# Patient Record
Sex: Male | Born: 1955
Health system: Southern US, Community
[De-identification: ages and names within clinical notes are randomized; demographics above are authoritative.]

## PROBLEM LIST (undated history)

## (undated) DIAGNOSIS — I723 Aneurysm of iliac artery: Secondary | ICD-10-CM

## (undated) DIAGNOSIS — K760 Fatty (change of) liver, not elsewhere classified: Secondary | ICD-10-CM

## (undated) DIAGNOSIS — R200 Anesthesia of skin: Secondary | ICD-10-CM

## (undated) DIAGNOSIS — R000 Tachycardia, unspecified: Secondary | ICD-10-CM

## (undated) DIAGNOSIS — K219 Gastro-esophageal reflux disease without esophagitis: Secondary | ICD-10-CM

## (undated) DIAGNOSIS — M069 Rheumatoid arthritis, unspecified: Secondary | ICD-10-CM

## (undated) DIAGNOSIS — M7120 Synovial cyst of popliteal space [Baker], unspecified knee: Secondary | ICD-10-CM

## (undated) DIAGNOSIS — T402X5A Adverse effect of other opioids, initial encounter: Secondary | ICD-10-CM

## (undated) DIAGNOSIS — G709 Myoneural disorder, unspecified: Secondary | ICD-10-CM

## (undated) DIAGNOSIS — I509 Heart failure, unspecified: Secondary | ICD-10-CM

## (undated) DIAGNOSIS — I739 Peripheral vascular disease, unspecified: Secondary | ICD-10-CM

## (undated) DIAGNOSIS — I255 Ischemic cardiomyopathy: Secondary | ICD-10-CM

## (undated) DIAGNOSIS — K5903 Drug induced constipation: Secondary | ICD-10-CM

## (undated) DIAGNOSIS — K5900 Constipation, unspecified: Secondary | ICD-10-CM

## (undated) DIAGNOSIS — I10 Essential (primary) hypertension: Secondary | ICD-10-CM

## (undated) DIAGNOSIS — I2510 Atherosclerotic heart disease of native coronary artery without angina pectoris: Secondary | ICD-10-CM

## (undated) DIAGNOSIS — I219 Acute myocardial infarction, unspecified: Secondary | ICD-10-CM

## (undated) DIAGNOSIS — E119 Type 2 diabetes mellitus without complications: Secondary | ICD-10-CM

## (undated) DIAGNOSIS — E785 Hyperlipidemia, unspecified: Secondary | ICD-10-CM

## (undated) DIAGNOSIS — M549 Dorsalgia, unspecified: Secondary | ICD-10-CM

## (undated) DIAGNOSIS — I5042 Chronic combined systolic (congestive) and diastolic (congestive) heart failure: Secondary | ICD-10-CM

## (undated) DIAGNOSIS — G8929 Other chronic pain: Secondary | ICD-10-CM

## (undated) DIAGNOSIS — R202 Paresthesia of skin: Secondary | ICD-10-CM

## (undated) HISTORY — DX: Essential (primary) hypertension: I10

## (undated) HISTORY — PX: WISDOM TOOTH EXTRACTION: SHX21

## (undated) HISTORY — PX: TONSILLECTOMY: SUR1361

## (undated) HISTORY — DX: Gastro-esophageal reflux disease without esophagitis: K21.9

## (undated) HISTORY — DX: Ischemic cardiomyopathy: I25.5

## (undated) HISTORY — PX: BACK SURGERY: SHX140

## (undated) HISTORY — PX: LUMBAR SPINE SURGERY: SHX701

## (undated) HISTORY — DX: Hyperlipidemia, unspecified: E78.5

## (undated) HISTORY — DX: Peripheral vascular disease, unspecified: I73.9

## (undated) HISTORY — PX: OTHER SURGICAL HISTORY: SHX169

## (undated) HISTORY — PX: POPLITEAL SYNOVIAL CYST EXCISION: SUR555

## (undated) HISTORY — PX: JOINT REPLACEMENT: SHX530

## (undated) SURGERY — Surgical Case
Anesthesia: *Unknown

---

## 2005-07-07 ENCOUNTER — Ambulatory Visit: Payer: Self-pay | Admitting: Family Medicine

## 2005-07-07 ENCOUNTER — Ambulatory Visit: Payer: Self-pay | Admitting: *Deleted

## 2008-01-03 ENCOUNTER — Emergency Department (HOSPITAL_COMMUNITY): Admission: EM | Admit: 2008-01-03 | Discharge: 2008-01-03 | Payer: Self-pay | Admitting: Emergency Medicine

## 2008-05-12 ENCOUNTER — Encounter (INDEPENDENT_AMBULATORY_CARE_PROVIDER_SITE_OTHER): Payer: Self-pay | Admitting: Adult Health

## 2008-05-12 ENCOUNTER — Ambulatory Visit: Payer: Self-pay | Admitting: Internal Medicine

## 2008-05-12 LAB — CONVERTED CEMR LAB
ALT: 30 units/L (ref 0–53)
AST: 24 units/L (ref 0–37)
Albumin: 4.9 g/dL (ref 3.5–5.2)
Alkaline Phosphatase: 56 units/L (ref 39–117)
BUN: 23 mg/dL (ref 6–23)
Basophils Absolute: 0 10*3/uL (ref 0.0–0.1)
Basophils Relative: 0 % (ref 0–1)
CO2: 24 meq/L (ref 19–32)
Calcium: 9.4 mg/dL (ref 8.4–10.5)
Chloride: 105 meq/L (ref 96–112)
Creatinine, Ser: 1.23 mg/dL (ref 0.40–1.50)
Eosinophils Absolute: 0.3 10*3/uL (ref 0.0–0.7)
Eosinophils Relative: 4 % (ref 0–5)
Glucose, Bld: 80 mg/dL (ref 70–99)
HCT: 41.4 % (ref 39.0–52.0)
Hemoglobin: 13.5 g/dL (ref 13.0–17.0)
Lymphocytes Relative: 35 % (ref 12–46)
Lymphs Abs: 2.3 10*3/uL (ref 0.7–4.0)
MCHC: 32.6 g/dL (ref 30.0–36.0)
MCV: 83.3 fL (ref 78.0–100.0)
Monocytes Absolute: 0.6 10*3/uL (ref 0.1–1.0)
Monocytes Relative: 8 % (ref 3–12)
Neutro Abs: 3.5 10*3/uL (ref 1.7–7.7)
Neutrophils Relative %: 52 % (ref 43–77)
PSA: 0.37 ng/mL (ref 0.10–4.00)
Platelets: 284 10*3/uL (ref 150–400)
Potassium: 3.9 meq/L (ref 3.5–5.3)
RBC: 4.97 M/uL (ref 4.22–5.81)
RDW: 15.4 % (ref 11.5–15.5)
Sodium: 140 meq/L (ref 135–145)
Total Bilirubin: 0.5 mg/dL (ref 0.3–1.2)
Total Protein: 7.4 g/dL (ref 6.0–8.3)
WBC: 6.7 10*3/uL (ref 4.0–10.5)

## 2008-05-18 ENCOUNTER — Encounter: Payer: Self-pay | Admitting: Internal Medicine

## 2008-05-18 ENCOUNTER — Ambulatory Visit: Payer: Self-pay | Admitting: Vascular Surgery

## 2008-05-18 ENCOUNTER — Ambulatory Visit (HOSPITAL_COMMUNITY): Admission: RE | Admit: 2008-05-18 | Discharge: 2008-05-18 | Payer: Self-pay | Admitting: Internal Medicine

## 2008-05-22 ENCOUNTER — Ambulatory Visit: Payer: Self-pay | Admitting: Internal Medicine

## 2008-06-12 ENCOUNTER — Ambulatory Visit: Payer: Self-pay | Admitting: Internal Medicine

## 2008-06-12 ENCOUNTER — Ambulatory Visit: Payer: Self-pay | Admitting: Surgery

## 2008-06-19 ENCOUNTER — Ambulatory Visit: Payer: Self-pay | Admitting: Surgery

## 2008-07-03 ENCOUNTER — Ambulatory Visit: Payer: Self-pay | Admitting: Internal Medicine

## 2008-07-25 ENCOUNTER — Ambulatory Visit: Payer: Self-pay | Admitting: Family Medicine

## 2008-07-31 ENCOUNTER — Ambulatory Visit (HOSPITAL_COMMUNITY): Admission: RE | Admit: 2008-07-31 | Discharge: 2008-07-31 | Payer: Self-pay | Admitting: Internal Medicine

## 2008-08-18 ENCOUNTER — Ambulatory Visit: Payer: Self-pay | Admitting: Internal Medicine

## 2008-08-24 ENCOUNTER — Ambulatory Visit (HOSPITAL_COMMUNITY): Admission: RE | Admit: 2008-08-24 | Discharge: 2008-08-24 | Payer: Self-pay | Admitting: Internal Medicine

## 2008-08-30 ENCOUNTER — Ambulatory Visit: Payer: Self-pay | Admitting: Internal Medicine

## 2008-09-19 ENCOUNTER — Emergency Department (HOSPITAL_COMMUNITY): Admission: EM | Admit: 2008-09-19 | Discharge: 2008-09-19 | Payer: Self-pay | Admitting: Emergency Medicine

## 2008-10-13 ENCOUNTER — Emergency Department (HOSPITAL_COMMUNITY): Admission: EM | Admit: 2008-10-13 | Discharge: 2008-10-13 | Payer: Self-pay | Admitting: Emergency Medicine

## 2008-11-02 ENCOUNTER — Emergency Department (HOSPITAL_COMMUNITY): Admission: EM | Admit: 2008-11-02 | Discharge: 2008-11-02 | Payer: Self-pay | Admitting: Emergency Medicine

## 2009-03-05 ENCOUNTER — Ambulatory Visit: Payer: Self-pay | Admitting: Internal Medicine

## 2009-03-05 ENCOUNTER — Inpatient Hospital Stay (HOSPITAL_COMMUNITY): Admission: RE | Admit: 2009-03-05 | Discharge: 2009-03-09 | Payer: Self-pay | Admitting: Neurosurgery

## 2009-03-07 ENCOUNTER — Encounter: Payer: Self-pay | Admitting: Internal Medicine

## 2009-03-09 ENCOUNTER — Encounter (INDEPENDENT_AMBULATORY_CARE_PROVIDER_SITE_OTHER): Payer: Self-pay | Admitting: Neurosurgery

## 2009-03-11 ENCOUNTER — Emergency Department (HOSPITAL_COMMUNITY): Admission: EM | Admit: 2009-03-11 | Discharge: 2009-03-11 | Payer: Self-pay | Admitting: Emergency Medicine

## 2009-05-09 ENCOUNTER — Encounter: Admission: RE | Admit: 2009-05-09 | Discharge: 2009-07-16 | Payer: Self-pay | Admitting: Neurosurgery

## 2009-06-13 ENCOUNTER — Encounter: Admission: RE | Admit: 2009-06-13 | Discharge: 2009-06-13 | Payer: Self-pay | Admitting: Neurosurgery

## 2009-06-25 ENCOUNTER — Ambulatory Visit (HOSPITAL_COMMUNITY): Admission: RE | Admit: 2009-06-25 | Discharge: 2009-06-25 | Payer: Self-pay | Admitting: Neurosurgery

## 2009-06-25 ENCOUNTER — Encounter (INDEPENDENT_AMBULATORY_CARE_PROVIDER_SITE_OTHER): Payer: Self-pay | Admitting: Neurosurgery

## 2009-06-25 ENCOUNTER — Ambulatory Visit: Payer: Self-pay | Admitting: Vascular Surgery

## 2009-07-12 ENCOUNTER — Ambulatory Visit (HOSPITAL_COMMUNITY)
Admission: RE | Admit: 2009-07-12 | Discharge: 2009-07-14 | Payer: Self-pay | Source: Home / Self Care | Admitting: Neurosurgery

## 2009-07-13 ENCOUNTER — Ambulatory Visit: Payer: Self-pay | Admitting: Infectious Diseases

## 2009-08-07 ENCOUNTER — Ambulatory Visit: Payer: Self-pay | Admitting: Internal Medicine

## 2009-08-07 ENCOUNTER — Encounter (INDEPENDENT_AMBULATORY_CARE_PROVIDER_SITE_OTHER): Payer: Self-pay | Admitting: Adult Health

## 2009-08-07 LAB — CONVERTED CEMR LAB: Microalb, Ur: 0.5 mg/dL (ref 0.00–1.89)

## 2009-08-31 ENCOUNTER — Encounter (INDEPENDENT_AMBULATORY_CARE_PROVIDER_SITE_OTHER): Payer: Self-pay | Admitting: Adult Health

## 2009-08-31 ENCOUNTER — Ambulatory Visit: Payer: Self-pay | Admitting: Internal Medicine

## 2009-08-31 LAB — CONVERTED CEMR LAB
ALT: 20 units/L (ref 0–53)
AST: 24 units/L (ref 0–37)
Albumin: 5 g/dL (ref 3.5–5.2)
Alkaline Phosphatase: 72 units/L (ref 39–117)
BUN: 14 mg/dL (ref 6–23)
CO2: 20 meq/L (ref 19–32)
Calcium: 9.5 mg/dL (ref 8.4–10.5)
Chloride: 106 meq/L (ref 96–112)
Cholesterol: 200 mg/dL (ref 0–200)
Creatinine, Ser: 0.95 mg/dL (ref 0.40–1.50)
Glucose, Bld: 126 mg/dL — ABNORMAL HIGH (ref 70–99)
HDL: 47 mg/dL (ref >39)
LDL Cholesterol: 129 mg/dL — ABNORMAL HIGH (ref 0–99)
PSA: 0.37 ng/mL (ref 0.10–4.00)
Potassium: 4.1 meq/L (ref 3.5–5.3)
Sodium: 139 meq/L (ref 135–145)
Total Bilirubin: 0.4 mg/dL (ref 0.3–1.2)
Total CHOL/HDL Ratio: 4.3
Total Protein: 7.5 g/dL (ref 6.0–8.3)
Triglycerides: 122 mg/dL (ref <150)
VLDL: 24 mg/dL (ref 0–40)

## 2010-01-28 ENCOUNTER — Encounter
Admission: RE | Admit: 2010-01-28 | Discharge: 2010-03-26 | Payer: Self-pay | Source: Home / Self Care | Attending: Physical Medicine & Rehabilitation | Admitting: Physical Medicine & Rehabilitation

## 2010-01-28 ENCOUNTER — Ambulatory Visit: Payer: Self-pay | Admitting: Physical Medicine & Rehabilitation

## 2010-02-19 ENCOUNTER — Ambulatory Visit (HOSPITAL_COMMUNITY)
Admission: RE | Admit: 2010-02-19 | Discharge: 2010-02-19 | Payer: Self-pay | Source: Home / Self Care | Attending: Physical Medicine & Rehabilitation | Admitting: Physical Medicine & Rehabilitation

## 2010-02-26 ENCOUNTER — Encounter
Admission: RE | Admit: 2010-02-26 | Discharge: 2010-03-26 | Payer: Self-pay | Source: Home / Self Care | Attending: Physical Medicine & Rehabilitation | Admitting: Physical Medicine & Rehabilitation

## 2010-03-17 ENCOUNTER — Encounter: Payer: Self-pay | Admitting: Physical Medicine & Rehabilitation

## 2010-03-25 ENCOUNTER — Ambulatory Visit: Admit: 2010-03-25 | Payer: Self-pay | Admitting: Physical Medicine & Rehabilitation

## 2010-04-08 ENCOUNTER — Encounter (INDEPENDENT_AMBULATORY_CARE_PROVIDER_SITE_OTHER): Payer: Self-pay | Admitting: *Deleted

## 2010-04-08 LAB — CONVERTED CEMR LAB
ALT: 20 units/L (ref 0–53)
AST: 23 units/L (ref 0–37)
Albumin: 4.5 g/dL (ref 3.5–5.2)
Alkaline Phosphatase: 86 units/L (ref 39–117)
BUN: 19 mg/dL (ref 6–23)
Basophils Absolute: 0 10*3/uL (ref 0.0–0.1)
Basophils Relative: 0 % (ref 0–1)
CO2: 22 meq/L (ref 19–32)
Calcium: 9.4 mg/dL (ref 8.4–10.5)
Chloride: 104 meq/L (ref 96–112)
Creatinine, Ser: 1.24 mg/dL (ref 0.40–1.50)
Eosinophils Absolute: 0.3 10*3/uL (ref 0.0–0.7)
Eosinophils Relative: 3 % (ref 0–5)
Glucose, Bld: 86 mg/dL (ref 70–99)
HCT: 45 % (ref 39.0–52.0)
Hemoglobin: 15.2 g/dL (ref 13.0–17.0)
Hgb A1c MFr Bld: 6.8 % — ABNORMAL HIGH (ref <5.7)
Lymphocytes Relative: 29 % (ref 12–46)
Lymphs Abs: 2.6 10*3/uL (ref 0.7–4.0)
MCHC: 33.8 g/dL (ref 30.0–36.0)
MCV: 80.5 fL (ref 78.0–100.0)
Monocytes Absolute: 0.8 10*3/uL (ref 0.1–1.0)
Monocytes Relative: 9 % (ref 3–12)
Neutro Abs: 5.3 10*3/uL (ref 1.7–7.7)
Neutrophils Relative %: 60 % (ref 43–77)
Platelets: 312 10*3/uL (ref 150–400)
Potassium: 3.8 meq/L (ref 3.5–5.3)
RBC: 5.59 M/uL (ref 4.22–5.81)
RDW: 17 % — ABNORMAL HIGH (ref 11.5–15.5)
Sodium: 140 meq/L (ref 135–145)
TSH: 0.723 microintl units/mL (ref 0.350–4.500)
Total Bilirubin: 0.4 mg/dL (ref 0.3–1.2)
Total Protein: 7.1 g/dL (ref 6.0–8.3)
Vitamin B-12: 542 pg/mL (ref 211–911)
WBC: 8.9 10*3/uL (ref 4.0–10.5)

## 2010-04-24 ENCOUNTER — Ambulatory Visit: Payer: Self-pay

## 2010-05-12 LAB — TSH: TSH: 0.105 u[IU]/mL — ABNORMAL LOW (ref 0.350–4.500)

## 2010-05-12 LAB — CBC
HCT: 27.5 % — ABNORMAL LOW (ref 39.0–52.0)
HCT: 29.4 % — ABNORMAL LOW (ref 39.0–52.0)
HCT: 33 % — ABNORMAL LOW (ref 39.0–52.0)
HCT: 42.5 % (ref 39.0–52.0)
Hemoglobin: 10 g/dL — ABNORMAL LOW (ref 13.0–17.0)
Hemoglobin: 11.2 g/dL — ABNORMAL LOW (ref 13.0–17.0)
Hemoglobin: 14.4 g/dL (ref 13.0–17.0)
Hemoglobin: 9.3 g/dL — ABNORMAL LOW (ref 13.0–17.0)
MCHC: 33.8 g/dL (ref 30.0–36.0)
MCHC: 33.8 g/dL (ref 30.0–36.0)
MCHC: 33.9 g/dL (ref 30.0–36.0)
MCHC: 34 g/dL (ref 30.0–36.0)
MCV: 83.9 fL (ref 78.0–100.0)
MCV: 84.5 fL (ref 78.0–100.0)
MCV: 84.6 fL (ref 78.0–100.0)
MCV: 84.7 fL (ref 78.0–100.0)
Platelets: 164 10*3/uL (ref 150–400)
Platelets: 174 10*3/uL (ref 150–400)
Platelets: 199 10*3/uL (ref 150–400)
Platelets: 269 10*3/uL (ref 150–400)
RBC: 3.24 MIL/uL — ABNORMAL LOW (ref 4.22–5.81)
RBC: 3.5 MIL/uL — ABNORMAL LOW (ref 4.22–5.81)
RBC: 3.9 MIL/uL — ABNORMAL LOW (ref 4.22–5.81)
RBC: 5.03 MIL/uL (ref 4.22–5.81)
RDW: 15.3 % (ref 11.5–15.5)
RDW: 15.3 % (ref 11.5–15.5)
RDW: 15.5 % (ref 11.5–15.5)
RDW: 15.6 % — ABNORMAL HIGH (ref 11.5–15.5)
WBC: 11.1 10*3/uL — ABNORMAL HIGH (ref 4.0–10.5)
WBC: 13.9 10*3/uL — ABNORMAL HIGH (ref 4.0–10.5)
WBC: 15.7 10*3/uL — ABNORMAL HIGH (ref 4.0–10.5)
WBC: 9.8 10*3/uL (ref 4.0–10.5)

## 2010-05-12 LAB — TYPE AND SCREEN
ABO/RH(D): B POS
Antibody Screen: NEGATIVE

## 2010-05-12 LAB — CARDIAC PANEL(CRET KIN+CKTOT+MB+TROPI)
CK, MB: 11.2 ng/mL (ref 0.3–4.0)
CK, MB: 14.1 ng/mL (ref 0.3–4.0)
CK, MB: 18.5 ng/mL (ref 0.3–4.0)
CK, MB: 18.9 ng/mL (ref 0.3–4.0)
CK, MB: 5.1 ng/mL — ABNORMAL HIGH (ref 0.3–4.0)
Relative Index: 0.2 (ref 0.0–2.5)
Relative Index: 0.3 (ref 0.0–2.5)
Relative Index: 0.3 (ref 0.0–2.5)
Relative Index: 0.4 (ref 0.0–2.5)
Relative Index: 0.5 (ref 0.0–2.5)
Total CK: 3003 U/L — ABNORMAL HIGH (ref 7–232)
Total CK: 3538 U/L — ABNORMAL HIGH (ref 7–232)
Total CK: 4158 U/L — ABNORMAL HIGH (ref 7–232)
Total CK: 4259 U/L — ABNORMAL HIGH (ref 7–232)
Total CK: 4365 U/L — ABNORMAL HIGH (ref 7–232)
Troponin I: 0.03 ng/mL (ref 0.00–0.06)
Troponin I: 0.04 ng/mL (ref 0.00–0.06)
Troponin I: 0.06 ng/mL (ref 0.00–0.06)
Troponin I: 0.08 ng/mL — ABNORMAL HIGH (ref 0.00–0.06)
Troponin I: 0.09 ng/mL — ABNORMAL HIGH (ref 0.00–0.06)

## 2010-05-12 LAB — BASIC METABOLIC PANEL
BUN: 11 mg/dL (ref 6–23)
BUN: 14 mg/dL (ref 6–23)
BUN: 14 mg/dL (ref 6–23)
BUN: 6 mg/dL (ref 6–23)
CO2: 24 mEq/L (ref 19–32)
CO2: 28 mEq/L (ref 19–32)
CO2: 31 mEq/L (ref 19–32)
CO2: 31 mEq/L (ref 19–32)
Calcium: 10 mg/dL (ref 8.4–10.5)
Calcium: 8.4 mg/dL (ref 8.4–10.5)
Calcium: 8.4 mg/dL (ref 8.4–10.5)
Calcium: 8.8 mg/dL (ref 8.4–10.5)
Chloride: 100 mEq/L (ref 96–112)
Chloride: 101 mEq/L (ref 96–112)
Chloride: 103 mEq/L (ref 96–112)
Chloride: 104 mEq/L (ref 96–112)
Creatinine, Ser: 1.02 mg/dL (ref 0.4–1.5)
Creatinine, Ser: 1.07 mg/dL (ref 0.4–1.5)
Creatinine, Ser: 1.36 mg/dL (ref 0.4–1.5)
Creatinine, Ser: 1.49 mg/dL (ref 0.4–1.5)
GFR calc Af Amer: 60 mL/min — ABNORMAL LOW (ref >60)
GFR calc Af Amer: >60 mL/min (ref >60)
GFR calc Af Amer: >60 mL/min (ref >60)
GFR calc Af Amer: >60 mL/min (ref >60)
GFR calc non Af Amer: 49 mL/min — ABNORMAL LOW (ref >60)
GFR calc non Af Amer: 55 mL/min — ABNORMAL LOW (ref >60)
GFR calc non Af Amer: >60 mL/min (ref >60)
GFR calc non Af Amer: >60 mL/min (ref >60)
Glucose, Bld: 116 mg/dL — ABNORMAL HIGH (ref 70–99)
Glucose, Bld: 127 mg/dL — ABNORMAL HIGH (ref 70–99)
Glucose, Bld: 150 mg/dL — ABNORMAL HIGH (ref 70–99)
Glucose, Bld: 153 mg/dL — ABNORMAL HIGH (ref 70–99)
Potassium: 4 mEq/L (ref 3.5–5.1)
Potassium: 4.2 mEq/L (ref 3.5–5.1)
Potassium: 4.4 mEq/L (ref 3.5–5.1)
Potassium: 4.4 mEq/L (ref 3.5–5.1)
Sodium: 137 mEq/L (ref 135–145)
Sodium: 137 mEq/L (ref 135–145)
Sodium: 138 mEq/L (ref 135–145)
Sodium: 139 mEq/L (ref 135–145)

## 2010-05-12 LAB — DIFFERENTIAL
Basophils Absolute: 0 10*3/uL (ref 0.0–0.1)
Basophils Relative: 0 % (ref 0–1)
Eosinophils Absolute: 0.2 10*3/uL (ref 0.0–0.7)
Eosinophils Relative: 1 % (ref 0–5)
Lymphocytes Relative: 8 % — ABNORMAL LOW (ref 12–46)
Lymphs Abs: 1 10*3/uL (ref 0.7–4.0)
Monocytes Absolute: 0.9 10*3/uL (ref 0.1–1.0)
Monocytes Relative: 7 % (ref 3–12)
Neutro Abs: 11.8 10*3/uL — ABNORMAL HIGH (ref 1.7–7.7)
Neutrophils Relative %: 85 % — ABNORMAL HIGH (ref 43–77)

## 2010-05-12 LAB — URINALYSIS, ROUTINE W REFLEX MICROSCOPIC
Bilirubin Urine: NEGATIVE
Glucose, UA: NEGATIVE mg/dL
Hgb urine dipstick: NEGATIVE
Ketones, ur: NEGATIVE mg/dL
Nitrite: NEGATIVE
Protein, ur: NEGATIVE mg/dL
Specific Gravity, Urine: 1.014 (ref 1.005–1.030)
Urobilinogen, UA: 1 mg/dL (ref 0.0–1.0)
pH: 8 (ref 5.0–8.0)

## 2010-05-12 LAB — T4, FREE: Free T4: 1.25 ng/dL (ref 0.80–1.80)

## 2010-05-12 LAB — ABO/RH: ABO/RH(D): B POS

## 2010-05-13 LAB — ANAEROBIC CULTURE

## 2010-05-13 LAB — WOUND CULTURE: Culture: NO GROWTH

## 2010-05-13 LAB — SEDIMENTATION RATE: Sed Rate: 6 mm/hr (ref 0–16)

## 2010-05-14 LAB — CBC
HCT: 41.6 % (ref 39.0–52.0)
Hemoglobin: 13.9 g/dL (ref 13.0–17.0)
MCHC: 33.4 g/dL (ref 30.0–36.0)
MCV: 80.6 fL (ref 78.0–100.0)
Platelets: 279 10*3/uL (ref 150–400)
RBC: 5.17 MIL/uL (ref 4.22–5.81)
RDW: 17 % — ABNORMAL HIGH (ref 11.5–15.5)
WBC: 8.9 10*3/uL (ref 4.0–10.5)

## 2010-05-14 LAB — BASIC METABOLIC PANEL
BUN: 16 mg/dL (ref 6–23)
CO2: 24 mEq/L (ref 19–32)
Calcium: 10.2 mg/dL (ref 8.4–10.5)
Chloride: 105 mEq/L (ref 96–112)
Creatinine, Ser: 1.02 mg/dL (ref 0.4–1.5)
GFR calc Af Amer: >60 mL/min (ref >60)
GFR calc non Af Amer: >60 mL/min (ref >60)
Glucose, Bld: 110 mg/dL — ABNORMAL HIGH (ref 70–99)
Potassium: 4.1 mEq/L (ref 3.5–5.1)
Sodium: 138 mEq/L (ref 135–145)

## 2010-05-14 LAB — SURGICAL PCR SCREEN
MRSA, PCR: NEGATIVE
Staphylococcus aureus: NEGATIVE

## 2010-05-21 ENCOUNTER — Ambulatory Visit: Payer: Medicaid Other | Admitting: Physical Medicine & Rehabilitation

## 2010-05-21 ENCOUNTER — Encounter: Payer: Medicaid Other | Attending: Physical Medicine & Rehabilitation

## 2010-05-22 ENCOUNTER — Encounter: Payer: Medicaid Other | Attending: Family Medicine

## 2010-05-22 DIAGNOSIS — E119 Type 2 diabetes mellitus without complications: Secondary | ICD-10-CM | POA: Insufficient documentation

## 2010-05-22 DIAGNOSIS — Z713 Dietary counseling and surveillance: Secondary | ICD-10-CM | POA: Insufficient documentation

## 2010-05-28 ENCOUNTER — Ambulatory Visit: Payer: Self-pay | Admitting: Physical Medicine & Rehabilitation

## 2010-06-02 LAB — URINALYSIS, ROUTINE W REFLEX MICROSCOPIC
Bilirubin Urine: NEGATIVE
Glucose, UA: NEGATIVE mg/dL
Ketones, ur: NEGATIVE mg/dL
Leukocytes, UA: NEGATIVE
Nitrite: NEGATIVE
Protein, ur: NEGATIVE mg/dL
Specific Gravity, Urine: 1.022 (ref 1.005–1.030)
Urobilinogen, UA: 0.2 mg/dL (ref 0.0–1.0)
pH: 6 (ref 5.0–8.0)

## 2010-06-02 LAB — URINE MICROSCOPIC-ADD ON

## 2010-06-06 ENCOUNTER — Encounter: Payer: Medicaid Other | Attending: Family Medicine

## 2010-06-06 DIAGNOSIS — E119 Type 2 diabetes mellitus without complications: Secondary | ICD-10-CM | POA: Insufficient documentation

## 2010-06-06 DIAGNOSIS — Z713 Dietary counseling and surveillance: Secondary | ICD-10-CM | POA: Insufficient documentation

## 2010-06-13 ENCOUNTER — Ambulatory Visit: Payer: Medicaid Other

## 2010-06-21 ENCOUNTER — Ambulatory Visit: Payer: Medicaid Other | Admitting: Physical Medicine & Rehabilitation

## 2010-06-28 ENCOUNTER — Ambulatory Visit: Payer: Medicaid Other | Admitting: Physical Medicine & Rehabilitation

## 2010-06-28 ENCOUNTER — Encounter: Payer: Medicaid Other | Attending: Physical Medicine & Rehabilitation

## 2010-07-09 NOTE — Assessment & Plan Note (Signed)
OFFICE VISIT   Thomas Mcclure, Thomas Mcclure  DOB:  Jan 10, 1956                                       06/12/2008  ZOXWR#:[id number]   REASON FOR VISIT:  Leg pain.   HISTORY:  This is a 55 year old gentleman I am seeing at the request of  Dr. Reche Dixon, at [facility], for evaluation of bilateral lower extremity  leg pain.  The patient states that he suffers from severe bilateral leg  pain that begins on the side of his legs and extends down across his  ankles.  This has been going on for approximately 1 year and has gotten  worse since November.  There is nothing that alleviates his pain, it is  made worse with walking.  He is now unable to work due to his pain.  The  patient's risk factors for arterial insufficiency include hypertension  as well as a history of smoking.   REVIEW OF SYSTEMS:  GENERAL:  Negative for fevers, chills, weight gain,  weight loss.  CARDIAC:  Negative.  PULMONARY:  Negative.  GI:  Positive for reflux.  GU:  Negative.  VASCULAR:  As above.  NEURO:  Negative.  ORTHO:  Positive for joint pain, muscle pain.  PSYCH:  Negative.  ENT:  Negative.  HEME:  Negative.   PAST MEDICAL HISTORY:  Hypertension.   FAMILY HISTORY:  Positive for cardiovascular disease in his mother.   SOCIAL HISTORY:  He is married with 2 children.  He works as a [employer] manager.  Currently smokes about a pack a week, drinks about  a 6-pack a week.   MEDICATIONS:  Hydrochlorothiazide, lisinopril, AcipHex, aspirin and  tramadol.   ALLERGIES:  None.   PHYSICAL EXAMINATION:  Blood pressure is 128/80, pulse is 72.  General:  He is well-appearing, in no distress.  HEENT:  Normocephalic,  atraumatic.  Neck:  Supple, no JVD, no carotid bruits.  Cardiovascular:  Regular rate and rhythm, no murmurs, rubs or gallops.  Pulmonary:  Lungs  are clear bilaterally.  Extremities:  Warm and well perfused.  There are  no ulcerations.  He has palpable right dorsalis pedis and  right  posterior tibial and left posterior tibial pulses.   DIAGNOSTIC STUDIES:  I have reviewed his ultrasound from March 25th at  [facility].  This reveals an ankle brachial index of 1.2 on the right and 1.1  on the left.   ASSESSMENT/PLAN:  Bilateral leg pain.  Plan:  The patient does not have arterial insufficiency.  His symptoms  do not correlate with claudication.  There are some irregular findings  on his ultrasound; however, with palpable pedal pulses and ankle  brachial index greater than 1, arterial insufficiency is not the  etiology of his leg pain.  I have asked him to go back to see his  primary care physician to be evaluated for other potential causes.   Jorge Ny, MD  Electronically Signed   VWB/MEDQ  D:  06/12/2008  T:  06/13/2008  Job:  [id number]   cc:   Dr. Reche Dixon

## 2010-10-07 ENCOUNTER — Emergency Department (HOSPITAL_COMMUNITY)
Admission: EM | Admit: 2010-10-07 | Discharge: 2010-10-07 | Disposition: A | Discharge status: Home / Self Care | Payer: Medicare Other | Attending: Emergency Medicine | Admitting: Emergency Medicine

## 2010-10-07 ENCOUNTER — Encounter (HOSPITAL_COMMUNITY): Payer: Self-pay | Admitting: Radiology

## 2010-10-07 ENCOUNTER — Emergency Department (HOSPITAL_COMMUNITY): Payer: Medicare Other

## 2010-10-07 DIAGNOSIS — R229 Localized swelling, mass and lump, unspecified: Secondary | ICD-10-CM | POA: Insufficient documentation

## 2010-10-07 DIAGNOSIS — L03211 Cellulitis of face: Secondary | ICD-10-CM | POA: Insufficient documentation

## 2010-10-07 DIAGNOSIS — L738 Other specified follicular disorders: Secondary | ICD-10-CM | POA: Insufficient documentation

## 2010-10-07 DIAGNOSIS — R51 Headache: Secondary | ICD-10-CM | POA: Insufficient documentation

## 2010-10-07 DIAGNOSIS — I10 Essential (primary) hypertension: Secondary | ICD-10-CM | POA: Insufficient documentation

## 2010-10-07 DIAGNOSIS — M542 Cervicalgia: Secondary | ICD-10-CM | POA: Insufficient documentation

## 2010-10-07 DIAGNOSIS — L0201 Cutaneous abscess of face: Secondary | ICD-10-CM | POA: Insufficient documentation

## 2010-10-07 DIAGNOSIS — R6884 Jaw pain: Secondary | ICD-10-CM | POA: Insufficient documentation

## 2010-10-07 LAB — POCT I-STAT, CHEM 8
BUN: 13 mg/dL (ref 6–23)
Calcium, Ion: 1.19 mmol/L (ref 1.12–1.32)
Chloride: 104 mEq/L (ref 96–112)
Creatinine, Ser: 1.1 mg/dL (ref 0.50–1.35)
Glucose, Bld: 102 mg/dL — ABNORMAL HIGH (ref 70–99)
HCT: 43 % (ref 39.0–52.0)
Hemoglobin: 14.6 g/dL (ref 13.0–17.0)
Potassium: 3.5 mEq/L (ref 3.5–5.1)
Sodium: 140 mEq/L (ref 135–145)
TCO2: 25 mmol/L (ref 0–100)

## 2010-10-07 MED ORDER — IOHEXOL 300 MG/ML  SOLN: 100.0000 mL | Freq: Once | INTRAMUSCULAR | Status: DC | PRN

## 2010-11-26 LAB — GLUCOSE, CAPILLARY: Glucose-Capillary: 92

## 2011-03-12 ENCOUNTER — Other Ambulatory Visit (HOSPITAL_COMMUNITY): Payer: Self-pay | Admitting: Neurosurgery

## 2011-03-12 DIAGNOSIS — M5410 Radiculopathy, site unspecified: Secondary | ICD-10-CM

## 2011-03-12 DIAGNOSIS — M549 Dorsalgia, unspecified: Secondary | ICD-10-CM

## 2011-03-18 ENCOUNTER — Ambulatory Visit (HOSPITAL_COMMUNITY)
Admission: RE | Admit: 2011-03-18 | Discharge: 2011-03-18 | Disposition: A | Discharge status: Home / Self Care | Payer: Medicare Other | Source: Ambulatory Visit | Attending: Neurosurgery | Admitting: Neurosurgery

## 2011-03-18 DIAGNOSIS — M48061 Spinal stenosis, lumbar region without neurogenic claudication: Secondary | ICD-10-CM | POA: Insufficient documentation

## 2011-03-18 DIAGNOSIS — M5410 Radiculopathy, site unspecified: Secondary | ICD-10-CM

## 2011-03-18 DIAGNOSIS — M549 Dorsalgia, unspecified: Secondary | ICD-10-CM

## 2011-03-18 DIAGNOSIS — M79609 Pain in unspecified limb: Secondary | ICD-10-CM | POA: Insufficient documentation

## 2011-03-18 LAB — BUN: BUN: 15 mg/dL (ref 6–23)

## 2011-03-18 LAB — CREATININE, SERUM
Creatinine, Ser: 1.08 mg/dL (ref 0.50–1.35)
GFR calc Af Amer: 87 mL/min — ABNORMAL LOW (ref >90)
GFR calc non Af Amer: 75 mL/min — ABNORMAL LOW (ref >90)

## 2011-03-18 MED ORDER — GADOBENATE DIMEGLUMINE 529 MG/ML IV SOLN
20.0000 mL | Freq: Once | INTRAVENOUS | Status: AC | PRN
  Administered 2011-03-18: 20 mL via INTRAVENOUS

## 2011-04-25 ENCOUNTER — Other Ambulatory Visit (HOSPITAL_COMMUNITY): Payer: Self-pay | Admitting: Neurosurgery

## 2011-04-25 ENCOUNTER — Other Ambulatory Visit: Payer: Self-pay | Admitting: Neurosurgery

## 2011-04-25 DIAGNOSIS — M5416 Radiculopathy, lumbar region: Secondary | ICD-10-CM

## 2011-05-16 ENCOUNTER — Ambulatory Visit (HOSPITAL_COMMUNITY)
Admission: RE | Admit: 2011-05-16 | Discharge: 2011-05-16 | Disposition: A | Discharge status: Home / Self Care | Payer: Medicare Other | Source: Ambulatory Visit | Attending: Neurosurgery | Admitting: Neurosurgery

## 2011-05-16 DIAGNOSIS — Y838 Other surgical procedures as the cause of abnormal reaction of the patient, or of later complication, without mention of misadventure at the time of the procedure: Secondary | ICD-10-CM | POA: Insufficient documentation

## 2011-05-16 DIAGNOSIS — M5416 Radiculopathy, lumbar region: Secondary | ICD-10-CM

## 2011-05-16 DIAGNOSIS — M48061 Spinal stenosis, lumbar region without neurogenic claudication: Secondary | ICD-10-CM | POA: Insufficient documentation

## 2011-05-16 DIAGNOSIS — T84498A Other mechanical complication of other internal orthopedic devices, implants and grafts, initial encounter: Secondary | ICD-10-CM | POA: Insufficient documentation

## 2011-05-16 MED ORDER — MORPHINE SULFATE 4 MG/ML IJ SOLN: 2.0000 mg | INTRAMUSCULAR | Status: DC | PRN

## 2011-05-16 MED ORDER — IOHEXOL 180 MG/ML  SOLN
20.0000 mL | Freq: Once | INTRAMUSCULAR | Status: AC | PRN
  Administered 2011-05-16: 20 mL via INTRATHECAL

## 2011-05-16 MED ORDER — OXYCODONE-ACETAMINOPHEN 5-325 MG PO TABS
1.0000 | ORAL_TABLET | ORAL | Status: DC | PRN
  Administered 2011-05-16: 2 via ORAL

## 2011-05-16 MED ORDER — DIAZEPAM 5 MG PO TABS
ORAL_TABLET | ORAL | Status: AC
  Filled 2011-05-16: qty 2

## 2011-05-16 MED ORDER — DIAZEPAM 5 MG PO TABS
10.0000 mg | ORAL_TABLET | Freq: Once | ORAL | Status: AC
  Administered 2011-05-16: 10 mg via ORAL

## 2011-05-16 MED ORDER — ONDANSETRON HCL 4 MG/2ML IJ SOLN: 4.0000 mg | Freq: Four times a day (QID) | INTRAMUSCULAR | Status: DC | PRN

## 2011-05-16 MED ORDER — OXYCODONE-ACETAMINOPHEN 5-325 MG PO TABS
ORAL_TABLET | ORAL | Status: AC
  Administered 2011-05-16: 2 via ORAL
  Filled 2011-05-16: qty 2

## 2011-05-16 NOTE — Op Note (Signed)
Brief history: The patient complains of back pain.  Procedure: Lumbar myelogram  Surgeon: Dr. Jeff Boyce Keltner  Asst.: None  Level injected:L5/S1  Dye injected: 15 cc of Omnipaque 180  CSF appearance: Clear  Estimated blood loss minimal  Complications: None    

## 2011-05-16 NOTE — Discharge Instructions (Signed)
Myelography Myelography is an X-ray test that uses a dye to look at your spine or neck. This test is usually done to look for:  Back pain.   Neck pain.   Arm or leg pain, weakness, or numbness.  BEFORE THE PROCEDURE  Take your medicine as told by your doctor.   Eat food and drink fluids as told by your doctor.   Do not drive. Have someone else drive you to the test and drive you home.   Your doctor will talk to you about risks of the myelography and answer questions you may have.  PROCEDURE  You will be awake during the procedure.   You may be asked to lie on your stomach during the procedure.   Your back will be cleaned.   A numbing medicine will be injected into that area.   A small needle is inserted through the skin that was numbed. A dye will be put into the needle.   The needle will be taken out.   X-ray pictures will be taken of your neck or back.   You may also have more procedures to get different views of your neck or back.  AFTER THE PROCEDURE  You will rest in a recovery room until you are stable and doing well.   You will lie down with your head resting on 1 pillow.   You may be given something to eat or drink.   Someone will need to drive you home.   Finding out the results of your test Ask when your test results will be ready. Make sure you get your test results.  Document Released: 11/20/2007 Document Revised: 01/30/2011 Document Reviewed: 11/20/2007 [employer] Patient Information 2012 [employer]. 

## 2011-05-16 NOTE — H&P (Signed)
Subjective: The patient is a 56 year old male who has had a previous [id number] decompression instrumentation and fusion. The patient has developed recurrent back buttocks and leg pain. I discussed further workup with a lumbar myelo CT and the patient elected she with that study.   Past Medical History  Diagnosis Date  . Diabetes mellitus     No past surgical history on file.  Allergies  Allergen Reactions  . Adhesive (Tape) Rash and Other (See Comments)    "blisters"    History  Substance Use Topics  . Smoking status: Not on file  . Smokeless tobacco: Not on file  . Alcohol Use: Not on file    No family history on file. Prior to Admission medications   Medication Sig Start Date End Date Taking? Authorizing Provider  aspirin EC 81 MG tablet Take 81 mg by mouth daily.    Historical Provider, MD  cyclobenzaprine (FLEXERIL) 10 MG tablet Take 10 mg by mouth 3 (three) times daily as needed. For muscle spasm    Historical Provider, MD  diazepam (VALIUM) 5 MG tablet Take 5 mg by mouth 2 (two) times daily.    Historical Provider, MD  DULoxetine (CYMBALTA) 60 MG capsule Take 60 mg by mouth daily.    Historical Provider, MD  hydrochlorothiazide (HYDRODIURIL) 25 MG tablet Take 25 mg by mouth daily.    Historical Provider, MD  lisinopril (PRINIVIL,ZESTRIL) 20 MG tablet Take 20 mg by mouth daily.    Historical Provider, MD  naproxen (NAPROSYN) 500 MG tablet Take 500 mg by mouth 2 (two) times daily with a meal.    Historical Provider, MD  omeprazole (PRILOSEC) 40 MG capsule Take 40 mg by mouth daily.    Historical Provider, MD  pregabalin (LYRICA) 100 MG capsule Take 100 mg by mouth 3 (three) times daily.    Historical Provider, MD  topiramate (TOPAMAX) 50 MG tablet Take 50 mg by mouth 2 (two) times daily as needed. For nerve pain    Historical Provider, MD     Review of Systems  Positive ROS: As above  All other systems have been reviewed and were otherwise negative with the exception of those  mentioned in the HPI and as above.  Objective: Vital signs in last 24 hours: Temp:  [97 F (36.1 C)] 97 F (36.1 C) (03/22 0601) Pulse Rate:  [82] 82  (03/22 0601) Resp:  [18] 18  (03/22 0601) BP: (140)/(92) 140/92 mmHg (03/22 0601) SpO2:  [93 %] 93 % (03/22 0601) Weight:  [95.255 kg (210 lb)] 95.255 kg (210 lb) (03/22 0601)  General Appearance: Alert, cooperative, no distress, appears stated age Head: Normocephalic, without obvious abnormality, atraumatic Eyes: PERRL, conjunctiva/corneas clear, EOM's intact, fundi benign, both eyes      Ears: Normal TM's and external ear canals, both ears Throat: Lips, mucosa, and tongue normal; teeth and gums normal Neck: Supple, symmetrical, trachea midline, no adenopathy; thyroid: No enlargement/tenderness/nodules; no carotid bruit or JVD Back: Symmetric, no curvature, ROM normal, no CVA tenderness Lungs: Clear to auscultation bilaterally, respirations unlabored Heart: Regular rate and rhythm, S1 and S2 normal, no murmur, rub or gallop Abdomen: Soft, non-tender, bowel sounds active all four quadrants, no masses, no organomegaly Extremities: Extremities normal, atraumatic, no cyanosis or edema Pulses: 2+ and symmetric all extremities Skin: Skin color, texture, turgor normal, no rashes or lesions  NEUROLOGIC:   Mental status: alert and oriented, no aphasia, good attention span, Fund of knowledge/ memory ok Motor Exam - grossly normal Sensory Exam -  grossly normal Reflexes:  Coordination - grossly normal Gait - grossly normal Balance - grossly normal Cranial Nerves: I: smell Not tested  II: visual acuity  OS: Normal    OD: Normal   II: visual fields Full to confrontation  II: pupils Equal, round, reactive to light  III,VII: ptosis None  III,IV,VI: extraocular muscles  Full ROM  V: mastication Normal  V: facial light touch sensation  Normal  V,VII: corneal reflex  Present  VII: facial muscle function - upper  Normal  VII: facial muscle  function - lower Normal  VIII: hearing Not tested  IX: soft palate elevation  Normal  IX,X: gag reflex Present  XI: trapezius strength  5/5  XI: sternocleidomastoid strength 5/5  XI: neck flexion strength  5/5  XII: tongue strength  Normal    Data Review Lab Results  Component Value Date   WBC 8.9 04/08/2010   HGB 14.6 10/07/2010   HCT 43.0 10/07/2010   MCV 80.5 04/08/2010   PLT 312 04/08/2010   Lab Results  Component Value Date   NA 140 10/07/2010   K 3.5 10/07/2010   CL 104 10/07/2010   CO2 22 04/08/2010   BUN 15 03/18/2011   CREATININE 1.08 03/18/2011   GLUCOSE 102* 10/07/2010   No results found for this basename: INR, PROTIME    Assessment/Plan: Lumbago, lumbar radiculopathy: I discussed situation with patient. I discussed the procedure of a lumbar mild CT. We discussed the risks, benefits and alternatives. His decided proceed with that procedure.   Festus Pursel D 05/16/2011 7:10 AM

## 2011-05-19 ENCOUNTER — Telehealth (HOSPITAL_COMMUNITY): Payer: Self-pay

## 2011-09-24 ENCOUNTER — Encounter: Payer: Self-pay | Admitting: Internal Medicine

## 2011-10-14 ENCOUNTER — Ambulatory Visit (AMBULATORY_SURGERY_CENTER): Payer: Medicare Other | Admitting: *Deleted

## 2011-10-14 VITALS — Ht 69.0 in | Wt 222.0 lb

## 2011-10-14 DIAGNOSIS — Z1211 Encounter for screening for malignant neoplasm of colon: Secondary | ICD-10-CM

## 2011-10-14 MED ORDER — SUPREP BOWEL PREP KIT 17.5-3.13-1.6 GM/177ML PO SOLN: ORAL | Status: DC

## 2011-10-20 ENCOUNTER — Telehealth: Payer: Self-pay | Admitting: Internal Medicine

## 2011-10-20 NOTE — Telephone Encounter (Signed)
Spoke with patient's pharmacy, they did not deliver suprep because insurance was not going to pay and it cost $171.00. Explained this to patient and he was unable to afford this. Explained to patient we will give him sample, patient to come by office today to get the suprep.

## 2011-10-22 ENCOUNTER — Encounter: Payer: Self-pay | Admitting: Internal Medicine

## 2011-10-22 ENCOUNTER — Ambulatory Visit (AMBULATORY_SURGERY_CENTER): Payer: Medicare Other | Admitting: Internal Medicine

## 2011-10-22 VITALS — BP 165/92 | HR 77 | Temp 97.7°F | Resp 14 | Ht 69.0 in | Wt 222.0 lb

## 2011-10-22 DIAGNOSIS — Z1211 Encounter for screening for malignant neoplasm of colon: Secondary | ICD-10-CM

## 2011-10-22 DIAGNOSIS — D126 Benign neoplasm of colon, unspecified: Secondary | ICD-10-CM

## 2011-10-22 DIAGNOSIS — K635 Polyp of colon: Secondary | ICD-10-CM

## 2011-10-22 MED ORDER — SODIUM CHLORIDE 0.9 % IV SOLN: 500.0000 mL | INTRAVENOUS | Status: DC

## 2011-10-22 NOTE — Op Note (Signed)
[facility] [address] [place], [address]   COLONOSCOPY PROCEDURE REPORT  PATIENT: Thomas, Mcclure  MR#: [id number] BIRTHDATE: 1955/10/28 , 56  yrs. old GENDER: Male ENDOSCOPIST: Beverley Fiedler, MD REFERRED XB:[id number], Sami PROCEDURE DATE:  10/22/2011 PROCEDURE:   Colonoscopy with snare polypectomy ASA CLASS:   Class II INDICATIONS:average risk screening and first colonoscopy. MEDICATIONS: MAC sedation, administered by CRNA and propofol (Diprivan) 450mg  IV  DESCRIPTION OF PROCEDURE:   After the risks benefits and alternatives of the procedure were thoroughly explained, informed consent was obtained.  A digital rectal exam revealed no abnormalities of the rectum.   The LB [id number]  endoscope was introduced through the anus and advanced to the cecum, which was identified by both the appendix and ileocecal valve. No adverse events experienced.   The quality of the prep was Suprep good  The instrument was then slowly withdrawn as the colon was fully examined.    COLON FINDINGS: A sessile polyp measuring 3 mm in size was found in the descending colon.  A polypectomy was performed with a cold snare.  The resection was complete and the polyp tissue was completely retrieved.   Otherwise unremarkable examination of the colon.  Retroflexed views revealed internal hemorrhoids. The scope was withdrawn and the procedure completed. COMPLICATIONS: There were no complications.  ENDOSCOPIC IMPRESSION: 1.   Sessile polyp measuring 3 mm in size was found in the descending colon; polypectomy was performed with a cold snare 2.   Small internal hemorrhoids 3.   Otherwise normal colon.  RECOMMENDATIONS: 1.  await pathology results 2.  If the polyp removed today is proven to be adenomatous (pre-cancerous) polyp, you will need a repeat colonoscopy in 5 years.  Otherwise you should continue to follow colorectal cancer screening guidelines for "routine risk" patients  with colonoscopy in 10 years.  You will receive a letter within 1-2 weeks with the results of your biopsy as well as final recommendations.  Please call my office if you have not received a letter after 3 weeks.  eSigned:  Beverley Fiedler, MD 10/22/2011 2:53 PM   cc: The Patient   PATIENT NAME:  Thomas, Mcclure MR#: [id number]

## 2011-10-22 NOTE — Patient Instructions (Signed)
Colon polyp removed,hemorrhoids seen today. See handouts given. Call us with any questions or concerns. Thank you!!  YOU HAD AN ENDOSCOPIC PROCEDURE TODAY AT THE [facility]: Refer to the procedure report that was given to you for any specific questions about what was found during the examination.  If the procedure report does not answer your questions, please call your gastroenterologist to clarify.  If you requested that your care partner not be given the details of your procedure findings, then the procedure report has been included in a sealed envelope for you to review at your convenience later.  YOU SHOULD EXPECT: Some feelings of bloating in the abdomen. Passage of more gas than usual.  Walking can help get rid of the air that was put into your GI tract during the procedure and reduce the bloating. If you had a lower endoscopy (such as a colonoscopy or flexible sigmoidoscopy) you may notice spotting of blood in your stool or on the toilet paper. If you underwent a bowel prep for your procedure, then you may not have a normal bowel movement for a few days.  DIET: Your first meal following the procedure should be a light meal and then it is ok to progress to your normal diet.  A half-sandwich or bowl of soup is an example of a good first meal.  Heavy or fried foods are harder to digest and may make you feel nauseous or bloated.  Likewise meals heavy in dairy and vegetables can cause extra gas to form and this can also increase the bloating.  Drink plenty of fluids but you should avoid alcoholic beverages for 24 hours.  ACTIVITY: Your care partner should take you home directly after the procedure.  You should plan to take it easy, moving slowly for the rest of the day.  You can resume normal activity the day after the procedure however you should NOT DRIVE or use heavy machinery for 24 hours (because of the sedation medicines used during the test).    SYMPTOMS TO REPORT IMMEDIATELY: A  gastroenterologist can be reached at any hour.  During normal business hours, 8:30 AM to 5:00 PM Monday through Friday, call [phone].  After hours and on weekends, please call the GI answering service at [phone] who will take a message and have the physician on call contact you.   Following lower endoscopy (colonoscopy or flexible sigmoidoscopy):  Excessive amounts of blood in the stool  Significant tenderness or worsening of abdominal pains  Swelling of the abdomen that is new, acute  Fever of 100F or higher  FOLLOW UP: If any biopsies were taken you will be contacted by phone or by letter within the next 1-3 weeks.  Call your gastroenterologist if you have not heard about the biopsies in 3 weeks.  Our staff will call the home number listed on your records the next business day following your procedure to check on you and address any questions or concerns that you may have at that time regarding the information given to you following your procedure. This is a courtesy call and so if there is no answer at the home number and we have not heard from you through the emergency physician on call, we will assume that you have returned to your regular daily activities without incident.  SIGNATURES/CONFIDENTIALITY: You and/or your care partner have signed paperwork which will be entered into your electronic medical record.  These signatures attest to the fact that that the information above on  your After Visit Summary has been reviewed and is understood.  Full responsibility of the confidentiality of this discharge information lies with you and/or your care-partner.

## 2011-10-22 NOTE — Progress Notes (Signed)
Patient did not experience any of the following events: a burn prior to discharge; a fall within the facility; wrong site/side/patient/procedure/implant event; or a hospital transfer or hospital admission upon discharge from the facility. (G8907) Patient did not have preoperative order for IV antibiotic SSI prophylaxis. (G8918)  

## 2011-10-23 ENCOUNTER — Telehealth: Payer: Self-pay | Admitting: *Deleted

## 2011-10-23 NOTE — Telephone Encounter (Signed)
  Follow up Call-  Call back number 10/22/2011  Post procedure Call Back phone  # [phone]  Permission to leave phone message Yes     Patient questions:  Do you have a fever, pain , or abdominal swelling? no Pain Score  0 *  Have you tolerated food without any problems? yes  Have you been able to return to your normal activities? yes  Do you have any questions about your discharge instructions: Diet   no Medications  no Follow up visit  no  Do you have questions or concerns about your Care? no  Actions: * If pain score is 4 or above: No action needed, pain <4.

## 2011-11-10 ENCOUNTER — Encounter: Payer: Self-pay | Admitting: Internal Medicine

## 2011-12-01 ENCOUNTER — Emergency Department (HOSPITAL_COMMUNITY): Payer: Medicare Other

## 2011-12-01 ENCOUNTER — Emergency Department (HOSPITAL_COMMUNITY)
Admission: EM | Admit: 2011-12-01 | Discharge: 2011-12-01 | Disposition: A | Discharge status: Home / Self Care | Payer: Medicare Other | Attending: Emergency Medicine | Admitting: Emergency Medicine

## 2011-12-01 ENCOUNTER — Encounter (HOSPITAL_COMMUNITY): Payer: Self-pay | Admitting: Emergency Medicine

## 2011-12-01 DIAGNOSIS — I10 Essential (primary) hypertension: Secondary | ICD-10-CM | POA: Insufficient documentation

## 2011-12-01 DIAGNOSIS — E785 Hyperlipidemia, unspecified: Secondary | ICD-10-CM | POA: Insufficient documentation

## 2011-12-01 DIAGNOSIS — Z79899 Other long term (current) drug therapy: Secondary | ICD-10-CM | POA: Insufficient documentation

## 2011-12-01 DIAGNOSIS — M549 Dorsalgia, unspecified: Secondary | ICD-10-CM

## 2011-12-01 DIAGNOSIS — M545 Low back pain, unspecified: Secondary | ICD-10-CM | POA: Insufficient documentation

## 2011-12-01 DIAGNOSIS — R21 Rash and other nonspecific skin eruption: Secondary | ICD-10-CM | POA: Insufficient documentation

## 2011-12-01 DIAGNOSIS — M069 Rheumatoid arthritis, unspecified: Secondary | ICD-10-CM | POA: Insufficient documentation

## 2011-12-01 DIAGNOSIS — Z7982 Long term (current) use of aspirin: Secondary | ICD-10-CM | POA: Insufficient documentation

## 2011-12-01 DIAGNOSIS — W19XXXA Unspecified fall, initial encounter: Secondary | ICD-10-CM | POA: Insufficient documentation

## 2011-12-01 DIAGNOSIS — R29898 Other symptoms and signs involving the musculoskeletal system: Secondary | ICD-10-CM | POA: Insufficient documentation

## 2011-12-01 DIAGNOSIS — K219 Gastro-esophageal reflux disease without esophagitis: Secondary | ICD-10-CM | POA: Insufficient documentation

## 2011-12-01 DIAGNOSIS — R209 Unspecified disturbances of skin sensation: Secondary | ICD-10-CM | POA: Insufficient documentation

## 2011-12-01 DIAGNOSIS — E119 Type 2 diabetes mellitus without complications: Secondary | ICD-10-CM | POA: Insufficient documentation

## 2011-12-01 LAB — CBC WITH DIFFERENTIAL/PLATELET
Basophils Absolute: 0 10*3/uL (ref 0.0–0.1)
Basophils Relative: 0 % (ref 0–1)
Eosinophils Absolute: 0.4 10*3/uL (ref 0.0–0.7)
Eosinophils Relative: 5 % (ref 0–5)
HCT: 42.8 % (ref 39.0–52.0)
Hemoglobin: 14.6 g/dL (ref 13.0–17.0)
Lymphocytes Relative: 31 % (ref 12–46)
Lymphs Abs: 2.5 10*3/uL (ref 0.7–4.0)
MCH: 27.5 pg (ref 26.0–34.0)
MCHC: 34.1 g/dL (ref 30.0–36.0)
MCV: 80.8 fL (ref 78.0–100.0)
Monocytes Absolute: 0.8 10*3/uL (ref 0.1–1.0)
Monocytes Relative: 9 % (ref 3–12)
Neutro Abs: 4.4 10*3/uL (ref 1.7–7.7)
Neutrophils Relative %: 54 % (ref 43–77)
Platelets: 269 10*3/uL (ref 150–400)
RBC: 5.3 MIL/uL (ref 4.22–5.81)
RDW: 15.9 % — ABNORMAL HIGH (ref 11.5–15.5)
WBC: 8.1 10*3/uL (ref 4.0–10.5)

## 2011-12-01 LAB — BASIC METABOLIC PANEL
BUN: 19 mg/dL (ref 6–23)
CO2: 27 mEq/L (ref 19–32)
Calcium: 10.1 mg/dL (ref 8.4–10.5)
Chloride: 102 mEq/L (ref 96–112)
Creatinine, Ser: 1.18 mg/dL (ref 0.50–1.35)
GFR calc Af Amer: 78 mL/min — ABNORMAL LOW (ref >90)
GFR calc non Af Amer: 67 mL/min — ABNORMAL LOW (ref >90)
Glucose, Bld: 113 mg/dL — ABNORMAL HIGH (ref 70–99)
Potassium: 4.2 mEq/L (ref 3.5–5.1)
Sodium: 139 mEq/L (ref 135–145)

## 2011-12-01 LAB — MAGNESIUM: Magnesium: 2.3 mg/dL (ref 1.5–2.5)

## 2011-12-01 MED ORDER — CLOTRIMAZOLE 1 % EX CREA: TOPICAL_CREAM | CUTANEOUS | Status: DC

## 2011-12-01 NOTE — ED Notes (Signed)
Pt became very agitated in delay of results, we explained that MD would be in room soona nd that RN was tied up with very critical pt, and pt walked out refusing to wait on discharge papers.

## 2011-12-01 NOTE — ED Notes (Addendum)
Pt here with multiple complaints. Muscle spasms, back pain, lightheaded, falls. Pt has hx of multiple back surgeries by Dr.Jenkins. Pt states he came to ED "to get into the office faster and not have to wait". Pt has been having these symptoms for "years". Pt has not contacted Dr.Jenkins office.

## 2011-12-01 NOTE — ED Notes (Signed)
Pt urinated in urinal and poured urine down sink before it was measured by staff.  Bladder scan showed residual of 152 mL.

## 2011-12-01 NOTE — ED Notes (Signed)
Pt states he takes multiple pain medications and "nothing helps". Pt reports he is to have 3rd back surgery in the future. Pt also c/o "my toes lock up"

## 2011-12-01 NOTE — ED Provider Notes (Signed)
History     CSN: [id number]  Arrival date & time 12/01/11  0908   First MD Initiated Contact with Patient 12/01/11 [phone]      Chief Complaint  Patient presents with  . Fall    (Consider location/radiation/quality/duration/timing/severity/associated sxs/prior treatment) HPI Comments: Pt comes in with cc of back pain. Pt has hx of lumbar surgery, and states that over the past 2-3 weeks, he has been having increased fall as his legs are giving out. Pt has no NEW numbness in his lower extremities, and he denies associated constant weakness, urinary incontinence, urinary retention, bowel incontinence, saddle anesthesia. Pt is not on coumadin, plavix, and has no headaches right now, no n/v/visual complains. Pt also complains of rash to the left foot x few weeks, improved now as he is using some topical agent. The lesion is itchy, not painful.    Patient is a 56 y.o. male presenting with fall. The history is provided by the patient.  Fall Associated symptoms include numbness. Pertinent negatives include no abdominal pain.    Past Medical History  Diagnosis Date  . Arthritis     Rheumatoid  . Diabetes mellitus     diet controlled  . GERD (gastroesophageal reflux disease)   . Hyperlipidemia   . Hypertension     Past Surgical History  Procedure Date  . Lumbar spine surgery 03/2009  & 2012    Family History  Problem Relation Age of Onset  . Heart disease Mother   . Prostate cancer Brother     History  Substance Use Topics  . Smoking status: Former Games developer  . Smokeless tobacco: Never Used  . Alcohol Use: 0.6 oz/week    1 Cans of beer per week      Review of Systems  Constitutional: Negative for activity change and appetite change.  Respiratory: Negative for cough and shortness of breath.   Cardiovascular: Negative for chest pain.  Gastrointestinal: Negative for abdominal pain.  Genitourinary: Negative for dysuria.  Neurological: Positive for weakness and numbness.     Allergies  Adhesive  Home Medications   Current Outpatient Rx  Name Route Sig Dispense Refill  . ASPIRIN EC 81 MG PO TBEC Oral Take 81 mg by mouth daily.    . CYCLOBENZAPRINE HCL 10 MG PO TABS Oral Take 10 mg by mouth 3 (three) times daily as needed. For muscle spasm    . DIAZEPAM 5 MG PO TABS Oral Take 5 mg by mouth 2 (two) times daily.    . DULOXETINE HCL 60 MG PO CPEP Oral Take 60 mg by mouth daily.    Marland Kitchen HYDROCHLOROTHIAZIDE 25 MG PO TABS Oral Take 25 mg by mouth daily.    Marland Kitchen LISINOPRIL 20 MG PO TABS Oral Take 20 mg by mouth daily.    Marland Kitchen NAPROXEN 500 MG PO TABS Oral Take 500 mg by mouth 2 (two) times daily with a meal.    . OMEPRAZOLE 40 MG PO CPDR Oral Take 40 mg by mouth daily.    Marland Kitchen PRAVASTATIN SODIUM 40 MG PO TABS Oral Take 1 tablet by mouth Daily.    Marland Kitchen PREGABALIN 100 MG PO CAPS Oral Take 100 mg by mouth 3 (three) times daily.    . TOPIRAMATE 50 MG PO TABS Oral Take 50 mg by mouth 2 (two) times daily as needed. For nerve pain    . TRAMADOL HCL 50 MG PO TABS Oral Take 2 tablets by mouth Twice daily as needed. pain  BP 145/99  Pulse 94  Temp 98.2 F (36.8 C) (Oral)  Resp 20  SpO2 98%  Physical Exam  Nursing note and vitals reviewed. Constitutional: He is oriented to person, place, and time. He appears well-developed.  HENT:  Head: Normocephalic and atraumatic.  Eyes: Conjunctivae normal and EOM are normal. Pupils are equal, round, and reactive to light.  Neck: Normal range of motion. Neck supple.  Cardiovascular: Normal rate and regular rhythm.   Pulmonary/Chest: Effort normal and breath sounds normal.  Abdominal: Soft. Bowel sounds are normal. He exhibits no distension. There is no tenderness. There is no rebound and no guarding.  Musculoskeletal: He exhibits no edema.  Neurological: He is alert and oriented to person, place, and time. No cranial nerve deficit. Coordination normal.       Pt has tenderness over the lumbar region No step offs, no erythema. Pt has  1+ patellar reflex bilaterally. Able to ambulate, intact rectal tone.  Pt is unable to discriminate between sharp and dull in bilateral lower extremities  Skin: Skin is warm.    ED Course  Procedures (including critical care time)  Labs Reviewed - No data to display No results found.   No diagnosis found.    MDM  DDx includes: - DJD of the back - Spondylitises/ spondylosis - Sciatica - Spinal cord compression - Conus medullaris - Epidural hematoma - Epidural abscess - Lytic/pathologic fracture - Myelitis - Musculoskeletal pain  Pt comes in with cc of back pain, falls, rash. He has baseline neuro deficits due to prior disease, and so the neuro exam is equivocal - but he has 4+ strength, with equal patellar reflexand intact rectal tone with no saddle anesthesia, no urinary retention or incontinence - all of which are reassuring.  We will get CT head - due to frequent falls and lytes to make sure his cramping is not from electrolyte abd. Post void residual ordered as well, and is 150 ml per bladder scan - which is reassuring as it is not at a significantly high level (like 400 ml).  Will request Neurosurgeon f.u, as heh as hx of surgery and i think PCP would likely do the same.        Derwood Kaplan, MD 12/01/11 1141

## 2012-01-30 ENCOUNTER — Other Ambulatory Visit: Payer: Self-pay | Admitting: Neurosurgery

## 2012-01-30 DIAGNOSIS — M549 Dorsalgia, unspecified: Secondary | ICD-10-CM

## 2012-03-01 ENCOUNTER — Other Ambulatory Visit: Payer: Medicare Other

## 2012-03-09 ENCOUNTER — Ambulatory Visit
Admission: RE | Admit: 2012-03-09 | Discharge: 2012-03-09 | Disposition: A | Discharge status: Home / Self Care | Payer: Medicare Other | Source: Ambulatory Visit | Attending: Neurosurgery | Admitting: Neurosurgery

## 2012-03-09 DIAGNOSIS — M549 Dorsalgia, unspecified: Secondary | ICD-10-CM

## 2012-03-31 ENCOUNTER — Other Ambulatory Visit: Payer: Self-pay | Admitting: Neurosurgery

## 2012-03-31 DIAGNOSIS — M542 Cervicalgia: Secondary | ICD-10-CM

## 2012-04-06 ENCOUNTER — Ambulatory Visit
Admission: RE | Admit: 2012-04-06 | Discharge: 2012-04-06 | Disposition: A | Discharge status: Home / Self Care | Payer: Medicare Other | Source: Ambulatory Visit | Attending: Neurosurgery | Admitting: Neurosurgery

## 2012-04-06 DIAGNOSIS — M542 Cervicalgia: Secondary | ICD-10-CM

## 2012-04-12 ENCOUNTER — Other Ambulatory Visit: Payer: Self-pay | Admitting: Neurosurgery

## 2012-04-13 ENCOUNTER — Encounter (HOSPITAL_COMMUNITY): Payer: Self-pay | Admitting: Pharmacy Technician

## 2012-04-16 ENCOUNTER — Encounter (HOSPITAL_COMMUNITY)
Admission: RE | Admit: 2012-04-16 | Discharge: 2012-04-16 | Disposition: A | Discharge status: Home / Self Care | Payer: Medicare Other | Source: Ambulatory Visit | Attending: Anesthesiology | Admitting: Anesthesiology

## 2012-04-16 ENCOUNTER — Encounter (HOSPITAL_COMMUNITY): Payer: Self-pay

## 2012-04-16 ENCOUNTER — Encounter (HOSPITAL_COMMUNITY)
Admission: RE | Admit: 2012-04-16 | Discharge: 2012-04-16 | Disposition: A | Discharge status: Home / Self Care | Payer: Medicare Other | Source: Ambulatory Visit | Attending: Neurosurgery | Admitting: Neurosurgery

## 2012-04-16 HISTORY — DX: Other chronic pain: G89.29

## 2012-04-16 HISTORY — DX: Myoneural disorder, unspecified: G70.9

## 2012-04-16 HISTORY — DX: Dorsalgia, unspecified: M54.9

## 2012-04-16 LAB — CBC
HCT: 40.9 % (ref 39.0–52.0)
Hemoglobin: 14.1 g/dL (ref 13.0–17.0)
MCH: 27.4 pg (ref 26.0–34.0)
MCHC: 34.5 g/dL (ref 30.0–36.0)
MCV: 79.6 fL (ref 78.0–100.0)
Platelets: 257 10*3/uL (ref 150–400)
RBC: 5.14 MIL/uL (ref 4.22–5.81)
RDW: 16.2 % — ABNORMAL HIGH (ref 11.5–15.5)
WBC: 10.6 10*3/uL — ABNORMAL HIGH (ref 4.0–10.5)

## 2012-04-16 LAB — BASIC METABOLIC PANEL
BUN: 19 mg/dL (ref 6–23)
CO2: 28 mEq/L (ref 19–32)
Calcium: 9.5 mg/dL (ref 8.4–10.5)
Chloride: 101 mEq/L (ref 96–112)
Creatinine, Ser: 1.06 mg/dL (ref 0.50–1.35)
GFR calc Af Amer: 89 mL/min — ABNORMAL LOW (ref >90)
GFR calc non Af Amer: 77 mL/min — ABNORMAL LOW (ref >90)
Glucose, Bld: 103 mg/dL — ABNORMAL HIGH (ref 70–99)
Potassium: 3.6 mEq/L (ref 3.5–5.1)
Sodium: 142 mEq/L (ref 135–145)

## 2012-04-16 LAB — SURGICAL PCR SCREEN
MRSA, PCR: NEGATIVE
Staphylococcus aureus: NEGATIVE

## 2012-04-16 NOTE — Pre-Procedure Instructions (Signed)
Thomas Mcclure  04/16/2012   Your procedure is scheduled on:  Monday April 19, 2012  Report to [facility] at 8:45 AM.  Call this number if you have problems the morning of surgery: [phone]   Remember:   Do not eat food or drink liquids after midnight.   Take these medicines the morning of surgery with A SIP OF WATER: diazepam, cymbalta, omeprazole, lyrica, tramadol, topiramate   Do not wear jewelry, make-up or nail polish.  Do not wear lotions, powders, or perfumes.    Men may shave face and neck.  Do not bring valuables to the hospital.  Contacts, dentures or bridgework may not be worn into surgery.  Leave suitcase in the car. After surgery it may be brought to your room.  For patients admitted to the hospital, checkout time is 11:00 AM the day of  discharge.   Patients discharged the day of surgery will not be allowed to drive  home.  Name and phone number of your driver: family / friend  Special Instructions: Shower using CHG 2 nights before surgery and the night before surgery.  If you shower the day of surgery use CHG.  Use special wash - you have one bottle of CHG for all showers.  You should use approximately 1/3 of the bottle for each shower.   Please read over the following fact sheets that you were given: Pain Booklet, Coughing and Deep Breathing, MRSA Information and Surgical Site Infection Prevention

## 2012-04-18 MED ORDER — CEFAZOLIN SODIUM-DEXTROSE 2-3 GM-% IV SOLR
2.0000 g | INTRAVENOUS | Status: DC
  Filled 2012-04-18: qty 50

## 2012-04-19 ENCOUNTER — Encounter (HOSPITAL_COMMUNITY): Payer: Self-pay | Admitting: Certified Registered Nurse Anesthetist

## 2012-04-19 ENCOUNTER — Encounter (HOSPITAL_COMMUNITY)
Admission: RE | Disposition: A | Discharge status: Home / Self Care | Payer: Self-pay | Source: Ambulatory Visit | Attending: Neurosurgery

## 2012-04-19 ENCOUNTER — Ambulatory Visit (HOSPITAL_COMMUNITY)
Admission: RE | Admit: 2012-04-19 | Discharge: 2012-04-19 | Disposition: A | Discharge status: Home / Self Care | Payer: Medicare Other | Source: Ambulatory Visit | Attending: Neurosurgery | Admitting: Neurosurgery

## 2012-04-19 ENCOUNTER — Ambulatory Visit (HOSPITAL_COMMUNITY): Payer: Medicare Other | Admitting: Certified Registered Nurse Anesthetist

## 2012-04-19 DIAGNOSIS — M069 Rheumatoid arthritis, unspecified: Secondary | ICD-10-CM | POA: Insufficient documentation

## 2012-04-19 DIAGNOSIS — M549 Dorsalgia, unspecified: Secondary | ICD-10-CM | POA: Insufficient documentation

## 2012-04-19 DIAGNOSIS — Z7982 Long term (current) use of aspirin: Secondary | ICD-10-CM | POA: Insufficient documentation

## 2012-04-19 DIAGNOSIS — D1739 Benign lipomatous neoplasm of skin and subcutaneous tissue of other sites: Secondary | ICD-10-CM | POA: Insufficient documentation

## 2012-04-19 DIAGNOSIS — D170 Benign lipomatous neoplasm of skin and subcutaneous tissue of head, face and neck: Secondary | ICD-10-CM

## 2012-04-19 DIAGNOSIS — K219 Gastro-esophageal reflux disease without esophagitis: Secondary | ICD-10-CM | POA: Insufficient documentation

## 2012-04-19 DIAGNOSIS — E119 Type 2 diabetes mellitus without complications: Secondary | ICD-10-CM | POA: Insufficient documentation

## 2012-04-19 DIAGNOSIS — G8929 Other chronic pain: Secondary | ICD-10-CM | POA: Insufficient documentation

## 2012-04-19 DIAGNOSIS — I10 Essential (primary) hypertension: Secondary | ICD-10-CM | POA: Insufficient documentation

## 2012-04-19 DIAGNOSIS — Z01812 Encounter for preprocedural laboratory examination: Secondary | ICD-10-CM | POA: Insufficient documentation

## 2012-04-19 DIAGNOSIS — Z79899 Other long term (current) drug therapy: Secondary | ICD-10-CM | POA: Insufficient documentation

## 2012-04-19 DIAGNOSIS — E785 Hyperlipidemia, unspecified: Secondary | ICD-10-CM | POA: Insufficient documentation

## 2012-04-19 HISTORY — PX: MASS EXCISION: SHX2000

## 2012-04-19 SURGERY — EXCISION MASS
Anesthesia: General | Site: Neck | Wound class: Clean

## 2012-04-19 MED ORDER — MIDAZOLAM HCL 5 MG/5ML IJ SOLN
INTRAMUSCULAR | Status: DC | PRN
  Administered 2012-04-19: 2 mg via INTRAVENOUS

## 2012-04-19 MED ORDER — ONDANSETRON HCL 4 MG/2ML IJ SOLN: 4.0000 mg | Freq: Four times a day (QID) | INTRAMUSCULAR | Status: DC | PRN

## 2012-04-19 MED ORDER — OXYCODONE HCL 5 MG PO TABS: 5.0000 mg | ORAL_TABLET | ORAL | Status: DC | PRN

## 2012-04-19 MED ORDER — GLYCOPYRROLATE 0.2 MG/ML IJ SOLN
INTRAMUSCULAR | Status: DC | PRN
  Administered 2012-04-19: .8 mg via INTRAVENOUS

## 2012-04-19 MED ORDER — LIDOCAINE HCL (CARDIAC) 20 MG/ML IV SOLN
INTRAVENOUS | Status: DC | PRN
  Administered 2012-04-19: 50 mg via INTRAVENOUS

## 2012-04-19 MED ORDER — PHENYLEPHRINE HCL 10 MG/ML IJ SOLN
INTRAMUSCULAR | Status: DC | PRN
  Administered 2012-04-19 (×2): 120 ug via INTRAVENOUS

## 2012-04-19 MED ORDER — HEMOSTATIC AGENTS (NO CHARGE) OPTIME
TOPICAL | Status: DC | PRN
  Administered 2012-04-19: 1 via TOPICAL

## 2012-04-19 MED ORDER — OXYCODONE HCL 5 MG PO TABS: 5.0000 mg | ORAL_TABLET | Freq: Once | ORAL | Status: AC | PRN

## 2012-04-19 MED ORDER — NEOSTIGMINE METHYLSULFATE 1 MG/ML IJ SOLN
INTRAMUSCULAR | Status: DC | PRN
  Administered 2012-04-19: 4 mg via INTRAVENOUS

## 2012-04-19 MED ORDER — ARTIFICIAL TEARS OP OINT
TOPICAL_OINTMENT | OPHTHALMIC | Status: DC | PRN
  Administered 2012-04-19: 1 via OPHTHALMIC

## 2012-04-19 MED ORDER — FENTANYL CITRATE 0.05 MG/ML IJ SOLN
INTRAMUSCULAR | Status: DC | PRN
  Administered 2012-04-19: 50 ug via INTRAVENOUS
  Administered 2012-04-19: 150 ug via INTRAVENOUS
  Administered 2012-04-19: 50 ug via INTRAVENOUS

## 2012-04-19 MED ORDER — THROMBIN 5000 UNITS EX SOLR
CUTANEOUS | Status: DC | PRN
  Administered 2012-04-19 (×2): 5000 [IU] via TOPICAL

## 2012-04-19 MED ORDER — CEFAZOLIN SODIUM-DEXTROSE 2-3 GM-% IV SOLR
INTRAVENOUS | Status: AC
  Administered 2012-04-19: 3 g via INTRAVENOUS
  Filled 2012-04-19: qty 50

## 2012-04-19 MED ORDER — BACITRACIN ZINC 500 UNIT/GM EX OINT
TOPICAL_OINTMENT | CUTANEOUS | Status: DC | PRN
  Administered 2012-04-19: 1 via TOPICAL

## 2012-04-19 MED ORDER — SODIUM CHLORIDE 0.9 % IR SOLN
Status: DC | PRN
  Administered 2012-04-19: 11:00:00

## 2012-04-19 MED ORDER — BUPIVACAINE-EPINEPHRINE 0.5% -1:200000 IJ SOLN
INTRAMUSCULAR | Status: DC | PRN
  Administered 2012-04-19: 10 mL

## 2012-04-19 MED ORDER — OXYCODONE HCL 5 MG PO TABS
ORAL_TABLET | ORAL | Status: AC
  Administered 2012-04-19: 5 mg via ORAL
  Filled 2012-04-19: qty 1

## 2012-04-19 MED ORDER — OXYCODONE HCL 5 MG/5ML PO SOLN: 5.0000 mg | Freq: Once | ORAL | Status: AC | PRN

## 2012-04-19 MED ORDER — EPHEDRINE SULFATE 50 MG/ML IJ SOLN
INTRAMUSCULAR | Status: DC | PRN
  Administered 2012-04-19 (×2): 15 mg via INTRAVENOUS

## 2012-04-19 MED ORDER — PROPOFOL 10 MG/ML IV BOLUS
INTRAVENOUS | Status: DC | PRN
  Administered 2012-04-19: 200 mg via INTRAVENOUS
  Administered 2012-04-19: 50 mg via INTRAVENOUS

## 2012-04-19 MED ORDER — VECURONIUM BROMIDE 10 MG IV SOLR
INTRAVENOUS | Status: DC | PRN
  Administered 2012-04-19: 1 mg via INTRAVENOUS
  Administered 2012-04-19: 7 mg via INTRAVENOUS

## 2012-04-19 MED ORDER — HYDROMORPHONE HCL PF 1 MG/ML IJ SOLN
INTRAMUSCULAR | Status: AC
  Administered 2012-04-19: 0.5 mg via INTRAVENOUS
  Filled 2012-04-19: qty 1

## 2012-04-19 MED ORDER — ONDANSETRON HCL 4 MG/2ML IJ SOLN
INTRAMUSCULAR | Status: DC | PRN
  Administered 2012-04-19: 4 mg via INTRAVENOUS

## 2012-04-19 MED ORDER — 0.9 % SODIUM CHLORIDE (POUR BTL) OPTIME
TOPICAL | Status: DC | PRN
  Administered 2012-04-19: 1000 mL

## 2012-04-19 MED ORDER — LACTATED RINGERS IV SOLN
INTRAVENOUS | Status: DC | PRN
  Administered 2012-04-19 (×2): via INTRAVENOUS

## 2012-04-19 MED ORDER — HYDROMORPHONE HCL PF 1 MG/ML IJ SOLN
0.2500 mg | INTRAMUSCULAR | Status: DC | PRN
  Administered 2012-04-19 (×2): 0.5 mg via INTRAVENOUS

## 2012-04-19 SURGICAL SUPPLY — 47 items
APL SKNCLS STERI-STRIP NONHPOA (GAUZE/BANDAGES/DRESSINGS) ×1
BAG DECANTER FOR FLEXI CONT (MISCELLANEOUS) ×2 IMPLANT
BENZOIN TINCTURE PRP APPL 2/3 (GAUZE/BANDAGES/DRESSINGS) ×2 IMPLANT
BLADE SURG ROTATE 9660 (MISCELLANEOUS) IMPLANT
CANISTER SUCTION 2500CC (MISCELLANEOUS) ×2 IMPLANT
CLOTH BEACON ORANGE TIMEOUT ST (SAFETY) ×2 IMPLANT
CONT SPEC 4OZ CLIKSEAL STRL BL (MISCELLANEOUS) ×1 IMPLANT
DRAPE LAPAROTOMY 100X72 PEDS (DRAPES) IMPLANT
DRAPE LAPAROTOMY 100X72X124 (DRAPES) IMPLANT
DRAPE POUCH INSTRU U-SHP 10X18 (DRAPES) ×2 IMPLANT
DRAPE SURG 17X23 STRL (DRAPES) ×8 IMPLANT
ELECT CAUTERY BLADE 6.4 (BLADE) ×1 IMPLANT
ELECT REM PT RETURN 9FT ADLT (ELECTROSURGICAL) ×2
ELECTRODE REM PT RTRN 9FT ADLT (ELECTROSURGICAL) ×1 IMPLANT
GAUZE SPONGE 4X4 16PLY XRAY LF (GAUZE/BANDAGES/DRESSINGS) IMPLANT
GLOVE BIO SURGEON STRL SZ8.5 (GLOVE) ×2 IMPLANT
GLOVE BIOGEL PI IND STRL 8 (GLOVE) IMPLANT
GLOVE BIOGEL PI INDICATOR 8 (GLOVE) ×2
GLOVE ECLIPSE 7.5 STRL STRAW (GLOVE) ×1 IMPLANT
GLOVE EXAM NITRILE LRG STRL (GLOVE) IMPLANT
GLOVE EXAM NITRILE MD LF STRL (GLOVE) IMPLANT
GLOVE EXAM NITRILE XL STR (GLOVE) IMPLANT
GLOVE EXAM NITRILE XS STR PU (GLOVE) IMPLANT
GLOVE INDICATOR 8.0 STRL GRN (GLOVE) ×2 IMPLANT
GLOVE OPTIFIT SS 8.0 STRL (GLOVE) ×1 IMPLANT
GLOVE SS BIOGEL STRL SZ 8 (GLOVE) ×1 IMPLANT
GLOVE SUPERSENSE BIOGEL SZ 8 (GLOVE) ×1
GOWN BRE IMP SLV AUR LG STRL (GOWN DISPOSABLE) IMPLANT
GOWN BRE IMP SLV AUR XL STRL (GOWN DISPOSABLE) ×3 IMPLANT
GOWN STRL REIN 2XL LVL4 (GOWN DISPOSABLE) ×1 IMPLANT
KIT BASIN OR (CUSTOM PROCEDURE TRAY) ×2 IMPLANT
KIT ROOM TURNOVER OR (KITS) ×2 IMPLANT
NEEDLE HYPO 22GX1.5 SAFETY (NEEDLE) IMPLANT
NS IRRIG 1000ML POUR BTL (IV SOLUTION) ×2 IMPLANT
PACK LAMINECTOMY NEURO (CUSTOM PROCEDURE TRAY) ×2 IMPLANT
PAD ARMBOARD 7.5X6 YLW CONV (MISCELLANEOUS) ×6 IMPLANT
SPONGE GAUZE 4X4 12PLY (GAUZE/BANDAGES/DRESSINGS) ×2 IMPLANT
SPONGE SURGIFOAM ABS GEL SZ50 (HEMOSTASIS) ×1 IMPLANT
STRIP CLOSURE SKIN 1/2X4 (GAUZE/BANDAGES/DRESSINGS) ×2 IMPLANT
SUT VIC AB 1 CT1 18XBRD ANBCTR (SUTURE) ×1 IMPLANT
SUT VIC AB 1 CT1 8-18 (SUTURE) ×2
SUT VIC AB 2-0 CP2 18 (SUTURE) ×2 IMPLANT
SYR 20ML ECCENTRIC (SYRINGE) ×2 IMPLANT
TAPE CLOTH SURG 4X10 WHT LF (GAUZE/BANDAGES/DRESSINGS) ×1 IMPLANT
TOWEL OR 17X24 6PK STRL BLUE (TOWEL DISPOSABLE) ×2 IMPLANT
TOWEL OR 17X26 10 PK STRL BLUE (TOWEL DISPOSABLE) ×2 IMPLANT
WATER STERILE IRR 1000ML POUR (IV SOLUTION) ×2 IMPLANT

## 2012-04-19 NOTE — Transfer of Care (Signed)
Immediate Anesthesia Transfer of Care Note  Patient: Thomas Mcclure  Procedure(s) Performed: Procedure(s) with comments: removal of posterior cervical lipoma (N/A) - Removal of posterior cervical lipoma  Patient Location: PACU  Anesthesia Type:General  Level of Consciousness: awake, alert  and oriented  Airway & Oxygen Therapy: Patient Spontanous Breathing and Patient connected to nasal cannula oxygen  Post-op Assessment: Report given to PACU RN, Post -op Vital signs reviewed and stable and Patient moving all extremities X 4  Post vital signs: Reviewed and stable  Complications: No apparent anesthesia complications

## 2012-04-19 NOTE — H&P (Signed)
Subjective: The patient is a 57 year old black male who I performed several lumbar surgeries on. The patient has noted a posterior cervical mass which has enlarged and become uncomfortable. He was worked up with a cervical MRI which demonstrated this to be a lipoma. I discussed the various treatment option with the patient including surgery. The patient has weighed the risks, benefits, and alternatives surgery and decided proceed with an excision of this lipoma.   Past Medical History  Diagnosis Date  . Arthritis     Rheumatoid  . Diabetes mellitus     diet controlled  . GERD (gastroesophageal reflux disease)   . Hyperlipidemia   . Hypertension     Dr. Quitman Livings [phone]  . Chronic back pain   . Neuromuscular disorder     "with nerve damage"    Past Surgical History  Procedure Laterality Date  . Lumbar spine surgery  03/2009  & 2012  . Wisdom tooth extraction      hx of    Allergies  Allergen Reactions  . Adhesive (Tape) Rash and Other (See Comments)    "blisters"    History  Substance Use Topics  . Smoking status: Former Games developer  . Smokeless tobacco: Never Used  . Alcohol Use: 0.6 oz/week    1 Cans of beer per week    Family History  Problem Relation Age of Onset  . Heart disease Mother   . Prostate cancer Brother    Prior to Admission medications   Medication Sig Start Date End Date Taking? Authorizing Provider  aspirin 325 MG tablet Take 325 mg by mouth daily.   Yes Historical Provider, MD  cyclobenzaprine (FLEXERIL) 10 MG tablet Take 10 mg by mouth 3 (three) times daily as needed. For muscle spasm   Yes Historical Provider, MD  diazepam (VALIUM) 5 MG tablet Take 5 mg by mouth 2 (two) times daily.   Yes Historical Provider, MD  DULoxetine (CYMBALTA) 60 MG capsule Take 60 mg by mouth daily.   Yes Historical Provider, MD  hydrochlorothiazide (HYDRODIURIL) 25 MG tablet Take 25 mg by mouth daily.   Yes Historical Provider, MD  lisinopril (PRINIVIL,ZESTRIL) 20 MG  tablet Take 20 mg by mouth daily.   Yes Historical Provider, MD  meloxicam (MOBIC) 15 MG tablet Take 15 mg by mouth daily.   Yes Historical Provider, MD  naproxen (NAPROSYN) 500 MG tablet Take 500 mg by mouth 2 (two) times daily with a meal.   Yes Historical Provider, MD  omeprazole (PRILOSEC) 40 MG capsule Take 40 mg by mouth daily.   Yes Historical Provider, MD  pravastatin (PRAVACHOL) 40 MG tablet Take 40 mg by mouth daily.   Yes Historical Provider, MD  pregabalin (LYRICA) 100 MG capsule Take 100 mg by mouth 3 (three) times daily.   Yes Historical Provider, MD  topiramate (TOPAMAX) 50 MG tablet Take 50 mg by mouth 2 (two) times daily as needed. For nerve pain   Yes Historical Provider, MD  traMADol (ULTRAM) 50 MG tablet Take 100 mg by mouth 2 (two) times daily as needed for pain.   Yes Historical Provider, MD     Review of Systems  Positive ROS: As above, the patient has chronic back pain.  All other systems have been reviewed and were otherwise negative with the exception of those mentioned in the HPI and as above.  Objective: Vital signs in last 24 hours: Temp:  [97.9 F (36.6 C)] 97.9 F (36.6 C) (02/24 0824) Pulse Rate:  [74]  74 (02/24 0824) Resp:  [20] 20 (02/24 0824) BP: (155-164)/(95-111) 155/95 mmHg (02/24 0911) SpO2:  [95 %] 95 % (02/24 0824)  General Appearance: Alert, cooperative, no distress, appears stated age Head: Normocephalic, without obvious abnormality, atraumatic Eyes: PERRL, conjunctiva/corneas clear, EOM's intact, fundi benign, both eyes      Ears: Normal TM's and external ear canals, both ears Throat: Lips, mucosa, and tongue normal; teeth and gums normal Neck: Supple, symmetrical, trachea midline, no adenopathy; thyroid: No tenderness/nodules; no carotid bruit or JVD. The patient does have a large os 2 cervical subcutaneous mass consistent with a lipoma. Back: Symmetric, no curvature, ROM normal, no CVA tenderness. The patient's lumbar incision is  well-healed. Lungs: Clear to auscultation bilaterally, respirations unlabored Heart: Regular rate and rhythm, S1 and S2 normal, no murmur, rub or gallop Abdomen: Soft, non-tender, bowel sounds active all four quadrants, no masses, no organomegaly Extremities: Extremities normal, atraumatic, no cyanosis or edema Pulses: 2+ and symmetric all extremities Skin: Skin color, texture, turgor normal, no rashes or lesions  NEUROLOGIC:   Mental status: alert and oriented, no aphasia, good attention span, Fund of knowledge/ memory ok Motor Exam - grossly normal, except he gives away to pain occasionally Sensory Exam - grossly normal Reflexes: Symmetric Coordination - grossly normal Gait - grossly normal Balance - grossly normal Cranial Nerves: I: smell Not tested  II: visual acuity  OS: Normal    OD: Normal   II: visual fields Full to confrontation  II: pupils Equal, round, reactive to light  III,VII: ptosis None  III,IV,VI: extraocular muscles  Full ROM  V: mastication Normal  V: facial light touch sensation  Normal  V,VII: corneal reflex  Present  VII: facial muscle function - upper  Normal  VII: facial muscle function - lower Normal  VIII: hearing Not tested  IX: soft palate elevation  Normal  IX,X: gag reflex Present  XI: trapezius strength  5/5  XI: sternocleidomastoid strength 5/5  XI: neck flexion strength  5/5  XII: tongue strength  Normal    Data Review Lab Results  Component Value Date   WBC 10.6* 04/16/2012   HGB 14.1 04/16/2012   HCT 40.9 04/16/2012   MCV 79.6 04/16/2012   PLT 257 04/16/2012   Lab Results  Component Value Date   NA 142 04/16/2012   K 3.6 04/16/2012   CL 101 04/16/2012   CO2 28 04/16/2012   BUN 19 04/16/2012   CREATININE 1.06 04/16/2012   GLUCOSE 103* 04/16/2012   No results found for this basename: INR, PROTIME    Assessment/Plan: Posterior cervical lipoma: I discussed situation with the patient. We have discussed the various treatment options  including a removal of this lesion. I described the surgery to them. We have discussed the risks, benefits, alternatives, and likelihood of achieving our goals with surgery. I have answered all the patient's questions. He would like to proceed with an excision of this posterior cervical lipoma.   Ayriana Wix D 04/19/2012 9:59 AM

## 2012-04-19 NOTE — Op Note (Signed)
Brief history: The patient is a 57 year old black male who is complaining of a posterior cervical mass. He was worked up with a cervical MRI. This demonstrated the lesion to be consistent with a lipoma. I discussed the various treatment options including a excision of this presumed lipoma. The patient has weighed the risks, benefits, and alternatives surgery and decided to proceed with the operation.  Preop diagnosis: Posterior cervical lipoma  Postop diagnosis: The same  Procedure: Excision a posterior cervical lipoma  Surgeon: Dr. Delma Officer  Assistant: None  Anesthesia: General endotracheal  Specimens: Lipoma  Drains: None  Complications: None  Description of procedure: The patient was brought to the operating room by the anesthesia team. General endotracheal anesthesia was induced. The patient was turned to the prone position after I applied the [place] 3 point headrest to his calvarium. The patient's posterior cervical region was then prepared with Betadine scrub and Betadine solution. Sterile drapes were applied. I then injected the area to be incised with Marcaine with epinephrine solution. I then used a scalpel to make an incision over the patient's posterior cervical mass. I used electrocautery to dissect around this lesion. It appeared consistent with a lipoma. I obtained a gross total resection of the lesion. In order to make the skin edges not be redundant I incised/trim back approximately 3/4 of an inch from the wound edges bilaterally. We obtained hemostasis using bipolar cautery. I removed the retractors. We then reapproximated patient's subcutaneous tissue with interrupted 2-0 Vicryl suture. We reapproximated the skin with Steri-Strips and benzoin. The wound was then coated with bacitracin ointment. A sterile dressing was applied. The drapes were removed. The patient was subtotally returned to the supine position. I then removed the [place] 3 point headrest. The patient was  subselected exudative by the anesthesia team and transported to the post anesthesia care unit in stable condition. All sponge instrument and needle counts were reportedly correct at the end this case.

## 2012-04-19 NOTE — Preoperative (Signed)
Beta Blockers   Reason not to administer Beta Blockers:Not Applicable 

## 2012-04-19 NOTE — Progress Notes (Signed)
Faxed bus pass for [employer] coverage/ transportation.

## 2012-04-19 NOTE — Anesthesia Preprocedure Evaluation (Addendum)
Anesthesia Evaluation  Patient identified by MRN, date of birth, ID band Patient awake    Reviewed: Allergy & Precautions, H&P , NPO status , Patient's Chart, lab work & pertinent test results  Airway Mallampati: II  Neck ROM: full    Dental  (+) Teeth Intact and Dental Advidsory Given   Pulmonary          Cardiovascular hypertension,     Neuro/Psych  Neuromuscular disease    GI/Hepatic GERD-  ,  Endo/Other  diabetes, Type 2obese  Renal/GU      Musculoskeletal  (+) Arthritis -,   Abdominal   Peds  Hematology   Anesthesia Other Findings   Reproductive/Obstetrics                          Anesthesia Physical Anesthesia Plan  ASA: II  Anesthesia Plan: General   Post-op Pain Management:    Induction: Intravenous  Airway Management Planned: Oral ETT  Additional Equipment:   Intra-op Plan:   Post-operative Plan: Extubation in OR  Informed Consent: I have reviewed the patients History and Physical, chart, labs and discussed the procedure including the risks, benefits and alternatives for the proposed anesthesia with the patient or authorized representative who has indicated his/her understanding and acceptance.   Dental Advisory Given  Plan Discussed with: CRNA, Surgeon and Anesthesiologist  Anesthesia Plan Comments:        Anesthesia Quick Evaluation

## 2012-04-19 NOTE — Anesthesia Postprocedure Evaluation (Signed)
Anesthesia Post Note  Patient: Thomas Mcclure  Procedure(s) Performed: Procedure(s) (LRB): removal of posterior cervical lipoma (N/A)  Anesthesia type: General  Patient location: PACU  Post pain: Pain level controlled and Adequate analgesia  Post assessment: Post-op Vital signs reviewed, Patient's Cardiovascular Status Stable, Respiratory Function Stable, Patent Airway and Pain level controlled  Last Vitals:  Filed Vitals:   04/19/12 1230  BP: 142/88  Pulse: 64  Temp:   Resp: 13    Post vital signs: Reviewed and stable  Level of consciousness: awake, alert  and oriented  Complications: No apparent anesthesia complications

## 2012-04-19 NOTE — Anesthesia Procedure Notes (Signed)
Procedure Name: Intubation Date/Time: 04/19/2012 10:17 AM Performed by: Carmela Rima Pre-anesthesia Checklist: Patient identified, Timeout performed, Emergency Drugs available, Suction available and Patient being monitored Patient Re-evaluated:Patient Re-evaluated prior to inductionOxygen Delivery Method: Circle system utilized Preoxygenation: Pre-oxygenation with 100% oxygen Intubation Type: IV induction Ventilation: Mask ventilation without difficulty Laryngoscope Size: Mac and 4 Grade View: Grade II Tube type: Oral Tube size: 7.5 mm Number of attempts: 1 Placement Confirmation: ETT inserted through vocal cords under direct vision,  positive ETCO2 and breath sounds checked- equal and bilateral Secured at: 23 cm Tube secured with: Tape Dental Injury: Teeth and Oropharynx as per pre-operative assessment

## 2012-04-20 ENCOUNTER — Encounter (HOSPITAL_COMMUNITY): Payer: Self-pay | Admitting: Neurosurgery

## 2012-06-18 DIAGNOSIS — G8929 Other chronic pain: Secondary | ICD-10-CM | POA: Insufficient documentation

## 2012-06-18 DIAGNOSIS — K219 Gastro-esophageal reflux disease without esophagitis: Secondary | ICD-10-CM | POA: Insufficient documentation

## 2012-06-18 DIAGNOSIS — M5416 Radiculopathy, lumbar region: Secondary | ICD-10-CM

## 2012-07-26 ENCOUNTER — Emergency Department (HOSPITAL_COMMUNITY): Payer: Medicare Other

## 2012-07-26 ENCOUNTER — Encounter (HOSPITAL_COMMUNITY): Payer: Self-pay | Admitting: Nurse Practitioner

## 2012-07-26 ENCOUNTER — Emergency Department (HOSPITAL_COMMUNITY)
Admission: EM | Admit: 2012-07-26 | Discharge: 2012-07-26 | Disposition: A | Discharge status: Home / Self Care | Payer: Medicare Other | Attending: Emergency Medicine | Admitting: Emergency Medicine

## 2012-07-26 DIAGNOSIS — G8929 Other chronic pain: Secondary | ICD-10-CM | POA: Insufficient documentation

## 2012-07-26 DIAGNOSIS — Z7982 Long term (current) use of aspirin: Secondary | ICD-10-CM | POA: Insufficient documentation

## 2012-07-26 DIAGNOSIS — Z8669 Personal history of other diseases of the nervous system and sense organs: Secondary | ICD-10-CM | POA: Insufficient documentation

## 2012-07-26 DIAGNOSIS — M5441 Lumbago with sciatica, right side: Secondary | ICD-10-CM

## 2012-07-26 DIAGNOSIS — Z87891 Personal history of nicotine dependence: Secondary | ICD-10-CM | POA: Insufficient documentation

## 2012-07-26 DIAGNOSIS — I10 Essential (primary) hypertension: Secondary | ICD-10-CM | POA: Insufficient documentation

## 2012-07-26 DIAGNOSIS — M545 Low back pain, unspecified: Secondary | ICD-10-CM | POA: Insufficient documentation

## 2012-07-26 DIAGNOSIS — Z79899 Other long term (current) drug therapy: Secondary | ICD-10-CM | POA: Insufficient documentation

## 2012-07-26 DIAGNOSIS — M5430 Sciatica, unspecified side: Secondary | ICD-10-CM | POA: Insufficient documentation

## 2012-07-26 DIAGNOSIS — E119 Type 2 diabetes mellitus without complications: Secondary | ICD-10-CM | POA: Insufficient documentation

## 2012-07-26 DIAGNOSIS — K219 Gastro-esophageal reflux disease without esophagitis: Secondary | ICD-10-CM | POA: Insufficient documentation

## 2012-07-26 DIAGNOSIS — E785 Hyperlipidemia, unspecified: Secondary | ICD-10-CM | POA: Insufficient documentation

## 2012-07-26 DIAGNOSIS — Z8739 Personal history of other diseases of the musculoskeletal system and connective tissue: Secondary | ICD-10-CM | POA: Insufficient documentation

## 2012-07-26 DIAGNOSIS — R55 Syncope and collapse: Secondary | ICD-10-CM | POA: Insufficient documentation

## 2012-07-26 LAB — BASIC METABOLIC PANEL
BUN: 15 mg/dL (ref 6–23)
CO2: 24 mEq/L (ref 19–32)
Calcium: 9.9 mg/dL (ref 8.4–10.5)
Chloride: 98 mEq/L (ref 96–112)
Creatinine, Ser: 1.04 mg/dL (ref 0.50–1.35)
GFR calc Af Amer: >90 mL/min (ref >90)
GFR calc non Af Amer: 78 mL/min — ABNORMAL LOW (ref >90)
Glucose, Bld: 131 mg/dL — ABNORMAL HIGH (ref 70–99)
Potassium: 3.8 mEq/L (ref 3.5–5.1)
Sodium: 136 mEq/L (ref 135–145)

## 2012-07-26 LAB — CBC WITH DIFFERENTIAL/PLATELET
Basophils Absolute: 0 10*3/uL (ref 0.0–0.1)
Basophils Relative: 0 % (ref 0–1)
Eosinophils Absolute: 0.1 10*3/uL (ref 0.0–0.7)
Eosinophils Relative: 1 % (ref 0–5)
HCT: 41.5 % (ref 39.0–52.0)
Hemoglobin: 13.9 g/dL (ref 13.0–17.0)
Lymphocytes Relative: 10 % — ABNORMAL LOW (ref 12–46)
Lymphs Abs: 1.4 10*3/uL (ref 0.7–4.0)
MCH: 26.6 pg (ref 26.0–34.0)
MCHC: 33.5 g/dL (ref 30.0–36.0)
MCV: 79.3 fL (ref 78.0–100.0)
Monocytes Absolute: 0.4 10*3/uL (ref 0.1–1.0)
Monocytes Relative: 3 % (ref 3–12)
Neutro Abs: 12.2 10*3/uL — ABNORMAL HIGH (ref 1.7–7.7)
Neutrophils Relative %: 86 % — ABNORMAL HIGH (ref 43–77)
Platelets: 281 10*3/uL (ref 150–400)
RBC: 5.23 MIL/uL (ref 4.22–5.81)
RDW: 15.7 % — ABNORMAL HIGH (ref 11.5–15.5)
WBC: 14.2 10*3/uL — ABNORMAL HIGH (ref 4.0–10.5)

## 2012-07-26 LAB — URINALYSIS, ROUTINE W REFLEX MICROSCOPIC
Bilirubin Urine: NEGATIVE
Glucose, UA: NEGATIVE mg/dL
Hgb urine dipstick: NEGATIVE
Ketones, ur: NEGATIVE mg/dL
Leukocytes, UA: NEGATIVE
Nitrite: NEGATIVE
Protein, ur: NEGATIVE mg/dL
Specific Gravity, Urine: 1.019 (ref 1.005–1.030)
Urobilinogen, UA: 1 mg/dL (ref 0.0–1.0)
pH: 8 (ref 5.0–8.0)

## 2012-07-26 LAB — POCT I-STAT TROPONIN I: Troponin i, poc: 0.03 ng/mL (ref 0.00–0.08)

## 2012-07-26 MED ORDER — SODIUM CHLORIDE 0.9 % IV SOLN
INTRAVENOUS | Status: DC
  Administered 2012-07-26: 13:00:00 via INTRAVENOUS

## 2012-07-26 MED ORDER — OXYCODONE-ACETAMINOPHEN 5-325 MG PO TABS
1.0000 | ORAL_TABLET | ORAL | Status: DC | PRN
Start: 2012-07-26 — End: 2012-09-06

## 2012-07-26 MED ORDER — GADOBENATE DIMEGLUMINE 529 MG/ML IV SOLN
20.0000 mL | Freq: Once | INTRAVENOUS | Status: AC | PRN
  Administered 2012-07-26: 20 mL via INTRAVENOUS

## 2012-07-26 MED ORDER — OXYCODONE-ACETAMINOPHEN 5-325 MG PO TABS
1.0000 | ORAL_TABLET | Freq: Once | ORAL | Status: AC
  Administered 2012-07-26: 1 via ORAL
  Filled 2012-07-26: qty 1

## 2012-07-26 NOTE — ED Notes (Signed)
Per EMS pt was at [facility]- f/up with hip pain. Pt got shot of toradol and solumederol in glut- pt begun to brady down, became diaphoretic and decrease in LOC. Pt did not have LOC

## 2012-07-26 NOTE — ED Provider Notes (Signed)
History     CSN: [id number]  Arrival date & time 07/26/12  1141   First MD Initiated Contact with Patient 07/26/12 1206      Chief Complaint  Patient presents with  . near syncope     (Consider location/radiation/quality/duration/timing/severity/associated sxs/prior treatment) Patient is a 57 y.o. male presenting with syncope. The history is provided by the patient. No language interpreter was used.  Loss of Consciousness Episode history:  Single (Pt had low back pain that radiated into the right leg.  He was seen at [facility] and had been given an injection of steroid and toradol.  He broke out in a sweat and nearly fainted.  He was therefore sent to [facility] via EMS for evaluation.) Most recent episode:  Today Duration: He felt faint for ten to fifteen minutes. Timing:  Constant Progression:  Resolved Chronicity:  New Context comment:  Was given an IM injection. Witnessed: yes   Relieved by:  Nothing Worsened by:  Nothing tried Ineffective treatments:  None tried Associated symptoms: no chest pain, no fever, no palpitations and no shortness of breath     Past Medical History  Diagnosis Date  . Arthritis     Rheumatoid  . Diabetes mellitus     diet controlled  . GERD (gastroesophageal reflux disease)   . Hyperlipidemia   . Hypertension     Dr. Quitman Livings [phone]  . Chronic back pain   . Neuromuscular disorder     "with nerve damage"    Past Surgical History  Procedure Laterality Date  . Lumbar spine surgery  03/2009  & 2012  . Wisdom tooth extraction      hx of  . Mass excision N/A 04/19/2012    Procedure: removal of posterior cervical lipoma;  Surgeon: Cristi Loron, MD;  Location: [facility];  Service: Neurosurgery;  Laterality: N/A;  Removal of posterior cervical lipoma    Family History  Problem Relation Age of Onset  . Heart disease Mother   . Prostate cancer Brother     History  Substance Use Topics  . Smoking status: Former Games developer   . Smokeless tobacco: Never Used  . Alcohol Use: 0.6 oz/week    1 Cans of beer per week      Review of Systems  Constitutional: Negative.  Negative for fever and chills.  HENT: Negative.   Eyes: Negative.   Respiratory: Negative.  Negative for shortness of breath and wheezing.   Cardiovascular: Positive for syncope. Negative for chest pain, palpitations and leg swelling.       Near syncope   Gastrointestinal: Negative.   Genitourinary: Negative.   Musculoskeletal: Positive for back pain.  Skin: Negative.   Neurological: Negative.   Psychiatric/Behavioral: Negative.     Allergies  Adhesive  Home Medications   Current Outpatient Rx  Name  Route  Sig  Dispense  Refill  . aspirin 325 MG tablet   Oral   Take 325 mg by mouth daily.         . cyclobenzaprine (FLEXERIL) 10 MG tablet   Oral   Take 10 mg by mouth 3 (three) times daily as needed. For muscle spasm         . diazepam (VALIUM) 5 MG tablet   Oral   Take 5 mg by mouth 2 (two) times daily.         . DULoxetine (CYMBALTA) 60 MG capsule   Oral   Take 60 mg by mouth daily.         Marland Kitchen  hydrochlorothiazide (HYDRODIURIL) 25 MG tablet   Oral   Take 25 mg by mouth daily.         Marland Kitchen lisinopril (PRINIVIL,ZESTRIL) 20 MG tablet   Oral   Take 20 mg by mouth daily.         . meloxicam (MOBIC) 15 MG tablet   Oral   Take 15 mg by mouth daily.         . Menthol, Topical Analgesic, (BENGAY EX)   Apply externally   Apply 1 application topically daily as needed (for muscle pain).         Marland Kitchen omeprazole (PRILOSEC) 40 MG capsule   Oral   Take 40 mg by mouth daily.         . pravastatin (PRAVACHOL) 40 MG tablet   Oral   Take 40 mg by mouth daily.         . pregabalin (LYRICA) 100 MG capsule   Oral   Take 100 mg by mouth 3 (three) times daily.         Marland Kitchen topiramate (TOPAMAX) 50 MG tablet   Oral   Take 50 mg by mouth 2 (two) times daily as needed. For nerve pain         . traMADol (ULTRAM) 50 MG  tablet   Oral   Take 100 mg by mouth 2 (two) times daily as needed for pain.         Marland Kitchen oxyCODONE-acetaminophen (PERCOCET/ROXICET) 5-325 MG per tablet   Oral   Take 1 tablet by mouth every 4 (four) hours as needed for pain.   20 tablet   0     BP 112/87  Pulse 89  Temp(Src) 97.8 F (36.6 C) (Oral)  Resp 15  SpO2 100%  Physical Exam  Nursing note and vitals reviewed. Constitutional: He is oriented to person, place, and time.  Robust man in mild distress, main current complaint is back pain.    HENT:  Head: Normocephalic and atraumatic.  Right Ear: External ear normal.  Left Ear: External ear normal.  Mouth/Throat: Oropharynx is clear and moist.  Eyes: Conjunctivae and EOM are normal. Pupils are equal, round, and reactive to light.  Neck: Normal range of motion. Neck supple.  Cardiovascular: Normal rate, regular rhythm and normal heart sounds.   Pulmonary/Chest: Effort normal and breath sounds normal.  Abdominal: Soft. Bowel sounds are normal.  Musculoskeletal:  He localizes his pain to the right lumbar region going into his right leg.  There is no palpable deformity of his back; he has full ROM of the right hip, knee and ankle.    Neurological: He is alert and oriented to person, place, and time.  No sensory or motor deficit.  Skin: Skin is warm and dry.  Psychiatric: He has a normal mood and affect. His behavior is normal.    ED Course  Procedures (including critical care time)  4:22 PM Results for orders placed during the hospital encounter of 07/26/12  CBC WITH DIFFERENTIAL      Result Value Range   WBC 14.2 (*) 4.0 - 10.5 K/uL   RBC 5.23  4.22 - 5.81 MIL/uL   Hemoglobin 13.9  13.0 - 17.0 g/dL   HCT 16.1  09.6 - 04.5 %   MCV 79.3  78.0 - 100.0 fL   MCH 26.6  26.0 - 34.0 pg   MCHC 33.5  30.0 - 36.0 g/dL   RDW 40.9 (*) 81.1 - 91.4 %   Platelets 281  150 -  400 K/uL   Neutrophils Relative % 86 (*) 43 - 77 %   Neutro Abs 12.2 (*) 1.7 - 7.7 K/uL   Lymphocytes  Relative 10 (*) 12 - 46 %   Lymphs Abs 1.4  0.7 - 4.0 K/uL   Monocytes Relative 3  3 - 12 %   Monocytes Absolute 0.4  0.1 - 1.0 K/uL   Eosinophils Relative 1  0 - 5 %   Eosinophils Absolute 0.1  0.0 - 0.7 K/uL   Basophils Relative 0  0 - 1 %   Basophils Absolute 0.0  0.0 - 0.1 K/uL  BASIC METABOLIC PANEL      Result Value Range   Sodium 136  135 - 145 mEq/L   Potassium 3.8  3.5 - 5.1 mEq/L   Chloride 98  96 - 112 mEq/L   CO2 24  19 - 32 mEq/L   Glucose, Bld 131 (*) 70 - 99 mg/dL   BUN 15  6 - 23 mg/dL   Creatinine, Ser 1.61  0.50 - 1.35 mg/dL   Calcium 9.9  8.4 - 09.6 mg/dL   GFR calc non Af Amer 78 (*) >90 mL/min   GFR calc Af Amer >90  >90 mL/min  URINALYSIS, ROUTINE W REFLEX MICROSCOPIC      Result Value Range   Color, Urine YELLOW  YELLOW   APPearance CLEAR  CLEAR   Specific Gravity, Urine 1.019  1.005 - 1.030   pH 8.0  5.0 - 8.0   Glucose, UA NEGATIVE  NEGATIVE mg/dL   Hgb urine dipstick NEGATIVE  NEGATIVE   Bilirubin Urine NEGATIVE  NEGATIVE   Ketones, ur NEGATIVE  NEGATIVE mg/dL   Protein, ur NEGATIVE  NEGATIVE mg/dL   Urobilinogen, UA 1.0  0.0 - 1.0 mg/dL   Nitrite NEGATIVE  NEGATIVE   Leukocytes, UA NEGATIVE  NEGATIVE  POCT I-STAT TROPONIN I      Result Value Range   Troponin i, poc 0.03  0.00 - 0.08 ng/mL   Comment 3            Mr Lumbar Spine W Wo Contrast  07/26/2012   *RADIOLOGY REPORT*  Clinical Data: Severe right-sided low back pain and right leg pain.  MRI LUMBAR SPINE WITHOUT AND WITH CONTRAST  Technique:  Multiplanar and multiecho pulse sequences of the lumbar spine were obtained without and with intravenous contrast.  Contrast: 20mL MULTIHANCE GADOBENATE DIMEGLUMINE 529 MG/ML IV SOLN  Comparison: CT myelogram dated 05/16/2011 and lumbar MRI dated 03/18/2011  Findings: Conus tip at L1.  The no significant abnormality of the paraspinal soft tissues.  T12-L1 and L1-2 and L2-3:  The discs are normal. Epidural fat has increased in each of those levels with  secondary compression of the thecal sac.  Increased bilateral facet arthritis at L2-3. Bilateral foraminal stenosis has slightly increased at L2-3.  L3-4: Interbody and posterior fusion. Enhancing scar around the thecal sac to the expected degree.  No neural impingement.  L4-5:  Interbody and posterior fusion. Posterior decompression with this enhancing scarring around the thecal sac and nerve roots to the expected degree.  No neural impingement.  L5 S1:  Interbody and posterior fusion.  Scarring around the thecal sac to the expected degree.  No change since the prior exam. Enhancing scar tissue in both neural foramina.  IMPRESSION:  1.  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 2. No new or residual impingement at the operative levels. 3. Increased epidural fat at L1-2 and L2-3.  Original Report Authenticated By: Francene Boyers, M.D.   Course in ED:  Pt was seen and had physical examination.  Lab workup related to syncope was all negative.  MRI of back showed no lesions requiring immediate surgical attention.  Rx percocet for pain, followup of his back pain with his neurosurgeon, Tressie Stalker, M.D.     1. Near syncope   2. Low back pain on right side with sciatica           Carleene Cooper III, MD 07/27/12 [phone]

## 2012-09-06 ENCOUNTER — Emergency Department (HOSPITAL_COMMUNITY)
Admission: EM | Admit: 2012-09-06 | Discharge: 2012-09-06 | Disposition: A | Discharge status: Home / Self Care | Payer: Medicare Other | Attending: Emergency Medicine | Admitting: Emergency Medicine

## 2012-09-06 ENCOUNTER — Encounter (HOSPITAL_COMMUNITY): Payer: Self-pay | Admitting: Emergency Medicine

## 2012-09-06 ENCOUNTER — Emergency Department (HOSPITAL_COMMUNITY): Payer: Medicare Other

## 2012-09-06 DIAGNOSIS — M7989 Other specified soft tissue disorders: Secondary | ICD-10-CM

## 2012-09-06 DIAGNOSIS — Y939 Activity, unspecified: Secondary | ICD-10-CM | POA: Insufficient documentation

## 2012-09-06 DIAGNOSIS — M79642 Pain in left hand: Secondary | ICD-10-CM

## 2012-09-06 DIAGNOSIS — S6990XA Unspecified injury of unspecified wrist, hand and finger(s), initial encounter: Secondary | ICD-10-CM | POA: Insufficient documentation

## 2012-09-06 DIAGNOSIS — E785 Hyperlipidemia, unspecified: Secondary | ICD-10-CM | POA: Insufficient documentation

## 2012-09-06 DIAGNOSIS — E119 Type 2 diabetes mellitus without complications: Secondary | ICD-10-CM | POA: Insufficient documentation

## 2012-09-06 DIAGNOSIS — Z87891 Personal history of nicotine dependence: Secondary | ICD-10-CM | POA: Insufficient documentation

## 2012-09-06 DIAGNOSIS — Z79899 Other long term (current) drug therapy: Secondary | ICD-10-CM | POA: Insufficient documentation

## 2012-09-06 DIAGNOSIS — M069 Rheumatoid arthritis, unspecified: Secondary | ICD-10-CM | POA: Insufficient documentation

## 2012-09-06 DIAGNOSIS — I10 Essential (primary) hypertension: Secondary | ICD-10-CM | POA: Insufficient documentation

## 2012-09-06 DIAGNOSIS — W010XXA Fall on same level from slipping, tripping and stumbling without subsequent striking against object, initial encounter: Secondary | ICD-10-CM | POA: Insufficient documentation

## 2012-09-06 DIAGNOSIS — Y929 Unspecified place or not applicable: Secondary | ICD-10-CM | POA: Insufficient documentation

## 2012-09-06 DIAGNOSIS — G709 Myoneural disorder, unspecified: Secondary | ICD-10-CM | POA: Insufficient documentation

## 2012-09-06 DIAGNOSIS — Z7982 Long term (current) use of aspirin: Secondary | ICD-10-CM | POA: Insufficient documentation

## 2012-09-06 DIAGNOSIS — G8929 Other chronic pain: Secondary | ICD-10-CM | POA: Insufficient documentation

## 2012-09-06 DIAGNOSIS — M549 Dorsalgia, unspecified: Secondary | ICD-10-CM | POA: Insufficient documentation

## 2012-09-06 DIAGNOSIS — K219 Gastro-esophageal reflux disease without esophagitis: Secondary | ICD-10-CM | POA: Insufficient documentation

## 2012-09-06 MED ORDER — HYDROCODONE-ACETAMINOPHEN 5-325 MG PO TABS
2.0000 | ORAL_TABLET | Freq: Once | ORAL | Status: AC
  Administered 2012-09-06: 2 via ORAL
  Filled 2012-09-06: qty 2

## 2012-09-06 MED ORDER — HYDROCODONE-ACETAMINOPHEN 5-325 MG PO TABS: 1.0000 | ORAL_TABLET | ORAL | Status: DC | PRN

## 2012-09-06 NOTE — ED Provider Notes (Signed)
History    CSN: [id number] Arrival date & time 09/06/12  1109  First MD Initiated Contact with Patient 09/06/12 1119     Chief Complaint  Patient presents with  . Hand Pain  . Fall   (Consider location/radiation/quality/duration/timing/severity/associated sxs/prior Treatment) HPI Comments: 57 year old male who presents for left hand pain and swelling after a mechanical fall yesterday. Patient states he tripped on a tarp and reached his left hand out in front of him to catch himself subsequently causing pain and swelling of his left hand. Patient denies fevers, pallor, numbness or tingling, and weakness.  Patient is a 57 y.o. male presenting with hand pain and fall. The history is provided by the patient. No language interpreter was used.  Hand Pain This is a new problem. The current episode started yesterday. The problem occurs constantly. The problem has been gradually worsening. Associated symptoms include joint swelling. Pertinent negatives include no chest pain, fever, numbness or weakness. Exacerbated by: leaving hand at side and palpation. Treatments tried: elevation and tramadol. The treatment provided mild relief.  Fall Associated symptoms include joint swelling. Pertinent negatives include no chest pain, fever, numbness or weakness.   Past Medical History  Diagnosis Date  . Arthritis     Rheumatoid  . Diabetes mellitus     diet controlled  . GERD (gastroesophageal reflux disease)   . Hyperlipidemia   . Hypertension     Dr. Quitman Livings [phone]  . Chronic back pain   . Neuromuscular disorder     "with nerve damage"   Past Surgical History  Procedure Laterality Date  . Lumbar spine surgery  03/2009  & 2012  . Wisdom tooth extraction      hx of  . Mass excision N/A 04/19/2012    Procedure: removal of posterior cervical lipoma;  Surgeon: Cristi Loron, MD;  Location: [facility];  Service: Neurosurgery;  Laterality: N/A;  Removal of posterior cervical lipoma    Family History  Problem Relation Age of Onset  . Heart disease Mother   . Prostate cancer Brother    History  Substance Use Topics  . Smoking status: Former Games developer  . Smokeless tobacco: Never Used  . Alcohol Use: 0.6 oz/week    1 Cans of beer per week    Review of Systems  Constitutional: Negative for fever.  Respiratory: Negative for shortness of breath.   Cardiovascular: Negative for chest pain.  Musculoskeletal: Positive for joint swelling.  Skin: Negative for color change and pallor.  Neurological: Negative for dizziness, weakness, light-headedness and numbness.  All other systems reviewed and are negative.    Allergies  Adhesive  Home Medications   Current Outpatient Rx  Name  Route  Sig  Dispense  Refill  . aspirin 325 MG tablet   Oral   Take 325 mg by mouth daily.         . cyclobenzaprine (FLEXERIL) 10 MG tablet   Oral   Take 10 mg by mouth 3 (three) times daily as needed. For muscle spasm         . diazepam (VALIUM) 10 MG tablet   Oral   Take 10 mg by mouth 2 (two) times daily.         . DULoxetine (CYMBALTA) 60 MG capsule   Oral   Take 60 mg by mouth daily.         . hydrochlorothiazide (HYDRODIURIL) 25 MG tablet   Oral   Take 25 mg by mouth daily.         Marland Kitchen  lisinopril (PRINIVIL,ZESTRIL) 20 MG tablet   Oral   Take 20 mg by mouth daily.         . meloxicam (MOBIC) 15 MG tablet   Oral   Take 15 mg by mouth daily.         . Menthol, Topical Analgesic, (BENGAY EX)   Apply externally   Apply 1 application topically daily as needed (for muscle pain).         Marland Kitchen omeprazole (PRILOSEC) 40 MG capsule   Oral   Take 40 mg by mouth daily.         . pravastatin (PRAVACHOL) 40 MG tablet   Oral   Take 40 mg by mouth daily.         . pregabalin (LYRICA) 100 MG capsule   Oral   Take 100 mg by mouth 3 (three) times daily.         Marland Kitchen topiramate (TOPAMAX) 50 MG tablet   Oral   Take 50 mg by mouth 2 (two) times daily as needed.  For nerve pain         . traMADol (ULTRAM) 50 MG tablet   Oral   Take 100 mg by mouth 2 (two) times daily as needed for pain.         Marland Kitchen HYDROcodone-acetaminophen (NORCO/VICODIN) 5-325 MG per tablet   Oral   Take 1 tablet by mouth every 4 (four) hours as needed for pain.   12 tablet   0    BP 169/113  Pulse 95  Temp(Src) 97.8 F (36.6 C) (Oral)  Resp 18  SpO2 99% Physical Exam  Nursing note and vitals reviewed. Constitutional: He is oriented to person, place, and time. He appears well-developed and well-nourished. No distress.  HENT:  Head: Normocephalic and atraumatic.  Eyes: Conjunctivae are normal. No scleral icterus.  Neck: Normal range of motion.  Cardiovascular: Normal rate, regular rhythm and intact distal pulses.   Distal radial pulses 2+ bilaterally. Capillary refill normal.  Pulmonary/Chest: Effort normal. No respiratory distress.  Musculoskeletal:       Left hand: He exhibits decreased range of motion (Secondary to swelling), tenderness, bony tenderness and swelling. He exhibits normal capillary refill, no deformity and no laceration. Normal sensation noted. Normal strength noted.       Hands: Neurological: He is alert and oriented to person, place, and time.  No sensory or motor deficits appreciated.  Skin: Skin is warm and dry. No rash noted. He is not diaphoretic. No erythema. No pallor.  Psychiatric: He has a normal mood and affect. His behavior is normal.   ED Course  Procedures (including critical care time) Labs Reviewed - No data to display Dg Wrist Complete Left  09/06/2012   *RADIOLOGY REPORT*  Clinical Data: Pain and swelling left hand post fall, history diabetes, hypertension, arthritis  LEFT WRIST - COMPLETE 3+ VIEW  Comparison: None  Findings: Minimal radiocarpal joint space narrowing. Osseous mineralization normal. Remaining joint spaces preserved. No acute fracture, dislocation, or bone destruction. Mild dorsal soft tissue swelling at wrist.   IMPRESSION: No acute osseous abnormalities.   Original Report Authenticated By: Ulyses Southward, M.D.   Dg Hand Complete Left  09/06/2012   *RADIOLOGY REPORT*  Clinical Data: Pain and swelling left hand post fall, history diabetes, hypertension, arthritis  LEFT HAND - COMPLETE 3+ VIEW  Comparison: None  Findings: Fingers mildly flexed on all views with superimposition of fingers on lateral view, limiting assessment. Osseous mineralization normal. Joint spaces preserved. Small dorsal  spur at IP joint left thumb. Diffuse soft tissue swelling. No definite acute fracture, dislocation, or bone destruction.  IMPRESSION: No definite acute osseous abnormalities.   Original Report Authenticated By: Ulyses Southward, M.D.   1. Hand pain, left   2. Swelling of hand   3. Hypertension    MDM  Left hand pain and swelling secondary to mechanical fall yesterday. Physical exam as above. Patient neurovascularly intact. Finger to thumb opposition intact; some difficulty with thumb to 5th finger opposition 2/2 swelling. X-ray with no evidence of fracture or dislocation in the hand or wrist. There is no pallor, pulselessness, poikilothermia, or paresthesias to suspect complicating injury. No erythema or heat-to-touch to suspect infectious joint process. Patient appropriate for discharge with hand specialist followup for further evaluation symptoms. RICE instruction provided and prescription for Norco given for severe pain. Patient hypertension is in the emergency department; asymptomatic. Followup with patient's primary care provider recommended for blood pressure recheck in 24-48 hours. Patient verbalizes comfort and understanding with this discharge plan with no unaddressed concerns.  Antony Madura, PA-C 09/06/12 1733

## 2012-09-06 NOTE — ED Notes (Signed)
Pt. Stated, i tripped over a tarp and caught myself with my left hand.

## 2012-09-07 NOTE — ED Provider Notes (Signed)
Medical screening examination/treatment/procedure(s) were performed by non-physician practitioner and as supervising physician I was immediately available for consultation/collaboration.   Ashby Dawes, MD 09/07/12 [phone]

## 2012-09-10 ENCOUNTER — Telehealth (HOSPITAL_COMMUNITY): Payer: Self-pay | Admitting: Emergency Medicine

## 2012-10-18 ENCOUNTER — Ambulatory Visit: Payer: Medicare Other | Attending: Anesthesiology | Admitting: Physical Therapy

## 2012-10-18 DIAGNOSIS — R262 Difficulty in walking, not elsewhere classified: Secondary | ICD-10-CM | POA: Insufficient documentation

## 2012-10-18 DIAGNOSIS — M545 Low back pain, unspecified: Secondary | ICD-10-CM | POA: Insufficient documentation

## 2012-10-18 DIAGNOSIS — IMO0001 Reserved for inherently not codable concepts without codable children: Secondary | ICD-10-CM | POA: Insufficient documentation

## 2012-10-18 DIAGNOSIS — R5381 Other malaise: Secondary | ICD-10-CM | POA: Insufficient documentation

## 2012-10-19 ENCOUNTER — Encounter (HOSPITAL_COMMUNITY): Payer: Self-pay | Admitting: *Deleted

## 2012-10-19 ENCOUNTER — Other Ambulatory Visit (HOSPITAL_COMMUNITY): Payer: Self-pay | Admitting: Nurse Practitioner

## 2012-10-19 ENCOUNTER — Ambulatory Visit (HOSPITAL_COMMUNITY)
Admission: RE | Admit: 2012-10-19 | Discharge: 2012-10-19 | Disposition: A | Discharge status: Home / Self Care | Payer: Medicare Other | Source: Ambulatory Visit | Attending: Nurse Practitioner | Admitting: Nurse Practitioner

## 2012-10-19 ENCOUNTER — Emergency Department (HOSPITAL_COMMUNITY)
Admission: EM | Admit: 2012-10-19 | Discharge: 2012-10-19 | Discharge status: Against Medical Advice | Payer: Medicare Other | Attending: Emergency Medicine | Admitting: Emergency Medicine

## 2012-10-19 DIAGNOSIS — M79609 Pain in unspecified limb: Secondary | ICD-10-CM | POA: Insufficient documentation

## 2012-10-19 DIAGNOSIS — M7989 Other specified soft tissue disorders: Secondary | ICD-10-CM | POA: Insufficient documentation

## 2012-10-19 DIAGNOSIS — R609 Edema, unspecified: Secondary | ICD-10-CM

## 2012-10-19 DIAGNOSIS — E119 Type 2 diabetes mellitus without complications: Secondary | ICD-10-CM | POA: Insufficient documentation

## 2012-10-19 DIAGNOSIS — I10 Essential (primary) hypertension: Secondary | ICD-10-CM | POA: Insufficient documentation

## 2012-10-19 DIAGNOSIS — Z87891 Personal history of nicotine dependence: Secondary | ICD-10-CM | POA: Insufficient documentation

## 2012-10-19 NOTE — ED Notes (Signed)
Pt reports falling and twisting his left ankle 2 weeks ago and still having swelling that is going up his leg and pain to back of lower leg. Pt was sent for doppler today by pcp and it was negative for clot.

## 2012-10-19 NOTE — Progress Notes (Signed)
VASCULAR LAB PRELIMINARY  PRELIMINARY  PRELIMINARY  PRELIMINARY  Left lower extremity venous duplex completed.    Preliminary report:  Left:  No evidence of DVT or superficial thrombosis.  In the popliteal fossa and proximal calf there is an area of hypoechogenicity. This may represent a ruptured Baker's cyst or hematoma from injury 2 weeks ago.  Ladavion Savitz, RVT 10/19/2012, 4:58 PM

## 2012-10-20 ENCOUNTER — Encounter (HOSPITAL_COMMUNITY): Payer: Self-pay | Admitting: *Deleted

## 2012-10-20 ENCOUNTER — Emergency Department (HOSPITAL_COMMUNITY)
Admission: EM | Admit: 2012-10-20 | Discharge: 2012-10-20 | Disposition: A | Discharge status: Home / Self Care | Payer: Medicare Other | Attending: Emergency Medicine | Admitting: Emergency Medicine

## 2012-10-20 DIAGNOSIS — I10 Essential (primary) hypertension: Secondary | ICD-10-CM | POA: Insufficient documentation

## 2012-10-20 DIAGNOSIS — Z9889 Other specified postprocedural states: Secondary | ICD-10-CM | POA: Insufficient documentation

## 2012-10-20 DIAGNOSIS — M5430 Sciatica, unspecified side: Secondary | ICD-10-CM | POA: Insufficient documentation

## 2012-10-20 DIAGNOSIS — Z87891 Personal history of nicotine dependence: Secondary | ICD-10-CM | POA: Insufficient documentation

## 2012-10-20 DIAGNOSIS — M7120 Synovial cyst of popliteal space [Baker], unspecified knee: Secondary | ICD-10-CM | POA: Insufficient documentation

## 2012-10-20 DIAGNOSIS — Z79899 Other long term (current) drug therapy: Secondary | ICD-10-CM | POA: Insufficient documentation

## 2012-10-20 DIAGNOSIS — M79609 Pain in unspecified limb: Secondary | ICD-10-CM | POA: Insufficient documentation

## 2012-10-20 DIAGNOSIS — M069 Rheumatoid arthritis, unspecified: Secondary | ICD-10-CM | POA: Insufficient documentation

## 2012-10-20 DIAGNOSIS — G8929 Other chronic pain: Secondary | ICD-10-CM | POA: Insufficient documentation

## 2012-10-20 DIAGNOSIS — Z7982 Long term (current) use of aspirin: Secondary | ICD-10-CM | POA: Insufficient documentation

## 2012-10-20 DIAGNOSIS — K219 Gastro-esophageal reflux disease without esophagitis: Secondary | ICD-10-CM | POA: Insufficient documentation

## 2012-10-20 DIAGNOSIS — E785 Hyperlipidemia, unspecified: Secondary | ICD-10-CM | POA: Insufficient documentation

## 2012-10-20 DIAGNOSIS — Z8669 Personal history of other diseases of the nervous system and sense organs: Secondary | ICD-10-CM | POA: Insufficient documentation

## 2012-10-20 DIAGNOSIS — E119 Type 2 diabetes mellitus without complications: Secondary | ICD-10-CM | POA: Insufficient documentation

## 2012-10-20 DIAGNOSIS — M7989 Other specified soft tissue disorders: Secondary | ICD-10-CM | POA: Insufficient documentation

## 2012-10-20 NOTE — ED Provider Notes (Signed)
CSN: [id number]     Arrival date & time 10/20/12  6578 History   First MD Initiated Contact with Patient 10/20/12 [phone]     Chief Complaint  Patient presents with  . Leg Pain   (Consider location/radiation/quality/duration/timing/severity/associated sxs/prior Treatment) HPI Patient presents to the emergency department for followup of his lower extremity Doppler test that was done yesterday. He was unable to wait for the results because his bilateral sciatic was hurting too bad from sitting and waiting. Called his primary care doctor who said that he needed to return to the emergency department to have the results and managed. Patient has prepped off of his results with him which shows that he's got a hematoma for baker's cyst that has ruptured. No signs of DVT or superficial thrombophlebitis. She denies any change in symptoms, fall since yesterday, increased swelling, pain or redness to the area. Patient has no current complaints but wants followup on his results from yesterday. Past Medical History  Diagnosis Date  . Arthritis     Rheumatoid  . Diabetes mellitus     diet controlled  . GERD (gastroesophageal reflux disease)   . Hyperlipidemia   . Hypertension     Dr. Quitman Livings [phone]  . Chronic back pain   . Neuromuscular disorder     "with nerve damage"   Past Surgical History  Procedure Laterality Date  . Lumbar spine surgery  03/2009  & 2012  . Wisdom tooth extraction      hx of  . Mass excision N/A 04/19/2012    Procedure: removal of posterior cervical lipoma;  Surgeon: Cristi Loron, MD;  Location: [facility];  Service: Neurosurgery;  Laterality: N/A;  Removal of posterior cervical lipoma   Family History  Problem Relation Age of Onset  . Heart disease Mother   . Prostate cancer Brother    History  Substance Use Topics  . Smoking status: Former Games developer  . Smokeless tobacco: Never Used  . Alcohol Use: 0.6 oz/week    1 Cans of beer per week    Review of  Systems ROS is negative unless otherwise stated in the HPI  Allergies  Other and Adhesive  Home Medications   Current Outpatient Rx  Name  Route  Sig  Dispense  Refill  . aspirin 81 MG tablet   Oral   Take 81 mg by mouth daily.         . cyclobenzaprine (FLEXERIL) 10 MG tablet   Oral   Take 10 mg by mouth 3 (three) times daily as needed. For muscle spasm         . diazepam (VALIUM) 10 MG tablet   Oral   Take 10 mg by mouth 2 (two) times daily.         . DULoxetine (CYMBALTA) 60 MG capsule   Oral   Take 60 mg by mouth daily.         . hydrochlorothiazide (HYDRODIURIL) 25 MG tablet   Oral   Take 25 mg by mouth daily.         Marland Kitchen HYDROcodone-acetaminophen (NORCO) 10-325 MG per tablet   Oral   Take 1 tablet by mouth every 6 (six) hours as needed for pain.         . meloxicam (MOBIC) 15 MG tablet   Oral   Take 15 mg by mouth daily.         . Menthol, Topical Analgesic, (BENGAY EX)   Apply externally   Apply 1  application topically daily as needed (for muscle pain).         Marland Kitchen omeprazole (PRILOSEC) 40 MG capsule   Oral   Take 40 mg by mouth daily.         . pravastatin (PRAVACHOL) 40 MG tablet   Oral   Take 40 mg by mouth daily.         . pregabalin (LYRICA) 100 MG capsule   Oral   Take 100 mg by mouth 3 (three) times daily.         Marland Kitchen topiramate (TOPAMAX) 50 MG tablet   Oral   Take 50 mg by mouth 2 (two) times daily as needed. For nerve pain         . traMADol (ULTRAM) 50 MG tablet   Oral   Take 100 mg by mouth 2 (two) times daily as needed for pain.          BP 165/109  Pulse 88  Temp(Src) 98 F (36.7 C) (Oral)  Resp 20  Wt 242 lb (109.77 kg)  BMI 33.77 kg/m2  SpO2 100% Physical Exam  Nursing note and vitals reviewed. Constitutional: He appears well-developed and well-nourished. No distress.  HENT:  Head: Normocephalic and atraumatic.  Eyes: Pupils are equal, round, and reactive to light.  Neck: Normal range of motion.  Neck supple.  Cardiovascular: Normal rate and regular rhythm.   Pulmonary/Chest: Effort normal.  Abdominal: Soft.  Musculoskeletal:       Left knee: He exhibits swelling. He exhibits normal range of motion, no ecchymosis, no deformity, no laceration and no erythema.  Neurological: He is alert.  Skin: Skin is warm and dry.    ED Course  Procedures (including critical care time) Labs Review Labs Reviewed - No data to display Imaging Review No results found.  VASCULAR LAB  PRELIMINARY PRELIMINARY PRELIMINARY PRELIMINARY  Left lower extremity venous duplex completed.  Preliminary report: Left: No evidence of DVT or superficial thrombosis. In the popliteal fossa and proximal calf there is an area of hypoechogenicity. This may represent a ruptured Baker's cyst or hematoma from injury 2 weeks ago.  CESTONE, HELENE, RVT  10/19/2012, 4:58 PM    MDM   1. Left leg swelling    Are reviewed ultrasound results from yesterday. No acute pathology that requires emergency intervention at this time. Discussed with patient to use warm moist compresses over the area and a knee sleeve. He has been referred back to his pain management doctor for further management of his pain.  57 y.o.Thomas Mcclure's evaluation in the Emergency Department is complete. It has been determined that no acute conditions requiring further emergency intervention are present at this time. The patient/guardian have been advised of the diagnosis and plan. We have discussed signs and symptoms that warrant return to the ED, such as changes or worsening in symptoms.  Vital signs are stable at discharge. Filed Vitals:   10/20/12 0925  BP: 165/109  Pulse: 88  Temp: 98 F (36.7 C)  Resp: 20    Patient/guardian has voiced understanding and agreed to follow-up with the PCP or specialist.    Dorthula Matas, PA-C 10/20/12 1002

## 2012-10-20 NOTE — ED Notes (Signed)
Patient states "I received my results from vascular study yesterday.  I couldn't stay in the emergency room because I was hurting.  I had to come back today to find out what the treatment is for it".

## 2012-10-20 NOTE — ED Notes (Signed)
Pt in after having left lower leg doppler study yesterday that was negative, told by primary MD to come in for follow up based on results.

## 2012-10-20 NOTE — ED Provider Notes (Signed)
Medical screening examination/treatment/procedure(s) were performed by non-physician practitioner and as supervising physician I was immediately available for consultation/collaboration.   Eris Breck S Rudolf Blizard, MD 10/20/12 1935 

## 2012-10-26 ENCOUNTER — Ambulatory Visit: Payer: Medicare Other | Admitting: Occupational Therapy

## 2012-10-28 ENCOUNTER — Ambulatory Visit: Payer: Medicare Other | Attending: Orthopedic Surgery | Admitting: Occupational Therapy

## 2012-10-28 DIAGNOSIS — M545 Low back pain, unspecified: Secondary | ICD-10-CM | POA: Insufficient documentation

## 2012-10-28 DIAGNOSIS — R5381 Other malaise: Secondary | ICD-10-CM | POA: Insufficient documentation

## 2012-10-28 DIAGNOSIS — R262 Difficulty in walking, not elsewhere classified: Secondary | ICD-10-CM | POA: Insufficient documentation

## 2012-10-28 DIAGNOSIS — IMO0001 Reserved for inherently not codable concepts without codable children: Secondary | ICD-10-CM | POA: Insufficient documentation

## 2012-11-09 ENCOUNTER — Ambulatory Visit: Payer: Medicare Other | Admitting: Occupational Therapy

## 2012-11-16 ENCOUNTER — Encounter: Payer: Medicare Other | Admitting: Occupational Therapy

## 2012-11-23 ENCOUNTER — Encounter: Payer: Medicare Other | Admitting: Occupational Therapy

## 2012-11-30 ENCOUNTER — Ambulatory Visit: Payer: Medicare Other | Admitting: Occupational Therapy

## 2012-12-11 ENCOUNTER — Emergency Department (HOSPITAL_COMMUNITY)
Admission: EM | Admit: 2012-12-11 | Discharge: 2012-12-11 | Disposition: A | Discharge status: Home / Self Care | Payer: Medicare Other | Attending: Emergency Medicine | Admitting: Emergency Medicine

## 2012-12-11 ENCOUNTER — Emergency Department (HOSPITAL_COMMUNITY): Payer: Medicare Other

## 2012-12-11 ENCOUNTER — Encounter (HOSPITAL_COMMUNITY): Payer: Self-pay | Admitting: Emergency Medicine

## 2012-12-11 DIAGNOSIS — I10 Essential (primary) hypertension: Secondary | ICD-10-CM | POA: Insufficient documentation

## 2012-12-11 DIAGNOSIS — G8929 Other chronic pain: Secondary | ICD-10-CM | POA: Insufficient documentation

## 2012-12-11 DIAGNOSIS — M069 Rheumatoid arthritis, unspecified: Secondary | ICD-10-CM | POA: Insufficient documentation

## 2012-12-11 DIAGNOSIS — M25473 Effusion, unspecified ankle: Secondary | ICD-10-CM | POA: Insufficient documentation

## 2012-12-11 DIAGNOSIS — K219 Gastro-esophageal reflux disease without esophagitis: Secondary | ICD-10-CM | POA: Insufficient documentation

## 2012-12-11 DIAGNOSIS — E785 Hyperlipidemia, unspecified: Secondary | ICD-10-CM | POA: Insufficient documentation

## 2012-12-11 DIAGNOSIS — E119 Type 2 diabetes mellitus without complications: Secondary | ICD-10-CM | POA: Insufficient documentation

## 2012-12-11 DIAGNOSIS — M25476 Effusion, unspecified foot: Secondary | ICD-10-CM | POA: Insufficient documentation

## 2012-12-11 DIAGNOSIS — M7989 Other specified soft tissue disorders: Secondary | ICD-10-CM | POA: Insufficient documentation

## 2012-12-11 DIAGNOSIS — Z791 Long term (current) use of non-steroidal anti-inflammatories (NSAID): Secondary | ICD-10-CM | POA: Insufficient documentation

## 2012-12-11 DIAGNOSIS — Z87891 Personal history of nicotine dependence: Secondary | ICD-10-CM | POA: Insufficient documentation

## 2012-12-11 DIAGNOSIS — Z7982 Long term (current) use of aspirin: Secondary | ICD-10-CM | POA: Insufficient documentation

## 2012-12-11 DIAGNOSIS — M7120 Synovial cyst of popliteal space [Baker], unspecified knee: Secondary | ICD-10-CM | POA: Insufficient documentation

## 2012-12-11 DIAGNOSIS — M79609 Pain in unspecified limb: Secondary | ICD-10-CM

## 2012-12-11 DIAGNOSIS — Z79899 Other long term (current) drug therapy: Secondary | ICD-10-CM | POA: Insufficient documentation

## 2012-12-11 DIAGNOSIS — M7122 Synovial cyst of popliteal space [Baker], left knee: Secondary | ICD-10-CM

## 2012-12-11 DIAGNOSIS — G709 Myoneural disorder, unspecified: Secondary | ICD-10-CM | POA: Insufficient documentation

## 2012-12-11 LAB — POCT I-STAT, CHEM 8
BUN: 13 mg/dL (ref 6–23)
Calcium, Ion: 1.16 mmol/L (ref 1.12–1.23)
Chloride: 103 mEq/L (ref 96–112)
Creatinine, Ser: 1.2 mg/dL (ref 0.50–1.35)
Glucose, Bld: 134 mg/dL — ABNORMAL HIGH (ref 70–99)
HCT: 45 % (ref 39.0–52.0)
Hemoglobin: 15.3 g/dL (ref 13.0–17.0)
Potassium: 3.2 mEq/L — ABNORMAL LOW (ref 3.5–5.1)
Sodium: 142 mEq/L (ref 135–145)
TCO2: 24 mmol/L (ref 0–100)

## 2012-12-11 MED ORDER — HYDROCODONE-ACETAMINOPHEN 5-325 MG PO TABS: 2.0000 | ORAL_TABLET | Freq: Four times a day (QID) | ORAL | Status: DC | PRN

## 2012-12-11 MED ORDER — IOHEXOL 300 MG/ML  SOLN
80.0000 mL | Freq: Once | INTRAMUSCULAR | Status: AC | PRN
  Administered 2012-12-11: 80 mL via INTRAVENOUS

## 2012-12-11 MED ORDER — ONDANSETRON 4 MG PO TBDP
8.0000 mg | ORAL_TABLET | Freq: Once | ORAL | Status: AC
  Administered 2012-12-11: 8 mg via ORAL
  Filled 2012-12-11: qty 2

## 2012-12-11 MED ORDER — ONDANSETRON HCL 4 MG PO TABS: 4.0000 mg | ORAL_TABLET | Freq: Four times a day (QID) | ORAL | Status: DC

## 2012-12-11 MED ORDER — HYDROCODONE-ACETAMINOPHEN 5-325 MG PO TABS
2.0000 | ORAL_TABLET | Freq: Once | ORAL | Status: AC
  Administered 2012-12-11: 2 via ORAL
  Filled 2012-12-11: qty 2

## 2012-12-11 NOTE — ED Notes (Signed)
Doppler completed.

## 2012-12-11 NOTE — ED Provider Notes (Signed)
CSN: [id number]     Arrival date & time 12/11/12  0918 History  This chart was scribed for non-physician practitioner, Junious Silk, PA-C working with Doug Sou, MD by Greggory Stallion, ED scribe. This patient was seen in room TR09C/TR09C and the patient's care was started at 9:54 AM.   Chief Complaint  Patient presents with  . Leg Pain    Left leg   The history is provided by the patient. No language interpreter was used.   HPI Comments: Thomas Mcclure is a 57 y.o. male who presents to the Emergency Department complaining of gradual onset, constant sharp left calf pain and swelling that started 5-6 weeks ago. Pt had an original injury and thought that's what caused the pain. He states he was seen then for the same and an ultrasound was done. DVT was ruled out and he was told he had a Baker's cyst. Pt has noticed swelling to his left ankle in the last week. Pt has taken tramadol today with no relief. Elevation relieves some of the pain. He denies trouble breathing, SOB, fever, chills, abdominal pain, nausea and emesis. Pt denies recent surgery or long trips. He denies history of DVT. He is not on any sort of anticoagulation.   Past Medical History  Diagnosis Date  . Arthritis     Rheumatoid  . Diabetes mellitus     diet controlled  . GERD (gastroesophageal reflux disease)   . Hyperlipidemia   . Hypertension     Dr. Quitman Livings [phone]  . Chronic back pain   . Neuromuscular disorder     "with nerve damage"   Past Surgical History  Procedure Laterality Date  . Lumbar spine surgery  03/2009  & 2012  . Wisdom tooth extraction      hx of  . Mass excision N/A 04/19/2012    Procedure: removal of posterior cervical lipoma;  Surgeon: Cristi Loron, MD;  Location: [facility];  Service: Neurosurgery;  Laterality: N/A;  Removal of posterior cervical lipoma   Family History  Problem Relation Age of Onset  . Heart disease Mother   . Prostate cancer Brother    History   Substance Use Topics  . Smoking status: Former Games developer  . Smokeless tobacco: Never Used  . Alcohol Use: 0.6 oz/week    1 Cans of beer per week    Review of Systems  Constitutional: Negative for fever and chills.  Respiratory: Negative for shortness of breath.   Gastrointestinal: Negative for nausea, vomiting and abdominal pain.  Musculoskeletal: Positive for joint swelling and myalgias.  All other systems reviewed and are negative.    Allergies  Other and Adhesive  Home Medications   Current Outpatient Rx  Name  Route  Sig  Dispense  Refill  . aspirin 81 MG tablet   Oral   Take 81 mg by mouth daily.         . cyclobenzaprine (FLEXERIL) 10 MG tablet   Oral   Take 10 mg by mouth 3 (three) times daily as needed. For muscle spasm         . diazepam (VALIUM) 10 MG tablet   Oral   Take 10 mg by mouth 2 (two) times daily.         . DULoxetine (CYMBALTA) 60 MG capsule   Oral   Take 60 mg by mouth daily.         . hydrochlorothiazide (HYDRODIURIL) 25 MG tablet   Oral   Take 25  mg by mouth daily.         Marland Kitchen HYDROcodone-acetaminophen (NORCO) 10-325 MG per tablet   Oral   Take 1 tablet by mouth every 6 (six) hours as needed for pain.         . meloxicam (MOBIC) 15 MG tablet   Oral   Take 15 mg by mouth daily.         . Menthol, Topical Analgesic, (BENGAY EX)   Apply externally   Apply 1 application topically daily as needed (for muscle pain).         Marland Kitchen omeprazole (PRILOSEC) 40 MG capsule   Oral   Take 40 mg by mouth daily.         . pravastatin (PRAVACHOL) 40 MG tablet   Oral   Take 40 mg by mouth daily.         . pregabalin (LYRICA) 100 MG capsule   Oral   Take 100 mg by mouth 3 (three) times daily.         Marland Kitchen topiramate (TOPAMAX) 50 MG tablet   Oral   Take 50 mg by mouth 2 (two) times daily as needed. For nerve pain         . traMADol (ULTRAM) 50 MG tablet   Oral   Take 100 mg by mouth 2 (two) times daily as needed for pain.           BP 149/113  Pulse 113  Temp(Src) 98.4 F (36.9 C) (Oral)  Resp 16  Ht 5\' 10"  (1.778 m)  Wt 245 lb (111.131 kg)  BMI 35.15 kg/m2  SpO2 96%  Physical Exam  Nursing note and vitals reviewed. Constitutional: He is oriented to person, place, and time. He appears well-developed and well-nourished. No distress.  HENT:  Head: Normocephalic and atraumatic.  Right Ear: External ear normal.  Left Ear: External ear normal.  Nose: Nose normal.  Eyes: Conjunctivae are normal.  Neck: Normal range of motion. No tracheal deviation present.  Cardiovascular: Normal rate, regular rhythm, normal heart sounds, intact distal pulses and normal pulses.   Pulmonary/Chest: Effort normal and breath sounds normal. No stridor.  Abdominal: Soft. He exhibits no distension. There is no tenderness.  Musculoskeletal: Normal range of motion.  Tender to palpation over left calf. No erythema.   Neurological: He is alert and oriented to person, place, and time.  Neurovascularly intact. Compartments soft.   Skin: Skin is warm and dry. He is not diaphoretic.  Psychiatric: He has a normal mood and affect. His behavior is normal.    ED Course  Procedures (including critical care time)  DIAGNOSTIC STUDIES: Oxygen Saturation is 96% on RA, normal by my interpretation.    COORDINATION OF CARE: 9:58 AM-Discussed treatment plan which includes ultrasound with pt at bedside and pt agreed to plan.   Labs Review Labs Reviewed  POCT I-STAT, CHEM 8 - Abnormal; Notable for the following:    Potassium 3.2 (*)    Glucose, Bld 134 (*)    All other components within normal limits   Imaging Review Ct Tibia Fibula Left W Contrast  12/11/2012   CLINICAL DATA:  Right calf pain.  EXAM: CT OF THE LEFT TIBIA AND FIBULA WITH CONTRAST  TECHNIQUE: Standard CT imaging of the left lower extremity was performed with coronal and sagittal reformatted images obtained.  COMPARISON:  Radiographs 07/31/2008.  FINDINGS: There is an  elongated complex Baker's cyst deep to the muscle fascia and compressing the medial gastroc muscle. High attenuation  material could be blood, enhancing synovial elements or other debris.  The knee joint appears normal. The tibia and fibula are normal.  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 This is likely complicated by either hemorrhage, debris or enhancing synovium  No acute bony findings.   Electronically Signed   By: Loralie Champagne M.D.   On: 12/11/2012 12:57    EKG Interpretation   None      1:25 PM Discussed case with Dr. Lequita Halt. Nothing acutely needs to be done. Patient to continue to rest, heat, elevate. Dr. Lequita Halt will see him in the office.   MDM   1. Baker's cyst, left    Patient presents with left calf pain. CT with contrast done after [place] of leg is unchanged from 6 weeks ago. CT shows elongated dissecting Baker's cyst deep to the fascia with mass effect on the medial gastroc muscle. I discussed this case with Dr. Lequita Halt who recommends rest, elevation, and heat. He will follow up with this patient on an outpatient basis. Neurovascularly intact. Compartment soft. Return instructions given. Vital signs stable for discharge. Discussed case with Dr. Ethelda Chick who agrees with plan. Patient / Family / Caregiver informed of clinical course, understand medical decision-making process, and agree with plan.   I personally performed the services described in this documentation, which was scribed in my presence. The recorded information has been reviewed and is accurate.    Mora Bellman, PA-C 12/11/12 1415

## 2012-12-11 NOTE — ED Provider Notes (Signed)
Medical screening examination/treatment/procedure(s) were performed by non-physician practitioner and as supervising physician I was immediately available for consultation/collaboration.  Doug Sou, MD 12/11/12 1540

## 2012-12-11 NOTE — ED Notes (Signed)
Restraint documentation done in error. Pt is not in restraints.

## 2012-12-11 NOTE — ED Notes (Signed)
Pt c/o left calf pain and swelling ongoing since August. Pt seen here is August for same. Pt reports for past week noticed swelling to left ankle, denies injury. Pt took tramadol, diazepam and flexeril at 0700 without relief.

## 2012-12-11 NOTE — ED Notes (Signed)
Doppler in process.

## 2012-12-11 NOTE — Progress Notes (Signed)
VASCULAR LAB PRELIMINARY  PRELIMINARY  PRELIMINARY  PRELIMINARY  Left lower extremity venous Doppler completed.    Preliminary report:  There is no DVT or SVT noted in the left lower extremity. There is a small Baker's Cyst noted in the left popliteal fossa.  There appears to be a large muscle tear from proximal to mid calf.  Lorenso Quirino, RVT 12/11/2012, 10:30 AM

## 2012-12-20 ENCOUNTER — Other Ambulatory Visit (HOSPITAL_COMMUNITY): Payer: Self-pay | Admitting: Orthopedic Surgery

## 2012-12-20 ENCOUNTER — Ambulatory Visit (HOSPITAL_COMMUNITY)
Admission: RE | Admit: 2012-12-20 | Discharge: 2012-12-20 | Disposition: A | Discharge status: Home / Self Care | Payer: Medicare Other | Source: Ambulatory Visit | Attending: Internal Medicine | Admitting: Internal Medicine

## 2012-12-20 DIAGNOSIS — M7989 Other specified soft tissue disorders: Secondary | ICD-10-CM | POA: Insufficient documentation

## 2012-12-20 DIAGNOSIS — M79609 Pain in unspecified limb: Secondary | ICD-10-CM

## 2012-12-20 DIAGNOSIS — R229 Localized swelling, mass and lump, unspecified: Secondary | ICD-10-CM | POA: Insufficient documentation

## 2012-12-20 NOTE — Progress Notes (Signed)
Left Lower Ext. Venous Completed. Negative for DVT. There is a large cystic mass with debris noted in the proximal medial calf extending into the mid calf measuring approximately 8.8 x 4.6 x 2.0 cm.  Marilynne Halsted, BS, RDMS, RVT

## 2012-12-30 ENCOUNTER — Encounter (HOSPITAL_COMMUNITY): Payer: Self-pay | Admitting: Pharmacy Technician

## 2012-12-31 ENCOUNTER — Other Ambulatory Visit: Payer: Self-pay | Admitting: Anesthesiology

## 2013-01-04 ENCOUNTER — Encounter (HOSPITAL_COMMUNITY)
Admission: RE | Admit: 2013-01-04 | Discharge: 2013-01-04 | Disposition: A | Discharge status: Home / Self Care | Payer: Medicare Other | Source: Ambulatory Visit | Attending: Anesthesiology | Admitting: Anesthesiology

## 2013-01-04 ENCOUNTER — Encounter (HOSPITAL_COMMUNITY): Payer: Self-pay

## 2013-01-04 LAB — CBC WITH DIFFERENTIAL/PLATELET
Basophils Absolute: 0 10*3/uL (ref 0.0–0.1)
Basophils Relative: 0 % (ref 0–1)
Eosinophils Absolute: 0.4 10*3/uL (ref 0.0–0.7)
Eosinophils Relative: 4 % (ref 0–5)
HCT: 39.7 % (ref 39.0–52.0)
Hemoglobin: 13.9 g/dL (ref 13.0–17.0)
Lymphocytes Relative: 35 % (ref 12–46)
Lymphs Abs: 3.5 10*3/uL (ref 0.7–4.0)
MCH: 27.3 pg (ref 26.0–34.0)
MCHC: 35 g/dL (ref 30.0–36.0)
MCV: 78 fL (ref 78.0–100.0)
Monocytes Absolute: 0.6 10*3/uL (ref 0.1–1.0)
Monocytes Relative: 6 % (ref 3–12)
Neutro Abs: 5.4 10*3/uL (ref 1.7–7.7)
Neutrophils Relative %: 54 % (ref 43–77)
Platelets: 234 10*3/uL (ref 150–400)
RBC: 5.09 MIL/uL (ref 4.22–5.81)
RDW: 16.2 % — ABNORMAL HIGH (ref 11.5–15.5)
WBC: 9.9 10*3/uL (ref 4.0–10.5)

## 2013-01-04 LAB — BASIC METABOLIC PANEL
BUN: 14 mg/dL (ref 6–23)
CO2: 25 mEq/L (ref 19–32)
Calcium: 9.7 mg/dL (ref 8.4–10.5)
Chloride: 100 mEq/L (ref 96–112)
Creatinine, Ser: 1.02 mg/dL (ref 0.50–1.35)
GFR calc Af Amer: >90 mL/min (ref >90)
GFR calc non Af Amer: 80 mL/min — ABNORMAL LOW (ref >90)
Glucose, Bld: 100 mg/dL — ABNORMAL HIGH (ref 70–99)
Potassium: 3.5 mEq/L (ref 3.5–5.1)
Sodium: 138 mEq/L (ref 135–145)

## 2013-01-04 LAB — PROTIME-INR
INR: 0.89 (ref 0.00–1.49)
Prothrombin Time: 11.9 seconds (ref 11.6–15.2)

## 2013-01-04 LAB — SURGICAL PCR SCREEN
MRSA, PCR: NEGATIVE
Staphylococcus aureus: NEGATIVE

## 2013-01-04 LAB — APTT: aPTT: 29 seconds (ref 24–37)

## 2013-01-04 NOTE — Pre-Procedure Instructions (Addendum)
Thomas Mcclure  01/04/2013   Your procedure is scheduled on:  01/07/13  Report to [facility] short stay admitting at 750 AM.  Call this number if you have problems the morning of surgery: [phone]   Remember:   Do not eat food or drink liquids after midnight.   Take these medicines the morning of surgery with A SIP OF WATER: cymbalta,valium, omeprazole, pain med if needed      STOP all herbel meds, nsaids (aleve,naproxen,advil,ibuprofen) 5 days prior to surgery including MELOXICAM< ASPIRIN now   Do not wear jewelry, make-up or nail polish.  Do not wear lotions, powders, or perfumes. You may wear deodorant.  Do not shave 48 hours prior to surgery. Men may shave face and neck.  Do not bring valuables to the hospital.  [facility] is not responsible                  for any belongings or valuables.               Contacts, dentures or bridgework may not be worn into surgery.  Leave suitcase in the car. After surgery it may be brought to your room.  For patients admitted to the hospital, discharge time is determined by your                treatment team.               Patients discharged the day of surgery will not be allowed to drive  home.  Name and phone number of your driver:   Special Instructions: Shower using CHG 2 nights before surgery and the night before surgery.  If you shower the day of surgery use CHG.  Use special wash - you have one bottle of CHG for all showers.  You should use approximately 1/3 of the bottle for each shower.   Please read over the following fact sheets that you were given: Pain Booklet, Coughing and Deep Breathing, MRSA Information and Surgical Site Infection Prevention

## 2013-01-04 NOTE — Progress Notes (Signed)
01/04/13 1330  OBSTRUCTIVE SLEEP APNEA  Have you ever been diagnosed with sleep apnea through a sleep study? No  Do you snore loudly (loud enough to be heard through closed doors)?  0  Do you often feel tired, fatigued, or sleepy during the daytime? 0  Has anyone observed you stop breathing during your sleep? 0  Do you have, or are you being treated for high blood pressure? 1  BMI more than 35 kg/m2? 1  Age over 57 years old? 1  Neck circumference greater than 40 cm/18 inches? 0 (17)  Gender: 1  Obstructive Sleep Apnea Score 4  Score 4 or greater  Results sent to PCP

## 2013-01-06 MED ORDER — CEFAZOLIN SODIUM-DEXTROSE 2-3 GM-% IV SOLR
2.0000 g | INTRAVENOUS | Status: DC
  Filled 2013-01-06: qty 50

## 2013-01-06 NOTE — H&P (Signed)
Thomas Mcclure is an 57 y.o. male.   Chief Complaint:back pain with radiation into the legs.   HPI: 71 year old child with a history of low back pain diagnosis spinal stenosis, now approximately 3 years status post multilevel decompression and fusion by my partner Dr. Lovell Sheehan.  Patient has continued to have low back pain radiating symptoms into his legs.  He's been under the care of Dr. Nilsa Nutting for his chronic pain management.  Patient has trialed numerous interventions, as on overriding of medications including muscle relaxants, neuromodulatory agents (Cymbalta and Lyrica) NSAIDs, and narcotic analgesics, with poor control of his symptoms.  Earlier this month he underwent a trial of spinal cord stimulator therapy using Administrator, arts trial leads.  The patient had better than 75% improvement in his overall pain and, in dorsally able to stand a lot better, and increased his level of activity.  He been referred to me by Dr. Nilsa Nutting for consideration of permanent implantation.  On presentation today the patient describes pain across the lower part of his lumbar spine and its dull, achy, pressure-like, with shooting, lateral, or knifelike features down to his legs.  He continues to use a medication regimen even though it  helped him only somewhat.  His pain has gradually worsened with any kind of activity, including standing for more than about 10 minutes.  He denies any give way weakness or loss of bowel or bladder function since the time of the surgery.  Past Medical History  Diagnosis Date  . Arthritis     Rheumatoid  . GERD (gastroesophageal reflux disease)   . Hyperlipidemia   . Hypertension     Dr. Quitman Livings [phone]  . Chronic back pain   . Neuromuscular disorder     "with nerve damage"  . Diabetes mellitus     diet controlled  . Cyst (solitary) of breast     ? cyst left calf ,knee  . Baker's cyst     Left calf    Past Surgical History  Procedure Laterality Date  .  Lumbar spine surgery  03/2009  & 2012  . Wisdom tooth extraction      hx of  . Mass excision N/A 04/19/2012    Procedure: removal of posterior cervical lipoma;  Surgeon: Cristi Loron, MD;  Location: [facility];  Service: Neurosurgery;  Laterality: N/A;  Removal of posterior cervical lipoma    Family History  Problem Relation Age of Onset  . Heart disease Mother   . Prostate cancer Brother    Social History:  reports that he has quit smoking. His smoking use included Cigarettes. He smoked 0.00 packs per day for 15 years. He has never used smokeless tobacco. He reports that he drinks about 0.6 ounces of alcohol per week. He reports that he does not use illicit drugs.  Allergies:  Allergies  Allergen Reactions  . Toradol [Ketorolac Tromethamine] Anaphylaxis    bp dropped, diaphoresis  . Other     Allergic reaction to steroid? Unsure of name  . Adhesive [Tape] Rash and Other (See Comments)    "blisters"    Medications Prior to Admission  Medication Sig Dispense Refill  . aspirin 81 MG tablet Take 81 mg by mouth daily.      . cyclobenzaprine (FLEXERIL) 10 MG tablet Take 10 mg by mouth 2 (two) times daily. For muscle spasm      . diazepam (VALIUM) 10 MG tablet Take 10 mg by mouth 2 (two) times daily.      Marland Kitchen  DULoxetine (CYMBALTA) 60 MG capsule Take 60 mg by mouth daily.      . hydrochlorothiazide (HYDRODIURIL) 25 MG tablet Take 25 mg by mouth daily.      Marland Kitchen lisinopril (PRINIVIL,ZESTRIL) 20 MG tablet Take 20 mg by mouth daily.      . meloxicam (MOBIC) 15 MG tablet Take 15 mg by mouth daily.      Marland Kitchen omeprazole (PRILOSEC) 40 MG capsule Take 40 mg by mouth daily.      . Oxycodone HCl 10 MG TABS Take 10 mg by mouth 4 (four) times daily.      . potassium chloride SA (K-DUR,KLOR-CON) 20 MEQ tablet Take 40 mEq by mouth daily.      . pravastatin (PRAVACHOL) 40 MG tablet Take 40 mg by mouth daily.      . pregabalin (LYRICA) 100 MG capsule Take 200 mg by mouth daily.       Marland Kitchen topiramate  (TOPAMAX) 50 MG tablet Take 50 mg by mouth 2 (two) times daily. For nerve pain      . traMADol (ULTRAM) 50 MG tablet Take 100 mg by mouth 3 (three) times daily as needed.         Results for orders placed during the hospital encounter of 01/07/13 (from the past 48 hour(s))  GLUCOSE, CAPILLARY     Status: Abnormal   Collection Time    01/07/13  8:30 AM      Result Value Range   Glucose-Capillary 116 (*) 70 - 99 mg/dL   No results found.  Review of Systems  Constitutional: Negative.   HENT: Negative.   Eyes: Negative.   Respiratory: Negative.   Cardiovascular: Negative.   Gastrointestinal: Negative.   Genitourinary: Negative.   Musculoskeletal: Negative.   Skin: Negative.   Neurological: Negative.   Endo/Heme/Allergies: Negative.   Psychiatric/Behavioral: Negative.     Blood pressure 147/103, pulse 89, temperature 97.4 F (36.3 C), temperature source Oral, resp. rate 20, SpO2 98.00%. Physical Exam  Constitutional: He is oriented to person, place, and time. He appears well-developed and well-nourished.  HENT:  Head: Normocephalic and atraumatic.  Eyes: EOM are normal. Pupils are equal, round, and reactive to light.  Cardiovascular: Normal rate and regular rhythm.   Respiratory: Effort normal and breath sounds normal.  Neurological: He is alert and oriented to person, place, and time.  Skin: Skin is warm and dry.  Psychiatric: He has a normal mood and affect. His behavior is normal. Thought content normal.     Assessment/Plan ASSESSMENT: 1) lumbago 2) lumbar radiculopathy 3) chronic pain 4) lumbar post-laminectomy syndrome  PLAN: Permanent SCS  Jahnessa Vanduyn C 01/07/2013, 9:48 AM

## 2013-01-07 ENCOUNTER — Ambulatory Visit (HOSPITAL_COMMUNITY): Payer: Medicare Other

## 2013-01-07 ENCOUNTER — Encounter (HOSPITAL_COMMUNITY): Payer: Self-pay | Admitting: Anesthesiology

## 2013-01-07 ENCOUNTER — Encounter (HOSPITAL_COMMUNITY)
Admission: RE | Disposition: A | Discharge status: Home / Self Care | Payer: Self-pay | Source: Ambulatory Visit | Attending: Anesthesiology

## 2013-01-07 ENCOUNTER — Ambulatory Visit (HOSPITAL_COMMUNITY): Payer: Medicare Other | Admitting: Anesthesiology

## 2013-01-07 ENCOUNTER — Ambulatory Visit (HOSPITAL_COMMUNITY)
Admission: RE | Admit: 2013-01-07 | Discharge: 2013-01-07 | Disposition: A | Discharge status: Home / Self Care | Payer: Medicare Other | Source: Ambulatory Visit | Attending: Anesthesiology | Admitting: Anesthesiology

## 2013-01-07 ENCOUNTER — Encounter (HOSPITAL_COMMUNITY): Payer: Medicare Other | Admitting: Anesthesiology

## 2013-01-07 DIAGNOSIS — M069 Rheumatoid arthritis, unspecified: Secondary | ICD-10-CM | POA: Insufficient documentation

## 2013-01-07 DIAGNOSIS — M961 Postlaminectomy syndrome, not elsewhere classified: Secondary | ICD-10-CM | POA: Insufficient documentation

## 2013-01-07 DIAGNOSIS — G8929 Other chronic pain: Secondary | ICD-10-CM | POA: Insufficient documentation

## 2013-01-07 DIAGNOSIS — Z981 Arthrodesis status: Secondary | ICD-10-CM | POA: Insufficient documentation

## 2013-01-07 DIAGNOSIS — Z87891 Personal history of nicotine dependence: Secondary | ICD-10-CM | POA: Insufficient documentation

## 2013-01-07 DIAGNOSIS — K219 Gastro-esophageal reflux disease without esophagitis: Secondary | ICD-10-CM | POA: Insufficient documentation

## 2013-01-07 DIAGNOSIS — E785 Hyperlipidemia, unspecified: Secondary | ICD-10-CM | POA: Insufficient documentation

## 2013-01-07 DIAGNOSIS — Z01812 Encounter for preprocedural laboratory examination: Secondary | ICD-10-CM | POA: Insufficient documentation

## 2013-01-07 DIAGNOSIS — I10 Essential (primary) hypertension: Secondary | ICD-10-CM | POA: Insufficient documentation

## 2013-01-07 DIAGNOSIS — Z7982 Long term (current) use of aspirin: Secondary | ICD-10-CM | POA: Insufficient documentation

## 2013-01-07 DIAGNOSIS — E119 Type 2 diabetes mellitus without complications: Secondary | ICD-10-CM | POA: Insufficient documentation

## 2013-01-07 HISTORY — DX: Synovial cyst of popliteal space (Baker), unspecified knee: M71.20

## 2013-01-07 HISTORY — PX: SPINAL CORD STIMULATOR INSERTION: SHX5378

## 2013-01-07 LAB — GLUCOSE, CAPILLARY: Glucose-Capillary: 116 mg/dL — ABNORMAL HIGH (ref 70–99)

## 2013-01-07 SURGERY — CERVICAL SPINAL CORD STIMULATOR INSERTION
Anesthesia: Monitor Anesthesia Care | Site: Back | Wound class: Clean

## 2013-01-07 MED ORDER — 0.9 % SODIUM CHLORIDE (POUR BTL) OPTIME
TOPICAL | Status: DC | PRN
  Administered 2013-01-07: 1000 mL

## 2013-01-07 MED ORDER — LACTATED RINGERS IV SOLN
INTRAVENOUS | Status: DC
  Administered 2013-01-07: 09:00:00 via INTRAVENOUS

## 2013-01-07 MED ORDER — CEPHALEXIN 500 MG PO CAPS: 500.0000 mg | ORAL_CAPSULE | Freq: Four times a day (QID) | ORAL | Status: DC

## 2013-01-07 MED ORDER — CEFAZOLIN SODIUM-DEXTROSE 2-3 GM-% IV SOLR
INTRAVENOUS | Status: AC
  Administered 2013-01-07: 2 g via INTRAVENOUS
  Filled 2013-01-07: qty 50

## 2013-01-07 MED ORDER — BACITRACIN ZINC 500 UNIT/GM EX OINT
TOPICAL_OINTMENT | CUTANEOUS | Status: DC | PRN
  Administered 2013-01-07: 1 via TOPICAL

## 2013-01-07 MED ORDER — BUPIVACAINE-EPINEPHRINE PF 0.5-1:200000 % IJ SOLN
INTRAMUSCULAR | Status: DC | PRN
  Administered 2013-01-07: 8 mL
  Administered 2013-01-07: 7 mL

## 2013-01-07 MED ORDER — HYDROMORPHONE HCL PF 1 MG/ML IJ SOLN
0.2500 mg | INTRAMUSCULAR | Status: DC | PRN
  Administered 2013-01-07 (×4): 0.5 mg via INTRAVENOUS

## 2013-01-07 MED ORDER — SODIUM CHLORIDE 0.9 % IR SOLN
Status: DC | PRN
  Administered 2013-01-07: 11:00:00

## 2013-01-07 MED ORDER — OXYCODONE HCL 5 MG PO TABS: 5.0000 mg | ORAL_TABLET | Freq: Once | ORAL | Status: DC | PRN

## 2013-01-07 MED ORDER — PROPOFOL INFUSION 10 MG/ML OPTIME
INTRAVENOUS | Status: DC | PRN
  Administered 2013-01-07: 100 ug/kg/min via INTRAVENOUS

## 2013-01-07 MED ORDER — OXYCODONE HCL 10 MG PO TABS: 10.0000 mg | ORAL_TABLET | ORAL | Status: DC

## 2013-01-07 MED ORDER — LACTATED RINGERS IV SOLN
INTRAVENOUS | Status: DC | PRN
  Administered 2013-01-07: 10:00:00 via INTRAVENOUS

## 2013-01-07 MED ORDER — PROPOFOL 10 MG/ML IV BOLUS
INTRAVENOUS | Status: DC | PRN
  Administered 2013-01-07: 10 mg via INTRAVENOUS
  Administered 2013-01-07 (×2): 20 mg via INTRAVENOUS

## 2013-01-07 MED ORDER — OXYCODONE HCL 5 MG/5ML PO SOLN: 5.0000 mg | Freq: Once | ORAL | Status: DC | PRN

## 2013-01-07 MED ORDER — METOCLOPRAMIDE HCL 5 MG/ML IJ SOLN: 10.0000 mg | Freq: Once | INTRAMUSCULAR | Status: DC | PRN

## 2013-01-07 MED ORDER — HYDROMORPHONE HCL PF 1 MG/ML IJ SOLN
INTRAMUSCULAR | Status: AC
  Filled 2013-01-07: qty 1

## 2013-01-07 SURGICAL SUPPLY — 70 items
ADH SKN CLS APL DERMABOND .7 (GAUZE/BANDAGES/DRESSINGS)
ADHESIVE MEDICAL (Stimulator) ×1 IMPLANT
ANCH LD 4 SET SPNL CORD STM (Anchor) ×1 IMPLANT
ANCHOR CLICK (Anchor) ×1 IMPLANT
ANCHOR CLIK NEURO F/LEAD 2 (Anchor) IMPLANT
BAG DECANTER FOR FLEXI CONT (MISCELLANEOUS) ×2 IMPLANT
BLADE SURG ROTATE 9660 (MISCELLANEOUS) IMPLANT
BRUSH SCRUB EZ PLAIN DRY (MISCELLANEOUS) ×1 IMPLANT
BUR MATCHSTICK NEURO 3.0 LAGG (BURR) IMPLANT
BUR PRECISION FLUTE 5.0 (BURR) IMPLANT
CABLE/EXTENSION OR 1X16 61 (CABLE) ×2 IMPLANT
CHLORAPREP W/TINT 26ML (MISCELLANEOUS) ×2 IMPLANT
CONT SPEC 4OZ CLIKSEAL STRL BL (MISCELLANEOUS) ×2 IMPLANT
DERMABOND ADVANCED (GAUZE/BANDAGES/DRESSINGS)
DERMABOND ADVANCED .7 DNX12 (GAUZE/BANDAGES/DRESSINGS) IMPLANT
DRAPE C-ARM 42X72 X-RAY (DRAPES) ×2 IMPLANT
DRAPE C-ARMOR (DRAPES) ×1 IMPLANT
DRAPE CAMERA VIDEO/LASER (DRAPES) ×1 IMPLANT
DRAPE LAPAROTOMY 100X72X124 (DRAPES) ×2 IMPLANT
DRAPE POUCH INSTRU U-SHP 10X18 (DRAPES) ×2 IMPLANT
DRAPE SURG 17X23 STRL (DRAPES) ×2 IMPLANT
DRESSING TELFA 8X3 (GAUZE/BANDAGES/DRESSINGS) ×1 IMPLANT
DRSG COVADERM 4X8 (GAUZE/BANDAGES/DRESSINGS) ×2 IMPLANT
DRSG OPSITE POSTOP 3X4 (GAUZE/BANDAGES/DRESSINGS) ×1 IMPLANT
DRSG OPSITE POSTOP 4X6 (GAUZE/BANDAGES/DRESSINGS) ×1 IMPLANT
ELECT REM PT RETURN 9FT ADLT (ELECTROSURGICAL) ×2
ELECTRODE REM PT RTRN 9FT ADLT (ELECTROSURGICAL) ×1 IMPLANT
GAUZE SPONGE 4X4 16PLY XRAY LF (GAUZE/BANDAGES/DRESSINGS) IMPLANT
GLOVE BIOGEL PI IND STRL 7.0 (GLOVE) IMPLANT
GLOVE BIOGEL PI INDICATOR 7.0 (GLOVE) ×1
GLOVE EXAM NITRILE LRG STRL (GLOVE) IMPLANT
GLOVE EXAM NITRILE MD LF STRL (GLOVE) IMPLANT
GLOVE EXAM NITRILE XL STR (GLOVE) IMPLANT
GLOVE EXAM NITRILE XS STR PU (GLOVE) IMPLANT
GLOVE SURG SS PI 7.0 STRL IVOR (GLOVE) ×2 IMPLANT
GOWN BRE IMP SLV AUR LG STRL (GOWN DISPOSABLE) ×1 IMPLANT
GOWN BRE IMP SLV AUR XL STRL (GOWN DISPOSABLE) ×1 IMPLANT
GOWN STRL REIN 2XL LVL4 (GOWN DISPOSABLE) IMPLANT
IPG PRECISION SPECTRA (Stimulator) ×1 IMPLANT
KIT BASIN OR (CUSTOM PROCEDURE TRAY) ×2 IMPLANT
KIT CHARGING (KITS) ×1
KIT CHARGING PRECISION NEURO (KITS) IMPLANT
KIT REMOTE CONTROL PRECISION (KITS) ×1 IMPLANT
KIT ROOM TURNOVER OR (KITS) ×2 IMPLANT
KIT SPLITTER 30CM 2X8 (Stimulator) ×2 IMPLANT
LEAD KIT CONTACT INFINION 16 (Stimulator) ×2 IMPLANT
MEDICAL ADHESIVE ×1 IMPLANT
NDL 18GX1X1/2 (RX/OR ONLY) (NEEDLE) IMPLANT
NDL HYPO 25X1 1.5 SAFETY (NEEDLE) ×1 IMPLANT
NEEDLE 18GX1X1/2 (RX/OR ONLY) (NEEDLE) IMPLANT
NEEDLE HYPO 25X1 1.5 SAFETY (NEEDLE) ×2 IMPLANT
NS IRRIG 1000ML POUR BTL (IV SOLUTION) ×2 IMPLANT
PACK LAMINECTOMY NEURO (CUSTOM PROCEDURE TRAY) ×2 IMPLANT
PAD ARMBOARD 7.5X6 YLW CONV (MISCELLANEOUS) ×2 IMPLANT
SPONGE LAP 4X18 X RAY DECT (DISPOSABLE) IMPLANT
SPONGE SURGIFOAM ABS GEL SZ50 (HEMOSTASIS) ×1 IMPLANT
STAPLER SKIN PROX WIDE 3.9 (STAPLE) ×2 IMPLANT
SUT MNCRL AB 3-0 PS2 18 (SUTURE) ×2 IMPLANT
SUT MNCRL AB 4-0 PS2 18 (SUTURE) ×2 IMPLANT
SUT SILK 0 (SUTURE) ×2
SUT SILK 0 MO-6 18XCR BRD 8 (SUTURE) IMPLANT
SUT SILK 2 0 FS (SUTURE) ×12 IMPLANT
SUT SILK 2 0 TIES 10X30 (SUTURE) IMPLANT
SUT VIC AB 2-0 CP2 18 (SUTURE) ×5 IMPLANT
SYR EPIDURAL 5ML GLASS (SYRINGE) ×1 IMPLANT
SYRINGE 10CC LL (SYRINGE) IMPLANT
TOWEL OR 17X24 6PK STRL BLUE (TOWEL DISPOSABLE) ×2 IMPLANT
TOWEL OR 17X26 10 PK STRL BLUE (TOWEL DISPOSABLE) ×2 IMPLANT
WATER STERILE IRR 1000ML POUR (IV SOLUTION) ×2 IMPLANT
YANKAUER SUCT BULB TIP NO VENT (SUCTIONS) ×2 IMPLANT

## 2013-01-07 NOTE — Transfer of Care (Signed)
Immediate Anesthesia Transfer of Care Note  Patient: Thomas Mcclure  Procedure(s) Performed: Procedure(s):  SPINAL CORD STIMULATOR INSERTION (N/A)  Patient Location: PACU  Anesthesia Type:MAC  Level of Consciousness: awake and confused  Airway & Oxygen Therapy: Patient Spontanous Breathing and Patient connected to nasal cannula oxygen  Post-op Assessment: Report given to PACU RN and Post -op Vital signs reviewed and stable  Post vital signs: Reviewed and stable  Complications: No apparent anesthesia complications

## 2013-01-07 NOTE — Preoperative (Signed)
Beta Blockers   Reason not to administer Beta Blockers:Not Applicable 

## 2013-01-07 NOTE — Anesthesia Postprocedure Evaluation (Signed)
Anesthesia Post Note  Patient: Thomas Mcclure  Procedure(s) Performed: Procedure(s) (LRB):  SPINAL CORD STIMULATOR INSERTION (N/A)  Anesthesia type: General  Patient location: PACU  Post pain: Pain level controlled  Post assessment: Patient's Cardiovascular Status Stable  Last Vitals:  Filed Vitals:   01/07/13 1315  BP: 141/86  Pulse: 87  Temp:   Resp: 16    Post vital signs: Reviewed and stable  Level of consciousness: alert  Complications: No apparent anesthesia complications

## 2013-01-07 NOTE — Progress Notes (Signed)
Pt voided prior to this nurse seeing pt, unable to obtain UA during preop.

## 2013-01-07 NOTE — Anesthesia Procedure Notes (Signed)
Procedure Name: MAC Date/Time: 01/07/2013 10:09 AM Performed by: Sherie Don Pre-anesthesia Checklist: Patient identified, Emergency Drugs available, Suction available, Patient being monitored and Timeout performed Patient Re-evaluated:Patient Re-evaluated prior to inductionOxygen Delivery Method: Nasal cannula Preoxygenation: Pre-oxygenation with 100% oxygen

## 2013-01-07 NOTE — Anesthesia Preprocedure Evaluation (Signed)
Anesthesia Evaluation  Patient identified by MRN, date of birth, ID band Patient awake    Reviewed: Allergy & Precautions, H&P , NPO status , Patient's Chart, lab work & pertinent test results, reviewed documented beta blocker date and time   Airway Mallampati: II TM Distance: >3 FB Neck ROM: full    Dental   Pulmonary former smoker,  breath sounds clear to auscultation        Cardiovascular hypertension, On Medications Rhythm:regular     Neuro/Psych  Neuromuscular disease negative psych ROS   GI/Hepatic Neg liver ROS, GERD-  Medicated and Controlled,  Endo/Other  diabetes  Renal/GU negative Renal ROS  negative genitourinary   Musculoskeletal   Abdominal   Peds  Hematology negative hematology ROS (+)   Anesthesia Other Findings See surgeon's H&P   Reproductive/Obstetrics negative OB ROS                           Anesthesia Physical Anesthesia Plan  ASA: III  Anesthesia Plan: MAC   Post-op Pain Management:    Induction: Intravenous  Airway Management Planned: Simple Face Mask  Additional Equipment:   Intra-op Plan:   Post-operative Plan:   Informed Consent: I have reviewed the patients History and Physical, chart, labs and discussed the procedure including the risks, benefits and alternatives for the proposed anesthesia with the patient or authorized representative who has indicated his/her understanding and acceptance.   Dental Advisory Given  Plan Discussed with: CRNA and Surgeon  Anesthesia Plan Comments:         Anesthesia Quick Evaluation

## 2013-01-07 NOTE — Op Note (Signed)
PREOP DX: 1) lumbago  2) lumbar radiculopathy  3) lumbar post-laminectomy syndrome  4) chronic pain  POSTOP DX: 1) lumbago  2) lumbar radiculopathy  3) lumbar post-laminectomy syndrome  4) chronic pain  PROCEDURES PERFORMED:1) intraop fluoro 2) placement of 2 16 contact [employer] Infinion leads 3) placement of Spectra SCS generator  SURGEON:Miyanna Wiersma  ASSISTANT: NONE  ANESTHESIA: MAC  EBL: <20cc  DESCRIPTION OF PROCEDURE: After a discussion of risks, benefits and alternatives, informed consent was obtained. The patient was taken to the OR, turned prone onto a Jackson table, all pressure points padded, SCD's placed, and an adequate plane of anesthesia induced. A timeout was taken to verify the correct patient, position, personnel, availability of appropriate equipment, and administration of perioperative antibiotics.  The thoracic and lumbar areas were widely prepped with chloraprep and draped into a sterile field. Fluoroscopy was used to plan a left paramedian incision at the L1-L2 levels, and an incision made with a 10 blade and carried down to the dorsolumbar fascia with the bovie and blunt dissection. Retractors were placed and a 14g [employer] tuohy needle placed into the epidural space at the L1-2 interspace using biplanar fluoro and loss-of-resistance technique. The needle was aspirated without any return of fluid. A [employer] INFINION lead was introduced and under live AP fluoro advanced until the distal-most contact overlay the midportion of the T7vertebral body shadow with the rest of the contacts distributed over the T8and T9 vertebral bodies in a position just right of anatomic midline. A second Infinion lead was placed just left of anatomic midline in the same levels using the same technique. The patient was awakened and the leads tested; impedances were good, and the patient reported good coverage with amplitudes in the 3-7 mA range. 0 silk sutures were placed in the  fascia adjacent to the needles. The needles and stylets were removed under fluoroscopy with no lead migration noted. Leads were then fixed to the fascia with clik-anchors with the sutures; repeat images were obtained to verify that there had been no lead migration.  The incision was inspected and hemostasis obtained with the bipolar cautery.  Attention was then turned to creation of a subcutaneous pocket. At the left flank, a 3 cm incision was made with a 10 blade and using the bovie and blunt dissection a pocket of size appropriate to place a SCS generator. The pocket was trialed, and found to be of adequate size. The pocket was inspected for hemostasis, which was found to be excellent. Using reverse seldinger technique, the leads were tunneled to the pocket site, and the leads inserted into the SCS generator. Impedances were checked, and all found to be excellent. The leads were then all fixed into position with a self-torquing wrench. The wiring was all carefully coiled, placed behind the generator and placed in the pocket.  Both incisions were copiously irrigated with bacitracin-containing irrigation. The lumbar incision was closed in 2 deep layers of interrupted 2-0 vicryl and the skin closed with staples. The pocket incision was closed with a deeper layer of 2-0 vicryl interrupted sutures, and the skin closed with staples. Sterile dressings were applied. Needle, sponge, and instrument counts were correct x2 at the end of the case.  The patient was then carefully awakened from anesthesia, turned supine, an abdominal binder placed, and the patient taken to the recovery room where he underwent complex spinal cord stimulator programming.  COMPLICATIONS: NONE  CONDITION: Stable throughout the course of the procedure and immediately afterward  DISPOSITION: discharge to home, with antibiotics and pain medicine. Discussed care with the patient and spouse. Followup in clinic will be scheduled in 10-14 days.

## 2013-01-10 ENCOUNTER — Encounter (HOSPITAL_COMMUNITY): Payer: Self-pay | Admitting: Anesthesiology

## 2013-01-10 NOTE — Progress Notes (Signed)
Nurse in PACU was wonderful.

## 2013-01-10 NOTE — Progress Notes (Signed)
Can't use stimulator, feels like ribs are being pushed out.  Advised to see Dr. Ollen Bowl.  Needs to have it reprogrammed.

## 2013-06-21 ENCOUNTER — Emergency Department (HOSPITAL_COMMUNITY): Payer: Medicare Other

## 2013-06-21 ENCOUNTER — Encounter (HOSPITAL_COMMUNITY): Payer: Self-pay | Admitting: Emergency Medicine

## 2013-06-21 ENCOUNTER — Emergency Department (HOSPITAL_COMMUNITY)
Admission: EM | Admit: 2013-06-21 | Discharge: 2013-06-21 | Disposition: A | Discharge status: Home / Self Care | Payer: Medicare Other | Attending: Emergency Medicine | Admitting: Emergency Medicine

## 2013-06-21 DIAGNOSIS — Z792 Long term (current) use of antibiotics: Secondary | ICD-10-CM | POA: Insufficient documentation

## 2013-06-21 DIAGNOSIS — W108XXA Fall (on) (from) other stairs and steps, initial encounter: Secondary | ICD-10-CM | POA: Insufficient documentation

## 2013-06-21 DIAGNOSIS — S8990XA Unspecified injury of unspecified lower leg, initial encounter: Secondary | ICD-10-CM | POA: Insufficient documentation

## 2013-06-21 DIAGNOSIS — Z79899 Other long term (current) drug therapy: Secondary | ICD-10-CM | POA: Insufficient documentation

## 2013-06-21 DIAGNOSIS — S99919A Unspecified injury of unspecified ankle, initial encounter: Principal | ICD-10-CM

## 2013-06-21 DIAGNOSIS — S99929A Unspecified injury of unspecified foot, initial encounter: Principal | ICD-10-CM

## 2013-06-21 DIAGNOSIS — E785 Hyperlipidemia, unspecified: Secondary | ICD-10-CM | POA: Insufficient documentation

## 2013-06-21 DIAGNOSIS — Z87891 Personal history of nicotine dependence: Secondary | ICD-10-CM | POA: Insufficient documentation

## 2013-06-21 DIAGNOSIS — W010XXA Fall on same level from slipping, tripping and stumbling without subsequent striking against object, initial encounter: Secondary | ICD-10-CM | POA: Insufficient documentation

## 2013-06-21 DIAGNOSIS — M069 Rheumatoid arthritis, unspecified: Secondary | ICD-10-CM | POA: Insufficient documentation

## 2013-06-21 DIAGNOSIS — I10 Essential (primary) hypertension: Secondary | ICD-10-CM | POA: Insufficient documentation

## 2013-06-21 DIAGNOSIS — G8929 Other chronic pain: Secondary | ICD-10-CM | POA: Insufficient documentation

## 2013-06-21 DIAGNOSIS — K219 Gastro-esophageal reflux disease without esophagitis: Secondary | ICD-10-CM | POA: Insufficient documentation

## 2013-06-21 DIAGNOSIS — Y9389 Activity, other specified: Secondary | ICD-10-CM | POA: Insufficient documentation

## 2013-06-21 DIAGNOSIS — W19XXXA Unspecified fall, initial encounter: Secondary | ICD-10-CM

## 2013-06-21 DIAGNOSIS — Z7982 Long term (current) use of aspirin: Secondary | ICD-10-CM | POA: Insufficient documentation

## 2013-06-21 DIAGNOSIS — Y929 Unspecified place or not applicable: Secondary | ICD-10-CM | POA: Insufficient documentation

## 2013-06-21 DIAGNOSIS — Z791 Long term (current) use of non-steroidal anti-inflammatories (NSAID): Secondary | ICD-10-CM | POA: Insufficient documentation

## 2013-06-21 DIAGNOSIS — Z872 Personal history of diseases of the skin and subcutaneous tissue: Secondary | ICD-10-CM | POA: Insufficient documentation

## 2013-06-21 DIAGNOSIS — S8991XA Unspecified injury of right lower leg, initial encounter: Secondary | ICD-10-CM

## 2013-06-21 DIAGNOSIS — E119 Type 2 diabetes mellitus without complications: Secondary | ICD-10-CM | POA: Insufficient documentation

## 2013-06-21 NOTE — Discharge Instructions (Signed)
Rest, ice, and elevate your injury. Follow up with your doctor for further evaluation. Discharge paper given to pt.

## 2013-06-21 NOTE — ED Notes (Signed)
Fell 2 weeks ago going up stairs and hurt left leg , pain has gotten worse and his ankle isswollen no other injury

## 2013-06-21 NOTE — ED Provider Notes (Signed)
CSN: [id number]     Arrival date & time 06/21/13  1355 History  This chart was scribed for non-physician practitioner, Alvina Chou, PA-C working with Saddie Benders. Dorna Mai, MD by Frederich Balding, ED scribe. This patient was seen in room TR07C/TR07C and the patient's care was started at 3:36 PM.   Chief Complaint  Patient presents with  . Leg Pain   The history is provided by the patient. No language interpreter was used.   HPI Comments: Thomas Mcclure is a 58 y.o. male who presents to the Emergency Department complaining of a fall that occurred 2 weeks ago. Pt was trying to go up the steps, tripped and hit his left leg on concrete. He has sudden onset, worsening, sharp right knee and ankle pain. Denies other injury. Ambulation worsens the pain. Pt is currently in pain management and took some oxycodone with some relief.   Past Medical History  Diagnosis Date  . Arthritis     Rheumatoid  . GERD (gastroesophageal reflux disease)   . Hyperlipidemia   . Hypertension     Dr. [name] [phone]  . Chronic back pain   . Neuromuscular disorder     "with nerve damage"  . Diabetes mellitus     diet controlled  . Cyst (solitary) of breast     ? cyst left calf ,knee  . Baker's cyst     Left calf   Past Surgical History  Procedure Laterality Date  . Lumbar spine surgery  03/2009  & 2012  . Wisdom tooth extraction      hx of  . Mass excision N/A 04/19/2012    Procedure: removal of posterior cervical lipoma;  Surgeon: [name], MD;  Location: [place] [facility];  Service: Neurosurgery;  Laterality: N/A;  Removal of posterior cervical lipoma  . Spinal cord stimulator insertion N/A 01/07/2013    Procedure:  SPINAL CORD STIMULATOR INSERTION;  Surgeon: [name], MD;  Location: [place] [facility];  Service: Neurosurgery;  Laterality: N/A;   Family History  Problem Relation Age of Onset  . Heart disease Mother   . Prostate cancer Brother    History  Substance Use Topics  . Smoking  status: Former Smoker -- 15 years    Types: Cigarettes  . Smokeless tobacco: Never Used     Comment: 5 cig a week  . Alcohol Use: 0.6 oz/week    1 Cans of beer per week    Review of Systems  Musculoskeletal: Positive for arthralgias and myalgias.  All other systems reviewed and are negative.  Allergies  Toradol; Other; and Adhesive  Home Medications   Prior to Admission medications   Medication Sig Start Date End Date Taking? Authorizing Provider  aspirin 81 MG tablet Take 81 mg by mouth daily.    Historical Provider, MD  cephALEXin (KEFLEX) 500 MG capsule Take 1 capsule (500 mg total) by mouth 4 (four) times daily. 01/07/13   [name], MD  cyclobenzaprine (FLEXERIL) 10 MG tablet Take 10 mg by mouth 2 (two) times daily. For muscle spasm    Historical Provider, MD  diazepam (VALIUM) 10 MG tablet Take 10 mg by mouth 2 (two) times daily.    Historical Provider, MD  DULoxetine (CYMBALTA) 60 MG capsule Take 60 mg by mouth daily.    Historical Provider, MD  hydrochlorothiazide (HYDRODIURIL) 25 MG tablet Take 25 mg by mouth daily.    Historical Provider, MD  lisinopril (PRINIVIL,ZESTRIL) 20 MG tablet Take 20 mg by  mouth daily.    Historical Provider, MD  meloxicam (MOBIC) 15 MG tablet Take 15 mg by mouth daily.    Historical Provider, MD  omeprazole (PRILOSEC) 40 MG capsule Take 40 mg by mouth daily.    Historical Provider, MD  Oxycodone HCl 10 MG TABS Take 1 tablet (10 mg total) by mouth every 4 (four) hours. 01/07/13   [name], MD  potassium chloride SA (K-DUR,KLOR-CON) 20 MEQ tablet Take 40 mEq by mouth daily.    Historical Provider, MD  pravastatin (PRAVACHOL) 40 MG tablet Take 40 mg by mouth daily.    Historical Provider, MD  pregabalin (LYRICA) 100 MG capsule Take 200 mg by mouth daily.     Historical Provider, MD  topiramate (TOPAMAX) 50 MG tablet Take 50 mg by mouth 2 (two) times daily. For nerve pain    Historical Provider, MD  traMADol (ULTRAM) 50 MG tablet Take 100  mg by mouth 3 (three) times daily as needed.     Historical Provider, MD   BP 131/95  Pulse 96  Temp(Src) 97.5 F (36.4 C) (Oral)  Resp 18  SpO2 99%  Physical Exam  Nursing note and vitals reviewed. Constitutional: He is oriented to person, place, and time. He appears well-developed and well-nourished. No distress.  HENT:  Head: Normocephalic and atraumatic.  Eyes: EOM are normal.  Neck: Neck supple. No tracheal deviation present.  Cardiovascular: Normal rate.   Pulmonary/Chest: Effort normal. No respiratory distress.  Musculoskeletal: Normal range of motion.  Right knee and ankle tenderness to palpation. No obvious deformity.   Neurological: He is alert and oriented to person, place, and time.  Skin: Skin is warm and dry.  Psychiatric: He has a normal mood and affect. His behavior is normal.    ED Course  Procedures (including critical care time)  DIAGNOSTIC STUDIES: Oxygen Saturation is 99% on RA, normal by my interpretation.    COORDINATION OF CARE: 3:38 PM-Discussed treatment plan which includes ice and continuing oxycodone with pt at bedside and pt agreed to plan. Advised pt to follow up with his PCP.   Labs Review Labs Reviewed - No data to display  Imaging Review Dg Ankle Complete Right  06/21/2013   CLINICAL DATA:  Leg pain status post trauma  EXAM: RIGHT ANKLE - COMPLETE 3+ VIEW  COMPARISON:  None.  FINDINGS: There is no evidence of fracture, dislocation, or joint effusion. There is no evidence of arthropathy or other focal bone abnormality. Soft tissue swelling in the medial malleolar region.  IMPRESSION: No acute osseous abnormalities findings which may reflect soft tissue injury in the medial malleolar region.   Electronically Signed   By: Margaree Mackintosh M.D.   On: 06/21/2013 15:19   Dg Knee Complete 4 Views Right  06/21/2013   CLINICAL DATA:  Right knee pain/injury  EXAM: RIGHT KNEE - COMPLETE 4+ VIEW  COMPARISON:  None.  FINDINGS: No fracture or dislocation is  seen.  The joint spaces are preserved.  The visualized soft tissues are unremarkable.  Moderate suprapatellar knee joint effusion.  IMPRESSION: No fracture or dislocation is seen.  Moderate suprapatellar knee joint effusion.   Electronically Signed   By: Julian Hy M.D.   On: 06/21/2013 15:22     EKG Interpretation None      MDM   Final diagnoses:  Fall  Right knee injury    No fractures noted on xray.   I personally performed the services described in this documentation, which was scribed in  my presence. The recorded information has been reviewed and is accurate.  Alvina Chou, PA-C 06/23/13 2329

## 2013-06-24 NOTE — ED Provider Notes (Signed)
Medical screening examination/treatment/procedure(s) were performed by non-physician practitioner and as supervising physician I was immediately available for consultation/collaboration.   EKG Interpretation None        Thomas Mcclure. Dorna Mai, MD 06/24/13 [phone]

## 2013-08-03 ENCOUNTER — Ambulatory Visit: Payer: Medicare Other | Attending: Orthopedic Surgery | Admitting: Physical Therapy

## 2013-08-03 DIAGNOSIS — M25569 Pain in unspecified knee: Secondary | ICD-10-CM | POA: Insufficient documentation

## 2013-08-03 DIAGNOSIS — IMO0001 Reserved for inherently not codable concepts without codable children: Secondary | ICD-10-CM | POA: Diagnosis present

## 2013-08-03 DIAGNOSIS — R262 Difficulty in walking, not elsewhere classified: Secondary | ICD-10-CM | POA: Insufficient documentation

## 2013-08-15 ENCOUNTER — Ambulatory Visit: Payer: Medicare Other | Admitting: Rehabilitation

## 2013-08-15 DIAGNOSIS — IMO0001 Reserved for inherently not codable concepts without codable children: Secondary | ICD-10-CM | POA: Diagnosis not present

## 2013-08-17 ENCOUNTER — Ambulatory Visit: Payer: Medicare Other | Admitting: Rehabilitation

## 2013-08-17 DIAGNOSIS — IMO0001 Reserved for inherently not codable concepts without codable children: Secondary | ICD-10-CM | POA: Diagnosis not present

## 2013-08-22 ENCOUNTER — Ambulatory Visit: Payer: Medicare Other | Admitting: Rehabilitation

## 2013-08-22 DIAGNOSIS — IMO0001 Reserved for inherently not codable concepts without codable children: Secondary | ICD-10-CM | POA: Diagnosis not present

## 2013-08-24 ENCOUNTER — Ambulatory Visit: Payer: Medicare Other | Attending: Orthopedic Surgery | Admitting: Rehabilitation

## 2013-08-24 DIAGNOSIS — M25569 Pain in unspecified knee: Secondary | ICD-10-CM | POA: Diagnosis not present

## 2013-08-24 DIAGNOSIS — R262 Difficulty in walking, not elsewhere classified: Secondary | ICD-10-CM | POA: Diagnosis not present

## 2013-08-24 DIAGNOSIS — IMO0001 Reserved for inherently not codable concepts without codable children: Secondary | ICD-10-CM | POA: Diagnosis present

## 2013-08-31 ENCOUNTER — Encounter: Payer: Medicare Other | Admitting: Rehabilitation

## 2013-09-05 ENCOUNTER — Ambulatory Visit: Payer: Medicare Other | Admitting: Physical Therapy

## 2013-09-05 DIAGNOSIS — IMO0001 Reserved for inherently not codable concepts without codable children: Secondary | ICD-10-CM | POA: Diagnosis not present

## 2013-09-07 ENCOUNTER — Ambulatory Visit: Payer: Medicare Other | Admitting: Rehabilitation

## 2013-09-12 ENCOUNTER — Ambulatory Visit: Payer: Medicare Other | Admitting: Rehabilitation

## 2013-09-12 DIAGNOSIS — IMO0001 Reserved for inherently not codable concepts without codable children: Secondary | ICD-10-CM | POA: Diagnosis not present

## 2013-09-14 ENCOUNTER — Ambulatory Visit: Payer: Medicare Other | Admitting: Physical Therapy

## 2013-09-14 DIAGNOSIS — IMO0001 Reserved for inherently not codable concepts without codable children: Secondary | ICD-10-CM | POA: Diagnosis not present

## 2013-09-19 ENCOUNTER — Ambulatory Visit: Payer: Medicare Other | Admitting: Physical Therapy

## 2013-09-19 DIAGNOSIS — IMO0001 Reserved for inherently not codable concepts without codable children: Secondary | ICD-10-CM | POA: Diagnosis not present

## 2013-09-21 ENCOUNTER — Ambulatory Visit: Payer: Medicare Other | Admitting: Physical Therapy

## 2013-09-21 DIAGNOSIS — IMO0001 Reserved for inherently not codable concepts without codable children: Secondary | ICD-10-CM | POA: Diagnosis not present

## 2013-09-26 ENCOUNTER — Encounter: Payer: Medicare Other | Admitting: Physical Therapy

## 2013-09-28 ENCOUNTER — Ambulatory Visit: Payer: Medicare Other | Attending: Orthopedic Surgery | Admitting: Rehabilitation

## 2013-09-28 DIAGNOSIS — IMO0001 Reserved for inherently not codable concepts without codable children: Secondary | ICD-10-CM | POA: Diagnosis present

## 2013-09-28 DIAGNOSIS — R262 Difficulty in walking, not elsewhere classified: Secondary | ICD-10-CM | POA: Diagnosis not present

## 2013-09-28 DIAGNOSIS — M25569 Pain in unspecified knee: Secondary | ICD-10-CM | POA: Insufficient documentation

## 2013-10-03 ENCOUNTER — Ambulatory Visit: Payer: Medicare Other | Admitting: Rehabilitation

## 2013-10-03 DIAGNOSIS — IMO0001 Reserved for inherently not codable concepts without codable children: Secondary | ICD-10-CM | POA: Diagnosis not present

## 2013-10-05 ENCOUNTER — Ambulatory Visit: Payer: Medicare Other | Admitting: Rehabilitation

## 2013-10-05 DIAGNOSIS — IMO0001 Reserved for inherently not codable concepts without codable children: Secondary | ICD-10-CM | POA: Diagnosis not present

## 2013-10-12 ENCOUNTER — Ambulatory Visit: Payer: Medicare Other | Admitting: Physical Therapy

## 2013-10-12 DIAGNOSIS — IMO0001 Reserved for inherently not codable concepts without codable children: Secondary | ICD-10-CM | POA: Diagnosis not present

## 2013-10-14 ENCOUNTER — Ambulatory Visit: Payer: Medicare Other | Admitting: Physical Therapy

## 2013-10-14 DIAGNOSIS — IMO0001 Reserved for inherently not codable concepts without codable children: Secondary | ICD-10-CM | POA: Diagnosis not present

## 2013-10-18 ENCOUNTER — Ambulatory Visit: Payer: Medicare Other | Admitting: Physical Therapy

## 2013-10-18 DIAGNOSIS — IMO0001 Reserved for inherently not codable concepts without codable children: Secondary | ICD-10-CM | POA: Diagnosis not present

## 2013-10-20 ENCOUNTER — Ambulatory Visit: Payer: Medicare Other | Admitting: Physical Therapy

## 2013-10-20 DIAGNOSIS — IMO0001 Reserved for inherently not codable concepts without codable children: Secondary | ICD-10-CM | POA: Diagnosis not present

## 2013-10-24 ENCOUNTER — Encounter: Payer: Medicare Other | Admitting: Physical Therapy

## 2013-10-25 ENCOUNTER — Encounter: Payer: Medicare Other | Admitting: Physical Therapy

## 2013-11-02 ENCOUNTER — Ambulatory Visit: Payer: Medicare Other | Admitting: Physical Therapy

## 2014-05-15 ENCOUNTER — Emergency Department (HOSPITAL_COMMUNITY): Payer: Medicare Other

## 2014-05-15 ENCOUNTER — Encounter (HOSPITAL_COMMUNITY): Payer: Self-pay | Admitting: *Deleted

## 2014-05-15 ENCOUNTER — Emergency Department (HOSPITAL_COMMUNITY)
Admission: EM | Admit: 2014-05-15 | Discharge: 2014-05-15 | Disposition: A | Discharge status: Home / Self Care | Payer: Medicare Other | Attending: Emergency Medicine | Admitting: Emergency Medicine

## 2014-05-15 DIAGNOSIS — M069 Rheumatoid arthritis, unspecified: Secondary | ICD-10-CM | POA: Diagnosis not present

## 2014-05-15 DIAGNOSIS — Z792 Long term (current) use of antibiotics: Secondary | ICD-10-CM | POA: Diagnosis not present

## 2014-05-15 DIAGNOSIS — K219 Gastro-esophageal reflux disease without esophagitis: Secondary | ICD-10-CM | POA: Insufficient documentation

## 2014-05-15 DIAGNOSIS — S99921A Unspecified injury of right foot, initial encounter: Secondary | ICD-10-CM | POA: Insufficient documentation

## 2014-05-15 DIAGNOSIS — W228XXA Striking against or struck by other objects, initial encounter: Secondary | ICD-10-CM | POA: Insufficient documentation

## 2014-05-15 DIAGNOSIS — Y9389 Activity, other specified: Secondary | ICD-10-CM | POA: Insufficient documentation

## 2014-05-15 DIAGNOSIS — Z79899 Other long term (current) drug therapy: Secondary | ICD-10-CM | POA: Diagnosis not present

## 2014-05-15 DIAGNOSIS — M79674 Pain in right toe(s): Secondary | ICD-10-CM

## 2014-05-15 DIAGNOSIS — E119 Type 2 diabetes mellitus without complications: Secondary | ICD-10-CM | POA: Insufficient documentation

## 2014-05-15 DIAGNOSIS — Z7982 Long term (current) use of aspirin: Secondary | ICD-10-CM | POA: Insufficient documentation

## 2014-05-15 DIAGNOSIS — G8929 Other chronic pain: Secondary | ICD-10-CM | POA: Insufficient documentation

## 2014-05-15 DIAGNOSIS — Z87891 Personal history of nicotine dependence: Secondary | ICD-10-CM | POA: Diagnosis not present

## 2014-05-15 DIAGNOSIS — Z791 Long term (current) use of non-steroidal anti-inflammatories (NSAID): Secondary | ICD-10-CM | POA: Diagnosis not present

## 2014-05-15 DIAGNOSIS — E785 Hyperlipidemia, unspecified: Secondary | ICD-10-CM | POA: Insufficient documentation

## 2014-05-15 DIAGNOSIS — I10 Essential (primary) hypertension: Secondary | ICD-10-CM | POA: Insufficient documentation

## 2014-05-15 DIAGNOSIS — Y998 Other external cause status: Secondary | ICD-10-CM | POA: Diagnosis not present

## 2014-05-15 DIAGNOSIS — Y9289 Other specified places as the place of occurrence of the external cause: Secondary | ICD-10-CM | POA: Diagnosis not present

## 2014-05-15 MED ORDER — HYDROCODONE-ACETAMINOPHEN 5-325 MG PO TABS
2.0000 | ORAL_TABLET | Freq: Once | ORAL | Status: AC
  Administered 2014-05-15: 2 via ORAL
  Filled 2014-05-15: qty 2

## 2014-05-15 NOTE — ED Provider Notes (Signed)
CSN: [id number]     Arrival date & time 05/15/14  0912 History  This chart was scribed for non-physician practitioner, Al Corpus, PA-C, working with Thomas Bucy, MD by Ladene Artist, ED Scribe. This patient was seen in room TR09C/TR09C and the patient's care was started at 10:07 AM.   Chief Complaint  Patient presents with  . Toe Injury   The history is provided by the patient. No language interpreter was used.   HPI Comments: Thomas Mcclure is a 59 y.o. male, with a h/o HTN, DM, hyperlipidemia, who presents to the Emergency Department complaining of R great toe injury that occurred 2 days ago. Pt states that he was walking up stairs 2 days ago when he hit his toenail and split his toenail, bleeding is controlled. Pt reports associated throbbing sensation that is exacerbated with touching and movement. Pt reports that he was recently seen by [facility] for fungus which has resolved. Pt has been treating with prescribed Oxycodone.   Past Medical History  Diagnosis Date  . Arthritis     Rheumatoid  . GERD (gastroesophageal reflux disease)   . Hyperlipidemia   . Hypertension     Dr. [name] [phone]  . Chronic back pain   . Neuromuscular disorder     "with nerve damage"  . Diabetes mellitus     diet controlled  . Cyst (solitary) of breast     ? cyst left calf ,knee  . Baker's cyst     Left calf   Past Surgical History  Procedure Laterality Date  . Lumbar spine surgery  03/2009  & 2012  . Wisdom tooth extraction      hx of  . Mass excision N/A 04/19/2012    Procedure: removal of posterior cervical lipoma;  Surgeon: [name], MD;  Location: [place] [facility];  Service: Neurosurgery;  Laterality: N/A;  Removal of posterior cervical lipoma  . Spinal cord stimulator insertion N/A 01/07/2013    Procedure:  SPINAL CORD STIMULATOR INSERTION;  Surgeon: [name], MD;  Location: [place] [facility];  Service: Neurosurgery;  Laterality: N/A;   Family History   Problem Relation Age of Onset  . Heart disease Mother   . Prostate cancer Brother    History  Substance Use Topics  . Smoking status: Former Smoker -- 15 years    Types: Cigarettes  . Smokeless tobacco: Never Used     Comment: 5 cig a week  . Alcohol Use: 0.6 oz/week    1 Cans of beer per week    Review of Systems  Musculoskeletal: Positive for arthralgias.  Skin: Positive for wound.   Allergies  Toradol; Other; and Adhesive  Home Medications   Prior to Admission medications   Medication Sig Start Date End Date Taking? Authorizing Provider  aspirin 81 MG tablet Take 81 mg by mouth daily.    Historical Provider, MD  chlorthalidone (HYGROTON) 25 MG tablet Take 25 mg by mouth daily. 05/31/13   Historical Provider, MD  cyclobenzaprine (FLEXERIL) 10 MG tablet Take 10 mg by mouth 2 (two) times daily. For muscle spasm    Historical Provider, MD  diazepam (VALIUM) 10 MG tablet Take 10 mg by mouth 2 (two) times daily.    Historical Provider, MD  diltiazem (DILACOR XR) 180 MG 24 hr capsule Take 180 mg by mouth daily.    Historical Provider, MD  DULoxetine (CYMBALTA) 60 MG capsule Take 60 mg by mouth daily.    Historical Provider, MD  hydrochlorothiazide (HYDRODIURIL) 25 MG tablet Take 25 mg by mouth daily.    Historical Provider, MD  lisinopril (PRINIVIL,ZESTRIL) 20 MG tablet Take 20 mg by mouth daily.    Historical Provider, MD  meloxicam (MOBIC) 15 MG tablet Take 15 mg by mouth daily.    Historical Provider, MD  metformin (FORTAMET) 500 MG (OSM) 24 hr tablet Take 500 mg by mouth daily. 06/03/13   Historical Provider, MD  mupirocin ointment (BACTROBAN) 2 % Apply 1 application topically daily. 05/31/13   Historical Provider, MD  omeprazole (PRILOSEC) 40 MG capsule Take 40 mg by mouth daily.    Historical Provider, MD  Oxycodone HCl 10 MG TABS Take 1 tablet (10 mg total) by mouth every 4 (four) hours. 01/07/13   Clydell Hakim, MD  potassium chloride SA (K-DUR,KLOR-CON) 20 MEQ tablet Take 20  mEq by mouth daily.     Historical Provider, MD  pravastatin (PRAVACHOL) 40 MG tablet Take 40 mg by mouth daily.    Historical Provider, MD  pregabalin (LYRICA) 100 MG capsule Take 200 mg by mouth daily.     Historical Provider, MD  topiramate (TOPAMAX) 50 MG tablet Take 50 mg by mouth 2 (two) times daily. For nerve pain    Historical Provider, MD  traMADol (ULTRAM) 50 MG tablet Take 100 mg by mouth 3 (three) times daily as needed.     Historical Provider, MD   BP 148/91 mmHg  Pulse 88  Temp(Src) 98 F (36.7 C) (Oral)  Resp 18  Ht 5\' 10"  (1.778 m)  Wt 238 lb (107.956 kg)  BMI 34.15 kg/m2  SpO2 99% Physical Exam  Constitutional: He appears well-developed and well-nourished. No distress.  HENT:  Head: Normocephalic and atraumatic.  Eyes: Conjunctivae are normal. Right eye exhibits no discharge. Left eye exhibits no discharge.  Cardiovascular:  Pulses:      Dorsalis pedis pulses are 2+ on the right side, and 2+ on the left side.  Pulmonary/Chest: Effort normal. No respiratory distress.  Musculoskeletal:  Strength with senation of bilateral foot and ankle intact. Horizontal crease to mid R big toenail. Toenail firmly in place. No hematoma. Some dried blood.   Neurological: He is alert. Coordination normal.  Skin: He is not diaphoretic.  Psychiatric: He has a normal mood and affect. His behavior is normal.  Nursing note and vitals reviewed.  ED Course  Procedures (including critical care time) DIAGNOSTIC STUDIES: Oxygen Saturation is 99% on RA, normal by my interpretation.    COORDINATION OF CARE: 10:13 AM-Discussed treatment plan which includes XR and Vicodin with pt at bedside and pt agreed to plan.   Labs Review Labs Reviewed - No data to display  Imaging Review Dg Foot Complete Right  05/15/2014   CLINICAL DATA:  Recent stubbing of first toe splitting the toenail, initial encounter  EXAM: RIGHT FOOT COMPLETE - 3+ VIEW  COMPARISON:  None.  FINDINGS: There is no evidence of  fracture or dislocation. There is no evidence of arthropathy or other focal bone abnormality. Soft tissues are unremarkable.  IMPRESSION: No acute abnormality noted.   Electronically Signed   By: Inez Catalina M.D.   On: 05/15/2014 11:02    EKG Interpretation None      MDM   Final diagnoses:  Pain of toe of right foot   Ptpresenting with toe pain and injury. Neurovascularly intact. VSS. No evidence of acute abnormality on x-ray. Patient's pain treated in ED. Did not remove toenail in order to provide protection from infection. No  open wound or fracture. K patient note for all lower onset increased narcotic use for acute toe pain from injury. No prescription provided emergency room in order not to violate pain contract.  Discussed return precautions with patient. Discussed all results and patient verbalizes understanding and agrees with plan.  I personally performed the services described in this documentation, which was scribed in my presence. The recorded information has been reviewed and is accurate.   Al Corpus, PA-C 05/15/14 [place], MD 05/15/14 [phone]

## 2014-05-15 NOTE — ED Notes (Signed)
Pt is going to pain management for chronic back and leg pain

## 2014-05-15 NOTE — Discharge Instructions (Signed)
Return to the emergency room with worsening of symptoms, new symptoms or with symptoms that are concerning, Thomas Mcclure fevers, redness, swelling, numbness, tingling, unable to move toe. Please call your doctor for a followup appointment within 24-48 hours. When you talk to your doctor please let them know that you were seen in the emergency department and have them acquire all of your records so that they can discuss the findings with you and formulate a treatment plan to fully care for your new and ongoing problems. [facility] further workup and management of her bilateral feet as well as your toe pain. Read below information and follow recommendations. Crush Injury, Fingers or Toes A crush injury to the fingers or toes means the tissues have been damaged by being squeezed (compressed). There will be bleeding into the tissues and swelling. Often, blood will collect under the skin. When this happens, the skin on the finger often dies and may slough off (shed) 1 week to 10 days later. Usually, new skin is growing underneath. If the injury has been too severe and the tissue does not survive, the damaged tissue may begin to turn black over several days.  Wounds which occur because of the crushing may be stitched (sutured) shut. However, crush injuries are more likely to become infected than other injuries.These wounds may not be closed as tightly as other types of cuts to prevent infection. Nails involved are often lost. These usually grow back over several weeks.  DIAGNOSIS X-rays may be taken to see if there is any injury to the bones. TREATMENT Broken bones (fractures) may be treated with splinting, depending on the fracture. Often, no treatment is required for fractures of the last bone in the fingers or toes. HOME CARE INSTRUCTIONS   The crushed part should be raised (elevated) above the heart or center of the chest as much as possible for the first several  days or as directed. This helps with pain and lessens swelling. Less swelling increases the chances that the crushed part will survive.  Put ice on the injured area.  Put ice in a plastic bag.  Place a towel between your skin and the bag.  Leave the ice on for 15-20 minutes, 03-04 times a day for the first 2 days.  Only take over-the-counter or prescription medicines for pain, discomfort, or fever as directed by your caregiver.  Use your injured part only as directed.  Change your bandages (dressings) as directed.  Keep all follow-up appointments as directed by your caregiver. Not keeping your appointment could result in a chronic or permanent injury, pain, and disability. If there is any problem keeping the appointment, you must call to reschedule. SEEK IMMEDIATE MEDICAL CARE IF:   There is redness, swelling, or increasing pain in the wound area.  Pus is coming from the wound.  You have a fever.  You notice a bad smell coming from the wound or dressing.  The edges of the wound do not stay together after the sutures have been removed.  You are unable to move the injured finger or toe. MAKE SURE YOU:   Understand these instructions.  Will watch your condition.  Will get help right away if you are not doing well or get worse. Document Released: 02/10/2005 Document Revised: 05/05/2011 Document Reviewed: 06/28/2010 [facility] Patient Information 2015 [place], [place]. This information is not intended to replace advice given to you by your health care provider. Make sure you discuss any questions  you have with your health care provider.  Fingernail or Toenail Loss All or part of your fingernail or toenail has been lost. This may or may not grow back as a normal nail. A special non-stick bandage has been put on your finger or toe tightly to prevent bleeding. HOME CARE INSTRUCTIONS  The tips of fingers and toes are full of nerves and injuries are often very painful. The following  will help you decrease the pain and obtain the best outcome.  Keep your hand or foot elevated above your heart to relieve pain and swelling. This will require lying in bed or on a couch with the hand or leg on pillows or sitting in a recliner with the leg up. Letting your hand or leg dangle may increase swelling, slow healing and cause throbbing pain.  Keep your dressing dry and clean.  Change your bandage in 24 hours after going home.  After your bandage is changed, soak your hand or foot in warm soapy water for 10 to 20 minutes. Do this 3 times per day. This helps reduce pain and swelling. After soaking, apply a clean, dry bandage. Change your bandage if it is wet or dirty.  Only take over-the-counter or prescription medicines for pain, discomfort, or fever as directed by your caregiver.  See your caregiver as needed for problems. SEEK IMMEDIATE MEDICAL CARE IF:   You have increased pain, swelling, drainage, or bleeding.  You have a fever. MAKE SURE YOU:   Understand these instructions.  Will watch your condition.  Will get help right away if you are not doing well or get worse. Document Released: 01/02/2006 Document Revised: 05/05/2011 Document Reviewed: 03/24/2006 [facility] Patient Information 2015 [place], [place]. This information is not intended to replace advice given to you by your health care provider. Make sure you discuss any questions you have with your health care provider.

## 2014-05-15 NOTE — ED Notes (Signed)
Pt reports he hit his Rt great toe nail on the stairs on SAT. Pt nail split half way down the toe.

## 2014-05-15 NOTE — ED Notes (Signed)
Pt requested post-op shoe. Notified EDP. Post-op shoe placed on pt.

## 2015-02-25 DIAGNOSIS — I219 Acute myocardial infarction, unspecified: Secondary | ICD-10-CM

## 2015-02-25 HISTORY — DX: Acute myocardial infarction, unspecified: I21.9

## 2015-07-15 ENCOUNTER — Other Ambulatory Visit (HOSPITAL_COMMUNITY): Payer: Medicare Other

## 2015-07-15 ENCOUNTER — Emergency Department (HOSPITAL_COMMUNITY): Payer: Medicare Other

## 2015-07-15 ENCOUNTER — Encounter (HOSPITAL_COMMUNITY)
Admission: EM | Disposition: A | Discharge status: Home / Self Care | Payer: Self-pay | Source: Home / Self Care | Attending: Internal Medicine

## 2015-07-15 ENCOUNTER — Encounter (HOSPITAL_COMMUNITY): Payer: Self-pay | Admitting: *Deleted

## 2015-07-15 ENCOUNTER — Inpatient Hospital Stay (HOSPITAL_COMMUNITY)
Admission: EM | Admit: 2015-07-15 | Discharge: 2015-07-18 | DRG: 247 | Disposition: A | Discharge status: Home / Self Care | Payer: Medicare Other | Attending: Internal Medicine | Admitting: Internal Medicine

## 2015-07-15 ENCOUNTER — Inpatient Hospital Stay (HOSPITAL_COMMUNITY): Payer: Medicare Other

## 2015-07-15 DIAGNOSIS — Z8249 Family history of ischemic heart disease and other diseases of the circulatory system: Secondary | ICD-10-CM | POA: Diagnosis not present

## 2015-07-15 DIAGNOSIS — K219 Gastro-esophageal reflux disease without esophagitis: Secondary | ICD-10-CM | POA: Diagnosis present

## 2015-07-15 DIAGNOSIS — Z87891 Personal history of nicotine dependence: Secondary | ICD-10-CM

## 2015-07-15 DIAGNOSIS — E785 Hyperlipidemia, unspecified: Secondary | ICD-10-CM | POA: Diagnosis present

## 2015-07-15 DIAGNOSIS — Z955 Presence of coronary angioplasty implant and graft: Secondary | ICD-10-CM

## 2015-07-15 DIAGNOSIS — Z7982 Long term (current) use of aspirin: Secondary | ICD-10-CM | POA: Diagnosis not present

## 2015-07-15 DIAGNOSIS — I10 Essential (primary) hypertension: Secondary | ICD-10-CM | POA: Diagnosis present

## 2015-07-15 DIAGNOSIS — E119 Type 2 diabetes mellitus without complications: Secondary | ICD-10-CM | POA: Diagnosis present

## 2015-07-15 DIAGNOSIS — Z91048 Other nonmedicinal substance allergy status: Secondary | ICD-10-CM

## 2015-07-15 DIAGNOSIS — Z888 Allergy status to other drugs, medicaments and biological substances status: Secondary | ICD-10-CM

## 2015-07-15 DIAGNOSIS — E876 Hypokalemia: Secondary | ICD-10-CM | POA: Diagnosis present

## 2015-07-15 DIAGNOSIS — G8929 Other chronic pain: Secondary | ICD-10-CM | POA: Diagnosis present

## 2015-07-15 DIAGNOSIS — M069 Rheumatoid arthritis, unspecified: Secondary | ICD-10-CM | POA: Diagnosis present

## 2015-07-15 DIAGNOSIS — I252 Old myocardial infarction: Secondary | ICD-10-CM | POA: Diagnosis present

## 2015-07-15 DIAGNOSIS — R079 Chest pain, unspecified: Secondary | ICD-10-CM | POA: Diagnosis not present

## 2015-07-15 DIAGNOSIS — I2510 Atherosclerotic heart disease of native coronary artery without angina pectoris: Secondary | ICD-10-CM | POA: Diagnosis present

## 2015-07-15 DIAGNOSIS — M549 Dorsalgia, unspecified: Secondary | ICD-10-CM | POA: Diagnosis present

## 2015-07-15 DIAGNOSIS — I214 Non-ST elevation (NSTEMI) myocardial infarction: Principal | ICD-10-CM | POA: Diagnosis present

## 2015-07-15 DIAGNOSIS — Z79899 Other long term (current) drug therapy: Secondary | ICD-10-CM

## 2015-07-15 HISTORY — PX: CARDIAC CATHETERIZATION: SHX172

## 2015-07-15 HISTORY — DX: Atherosclerotic heart disease of native coronary artery without angina pectoris: I25.10

## 2015-07-15 LAB — GLUCOSE, CAPILLARY
Glucose-Capillary: 127 mg/dL — ABNORMAL HIGH (ref 65–99)
Glucose-Capillary: 121 mg/dL — ABNORMAL HIGH (ref 65–99)
Glucose-Capillary: 122 mg/dL — ABNORMAL HIGH (ref 65–99)
Glucose-Capillary: 102 mg/dL — ABNORMAL HIGH (ref 65–99)

## 2015-07-15 LAB — CBC
HCT: 37 % — ABNORMAL LOW (ref 39.0–52.0)
Hemoglobin: 12.1 g/dL — ABNORMAL LOW (ref 13.0–17.0)
MCH: 27.6 pg (ref 26.0–34.0)
MCHC: 32.7 g/dL (ref 30.0–36.0)
MCV: 84.3 fL (ref 78.0–100.0)
Platelets: 224 10*3/uL (ref 150–400)
RBC: 4.39 MIL/uL (ref 4.22–5.81)
RDW: 15.6 % — ABNORMAL HIGH (ref 11.5–15.5)
WBC: 18.1 10*3/uL — ABNORMAL HIGH (ref 4.0–10.5)

## 2015-07-15 LAB — I-STAT TROPONIN, ED: Troponin i, poc: 23.36 ng/mL (ref 0.00–0.08)

## 2015-07-15 LAB — COMPREHENSIVE METABOLIC PANEL
ALT: 59 U/L (ref 17–63)
AST: 160 U/L — ABNORMAL HIGH (ref 15–41)
Albumin: 3.7 g/dL (ref 3.5–5.0)
Alkaline Phosphatase: 61 U/L (ref 38–126)
Anion gap: 13 (ref 5–15)
BUN: 10 mg/dL (ref 6–20)
CO2: 26 mmol/L (ref 22–32)
Calcium: 9.3 mg/dL (ref 8.9–10.3)
Chloride: 97 mmol/L — ABNORMAL LOW (ref 101–111)
Creatinine, Ser: 1.1 mg/dL (ref 0.61–1.24)
GFR calc Af Amer: >60 mL/min (ref >60)
GFR calc non Af Amer: >60 mL/min (ref >60)
Glucose, Bld: 132 mg/dL — ABNORMAL HIGH (ref 65–99)
Potassium: 3.3 mmol/L — ABNORMAL LOW (ref 3.5–5.1)
Sodium: 136 mmol/L (ref 135–145)
Total Bilirubin: 0.8 mg/dL (ref 0.3–1.2)
Total Protein: 7.1 g/dL (ref 6.5–8.1)

## 2015-07-15 LAB — CBC WITH DIFFERENTIAL/PLATELET
Basophils Absolute: 0 10*3/uL (ref 0.0–0.1)
Basophils Relative: 0 %
Eosinophils Absolute: 0 10*3/uL (ref 0.0–0.7)
Eosinophils Relative: 0 %
HCT: 38.5 % — ABNORMAL LOW (ref 39.0–52.0)
Hemoglobin: 12.8 g/dL — ABNORMAL LOW (ref 13.0–17.0)
Lymphocytes Relative: 10 %
Lymphs Abs: 1.8 10*3/uL (ref 0.7–4.0)
MCH: 27.3 pg (ref 26.0–34.0)
MCHC: 33.2 g/dL (ref 30.0–36.0)
MCV: 82.1 fL (ref 78.0–100.0)
Monocytes Absolute: 2.4 10*3/uL — ABNORMAL HIGH (ref 0.1–1.0)
Monocytes Relative: 14 %
Neutro Abs: 12.6 10*3/uL — ABNORMAL HIGH (ref 1.7–7.7)
Neutrophils Relative %: 76 %
Platelets: 236 10*3/uL (ref 150–400)
RBC: 4.69 MIL/uL (ref 4.22–5.81)
RDW: 15.5 % (ref 11.5–15.5)
WBC: 16.8 10*3/uL — ABNORMAL HIGH (ref 4.0–10.5)

## 2015-07-15 LAB — I-STAT CHEM 8, ED
BUN: 12 mg/dL (ref 6–20)
Calcium, Ion: 1.05 mmol/L — ABNORMAL LOW (ref 1.12–1.23)
Chloride: 98 mmol/L — ABNORMAL LOW (ref 101–111)
Creatinine, Ser: 1 mg/dL (ref 0.61–1.24)
Glucose, Bld: 125 mg/dL — ABNORMAL HIGH (ref 65–99)
HCT: 54 % — ABNORMAL HIGH (ref 39.0–52.0)
Hemoglobin: 18.4 g/dL — ABNORMAL HIGH (ref 13.0–17.0)
Potassium: 3.3 mmol/L — ABNORMAL LOW (ref 3.5–5.1)
Sodium: 137 mmol/L (ref 135–145)
TCO2: 25 mmol/L (ref 0–100)

## 2015-07-15 LAB — PROTIME-INR
INR: 1.21 (ref 0.00–1.49)
Prothrombin Time: 15.4 seconds — ABNORMAL HIGH (ref 11.6–15.2)

## 2015-07-15 LAB — LIPID PANEL
Cholesterol: 149 mg/dL (ref 0–200)
HDL: 37 mg/dL — ABNORMAL LOW (ref >40)
LDL Cholesterol: 96 mg/dL (ref 0–99)
Total CHOL/HDL Ratio: 4 RATIO
Triglycerides: 82 mg/dL (ref <150)
VLDL: 16 mg/dL (ref 0–40)

## 2015-07-15 LAB — PLATELET COUNT: Platelets: 220 10*3/uL (ref 150–400)

## 2015-07-15 LAB — ECHOCARDIOGRAM COMPLETE
Height: 69 in
Weight: 3587.33 oz

## 2015-07-15 LAB — TROPONIN I
Troponin I: 30.26 ng/mL (ref <0.031)
Troponin I: 34.97 ng/mL (ref <0.031)
Troponin I: 37.38 ng/mL (ref <0.031)

## 2015-07-15 LAB — MRSA PCR SCREENING: MRSA by PCR: NEGATIVE

## 2015-07-15 SURGERY — LEFT HEART CATH AND CORONARY ANGIOGRAPHY
Anesthesia: LOCAL

## 2015-07-15 MED ORDER — PRAVASTATIN SODIUM 40 MG PO TABS: 40.0000 mg | ORAL_TABLET | Freq: Every day | ORAL | Status: DC

## 2015-07-15 MED ORDER — PREGABALIN 100 MG PO CAPS
200.0000 mg | ORAL_CAPSULE | Freq: Every day | ORAL | Status: DC
  Administered 2015-07-16 – 2015-07-18 (×3): 200 mg via ORAL
  Filled 2015-07-15 (×2): qty 4
  Filled 2015-07-15: qty 2

## 2015-07-15 MED ORDER — TIROFIBAN (AGGRASTAT) BOLUS VIA INFUSION
INTRAVENOUS | Status: DC | PRN
  Administered 2015-07-15: 2542.5 ug via INTRAVENOUS

## 2015-07-15 MED ORDER — SODIUM CHLORIDE 0.9% FLUSH: 3.0000 mL | Freq: Two times a day (BID) | INTRAVENOUS | Status: DC

## 2015-07-15 MED ORDER — MORPHINE SULFATE (PF) 4 MG/ML IV SOLN
4.0000 mg | Freq: Once | INTRAVENOUS | Status: AC
  Administered 2015-07-15: 4 mg via INTRAVENOUS
  Filled 2015-07-15: qty 1

## 2015-07-15 MED ORDER — IOPAMIDOL (ISOVUE-370) INJECTION 76%
INTRAVENOUS | Status: AC
  Administered 2015-07-15: 100 mL
  Filled 2015-07-15: qty 100

## 2015-07-15 MED ORDER — TICAGRELOR 90 MG PO TABS
ORAL_TABLET | ORAL | Status: DC | PRN
  Administered 2015-07-15: 180 mg via ORAL

## 2015-07-15 MED ORDER — TICAGRELOR 90 MG PO TABS
90.0000 mg | ORAL_TABLET | Freq: Two times a day (BID) | ORAL | Status: DC
  Administered 2015-07-15 – 2015-07-18 (×6): 90 mg via ORAL
  Filled 2015-07-15 (×6): qty 1

## 2015-07-15 MED ORDER — FENTANYL CITRATE (PF) 100 MCG/2ML IJ SOLN
INTRAMUSCULAR | Status: AC
  Filled 2015-07-15: qty 2

## 2015-07-15 MED ORDER — MIDAZOLAM HCL 2 MG/2ML IJ SOLN
INTRAMUSCULAR | Status: AC
  Filled 2015-07-15: qty 2

## 2015-07-15 MED ORDER — ONDANSETRON HCL 4 MG/2ML IJ SOLN
4.0000 mg | Freq: Once | INTRAMUSCULAR | Status: AC
  Administered 2015-07-15: 4 mg via INTRAVENOUS
  Filled 2015-07-15: qty 2

## 2015-07-15 MED ORDER — SODIUM CHLORIDE 0.9% FLUSH: 3.0000 mL | INTRAVENOUS | Status: DC | PRN

## 2015-07-15 MED ORDER — OXYCODONE HCL 5 MG PO TABS
10.0000 mg | ORAL_TABLET | Freq: Four times a day (QID) | ORAL | Status: DC | PRN
  Administered 2015-07-16 – 2015-07-18 (×4): 10 mg via ORAL
  Filled 2015-07-15 (×4): qty 2

## 2015-07-15 MED ORDER — HYDROCHLOROTHIAZIDE 25 MG PO TABS
25.0000 mg | ORAL_TABLET | Freq: Every day | ORAL | Status: DC
Start: 2015-07-15 — End: 2015-07-15

## 2015-07-15 MED ORDER — SODIUM CHLORIDE 0.9 % WEIGHT BASED INFUSION: 1.0000 mL/kg/h | INTRAVENOUS | Status: DC

## 2015-07-15 MED ORDER — IOPAMIDOL (ISOVUE-370) INJECTION 76%
INTRAVENOUS | Status: AC
  Filled 2015-07-15: qty 100

## 2015-07-15 MED ORDER — ASPIRIN 81 MG PO CHEW: 81.0000 mg | CHEWABLE_TABLET | ORAL | Status: DC

## 2015-07-15 MED ORDER — SODIUM CHLORIDE 0.9 % IV SOLN: 250.0000 mL | INTRAVENOUS | Status: DC | PRN

## 2015-07-15 MED ORDER — ASPIRIN 81 MG PO CHEW
324.0000 mg | CHEWABLE_TABLET | Freq: Once | ORAL | Status: AC
  Administered 2015-07-15: 324 mg via ORAL
  Filled 2015-07-15: qty 4

## 2015-07-15 MED ORDER — ASPIRIN EC 81 MG PO TBEC
81.0000 mg | DELAYED_RELEASE_TABLET | Freq: Every day | ORAL | Status: DC
  Administered 2015-07-16 – 2015-07-18 (×3): 81 mg via ORAL
  Filled 2015-07-15 (×3): qty 1

## 2015-07-15 MED ORDER — SODIUM CHLORIDE 0.9 % WEIGHT BASED INFUSION
1.0000 mL/kg/h | INTRAVENOUS | Status: DC
Start: 2015-07-15 — End: 2015-07-15

## 2015-07-15 MED ORDER — SODIUM CHLORIDE 0.9 % IV SOLN: INTRAVENOUS | Status: DC

## 2015-07-15 MED ORDER — TIROFIBAN HCL IN NACL 5-0.9 MG/100ML-% IV SOLN
0.1500 ug/kg/min | INTRAVENOUS | Status: DC
  Administered 2015-07-15 – 2015-07-16 (×4): 0.15 ug/kg/min via INTRAVENOUS
  Filled 2015-07-15 (×4): qty 100

## 2015-07-15 MED ORDER — NITROGLYCERIN 1 MG/10 ML FOR IR/CATH LAB
INTRA_ARTERIAL | Status: AC
  Filled 2015-07-15: qty 10

## 2015-07-15 MED ORDER — VERAPAMIL HCL 2.5 MG/ML IV SOLN
INTRAVENOUS | Status: AC
Start: 2015-07-15 — End: 2015-07-15
  Filled 2015-07-15: qty 2

## 2015-07-15 MED ORDER — SODIUM CHLORIDE 0.9% FLUSH
3.0000 mL | Freq: Two times a day (BID) | INTRAVENOUS | Status: DC
  Administered 2015-07-15 – 2015-07-18 (×5): 3 mL via INTRAVENOUS

## 2015-07-15 MED ORDER — ONDANSETRON HCL 4 MG/2ML IJ SOLN
INTRAMUSCULAR | Status: DC | PRN
  Administered 2015-07-15: 4 mg via INTRAVENOUS

## 2015-07-15 MED ORDER — ONDANSETRON HCL 4 MG/2ML IJ SOLN: 4.0000 mg | Freq: Four times a day (QID) | INTRAMUSCULAR | Status: DC | PRN

## 2015-07-15 MED ORDER — LIDOCAINE HCL (PF) 1 % IJ SOLN
INTRAMUSCULAR | Status: AC
  Filled 2015-07-15: qty 30

## 2015-07-15 MED ORDER — IOPAMIDOL (ISOVUE-370) INJECTION 76%
INTRAVENOUS | Status: AC
  Filled 2015-07-15: qty 125

## 2015-07-15 MED ORDER — ONDANSETRON HCL 4 MG/2ML IJ SOLN
INTRAMUSCULAR | Status: AC
  Filled 2015-07-15: qty 2

## 2015-07-15 MED ORDER — DIAZEPAM 5 MG PO TABS
10.0000 mg | ORAL_TABLET | Freq: Two times a day (BID) | ORAL | Status: DC | PRN
  Administered 2015-07-15 – 2015-07-17 (×3): 10 mg via ORAL
  Filled 2015-07-15 (×3): qty 2

## 2015-07-15 MED ORDER — ATORVASTATIN CALCIUM 80 MG PO TABS
80.0000 mg | ORAL_TABLET | Freq: Every day | ORAL | Status: DC
  Administered 2015-07-15 – 2015-07-17 (×3): 80 mg via ORAL
  Filled 2015-07-15 (×3): qty 1

## 2015-07-15 MED ORDER — HEPARIN (PORCINE) IN NACL 2-0.9 UNIT/ML-% IJ SOLN
INTRAMUSCULAR | Status: DC | PRN
  Administered 2015-07-15: 10:00:00

## 2015-07-15 MED ORDER — TICAGRELOR 90 MG PO TABS
ORAL_TABLET | ORAL | Status: AC
  Filled 2015-07-15: qty 2

## 2015-07-15 MED ORDER — NITROGLYCERIN 0.4 MG SL SUBL
0.4000 mg | SUBLINGUAL_TABLET | SUBLINGUAL | Status: AC | PRN
  Administered 2015-07-15 (×3): 0.4 mg via SUBLINGUAL

## 2015-07-15 MED ORDER — ONDANSETRON HCL 4 MG PO TABS
4.0000 mg | ORAL_TABLET | Freq: Four times a day (QID) | ORAL | Status: DC | PRN
Start: 2015-07-15 — End: 2015-07-17

## 2015-07-15 MED ORDER — NITROGLYCERIN IN D5W 200-5 MCG/ML-% IV SOLN
0.0000 ug/min | Freq: Once | INTRAVENOUS | Status: AC
  Administered 2015-07-15: 5 ug/min via INTRAVENOUS
  Filled 2015-07-15: qty 250

## 2015-07-15 MED ORDER — TRAMADOL HCL 50 MG PO TABS
100.0000 mg | ORAL_TABLET | Freq: Three times a day (TID) | ORAL | Status: DC | PRN
  Administered 2015-07-15 – 2015-07-16 (×2): 100 mg via ORAL
  Filled 2015-07-15 (×2): qty 2

## 2015-07-15 MED ORDER — LIDOCAINE HCL (PF) 1 % IJ SOLN
INTRAMUSCULAR | Status: DC | PRN
  Administered 2015-07-15: 2 mL

## 2015-07-15 MED ORDER — HEPARIN (PORCINE) IN NACL 2-0.9 UNIT/ML-% IJ SOLN
INTRAMUSCULAR | Status: AC
  Filled 2015-07-15: qty 1500

## 2015-07-15 MED ORDER — IOPAMIDOL (ISOVUE-370) INJECTION 76%
INTRAVENOUS | Status: DC | PRN
  Administered 2015-07-15: 175 mL

## 2015-07-15 MED ORDER — TIROFIBAN HCL IN NACL 5-0.9 MG/100ML-% IV SOLN
INTRAVENOUS | Status: AC
  Filled 2015-07-15: qty 100

## 2015-07-15 MED ORDER — DULOXETINE HCL 60 MG PO CPEP
60.0000 mg | ORAL_CAPSULE | Freq: Every day | ORAL | Status: DC
  Administered 2015-07-16 – 2015-07-18 (×3): 60 mg via ORAL
  Filled 2015-07-15 (×3): qty 1

## 2015-07-15 MED ORDER — HYDROMORPHONE HCL 1 MG/ML IJ SOLN
1.0000 mg | Freq: Once | INTRAMUSCULAR | Status: AC
  Administered 2015-07-15: 1 mg via INTRAVENOUS
  Filled 2015-07-15: qty 1

## 2015-07-15 MED ORDER — ASPIRIN 81 MG PO CHEW: 324.0000 mg | CHEWABLE_TABLET | Freq: Once | ORAL | Status: DC

## 2015-07-15 MED ORDER — TOPIRAMATE 25 MG PO TABS
50.0000 mg | ORAL_TABLET | Freq: Two times a day (BID) | ORAL | Status: DC
  Administered 2015-07-15 – 2015-07-18 (×6): 50 mg via ORAL
  Filled 2015-07-15 (×7): qty 2

## 2015-07-15 MED ORDER — HEPARIN BOLUS VIA INFUSION
4000.0000 [IU] | Freq: Once | INTRAVENOUS | Status: DC
  Filled 2015-07-15: qty 4000

## 2015-07-15 MED ORDER — VERAPAMIL HCL 2.5 MG/ML IV SOLN
INTRAVENOUS | Status: DC | PRN
  Administered 2015-07-15: 10 mL via INTRA_ARTERIAL

## 2015-07-15 MED ORDER — FENTANYL CITRATE (PF) 100 MCG/2ML IJ SOLN
INTRAMUSCULAR | Status: DC | PRN
  Administered 2015-07-15: 25 ug via INTRAVENOUS
  Administered 2015-07-15 (×2): 50 ug via INTRAVENOUS

## 2015-07-15 MED ORDER — TIROFIBAN HCL IN NACL 5-0.9 MG/100ML-% IV SOLN
INTRAVENOUS | Status: DC | PRN
  Administered 2015-07-15: 0.15 ug/kg/min via INTRAVENOUS

## 2015-07-15 MED ORDER — SODIUM CHLORIDE 0.9% FLUSH
3.0000 mL | Freq: Two times a day (BID) | INTRAVENOUS | Status: DC
  Administered 2015-07-16 – 2015-07-18 (×4): 3 mL via INTRAVENOUS

## 2015-07-15 MED ORDER — MORPHINE SULFATE (PF) 4 MG/ML IV SOLN
4.0000 mg | INTRAVENOUS | Status: DC | PRN
  Administered 2015-07-16: 4 mg via INTRAVENOUS
  Filled 2015-07-15 (×2): qty 1

## 2015-07-15 MED ORDER — HEPARIN SODIUM (PORCINE) 1000 UNIT/ML IJ SOLN
INTRAMUSCULAR | Status: AC
  Filled 2015-07-15: qty 1

## 2015-07-15 MED ORDER — PANTOPRAZOLE SODIUM 40 MG PO TBEC
40.0000 mg | DELAYED_RELEASE_TABLET | Freq: Every day | ORAL | Status: DC
  Administered 2015-07-16 – 2015-07-18 (×3): 40 mg via ORAL
  Filled 2015-07-15 (×3): qty 1

## 2015-07-15 MED ORDER — CYCLOBENZAPRINE HCL 10 MG PO TABS
10.0000 mg | ORAL_TABLET | Freq: Two times a day (BID) | ORAL | Status: DC | PRN
  Administered 2015-07-16: 10 mg via ORAL
  Filled 2015-07-15: qty 1

## 2015-07-15 MED ORDER — POTASSIUM CHLORIDE CRYS ER 10 MEQ PO TBCR
10.0000 meq | EXTENDED_RELEASE_TABLET | Freq: Every day | ORAL | Status: DC
  Filled 2015-07-15 (×2): qty 1

## 2015-07-15 MED ORDER — ENOXAPARIN SODIUM 40 MG/0.4ML ~~LOC~~ SOLN
40.0000 mg | SUBCUTANEOUS | Status: DC
  Administered 2015-07-16 – 2015-07-18 (×3): 40 mg via SUBCUTANEOUS
  Filled 2015-07-15 (×3): qty 0.4

## 2015-07-15 MED ORDER — NITROGLYCERIN IN D5W 200-5 MCG/ML-% IV SOLN
0.0000 ug/min | INTRAVENOUS | Status: DC
  Administered 2015-07-15: 30 ug/min via INTRAVENOUS

## 2015-07-15 MED ORDER — MIDAZOLAM HCL 2 MG/2ML IJ SOLN
INTRAMUSCULAR | Status: DC | PRN
  Administered 2015-07-15: 1 mg via INTRAVENOUS

## 2015-07-15 MED ORDER — BIVALIRUDIN 250 MG IV SOLR
INTRAVENOUS | Status: AC
  Filled 2015-07-15: qty 250

## 2015-07-15 MED ORDER — LISINOPRIL 20 MG PO TABS: 20.0000 mg | ORAL_TABLET | Freq: Every day | ORAL | Status: DC

## 2015-07-15 MED ORDER — HEPARIN SODIUM (PORCINE) 1000 UNIT/ML IJ SOLN
INTRAMUSCULAR | Status: DC | PRN
  Administered 2015-07-15: 8000 [IU] via INTRAVENOUS

## 2015-07-15 MED ORDER — HEPARIN (PORCINE) IN NACL 100-0.45 UNIT/ML-% IJ SOLN
1350.0000 [IU]/h | INTRAMUSCULAR | Status: DC
  Administered 2015-07-15: 1350 [IU]/h via INTRAVENOUS
  Filled 2015-07-15: qty 250

## 2015-07-15 MED ORDER — INSULIN ASPART 100 UNIT/ML ~~LOC~~ SOLN
0.0000 [IU] | Freq: Three times a day (TID) | SUBCUTANEOUS | Status: DC
  Administered 2015-07-15 – 2015-07-18 (×4): 2 [IU] via SUBCUTANEOUS

## 2015-07-15 SURGICAL SUPPLY — 23 items
BALLN EUPHORA RX 3.0X20 (BALLOONS) ×2
BALLN TREK RX 2.5X15 (BALLOONS) ×2
BALLN ~~LOC~~ EUPHORA RX 3.75X15 (BALLOONS) ×2
BALLOON EUPHORA RX 3.0X20 (BALLOONS) IMPLANT
BALLOON TREK RX 2.5X15 (BALLOONS) IMPLANT
BALLOON ~~LOC~~ EUPHORA RX 3.75X15 (BALLOONS) IMPLANT
CATH EXTRAC PRONTO 5.5F 138CM (CATHETERS) ×1 IMPLANT
CATH INFINITI 5 FR JL3.5 (CATHETERS) ×1 IMPLANT
CATH INFINITI 5FR ANG PIGTAIL (CATHETERS) ×1 IMPLANT
CATH INFINITI JR4 5F (CATHETERS) ×1 IMPLANT
CATH VISTA GUIDE 6FR JR4 (CATHETERS) ×1 IMPLANT
DEVICE RAD COMP TR BAND LRG (VASCULAR PRODUCTS) ×1 IMPLANT
ELECT DEFIB PAD ADLT CADENCE (PAD) ×1 IMPLANT
GLIDESHEATH SLEND SS 6F .021 (SHEATH) ×1 IMPLANT
KIT ENCORE 26 ADVANTAGE (KITS) ×1 IMPLANT
KIT HEART LEFT (KITS) ×2 IMPLANT
PACK CARDIAC CATHETERIZATION (CUSTOM PROCEDURE TRAY) ×2 IMPLANT
STENT PROMUS PREM MR 3.5X24 (Permanent Stent) ×1 IMPLANT
SYR MEDRAD MARK V 150ML (SYRINGE) ×2 IMPLANT
TRANSDUCER W/STOPCOCK (MISCELLANEOUS) ×2 IMPLANT
TUBING CIL FLEX 10 FLL-RA (TUBING) ×2 IMPLANT
WIRE ASAHI PROWATER 180CM (WIRE) ×1 IMPLANT
WIRE SAFE-T 1.5MM-J .035X260CM (WIRE) ×1 IMPLANT

## 2015-07-15 NOTE — ED Notes (Signed)
Pt arrives via [facility] with c/o cp, back pain and rt shoulder pain. Pt states he hurt himself on Wednesday while mowing the grass and has ongoing pain.

## 2015-07-15 NOTE — Progress Notes (Signed)
ANTICOAGULATION CONSULT NOTE - Initial Consult  Pharmacy Consult for Heparin Indication: NSTEMI  Allergies  Allergen Reactions  . Toradol [Ketorolac Tromethamine] Anaphylaxis    bp dropped, diaphoresis  . Other     Allergic reaction to steroid? Unsure of name  . Adhesive [Tape] Rash and Other (See Comments)    "blisters"    Patient Measurements:    Ht: 70 in  Wt: 108 kg IBW: 73 kg Heparin Dosing Weight: 96 kg  Vital Signs: Temp: 99.7 F (37.6 C) (05/21 0319) Temp Source: Oral (05/21 0319) BP: 129/93 mmHg (05/21 0300) Pulse Rate: 95 (05/21 0300)  Labs:  Recent Labs  07/15/15 0350 07/15/15 0425  HGB 12.8* 18.4*  HCT 38.5* 54.0*  PLT 236  --   CREATININE  --  1.00    CrCl cannot be calculated (Unknown ideal weight.).   Medical History: Past Medical History  Diagnosis Date  . Arthritis     Rheumatoid  . GERD (gastroesophageal reflux disease)   . Hyperlipidemia   . Hypertension     Dr. [name] [phone]  . Chronic back pain   . Neuromuscular disorder ([place])     "with nerve damage"  . Diabetes mellitus     diet controlled  . Cyst (solitary) of breast     ? cyst left calf ,knee  . Baker's cyst     Left calf    Medications:  See electronic med rec  Assessment: 60 y.o. M presents with NSTEMI. Trop up to 23.3. CBC ok on admission. No AC PTA. To begin heparin for NSTEMI.  Goal of Therapy:  Heparin level 0.3-0.7 units/ml Monitor platelets by anticoagulation protocol: Yes   Plan:  Heparin IV bolus 4000 units Heparin gtt at 1350 units/hr Will f/u heparin level in 6 hours Daily heparin level and CBC  Sherlon Handing, PharmD, BCPS Clinical pharmacist, pager [phone] 07/15/2015,4:47 AM

## 2015-07-15 NOTE — ED Notes (Signed)
Patient transported to CT 

## 2015-07-15 NOTE — H&P (View-Only) (Signed)
     Chest pain is worsening over the last hour despite nitroglycerin drip. Wife present. Has hand over his chest and at times is moaning. Troponin point of care was 23. EKG shows subtle ST elevation in inferior leads T-wave inversion to 3 aVF as well as lateral leads.  Blood pressure 107/77, heart rate 99, creatinine 1.0.  CT angiogram of chest shows no evidence of dissection  Has had chest pain over the last 2 days but has progressively become worse. He did have a back injury took place on Wednesday.  Because of his worsening, progressive chest discomfort, EKG, troponin, we will proceed with urgent cardiac catheterization. I discussed with Dr. Peter Martinique. I have called [facility] to activate Cath Lab.  Risks and benefits of cardiac catheterization have been discussed, stroke, death, renal impairment, bleeding. He is willing to proceed. His wife was present for discussion.   61 year old male with diabetes, hypertension, hyperlipidemia here with significant angina-Non-ST elevation myocardial infarction-worsening symptoms as above. Elevated troponin. Diabetes, hypertension, hyperlipidemia, chronic back pain. Nitroglycerin drip. He is nothing by mouth.  Lungs appear clear, heart regular rate and rhythm. He appears to be an active moderate to severe distress.  We'll add beta blocker post cath.  Critical care time 32 minutes spent with patient, discussion with family/wife, Dr. Peter Martinique of interventional cardiology, review of extensive data elements and bedside care in this gentleman who is critically ill actively having heart attack with worsening symptoms.  Candee Furbish, MD

## 2015-07-15 NOTE — ED Notes (Signed)
RN called Thomas Mcclure sleep study to relay message to his wife who is having a sleep study done, per pt request

## 2015-07-15 NOTE — Progress Notes (Signed)
  Echocardiogram 2D Echocardiogram has been performed.  Johny Chess 07/15/2015, 3:47 PM

## 2015-07-15 NOTE — ED Notes (Signed)
Pt to CT with Anderson Malta RN

## 2015-07-15 NOTE — ED Provider Notes (Signed)
Medical screening examination/treatment/procedure(s) were conducted as a shared visit with non-physician practitioner(s) and myself.  I personally evaluated the patient during the encounter.   EKG Interpretation   Date/Time:  Sunday Jul 15 2015 03:24:13 EDT Ventricular Rate:  102 PR Interval:  176 QRS Duration: 101 QT Interval:  354 QTC Calculation: 461 R Axis:   -17 Text Interpretation:  Sinus tachycardia LVH with secondary repolarization  abnormality Inferior infarct, age indeterminate New T wave inversions in  lateral leads compared to prior Confirmed by Maat Kafer,  DO, Glynn Yepes [phone]) on  07/15/2015 3:30:44 AM         EKG Interpretation  Date/Time:  Sunday Jul 15 2015 04:09:31 EDT Ventricular Rate:  106 PR Interval:  176 QRS Duration: 105 QT Interval:  341 QTC Calculation: 453 R Axis:   -14 Text Interpretation:  Sinus tachycardia LVH with secondary repolarization abnormality Probable inferior infarct,  age inderterminate Baseline wander in lead(s) I III aVL Confirmed by Ashanti Ratti,  DO, Kevia Zaucha [phone]) on 07/15/2015 4:21:25 AM         EKG Interpretation  Date/Time:  Sunday Jul 15 2015 04:49:09 EDT Ventricular Rate:  106 PR Interval:  142 QRS Duration: 99 QT Interval:  339 QTC Calculation: 450 R Axis:   -7 Text Interpretation:  Sinus tachycardia Inferior infarct, recent No significant change since last tracing Confirmed by Anetra Czerwinski,  DO, Trev Boley [id number]) on 07/15/2015 4:55:13 AM       Pt is a 60 y.o. male with history of hypertension, diabetes, hyperlipidemia, previous history of tobacco use who presents to the emergency department with chest pain that started Thursday, May 18 after lifting a lawnmower. He states that he felt a pulling pain in his chest and left arm immediately upon lifting a lawnmower out of a car. Pain is been constant since that time and progressively worsened today. States he was unable to sleep because any movement makes his pain worse. Denies shortness of  breath, diaphoresis, dizziness, nausea or vomiting. Pain was nonexertional.   Patient's EKG shows T-wave inversions in lateral leads that are new compared to prior. Cardiac labs are being ordered because of these EKG changes. His pain does seem very atypical but he does have multiple risk factors for ACS. We'll give Dilaudid for pain and reassess.   Patient's troponin has come back extremely elevated. Repeat EKG again does not show ST elevation but does show some change in the morphology of ST segments in the inferior leads and again the T-wave inversions in lateral leads. Will obtain a CT of patient's chest to rule out dissection given he has had pain going into his back. We'll give morphine, Zofran.   I have reviewed patient's CT scan do not see obvious dissection or aneurysm. Will consult cardiology for patient's NSTEMI.  He is still having chest pain. We'll continue to work aggressively to get him pain-free.  4:45 AM  D/w Dr. Rosanna Randy Who is on-call for cardiology. She agrees based on his previous EKG that this does not meet STEMI criteria. We'll repeat another EKG. We'll give aspirin, nitroglycerin, heparin.  6:00 AM  Pt pain-free. Seen by cardiologist. Will admit to cardiac ICU.  [place], DO 07/15/15 [phone]

## 2015-07-15 NOTE — Progress Notes (Signed)
Utilization review completed.  

## 2015-07-15 NOTE — ED Notes (Signed)
Patient transported to X-ray 

## 2015-07-15 NOTE — H&P (Signed)
History & Physical    Patient ID: Thomas Mcclure MRN: [id number], DOB/AGE: [birth year]  Admit date: 07/15/2015 Primary Physician: [name], MD  CC:  CP  HPI    60 yo M w a h/o DM, HTN, HLD, & chronic back pain for which he is on disability presented today at 1:30 am with almost 48 hours or progressive chest pain, that started out more exertional, lasting 10 minutes at a time & became progressively more constant.  He described the pain as a 10/10, substernal pressure associated with diaphoresis, dyspnea, lightheadedness, & nausea.  He has never had similar pain previously.  He initially attributed this to a back injury that took place on Wednesday when he rolling a lawnmower off of his truck.  His son was supposed to catch it but did not, & this left the brunt of the weight with the patient.  He immediately noted pain between his shoulder blades that radiated to his lower back.  This progressed Thursday, & by Friday morning, he developed the chest pain, which became constant by Saturday morning.  Otherwise, he denied palpitations, lower extremity swelling, orthopnea, or paroxysmal nocturnal dyspnea.  He   On arrival to the ED, his pain responded to different types of treatment.  His back pain & left lower rib pain improved with narcotics.  His substernal chest pressure resolved with NTG SL x 3 followed by a gtt.  EKG revealed NSR with new inferior Q waves & T wave abnormalities compared to prior.  His initial troponin was 23.4.  A CTA of his chest was unremarkable.    Past Medical History   Past Medical History  Diagnosis Date  . Arthritis     Rheumatoid  . GERD (gastroesophageal reflux disease)   . Hyperlipidemia   . Hypertension     Dr. [name] [phone]  . Chronic back pain   . 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 Diabetes mellitus     diet controlled  . Cyst (solitary) of breast     ? cyst left calf ,knee  . Baker's cyst     Left calf    Past  Surgical History  Procedure Laterality Date  . Lumbar spine surgery  03/2009  & 2012  . Wisdom tooth extraction      hx of  . Mass excision N/A 04/19/2012    Procedure: removal of posterior cervical lipoma;  Surgeon: [name], MD;  Location: [place] [facility];  Service: Neurosurgery;  Laterality: N/A;  Removal of posterior cervical lipoma  . Spinal cord stimulator insertion N/A 01/07/2013    Procedure:  SPINAL CORD STIMULATOR INSERTION;  Surgeon: [name], MD;  Location: [place] [facility];  Service: Neurosurgery;  Laterality: N/A;    Allergies  Allergies  Allergen Reactions  . Toradol [Ketorolac Tromethamine] Anaphylaxis    bp dropped, diaphoresis  . Other     Allergic reaction to steroid? Unsure of name  . Adhesive [Tape] Rash and Other (See Comments)    "blisters"   Home Medications    Prior to Admission medications   Medication Sig Start Date End Date Taking? Authorizing Provider  aspirin 81 MG tablet Take 81 mg by mouth daily.    Historical Provider, MD  chlorthalidone (HYGROTON) 25 MG tablet Take 25 mg by mouth daily. 05/31/13   Historical Provider, MD  cyclobenzaprine (FLEXERIL) 10 MG tablet Take 10 mg by mouth 2 (two) times daily. For muscle spasm  Historical Provider, MD  diazepam (VALIUM) 10 MG tablet Take 10 mg by mouth 2 (two) times daily.    Historical Provider, MD  diltiazem (DILACOR XR) 180 MG 24 hr capsule Take 180 mg by mouth daily.    Historical Provider, MD  DULoxetine (CYMBALTA) 60 MG capsule Take 60 mg by mouth daily.    Historical Provider, MD  hydrochlorothiazide (HYDRODIURIL) 25 MG tablet Take 25 mg by mouth daily.    Historical Provider, MD  lisinopril (PRINIVIL,ZESTRIL) 20 MG tablet Take 20 mg by mouth daily.    Historical Provider, MD  meloxicam (MOBIC) 15 MG tablet Take 15 mg by mouth daily.    Historical Provider, MD  metformin (FORTAMET) 500 MG (OSM) 24 hr tablet Take 500 mg by mouth daily. 06/03/13   Historical Provider, MD  mupirocin ointment  (BACTROBAN) 2 % Apply 1 application topically daily. 05/31/13   Historical Provider, MD  omeprazole (PRILOSEC) 40 MG capsule Take 40 mg by mouth daily.    Historical Provider, MD  Oxycodone HCl 10 MG TABS Take 1 tablet (10 mg total) by mouth every 4 (four) hours. 01/07/13   Clydell Hakim, MD  potassium chloride SA (K-DUR,KLOR-CON) 20 MEQ tablet Take 20 mEq by mouth daily.     Historical Provider, MD  pravastatin (PRAVACHOL) 40 MG tablet Take 40 mg by mouth daily.    Historical Provider, MD  pregabalin (LYRICA) 100 MG capsule Take 200 mg by mouth daily.     Historical Provider, MD  topiramate (TOPAMAX) 50 MG tablet Take 50 mg by mouth 2 (two) times daily. For nerve pain    Historical Provider, MD  traMADol (ULTRAM) 50 MG tablet Take 100 mg by mouth 3 (three) times daily as needed.     Historical Provider, MD   Family History    Family History  Problem Relation Age of Onset  . Heart disease Mother   . Prostate cancer Brother    Social History    Social History   Social History  . Marital Status: Married    Spouse Name: N/A  . Number of Children: 5  . Years of Education: N/A   Occupational History  . 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 Smoking status: Former Smoker -- 15 years    Types: Cigarettes  . Smokeless tobacco: Never Used     Comment: 5 cig a week  . Alcohol Use: 0.0 oz/week    0 Standard drinks or equivalent per week     Comment: Couple 40 ounces daily until 2012  . Drug Use: No  . Sexual Activity: Not on file   Other Topics Concern  . Not on file   Social History Narrative   Review of Systems    General:  No chills, fever, night sweats or weight changes.  Cardiovascular:  Positive for chest pain.  No edema, orthopnea, palpitations, paroxysmal nocturnal dyspnea. Dermatological: No rash, lesions/masses Respiratory: Positive for dyspnea.  No cough.   Urologic: No hematuria, dysuria Abdominal:   Positive for  nausea.  No vomiting, diarrhea, bright red blood per rectum, melena, or hematemesis Neurologic:  No visual changes, wkns, changes in mental status. All other systems reviewed and are otherwise negative except as noted above.  Physical Exam    Blood pressure 113/72, pulse 112, temperature 99.7 F (37.6 C), temperature source Oral, resp. rate 25, SpO2 95 %.  General: Pleasant, NAD Psych: Normal affect. Neuro: Alert and oriented  X 3. Moves all extremities spontaneously. HEENT: Normal  Neck: Supple without bruits or JVD. Lungs:  Resp regular and unlabored, CTA. Heart: RRR no s3, s4, or murmurs. Abdomen: Soft, non-tender, non-distended, BS + x 4.  Extremities: No clubbing, cyanosis or edema. DP/PT/Radials 2+ and equal bilaterally.  Labs    Troponin [facility] Test)  Recent Labs  07/15/15 0354  TROPIPOC 23.36*   No results for input(s): CKTOTAL, CKMB, TROPONINI in the last 72 hours. Lab Results  Component Value Date   WBC 16.8* 07/15/2015   HGB 18.4* 07/15/2015   HCT 54.0* 07/15/2015   MCV 82.1 07/15/2015   PLT 236 07/15/2015    Recent Labs Lab 07/15/15 0350 07/15/15 0425  NA 136 137  K 3.3* 3.3*  CL 97* 98*  CO2 26  --   BUN 10 12  CREATININE 1.10 1.00  CALCIUM 9.3  --   PROT 7.1  --   BILITOT 0.8  --   ALKPHOS 61  --   ALT 59  --   AST 160*  --   GLUCOSE 132* 125*   Lab Results  Component Value Date   CHOL 200 08/31/2009   HDL 47 08/31/2009   LDLCALC 129* 08/31/2009   TRIG 122 08/31/2009   No results found for: [facility]   Radiology Studies    Dg Chest 2 View  07/15/2015  CLINICAL DATA:  Chest pain, back pain, and right shoulder pain. Injury while mowing the lawn. EXAM: CHEST  2 VIEW COMPARISON:  04/16/2012 FINDINGS: Linear atelectasis in the lung bases. No focal airspace disease or consolidation. No blunting of costophrenic angles. No pneumothorax. Normal heart size and pulmonary vascularity. Tortuous aorta. Spinal stimulator leads in the mid thoracic  region. IMPRESSION: Shallow inspiration with atelectasis in the lung bases. Electronically Signed   By: Lucienne Capers M.D.   On: 07/15/2015 04:07   Ct Angio Chest/abd/pel For Dissection W And/or W/wo  07/15/2015  CLINICAL DATA:  Chest pain and shortness of breath. Assess for dissection. History of hypertension, hyperlipidemia, chronic back pain, neuromuscular disorder, diabetes. EXAM: CT ANGIOGRAPHY CHEST, ABDOMEN AND PELVIS TECHNIQUE: Multidetector CT imaging through the chest, abdomen and pelvis was performed using the standard protocol during bolus administration of intravenous contrast. Multiplanar reconstructed images and MIPs were obtained and reviewed to evaluate the vascular anatomy. CONTRAST:  100 cc Isovue 370 COMPARISON:  Chest radiograph Jul 15, 2015 at 0320 hours FINDINGS: CTA CHEST FINDINGS ARTERIES: Thoracic aorta is normal course and caliber. Mild calcific atherosclerosis. No intrinsic density on noncontrast CT. Homogeneous contrast opacification of thoracic aorta without dissection, aneurysm, luminal irregularity, periaortic fluid collections, or contrast extravasation. MEDIASTINUM: The heart is mildly enlarged. Moderate coronary artery calcifications. No pericardial fluid collection. LUNGS: Tracheobronchial tree is patent, no pneumothorax. Mild bronchial wall thickening. Mild apical bullous changes. Bibasilar dependent atelectasis. 2 mm sub solid subpleural RIGHT pulmonary nodule is likely benign. No pleural effusions, focal consolidations, pulmonary nodules or masses. SOFT TISSUES AND OSSEOUS STRUCTURES: Nonsuspicious. Spinal stimulator leads via T12-L1 interlaminar approach with lead tips at T6. Review of the MIP images confirms the above findings. Re CTA ABDOMEN AND PELVIS FINDINGS ARTERIES: Abdominal aorta is normal course and caliber. Homogeneous contrast opacification of aortoiliac vessels without dissection, aneurysm, luminal irregularity, periaortic fluid collections, or contrast  extravasation. Moderate calcific atherosclerosis in intimal thickening. Celiac axis, superior and inferior mesenteric arteries are normal. Ectatic LEFT Common iliac artery to 16 mm. Accessory bilateral renal arteries. SOLID ORGANS: The liver, spleen, gallbladder, pancreas are  unremarkable. Nodular thickening of the bilateral adrenal glands most compatible with hyperplasia. GASTROINTESTINAL TRACT: The stomach, small and large bowel are normal in course and caliber without inflammatory changes. Moderate amount of retained large bowel stool. Normal appendix. KIDNEYS/ URINARY TRACT: Kidneys are orthotopic, demonstrating symmetric enhancement. No nephrolithiasis, hydronephrosis or solid renal masses. The unopacified ureters are normal in course and caliber. Urinary bladder is well distended and unremarkable. PERITONEUM/RETROPERITONEUM: No intraperitoneal free fluid nor free air. Aortoiliac vessels are normal in course and caliber. No lymphadenopathy by CT size criteria. Prostate is mildly enlarged. SOFT TISSUE/OSSEOUS STRUCTURES: Nonsuspicious. Moderate RIGHT and small LEFT fat containing inguinal hernias. Battery pack RIGHT gluteus subcutaneous fat. Status post L4 and L5 laminectomies. L3 through S1 pedicle screws and bridging rods with incorporated posterior bone graft material. Mild lucency of the RIGHT S1 screw. Severe LEFT greater than RIGHT L5-S1 neural foraminal narrowing. Non incorporated L5-S1 interbody fusion material. Moderate degenerative change of the RIGHT hip. Bridging LEFT sacroiliac osteophyte. Review of the MIP images confirms the above findings. IMPRESSION: CTA CHEST: No acute vascular process. Mild bronchial wall thickening can be seen with reactive airway disease or bronchitis. Mild cardiomegaly. CTA ABDOMEN AND PELVIS: No acute vascular process or acute intra-abdominal/pelvic process. Status post L3 through S1 PLIF, with findings of L5-S1 hardware failure. Electronically Signed   By: Elon Alas M.D.   On: 07/15/2015 04:58   Assessment & Plan    60 yo M w a h/o DM, HTN, HLD, & chronic back pain for which he is on disability presented today at 1:30 am with almost 48 hours or progressive chest pain, found to have NSTEMI, though his new inferior infarct on his ECG suggests significant myocardial involvement.  # NSTEMI - His chest pain is now resolved with NTG gtt (though his back pain persists). - Will monitor on telemetry in the CICU with serial cardiac enzymes. - Will treat with ASA, Heparin, NTG, & Atorvastatin 80. - Until his NTG gtt is optimized to completely address his pain, will hold off on beta-blocker for now. - NPO in case cardiac catheterization is needed this morning. - Check a HgbA1c & Lipid panel. - Check a TTE while the above is being addressed.  # h/o DM - Transition from Metformin to [place] while in-house.  # h/o HTN - Hold home HCTZ & Lisinopril as NTG gtt is being titrated.    # h/o HLD  - Check fasting Lipid panel, & transition from Pravastatin 40 to Atorvastatin 80.    # h/o Chronic back pain - Have continued home regimen.    Signed, Alfonso Ramus, MD 07/15/2015, 6:22 AM

## 2015-07-15 NOTE — Interval H&P Note (Signed)
History and Physical Interval Note:  07/15/2015 9:26 AM  Thomas Mcclure  has presented today for surgery, with the diagnosis of chest pain  The various methods of treatment have been discussed with the patient and family. After consideration of risks, benefits and other options for treatment, the patient has consented to  Procedure(s): Left Heart Cath and Coronary Angiography (N/A) as a surgical intervention .  The patient's history has been reviewed, patient examined, no change in status, stable for surgery.  I have reviewed the patient's chart and labs.  Questions were answered to the patient's satisfaction.   Cath Lab Visit (complete for each Cath Lab visit)  Clinical Evaluation Leading to the Procedure:   ACS: Yes.    Non-ACS:    Anginal Classification: CCS IV  Anti-ischemic medical therapy: Maximal Therapy (2 or more classes of medications)  Non-Invasive Test Results: No non-invasive testing performed  Prior CABG: No previous CABG        [name] [facility] 07/15/2015 9:27 AM

## 2015-07-15 NOTE — Progress Notes (Signed)
     Chest pain is worsening over the last hour despite nitroglycerin drip. Wife present. Has hand over his chest and at times is moaning. Troponin point of care was 23. EKG shows subtle ST elevation in inferior leads T-wave inversion to 3 aVF as well as lateral leads.  Blood pressure 107/77, heart rate 99, creatinine 1.0.  CT angiogram of chest shows no evidence of dissection  Has had chest pain over the last 2 days but has progressively become worse. He did have a back injury took place on Wednesday.  Because of his worsening, progressive chest discomfort, EKG, troponin, we will proceed with urgent cardiac catheterization. I discussed with Dr. Peter Martinique. I have called [facility] to activate Cath Lab.  Risks and benefits of cardiac catheterization have been discussed, stroke, death, renal impairment, bleeding. He is willing to proceed. His wife was present for discussion.   60 year old male with diabetes, hypertension, hyperlipidemia here with significant angina-Non-ST elevation myocardial infarction-worsening symptoms as above. Elevated troponin. Diabetes, hypertension, hyperlipidemia, chronic back pain. Nitroglycerin drip. He is nothing by mouth.  Lungs appear clear, heart regular rate and rhythm. He appears to be an active moderate to severe distress.  We'll add beta blocker post cath.  Critical care time 32 minutes spent with patient, discussion with family/wife, Dr. Peter Martinique of interventional cardiology, review of extensive data elements and bedside care in this gentleman who is critically ill actively having heart attack with worsening symptoms.  Candee Furbish, MD

## 2015-07-15 NOTE — ED Provider Notes (Signed)
CSN: [id number]     Arrival date & time 07/15/15  0116 History   First MD Initiated Contact with Patient 07/15/15 0255     Chief Complaint  Patient presents with  . Back Pain  . Chest Pain  . Shoulder Pain    (Consider location/radiation/quality/duration/timing/severity/associated sxs/prior Treatment) HPI Comments: Patient presents with complaint of chest pain, back pain, bilateral shoulder pain starting 4 days ago. Patient was helping unload a self-propelled mower off a truck and the person helping him dropped their end of the mower. Patient had initial pain which has gradually worsened since that time. Pain is described as sharp. It has been constant, un-relenting. Radiates to between shoulder blades. Patient has been taking Flexeril at home as well as oxycodone without relief. Pain was so severe tonight, patient had to call EMS for transport to the hospital. He states that it hurts to swallow and take deep breaths. Pain is worse with movement and palpation. He has not had any syncope or lightheadedness. No URI symptoms or reported fevers. The onset of this condition was acute. The course is gradually worsening. Alleviating factors: none.    Patient is a 60 y.o. male presenting with back pain, chest pain, and shoulder pain. The history is provided by the patient and medical records.  Back Pain Associated symptoms: chest pain   Associated symptoms: no fever, no numbness and no weakness   Chest Pain Associated symptoms: back pain   Associated symptoms: no fever, no numbness and no weakness   Shoulder Pain Associated symptoms: back pain   Associated symptoms: no fever     Past Medical History  Diagnosis Date  . Arthritis     Rheumatoid  . GERD (gastroesophageal reflux disease)   . Hyperlipidemia   . Hypertension     Dr. [name] [phone]  . Chronic back pain   . 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 Diabetes mellitus     diet controlled  . Cyst  (solitary) of breast     ? cyst left calf ,knee  . Baker's cyst     Left calf   Past Surgical History  Procedure Laterality Date  . Lumbar spine surgery  03/2009  & 2012  . Wisdom tooth extraction      hx of  . Mass excision N/A 04/19/2012    Procedure: removal of posterior cervical lipoma;  Surgeon: [name], MD;  Location: [facility];  Service: Neurosurgery;  Laterality: N/A;  Removal of posterior cervical lipoma  . Spinal cord stimulator insertion N/A 01/07/2013    Procedure:  SPINAL CORD STIMULATOR INSERTION;  Surgeon: [name], MD;  Location: [facility];  Service: Neurosurgery;  Laterality: N/A;   Family History  Problem Relation Age of Onset  . Heart disease Mother   . Prostate cancer Brother    Social History  Substance Use Topics  . Smoking status: Former Smoker -- 15 years    Types: Cigarettes  . Smokeless tobacco: Never Used     Comment: 5 cig a week  . Alcohol Use: 0.6 oz/week    1 Cans of beer per week    Review of Systems  Constitutional: Negative for fever and unexpected weight change.  Cardiovascular: Positive for chest pain.  Gastrointestinal: Negative for constipation.       Neg for fecal incontinence  Genitourinary: Negative for hematuria, flank pain and difficulty urinating.       Negative for urinary incontinence or  retention  Musculoskeletal: Positive for myalgias, back pain and arthralgias.  Neurological: Negative for weakness and numbness.       Negative for saddle paresthesias     Allergies  Toradol; Other; and Adhesive  Home Medications   Prior to Admission medications   Medication Sig Start Date End Date Taking? Authorizing Provider  aspirin 81 MG tablet Take 81 mg by mouth daily.    Historical Provider, MD  chlorthalidone (HYGROTON) 25 MG tablet Take 25 mg by mouth daily. 05/31/13   Historical Provider, MD  cyclobenzaprine (FLEXERIL) 10 MG tablet Take 10 mg by mouth 2 (two) times daily. For muscle spasm    Historical  Provider, MD  diazepam (VALIUM) 10 MG tablet Take 10 mg by mouth 2 (two) times daily.    Historical Provider, MD  diltiazem (DILACOR XR) 180 MG 24 hr capsule Take 180 mg by mouth daily.    Historical Provider, MD  DULoxetine (CYMBALTA) 60 MG capsule Take 60 mg by mouth daily.    Historical Provider, MD  hydrochlorothiazide (HYDRODIURIL) 25 MG tablet Take 25 mg by mouth daily.    Historical Provider, MD  lisinopril (PRINIVIL,ZESTRIL) 20 MG tablet Take 20 mg by mouth daily.    Historical Provider, MD  meloxicam (MOBIC) 15 MG tablet Take 15 mg by mouth daily.    Historical Provider, MD  metformin (FORTAMET) 500 MG (OSM) 24 hr tablet Take 500 mg by mouth daily. 06/03/13   Historical Provider, MD  mupirocin ointment (BACTROBAN) 2 % Apply 1 application topically daily. 05/31/13   Historical Provider, MD  omeprazole (PRILOSEC) 40 MG capsule Take 40 mg by mouth daily.    Historical Provider, MD  Oxycodone HCl 10 MG TABS Take 1 tablet (10 mg total) by mouth every 4 (four) hours. 01/07/13   Clydell Hakim, MD  potassium chloride SA (K-DUR,KLOR-CON) 20 MEQ tablet Take 20 mEq by mouth daily.     Historical Provider, MD  pravastatin (PRAVACHOL) 40 MG tablet Take 40 mg by mouth daily.    Historical Provider, MD  pregabalin (LYRICA) 100 MG capsule Take 200 mg by mouth daily.     Historical Provider, MD  topiramate (TOPAMAX) 50 MG tablet Take 50 mg by mouth 2 (two) times daily. For nerve pain    Historical Provider, MD  traMADol (ULTRAM) 50 MG tablet Take 100 mg by mouth 3 (three) times daily as needed.     Historical Provider, MD   BP 129/93 mmHg  Pulse 95  Temp(Src) 99.7 F (37.6 C) (Oral)  Resp 25  SpO2 95% Physical Exam  Constitutional: He appears well-developed and well-nourished. He appears distressed (appears uncomfortable).  HENT:  Head: Normocephalic and atraumatic.  Mouth/Throat: Oropharynx is clear and moist.  Eyes: Conjunctivae are normal. Right eye exhibits no discharge. Left eye exhibits no  discharge.  Neck: Normal range of motion. Neck supple.  Cardiovascular: Normal rate, regular rhythm and normal heart sounds.   No murmur heard. Symmetric radial pulses  Pulmonary/Chest: Effort normal and breath sounds normal. He exhibits tenderness.  Abdominal: Soft. There is no tenderness (mild generalized tenderness). There is no rebound and no guarding.  No palpable pulsatile mass.  Musculoskeletal: He exhibits edema and tenderness.  Tenderness over upper chest and bilateral upper back.   Neurological: He is alert.  Skin: Skin is warm and dry.  Psychiatric: He has a normal mood and affect.  Nursing note and vitals reviewed.   ED Course  Procedures (including critical care time) Labs Review Labs Reviewed  CBC WITH DIFFERENTIAL/PLATELET - Abnormal; Notable for the following:    WBC 16.8 (*)    Hemoglobin 12.8 (*)    HCT 38.5 (*)    Neutro Abs 12.6 (*)    Monocytes Absolute 2.4 (*)    All other components within normal limits  COMPREHENSIVE METABOLIC PANEL - Abnormal; Notable for the following:    Potassium 3.3 (*)    Chloride 97 (*)    Glucose, Bld 132 (*)    AST 160 (*)    All other components within normal limits  I-STAT TROPOININ, ED - Abnormal; Notable for the following:    Troponin i, poc 23.36 (*)    All other components within normal limits  I-STAT CHEM 8, ED - Abnormal; Notable for the following:    Potassium 3.3 (*)    Chloride 98 (*)    Glucose, Bld 125 (*)    Calcium, Ion 1.05 (*)    Hemoglobin 18.4 (*)    HCT 54.0 (*)    All other components within normal limits  HEPARIN LEVEL (UNFRACTIONATED)    Imaging Review Dg Chest 2 View  07/15/2015  CLINICAL DATA:  Chest pain, back pain, and right shoulder pain. Injury while mowing the lawn. EXAM: CHEST  2 VIEW COMPARISON:  04/16/2012 FINDINGS: Linear atelectasis in the lung bases. No focal airspace disease or consolidation. No blunting of costophrenic angles. No pneumothorax. Normal heart size and pulmonary  vascularity. Tortuous aorta. Spinal stimulator leads in the mid thoracic region. IMPRESSION: Shallow inspiration with atelectasis in the lung bases. Electronically Signed   By: Lucienne Capers M.D.   On: 07/15/2015 04:07   Ct Angio Chest/abd/pel For Dissection W And/or W/wo  07/15/2015  CLINICAL DATA:  Chest pain and shortness of breath. Assess for dissection. History of hypertension, hyperlipidemia, chronic back pain, neuromuscular disorder, diabetes. EXAM: CT ANGIOGRAPHY CHEST, ABDOMEN AND PELVIS TECHNIQUE: Multidetector CT imaging through the chest, abdomen and pelvis was performed using the standard protocol during bolus administration of intravenous contrast. Multiplanar reconstructed images and MIPs were obtained and reviewed to evaluate the vascular anatomy. CONTRAST:  100 cc Isovue 370 COMPARISON:  Chest radiograph Jul 15, 2015 at 0320 hours FINDINGS: CTA CHEST FINDINGS ARTERIES: Thoracic aorta is normal course and caliber. Mild calcific atherosclerosis. No intrinsic density on noncontrast CT. Homogeneous contrast opacification of thoracic aorta without dissection, aneurysm, luminal irregularity, periaortic fluid collections, or contrast extravasation. MEDIASTINUM: The heart is mildly enlarged. Moderate coronary artery calcifications. No pericardial fluid collection. LUNGS: Tracheobronchial tree is patent, no pneumothorax. Mild bronchial wall thickening. Mild apical bullous changes. Bibasilar dependent atelectasis. 2 mm sub solid subpleural RIGHT pulmonary nodule is likely benign. No pleural effusions, focal consolidations, pulmonary nodules or masses. SOFT TISSUES AND OSSEOUS STRUCTURES: Nonsuspicious. Spinal stimulator leads via T12-L1 interlaminar approach with lead tips at T6. Review of the MIP images confirms the above findings. Re CTA ABDOMEN AND PELVIS FINDINGS ARTERIES: Abdominal aorta is normal course and caliber. Homogeneous contrast opacification of aortoiliac vessels without dissection,  aneurysm, luminal irregularity, periaortic fluid collections, or contrast extravasation. Moderate calcific atherosclerosis in intimal thickening. Celiac axis, superior and inferior mesenteric arteries are normal. Ectatic LEFT Common iliac artery to 16 mm. Accessory bilateral renal arteries. SOLID ORGANS: The liver, spleen, gallbladder, pancreas are unremarkable. Nodular thickening of the bilateral adrenal glands most compatible with hyperplasia. GASTROINTESTINAL TRACT: The stomach, small and large bowel are normal in course and caliber without inflammatory changes. Moderate amount of retained large bowel stool. Normal appendix. KIDNEYS/ URINARY TRACT: Kidneys are  orthotopic, demonstrating symmetric enhancement. No nephrolithiasis, hydronephrosis or solid renal masses. The unopacified ureters are normal in course and caliber. Urinary bladder is well distended and unremarkable. PERITONEUM/RETROPERITONEUM: No intraperitoneal free fluid nor free air. Aortoiliac vessels are normal in course and caliber. No lymphadenopathy by CT size criteria. Prostate is mildly enlarged. SOFT TISSUE/OSSEOUS STRUCTURES: Nonsuspicious. Moderate RIGHT and small LEFT fat containing inguinal hernias. Battery pack RIGHT gluteus subcutaneous fat. Status post L4 and L5 laminectomies. L3 through S1 pedicle screws and bridging rods with incorporated posterior bone graft material. Mild lucency of the RIGHT S1 screw. Severe LEFT greater than RIGHT L5-S1 neural foraminal narrowing. Non incorporated L5-S1 interbody fusion material. Moderate degenerative change of the RIGHT hip. Bridging LEFT sacroiliac osteophyte. Review of the MIP images confirms the above findings. IMPRESSION: CTA CHEST: No acute vascular process. Mild bronchial wall thickening can be seen with reactive airway disease or bronchitis. Mild cardiomegaly. CTA ABDOMEN AND PELVIS: No acute vascular process or acute intra-abdominal/pelvic process. Status post L3 through S1 PLIF, with  findings of L5-S1 hardware failure. Electronically Signed   By: Elon Alas M.D.   On: 07/15/2015 04:58   I have personally reviewed and evaluated these images and lab results as part of my medical decision-making.   EKG Interpretation   Date/Time:  Sunday Jul 15 2015 04:09:31 EDT Ventricular Rate:  106 PR Interval:  176 QRS Duration: 105 QT Interval:  341 QTC Calculation: 453 R Axis:   -14 Text Interpretation:  Sinus tachycardia LVH with secondary repolarization  abnormality Probable inferior infarct,  age inderterminate Baseline wander  in lead(s) I III aVL Confirmed by WARD,  DO, KRISTEN [id number]) on 07/15/2015  4:21:25 AM       3:10 AM Patient seen and examined. Work-up initiated. Medications ordered. Patient temp is 99.73F orally.   Vital signs reviewed and are as follows: BP 129/93 mmHg  Pulse 95  Temp(Src) 99.7 F (37.6 C) (Oral)  Resp 25  SpO2 95%  4:21 AM Troponin elevated. Dr. Leonides Schanz aware. Given description of symptoms, patient brought immediately to CT #2 for dissection study. Repeat EKG performed as above.   4:41 AM Dr. Leonides Schanz on phone with cardiology. ASA, NTG, and heparin ordered pending neg CT dissection study results.   5:51 AM Cardiology has seen patient. Admitting to CICU.   CRITICAL CARE Performed by: Faustino Congress Total critical care time: 40 minutes Critical care time was exclusive of separately billable procedures and treating other patients. Critical care was necessary to treat or prevent imminent or life-threatening deterioration. Critical care was time spent personally by me on the following activities: development of treatment plan with patient and/or surrogate as well as nursing, discussions with consultants, evaluation of patient's response to treatment, examination of patient, obtaining history from patient or surrogate, ordering and performing treatments and interventions, ordering and review of laboratory studies, ordering and review of  radiographic studies, pulse oximetry and re-evaluation of patient's condition.   MDM   Final diagnoses:  NSTEMI (non-ST elevated myocardial infarction) ([place])   Admit.    Carlisle Cater, PA-C 07/15/15 [phone]

## 2015-07-16 ENCOUNTER — Encounter (HOSPITAL_COMMUNITY): Payer: Self-pay | Admitting: Cardiology

## 2015-07-16 DIAGNOSIS — E876 Hypokalemia: Secondary | ICD-10-CM

## 2015-07-16 DIAGNOSIS — I2510 Atherosclerotic heart disease of native coronary artery without angina pectoris: Secondary | ICD-10-CM

## 2015-07-16 HISTORY — DX: Atherosclerotic heart disease of native coronary artery without angina pectoris: I25.10

## 2015-07-16 LAB — CBC
HCT: 34.4 % — ABNORMAL LOW (ref 39.0–52.0)
Hemoglobin: 11.3 g/dL — ABNORMAL LOW (ref 13.0–17.0)
MCH: 26.9 pg (ref 26.0–34.0)
MCHC: 32.8 g/dL (ref 30.0–36.0)
MCV: 81.9 fL (ref 78.0–100.0)
Platelets: 213 10*3/uL (ref 150–400)
RBC: 4.2 MIL/uL — ABNORMAL LOW (ref 4.22–5.81)
RDW: 15.4 % (ref 11.5–15.5)
WBC: 14 10*3/uL — ABNORMAL HIGH (ref 4.0–10.5)

## 2015-07-16 LAB — GLUCOSE, CAPILLARY
Glucose-Capillary: 134 mg/dL — ABNORMAL HIGH (ref 65–99)
Glucose-Capillary: 73 mg/dL (ref 65–99)
Glucose-Capillary: 104 mg/dL — ABNORMAL HIGH (ref 65–99)
Glucose-Capillary: 113 mg/dL — ABNORMAL HIGH (ref 65–99)

## 2015-07-16 LAB — BASIC METABOLIC PANEL
Anion gap: 9 (ref 5–15)
BUN: 9 mg/dL (ref 6–20)
CO2: 27 mmol/L (ref 22–32)
Calcium: 8.6 mg/dL — ABNORMAL LOW (ref 8.9–10.3)
Chloride: 101 mmol/L (ref 101–111)
Creatinine, Ser: 1.09 mg/dL (ref 0.61–1.24)
GFR calc Af Amer: >60 mL/min (ref >60)
GFR calc non Af Amer: >60 mL/min (ref >60)
Glucose, Bld: 104 mg/dL — ABNORMAL HIGH (ref 65–99)
Potassium: 3.1 mmol/L — ABNORMAL LOW (ref 3.5–5.1)
Sodium: 137 mmol/L (ref 135–145)

## 2015-07-16 LAB — POCT ACTIVATED CLOTTING TIME: Activated Clotting Time: 332 seconds

## 2015-07-16 LAB — HEMOGLOBIN A1C
Hgb A1c MFr Bld: 6.6 % — ABNORMAL HIGH (ref 4.8–5.6)
Mean Plasma Glucose: 143 mg/dL

## 2015-07-16 MED ORDER — POTASSIUM CHLORIDE CRYS ER 20 MEQ PO TBCR
20.0000 meq | EXTENDED_RELEASE_TABLET | Freq: Every day | ORAL | Status: DC
  Administered 2015-07-16 – 2015-07-18 (×3): 20 meq via ORAL
  Filled 2015-07-16 (×3): qty 1

## 2015-07-16 MED ORDER — POTASSIUM CHLORIDE CRYS ER 20 MEQ PO TBCR
40.0000 meq | EXTENDED_RELEASE_TABLET | Freq: Once | ORAL | Status: AC
  Administered 2015-07-16: 40 meq via ORAL
  Filled 2015-07-16: qty 2

## 2015-07-16 MED ORDER — METOPROLOL TARTRATE 12.5 MG HALF TABLET
12.5000 mg | ORAL_TABLET | Freq: Two times a day (BID) | ORAL | Status: DC
  Administered 2015-07-16 – 2015-07-18 (×5): 12.5 mg via ORAL
  Filled 2015-07-16 (×5): qty 1

## 2015-07-16 NOTE — Progress Notes (Signed)
Subjective: Chest pain after moving around this AM  EKG without acute changes.   Objective: Vital signs in last 24 hours: Temp:  [98.2 F (36.8 C)-100.2 F (37.9 C)] 99 F (37.2 C) (05/22 0400) Pulse Rate:  [50-105] 84 (05/22 0600) Resp:  [0-35] 21 (05/22 0600) BP: (88-126)/(61-97) 107/64 mmHg (05/22 0600) SpO2:  [89 %-99 %] 96 % (05/22 0600) Weight:  [224 lb 3.3 oz (101.7 kg)] 224 lb 3.3 oz (101.7 kg) (05/21 0800) Weight change:  Last BM Date:  (PTA) Intake/Output from previous day: -209 05/21 0701 - 05/22 0700 In: 1490.1 [P.O.:400; I.V.:1090.1] Out: 1700 [Urine:1700] Intake/Output this shift:    PE: General:Pleasant affect, NAD Skin:Warm and dry, brisk capillary refill HEENT:normocephalic, sclera clear, mucus membranes moist Heart:S1S2 RRR without murmur, gallup, rub or click- no chest wall pain to palpation. Lungs:clear, ant. without rales, rhonchi, or wheezes [id number], non tender, + BS, do not palpate liver spleen or masses Ext:no lower ext edema, 2+ pedal pulses, 2+ radial pulses Neuro:alert and oriented X 3, MAE, follows commands, + facial symmetry tele SR   EKG:  SR 1st degree AV block PR 212 ms.  LVH  Q waves in II, III, AVF- inf MI  Lab Results:  Recent Labs  07/15/15 0717 07/15/15 1830 07/16/15 0258  WBC 18.1*  --  14.0*  HGB 12.1*  --  11.3*  HCT 37.0*  --  34.4*  PLT 224 220 213   BMET  Recent Labs  07/15/15 0350 07/15/15 0425 07/16/15 0258  NA 136 137 137  K 3.3* 3.3* 3.1*  CL 97* 98* 101  CO2 26  --  27  GLUCOSE 132* 125* 104*  BUN 10 12 9   CREATININE 1.10 1.00 1.09  CALCIUM 9.3  --  8.6*    Recent Labs  07/15/15 1303 07/15/15 1830  TROPONINI 34.97* 37.38*    Lab Results  Component Value Date   CHOL 149 07/15/2015   HDL 37* 07/15/2015   LDLCALC 96 07/15/2015   TRIG 82 07/15/2015   CHOLHDL 4.0 07/15/2015   Lab Results  Component Value Date   HGBA1C 6.8* 04/08/2010     Lab Results  Component Value Date   TSH 0.723 04/08/2010    Hepatic Function Panel  Recent Labs  07/15/15 0350  PROT 7.1  ALBUMIN 3.7  AST 160*  ALT 59  ALKPHOS 61  BILITOT 0.8    Recent Labs  07/15/15 0717  CHOL 149   No results for input(s): PROTIME in the last 72 hours.     Studies/Results: Dg Chest 2 View  07/15/2015  CLINICAL DATA:  Chest pain, back pain, and right shoulder pain. Injury while mowing the lawn. EXAM: CHEST  2 VIEW COMPARISON:  04/16/2012 FINDINGS: Linear atelectasis in the lung bases. No focal airspace disease or consolidation. No blunting of costophrenic angles. No pneumothorax. Normal heart size and pulmonary vascularity. Tortuous aorta. Spinal stimulator leads in the mid thoracic region. IMPRESSION: Shallow inspiration with atelectasis in the lung bases. Electronically Signed   By: Lucienne Capers M.D.   On: 07/15/2015 04:07   Ct Angio Chest/abd/pel For Dissection W And/or W/wo  07/15/2015  CLINICAL DATA:  Chest pain and shortness of breath. Assess for dissection. History of hypertension, hyperlipidemia, chronic back pain, neuromuscular disorder, diabetes. EXAM: CT ANGIOGRAPHY CHEST, ABDOMEN AND PELVIS TECHNIQUE: Multidetector CT imaging through the chest, abdomen and pelvis was performed using the standard protocol during bolus administration of intravenous contrast. Multiplanar reconstructed  images and MIPs were obtained and reviewed to evaluate the vascular anatomy. CONTRAST:  100 cc Isovue 370 COMPARISON:  Chest radiograph Jul 15, 2015 at 0320 hours FINDINGS: CTA CHEST FINDINGS ARTERIES: Thoracic aorta is normal course and caliber. Mild calcific atherosclerosis. No intrinsic density on noncontrast CT. Homogeneous contrast opacification of thoracic aorta without dissection, aneurysm, luminal irregularity, periaortic fluid collections, or contrast extravasation. MEDIASTINUM: The heart is mildly enlarged. Moderate coronary artery calcifications. No pericardial fluid collection. LUNGS:  Tracheobronchial tree is patent, no pneumothorax. Mild bronchial wall thickening. Mild apical bullous changes. Bibasilar dependent atelectasis. 2 mm sub solid subpleural RIGHT pulmonary nodule is likely benign. No pleural effusions, focal consolidations, pulmonary nodules or masses. SOFT TISSUES AND OSSEOUS STRUCTURES: Nonsuspicious. Spinal stimulator leads via T12-L1 interlaminar approach with lead tips at T6. Review of the MIP images confirms the above findings. Re CTA ABDOMEN AND PELVIS FINDINGS ARTERIES: Abdominal aorta is normal course and caliber. Homogeneous contrast opacification of aortoiliac vessels without dissection, aneurysm, luminal irregularity, periaortic fluid collections, or contrast extravasation. Moderate calcific atherosclerosis in intimal thickening. Celiac axis, superior and inferior mesenteric arteries are normal. Ectatic LEFT Common iliac artery to 16 mm. Accessory bilateral renal arteries. SOLID ORGANS: The liver, spleen, gallbladder, pancreas are unremarkable. Nodular thickening of the bilateral adrenal glands most compatible with hyperplasia. GASTROINTESTINAL TRACT: The stomach, small and large bowel are normal in course and caliber without inflammatory changes. Moderate amount of retained large bowel stool. Normal appendix. KIDNEYS/ URINARY TRACT: Kidneys are orthotopic, demonstrating symmetric enhancement. No nephrolithiasis, hydronephrosis or solid renal masses. The unopacified ureters are normal in course and caliber. Urinary bladder is well distended and unremarkable. PERITONEUM/RETROPERITONEUM: No intraperitoneal free fluid nor free air. Aortoiliac vessels are normal in course and caliber. No lymphadenopathy by CT size criteria. Prostate is mildly enlarged. SOFT TISSUE/OSSEOUS STRUCTURES: Nonsuspicious. Moderate RIGHT and small LEFT fat containing inguinal hernias. Battery pack RIGHT gluteus subcutaneous fat. Status post L4 and L5 laminectomies. L3 through S1 pedicle screws and  bridging rods with incorporated posterior bone graft material. Mild lucency of the RIGHT S1 screw. Severe LEFT greater than RIGHT L5-S1 neural foraminal narrowing. Non incorporated L5-S1 interbody fusion material. Moderate degenerative change of the RIGHT hip. Bridging LEFT sacroiliac osteophyte. Review of the MIP images confirms the above findings. IMPRESSION: CTA CHEST: No acute vascular process. Mild bronchial wall thickening can be seen with reactive airway disease or bronchitis. Mild cardiomegaly. CTA ABDOMEN AND PELVIS: No acute vascular process or acute intra-abdominal/pelvic process. Status post L3 through S1 PLIF, with findings of L5-S1 hardware failure. Electronically Signed   By: Elon Alas M.D.   On: 07/15/2015 04:58   CARDIAC CATH: Procedures    Coronary Stent Intervention   Left Heart Cath and Coronary Angiography    Conclusion     Prox LAD to Mid LAD lesion, 20% stenosed.  Prox Cx to Mid Cx lesion, 20% stenosed.  Ost 1st Mrg to 1st Mrg lesion, 30% stenosed.  There is mild left ventricular systolic dysfunction.  Prox RCA lesion, 100% stenosed. Post intervention, there is a 0% residual stenosis.  1. Single vessel occlusive CAD. 2. Mild LV dysfunction 3. Successful PCI of the RCA with aspiration thrombectomy and stenting of the proximal RCA with DES.  Plan: DAPT for one year. Continue Aggrastat for 18 hours.     ECHO: Study Conclusions  - Left ventricle: The cavity size was normal. Wall thickness was  increased in a pattern of mild LVH. Systolic function was normal.  The estimated ejection fraction  was in the range of 50% to 55%.  Wall motion was normal; there were no regional wall motion  abnormalities. The study is not technically sufficient to allow  evaluation of LV diastolic function. - Aortic valve: Valve area (VTI): 4.03 cm^2. Valve area (Vmax):  3.85 cm^2. - Right ventricle: The cavity size was moderately dilated. The RV  free wall is  hypokinetic, with hypercontractility of the RV apex.  Findings consistent with McConnell&'s sign suggestiive of  pulmonary embolism. From literature review this sign can also be  present in RCA infarcts. Systolic function was moderately to  severely reduced. RV TAPSE is 1.0 cm.   Medications: I have reviewed the patient's current medications. Scheduled Meds: . aspirin EC  81 mg Oral Daily  . atorvastatin  80 mg Oral q1800  . DULoxetine  60 mg Oral Daily  . enoxaparin (LOVENOX) injection  40 mg Subcutaneous Q24H  . insulin aspart  0-15 Units Subcutaneous TID WC  . pantoprazole  40 mg Oral Daily  . potassium chloride  20 mEq Oral Daily  . pregabalin  200 mg Oral Daily  . sodium chloride flush  3 mL Intravenous Q12H  . sodium chloride flush  3 mL Intravenous Q12H  . ticagrelor  90 mg Oral BID  . topiramate  50 mg Oral BID   Continuous Infusions:  PRN Meds:.sodium chloride, cyclobenzaprine, diazepam, morphine injection, ondansetron **OR** ondansetron (ZOFRAN) IV, oxyCODONE, sodium chloride flush, traMADol  Assessment/Plan: Principal Problem:   NSTEMI (non-ST elevated myocardial infarction) (HCC) Troponin pk 37.38,  Continues with chest pain, mostly associated with movement, s/p stent- DES  Active Problems:   CAD in native artery, 07/15/15 PCI of RCA with DES- on ASA and Brilinta   Hypertension- currently borderline 90/72 to 107/64 on no BB or ACE due to hypotension ? RV component of MI- previously on several meds- HCTZ, lisinopril 20, dilacor 180   Hyperlipidemia- on lipitor 80   Chronic back pain -some of pain relieved with muscle relaxers   Diabetes mellitus ([place])- previously was on metformin- on SSI with controlled glucose   Hypokalemia- replaced this AM    LOS: 1 day   Time spent with pt. : 15 minutes. Cecilie Kicks  Nurse Practitioner Certified Pager [phone] or after 5pm and on weekends call [phone] 07/16/2015, 7:25 AM  The patient has been seen in conjunction with  Cecilie Kicks, NP. All aspects of care have been considered and discussed. The patient has been personally interviewed, examined, and all clinical data has been reviewed.   60 year old gentleman with late presenting inferior myocardial infarction. Having multiple complaints now including back discomfort, focal chest discomfort, and hyperventilating.  Cardiac markers have plateaued at a troponin less than 40. The initial troponin was greater than 35. He has inferior Q waves on the EKG.  Digital images were reviewed and the patient has rather diffuse three-vessel coronary disease. The right coronary was stented and had brisk flow at the completion of the procedure. Inferior wall is akinetic.  Plan is risk factor modification, dual antiplatelet therapy, and phase 1 followed by phase II cardiac rehabilitation. Patient's history of chronic back pain may interfere with progression.  The patient will be at risk for myocardial rupture given his late presentation and probable completed infarct. Plan to keep in the unit 1 more day.

## 2015-07-16 NOTE — Care Management Note (Signed)
Case Management Note  Patient Details  Name: Thomas Mcclure MRN: [id number] Date of Birth: Jan 29, 1956  Subjective/Objective:     Adm w mi               Action/Plan: lives w wife, pcp dr dean   Expected Discharge Date:                  Expected Discharge Plan:  Home/Self Care  In-House Referral:     Discharge planning Services  CM Consult, Medication Assistance  Post Acute Care Choice:    Choice offered to:     DME Arranged:    DME Agency:     HH Arranged:    [place] Agency:     Status of Service:     Medicare Important Message Given:    Date Medicare IM Given:    Medicare IM give by:    Date Additional Medicare IM Given:    Additional Medicare Important Message give by:     If discussed at [place] of Stay Meetings, dates discussed:    Additional Comments: gave pt 30day free brilinta card. Pt has medicaid so copays should be aroung 3.00 per month  Lacretia Leigh, RN 07/16/2015, 9:27 AM

## 2015-07-16 NOTE — Progress Notes (Signed)
CARDIAC REHAB PHASE I   PRE:  Rate/Rhythm: 96 SR  BP:  Supine: 88/66  Sitting: 90/58  Standing:    SaO2: 96%RA  MODE:  Ambulation: chair and then to bathroom ft   POST:  Rate/Rhythm: 99 SR  BP:  Supine:   Sitting: 104/70  Standing:    SaO2: 96%RA 1030-1115 Pt encouraged to get OOB to recliner as he did not want to get up. Assisted to recliner and he stated he felt very weak.  BP low as documented. Encouraged pt not to get up by himself. He stated he wanted to go to bathroom by himself. Offered to go with him. Pt needed gown changed so that was done and bed stripped as linen wet. Began MI ed with pt and family. Reviewed MI restrictions, stent and purpose of brilinta, and NTG use. Has brilinta card but did not see stent card. Asked RN about stent card. Pt then stated he needed to go to bathroom. Assisted to bathroom and pt weak. He did not want me to stay with him but I insisted. Helped back to recliner. Wanted to go to bed. Encouraged to stay in chair. Some SOB noted when he walked from bathroom.   Graylon Good, RN BSN  07/16/2015 11:10 AM  96 SR

## 2015-07-17 LAB — BASIC METABOLIC PANEL
Anion gap: 9 (ref 5–15)
BUN: 12 mg/dL (ref 6–20)
CO2: 23 mmol/L (ref 22–32)
Calcium: 8.7 mg/dL — ABNORMAL LOW (ref 8.9–10.3)
Chloride: 103 mmol/L (ref 101–111)
Creatinine, Ser: 0.77 mg/dL (ref 0.61–1.24)
GFR calc Af Amer: >60 mL/min (ref >60)
GFR calc non Af Amer: >60 mL/min (ref >60)
Glucose, Bld: 100 mg/dL — ABNORMAL HIGH (ref 65–99)
Potassium: 3.6 mmol/L (ref 3.5–5.1)
Sodium: 135 mmol/L (ref 135–145)

## 2015-07-17 LAB — GLUCOSE, CAPILLARY
Glucose-Capillary: 114 mg/dL — ABNORMAL HIGH (ref 65–99)
Glucose-Capillary: 106 mg/dL — ABNORMAL HIGH (ref 65–99)
Glucose-Capillary: 117 mg/dL — ABNORMAL HIGH (ref 65–99)
Glucose-Capillary: 109 mg/dL — ABNORMAL HIGH (ref 65–99)

## 2015-07-17 LAB — CBC
HCT: 35.6 % — ABNORMAL LOW (ref 39.0–52.0)
Hemoglobin: 11.6 g/dL — ABNORMAL LOW (ref 13.0–17.0)
MCH: 26.7 pg (ref 26.0–34.0)
MCHC: 32.6 g/dL (ref 30.0–36.0)
MCV: 82 fL (ref 78.0–100.0)
Platelets: 193 10*3/uL (ref 150–400)
RBC: 4.34 MIL/uL (ref 4.22–5.81)
RDW: 15.4 % (ref 11.5–15.5)
WBC: 11.8 10*3/uL — ABNORMAL HIGH (ref 4.0–10.5)

## 2015-07-17 LAB — TROPONIN I: Troponin I: 18.03 ng/mL (ref <0.031)

## 2015-07-17 NOTE — Progress Notes (Signed)
INTERVAL HISTORY: 60 yo Mcclure with PMHx of T2DM, HTN, and HLD presented to the ED on 5/21 with exertional, progressive substernal chest pressure associated with diaphoresis and dyspnea that was relieved with nitroglycerin. EKG revealed NSR with new inferior Q waves & T wave abnormalities compared to prior.His initial troponin was 23.4.Patient was admitted to CCU with NSTEMI.   SUBJECTIVE:  Patient was seen and examined this morning. He states he feels better than yesterday, but he continues to have mild exertional chest pain on the inferior anterior portion of his left chest when he walks to the bathroom or bathes himself. He admits to increased shortness of breath with exertion and laying flat on his back. He denies weakness or lightheadedness.   OBJECTIVE:   Vitals:   Filed Vitals:   07/17/15 0500 07/17/15 0600 07/17/15 0700 07/17/15 0800  BP: 88/57 108/68 96/54 103/70  Pulse: 100 79 94 101  Temp:      TempSrc:      Resp: 21 30 19 17   Height:      Weight:      SpO2: 98% 100% 96% 100%   I&O's:    Intake/Output Summary (Last 24 hours) at 07/17/15 0859 Last data filed at 07/17/15 0800  Gross per 24 hour  Intake    366 ml  Output   1225 ml  Net   -859 ml   TELEMETRY: Reviewed telemetry pt in sinus rhythm, HR 90s. No events on telemetry.  PHYSICAL EXAM: General: Vital signs reviewed.  Patient is well-developed and well-nourished, in no acute distress and cooperative with exam.   Cardiovascular: Tachycardic, regular rhythm, S1 normal, S2 normal, no murmurs, gallops, or rubs. Pulmonary/Chest: Clear to auscultation bilaterally, no wheezes, rales, or rhonchi. Abdominal: Soft, non-tender, non-distended, BS + Extremities: No lower extremity edema bilaterally, pulses symmetric and intact bilaterally. Neurological: Awake and alert.  Skin: Warm, dry and intact. Psychiatric: Normal mood and affect. speech and behavior is normal. Cognition and memory are grossly normal.   LABS: Basic  Metabolic Panel:  Recent Labs  07/16/15 0258 07/17/15 0209  NA 137 135  K 3.1* 3.6  CL 101 103  CO2 27 23  GLUCOSE 104* 100*  BUN 9 12  CREATININE 1.09 0.77  CALCIUM 8.6* 8.7*   Liver Function Tests:  Recent Labs  07/15/15 0350  AST 160*  ALT 59  ALKPHOS 61  BILITOT 0.8  PROT 7.1  ALBUMIN 3.7   CBC:  Recent Labs  07/15/15 0350  07/16/15 0258 07/17/15 0209  WBC 16.8*  < > 14.0* 11.8*  NEUTROABS 12.6*  --   --   --   HGB 12.8*  < > 11.3* 11.6*  HCT 38.5*  < > 34.4* 35.6*  MCV 82.1  < > 81.9 82.0  PLT 236  < > 213 193  < > = values in this interval not displayed. Cardiac Enzymes:  Recent Labs  07/15/15 1303 07/15/15 1830 07/17/15 0209  TROPONINI 34.97* 37.38* 18.03*   Hemoglobin A1C:  Recent Labs  07/15/15 0717  HGBA1C 6.6*   Fasting Lipid Panel:  Recent Labs  07/15/15 0717  CHOL 149  HDL 37*  LDLCALC 96  TRIG 82  CHOLHDL 4.0   Coag Panel:   Lab Results  Component Value Date   INR 1.21 07/15/2015   INR 0.89 01/04/2013    RADIOLOGY: Dg Chest 2 View  07/15/2015  CLINICAL DATA:  Chest pain, back pain, and right shoulder pain. Injury while mowing the lawn. EXAM: CHEST  2 VIEW COMPARISON:  04/16/2012 FINDINGS: Linear atelectasis in the lung bases. No focal airspace disease or consolidation. No blunting of costophrenic angles. No pneumothorax. Normal heart size and pulmonary vascularity. Tortuous aorta. Spinal stimulator leads in the mid thoracic region. IMPRESSION: Shallow inspiration with atelectasis in the lung bases. Electronically Signed   By: Lucienne Capers Mcclure.D.   On: 07/15/2015 04:07   Ct Angio Chest/abd/pel For Dissection W And/or W/wo  07/15/2015  CLINICAL DATA:  Chest pain and shortness of breath. Assess for dissection. History of hypertension, hyperlipidemia, chronic back pain, neuromuscular disorder, diabetes. EXAM: CT ANGIOGRAPHY CHEST, ABDOMEN AND PELVIS TECHNIQUE: Multidetector CT imaging through the chest, abdomen and pelvis was  performed using the standard protocol during bolus administration of intravenous contrast. Multiplanar reconstructed images and MIPs were obtained and reviewed to evaluate the vascular anatomy. CONTRAST:  100 cc Isovue 370 COMPARISON:  Chest radiograph Jul 15, 2015 at 0320 hours FINDINGS: CTA CHEST FINDINGS ARTERIES: Thoracic aorta is normal course and caliber. Mild calcific atherosclerosis. No intrinsic density on noncontrast CT. Homogeneous contrast opacification of thoracic aorta without dissection, aneurysm, luminal irregularity, periaortic fluid collections, or contrast extravasation. MEDIASTINUM: The heart is mildly enlarged. Moderate coronary artery calcifications. No pericardial fluid collection. LUNGS: Tracheobronchial tree is patent, no pneumothorax. Mild bronchial wall thickening. Mild apical bullous changes. Bibasilar dependent atelectasis. 2 mm sub solid subpleural RIGHT pulmonary nodule is likely benign. No pleural effusions, focal consolidations, pulmonary nodules or masses. SOFT TISSUES AND OSSEOUS STRUCTURES: Nonsuspicious. Spinal stimulator leads via T12-L1 interlaminar approach with lead tips at T6. Review of the MIP images confirms the above findings. Re CTA ABDOMEN AND PELVIS FINDINGS ARTERIES: Abdominal aorta is normal course and caliber. Homogeneous contrast opacification of aortoiliac vessels without dissection, aneurysm, luminal irregularity, periaortic fluid collections, or contrast extravasation. Moderate calcific atherosclerosis in intimal thickening. Celiac axis, superior and inferior mesenteric arteries are normal. Ectatic LEFT Common iliac artery to 16 mm. Accessory bilateral renal arteries. SOLID ORGANS: The liver, spleen, gallbladder, pancreas are unremarkable. Nodular thickening of the bilateral adrenal glands most compatible with hyperplasia. GASTROINTESTINAL TRACT: The stomach, small and large bowel are normal in course and caliber without inflammatory changes. Moderate amount of  retained large bowel stool. Normal appendix. KIDNEYS/ URINARY TRACT: Kidneys are orthotopic, demonstrating symmetric enhancement. No nephrolithiasis, hydronephrosis or solid renal masses. The unopacified ureters are normal in course and caliber. Urinary bladder is well distended and unremarkable. PERITONEUM/RETROPERITONEUM: No intraperitoneal free fluid nor free air. Aortoiliac vessels are normal in course and caliber. No lymphadenopathy by CT size criteria. Prostate is mildly enlarged. SOFT TISSUE/OSSEOUS STRUCTURES: Nonsuspicious. Moderate RIGHT and small LEFT fat containing inguinal hernias. Battery pack RIGHT gluteus subcutaneous fat. Status post L4 and L5 laminectomies. L3 through S1 pedicle screws and bridging rods with incorporated posterior bone graft material. Mild lucency of the RIGHT S1 screw. Severe LEFT greater than RIGHT L5-S1 neural foraminal narrowing. Non incorporated L5-S1 interbody fusion material. Moderate degenerative change of the RIGHT hip. Bridging LEFT sacroiliac osteophyte. Review of the MIP images confirms the above findings. IMPRESSION: CTA CHEST: No acute vascular process. Mild bronchial wall thickening can be seen with reactive airway disease or bronchitis. Mild cardiomegaly. CTA ABDOMEN AND PELVIS: No acute vascular process or acute intra-abdominal/pelvic process. Status post L3 through S1 PLIF, with findings of L5-S1 hardware failure. Electronically Signed   By: Elon Alas Mcclure.D.   On: 07/15/2015 04:58   LHC 07/15/15:  Prox LAD to Mid LAD lesion, 20% stenosed.  Prox Cx to Mid Cx lesion,  20% stenosed.  Ost 1st Mrg to 1st Mrg lesion, 30% stenosed.  There is mild left ventricular systolic dysfunction.  Prox RCA lesion, 100% stenosed. Post intervention, there is a 0% residual stenosis.  1. Single vessel occlusive CAD. 2. Mild LV dysfunction 3. Successful PCI of the RCA with aspiration thrombectomy and stenting of the proximal RCA with DES.  Echo 07/15/15: Study  Conclusions  - Left ventricle: The cavity size was normal. Wall thickness was  increased in a pattern of mild LVH. Systolic function was normal.  The estimated ejection fraction was in the range of 50% to 55%.  Wall motion was normal; there were no regional wall motion  abnormalities. The study is not technically sufficient to allow  evaluation of LV diastolic function. - Aortic valve: Valve area (VTI): 4.03 cm^2. Valve area (Vmax):  3.85 cm^2. - Right ventricle: The cavity size was moderately dilated. The RV  free wall is hypokinetic, with hypercontractility of the RV apex.  Findings consistent with McConnell&'s sign suggestiive of  pulmonary embolism. From literature review this sign can also be  present in RCA infarcts. Systolic function was moderately to  severely reduced. RV TAPSE is 1.0 cm.  ASSESSMENT/PLAN:   Thomas Mcclure with PMHx of T2DM, HTN, and HLD presented to the ED on 5/21 with exertional, progressive substernal chest pressure and found to have NSTEMI.  NSTEMI: EKG revealed NSR with new inferior Q waves & T wave abnormalities compared to prior.His troponins have trended 30>34>37>18. Patient was initially managed with ASA, Heparin gtt, and Nitroglycerin gtt prior to heart cath. Cardiac cath revealed proximal RCA 100% stenosis, now s/p PCI with aspiration thrombectomy and stenting with DES. Patient is at risk for myocardial rupture given late presentation and probable completed infarct.  -Continue cardiac rehab -ASA 81 mg daily -Brilinta 90 mg BID -Atorvastatin 80 mg daily -Metoprolol 12.5 mg BID  T2DM: A1c 6.6 on 5/21, well controlled. Patient is on Metformin 750 mg daily at home.  -Holding Metformin while inpatient -SSI-Mcclure ACHS  HTN: BP 88/57 to 108/68 this morning. Patient is on chlorthalidone 25 mg daily and diltiazem 180 mg daily at home. We are holding these.  -Metoprolol 12.5 mg BID -Monitor BP on Metoprolol  HLD: Patient was previously on pravastatin 40  mg daily.  -Atorvastatin 80 mg daily  Chronic Back Pain: On disability. Patient's home regimen is flexeril 10 mg TID prn, valium 10 mg BID prn, cymbalta 60 mg daily, mobic 15 mg daily, oxycodone 10 mg Q6H prn, pregabalin 100 mg TID, topiramate 50 mg BID prn and tramadol 100 mg TID prn.  -Continue flexeril, valium, cymbalta, oxycodone, lyrica, topiramate, and tramadol prn  DVT/PE ppx: Lovenox SQ QD FEN: HH/CM  Martyn Malay, DO PGY-2 Internal Medicine Resident Pager # [phone] 07/17/2015 8:59 AM  The patient has been seen in conjunction with [place], DO. All aspects of care have been considered and discussed. The patient has been personally interviewed, examined, and all clinical data has been reviewed.   He has atypical complaints.  Will monitor at least 24 hours longer since he presented late.  Possible AM DC on Thursday.

## 2015-07-17 NOTE — Progress Notes (Signed)
CARDIAC REHAB PHASE I   PRE:  Rate/Rhythm: 98 SR    BP: lying 89/53, standing 86/66    SaO2:   MODE:  Ambulation: 270 ft   POST:  Rate/Rhythm: 114 ST    BP: sitting 98/72     SaO2: 97 RA  Pt initially dizzy and nauseated upon standing. Sat and rested, BP 86/66. Able to walk with gait belt, RW, assist x2 for safety. Fairly steady but leaning heavily on RW. Slow pace. To recliner after walking, sts he feels good now, became very talkative. Left in recliner, BP higher. Will need wife for ed. [place], ACSM 07/17/2015 3:27 PM

## 2015-07-17 NOTE — Progress Notes (Signed)
Report given by Shonna Chock to admitting nurse in 2 [place]. Pt transported to 2 [place] via w/c. With all medications an personal belongs. Pt called wife to let her know what room he was moved to.

## 2015-07-18 ENCOUNTER — Telehealth: Payer: Self-pay | Admitting: *Deleted

## 2015-07-18 DIAGNOSIS — E876 Hypokalemia: Secondary | ICD-10-CM

## 2015-07-18 LAB — BASIC METABOLIC PANEL
Anion gap: 10 (ref 5–15)
BUN: 12 mg/dL (ref 6–20)
CO2: 21 mmol/L — ABNORMAL LOW (ref 22–32)
Calcium: 9.4 mg/dL (ref 8.9–10.3)
Chloride: 106 mmol/L (ref 101–111)
Creatinine, Ser: 1.13 mg/dL (ref 0.61–1.24)
GFR calc Af Amer: >60 mL/min (ref >60)
GFR calc non Af Amer: >60 mL/min (ref >60)
Glucose, Bld: 96 mg/dL (ref 65–99)
Potassium: 4.1 mmol/L (ref 3.5–5.1)
Sodium: 137 mmol/L (ref 135–145)

## 2015-07-18 LAB — CBC
HCT: 35.9 % — ABNORMAL LOW (ref 39.0–52.0)
Hemoglobin: 11.7 g/dL — ABNORMAL LOW (ref 13.0–17.0)
MCH: 26.2 pg (ref 26.0–34.0)
MCHC: 32.6 g/dL (ref 30.0–36.0)
MCV: 80.5 fL (ref 78.0–100.0)
Platelets: 263 10*3/uL (ref 150–400)
RBC: 4.46 MIL/uL (ref 4.22–5.81)
RDW: 15.2 % (ref 11.5–15.5)
WBC: 7.6 10*3/uL (ref 4.0–10.5)

## 2015-07-18 LAB — GLUCOSE, CAPILLARY
Glucose-Capillary: 127 mg/dL — ABNORMAL HIGH (ref 65–99)
Glucose-Capillary: 96 mg/dL (ref 65–99)

## 2015-07-18 MED ORDER — ONDANSETRON HCL 4 MG PO TABS
4.0000 mg | ORAL_TABLET | Freq: Four times a day (QID) | ORAL | Status: DC | PRN
  Administered 2015-07-18: 4 mg via ORAL
  Filled 2015-07-18: qty 1

## 2015-07-18 MED ORDER — METOPROLOL TARTRATE 25 MG PO TABS
12.5000 mg | ORAL_TABLET | Freq: Two times a day (BID) | ORAL | Status: DC
Start: 2015-07-18 — End: 2015-09-07

## 2015-07-18 MED ORDER — NITROGLYCERIN 0.4 MG SL SUBL: 0.4000 mg | SUBLINGUAL_TABLET | SUBLINGUAL | Status: DC | PRN

## 2015-07-18 MED ORDER — ASPIRIN EC 81 MG PO TBEC: 81.0000 mg | DELAYED_RELEASE_TABLET | Freq: Every day | ORAL | Status: DC

## 2015-07-18 MED ORDER — ATORVASTATIN CALCIUM 80 MG PO TABS
80.0000 mg | ORAL_TABLET | Freq: Every day | ORAL | Status: DC
Start: 2015-07-18 — End: 2015-12-25

## 2015-07-18 MED ORDER — TICAGRELOR 90 MG PO TABS
90.0000 mg | ORAL_TABLET | Freq: Two times a day (BID) | ORAL | Status: DC
Start: 2015-07-18 — End: 2015-09-17

## 2015-07-18 NOTE — Progress Notes (Signed)
   Spoke to patient and family. He is doing well. He wants to go home. He denies chest pain.  Currently on high-dose statin therapy, beta blocker therapy, dual antiplatelet therapy, and potassium supplements without diuretic therapy. He was on a thiazide diuretic as an outpatient, but this is been discontinued. Potassium should be discontinued at this time.  Plan discharge today. We'll check be met prior to discharge. Needs clinic follow-up in 7 days. Phase II cardiac rehabilitation is recommended.

## 2015-07-18 NOTE — Telephone Encounter (Signed)
Patients wife called and stated that the patient was just d/c'd from the hospital and he was told that an rx for nitro would be sent to [facility]. The pharmacy does not have it and there is not an order in his chart for this. Please advise. Thanks, MI

## 2015-07-18 NOTE — Telephone Encounter (Signed)
Returned call to patient's wife.She stated NTG was not sent to pharmacy.NTG sent to pharmacy.

## 2015-07-18 NOTE — Discharge Summary (Signed)
Discharge Summary    Patient ID: Thomas Mcclure,  MRN: [id number], DOB/AGE: [birth year] 60 y.o.  Admit date: 07/15/2015 Discharge date: 07/18/2015  Primary Care Provider: [name] Primary Cardiologist: None  Discharge Diagnoses    Principal Problem:   NSTEMI (non-ST elevated myocardial infarction) [facility]) Active Problems:   Hypertension   Hyperlipidemia   Chronic back pain   Diabetes mellitus ([place])   CAD in native artery, 07/15/15 PCI of RCA with DES   Hypokalemia   Allergies Allergies  Allergen Reactions  . Toradol [Ketorolac Tromethamine] Anaphylaxis    bp dropped, diaphoresis  . Other     Allergic reaction to steroid? Unsure of name  . Adhesive [Tape] Rash and Other (See Comments)    "blisters"    Diagnostic Studies/Procedures    TTE 07/15/15 Study Conclusions  - Left ventricle: The cavity size was normal. Wall thickness was  increased in a pattern of mild LVH. Systolic function was normal.  The estimated ejection fraction was in the range of 50% to 55%.  Wall motion was normal; there were no regional wall motion  abnormalities. The study is not technically sufficient to allow  evaluation of LV diastolic function. - Aortic valve: Valve area (VTI): 4.03 cm^2. Valve area (Vmax):  3.85 cm^2. - Right ventricle: The cavity size was moderately dilated. The RV  free wall is hypokinetic, with hypercontractility of the RV apex.  Findings consistent with McConnell&'s sign suggestiive of  pulmonary embolism. From literature review this sign can also be  present in RCA infarcts. Systolic function was moderately to  severely reduced. RV TAPSE is 1.0 cm. _____________  Cardiac Cath 07/15/15  Prox LAD to Mid LAD lesion, 20% stenosed.  Prox Cx to Mid Cx lesion, 20% stenosed.  Ost 1st Mrg to 1st Mrg lesion, 30% stenosed.  There is mild left ventricular systolic dysfunction.  Prox RCA lesion, 100% stenosed. Post intervention, there is a 0% residual  stenosis.  1. Single vessel occlusive CAD. 2. Mild LV dysfunction 3. Successful PCI of the RCA with aspiration thrombectomy and stenting of the proximal RCA with DES.  Plan: DAPT for one year. Continue Aggrastat for 18 hours.   History of Present Illness     60 yo M w a h/o DM, HTN, HLD, & chronic back pain for which he is on disability presented today at 1:30 am with almost 48 hours or progressive chest pain, that started out more exertional, lasting 10 minutes at a time & became progressively more constant. He described the pain as a 10/10, substernal pressure associated with diaphoresis, dyspnea, lightheadedness, & nausea. He has never had similar pain previously. He initially attributed this to a back injury that took place on Wednesday when he rolling a lawnmower off of his truck. His son was supposed to catch it but did not, & this left the brunt of the weight with the patient. He immediately noted pain between his shoulder blades that radiated to his lower back. This progressed Thursday, & by Friday morning, he developed the chest pain, which became constant by Saturday morning. Otherwise, he denied palpitations, lower extremity swelling, orthopnea, or paroxysmal nocturnal dyspnea. He   On arrival to the ED, his pain responded to different types of treatment. His back pain & left lower rib pain improved with narcotics. His substernal chest pressure resolved with NTG SL x 3 followed by a gtt. EKG revealed NSR with new inferior Q waves & T wave abnormalities compared to prior. His initial  troponin was 23.4. A CTA of his chest was unremarkable.   Hospital Course     Consultants: None  60 yo M with PMHx of T2DM, HTN, and HLD presented to the ED on 5/21 with exertional, progressive substernal chest pressure and found to have NSTEMI.  NSTEMI: EKG revealed NSR with new inferior Q waves & T wave abnormalities compared to prior.His troponins trended 30>34>37>18. Patient was  initially managed with ASA, Heparin gtt, and Nitroglycerin gtt prior to heart cath. Cardiac cath revealed proximal RCA 100% stenosis, now s/p PCI with aspiration thrombectomy and stenting with DES. Patient was discharged on ASA 81 mg daily, Brilinta 90 mg BID, Atorvastatin 80 mg daily, Metoprolol 12.5 mg BID.  T2DM: A1c 6.6 on 5/21, well controlled. Patient is on Metformin 750 mg daily at home. Held Metformin while inpatient but resumed on discharge.  HTN: BP soft in the 90-100/60s. Patient is on chlorthalidone 25 mg daily and diltiazem 180 mg daily at home. These were held and patient was started and discharged on Metoprolol 12.5 mg BID.  HLD: Discharged on Atorvastatin 80 mg daily  Chronic Back Pain: On disability. Patient's home regimen is flexeril 10 mg TID prn, valium 10 mg BID prn, cymbalta 60 mg daily, mobic 15 mg daily, oxycodone 10 mg Q6H prn, pregabalin 100 mg TID, topiramate 50 mg BID prn and tramadol 100 mg TID prn. We continued flexeril, valium, cymbalta, oxycodone, lyrica, topiramate, and tramadol prn during admission and discharged patient home on his home medications. _____________  Discharge Vitals Blood pressure 105/62, pulse 100, temperature 98.3 F (36.8 C), temperature source Oral, resp. rate 14, height 5\' 9"  (1.753 m), weight 222 lb 14.2 oz (101.1 kg), SpO2 94 %.  Filed Weights   07/15/15 0800 07/17/15 0350  Weight: 224 lb 3.3 oz (101.7 kg) 222 lb 14.2 oz (101.1 kg)    Labs & Radiologic Studies     CBC  Recent Labs  07/17/15 0209 07/18/15 0330  WBC 11.8* 7.6  HGB 11.6* 11.7*  HCT 35.6* 35.9*  MCV 82.0 80.5  PLT 193 [id number]   Basic Metabolic Panel  Recent Labs  07/17/15 0209 07/18/15 1141  NA 135 137  K 3.6 4.1  CL 103 106  CO2 23 21*  GLUCOSE 100* 96  BUN 12 12  CREATININE 0.77 1.13  CALCIUM 8.7* 9.4   Cardiac Enzymes  Recent Labs  07/15/15 1830 07/17/15 0209  TROPONINI 37.38* 18.03*    Dg Chest 2 View  07/15/2015  CLINICAL DATA:  Chest  pain, back pain, and right shoulder pain. Injury while mowing the lawn. EXAM: CHEST  2 VIEW COMPARISON:  04/16/2012 FINDINGS: Linear atelectasis in the lung bases. No focal airspace disease or consolidation. No blunting of costophrenic angles. No pneumothorax. Normal heart size and pulmonary vascularity. Tortuous aorta. Spinal stimulator leads in the mid thoracic region. IMPRESSION: Shallow inspiration with atelectasis in the lung bases. Electronically Signed   By: Lucienne Capers M.D.   On: 07/15/2015 04:07   Ct Angio Chest/abd/pel For Dissection W And/or W/wo  07/15/2015  CLINICAL DATA:  Chest pain and shortness of breath. Assess for dissection. History of hypertension, hyperlipidemia, chronic back pain, neuromuscular disorder, diabetes. EXAM: CT ANGIOGRAPHY CHEST, ABDOMEN AND PELVIS TECHNIQUE: Multidetector CT imaging through the chest, abdomen and pelvis was performed using the standard protocol during bolus administration of intravenous contrast. Multiplanar reconstructed images and MIPs were obtained and reviewed to evaluate the vascular anatomy. CONTRAST:  100 cc Isovue 370 COMPARISON:  Chest radiograph Jul 15, 2015 at 0320 hours FINDINGS: CTA CHEST FINDINGS ARTERIES: Thoracic aorta is normal course and caliber. Mild calcific atherosclerosis. No intrinsic density on noncontrast CT. Homogeneous contrast opacification of thoracic aorta without dissection, aneurysm, luminal irregularity, periaortic fluid collections, or contrast extravasation. MEDIASTINUM: The heart is mildly enlarged. Moderate coronary artery calcifications. No pericardial fluid collection. LUNGS: Tracheobronchial tree is patent, no pneumothorax. Mild bronchial wall thickening. Mild apical bullous changes. Bibasilar dependent atelectasis. 2 mm sub solid subpleural RIGHT pulmonary nodule is likely benign. No pleural effusions, focal consolidations, pulmonary nodules or masses. SOFT TISSUES AND OSSEOUS STRUCTURES: Nonsuspicious. Spinal  stimulator leads via T12-L1 interlaminar approach with lead tips at T6. Review of the MIP images confirms the above findings. Re CTA ABDOMEN AND PELVIS FINDINGS ARTERIES: Abdominal aorta is normal course and caliber. Homogeneous contrast opacification of aortoiliac vessels without dissection, aneurysm, luminal irregularity, periaortic fluid collections, or contrast extravasation. Moderate calcific atherosclerosis in intimal thickening. Celiac axis, superior and inferior mesenteric arteries are normal. Ectatic LEFT Common iliac artery to 16 mm. Accessory bilateral renal arteries. SOLID ORGANS: The liver, spleen, gallbladder, pancreas are unremarkable. Nodular thickening of the bilateral adrenal glands most compatible with hyperplasia. GASTROINTESTINAL TRACT: The stomach, small and large bowel are normal in course and caliber without inflammatory changes. Moderate amount of retained large bowel stool. Normal appendix. KIDNEYS/ URINARY TRACT: Kidneys are orthotopic, demonstrating symmetric enhancement. No nephrolithiasis, hydronephrosis or solid renal masses. The unopacified ureters are normal in course and caliber. Urinary bladder is well distended and unremarkable. PERITONEUM/RETROPERITONEUM: No intraperitoneal free fluid nor free air. Aortoiliac vessels are normal in course and caliber. No lymphadenopathy by CT size criteria. Prostate is mildly enlarged. SOFT TISSUE/OSSEOUS STRUCTURES: Nonsuspicious. Moderate RIGHT and small LEFT fat containing inguinal hernias. Battery pack RIGHT gluteus subcutaneous fat. Status post L4 and L5 laminectomies. L3 through S1 pedicle screws and bridging rods with incorporated posterior bone graft material. Mild lucency of the RIGHT S1 screw. Severe LEFT greater than RIGHT L5-S1 neural foraminal narrowing. Non incorporated L5-S1 interbody fusion material. Moderate degenerative change of the RIGHT hip. Bridging LEFT sacroiliac osteophyte. Review of the MIP images confirms the above  findings. IMPRESSION: CTA CHEST: No acute vascular process. Mild bronchial wall thickening can be seen with reactive airway disease or bronchitis. Mild cardiomegaly. CTA ABDOMEN AND PELVIS: No acute vascular process or acute intra-abdominal/pelvic process. Status post L3 through S1 PLIF, with findings of L5-S1 hardware failure. Electronically Signed   By: Elon Alas M.D.   On: 07/15/2015 04:58    Disposition   Pt is being discharged home today in good condition.  Follow-up Plans & Appointments   Follow-up Information    Follow up with Richardson Dopp, PA-C On 08/02/2015.   Specialties:  Cardiology, Physician Assistant   Why:  11:45 PM   Contact information:   [id number] [place] [id number] [phone]      Discharge Instructions    AMB Referral to Cardiac Rehabilitation - Phase II    Complete by:  As directed   Diagnosis:  NSTEMI     Amb Referral to Cardiac Rehabilitation    Complete by:  As directed   Diagnosis:   Coronary Stents STEMI PTCA       Diet - low sodium heart healthy    Complete by:  As directed      Increase activity slowly    Complete by:  As directed            Discharge Medications   Current  Discharge Medication List    START taking these medications   Details  atorvastatin (LIPITOR) 80 MG tablet Take 1 tablet (80 mg total) by mouth daily at 6 PM. Qty: 30 tablet, Refills: 1    metoprolol tartrate (LOPRESSOR) 25 MG tablet Take 0.5 tablets (12.5 mg total) by mouth 2 (two) times daily. Qty: 60 tablet, Refills: 1    ticagrelor (BRILINTA) 90 MG TABS tablet Take 1 tablet (90 mg total) by mouth 2 (two) times daily. Qty: 60 tablet, Refills: 1      CONTINUE these medications which have CHANGED   Details  aspirin EC 81 MG tablet Take 1 tablet (81 mg total) by mouth daily. Qty: 30 tablet, Refills: 1      CONTINUE these medications which have NOT CHANGED   Details  cyclobenzaprine (FLEXERIL) 10 MG tablet Take 10 mg by mouth 3  (three) times daily as needed for muscle spasms.     diazepam (VALIUM) 10 MG tablet Take 10 mg by mouth 2 (two) times daily. For spasms    DULoxetine (CYMBALTA) 60 MG capsule Take 60 mg by mouth daily.    meloxicam (MOBIC) 15 MG tablet Take 15 mg by mouth daily.    metFORMIN (GLUCOPHAGE-XR) 750 MG 24 hr tablet Take 750 mg by mouth daily with supper.    omeprazole (PRILOSEC) 40 MG capsule Take 40 mg by mouth daily.    Oxycodone HCl 10 MG TABS Take 1 tablet (10 mg total) by mouth every 4 (four) hours. Qty: 60 tablet, Refills: 0    polyethylene glycol powder (GLYCOLAX/MIRALAX) powder Take 17 g by mouth daily as needed (constipation). Mix in 8 oz water, juice, soda, coffee or tea and drink    pregabalin (LYRICA) 100 MG capsule Take 100 mg by mouth 3 (three) times daily.     topiramate (TOPAMAX) 50 MG tablet Take 50 mg by mouth 2 (two) times daily as needed (nerve pain). For nerve pain    traMADol (ULTRAM) 50 MG tablet Take 100 mg by mouth 3 (three) times daily as needed (pain).       STOP taking these medications     chlorthalidone (HYGROTON) 25 MG tablet      diltiazem (DILACOR XR) 180 MG 24 hr capsule      potassium chloride SA (K-DUR,KLOR-CON) 20 MEQ tablet      pravastatin (PRAVACHOL) 40 MG tablet          Aspirin prescribed at discharge?  Yes High Intensity Statin Prescribed? (Lipitor 40-80mg  or Crestor 20-40mg ): Yes Beta Blocker Prescribed? Yes For EF 45% or less, Was ACEI/ARB Prescribed? No:  ADP Receptor Inhibitor Prescribed? (i.e. Plavix etc.-Includes Medically Managed Patients): Yes For EF <40%, Aldosterone Inhibitor Prescribed? No:  Was EF assessed during THIS hospitalization? Yes Was Cardiac Rehab II ordered? (Included Medically managed Patients): Yes   Outstanding Labs/Studies   None  Duration of Discharge Encounter   Greater than 30 minutes including physician time.  Beacher May, DO PGY-2 Internal Medicine Resident Pager # [phone] 07/18/2015  1:13 PM    The patient has been seen in conjunction with Alexa Burns, D.O.. All aspects of care have been considered and discussed. The patient has been personally interviewed, examined, and all clinical data has been reviewed.   Late presenting inferior myocardial infarction treated with RCA DES because of ongoing discomfort greater than 12 hours after onset. Overall LV function is preserved. There is a mid inferior wall region of akinesis.  No recurrent ischemic quality chest pain after  PCI. No evidence of heart failure.  Blood pressure has been under reasonable control and medication adjustments have been made in view of ischemic heart disease.  He is being discharged today in improved condition. We have encouraged him to enroll in phase II cardiac  Rehabilitation. He will be seen in office follow-up in 7-10 days.  The importance of dual antiplatelet therapy was discussed.

## 2015-07-18 NOTE — Discharge Instructions (Signed)
TAKE ALL MEDICATIONS AS PRESCRIBED.  PLEASE FOLLOW UP WITH SCOTT WEAVER ON 08/02/15 AT 11:45 PM AS LISTED IN THIS PACKET.   Thank you for allowing [place] to be involved in your healthcare while you were hospitalized at [facility].   Please note that there have been changes to your home medications.  --> PLEASE LOOK AT YOUR DISCHARGE MEDICATION LIST FOR DETAILS.   Please call your PCP if you have any questions or concerns, or any difficulty getting any of your medications.  Please return to the ER if you have worsening of your symptoms or new severe symptoms arise.

## 2015-07-18 NOTE — Care Management Important Message (Signed)
Important Message  Patient Details  Name: Thomas Mcclure MRN: [id number] Date of Birth: April 07, 1955   Medicare Important Message Given:  Yes    Loann Quill 07/18/2015, 11:17 AM

## 2015-07-18 NOTE — Progress Notes (Signed)
Patient to D/C home with wife. Walker given to patient. IV removed. Tele monitor removed. CCMD notified. Patient no Chest pain. No SOB. VSS. Education done. Birlinta education done, with teach back method and handout given. Belongings given to wife. Patient to D/C home with wife.   Domingo Dimes RN

## 2015-07-18 NOTE — Progress Notes (Signed)
CARDIAC REHAB PHASE I   PRE:  Rate/Rhythm:     BP: sitting     SaO2:   MODE:  Ambulation: 300 ft   POST:  Rate/Rhythm: 118 ST    BP: sitting 108/67     SaO2:   Pt up walking with wife and RW. Feeling good, no c/o. I walked with him 150 ft without RW, just min assist at arm. He was fairly steady and will be ok with just his cane at home (that is what he is used to PTA). Ed completed with pt and wife. Discussed sugared drinks, maintaining smoking cessation, importance of Brilinta/ASA, ex and CRPII. Voiced understanding and requests his referral be sent to [place]. [id number]   [place], ACSM 07/18/2015 11:39 AM

## 2015-07-18 NOTE — Progress Notes (Signed)
INTERVAL HISTORY: 60 yo M with PMHx of T2DM, HTN, and HLD presented to the ED on 5/21 with exertional, progressive substernal chest pressure associated with diaphoresis and dyspnea that was relieved with nitroglycerin. EKG revealed NSR with new inferior Q waves & T wave abnormalities compared to prior.His initial troponin was 23.4.Patient was admitted to CCU with NSTEMI.   SUBJECTIVE:  Patient was seen and examined this morning. He denies shortness of breath or chest pain.  OBJECTIVE:   Vitals:   Filed Vitals:   07/17/15 2035 07/17/15 2250 07/18/15 0500 07/18/15 0913  BP: 107/63 106/69 99/58 105/62  Pulse: 85 89 93 100  Temp: 97.9 F (36.6 C)  98.3 F (36.8 C)   TempSrc: Oral  Oral   Resp: 16  14   Height:      Weight:      SpO2: 94%  94%    I&O's:    Intake/Output Summary (Last 24 hours) at 07/18/15 1306 Last data filed at 07/18/15 0846  Gross per 24 hour  Intake    960 ml  Output   1565 ml  Net   -605 ml   PHYSICAL EXAM: General: Vital signs reviewed.  Patient is well-developed and well-nourished, in no acute distress and cooperative with exam.  Cardiovascular: Tachycardic, regular rhythm, S1 normal, S2 normal, no murmurs, gallops, or rubs. Pulmonary/Chest: Clear to auscultation bilaterally, no wheezes, rales, or rhonchi. Abdominal: Soft, non-tender, non-distended, BS + Extremities: No lower extremity edema bilaterally, pulses symmetric and intact bilaterally.   LABS: Basic Metabolic Panel:  Recent Labs  07/17/15 0209 07/18/15 1141  NA 135 137  K 3.6 4.1  CL 103 106  CO2 23 21*  GLUCOSE 100* 96  BUN 12 12  CREATININE 0.77 1.13  CALCIUM 8.7* 9.4   CBC:  Recent Labs  07/17/15 0209 07/18/15 0330  WBC 11.8* 7.6  HGB 11.6* 11.7*  HCT 35.6* 35.9*  MCV 82.0 80.5  PLT 193 263   Cardiac Enzymes:  Recent Labs  07/15/15 1830 07/17/15 0209  TROPONINI 37.38* 18.03*   Coag Panel:   Lab Results  Component Value Date   INR 1.21 07/15/2015   INR 0.89  01/04/2013    RADIOLOGY: Dg Chest 2 View  07/15/2015  CLINICAL DATA:  Chest pain, back pain, and right shoulder pain. Injury while mowing the lawn. EXAM: CHEST  2 VIEW COMPARISON:  04/16/2012 FINDINGS: Linear atelectasis in the lung bases. No focal airspace disease or consolidation. No blunting of costophrenic angles. No pneumothorax. Normal heart size and pulmonary vascularity. Tortuous aorta. Spinal stimulator leads in the mid thoracic region. IMPRESSION: Shallow inspiration with atelectasis in the lung bases. Electronically Signed   By: Lucienne Capers M.D.   On: 07/15/2015 04:07   Ct Angio Chest/abd/pel For Dissection W And/or W/wo  07/15/2015  CLINICAL DATA:  Chest pain and shortness of breath. Assess for dissection. History of hypertension, hyperlipidemia, chronic back pain, neuromuscular disorder, diabetes. EXAM: CT ANGIOGRAPHY CHEST, ABDOMEN AND PELVIS TECHNIQUE: Multidetector CT imaging through the chest, abdomen and pelvis was performed using the standard protocol during bolus administration of intravenous contrast. Multiplanar reconstructed images and MIPs were obtained and reviewed to evaluate the vascular anatomy. CONTRAST:  100 cc Isovue 370 COMPARISON:  Chest radiograph Jul 15, 2015 at 0320 hours FINDINGS: CTA CHEST FINDINGS ARTERIES: Thoracic aorta is normal course and caliber. Mild calcific atherosclerosis. No intrinsic density on noncontrast CT. Homogeneous contrast opacification of thoracic aorta without dissection, aneurysm, luminal irregularity, periaortic fluid collections, or contrast  extravasation. MEDIASTINUM: The heart is mildly enlarged. Moderate coronary artery calcifications. No pericardial fluid collection. LUNGS: Tracheobronchial tree is patent, no pneumothorax. Mild bronchial wall thickening. Mild apical bullous changes. Bibasilar dependent atelectasis. 2 mm sub solid subpleural RIGHT pulmonary nodule is likely benign. No pleural effusions, focal consolidations, pulmonary  nodules or masses. SOFT TISSUES AND OSSEOUS STRUCTURES: Nonsuspicious. Spinal stimulator leads via T12-L1 interlaminar approach with lead tips at T6. Review of the MIP images confirms the above findings. Re CTA ABDOMEN AND PELVIS FINDINGS ARTERIES: Abdominal aorta is normal course and caliber. Homogeneous contrast opacification of aortoiliac vessels without dissection, aneurysm, luminal irregularity, periaortic fluid collections, or contrast extravasation. Moderate calcific atherosclerosis in intimal thickening. Celiac axis, superior and inferior mesenteric arteries are normal. Ectatic LEFT Common iliac artery to 16 mm. Accessory bilateral renal arteries. SOLID ORGANS: The liver, spleen, gallbladder, pancreas are unremarkable. Nodular thickening of the bilateral adrenal glands most compatible with hyperplasia. GASTROINTESTINAL TRACT: The stomach, small and large bowel are normal in course and caliber without inflammatory changes. Moderate amount of retained large bowel stool. Normal appendix. KIDNEYS/ URINARY TRACT: Kidneys are orthotopic, demonstrating symmetric enhancement. No nephrolithiasis, hydronephrosis or solid renal masses. The unopacified ureters are normal in course and caliber. Urinary bladder is well distended and unremarkable. PERITONEUM/RETROPERITONEUM: No intraperitoneal free fluid nor free air. Aortoiliac vessels are normal in course and caliber. No lymphadenopathy by CT size criteria. Prostate is mildly enlarged. SOFT TISSUE/OSSEOUS STRUCTURES: Nonsuspicious. Moderate RIGHT and small LEFT fat containing inguinal hernias. Battery pack RIGHT gluteus subcutaneous fat. Status post L4 and L5 laminectomies. L3 through S1 pedicle screws and bridging rods with incorporated posterior bone graft material. Mild lucency of the RIGHT S1 screw. Severe LEFT greater than RIGHT L5-S1 neural foraminal narrowing. Non incorporated L5-S1 interbody fusion material. Moderate degenerative change of the RIGHT hip. Bridging  LEFT sacroiliac osteophyte. Review of the MIP images confirms the above findings. IMPRESSION: CTA CHEST: No acute vascular process. Mild bronchial wall thickening can be seen with reactive airway disease or bronchitis. Mild cardiomegaly. CTA ABDOMEN AND PELVIS: No acute vascular process or acute intra-abdominal/pelvic process. Status post L3 through S1 PLIF, with findings of L5-S1 hardware failure. Electronically Signed   By: Elon Alas M.D.   On: 07/15/2015 04:58   LHC 07/15/15:  Prox LAD to Mid LAD lesion, 20% stenosed.  Prox Cx to Mid Cx lesion, 20% stenosed.  Ost 1st Mrg to 1st Mrg lesion, 30% stenosed.  There is mild left ventricular systolic dysfunction.  Prox RCA lesion, 100% stenosed. Post intervention, there is a 0% residual stenosis.  1. Single vessel occlusive CAD. 2. Mild LV dysfunction 3. Successful PCI of the RCA with aspiration thrombectomy and stenting of the proximal RCA with DES.  Echo 07/15/15: Study Conclusions  - Left ventricle: The cavity size was normal. Wall thickness was  increased in a pattern of mild LVH. Systolic function was normal.  The estimated ejection fraction was in the range of 50% to 55%.  Wall motion was normal; there were no regional wall motion  abnormalities. The study is not technically sufficient to allow  evaluation of LV diastolic function. - Aortic valve: Valve area (VTI): 4.03 cm^2. Valve area (Vmax):  3.85 cm^2. - Right ventricle: The cavity size was moderately dilated. The RV  free wall is hypokinetic, with hypercontractility of the RV apex.  Findings consistent with McConnell&'s sign suggestiive of  pulmonary embolism. From literature review this sign can also be  present in RCA infarcts. Systolic function was moderately to  severely reduced. RV TAPSE is 1.0 cm.  ASSESSMENT/PLAN:   60 yo M with PMHx of T2DM, HTN, and HLD presented to the ED on 5/21 with exertional, progressive substernal chest pressure and found  to have NSTEMI.  NSTEMI: Cardiac cath revealed proximal RCA 100% stenosis, now s/p PCI with aspiration thrombectomy and stenting with DES. Patient has done well and is ready for discharge today. -Continue cardiac rehab -ASA 81 mg daily -Brilinta 90 mg BID -Atorvastatin 80 mg daily -Metoprolol 12.5 mg BID  T2DM: A1c 6.6 on 5/21, well controlled. Patient is on Metformin 750 mg daily at home.  -Holding Metformin while inpatient -SSI-M ACHS  HTN: Normotensive. Patient is on chlorthalidone 25 mg daily and diltiazem 180 mg daily at home. We are holding these.  -Metoprolol 12.5 mg BID  HLD: Patient was previously on pravastatin 40 mg daily.  -Atorvastatin 80 mg daily  Chronic Back Pain: On disability. Patient's home regimen is flexeril 10 mg TID prn, valium 10 mg BID prn, cymbalta 60 mg daily, mobic 15 mg daily, oxycodone 10 mg Q6H prn, pregabalin 100 mg TID, topiramate 50 mg BID prn and tramadol 100 mg TID prn.  -Continue flexeril, valium, cymbalta, oxycodone, lyrica, topiramate, and tramadol prn  DVT/PE ppx: Lovenox SQ QD FEN: HH/CM  Martyn Malay, DO PGY-2 Internal Medicine Resident Pager # [phone] 07/18/2015 1:06 PM  The patient has been seen in conjunction with [place], D.O.. All aspects of care have been considered and discussed. The patient has been personally interviewed, examined, and all clinical data has been reviewed.   Please refer to the discharge summary.

## 2015-07-18 NOTE — Care Management Note (Signed)
Case Case Management Note Previous CM note initiated by Donne Anon RN, CM  Patient Details  Name: Thomas Mcclure MRN: [id number] Date of Birth: 1955/04/28  Subjective/Objective:     Adm w mi               Action/Plan: lives w wife, pcp dr dean   Expected Discharge Date:    07/18/15              Expected Discharge Plan:  Home/Self Care  In-House Referral:     Discharge planning Services  CM Consult, Medication Assistance  Post Acute Care Choice:  Durable Medical Equipment Choice offered to:     DME Arranged:  [facility] rolling DME Agency:  [place]:    [facility] Agency:     Status of Service:  Completed, signed off  Medicare Important Message Given:  Yes Date Medicare IM Given:    Medicare IM give by:    Date Additional Medicare IM Given:    Additional Medicare Important Message give by:     If discussed at [place] of Stay Meetings, dates discussed:    Additional Comments: gave pt 30day free brilinta card. Pt has medicaid so copays should be aroung 3.00 per month  07/18/15- Rhen Kawecki RN, BSN- pt will need RW for home- spoke with Preston with [facility]- RW to be delivered to room prior to discharge.   Dawayne Patricia, RN 07/18/2015, 1:26 PM

## 2015-07-26 HISTORY — PX: CORONARY ANGIOPLASTY WITH STENT PLACEMENT: SHX49

## 2015-08-02 ENCOUNTER — Ambulatory Visit (INDEPENDENT_AMBULATORY_CARE_PROVIDER_SITE_OTHER): Payer: Medicare Other | Admitting: Physician Assistant

## 2015-08-02 ENCOUNTER — Encounter: Payer: Self-pay | Admitting: Physician Assistant

## 2015-08-02 VITALS — BP 140/80 | HR 62 | Ht 69.0 in | Wt 222.8 lb

## 2015-08-02 DIAGNOSIS — E785 Hyperlipidemia, unspecified: Secondary | ICD-10-CM | POA: Diagnosis not present

## 2015-08-02 DIAGNOSIS — I214 Non-ST elevation (NSTEMI) myocardial infarction: Secondary | ICD-10-CM

## 2015-08-02 DIAGNOSIS — I2510 Atherosclerotic heart disease of native coronary artery without angina pectoris: Secondary | ICD-10-CM | POA: Diagnosis not present

## 2015-08-02 DIAGNOSIS — I10 Essential (primary) hypertension: Secondary | ICD-10-CM

## 2015-08-02 DIAGNOSIS — E1159 Type 2 diabetes mellitus with other circulatory complications: Secondary | ICD-10-CM

## 2015-08-02 MED ORDER — LISINOPRIL 5 MG PO TABS: 5.0000 mg | ORAL_TABLET | Freq: Every day | ORAL | Status: DC

## 2015-08-02 NOTE — Progress Notes (Signed)
Cardiology Office Note:    Date:  08/02/2015   ID:  Thomas Mcclure, DOB 05/06/1955, MRN [id number]  PCP:  [name], MD  Cardiologist:  Dr. Daneen Schick   Electrophysiologist:  n/a  Referring MD: [name], MD   Chief Complaint  Patient presents with  . Hospitalization Follow-up    s/p NSTEMI    History of Present Illness:     Thomas Mcclure is a 60 y.o. male with a hx of diabetes, HTN, HL, chronic back pain.  Admitted 5/21-5/24 with a non-STEMI.  LHC demonstrated an occluded proximal RCA which was treated with aspiration thrombectomy and DES.  Echocardiogram demonstrated mild LVH with normal LV function and normal wall motion, moderate RVE and hypocontractility of the RV apex.    Here for follow-up. Here with his wife. Doing well since discharge. No further chest pain. Denies dyspnea. Denies orthopnea, PND or edema. He occ has some dyspnea when he lays down in bed. Denies syncope. Bleeding issues.   Past Medical History  Diagnosis Date  . Arthritis     Rheumatoid  . GERD (gastroesophageal reflux disease)   . Hyperlipidemia   . Hypertension     Dr. [name] [phone]  . Chronic back pain   . 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 Diabetes mellitus     diet controlled  . Cyst (solitary) of breast     ? cyst left calf ,knee  . Baker's cyst     Left calf  . CAD in native artery, 07/15/15 PCI of RCA with DES 07/16/2015    A.   NSTEMI 5/17: LHC - pLAD 20, pLCx 20, OM1 30, pRCA 100, EF 45-50%>> PCI: 2.5 x 24 mm Promus DES to RCA  //  b.   Echo 5/17: mild LVH, EF 50-55%, no RWMA, mod RVE     Past Surgical History  Procedure Laterality Date  . Lumbar spine surgery  03/2009  & 2012  . Wisdom tooth extraction      hx of  . Mass excision N/A 04/19/2012    Procedure: removal of posterior cervical lipoma;  Surgeon: [name], MD;  Location: [facility];  Service: Neurosurgery;  Laterality: N/A;  Removal of posterior cervical lipoma  . Spinal  cord stimulator insertion N/A 01/07/2013    Procedure:  SPINAL CORD STIMULATOR INSERTION;  Surgeon: [name], MD;  Location: [facility];  Service: Neurosurgery;  Laterality: N/A;  . Cardiac catheterization N/A 07/15/2015    Procedure: Left Heart Cath and Coronary Angiography;  Surgeon: [name], MD;  Location: [facility] CV LAB;  Service: Cardiovascular;  Laterality: N/A;  . Cardiac catheterization N/A 07/15/2015    Procedure: Coronary Stent Intervention;  Surgeon: [name], MD;  Location: [facility] LAB;  Service: Cardiovascular;  Laterality: N/A;    Current Medications: Outpatient Prescriptions Prior to Visit  Medication Sig Dispense Refill  . aspirin EC 81 MG tablet Take 1 tablet (81 mg total) by mouth daily. 30 tablet 1  . atorvastatin (LIPITOR) 80 MG tablet Take 1 tablet (80 mg total) by mouth daily at 6 PM. 30 tablet 1  . cyclobenzaprine (FLEXERIL) 10 MG tablet Take 10 mg by mouth 3 (three) times daily as needed for muscle spasms.     . diazepam (VALIUM) 10 MG tablet Take 10 mg by mouth 2 (two) times daily. For spasms    . DULoxetine (CYMBALTA) 60 MG capsule  Take 60 mg by mouth daily.    . meloxicam (MOBIC) 15 MG tablet Take 15 mg by mouth daily.    . metFORMIN (GLUCOPHAGE-XR) 750 MG 24 hr tablet Take 750 mg by mouth daily with supper.    . metoprolol tartrate (LOPRESSOR) 25 MG tablet Take 0.5 tablets (12.5 mg total) by mouth 2 (two) times daily. 60 tablet 1  . nitroGLYCERIN (NITROSTAT) 0.4 MG SL tablet Place 1 tablet (0.4 mg total) under the tongue every 5 (five) minutes as needed for chest pain. 25 tablet 6  . omeprazole (PRILOSEC) 40 MG capsule Take 40 mg by mouth daily.    . Oxycodone HCl 10 MG TABS Take 1 tablet (10 mg total) by mouth every 4 (four) hours. (Patient taking differently: Take 10 mg by mouth every 6 (six) hours. ) 60 tablet 0  . polyethylene glycol powder (GLYCOLAX/MIRALAX) powder Take 17 g by mouth daily as needed (constipation). Mix in 8 oz  water, juice, soda, coffee or tea and drink    . pregabalin (LYRICA) 100 MG capsule Take 100 mg by mouth 3 (three) times daily.     . ticagrelor (BRILINTA) 90 MG TABS tablet Take 1 tablet (90 mg total) by mouth 2 (two) times daily. 60 tablet 1  . topiramate (TOPAMAX) 50 MG tablet Take 50 mg by mouth 2 (two) times daily as needed (nerve pain). For nerve pain    . traMADol (ULTRAM) 50 MG tablet Take 100 mg by mouth 3 (three) times daily as needed (pain).      No facility-administered medications prior to visit.      Allergies:   Toradol; Other; and Adhesive   Social History   Social History  . Marital Status: Married    Spouse Name: N/A  . Number of Children: 5  . Years of Education: N/A   Occupational History  . 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 Smoking status: Former Smoker -- 15 years    Types: Cigarettes  . Smokeless tobacco: Never Used     Comment: 5 cig a week  . Alcohol Use: 0.0 oz/week    0 Standard drinks or equivalent per week     Comment: Couple 40 ounces daily until 2012  . Drug Use: No  . Sexual Activity: Not Asked   Other Topics Concern  . None   Social History Narrative     Family History:  The patient's family history includes Heart disease in his mother; Prostate cancer in his brother.   ROS:   Please see the history of present illness.    Review of Systems  Musculoskeletal: Positive for back pain, joint pain and myalgias.   All other systems reviewed and are negative.   Physical Exam:    VS:  BP 140/80 mmHg  Pulse 62  Ht 5\' 9"  (1.753 m)  Wt 222 lb 12.8 oz (101.061 kg)  BMI 32.89 kg/m2   Physical Exam  Constitutional: He is oriented to person, place, and time. He appears well-developed and well-nourished.  HENT:  Head: Normocephalic.  Neck: Neck supple. No JVD present. Carotid bruit is not present.  Cardiovascular: Normal rate, regular rhythm and normal heart sounds.   No murmur  heard. Pulmonary/Chest: Effort normal and breath sounds normal. He has no wheezes. He has no rales.  Abdominal: Soft. There is no tenderness.  Musculoskeletal: He exhibits no edema.  right wrist without hematoma or mass   Neurological:  He is alert and oriented to person, place, and time.  Skin: Skin is warm and dry.  Psychiatric: He has a normal mood and affect.    Wt Readings from Last 3 Encounters:  08/02/15 222 lb 12.8 oz (101.061 kg)  07/17/15 222 lb 14.2 oz (101.1 kg)  05/15/14 238 lb (107.956 kg)      Studies/Labs Reviewed:     EKG:  EKG is  ordered today.  The ekg ordered today demonstrates NSR, HR 63, normal axis, inferior Q waves, T-wave inversions 2, 3, aVF, V3-V5, QTc 442 ms  Recent Labs: 07/15/2015: ALT 59 07/18/2015: BUN 12; Creatinine, Ser 1.13; Hemoglobin 11.7*; Platelets 263; Potassium 4.1; Sodium 137   Recent Lipid Panel    Component Value Date/Time   CHOL 149 07/15/2015 0717   TRIG 82 07/15/2015 0717   HDL 37* 07/15/2015 0717   CHOLHDL 4.0 07/15/2015 0717   VLDL 16 07/15/2015 0717   LDLCALC 96 07/15/2015 0717    Additional studies/ records that were reviewed today include:   Echo 07/15/15 - Left ventricle: The cavity size was normal. Wall thickness was  increased in a pattern of mild LVH. Systolic function was normal.  The estimated ejection fraction was in the range of 50% to 55%.  Wall motion was normal; there were no regional wall motion  abnormalities. The study is not technically sufficient to allow  evaluation of LV diastolic function. - Aortic valve: Valve area (VTI): 4.03 cm^2. Valve area (Vmax):  3.85 cm^2. - Right ventricle: The cavity size was moderately dilated. The RV  free wall is hypokinetic, with hypercontractility of the RV apex.  Findings consistent with McConnell&'s sign suggestiive of  pulmonary embolism. From literature review this sign can also be  present in RCA infarcts. Systolic function was moderately to  severely  reduced. RV TAPSE is 1.0 cm.  LHC 07/15/15 LAD proximal 20% LCx proximal 20%, OM1 30% RCA proximal 100% EF 45-50% with inferior wall motion abnormalities PCI:  STENT PROMUS PREM MR 3.5X24 To the RCA 1. Single vessel occlusive CAD. 2. Mild LV dysfunction 3. Successful PCI of the RCA with aspiration thrombectomy and stenting of the proximal RCA with DES. Plan: DAPT for one year. Continue Aggrastat for 18 hours.   ASSESSMENT:     1. NSTEMI (non-ST elevated myocardial infarction) ([place])   2. CAD in native artery, 07/15/15 PCI of RCA with DES   3. Essential hypertension   4. Hyperlipidemia   5. Type 2 diabetes mellitus with other circulatory complication, without long-term current use of insulin (HCC)     PLAN:     In order of problems listed above:  1. S/p NSTEMI - Occluded RCA was treated with thrombectomy and DES. He is doing well.  No angina. We discussed the importance of dual antiplatelet therapy. .  Continue aspirin, Brilinta, beta blocker, high-dose statin. Blood pressure is above target. I will add ACE inhibitor to his regimen. Refer to cardiac rehabilitation.  2. CAD - He has mild nonobstructive residual CAD in the LAD and LCx. This is to be treated medically. Continue aspirin, Brilinta, statin, beta blocker.  3. HTN - Borderline control. Add lisinopril 5 mg daily. Check BMET 1 week.  4. HL - Continue high-dose statin in setting of myocardial infarction. Arrange lipids and LFTs in 8 weeks.  5. Diabetes - Add ACE inhibitor as noted above. Follow-up with PCP for strict control.   Medication Adjustments/Labs and Tests Ordered: Current medicines are reviewed at length with the patient  today.  Concerns regarding medicines are outlined above.  Medication changes, Labs and Tests ordered today are outlined in the Patient Instructions noted below. Patient Instructions  Medication Instructions:  START LISINOPRIL 5 MG ONCE DAILY Labwork: Your physician recommends that you return  for lab work in: [place] recommends that you return for lab work in: [place] TO LAB WORK  Follow-Up: Your physician recommends that you schedule a follow-up appointment in: [place], Richardson Dopp, PA-C  08/02/2015 1:39 PM    [facility] [address], [place], [place]  [address] Phone: [phone]; Fax: [phone]

## 2015-08-02 NOTE — Patient Instructions (Addendum)
Medication Instructions:  START LISINOPRIL 5 MG ONCE DAILY Labwork: Your physician recommends that you return for lab work in: [place] recommends that you return for lab work in: [place] TO LAB WORK  Follow-Up: Your physician recommends that you schedule a follow-up appointment in: [place]

## 2015-08-05 ENCOUNTER — Inpatient Hospital Stay (HOSPITAL_COMMUNITY)
Admission: EM | Admit: 2015-08-05 | Discharge: 2015-08-08 | DRG: 281 | Disposition: A | Discharge status: Home / Self Care | Payer: Medicare Other | Attending: Interventional Cardiology | Admitting: Interventional Cardiology

## 2015-08-05 ENCOUNTER — Encounter (HOSPITAL_COMMUNITY): Payer: Self-pay

## 2015-08-05 ENCOUNTER — Emergency Department (HOSPITAL_COMMUNITY): Payer: Medicare Other

## 2015-08-05 DIAGNOSIS — I2510 Atherosclerotic heart disease of native coronary artery without angina pectoris: Secondary | ICD-10-CM | POA: Diagnosis present

## 2015-08-05 DIAGNOSIS — I200 Unstable angina: Secondary | ICD-10-CM | POA: Diagnosis present

## 2015-08-05 DIAGNOSIS — I493 Ventricular premature depolarization: Secondary | ICD-10-CM | POA: Diagnosis present

## 2015-08-05 DIAGNOSIS — Z955 Presence of coronary angioplasty implant and graft: Secondary | ICD-10-CM

## 2015-08-05 DIAGNOSIS — K219 Gastro-esophageal reflux disease without esophagitis: Secondary | ICD-10-CM | POA: Diagnosis present

## 2015-08-05 DIAGNOSIS — I25110 Atherosclerotic heart disease of native coronary artery with unstable angina pectoris: Principal | ICD-10-CM | POA: Diagnosis present

## 2015-08-05 DIAGNOSIS — I5020 Unspecified systolic (congestive) heart failure: Secondary | ICD-10-CM | POA: Diagnosis present

## 2015-08-05 DIAGNOSIS — Z7984 Long term (current) use of oral hypoglycemic drugs: Secondary | ICD-10-CM

## 2015-08-05 DIAGNOSIS — Z7902 Long term (current) use of antithrombotics/antiplatelets: Secondary | ICD-10-CM

## 2015-08-05 DIAGNOSIS — R079 Chest pain, unspecified: Secondary | ICD-10-CM

## 2015-08-05 DIAGNOSIS — I110 Hypertensive heart disease with heart failure: Secondary | ICD-10-CM | POA: Diagnosis present

## 2015-08-05 DIAGNOSIS — E785 Hyperlipidemia, unspecified: Secondary | ICD-10-CM | POA: Diagnosis present

## 2015-08-05 DIAGNOSIS — I472 Ventricular tachycardia: Secondary | ICD-10-CM | POA: Diagnosis present

## 2015-08-05 DIAGNOSIS — Z87891 Personal history of nicotine dependence: Secondary | ICD-10-CM

## 2015-08-05 DIAGNOSIS — Z7901 Long term (current) use of anticoagulants: Secondary | ICD-10-CM

## 2015-08-05 DIAGNOSIS — M549 Dorsalgia, unspecified: Secondary | ICD-10-CM | POA: Diagnosis present

## 2015-08-05 DIAGNOSIS — G8929 Other chronic pain: Secondary | ICD-10-CM | POA: Diagnosis present

## 2015-08-05 DIAGNOSIS — Z79899 Other long term (current) drug therapy: Secondary | ICD-10-CM

## 2015-08-05 DIAGNOSIS — E119 Type 2 diabetes mellitus without complications: Secondary | ICD-10-CM | POA: Diagnosis present

## 2015-08-05 DIAGNOSIS — I214 Non-ST elevation (NSTEMI) myocardial infarction: Secondary | ICD-10-CM | POA: Diagnosis present

## 2015-08-05 DIAGNOSIS — I10 Essential (primary) hypertension: Secondary | ICD-10-CM | POA: Diagnosis present

## 2015-08-05 DIAGNOSIS — Z7982 Long term (current) use of aspirin: Secondary | ICD-10-CM

## 2015-08-05 HISTORY — DX: Acute myocardial infarction, unspecified: I21.9

## 2015-08-05 LAB — CBC WITH DIFFERENTIAL/PLATELET
Basophils Absolute: 0 10*3/uL (ref 0.0–0.1)
Basophils Relative: 0 %
Eosinophils Absolute: 0.3 10*3/uL (ref 0.0–0.7)
Eosinophils Relative: 4 %
HCT: 37.4 % — ABNORMAL LOW (ref 39.0–52.0)
Hemoglobin: 12.1 g/dL — ABNORMAL LOW (ref 13.0–17.0)
Lymphocytes Relative: 36 %
Lymphs Abs: 2.8 10*3/uL (ref 0.7–4.0)
MCH: 26.5 pg (ref 26.0–34.0)
MCHC: 32.4 g/dL (ref 30.0–36.0)
MCV: 81.8 fL (ref 78.0–100.0)
Monocytes Absolute: 0.7 10*3/uL (ref 0.1–1.0)
Monocytes Relative: 9 %
Neutro Abs: 3.8 10*3/uL (ref 1.7–7.7)
Neutrophils Relative %: 51 %
Platelets: 337 10*3/uL (ref 150–400)
RBC: 4.57 MIL/uL (ref 4.22–5.81)
RDW: 16.1 % — ABNORMAL HIGH (ref 11.5–15.5)
WBC: 7.6 10*3/uL (ref 4.0–10.5)

## 2015-08-05 LAB — BASIC METABOLIC PANEL
Anion gap: 10 (ref 5–15)
BUN: 8 mg/dL (ref 6–20)
CO2: 22 mmol/L (ref 22–32)
Calcium: 8.9 mg/dL (ref 8.9–10.3)
Chloride: 108 mmol/L (ref 101–111)
Creatinine, Ser: 0.95 mg/dL (ref 0.61–1.24)
GFR calc Af Amer: >60 mL/min (ref >60)
GFR calc non Af Amer: >60 mL/min (ref >60)
Glucose, Bld: 90 mg/dL (ref 65–99)
Potassium: 3.2 mmol/L — ABNORMAL LOW (ref 3.5–5.1)
Sodium: 140 mmol/L (ref 135–145)

## 2015-08-05 LAB — I-STAT TROPONIN, ED: Troponin i, poc: 0.09 ng/mL (ref 0.00–0.08)

## 2015-08-05 MED ORDER — MORPHINE SULFATE (PF) 2 MG/ML IV SOLN
2.0000 mg | Freq: Once | INTRAVENOUS | Status: AC
  Administered 2015-08-05: 2 mg via INTRAVENOUS
  Filled 2015-08-05: qty 1

## 2015-08-05 MED ORDER — SODIUM CHLORIDE 0.9 % IV BOLUS (SEPSIS)
1000.0000 mL | Freq: Once | INTRAVENOUS | Status: AC
  Administered 2015-08-05: 1000 mL via INTRAVENOUS

## 2015-08-05 NOTE — ED Notes (Signed)
To room via EMS.  Onset 7pm right of sternum CP and diaphoresis.  Worse with deep breath and movement.  Pt reports he hasn't felt good all day, came home from church and laid down to rest.  Pt took NTG x 2, EMS gave NTG X 1 and ASA 324 mg.  EMS reports EKG SR and several runs of VT.  Pt moaning.

## 2015-08-05 NOTE — ED Provider Notes (Signed)
CSN: [id number]     Arrival date & time 08/05/15  2137 History   First MD Initiated Contact with Patient 08/05/15 2140     Chief Complaint  Patient presents with  . Chest Pain   HPI   60 year old male with a history of diabetes, hypertension, hyperlipidemia, chronic back pain presents today with chest pain. Patient was admitted on 521 through 69 with non-STEMI. LHC demonstrated no occluded proximal RCA which was treated with aspiration thrombectomy and DES. Patient followed up with cardiology as an outpatient, was not having any chest pain, shortness of breath, lower extremity edema.  Patient notes that he was feeling well, went to church this morning. He reports that he was feeling tired and exhausted this afternoon with onset of chest pain around 7 PM. Patient reports that chest pain as "tightness" in the right anterior chest, worse with Movements. Patient reports symptoms are improved with sitting up, but continues to lie on his back. Patient reports symptoms are similar to previous episode in which she was admitted to the hospital. Patient denies any abdominal pain, nausea, vomiting. He denies any radiation of symptoms. After onset of symptoms patient took 2 of his own nitroglycerin, and called EMS. He reports the [place] seemed to help alleviate some of his his comfort. In route EMS administered another nitroglycerin and aspirin. At this time my evaluation patient reports he still experiencing discomfort but not as severe's onset of symptoms.  Patient denies any cough, upper respiratory complaints.    Past Medical History  Diagnosis Date  . Arthritis     Rheumatoid  . GERD (gastroesophageal reflux disease)   . Hyperlipidemia   . Hypertension     Dr. [name] [phone]  . Chronic back pain   . 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 Diabetes mellitus     diet controlled  . Cyst (solitary) of breast     ? cyst left calf ,knee  . Baker's cyst     Left calf  .  CAD in native artery, 07/15/15 PCI of RCA with DES 07/16/2015    A.   NSTEMI 5/17: LHC - pLAD 20, pLCx 20, OM1 30, pRCA 100, EF 45-50%>> PCI: 2.5 x 24 mm Promus DES to RCA  //  b.   Echo 5/17: mild LVH, EF 50-55%, no RWMA, mod RVE    Past Surgical History  Procedure Laterality Date  . Lumbar spine surgery  03/2009  & 2012  . Wisdom tooth extraction      hx of  . Mass excision N/A 04/19/2012    Procedure: removal of posterior cervical lipoma;  Surgeon: [name], MD;  Location: [facility];  Service: Neurosurgery;  Laterality: N/A;  Removal of posterior cervical lipoma  . Spinal cord stimulator insertion N/A 01/07/2013    Procedure:  SPINAL CORD STIMULATOR INSERTION;  Surgeon: [name], MD;  Location: [facility];  Service: Neurosurgery;  Laterality: N/A;  . Cardiac catheterization N/A 07/15/2015    Procedure: Left Heart Cath and Coronary Angiography;  Surgeon: [name], MD;  Location: [facility];  Service: Cardiovascular;  Laterality: N/A;  . Cardiac catheterization N/A 07/15/2015    Procedure: Coronary Stent Intervention;  Surgeon: [name], MD;  Location: [facility];  Service: Cardiovascular;  Laterality: N/A;   Family History  Problem Relation Age of Onset  . Heart disease Mother   . Prostate cancer Brother    Social  History  Substance Use Topics  . Smoking status: Former Smoker -- 15 years    Types: Cigarettes  . Smokeless tobacco: Never Used     Comment: 5 cig a week  . Alcohol Use: 0.0 oz/week    0 Standard drinks or equivalent per week     Comment: Couple 40 ounces daily until 2012    Review of Systems  All other systems reviewed and are negative.   Allergies  Toradol; Other; and Adhesive  Home Medications   Prior to Admission medications   Medication Sig Start Date End Date Taking? Authorizing Provider  aspirin EC 81 MG tablet Take 1 tablet (81 mg total) by mouth daily. 07/18/15  Yes Alexa Angela Burke, MD  atorvastatin (LIPITOR) 80  MG tablet Take 1 tablet (80 mg total) by mouth daily at 6 PM. 07/18/15  Yes Alexa Angela Burke, MD  cyclobenzaprine (FLEXERIL) 10 MG tablet Take 10 mg by mouth 3 (three) times daily as needed for muscle spasms.    Yes Historical Provider, MD  diazepam (VALIUM) 10 MG tablet Take 10 mg by mouth 2 (two) times daily. For spasms   Yes Historical Provider, MD  DULoxetine (CYMBALTA) 60 MG capsule Take 60 mg by mouth daily.   Yes Historical Provider, MD  lisinopril (PRINIVIL,ZESTRIL) 5 MG tablet Take 1 tablet (5 mg total) by mouth daily. 08/02/15  Yes Scott Joylene Draft, PA-C  meloxicam (MOBIC) 15 MG tablet Take 15 mg by mouth daily.   Yes Historical Provider, MD  metFORMIN (GLUCOPHAGE-XR) 750 MG 24 hr tablet Take 750 mg by mouth daily with supper. 06/07/15  Yes Historical Provider, MD  metoprolol tartrate (LOPRESSOR) 25 MG tablet Take 0.5 tablets (12.5 mg total) by mouth 2 (two) times daily. 07/18/15  Yes Alexa Angela Burke, MD  nitroGLYCERIN (NITROSTAT) 0.4 MG SL tablet Place 1 tablet (0.4 mg total) under the tongue every 5 (five) minutes as needed for chest pain. 07/18/15  Yes Scott Joylene Draft, PA-C  omeprazole (PRILOSEC) 40 MG capsule Take 40 mg by mouth daily.   Yes Historical Provider, MD  Oxycodone HCl 10 MG TABS Take 1 tablet (10 mg total) by mouth every 4 (four) hours. Patient taking differently: Take 10 mg by mouth every 6 (six) hours.  01/07/13  Yes Clydell Hakim, MD  polyethylene glycol powder (GLYCOLAX/MIRALAX) powder Take 17 g by mouth daily as needed (constipation). Mix in 8 oz water, juice, soda, coffee or tea and drink 06/07/15  Yes Historical Provider, MD  pregabalin (LYRICA) 100 MG capsule Take 100 mg by mouth 3 (three) times daily.    Yes Historical Provider, MD  ticagrelor (BRILINTA) 90 MG TABS tablet Take 1 tablet (90 mg total) by mouth 2 (two) times daily. 07/18/15  Yes Alexa Angela Burke, MD  topiramate (TOPAMAX) 50 MG tablet Take 50 mg by mouth 2 (two) times daily as needed (nerve pain). For nerve pain   Yes  Historical Provider, MD  traMADol (ULTRAM) 50 MG tablet Take 100 mg by mouth 3 (three) times daily as needed (pain).    Yes Historical Provider, MD   BP 145/92 mmHg  Pulse 62  Temp(Src) 97.7 F (36.5 C) (Oral)  Resp 19  Ht 5\' 10"  (1.778 m)  Wt 100.699 kg  BMI 31.85 kg/m2  SpO2 99% Physical Exam  Constitutional: He is oriented to person, place, and time. He appears well-developed and well-nourished.  HENT:  Head: Normocephalic and atraumatic.  Eyes: Conjunctivae are normal. Pupils are equal, round, and reactive to  light. Right eye exhibits no discharge. Left eye exhibits no discharge. No scleral icterus.  Neck: Normal range of motion. No JVD present. No tracheal deviation present.  Cardiovascular: Normal rate, regular rhythm and normal heart sounds.   Pulmonary/Chest: Effort normal and breath sounds normal. No stridor. No respiratory distress. He has no wheezes. He has no rales. He exhibits tenderness.  Minor TTP of right anterior chest wall; no rash bruising noted  Abdominal: Soft. He exhibits distension. He exhibits no mass. There is no tenderness. There is no rebound and no guarding.  Musculoskeletal: He exhibits no edema or tenderness.  Neurological: He is alert and oriented to person, place, and time. Coordination normal.  Skin: Skin is warm and dry. No rash noted. No pallor.  Psychiatric: He has a normal mood and affect. His behavior is normal. Judgment and thought content normal.  Nursing note and vitals reviewed.   ED Course  Procedures (including critical care time) Labs Review Labs Reviewed  CBC WITH DIFFERENTIAL/PLATELET - Abnormal; Notable for the following:    Hemoglobin 12.1 (*)    HCT 37.4 (*)    RDW 16.1 (*)    All other components within normal limits  BASIC METABOLIC PANEL - Abnormal; Notable for the following:    Potassium 3.2 (*)    All other components within normal limits  I-STAT TROPOININ, ED - Abnormal; Notable for the following:    Troponin i, poc  0.09 (*)    All other components within normal limits    Imaging Review Dg Chest Portable 1 View  08/05/2015  CLINICAL DATA:  7 p.m. today, onset right sternal chest pain and diaphoresis. EXAM: PORTABLE CHEST 1 VIEW COMPARISON:  07/15/2015 FINDINGS: Shallow inspiration. Heart size and pulmonary vascularity are normal. No focal airspace disease or consolidation. No blunting of costophrenic angles. No pneumothorax. Mediastinal contours appear intact. Stimulator leads projected over the mid thoracic spine. IMPRESSION: No active disease. Electronically Signed   By: Lucienne Capers M.D.   On: 08/05/2015 22:52   I have personally reviewed and evaluated these images and lab results as part of my medical decision-making.   EKG Interpretation   Date/Time:  Sunday August 05 2015 22:09:35 EDT Ventricular Rate:  65 PR Interval:  175 QRS Duration: 102 QT Interval:  466 QTC Calculation: 485 R Axis:   26 Text Interpretation:  Sinus rhythm Inferior infarct, age indeterminate TW  changes similar to prior Prior ST elevations/PR depressions have resolved  Confirmed by [facility] MD, [place] ([id number]) on 08/05/2015 11:32:26 PM      MDM   Final diagnoses:  Chest pain, unspecified chest pain type    Labs: I-STAT troponin, CBC and BMP  Imaging: DG chest portable  Consults: Cardiology  Therapeutics: Morphine, normal saline  Discharge Meds:   Assessment/Plan: 70-year-old male with a history of an STEMI with drug-eluting stent placement presents today with chest pain reminiscent of his and STEMI. Patient has mild elevation in troponin, no significant EKG findings. Due to patient's presentation, story, and recent cardiac intervention cardiologist was consult for admission. Patient's pain was managed here in the ED, he appeared to be in no acute distress.         Okey Regal, PA-C 08/06/15 [id number]  Gareth Morgan, MD 08/06/15 1252

## 2015-08-06 ENCOUNTER — Encounter (HOSPITAL_COMMUNITY): Payer: Self-pay | Admitting: General Practice

## 2015-08-06 DIAGNOSIS — I10 Essential (primary) hypertension: Secondary | ICD-10-CM

## 2015-08-06 DIAGNOSIS — I493 Ventricular premature depolarization: Secondary | ICD-10-CM | POA: Diagnosis present

## 2015-08-06 DIAGNOSIS — Z7901 Long term (current) use of anticoagulants: Secondary | ICD-10-CM | POA: Diagnosis not present

## 2015-08-06 DIAGNOSIS — E785 Hyperlipidemia, unspecified: Secondary | ICD-10-CM | POA: Diagnosis present

## 2015-08-06 DIAGNOSIS — I25111 Atherosclerotic heart disease of native coronary artery with angina pectoris with documented spasm: Secondary | ICD-10-CM

## 2015-08-06 DIAGNOSIS — I2510 Atherosclerotic heart disease of native coronary artery without angina pectoris: Secondary | ICD-10-CM | POA: Diagnosis not present

## 2015-08-06 DIAGNOSIS — M549 Dorsalgia, unspecified: Secondary | ICD-10-CM | POA: Diagnosis present

## 2015-08-06 DIAGNOSIS — K219 Gastro-esophageal reflux disease without esophagitis: Secondary | ICD-10-CM | POA: Diagnosis present

## 2015-08-06 DIAGNOSIS — R079 Chest pain, unspecified: Secondary | ICD-10-CM | POA: Diagnosis present

## 2015-08-06 DIAGNOSIS — I25110 Atherosclerotic heart disease of native coronary artery with unstable angina pectoris: Secondary | ICD-10-CM | POA: Diagnosis present

## 2015-08-06 DIAGNOSIS — G8929 Other chronic pain: Secondary | ICD-10-CM | POA: Diagnosis present

## 2015-08-06 DIAGNOSIS — Z7902 Long term (current) use of antithrombotics/antiplatelets: Secondary | ICD-10-CM | POA: Diagnosis not present

## 2015-08-06 DIAGNOSIS — Z87891 Personal history of nicotine dependence: Secondary | ICD-10-CM | POA: Diagnosis not present

## 2015-08-06 DIAGNOSIS — I214 Non-ST elevation (NSTEMI) myocardial infarction: Secondary | ICD-10-CM | POA: Diagnosis present

## 2015-08-06 DIAGNOSIS — I200 Unstable angina: Secondary | ICD-10-CM | POA: Diagnosis not present

## 2015-08-06 DIAGNOSIS — E119 Type 2 diabetes mellitus without complications: Secondary | ICD-10-CM | POA: Diagnosis present

## 2015-08-06 DIAGNOSIS — Z955 Presence of coronary angioplasty implant and graft: Secondary | ICD-10-CM | POA: Diagnosis not present

## 2015-08-06 DIAGNOSIS — I472 Ventricular tachycardia: Secondary | ICD-10-CM | POA: Diagnosis present

## 2015-08-06 DIAGNOSIS — Z79899 Other long term (current) drug therapy: Secondary | ICD-10-CM | POA: Diagnosis not present

## 2015-08-06 DIAGNOSIS — I5020 Unspecified systolic (congestive) heart failure: Secondary | ICD-10-CM | POA: Diagnosis present

## 2015-08-06 DIAGNOSIS — Z7984 Long term (current) use of oral hypoglycemic drugs: Secondary | ICD-10-CM | POA: Diagnosis not present

## 2015-08-06 DIAGNOSIS — Z7982 Long term (current) use of aspirin: Secondary | ICD-10-CM | POA: Diagnosis not present

## 2015-08-06 DIAGNOSIS — I5023 Acute on chronic systolic (congestive) heart failure: Secondary | ICD-10-CM | POA: Diagnosis not present

## 2015-08-06 DIAGNOSIS — I110 Hypertensive heart disease with heart failure: Secondary | ICD-10-CM | POA: Diagnosis present

## 2015-08-06 LAB — TROPONIN I
Troponin I: 0.07 ng/mL — ABNORMAL HIGH (ref <0.031)
Troponin I: 0.07 ng/mL — ABNORMAL HIGH (ref <0.031)
Troponin I: 0.06 ng/mL — ABNORMAL HIGH (ref <0.031)

## 2015-08-06 LAB — HEPARIN LEVEL (UNFRACTIONATED)
Heparin Unfractionated: 0.17 IU/mL — ABNORMAL LOW (ref 0.30–0.70)
Heparin Unfractionated: 0.23 IU/mL — ABNORMAL LOW (ref 0.30–0.70)

## 2015-08-06 MED ORDER — TOPIRAMATE 100 MG PO TABS: 50.0000 mg | ORAL_TABLET | Freq: Two times a day (BID) | ORAL | Status: DC | PRN

## 2015-08-06 MED ORDER — SODIUM CHLORIDE 0.9 % WEIGHT BASED INFUSION: 1.0000 mL/kg/h | INTRAVENOUS | Status: DC

## 2015-08-06 MED ORDER — NITROGLYCERIN IN D5W 200-5 MCG/ML-% IV SOLN
0.0000 ug/min | INTRAVENOUS | Status: DC
  Administered 2015-08-06: 5 ug/min via INTRAVENOUS
  Administered 2015-08-07: 20 ug/min via INTRAVENOUS
  Filled 2015-08-06: qty 250

## 2015-08-06 MED ORDER — PANTOPRAZOLE SODIUM 40 MG PO TBEC
40.0000 mg | DELAYED_RELEASE_TABLET | Freq: Every day | ORAL | Status: DC
  Administered 2015-08-06 – 2015-08-08 (×3): 40 mg via ORAL
  Filled 2015-08-06 (×3): qty 1

## 2015-08-06 MED ORDER — CYCLOBENZAPRINE HCL 10 MG PO TABS
10.0000 mg | ORAL_TABLET | Freq: Three times a day (TID) | ORAL | Status: DC | PRN
  Administered 2015-08-07: 10 mg via ORAL
  Filled 2015-08-06: qty 1

## 2015-08-06 MED ORDER — METFORMIN HCL ER 500 MG PO TB24: 750.0000 mg | ORAL_TABLET | Freq: Every day | ORAL | Status: DC

## 2015-08-06 MED ORDER — ONDANSETRON HCL 4 MG/2ML IJ SOLN
4.0000 mg | Freq: Four times a day (QID) | INTRAMUSCULAR | Status: DC | PRN
  Administered 2015-08-06 (×2): 4 mg via INTRAVENOUS
  Filled 2015-08-06 (×2): qty 2

## 2015-08-06 MED ORDER — POLYETHYLENE GLYCOL 3350 17 G PO PACK: 17.0000 g | PACK | Freq: Every day | ORAL | Status: DC | PRN

## 2015-08-06 MED ORDER — MELOXICAM 7.5 MG PO TABS
15.0000 mg | ORAL_TABLET | Freq: Every day | ORAL | Status: DC
  Administered 2015-08-07 – 2015-08-08 (×2): 15 mg via ORAL
  Filled 2015-08-06 (×3): qty 2

## 2015-08-06 MED ORDER — ASPIRIN 81 MG PO CHEW
81.0000 mg | CHEWABLE_TABLET | ORAL | Status: AC
  Administered 2015-08-07: 81 mg via ORAL
  Filled 2015-08-06: qty 1

## 2015-08-06 MED ORDER — ATORVASTATIN CALCIUM 80 MG PO TABS
80.0000 mg | ORAL_TABLET | Freq: Every day | ORAL | Status: DC
  Administered 2015-08-06 – 2015-08-07 (×2): 80 mg via ORAL
  Filled 2015-08-06 (×2): qty 1

## 2015-08-06 MED ORDER — HEPARIN BOLUS VIA INFUSION
4000.0000 [IU] | Freq: Once | INTRAVENOUS | Status: AC
  Administered 2015-08-06: 4000 [IU] via INTRAVENOUS
  Filled 2015-08-06: qty 4000

## 2015-08-06 MED ORDER — TRAMADOL HCL 50 MG PO TABS
100.0000 mg | ORAL_TABLET | Freq: Three times a day (TID) | ORAL | Status: DC | PRN
Start: 2015-08-06 — End: 2015-08-08
  Administered 2015-08-07: 100 mg via ORAL
  Filled 2015-08-06: qty 2

## 2015-08-06 MED ORDER — SODIUM CHLORIDE 0.9% FLUSH
3.0000 mL | Freq: Two times a day (BID) | INTRAVENOUS | Status: DC
  Administered 2015-08-06 – 2015-08-07 (×2): 3 mL via INTRAVENOUS

## 2015-08-06 MED ORDER — LISINOPRIL 5 MG PO TABS
5.0000 mg | ORAL_TABLET | Freq: Every day | ORAL | Status: DC
  Administered 2015-08-06 – 2015-08-08 (×3): 5 mg via ORAL
  Filled 2015-08-06 (×3): qty 1

## 2015-08-06 MED ORDER — DULOXETINE HCL 60 MG PO CPEP
60.0000 mg | ORAL_CAPSULE | Freq: Every day | ORAL | Status: DC
  Administered 2015-08-06 – 2015-08-08 (×3): 60 mg via ORAL
  Filled 2015-08-06 (×3): qty 1

## 2015-08-06 MED ORDER — NITROGLYCERIN 0.4 MG SL SUBL
0.4000 mg | SUBLINGUAL_TABLET | SUBLINGUAL | Status: DC | PRN
Start: 2015-08-06 — End: 2015-08-06

## 2015-08-06 MED ORDER — PREGABALIN 50 MG PO CAPS
100.0000 mg | ORAL_CAPSULE | Freq: Three times a day (TID) | ORAL | Status: DC
  Administered 2015-08-06 – 2015-08-08 (×8): 100 mg via ORAL
  Filled 2015-08-06 (×8): qty 2

## 2015-08-06 MED ORDER — ASPIRIN EC 81 MG PO TBEC
81.0000 mg | DELAYED_RELEASE_TABLET | Freq: Every day | ORAL | Status: DC
  Administered 2015-08-06 – 2015-08-08 (×3): 81 mg via ORAL
  Filled 2015-08-06 (×3): qty 1

## 2015-08-06 MED ORDER — SODIUM CHLORIDE 0.9 % IV SOLN: 250.0000 mL | INTRAVENOUS | Status: DC | PRN

## 2015-08-06 MED ORDER — SODIUM CHLORIDE 0.9 % WEIGHT BASED INFUSION: 3.0000 mL/kg/h | INTRAVENOUS | Status: DC

## 2015-08-06 MED ORDER — SODIUM CHLORIDE 0.9% FLUSH: 3.0000 mL | INTRAVENOUS | Status: DC | PRN

## 2015-08-06 MED ORDER — OXYCODONE HCL 5 MG PO TABS
10.0000 mg | ORAL_TABLET | Freq: Three times a day (TID) | ORAL | Status: DC
  Administered 2015-08-06 – 2015-08-08 (×10): 10 mg via ORAL
  Filled 2015-08-06 (×10): qty 2

## 2015-08-06 MED ORDER — ACETAMINOPHEN 325 MG PO TABS
650.0000 mg | ORAL_TABLET | ORAL | Status: DC | PRN
  Administered 2015-08-06 – 2015-08-07 (×4): 650 mg via ORAL
  Filled 2015-08-06 (×4): qty 2

## 2015-08-06 MED ORDER — DIAZEPAM 5 MG PO TABS
10.0000 mg | ORAL_TABLET | Freq: Two times a day (BID) | ORAL | Status: DC | PRN
  Administered 2015-08-07 (×2): 10 mg via ORAL
  Filled 2015-08-06 (×2): qty 2

## 2015-08-06 MED ORDER — NITROGLYCERIN 0.4 MG SL SUBL
0.4000 mg | SUBLINGUAL_TABLET | SUBLINGUAL | Status: DC | PRN
  Administered 2015-08-06: 0.4 mg via SUBLINGUAL
  Filled 2015-08-06: qty 1

## 2015-08-06 MED ORDER — TICAGRELOR 90 MG PO TABS
90.0000 mg | ORAL_TABLET | Freq: Two times a day (BID) | ORAL | Status: DC
  Administered 2015-08-06 – 2015-08-08 (×6): 90 mg via ORAL
  Filled 2015-08-06 (×6): qty 1

## 2015-08-06 MED ORDER — METOPROLOL TARTRATE 12.5 MG HALF TABLET
12.5000 mg | ORAL_TABLET | Freq: Two times a day (BID) | ORAL | Status: DC
  Administered 2015-08-06 – 2015-08-08 (×6): 12.5 mg via ORAL
  Filled 2015-08-06 (×6): qty 1

## 2015-08-06 MED ORDER — CETYLPYRIDINIUM CHLORIDE 0.05 % MT LIQD
7.0000 mL | Freq: Two times a day (BID) | OROMUCOSAL | Status: DC
  Administered 2015-08-06 – 2015-08-08 (×3): 7 mL via OROMUCOSAL

## 2015-08-06 MED ORDER — HEPARIN (PORCINE) IN NACL 100-0.45 UNIT/ML-% IJ SOLN
1550.0000 [IU]/h | INTRAMUSCULAR | Status: DC
  Administered 2015-08-06: 1200 [IU]/h via INTRAVENOUS
  Filled 2015-08-06 (×2): qty 250

## 2015-08-06 NOTE — Progress Notes (Signed)
ANTICOAGULATION CONSULT NOTE - Initial Consult  Pharmacy Consult for Heparin  Indication: chest pain/ACS  Allergies  Allergen Reactions  . Toradol [Ketorolac Tromethamine] Anaphylaxis    bp dropped, diaphoresis  . Other     Allergic reaction to steroid? Unsure of name  . Adhesive [Tape] Rash and Other (See Comments)    "blisters"    Patient Measurements: Height: 5\' 9"  (175.3 cm) Weight: 217 lb 12.8 oz (98.793 kg) IBW/kg (Calculated) : 70.7  Vital Signs: Temp: 97.9 F (36.6 C) (06/12 0602) Temp Source: Oral (06/12 0602) BP: 103/90 mmHg (06/12 1130) Pulse Rate: 63 (06/12 0212)  Labs:  Recent Labs  08/05/15 2202 08/06/15 0323 08/06/15 0757 08/06/15 1031  HGB 12.1*  --   --   --   HCT 37.4*  --   --   --   PLT 337  --   --   --   HEPARINUNFRC  --   --   --  0.17*  CREATININE 0.95  --   --   --   TROPONINI  --  0.07* 0.07*  --     Estimated Creatinine Clearance: 95.8 mL/min (by C-G formula based on Cr of 0.95).   Assessment: 60 y/o M with chest pain, recent stent, on IV heparin, cath tomorrow, Heparin level subtherapeutic 0.17 on 1200 units/hr, Hgb 12.1, renal function good, other labs reviewed.   Goal of Therapy:  Heparin level 0.3-0.7 units/ml Monitor platelets by anticoagulation protocol: Yes   Plan:  -Increase heparin rate to 1400 units/hr -recheck heparin level at 2000 -Daily CBC/HL -Monitor for bleeding  Maryanna Shape, PharmD, BCPS  Clinical Pharmacist  Pager: [phone]   08/06/2015,1:14 PM

## 2015-08-06 NOTE — Progress Notes (Signed)
Patient Name: Thomas Mcclure Date of Encounter: 08/06/2015   SUBJECTIVE  Intermittent chest pressure which responsive to SL nitro. Currently chest pain free. Denies SOB.   CURRENT MEDS . aspirin EC  81 mg Oral Daily  . atorvastatin  80 mg Oral q1800  . DULoxetine  60 mg Oral Daily  . lisinopril  5 mg Oral Daily  . meloxicam  15 mg Oral Q breakfast  . [START ON 08/09/2015] metFORMIN  750 mg Oral Q supper  . metoprolol tartrate  12.5 mg Oral BID  . oxyCODONE  10 mg Oral TID AC & HS  . pantoprazole  40 mg Oral Daily  . pregabalin  100 mg Oral TID  . ticagrelor  90 mg Oral BID    OBJECTIVE  Filed Vitals:   08/06/15 0212 08/06/15 0602 08/06/15 0803 08/06/15 0812  BP: 146/93 160/100 142/90 129/83  Pulse: 63     Temp: 98.2 F (36.8 C) 97.9 F (36.6 C)    TempSrc:  Oral    Resp: 18 16    Height: 5\' 9"  (1.753 m)     Weight: 217 lb 12.8 oz (98.793 kg)     SpO2: 99% 100%      Intake/Output Summary (Last 24 hours) at 08/06/15 0848 Last data filed at 08/06/15 0600  Gross per 24 hour  Intake   30.6 ml  Output    700 ml  Net -669.4 ml   Filed Weights   08/05/15 2211 08/06/15 0212  Weight: 222 lb (100.699 kg) 217 lb 12.8 oz (98.793 kg)    PHYSICAL EXAM  General: Pleasant, NAD. Neuro: Alert and oriented X 3. Moves all extremities spontaneously. Psych: Normal affect. HEENT:  Normal  Neck: Supple without bruits or JVD. Lungs:  Resp regular and unlabored, CTA. Heart: RRR no s3, s4, or murmurs. Abdomen: Soft, non-tender, non-distended, BS + x 4.  Extremities: No clubbing, cyanosis or edema. DP/PT/Radials 2+ and equal bilaterally.  Accessory Clinical Findings  CBC  Recent Labs  08/05/15 2202  WBC 7.6  NEUTROABS 3.8  HGB 12.1*  HCT 37.4*  MCV 81.8  PLT [id number]   Basic Metabolic Panel  Recent Labs  08/05/15 2202  NA 140  K 3.2*  CL 108  CO2 22  GLUCOSE 90  BUN 8  CREATININE 0.95  CALCIUM 8.9   Liver Function Tests No results for input(s): AST, ALT,  ALKPHOS, BILITOT, PROT, ALBUMIN in the last 72 hours. No results for input(s): LIPASE, AMYLASE in the last 72 hours. Cardiac Enzymes  Recent Labs  08/06/15 0323 08/06/15 0757  TROPONINI 0.07* 0.07*    TELE  Sinus rhythm with PVCs  Radiology/Studies  Dg Chest 2 View  07/15/2015  CLINICAL DATA:  Chest pain, back pain, and right shoulder pain. Injury while mowing the lawn. EXAM: CHEST  2 VIEW COMPARISON:  04/16/2012 FINDINGS: Linear atelectasis in the lung bases. No focal airspace disease or consolidation. No blunting of costophrenic angles. No pneumothorax. Normal heart size and pulmonary vascularity. Tortuous aorta. Spinal stimulator leads in the mid thoracic region. IMPRESSION: Shallow inspiration with atelectasis in the lung bases. Electronically Signed   By: Lucienne Capers M.D.   On: 07/15/2015 04:07   Dg Chest Portable 1 View  08/05/2015  CLINICAL DATA:  7 p.m. today, onset right sternal chest pain and diaphoresis. EXAM: PORTABLE CHEST 1 VIEW COMPARISON:  07/15/2015 FINDINGS: Shallow inspiration. Heart size and pulmonary vascularity are normal. No focal airspace disease or consolidation. No blunting of costophrenic angles.  No pneumothorax. Mediastinal contours appear intact. Stimulator leads projected over the mid thoracic spine. IMPRESSION: No active disease. Electronically Signed   By: Lucienne Capers M.D.   On: 08/05/2015 22:52   Ct Angio Chest/abd/pel For Dissection W And/or W/wo  07/15/2015  CLINICAL DATA:  Chest pain and shortness of breath. Assess for dissection. History of hypertension, hyperlipidemia, chronic back pain, neuromuscular disorder, diabetes. EXAM: CT ANGIOGRAPHY CHEST, ABDOMEN AND PELVIS TECHNIQUE: Multidetector CT imaging through the chest, abdomen and pelvis was performed using the standard protocol during bolus administration of intravenous contrast. Multiplanar reconstructed images and MIPs were obtained and reviewed to evaluate the vascular anatomy. CONTRAST:   100 cc Isovue 370 COMPARISON:  Chest radiograph Jul 15, 2015 at 0320 hours FINDINGS: CTA CHEST FINDINGS ARTERIES: Thoracic aorta is normal course and caliber. Mild calcific atherosclerosis. No intrinsic density on noncontrast CT. Homogeneous contrast opacification of thoracic aorta without dissection, aneurysm, luminal irregularity, periaortic fluid collections, or contrast extravasation. MEDIASTINUM: The heart is mildly enlarged. Moderate coronary artery calcifications. No pericardial fluid collection. LUNGS: Tracheobronchial tree is patent, no pneumothorax. Mild bronchial wall thickening. Mild apical bullous changes. Bibasilar dependent atelectasis. 2 mm sub solid subpleural RIGHT pulmonary nodule is likely benign. No pleural effusions, focal consolidations, pulmonary nodules or masses. SOFT TISSUES AND OSSEOUS STRUCTURES: Nonsuspicious. Spinal stimulator leads via T12-L1 interlaminar approach with lead tips at T6. Review of the MIP images confirms the above findings. Re CTA ABDOMEN AND PELVIS FINDINGS ARTERIES: Abdominal aorta is normal course and caliber. Homogeneous contrast opacification of aortoiliac vessels without dissection, aneurysm, luminal irregularity, periaortic fluid collections, or contrast extravasation. Moderate calcific atherosclerosis in intimal thickening. Celiac axis, superior and inferior mesenteric arteries are normal. Ectatic LEFT Common iliac artery to 16 mm. Accessory bilateral renal arteries. SOLID ORGANS: The liver, spleen, gallbladder, pancreas are unremarkable. Nodular thickening of the bilateral adrenal glands most compatible with hyperplasia. GASTROINTESTINAL TRACT: The stomach, small and large bowel are normal in course and caliber without inflammatory changes. Moderate amount of retained large bowel stool. Normal appendix. KIDNEYS/ URINARY TRACT: Kidneys are orthotopic, demonstrating symmetric enhancement. No nephrolithiasis, hydronephrosis or solid renal masses. The unopacified  ureters are normal in course and caliber. Urinary bladder is well distended and unremarkable. PERITONEUM/RETROPERITONEUM: No intraperitoneal free fluid nor free air. Aortoiliac vessels are normal in course and caliber. No lymphadenopathy by CT size criteria. Prostate is mildly enlarged. SOFT TISSUE/OSSEOUS STRUCTURES: Nonsuspicious. Moderate RIGHT and small LEFT fat containing inguinal hernias. Battery pack RIGHT gluteus subcutaneous fat. Status post L4 and L5 laminectomies. L3 through S1 pedicle screws and bridging rods with incorporated posterior bone graft material. Mild lucency of the RIGHT S1 screw. Severe LEFT greater than RIGHT L5-S1 neural foraminal narrowing. Non incorporated L5-S1 interbody fusion material. Moderate degenerative change of the RIGHT hip. Bridging LEFT sacroiliac osteophyte. Review of the MIP images confirms the above findings. IMPRESSION: CTA CHEST: No acute vascular process. Mild bronchial wall thickening can be seen with reactive airway disease or bronchitis. Mild cardiomegaly. CTA ABDOMEN AND PELVIS: No acute vascular process or acute intra-abdominal/pelvic process. Status post L3 through S1 PLIF, with findings of L5-S1 hardware failure. Electronically Signed   By: Elon Alas M.D.   On: 07/15/2015 04:58    ASSESSMENT AND PLAN  1. Unstable angina pectoris ([place]) - Ongoing chest pain improved with sublingual nitroglycerin. Will start IV nitroglycerin. Continue heparin. Cath tomorrow. Troponin flat trend. Continue cycle troponin.   2. Recent STEMI - Occluded RCA was treated with thrombectomy and DES. Continue ASA, brillinta, statin,  ACE and BB.   3. HTN - Stable  4. HL - Continue statin.   5. DM - Hold metformin. Start SSI.     Jarrett Soho PA-C Pager [phone]  Attending Note:   The patient was seen and examined.  Agree with assessment and plan as noted above.  Changes made to the above note as needed.  Patient seen and independently examined  with Robbie Lis, PA.   We discussed all aspects of the encounter. I agree with the assessment and plan as stated above.  Pt presents with angina . Very similar to his presenting symptoms several weeks ago - not as severe.  Has CP , relived reliably with SL NTG. Troponin is minimally elevated.   I think our best option is to proceed with cardiac cath .    A myoview would not likely be helpful.   Discussed risks, benefits, options.  He understands and agrees to proceed     I have spent a total of 25 minutes with patient reviewing hospital  notes , telemetry, EKGs, labs and examining patient as well as establishing an assessment and plan that was discussed with the patient. > 50% of time was spent in direct patient care.    Thayer Headings, Brooke Bonito., MD, [facility] 08/06/2015, 9:14 AM [address],  [place] Pager [phone]

## 2015-08-06 NOTE — H&P (Signed)
History and Physical   Admit date: 08/05/2015 Name:  Thomas Mcclure Medical record number: [id number] DOB/Age:  60-Dec-1957  61 y.o. male  Referring Physician:  [facility]   Primary Cardiologist:  Dr. Daneen Schick  Chief complaint/reason for admission: Chest pain   HPI:  This 60 year old black male has a history of severe chronic back pain.  He had a late presentation of an inferior infarction on 521 17 and had elevation of troponin.  At catheterization he had an occluded right coronary artery treated with thrombectomy and placement of a 3.5 x 24 mm drug-eluting stent.  He was discharged home and was feeling reasonably well.  On Friday morning he had diaphoresis paleness and some substernal pressure while at a graduation took one nitroglycerin with relief.  He felt somewhat poorly over the weekend and awoke this morning feeling report feeling poorly went to church and then had weakness of his legs and generally did not feel well around 7:00 tonight he had the onset of sharp chest pain that was somewhat positional but similar to his previous pain.  He evidently shook all over complained of diaphoresis and sweating and took 2 nitroglycerin without relief after which EMS was called.  Ventricular tachycardia was reported although he has not had any since he was here.  Initial point-of-care troponin was 0.09.  He is currently pain-free EKG did not show any acute changes and appeared improved from the previous one.   Past Medical History  Diagnosis Date  . Arthritis     Rheumatoid  . GERD (gastroesophageal reflux disease)   . Hyperlipidemia   . Hypertension     Dr. [name] [phone]  . Chronic back pain   . 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 Diabetes mellitus     diet controlled  . Cyst (solitary) of breast     ? cyst left calf ,knee  . Baker's cyst     Left calf  . CAD in native artery, 07/15/15 PCI of RCA with DES 07/16/2015    A.   NSTEMI 5/17: LHC  - pLAD 20, pLCx 20, OM1 30, pRCA 100, EF 45-50%>> PCI: 2.5 x 24 mm Promus DES to RCA  //  b.   Echo 5/17: mild LVH, EF 50-55%, no RWMA, mod RVE      Past Surgical History  Procedure Laterality Date  . Lumbar spine surgery  03/2009  & 2012  . Wisdom tooth extraction      hx of  . Mass excision N/A 04/19/2012    Procedure: removal of posterior cervical lipoma;  Surgeon: [name], MD;  Location: [facility];  Service: Neurosurgery;  Laterality: N/A;  Removal of posterior cervical lipoma  . Spinal cord stimulator insertion N/A 01/07/2013    Procedure:  SPINAL CORD STIMULATOR INSERTION;  Surgeon: [name], MD;  Location: [facility];  Service: Neurosurgery;  Laterality: N/A;  . Cardiac catheterization N/A 07/15/2015    Procedure: Left Heart Cath and Coronary Angiography;  Surgeon: [name], MD;  Location: [facility];  Service: Cardiovascular;  Laterality: N/A;  . Cardiac catheterization N/A 07/15/2015    Procedure: Coronary Stent Intervention;  Surgeon: [name], MD;  Location: [facility];  Service: Cardiovascular;  Laterality: N/A;  .  Allergies: is allergic to toradol; other; and adhesive.   Medications: Prior to Admission medications   Medication Sig Start Date End Date Taking? Authorizing Provider  aspirin EC 81 MG tablet Take 1 tablet (81 mg total) by mouth daily. 07/18/15  Yes Alexa Angela Burke, MD  atorvastatin (LIPITOR) 80 MG tablet Take 1 tablet (80 mg total) by mouth daily at 6 PM. 07/18/15  Yes Alexa Angela Burke, MD  cyclobenzaprine (FLEXERIL) 10 MG tablet Take 10 mg by mouth 3 (three) times daily as needed for muscle spasms.    Yes Historical Provider, MD  diazepam (VALIUM) 10 MG tablet Take 10 mg by mouth 2 (two) times daily. For spasms   Yes Historical Provider, MD  DULoxetine (CYMBALTA) 60 MG capsule Take 60 mg by mouth daily.   Yes Historical Provider, MD  lisinopril (PRINIVIL,ZESTRIL) 5 MG tablet Take 1 tablet (5 mg total) by mouth daily. 08/02/15   Yes Scott Joylene Draft, PA-C  meloxicam (MOBIC) 15 MG tablet Take 15 mg by mouth daily.   Yes Historical Provider, MD  metFORMIN (GLUCOPHAGE-XR) 750 MG 24 hr tablet Take 750 mg by mouth daily with supper. 06/07/15  Yes Historical Provider, MD  metoprolol tartrate (LOPRESSOR) 25 MG tablet Take 0.5 tablets (12.5 mg total) by mouth 2 (two) times daily. 07/18/15  Yes Alexa Angela Burke, MD  nitroGLYCERIN (NITROSTAT) 0.4 MG SL tablet Place 1 tablet (0.4 mg total) under the tongue every 5 (five) minutes as needed for chest pain. 07/18/15  Yes Scott Joylene Draft, PA-C  omeprazole (PRILOSEC) 40 MG capsule Take 40 mg by mouth daily.   Yes Historical Provider, MD  Oxycodone HCl 10 MG TABS Take 1 tablet (10 mg total) by mouth every 4 (four) hours. Patient taking differently: Take 10 mg by mouth every 6 (six) hours.  01/07/13  Yes Clydell Hakim, MD  polyethylene glycol powder (GLYCOLAX/MIRALAX) powder Take 17 g by mouth daily as needed (constipation). Mix in 8 oz water, juice, soda, coffee or tea and drink 06/07/15  Yes Historical Provider, MD  pregabalin (LYRICA) 100 MG capsule Take 100 mg by mouth 3 (three) times daily.    Yes Historical Provider, MD  ticagrelor (BRILINTA) 90 MG TABS tablet Take 1 tablet (90 mg total) by mouth 2 (two) times daily. 07/18/15  Yes Alexa Angela Burke, MD  topiramate (TOPAMAX) 50 MG tablet Take 50 mg by mouth 2 (two) times daily as needed (nerve pain). For nerve pain   Yes Historical Provider, MD  traMADol (ULTRAM) 50 MG tablet Take 100 mg by mouth 3 (three) times daily as needed (pain).    Yes Historical Provider, MD    Family History:  Family Status  Relation Status Death Age  . Mother Deceased   . Father Deceased   . Brother Deceased     Social History:   reports that he has quit smoking. His smoking use included Cigarettes. He quit after 15 years of use. He has never used smokeless tobacco. He reports that he drinks alcohol. He reports that he does not use illicit drugs.    Review of  Systems: Other than as noted above, the remainder of the review of systems is normal  Physical Exam: BP 145/92 mmHg  Pulse 62  Temp(Src) 97.7 F (36.5 C) (Oral)  Resp 19  Ht 5\' 10"  (1.778 m)  Wt 100.699 kg (222 lb)  BMI 31.85 kg/m2  SpO2 99% General appearance: Mild to moderately obese black male currently in no acute distress Head: Normocephalic, without obvious abnormality, atraumatic Eyes: conjunctivae/corneas clear. PERRL, EOM's intact. FundiNot examined  Neck: no adenopathy, no carotid bruit, no JVD and supple, symmetrical, trachea midline Lungs: clear  to auscultation bilaterally Heart: regular rate and rhythm, S1, S2 normal, no murmur, click, rub or gallop Abdomen: soft, non-tender; bowel sounds normal; no masses,  no organomegaly Rectal: deferred Extremities: extremities normal, atraumatic, no cyanosis or edema Pulses: 2+ and symmetric Skin: Skin color, texture, turgor normal. No rashes or lesions Neurologic: Grossly normal  Labs: CBC  Recent Labs  08/05/15 2202  WBC 7.6  RBC 4.57  HGB 12.1*  HCT 37.4*  PLT 337  MCV 81.8  MCH 26.5  MCHC 32.4  RDW 16.1*  LYMPHSABS 2.8  MONOABS 0.7  EOSABS 0.3  BASOSABS 0.0   CMP   Recent Labs  08/05/15 2202  NA 140  K 3.2*  CL 108  CO2 22  GLUCOSE 90  BUN 8  CREATININE 0.95  CALCIUM 8.9  GFRNONAA >60  GFRAA >60    Cardiac Panel (last 3 results) Troponin (Point of Care Test)  Recent Labs  08/05/15 2209  TROPIPOC 0.09*   EKG: Prior inferior infarction, inferior T-wave changes noted  Radiology: Shallow inspiration, no acute changes noted   IMPRESSIONS: 1.  Recurrent chest pain suggestive of ischemia with borderline point-of-care troponin 2.  Coronary artery disease with recently presentation of the inferior infarction with drug-eluting stent placed to the right coronary artery 3.  Severe chronic back pain 4.  Hypertension 5.  Diabetes 6.  Hyperlipidemia  PLAN: The patient's pain is suggestive of  ischemic pain.  He will be kept nothing by mouth and may consider repeat catheterization tomorrow.  Check serial enzymes.  Begin IV heparin.  Signed: Kerry Hough MD [facility]  08/06/2015, 1:22 AM

## 2015-08-06 NOTE — Progress Notes (Signed)
ANTICOAGULATION CONSULT NOTE - Initial Consult  Pharmacy Consult for Heparin  Indication: chest pain/ACS  Allergies  Allergen Reactions  . Toradol [Ketorolac Tromethamine] Anaphylaxis    bp dropped, diaphoresis  . Other     Allergic reaction to steroid? Unsure of name  . Adhesive [Tape] Rash and Other (See Comments)    "blisters"    Patient Measurements: Height: 5\' 9"  (175.3 cm) Weight: 217 lb 12.8 oz (98.793 kg) IBW/kg (Calculated) : 70.7  Vital Signs: Temp: 98.2 F (36.8 C) (06/12 0212) Temp Source: Oral (06/11 2211) BP: 146/93 mmHg (06/12 0212) Pulse Rate: 63 (06/12 0212)  Labs:  Recent Labs  08/05/15 2202  HGB 12.1*  HCT 37.4*  PLT 337  CREATININE 0.95    Estimated Creatinine Clearance: 95.8 mL/min (by C-G formula based on Cr of 0.95).   Medical History: Past Medical History  Diagnosis Date  . Arthritis     Rheumatoid  . GERD (gastroesophageal reflux disease)   . Hyperlipidemia   . Hypertension     Dr. [name] [phone]  . Chronic back pain   . 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 Diabetes mellitus     diet controlled  . Cyst (solitary) of breast     ? cyst left calf ,knee  . Baker's cyst     Left calf  . CAD in native artery, 07/15/15 PCI of RCA with DES 07/16/2015    A.   NSTEMI 5/17: LHC - pLAD 20, pLCx 20, OM1 30, pRCA 100, EF 45-50%>> PCI: 2.5 x 24 mm Promus DES to RCA  //  b.   Echo 5/17: mild LVH, EF 50-55%, no RWMA, mod RVE    Assessment: 60 y/o M with chest pain, recent stent, possible cath today, POC troponin with mild elevation, Hgb 12.1, renal function good, other labs reviewed.   Goal of Therapy:  Heparin level 0.3-0.7 units/ml Monitor platelets by anticoagulation protocol: Yes   Plan:  -Heparin 4000 units BOLUS -Start heparin drip at 1200 units/hr -1100 HL -Daily CBC/HL -Monitor for bleeding  Narda Bonds 08/06/2015,2:29 AM

## 2015-08-06 NOTE — Progress Notes (Signed)
ANTICOAGULATION CONSULT NOTE - Follow Up Consult  Pharmacy Consult for Heparin  Indication: chest pain/ACS  Allergies  Allergen Reactions  . Toradol [Ketorolac Tromethamine] Anaphylaxis    bp dropped, diaphoresis  . Other     Allergic reaction to steroid? Unsure of name  . Adhesive [Tape] Rash and Other (See Comments)    "blisters"    Patient Measurements: Height: 5\' 9"  (175.3 cm) Weight: 217 lb 12.8 oz (98.793 kg) IBW/kg (Calculated) : 70.7  Vital Signs: BP: 111/74 mmHg (06/12 2000)  Labs:  Recent Labs  08/05/15 2202 08/06/15 0323 08/06/15 0757 08/06/15 1031 08/06/15 1419 08/06/15 1949  HGB 12.1*  --   --   --   --   --   HCT 37.4*  --   --   --   --   --   PLT 337  --   --   --   --   --   HEPARINUNFRC  --   --   --  0.17*  --  0.23*  CREATININE 0.95  --   --   --   --   --   TROPONINI  --  0.07* 0.07*  --  0.06*  --     Estimated Creatinine Clearance: 95.8 mL/min (by C-G formula based on Cr of 0.95).   Assessment: 60 y/o M admitted with chest pain, recent DES to RCA now on IV heparin drip for cath tomorrow. Heparin drip rate 1400 uts/hr helarin level subtherapeutic 0.23, Hgb 12.1, renal function good, other labs reviewed.   Goal of Therapy:  Heparin level 0.3-0.7 units/ml Monitor platelets by anticoagulation protocol: Yes   Plan:  -Increase heparin rate to 1550 units/hr -recheck heparin level with AML -Daily CBC/HL -Monitor for bleeding  Bonnita Nasuti Pharm.D. CPP, BCPS Clinical Pharmacist [phone] 08/06/2015 8:37 PM

## 2015-08-06 NOTE — Care Management Obs Status (Signed)
[facility] NOTIFICATION   Patient Details  Name: LENDEN SIRBAUGH MRN: [id number] Date of Birth: 18-Mar-1955   Medicare Observation Status Notification Given:  Yes    Bethena Roys, RN 08/06/2015, 11:51 AM

## 2015-08-07 ENCOUNTER — Encounter (HOSPITAL_COMMUNITY): Payer: Self-pay | Admitting: Interventional Cardiology

## 2015-08-07 ENCOUNTER — Encounter (HOSPITAL_COMMUNITY)
Admission: EM | Disposition: A | Discharge status: Home / Self Care | Payer: Self-pay | Source: Home / Self Care | Attending: Cardiology

## 2015-08-07 DIAGNOSIS — I2510 Atherosclerotic heart disease of native coronary artery without angina pectoris: Secondary | ICD-10-CM

## 2015-08-07 DIAGNOSIS — R079 Chest pain, unspecified: Secondary | ICD-10-CM

## 2015-08-07 DIAGNOSIS — E785 Hyperlipidemia, unspecified: Secondary | ICD-10-CM

## 2015-08-07 HISTORY — PX: CARDIAC CATHETERIZATION: SHX172

## 2015-08-07 LAB — PROTIME-INR
INR: 1.04 (ref 0.00–1.49)
Prothrombin Time: 13.8 seconds (ref 11.6–15.2)

## 2015-08-07 LAB — CBC
HCT: 36 % — ABNORMAL LOW (ref 39.0–52.0)
Hemoglobin: 11.3 g/dL — ABNORMAL LOW (ref 13.0–17.0)
MCH: 26 pg (ref 26.0–34.0)
MCHC: 31.4 g/dL (ref 30.0–36.0)
MCV: 82.9 fL (ref 78.0–100.0)
Platelets: 313 10*3/uL (ref 150–400)
RBC: 4.34 MIL/uL (ref 4.22–5.81)
RDW: 16.6 % — ABNORMAL HIGH (ref 11.5–15.5)
WBC: 7.7 10*3/uL (ref 4.0–10.5)

## 2015-08-07 LAB — BASIC METABOLIC PANEL
Anion gap: 7 (ref 5–15)
BUN: 9 mg/dL (ref 6–20)
CO2: 25 mmol/L (ref 22–32)
Calcium: 9.1 mg/dL (ref 8.9–10.3)
Chloride: 108 mmol/L (ref 101–111)
Creatinine, Ser: 1.02 mg/dL (ref 0.61–1.24)
GFR calc Af Amer: >60 mL/min (ref >60)
GFR calc non Af Amer: >60 mL/min (ref >60)
Glucose, Bld: 100 mg/dL — ABNORMAL HIGH (ref 65–99)
Potassium: 3.8 mmol/L (ref 3.5–5.1)
Sodium: 140 mmol/L (ref 135–145)

## 2015-08-07 LAB — HEPARIN LEVEL (UNFRACTIONATED): Heparin Unfractionated: 0.29 IU/mL — ABNORMAL LOW (ref 0.30–0.70)

## 2015-08-07 SURGERY — LEFT HEART CATH AND CORONARY ANGIOGRAPHY
Anesthesia: LOCAL

## 2015-08-07 MED ORDER — HEPARIN SODIUM (PORCINE) 5000 UNIT/ML IJ SOLN
5000.0000 [IU] | Freq: Three times a day (TID) | INTRAMUSCULAR | Status: DC
  Administered 2015-08-07: 5000 [IU] via SUBCUTANEOUS
  Filled 2015-08-07 (×2): qty 1

## 2015-08-07 MED ORDER — SODIUM CHLORIDE 0.9% FLUSH
3.0000 mL | Freq: Two times a day (BID) | INTRAVENOUS | Status: DC
  Administered 2015-08-07 – 2015-08-08 (×2): 3 mL via INTRAVENOUS

## 2015-08-07 MED ORDER — ACETAMINOPHEN 325 MG PO TABS
650.0000 mg | ORAL_TABLET | ORAL | Status: DC | PRN
  Administered 2015-08-07: 650 mg via ORAL
  Filled 2015-08-07: qty 2

## 2015-08-07 MED ORDER — FENTANYL CITRATE (PF) 100 MCG/2ML IJ SOLN
INTRAMUSCULAR | Status: AC
  Filled 2015-08-07: qty 2

## 2015-08-07 MED ORDER — ONDANSETRON HCL 4 MG/2ML IJ SOLN: 4.0000 mg | Freq: Four times a day (QID) | INTRAMUSCULAR | Status: DC | PRN

## 2015-08-07 MED ORDER — HEPARIN (PORCINE) IN NACL 2-0.9 UNIT/ML-% IJ SOLN
INTRAMUSCULAR | Status: AC
  Filled 2015-08-07: qty 1000

## 2015-08-07 MED ORDER — LIDOCAINE HCL (PF) 1 % IJ SOLN
INTRAMUSCULAR | Status: AC
  Filled 2015-08-07: qty 30

## 2015-08-07 MED ORDER — HEPARIN SODIUM (PORCINE) 1000 UNIT/ML IJ SOLN
INTRAMUSCULAR | Status: AC
  Filled 2015-08-07: qty 1

## 2015-08-07 MED ORDER — LIDOCAINE HCL (PF) 1 % IJ SOLN
INTRAMUSCULAR | Status: DC | PRN
  Administered 2015-08-07: 2 mL via SUBCUTANEOUS

## 2015-08-07 MED ORDER — IOPAMIDOL (ISOVUE-370) INJECTION 76%
INTRAVENOUS | Status: DC | PRN
  Administered 2015-08-07: 105 mL via INTRA_ARTERIAL

## 2015-08-07 MED ORDER — SODIUM CHLORIDE 0.9 % WEIGHT BASED INFUSION
1.0000 mL/kg/h | INTRAVENOUS | Status: AC
  Administered 2015-08-07: 1 mL/kg/h via INTRAVENOUS

## 2015-08-07 MED ORDER — SODIUM CHLORIDE 0.9 % IV SOLN: 250.0000 mL | INTRAVENOUS | Status: DC | PRN

## 2015-08-07 MED ORDER — FENTANYL CITRATE (PF) 100 MCG/2ML IJ SOLN
INTRAMUSCULAR | Status: DC | PRN
  Administered 2015-08-07: 50 ug via INTRAVENOUS

## 2015-08-07 MED ORDER — SODIUM CHLORIDE 0.9% FLUSH: 3.0000 mL | INTRAVENOUS | Status: DC | PRN

## 2015-08-07 MED ORDER — VERAPAMIL HCL 2.5 MG/ML IV SOLN
INTRAVENOUS | Status: DC | PRN
  Administered 2015-08-07: 10 mL via INTRA_ARTERIAL

## 2015-08-07 MED ORDER — MIDAZOLAM HCL 2 MG/2ML IJ SOLN
INTRAMUSCULAR | Status: DC | PRN
  Administered 2015-08-07: 1 mg via INTRAVENOUS

## 2015-08-07 MED ORDER — OXYCODONE-ACETAMINOPHEN 5-325 MG PO TABS: 1.0000 | ORAL_TABLET | ORAL | Status: DC | PRN

## 2015-08-07 MED ORDER — VERAPAMIL HCL 2.5 MG/ML IV SOLN
INTRAVENOUS | Status: AC
  Filled 2015-08-07: qty 2

## 2015-08-07 MED ORDER — HEPARIN SODIUM (PORCINE) 1000 UNIT/ML IJ SOLN
INTRAMUSCULAR | Status: DC | PRN
  Administered 2015-08-07: 4500 [IU] via INTRAVENOUS

## 2015-08-07 MED ORDER — MIDAZOLAM HCL 2 MG/2ML IJ SOLN
INTRAMUSCULAR | Status: AC
  Filled 2015-08-07: qty 2

## 2015-08-07 MED ORDER — IOPAMIDOL (ISOVUE-370) INJECTION 76%
INTRAVENOUS | Status: AC
  Filled 2015-08-07: qty 100

## 2015-08-07 SURGICAL SUPPLY — 11 items
CATH INFINITI 5 FR JL3.5 (CATHETERS) ×1 IMPLANT
CATH INFINITI 5FR ANG PIGTAIL (CATHETERS) ×1 IMPLANT
CATH INFINITI JR4 5F (CATHETERS) ×1 IMPLANT
DEVICE RAD COMP TR BAND LRG (VASCULAR PRODUCTS) ×1 IMPLANT
GLIDESHEATH SLEND A-KIT 6F 22G (SHEATH) ×1 IMPLANT
KIT HEART LEFT (KITS) ×2 IMPLANT
PACK CARDIAC CATHETERIZATION (CUSTOM PROCEDURE TRAY) ×2 IMPLANT
SYR MEDRAD MARK V 150ML (SYRINGE) ×1 IMPLANT
TRANSDUCER W/STOPCOCK (MISCELLANEOUS) ×2 IMPLANT
TUBING CIL FLEX 10 FLL-RA (TUBING) ×2 IMPLANT
WIRE SAFE-T 1.5MM-J .035X260CM (WIRE) ×2 IMPLANT

## 2015-08-07 NOTE — Care Management Important Message (Signed)
Important Message  Patient Details  Name: Thomas Mcclure MRN: [id number] Date of Birth: October 25, 1955   Medicare Important Message Given:  Yes    Loann Quill 08/07/2015, 8:46 AM

## 2015-08-07 NOTE — Progress Notes (Signed)
Paged PA to find out plan for evening and if patient was able to go home after band removal. PA verbal order to stop nitro gtt and see if chest pain comes back, if so they may start IMDUR. ECHO also ordered per PA. Nitro discontinued. Will continue to monitor closely.

## 2015-08-07 NOTE — Interval H&P Note (Signed)
Cath Lab Visit (complete for each Cath Lab visit)  Clinical Evaluation Leading to the Procedure:   ACS: Yes.    Non-ACS:    Anginal Classification: CCS III  Anti-ischemic medical therapy: Minimal Therapy (1 class of medications)  Non-Invasive Test Results: No non-invasive testing performed  Prior CABG: No previous CABG          History and Physical Interval Note:  08/07/2015 11:46 AM  [name]  has presented today for surgery, with the diagnosis of unstable angina  The various methods of treatment have been discussed with the patient and family. After consideration of risks, benefits and other options for treatment, the patient has consented to  Procedure(s): Left Heart Cath and Coronary Angiography (N/A) as a surgical intervention .  The patient's history has been reviewed, patient examined, no change in status, stable for surgery.  I have reviewed the patient's chart and labs.  Questions were answered to the patient's satisfaction.     [name] III

## 2015-08-07 NOTE — Progress Notes (Signed)
Patient Name: Thomas Mcclure Date of Encounter: 08/07/2015   SUBJECTIVE  Still having intermittent CP and SOB on IV nitro.   CURRENT MEDS . antiseptic oral rinse  7 mL Mouth Rinse BID  . aspirin EC  81 mg Oral Daily  . atorvastatin  80 mg Oral q1800  . DULoxetine  60 mg Oral Daily  . lisinopril  5 mg Oral Daily  . meloxicam  15 mg Oral Q breakfast  . metoprolol tartrate  12.5 mg Oral BID  . oxyCODONE  10 mg Oral TID AC & HS  . pantoprazole  40 mg Oral Daily  . pregabalin  100 mg Oral TID  . sodium chloride flush  3 mL Intravenous Q12H  . ticagrelor  90 mg Oral BID    OBJECTIVE  Filed Vitals:   08/07/15 0500 08/07/15 0552 08/07/15 0600 08/07/15 0822  BP: 118/93 125/95 114/97 141/94  Pulse: 63 69 68 78  Temp: 97.5 F (36.4 C)   97.9 F (36.6 C)  TempSrc: Oral   Oral  Resp: 11 13 14 18   Height:    5' 5.5" (1.664 m)  Weight: 220 lb 8 oz (100.018 kg)   185 lb (83.915 kg)  SpO2: 100% 100% 100% 100%    Intake/Output Summary (Last 24 hours) at 08/07/15 0844 Last data filed at 08/07/15 0600  Gross per 24 hour  Intake 703.57 ml  Output   1525 ml  Net -821.43 ml   Filed Weights   08/06/15 0212 08/07/15 0500 08/07/15 0822  Weight: 217 lb 12.8 oz (98.793 kg) 220 lb 8 oz (100.018 kg) 185 lb (83.915 kg)    PHYSICAL EXAM  General: Pleasant, NAD. Neuro: Alert and oriented X 3. Moves all extremities spontaneously. Psych: Normal affect. HEENT:  Normal  Neck: Supple without bruits or JVD. Lungs:  Resp regular and unlabored, CTA. Heart: RRR no s3, s4, or murmurs. Abdomen: Soft, non-tender, non-distended, BS + x 4.  Extremities: No clubbing, cyanosis or edema. DP/PT/Radials 2+ and equal bilaterally.  Accessory Clinical Findings  CBC  Recent Labs  08/05/15 2202 08/07/15 0405  WBC 7.6 7.7  NEUTROABS 3.8  --   HGB 12.1* 11.3*  HCT 37.4* 36.0*  MCV 81.8 82.9  PLT 337 [id number]   Basic Metabolic Panel  Recent Labs  08/05/15 2202 08/07/15 0405  NA 140 140  K  3.2* 3.8  CL 108 108  CO2 22 25  GLUCOSE 90 100*  BUN 8 9  CREATININE 0.95 1.02  CALCIUM 8.9 9.1   Liver Function Tests No results for input(s): AST, ALT, ALKPHOS, BILITOT, PROT, ALBUMIN in the last 72 hours. No results for input(s): LIPASE, AMYLASE in the last 72 hours. Cardiac Enzymes  Recent Labs  08/06/15 0323 08/06/15 0757 08/06/15 1419  TROPONINI 0.07* 0.07* 0.06*    TELE  Sinus rhythm  Radiology/Studies  Dg Chest 2 View  07/15/2015  CLINICAL DATA:  Chest pain, back pain, and right shoulder pain. Injury while mowing the lawn. EXAM: CHEST  2 VIEW COMPARISON:  04/16/2012 FINDINGS: Linear atelectasis in the lung bases. No focal airspace disease or consolidation. No blunting of costophrenic angles. No pneumothorax. Normal heart size and pulmonary vascularity. Tortuous aorta. Spinal stimulator leads in the mid thoracic region. IMPRESSION: Shallow inspiration with atelectasis in the lung bases. Electronically Signed   By: Lucienne Capers M.D.   On: 07/15/2015 04:07   Dg Chest Portable 1 View  08/05/2015  CLINICAL DATA:  7 p.m. today, onset right sternal  chest pain and diaphoresis. EXAM: PORTABLE CHEST 1 VIEW COMPARISON:  07/15/2015 FINDINGS: Shallow inspiration. Heart size and pulmonary vascularity are normal. No focal airspace disease or consolidation. No blunting of costophrenic angles. No pneumothorax. Mediastinal contours appear intact. Stimulator leads projected over the mid thoracic spine. IMPRESSION: No active disease. Electronically Signed   By: Lucienne Capers M.D.   On: 08/05/2015 22:52   Ct Angio Chest/abd/pel For Dissection W And/or W/wo  07/15/2015  CLINICAL DATA:  Chest pain and shortness of breath. Assess for dissection. History of hypertension, hyperlipidemia, chronic back pain, neuromuscular disorder, diabetes. EXAM: CT ANGIOGRAPHY CHEST, ABDOMEN AND PELVIS TECHNIQUE: Multidetector CT imaging through the chest, abdomen and pelvis was performed using the standard  protocol during bolus administration of intravenous contrast. Multiplanar reconstructed images and MIPs were obtained and reviewed to evaluate the vascular anatomy. CONTRAST:  100 cc Isovue 370 COMPARISON:  Chest radiograph Jul 15, 2015 at 0320 hours FINDINGS: CTA CHEST FINDINGS ARTERIES: Thoracic aorta is normal course and caliber. Mild calcific atherosclerosis. No intrinsic density on noncontrast CT. Homogeneous contrast opacification of thoracic aorta without dissection, aneurysm, luminal irregularity, periaortic fluid collections, or contrast extravasation. MEDIASTINUM: The heart is mildly enlarged. Moderate coronary artery calcifications. No pericardial fluid collection. LUNGS: Tracheobronchial tree is patent, no pneumothorax. Mild bronchial wall thickening. Mild apical bullous changes. Bibasilar dependent atelectasis. 2 mm sub solid subpleural RIGHT pulmonary nodule is likely benign. No pleural effusions, focal consolidations, pulmonary nodules or masses. SOFT TISSUES AND OSSEOUS STRUCTURES: Nonsuspicious. Spinal stimulator leads via T12-L1 interlaminar approach with lead tips at T6. Review of the MIP images confirms the above findings. Re CTA ABDOMEN AND PELVIS FINDINGS ARTERIES: Abdominal aorta is normal course and caliber. Homogeneous contrast opacification of aortoiliac vessels without dissection, aneurysm, luminal irregularity, periaortic fluid collections, or contrast extravasation. Moderate calcific atherosclerosis in intimal thickening. Celiac axis, superior and inferior mesenteric arteries are normal. Ectatic LEFT Common iliac artery to 16 mm. Accessory bilateral renal arteries. SOLID ORGANS: The liver, spleen, gallbladder, pancreas are unremarkable. Nodular thickening of the bilateral adrenal glands most compatible with hyperplasia. GASTROINTESTINAL TRACT: The stomach, small and large bowel are normal in course and caliber without inflammatory changes. Moderate amount of retained large bowel stool.  Normal appendix. KIDNEYS/ URINARY TRACT: Kidneys are orthotopic, demonstrating symmetric enhancement. No nephrolithiasis, hydronephrosis or solid renal masses. The unopacified ureters are normal in course and caliber. Urinary bladder is well distended and unremarkable. PERITONEUM/RETROPERITONEUM: No intraperitoneal free fluid nor free air. Aortoiliac vessels are normal in course and caliber. No lymphadenopathy by CT size criteria. Prostate is mildly enlarged. SOFT TISSUE/OSSEOUS STRUCTURES: Nonsuspicious. Moderate RIGHT and small LEFT fat containing inguinal hernias. Battery pack RIGHT gluteus subcutaneous fat. Status post L4 and L5 laminectomies. L3 through S1 pedicle screws and bridging rods with incorporated posterior bone graft material. Mild lucency of the RIGHT S1 screw. Severe LEFT greater than RIGHT L5-S1 neural foraminal narrowing. Non incorporated L5-S1 interbody fusion material. Moderate degenerative change of the RIGHT hip. Bridging LEFT sacroiliac osteophyte. Review of the MIP images confirms the above findings. IMPRESSION: CTA CHEST: No acute vascular process. Mild bronchial wall thickening can be seen with reactive airway disease or bronchitis. Mild cardiomegaly. CTA ABDOMEN AND PELVIS: No acute vascular process or acute intra-abdominal/pelvic process. Status post L3 through S1 PLIF, with findings of L5-S1 hardware failure. Electronically Signed   By: Elon Alas M.D.   On: 07/15/2015 04:58    ASSESSMENT AND PLAN   1. Unstable angina pectoris (HCC) - No improvement on SL nitro.  Troponin flat trend. Continue heparin. For cath today. Scr and lytes normal.   2. Recent STEMI - Occluded RCA was treated with thrombectomy and DES. Continue ASA, brillinta, statin, ACE and BB.   3. HTN - Stable and controlled   4. HL - Continue statin.   5. DM - Held metformin. Start SSI.   Jarrett Soho PA-C Pager [phone]  Attending Note:   The patient was seen and examined.   Agree with assessment and plan as noted above.  Changes made to the above note as needed.  Patient seen and independently examined with Robbie Lis, PA .   We discussed all aspects of the encounter. I agree with the assessment and plan as stated above.  Going for cath today . Had a recent stenting of his RCA Now presents with similar pain . For cath  Later this am     I have spent a total of 40 minutes with patient reviewing hospital  notes , telemetry, EKGs, labs and examining patient as well as establishing an assessment and plan that was discussed with the patient. > 50% of time was spent in direct patient care.    Thayer Headings, Brooke Bonito., MD, [facility] 08/07/2015, 10:26 AM [address],  [place] Pager [phone]

## 2015-08-07 NOTE — Progress Notes (Signed)
ANTICOAGULATION CONSULT NOTE - Follow Up Consult  Pharmacy Consult for Heparin  Indication: chest pain/ACS  Allergies  Allergen Reactions  . Toradol [Ketorolac Tromethamine] Anaphylaxis    bp dropped, diaphoresis  . Other     Allergic reaction to steroid? Unsure of name  . Adhesive [Tape] Rash and Other (See Comments)    "blisters"    Patient Measurements: Height: 5' 5.5" (166.4 cm) Weight: 185 lb (83.915 kg) IBW/kg (Calculated) : 62.65  Vital Signs: Temp: 97.9 F (36.6 C) (06/13 0822) Temp Source: Oral (06/13 0822) BP: 141/94 mmHg (06/13 0822) Pulse Rate: 78 (06/13 0822)  Labs:  Recent Labs  08/05/15 2202 08/06/15 0323 08/06/15 0757 08/06/15 1031 08/06/15 1419 08/06/15 1949 08/07/15 0405  HGB 12.1*  --   --   --   --   --  11.3*  HCT 37.4*  --   --   --   --   --  36.0*  PLT 337  --   --   --   --   --  313  HEPARINUNFRC  --   --   --  0.17*  --  0.23* 0.29*  CREATININE 0.95  --   --   --   --   --  1.02  TROPONINI  --  0.07* 0.07*  --  0.06*  --   --     Estimated Creatinine Clearance: 77.6 mL/min (by C-G formula based on Cr of 1.02).   Assessment: 60 y/o M admitted with chest pain, recent DES to RCA now on IV heparin drip for cath today at noon. Heparin level = 0.29 this morning on 1550 units/hr. CBC stabl  Goal of Therapy:  Heparin level 0.3-0.7 units/ml Monitor platelets by anticoagulation protocol: Yes   Plan:  -Continue heparin 1550 units/hr -will f/u after cath  Maryanna Shape, PharmD, BCPS  Clinical Pharmacist  Pager: [phone]   08/07/2015 11:13 AM

## 2015-08-07 NOTE — Clinical Documentation Improvement (Signed)
Cardiology   Please document query responses in the progress notes and discharge summary, not on the CDI BPA form in [facility].  Patient admitted with chest pain, known history of CAD and recent NSTEMI, underwent repeat Left Heart Cath 08/07/15.  RCA stent patent, other coronary artery disease unchanged.  EF at cath was 35-40%.  LVEDP 23 mm/Hg per Dr. Thompson Caul documentation in the conclusion of the cath report.  Home medications include:  Lisinopril;  Lopressor.  Please document if a condition below provides greater specificity regarding the patient's chest pain:  - Acute on chronic systolic heart failure  - Other type of Heart Failure  - Other condition  - Unable to clinically determine   Please exercise your independent, professional judgment when responding. A specific answer is not anticipated or expected.   Thank You, Erling Conte  RN BSN CCDS [phone] [facility] [place]

## 2015-08-07 NOTE — H&P (View-Only) (Signed)
Patient Name: Thomas Mcclure Date of Encounter: 08/07/2015   SUBJECTIVE  Still having intermittent CP and SOB on IV nitro.   CURRENT MEDS . antiseptic oral rinse  7 mL Mouth Rinse BID  . aspirin EC  81 mg Oral Daily  . atorvastatin  80 mg Oral q1800  . DULoxetine  60 mg Oral Daily  . lisinopril  5 mg Oral Daily  . meloxicam  15 mg Oral Q breakfast  . metoprolol tartrate  12.5 mg Oral BID  . oxyCODONE  10 mg Oral TID AC & HS  . pantoprazole  40 mg Oral Daily  . pregabalin  100 mg Oral TID  . sodium chloride flush  3 mL Intravenous Q12H  . ticagrelor  90 mg Oral BID    OBJECTIVE  Filed Vitals:   08/07/15 0500 08/07/15 0552 08/07/15 0600 08/07/15 0822  BP: 118/93 125/95 114/97 141/94  Pulse: 63 69 68 78  Temp: 97.5 F (36.4 C)   97.9 F (36.6 C)  TempSrc: Oral   Oral  Resp: 11 13 14 18   Height:    5' 5.5" (1.664 m)  Weight: 220 lb 8 oz (100.018 kg)   185 lb (83.915 kg)  SpO2: 100% 100% 100% 100%    Intake/Output Summary (Last 24 hours) at 08/07/15 0844 Last data filed at 08/07/15 0600  Gross per 24 hour  Intake 703.57 ml  Output   1525 ml  Net -821.43 ml   Filed Weights   08/06/15 0212 08/07/15 0500 08/07/15 0822  Weight: 217 lb 12.8 oz (98.793 kg) 220 lb 8 oz (100.018 kg) 185 lb (83.915 kg)    PHYSICAL EXAM  General: Pleasant, NAD. Neuro: Alert and oriented X 3. Moves all extremities spontaneously. Psych: Normal affect. HEENT:  Normal  Neck: Supple without bruits or JVD. Lungs:  Resp regular and unlabored, CTA. Heart: RRR no s3, s4, or murmurs. Abdomen: Soft, non-tender, non-distended, BS + x 4.  Extremities: No clubbing, cyanosis or edema. DP/PT/Radials 2+ and equal bilaterally.  Accessory Clinical Findings  CBC  Recent Labs  08/05/15 2202 08/07/15 0405  WBC 7.6 7.7  NEUTROABS 3.8  --   HGB 12.1* 11.3*  HCT 37.4* 36.0*  MCV 81.8 82.9  PLT 337 [id number]   Basic Metabolic Panel  Recent Labs  08/05/15 2202 08/07/15 0405  NA 140 140  K  3.2* 3.8  CL 108 108  CO2 22 25  GLUCOSE 90 100*  BUN 8 9  CREATININE 0.95 1.02  CALCIUM 8.9 9.1   Liver Function Tests No results for input(s): AST, ALT, ALKPHOS, BILITOT, PROT, ALBUMIN in the last 72 hours. No results for input(s): LIPASE, AMYLASE in the last 72 hours. Cardiac Enzymes  Recent Labs  08/06/15 0323 08/06/15 0757 08/06/15 1419  TROPONINI 0.07* 0.07* 0.06*    TELE  Sinus rhythm  Radiology/Studies  Dg Chest 2 View  07/15/2015  CLINICAL DATA:  Chest pain, back pain, and right shoulder pain. Injury while mowing the lawn. EXAM: CHEST  2 VIEW COMPARISON:  04/16/2012 FINDINGS: Linear atelectasis in the lung bases. No focal airspace disease or consolidation. No blunting of costophrenic angles. No pneumothorax. Normal heart size and pulmonary vascularity. Tortuous aorta. Spinal stimulator leads in the mid thoracic region. IMPRESSION: Shallow inspiration with atelectasis in the lung bases. Electronically Signed   By: Lucienne Capers M.D.   On: 07/15/2015 04:07   Dg Chest Portable 1 View  08/05/2015  CLINICAL DATA:  7 p.m. today, onset right sternal  chest pain and diaphoresis. EXAM: PORTABLE CHEST 1 VIEW COMPARISON:  07/15/2015 FINDINGS: Shallow inspiration. Heart size and pulmonary vascularity are normal. No focal airspace disease or consolidation. No blunting of costophrenic angles. No pneumothorax. Mediastinal contours appear intact. Stimulator leads projected over the mid thoracic spine. IMPRESSION: No active disease. Electronically Signed   By: Lucienne Capers M.D.   On: 08/05/2015 22:52   Ct Angio Chest/abd/pel For Dissection W And/or W/wo  07/15/2015  CLINICAL DATA:  Chest pain and shortness of breath. Assess for dissection. History of hypertension, hyperlipidemia, chronic back pain, neuromuscular disorder, diabetes. EXAM: CT ANGIOGRAPHY CHEST, ABDOMEN AND PELVIS TECHNIQUE: Multidetector CT imaging through the chest, abdomen and pelvis was performed using the standard  protocol during bolus administration of intravenous contrast. Multiplanar reconstructed images and MIPs were obtained and reviewed to evaluate the vascular anatomy. CONTRAST:  100 cc Isovue 370 COMPARISON:  Chest radiograph Jul 15, 2015 at 0320 hours FINDINGS: CTA CHEST FINDINGS ARTERIES: Thoracic aorta is normal course and caliber. Mild calcific atherosclerosis. No intrinsic density on noncontrast CT. Homogeneous contrast opacification of thoracic aorta without dissection, aneurysm, luminal irregularity, periaortic fluid collections, or contrast extravasation. MEDIASTINUM: The heart is mildly enlarged. Moderate coronary artery calcifications. No pericardial fluid collection. LUNGS: Tracheobronchial tree is patent, no pneumothorax. Mild bronchial wall thickening. Mild apical bullous changes. Bibasilar dependent atelectasis. 2 mm sub solid subpleural RIGHT pulmonary nodule is likely benign. No pleural effusions, focal consolidations, pulmonary nodules or masses. SOFT TISSUES AND OSSEOUS STRUCTURES: Nonsuspicious. Spinal stimulator leads via T12-L1 interlaminar approach with lead tips at T6. Review of the MIP images confirms the above findings. Re CTA ABDOMEN AND PELVIS FINDINGS ARTERIES: Abdominal aorta is normal course and caliber. Homogeneous contrast opacification of aortoiliac vessels without dissection, aneurysm, luminal irregularity, periaortic fluid collections, or contrast extravasation. Moderate calcific atherosclerosis in intimal thickening. Celiac axis, superior and inferior mesenteric arteries are normal. Ectatic LEFT Common iliac artery to 16 mm. Accessory bilateral renal arteries. SOLID ORGANS: The liver, spleen, gallbladder, pancreas are unremarkable. Nodular thickening of the bilateral adrenal glands most compatible with hyperplasia. GASTROINTESTINAL TRACT: The stomach, small and large bowel are normal in course and caliber without inflammatory changes. Moderate amount of retained large bowel stool.  Normal appendix. KIDNEYS/ URINARY TRACT: Kidneys are orthotopic, demonstrating symmetric enhancement. No nephrolithiasis, hydronephrosis or solid renal masses. The unopacified ureters are normal in course and caliber. Urinary bladder is well distended and unremarkable. PERITONEUM/RETROPERITONEUM: No intraperitoneal free fluid nor free air. Aortoiliac vessels are normal in course and caliber. No lymphadenopathy by CT size criteria. Prostate is mildly enlarged. SOFT TISSUE/OSSEOUS STRUCTURES: Nonsuspicious. Moderate RIGHT and small LEFT fat containing inguinal hernias. Battery pack RIGHT gluteus subcutaneous fat. Status post L4 and L5 laminectomies. L3 through S1 pedicle screws and bridging rods with incorporated posterior bone graft material. Mild lucency of the RIGHT S1 screw. Severe LEFT greater than RIGHT L5-S1 neural foraminal narrowing. Non incorporated L5-S1 interbody fusion material. Moderate degenerative change of the RIGHT hip. Bridging LEFT sacroiliac osteophyte. Review of the MIP images confirms the above findings. IMPRESSION: CTA CHEST: No acute vascular process. Mild bronchial wall thickening can be seen with reactive airway disease or bronchitis. Mild cardiomegaly. CTA ABDOMEN AND PELVIS: No acute vascular process or acute intra-abdominal/pelvic process. Status post L3 through S1 PLIF, with findings of L5-S1 hardware failure. Electronically Signed   By: Elon Alas M.D.   On: 07/15/2015 04:58    ASSESSMENT AND PLAN   1. Unstable angina pectoris (HCC) - No improvement on SL nitro.  Troponin flat trend. Continue heparin. For cath today. Scr and lytes normal.   2. Recent STEMI - Occluded RCA was treated with thrombectomy and DES. Continue ASA, brillinta, statin, ACE and BB.   3. HTN - Stable and controlled   4. HL - Continue statin.   5. DM - Held metformin. Start SSI.   Jarrett Soho PA-C Pager [phone]  Attending Note:   The patient was seen and examined.   Agree with assessment and plan as noted above.  Changes made to the above note as needed.  Patient seen and independently examined with Robbie Lis, PA .   We discussed all aspects of the encounter. I agree with the assessment and plan as stated above.  Going for cath today . Had a recent stenting of his RCA Now presents with similar pain . For cath  Later this am     I have spent a total of 40 minutes with patient reviewing hospital  notes , telemetry, EKGs, labs and examining patient as well as establishing an assessment and plan that was discussed with the patient. > 50% of time was spent in direct patient care.    Thayer Headings, Brooke Bonito., MD, [facility] 08/07/2015, 10:26 AM [address],  [place] Pager [phone]

## 2015-08-08 ENCOUNTER — Other Ambulatory Visit (HOSPITAL_COMMUNITY): Payer: Medicare Other

## 2015-08-08 DIAGNOSIS — I5023 Acute on chronic systolic (congestive) heart failure: Secondary | ICD-10-CM

## 2015-08-08 LAB — CBC
HCT: 37.7 % — ABNORMAL LOW (ref 39.0–52.0)
Hemoglobin: 11.9 g/dL — ABNORMAL LOW (ref 13.0–17.0)
MCH: 26 pg (ref 26.0–34.0)
MCHC: 31.6 g/dL (ref 30.0–36.0)
MCV: 82.3 fL (ref 78.0–100.0)
Platelets: 275 10*3/uL (ref 150–400)
RBC: 4.58 MIL/uL (ref 4.22–5.81)
RDW: 16.5 % — ABNORMAL HIGH (ref 11.5–15.5)
WBC: 6.1 10*3/uL (ref 4.0–10.5)

## 2015-08-08 LAB — BASIC METABOLIC PANEL
Anion gap: 5 (ref 5–15)
BUN: 7 mg/dL (ref 6–20)
CO2: 29 mmol/L (ref 22–32)
Calcium: 8.9 mg/dL (ref 8.9–10.3)
Chloride: 106 mmol/L (ref 101–111)
Creatinine, Ser: 1.1 mg/dL (ref 0.61–1.24)
GFR calc Af Amer: >60 mL/min (ref >60)
GFR calc non Af Amer: >60 mL/min (ref >60)
Glucose, Bld: 102 mg/dL — ABNORMAL HIGH (ref 65–99)
Potassium: 3.6 mmol/L (ref 3.5–5.1)
Sodium: 140 mmol/L (ref 135–145)

## 2015-08-08 MED ORDER — FUROSEMIDE 40 MG PO TABS
40.0000 mg | ORAL_TABLET | Freq: Every day | ORAL | Status: DC
  Administered 2015-08-08: 40 mg via ORAL
  Filled 2015-08-08: qty 1

## 2015-08-08 MED ORDER — POTASSIUM CHLORIDE CRYS ER 10 MEQ PO TBCR
10.0000 meq | EXTENDED_RELEASE_TABLET | Freq: Every day | ORAL | Status: DC
  Administered 2015-08-08: 10 meq via ORAL
  Filled 2015-08-08: qty 1

## 2015-08-08 MED ORDER — POTASSIUM CHLORIDE ER 10 MEQ PO TBCR
10.0000 meq | EXTENDED_RELEASE_TABLET | Freq: Once | ORAL | Status: DC
Start: 2015-08-08 — End: 2015-08-26

## 2015-08-08 MED ORDER — FUROSEMIDE 40 MG PO TABS
40.0000 mg | ORAL_TABLET | Freq: Every day | ORAL | Status: DC
Start: 2015-08-08 — End: 2015-12-05

## 2015-08-08 MED FILL — Heparin Sodium (Porcine) 2 Unit/ML in Sodium Chloride 0.9%: INTRAMUSCULAR | Qty: 500 | Status: AC

## 2015-08-08 NOTE — Progress Notes (Signed)
Patient Name: Thomas Mcclure Date of Encounter: 08/08/2015   SUBJECTIVE  No further chest pain or sob.   CURRENT MEDS . antiseptic oral rinse  7 mL Mouth Rinse BID  . aspirin EC  81 mg Oral Daily  . atorvastatin  80 mg Oral q1800  . DULoxetine  60 mg Oral Daily  . heparin  5,000 Units Subcutaneous Q8H  . lisinopril  5 mg Oral Daily  . meloxicam  15 mg Oral Q breakfast  . metoprolol tartrate  12.5 mg Oral BID  . oxyCODONE  10 mg Oral TID AC & HS  . pantoprazole  40 mg Oral Daily  . pregabalin  100 mg Oral TID  . sodium chloride flush  3 mL Intravenous Q12H  . ticagrelor  90 mg Oral BID    OBJECTIVE  Filed Vitals:   08/07/15 2000 08/08/15 0500 08/08/15 0831 08/08/15 0832  BP: 140/95 129/104 129/104 129/104  Pulse: 61 70 69   Temp: 98 F (36.7 C) 98.1 F (36.7 C)    TempSrc: Oral Oral    Resp: 18 18    Height:      Weight:  182 lb 9.6 oz (82.827 kg)    SpO2: 96% 96%      Intake/Output Summary (Last 24 hours) at 08/08/15 1021 Last data filed at 08/08/15 0900  Gross per 24 hour  Intake 1566.66 ml  Output   1550 ml  Net  16.66 ml   Filed Weights   08/07/15 0500 08/07/15 0822 08/08/15 0500  Weight: 220 lb 8 oz (100.018 kg) 185 lb (83.915 kg) 182 lb 9.6 oz (82.827 kg)    PHYSICAL EXAM  General: Pleasant, NAD. Neuro: Alert and oriented X 3. Moves all extremities spontaneously. Psych: Normal affect. HEENT:  Normal  Neck: Supple without bruits or JVD. Lungs:  Resp regular and unlabored, CTA. Heart: RRR no s3, s4, or murmurs. Abdomen: Soft, non-tender, non-distended, BS + x 4.  Extremities: No clubbing, cyanosis or edema. DP/PT/Radials 2+ and equal bilaterally. R radial cath site without hematoma or buise.   Accessory Clinical Findings  CBC  Recent Labs  08/05/15 2202 08/07/15 0405 08/08/15 0441  WBC 7.6 7.7 6.1  NEUTROABS 3.8  --   --   HGB 12.1* 11.3* 11.9*  HCT 37.4* 36.0* 37.7*  MCV 81.8 82.9 82.3  PLT 337 313 [id number]   Basic Metabolic  Panel  Recent Labs  08/07/15 0405 08/08/15 0441  NA 140 140  K 3.8 3.6  CL 108 106  CO2 25 29  GLUCOSE 100* 102*  BUN 9 7  CREATININE 1.02 1.10  CALCIUM 9.1 8.9   Liver Function Tests No results for input(s): AST, ALT, ALKPHOS, BILITOT, PROT, ALBUMIN in the last 72 hours. No results for input(s): LIPASE, AMYLASE in the last 72 hours. Cardiac Enzymes  Recent Labs  08/06/15 0323 08/06/15 0757 08/06/15 1419  TROPONINI 0.07* 0.07* 0.06*    TELE  Sinus rhythm   Cath 07/2015 Left Heart Cath and Coronary Angiography    Conclusion     Prox LAD to Mid LAD lesion, 20% stenosed.  Prox Cx to Mid Cx lesion, 20% stenosed.  Ost 1st Mrg to 1st Mrg lesion, 50% stenosed.   Widely patent right coronary artery including recently placed proximal stent at the time of acute coronary intervention.  Irregularities noted in the proximal and mid LAD as well as circumflex. There is up to 50% stenosis in the proximal segment of the large first obtuse marginal.  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 Overall LV function is moderately decreased with an EF of 35-40%. LVEDP 23 mmHg.  No obvious explanation for the patient's ongoing chest pain complaints.     Radiology/Studies  Dg Chest 2 View  07/15/2015  CLINICAL DATA:  Chest pain, back pain, and right shoulder pain. Injury while mowing the lawn. EXAM: CHEST  2 VIEW COMPARISON:  04/16/2012 FINDINGS: Linear atelectasis in the lung bases. No focal airspace disease or consolidation. No blunting of costophrenic angles. No pneumothorax. Normal heart size and pulmonary vascularity. Tortuous aorta. Spinal stimulator leads in the mid thoracic region. IMPRESSION: Shallow inspiration with atelectasis in the lung bases. Electronically Signed   By: Lucienne Capers M.D.   On: 07/15/2015 04:07   Dg Chest Portable 1 View  08/05/2015  CLINICAL DATA:  7 p.m. today, onset right sternal chest pain and diaphoresis. EXAM: PORTABLE CHEST  1 VIEW COMPARISON:  07/15/2015 FINDINGS: Shallow inspiration. Heart size and pulmonary vascularity are normal. No focal airspace disease or consolidation. No blunting of costophrenic angles. No pneumothorax. Mediastinal contours appear intact. Stimulator leads projected over the mid thoracic spine. IMPRESSION: No active disease. Electronically Signed   By: Lucienne Capers M.D.   On: 08/05/2015 22:52   Ct Angio Chest/abd/pel For Dissection W And/or W/wo  07/15/2015  CLINICAL DATA:  Chest pain and shortness of breath. Assess for dissection. History of hypertension, hyperlipidemia, chronic back pain, neuromuscular disorder, diabetes. EXAM: CT ANGIOGRAPHY CHEST, ABDOMEN AND PELVIS TECHNIQUE: Multidetector CT imaging through the chest, abdomen and pelvis was performed using the standard protocol during bolus administration of intravenous contrast. Multiplanar reconstructed images and MIPs were obtained and reviewed to evaluate the vascular anatomy. CONTRAST:  100 cc Isovue 370 COMPARISON:  Chest radiograph Jul 15, 2015 at 0320 hours FINDINGS: CTA CHEST FINDINGS ARTERIES: Thoracic aorta is normal course and caliber. Mild calcific atherosclerosis. No intrinsic density on noncontrast CT. Homogeneous contrast opacification of thoracic aorta without dissection, aneurysm, luminal irregularity, periaortic fluid collections, or contrast extravasation. MEDIASTINUM: The heart is mildly enlarged. Moderate coronary artery calcifications. No pericardial fluid collection. LUNGS: Tracheobronchial tree is patent, no pneumothorax. Mild bronchial wall thickening. Mild apical bullous changes. Bibasilar dependent atelectasis. 2 mm sub solid subpleural RIGHT pulmonary nodule is likely benign. No pleural effusions, focal consolidations, pulmonary nodules or masses. SOFT TISSUES AND OSSEOUS STRUCTURES: Nonsuspicious. Spinal stimulator leads via T12-L1 interlaminar approach with lead tips at T6. Review of the MIP images confirms the above  findings. Re CTA ABDOMEN AND PELVIS FINDINGS ARTERIES: Abdominal aorta is normal course and caliber. Homogeneous contrast opacification of aortoiliac vessels without dissection, aneurysm, luminal irregularity, periaortic fluid collections, or contrast extravasation. Moderate calcific atherosclerosis in intimal thickening. Celiac axis, superior and inferior mesenteric arteries are normal. Ectatic LEFT Common iliac artery to 16 mm. Accessory bilateral renal arteries. SOLID ORGANS: The liver, spleen, gallbladder, pancreas are unremarkable. Nodular thickening of the bilateral adrenal glands most compatible with hyperplasia. GASTROINTESTINAL TRACT: The stomach, small and large bowel are normal in course and caliber without inflammatory changes. Moderate amount of retained large bowel stool. Normal appendix. KIDNEYS/ URINARY TRACT: Kidneys are orthotopic, demonstrating symmetric enhancement. No nephrolithiasis, hydronephrosis or solid renal masses. The unopacified ureters are normal in course and caliber. Urinary bladder is well distended and unremarkable. PERITONEUM/RETROPERITONEUM: No intraperitoneal free fluid nor free air. Aortoiliac vessels are normal in course and caliber. No lymphadenopathy by CT size criteria. Prostate is mildly enlarged. SOFT TISSUE/OSSEOUS STRUCTURES: Nonsuspicious. Moderate RIGHT and small LEFT fat containing inguinal  hernias. Battery pack RIGHT gluteus subcutaneous fat. Status post L4 and L5 laminectomies. L3 through S1 pedicle screws and bridging rods with incorporated posterior bone graft material. Mild lucency of the RIGHT S1 screw. Severe LEFT greater than RIGHT L5-S1 neural foraminal narrowing. Non incorporated L5-S1 interbody fusion material. Moderate degenerative change of the RIGHT hip. Bridging LEFT sacroiliac osteophyte. Review of the MIP images confirms the above findings. IMPRESSION: CTA CHEST: No acute vascular process. Mild bronchial wall thickening can be seen with reactive  airway disease or bronchitis. Mild cardiomegaly. CTA ABDOMEN AND PELVIS: No acute vascular process or acute intra-abdominal/pelvic process. Status post L3 through S1 PLIF, with findings of L5-S1 hardware failure. Electronically Signed   By: Elon Alas M.D.   On: 07/15/2015 04:58    ASSESSMENT AND PLAN   1. Unstable angina pectoris (HCC) - Troponin flat trend. Cath showed patent RCA stent. Irregularities noted in the proximal and mid LAD as well as circumflex. There is up to 50% stenosis in the proximal segment of the large first obtuse marginal. No obvious explanation of chest pain. EF of 35-40%. LVEDP 23 mm Hg. Currently chest pain free.   2. Recent STEMI - Occluded RCA was treated with thrombectomy and DES. Continue ASA, brillinta, statin, ACE and BB. Consider adding Imdur.   3. HTN - Stable and controlled   4. HL - Continue statin.   5. DM - resume metformin 48 hours post cath.   6. Decreased heart function  - Cath 6/13 showed EF of 35-40%. LVEDP 23 mm Hg. He is evolemic on exam today. Recent echo during admission 07/15/15 showed LV function showed LV EF of 50-55%, mild LVH. Difficult study. Plan to get limited echo however patient want to go home now as he has pain clinic appointment today at 1pm. Will get as outpatient. MD to see.   Jarrett Soho PA-C Pager [phone]   Attending Note:   The patient was seen and examined.  Agree with assessment and plan as noted above.  Changes made to the above note as needed.  Patient seen and independently examined with Robbie Lis, PA .   We discussed all aspects of the encounter. I agree with the assessment and plan as stated above.  Cath showed no significant worsening of his CAD LV EDP was 23.    It did show decreased LV function.   He is on low dose Lisinopril and metoprolol  Will add lasix and kdur   Follow up with [name] / PA in several weeks for office visit and BMP  Ok for DC today     I have spent a  total of 40 minutes with patient reviewing hospital  notes , telemetry, EKGs, labs and examining patient as well as establishing an assessment and plan that was discussed with the patient. > 50% of time was spent in direct patient care.    Thayer Headings, Brooke Bonito., MD, [facility] 08/08/2015, 11:00 AM [address]  [place] Pager [phone]

## 2015-08-08 NOTE — Discharge Summary (Signed)
Discharge Summary    Patient ID: Thomas Mcclure,  MRN: [id number], DOB/AGE: Jul 01, 1955 60 y.o.  Admit date: 08/05/2015 Discharge date: 08/08/2015  Primary Care Provider: [name] Primary Cardiologist: [name]  Discharge Diagnoses    Principal Problem:   Unstable angina pectoris [facility]) Active Problems:   CAD in native artery, 07/15/15 PCI of RCA with DES   Hypertension   Hyperlipidemia   Diabetes mellitus ([place])   Chronic back pain   Pain in the chest   Allergies Allergies  Allergen Reactions  . Toradol [Ketorolac Tromethamine] Anaphylaxis    bp dropped, diaphoresis  . Other     Allergic reaction to steroid? Unsure of name  . Adhesive [Tape] Rash and Other (See Comments)    "blisters"    Diagnostic Studies/Procedures     Left Heart Cath and Coronary Angiography    Conclusion    1. Prox LAD to Mid LAD lesion, 20% stenosed. 2. Prox Cx to Mid Cx lesion, 20% stenosed. 3. Ost 1st Mrg to 1st Mrg lesion, 50% stenosed.   Widely patent right coronary artery including recently placed proximal stent at the time of acute coronary intervention.  Irregularities noted in the proximal and mid LAD as well as circumflex. There is up to 50% stenosis in the proximal segment of the large first obtuse marginal.  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 Overall LV function is moderately decreased with an EF of 35-40%. LVEDP 23 mmHg.  No obvious explanation for the patient's ongoing chest pain complaints.      History of Present Illness     60 yo M w a h/o DM, HTN, HLD, chronic back pain and recent NSTEMI who came to ER 08/06/15 for chest pain.   He had a late presentation of an inferior infarction on 07/15/15 and had elevation of troponin. At catheterization he had an occluded right coronary artery treated with thrombectomy and placement of a 3.5 x 24 mm drug-eluting stent. He was discharged home and was feeling reasonably well. On Friday morning he had  diaphoresis paleness and some substernal pressure while at a graduation took one nitroglycerin with relief. He felt somewhat poorly over the weekend and awoke this morning feeling report feeling poorly went to church and then had weakness of his legs and generally did not feel well around 7:00 tonight he had the onset of sharp chest pain that was somewhat positional but similar to his previous pain. He evidently shook all over complained of diaphoresis and sweating and took 2 nitroglycerin without relief after which EMS was called. Ventricular tachycardia was reported although he has not had any since he was here. Initial point-of-care troponin was 0.09. He is currently pain-free EKG did not show any acute changes and appeared improved from the previous one.  Hospital Course     Consultants: None  The patient was admitted with unstable angina an started on IV heparin with plan to repeat cath Monday whoever it was delayed until Tuesday for full schedule in cath lab. IV nitro was added due to intermittent chest pressure but no improvement. Troponin flat trend. Cath showed patent RCA stent. Irregularities noted in the proximal and mid LAD as well as circumflex. There is up to 50% stenosis in the proximal segment of the large first obtuse marginal. No obvious explanation of chest pain. EF of 35-40%. LVEDP 23 mm Hg. No further chest pain. Recent echo during admission 07/15/15 showed LV function showed LV EF of 50-55%,  mild LVH. Difficult study. Plan to get limited echo however patient want to go home as he has pain clinic appointment later in a day. The case discussed with attending and he was started on lasix and kdur for new decreased LV function on cath. He was euvolemic.   The patient has been seen by Dr. Acie Fredrickson today and deemed ready for discharge home. All follow-up appointments have been scheduled. Discharge medications are listed below.   Will need BMET during post hospital appointment. Consider  repeat echo as outpatient after discussion with [name] who is primary cardiologist who did repeat cath during this admission.    Discharge Vitals Blood pressure 129/104, pulse 69, temperature 98.1 F (36.7 C), temperature source Oral, resp. rate 18, height 5' 5.5" (1.664 m), weight 182 lb 9.6 oz (82.827 kg), SpO2 95 %.  Filed Weights   08/07/15 0500 08/07/15 0822 08/08/15 0500  Weight: 220 lb 8 oz (100.018 kg) 185 lb (83.915 kg) 182 lb 9.6 oz (82.827 kg)    Labs & Radiologic Studies     CBC  Recent Labs  08/05/15 2202 08/07/15 0405 08/08/15 0441  WBC 7.6 7.7 6.1  NEUTROABS 3.8  --   --   HGB 12.1* 11.3* 11.9*  HCT 37.4* 36.0* 37.7*  MCV 81.8 82.9 82.3  PLT 337 313 [id number]   Basic Metabolic Panel  Recent Labs  08/07/15 0405 08/08/15 0441  NA 140 140  K 3.8 3.6  CL 108 106  CO2 25 29  GLUCOSE 100* 102*  BUN 9 7  CREATININE 1.02 1.10  CALCIUM 9.1 8.9   Liver Function Tests No results for input(s): AST, ALT, ALKPHOS, BILITOT, PROT, ALBUMIN in the last 72 hours. No results for input(s): LIPASE, AMYLASE in the last 72 hours. Cardiac Enzymes  Recent Labs  08/06/15 0323 08/06/15 0757 08/06/15 1419  TROPONINI 0.07* 0.07* 0.06*    Dg Chest 2 View  07/15/2015  CLINICAL DATA:  Chest pain, back pain, and right shoulder pain. Injury while mowing the lawn. EXAM: CHEST  2 VIEW COMPARISON:  04/16/2012 FINDINGS: Linear atelectasis in the lung bases. No focal airspace disease or consolidation. No blunting of costophrenic angles. No pneumothorax. Normal heart size and pulmonary vascularity. Tortuous aorta. Spinal stimulator leads in the mid thoracic region. IMPRESSION: Shallow inspiration with atelectasis in the lung bases. Electronically Signed   By: Lucienne Capers M.D.   On: 07/15/2015 04:07   Dg Chest Portable 1 View  08/05/2015  CLINICAL DATA:  7 p.m. today, onset right sternal chest pain and diaphoresis. EXAM: PORTABLE CHEST 1 VIEW COMPARISON:  07/15/2015 FINDINGS:  Shallow inspiration. Heart size and pulmonary vascularity are normal. No focal airspace disease or consolidation. No blunting of costophrenic angles. No pneumothorax. Mediastinal contours appear intact. Stimulator leads projected over the mid thoracic spine. IMPRESSION: No active disease. Electronically Signed   By: Lucienne Capers M.D.   On: 08/05/2015 22:52   Ct Angio Chest/abd/pel For Dissection W And/or W/wo  07/15/2015  CLINICAL DATA:  Chest pain and shortness of breath. Assess for dissection. History of hypertension, hyperlipidemia, chronic back pain, neuromuscular disorder, diabetes. EXAM: CT ANGIOGRAPHY CHEST, ABDOMEN AND PELVIS TECHNIQUE: Multidetector CT imaging through the chest, abdomen and pelvis was performed using the standard protocol during bolus administration of intravenous contrast. Multiplanar reconstructed images and MIPs were obtained and reviewed to evaluate the vascular anatomy. CONTRAST:  100 cc Isovue 370 COMPARISON:  Chest radiograph Jul 15, 2015 at 0320 hours FINDINGS: CTA CHEST FINDINGS ARTERIES: Thoracic aorta  is normal course and caliber. Mild calcific atherosclerosis. No intrinsic density on noncontrast CT. Homogeneous contrast opacification of thoracic aorta without dissection, aneurysm, luminal irregularity, periaortic fluid collections, or contrast extravasation. MEDIASTINUM: The heart is mildly enlarged. Moderate coronary artery calcifications. No pericardial fluid collection. LUNGS: Tracheobronchial tree is patent, no pneumothorax. Mild bronchial wall thickening. Mild apical bullous changes. Bibasilar dependent atelectasis. 2 mm sub solid subpleural RIGHT pulmonary nodule is likely benign. No pleural effusions, focal consolidations, pulmonary nodules or masses. SOFT TISSUES AND OSSEOUS STRUCTURES: Nonsuspicious. Spinal stimulator leads via T12-L1 interlaminar approach with lead tips at T6. Review of the MIP images confirms the above findings. Re CTA ABDOMEN AND PELVIS  FINDINGS ARTERIES: Abdominal aorta is normal course and caliber. Homogeneous contrast opacification of aortoiliac vessels without dissection, aneurysm, luminal irregularity, periaortic fluid collections, or contrast extravasation. Moderate calcific atherosclerosis in intimal thickening. Celiac axis, superior and inferior mesenteric arteries are normal. Ectatic LEFT Common iliac artery to 16 mm. Accessory bilateral renal arteries. SOLID ORGANS: The liver, spleen, gallbladder, pancreas are unremarkable. Nodular thickening of the bilateral adrenal glands most compatible with hyperplasia. GASTROINTESTINAL TRACT: The stomach, small and large bowel are normal in course and caliber without inflammatory changes. Moderate amount of retained large bowel stool. Normal appendix. KIDNEYS/ URINARY TRACT: Kidneys are orthotopic, demonstrating symmetric enhancement. No nephrolithiasis, hydronephrosis or solid renal masses. The unopacified ureters are normal in course and caliber. Urinary bladder is well distended and unremarkable. PERITONEUM/RETROPERITONEUM: No intraperitoneal free fluid nor free air. Aortoiliac vessels are normal in course and caliber. No lymphadenopathy by CT size criteria. Prostate is mildly enlarged. SOFT TISSUE/OSSEOUS STRUCTURES: Nonsuspicious. Moderate RIGHT and small LEFT fat containing inguinal hernias. Battery pack RIGHT gluteus subcutaneous fat. Status post L4 and L5 laminectomies. L3 through S1 pedicle screws and bridging rods with incorporated posterior bone graft material. Mild lucency of the RIGHT S1 screw. Severe LEFT greater than RIGHT L5-S1 neural foraminal narrowing. Non incorporated L5-S1 interbody fusion material. Moderate degenerative change of the RIGHT hip. Bridging LEFT sacroiliac osteophyte. Review of the MIP images confirms the above findings. IMPRESSION: CTA CHEST: No acute vascular process. Mild bronchial wall thickening can be seen with reactive airway disease or bronchitis. Mild  cardiomegaly. CTA ABDOMEN AND PELVIS: No acute vascular process or acute intra-abdominal/pelvic process. Status post L3 through S1 PLIF, with findings of L5-S1 hardware failure. Electronically Signed   By: Elon Alas M.D.   On: 07/15/2015 04:58    Disposition   Pt is being discharged home today in good condition.  Follow-up Plans & Appointments    Follow-up Information    Follow up with Truitt Merle, NP. Go on 08/22/2015.   Specialties:  Nurse Practitioner, Interventional Cardiology, Cardiology, Radiology   Why:  @8 :30 for post hospital    Contact information:   [place] [place] [address] [phone]      Discharge Instructions    Diet - low sodium heart healthy    Complete by:  As directed      Discharge instructions    Complete by:  As directed   No driving for 48 hours. No lifting over 5 lbs for 1 week. No sexual activity for 1 week. You may return to work on Monday 6/19 . Keep procedure site clean & dry. If you notice increased pain, swelling, bleeding or pus, call/return!  You may shower, but no soaking baths/hot tubs/pools for 1 week.   Hold metformin today. Resume tomorrow 6/15 evening.     Increase activity slowly  Complete by:  As directed            Discharge Medications   Current Discharge Medication List    START taking these medications   Details  furosemide (LASIX) 40 MG tablet Take 1 tablet (40 mg total) by mouth daily. Qty: 30 tablet, Refills: 6    potassium chloride (KLOR-CON 10) 10 MEQ tablet Take 1 tablet (10 mEq total) by mouth once. Qty: 30 tablet, Refills: 6      CONTINUE these medications which have NOT CHANGED   Details  aspirin EC 81 MG tablet Take 1 tablet (81 mg total) by mouth daily. Qty: 30 tablet, Refills: 1    atorvastatin (LIPITOR) 80 MG tablet Take 1 tablet (80 mg total) by mouth daily at 6 PM. Qty: 30 tablet, Refills: 1    cyclobenzaprine (FLEXERIL) 10 MG tablet Take 10 mg by mouth 3 (three)  times daily as needed for muscle spasms.     diazepam (VALIUM) 10 MG tablet Take 10 mg by mouth 2 (two) times daily. For spasms    DULoxetine (CYMBALTA) 60 MG capsule Take 60 mg by mouth daily.    lisinopril (PRINIVIL,ZESTRIL) 5 MG tablet Take 1 tablet (5 mg total) by mouth daily. Qty: 90 tablet, Refills: 3   Associated Diagnoses: Essential hypertension    meloxicam (MOBIC) 15 MG tablet Take 15 mg by mouth daily.    metFORMIN (GLUCOPHAGE-XR) 750 MG 24 hr tablet Take 750 mg by mouth daily with supper.    metoprolol tartrate (LOPRESSOR) 25 MG tablet Take 0.5 tablets (12.5 mg total) by mouth 2 (two) times daily. Qty: 60 tablet, Refills: 1    nitroGLYCERIN (NITROSTAT) 0.4 MG SL tablet Place 1 tablet (0.4 mg total) under the tongue every 5 (five) minutes as needed for chest pain. Qty: 25 tablet, Refills: 6    omeprazole (PRILOSEC) 40 MG capsule Take 40 mg by mouth daily.    Oxycodone HCl 10 MG TABS Take 1 tablet (10 mg total) by mouth every 4 (four) hours. Qty: 60 tablet, Refills: 0    polyethylene glycol powder (GLYCOLAX/MIRALAX) powder Take 17 g by mouth daily as needed (constipation). Mix in 8 oz water, juice, soda, coffee or tea and drink    pregabalin (LYRICA) 100 MG capsule Take 100 mg by mouth 3 (three) times daily.     ticagrelor (BRILINTA) 90 MG TABS tablet Take 1 tablet (90 mg total) by mouth 2 (two) times daily. Qty: 60 tablet, Refills: 1    topiramate (TOPAMAX) 50 MG tablet Take 50 mg by mouth 2 (two) times daily as needed (nerve pain). For nerve pain    traMADol (ULTRAM) 50 MG tablet Take 100 mg by mouth 3 (three) times daily as needed (pain).           Outstanding Labs/Studies   BEMT during post hospital   Duration of Discharge Encounter   Greater than 30 minutes including physician time.  Signed, Bhagat,Bhavinkumar PA-C 08/08/2015, 11:29 AM  Attending Note:   The patient was seen and examined.  Agree with assessment and plan as noted above.  Changes made  to the above note as needed.  Patient seen and independently examined with Robbie Lis, PA.   We discussed all aspects of the encounter. I agree with the assessment and plan as stated above.  Pt is stable for DC Continue Rx for systolic CHF    I have spent a total of 40 minutes with patient reviewing hospital  notes , telemetry, EKGs, labs  and examining patient as well as establishing an assessment and plan that was discussed with the patient. > 50% of time was spent in direct patient care.    Thayer Headings, Brooke Bonito., MD, [facility] 08/08/2015, 4:43 PM [address],  [place] Pager [phone]

## 2015-08-08 NOTE — Progress Notes (Signed)
Patient being discharged per MD order. A copy of all discharge paper given to patient.

## 2015-08-09 ENCOUNTER — Other Ambulatory Visit: Payer: Medicare Other

## 2015-08-13 ENCOUNTER — Other Ambulatory Visit: Payer: Self-pay | Admitting: Internal Medicine

## 2015-08-22 ENCOUNTER — Encounter: Payer: Medicare Other | Admitting: Nurse Practitioner

## 2015-08-24 ENCOUNTER — Emergency Department (HOSPITAL_COMMUNITY): Payer: Medicare Other

## 2015-08-24 ENCOUNTER — Observation Stay (HOSPITAL_COMMUNITY)
Admission: EM | Admit: 2015-08-24 | Discharge: 2015-08-26 | Disposition: A | Discharge status: Home Health | Payer: Medicare Other | Attending: Internal Medicine | Admitting: Internal Medicine

## 2015-08-24 ENCOUNTER — Encounter (HOSPITAL_COMMUNITY): Payer: Self-pay | Admitting: Emergency Medicine

## 2015-08-24 DIAGNOSIS — I10 Essential (primary) hypertension: Secondary | ICD-10-CM | POA: Diagnosis not present

## 2015-08-24 DIAGNOSIS — R262 Difficulty in walking, not elsewhere classified: Secondary | ICD-10-CM | POA: Insufficient documentation

## 2015-08-24 DIAGNOSIS — I2510 Atherosclerotic heart disease of native coronary artery without angina pectoris: Secondary | ICD-10-CM | POA: Insufficient documentation

## 2015-08-24 DIAGNOSIS — G8929 Other chronic pain: Secondary | ICD-10-CM | POA: Insufficient documentation

## 2015-08-24 DIAGNOSIS — R5383 Other fatigue: Secondary | ICD-10-CM | POA: Diagnosis not present

## 2015-08-24 DIAGNOSIS — E785 Hyperlipidemia, unspecified: Secondary | ICD-10-CM | POA: Insufficient documentation

## 2015-08-24 DIAGNOSIS — R269 Unspecified abnormalities of gait and mobility: Secondary | ICD-10-CM | POA: Insufficient documentation

## 2015-08-24 DIAGNOSIS — R531 Weakness: Secondary | ICD-10-CM | POA: Diagnosis not present

## 2015-08-24 DIAGNOSIS — K219 Gastro-esophageal reflux disease without esophagitis: Secondary | ICD-10-CM | POA: Insufficient documentation

## 2015-08-24 DIAGNOSIS — E876 Hypokalemia: Secondary | ICD-10-CM | POA: Insufficient documentation

## 2015-08-24 DIAGNOSIS — M069 Rheumatoid arthritis, unspecified: Secondary | ICD-10-CM | POA: Insufficient documentation

## 2015-08-24 DIAGNOSIS — F329 Major depressive disorder, single episode, unspecified: Secondary | ICD-10-CM | POA: Diagnosis not present

## 2015-08-24 DIAGNOSIS — E119 Type 2 diabetes mellitus without complications: Secondary | ICD-10-CM | POA: Diagnosis not present

## 2015-08-24 DIAGNOSIS — M549 Dorsalgia, unspecified: Secondary | ICD-10-CM | POA: Diagnosis present

## 2015-08-24 DIAGNOSIS — I252 Old myocardial infarction: Secondary | ICD-10-CM | POA: Insufficient documentation

## 2015-08-24 LAB — CBC WITH DIFFERENTIAL/PLATELET
Basophils Absolute: 0 10*3/uL (ref 0.0–0.1)
Basophils Relative: 0 %
Eosinophils Absolute: 0.4 10*3/uL (ref 0.0–0.7)
Eosinophils Relative: 5 %
HCT: 37.4 % — ABNORMAL LOW (ref 39.0–52.0)
Hemoglobin: 12.2 g/dL — ABNORMAL LOW (ref 13.0–17.0)
Lymphocytes Relative: 38 %
Lymphs Abs: 2.8 10*3/uL (ref 0.7–4.0)
MCH: 26.7 pg (ref 26.0–34.0)
MCHC: 32.6 g/dL (ref 30.0–36.0)
MCV: 81.8 fL (ref 78.0–100.0)
Monocytes Absolute: 0.3 10*3/uL (ref 0.1–1.0)
Monocytes Relative: 4 %
Neutro Abs: 4 10*3/uL (ref 1.7–7.7)
Neutrophils Relative %: 53 %
Platelets: 259 10*3/uL (ref 150–400)
RBC: 4.57 MIL/uL (ref 4.22–5.81)
RDW: 15.6 % — ABNORMAL HIGH (ref 11.5–15.5)
WBC: 7.5 10*3/uL (ref 4.0–10.5)

## 2015-08-24 LAB — COMPREHENSIVE METABOLIC PANEL
ALT: 24 U/L (ref 17–63)
AST: 20 U/L (ref 15–41)
Albumin: 3.8 g/dL (ref 3.5–5.0)
Alkaline Phosphatase: 68 U/L (ref 38–126)
Anion gap: 7 (ref 5–15)
BUN: 15 mg/dL (ref 6–20)
CO2: 25 mmol/L (ref 22–32)
Calcium: 9.3 mg/dL (ref 8.9–10.3)
Chloride: 109 mmol/L (ref 101–111)
Creatinine, Ser: 1.14 mg/dL (ref 0.61–1.24)
GFR calc Af Amer: >60 mL/min (ref >60)
GFR calc non Af Amer: >60 mL/min (ref >60)
Glucose, Bld: 99 mg/dL (ref 65–99)
Potassium: 3.1 mmol/L — ABNORMAL LOW (ref 3.5–5.1)
Sodium: 141 mmol/L (ref 135–145)
Total Bilirubin: 0.6 mg/dL (ref 0.3–1.2)
Total Protein: 6.6 g/dL (ref 6.5–8.1)

## 2015-08-24 LAB — I-STAT TROPONIN, ED
Troponin i, poc: 0.15 ng/mL (ref 0.00–0.08)
Troponin i, poc: 0.19 ng/mL (ref 0.00–0.08)

## 2015-08-24 LAB — URINALYSIS, ROUTINE W REFLEX MICROSCOPIC
Bilirubin Urine: NEGATIVE
Glucose, UA: NEGATIVE mg/dL
Hgb urine dipstick: NEGATIVE
Ketones, ur: NEGATIVE mg/dL
Leukocytes, UA: NEGATIVE
Nitrite: NEGATIVE
Protein, ur: NEGATIVE mg/dL
Specific Gravity, Urine: 1.01 (ref 1.005–1.030)
pH: 6 (ref 5.0–8.0)

## 2015-08-24 LAB — RAPID URINE DRUG SCREEN, HOSP PERFORMED
Amphetamines: NOT DETECTED
Barbiturates: NOT DETECTED
Benzodiazepines: POSITIVE — AB
Cocaine: NOT DETECTED
Opiates: POSITIVE — AB
Tetrahydrocannabinol: NOT DETECTED

## 2015-08-24 LAB — ETHANOL: Alcohol, Ethyl (B): <5 mg/dL (ref <5)

## 2015-08-24 LAB — CBG MONITORING, ED: Glucose-Capillary: 117 mg/dL — ABNORMAL HIGH (ref 65–99)

## 2015-08-24 MED ORDER — DULOXETINE HCL 60 MG PO CPEP
60.0000 mg | ORAL_CAPSULE | Freq: Every day | ORAL | Status: DC
  Administered 2015-08-25 – 2015-08-26 (×2): 60 mg via ORAL
  Filled 2015-08-24 (×2): qty 1

## 2015-08-24 MED ORDER — PREGABALIN 100 MG PO CAPS
100.0000 mg | ORAL_CAPSULE | Freq: Three times a day (TID) | ORAL | Status: DC
  Administered 2015-08-25 – 2015-08-26 (×6): 100 mg via ORAL
  Filled 2015-08-24: qty 4
  Filled 2015-08-24 (×5): qty 1

## 2015-08-24 MED ORDER — TOPIRAMATE 25 MG PO TABS
50.0000 mg | ORAL_TABLET | Freq: Two times a day (BID) | ORAL | Status: DC | PRN
  Administered 2015-08-25: 50 mg via ORAL
  Filled 2015-08-24 (×2): qty 2

## 2015-08-24 MED ORDER — NALOXONE HCL 0.4 MG/ML IJ SOLN
0.4000 mg | Freq: Once | INTRAMUSCULAR | Status: AC
  Administered 2015-08-24: 0.4 mg via INTRAVENOUS
  Filled 2015-08-24: qty 1

## 2015-08-24 MED ORDER — POLYETHYLENE GLYCOL 3350 17 G PO PACK: 17.0000 g | PACK | Freq: Every day | ORAL | Status: DC | PRN

## 2015-08-24 MED ORDER — ENOXAPARIN SODIUM 40 MG/0.4ML ~~LOC~~ SOLN
40.0000 mg | SUBCUTANEOUS | Status: DC
  Administered 2015-08-25 – 2015-08-26 (×2): 40 mg via SUBCUTANEOUS
  Filled 2015-08-24 (×2): qty 0.4

## 2015-08-24 MED ORDER — ASPIRIN EC 81 MG PO TBEC
81.0000 mg | DELAYED_RELEASE_TABLET | Freq: Every day | ORAL | Status: DC
  Administered 2015-08-25 – 2015-08-26 (×2): 81 mg via ORAL
  Filled 2015-08-24 (×2): qty 1

## 2015-08-24 MED ORDER — LISINOPRIL 10 MG PO TABS
5.0000 mg | ORAL_TABLET | Freq: Every day | ORAL | Status: DC
  Administered 2015-08-25 – 2015-08-26 (×2): 5 mg via ORAL
  Filled 2015-08-24 (×2): qty 1

## 2015-08-24 MED ORDER — POTASSIUM CHLORIDE 10 MEQ/100ML IV SOLN
10.0000 meq | INTRAVENOUS | Status: AC
  Administered 2015-08-24 (×2): 10 meq via INTRAVENOUS
  Filled 2015-08-24 (×2): qty 100

## 2015-08-24 MED ORDER — SODIUM CHLORIDE 0.9 % IV BOLUS (SEPSIS)
1000.0000 mL | Freq: Once | INTRAVENOUS | Status: AC
  Administered 2015-08-24: 1000 mL via INTRAVENOUS

## 2015-08-24 MED ORDER — POTASSIUM CHLORIDE ER 10 MEQ PO TBCR
10.0000 meq | EXTENDED_RELEASE_TABLET | Freq: Once | ORAL | Status: AC
  Administered 2015-08-25: 10 meq via ORAL
  Filled 2015-08-24 (×2): qty 1

## 2015-08-24 MED ORDER — CYCLOBENZAPRINE HCL 10 MG PO TABS: 10.0000 mg | ORAL_TABLET | Freq: Three times a day (TID) | ORAL | Status: DC | PRN

## 2015-08-24 MED ORDER — METOPROLOL TARTRATE 25 MG PO TABS
12.5000 mg | ORAL_TABLET | Freq: Two times a day (BID) | ORAL | Status: DC
  Administered 2015-08-25 – 2015-08-26 (×4): 12.5 mg via ORAL
  Filled 2015-08-24 (×4): qty 1

## 2015-08-24 MED ORDER — TRAMADOL HCL 50 MG PO TABS
100.0000 mg | ORAL_TABLET | Freq: Three times a day (TID) | ORAL | Status: DC | PRN
  Filled 2015-08-24: qty 2

## 2015-08-24 MED ORDER — ATORVASTATIN CALCIUM 80 MG PO TABS
80.0000 mg | ORAL_TABLET | Freq: Every day | ORAL | Status: DC
  Administered 2015-08-25: 80 mg via ORAL
  Filled 2015-08-24: qty 1

## 2015-08-24 MED ORDER — TICAGRELOR 90 MG PO TABS
90.0000 mg | ORAL_TABLET | Freq: Two times a day (BID) | ORAL | Status: DC
  Administered 2015-08-25 – 2015-08-26 (×4): 90 mg via ORAL
  Filled 2015-08-24 (×4): qty 1

## 2015-08-24 MED ORDER — MELOXICAM 7.5 MG PO TABS
15.0000 mg | ORAL_TABLET | Freq: Every day | ORAL | Status: DC
  Administered 2015-08-25 – 2015-08-26 (×2): 15 mg via ORAL
  Filled 2015-08-24 (×2): qty 2

## 2015-08-24 MED ORDER — METFORMIN HCL ER 750 MG PO TB24
750.0000 mg | ORAL_TABLET | Freq: Every day | ORAL | Status: DC
  Administered 2015-08-25: 750 mg via ORAL
  Filled 2015-08-24 (×2): qty 1

## 2015-08-24 MED ORDER — POTASSIUM CHLORIDE CRYS ER 20 MEQ PO TBCR: 40.0000 meq | EXTENDED_RELEASE_TABLET | Freq: Once | ORAL | Status: DC

## 2015-08-24 MED ORDER — PANTOPRAZOLE SODIUM 40 MG PO TBEC
80.0000 mg | DELAYED_RELEASE_TABLET | Freq: Every day | ORAL | Status: DC
  Administered 2015-08-25 – 2015-08-26 (×2): 80 mg via ORAL
  Filled 2015-08-24 (×2): qty 2

## 2015-08-24 MED ORDER — FUROSEMIDE 40 MG PO TABS
40.0000 mg | ORAL_TABLET | Freq: Every day | ORAL | Status: DC
  Administered 2015-08-25 – 2015-08-26 (×2): 40 mg via ORAL
  Filled 2015-08-24 (×2): qty 1

## 2015-08-24 MED ORDER — NITROGLYCERIN 0.4 MG SL SUBL: 0.4000 mg | SUBLINGUAL_TABLET | SUBLINGUAL | Status: DC | PRN

## 2015-08-24 NOTE — H&P (Signed)
History and Physical    SACHA BESERRA [id number] DOB: 19-Nov-1955 DOA: 08/24/2015   PCP: [name], MD Chief Complaint:  Chief Complaint  Patient presents with  . Fatigue    HPI: ZAHN SHAAK is a 60 y.o. 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  Patient presents to the ED with c/o fatigue and sleepiness per wife.  Patient has been falling asleep early since the time of his heart cath, worse today.  ED Course: Took ultram this morning and usually takes valium at night, no fentanyl patch, no oxy recently.  Review of Systems: As per HPI otherwise 10 point review of systems negative.    Past Medical History  Diagnosis Date  . Arthritis     Rheumatoid  . GERD (gastroesophageal reflux disease)   . Hyperlipidemia   . Hypertension     Dr. [name] [phone]  . Chronic back pain   . 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 Diabetes mellitus     diet controlled  . Cyst (solitary) of breast     ? cyst left calf ,knee  . Baker's cyst     Left calf  . CAD in native artery, 07/15/15 PCI of RCA with DES 07/16/2015    A.   NSTEMI 5/17: LHC - pLAD 20, pLCx 20, OM1 30, pRCA 100, EF 45-50%>> PCI: 2.5 x 24 mm Promus DES to RCA  //  b.   Echo 5/17: mild LVH, EF 50-55%, no RWMA, mod RVE   . Myocardial infarction ([place]) 2017  . Hypokalemia 07/2015    Past Surgical History  Procedure Laterality Date  . Lumbar spine surgery  03/2009  & 2012  . Wisdom tooth extraction      hx of  . Mass excision N/A 04/19/2012    Procedure: removal of posterior cervical lipoma;  Surgeon: [name], MD;  Location: [facility];  Service: Neurosurgery;  Laterality: N/A;  Removal of posterior cervical lipoma  . Spinal cord stimulator insertion N/A 01/07/2013    Procedure:  SPINAL CORD STIMULATOR INSERTION;  Surgeon: [name], MD;  Location: [facility];  Service: Neurosurgery;   Laterality: N/A;  . Cardiac catheterization N/A 07/15/2015    Procedure: Left Heart Cath and Coronary Angiography;  Surgeon: [name], MD;  Location: [facility];  Service: Cardiovascular;  Laterality: N/A;  . Cardiac catheterization N/A 07/15/2015    Procedure: Coronary Stent Intervention;  Surgeon: [name], MD;  Location: [facility];  Service: Cardiovascular;  Laterality: N/A;  . Knee surgery    . Coronary stent placement  07/2015  . Cardiac catheterization N/A 08/07/2015    Procedure: Left Heart Cath and Coronary Angiography;  Surgeon: [name], MD;  Location: [facility];  Service: Cardiovascular;  Laterality: N/A;     reports that he has quit smoking. His smoking use included Cigarettes. He quit after 15 years of use. He quit smokeless tobacco use about 4 months ago. He reports that he drinks alcohol. He reports that he does not use illicit drugs.  Allergies  Allergen Reactions  . Toradol [Ketorolac Tromethamine] Anaphylaxis    bp dropped, diaphoresis  . Other     Allergic reaction to steroid? Unsure of name  . Adhesive [Tape] Rash and Other (See Comments)    "blisters"  Family History  Problem Relation Age of Onset  . Heart disease Mother   . Prostate cancer Brother       Prior to Admission medications   Medication Sig Start Date End Date Taking? Authorizing Provider  aspirin EC 81 MG tablet Take 1 tablet (81 mg total) by mouth daily. 07/18/15  Yes Alexa Angela Burke, MD  atorvastatin (LIPITOR) 80 MG tablet Take 1 tablet (80 mg total) by mouth daily at 6 PM. 07/18/15  Yes Alexa Angela Burke, MD  cyclobenzaprine (FLEXERIL) 10 MG tablet Take 10 mg by mouth 3 (three) times daily as needed for muscle spasms.    Yes Historical Provider, MD  diazepam (VALIUM) 10 MG tablet Take 10 mg by mouth 2 (two) times daily. For spasms   Yes Historical Provider, MD  DULoxetine (CYMBALTA) 60 MG capsule Take 60 mg by mouth daily.   Yes Historical Provider, MD    furosemide (LASIX) 40 MG tablet Take 1 tablet (40 mg total) by mouth daily. 08/08/15  Yes Bhavinkumar Bhagat, PA  lisinopril (PRINIVIL,ZESTRIL) 5 MG tablet Take 1 tablet (5 mg total) by mouth daily. 08/02/15  Yes Scott Joylene Draft, PA-C  meloxicam (MOBIC) 15 MG tablet Take 15 mg by mouth daily.   Yes Historical Provider, MD  metFORMIN (GLUCOPHAGE-XR) 750 MG 24 hr tablet Take 750 mg by mouth daily with supper. 06/07/15  Yes Historical Provider, MD  metoprolol tartrate (LOPRESSOR) 25 MG tablet Take 0.5 tablets (12.5 mg total) by mouth 2 (two) times daily. 07/18/15  Yes Alexa Angela Burke, MD  nitroGLYCERIN (NITROSTAT) 0.4 MG SL tablet Place 1 tablet (0.4 mg total) under the tongue every 5 (five) minutes as needed for chest pain. 07/18/15  Yes Scott Joylene Draft, PA-C  omeprazole (PRILOSEC) 40 MG capsule Take 40 mg by mouth daily.   Yes Historical Provider, MD  Oxycodone HCl 10 MG TABS Take 1 tablet (10 mg total) by mouth every 4 (four) hours. Patient taking differently: Take 10 mg by mouth every 6 (six) hours.  01/07/13  Yes Clydell Hakim, MD  polyethylene glycol powder (GLYCOLAX/MIRALAX) powder Take 17 g by mouth daily as needed (constipation). Mix in 8 oz water, juice, soda, coffee or tea and drink 06/07/15  Yes Historical Provider, MD  potassium chloride (KLOR-CON 10) 10 MEQ tablet Take 1 tablet (10 mEq total) by mouth once. 08/08/15  Yes Bhavinkumar Bhagat, PA  pregabalin (LYRICA) 100 MG capsule Take 100 mg by mouth 3 (three) times daily.    Yes Historical Provider, MD  ticagrelor (BRILINTA) 90 MG TABS tablet Take 1 tablet (90 mg total) by mouth 2 (two) times daily. 07/18/15  Yes Alexa Angela Burke, MD  topiramate (TOPAMAX) 50 MG tablet Take 50 mg by mouth 2 (two) times daily as needed (nerve pain). For nerve pain   Yes Historical Provider, MD  traMADol (ULTRAM) 50 MG tablet Take 100 mg by mouth 3 (three) times daily as needed (pain).    Yes Historical Provider, MD    Physical Exam: Filed Vitals:   08/24/15 2045  08/24/15 2100 08/24/15 2115 08/24/15 2130  BP: 131/93 121/86 127/89 120/100  Pulse: 65 66 60 65  Temp:      TempSrc:      Resp: 19 17 16 21   Height:      Weight:      SpO2: 98% 98% 98% 98%      Constitutional: NAD, calm, comfortable Eyes: PERRL, lids and conjunctivae normal ENMT: Mucous membranes are moist. Posterior pharynx clear of  any exudate or lesions.Normal dentition.  Neck: normal, supple, no masses, no thyromegaly Respiratory: clear to auscultation bilaterally, no wheezing, no crackles. Normal respiratory effort. No accessory muscle use.  Cardiovascular: Regular rate and rhythm, no murmurs / rubs / gallops. No extremity edema. 2+ pedal pulses. No carotid bruits.  Abdomen: no tenderness, no masses palpated. No hepatosplenomegaly. Bowel sounds positive.  Musculoskeletal: no clubbing / cyanosis. No joint deformity upper and lower extremities. Good ROM, no contractures. Normal muscle tone.  Skin: no rashes, lesions, ulcers. No induration Neurologic: CN 2-12 grossly intact. Sensation intact, DTR normal. Strength 5/5 in all 4.  Psychiatric: Normal judgment and insight. Alert and oriented x 3. Normal mood.    Labs on Admission: I have personally reviewed following labs and imaging studies  CBC:  Recent Labs Lab 08/24/15 1800  WBC 7.5  NEUTROABS 4.0  HGB 12.2*  HCT 37.4*  MCV 81.8  PLT [id number]   Basic Metabolic Panel:  Recent Labs Lab 08/24/15 1800  NA 141  K 3.1*  CL 109  CO2 25  GLUCOSE 99  BUN 15  CREATININE 1.14  CALCIUM 9.3   GFR: Estimated Creatinine Clearance: 71.2 mL/min (by C-G formula based on Cr of 1.14). Liver Function Tests:  Recent Labs Lab 08/24/15 1800  AST 20  ALT 24  ALKPHOS 68  BILITOT 0.6  PROT 6.6  ALBUMIN 3.8   No results for input(s): LIPASE, AMYLASE in the last 168 hours. No results for input(s): AMMONIA in the last 168 hours. Coagulation Profile: No results for input(s): INR, PROTIME in the last 168 hours. Cardiac  Enzymes: No results for input(s): CKTOTAL, CKMB, CKMBINDEX, TROPONINI in the last 168 hours. BNP (last 3 results) No results for input(s): PROBNP in the last 8760 hours. HbA1C: No results for input(s): HGBA1C in the last 72 hours. CBG:  Recent Labs Lab 08/24/15 1730  GLUCAP 117*   Lipid Profile: No results for input(s): CHOL, HDL, LDLCALC, TRIG, CHOLHDL, LDLDIRECT in the last 72 hours. Thyroid Function Tests: No results for input(s): TSH, T4TOTAL, FREET4, T3FREE, THYROIDAB in the last 72 hours. Anemia Panel: No results for input(s): VITAMINB12, FOLATE, FERRITIN, TIBC, IRON, RETICCTPCT in the last 72 hours. Urine analysis:    Component Value Date/Time   COLORURINE YELLOW 08/24/2015 1905   APPEARANCEUR CLEAR 08/24/2015 1905   LABSPEC 1.010 08/24/2015 1905   PHURINE 6.0 08/24/2015 1905   GLUCOSEU NEGATIVE 08/24/2015 1905   HGBUR NEGATIVE 08/24/2015 1905   BILIRUBINUR NEGATIVE 08/24/2015 1905   KETONESUR NEGATIVE 08/24/2015 1905   PROTEINUR NEGATIVE 08/24/2015 1905   UROBILINOGEN 1.0 07/26/2012 1552   NITRITE NEGATIVE 08/24/2015 1905   LEUKOCYTESUR NEGATIVE 08/24/2015 1905   Sepsis Labs: @LABRCNTIP (procalcitonin:4,lacticidven:4) )No results found for this or any previous visit (from the past 240 hour(s)).   Radiological Exams on Admission: Dg Chest 2 View  08/24/2015  CLINICAL DATA:  60 year old male with central chest pain. EXAM: CHEST  2 VIEW COMPARISON:  Chest radiograph dated 08/05/2015 FINDINGS: Two views of the chest demonstrate bibasilar atelectatic changes. There is no focal consolidation, pleural effusion, or pneumothorax. The cardiac silhouette is within normal limits. No acute osseous pathology identified. A spinal stimulator is noted over the mid thoracic spine. IMPRESSION: No active cardiopulmonary disease. Electronically Signed   By: Anner Crete M.D.   On: 08/24/2015 18:21   Ct Head Wo Contrast  08/24/2015  CLINICAL DATA:  60 year old male with altered  mental status. EXAM: CT HEAD WITHOUT CONTRAST CT CERVICAL SPINE WITHOUT CONTRAST TECHNIQUE: Multidetector CT  imaging of the head and cervical spine was performed following the standard protocol without intravenous contrast. Multiplanar CT image reconstructions of the cervical spine were also generated. COMPARISON:  Head CT dated 12/01/2011 FINDINGS: CT HEAD FINDINGS The ventricles and sulci are appropriate in size for the patient's age. There is no intracranial hemorrhage. No mass effect or midline shift identified. The gray-white matter differentiation is preserved. There is no extra-axial fluid collection. Mild mucoperiosteal thickening of paranasal sinuses. No air-fluid levels. The mastoid air cells are clear. The calvarium is intact. CT CERVICAL SPINE FINDINGS There is no acute fracture or subluxation of the cervical spine.There is multilevel degenerative changes most prominent at C5-C6.The odontoid and spinous processes are intact.There is normal anatomic alignment of the C1-C2 lateral masses. The visualized soft tissues appear unremarkable. IMPRESSION: No acute intracranial pathology. No acute/traumatic cervical spine pathology. Electronically Signed   By: Anner Crete M.D.   On: 08/24/2015 22:27   Ct Cervical Spine Wo Contrast  08/24/2015  CLINICAL DATA:  60 year old male with altered mental status. EXAM: CT HEAD WITHOUT CONTRAST CT CERVICAL SPINE WITHOUT CONTRAST TECHNIQUE: Multidetector CT imaging of the head and cervical spine was performed following the standard protocol without intravenous contrast. Multiplanar CT image reconstructions of the cervical spine were also generated. COMPARISON:  Head CT dated 12/01/2011 FINDINGS: CT HEAD FINDINGS The ventricles and sulci are appropriate in size for the patient's age. There is no intracranial hemorrhage. No mass effect or midline shift identified. The gray-white matter differentiation is preserved. There is no extra-axial fluid collection. Mild  mucoperiosteal thickening of paranasal sinuses. No air-fluid levels. The mastoid air cells are clear. The calvarium is intact. CT CERVICAL SPINE FINDINGS There is no acute fracture or subluxation of the cervical spine.There is multilevel degenerative changes most prominent at C5-C6.The odontoid and spinous processes are intact.There is normal anatomic alignment of the C1-C2 lateral masses. The visualized soft tissues appear unremarkable. IMPRESSION: No acute intracranial pathology. No acute/traumatic cervical spine pathology. Electronically Signed   By: Anner Crete M.D.   On: 08/24/2015 22:27    EKG: Independently reviewed.  Assessment/Plan Active Problems:   Fatigue    Fatigue -  Patient likely needs cardiac rehab  Will get 2d echo to see how his infarct has evolved further  Will get serial trops, but given there was no change on repeat cath just 15 day ago and he is asymptomatic other than "twinges" doubt that he has recurrent MI.   DVT prophylaxis: Lovenox Code Status: Full Family Communication: Family at bedside Consults called: None Admission status: Obs   Etta Quill DO [facility] Pager [phone] from 7PM-7AM  If 7AM-7PM, please contact the day physician for the patient [email or web address] Password [email or web address]  08/24/2015, 11:51 PM

## 2015-08-24 NOTE — ED Notes (Signed)
Patient transported to X-ray 

## 2015-08-24 NOTE — ED Notes (Signed)
CRITICAL VALUE ALERT  Critical value received: TROPONIN 0.15

## 2015-08-24 NOTE — ED Notes (Signed)
Patient transported to CT 

## 2015-08-24 NOTE — ED Notes (Signed)
Pt c/o generalized weakness, last seen well at 430 pm today, pt denies any pain at this time, BP on EMS arrival 90/50, 500 cc fluids given  BP 108/63, HR 90, SPO2 94% on RA, pt is very drowsy on ER arrival responding to voice.

## 2015-08-24 NOTE — ED Provider Notes (Signed)
CSN: [id number]     Arrival date & time 08/24/15  1710 History   First MD Initiated Contact with Patient 08/24/15 1719     Chief Complaint  Patient presents with  . Fatigue     (Consider location/radiation/quality/duration/timing/severity/associated sxs/prior Treatment) HPI Patient presents with generalized weakness and fatigue throughout the day. He has been helping someone in his church move. Denies any focal weakness. States he drove home at around 4:00. He denies any chest pain. Wife states he has had a mild cough this morning. Patient was recently admitted for coronary stent placement and had repeat coronary cath earlier this month. Past Medical History  Diagnosis Date  . Arthritis     Rheumatoid  . GERD (gastroesophageal reflux disease)   . Hyperlipidemia   . Hypertension     Dr. [name] [phone]  . Chronic back pain   . 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 Diabetes mellitus     diet controlled  . Cyst (solitary) of breast     ? cyst left calf ,knee  . Baker's cyst     Left calf  . CAD in native artery, 07/15/15 PCI of RCA with DES 07/16/2015    A.   NSTEMI 5/17: LHC - pLAD 20, pLCx 20, OM1 30, pRCA 100, EF 45-50%>> PCI: 2.5 x 24 mm Promus DES to RCA  //  b.   Echo 5/17: mild LVH, EF 50-55%, no RWMA, mod RVE   . Myocardial infarction ([place]) 2017  . Hypokalemia 07/2015   Past Surgical History  Procedure Laterality Date  . Lumbar spine surgery  03/2009  & 2012  . Wisdom tooth extraction      hx of  . Mass excision N/A 04/19/2012    Procedure: removal of posterior cervical lipoma;  Surgeon: [name], MD;  Location: [facility];  Service: Neurosurgery;  Laterality: N/A;  Removal of posterior cervical lipoma  . Spinal cord stimulator insertion N/A 01/07/2013    Procedure:  SPINAL CORD STIMULATOR INSERTION;  Surgeon: [name], MD;  Location: [facility];  Service: Neurosurgery;  Laterality: N/A;  . Cardiac catheterization N/A 07/15/2015     Procedure: Left Heart Cath and Coronary Angiography;  Surgeon: [name], MD;  Location: [facility];  Service: Cardiovascular;  Laterality: N/A;  . Cardiac catheterization N/A 07/15/2015    Procedure: Coronary Stent Intervention;  Surgeon: [name], MD;  Location: [facility];  Service: Cardiovascular;  Laterality: N/A;  . Knee surgery    . Coronary stent placement  07/2015  . Cardiac catheterization N/A 08/07/2015    Procedure: Left Heart Cath and Coronary Angiography;  Surgeon: [name], MD;  Location: [facility];  Service: Cardiovascular;  Laterality: N/A;   Family History  Problem Relation Age of Onset  . Heart disease Mother   . Prostate cancer Brother    Social History  Substance Use Topics  . Smoking status: Former Smoker -- 15 years    Types: Cigarettes  . Smokeless tobacco: Former Systems developer    Quit date: 04/08/2015     Comment: 5 cig a week  . Alcohol Use: 0.0 oz/week    0 Standard drinks or equivalent per week     Comment: Couple 40 ounces daily until 2012    Review of Systems  Constitutional: Positive for fatigue. Negative for fever and chills.  Respiratory: Negative for shortness of breath.   Cardiovascular: Negative  for chest pain and leg swelling.  Gastrointestinal: Negative for nausea, vomiting, abdominal pain and diarrhea.  Genitourinary: Negative for dysuria, hematuria and flank pain.  Musculoskeletal: Positive for back pain. Negative for neck pain.  Skin: Negative for rash and wound.  Neurological: Positive for weakness (generalized). Negative for dizziness, speech difficulty, light-headedness, numbness and headaches.  All other systems reviewed and are negative.     Allergies  Toradol; Other; and Adhesive  Home Medications   Prior to Admission medications   Medication Sig Start Date End Date Taking? Authorizing Provider  aspirin EC 81 MG tablet Take 1 tablet (81 mg total) by mouth daily. 07/18/15  Yes Alexa Angela Burke, MD   atorvastatin (LIPITOR) 80 MG tablet Take 1 tablet (80 mg total) by mouth daily at 6 PM. 07/18/15  Yes Alexa Angela Burke, MD  cyclobenzaprine (FLEXERIL) 10 MG tablet Take 10 mg by mouth 3 (three) times daily as needed for muscle spasms.    Yes Historical Provider, MD  diazepam (VALIUM) 10 MG tablet Take 10 mg by mouth 2 (two) times daily. For spasms   Yes Historical Provider, MD  DULoxetine (CYMBALTA) 60 MG capsule Take 60 mg by mouth daily.   Yes Historical Provider, MD  furosemide (LASIX) 40 MG tablet Take 1 tablet (40 mg total) by mouth daily. 08/08/15  Yes Bhavinkumar Bhagat, PA  lisinopril (PRINIVIL,ZESTRIL) 5 MG tablet Take 1 tablet (5 mg total) by mouth daily. 08/02/15  Yes Scott Joylene Draft, PA-C  meloxicam (MOBIC) 15 MG tablet Take 15 mg by mouth daily.   Yes Historical Provider, MD  metFORMIN (GLUCOPHAGE-XR) 750 MG 24 hr tablet Take 750 mg by mouth daily with supper. 06/07/15  Yes Historical Provider, MD  metoprolol tartrate (LOPRESSOR) 25 MG tablet Take 0.5 tablets (12.5 mg total) by mouth 2 (two) times daily. 07/18/15  Yes Alexa Angela Burke, MD  nitroGLYCERIN (NITROSTAT) 0.4 MG SL tablet Place 1 tablet (0.4 mg total) under the tongue every 5 (five) minutes as needed for chest pain. 07/18/15  Yes Scott Joylene Draft, PA-C  omeprazole (PRILOSEC) 40 MG capsule Take 40 mg by mouth daily.   Yes Historical Provider, MD  Oxycodone HCl 10 MG TABS Take 1 tablet (10 mg total) by mouth every 4 (four) hours. Patient taking differently: Take 10 mg by mouth every 6 (six) hours.  01/07/13  Yes Clydell Hakim, MD  polyethylene glycol powder (GLYCOLAX/MIRALAX) powder Take 17 g by mouth daily as needed (constipation). Mix in 8 oz water, juice, soda, coffee or tea and drink 06/07/15  Yes Historical Provider, MD  potassium chloride (KLOR-CON 10) 10 MEQ tablet Take 1 tablet (10 mEq total) by mouth once. 08/08/15  Yes Bhavinkumar Bhagat, PA  pregabalin (LYRICA) 100 MG capsule Take 100 mg by mouth 3 (three) times daily.    Yes Historical  Provider, MD  ticagrelor (BRILINTA) 90 MG TABS tablet Take 1 tablet (90 mg total) by mouth 2 (two) times daily. 07/18/15  Yes Alexa Angela Burke, MD  topiramate (TOPAMAX) 50 MG tablet Take 50 mg by mouth 2 (two) times daily as needed (nerve pain). For nerve pain   Yes Historical Provider, MD  traMADol (ULTRAM) 50 MG tablet Take 100 mg by mouth 3 (three) times daily as needed (pain).    Yes Historical Provider, MD   BP 120/100 mmHg  Pulse 65  Temp(Src) 97.8 F (36.6 C) (Oral)  Resp 21  Ht 5\' 10"  (1.778 m)  Wt 182 lb (82.555 kg)  BMI 26.11 kg/m2  SpO2 98% Physical Exam  Constitutional: He is oriented to person, place, and time. He appears well-developed and well-nourished. No distress.  HENT:  Head: Normocephalic and atraumatic.  Dry mucous membranes  Eyes: EOM are normal. Pupils are equal, round, and reactive to light.  Neck: Normal range of motion. Neck supple.  No meningismus  Cardiovascular: Normal rate and regular rhythm.  Exam reveals no gallop and no friction rub.   No murmur heard. Pulmonary/Chest: Effort normal and breath sounds normal. No respiratory distress. He has no wheezes. He has no rales. He exhibits no tenderness.  Abdominal: Soft. Bowel sounds are normal. He exhibits no distension and no mass. There is no tenderness. There is no rebound and no guarding.  Musculoskeletal: Normal range of motion. He exhibits no edema or tenderness.  No lower extremity swelling or asymmetry. Distal pulses are equal  Neurological: He is alert and oriented to person, place, and time.  Patient with poor effort. Appears to be moving all extremities without unilateral focal deficit. The lower extremities appear weaker than upper. No slurring of speech. Sensation is grossly intact.  Skin: Skin is warm and dry. No rash noted. No erythema.  Psychiatric: He has a normal mood and affect. His behavior is normal.  Nursing note and vitals reviewed.   ED Course  Procedures (including critical care  time) Labs Review Labs Reviewed  CBC WITH DIFFERENTIAL/PLATELET - Abnormal; Notable for the following:    Hemoglobin 12.2 (*)    HCT 37.4 (*)    RDW 15.6 (*)    All other components within normal limits  COMPREHENSIVE METABOLIC PANEL - Abnormal; Notable for the following:    Potassium 3.1 (*)    All other components within normal limits  URINE RAPID DRUG SCREEN, HOSP PERFORMED - Abnormal; Notable for the following:    Opiates POSITIVE (*)    Benzodiazepines POSITIVE (*)    All other components within normal limits  I-STAT TROPOININ, ED - Abnormal; Notable for the following:    Troponin i, poc 0.15 (*)    All other components within normal limits  CBG MONITORING, ED - Abnormal; Notable for the following:    Glucose-Capillary 117 (*)    All other components within normal limits  I-STAT TROPOININ, ED - Abnormal; Notable for the following:    Troponin i, poc 0.19 (*)    All other components within normal limits  URINALYSIS, ROUTINE W REFLEX MICROSCOPIC (NOT AT [facility])  ETHANOL  TROPONIN I  TROPONIN I  TROPONIN I    Imaging Review Dg Chest 2 View  08/24/2015  CLINICAL DATA:  60 year old male with central chest pain. EXAM: CHEST  2 VIEW COMPARISON:  Chest radiograph dated 08/05/2015 FINDINGS: Two views of the chest demonstrate bibasilar atelectatic changes. There is no focal consolidation, pleural effusion, or pneumothorax. The cardiac silhouette is within normal limits. No acute osseous pathology identified. A spinal stimulator is noted over the mid thoracic spine. IMPRESSION: No active cardiopulmonary disease. Electronically Signed   By: Anner Crete M.D.   On: 08/24/2015 18:21   Ct Head Wo Contrast  08/24/2015  CLINICAL DATA:  60 year old male with altered mental status. EXAM: CT HEAD WITHOUT CONTRAST CT CERVICAL SPINE WITHOUT CONTRAST TECHNIQUE: Multidetector CT imaging of the head and cervical spine was performed following the standard protocol without intravenous contrast.  Multiplanar CT image reconstructions of the cervical spine were also generated. COMPARISON:  Head CT dated 12/01/2011 FINDINGS: CT HEAD FINDINGS The ventricles and sulci are appropriate in size for  the patient's age. There is no intracranial hemorrhage. No mass effect or midline shift identified. The gray-white matter differentiation is preserved. There is no extra-axial fluid collection. Mild mucoperiosteal thickening of paranasal sinuses. No air-fluid levels. The mastoid air cells are clear. The calvarium is intact. CT CERVICAL SPINE FINDINGS There is no acute fracture or subluxation of the cervical spine.There is multilevel degenerative changes most prominent at C5-C6.The odontoid and spinous processes are intact.There is normal anatomic alignment of the C1-C2 lateral masses. The visualized soft tissues appear unremarkable. IMPRESSION: No acute intracranial pathology. No acute/traumatic cervical spine pathology. Electronically Signed   By: Anner Crete M.D.   On: 08/24/2015 22:27   Ct Cervical Spine Wo Contrast  08/24/2015  CLINICAL DATA:  60 year old male with altered mental status. EXAM: CT HEAD WITHOUT CONTRAST CT CERVICAL SPINE WITHOUT CONTRAST TECHNIQUE: Multidetector CT imaging of the head and cervical spine was performed following the standard protocol without intravenous contrast. Multiplanar CT image reconstructions of the cervical spine were also generated. COMPARISON:  Head CT dated 12/01/2011 FINDINGS: CT HEAD FINDINGS The ventricles and sulci are appropriate in size for the patient's age. There is no intracranial hemorrhage. No mass effect or midline shift identified. The gray-white matter differentiation is preserved. There is no extra-axial fluid collection. Mild mucoperiosteal thickening of paranasal sinuses. No air-fluid levels. The mastoid air cells are clear. The calvarium is intact. CT CERVICAL SPINE FINDINGS There is no acute fracture or subluxation of the cervical spine.There is  multilevel degenerative changes most prominent at C5-C6.The odontoid and spinous processes are intact.There is normal anatomic alignment of the C1-C2 lateral masses. The visualized soft tissues appear unremarkable. IMPRESSION: No acute intracranial pathology. No acute/traumatic cervical spine pathology. Electronically Signed   By: Anner Crete M.D.   On: 08/24/2015 22:27   I have personally reviewed and evaluated these images and lab results as part of my medical decision-making.   EKG Interpretation   Date/Time:  Friday August 24 2015 17:23:13 EDT Ventricular Rate:  78 PR Interval:    QRS Duration: 111 QT Interval:  413 QTC Calculation: 471 R Axis:   -14 Text Interpretation:  Sinus rhythm Probable left ventricular hypertrophy  Inferior infarct, age indeterminate Abnormal T, consider ischemia,  anterior leads Lateral leads are also involved Confirmed by Lita Mains  MD,  Yetunde Leis ([id number]) on 08/24/2015 5:32:47 PM      MDM   Final diagnoses:  Other fatigue  Hypokalemia   Patient is still fatigued without focal neurologic deficit. Urine drug screen positive for opioids and benzodiazepines. Patient does have a elevated troponin without definite EKG changes. Discussed with cardiology and recommended no acute interventions at this point. We'll speak with medicine about admission for observation.     Julianne Rice, MD 08/24/15 2350

## 2015-08-25 ENCOUNTER — Observation Stay (HOSPITAL_BASED_OUTPATIENT_CLINIC_OR_DEPARTMENT_OTHER): Payer: Medicare Other

## 2015-08-25 DIAGNOSIS — G8929 Other chronic pain: Secondary | ICD-10-CM

## 2015-08-25 DIAGNOSIS — E785 Hyperlipidemia, unspecified: Secondary | ICD-10-CM

## 2015-08-25 DIAGNOSIS — I255 Ischemic cardiomyopathy: Secondary | ICD-10-CM | POA: Diagnosis not present

## 2015-08-25 DIAGNOSIS — M549 Dorsalgia, unspecified: Secondary | ICD-10-CM

## 2015-08-25 DIAGNOSIS — R531 Weakness: Secondary | ICD-10-CM | POA: Diagnosis not present

## 2015-08-25 DIAGNOSIS — E1159 Type 2 diabetes mellitus with other circulatory complications: Secondary | ICD-10-CM | POA: Diagnosis not present

## 2015-08-25 DIAGNOSIS — I10 Essential (primary) hypertension: Secondary | ICD-10-CM

## 2015-08-25 DIAGNOSIS — R5383 Other fatigue: Secondary | ICD-10-CM

## 2015-08-25 DIAGNOSIS — E876 Hypokalemia: Secondary | ICD-10-CM

## 2015-08-25 LAB — TROPONIN I
Troponin I: 0.17 ng/mL (ref <0.03)
Troponin I: 0.19 ng/mL (ref <0.03)
Troponin I: 0.16 ng/mL (ref <0.03)

## 2015-08-25 LAB — TSH: TSH: 0.388 u[IU]/mL (ref 0.350–4.500)

## 2015-08-25 LAB — VITAMIN B12: Vitamin B-12: 542 pg/mL (ref 180–914)

## 2015-08-25 LAB — MAGNESIUM: Magnesium: 1.8 mg/dL (ref 1.7–2.4)

## 2015-08-25 MED ORDER — OXYCODONE HCL 5 MG PO TABS
5.0000 mg | ORAL_TABLET | Freq: Once | ORAL | Status: AC
  Administered 2015-08-25: 5 mg via ORAL
  Filled 2015-08-25: qty 1

## 2015-08-25 MED ORDER — ENSURE ENLIVE PO LIQD
237.0000 mL | Freq: Two times a day (BID) | ORAL | Status: DC
  Administered 2015-08-25 (×2): 237 mL via ORAL

## 2015-08-25 MED ORDER — OXYCODONE HCL 10 MG PO TABS: 10.0000 mg | ORAL_TABLET | Freq: Four times a day (QID) | ORAL | Status: DC

## 2015-08-25 MED ORDER — OXYCODONE HCL 5 MG PO TABS
10.0000 mg | ORAL_TABLET | Freq: Four times a day (QID) | ORAL | Status: DC | PRN
  Administered 2015-08-25 – 2015-08-26 (×4): 10 mg via ORAL
  Filled 2015-08-25 (×4): qty 2

## 2015-08-25 NOTE — Clinical Social Work Note (Signed)
CSW acknowledges consult to set up cardiac rehab. Will notify RNCM.  CSW signing off. Consult if any social work needs arise.  Dayton Scrape, [place]

## 2015-08-25 NOTE — Progress Notes (Signed)
Initial Nutrition Assessment  DOCUMENTATION CODES:   Obesity unspecified  INTERVENTION:    Continue Ensure Enlive po BID, each supplement provides 350 kcal and 20 grams of protein  NUTRITION DIAGNOSIS:   Inadequate oral intake related to poor appetite as evidenced by per patient/family report.  GOAL:   Patient will meet greater than or equal to 90% of their needs  MONITOR:   PO intake, Supplement acceptance, Labs, Weight trends, I & O's  REASON FOR ASSESSMENT:   Malnutrition Screening Tool    ASSESSMENT:   60 y.o. 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 Patient presents to the ED with c/o fatigue and sleepiness per wife.  Patient reports poor intake for the past few months due to poor appetite. He has lost 8% of usual weight in the past 3 months and 11% in the past 6 months. Weight loss could partially be related to diuresis / fluid loss. Nutrition focused physical exam completed.  No muscle or subcutaneous fat depletion noticed. Appetite and PO intake have improved since PTA. Patient likes the Ensure supplements, will continue BID. Labs reviewed: potassium low at 3.1 Medications reviewed and include Lasix, KCl.  Diet Order:  Diet heart healthy/carb modified Room service appropriate?: Yes; Fluid consistency:: Thin  Skin:  Reviewed, no issues  Last BM:  6/30  Height:   Ht Readings from Last 1 Encounters:  08/25/15 5\' 9"  (1.753 m)    Weight:   Wt Readings from Last 1 Encounters:  08/25/15 219 lb 2.2 oz (99.4 kg)    Ideal Body Weight:  72.7 kg  BMI:  Body mass index is 32.35 kg/(m^2).  Estimated Nutritional Needs:   Kcal:  2000-2200  Protein:  100-115 gm  Fluid:  2-2.2 L  EDUCATION NEEDS:   No education needs identified at this time  Molli Barrows, [place], [place], [place] Pager [phone] After Hours Pager [phone]

## 2015-08-25 NOTE — Progress Notes (Addendum)
PROGRESS NOTE    Thomas Mcclure  [id number] DOB: [birth year] DOA: 08/24/2015 PCP: [name], MD   Brief Narrative:  HPI on 08/24/2015 by Dr. [name] is a 60 y.o. 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 Patient presents to the ED with c/o fatigue and sleepiness per wife. Patient has been falling asleep early since the time of his heart cath, worse today. 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   -CT head unremarkable -Chest x-ray unremarkable for infection  CAD -Currently denies chest pain -Continue aspirin, ticagrelor, lisinopril, metoprolol, statin -Troponin flat, 0.17, 0.19  Essential hypertension -Continue metoprolol, lisinopril  Hyperlipidemia -Continue statin  Chronic back pain/leg weakness -Patient does have neuromuscular disorder and has pain stimulator -PT consulted and recommended home health -Continue Mobic, Lyrica -Continue flexeril, ultram PRN  Depression -Continue Cymbalta  GERD -Continue Protonix  Hypokalemia -Possible due to diuretics -Replace and continue to monitor BMP  Diabetes mellitus, type 2 -Continue metformin  DVT Prophylaxis  Lovenox  Code Status: Full  Family Communication: Wife at bedside  Disposition Plan: Admitted for observation  Consultants None  Procedures  None  Antibiotics   Anti-infectives    None      Subjective:   Thomas Mcclure seen and examined today.  She states he's feeling mildly better than yesterday. Denies chest pain, shortness of breath, abdominal pain. Does continue to feel weak but is able to move  his legs today.  Objective:   Filed Vitals:   08/25/15 0041 08/25/15 0449 08/25/15 1022 08/25/15 1124  BP: 118/77 130/89 140/92   Pulse: 65 65 76   Temp: 97.7 F (36.5 C) 97.8 F (36.6 C)    TempSrc: Oral Oral    Resp: 20 20    Height: 5\' 9"  (1.753 m)     Weight: 82.146 kg (181 lb 1.6 oz)   99.4 kg (219 lb 2.2 oz)  SpO2: 99% 100%      Intake/Output Summary (Last 24 hours) at 08/25/15 1231 Last data filed at 08/24/15 1906  Gross per 24 hour  Intake      0 ml  Output    800 ml  Net   -800 ml   Filed Weights   08/24/15 1722 08/25/15 0041 08/25/15 1124  Weight: 82.555 kg (182 lb) 82.146 kg (181 lb 1.6 oz) 99.4 kg (219 lb 2.2 oz)    Exam  General: Well developed, well nourished, NAD  HEENT: NCAT, mucous membranes moist.   Cardiovascular: S1 S2 auscultated, no rubs, murmurs or gallops. RRR  Respiratory: Clear to auscultation bilaterally   Abdomen: Soft, nontender, nondistended, + bowel sounds  Extremities: warm dry without cyanosis clubbing or edema  Neuro: AAOx3, nonfocal  Psych: Normal affect and demeanor    Data Reviewed: I have personally reviewed following labs and imaging studies  CBC:  Recent Labs Lab 08/24/15 1800  WBC 7.5  NEUTROABS 4.0  HGB 12.2*  HCT 37.4*  MCV 81.8  PLT [id number]   Basic Metabolic Panel:  Recent Labs Lab 08/24/15 1800  NA 141  K 3.1*  CL 109  CO2 25  GLUCOSE 99  BUN  15  CREATININE 1.14  CALCIUM 9.3   GFR: Estimated Creatinine Clearance: 80.1 mL/min (by C-G formula based on Cr of 1.14). Liver Function Tests:  Recent Labs Lab 08/24/15 1800  AST 20  ALT 24  ALKPHOS 68  BILITOT 0.6  PROT 6.6  ALBUMIN 3.8   No results for input(s): LIPASE, AMYLASE in the last 168 hours. No results for input(s): AMMONIA in the last 168 hours. Coagulation Profile: No results for input(s): INR, PROTIME in the last 168 hours. Cardiac Enzymes:  Recent Labs Lab 08/25/15 0106 08/25/15 0523  TROPONINI 0.17* 0.19*   BNP (last 3  results) No results for input(s): PROBNP in the last 8760 hours. HbA1C: No results for input(s): HGBA1C in the last 72 hours. CBG:  Recent Labs Lab 08/24/15 1730  GLUCAP 117*   Lipid Profile: No results for input(s): CHOL, HDL, LDLCALC, TRIG, CHOLHDL, LDLDIRECT in the last 72 hours. Thyroid Function Tests:  Recent Labs  08/25/15 0731  TSH 0.388   Anemia Panel: No results for input(s): VITAMINB12, FOLATE, FERRITIN, TIBC, IRON, RETICCTPCT in the last 72 hours. Urine analysis:    Component Value Date/Time   COLORURINE YELLOW 08/24/2015 1905   APPEARANCEUR CLEAR 08/24/2015 1905   LABSPEC 1.010 08/24/2015 1905   PHURINE 6.0 08/24/2015 1905   GLUCOSEU NEGATIVE 08/24/2015 1905   HGBUR NEGATIVE 08/24/2015 1905   BILIRUBINUR NEGATIVE 08/24/2015 1905   KETONESUR NEGATIVE 08/24/2015 1905   PROTEINUR NEGATIVE 08/24/2015 1905   UROBILINOGEN 1.0 07/26/2012 1552   NITRITE NEGATIVE 08/24/2015 1905   LEUKOCYTESUR NEGATIVE 08/24/2015 1905   Sepsis Labs: @LABRCNTIP (procalcitonin:4,lacticidven:4)  )No results found for this or any previous visit (from the past 240 hour(s)).    Radiology Studies: Dg Chest 2 View  08/24/2015  CLINICAL DATA:  60 year old male with central chest pain. EXAM: CHEST  2 VIEW COMPARISON:  Chest radiograph dated 08/05/2015 FINDINGS: Two views of the chest demonstrate bibasilar atelectatic changes. There is no focal consolidation, pleural effusion, or pneumothorax. The cardiac silhouette is within normal limits. No acute osseous pathology identified. A spinal stimulator is noted over the mid thoracic spine. IMPRESSION: No active cardiopulmonary disease. Electronically Signed   By: Anner Crete M.D.   On: 08/24/2015 18:21   Ct Head Wo Contrast  08/24/2015  CLINICAL DATA:  60 year old male with altered mental status. EXAM: CT HEAD WITHOUT CONTRAST CT CERVICAL SPINE WITHOUT CONTRAST TECHNIQUE: Multidetector CT imaging of the head and cervical spine was performed  following the standard protocol without intravenous contrast. Multiplanar CT image reconstructions of the cervical spine were also generated. COMPARISON:  Head CT dated 12/01/2011 FINDINGS: CT HEAD FINDINGS The ventricles and sulci are appropriate in size for the patient's age. There is no intracranial hemorrhage. No mass effect or midline shift identified. The gray-white matter differentiation is preserved. There is no extra-axial fluid collection. Mild mucoperiosteal thickening of paranasal sinuses. No air-fluid levels. The mastoid air cells are clear. The calvarium is intact. CT CERVICAL SPINE FINDINGS There is no acute fracture or subluxation of the cervical spine.There is multilevel degenerative changes most prominent at C5-C6.The odontoid and spinous processes are intact.There is normal anatomic alignment of the C1-C2 lateral masses. The visualized soft tissues appear unremarkable. IMPRESSION: No acute intracranial pathology. No acute/traumatic cervical spine pathology. Electronically Signed   By: Anner Crete M.D.   On: 08/24/2015 22:27   Ct Cervical Spine Wo Contrast  08/24/2015  CLINICAL DATA:  60 year old male with altered mental status. EXAM: CT HEAD WITHOUT CONTRAST CT CERVICAL SPINE WITHOUT  CONTRAST TECHNIQUE: Multidetector CT imaging of the head and cervical spine was performed following the standard protocol without intravenous contrast. Multiplanar CT image reconstructions of the cervical spine were also generated. COMPARISON:  Head CT dated 12/01/2011 FINDINGS: CT HEAD FINDINGS The ventricles and sulci are appropriate in size for the patient's age. There is no intracranial hemorrhage. No mass effect or midline shift identified. The gray-white matter differentiation is preserved. There is no extra-axial fluid collection. Mild mucoperiosteal thickening of paranasal sinuses. No air-fluid levels. The mastoid air cells are clear. The calvarium is intact. CT CERVICAL SPINE FINDINGS There is no  acute fracture or subluxation of the cervical spine.There is multilevel degenerative changes most prominent at C5-C6.The odontoid and spinous processes are intact.There is normal anatomic alignment of the C1-C2 lateral masses. The visualized soft tissues appear unremarkable. IMPRESSION: No acute intracranial pathology. No acute/traumatic cervical spine pathology. Electronically Signed   By: Anner Crete M.D.   On: 08/24/2015 22:27     Scheduled Meds: . aspirin EC  81 mg Oral Daily  . atorvastatin  80 mg Oral q1800  . DULoxetine  60 mg Oral Daily  . enoxaparin (LOVENOX) injection  40 mg Subcutaneous Q24H  . feeding supplement (ENSURE ENLIVE)  237 mL Oral BID BM  . furosemide  40 mg Oral Daily  . lisinopril  5 mg Oral Daily  . meloxicam  15 mg Oral Daily  . metFORMIN  750 mg Oral Q supper  . metoprolol tartrate  12.5 mg Oral BID  . pantoprazole  80 mg Oral Daily  . potassium chloride SA  40 mEq Oral Once  . pregabalin  100 mg Oral TID  . ticagrelor  90 mg Oral BID   Continuous Infusions:      Time Spent in minutes   30 minutes  Chrisie Jankovich D.O. on 08/25/2015 at 12:31 PM  Between 7am to 7pm - Pager - [phone]  After 7pm go to [email or web address] - password [email or web address]  And look for the night coverage person covering for me after hours  [facility] Office  [phone]

## 2015-08-25 NOTE — Evaluation (Signed)
Physical Therapy Evaluation Patient Details Name: Thomas Mcclure MRN: [id number] DOB: 06-25-1955 Today's Date: 08/25/2015   History of Present Illness  60 y.o. male admitted to [facility] on 08/24/15 for fatigue and sleepiness.  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   Cardiac workup in progress.  Pt with other significant PMHx of chronic back pain s/p surgery x 2 with implantable stimulator, neck surgery, HTN, DM, CAD, MI, hypokalemia, coronary stent and knee surgery.    Clinical Impression  Pt was able to ambulate around the unit with RW and one person assist.  He is weak, particularly on his right knee/ankle, but also generally bil uppers and bil lowers.  He would benefit from HHPT at discharge and then transition to OP cardiac rehab program.   PT to follow acutely for deficits listed below.       Follow Up Recommendations Home health PT;Supervision for mobility/OOB    Equipment Recommendations  None recommended by PT    Recommendations for Other Services       Precautions / Restrictions Precautions Precautions: Fall Precaution Comments: pt fell just prior to this admission      Mobility  Bed Mobility Overal bed mobility: Modified Independent                Transfers Overall transfer level: Needs assistance Equipment used: Rolling walker (2 wheeled) Transfers: Sit to/from Stand Sit to Stand: Supervision         General transfer comment: supervision for safety  Ambulation/Gait Ambulation/Gait assistance: Min assist Ambulation Distance (Feet): 150 Feet Assistive device: Rolling walker (2 wheeled) Gait Pattern/deviations: Step-through pattern;Trunk flexed Gait velocity: decreased Gait velocity interpretation: Below normal speed for age/gender General Gait Details: Min assist to support trunk during gait due to multiple brief standing rest breaks and  pt reporting feeing weak, especially in his right leg.       Balance Overall balance assessment: Needs assistance Sitting-balance support: Feet supported;No upper extremity supported Sitting balance-Leahy Scale: Good     Standing balance support: Bilateral upper extremity supported;No upper extremity supported;Single extremity supported Standing balance-Leahy Scale: Fair                               Pertinent Vitals/Pain Pain Assessment: No/denies pain    Home Living Family/patient expects to be discharged to:: Private residence Living Arrangements: Spouse/significant other Available Help at Discharge: Family;Available 24 hours/day Type of Home: House         Home Equipment: Walker - 2 wheels;Cane - single point 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      Prior Function Level of Independence: Independent with assistive device(s)         Comments: uses RW for mobility     Hand Dominance   Dominant Hand: Right    Extremity/Trunk Assessment   Upper Extremity Assessment: Generalized weakness           Lower Extremity Assessment: RLE deficits/detail;LLE deficits/detail RLE Deficits / Details: bil legs with generalized weakness, however right knee extension, flexion 3+/5 and plantarflexion 3+/5 LLE Deficits / Details: generally 4/5 per gross seated MMT.    Cervical / Trunk Assessment: Other exceptions  Communication   Communication: No difficulties  Cognition Arousal/Alertness: Awake/alert Behavior During Therapy: WFL for tasks assessed/performed Overall Cognitive Status: Within  Functional Limits for tasks assessed (per wife report he was very "out of it" last night)                               Assessment/Plan    PT Assessment Patient needs continued PT services  PT Diagnosis Difficulty walking;Abnormality of gait;Generalized weakness   PT Problem List Decreased strength;Decreased activity  tolerance;Decreased balance;Decreased mobility;Decreased knowledge of use of DME  PT Treatment Interventions DME instruction;Stair training;Gait training;Functional mobility training;Therapeutic activities;Therapeutic exercise;Balance training;Neuromuscular re-education;Patient/family education   PT Goals (Current goals can be found in the Care Plan section) Acute Rehab PT Goals Patient Stated Goal: to go to church tomorrow  PT Goal Formulation: With patient/family Time For Goal Achievement: 09/08/15 Potential to Achieve Goals: Good    Frequency Min 3X/week           End of Session Equipment Utilized During Treatment: Gait belt Activity Tolerance: Patient limited by fatigue Patient left: in bed;with call bell/phone within reach;with family/visitor present Nurse Communication: Mobility status    Functional Assessment Tool Used: assist level Functional Limitation: Mobility: Walking and moving around Mobility: Walking and Moving Around Current Status [phone]): At least 20 percent but less than 40 percent impaired, limited or restricted Mobility: Walking and Moving Around Goal Status [phone]): At least 1 percent but less than 20 percent impaired, limited or restricted    Time: 0911-1001 (he was chatting a bit, only charged 2 units) PT Time Calculation (min) (ACUTE ONLY): 50 min   Charges:   PT Evaluation $PT Eval Moderate Complexity: 1 Procedure PT Treatments $Gait Training: 8-22 mins   PT G Codes:   PT G-Codes **NOT FOR INPATIENT CLASS** Functional Assessment Tool Used: assist level Functional Limitation: Mobility: Walking and moving around Mobility: Walking and Moving Around Current Status [id number]): At least 20 percent but less than 40 percent impaired, limited or restricted Mobility: Walking and Moving Around Goal Status [phone]): At least 1 percent but less than 20 percent impaired, limited or restricted    Taytem Ghattas B. Webb, [place], DPT [phone]   08/25/2015, 10:12 AM

## 2015-08-25 NOTE — Progress Notes (Signed)
  Echocardiogram 2D Echocardiogram has been performed.  Thomas Mcclure 08/25/2015, 5:42 PM

## 2015-08-26 DIAGNOSIS — R5383 Other fatigue: Secondary | ICD-10-CM | POA: Diagnosis not present

## 2015-08-26 DIAGNOSIS — E785 Hyperlipidemia, unspecified: Secondary | ICD-10-CM | POA: Diagnosis not present

## 2015-08-26 DIAGNOSIS — E1159 Type 2 diabetes mellitus with other circulatory complications: Secondary | ICD-10-CM | POA: Diagnosis not present

## 2015-08-26 DIAGNOSIS — M549 Dorsalgia, unspecified: Secondary | ICD-10-CM | POA: Diagnosis not present

## 2015-08-26 DIAGNOSIS — R531 Weakness: Secondary | ICD-10-CM | POA: Diagnosis not present

## 2015-08-26 LAB — ECHOCARDIOGRAM LIMITED
E decel time: 211 msec
E/e' ratio: 5.42
FS: 26 % — AB (ref 28–44)
Height: 69 in
IVS/LV PW RATIO, ED: 1.12
LA ID, A-P, ES: 37 mm
LA diam end sys: 37 mm
LA diam index: 1.66 cm/m2
LV E/e' medial: 5.42
LV E/e'average: 5.42
LV PW d: 10.2 mm — AB (ref 0.6–1.1)
LV e' LATERAL: 8.59 cm/s
LVOT area: 4.15 cm2
LVOT diameter: 23 mm
MV Dec: 211
MV pk A vel: 71.8 m/s
MV pk E vel: 46.6 m/s
TDI e' lateral: 8.59
TDI e' medial: 5.87
Weight: 3506.2 oz

## 2015-08-26 LAB — BASIC METABOLIC PANEL
Anion gap: 9 (ref 5–15)
BUN: 11 mg/dL (ref 6–20)
CO2: 23 mmol/L (ref 22–32)
Calcium: 9.2 mg/dL (ref 8.9–10.3)
Chloride: 109 mmol/L (ref 101–111)
Creatinine, Ser: 1 mg/dL (ref 0.61–1.24)
GFR calc Af Amer: >60 mL/min (ref >60)
GFR calc non Af Amer: >60 mL/min (ref >60)
Glucose, Bld: 166 mg/dL — ABNORMAL HIGH (ref 65–99)
Potassium: 3.1 mmol/L — ABNORMAL LOW (ref 3.5–5.1)
Sodium: 141 mmol/L (ref 135–145)

## 2015-08-26 LAB — POTASSIUM: Potassium: 3.7 mmol/L (ref 3.5–5.1)

## 2015-08-26 MED ORDER — ENSURE ENLIVE PO LIQD: 237.0000 mL | Freq: Two times a day (BID) | ORAL | Status: DC

## 2015-08-26 MED ORDER — POTASSIUM CHLORIDE CRYS ER 20 MEQ PO TBCR
40.0000 meq | EXTENDED_RELEASE_TABLET | Freq: Once | ORAL | Status: AC
  Administered 2015-08-26: 40 meq via ORAL
  Filled 2015-08-26: qty 2

## 2015-08-26 MED ORDER — MAGNESIUM SULFATE 2 GM/50ML IV SOLN
2.0000 g | Freq: Once | INTRAVENOUS | Status: AC
  Administered 2015-08-26: 2 g via INTRAVENOUS
  Filled 2015-08-26: qty 50

## 2015-08-26 MED ORDER — POTASSIUM CHLORIDE ER 10 MEQ PO TBCR: 10.0000 meq | EXTENDED_RELEASE_TABLET | Freq: Once | ORAL | Status: DC

## 2015-08-26 MED ORDER — POTASSIUM CHLORIDE ER 20 MEQ PO TBCR: 10.0000 meq | EXTENDED_RELEASE_TABLET | Freq: Once | ORAL | Status: DC

## 2015-08-26 NOTE — Discharge Instructions (Signed)
Fatigue Fatigue is feeling tired all of the time, a lack of energy, or a lack of motivation. Occasional or mild fatigue is often a normal response to activity or life in general. However, long-lasting (chronic) or extreme fatigue may indicate an underlying medical condition. HOME CARE INSTRUCTIONS  Watch your fatigue for any changes. The following actions may help to lessen any discomfort you are feeling:  Talk to your health care provider about how much sleep you need each night. Try to get the required amount every night.  Take medicines only as directed by your health care provider.  Eat a healthy and nutritious diet. Ask your health care provider if you need help changing your diet.  Drink enough fluid to keep your urine clear or pale yellow.  Practice ways of relaxing, such as yoga, meditation, massage therapy, or acupuncture.  Exercise regularly.   Change situations that cause you stress. Try to keep your work and personal routine reasonable.  Do not abuse illegal drugs.  Limit alcohol intake to no more than 1 drink per day for nonpregnant women and 2 drinks per day for men. One drink equals 12 ounces of beer, 5 ounces of wine, or 1 ounces of hard liquor.  Take a multivitamin, if directed by your health care provider. SEEK MEDICAL CARE IF:   Your fatigue does not get better.  You have a fever.   You have unintentional weight loss or gain.  You have headaches.   You have difficulty:   Falling asleep.  Sleeping throughout the night.  You feel angry, guilty, anxious, or sad.   You are unable to have a bowel movement (constipation).   You skin is dry.   Your legs or another part of your body is swollen.  SEEK IMMEDIATE MEDICAL CARE IF:   You feel confused.   Your vision is blurry.  You feel faint or pass out.   You have a severe headache.   You have severe abdominal, pelvic, or back pain.   You have chest pain, shortness of breath, or an  irregular or fast heartbeat.   You are unable to urinate or you urinate less than normal.   You develop abnormal bleeding, such as bleeding from the rectum, vagina, nose, lungs, or nipples.  You vomit blood.   You have thoughts about harming yourself or committing suicide.   You are worried that you might harm someone else.    This information is not intended to replace advice given to you by your health care provider. Make sure you discuss any questions you have with your health care provider.   Document Released: 12/08/2006 Document Revised: 03/03/2014 Document Reviewed: 06/14/2013 Elsevier Interactive Patient Education 2016 [employer].  Hypokalemia Hypokalemia means that the amount of potassium in the blood is lower than normal.Potassium is a chemical, called an electrolyte, that helps regulate the amount of fluid in the body. It also stimulates muscle contraction and helps nerves function properly.Most of the body's potassium is inside of cells, and only a very small amount is in the blood. Because the amount in the blood is so small, minor changes can be life-threatening. CAUSES  Antibiotics.  Diarrhea or vomiting.  Using laxatives too much, which can cause diarrhea.  Chronic kidney disease.  Water pills (diuretics).  Eating disorders (bulimia).  Low magnesium level.  Sweating a lot. SIGNS AND SYMPTOMS  Weakness.  Constipation.  Fatigue.  Muscle cramps.  Mental confusion.  Skipped heartbeats or irregular heartbeat (palpitations).  Tingling or  numbness. DIAGNOSIS  Your health care provider can diagnose hypokalemia with blood tests. In addition to checking your potassium level, your health care provider may also check other lab tests. TREATMENT Hypokalemia can be treated with potassium supplements taken by mouth or adjustments in your current medicines. If your potassium level is very low, you may need to get potassium through a vein (IV) and be  monitored in the hospital. A diet high in potassium is also helpful. Foods high in potassium are:  Nuts, such as peanuts and pistachios.  Seeds, such as sunflower seeds and pumpkin seeds.  Peas, lentils, and lima beans.  Whole grain and bran cereals and breads.  Fresh fruit and vegetables, such as apricots, avocado, bananas, cantaloupe, kiwi, oranges, tomatoes, asparagus, and potatoes.  Orange and tomato juices.  Red meats.  Fruit yogurt. HOME CARE INSTRUCTIONS  Take all medicines as prescribed by your health care provider.  Maintain a healthy diet by including nutritious food, such as fruits, vegetables, nuts, whole grains, and lean meats.  If you are taking a laxative, be sure to follow the directions on the label. SEEK MEDICAL CARE IF:  Your weakness gets worse.  You feel your heart pounding or racing.  You are vomiting or having diarrhea.  You are diabetic and having trouble keeping your blood glucose in the normal range. SEEK IMMEDIATE MEDICAL CARE IF:  You have chest pain, shortness of breath, or dizziness.  You are vomiting or having diarrhea for more than 2 days.  You faint. MAKE SURE YOU:   Understand these instructions.  Will watch your condition.  Will get help right away if you are not doing well or get worse.   This information is not intended to replace advice given to you by your health care provider. Make sure you discuss any questions you have with your health care provider.   Document Released: 02/10/2005 Document Revised: 03/03/2014 Document Reviewed: 08/13/2012 Elsevier Interactive Patient Education [employer].

## 2015-08-26 NOTE — Evaluation (Signed)
Occupational Therapy Evaluation and Discharge Patient Details Name: Thomas Mcclure MRN: [id number] DOB: August 24, 1955 Today's Date: 08/26/2015    History of Present Illness 60 y.o. male admitted to [facility] on 08/24/15 for fatigue and sleepiness.  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   Cardiac workup in progress.  Pt with other significant PMHx of chronic back pain s/p surgery x 2 with implantable stimulator, neck surgery, HTN, DM, CAD, MI, hypokalemia, coronary stent and knee surgery.     Clinical Impression   Pt reports he was independent with ADLs PTA. Currently pt is overall supervision for safety with ADLs and functional mobility; quickly approaching mod I. No LOB or unsteadiness noted with functional activities. Pt reports he feels generally weak overall (LEs>UEs) but has noticed improvements over the past few days. Pt planning to d/c home with 24/7 supervision from his wife. No further acute OT needs identified; signing off at this time. Please re-consult if needs change. Thank you for this referral.     Follow Up Recommendations  No OT follow up;Supervision - Intermittent    Equipment Recommendations  None recommended by OT    Recommendations for Other Services       Precautions / Restrictions Precautions Precautions: Fall Precaution Comments: pt fell just prior to this admission Restrictions Weight Bearing Restrictions: No      Mobility Bed Mobility Overal bed mobility: Modified Independent                Transfers Overall transfer level: Needs assistance Equipment used: Rolling walker (2 wheeled) Transfers: Sit to/from Stand Sit to Stand: Supervision         General transfer comment: supervision for safety    Balance Overall balance assessment: No apparent balance deficits (not formally assessed)                                           ADL Overall ADL's : Needs assistance/impaired Eating/Feeding: Independent;Sitting   Grooming: Supervision/safety;Standing   Upper Body Bathing: Modified independent;Sitting   Lower Body Bathing: Supervison/ safety;Sit to/from stand   Upper Body Dressing : Modified independent;Sitting   Lower Body Dressing: Supervision/safety;Sit to/from stand   Toilet Transfer: Supervision/safety;Ambulation;Regular Toilet;RW   Toileting- Clothing Manipulation and Hygiene: Supervision/safety;Sit to/from stand   Tub/ Shower Transfer: Supervision/safety;Tub transfer;Ambulation;Shower Technical sales engineer Details (indicate cue type and reason): Simulated. No LOB or unsteadiness Functional mobility during ADLs: Supervision/safety;Rolling walker General ADL Comments: Pt reports he feels overall just very weak; LEs>UEs. Has noticed improvements in strength over the past few days.     Vision Vision Assessment?: No apparent visual deficits   Perception     Praxis      Pertinent Vitals/Pain Pain Assessment: No/denies pain     Hand Dominance Right   Extremity/Trunk Assessment Upper Extremity Assessment Upper Extremity Assessment: Generalized weakness   Lower Extremity Assessment Lower Extremity Assessment: Defer to PT evaluation   Cervical / Trunk Assessment Cervical / Trunk Assessment: Other exceptions Cervical / Trunk Exceptions: h/o multiple lumbar surgeries, neck surgery   Communication Communication Communication: No difficulties   Cognition Arousal/Alertness: Awake/alert Behavior During Therapy: WFL for tasks assessed/performed Overall Cognitive Status: Within Functional Limits for tasks assessed  General Comments       Exercises       Shoulder Instructions      Home Living Family/patient expects to be discharged to:: Private residence Living Arrangements: Spouse/significant other Available Help at  Discharge: Family;Available 24 hours/day Type of Home: House       Home Layout: One level     Bathroom Shower/Tub: Tub/shower unit Shower/tub characteristics: Curtain Biochemist, clinical: Standard     Home Equipment: Environmental consultant - 2 wheels;Cane - single point;Shower seat   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        Prior Functioning/Environment Level of Independence: Independent with assistive device(s)        Comments: uses RW for mobility, sits on seat in shower for bathing    OT Diagnosis: Generalized weakness   OT Problem List:     OT Treatment/Interventions:      OT Goals(Current goals can be found in the care plan section) Acute Rehab OT Goals Patient Stated Goal: home today OT Goal Formulation: All assessment and education complete, DC therapy  OT Frequency:     Barriers to D/C:            Co-evaluation              End of Session Equipment Utilized During Treatment: Rolling walker  Activity Tolerance: Patient tolerated treatment well Patient left: in bed;with call bell/phone within reach;with family/visitor present   Time: [id number] OT Time Calculation (min): 15 min Charges:  OT General Charges $OT Visit: 1 Procedure OT Evaluation $OT Eval Low Complexity: 1 Procedure G-Codes: OT G-codes **NOT FOR INPATIENT CLASS** Functional Assessment Tool Used: Clinical judgement Functional Limitation: Self care Self Care Current Status [id number]): At least 1 percent but less than 20 percent impaired, limited or restricted Self Care Goal Status [id number]): At least 1 percent but less than 20 percent impaired, limited or restricted Self Care Discharge Status [phone]): At least 1 percent but less than 20 percent impaired, limited or restricted   Binnie Kand M.S., OTR/L Pager: [phone]  08/26/2015, 11:50 AM

## 2015-08-26 NOTE — Care Management Note (Signed)
Case Management Note  Patient Details  Name: Thomas Mcclure MRN: [id number] Date of Birth: 10/09/1955  Subjective/Objective:                  fatigue and sleepiness Action/Plan: Discharge planning Expected Discharge Date:  08/26/15               Expected Discharge Plan:  Home w Home Health Services  In-House Referral:     Discharge planning Services  CM Consult  Post Acute Care Choice:  Home Health Choice offered to:  Patient  DME Arranged:    DME Agency:     HH Arranged:  PT HH Agency:  [facility]  Status of Service:  Completed, signed off  If discussed at Long Length of Stay Meetings, dates discussed:    Additional Comments: Cm met with pt in room to offer choice of home health agency.  Pt chooses [facility] to render HHPT.  Referral called to [facility] rep, tiffany. No other CM needs were communicated. Jeffries, Sarah Christine, RN 08/26/2015, 4:33 PM  

## 2015-08-26 NOTE — Discharge Summary (Signed)
Physician Discharge Summary  Thomas Mcclure [id number] DOB: 14-Apr-1955 DOA: 08/24/2015  PCP: [name], MD  Admit date: 08/24/2015 Discharge date: 08/26/2015  Time spent: 45 minutes  Recommendations for Outpatient Follow-up:  Patient will be discharged to home with home health PT.  Patient will need to follow up with primary care provider within one week of discharge, repeat BMP.  Follow up with [name], cardiologist.  Follow up with Dr. [name], neurosurgeon..  Patient should continue medications as prescribed.  Patient should follow a heart healthy/carb modified diet.   Discharge Diagnoses:  Generalized fatigue and weakness CAD Essential hypertension Hyperlipidemia Chronic back pain/leg weakness Depression GERD Hypokalemia Diabetes mellitus, type 2  Discharge Condition: Stable  Diet recommendation: Heart healthy/carb modified  Filed Weights   08/24/15 1722 08/25/15 0041 08/25/15 1124  Weight: 82.555 kg (182 lb) 82.146 kg (181 lb 1.6 oz) 99.4 kg (219 lb 2.2 oz)    History of present illness:  on 08/24/2015 by Dr. Marlowe Mcclure is a 60 y.o. 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 Patient presents to the ED with c/o fatigue and sleepiness per wife. Patient has been falling asleep early since the time of his heart cath, worse today.  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  -CT head unremarkable -Chest x-ray unremarkable for infection -Patient has had several back surgeries.  Follows with Neurosurgeon Dr. [name].  -Echocardiogram: EF [id number], grade 1 diastolic dysfunction (EF slightly  worse as compared to echocardiogram in May, however, EF improved compared to EF on catheterization 6/13/20017)  CAD -Currently denies chest pain -Continue aspirin, ticagrelor, lisinopril, metoprolol, statin -Troponin flat, 0.17, 0.19 -Echo findings as above -Discussed case with Dr. [name], patient ok for discharge and follow up with [name].   Essential hypertension -Continue metoprolol, lisinopril  Hyperlipidemia -Continue statin  Chronic back pain/leg weakness -Patient does have neuromuscular disorder and has pain stimulator -PT consulted and recommended home health -Continue Mobic, Lyrica -Continue flexeril, ultram PRN  Depression -Continue Cymbalta  GERD -Continue Protonix  Hypokalemia -Possible due to diuretics -Continue KCl supplementation -Magnesium 1.8, supplemented  -Repeat potassium 3.7 -Repeat BMP in one week  Diabetes mellitus, type 2 -Continue metformin  Procedures: Echocardiogram  Consultations: Cardiology, Dr. [name], via phone  Discharge Exam: Filed Vitals:   08/25/15 2037 08/26/15 0326  BP: 133/85 139/90  Pulse: 98 89  Temp: 98.2 F (36.8 C) 97.5 F (36.4 C)  Resp: 18 18    Exam  General: Well developed, well nourished, No distress  HEENT: NCAT, mucous membranes moist.   Cardiovascular: S1 S2 auscultated, no murmurs, RRR  Respiratory: Clear to auscultation bilaterally  Abdomen: Soft, nontender, nondistended, + bowel sounds  Extremities: warm dry without cyanosis clubbing or edema  Neuro: AAOx3, nonfocal  Psych: Normal affect and demeanor, pleasant   Discharge Instructions      Discharge Instructions    Discharge instructions    Complete by:  As directed   Patient will be discharged to home with home health PT. Patient will need to follow up with primary care provider within one week of discharge, repeat BMP. Follow up with [name], cardiologist. Follow up  with Dr. [name], neurosurgeon.. Patient should continue  medications as prescribed. Patient should follow a heart healthy/carb modified diet.            Medication List    TAKE these medications        aspirin EC 81 MG tablet  Take 1 tablet (81 mg total) by mouth daily.     atorvastatin 80 MG tablet  Commonly known as:  LIPITOR  Take 1 tablet (80 mg total) by mouth daily at 6 PM.     cyclobenzaprine 10 MG tablet  Commonly known as:  FLEXERIL  Take 10 mg by mouth 3 (three) times daily as needed for muscle spasms.     diazepam 10 MG tablet  Commonly known as:  VALIUM  Take 10 mg by mouth 2 (two) times daily. For spasms     DULoxetine 60 MG capsule  Commonly known as:  CYMBALTA  Take 60 mg by mouth daily.     feeding supplement (ENSURE ENLIVE) Liqd  Take 237 mLs by mouth 2 (two) times daily between meals.     furosemide 40 MG tablet  Commonly known as:  LASIX  Take 1 tablet (40 mg total) by mouth daily.     lisinopril 5 MG tablet  Commonly known as:  PRINIVIL,ZESTRIL  Take 1 tablet (5 mg total) by mouth daily.     meloxicam 15 MG tablet  Commonly known as:  MOBIC  Take 15 mg by mouth daily.     metFORMIN 750 MG 24 hr tablet  Commonly known as:  GLUCOPHAGE-XR  Take 750 mg by mouth daily with supper.     metoprolol tartrate 25 MG tablet  Commonly known as:  LOPRESSOR  Take 0.5 tablets (12.5 mg total) by mouth 2 (two) times daily.     nitroGLYCERIN 0.4 MG SL tablet  Commonly known as:  NITROSTAT  Place 1 tablet (0.4 mg total) under the tongue every 5 (five) minutes as needed for chest pain.     omeprazole 40 MG capsule  Commonly known as:  PRILOSEC  Take 40 mg by mouth daily.     Oxycodone HCl 10 MG Tabs  Take 1 tablet (10 mg total) by mouth every 4 (four) hours.     polyethylene glycol powder powder  Commonly known as:  GLYCOLAX/MIRALAX  Take 17 g by mouth daily as needed (constipation). Mix in 8 oz water, juice, soda, coffee or tea and drink     Potassium Chloride ER 20 MEQ Tbcr  Commonly known as:  KLOR-CON  10  Take 10 mEq by mouth once.     pregabalin 100 MG capsule  Commonly known as:  LYRICA  Take 100 mg by mouth 3 (three) times daily.     ticagrelor 90 MG Tabs tablet  Commonly known as:  BRILINTA  Take 1 tablet (90 mg total) by mouth 2 (two) times daily.     topiramate 50 MG tablet  Commonly known as:  TOPAMAX  Take 50 mg by mouth 2 (two) times daily as needed (nerve pain). For nerve pain     traMADol 50 MG tablet  Commonly known as:  ULTRAM  Take 100 mg by mouth 3 (three) times daily as needed (pain).       Allergies  Allergen Reactions  . Toradol [Ketorolac Tromethamine] Anaphylaxis    bp dropped, diaphoresis  . Other     Allergic reaction to steroid? Unsure of name  . Adhesive [Tape] Rash and Other (See Comments)    "  blisters"   Follow-up Information    Follow up with [name], MD. Schedule an appointment as soon as possible for a visit in 1 week.   Specialty:  Internal Medicine   Why:  Hospital follow up   Contact information:   [place] [address] [phone]       Follow up with [name] III, MD In 2 weeks.   Specialty:  Cardiology   Why:  Hospital follow up   Contact information:   [id number] [place] [address] [phone]       Follow up with [name], MD. Schedule an appointment as soon as possible for a visit in 1 week.   Specialty:  Neurosurgery   Why:  Hospital follow up, back pain/leg weakness   Contact information:   [address] [place] [address] [phone]        The results of significant diagnostics from this hospitalization (including imaging, microbiology, ancillary and laboratory) are listed below for reference.    Significant Diagnostic Studies: Dg Chest 2 View  08/24/2015  CLINICAL DATA:  60 year old male with central chest pain. EXAM: CHEST  2 VIEW COMPARISON:  Chest radiograph dated 08/05/2015 FINDINGS: Two views of the chest demonstrate  bibasilar atelectatic changes. There is no focal consolidation, pleural effusion, or pneumothorax. The cardiac silhouette is within normal limits. No acute osseous pathology identified. A spinal stimulator is noted over the mid thoracic spine. IMPRESSION: No active cardiopulmonary disease. Electronically Signed   By: Anner Crete M.D.   On: 08/24/2015 18:21   Ct Head Wo Contrast  08/24/2015  CLINICAL DATA:  60 year old male with altered mental status. EXAM: CT HEAD WITHOUT CONTRAST CT CERVICAL SPINE WITHOUT CONTRAST TECHNIQUE: Multidetector CT imaging of the head and cervical spine was performed following the standard protocol without intravenous contrast. Multiplanar CT image reconstructions of the cervical spine were also generated. COMPARISON:  Head CT dated 12/01/2011 FINDINGS: CT HEAD FINDINGS The ventricles and sulci are appropriate in size for the patient's age. There is no intracranial hemorrhage. No mass effect or midline shift identified. The gray-white matter differentiation is preserved. There is no extra-axial fluid collection. Mild mucoperiosteal thickening of paranasal sinuses. No air-fluid levels. The mastoid air cells are clear. The calvarium is intact. CT CERVICAL SPINE FINDINGS There is no acute fracture or subluxation of the cervical spine.There is multilevel degenerative changes most prominent at C5-C6.The odontoid and spinous processes are intact.There is normal anatomic alignment of the C1-C2 lateral masses. The visualized soft tissues appear unremarkable. IMPRESSION: No acute intracranial pathology. No acute/traumatic cervical spine pathology. Electronically Signed   By: Anner Crete M.D.   On: 08/24/2015 22:27   Ct Cervical Spine Wo Contrast  08/24/2015  CLINICAL DATA:  60 year old male with altered mental status. EXAM: CT HEAD WITHOUT CONTRAST CT CERVICAL SPINE WITHOUT CONTRAST TECHNIQUE: Multidetector CT imaging of the head and cervical spine was performed following the  standard protocol without intravenous contrast. Multiplanar CT image reconstructions of the cervical spine were also generated. COMPARISON:  Head CT dated 12/01/2011 FINDINGS: CT HEAD FINDINGS The ventricles and sulci are appropriate in size for the patient's age. There is no intracranial hemorrhage. No mass effect or midline shift identified. The gray-white matter differentiation is preserved. There is no extra-axial fluid collection. Mild mucoperiosteal thickening of paranasal sinuses. No air-fluid levels. The mastoid air cells are clear. The calvarium is intact. CT CERVICAL SPINE FINDINGS There is no acute  fracture or subluxation of the cervical spine.There is multilevel degenerative changes most prominent at C5-C6.The odontoid and spinous processes are intact.There is normal anatomic alignment of the C1-C2 lateral masses. The visualized soft tissues appear unremarkable. IMPRESSION: No acute intracranial pathology. No acute/traumatic cervical spine pathology. Electronically Signed   By: Anner Crete M.D.   On: 08/24/2015 22:27   Dg Chest Portable 1 View  08/05/2015  CLINICAL DATA:  7 p.m. today, onset right sternal chest pain and diaphoresis. EXAM: PORTABLE CHEST 1 VIEW COMPARISON:  07/15/2015 FINDINGS: Shallow inspiration. Heart size and pulmonary vascularity are normal. No focal airspace disease or consolidation. No blunting of costophrenic angles. No pneumothorax. Mediastinal contours appear intact. Stimulator leads projected over the mid thoracic spine. IMPRESSION: No active disease. Electronically Signed   By: Lucienne Capers M.D.   On: 08/05/2015 22:52    Microbiology: No results found for this or any previous visit (from the past 240 hour(s)).   Labs: Basic Metabolic Panel:  Recent Labs Lab 08/24/15 1800 08/25/15 1110 08/26/15 0234 08/26/15 1438  NA 141  --  141  --   K 3.1*  --  3.1* 3.7  CL 109  --  109  --   CO2 25  --  23  --   GLUCOSE 99  --  166*  --   BUN 15  --  11  --     CREATININE 1.14  --  1.00  --   CALCIUM 9.3  --  9.2  --   MG  --  1.8  --   --    Liver Function Tests:  Recent Labs Lab 08/24/15 1800  AST 20  ALT 24  ALKPHOS 68  BILITOT 0.6  PROT 6.6  ALBUMIN 3.8   No results for input(s): LIPASE, AMYLASE in the last 168 hours. No results for input(s): AMMONIA in the last 168 hours. CBC:  Recent Labs Lab 08/24/15 1800  WBC 7.5  NEUTROABS 4.0  HGB 12.2*  HCT 37.4*  MCV 81.8  PLT 259   Cardiac Enzymes:  Recent Labs Lab 08/25/15 0106 08/25/15 0523 08/25/15 1110  TROPONINI 0.17* 0.19* 0.16*   BNP: BNP (last 3 results) No results for input(s): BNP in the last 8760 hours.  ProBNP (last 3 results) No results for input(s): PROBNP in the last 8760 hours.  CBG:  Recent Labs Lab 08/24/15 1730  GLUCAP 117*       Signed:  Cristal Ford  [facility] 08/26/2015, 4:01 PM

## 2015-08-27 ENCOUNTER — Telehealth: Payer: Self-pay

## 2015-08-27 NOTE — Telephone Encounter (Signed)
Patient seen in the hospital Friday 08/24/2015 at 4:30 blood drawn and it was stated that his potassium was very low. He was taking lisinopril 20mg  [name] took him down to 5mg . Potassium 20mg  then he was taken down to 10mg . Wife wants to know what he needs to be taking. His BP was high at the hospital d Potassium was low.

## 2015-08-27 NOTE — Telephone Encounter (Signed)
Called pt after receiving messa

## 2015-08-27 NOTE — Telephone Encounter (Signed)
Agree. Richardson Dopp, PA-C   08/27/2015 4:52 PM

## 2015-08-27 NOTE — Telephone Encounter (Signed)
Called pt after receiving a message from our refill dept that the pt's wife has questions about his medications. No DPR o file pt gave verbal permission for me to speak with his wife. Pt wife sts that she is confused on what should be pt's Lisinopril and Potassium dosage. Adv Mrs.Lax that she should follow the pt most recent d/c summary from [facility]. Pt should be taking Potassium 58meq po qd, and Lisinopril 5mg  po qd. Mrs.Banner sts that they prefer [name] (pcp) manages the pt's anti-hypertensive medication. Pt sees [name] monthly. Pt has f/u appt with our office and his pcp on 09/07/15. Pt has already been adv by his pcp to monitor his bp twice daily and keep a bp log. Adv that pt continue his medications per d/c, any future medication adjustments can be discussed at pt's upcoming appt. Pt and pt wife agreeable with plan and verbalized understanding.

## 2015-08-27 NOTE — Telephone Encounter (Signed)
Called pt after receiving a message from our refill dept that the pt's wife has questions about his medications. No DPR on file pt gave verbal permission for me to speak with his wife. Pt is doing ok, no complaints. Pt wife sts that she is confused on what should be pt's Lisinopril and Potassium dosage. Adv Mrs.Thomas Mcclure that she should follow the pt's most recent d/c summary from [facility]. Pt should be taking Potassium 2meq po qd, and Lisinopril 5mg  po qd. Mrs.Thomas Mcclure sts that they prefer [name] (pcp) manages the pt's anti-hypertensive medication. Pt sees [name] monthly. Pt has f/u appt with our office and his pcp on 09/07/15. Pt has already been adv by his pcp to monitor his bp twice daily and keep a bp log. Adv that pt continue his medications per discharge, any future medication adjustments can be discussed at pt's upcoming appt. Pt and pt wife agreeable with plan and verbalized understanding.

## 2015-09-04 ENCOUNTER — Other Ambulatory Visit: Payer: Self-pay | Admitting: Internal Medicine

## 2015-09-06 ENCOUNTER — Encounter: Payer: Self-pay | Admitting: Physician Assistant

## 2015-09-06 DIAGNOSIS — I255 Ischemic cardiomyopathy: Secondary | ICD-10-CM | POA: Insufficient documentation

## 2015-09-06 NOTE — Progress Notes (Signed)
Cardiology Office Note:    Date:  09/07/2015   ID:  Thomas Mcclure, DOB Feb 17, 1956, MRN [id number]  PCP:  Thomas Blocker, MD  Cardiologist:  Dr. Daneen Mcclure   Electrophysiologist:  n/a  Referring MD: Thomas Blocker, MD   Chief Complaint  Patient presents with  . Hospitalization Follow-up    admx with chest pain    History of Present Illness:     Thomas Mcclure is a 60 y.o. male with a hx of diabetes, HTN, HL, chronic back pain.  Admitted in 5/17 with a non-STEMI. LHC demonstrated an occluded proximal RCA which was treated with aspiration thrombectomy and DES. Echocardiogram demonstrated mild LVH with normal LV function and normal wall motion, moderate RVE and hypocontractility of the RV apex. Seen in FU 08/02/15.    Patient was readmitted 6/11-6/14 with symptoms of unstable angina. He had minimally elevated troponin levels without clear trend. Cardiac catheterization demonstrated patent RCA stent and no significant CAD elsewhere. There was an OM1 50% stenosis. 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  He was readmitted 6/30-7/2 with chief complaint of generalized fatigue. Follow-up echocardiogram demonstrated EF 45-50% with inferior hypokinesis and mild diastolic dysfunction. RVSF was moderately reduced. Patient did have mildly elevated troponin levels without clear trend. He was not followed by cardiology. He was evaluated by physical therapy and home health PT was recommended. Workup in the hospital was essentially unremarkable and symptoms were felt to be related to deconditioning.   He returns for follow-up. Here today with his wife. He notes significant issues with poor sleep, poor appetite and continued fatigue. He notes occasional crying spells. He is working with home health PT. He still has occasional chest pains. He also has occasional dyspnea. He denies significant dyspnea with exertion. Sleeps on 2 pillows. Denies PND. Denies pedal  edema. Denies syncope.  Past Medical History  Diagnosis Date  . Arthritis     Rheumatoid  . GERD (gastroesophageal reflux disease)   . Hyperlipidemia   . Hypertension     Dr. Antonietta Mcclure [phone]  . Chronic back pain   . 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 Diabetes mellitus     diet controlled  . Cyst (solitary) of breast     ? cyst left calf ,knee  . Baker's cyst     Left calf  . CAD in native artery, 07/15/15 PCI of RCA with DES 07/16/2015    a.   NSTEMI 5/17: LHC - pLAD 20, pLCx 20, OM1 30, pRCA 100, EF 45-50%>> PCI: 2.5 x 24 mm Promus DES to RCA  //  b.   Echo 5/17: mild LVH, EF 50-55%, no RWMA, mod RVE //  c. LHC 6/17: pLAD 20, pLCx 20, OM1 50, pRCA stent ok, EF 35-45% with mod sized inf wall and basal segment aneurysm  . Myocardial infarction ([place]) 2017  . Ischemic cardiomyopathy     a. LV-gram at time of LHC in 6/17 with EF 35-45%  //  b. Echo 7/17: EF 45-50%, inferior HK, grade 1 diastolic dysfunction, mildly dilated aortic root, moderately reduced RVSF, mild RAE    Past Surgical History  Procedure Laterality Date  . Lumbar spine surgery  03/2009  & 2012  . Wisdom tooth extraction      hx of  . Mass excision N/A 04/19/2012    Procedure: removal of posterior cervical lipoma;  Surgeon: Thomas Charter, MD;  Location: [facility];  Service: Neurosurgery;  Laterality: N/A;  Removal of posterior cervical lipoma  . Spinal cord stimulator insertion N/A 01/07/2013    Procedure:  SPINAL CORD STIMULATOR INSERTION;  Surgeon: Thomas Gains, MD;  Location: [facility];  Service: Neurosurgery;  Laterality: N/A;  . Cardiac catheterization N/A 07/15/2015    Procedure: Left Heart Cath and Coronary Angiography;  Surgeon: Thomas M Martinique, MD;  Location: [facility];  Service: Cardiovascular;  Laterality: N/A;  . Cardiac catheterization N/A 07/15/2015    Procedure: Coronary Stent Intervention;  Surgeon: Thomas M Martinique, MD;  Location: [facility];   Service: Cardiovascular;  Laterality: N/A;  . Knee surgery    . Coronary stent placement  07/2015  . Cardiac catheterization N/A 08/07/2015    Procedure: Left Heart Cath and Coronary Angiography;  Surgeon: Thomas Crome, MD;  Location: [facility];  Service: Cardiovascular;  Laterality: N/A;    Current Medications: Outpatient Prescriptions Prior to Visit  Medication Sig Dispense Refill  . atorvastatin (LIPITOR) 80 MG tablet Take 1 tablet (80 mg total) by mouth daily at 6 PM. 30 tablet 1  . cyclobenzaprine (FLEXERIL) 10 MG tablet Take 10 mg by mouth 3 (three) times daily as needed for muscle spasms.     . diazepam (VALIUM) 10 MG tablet Take 10 mg by mouth 2 (two) times daily. For spasms    . DULoxetine (CYMBALTA) 60 MG capsule Take 60 mg by mouth daily.    . feeding supplement, ENSURE ENLIVE, (ENSURE ENLIVE) LIQD Take 237 mLs by mouth 2 (two) times daily between meals. 237 mL 12  . furosemide (LASIX) 40 MG tablet Take 1 tablet (40 mg total) by mouth daily. 30 tablet 6  . meloxicam (MOBIC) 15 MG tablet Take 15 mg by mouth daily.    . metFORMIN (GLUCOPHAGE-XR) 750 MG 24 hr tablet Take 750 mg by mouth daily with supper.    . nitroGLYCERIN (NITROSTAT) 0.4 MG SL tablet Place 1 tablet (0.4 mg total) under the tongue every 5 (five) minutes as needed for chest pain. 25 tablet 6  . omeprazole (PRILOSEC) 40 MG capsule Take 40 mg by mouth daily.    . Oxycodone HCl 10 MG TABS Take 1 tablet (10 mg total) by mouth every 4 (four) hours. (Patient taking differently: Take 10 mg by mouth every 6 (six) hours. ) 60 tablet 0  . polyethylene glycol powder (GLYCOLAX/MIRALAX) powder Take 17 g by mouth daily as needed (constipation). Mix in 8 oz water, juice, soda, coffee or tea and drink    . pregabalin (LYRICA) 100 MG capsule Take 100 mg by mouth 3 (three) times daily.     . ticagrelor (BRILINTA) 90 MG TABS tablet Take 1 tablet (90 mg total) by mouth 2 (two) times daily. 60 tablet 1  . topiramate (TOPAMAX) 50  MG tablet Take 50 mg by mouth 2 (two) times daily as needed (nerve pain). For nerve pain    . traMADol (ULTRAM) 50 MG tablet Take 100 mg by mouth 3 (three) times daily as needed (pain).     . metoprolol tartrate (LOPRESSOR) 25 MG tablet Take 0.5 tablets (12.5 mg total) by mouth 2 (two) times daily. 60 tablet 1  . Potassium Chloride ER 20 MEQ TBCR Take 10 mEq by mouth once. 30 tablet 0  . aspirin EC 81 MG tablet Take 1 tablet (81 mg total) by mouth daily. 30 tablet 1  . lisinopril (PRINIVIL,ZESTRIL) 5 MG tablet  Take 1 tablet (5 mg total) by mouth daily. (Patient not taking: Reported on 09/07/2015) 90 tablet 3   No facility-administered medications prior to visit.      Allergies:   Toradol; Other; and Adhesive   Social History   Social History  . Marital Status: Married    Spouse Name: N/A  . Number of Children: 5  . Years of Education: N/A   Occupational History  . 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 Smoking status: Former Smoker -- 15 years    Types: Cigarettes  . Smokeless tobacco: Former Systems developer    Quit date: 04/08/2015     Comment: 5 cig a week  . Alcohol Use: 0.0 oz/week    0 Standard drinks or equivalent per week     Comment: Couple 40 ounces daily until 2012  . Drug Use: No  . Sexual Activity: Not on file   Other Topics Concern  . Not on file   Social History Narrative     Family History:  The patient's family history includes Heart disease in his mother; Prostate cancer in his brother.   ROS:   Please see the history of present illness.    Review of Systems  Constitution: Positive for decreased appetite, malaise/fatigue and weight loss.  Cardiovascular: Positive for chest pain and dyspnea on exertion.  Respiratory: Positive for cough and shortness of breath.   Musculoskeletal: Positive for back pain, joint pain and myalgias.  Neurological: Positive for dizziness and loss of balance.    Psychiatric/Behavioral: Positive for depression. The patient is nervous/anxious.    All other systems reviewed and are negative.   Physical Exam:    VS:  BP 130/86 mmHg  Pulse 88  Ht 5\' 9"  (1.753 m)  Wt 216 lb 12.8 oz (98.34 kg)  BMI 32.00 kg/m2   Physical Exam  Constitutional: He is oriented to person, place, and time. He appears well-developed and well-nourished. No distress.  HENT:  Head: Normocephalic and atraumatic.  Neck: No JVD present.  Cardiovascular: Normal rate, regular rhythm and normal heart sounds.   No murmur heard. Pulmonary/Chest: Effort normal and breath sounds normal. He has no wheezes. He has no rales.  Abdominal: Soft. There is no tenderness.  Musculoskeletal: He exhibits no edema.  right wrist without hematoma or mass   Neurological: He is alert and oriented to person, place, and time.  Skin: Skin is warm and dry.  Psychiatric: His affect is blunt.  Tearful; crying at times    Wt Readings from Last 3 Encounters:  09/07/15 216 lb 12.8 oz (98.34 kg)  08/25/15 219 lb 2.2 oz (99.4 kg)  08/08/15 182 lb 9.6 oz (82.827 kg)     Studies/Labs Reviewed:     EKG:  EKG is  ordered today.  The ekg ordered today demonstrates NSR, HR 88, inferior Q waves, T-wave inversions in 2, 3, aVF, V3-the 5, QTc 464 ms, PVC, no change since prior tracing  Recent Labs: 08/24/2015: ALT 24; Hemoglobin 12.2*; Platelets 259 08/25/2015: Magnesium 1.8; TSH 0.388 08/26/2015: BUN 11; Creatinine, Ser 1.00; Potassium 3.7; Sodium 141   Recent Lipid Panel    Component Value Date/Time   CHOL 149 07/15/2015 0717   TRIG 82 07/15/2015 0717   HDL 37* 07/15/2015 0717   CHOLHDL 4.0 07/15/2015 0717   VLDL 16 07/15/2015 0717   LDLCALC 96 07/15/2015 0717    Additional studies/ records that were reviewed today include:  Echo 08/25/15 - Left ventricle: The cavity size was mildly dilated. Wall   thickness was normal. Systolic function was mildly reduced. The   estimated ejection fraction was  in the range of 45% to 50%. There   is hypokinesis of the basalinferior myocardium. Doppler   parameters are consistent with abnormal left ventricular   relaxation (grade 1 diastolic dysfunction). - Aortic root: The aortic root was mildly dilated. - Right ventricle: The cavity size was mildly dilated. Systolic   function was moderately reduced. - Right atrium: The atrium was mildly dilated. Impressions:   Limited study to assess LV function. Hypokinesis of the basal inferior wall with overall mildly reduced LV systolic function; grade 1 diastolic dysfunction; RAE/RVE with reduced RV function.  Compared to 07/15/15, LV function may be slightly worse.  LHC 08/07/15 LAD proximal 20% LCx proximal 20% OM1 50% RCA proximal stent patent EF 35-45%  Widely patent right coronary artery including recently placed proximal stent at the time of acute coronary intervention.  Irregs noted in the prox and mid LAD as well as LCx. There is up to 50% stenosis in the prox segment of large OM.  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 Overall LVEF of 35-40%. LVEDP 23 mmHg.  No obvious explanation for the patient's ongoing chest pain complaints.  Echo 07/15/15 - Left ventricle: The cavity size was normal. Wall thickness was  increased in a pattern of mild LVH. Systolic function was normal.  The estimated ejection fraction was in the range of 50% to 55%.  Wall motion was normal; there were no regional wall motion  abnormalities. The study is not technically sufficient to allow  evaluation of LV diastolic function. - Aortic valve: Valve area (VTI): 4.03 cm^2. Valve area (Vmax):  3.85 cm^2. - Right ventricle: The cavity size was moderately dilated. The RV  free wall is hypokinetic, with hypercontractility of the RV apex.  Findings consistent with McConnell&'s sign suggestiive of  pulmonary embolism. From literature review this sign can also be  present in RCA infarcts. Systolic  function was moderately to  severely reduced. RV TAPSE is 1.0 cm.  LHC 07/15/15 LAD proximal 20% LCx proximal 20%, OM1 30% RCA proximal 100% EF 45-50% with inferior wall motion abnormalities PCI: STENT PROMUS PREM MR 3.5X24 To the RCA 1. Single vessel occlusive CAD. 2. Mild LV dysfunction 3. Successful PCI of the RCA with aspiration thrombectomy and stenting of the proximal RCA with DES. Plan: DAPT for one year. Continue Aggrastat for 18 hours.  ASSESSMENT:     1. Coronary artery disease involving native coronary artery of native heart without angina pectoris   2. Elevated troponin   3. Chronic combined systolic and diastolic CHF (congestive heart failure) ([place])   4. Ischemic cardiomyopathy   5. Essential hypertension   6. Hyperlipidemia   7. Depression     PLAN:     In order of problems listed above:  1. CAD - He is status post non-STEMI in 5/17 treated with DES to the RCA. Repeat LHC in 6/17 demonstrated patent RCA stent, mild plaque in the LAD and LCx in moderate nonobstructive disease in the OM1. EF was 35-45% with inferior wall aneurysm. Follow-up echo with inferior hypokinesis and EF 45-50%. He was last admitted with generalized fatigue felt to be related to deconditioning. He was sent home with home health PT. He is overall stable. I reassured him today regarding the findings on his recent cardiac catheterization. He does have occasional dyspnea that sounds  like a side effect to Brilinta.  -  Continue aspirin, Brilinta, statin, beta Mcclure, ACE inhibitor.  -  If dyspnea continues, consider changing Brilinta to Plavix  2. Elevated troponin - He appears to have chronically elevated Troponin levels. His MI was marked by significantly elevated Troponin levels.  Since then, he has had mildly elevated levels without clear trend.  3. Combined systolic and diastolic CHF - NYHA [id number]. Continue current therapy. Check repeat BMET today.  4. Ischemic CM - EF 45-50% by most recent  echocardiogram. Continue beta Mcclure, ACE inhibitor.   5. HTN - Blood pressure with fair control. Given CHF and cardiomyopathy, would like to see his heart rate less than 70. Increase metoprolol tartrate 25 mg twice a day.  6. HL - Continue statin.  7. Depression - FU with PCP today to discuss.  I think that a lot of his symptoms are related to depression which is uncontrolled.   Medication Adjustments/Labs and Tests Ordered: Current medicines are reviewed at length with the patient today.  Concerns regarding medicines are outlined above.  Medication changes, Labs and Tests ordered today are outlined in the Patient Instructions noted below. Patient Instructions  Medication Instructions:  1. INCREASE METOPROLOL TO 25 MG TWICE DAILY Labwork: 1. TODAY BMET Testing/Procedures: NONE Follow-Up: KEEP YOUR APPT 10/04/2015 WITH Lessie Manigo, PAC  Any Other Special Instructions Will Be Listed Below (If Applicable). If you need a refill on your cardiac medications before your next appointment, please call your pharmacy.   Signed, Richardson Dopp, PA-C  09/07/2015 9:05 AM    [facility] [address], [place], [place]  [address] Phone: [phone]; Fax: [phone]

## 2015-09-07 ENCOUNTER — Ambulatory Visit (INDEPENDENT_AMBULATORY_CARE_PROVIDER_SITE_OTHER): Payer: Medicare Other | Admitting: Physician Assistant

## 2015-09-07 ENCOUNTER — Encounter: Payer: Self-pay | Admitting: Physician Assistant

## 2015-09-07 ENCOUNTER — Telehealth: Payer: Self-pay | Admitting: *Deleted

## 2015-09-07 ENCOUNTER — Encounter (INDEPENDENT_AMBULATORY_CARE_PROVIDER_SITE_OTHER): Payer: Self-pay

## 2015-09-07 VITALS — BP 130/86 | HR 88 | Ht 69.0 in | Wt 216.8 lb

## 2015-09-07 DIAGNOSIS — I5042 Chronic combined systolic (congestive) and diastolic (congestive) heart failure: Secondary | ICD-10-CM | POA: Diagnosis not present

## 2015-09-07 DIAGNOSIS — E785 Hyperlipidemia, unspecified: Secondary | ICD-10-CM

## 2015-09-07 DIAGNOSIS — I255 Ischemic cardiomyopathy: Secondary | ICD-10-CM | POA: Diagnosis not present

## 2015-09-07 DIAGNOSIS — R7989 Other specified abnormal findings of blood chemistry: Secondary | ICD-10-CM

## 2015-09-07 DIAGNOSIS — I2510 Atherosclerotic heart disease of native coronary artery without angina pectoris: Secondary | ICD-10-CM | POA: Diagnosis not present

## 2015-09-07 DIAGNOSIS — F329 Major depressive disorder, single episode, unspecified: Secondary | ICD-10-CM

## 2015-09-07 DIAGNOSIS — R778 Other specified abnormalities of plasma proteins: Secondary | ICD-10-CM

## 2015-09-07 DIAGNOSIS — F32A Depression, unspecified: Secondary | ICD-10-CM

## 2015-09-07 DIAGNOSIS — I10 Essential (primary) hypertension: Secondary | ICD-10-CM

## 2015-09-07 LAB — BASIC METABOLIC PANEL
BUN: 10 mg/dL (ref 7–25)
CO2: 24 mmol/L (ref 20–31)
Calcium: 9.9 mg/dL (ref 8.6–10.3)
Chloride: 103 mmol/L (ref 98–110)
Creat: 1.13 mg/dL (ref 0.70–1.25)
Glucose, Bld: 224 mg/dL — ABNORMAL HIGH (ref 65–99)
Potassium: 3.8 mmol/L (ref 3.5–5.3)
Sodium: 139 mmol/L (ref 135–146)

## 2015-09-07 MED ORDER — POTASSIUM CHLORIDE ER 20 MEQ PO TBCR
20.0000 meq | EXTENDED_RELEASE_TABLET | Freq: Once | ORAL | Status: DC
Start: 2015-09-07 — End: 2015-10-04

## 2015-09-07 MED ORDER — METOPROLOL TARTRATE 25 MG PO TABS: 25.0000 mg | ORAL_TABLET | Freq: Two times a day (BID) | ORAL | Status: DC

## 2015-09-07 NOTE — Patient Instructions (Addendum)
Medication Instructions:  1. INCREASE METOPROLOL TO 25 MG TWICE DAILY Labwork: 1. TODAY BMET Testing/Procedures: NONE Follow-Up: KEEP YOUR APPT 10/04/2015 WITH SCOTT WEAVER, PAC  Any Other Special Instructions Will Be Listed Below (If Applicable). If you need a refill on your cardiac medications before your next appointment, please call your pharmacy.

## 2015-09-07 NOTE — Telephone Encounter (Signed)
DPR for pt's wife who has been notified of lab results by phone with verbal understanding. Advised pt to f/u w/PCP about DM, wife states just [name] earlier today. I will fax results to [name]. Wife asked for me to please put a message to [name] about pt's glucose 224 and to please have Dr. Randel Pigg office call the pt on Monday for further advice about DM.

## 2015-09-12 ENCOUNTER — Emergency Department (HOSPITAL_COMMUNITY)
Admission: EM | Admit: 2015-09-12 | Discharge: 2015-09-12 | Disposition: A | Discharge status: Home / Self Care | Payer: Medicare Other | Attending: Emergency Medicine | Admitting: Emergency Medicine

## 2015-09-12 ENCOUNTER — Telehealth: Payer: Self-pay | Admitting: Interventional Cardiology

## 2015-09-12 ENCOUNTER — Emergency Department (HOSPITAL_COMMUNITY): Payer: Medicare Other

## 2015-09-12 ENCOUNTER — Encounter (HOSPITAL_COMMUNITY): Payer: Self-pay

## 2015-09-12 DIAGNOSIS — I252 Old myocardial infarction: Secondary | ICD-10-CM | POA: Diagnosis not present

## 2015-09-12 DIAGNOSIS — M25562 Pain in left knee: Secondary | ICD-10-CM | POA: Insufficient documentation

## 2015-09-12 DIAGNOSIS — M25512 Pain in left shoulder: Secondary | ICD-10-CM | POA: Insufficient documentation

## 2015-09-12 DIAGNOSIS — Z79899 Other long term (current) drug therapy: Secondary | ICD-10-CM | POA: Diagnosis not present

## 2015-09-12 DIAGNOSIS — I10 Essential (primary) hypertension: Secondary | ICD-10-CM

## 2015-09-12 DIAGNOSIS — R079 Chest pain, unspecified: Secondary | ICD-10-CM | POA: Insufficient documentation

## 2015-09-12 DIAGNOSIS — Z87891 Personal history of nicotine dependence: Secondary | ICD-10-CM | POA: Insufficient documentation

## 2015-09-12 DIAGNOSIS — Z7982 Long term (current) use of aspirin: Secondary | ICD-10-CM | POA: Insufficient documentation

## 2015-09-12 DIAGNOSIS — Z7984 Long term (current) use of oral hypoglycemic drugs: Secondary | ICD-10-CM | POA: Insufficient documentation

## 2015-09-12 DIAGNOSIS — M549 Dorsalgia, unspecified: Secondary | ICD-10-CM

## 2015-09-12 DIAGNOSIS — E119 Type 2 diabetes mellitus without complications: Secondary | ICD-10-CM | POA: Diagnosis not present

## 2015-09-12 DIAGNOSIS — R0789 Other chest pain: Secondary | ICD-10-CM

## 2015-09-12 DIAGNOSIS — G8929 Other chronic pain: Secondary | ICD-10-CM

## 2015-09-12 DIAGNOSIS — M2550 Pain in unspecified joint: Secondary | ICD-10-CM

## 2015-09-12 LAB — BASIC METABOLIC PANEL
Anion gap: 8 (ref 5–15)
BUN: 18 mg/dL (ref 6–20)
CO2: 20 mmol/L — ABNORMAL LOW (ref 22–32)
Calcium: 9.3 mg/dL (ref 8.9–10.3)
Chloride: 108 mmol/L (ref 101–111)
Creatinine, Ser: 0.99 mg/dL (ref 0.61–1.24)
GFR calc Af Amer: >60 mL/min (ref >60)
GFR calc non Af Amer: >60 mL/min (ref >60)
Glucose, Bld: 99 mg/dL (ref 65–99)
Potassium: 3.7 mmol/L (ref 3.5–5.1)
Sodium: 136 mmol/L (ref 135–145)

## 2015-09-12 LAB — URIC ACID: Uric Acid, Serum: 8.2 mg/dL — ABNORMAL HIGH (ref 4.4–7.6)

## 2015-09-12 LAB — I-STAT TROPONIN, ED: Troponin i, poc: 0.24 ng/mL (ref 0.00–0.08)

## 2015-09-12 LAB — CBC
HCT: 39.3 % (ref 39.0–52.0)
Hemoglobin: 13 g/dL (ref 13.0–17.0)
MCH: 26.9 pg (ref 26.0–34.0)
MCHC: 33.1 g/dL (ref 30.0–36.0)
MCV: 81.4 fL (ref 78.0–100.0)
Platelets: 220 10*3/uL (ref 150–400)
RBC: 4.83 MIL/uL (ref 4.22–5.81)
RDW: 16.7 % — ABNORMAL HIGH (ref 11.5–15.5)
WBC: 15.3 10*3/uL — ABNORMAL HIGH (ref 4.0–10.5)

## 2015-09-12 MED ORDER — ONDANSETRON HCL 4 MG/2ML IJ SOLN
4.0000 mg | Freq: Once | INTRAMUSCULAR | Status: AC
  Administered 2015-09-12: 4 mg via INTRAVENOUS
  Filled 2015-09-12: qty 2

## 2015-09-12 MED ORDER — HYDROMORPHONE HCL 1 MG/ML IJ SOLN
1.0000 mg | Freq: Once | INTRAMUSCULAR | Status: AC
  Administered 2015-09-12: 1 mg via INTRAVENOUS
  Filled 2015-09-12: qty 1

## 2015-09-12 MED ORDER — NITROGLYCERIN 2 % TD OINT
1.0000 [in_us] | TOPICAL_OINTMENT | Freq: Once | TRANSDERMAL | Status: AC
  Administered 2015-09-12: 1 [in_us] via TOPICAL
  Filled 2015-09-12: qty 1

## 2015-09-12 MED ORDER — PREDNISONE 20 MG PO TABS: ORAL_TABLET | ORAL | Status: DC

## 2015-09-12 NOTE — Telephone Encounter (Signed)
New message     The physical therapist was calling concerning the pt over the last few days has had weakness low grade fever, and has been taking Nitro, the physical therapist seems to think the pt should come to see their cardiologist

## 2015-09-12 NOTE — ED Provider Notes (Signed)
CSN: [id number]     Arrival date & time 09/12/15  1430 History   First MD Initiated Contact with Patient 09/12/15 1506     Chief Complaint  Patient presents with  . Chest Pain     (Consider location/radiation/quality/duration/timing/severity/associated sxs/prior Treatment) Patient is a 60 y.o. male presenting with chest pain. The history is provided by the patient (Patient complains of chest pain left shoulder pain and left knee pain).  Chest Pain Pain location:  L chest Pain quality: aching   Pain radiates to:  Does not radiate Pain radiates to the back: no   Pain severity:  Moderate Onset quality:  Sudden Timing:  Intermittent Progression:  Waxing and waning Chronicity:  New Context: not breathing   Associated symptoms: no abdominal pain, no back pain, no cough, no fatigue and no headache     Past Medical History  Diagnosis Date  . Arthritis     Rheumatoid  . GERD (gastroesophageal reflux disease)   . Hyperlipidemia   . Hypertension     Dr. [name] [phone]  . Chronic back pain   . 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 Diabetes mellitus     diet controlled  . Cyst (solitary) of breast     ? cyst left calf ,knee  . Baker's cyst     Left calf  . CAD in native artery, 07/15/15 PCI of RCA with DES 07/16/2015    a.   NSTEMI 5/17: LHC - pLAD 20, pLCx 20, OM1 30, pRCA 100, EF 45-50%>> PCI: 2.5 x 24 mm Promus DES to RCA  //  b.   Echo 5/17: mild LVH, EF 50-55%, no RWMA, mod RVE //  c. LHC 6/17: pLAD 20, pLCx 20, OM1 50, pRCA stent ok, EF 35-45% with mod sized inf wall and basal segment aneurysm  . Myocardial infarction ([place]) 2017  . Ischemic cardiomyopathy     a. LV-gram at time of LHC in 6/17 with EF 35-45%  //  b. Echo 7/17: EF 45-50%, inferior HK, grade 1 diastolic dysfunction, mildly dilated aortic root, moderately reduced RVSF, mild RAE   Past Surgical History  Procedure Laterality Date  . Lumbar spine surgery  03/2009  & 2012  . Wisdom tooth  extraction      hx of  . Mass excision N/A 04/19/2012    Procedure: removal of posterior cervical lipoma;  Surgeon: [name], MD;  Location: [facility];  Service: Neurosurgery;  Laterality: N/A;  Removal of posterior cervical lipoma  . Spinal cord stimulator insertion N/A 01/07/2013    Procedure:  SPINAL CORD STIMULATOR INSERTION;  Surgeon: [name], MD;  Location: [facility];  Service: Neurosurgery;  Laterality: N/A;  . Cardiac catheterization N/A 07/15/2015    Procedure: Left Heart Cath and Coronary Angiography;  Surgeon: [name], MD;  Location: [facility];  Service: Cardiovascular;  Laterality: N/A;  . Cardiac catheterization N/A 07/15/2015    Procedure: Coronary Stent Intervention;  Surgeon: [name], MD;  Location: [facility];  Service: Cardiovascular;  Laterality: N/A;  . Knee surgery    . Coronary stent placement  07/2015  . Cardiac catheterization N/A 08/07/2015    Procedure: Left Heart Cath and Coronary Angiography;  Surgeon: [name], MD;  Location: [facility];  Service: Cardiovascular;  Laterality: N/A;   Family History  Problem Relation Age of Onset  . Heart disease Mother   .  Prostate cancer Brother    Social History  Substance Use Topics  . Smoking status: Former Smoker -- 15 years    Types: Cigarettes  . Smokeless tobacco: Former Systems developer    Quit date: 04/08/2015     Comment: 5 cig a week  . Alcohol Use: 0.0 oz/week    0 Standard drinks or equivalent per week     Comment: Couple 40 ounces daily until 2012    Review of Systems  Constitutional: Negative for appetite change and fatigue.  HENT: Negative for congestion, ear discharge and sinus pressure.   Eyes: Negative for discharge.  Respiratory: Negative for cough.   Cardiovascular: Positive for chest pain.  Gastrointestinal: Negative for abdominal pain and diarrhea.  Genitourinary: Negative for frequency and hematuria.  Musculoskeletal: Negative for back pain.   Skin: Negative for rash.  Neurological: Negative for seizures and headaches.       Pain left shoulder left knee  Psychiatric/Behavioral: Negative for hallucinations.      Allergies  Toradol; Adhesive; and Other  Home Medications   Prior to Admission medications   Medication Sig Start Date End Date Taking? Authorizing Provider  aspirin EC 81 MG tablet Take 1 tablet (81 mg total) by mouth daily. 07/18/15  Yes Alexa Angela Burke, MD  atorvastatin (LIPITOR) 80 MG tablet Take 1 tablet (80 mg total) by mouth daily at 6 PM. 07/18/15  Yes Alexa Angela Burke, MD  cyclobenzaprine (FLEXERIL) 10 MG tablet Take 10 mg by mouth 3 (three) times daily as needed for muscle spasms.    Yes Historical Provider, MD  diazepam (VALIUM) 10 MG tablet Take 10 mg by mouth 2 (two) times daily. For spasms   Yes Historical Provider, MD  DULoxetine (CYMBALTA) 60 MG capsule Take 60 mg by mouth daily.   Yes Historical Provider, MD  feeding supplement, GLUCERNA SHAKE, (GLUCERNA SHAKE) LIQD Take 237 mLs by mouth 2 (two) times daily between meals.   Yes Historical Provider, MD  furosemide (LASIX) 40 MG tablet Take 1 tablet (40 mg total) by mouth daily. 08/08/15  Yes Bhavinkumar Bhagat, PA  lisinopril (PRINIVIL,ZESTRIL) 20 MG tablet Take 20 mg by mouth at bedtime.    Yes Historical Provider, MD  metFORMIN (GLUCOPHAGE-XR) 750 MG 24 hr tablet Take 750 mg by mouth daily with supper. 06/07/15  Yes Historical Provider, MD  metoprolol tartrate (LOPRESSOR) 25 MG tablet Take 1 tablet (25 mg total) by mouth 2 (two) times daily. 09/07/15  Yes Scott Joylene Draft, PA-C  nitroGLYCERIN (NITROSTAT) 0.4 MG SL tablet Place 1 tablet (0.4 mg total) under the tongue every 5 (five) minutes as needed for chest pain. 07/18/15  Yes Scott Joylene Draft, PA-C  omeprazole (PRILOSEC) 40 MG capsule Take 40 mg by mouth daily.   Yes Historical Provider, MD  Oxycodone HCl 10 MG TABS Take 1 tablet (10 mg total) by mouth every 4 (four) hours. Patient taking differently: Take 10 mg  by mouth every 6 (six) hours.  01/07/13  Yes Clydell Hakim, MD  polyethylene glycol powder (GLYCOLAX/MIRALAX) powder Take 17 g by mouth daily as needed (constipation). Mix in 8 oz water, juice, soda, coffee or tea and drink 06/07/15  Yes Historical Provider, MD  Potassium Chloride ER 20 MEQ TBCR Take 20 mEq by mouth once. Patient taking differently: Take 20 mEq by mouth 2 (two) times daily.  09/07/15  Yes Scott T Kathlen Mody, PA-C  pregabalin (LYRICA) 100 MG capsule Take 100 mg by mouth 3 (three) times daily.    Yes Historical  Provider, MD  ticagrelor (BRILINTA) 90 MG TABS tablet Take 1 tablet (90 mg total) by mouth 2 (two) times daily. 07/18/15  Yes Alexa Angela Burke, MD  topiramate (TOPAMAX) 50 MG tablet Take 50 mg by mouth 2 (two) times daily as needed (nerve pain). For nerve pain   Yes Historical Provider, MD  traMADol (ULTRAM) 50 MG tablet Take 100 mg by mouth 2 (two) times daily.    Yes Historical Provider, MD  feeding supplement, ENSURE ENLIVE, (ENSURE ENLIVE) LIQD Take 237 mLs by mouth 2 (two) times daily between meals. 08/26/15   Maryann Mikhail, DO  predniSONE (DELTASONE) 20 MG tablet 2 tabs po daily x 3 days 09/12/15   Milton Ferguson, MD   BP 113/76 mmHg  Pulse 97  Temp(Src) 99 F (37.2 C) (Oral)  Resp 15  Ht 5\' 10"  (1.778 m)  Wt 216 lb (97.977 kg)  BMI 30.99 kg/m2  SpO2 95% Physical Exam  Constitutional: He is oriented to person, place, and time. He appears well-developed.  HENT:  Head: Normocephalic.  Eyes: Conjunctivae and EOM are normal. No scleral icterus.  Neck: Neck supple. No thyromegaly present.  Cardiovascular: Normal rate and regular rhythm.  Exam reveals no gallop and no friction rub.   No murmur heard. Pulmonary/Chest: No stridor. He has no wheezes. He has no rales. He exhibits no tenderness.  Abdominal: He exhibits no distension. There is no tenderness. There is no rebound.  Musculoskeletal: He exhibits no edema.  Tenderness left shoulder left knee. Decreased range of motion  secondary to pain  Lymphadenopathy:    He has no cervical adenopathy.  Neurological: He is oriented to person, place, and time. He exhibits normal muscle tone. Coordination normal.  Skin: No rash noted. No erythema.  Psychiatric: He has a normal mood and affect. His behavior is normal.    ED Course  Procedures (including critical care time) Labs Review Labs Reviewed  BASIC METABOLIC PANEL - Abnormal; Notable for the following:    CO2 20 (*)    All other components within normal limits  CBC - Abnormal; Notable for the following:    WBC 15.3 (*)    RDW 16.7 (*)    All other components within normal limits  URIC ACID - Abnormal; Notable for the following:    Uric Acid, Serum 8.2 (*)    All other components within normal limits  I-STAT TROPOININ, ED - Abnormal; Notable for the following:    Troponin i, poc 0.24 (*)    All other components within normal limits    Imaging Review Dg Chest 2 View  09/12/2015  CLINICAL DATA:  Left chest and proximal humerus pain that started last night. EXAM: CHEST  2 VIEW COMPARISON:  08/24/2015 FINDINGS: The lungs are clear wiithout focal pneumonia, edema, pneumothorax or pleural effusion. The cardiopericardial silhouette is within normal limits for size. The visualized bony structures of the thorax are intact. Spinal stimulator leads overlie the mid thoracic spine. IMPRESSION: Stable.  No acute findings. Electronically Signed   By: Misty Stanley M.D.   On: 09/12/2015 17:25   Dg Shoulder Left  09/12/2015  CLINICAL DATA:  Left shoulder pain starting last night. No known injury. Initial encounter. EXAM: LEFT SHOULDER - 2+ VIEW COMPARISON:  None. FINDINGS: No fracture. No evidence for shoulder separation or dislocation. Degenerative changes are noted at the acromioclavicular joint. IMPRESSION: AC joint degeneration.  No acute bony abnormality. Electronically Signed   By: Misty Stanley M.D.   On: 09/12/2015 17:26  I have personally reviewed and evaluated  these images and lab results as part of my medical decision-making.   EKG Interpretation   Date/Time:  Wednesday September 12 2015 14:33:46 EDT Ventricular Rate:  107 PR Interval:  150 QRS Duration: 98 QT Interval:  358 QTC Calculation: 477 R Axis:   -6 Text Interpretation:  Sinus tachycardia with occasional Premature  ventricular complexes Minimal voltage criteria for LVH, may be normal  variant Inferior infarct , age undetermined T wave abnormality, consider  lateral ischemia Abnormal ECG Confirmed by Denzel Etienne  MD, Maysoon Lozada [phone]) on  09/12/2015 3:12:10 PM      MDM   Final diagnoses:  Chest pain at rest  Joint pain    Patient had elevated troponin. He was seen by cardiology who felt the patient troponin was not related to a cardiac event now. The cardiologist felt patient could be discharged from a cardiac standpoint. The patient had an elevated uric acid I suspect his pain in his left shoulder and left knee may be related to gout. He will be put on a short course of prednisone he has pain medicine to take it home and he will follow-up with his PCP next week    Milton Ferguson, MD 09/12/15 1931

## 2015-09-12 NOTE — Consult Note (Signed)
CARDIOLOGY CONSULT NOTE   Patient ID: Thomas Mcclure MRN: [id number], DOB/AGE: 31-Jan-1956, Age 60   Admit date: 09/12/2015 Date of Consult: 09/12/2015   Primary Physician: [name], MD Primary Cardiologist: [name]  Pt. Profile  Mr. Thomas Mcclure is 60 yo AA male with PMH of HTN, HLD, DM II, chronic back pain on disability and CAD s/p PCI to RCA in 06/2015 presented with chest pain worse with movement  Problem List  Past Medical History  Diagnosis Date  . Arthritis     Rheumatoid  . GERD (gastroesophageal reflux disease)   . Hyperlipidemia   . Hypertension     Dr. [name] [phone]  . Chronic back pain   . 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 Diabetes mellitus     diet controlled  . Cyst (solitary) of breast     ? cyst left calf ,knee  . Baker's cyst     Left calf  . CAD in native artery, 07/15/15 PCI of RCA with DES 07/16/2015    a.   NSTEMI 5/17: LHC - pLAD 20, pLCx 20, OM1 30, pRCA 100, EF 45-50%>> PCI: 2.5 x 24 mm Promus DES to RCA  //  b.   Echo 5/17: mild LVH, EF 50-55%, no RWMA, mod RVE //  c. LHC 6/17: pLAD 20, pLCx 20, OM1 50, pRCA stent ok, EF 35-45% with mod sized inf wall and basal segment aneurysm  . Myocardial infarction ([place]) 2017  . Ischemic cardiomyopathy     a. LV-gram at time of LHC in 6/17 with EF 35-45%  //  b. Echo 7/17: EF 45-50%, inferior HK, grade 1 diastolic dysfunction, mildly dilated aortic root, moderately reduced RVSF, mild RAE    Past Surgical History  Procedure Laterality Date  . Lumbar spine surgery  03/2009  & 2012  . Wisdom tooth extraction      hx of  . Mass excision N/A 04/19/2012    Procedure: removal of posterior cervical lipoma;  Surgeon: [name], MD;  Location: [facility];  Service: Neurosurgery;  Laterality: N/A;  Removal of posterior cervical lipoma  . Spinal cord stimulator insertion N/A 01/07/2013    Procedure:  SPINAL CORD STIMULATOR INSERTION;  Surgeon: [name], MD;  Location: [facility];   Service: Neurosurgery;  Laterality: N/A;  . Cardiac catheterization N/A 07/15/2015    Procedure: Left Heart Cath and Coronary Angiography;  Surgeon: Jshon Ibe M Martinique, MD;  Location: [facility];  Service: Cardiovascular;  Laterality: N/A;  . Cardiac catheterization N/A 07/15/2015    Procedure: Coronary Stent Intervention;  Surgeon: Syann Cupples M Martinique, MD;  Location: [facility];  Service: Cardiovascular;  Laterality: N/A;  . Knee surgery    . Coronary stent placement  07/2015  . Cardiac catheterization N/A 08/07/2015    Procedure: Left Heart Cath and Coronary Angiography;  Surgeon: [name], MD;  Location: [facility];  Service: Cardiovascular;  Laterality: N/A;     Allergies  Allergies  Allergen Reactions  . Toradol [Ketorolac Tromethamine] Anaphylaxis    bp dropped, diaphoresis  . Other     Allergic reaction to steroid? Unsure of name  . Adhesive [Tape] Rash and Other (See Comments)    "blisters"    HPI   Mr. Thomas Mcclure is 60 yo AA male male with PMH of HTN, HLD, DM II, chronic back pain on disability and CAD s/p PCI to RCA in 06/2015. He has chronic  back pain and has Spinal cord stimulator placed by Dr. Maryjean Ka on 01/07/2013. He has been followed by his PCP who manage his pain medication. He originally came in on 07/15/2015 with non-ST elevated MI, his troponin went up to 37.38. Cardiac catheterization performed on 5/21 showed single-vessel occlusive CAD with 100% proximal RCA lesion treated with drug-eluting stent, 20% proximal to mid LAD lesion, 20% left circumflex lesion, 30% OM1, EF 45-50%. Echocardiogram obtained on the same day showed EF 50-55%, mild LVH, hypercontractile RV apex with hypokinetic RV free wall consistent with McDonnell's sign suggestive of PE. However clinically, his symptom was triggered by underlying CAD, RV dysfunction likely related to RV infarct. Post procedure, patient was placed on aspirin and Brilinta.   He was admitted again on 08/06/2015 for symptom  consistent with unstable angina. He did have mildly elevated troponin at the time as well, with level troponin 0.07 and appears to be flat. Given his concerning symptom, he was taken to the cath lab again on 6/13 which showed 20% proximal to mid LAD lesion, 20% proximal to mid left circumflex lesion, 50% OM1 lesion, widely patent RCA stent, EF 35-40%. There was no explanation for his recurrent chest pain and he was subsequently discharged. He returned [facility] with fatigue, it was felt this would likely deconditioning. His TSH was normal. Vitamin B-12 542. Magnesium normal. CT of the head was unremarkable. Echocardiogram was repeated on 08/25/2015 which showed EF 45-50%, hypokinesis of the basal inferior myocardium, grade 1 diastolic dysfunction, moderately reduced RV function.  According to the patient, he has been compliant with his aspirin and Brilinta at home. He has chronic back pain and this continues to be an issue. Since AM on 7/19 he started having chest pain radiating to the left shoulder and also left knee pain. He described the chest pain as a dull ache lateral to the left breast radiating up to the left shoulder. On physical exam, it hurts to press on the posterior lateral side of the shoulder and also the pain is worse when he tried to raise the arm. As far as the knee pain, he is convinced is related to the chest as well because they occur together. However location-wise, and it appears to be isolated. His knee hurts with bending. His chest discomfort has been ongoing for 12 hours at this time.  EKG was obtained which showed sinus tachycardia with T wave inversion in the inferolateral leads. First point-of-care troponin was 0.24. Cardiology has been consult for atypical chest pain.     No current facility-administered medications on file prior to encounter.   Current Outpatient Prescriptions on File Prior to Encounter  Medication Sig Dispense Refill  . aspirin EC 81  MG tablet Take 1 tablet (81 mg total) by mouth daily. 30 tablet 1  . atorvastatin (LIPITOR) 80 MG tablet Take 1 tablet (80 mg total) by mouth daily at 6 PM. 30 tablet 1  . cyclobenzaprine (FLEXERIL) 10 MG tablet Take 10 mg by mouth 3 (three) times daily as needed for muscle spasms.     . diazepam (VALIUM) 10 MG tablet Take 10 mg by mouth 2 (two) times daily. For spasms    . DULoxetine (CYMBALTA) 60 MG capsule Take 60 mg by mouth daily.    . feeding supplement, ENSURE ENLIVE, (ENSURE ENLIVE) LIQD Take 237 mLs by mouth 2 (two) times daily between meals. 237 mL 12  . furosemide (LASIX) 40 MG tablet Take 1 tablet (40 mg  total) by mouth daily. 30 tablet 6  . lisinopril (PRINIVIL,ZESTRIL) 20 MG tablet Take 20 mg by mouth daily.    . meloxicam (MOBIC) 15 MG tablet Take 15 mg by mouth daily.    . metFORMIN (GLUCOPHAGE-XR) 750 MG 24 hr tablet Take 750 mg by mouth daily with supper.    . metoprolol tartrate (LOPRESSOR) 25 MG tablet Take 1 tablet (25 mg total) by mouth 2 (two) times daily. 180 tablet 3  . nitroGLYCERIN (NITROSTAT) 0.4 MG SL tablet Place 1 tablet (0.4 mg total) under the tongue every 5 (five) minutes as needed for chest pain. 25 tablet 6  . omeprazole (PRILOSEC) 40 MG capsule Take 40 mg by mouth daily.    . Oxycodone HCl 10 MG TABS Take 1 tablet (10 mg total) by mouth every 4 (four) hours. (Patient taking differently: Take 10 mg by mouth every 6 (six) hours. ) 60 tablet 0  . polyethylene glycol powder (GLYCOLAX/MIRALAX) powder Take 17 g by mouth daily as needed (constipation). Mix in 8 oz water, juice, soda, coffee or tea and drink    . Potassium Chloride ER 20 MEQ TBCR Take 20 mEq by mouth once.    . pregabalin (LYRICA) 100 MG capsule Take 100 mg by mouth 3 (three) times daily.     . ticagrelor (BRILINTA) 90 MG TABS tablet Take 1 tablet (90 mg total) by mouth 2 (two) times daily. 60 tablet 1  . topiramate (TOPAMAX) 50 MG tablet Take 50 mg by mouth 2 (two) times daily as needed (nerve pain).  For nerve pain    . traMADol (ULTRAM) 50 MG tablet Take 100 mg by mouth 3 (three) times daily as needed (pain).         Family History Family History  Problem Relation Age of Onset  . Heart disease Mother   . Prostate cancer Brother      Social History Social History   Social History  . Marital Status: Married    Spouse Name: N/A  . Number of Children: 5  . Years of Education: N/A   Occupational History  . 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 Smoking status: Former Smoker -- 15 years    Types: Cigarettes  . Smokeless tobacco: Former Systems developer    Quit date: 04/08/2015     Comment: 5 cig a week  . Alcohol Use: 0.0 oz/week    0 Standard drinks or equivalent per week     Comment: Couple 40 ounces daily until 2012  . Drug Use: No  . Sexual Activity: Not on file   Other Topics Concern  . Not on file   Social History Narrative     Review of Systems  General:  No chills, fever, night sweats or weight changes.  Cardiovascular:  No dyspnea on exertion, edema, orthopnea, palpitations, paroxysmal nocturnal dyspnea. +chest pain radiate to L shoulder, L knee pain Dermatological: No rash, lesions/masses Respiratory: No cough, dyspnea Urologic: No hematuria, dysuria Abdominal:   No nausea, vomiting, diarrhea, bright red blood per rectum, melena, or hematemesis Neurologic:  No visual changes, wkns, changes in mental status. All other systems reviewed and are otherwise negative except as noted above.  Physical Exam  Blood pressure 111/85, pulse 95, temperature 99 F (37.2 C), temperature source Oral, resp. rate 15, height 5\' 10"  (1.778 m), weight 216 lb (97.977 kg), SpO2 94 %.  General: Pleasant, NAD Psych: Normal affect. Neuro: Alert and  oriented X 3. Moves all extremities spontaneously. HEENT: Normal  Neck: Supple without bruits or JVD. Lungs:  Resp regular and unlabored, CTA. Heart: RRR no s3, s4, or  murmurs. Abdomen: Soft, non-tender, non-distended, BS + x 4.  Extremities: No clubbing, cyanosis or edema. DP/PT/Radials 2+ and equal bilaterally.  Labs  No results for input(s): CKTOTAL, CKMB, TROPONINI in the last 72 hours. Lab Results  Component Value Date   WBC 15.3* 09/12/2015   HGB 13.0 09/12/2015   HCT 39.3 09/12/2015   MCV 81.4 09/12/2015   PLT 220 09/12/2015    Recent Labs Lab 09/12/15 1450  NA 136  K 3.7  CL 108  CO2 20*  BUN 18  CREATININE 0.99  CALCIUM 9.3  GLUCOSE 99   Lab Results  Component Value Date   CHOL 149 07/15/2015   HDL 37* 07/15/2015   LDLCALC 96 07/15/2015   TRIG 82 07/15/2015   No results found for: DDIMER  Radiology/Studies  Dg Chest 2 View  08/24/2015  CLINICAL DATA:  60 year old male with central chest pain. EXAM: CHEST  2 VIEW COMPARISON:  Chest radiograph dated 08/05/2015 FINDINGS: Two views of the chest demonstrate bibasilar atelectatic changes. There is no focal consolidation, pleural effusion, or pneumothorax. The cardiac silhouette is within normal limits. No acute osseous pathology identified. A spinal stimulator is noted over the mid thoracic spine. IMPRESSION: No active cardiopulmonary disease. Electronically Signed   By: Anner Crete M.D.   On: 08/24/2015 18:21   Ct Head Wo Contrast  08/24/2015  CLINICAL DATA:  60 year old male with altered mental status. EXAM: CT HEAD WITHOUT CONTRAST CT CERVICAL SPINE WITHOUT CONTRAST TECHNIQUE: Multidetector CT imaging of the head and cervical spine was performed following the standard protocol without intravenous contrast. Multiplanar CT image reconstructions of the cervical spine were also generated. COMPARISON:  Head CT dated 12/01/2011 FINDINGS: CT HEAD FINDINGS The ventricles and sulci are appropriate in size for the patient's age. There is no intracranial hemorrhage. No mass effect or midline shift identified. The gray-white matter differentiation is preserved. There is no extra-axial fluid  collection. Mild mucoperiosteal thickening of paranasal sinuses. No air-fluid levels. The mastoid air cells are clear. The calvarium is intact. CT CERVICAL SPINE FINDINGS There is no acute fracture or subluxation of the cervical spine.There is multilevel degenerative changes most prominent at C5-C6.The odontoid and spinous processes are intact.There is normal anatomic alignment of the C1-C2 lateral masses. The visualized soft tissues appear unremarkable. IMPRESSION: No acute intracranial pathology. No acute/traumatic cervical spine pathology. Electronically Signed   By: Anner Crete M.D.   On: 08/24/2015 22:27   Ct Cervical Spine Wo Contrast  08/24/2015  CLINICAL DATA:  60 year old male with altered mental status. EXAM: CT HEAD WITHOUT CONTRAST CT CERVICAL SPINE WITHOUT CONTRAST TECHNIQUE: Multidetector CT imaging of the head and cervical spine was performed following the standard protocol without intravenous contrast. Multiplanar CT image reconstructions of the cervical spine were also generated. COMPARISON:  Head CT dated 12/01/2011 FINDINGS: CT HEAD FINDINGS The ventricles and sulci are appropriate in size for the patient's age. There is no intracranial hemorrhage. No mass effect or midline shift identified. The gray-white matter differentiation is preserved. There is no extra-axial fluid collection. Mild mucoperiosteal thickening of paranasal sinuses. No air-fluid levels. The mastoid air cells are clear. The calvarium is intact. CT CERVICAL SPINE FINDINGS There is no acute fracture or subluxation of the cervical spine.There is multilevel degenerative changes most prominent at C5-C6.The odontoid and spinous processes are intact.There is  normal anatomic alignment of the C1-C2 lateral masses. The visualized soft tissues appear unremarkable. IMPRESSION: No acute intracranial pathology. No acute/traumatic cervical spine pathology. Electronically Signed   By: Anner Crete M.D.   On: 08/24/2015 22:27     ECG  Sinus tachycardia with T wave inversion in the inferior lateral leads, LVH noted.   ASSESSMENT AND PLAN  1. Atypical chest pain radiating to the left shoulder and also separate left knee pain  - Patient is convinced the left knee pain is related to the chest pain as they occur together. However left shoulder pain itself is reproducible by extending the left arm and also left knee pain is reproducible with flexing of the knee.  - Recent relook cardiac catheterization in June is reassuring, showing patent RCA stent that was previously placed in May.  - His blood pressure is stable, his heart rate is mildly elevated, however likely related to his current pain.   2. CAD s/p PCI to RCA in 06/2015  - cath 5/21 single-vessel occlusive CAD with 100% proximal RCA lesion treated with drug-eluting stent, 20% proximal to mid LAD lesion, 20% left circumflex lesion, 30% OM1, EF 45-50%  - cath 6/13 20% proximal to mid LAD lesion, 20% proximal to mid left circumflex lesion, 50% OM1 lesion, widely patent RCA stent, EF 35-40%  - Echocardiogram 08/25/2015 which showed EF 45-50%, hypokinesis of the basal inferior myocardium, grade 1 diastolic dysfunction, moderately reduced RV function.  3. HTN: stable SBP 110s  4. HLD: on lipitor  5. DM II: on metformin  7. chronic back pain on disability   Signed, Almyra Deforest, PA-C 09/12/2015, 4:50 PM Patient seen and examined and history reviewed. Agree with above findings and plan. 60 yo BM well known to our service. S/p inferior MI in May with stenting of the proximal RCA. Troponin peak >37. Since then he has continued to have atypical chest pain. Admitted in early June. Mild troponin elevation. Repeat cardiac cath without significant disease. Admitted later in June- troponin still mildly elevated. Now presents with acute pain in left side that awoke him from sleep. Pain in left shoulder and axilla. Unable to raise his arm due to pain. Also had a "pop" in left knee  with severe knee pain. Pain continued throughout the day. He did have some fleeting pain across his chest but did not last. Denies any strenuous activity or lifting. Spends much of his time sitting in a chair in his yard under a shade tree. On exam he has pain on palpation of the left shoulder joint and chest muscles in the anterior axilla. The left knee has a mild effusion and is painful on flexion. Cardiac exam is normal. Ecg shows sinus tachycardia with old Q waves in inferior leads. ST-T changes inferiorly have improved. CXR shows NAD.  Troponin 0.24 by i stat  Based on history and physical findings I do not think he has any active cardiac issues. His pain is more musculoskeletal.  He has low grade troponin elevation for the past 2 months with extensive repeat cardiac evaluation including cardiac cath and Echo.  At this point no further cardiac evaluation or treatment needed. Continue prior meds.  Will defer to EDP for treatment of his acute pain.  Veryl Abril Martinique, [place] 09/12/2015 5:39 PM

## 2015-09-12 NOTE — ED Notes (Signed)
patient here with central chest pain with radiation to left arm that started yesterday. Took 1 SL NTG with chest pain relief but no arm pain relief. This am recurrent pain with ongoing left arm pain, took 2 sl ntg with no arm pain relief. States MI in May.

## 2015-09-12 NOTE — Discharge Instructions (Signed)
Follow-up with your family doctor next week. Take your pain medicine

## 2015-09-12 NOTE — Telephone Encounter (Signed)
Called Mary the physical therapist with home health. She stated patient has been having chest pain that has been relieved with Nitro, low grade temp. 99.8, elevated HR 109, BP 136/84, left shoulder pain, and weakness. Patient has taken Nitroglycerin twice, informed Stanton Kidney that patient should go to ED to be evaluated further for his chest pain. Encouraged Stanton Kidney to call patient's PCP as well. Mary verbalized understanding.

## 2015-09-17 ENCOUNTER — Other Ambulatory Visit: Payer: Self-pay

## 2015-09-17 MED ORDER — TICAGRELOR 90 MG PO TABS: 90.0000 mg | ORAL_TABLET | Freq: Two times a day (BID) | ORAL | 2 refills | Status: DC

## 2015-10-03 NOTE — Progress Notes (Signed)
Cardiology Office Note:    Date:  10/04/2015   ID:  DIMITRIOUS TUNIS, DOB [birth year], MRN [id number]  PCP:  [name], MD  Cardiologist:  Dr. Daneen Schick   Electrophysiologist:  n/a  Referring MD: [name], MD   Chief Complaint  Patient presents with  . Follow-up    CAD   History of Present Illness:    AYDEEN STRATHMAN is a 60 y.o. male with a hx of CAD, diabetes, HTN, HL, chronic back pain.  Admitted in 5/17 with a non-STEMI. LHC demonstrated an occluded proximal RCA which was treated with aspiration thrombectomy and DES. Echocardiogram demonstrated mild LVH with normal LV function and normal wall motion, moderate RVE and hypocontractility of the RV apex.  .    Admitted in 6/17 with symptoms of unstable angina and minimally elevated troponin levels without clear trend. LHC demonstrated patent RCA stent and no significant CAD elsewhere. There was an OM1 50% stenosis. 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  Readmitted 6/30-7/2 with generalized fatigue. Follow-up echocardiogram demonstrated EF 45-50% with inferior hypokinesis and mild diastolic dysfunction. RVSF was moderately reduced. Patient did have mildly elevated troponin levels without clear trend.   I saw him in follow-up 09/07/15. He had significant symptoms of depression. He was asked to follow-up with primary care. Since last seen, he presented again to the emergency room with chest discomfort. He continued to have mildly elevated troponin levels. He was seen by Dr. Martinique. His symptoms did not seem to be consistent with an acute coronary event. Medical therapy was continued.   He returns for follow-up. Here alone.  His wife is now in the hospital with CHF.  He has completed HHPT.  He starts [facility] next week.  He remains weak.  He denies further chest pain. He some some dyspnea in the mornings, but otherwise denies significant dyspnea. He denies orthopnea, PND, edema.  He  denies syncope.   Prior CV studies that were reviewed today include:    Echo 08/25/15 EF 45-50%, inferior HK, red 1 diastolic dysfunction, mildly dilated aortic root, moderately reduced RVSF, mild RAE  LHC 08/07/15 LAD proximal 20% LCx proximal 20% OM1 50% RCA proximal stent patent EF 35-45% LVEDP 23 mmHg No obvious explanation for the patient's ongoing chest pain complaints.  Echo 07/15/15 Mild LVH, EF 50-55%, normal wall motion, moderate RVE  LHC 07/15/15 LAD proximal 20% LCx proximal 20%, OM1 30% RCA proximal 100% EF 45-50% with inferior wall motion abnormalities PCI: STENT PROMUS PREM MR 3.5X24 To the RCA   Past Medical History:  Diagnosis Date  . Arthritis    Rheumatoid  . Baker's cyst    Left calf  . CAD in native artery, 07/15/15 PCI of RCA with DES 07/16/2015   a.   NSTEMI 5/17: LHC - pLAD 20, pLCx 20, OM1 30, pRCA 100, EF 45-50%>> PCI: 2.5 x 24 mm Promus DES to RCA  //  b.   Echo 5/17: mild LVH, EF 50-55%, no RWMA, mod RVE //  c. LHC 6/17: pLAD 20, pLCx 20, OM1 50, pRCA stent ok, EF 35-45% with mod sized inf wall and basal segment aneurysm  . Chronic back pain   . Cyst (solitary) of breast    ? cyst left calf ,knee  . Diabetes mellitus    diet controlled  . GERD (gastroesophageal reflux disease)   . Hyperlipidemia   . Hypertension    Dr. [name] [phone]  .  Ischemic cardiomyopathy    a. LV-gram at time of LHC in 6/17 with EF 35-45%  //  b. Echo 7/17: EF 45-50%, inferior HK, grade 1 diastolic dysfunction, mildly dilated aortic root, moderately reduced RVSF, mild RAE  . Myocardial infarction ([place]) 2017  . 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 CARDIAC CATHETERIZATION N/A 07/15/2015   Procedure: Left Heart Cath and Coronary Angiography;  Surgeon: [name], MD;  Location: [facility];  Service: Cardiovascular;  Laterality: N/A;  . CARDIAC CATHETERIZATION N/A 07/15/2015    Procedure: Coronary Stent Intervention;  Surgeon: [name], MD;  Location: [facility];  Service: Cardiovascular;  Laterality: N/A;  . CARDIAC CATHETERIZATION N/A 08/07/2015   Procedure: Left Heart Cath and Coronary Angiography;  Surgeon: [name], MD;  Location: [facility];  Service: Cardiovascular;  Laterality: N/A;  . CORONARY STENT PLACEMENT  07/2015  . KNEE SURGERY    . LUMBAR SPINE SURGERY  03/2009  & 2012  . MASS EXCISION N/A 04/19/2012   Procedure: removal of posterior cervical lipoma;  Surgeon: [name], MD;  Location: [facility];  Service: Neurosurgery;  Laterality: N/A;  Removal of posterior cervical lipoma  . SPINAL CORD STIMULATOR INSERTION N/A 01/07/2013   Procedure:  SPINAL CORD STIMULATOR INSERTION;  Surgeon: [name], MD;  Location: [facility];  Service: Neurosurgery;  Laterality: N/A;  . WISDOM TOOTH EXTRACTION     hx of    Current Medications: Outpatient Medications Prior to Visit  Medication Sig Dispense Refill  . aspirin EC 81 MG tablet Take 1 tablet (81 mg total) by mouth daily. 30 tablet 1  . atorvastatin (LIPITOR) 80 MG tablet Take 1 tablet (80 mg total) by mouth daily at 6 PM. 30 tablet 1  . cyclobenzaprine (FLEXERIL) 10 MG tablet Take 10 mg by mouth 3 (three) times daily as needed for muscle spasms.     . diazepam (VALIUM) 10 MG tablet Take 10 mg by mouth 2 (two) times daily. For spasms    . DULoxetine (CYMBALTA) 60 MG capsule Take 60 mg by mouth daily.    . feeding supplement, ENSURE ENLIVE, (ENSURE ENLIVE) LIQD Take 237 mLs by mouth 2 (two) times daily between meals. 237 mL 12  . feeding supplement, GLUCERNA SHAKE, (GLUCERNA SHAKE) LIQD Take 237 mLs by mouth 2 (two) times daily between meals.    . furosemide (LASIX) 40 MG tablet Take 1 tablet (40 mg total) by mouth daily. 30 tablet 6  . lisinopril (PRINIVIL,ZESTRIL) 20 MG tablet Take 20 mg by mouth at bedtime.     . metFORMIN (GLUCOPHAGE-XR) 750 MG 24 hr tablet Take 750 mg  by mouth daily with supper.    . metoprolol tartrate (LOPRESSOR) 25 MG tablet Take 1 tablet (25 mg total) by mouth 2 (two) times daily. 180 tablet 3  . nitroGLYCERIN (NITROSTAT) 0.4 MG SL tablet Place 1 tablet (0.4 mg total) under the tongue every 5 (five) minutes as needed for chest pain. 25 tablet 6  . omeprazole (PRILOSEC) 40 MG capsule Take 40 mg by mouth daily.    . polyethylene glycol powder (GLYCOLAX/MIRALAX) powder Take 17 g by mouth daily as needed (constipation). Mix in 8 oz water, juice, soda, coffee or tea and drink    . predniSONE (DELTASONE) 20 MG tablet 2 tabs po daily x 3 days 6 tablet 0  . pregabalin (  LYRICA) 100 MG capsule Take 100 mg by mouth 3 (three) times daily.     . ticagrelor (BRILINTA) 90 MG TABS tablet Take 1 tablet (90 mg total) by mouth 2 (two) times daily. 60 tablet 2  . topiramate (TOPAMAX) 50 MG tablet Take 50 mg by mouth 2 (two) times daily as needed (nerve pain). For nerve pain    . traMADol (ULTRAM) 50 MG tablet Take 100 mg by mouth 2 (two) times daily.     . Oxycodone HCl 10 MG TABS Take 1 tablet (10 mg total) by mouth every 4 (four) hours. (Patient not taking: Reported on 10/04/2015) 60 tablet 0  . Potassium Chloride ER 20 MEQ TBCR Take 20 mEq by mouth once. (Patient not taking: Reported on 10/04/2015)     No facility-administered medications prior to visit.       Allergies:   Toradol [ketorolac tromethamine]; Adhesive [tape]; and Other   Social History   Social History  . Marital status: Married    Spouse name: N/A  . Number of children: 5  . Years of education: N/A   Occupational History  . 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 Smoking status: Former Smoker    Years: 15.00    Types: Cigarettes  . Smokeless tobacco: Former Systems developer    Quit date: 04/08/2015     Comment: 5 cig a week  . Alcohol use 0.0 oz/week     Comment: Couple 40 ounces daily until 2012  . Drug use: No  . Sexual activity:  Not Asked   Other Topics Concern  . None   Social History Narrative  . None     Family History:  The patient's family history includes Heart disease in his mother; Prostate cancer in his brother.   ROS:   Please see the history of present illness.    ROS All other systems reviewed and are negative.   EKGs/Labs/Other Test Reviewed:    EKG:  EKG is   ordered today.  The ekg ordered today demonstrates NSR, HR 77, normal axis, inferior Q waves, T-wave inversion 3, aVF, QTc 436 ms, no significant change  Recent Labs: 08/24/2015: ALT 24 08/25/2015: Magnesium 1.8; TSH 0.388 09/12/2015: BUN 18; Creatinine, Ser 0.99; Hemoglobin 13.0; Platelets 220; Potassium 3.7; Sodium 136   Recent Lipid Panel    Component Value Date/Time   CHOL 149 07/15/2015 0717   TRIG 82 07/15/2015 0717   HDL 37 (L) 07/15/2015 0717   CHOLHDL 4.0 07/15/2015 0717   VLDL 16 07/15/2015 0717   LDLCALC 96 07/15/2015 0717    Physical Exam:    VS:  BP 136/70   Pulse 78   Ht 5\' 10"  (1.778 m)   Wt 223 lb 6.4 oz (101.3 kg)   BMI 32.05 kg/m     Wt Readings from Last 3 Encounters:  10/04/15 223 lb 6.4 oz (101.3 kg)  09/12/15 216 lb (98 kg)  09/07/15 216 lb 12.8 oz (98.3 kg)     Physical Exam  Constitutional: He is oriented to person, place, and time. He appears well-developed and well-nourished. No distress.  HENT:  Head: Normocephalic and atraumatic.  Eyes: No scleral icterus.  Neck: Normal range of motion. No JVD present.  Cardiovascular: Normal rate, regular rhythm, S1 normal and S2 normal.  Exam reveals no gallop and no friction rub.   No murmur heard. Pulmonary/Chest: Effort normal and breath sounds normal. He has no wheezes.  He has no rhonchi. He has no rales.  Abdominal: Soft. He exhibits no distension and no mass. There is no tenderness.  Musculoskeletal: Normal range of motion. He exhibits no edema.  Neurological: He is alert and oriented to person, place, and time.  Skin: Skin is warm and dry.    Psychiatric: He has a normal mood and affect.    ASSESSMENT:    1. Coronary artery disease involving native coronary artery of native heart without angina pectoris   2. Chronic combined systolic and diastolic CHF (congestive heart failure) ([place])   3. Ischemic cardiomyopathy   4. Essential hypertension   5. Hyperlipidemia    PLAN:    In order of problems listed above:  1. CAD - He is status post non-STEMI in 5/17 treated with DES to the RCA. Repeat LHC in 6/17 demonstrated patent RCA stent, mild plaque in the LAD and LCx in moderate nonobstructive disease in the OM1. EF was 35-45% with inferior wall aneurysm. Follow-up echo with inferior hypokinesis and EF 45-50%. He has continued to have recurrent episodes of chest discomfort. Last seen in the emergency room 09/12/15. He continues to have chronically elevated troponin levels.  Since last seen, he has done well without recurrent chest pain.  -  Continue aspirin, Brilinta, statin, beta blocker, ACE inhibitor.  -  He starts cardiac rehab next week.   2. Combined systolic and diastolic CHF - NYHA [id number].  Continue ACEI, beta blocker.  BMET today.   3. Ischemic CM - EF 45-50% by most recent echocardiogram. Continue beta blocker, ACE inhibitor.   4. HTN -  Controlled.   5. HL - Continue statin.  Lipids and LFTs to be checked today.     Medication Adjustments/Labs and Tests Ordered: Current medicines are reviewed at length with the patient today.  Concerns regarding medicines are outlined above.  Medication changes, Labs and Tests ordered today are outlined in the Patient Instructions noted below. Patient Instructions  Medication Instructions:  No changes.  Continue your current medications.  Labwork: Today - BMET, Lipids, LFTs  Testing/Procedures: None   Follow-Up: Dr. Daneen Schick in 4 months.  Any Other Special Instructions Will Be Listed Below (If Applicable).  If you need a refill on your cardiac medications before your  next appointment, please call your pharmacy.   Signed, Richardson Dopp, PA-C  10/04/2015 10:13 AM    [facility] [address], [place], [place]  [address] Phone: [phone]; Fax: [phone]

## 2015-10-04 ENCOUNTER — Encounter (INDEPENDENT_AMBULATORY_CARE_PROVIDER_SITE_OTHER): Payer: Self-pay

## 2015-10-04 ENCOUNTER — Encounter: Payer: Self-pay | Admitting: Physician Assistant

## 2015-10-04 ENCOUNTER — Ambulatory Visit (INDEPENDENT_AMBULATORY_CARE_PROVIDER_SITE_OTHER): Payer: Medicare Other | Admitting: Physician Assistant

## 2015-10-04 ENCOUNTER — Other Ambulatory Visit: Payer: Medicare Other | Admitting: *Deleted

## 2015-10-04 VITALS — BP 136/70 | HR 78 | Ht 70.0 in | Wt 223.4 lb

## 2015-10-04 DIAGNOSIS — I2510 Atherosclerotic heart disease of native coronary artery without angina pectoris: Secondary | ICD-10-CM

## 2015-10-04 DIAGNOSIS — E785 Hyperlipidemia, unspecified: Secondary | ICD-10-CM

## 2015-10-04 DIAGNOSIS — I5042 Chronic combined systolic (congestive) and diastolic (congestive) heart failure: Secondary | ICD-10-CM | POA: Diagnosis not present

## 2015-10-04 DIAGNOSIS — I10 Essential (primary) hypertension: Secondary | ICD-10-CM

## 2015-10-04 DIAGNOSIS — I255 Ischemic cardiomyopathy: Secondary | ICD-10-CM

## 2015-10-04 LAB — LIPID PANEL
Cholesterol: 124 mg/dL — ABNORMAL LOW (ref 125–200)
HDL: 39 mg/dL — ABNORMAL LOW (ref >=40)
LDL Cholesterol: 68 mg/dL (ref <130)
Total CHOL/HDL Ratio: 3.2 Ratio (ref <=5.0)
Triglycerides: 85 mg/dL (ref <150)
VLDL: 17 mg/dL (ref <30)

## 2015-10-04 LAB — BASIC METABOLIC PANEL
BUN: 11 mg/dL (ref 7–25)
CO2: 27 mmol/L (ref 20–31)
Calcium: 9.6 mg/dL (ref 8.6–10.3)
Chloride: 105 mmol/L (ref 98–110)
Creat: 0.99 mg/dL (ref 0.70–1.25)
Glucose, Bld: 87 mg/dL (ref 65–99)
Potassium: 3.6 mmol/L (ref 3.5–5.3)
Sodium: 135 mmol/L (ref 135–146)

## 2015-10-04 LAB — HEPATIC FUNCTION PANEL
ALT: 36 U/L (ref 9–46)
AST: 27 U/L (ref 10–35)
Albumin: 4.3 g/dL (ref 3.6–5.1)
Alkaline Phosphatase: 90 U/L (ref 40–115)
Bilirubin, Direct: 0.1 mg/dL (ref <=0.2)
Indirect Bilirubin: 0.3 mg/dL (ref 0.2–1.2)
Total Bilirubin: 0.4 mg/dL (ref 0.2–1.2)
Total Protein: 6.9 g/dL (ref 6.1–8.1)

## 2015-10-04 NOTE — Patient Instructions (Signed)
Medication Instructions:  No changes.  Continue your current medications.  Labwork: Today - BMET, Lipids, LFTs  Testing/Procedures: None   Follow-Up: Dr. Daneen Schick in 4 months.  Any Other Special Instructions Will Be Listed Below (If Applicable).  If you need a refill on your cardiac medications before your next appointment, please call your pharmacy.

## 2015-10-04 NOTE — Addendum Note (Signed)
Addended by: Eulis Foster on: 10/04/2015 10:18 AM   Modules accepted: Orders

## 2015-10-05 ENCOUNTER — Telehealth: Payer: Self-pay | Admitting: *Deleted

## 2015-10-05 NOTE — Telephone Encounter (Signed)
Lmtcb to go over lab results 

## 2015-10-05 NOTE — Telephone Encounter (Signed)
Pt notified of lab results by phone with verbal understanding.  

## 2015-10-09 ENCOUNTER — Inpatient Hospital Stay (HOSPITAL_COMMUNITY): Admission: RE | Admit: 2015-10-09 | Payer: Medicare Other | Source: Ambulatory Visit

## 2015-10-10 ENCOUNTER — Telehealth: Payer: Self-pay | Admitting: Interventional Cardiology

## 2015-10-10 NOTE — Telephone Encounter (Signed)
New Message:    Wife called and said Joann at [facility] said she did not have all the papers from Dr [name] so [facility] could start Rehab.

## 2015-10-10 NOTE — Telephone Encounter (Signed)
Completed [facility] cardiac rehab ref form refaxed to [facility] cardiac rehab

## 2015-10-15 ENCOUNTER — Encounter (HOSPITAL_COMMUNITY): Payer: Medicare Other

## 2015-10-17 ENCOUNTER — Encounter (HOSPITAL_COMMUNITY): Payer: Medicare Other

## 2015-10-19 ENCOUNTER — Encounter (HOSPITAL_COMMUNITY): Payer: Medicare Other

## 2015-10-22 ENCOUNTER — Telehealth: Payer: Self-pay | Admitting: Interventional Cardiology

## 2015-10-22 ENCOUNTER — Encounter (HOSPITAL_COMMUNITY): Payer: Medicare Other

## 2015-10-22 NOTE — Telephone Encounter (Signed)
Walk in pt form-pt has questions about cardiac rehab-lisa back Wednesday will give to her then.

## 2015-10-24 ENCOUNTER — Ambulatory Visit (HOSPITAL_COMMUNITY)
Admission: RE | Admit: 2015-10-24 | Discharge: 2015-10-24 | Disposition: A | Discharge status: Home / Self Care | Payer: Medicare Other | Source: Ambulatory Visit | Attending: Internal Medicine | Admitting: Internal Medicine

## 2015-10-24 ENCOUNTER — Other Ambulatory Visit (HOSPITAL_COMMUNITY): Payer: Self-pay | Admitting: Internal Medicine

## 2015-10-24 ENCOUNTER — Encounter (HOSPITAL_COMMUNITY): Payer: Medicare Other

## 2015-10-24 DIAGNOSIS — M79604 Pain in right leg: Secondary | ICD-10-CM | POA: Diagnosis present

## 2015-10-24 DIAGNOSIS — R52 Pain, unspecified: Secondary | ICD-10-CM

## 2015-10-24 DIAGNOSIS — R2241 Localized swelling, mass and lump, right lower limb: Secondary | ICD-10-CM | POA: Insufficient documentation

## 2015-10-26 ENCOUNTER — Encounter (HOSPITAL_COMMUNITY): Payer: Medicare Other

## 2015-10-30 ENCOUNTER — Ambulatory Visit (HOSPITAL_COMMUNITY): Payer: Medicare Other

## 2015-10-31 ENCOUNTER — Encounter (HOSPITAL_COMMUNITY): Payer: Medicare Other

## 2015-11-02 ENCOUNTER — Encounter (HOSPITAL_COMMUNITY): Payer: Medicare Other

## 2015-11-05 ENCOUNTER — Encounter (HOSPITAL_COMMUNITY): Payer: Medicare Other

## 2015-11-06 ENCOUNTER — Telehealth (HOSPITAL_COMMUNITY): Payer: Self-pay

## 2015-11-06 ENCOUNTER — Encounter (HOSPITAL_COMMUNITY)
Admission: RE | Admit: 2015-11-06 | Discharge: 2015-11-06 | Disposition: A | Discharge status: Home / Self Care | Payer: Medicare Other | Source: Ambulatory Visit | Attending: Interventional Cardiology | Admitting: Interventional Cardiology

## 2015-11-06 ENCOUNTER — Ambulatory Visit (HOSPITAL_COMMUNITY): Payer: Medicare Other

## 2015-11-06 VITALS — BP 140/90 | HR 78 | Ht 70.25 in | Wt 224.0 lb

## 2015-11-06 DIAGNOSIS — I213 ST elevation (STEMI) myocardial infarction of unspecified site: Secondary | ICD-10-CM | POA: Insufficient documentation

## 2015-11-06 DIAGNOSIS — Z955 Presence of coronary angioplasty implant and graft: Secondary | ICD-10-CM | POA: Diagnosis not present

## 2015-11-06 DIAGNOSIS — Z79899 Other long term (current) drug therapy: Secondary | ICD-10-CM | POA: Insufficient documentation

## 2015-11-06 DIAGNOSIS — I214 Non-ST elevation (NSTEMI) myocardial infarction: Secondary | ICD-10-CM

## 2015-11-06 DIAGNOSIS — Z87891 Personal history of nicotine dependence: Secondary | ICD-10-CM | POA: Insufficient documentation

## 2015-11-06 NOTE — Telephone Encounter (Signed)
Pt reported at cardiac rehab orientation that he has frequent NTG SL use, about once a week.   Pt denies pain, reports he takes NTG when he is anxious or upset.  Pt reports NTG SL x1 yesterday and NTG SL x2 last Wednesday.  Pt denies symptoms today.  PC to [employer], [place].  Reviewed pt symptoms, NTG use and Pt history with Suanne Marker.  Suanne Marker gave verbal order for pt to proceed with cardiac rehab exercise including 6 minute walk test today.

## 2015-11-06 NOTE — Telephone Encounter (Signed)
pc to [facility]. Spoke with Dr. Donell Sievert nurse to seek clearance for pt to participate in cardiac rehab activities. No activity restrictions v.o. Jennifer/Dr. Harkins.  Form faxed to [phone] for written clearance.

## 2015-11-06 NOTE — Progress Notes (Signed)
Cardiac Rehab Medication Review by a Pharmacist  Does the patient  feel that his/her medications are working for him/her?  yes  Has the patient been experiencing any side effects to the medications prescribed?  no  Does the patient measure his/her own blood pressure or blood glucose at home?  no   Does the patient have any problems obtaining medications due to transportation or finances?   Yes - Ensure shakes   Understanding of regimen: good Understanding of indications: good Potential of compliance: good    Pharmacist comments: Patient is a 60 yo male who presents to cardiac rehab in good spirits. He is quite familiar with his medication regimen and his wife also helps him manage it. He uses a pill box at home. Discussed importance of his new metoprolol and Brilinta medications. Patient did not endorse any major issues, however he is not able to afford his shake supplements. He is currently taking a multivitamin for heart health.   Demetrius Charity, PharmD Acute Care Pharmacy Resident  Pager: [phone] 11/06/2015

## 2015-11-07 ENCOUNTER — Encounter (HOSPITAL_COMMUNITY): Payer: Medicare Other

## 2015-11-09 ENCOUNTER — Encounter (HOSPITAL_COMMUNITY): Payer: Medicare Other

## 2015-11-09 ENCOUNTER — Encounter (HOSPITAL_COMMUNITY): Payer: Self-pay

## 2015-11-09 NOTE — Progress Notes (Signed)
Cardiac Individual Treatment Plan  Patient Details  Name: Thomas Mcclure MRN: [id number] Date of Birth: 1955-07-19 Referring Provider:   Flowsheet Row CARDIAC REHAB PHASE II ORIENTATION from 11/06/2015 in [facility]  Referring Provider  [name]      Initial Encounter Date:  [place] PHASE II ORIENTATION from 11/06/2015 in [facility]  Date  11/06/15  Referring Provider  [name]      Visit Diagnosis: NSTEMI (non-ST elevated myocardial infarction) ([place])  S/P coronary artery stent placement  Patient's Home Medications on Admission:  Current Outpatient Prescriptions:  .  aspirin EC 81 MG tablet, Take 1 tablet (81 mg total) by mouth daily., Disp: 30 tablet, Rfl: 1 .  atorvastatin (LIPITOR) 80 MG tablet, Take 1 tablet (80 mg total) by mouth daily at 6 PM., Disp: 30 tablet, Rfl: 1 .  cyclobenzaprine (FLEXERIL) 10 MG tablet, Take 10 mg by mouth 3 (three) times daily as needed for muscle spasms. , Disp: , Rfl:  .  diazepam (VALIUM) 10 MG tablet, Take 10 mg by mouth 2 (two) times daily. For spasms, Disp: , Rfl:  .  DULoxetine (CYMBALTA) 60 MG capsule, Take 60 mg by mouth daily., Disp: , Rfl:  .  furosemide (LASIX) 40 MG tablet, Take 1 tablet (40 mg total) by mouth daily., Disp: 30 tablet, Rfl: 6 .  lisinopril (PRINIVIL,ZESTRIL) 20 MG tablet, Take 20 mg by mouth at bedtime. , Disp: , Rfl:  .  metFORMIN (GLUCOPHAGE-XR) 750 MG 24 hr tablet, Take 750 mg by mouth daily with supper., Disp: , Rfl:  .  metoprolol tartrate (LOPRESSOR) 25 MG tablet, Take 1 tablet (25 mg total) by mouth 2 (two) times daily., Disp: 180 tablet, Rfl: 3 .  Multiple Vitamin (MULTIVITAMIN WITH MINERALS) TABS tablet, Take 1 tablet by mouth daily., Disp: , Rfl:  .  nitroGLYCERIN (NITROSTAT) 0.4 MG SL tablet, Place 1 tablet (0.4 mg total) under the tongue every 5 (five) minutes as needed for chest pain., Disp: 25 tablet, Rfl: 6 .   omeprazole (PRILOSEC) 40 MG capsule, Take 40 mg by mouth daily., Disp: , Rfl:  .  Oxycodone HCl 10 MG TABS, Take 10 mg by mouth every 4 (four) hours as needed (pain). , Disp: , Rfl:  .  polyethylene glycol powder (GLYCOLAX/MIRALAX) powder, Take 17 g by mouth daily as needed (constipation). Mix in 8 oz water, juice, soda, coffee or tea and drink, Disp: , Rfl:  .  potassium chloride SA (K-DUR,KLOR-CON) 20 MEQ tablet, Take 20 mEq by mouth 2 (two) times daily., Disp: , Rfl:  .  pregabalin (LYRICA) 100 MG capsule, Take 100 mg by mouth 3 (three) times daily. , Disp: , Rfl:  .  ticagrelor (BRILINTA) 90 MG TABS tablet, Take 1 tablet (90 mg total) by mouth 2 (two) times daily., Disp: 60 tablet, Rfl: 2 .  topiramate (TOPAMAX) 50 MG tablet, Take 50 mg by mouth 2 (two) times daily as needed (nerve pain). For nerve pain, Disp: , Rfl:  .  traMADol (ULTRAM) 50 MG tablet, Take 100 mg by mouth 2 (two) times daily. , Disp: , Rfl:  .  feeding supplement, ENSURE ENLIVE, (ENSURE ENLIVE) LIQD, Take 237 mLs by mouth 2 (two) times daily between meals. (Patient not taking: Reported on 11/06/2015), Disp: 237 mL, Rfl: 12 .  feeding supplement, GLUCERNA SHAKE, (GLUCERNA SHAKE) LIQD, Take 237 mLs by mouth 2 (two) times daily between meals., Disp: ,  Rfl:   Past Medical History: Past Medical History:  Diagnosis Date  . Arthritis    Rheumatoid  . Baker's cyst    Left calf  . CAD in native artery, 07/15/15 PCI of RCA with DES 07/16/2015   a.   NSTEMI 5/17: LHC - pLAD 20, pLCx 20, OM1 30, pRCA 100, EF 45-50%>> PCI: 2.5 x 24 mm Promus DES to RCA  //  b.   Echo 5/17: mild LVH, EF 50-55%, no RWMA, mod RVE //  c. LHC 6/17: pLAD 20, pLCx 20, OM1 50, pRCA stent ok, EF 35-45% with mod sized inf wall and basal segment aneurysm  . Chronic back pain   . Cyst (solitary) of breast    ? cyst left calf ,knee  . Diabetes mellitus    diet controlled  . GERD (gastroesophageal reflux disease)   . Hyperlipidemia   . Hypertension    Dr. [name] [phone]  . Ischemic cardiomyopathy    a. LV-gram at time of LHC in 6/17 with EF 35-45%  //  b. Echo 7/17: EF 45-50%, inferior HK, grade 1 diastolic dysfunction, mildly dilated aortic root, moderately reduced RVSF, mild RAE  . Myocardial infarction ([place]) 2017  . 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 Former Smoker  . Years: 15.00  . Types: Cigarettes  Smokeless Tobacco  . Former Systems developer  . Quit date: 04/08/2015    Comment: 5 cig a week    Labs: Recent Review Flowsheet Data    Labs for ITP Cardiac and Pulmonary Rehab Latest Ref Rng & Units 04/08/2010 10/07/2010 12/11/2012 07/15/2015 10/04/2015   Cholestrol 125 - 200 mg/dL - - - 149 124(L)   LDLCALC <130 mg/dL - - - 96 68   HDL >=40 mg/dL - - - 37(L) 39(L)   Trlycerides <150 mg/dL - - - 82 85   Hemoglobin A1c 4.8 - 5.6 % 6.8(H) - - 6.6(H) -   TCO2 0 - 100 mmol/L - 25 24 25  -      Capillary Blood Glucose: Lab Results  Component Value Date   GLUCAP 117 (H) 08/24/2015   GLUCAP 96 07/18/2015   GLUCAP 127 (H) 07/18/2015   GLUCAP 109 (H) 07/17/2015   GLUCAP 117 (H) 07/17/2015     Exercise Target Goals: Date: 11/06/15  Exercise Program Goal: Individual exercise prescription set with THRR, safety & activity barriers. Participant demonstrates ability to understand and report RPE using BORG scale, to self-measure pulse accurately, and to acknowledge the importance of the exercise prescription.  Exercise Prescription Goal: Starting with aerobic activity 30 plus minutes a day, 3 days per week for initial exercise prescription. Provide home exercise prescription and guidelines that participant acknowledges understanding prior to discharge.  Activity Barriers & Risk Stratification:     Activity Barriers & Cardiac Risk Stratification - 11/06/15 1512      Activity Barriers & Cardiac Risk Stratification   Activity Barriers Back Problems;Joint Problems;History of  Falls;Balance Concerns;Assistive Device;Arthritis   Comments peripheral neuropathy, RA   Cardiac Risk Stratification High      6 Minute Walk:     6 Minute Walk    Row Name 11/06/15 1651         6 Minute Walk   Phase Initial     Distance 1286 feet     Walk Time 6 minutes     # of Rest Breaks 0     MPH 2.44  METS 3.12     RPE 11     VO2 Peak 10.91     Symptoms Yes (comment)     Comments C/o ,mild left hip pain     Resting HR 78 bpm     Resting BP 130/80     Max Ex. HR 85 bpm     Max Ex. BP 142/84     2 Minute Post BP 118/84        Initial Exercise Prescription:     Initial Exercise Prescription - 11/09/15 1600      Date of Initial Exercise RX and Referring Provider   Date 11/06/15   Referring Provider [name]     Bike   Level 1.1   Minutes 10   METs 3.08     NuStep   Level 2   Minutes 10   METs 2.5     Track   Laps 10   Minutes 10   METs 2.74     Prescription Details   Frequency (times per week) 3   Duration Progress to 30 minutes of continuous aerobic without signs/symptoms of physical distress     Intensity   THRR 40-80% of Max Heartrate 64-128   Ratings of Perceived Exertion 11-13   Perceived Dyspnea 0-4     Progression   Progression Continue to progress workloads to maintain intensity without signs/symptoms of physical distress.     Resistance Training   Training Prescription Yes   Weight 2lbs   Reps 10-12      Perform Capillary Blood Glucose checks as needed.  Exercise Prescription Changes:   Exercise Comments:   Discharge Exercise Prescription (Final Exercise Prescription Changes):   Nutrition:  Target Goals: Understanding of nutrition guidelines, daily intake of sodium 1500mg , cholesterol 200mg , calories 30% from fat and 7% or less from saturated fats, daily to have 5 or more servings of fruits and vegetables.  Biometrics:     Pre Biometrics - 11/06/15 1500      Pre Biometrics   Height 5' 10.25" (1.784  m)   Weight 223 lb 15.8 oz (101.6 kg)   Waist Circumference 44 inches   Hip Circumference 44 inches   Waist to Hip Ratio 1 %   BMI (Calculated) 32   Triceps Skinfold 8.5 mm   % Body Fat 28.2 %   Grip Strength 53 kg   Flexibility --  Not performed due to back issues.   Single Leg Stand 1.09 seconds       Nutrition Therapy Plan and Nutrition Goals:     Nutrition Therapy & Goals - 11/07/15 1409      Nutrition Therapy   Diet Carb Modified, Therapeutic Lifestyle Changes     Personal Nutrition Goals   Personal Goal #1 1-2 lb wt loss/week to a wt loss goal of 6-24 lb at graduation from cardiac rehab     Brush, educate and counsel regarding individualized specific dietary modifications aiming towards targeted core components such as weight, hypertension, lipid management, diabetes, heart failure and other comorbidities.   Expected Outcomes Short Term Goal: Understand basic principles of dietary content, such as calories, fat, sodium, cholesterol and nutrients.;Long Term Goal: Adherence to prescribed nutrition plan.      Nutrition Discharge: Nutrition Scores:     Nutrition Assessments - 11/07/15 1409      MEDFICTS Scores   Pre Score 52      Nutrition Goals Re-Evaluation:   Psychosocial: Target Goals: Acknowledge presence  or absence of depression, maximize coping skills, provide positive support system. Participant is able to verbalize types and ability to use techniques and skills needed for reducing stress and depression.  Initial Review & Psychosocial Screening:     Initial Psych Review & Screening - 11/06/15 [place]? Yes  pt has supportive, multiple church friends with strong faith base      Barriers   Psychosocial barriers to participate in program The patient should benefit from training in stress management and relaxation.     Screening Interventions   Interventions Encouraged to  exercise      Quality of Life Scores:     Quality of Life - 11/06/15 1644      Quality of Life Scores   Health/Function Pre 25.29 %   Socioeconomic Pre 28.29 %   Psych/Spiritual Pre 30 %   Family Pre 26.4 %   GLOBAL Pre 27.09 %      PHQ-9: Recent Review Flowsheet Data    There is no flowsheet data to display.      Psychosocial Evaluation and Intervention:   Psychosocial Re-Evaluation:   Vocational Rehabilitation: Provide vocational rehab assistance to qualifying candidates.   Vocational Rehab Evaluation & Intervention:     Vocational Rehab - 11/06/15 1511      Initial Vocational Rehab Evaluation & Intervention   Assessment shows need for Vocational Rehabilitation No      Education: Education Goals: Education classes will be provided on a weekly basis, covering required topics. Participant will state understanding/return demonstration of topics presented.  Learning Barriers/Preferences:     Learning Barriers/Preferences - 11/06/15 1514      Learning Barriers/Preferences   Learning Barriers Sight  Memory deficits   Learning Preferences Skilled Demonstration      Education Topics: Count Your Pulse:  -Group instruction provided by verbal instruction, demonstration, patient participation and written materials to support subject.  Instructors address importance of being able to find your pulse and how to count your pulse when at home without a heart monitor.  Patients get hands on experience counting their pulse with staff help and individually.   Heart Attack, Angina, and Risk Factor Modification:  -Group instruction provided by verbal instruction, video, and written materials to support subject.  Instructors address signs and symptoms of angina and heart attacks.    Also discuss risk factors for heart disease and how to make changes to improve heart health risk factors.   Functional Fitness:  -Group instruction provided by verbal instruction,  demonstration, patient participation, and written materials to support subject.  Instructors address safety measures for doing things around the house.  Discuss how to get up and down off the floor, how to pick things up properly, how to safely get out of a chair without assistance, and balance training.   Meditation and Mindfulness:  -Group instruction provided by verbal instruction, patient participation, and written materials to support subject.  Instructor addresses importance of mindfulness and meditation practice to help reduce stress and improve awareness.  Instructor also leads participants through a meditation exercise.    Stretching for Flexibility and Mobility:  -Group instruction provided by verbal instruction, patient participation, and written materials to support subject.  Instructors lead participants through series of stretches that are designed to increase flexibility thus improving mobility.  These stretches are additional exercise for major muscle groups that are typically performed during regular warm up and cool down.   Hands Only  CPR Anytime:  -Group instruction provided by verbal instruction, video, patient participation and written materials to support subject.  Instructors co-teach with AHA video for hands only CPR.  Participants get hands on experience with mannequins.   Nutrition I class: Heart Healthy Eating:  -Group instruction provided by PowerPoint slides, verbal discussion, and written materials to support subject matter. The instructor gives an explanation and review of the Therapeutic Lifestyle Changes diet recommendations, which includes a discussion on lipid goals, dietary fat, sodium, fiber, plant stanol/sterol esters, sugar, and the components of a well-balanced, healthy diet.   Nutrition II class: Lifestyle Skills:  -Group instruction provided by PowerPoint slides, verbal discussion, and written materials to support subject matter. The instructor gives an  explanation and review of label reading, grocery shopping for heart health, heart healthy recipe modifications, and ways to make healthier choices when eating out.   Diabetes Question & Answer:  -Group instruction provided by PowerPoint slides, verbal discussion, and written materials to support subject matter. The instructor gives an explanation and review of diabetes co-morbidities, pre- and post-prandial blood glucose goals, pre-exercise blood glucose goals, signs, symptoms, and treatment of hypoglycemia and hyperglycemia, and foot care basics.   Diabetes Blitz:  -Group instruction provided by PowerPoint slides, verbal discussion, and written materials to support subject matter. The instructor gives an explanation and review of the physiology behind type 1 and type 2 diabetes, diabetes medications and rational behind using different medications, pre- and post-prandial blood glucose recommendations and Hemoglobin A1c goals, diabetes diet, and exercise including blood glucose guidelines for exercising safely.    Portion Distortion:  -Group instruction provided by PowerPoint slides, verbal discussion, written materials, and food models to support subject matter. The instructor gives an explanation of serving size versus portion size, changes in portions sizes over the last 20 years, and what consists of a serving from each food group.   Stress Management:  -Group instruction provided by verbal instruction, video, and written materials to support subject matter.  Instructors review role of stress in heart disease and how to cope with stress positively.     Exercising on Your Own:  -Group instruction provided by verbal instruction, power point, and written materials to support subject.  Instructors discuss benefits of exercise, components of exercise, frequency and intensity of exercise, and end points for exercise.  Also discuss use of nitroglycerin and activating EMS.  Review options of places to  exercise outside of rehab.  Review guidelines for sex with heart disease.   Cardiac Drugs I:  -Group instruction provided by verbal instruction and written materials to support subject.  Instructor reviews cardiac drug classes: antiplatelets, anticoagulants, beta blockers, and statins.  Instructor discusses reasons, side effects, and lifestyle considerations for each drug class.   Cardiac Drugs II:  -Group instruction provided by verbal instruction and written materials to support subject.  Instructor reviews cardiac drug classes: angiotensin converting enzyme inhibitors (ACE-I), angiotensin II receptor blockers (ARBs), nitrates, and calcium channel blockers.  Instructor discusses reasons, side effects, and lifestyle considerations for each drug class.   Anatomy and Physiology of the Circulatory System:  -Group instruction provided by verbal instruction, video, and written materials to support subject.  Reviews functional anatomy of heart, how it relates to various diagnoses, and what role the heart plays in the overall system.   Knowledge Questionnaire Score:     Knowledge Questionnaire Score - 11/06/15 1643      Knowledge Questionnaire Score   Pre Score 15/24  Core Components/Risk Factors/Patient Goals at Admission:     Personal Goals and Risk Factors at Admission - 11/06/15 1511      Core Components/Risk Factors/Patient Goals on Admission    Weight Management Weight Loss;Yes   Expected Outcomes Long Term: Adherence to nutrition and physical activity/exercise program aimed toward attainment of established weight goal;Short Term: Continue to assess and modify interventions until short term weight is achieved;Weight Loss: Understanding of general recommendations for a balanced deficit meal plan, which promotes 1-2 lb weight loss per week and includes a negative energy balance of [id number] kcal/d   Sedentary Yes   Intervention Provide advice, education, support and counseling  about physical activity/exercise needs.;Develop an individualized exercise prescription for aerobic and resistive training based on initial evaluation findings, risk stratification, comorbidities and participant's personal goals.   Expected Outcomes Achievement of increased cardiorespiratory fitness and enhanced flexibility, muscular endurance and strength shown through measurements of functional capacity and personal statement of participant.   Increase Strength and Stamina Yes   Intervention Provide advice, education, support and counseling about physical activity/exercise needs.;Develop an individualized exercise prescription for aerobic and resistive training based on initial evaluation findings, risk stratification, comorbidities and participant's personal goals.   Expected Outcomes Achievement of increased cardiorespiratory fitness and enhanced flexibility, muscular endurance and strength shown through measurements of functional capacity and personal statement of participant.   Diabetes Yes   Intervention Provide education about signs/symptoms and action to take for hypo/hyperglycemia.;Provide education about proper nutrition, including hydration, and aerobic/resistive exercise prescription along with prescribed medications to achieve blood glucose in normal ranges: Fasting glucose 65-99 mg/dL   Expected Outcomes Short Term: Participant verbalizes understanding of the signs/symptoms and immediate care of hyper/hypoglycemia, proper foot care and importance of medication, aerobic/resistive exercise and nutrition plan for blood glucose control.;Long Term: Attainment of HbA1C < 7%.   Personal Goal Other Yes   Personal Goal Build strength in legs. Be able to start lawn care business. Be a motivation to others.   Intervention Provide advicce and education regarding proper aerobic and resistance training exercise to build strength and stamina to do desired work and leisure activites.   Expected Outcomes  Achievement of increased leg strength and overall strength and stamina to do work and leisure activities.      Core Components/Risk Factors/Patient Goals Review:    Core Components/Risk Factors/Patient Goals at Discharge (Final Review):    ITP Comments:     ITP Comments    Row Name 11/06/15 1349           ITP Comments Dr Fransico Him Medical Director          Comments: Ilona Sorrel attended orientation from 1300 to 1530 to review rules and guidelines for program. Completed 6 minute walk test, Intitial ITP, and exercise prescription.  VSS. Telemetry-Sinus rhythm with arrythmia.  Shotaro used his cane during the walk and complained of some chronic mild right hip pain during the walk test. No other complaints voiced.Will continue to monitor the patient throughout  the program.Zenola Dezarn Venetia Maxon, RN,BSN 11/09/2015 5:52 PM

## 2015-11-12 ENCOUNTER — Other Ambulatory Visit: Payer: Self-pay | Admitting: Internal Medicine

## 2015-11-12 ENCOUNTER — Encounter (HOSPITAL_COMMUNITY): Payer: Self-pay

## 2015-11-12 ENCOUNTER — Encounter (HOSPITAL_COMMUNITY)
Admission: RE | Admit: 2015-11-12 | Discharge: 2015-11-12 | Disposition: A | Discharge status: Home / Self Care | Payer: Medicare Other | Source: Ambulatory Visit | Attending: Interventional Cardiology | Admitting: Interventional Cardiology

## 2015-11-12 ENCOUNTER — Encounter (HOSPITAL_COMMUNITY): Payer: Medicare Other

## 2015-11-12 DIAGNOSIS — I214 Non-ST elevation (NSTEMI) myocardial infarction: Secondary | ICD-10-CM

## 2015-11-12 DIAGNOSIS — Z955 Presence of coronary angioplasty implant and graft: Secondary | ICD-10-CM

## 2015-11-12 NOTE — Progress Notes (Signed)
Daily Session Note  Patient Details  Name: Thomas Mcclure MRN: [id number] Date of Birth: 1955/04/08 Referring Provider:   Flowsheet Row CARDIAC REHAB PHASE II ORIENTATION from 11/06/2015 in [place]  Referring Provider  [name]      Encounter Date: 11/12/2015  Check In:     Session Check In - 11/12/15 1420      Check-In   Location [facility]   Staff Present Seward Carol, MS, ACSM CEP, Exercise Physiologist;Marrion Finan, RN, Marga Melnick, RN, BSN;Amber Fair, MS, ACSM RCEP, Exercise Physiologist   Supervising physician immediately available to respond to emergencies [facility] immediately available   Physician(s) Dr Waldron Labs   Medication changes reported     No   Fall or balance concerns reported    No   Warm-up and Cool-down Performed as group-led instruction   Resistance Training Performed No   VAD Patient? No     Pain Assessment   Currently in Pain? No/denies      Capillary Blood Glucose: No results found for this or any previous visit (from the past 24 hour(s)).   Goals Met:  Exercise tolerated well  Goals Unmet:  Not Applicable  Comments: Pt started cardiac rehab today.  Pt tolerated light exercise without difficulty. VSS, telemetry-sinus rhythm, asymptomatic.  Medication list reconciled. Pt denies barriers to medication  compliance.  PSYCHOSOCIAL ASSESSMENT:  PHQ-0. Pt exhibits positive coping skills, hopeful outlook with supportive family. No psychosocial needs identified at this time, no psychosocial interventions necessary.    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 Pt oriented to exercise equipment and routine.    Understanding verbalized.    Dr. Fransico Him is Medical Director for Cardiac Rehab at [facility].

## 2015-11-14 ENCOUNTER — Encounter (HOSPITAL_COMMUNITY)
Admission: RE | Admit: 2015-11-14 | Discharge: 2015-11-14 | Disposition: A | Discharge status: Home / Self Care | Payer: Medicare Other | Source: Ambulatory Visit | Attending: Interventional Cardiology | Admitting: Interventional Cardiology

## 2015-11-14 ENCOUNTER — Encounter (HOSPITAL_COMMUNITY): Payer: Medicare Other

## 2015-11-14 DIAGNOSIS — I214 Non-ST elevation (NSTEMI) myocardial infarction: Secondary | ICD-10-CM

## 2015-11-14 DIAGNOSIS — Z955 Presence of coronary angioplasty implant and graft: Secondary | ICD-10-CM

## 2015-11-14 LAB — GLUCOSE, CAPILLARY: Glucose-Capillary: 160 mg/dL — ABNORMAL HIGH (ref 65–99)

## 2015-11-16 ENCOUNTER — Encounter (HOSPITAL_COMMUNITY): Payer: Medicare Other

## 2015-11-19 ENCOUNTER — Encounter (HOSPITAL_COMMUNITY): Payer: Medicare Other

## 2015-11-19 ENCOUNTER — Encounter (HOSPITAL_COMMUNITY)
Admission: RE | Admit: 2015-11-19 | Discharge: 2015-11-19 | Disposition: A | Discharge status: Home / Self Care | Payer: Medicare Other | Source: Ambulatory Visit | Attending: Interventional Cardiology | Admitting: Interventional Cardiology

## 2015-11-19 DIAGNOSIS — Z955 Presence of coronary angioplasty implant and graft: Secondary | ICD-10-CM

## 2015-11-19 DIAGNOSIS — I214 Non-ST elevation (NSTEMI) myocardial infarction: Secondary | ICD-10-CM

## 2015-11-19 LAB — GLUCOSE, CAPILLARY: Glucose-Capillary: 144 mg/dL — ABNORMAL HIGH (ref 65–99)

## 2015-11-19 NOTE — Progress Notes (Signed)
Reviewed home exercise with pt today.  Pt plans to walk for exercise, 3- 10 minute bouts,2-3x/week in addition to coming to cardiac rehab.  Reviewed THR, pulse, RPE, sign and symptoms, NTG use, and when to call 911 or MD.  Also discussed weather considerations and indoor options.  Pt voiced understanding.    [employer] ACSM RCEP

## 2015-11-21 ENCOUNTER — Encounter (HOSPITAL_COMMUNITY)
Admission: RE | Admit: 2015-11-21 | Discharge: 2015-11-21 | Disposition: A | Discharge status: Home / Self Care | Payer: Medicare Other | Source: Ambulatory Visit | Attending: Interventional Cardiology | Admitting: Interventional Cardiology

## 2015-11-21 ENCOUNTER — Encounter (HOSPITAL_COMMUNITY): Payer: Medicare Other

## 2015-11-21 DIAGNOSIS — I214 Non-ST elevation (NSTEMI) myocardial infarction: Secondary | ICD-10-CM

## 2015-11-21 DIAGNOSIS — Z955 Presence of coronary angioplasty implant and graft: Secondary | ICD-10-CM

## 2015-11-21 LAB — GLUCOSE, CAPILLARY
Glucose-Capillary: 109 mg/dL — ABNORMAL HIGH (ref 65–99)
Glucose-Capillary: 79 mg/dL (ref 65–99)
Glucose-Capillary: 103 mg/dL — ABNORMAL HIGH (ref 65–99)

## 2015-11-23 ENCOUNTER — Encounter (HOSPITAL_COMMUNITY): Payer: Medicare Other

## 2015-11-26 ENCOUNTER — Encounter (HOSPITAL_COMMUNITY): Payer: Medicare Other

## 2015-11-26 ENCOUNTER — Telehealth (HOSPITAL_COMMUNITY): Payer: Self-pay | Admitting: Internal Medicine

## 2015-11-28 ENCOUNTER — Encounter (HOSPITAL_COMMUNITY): Payer: Medicare Other

## 2015-11-30 ENCOUNTER — Encounter (HOSPITAL_COMMUNITY): Payer: Medicare Other

## 2015-12-03 ENCOUNTER — Encounter (HOSPITAL_COMMUNITY): Payer: Self-pay | Admitting: Emergency Medicine

## 2015-12-03 ENCOUNTER — Inpatient Hospital Stay (HOSPITAL_COMMUNITY)
Admission: EM | Admit: 2015-12-03 | Discharge: 2015-12-05 | DRG: 309 | Disposition: A | Discharge status: Home / Self Care | Payer: Medicare Other | Attending: Internal Medicine | Admitting: Internal Medicine

## 2015-12-03 ENCOUNTER — Other Ambulatory Visit: Payer: Self-pay

## 2015-12-03 ENCOUNTER — Encounter (HOSPITAL_COMMUNITY): Payer: Medicare Other

## 2015-12-03 ENCOUNTER — Emergency Department (HOSPITAL_COMMUNITY): Payer: Medicare Other

## 2015-12-03 ENCOUNTER — Encounter (HOSPITAL_COMMUNITY)
Admission: RE | Admit: 2015-12-03 | Discharge: 2015-12-03 | Disposition: A | Discharge status: Home / Self Care | Payer: Medicare Other | Source: Ambulatory Visit | Attending: Interventional Cardiology | Admitting: Interventional Cardiology

## 2015-12-03 DIAGNOSIS — R0609 Other forms of dyspnea: Secondary | ICD-10-CM | POA: Diagnosis present

## 2015-12-03 DIAGNOSIS — Z79899 Other long term (current) drug therapy: Secondary | ICD-10-CM | POA: Insufficient documentation

## 2015-12-03 DIAGNOSIS — G8929 Other chronic pain: Secondary | ICD-10-CM | POA: Diagnosis present

## 2015-12-03 DIAGNOSIS — E1159 Type 2 diabetes mellitus with other circulatory complications: Secondary | ICD-10-CM

## 2015-12-03 DIAGNOSIS — G629 Polyneuropathy, unspecified: Secondary | ICD-10-CM | POA: Diagnosis present

## 2015-12-03 DIAGNOSIS — I255 Ischemic cardiomyopathy: Secondary | ICD-10-CM | POA: Diagnosis present

## 2015-12-03 DIAGNOSIS — Z955 Presence of coronary angioplasty implant and graft: Secondary | ICD-10-CM

## 2015-12-03 DIAGNOSIS — I200 Unstable angina: Secondary | ICD-10-CM | POA: Diagnosis present

## 2015-12-03 DIAGNOSIS — Z87891 Personal history of nicotine dependence: Secondary | ICD-10-CM | POA: Insufficient documentation

## 2015-12-03 DIAGNOSIS — R001 Bradycardia, unspecified: Principal | ICD-10-CM | POA: Diagnosis present

## 2015-12-03 DIAGNOSIS — I5042 Chronic combined systolic (congestive) and diastolic (congestive) heart failure: Secondary | ICD-10-CM | POA: Diagnosis present

## 2015-12-03 DIAGNOSIS — I2510 Atherosclerotic heart disease of native coronary artery without angina pectoris: Secondary | ICD-10-CM | POA: Diagnosis present

## 2015-12-03 DIAGNOSIS — R55 Syncope and collapse: Secondary | ICD-10-CM | POA: Diagnosis present

## 2015-12-03 DIAGNOSIS — I25110 Atherosclerotic heart disease of native coronary artery with unstable angina pectoris: Secondary | ICD-10-CM | POA: Diagnosis not present

## 2015-12-03 DIAGNOSIS — R079 Chest pain, unspecified: Secondary | ICD-10-CM | POA: Diagnosis present

## 2015-12-03 DIAGNOSIS — R0789 Other chest pain: Secondary | ICD-10-CM | POA: Diagnosis present

## 2015-12-03 DIAGNOSIS — T447X5A Adverse effect of beta-adrenoreceptor antagonists, initial encounter: Secondary | ICD-10-CM | POA: Diagnosis present

## 2015-12-03 DIAGNOSIS — I110 Hypertensive heart disease with heart failure: Secondary | ICD-10-CM | POA: Diagnosis present

## 2015-12-03 DIAGNOSIS — R531 Weakness: Secondary | ICD-10-CM

## 2015-12-03 DIAGNOSIS — E785 Hyperlipidemia, unspecified: Secondary | ICD-10-CM | POA: Diagnosis present

## 2015-12-03 DIAGNOSIS — K219 Gastro-esophageal reflux disease without esophagitis: Secondary | ICD-10-CM | POA: Diagnosis present

## 2015-12-03 DIAGNOSIS — E119 Type 2 diabetes mellitus without complications: Secondary | ICD-10-CM | POA: Diagnosis present

## 2015-12-03 DIAGNOSIS — M549 Dorsalgia, unspecified: Secondary | ICD-10-CM | POA: Diagnosis present

## 2015-12-03 DIAGNOSIS — I252 Old myocardial infarction: Secondary | ICD-10-CM | POA: Diagnosis not present

## 2015-12-03 DIAGNOSIS — I253 Aneurysm of heart: Secondary | ICD-10-CM

## 2015-12-03 DIAGNOSIS — Z7984 Long term (current) use of oral hypoglycemic drugs: Secondary | ICD-10-CM

## 2015-12-03 DIAGNOSIS — Y92009 Unspecified place in unspecified non-institutional (private) residence as the place of occurrence of the external cause: Secondary | ICD-10-CM

## 2015-12-03 DIAGNOSIS — E876 Hypokalemia: Secondary | ICD-10-CM | POA: Diagnosis present

## 2015-12-03 DIAGNOSIS — I214 Non-ST elevation (NSTEMI) myocardial infarction: Secondary | ICD-10-CM

## 2015-12-03 DIAGNOSIS — I10 Essential (primary) hypertension: Secondary | ICD-10-CM

## 2015-12-03 DIAGNOSIS — Z7982 Long term (current) use of aspirin: Secondary | ICD-10-CM

## 2015-12-03 DIAGNOSIS — I213 ST elevation (STEMI) myocardial infarction of unspecified site: Secondary | ICD-10-CM | POA: Insufficient documentation

## 2015-12-03 DIAGNOSIS — I959 Hypotension, unspecified: Secondary | ICD-10-CM | POA: Diagnosis not present

## 2015-12-03 DIAGNOSIS — Z8249 Family history of ischemic heart disease and other diseases of the circulatory system: Secondary | ICD-10-CM

## 2015-12-03 LAB — I-STAT TROPONIN, ED: Troponin i, poc: 0.03 ng/mL (ref 0.00–0.08)

## 2015-12-03 LAB — BASIC METABOLIC PANEL
Anion gap: 9 (ref 5–15)
BUN: 9 mg/dL (ref 6–20)
CO2: 25 mmol/L (ref 22–32)
Calcium: 9.4 mg/dL (ref 8.9–10.3)
Chloride: 105 mmol/L (ref 101–111)
Creatinine, Ser: 1.01 mg/dL (ref 0.61–1.24)
GFR calc Af Amer: >60 mL/min (ref >60)
GFR calc non Af Amer: >60 mL/min (ref >60)
Glucose, Bld: 86 mg/dL (ref 65–99)
Potassium: 3.2 mmol/L — ABNORMAL LOW (ref 3.5–5.1)
Sodium: 139 mmol/L (ref 135–145)

## 2015-12-03 LAB — CK: Total CK: 178 U/L (ref 49–397)

## 2015-12-03 LAB — URINALYSIS, ROUTINE W REFLEX MICROSCOPIC
Bilirubin Urine: NEGATIVE
Glucose, UA: NEGATIVE mg/dL
Hgb urine dipstick: NEGATIVE
Ketones, ur: NEGATIVE mg/dL
Leukocytes, UA: NEGATIVE
Nitrite: NEGATIVE
Protein, ur: NEGATIVE mg/dL
Specific Gravity, Urine: 1.009 (ref 1.005–1.030)
pH: 6 (ref 5.0–8.0)

## 2015-12-03 LAB — CBC
HCT: 36.6 % — ABNORMAL LOW (ref 39.0–52.0)
Hemoglobin: 12.1 g/dL — ABNORMAL LOW (ref 13.0–17.0)
MCH: 27.3 pg (ref 26.0–34.0)
MCHC: 33.1 g/dL (ref 30.0–36.0)
MCV: 82.4 fL (ref 78.0–100.0)
Platelets: 230 10*3/uL (ref 150–400)
RBC: 4.44 MIL/uL (ref 4.22–5.81)
RDW: 17.2 % — ABNORMAL HIGH (ref 11.5–15.5)
WBC: 8.4 10*3/uL (ref 4.0–10.5)

## 2015-12-03 LAB — HEPATIC FUNCTION PANEL
ALT: 18 U/L (ref 17–63)
AST: 18 U/L (ref 15–41)
Albumin: 4 g/dL (ref 3.5–5.0)
Alkaline Phosphatase: 71 U/L (ref 38–126)
Bilirubin, Direct: <0.1 mg/dL — ABNORMAL LOW (ref 0.1–0.5)
Total Bilirubin: 0.5 mg/dL (ref 0.3–1.2)
Total Protein: 6.6 g/dL (ref 6.5–8.1)

## 2015-12-03 LAB — CBG MONITORING, ED: Glucose-Capillary: 96 mg/dL (ref 65–99)

## 2015-12-03 LAB — PROTIME-INR
INR: 0.99
Prothrombin Time: 13.1 seconds (ref 11.4–15.2)

## 2015-12-03 LAB — GLUCOSE, CAPILLARY
Glucose-Capillary: 84 mg/dL (ref 65–99)
Glucose-Capillary: 87 mg/dL (ref 65–99)

## 2015-12-03 LAB — APTT: aPTT: 30 seconds (ref 24–36)

## 2015-12-03 LAB — BRAIN NATRIURETIC PEPTIDE: B Natriuretic Peptide: 54.7 pg/mL (ref 0.0–100.0)

## 2015-12-03 LAB — D-DIMER, QUANTITATIVE (NOT AT ARMC): D-Dimer, Quant: 0.27 ug/mL-FEU (ref 0.00–0.50)

## 2015-12-03 LAB — TROPONIN I: Troponin I: 0.06 ng/mL (ref <0.03)

## 2015-12-03 MED ORDER — POTASSIUM CHLORIDE CRYS ER 20 MEQ PO TBCR
40.0000 meq | EXTENDED_RELEASE_TABLET | Freq: Once | ORAL | Status: AC
  Administered 2015-12-03: 40 meq via ORAL
  Filled 2015-12-03: qty 2

## 2015-12-03 MED ORDER — ASPIRIN 81 MG PO CHEW
324.0000 mg | CHEWABLE_TABLET | Freq: Once | ORAL | Status: AC
  Administered 2015-12-03: 324 mg via ORAL
  Filled 2015-12-03: qty 4

## 2015-12-03 MED ORDER — NITROGLYCERIN 0.4 MG SL SUBL
0.4000 mg | SUBLINGUAL_TABLET | SUBLINGUAL | Status: DC | PRN
  Administered 2015-12-03 (×2): 0.4 mg via SUBLINGUAL
  Filled 2015-12-03: qty 1

## 2015-12-03 MED ORDER — OXYCODONE-ACETAMINOPHEN 5-325 MG PO TABS
2.0000 | ORAL_TABLET | Freq: Once | ORAL | Status: AC
  Administered 2015-12-03: 2 via ORAL
  Filled 2015-12-03: qty 2

## 2015-12-03 MED ORDER — HEPARIN (PORCINE) IN NACL 100-0.45 UNIT/ML-% IJ SOLN
1600.0000 [IU]/h | INTRAMUSCULAR | Status: DC
  Administered 2015-12-04: 1600 [IU]/h via INTRAVENOUS
  Filled 2015-12-03 (×2): qty 250

## 2015-12-03 MED ORDER — MORPHINE SULFATE (PF) 2 MG/ML IV SOLN
2.0000 mg | Freq: Once | INTRAVENOUS | Status: AC
  Administered 2015-12-03: 2 mg via INTRAVENOUS
  Filled 2015-12-03: qty 1

## 2015-12-03 MED ORDER — NITROGLYCERIN IN D5W 200-5 MCG/ML-% IV SOLN
0.0000 ug/min | INTRAVENOUS | Status: DC
  Administered 2015-12-03: 5 ug/min via INTRAVENOUS
  Filled 2015-12-03: qty 250

## 2015-12-03 MED ORDER — NITROGLYCERIN 2 % TD OINT: 1.0000 [in_us] | TOPICAL_OINTMENT | Freq: Four times a day (QID) | TRANSDERMAL | Status: DC

## 2015-12-03 MED ORDER — HEPARIN BOLUS VIA INFUSION
4000.0000 [IU] | Freq: Once | INTRAVENOUS | Status: DC
  Filled 2015-12-03: qty 4000

## 2015-12-03 NOTE — ED Notes (Signed)
Advised MD of active chest pain 6/10 with no relief from Morphine or Nitro SL

## 2015-12-03 NOTE — ED Notes (Signed)
CK reacquisition sent to [facility] in lab.

## 2015-12-03 NOTE — ED Provider Notes (Signed)
[facility] DEPT Provider Note   CSN: [id number] Arrival date & time: 12/03/15  1429     History   Chief Complaint Chief Complaint  Patient presents with  . Near Syncope  . Chest Pain  . Shortness of Breath    HPI ANYELO Mcclure is a 60 y.o. male.  HPI  At Cardiac rehab felt very weak all over, lightheadedness Breathing problems for 3 weeks, waking up in the middle of the night short of breath Left sided chest pain, no radiation Dyspnea over last several weeks, can happen even when resting in his chair,  Chest No abd pain Diarrhea all weekend, no blood Hx of right leg pain in past as well, sciatic pain Mole removed on Saturday, wouldn't stop bleeding Reports 5lb weight gain in 1 week RLE pain, feels like a tingling, has had for long time, worsened. Nerve stimulator in back. Still having back pain. No loss control bowel/bladder.  Past Medical History:  Diagnosis Date  . Arthritis    Rheumatoid  . Baker's cyst    Left calf  . CAD in native artery, 07/15/15 PCI of RCA with DES 07/16/2015   a.   NSTEMI 5/17: LHC - pLAD 20, pLCx 20, OM1 30, pRCA 100, EF 45-50%>> PCI: 2.5 x 24 mm Promus DES to RCA  //  b.   Echo 5/17: mild LVH, EF 50-55%, no RWMA, mod RVE //  c. LHC 6/17: pLAD 20, pLCx 20, OM1 50, pRCA stent ok, EF 35-45% with mod sized inf wall and basal segment aneurysm  . Chronic back pain   . Cyst (solitary) of breast    ? cyst left calf ,knee  . Diabetes mellitus    diet controlled  . GERD (gastroesophageal reflux disease)   . Hyperlipidemia   . Hypertension    Dr. [name] [phone]  . Ischemic cardiomyopathy    a. LV-gram at time of LHC in 6/17 with EF 35-45%  //  b. Echo 7/17: EF 45-50%, inferior HK, grade 1 diastolic dysfunction, mildly dilated aortic root, moderately reduced RVSF, mild RAE  . Myocardial infarction 2017  . 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 Chest pain  12/03/2015  . Left ventricular aneurysm 12/03/2015  . Ischemic cardiomyopathy   . Fatigue 08/24/2015  . Pain in the chest   . Unstable angina ([place]) 08/06/2015  . CAD (coronary artery disease) 07/16/2015  . Hypokalemia 07/16/2015  . History of non-ST elevation myocardial infarction (NSTEMI) 07/15/2015  . Hypertension 07/15/2015  . Hyperlipidemia 07/15/2015  . Chronic back pain 07/15/2015  . Diabetes mellitus ([place]) 07/15/2015    Past Surgical History:  Procedure Laterality Date  . CARDIAC CATHETERIZATION N/A 07/15/2015   Procedure: Left Heart Cath and Coronary Angiography;  Surgeon: [name], MD;  Location: [place] [facility];  Service: Cardiovascular;  Laterality: N/A;  . CARDIAC CATHETERIZATION N/A 07/15/2015   Procedure: Coronary Stent Intervention;  Surgeon: [name], MD;  Location: [place] [facility];  Service: Cardiovascular;  Laterality: N/A;  . CARDIAC CATHETERIZATION N/A 08/07/2015   Procedure: Left Heart Cath and Coronary Angiography;  Surgeon: [name], MD;  Location: [place] [facility];  Service: Cardiovascular;  Laterality: N/A;  . CORONARY STENT PLACEMENT  07/2015  . KNEE SURGERY    . LUMBAR SPINE SURGERY  03/2009  & 2012  . MASS EXCISION N/A 04/19/2012   Procedure: removal of  posterior cervical lipoma;  Surgeon: [name], MD;  Location: [facility];  Service: Neurosurgery;  Laterality: N/A;  Removal of posterior cervical lipoma  . SPINAL CORD STIMULATOR INSERTION N/A 01/07/2013   Procedure:  SPINAL CORD STIMULATOR INSERTION;  Surgeon: [name], MD;  Location: [facility];  Service: Neurosurgery;  Laterality: N/A;  . WISDOM TOOTH EXTRACTION     hx of       Home Medications    Prior to Admission medications   Medication Sig Start Date End Date Taking? Authorizing Provider  aspirin EC 81 MG tablet Take 1 tablet (81 mg total) by mouth daily. 07/18/15  Yes Alexa Angela Burke, MD  atorvastatin (LIPITOR) 80 MG tablet Take 1 tablet (80 mg total) by  mouth daily at 6 PM. 07/18/15  Yes Alexa Angela Burke, MD  chlorthalidone (HYGROTON) 25 MG tablet Take 25 mg by mouth daily. 10/11/15  Yes Historical Provider, MD  cyclobenzaprine (FLEXERIL) 10 MG tablet Take 10 mg by mouth 3 (three) times daily as needed for muscle spasms.    Yes Historical Provider, MD  diazepam (VALIUM) 10 MG tablet Take 10 mg by mouth 2 (two) times daily. For spasms   Yes Historical Provider, MD  diltiazem (DILACOR XR) 180 MG 24 hr capsule Take 180 mg by mouth daily. 10/15/15  Yes Historical Provider, MD  DULoxetine (CYMBALTA) 60 MG capsule Take 60 mg by mouth daily.   Yes Historical Provider, MD  furosemide (LASIX) 40 MG tablet Take 1 tablet (40 mg total) by mouth daily. 08/08/15  Yes Bhavinkumar Bhagat, PA  lisinopril (PRINIVIL,ZESTRIL) 20 MG tablet Take 20 mg by mouth at bedtime.    Yes Historical Provider, MD  metFORMIN (GLUCOPHAGE-XR) 750 MG 24 hr tablet Take 750 mg by mouth daily with supper. 06/07/15  Yes Historical Provider, MD  metoprolol tartrate (LOPRESSOR) 25 MG tablet Take 1 tablet (25 mg total) by mouth 2 (two) times daily. 09/07/15  Yes Liliane Shi, PA-C  Multiple Vitamin (MULTIVITAMIN WITH MINERALS) TABS tablet Take 1 tablet by mouth daily.   Yes Historical Provider, MD  nitroGLYCERIN (NITROSTAT) 0.4 MG SL tablet Place 1 tablet (0.4 mg total) under the tongue every 5 (five) minutes as needed for chest pain. 07/18/15  Yes Scott Joylene Draft, PA-C  omeprazole (PRILOSEC) 40 MG capsule Take 40 mg by mouth daily.   Yes Historical Provider, MD  Oxycodone HCl 10 MG TABS Take 10 mg by mouth every 4 (four) hours as needed (pain).    Yes Historical Provider, MD  polyethylene glycol powder (GLYCOLAX/MIRALAX) powder Take 17 g by mouth daily as needed (constipation). Mix in 8 oz water, juice, soda, coffee or tea and drink 06/07/15  Yes Historical Provider, MD  potassium chloride SA (K-DUR,KLOR-CON) 20 MEQ tablet Take 20 mEq by mouth 2 (two) times daily.   Yes Historical Provider, MD    pregabalin (LYRICA) 100 MG capsule Take 100 mg by mouth 3 (three) times daily.    Yes Historical Provider, MD  ticagrelor (BRILINTA) 90 MG TABS tablet Take 1 tablet (90 mg total) by mouth 2 (two) times daily. 09/17/15  Yes [name], MD  topiramate (TOPAMAX) 50 MG tablet Take 50 mg by mouth 2 (two) times daily as needed (nerve pain).    Yes Historical Provider, MD  traMADol (ULTRAM) 50 MG tablet Take 100 mg by mouth 2 (two) times daily.    Yes Historical Provider, MD  feeding supplement, ENSURE ENLIVE, (ENSURE ENLIVE) LIQD Take 237 mLs  by mouth 2 (two) times daily between meals. Patient not taking: Reported on 12/03/2015 08/26/15   Cristal Ford, DO    Family History Family History  Problem Relation Age of Onset  . Heart disease Mother   . Prostate cancer Brother     Social History Social History  Substance Use Topics  . Smoking status: Former Smoker    Years: 15.00    Types: Cigarettes  . Smokeless tobacco: Former Systems developer    Quit date: 04/08/2015     Comment: 5 cig a week  . Alcohol use 0.0 oz/week     Comment: Couple 40 ounces daily until 2012     Allergies   Toradol [ketorolac tromethamine]; Methylprednisolone; Prednisone; Adhesive [tape]; and Other   Review of Systems Review of Systems  Constitutional: Positive for fatigue. Negative for fever.  HENT: Negative for sore throat.   Eyes: Negative for visual disturbance.  Respiratory: Positive for shortness of breath.   Cardiovascular: Positive for chest pain.  Gastrointestinal: Positive for nausea. Negative for abdominal pain and vomiting.  Genitourinary: Negative for difficulty urinating.  Musculoskeletal: Positive for myalgias. Negative for back pain and neck stiffness.  Skin: Negative for rash.  Neurological: Positive for weakness (generalized) and light-headedness. Negative for syncope and headaches.     Physical Exam Updated Vital Signs BP 111/85   Pulse 86   Temp 97.8 F (36.6 C) (Oral)   Resp (!) 9   Ht  5\' 10"  (1.778 m)   Wt 219 lb 12.8 oz (99.7 kg)   SpO2 97%   BMI 31.54 kg/m   Physical Exam  Constitutional: He is oriented to person, place, and time. He appears well-developed and well-nourished. He appears ill. No distress.  Appears fatigued, diaphoretic  HENT:  Head: Normocephalic and atraumatic.  Eyes: Conjunctivae and EOM are normal.  Neck: Normal range of motion.  Cardiovascular: Normal rate, regular rhythm, normal heart sounds and intact distal pulses.  Exam reveals no gallop and no friction rub.   No murmur heard. Pulmonary/Chest: Effort normal and breath sounds normal. No respiratory distress. He has no wheezes. He has no rales.  Abdominal: Soft. He exhibits no distension. There is no tenderness. There is no guarding.  Musculoskeletal: He exhibits no edema.  Neurological: He is alert and oriented to person, place, and time.  Good strength bilateral lower extremities, reports pain and pain in back with movement  Skin: Skin is warm and dry. He is not diaphoretic.  Nursing note and vitals reviewed.    ED Treatments / Results  Labs (all labs ordered are listed, but only abnormal results are displayed) Labs Reviewed  BASIC METABOLIC PANEL - Abnormal; Notable for the following:       Result Value   Potassium 3.2 (*)    All other components within normal limits  CBC - Abnormal; Notable for the following:    Hemoglobin 12.1 (*)    HCT 36.6 (*)    RDW 17.2 (*)    All other components within normal limits  HEPATIC FUNCTION PANEL - Abnormal; Notable for the following:    Bilirubin, Direct <0.1 (*)    All other components within normal limits  TROPONIN I - Abnormal; Notable for the following:    Troponin I 0.06 (*)    All other components within normal limits  HEPARIN LEVEL (UNFRACTIONATED) - Abnormal; Notable for the following:    Heparin Unfractionated 0.15 (*)    All other components within normal limits  CBC - Abnormal; Notable for the  following:    Hemoglobin 11.8  (*)    HCT 36.3 (*)    RDW 17.2 (*)    All other components within normal limits  TROPONIN I - Abnormal; Notable for the following:    Troponin I 0.05 (*)    All other components within normal limits  TROPONIN I - Abnormal; Notable for the following:    Troponin I 0.06 (*)    All other components within normal limits  TROPONIN I - Abnormal; Notable for the following:    Troponin I 0.06 (*)    All other components within normal limits  TROPONIN I - Abnormal; Notable for the following:    Troponin I 0.06 (*)    All other components within normal limits  CBC - Abnormal; Notable for the following:    Hemoglobin 12.1 (*)    HCT 37.4 (*)    RDW 17.1 (*)    All other components within normal limits  BASIC METABOLIC PANEL - Abnormal; Notable for the following:    Potassium 3.1 (*)    Glucose, Bld 105 (*)    BUN <5 (*)    All other components within normal limits  RAPID URINE DRUG SCREEN, HOSP PERFORMED - Abnormal; Notable for the following:    Opiates POSITIVE (*)    Benzodiazepines POSITIVE (*)    All other components within normal limits  GLUCOSE, CAPILLARY - Abnormal; Notable for the following:    Glucose-Capillary 107 (*)    All other components within normal limits  MRSA PCR SCREENING  URINALYSIS, ROUTINE W REFLEX MICROSCOPIC (NOT AT [facility])  BRAIN NATRIURETIC PEPTIDE  CK  PROTIME-INR  APTT  D-DIMER, QUANTITATIVE (NOT AT [facility])  GLUCOSE, CAPILLARY  HEPARIN LEVEL (UNFRACTIONATED)  HEMOGLOBIN A1C  CBG MONITORING, ED  I-STAT TROPOININ, ED    EKG  EKG Interpretation  Date/Time:  Monday December 03 2015 14:33:35 EDT Ventricular Rate:  77 PR Interval:  170 QRS Duration: 96 QT Interval:  402 QTC Calculation: 454 R Axis:   41 Text Interpretation:  Sinus rhythm with occasional Premature ventricular complexes Otherwise normal ECG PVCs are new compared to prior Confirmed by [facility] MD, Tniyah Nakagawa ([id number]) on 12/03/2015 3:12:15 PM       Radiology Dg Chest 2 View  Result Date:  12/03/2015 CLINICAL DATA:  Weakness and fatigue.  Chest pain and dyspnea. EXAM: CHEST  2 VIEW COMPARISON:  09/12/2015; 07/15/2015; chest CT - 07/14/2025 FINDINGS: Grossly unchanged cardiac silhouette and mediastinal contours. Evaluation of the retrosternal clear space obscured secondary overlying soft tissues. No focal airspace opacities. No pleural effusion or pneumothorax. No evidence of edema. Spinal stimulator leads are seen within the mid thoracic spinal canal. Post lumbar paraspinal fusion, incompletely evaluated. IMPRESSION: No acute cardiopulmonary disease. Electronically Signed   By: Sandi Mariscal M.D.   On: 12/03/2015 16:02   Ct Head Wo Contrast  Result Date: 12/04/2015 CLINICAL DATA:  60 y/o  M; weakness with onset yesterday. EXAM: CT HEAD WITHOUT CONTRAST TECHNIQUE: Contiguous axial images were obtained from the base of the skull through the vertex without intravenous contrast. COMPARISON:  08/24/2015 CT of the head. FINDINGS: Brain: No evidence of acute infarction, hemorrhage, hydrocephalus, extra-axial collection or mass lesion/mass effect. Vascular: No hyperdense vessel or unexpected calcification. Skull: Normal. Negative for fracture or focal lesion. Sinuses/Orbits: Mild diffuse paranasal sinus mucosal thickening. Orbits are unremarkable. Other: None. IMPRESSION: 1. No acute intracranial abnormality is identified. 2. Mild paranasal sinus disease. 3. Otherwise unremarkable CT of head for age. Electronically Signed  By: Kristine Garbe M.D.   On: 12/04/2015 01:44   Ct Angio Chest Aorta W/cm &/or Wo/cm  Result Date: 12/04/2015 CLINICAL DATA:  Chest pain and shortness of breath. EXAM: CT ANGIOGRAPHY CHEST WITH CONTRAST TECHNIQUE: Multidetector CT imaging of the chest was performed using the standard protocol during bolus administration of intravenous contrast. Multiplanar CT image reconstructions and MIPs were obtained to evaluate the vascular anatomy. CONTRAST:  100 cc Isovue 370 IV  COMPARISON:  Radiographs yesterday.  Chest CT 07/15/2015 FINDINGS: Cardiovascular: Preferential opacification of the thoracic aorta. No evidence of thoracic aortic aneurysm or dissection. No filling defects in the pulmonary arteries to the distal lobar level. Normal heart size. No pericardial effusion, physiologic fluid within the pericardial recess. Coronary artery calcifications or stents. Mediastinum/Nodes: Small mediastinal nodes are not enlarged by size criteria. No hilar adenopathy. The esophagus is decompressed. Thyroid gland is normal. Lungs/Pleura: Mild apical predominant emphysema. Diminished bronchial thickening from prior. Streaky lower lobe opacities consistent with atelectasis. Tiny subpleural nodule in the right middle lobe image 62 series 7 is unchanged. No pleural fluid. Upper Abdomen: No acute abnormality. Musculoskeletal: Spinal stimulator in place with mild degenerative change in the spine. There are no acute or suspicious osseous abnormalities. Review of the MIP images confirms the above findings. IMPRESSION: 1. No aortic dissection or acute vascular abnormality. Coronary artery calcifications. 2. Mild emphysema.  Streaky lower lobe atelectasis. Electronically Signed   By: Jeb Levering M.D.   On: 12/04/2015 01:49    Procedures Procedures (including critical care time)  Medications Ordered in ED Medications  chlorthalidone (HYGROTON) tablet 25 mg (25 mg Oral Given 12/04/15 0933)  oxyCODONE (Oxy IR/ROXICODONE) immediate release tablet 10 mg (10 mg Oral Given 12/04/15 0859)  potassium chloride SA (K-DUR,KLOR-CON) CR tablet 20 mEq (20 mEq Oral Given 12/04/15 0933)  ticagrelor (BRILINTA) tablet 90 mg (90 mg Oral Given 12/04/15 0933)  lisinopril (PRINIVIL,ZESTRIL) tablet 20 mg (20 mg Oral Not Given 12/04/15 0115)  metoprolol tartrate (LOPRESSOR) tablet 25 mg (25 mg Oral Given 12/04/15 0933)  furosemide (LASIX) tablet 40 mg (40 mg Oral Given 12/04/15 0933)  aspirin EC tablet 81 mg  (81 mg Oral Given 12/04/15 0933)  atorvastatin (LIPITOR) tablet 80 mg (not administered)  diazepam (VALIUM) tablet 10 mg (10 mg Oral Given 12/04/15 0933)  traMADol (ULTRAM) tablet 100 mg (100 mg Oral Given 12/04/15 0934)  DULoxetine (CYMBALTA) DR capsule 60 mg (60 mg Oral Given 12/04/15 0934)  pantoprazole (PROTONIX) EC tablet 40 mg (40 mg Oral Given 12/04/15 0933)  pregabalin (LYRICA) capsule 100 mg (100 mg Oral Given 12/04/15 0932)  topiramate (TOPAMAX) tablet 50 mg (not administered)  nitroGLYCERIN (NITROSTAT) SL tablet 0.4 mg (not administered)  acetaminophen (TYLENOL) tablet 650 mg (not administered)  ondansetron (ZOFRAN) injection 4 mg (not administered)  insulin aspart (novoLOG) injection 0-15 Units (0 Units Subcutaneous Not Given 12/04/15 0840)  nitroGLYCERIN 50 mg in dextrose 5 % 250 mL (0.2 mg/mL) infusion (20 mcg/min Intravenous Rate/Dose Change 12/04/15 0045)  heparin bolus via infusion 4,000 Units ( Intravenous Not Given 12/03/15 2245)  heparin ADULT infusion 100 units/mL (25000 units/264mL sodium chloride 0.45%) (1,600 Units/hr Intravenous New Bag/Given 12/04/15 0241)  morphine 2 MG/ML injection 2 mg (2 mg Intravenous Given 12/04/15 0129)  iopamidol (ISOVUE-370) 76 % injection (not administered)  diltiazem (CARDIZEM CD) 24 hr capsule 180 mg (180 mg Oral Given 12/04/15 0933)  aspirin chewable tablet 324 mg (324 mg Oral Given 12/03/15 1620)  oxyCODONE-acetaminophen (PERCOCET/ROXICET) 5-325 MG per tablet 2 tablet (2  tablets Oral Given 12/03/15 1620)  morphine 2 MG/ML injection 2 mg (2 mg Intravenous Given 12/03/15 1923)  potassium chloride SA (K-DUR,KLOR-CON) CR tablet 40 mEq (40 mEq Oral Given 12/03/15 2228)     Initial Impression / Assessment and Plan / ED Course  I have reviewed the triage vital signs and the nursing notes.  Pertinent labs & imaging results that were available during my care of the patient were reviewed by me and considered in my medical decision making (see  chart for details).  Clinical Course   61yo male with history of DM, CAD, htn, hlpd, ischemic cardiomyopathy, NSTEMI 06/2015 with catheterization again 07/2015 without significant obstruction presents from cardiac rehab for concern for episode of ligtheadedness, shortness of breath and chest pain. Patient reports increasing frequencing of episodes of chest pain/dyspnea over last 3 weeks.  By hx, describes orthopnea and DOE however do not see clear signs of CHF on exam, labwork or XR.  Reports right leg pain, which is described as tingling and has been present chronically in association with back pain/has nerve stimulator in place.  Do not see asymmetric swelling and given chronicity of this pain and quality feel it is more likely secondary to spine pathology/sciatica than DVT. Low suspicion for PE given pt on brilinta, no other risk factors, no hypoxia, no tachycardia. Pt with good bilateral pulses, intermittent symptoms over weeks, XR without widening, and doubt dissection.  Initial troponin negative. Discussed with Cardiology. Will admit for continued observation to hospitalist service.  Final Clinical Impressions(s) / ED Diagnoses   Final diagnoses:  Chest pain  Near syncope  Dyspnea on exertion    New Prescriptions Current Discharge Medication List       Gareth Morgan, MD 12/04/15 1146

## 2015-12-03 NOTE — H&P (Signed)
History and Physical    Thomas Mcclure [id number] DOB: 1955/04/13 DOA: 12/03/2015  PCP: [name], MD   Patient coming from: Cardiac Rehab  Chief Complaint: Chest pain and shortness of breath  HPI: Thomas Mcclure is a 60 y.o. gentleman with a history of CAD S/P NSTEMI in May (S/P DES to the RCA), ischemic cardiomyopathy (EF 45-50% with grade I diastolic dysfunction on echo in July), HTN, HLD, and DM who has had recurrent chest pain and shortness of breath since his NSTEMI in May.  He was actually re-admitted in June and had repeat left heart cath at that time.  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  He was admitted again at the beginning of July with complaints of fatigue and was diagnosed with dehydration, deconditioning S/P MI, and hypokalemia.  He was in the ED a second time in July for chest pain; he was evaluated by cardiology in the ED but was not admitted.  He has been participating in cardiac rehab.  Today, he had a near syncopal episode while walking around the track.  He reports left sided chest pain, dyspnea on exertion, nausea but no vomiting, and generalized weakness.  He was referred to the ED for evaluation.  He reports progressive DOE for the past two weeks when walking in his driveway.  He reports that he has also been awakening at night with acute shortness of breath and gasping for air.  This is also new over the past 1-2 weeks.  He has had night sweats.  No fevers.  No recent antibiotics in the past 30 days.  He admits to increased stress as it relates to family matters.  He reports that he has taken his aspirin and Brilinta as prescribed, without fail.  ED Course: EKG negative for acute ST segment changes (including repeat EKG taken while the patient was complaining of active chest pain).  PVCs present on initial EKG.  Chest xray negative for acute cardiopulmonary process.  I-stat troponin 0.03.  Potassium 3.2.  The patient  is still complaining of left pectoral chest pain radiating to his scapula and into his shoulder, even after oral percocet, IV morphine, SL nitroglycerin, and aspirin 324mg .  Repeat troponin, D-Dimer, and coags pending.    The patient will be admitted to the stepdown unit due to active chest pain.  Review of Systems: Worsening bilateral lower extremity neuropathic pain (which he has had since his initial back surgery 7 years ago; he has a spine stimulator and is on multiple pain medications at baseline).  He is still able to walk.  He denies urinary retention.  He has had diarrhea.  Skin lesion removed from right cheek on Saturday; he has had recurrent bleeding at that site.  Otherwise, 10 systems reviewed and negative except as stated in the HPI.      Past Medical History:  Diagnosis Date  . Arthritis    Rheumatoid  . Baker's cyst    Left calf  . CAD in native artery, 07/15/15 PCI of RCA with DES 07/16/2015   a.   NSTEMI 5/17: LHC - pLAD 20, pLCx 20, OM1 30, pRCA 100, EF 45-50%>> PCI: 2.5 x 24 mm Promus DES to RCA  //  b.   Echo 5/17: mild LVH, EF 50-55%, no RWMA, mod RVE //  c. LHC 6/17: pLAD 20, pLCx 20, OM1 50, pRCA stent ok, EF 35-45% with mod sized inf wall and basal segment aneurysm  .  Chronic back pain   . Cyst (solitary) of breast    ? cyst left calf ,knee  . Diabetes mellitus    diet controlled  . GERD (gastroesophageal reflux disease)   . Hyperlipidemia   . Hypertension    Dr. [name] [phone]  . Ischemic cardiomyopathy    a. LV-gram at time of LHC in 6/17 with EF 35-45%  //  b. Echo 7/17: EF 45-50%, inferior HK, grade 1 diastolic dysfunction, mildly dilated aortic root, moderately reduced RVSF, mild RAE  . Myocardial infarction 2017  . 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 CARDIAC CATHETERIZATION N/A 07/15/2015   Procedure: Left Heart Cath and Coronary Angiography;  Surgeon: [name], MD;   Location: [facility];  Service: Cardiovascular;  Laterality: N/A;  . CARDIAC CATHETERIZATION N/A 07/15/2015   Procedure: Coronary Stent Intervention;  Surgeon: [name], MD;  Location: [facility] [facility];  Service: Cardiovascular;  Laterality: N/A;  . CARDIAC CATHETERIZATION N/A 08/07/2015   Procedure: Left Heart Cath and Coronary Angiography;  Surgeon: [name], MD;  Location: [facility];  Service: Cardiovascular;  Laterality: N/A;  . CORONARY STENT PLACEMENT  07/2015  . KNEE SURGERY    . LUMBAR SPINE SURGERY  03/2009  & 2012  . MASS EXCISION N/A 04/19/2012   Procedure: removal of posterior cervical lipoma;  Surgeon: [name], MD;  Location: [facility];  Service: Neurosurgery;  Laterality: N/A;  Removal of posterior cervical lipoma  . SPINAL CORD STIMULATOR INSERTION N/A 01/07/2013   Procedure:  SPINAL CORD STIMULATOR INSERTION;  Surgeon: [name], MD;  Location: [facility];  Service: Neurosurgery;  Laterality: N/A;  . WISDOM TOOTH EXTRACTION     hx of  THREE lumbar surgeries in the past 7 years   reports that he has quit smoking. His smoking use included Cigarettes. He quit after 15.00 years of use. He quit smokeless tobacco use about 7 months ago. He reports that he drinks alcohol. He reports that he does not use drugs.  Former tobacco use; he denies EtOH or illicit drug use.  He is married.  He has 2 biological children and three step children.  He draws disability.  Allergies  Allergen Reactions  . Toradol [Ketorolac Tromethamine] Anaphylaxis    bp dropped, diaphoresis  . Methylprednisolone Other (See Comments)    B/P dropped and diorphoresis  . Prednisone Other (See Comments)    B/P dropped and patient began to sweat  . Adhesive [Tape] Rash and Other (See Comments)    "blisters" ( NO SURGICAL TAPE, PLEASE)  . Other Other (See Comments)    Allergic reaction to steroid (Prednisone??) Unsure of name B/P dropped and patient began to sweat     Family History  Problem Relation Age of Onset  . Heart disease Mother   . Prostate cancer Brother      Prior to Admission medications   Medication Sig Start Date End Date Taking? Authorizing Provider  aspirin EC 81 MG tablet Take 1 tablet (81 mg total) by mouth daily. 07/18/15  Yes Alexa Angela Burke, MD  atorvastatin (LIPITOR) 80 MG tablet Take 1 tablet (80 mg total) by mouth daily at 6 PM. 07/18/15  Yes Alexa Angela Burke, MD  chlorthalidone (HYGROTON) 25 MG tablet Take 25 mg by mouth daily. 10/11/15  Yes Historical Provider, MD  cyclobenzaprine (FLEXERIL) 10 MG tablet Take  10 mg by mouth 3 (three) times daily as needed for muscle spasms.    Yes Historical Provider, MD  diazepam (VALIUM) 10 MG tablet Take 10 mg by mouth 2 (two) times daily. For spasms   Yes Historical Provider, MD  diltiazem (DILACOR XR) 180 MG 24 hr capsule Take 180 mg by mouth daily. 10/15/15  Yes Historical Provider, MD  DULoxetine (CYMBALTA) 60 MG capsule Take 60 mg by mouth daily.   Yes Historical Provider, MD  furosemide (LASIX) 40 MG tablet Take 1 tablet (40 mg total) by mouth daily. 08/08/15  Yes Bhavinkumar Bhagat, PA  lisinopril (PRINIVIL,ZESTRIL) 20 MG tablet Take 20 mg by mouth at bedtime.    Yes Historical Provider, MD  metFORMIN (GLUCOPHAGE-XR) 750 MG 24 hr tablet Take 750 mg by mouth daily with supper. 06/07/15  Yes Historical Provider, MD  metoprolol tartrate (LOPRESSOR) 25 MG tablet Take 1 tablet (25 mg total) by mouth 2 (two) times daily. 09/07/15  Yes Liliane Shi, PA-C  Multiple Vitamin (MULTIVITAMIN WITH MINERALS) TABS tablet Take 1 tablet by mouth daily.   Yes Historical Provider, MD  nitroGLYCERIN (NITROSTAT) 0.4 MG SL tablet Place 1 tablet (0.4 mg total) under the tongue every 5 (five) minutes as needed for chest pain. 07/18/15  Yes Scott Joylene Draft, PA-C  omeprazole (PRILOSEC) 40 MG capsule Take 40 mg by mouth daily.   Yes Historical Provider, MD  Oxycodone HCl 10 MG TABS Take 10 mg by mouth every 4 (four) hours  as needed (pain).    Yes Historical Provider, MD  polyethylene glycol powder (GLYCOLAX/MIRALAX) powder Take 17 g by mouth daily as needed (constipation). Mix in 8 oz water, juice, soda, coffee or tea and drink 06/07/15  Yes Historical Provider, MD  potassium chloride SA (K-DUR,KLOR-CON) 20 MEQ tablet Take 20 mEq by mouth 2 (two) times daily.   Yes Historical Provider, MD  pregabalin (LYRICA) 100 MG capsule Take 100 mg by mouth 3 (three) times daily.    Yes Historical Provider, MD  ticagrelor (BRILINTA) 90 MG TABS tablet Take 1 tablet (90 mg total) by mouth 2 (two) times daily. 09/17/15  Yes [name], MD  topiramate (TOPAMAX) 50 MG tablet Take 50 mg by mouth 2 (two) times daily as needed (nerve pain).    Yes Historical Provider, MD  traMADol (ULTRAM) 50 MG tablet Take 100 mg by mouth 2 (two) times daily.    Yes Historical Provider, MD  feeding supplement, ENSURE ENLIVE, (ENSURE ENLIVE) LIQD Take 237 mLs by mouth 2 (two) times daily between meals. Patient not taking: Reported on 12/03/2015 08/26/15   Cristal Ford, DO    Physical Exam: Vitals:   12/03/15 1830 12/03/15 1845 12/03/15 1900 12/03/15 1915  BP: (!) 146/102 (!) 149/103 (!) 146/101 151/99  Pulse: 64 60 63 62  Resp: 15 16 11 16   Temp:      TempSrc:      SpO2: 97% 97% 96% 97%      Constitutional: NAD, but anxious appearing Vitals:   12/03/15 1830 12/03/15 1845 12/03/15 1900 12/03/15 1915  BP: (!) 146/102 (!) 149/103 (!) 146/101 151/99  Pulse: 64 60 63 62  Resp: 15 16 11 16   Temp:      TempSrc:      SpO2: 97% 97% 96% 97%   Eyes: PERRL, lids and conjunctivae normal ENMT: Mucous membranes are moist. Normal dentition.  Neck: normal appearance, supple Respiratory: clear to auscultation bilaterally, no wheezing, no crackles. Normal respiratory effort. No accessory muscle  use.  Cardiovascular: Normal rate, regular rhythm, no murmurs / rubs / gallops. No extremity edema. 2+ pedal pulses. GI: abdomen is soft and compressible.  No  distention.  No tenderness.  No masses palpated.  Bowel sounds are present. Musculoskeletal:  No joint deformity in upper and lower extremities. Good ROM, no contractures. Normal muscle tone. No calf tenderness.  Negative Holman sign bilaterally. Skin: no rashes, warm and dry Neurologic: No focal deficits. Psychiatric: Alert and oriented x 3. Flat affect.  Normal judgement/insight.    Labs on Admission: I have personally reviewed following labs and imaging studies  CBC:  Recent Labs Lab 12/03/15 1450  WBC 8.4  HGB 12.1*  HCT 36.6*  MCV 82.4  PLT 123456   Basic Metabolic Panel:  Recent Labs Lab 12/03/15 1450  NA 139  K 3.2*  CL 105  CO2 25  GLUCOSE 86  BUN 9  CREATININE 1.01  CALCIUM 9.4   GFR: CrCl cannot be calculated (Unknown ideal weight.). Liver Function Tests:  Recent Labs Lab 12/03/15 1410  AST 18  ALT 18  ALKPHOS 71  BILITOT 0.5  PROT 6.6  ALBUMIN 4.0   Cardiac Enzymes:  Recent Labs Lab 12/03/15 1704  CKTOTAL 178   CBG:  Recent Labs Lab 12/03/15 1406 12/03/15 1513  GLUCAP 84 96   Urine analysis:    Component Value Date/Time   COLORURINE YELLOW 12/03/2015 1805   APPEARANCEUR CLEAR 12/03/2015 1805   LABSPEC 1.009 12/03/2015 1805   PHURINE 6.0 12/03/2015 1805   GLUCOSEU NEGATIVE 12/03/2015 1805   HGBUR NEGATIVE 12/03/2015 1805   BILIRUBINUR NEGATIVE 12/03/2015 1805   KETONESUR NEGATIVE 12/03/2015 1805   PROTEINUR NEGATIVE 12/03/2015 1805   UROBILINOGEN 1.0 07/26/2012 1552   NITRITE NEGATIVE 12/03/2015 1805   LEUKOCYTESUR NEGATIVE 12/03/2015 1805    Radiological Exams on Admission: Dg Chest 2 View  Result Date: 12/03/2015 CLINICAL DATA:  Weakness and fatigue.  Chest pain and dyspnea. EXAM: CHEST  2 VIEW COMPARISON:  09/12/2015; 07/15/2015; chest CT - 07/14/2025 FINDINGS: Grossly unchanged cardiac silhouette and mediastinal contours. Evaluation of the retrosternal clear space obscured secondary overlying soft tissues. No focal  airspace opacities. No pleural effusion or pneumothorax. No evidence of edema. Spinal stimulator leads are seen within the mid thoracic spinal canal. Post lumbar paraspinal fusion, incompletely evaluated. IMPRESSION: No acute cardiopulmonary disease. Electronically Signed   By: Sandi Mariscal M.D.   On: 12/03/2015 16:02    EKG: Independently reviewed. As noted above.  Assessment/Plan Principal Problem:   Chest pain Active Problems:   History of non-ST elevation myocardial infarction (NSTEMI)   Hypertension   Hyperlipidemia   Chronic back pain   Diabetes mellitus (HCC)   CAD (coronary artery disease)   Hypokalemia   Unstable angina ([facility])   Left ventricular aneurysm      Active chest pain concerning for unstable angina in a patient with recent NSTEMI, known CAD, ischemic cardiomyopathy --Has received ASA 324mg  x one in the ED.  Resume baby aspirin daily; continue Brilinta for now --Add nitro paste one inch q6h now --Serial troponin, repeat pending now.  If elevated, will call cardiology back to discuss new recommendations (heparin, nitro infusions). --D-Dimer pending; if elevated, will proceed with CTA of the chest since the patient has had recurrent presentations for chest pain since May that have not proven to be related to his CAD at this point --Repeat echo in the AM, which I think is reasonable, since he is having progressive symptoms (although normal BNP is  noted) --Continue BB, ACE-I, statin  --NPO after midnight --Will need to call his primary cardiologist ([name]) in the AM  DM --Hold metformin --SSI coverage before meals  Hypokalemia --Replacement ordered.  Of note, the patient is on chlorthalidone and lasix at baseline and is already on a potassium supplement at home.  Chronic back and leg pain with worsening neuropathic pain in bilateral extremities --Continue home meds for now --Expect he will need outpatient follow-up with his neurosurgeon.  Imaging of his lower  back has been deferred for now while evaluating his active chest pain.   DVT prophylaxis: SCDs Code Status: FULL Family Communication: Patient alone in the ED at the time of admission Disposition Plan: To be determined Consults called: Cardiology (case discussed with Dr. Radford Pax; we will need to call them back as needed). Admission status: Observation, telemetry   TIME SPENT: 70 minutes   Eber Jones MD [facility] Pager [phone]  If 7PM-7AM, please contact night-coverage [email or web address] Password [email or web address]  12/03/2015, 8:10 PM

## 2015-12-03 NOTE — ED Notes (Signed)
Pt unable to void at this time. 

## 2015-12-03 NOTE — ED Triage Notes (Signed)
Pt arrives via stretcher from cardiac rehab unit s/p NSTEMi May 2017 with near syncope, chest pain and SOB after exercising on the treadmill for 20 min today.

## 2015-12-03 NOTE — ED Notes (Signed)
Report attempted 

## 2015-12-03 NOTE — ED Notes (Signed)
Pt ambulated to bathroom without difficulty.

## 2015-12-03 NOTE — ED Notes (Signed)
Pt states he is ready to go he doesn't want to stay for another 5 hours.  MD notified

## 2015-12-03 NOTE — ED Notes (Signed)
Pt requesting pain medicine.

## 2015-12-03 NOTE — Progress Notes (Signed)
Pt c/o weakness and fatigue while walking track at cardiac rehab after approximately 82minutes of exercise.  Pt seated in chair.   Telemetry- HR- 92 sinus rhythm, BP:  120/80, O2 sat-100% CBG-84.   Skin diaphoretic. Pt c/o chest pain 7/10 associated with dyspnea and some nausea. Pt diaphoretic and drowsy at times, arouses easily, oriented x4.  Rapid response notified, 12 lead EKG obtained.  Pt transported to ED via stretcher. Report given to receiving RN.  Unable to reach pt wife to notify of transfer.  Message left on home answering machine.

## 2015-12-03 NOTE — ED Notes (Signed)
MD notified of continued pain. 

## 2015-12-03 NOTE — Progress Notes (Signed)
ANTICOAGULATION CONSULT NOTE - Initial Consult  Pharmacy Consult for Heparin Indication: chest pain/ACS  Allergies  Allergen Reactions  . Toradol [Ketorolac Tromethamine] Anaphylaxis    bp dropped, diaphoresis  . Methylprednisolone Other (See Comments)    B/P dropped and diorphoresis  . Prednisone Other (See Comments)    B/P dropped and patient began to sweat  . Adhesive [Tape] Rash and Other (See Comments)    "blisters" ( NO SURGICAL TAPE, PLEASE)  . Other Other (See Comments)    Allergic reaction to steroid (Prednisone??) Unsure of name B/P dropped and patient began to sweat    Patient Measurements: Height: 5\' 10"  (177.8 cm) Weight: 223 lb 15.8 oz (101.6 kg) IBW/kg (Calculated) : 73 Heparin Dosing Weight: 94kg  Vital Signs: Temp: 97.7 F (36.5 C) (10/09 1803) Temp Source: Oral (10/09 1803) BP: 115/68 (10/09 2215) Pulse Rate: 81 (10/09 2215)  Labs:  Recent Labs  12/03/15 1450 12/03/15 1704 12/03/15 1957  HGB 12.1*  --   --   HCT 36.6*  --   --   PLT 230  --   --   APTT  --   --  30  LABPROT  --   --  13.1  INR  --   --  0.99  CREATININE 1.01  --   --   CKTOTAL  --  178  --   TROPONINI  --   --  0.06*    Estimated Creatinine Clearance: 92.8 mL/min (by C-G formula based on SCr of 1.01 mg/dL).   Medical History: Past Medical History:  Diagnosis Date  . Arthritis    Rheumatoid  . Baker's cyst    Left calf  . CAD in native artery, 07/15/15 PCI of RCA with DES 07/16/2015   a.   NSTEMI 5/17: LHC - pLAD 20, pLCx 20, OM1 30, pRCA 100, EF 45-50%>> PCI: 2.5 x 24 mm Promus DES to RCA  //  b.   Echo 5/17: mild LVH, EF 50-55%, no RWMA, mod RVE //  c. LHC 6/17: pLAD 20, pLCx 20, OM1 50, pRCA stent ok, EF 35-45% with mod sized inf wall and basal segment aneurysm  . Chronic back pain   . Cyst (solitary) of breast    ? cyst left calf ,knee  . Diabetes mellitus    diet controlled  . GERD (gastroesophageal reflux disease)   . Hyperlipidemia   . Hypertension    Dr.  [name] [phone]  . Ischemic cardiomyopathy    a. LV-gram at time of LHC in 6/17 with EF 35-45%  //  b. Echo 7/17: EF 45-50%, inferior HK, grade 1 diastolic dysfunction, mildly dilated aortic root, moderately reduced RVSF, mild RAE  . Myocardial infarction 2017  . 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 Initial troponin elevated. He will begin IV heparin. Baseline labs wnl.  Goal of Therapy:  Heparin level 0.3-0.7 units/ml Monitor platelets by anticoagulation protocol: Yes   Plan:  1) Heparin bolus 4000 units x 1 2) Heparin drip at 1600 units/hr 3) Check 6 hour heparin level 4) Daily heparin level and CBC  Deboraha Sprang 12/03/2015,10:31 PM

## 2015-12-04 ENCOUNTER — Observation Stay (HOSPITAL_COMMUNITY): Payer: Medicare Other

## 2015-12-04 DIAGNOSIS — R079 Chest pain, unspecified: Secondary | ICD-10-CM

## 2015-12-04 DIAGNOSIS — K219 Gastro-esophageal reflux disease without esophagitis: Secondary | ICD-10-CM | POA: Diagnosis present

## 2015-12-04 DIAGNOSIS — E876 Hypokalemia: Secondary | ICD-10-CM | POA: Diagnosis present

## 2015-12-04 DIAGNOSIS — Z7984 Long term (current) use of oral hypoglycemic drugs: Secondary | ICD-10-CM | POA: Diagnosis not present

## 2015-12-04 DIAGNOSIS — R55 Syncope and collapse: Secondary | ICD-10-CM

## 2015-12-04 DIAGNOSIS — I959 Hypotension, unspecified: Secondary | ICD-10-CM | POA: Diagnosis not present

## 2015-12-04 DIAGNOSIS — I2510 Atherosclerotic heart disease of native coronary artery without angina pectoris: Secondary | ICD-10-CM | POA: Diagnosis present

## 2015-12-04 DIAGNOSIS — R001 Bradycardia, unspecified: Principal | ICD-10-CM

## 2015-12-04 DIAGNOSIS — I25110 Atherosclerotic heart disease of native coronary artery with unstable angina pectoris: Secondary | ICD-10-CM | POA: Diagnosis not present

## 2015-12-04 DIAGNOSIS — T447X5A Adverse effect of beta-adrenoreceptor antagonists, initial encounter: Secondary | ICD-10-CM | POA: Diagnosis present

## 2015-12-04 DIAGNOSIS — R0609 Other forms of dyspnea: Secondary | ICD-10-CM | POA: Diagnosis present

## 2015-12-04 DIAGNOSIS — Z7982 Long term (current) use of aspirin: Secondary | ICD-10-CM | POA: Diagnosis not present

## 2015-12-04 DIAGNOSIS — R5383 Other fatigue: Secondary | ICD-10-CM | POA: Diagnosis not present

## 2015-12-04 DIAGNOSIS — R0789 Other chest pain: Secondary | ICD-10-CM | POA: Diagnosis present

## 2015-12-04 DIAGNOSIS — I252 Old myocardial infarction: Secondary | ICD-10-CM | POA: Diagnosis not present

## 2015-12-04 DIAGNOSIS — I255 Ischemic cardiomyopathy: Secondary | ICD-10-CM | POA: Diagnosis present

## 2015-12-04 DIAGNOSIS — E1159 Type 2 diabetes mellitus with other circulatory complications: Secondary | ICD-10-CM | POA: Diagnosis not present

## 2015-12-04 DIAGNOSIS — Z79899 Other long term (current) drug therapy: Secondary | ICD-10-CM | POA: Diagnosis not present

## 2015-12-04 DIAGNOSIS — E119 Type 2 diabetes mellitus without complications: Secondary | ICD-10-CM | POA: Diagnosis present

## 2015-12-04 DIAGNOSIS — G629 Polyneuropathy, unspecified: Secondary | ICD-10-CM | POA: Diagnosis present

## 2015-12-04 DIAGNOSIS — R531 Weakness: Secondary | ICD-10-CM

## 2015-12-04 DIAGNOSIS — Z955 Presence of coronary angioplasty implant and graft: Secondary | ICD-10-CM | POA: Diagnosis not present

## 2015-12-04 DIAGNOSIS — Z87891 Personal history of nicotine dependence: Secondary | ICD-10-CM | POA: Diagnosis not present

## 2015-12-04 DIAGNOSIS — M549 Dorsalgia, unspecified: Secondary | ICD-10-CM | POA: Diagnosis present

## 2015-12-04 DIAGNOSIS — E785 Hyperlipidemia, unspecified: Secondary | ICD-10-CM

## 2015-12-04 DIAGNOSIS — I110 Hypertensive heart disease with heart failure: Secondary | ICD-10-CM | POA: Diagnosis present

## 2015-12-04 DIAGNOSIS — I5042 Chronic combined systolic (congestive) and diastolic (congestive) heart failure: Secondary | ICD-10-CM | POA: Diagnosis present

## 2015-12-04 DIAGNOSIS — Z8249 Family history of ischemic heart disease and other diseases of the circulatory system: Secondary | ICD-10-CM | POA: Diagnosis not present

## 2015-12-04 DIAGNOSIS — G8929 Other chronic pain: Secondary | ICD-10-CM | POA: Diagnosis present

## 2015-12-04 DIAGNOSIS — I200 Unstable angina: Secondary | ICD-10-CM

## 2015-12-04 DIAGNOSIS — Y92009 Unspecified place in unspecified non-institutional (private) residence as the place of occurrence of the external cause: Secondary | ICD-10-CM | POA: Diagnosis not present

## 2015-12-04 LAB — CBC
HCT: 36.3 % — ABNORMAL LOW (ref 39.0–52.0)
HCT: 37.4 % — ABNORMAL LOW (ref 39.0–52.0)
Hemoglobin: 11.8 g/dL — ABNORMAL LOW (ref 13.0–17.0)
Hemoglobin: 12.1 g/dL — ABNORMAL LOW (ref 13.0–17.0)
MCH: 26.9 pg (ref 26.0–34.0)
MCH: 26.8 pg (ref 26.0–34.0)
MCHC: 32.5 g/dL (ref 30.0–36.0)
MCHC: 32.4 g/dL (ref 30.0–36.0)
MCV: 82.7 fL (ref 78.0–100.0)
MCV: 82.7 fL (ref 78.0–100.0)
Platelets: 223 10*3/uL (ref 150–400)
Platelets: 230 10*3/uL (ref 150–400)
RBC: 4.39 MIL/uL (ref 4.22–5.81)
RBC: 4.52 MIL/uL (ref 4.22–5.81)
RDW: 17.2 % — ABNORMAL HIGH (ref 11.5–15.5)
RDW: 17.1 % — ABNORMAL HIGH (ref 11.5–15.5)
WBC: 6.7 10*3/uL (ref 4.0–10.5)
WBC: 7.2 10*3/uL (ref 4.0–10.5)

## 2015-12-04 LAB — GLUCOSE, CAPILLARY
Glucose-Capillary: 107 mg/dL — ABNORMAL HIGH (ref 65–99)
Glucose-Capillary: 116 mg/dL — ABNORMAL HIGH (ref 65–99)
Glucose-Capillary: 99 mg/dL (ref 65–99)
Glucose-Capillary: 131 mg/dL — ABNORMAL HIGH (ref 65–99)
Glucose-Capillary: 117 mg/dL — ABNORMAL HIGH (ref 65–99)

## 2015-12-04 LAB — BASIC METABOLIC PANEL
Anion gap: 8 (ref 5–15)
BUN: <5 mg/dL — ABNORMAL LOW (ref 6–20)
CO2: 26 mmol/L (ref 22–32)
Calcium: 9.3 mg/dL (ref 8.9–10.3)
Chloride: 106 mmol/L (ref 101–111)
Creatinine, Ser: 0.84 mg/dL (ref 0.61–1.24)
GFR calc Af Amer: >60 mL/min (ref >60)
GFR calc non Af Amer: >60 mL/min (ref >60)
Glucose, Bld: 105 mg/dL — ABNORMAL HIGH (ref 65–99)
Potassium: 3.1 mmol/L — ABNORMAL LOW (ref 3.5–5.1)
Sodium: 140 mmol/L (ref 135–145)

## 2015-12-04 LAB — RAPID URINE DRUG SCREEN, HOSP PERFORMED
Amphetamines: NOT DETECTED
Barbiturates: NOT DETECTED
Benzodiazepines: POSITIVE — AB
Cocaine: NOT DETECTED
Opiates: POSITIVE — AB
Tetrahydrocannabinol: NOT DETECTED

## 2015-12-04 LAB — TROPONIN I
Troponin I: 0.05 ng/mL (ref <0.03)
Troponin I: 0.06 ng/mL (ref <0.03)
Troponin I: 0.06 ng/mL (ref <0.03)
Troponin I: 0.06 ng/mL (ref <0.03)

## 2015-12-04 LAB — HEPARIN LEVEL (UNFRACTIONATED)
Heparin Unfractionated: 0.15 IU/mL — ABNORMAL LOW (ref 0.30–0.70)
Heparin Unfractionated: 0.45 IU/mL (ref 0.30–0.70)

## 2015-12-04 LAB — MRSA PCR SCREENING: MRSA by PCR: NEGATIVE

## 2015-12-04 MED ORDER — PREGABALIN 25 MG PO CAPS
100.0000 mg | ORAL_CAPSULE | Freq: Three times a day (TID) | ORAL | Status: DC
  Administered 2015-12-04 – 2015-12-05 (×3): 100 mg via ORAL
  Filled 2015-12-04 (×3): qty 4

## 2015-12-04 MED ORDER — OXYCODONE HCL 5 MG PO TABS
10.0000 mg | ORAL_TABLET | ORAL | Status: DC | PRN
  Administered 2015-12-04 – 2015-12-05 (×8): 10 mg via ORAL
  Filled 2015-12-04 (×7): qty 2

## 2015-12-04 MED ORDER — MORPHINE SULFATE (PF) 2 MG/ML IV SOLN
2.0000 mg | INTRAVENOUS | Status: DC | PRN
  Administered 2015-12-04: 2 mg via INTRAVENOUS
  Filled 2015-12-04: qty 1

## 2015-12-04 MED ORDER — LISINOPRIL 20 MG PO TABS: 20.0000 mg | ORAL_TABLET | Freq: Every day | ORAL | Status: DC

## 2015-12-04 MED ORDER — TOPIRAMATE 25 MG PO TABS
50.0000 mg | ORAL_TABLET | Freq: Two times a day (BID) | ORAL | Status: DC | PRN
  Filled 2015-12-04: qty 2

## 2015-12-04 MED ORDER — TRAMADOL HCL 50 MG PO TABS
100.0000 mg | ORAL_TABLET | Freq: Two times a day (BID) | ORAL | Status: DC
Start: 2015-12-04 — End: 2015-12-05
  Administered 2015-12-04 – 2015-12-05 (×3): 100 mg via ORAL
  Filled 2015-12-04 (×3): qty 2

## 2015-12-04 MED ORDER — METOPROLOL TARTRATE 25 MG PO TABS
25.0000 mg | ORAL_TABLET | Freq: Two times a day (BID) | ORAL | Status: DC
  Administered 2015-12-04: 25 mg via ORAL
  Filled 2015-12-04: qty 1

## 2015-12-04 MED ORDER — DILTIAZEM HCL ER 180 MG PO CP24: 180.0000 mg | ORAL_CAPSULE | Freq: Every day | ORAL | Status: DC

## 2015-12-04 MED ORDER — DILTIAZEM HCL ER COATED BEADS 180 MG PO CP24
180.0000 mg | ORAL_CAPSULE | Freq: Every day | ORAL | Status: DC
  Administered 2015-12-04: 180 mg via ORAL
  Filled 2015-12-04: qty 1

## 2015-12-04 MED ORDER — FUROSEMIDE 40 MG PO TABS
40.0000 mg | ORAL_TABLET | Freq: Every day | ORAL | Status: DC
  Administered 2015-12-04: 40 mg via ORAL
  Filled 2015-12-04: qty 1

## 2015-12-04 MED ORDER — CHLORTHALIDONE 25 MG PO TABS
25.0000 mg | ORAL_TABLET | Freq: Every day | ORAL | Status: DC
Start: 2015-12-04 — End: 2015-12-04
  Administered 2015-12-04: 25 mg via ORAL
  Filled 2015-12-04: qty 1

## 2015-12-04 MED ORDER — ATORVASTATIN CALCIUM 80 MG PO TABS: 80.0000 mg | ORAL_TABLET | Freq: Every day | ORAL | Status: DC

## 2015-12-04 MED ORDER — ASPIRIN EC 81 MG PO TBEC: 81.0000 mg | DELAYED_RELEASE_TABLET | Freq: Every day | ORAL | Status: DC

## 2015-12-04 MED ORDER — NITROGLYCERIN 0.4 MG SL SUBL: 0.4000 mg | SUBLINGUAL_TABLET | SUBLINGUAL | Status: DC | PRN

## 2015-12-04 MED ORDER — TICAGRELOR 90 MG PO TABS
90.0000 mg | ORAL_TABLET | Freq: Two times a day (BID) | ORAL | Status: DC
  Administered 2015-12-04 – 2015-12-05 (×3): 90 mg via ORAL
  Filled 2015-12-04 (×3): qty 1

## 2015-12-04 MED ORDER — ONDANSETRON HCL 4 MG/2ML IJ SOLN
INTRAMUSCULAR | Status: AC
  Filled 2015-12-04: qty 2

## 2015-12-04 MED ORDER — INSULIN ASPART 100 UNIT/ML ~~LOC~~ SOLN
0.0000 [IU] | Freq: Three times a day (TID) | SUBCUTANEOUS | Status: DC
  Administered 2015-12-04: 2 [IU] via SUBCUTANEOUS

## 2015-12-04 MED ORDER — IOPAMIDOL (ISOVUE-370) INJECTION 76%
INTRAVENOUS | Status: AC
  Filled 2015-12-04: qty 100

## 2015-12-04 MED ORDER — ONDANSETRON HCL 4 MG/2ML IJ SOLN
4.0000 mg | Freq: Four times a day (QID) | INTRAMUSCULAR | Status: DC | PRN
Start: 2015-12-04 — End: 2015-12-05
  Administered 2015-12-04: 4 mg via INTRAVENOUS

## 2015-12-04 MED ORDER — POTASSIUM CHLORIDE CRYS ER 20 MEQ PO TBCR
20.0000 meq | EXTENDED_RELEASE_TABLET | Freq: Two times a day (BID) | ORAL | Status: DC
  Administered 2015-12-04 (×2): 20 meq via ORAL
  Filled 2015-12-04 (×2): qty 1

## 2015-12-04 MED ORDER — ACETAMINOPHEN 325 MG PO TABS: 650.0000 mg | ORAL_TABLET | ORAL | Status: DC | PRN

## 2015-12-04 MED ORDER — DULOXETINE HCL 60 MG PO CPEP
60.0000 mg | ORAL_CAPSULE | Freq: Every day | ORAL | Status: DC
Start: 2015-12-04 — End: 2015-12-05
  Administered 2015-12-04 – 2015-12-05 (×2): 60 mg via ORAL
  Filled 2015-12-04 (×2): qty 1

## 2015-12-04 MED ORDER — OXYCODONE HCL 5 MG PO TABS
ORAL_TABLET | ORAL | Status: AC
  Filled 2015-12-04: qty 2

## 2015-12-04 MED ORDER — PANTOPRAZOLE SODIUM 40 MG PO TBEC
40.0000 mg | DELAYED_RELEASE_TABLET | Freq: Every day | ORAL | Status: DC
  Administered 2015-12-04 – 2015-12-05 (×2): 40 mg via ORAL
  Filled 2015-12-04 (×2): qty 1

## 2015-12-04 MED ORDER — ASPIRIN EC 81 MG PO TBEC
81.0000 mg | DELAYED_RELEASE_TABLET | Freq: Every day | ORAL | Status: DC
  Administered 2015-12-04 – 2015-12-05 (×2): 81 mg via ORAL
  Filled 2015-12-04 (×2): qty 1

## 2015-12-04 MED ORDER — DIAZEPAM 5 MG PO TABS
10.0000 mg | ORAL_TABLET | Freq: Two times a day (BID) | ORAL | Status: DC
  Administered 2015-12-04 (×2): 10 mg via ORAL
  Filled 2015-12-04 (×3): qty 2

## 2015-12-04 NOTE — Significant Event (Addendum)
Rapid Response Event Note  Overview: Time Called: [id number] Arrival Time: [id number] Event Type: Cardiac, Hypotension  Initial Focused Assessment: Called by RN. Patient was bradycardiac in the 30s and hypotensive (SBP in the 60s).  Upon arrival, patient was diaphoretic, nauseated, HR in the mid 50s, SBP still in the 60s.  Patient placed in [place] position, fluid bolus started.  Patient was on NTG and heparin for ACS, NTG was stopped prior to my arrival.  Initally patient c/o of HA (NTG induced ?), it improved, however patient did have persistent RLQ ABD pain.  EKG was obtained, no acute changes noted, MD at bedside.  SBP did improve into the low 100-110s, patient was still symptomatic (dizzy, lightheaded) when his [facility] was raised.  This episode started while patient was trying to urinate, perhaps, this is vagal response.  S/p NSTEMI in MAY, developed similar symptoms in CARDIAC REHAB yesterday and was readmitted.   Interventions: -- gentle bolus started, transitioned to NS at 100 (Patient NPO, incase needs a procedure, ie Cardiac Cath) -- EKG obtained -- CARDS called by MD and RNs, waiting for their input  Plan of Care (if not transferred): SBP improved, called for update at 1511, patient much better, symptoms resolved  Event Summary: Name of Physician Notified: Dr. Ree Kida at 1405    at    Outcome: Stayed in room and stabalized  Event End Time: [place], [place]

## 2015-12-04 NOTE — Significant Event (Signed)
Rapid Response Event Note Call received per floor RN regarding Pt with left shoulder and left side pain. Recently admitted from ED with same pain, NTG gtt on at 15 mcg/minute with no relief. RN assessing Pt with new right side weakness and lethargy. Advised RN to page admitting MD for update and orders while RRT en route.   Overview: Time Called: [id number] Arrival Time: 0046 Event Type: Cardiac, Neurologic  Initial Focused Assessment: Pt found resting in bed, lethargic oriented to self and location, confused on date. Weakness assessed 4/5 bilateral upper extremeties 3/5 strength bilateral lower extremities. Pt complains of left shoulder, left flank pain 5/10 chest pain only with deep breaths. Lung sounds clear. SR-SB rate 59-70 on monitor, EKG completes similar to EKG done in ED.   Interventions: NTG gtt increased to 20 mcg/min with no improvement of pain. Dr. Eulas Post paged and updated on Pt status. Orders received for STAT head CT and to hold heparin gtt (not started yet) until CT head resulted and reviewed per MD, also ordered to not titrate NTG gtt further at this time. MD to bedside to assess Pt just prior to leaving for CT. EKG reviewed per MD, CTA Aorta also ordered. CT completed and Pt brought back to 2C. Troponin lab drawn upon arrival back to unit, serial troponins orders tonight. Cardiology consult placed in ED, unsure when Cardiologist to see Patient. Pt left resting in bed more awake at this time left shoulder and side pain 6/10 . Morphine IVP ordered PRN and Foley Cath ordered due to urinary retention.   Plan of Care (if not transferred): RN advised to monitor Pt closely and call CT results to MD. RN to notify Provder and myself for worsening changes.   Event Summary: Name of Physician Notified: Dr. Eulas Post at [phone]    at    Outcome: Stayed in room and stabalized  Event End Time: [place], [place]

## 2015-12-04 NOTE — Progress Notes (Addendum)
PROGRESS NOTE    Thomas Mcclure  [id number] DOB: 06-19-1955 DOA: 12/03/2015 PCP: [name], MD   Chief Complaint  Patient presents with  . Near Syncope  . Chest Pain  . Shortness of Breath     Brief Narrative:  60 year old male with a history of CAD, given cardiomyopathy with an EF 40-45%, hypertension, hyperlipidemia, diabetes, presented with recurrent chest pain shortness of breath. Patient was participating cardiac rehabilitation and he started to have a near syncopal episode while walking around the track. He complained of shortness of breath. Patient was referred to the emergency department for evaluation. Patient was admitted for chest pain. He was also noted to have generalized weakness. Cardiology consulted. Assessment & Plan   Addendum:  Rapid response called as patient was noted to become hypotensive and bradycardia (HR in the 30s).  It seems that patient was attempting to urinate when this occurred.  Likely vasovagal.  Cardiology consulted. Nitro drip discontinued.  Placed on gentle IVF.  VSS currently stable.  Chest pain, unstable angina -Patient has known history of NSTEMI and ischemic cardiomyopathy -He received aspirin 324 mg once upon arrival to the emergency department. Currently on Birlinta time as well as aspirin daily. -Cardiology consulted and appreciated -Troponin cycled and has been fairly flat, 0.05-0.06 -D-dimer 0.27 -CTA chest showed no aortic dissection or acute vascular abnormality -Given ongoing chest pain, patient was placed on nitroglycerin as well as heparin drip -Continue lisinopril, metoprolol, aspirin, statin  Near syncopal episode -Upon admission yesterday evening, breath response was called as patient had a near syncopal episode with generalized weakness and lethargy. 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 -Echocardiogram 08/25/2015 showed EF of  [id number], grade 1 diastolic dysfunction -Patient currently euvolemic -Monitor intake and output, daily weights -Continue metoprolol, lisinopril, Lasix  Diabetes mellitus, type II -Metformin held, continue insulin sliding scale and CBG monitoring  Hypokalemia -Continue to replace and monitor BMP  Chronic back and leg pain with neuropathy -Patient only to follow-up with his neurosurgeon as an outpatient -Given his chest pain, imaging of the lower back has been deferred -Continue pain control with tramadol oxycodone  DVT Prophylaxis  heparin  Code Status: Full  Family Communication: None at bedside  Disposition Plan: Currently in observation. Continue to monitor in stepdown due to ongoing chest pain.   Consultants Cardiology  Procedures  none  Antibiotics   Anti-infectives    None      Subjective:   Thomas Mcclure seen and examined today.  Patient continues to complain of chest pain and left shoulder pain. Has also been having increased shortness of breath over the last several weeks. States he's gained approximately 5 pounds. Currently denies any abdominal pain, nausea or vomiting, diarrhea constipation, dizziness or headache. States he is ready to go home as he has "lots of things to do."  Objective:   Vitals:   12/04/15 0645 12/04/15 0700 12/04/15 0759 12/04/15 1100  BP: 111/84 104/75 98/82 111/85  Pulse:      Resp: (!) 23 14 13  (!) 9  Temp:   97.8 F (36.6 C) 97.8 F (36.6 C)  TempSrc:   Oral Oral  SpO2: 95% 96% 98% 97%  Weight:      Height:        Intake/Output Summary (Last 24 hours) at 12/04/15 1336 Last data filed at 12/04/15 1136  Gross per 24 hour  Intake  181.2 ml  Output             1400 ml  Net          -1218.8 ml   Filed Weights   12/03/15 2215 12/03/15 2342 12/04/15 0447  Weight: 101.6 kg (223 lb 15.8 oz) 99.7 kg (219 lb 11.2 oz) 99.7 kg (219 lb 12.8 oz)    Exam  General: Well developed, well nourished, NAD, appears stated  age  47: NCAT,mucous membranes moist.   Cardiovascular: S1 S2 auscultated, no rubs, murmurs or gallops. Regular rate and rhythm.  Respiratory: Clear to auscultation bilaterally with equal chest rise  Abdomen: Soft, nontender, nondistended, + bowel sounds  Extremities: warm dry without cyanosis clubbing or edema  Neuro: AAOx3, nonfocal  Psych: Normal affect and demeanor, not sure patient has insight into this health and medical problems  Data Reviewed: I have personally reviewed following labs and imaging studies  CBC:  Recent Labs Lab 12/03/15 1450 12/04/15 0400 12/04/15 0707  WBC 8.4 6.7 7.2  HGB 12.1* 11.8* 12.1*  HCT 36.6* 36.3* 37.4*  MCV 82.4 82.7 82.7  PLT 230 223 [id number]   Basic Metabolic Panel:  Recent Labs Lab 12/03/15 1450 12/04/15 0707  NA 139 140  K 3.2* 3.1*  CL 105 106  CO2 25 26  GLUCOSE 86 105*  BUN 9 <5*  CREATININE 1.01 0.84  CALCIUM 9.4 9.3   GFR: Estimated Creatinine Clearance: 110.7 mL/min (by C-G formula based on SCr of 0.84 mg/dL). Liver Function Tests:  Recent Labs Lab 12/03/15 1410  AST 18  ALT 18  ALKPHOS 71  BILITOT 0.5  PROT 6.6  ALBUMIN 4.0   No results for input(s): LIPASE, AMYLASE in the last 168 hours. No results for input(s): AMMONIA in the last 168 hours. Coagulation Profile:  Recent Labs Lab 12/03/15 1957  INR 0.99   Cardiac Enzymes:  Recent Labs Lab 12/03/15 1704 12/03/15 1957 12/04/15 0111 12/04/15 0400 12/04/15 0707  CKTOTAL 178  --   --   --   --   TROPONINI  --  0.06* 0.05* 0.06* 0.06*  0.06*   BNP (last 3 results) No results for input(s): PROBNP in the last 8760 hours. HbA1C: No results for input(s): HGBA1C in the last 72 hours. CBG:  Recent Labs Lab 12/03/15 1406 12/03/15 1513 12/03/15 2337 12/04/15 0757 12/04/15 1206  GLUCAP 84 96 87 107* 116*   Lipid Profile: No results for input(s): CHOL, HDL, LDLCALC, TRIG, CHOLHDL, LDLDIRECT in the last 72 hours. Thyroid Function Tests: No  results for input(s): TSH, T4TOTAL, FREET4, T3FREE, THYROIDAB in the last 72 hours. Anemia Panel: No results for input(s): VITAMINB12, FOLATE, FERRITIN, TIBC, IRON, RETICCTPCT in the last 72 hours. Urine analysis:    Component Value Date/Time   COLORURINE YELLOW 12/03/2015 1805   APPEARANCEUR CLEAR 12/03/2015 1805   LABSPEC 1.009 12/03/2015 1805   PHURINE 6.0 12/03/2015 1805   GLUCOSEU NEGATIVE 12/03/2015 1805   HGBUR NEGATIVE 12/03/2015 1805   BILIRUBINUR NEGATIVE 12/03/2015 1805   KETONESUR NEGATIVE 12/03/2015 1805   PROTEINUR NEGATIVE 12/03/2015 1805   UROBILINOGEN 1.0 07/26/2012 1552   NITRITE NEGATIVE 12/03/2015 1805   LEUKOCYTESUR NEGATIVE 12/03/2015 1805   Sepsis Labs: @LABRCNTIP (procalcitonin:4,lacticidven:4)  ) Recent Results (from the past 240 hour(s))  MRSA PCR Screening     Status: None   Collection Time: 12/03/15 11:42 PM  Result Value Ref Range Status   MRSA by PCR NEGATIVE NEGATIVE Final    Comment:  The GeneXpert MRSA Assay (FDA approved for NASAL specimens only), is one component of a comprehensive MRSA colonization surveillance program. It is not intended to diagnose MRSA infection nor to guide or monitor treatment for MRSA infections.       Radiology Studies: Dg Chest 2 View  Result Date: 12/03/2015 CLINICAL DATA:  Weakness and fatigue.  Chest pain and dyspnea. EXAM: CHEST  2 VIEW COMPARISON:  09/12/2015; 07/15/2015; chest CT - 07/14/2025 FINDINGS: Grossly unchanged cardiac silhouette and mediastinal contours. Evaluation of the retrosternal clear space obscured secondary overlying soft tissues. No focal airspace opacities. No pleural effusion or pneumothorax. No evidence of edema. Spinal stimulator leads are seen within the mid thoracic spinal canal. Post lumbar paraspinal fusion, incompletely evaluated. IMPRESSION: No acute cardiopulmonary disease. Electronically Signed   By: Sandi Mariscal M.D.   On: 12/03/2015 16:02   Ct Head Wo  Contrast  Result Date: 12/04/2015 CLINICAL DATA:  60 y/o  M; weakness with onset yesterday. EXAM: CT HEAD WITHOUT CONTRAST TECHNIQUE: Contiguous axial images were obtained from the base of the skull through the vertex without intravenous contrast. COMPARISON:  08/24/2015 CT of the head. FINDINGS: Brain: No evidence of acute infarction, hemorrhage, hydrocephalus, extra-axial collection or mass lesion/mass effect. Vascular: No hyperdense vessel or unexpected calcification. Skull: Normal. Negative for fracture or focal lesion. Sinuses/Orbits: Mild diffuse paranasal sinus mucosal thickening. Orbits are unremarkable. Other: None. IMPRESSION: 1. No acute intracranial abnormality is identified. 2. Mild paranasal sinus disease. 3. Otherwise unremarkable CT of head for age. Electronically Signed   By: Kristine Garbe M.D.   On: 12/04/2015 01:44   Ct Angio Chest Aorta W/cm &/or Wo/cm  Result Date: 12/04/2015 CLINICAL DATA:  Chest pain and shortness of breath. EXAM: CT ANGIOGRAPHY CHEST WITH CONTRAST TECHNIQUE: Multidetector CT imaging of the chest was performed using the standard protocol during bolus administration of intravenous contrast. Multiplanar CT image reconstructions and MIPs were obtained to evaluate the vascular anatomy. CONTRAST:  100 cc Isovue 370 IV COMPARISON:  Radiographs yesterday.  Chest CT 07/15/2015 FINDINGS: Cardiovascular: Preferential opacification of the thoracic aorta. No evidence of thoracic aortic aneurysm or dissection. No filling defects in the pulmonary arteries to the distal lobar level. Normal heart size. No pericardial effusion, physiologic fluid within the pericardial recess. Coronary artery calcifications or stents. Mediastinum/Nodes: Small mediastinal nodes are not enlarged by size criteria. No hilar adenopathy. The esophagus is decompressed. Thyroid gland is normal. Lungs/Pleura: Mild apical predominant emphysema. Diminished bronchial thickening from prior. Streaky lower  lobe opacities consistent with atelectasis. Tiny subpleural nodule in the right middle lobe image 62 series 7 is unchanged. No pleural fluid. Upper Abdomen: No acute abnormality. Musculoskeletal: Spinal stimulator in place with mild degenerative change in the spine. There are no acute or suspicious osseous abnormalities. Review of the MIP images confirms the above findings. IMPRESSION: 1. No aortic dissection or acute vascular abnormality. Coronary artery calcifications. 2. Mild emphysema.  Streaky lower lobe atelectasis. Electronically Signed   By: Jeb Levering M.D.   On: 12/04/2015 01:49     Scheduled Meds: . aspirin EC  81 mg Oral Daily  . atorvastatin  80 mg Oral q1800  . chlorthalidone  25 mg Oral Daily  . diazepam  10 mg Oral BID  . diltiazem  180 mg Oral Daily  . DULoxetine  60 mg Oral Daily  . furosemide  40 mg Oral Daily  . heparin  4,000 Units Intravenous Once  . insulin aspart  0-15 Units Subcutaneous TID WC  .  lisinopril  20 mg Oral QHS  . metoprolol tartrate  25 mg Oral BID  . oxyCODONE      . pantoprazole  40 mg Oral Daily  . potassium chloride SA  20 mEq Oral BID  . pregabalin  100 mg Oral TID  . ticagrelor  90 mg Oral BID  . traMADol  100 mg Oral BID   Continuous Infusions: . heparin 1,600 Units/hr (12/04/15 0241)  . nitroGLYCERIN 20 mcg/min (12/04/15 0045)     LOS: 0 days   Time Spent in minutes   30 minutes  Fuller Makin D.O. on 12/04/2015 at 1:36 PM  Between 7am to 7pm - Pager - [phone]  After 7pm go to [email or web address] - password [email or web address]  And look for the night coverage person covering for me after hours  [facility] Office  [phone]

## 2015-12-04 NOTE — Consult Note (Signed)
Cardiology Consult    Patient ID: Thomas Mcclure MRN: [id number], DOB/AGE: [birth year]   Admit date: 12/03/2015 Date of Consult: 12/04/2015  Primary Physician: [name], MD Primary Cardiologist: [name] Requesting Provider: Dr. Ree Kida Reason for Consultation: Bradycardia  Patient Profile    60 yo male with PMH of CAD s/p DES to pRCA, DM II, HTN, HLD and chronic back pain who presented to the [facility] ED after experiencing a near syncopal episode while at cardiac rehab.   Past Medical History   Past Medical History:  Diagnosis Date  . Arthritis    Rheumatoid  . Baker's cyst    Left calf  . CAD in native artery, 07/15/15 PCI of RCA with DES 07/16/2015   a.   NSTEMI 5/17: LHC - pLAD 20, pLCx 20, OM1 30, pRCA 100, EF 45-50%>> PCI: 2.5 x 24 mm Promus DES to RCA  //  b.   Echo 5/17: mild LVH, EF 50-55%, no RWMA, mod RVE //  c. LHC 6/17: pLAD 20, pLCx 20, OM1 50, pRCA stent ok, EF 35-45% with mod sized inf wall and basal segment aneurysm  . Chronic back pain   . Cyst (solitary) of breast    ? cyst left calf ,knee  . Diabetes mellitus    diet controlled  . GERD (gastroesophageal reflux disease)   . Hyperlipidemia   . Hypertension    Dr. [name] [phone]  . Ischemic cardiomyopathy    a. LV-gram at time of LHC in 6/17 with EF 35-45%  //  b. Echo 7/17: EF 45-50%, inferior HK, grade 1 diastolic dysfunction, mildly dilated aortic root, moderately reduced RVSF, mild RAE  . Myocardial infarction 2017  . 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 CARDIAC CATHETERIZATION N/A 07/15/2015   Procedure: Left Heart Cath and Coronary Angiography;  Surgeon: [name], MD;  Location: [facility];  Service: Cardiovascular;  Laterality: N/A;  . CARDIAC CATHETERIZATION N/A 07/15/2015   Procedure: Coronary Stent Intervention;  Surgeon: [name], MD;  Location: [facility];  Service: Cardiovascular;   Laterality: N/A;  . CARDIAC CATHETERIZATION N/A 08/07/2015   Procedure: Left Heart Cath and Coronary Angiography;  Surgeon: [name], MD;  Location: [facility];  Service: Cardiovascular;  Laterality: N/A;  . CORONARY STENT PLACEMENT  07/2015  . KNEE SURGERY    . LUMBAR SPINE SURGERY  03/2009  & 2012  . MASS EXCISION N/A 04/19/2012   Procedure: removal of posterior cervical lipoma;  Surgeon: [name], MD;  Location: [facility];  Service: Neurosurgery;  Laterality: N/A;  Removal of posterior cervical lipoma  . SPINAL CORD STIMULATOR INSERTION N/A 01/07/2013   Procedure:  SPINAL CORD STIMULATOR INSERTION;  Surgeon: [name], MD;  Location: [facility];  Service: Neurosurgery;  Laterality: N/A;  . WISDOM TOOTH EXTRACTION     hx of     Allergies  Allergies  Allergen Reactions  . Toradol [Ketorolac Tromethamine] Anaphylaxis    bp dropped, diaphoresis  . Methylprednisolone Other (See Comments)    B/P dropped and diorphoresis  . Prednisone Other (See Comments)    B/P dropped and patient began to sweat  . Adhesive [Tape] Rash and Other (See Comments)    "blisters" ( NO SURGICAL TAPE, PLEASE)  . Other Other (See Comments)    Allergic reaction to steroid (Prednisone??) Unsure of name  B/P dropped and patient began to sweat    History of Present Illness    Thomas Mcclure is a 60 yo male who was admitted in 5/17 with a non-STEMI. LHC demonstrated an occluded proximal RCA which was treated with aspiration thrombectomy and DES. Echocardiogram demonstrated mild LVH with normal LV function and normal wall motion, moderate RVE and hypocontractility of the RV apex.    Admitted in 6/17 with symptoms of unstable angina and minimally elevated troponin levels without clear trend. LHC demonstrated patent RCA stent and no significant CAD elsewhere. There was an OM1 50% stenosis. 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  Readmitted  6/30-7/2 with generalized fatigue. Follow-up echocardiogram demonstrated EF 45-50% with inferior hypokinesis and mild diastolic dysfunction. RVSF was moderately reduced. Patient did have mildly elevated troponin levels without clear trend.   He was last seen in the office on 10/04/15 by Richardson Dopp. Reported continuing to feel weak, but denied any further chest pain. Did report some dyspnea, but no PND or orthopnea. He was continued on his home mediation without any changes.   States progressive dyspnea over the past 2 weeks, waking up in the early morning SOB and unable to catch his breath. Also reports increased DOE with walking in his driveway. Yesterday he was at cardiac rehab when he began to experience a sudden onset of chest pain with dyspnea and nausea. Also became diaphoretic and lethargic. Did not experience a complete LOC. In the ED EKG was negative for acute changes. I stat trop was neg, K 3.2, with other electrolytes stable. BNP was normal. D-dimer was negative. He was admitted to Internal medicine for further management.   Appears he reported worsening chest pain during the early morning and was placed on IV nitro and heparin. Cardiology called today after he experienced another presyncopal episode while using the urinal this afternoon. He reports a sudden onset of nausea, weakness, dyspnea and diaphoresis. HR noted to drop into the 30s, along with a drop in BP. IV nitro stopped, he was placed in trendelenburg and IV fluid given. No LOC with this event. Continues to reports ongoing left sided chest pain since being a cardiac rehab yesterday, stating the pain has subsided but no fully resolved.   Inpatient Medications    . ondansetron      . aspirin EC  81 mg Oral Daily  . atorvastatin  80 mg Oral q1800  . chlorthalidone  25 mg Oral Daily  . diazepam  10 mg Oral BID  . diltiazem  180 mg Oral Daily  . DULoxetine  60 mg Oral Daily  . furosemide  40 mg Oral Daily  . heparin  4,000 Units  Intravenous Once  . insulin aspart  0-15 Units Subcutaneous TID WC  . lisinopril  20 mg Oral QHS  . metoprolol tartrate  25 mg Oral BID  . oxyCODONE      . pantoprazole  40 mg Oral Daily  . potassium chloride SA  20 mEq Oral BID  . pregabalin  100 mg Oral TID  . ticagrelor  90 mg Oral BID  . traMADol  100 mg Oral BID    Family History    Family History  Problem Relation Age of Onset  . Heart disease Mother   . Prostate cancer Brother     Social History    Social History   Social History  . Marital status: Married    Spouse name: N/A  .  Number of children: 5  . Years of education: N/A   Occupational History  . 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 Smoking status: Former Smoker    Years: 15.00    Types: Cigarettes  . Smokeless tobacco: Former Systems developer    Quit date: 04/08/2015     Comment: 5 cig a week  . Alcohol use 0.0 oz/week     Comment: Couple 40 ounces daily until 2012  . Drug use: No  . Sexual activity: Not on file   Other Topics Concern  . Not on file   Social History Narrative  . No narrative on file     Review of Systems    General:  No chills, fever, night sweats or weight changes.  Cardiovascular:  See HPI Dermatological: No rash, lesions/masses Respiratory: No cough, dyspnea Urologic: No hematuria, dysuria Abdominal:   + nausea, vomiting, diarrhea, bright red blood per rectum, melena, or hematemesis Neurologic:  No visual changes, + wkns, changes in mental status. All other systems reviewed and are otherwise negative except as noted above.  Physical Exam    Blood pressure 111/85, pulse 86, temperature 97.8 F (36.6 C), temperature source Oral, resp. rate (!) 9, height 5\' 10"  (1.778 m), weight 219 lb 12.8 oz (99.7 kg), SpO2 97 %.  General: Pleasant AA male, NAD Psych: Normal affect. Neuro: Alert and oriented X 3. Moves all extremities spontaneously. HEENT: Normal  Neck: Supple without  bruits or JVD. Lungs:  Resp regular and unlabored, CTA. Heart: RRR no s3, s4, or murmurs. Abdomen: Soft, non-tender, non-distended, BS + x 4.  Extremities: No clubbing, cyanosis or edema. DP/PT/Radials 2+ and equal bilaterally.  Labs    Troponin [facility] of Care Test)  Recent Labs  12/03/15 1706  TROPIPOC 0.03    Recent Labs  12/03/15 1704 12/03/15 1957 12/04/15 0111 12/04/15 0400 12/04/15 0707  CKTOTAL 178  --   --   --   --   TROPONINI  --  0.06* 0.05* 0.06* 0.06*  0.06*   Lab Results  Component Value Date   WBC 7.2 12/04/2015   HGB 12.1 (L) 12/04/2015   HCT 37.4 (L) 12/04/2015   MCV 82.7 12/04/2015   PLT 230 12/04/2015    Recent Labs Lab 12/03/15 1410  12/04/15 0707  NA  --   < > 140  K  --   < > 3.1*  CL  --   < > 106  CO2  --   < > 26  BUN  --   < > <5*  CREATININE  --   < > 0.84  CALCIUM  --   < > 9.3  PROT 6.6  --   --   BILITOT 0.5  --   --   ALKPHOS 71  --   --   ALT 18  --   --   AST 18  --   --   GLUCOSE  --   < > 105*  < > = values in this interval not displayed. Lab Results  Component Value Date   CHOL 124 (L) 10/04/2015   HDL 39 (L) 10/04/2015   LDLCALC 68 10/04/2015   TRIG 85 10/04/2015   Lab Results  Component Value Date   DDIMER 0.27 12/03/2015     Radiology Studies    Dg Chest 2 View  Result Date: 12/03/2015 CLINICAL DATA:  Weakness and fatigue.  Chest pain and dyspnea. EXAM:  CHEST  2 VIEW COMPARISON:  09/12/2015; 07/15/2015; chest CT - 07/14/2025 FINDINGS: Grossly unchanged cardiac silhouette and mediastinal contours. Evaluation of the retrosternal clear space obscured secondary overlying soft tissues. No focal airspace opacities. No pleural effusion or pneumothorax. No evidence of edema. Spinal stimulator leads are seen within the mid thoracic spinal canal. Post lumbar paraspinal fusion, incompletely evaluated. IMPRESSION: No acute cardiopulmonary disease. Electronically Signed   By: Sandi Mariscal M.D.   On: 12/03/2015 16:02   Ct  Head Wo Contrast  Result Date: 12/04/2015 CLINICAL DATA:  60 y/o  M; weakness with onset yesterday. EXAM: CT HEAD WITHOUT CONTRAST TECHNIQUE: Contiguous axial images were obtained from the base of the skull through the vertex without intravenous contrast. COMPARISON:  08/24/2015 CT of the head. FINDINGS: Brain: No evidence of acute infarction, hemorrhage, hydrocephalus, extra-axial collection or mass lesion/mass effect. Vascular: No hyperdense vessel or unexpected calcification. Skull: Normal. Negative for fracture or focal lesion. Sinuses/Orbits: Mild diffuse paranasal sinus mucosal thickening. Orbits are unremarkable. Other: None. IMPRESSION: 1. No acute intracranial abnormality is identified. 2. Mild paranasal sinus disease. 3. Otherwise unremarkable CT of head for age. Electronically Signed   By: Kristine Garbe M.D.   On: 12/04/2015 01:44   Ct Angio Chest Aorta W/cm &/or Wo/cm  Result Date: 12/04/2015 CLINICAL DATA:  Chest pain and shortness of breath. EXAM: CT ANGIOGRAPHY CHEST WITH CONTRAST TECHNIQUE: Multidetector CT imaging of the chest was performed using the standard protocol during bolus administration of intravenous contrast. Multiplanar CT image reconstructions and MIPs were obtained to evaluate the vascular anatomy. CONTRAST:  100 cc Isovue 370 IV COMPARISON:  Radiographs yesterday.  Chest CT 07/15/2015 FINDINGS: Cardiovascular: Preferential opacification of the thoracic aorta. No evidence of thoracic aortic aneurysm or dissection. No filling defects in the pulmonary arteries to the distal lobar level. Normal heart size. No pericardial effusion, physiologic fluid within the pericardial recess. Coronary artery calcifications or stents. Mediastinum/Nodes: Small mediastinal nodes are not enlarged by size criteria. No hilar adenopathy. The esophagus is decompressed. Thyroid gland is normal. Lungs/Pleura: Mild apical predominant emphysema. Diminished bronchial thickening from prior.  Streaky lower lobe opacities consistent with atelectasis. Tiny subpleural nodule in the right middle lobe image 62 series 7 is unchanged. No pleural fluid. Upper Abdomen: No acute abnormality. Musculoskeletal: Spinal stimulator in place with mild degenerative change in the spine. There are no acute or suspicious osseous abnormalities. Review of the MIP images confirms the above findings. IMPRESSION: 1. No aortic dissection or acute vascular abnormality. Coronary artery calcifications. 2. Mild emphysema.  Streaky lower lobe atelectasis. Electronically Signed   By: Jeb Levering M.D.   On: 12/04/2015 01:49    ECG & Cardiac Imaging    EKG: SR with no acute changes since previous tracings.   Echo: 7/17  Study Conclusions  - Left ventricle: The cavity size was mildly dilated. Wall   thickness was normal. Systolic function was mildly reduced. The   estimated ejection fraction was in the range of 45% to 50%. There   is hypokinesis of the basalinferior myocardium. Doppler   parameters are consistent with abnormal left ventricular   relaxation (grade 1 diastolic dysfunction). - Aortic root: The aortic root was mildly dilated. - Right ventricle: The cavity size was mildly dilated. Systolic   function was moderately reduced. - Right atrium: The atrium was mildly dilated.  Impressions:  - Limited study to assess LV function. Hypokinesis of the basal   inferior wall with overall mildly reduced LV systolic function;  grade 1 diastolic dysfunction; RAE/RVE with reduced RV function.   Compared to 07/15/15, LV function may be slightly worse.   Assessment & Plan    60 yo male with PMH of CAD s/p DES to pRCA, DM II, HTN, HLD and chronic back pain who presented to the [facility] ED after experiencing a near syncopal episode while at cardiac rehab.   1. Near syncope/bradycardia: Reports yesterday experiencing a near syncope episode while walking on the treadmill at cardiac rehab, and then again  today while sitting in bed attempting to use the urinal. Episode today with noted bradycardia on telemetry with rate into the 30s. No pauses or blocks noted. Also had significant hypotension with event. Received IV fluids with vital signs improved.  -- Did experience associated nausea, diaphoresis and weakness. Of note did receive home blood pressure medications including chlorthalidone, cardizem, metoprolol with IV nitro and PO lasix. Blood pressure appears to be well controlled, would consider d/c cardizem and hold chorthalidone if planning to continue with PO lasix. There was question of 5lb weight gain, but does not appear volume overloaded on exam.  -- May need to reduce BB given symptomatic bradycardia noted today.   2. Chest pain: Reports intermittent left sided chest pain throughout admission. CTA chest and d-dimer were negative. Was initially started on IV nitro, but has since been dc'ed 2/2 above. States he has been compliant with ASA and Brilinta. Does reports increase dyspnea with activity over the past couple of weeks? Last relook cath in 6/17 showed widely patent DES to pRCA. May benefit from stress testing? Trop with flat nondiagnostic trend. EKG non-acute.   3. HTN: Appears controlled. Recommendations as noted above.   Barnet Pall, NP-C Pager [phone] 12/04/2015, 3:26 PM

## 2015-12-04 NOTE — Progress Notes (Signed)
I was asked to reassess this patient upon arrival to the floor for increased weakness, lethargy, and near syncope upon arrival to the floor.  Rapid Response was called.  Hemodynamics remain stable, but the patient is disoriented to month and year.  He knows where he is and why he has been admitted, and he can tell me who the [place] is.  He is still complaining of constant left chest, shoulder pain but intensity improved from 7 out of 10 to 5 out of 10.  He has reduced active ROM in his left arm but grip strength in both hands symmetric bilaterally.  Strength in bilateral lower extremities still appears symmetric bilaterally, though he is generally weak.  Nitroglycerin drip hanging from the ED but heparin was never hung as ordered.  Holding anticoagulation for now.  STAT head CT and CT angio of the chest ordered.  Low index of suspicion for PE but need to rule out other pathology like thoracic aneurysm, dissection.

## 2015-12-04 NOTE — Progress Notes (Addendum)
Pt arrived on the floor 2340 very lethargic, drowsy, eyes gauzed, unable to hold himself up in sitting position, having near syncope episodes, pt alert to person, situation, location, disoriented to time, right side more weak than left upon assessment, RRT and admitting MD called  for further assessment , pt on nitro gtt running at 15 mcg/min titrated to 28mcg/min as per RRT verbal order. Rec'd verbal order not to titrate nitro gtt further, pt complaining of left shoulder pain radiating towards left flank 5/10 on admission. STAT EKG was done before RRT arrived showed changes in QRS complex . Pt was taken down for STAT CTA and CT head. Heparin gtt was started at 16 ml/hr and  heparin bolus not given as per MD Eulas Post telephonic order.

## 2015-12-04 NOTE — Progress Notes (Signed)
ANTICOAGULATION CONSULT NOTE - Initial Consult  Pharmacy Consult for Heparin Indication: chest pain/ACS  Allergies  Allergen Reactions  . Toradol [Ketorolac Tromethamine] Anaphylaxis    bp dropped, diaphoresis  . Methylprednisolone Other (See Comments)    B/P dropped and diorphoresis  . Prednisone Other (See Comments)    B/P dropped and patient began to sweat  . Adhesive [Tape] Rash and Other (See Comments)    "blisters" ( NO SURGICAL TAPE, PLEASE)  . Other Other (See Comments)    Allergic reaction to steroid (Prednisone??) Unsure of name B/P dropped and patient began to sweat    Patient Measurements: Height: 5\' 10"  (177.8 cm) Weight: 219 lb 12.8 oz (99.7 kg) IBW/kg (Calculated) : 73 Heparin Dosing Weight: 94kg  Vital Signs: Temp: 97.8 F (36.6 C) (10/10 0759) Temp Source: Oral (10/10 0759) BP: 98/82 (10/10 0759) Pulse Rate: 86 (10/10 0341)  Labs:  Recent Labs  12/03/15 1450 12/03/15 1704  12/03/15 1957 12/04/15 0111 12/04/15 0400 12/04/15 0707 12/04/15 1037  HGB 12.1*  --   --   --   --  11.8* 12.1*  --   HCT 36.6*  --   --   --   --  36.3* 37.4*  --   PLT 230  --   --   --   --  223 230  --   APTT  --   --   --  30  --   --   --   --   LABPROT  --   --   --  13.1  --   --   --   --   INR  --   --   --  0.99  --   --   --   --   HEPARINUNFRC  --   --   --   --   --  0.15*  --  0.45  CREATININE 1.01  --   --   --   --   --  0.84  --   CKTOTAL  --  178  --   --   --   --   --   --   TROPONINI  --   --   < > 0.06* 0.05* 0.06* 0.06*  0.06*  --   < > = values in this interval not displayed.  Estimated Creatinine Clearance: 110.7 mL/min (by C-G formula based on SCr of 0.84 mg/dL).  Assessment: 60yo s/p DES to RCA in May 2017 presents from cardiac rehab today with chest pain. Initial troponin elevated.   PMH: NSTEMI in May s/p DES to RCA, ICM, HTN, HLD, DM  Anticoag: Heparin for r/o ACS, none PTA  Nephro: SCr 0.84   Heme/Onc: H&H 12.1/37.4, Plt  230  Goal of Therapy:  Heparin level 0.3-0.7 units/ml Monitor platelets by anticoagulation protocol: Yes   Plan:  Continue heparin 1600 units/hr Daily heparin level and CBC F/U Cards plans  Levester Fresh, PharmD, BCPS, [facility] Clinical Pharmacist Pager [phone] 12/04/2015 11:30 AM

## 2015-12-04 NOTE — Progress Notes (Signed)
Patient called RN to room, patient nauseous and diaphoretic. HR 38 and blood pressure 63/41. Rapid response called. Nitroglycerin gtt stopped. Patient placed in the trend position. NS fluid bolus started. RR nurse in to see patient. Bernadette Hoit MD paged and cardiologist paged. Patient started to feel better and able to sit up in bed. Cardiology called back and coming to consult on patient. Will continue to monitor patient.

## 2015-12-05 ENCOUNTER — Encounter (HOSPITAL_COMMUNITY): Payer: Medicare Other

## 2015-12-05 ENCOUNTER — Encounter (HOSPITAL_COMMUNITY): Admission: RE | Admit: 2015-12-05 | Payer: Medicare Other | Source: Ambulatory Visit

## 2015-12-05 DIAGNOSIS — I25110 Atherosclerotic heart disease of native coronary artery with unstable angina pectoris: Secondary | ICD-10-CM

## 2015-12-05 DIAGNOSIS — E876 Hypokalemia: Secondary | ICD-10-CM

## 2015-12-05 LAB — CBC
HCT: 37.6 % — ABNORMAL LOW (ref 39.0–52.0)
Hemoglobin: 12.4 g/dL — ABNORMAL LOW (ref 13.0–17.0)
MCH: 26.8 pg (ref 26.0–34.0)
MCHC: 33 g/dL (ref 30.0–36.0)
MCV: 81.4 fL (ref 78.0–100.0)
Platelets: 233 10*3/uL (ref 150–400)
RBC: 4.62 MIL/uL (ref 4.22–5.81)
RDW: 17.6 % — ABNORMAL HIGH (ref 11.5–15.5)
WBC: 10.4 10*3/uL (ref 4.0–10.5)

## 2015-12-05 LAB — BASIC METABOLIC PANEL
Anion gap: 12 (ref 5–15)
BUN: 9 mg/dL (ref 6–20)
CO2: 27 mmol/L (ref 22–32)
Calcium: 9.1 mg/dL (ref 8.9–10.3)
Chloride: 100 mmol/L — ABNORMAL LOW (ref 101–111)
Creatinine, Ser: 1.28 mg/dL — ABNORMAL HIGH (ref 0.61–1.24)
GFR calc Af Amer: >60 mL/min (ref >60)
GFR calc non Af Amer: 59 mL/min — ABNORMAL LOW (ref >60)
Glucose, Bld: 103 mg/dL — ABNORMAL HIGH (ref 65–99)
Potassium: 3.1 mmol/L — ABNORMAL LOW (ref 3.5–5.1)
Sodium: 139 mmol/L (ref 135–145)

## 2015-12-05 LAB — HEMOGLOBIN A1C
Hgb A1c MFr Bld: 6.8 % — ABNORMAL HIGH (ref 4.8–5.6)
Mean Plasma Glucose: 148 mg/dL

## 2015-12-05 LAB — GLUCOSE, CAPILLARY
Glucose-Capillary: 87 mg/dL (ref 65–99)
Glucose-Capillary: 70 mg/dL (ref 65–99)

## 2015-12-05 LAB — MAGNESIUM: Magnesium: 1.7 mg/dL (ref 1.7–2.4)

## 2015-12-05 MED ORDER — POTASSIUM CHLORIDE CRYS ER 20 MEQ PO TBCR
20.0000 meq | EXTENDED_RELEASE_TABLET | Freq: Every day | ORAL | Status: DC
  Administered 2015-12-05: 20 meq via ORAL
  Filled 2015-12-05: qty 1

## 2015-12-05 MED ORDER — FUROSEMIDE 40 MG PO TABS: 40.0000 mg | ORAL_TABLET | Freq: Every day | ORAL | 0 refills | Status: DC | PRN

## 2015-12-05 MED ORDER — LISINOPRIL 20 MG PO TABS
10.0000 mg | ORAL_TABLET | Freq: Every day | ORAL | Status: DC
  Administered 2015-12-05: 20 mg via ORAL
  Filled 2015-12-05: qty 1

## 2015-12-05 MED ORDER — CYCLOBENZAPRINE HCL 10 MG PO TABS: 10.0000 mg | ORAL_TABLET | Freq: Three times a day (TID) | ORAL | Status: DC | PRN

## 2015-12-05 MED ORDER — DIAZEPAM 5 MG PO TABS: 10.0000 mg | ORAL_TABLET | Freq: Every day | ORAL | Status: DC

## 2015-12-05 MED ORDER — LISINOPRIL 10 MG PO TABS: 10.0000 mg | ORAL_TABLET | Freq: Every day | ORAL | 0 refills | Status: DC

## 2015-12-05 MED ORDER — POTASSIUM CHLORIDE CRYS ER 20 MEQ PO TBCR: 20.0000 meq | EXTENDED_RELEASE_TABLET | Freq: Every day | ORAL | 0 refills | Status: DC | PRN

## 2015-12-05 NOTE — Discharge Summary (Signed)
Physician Discharge Summary  Thomas Mcclure [id number] DOB: Dec 11, 1955 DOA: 12/03/2015  PCP: [name], MD  Admit date: 12/03/2015 Discharge date: 12/05/2015  Admitted From:  Home Disposition:  Home   Recommendations for Outpatient Follow-up:  1. Follow up with PCP in 1-2 weeks 2. Patient medications were modified, lisinopril was decreased to 10 mg daily, metoprolol and diltiazem have been discontinued. Furosemide and potassium supplements were changed to as needed for dyspnea or edema.  Home Health: yes Equipment/Devices: NO    Discharge Condition: Stable CODE STATUS: full  Diet recommendation: Heart Healthy / Carb Modified   Brief/Interim Summary: This is a 60 year old gentleman who has history of coronary disease status post non-ST elevation myocardial infarction in May. In June he was readmitted, he was found to have LV aneurysm. Patient has been on cardiac rehabilitation. On the day of admission while doing rehabilitation he developed a near syncope episode. He did reported chest pain and dyspnea, associated with this near syncope episode. Over last 2 weeks he has been developing worsening dyspnea on exertion. On his initial physical examination blood pressure was 151/99, heart rate 62, respiratory 16, oxygen saturation 97%. His sodium 139, potassium 3.2, creatinine 1.01, BUN 9, glucose 86. White count 8.4, hemoglobin 12.1, hematocrit 36.6, platelets 230. D-dimer 0.27. His electrocardiogram was sinus bradycardia rate of 60 bpm.  Patient was admitted to the hospital with the working diagnosis of chest pain concerning for unstable angina, complicated by bradycardia and near syncope episode.  1. Chest pain/ coronary artery disease. Patient was placed on a telemetry monitor, cardiac enzymes were trended. Troponin remained flat 0.05-0.06. Patient ruled out for acute coronary syndrome/ unstable angina. He was continued on Brilinta, aspirin, statin.  2. Near syncope. He was presumed to  be related to bradycardia which was medication induced, diltiazem and metoprolol. Atrial-Ventricular blocking agents were held with improvement of patient's symptoms and heart rate. Patient will have echocardiogram was outpatient. His last evaluation his left ventricle ejection fraction improved from 35-40% to 45-50%.   3. Systolic heart failure with ischemic cardiomyopathy. It is presumed that the patient's LV function has continued to improve. At this point will change furosemide to take only as needed. AV blocking agents were held. His lisinopril was decreased from 20-10 mg daily to avoid hypotension. He should have a follow-up as an outpatient.  4. Type 2 diabetes mellitus. Patient was placed on insulin sliding-scale. Metformin will be resumed by the time of discharge.  5. Hypokalemia. Potassium was corrected. We'll use furosemide only as needed.  6. Chronic back pain. Vision was continual pain control per his home regimen.  Discharge Diagnoses:  Principal Problem:   Chest pain Active Problems:   History of non-ST elevation myocardial infarction (NSTEMI)   Hypertension   Hyperlipidemia   Chronic back pain   Diabetes mellitus ([place])   CAD (coronary artery disease)   Hypokalemia   Unstable angina (HCC)   Left ventricular aneurysm   Near syncope   Bradycardia    Discharge Instructions  Discharge Instructions    Diet - low sodium heart healthy    Complete by:  As directed    Discharge instructions    Complete by:  As directed    Please follow with primary care in 7 days.   Increase activity slowly    Complete by:  As directed        Medication List    STOP taking these medications   chlorthalidone 25 MG tablet Commonly known as:  HYGROTON  diltiazem 180 MG 24 hr capsule Commonly known as:  DILACOR XR   feeding supplement (ENSURE ENLIVE) Liqd   metoprolol tartrate 25 MG tablet Commonly known as:  LOPRESSOR     TAKE these medications   aspirin EC 81 MG  tablet Take 1 tablet (81 mg total) by mouth daily.   atorvastatin 80 MG tablet Commonly known as:  LIPITOR Take 1 tablet (80 mg total) by mouth daily at 6 PM.   cyclobenzaprine 10 MG tablet Commonly known as:  FLEXERIL Take 10 mg by mouth 3 (three) times daily as needed for muscle spasms.   diazepam 10 MG tablet Commonly known as:  VALIUM Take 10 mg by mouth 2 (two) times daily. For spasms   DULoxetine 60 MG capsule Commonly known as:  CYMBALTA Take 60 mg by mouth daily.   furosemide 40 MG tablet Commonly known as:  LASIX Take 1 tablet (40 mg total) by mouth daily as needed for fluid or edema (take as needed for swelling or shortness of breath). What changed:  when to take this  reasons to take this   lisinopril 10 MG tablet Commonly known as:  PRINIVIL,ZESTRIL Take 1 tablet (10 mg total) by mouth daily. Start taking on:  12/06/2015 What changed:  medication strength  how much to take  when to take this   metFORMIN 750 MG 24 hr tablet Commonly known as:  GLUCOPHAGE-XR Take 750 mg by mouth daily with supper.   multivitamin with minerals Tabs tablet Take 1 tablet by mouth daily.   nitroGLYCERIN 0.4 MG SL tablet Commonly known as:  NITROSTAT Place 1 tablet (0.4 mg total) under the tongue every 5 (five) minutes as needed for chest pain.   omeprazole 40 MG capsule Commonly known as:  PRILOSEC Take 40 mg by mouth daily.   Oxycodone HCl 10 MG Tabs Take 10 mg by mouth every 4 (four) hours as needed (pain).   polyethylene glycol powder powder Commonly known as:  GLYCOLAX/MIRALAX Take 17 g by mouth daily as needed (constipation). Mix in 8 oz water, juice, soda, coffee or tea and drink   potassium chloride SA 20 MEQ tablet Commonly known as:  K-DUR,KLOR-CON Take 1 tablet (20 mEq total) by mouth daily as needed (take only with furosemide (lasix)). What changed:  when to take this  reasons to take this   pregabalin 100 MG capsule Commonly known as:   LYRICA Take 100 mg by mouth 3 (three) times daily.   ticagrelor 90 MG Tabs tablet Commonly known as:  BRILINTA Take 1 tablet (90 mg total) by mouth 2 (two) times daily.   topiramate 50 MG tablet Commonly known as:  TOPAMAX Take 50 mg by mouth 2 (two) times daily as needed (nerve pain).   traMADol 50 MG tablet Commonly known as:  ULTRAM Take 100 mg by mouth 2 (two) times daily.      Follow-up Information    [name], MD Follow up in 1 week(s).   Specialty:  Internal Medicine Contact information: [place] [address] [phone]          Allergies  Allergen Reactions  . Toradol [Ketorolac Tromethamine] Anaphylaxis    bp dropped, diaphoresis  . Methylprednisolone Other (See Comments)    B/P dropped and diorphoresis  . Prednisone Other (See Comments)    B/P dropped and patient began to sweat  . Adhesive [Tape] Rash and Other (See Comments)    "blisters" ( NO SURGICAL TAPE, PLEASE)  .  Other Other (See Comments)    Allergic reaction to steroid (Prednisone??) Unsure of name B/P dropped and patient began to sweat    Consultations: Cardiology   Procedures/Studies: Dg Chest 2 View  Result Date: 12/03/2015 CLINICAL DATA:  Weakness and fatigue.  Chest pain and dyspnea. EXAM: CHEST  2 VIEW COMPARISON:  09/12/2015; 07/15/2015; chest CT - 07/14/2025 FINDINGS: Grossly unchanged cardiac silhouette and mediastinal contours. Evaluation of the retrosternal clear space obscured secondary overlying soft tissues. No focal airspace opacities. No pleural effusion or pneumothorax. No evidence of edema. Spinal stimulator leads are seen within the mid thoracic spinal canal. Post lumbar paraspinal fusion, incompletely evaluated. IMPRESSION: No acute cardiopulmonary disease. Electronically Signed   By: Sandi Mariscal M.D.   On: 12/03/2015 16:02   Ct Head Wo Contrast  Result Date: 12/04/2015 CLINICAL DATA:  60 y/o  M; weakness with onset yesterday. EXAM: CT HEAD  WITHOUT CONTRAST TECHNIQUE: Contiguous axial images were obtained from the base of the skull through the vertex without intravenous contrast. COMPARISON:  08/24/2015 CT of the head. FINDINGS: Brain: No evidence of acute infarction, hemorrhage, hydrocephalus, extra-axial collection or mass lesion/mass effect. Vascular: No hyperdense vessel or unexpected calcification. Skull: Normal. Negative for fracture or focal lesion. Sinuses/Orbits: Mild diffuse paranasal sinus mucosal thickening. Orbits are unremarkable. Other: None. IMPRESSION: 1. No acute intracranial abnormality is identified. 2. Mild paranasal sinus disease. 3. Otherwise unremarkable CT of head for age. Electronically Signed   By: Kristine Garbe M.D.   On: 12/04/2015 01:44   Ct Angio Chest Aorta W/cm &/or Wo/cm  Result Date: 12/04/2015 CLINICAL DATA:  Chest pain and shortness of breath. EXAM: CT ANGIOGRAPHY CHEST WITH CONTRAST TECHNIQUE: Multidetector CT imaging of the chest was performed using the standard protocol during bolus administration of intravenous contrast. Multiplanar CT image reconstructions and MIPs were obtained to evaluate the vascular anatomy. CONTRAST:  100 cc Isovue 370 IV COMPARISON:  Radiographs yesterday.  Chest CT 07/15/2015 FINDINGS: Cardiovascular: Preferential opacification of the thoracic aorta. No evidence of thoracic aortic aneurysm or dissection. No filling defects in the pulmonary arteries to the distal lobar level. Normal heart size. No pericardial effusion, physiologic fluid within the pericardial recess. Coronary artery calcifications or stents. Mediastinum/Nodes: Small mediastinal nodes are not enlarged by size criteria. No hilar adenopathy. The esophagus is decompressed. Thyroid gland is normal. Lungs/Pleura: Mild apical predominant emphysema. Diminished bronchial thickening from prior. Streaky lower lobe opacities consistent with atelectasis. Tiny subpleural nodule in the right middle lobe image 62 series 7  is unchanged. No pleural fluid. Upper Abdomen: No acute abnormality. Musculoskeletal: Spinal stimulator in place with mild degenerative change in the spine. There are no acute or suspicious osseous abnormalities. Review of the MIP images confirms the above findings. IMPRESSION: 1. No aortic dissection or acute vascular abnormality. Coronary artery calcifications. 2. Mild emphysema.  Streaky lower lobe atelectasis. Electronically Signed   By: Jeb Levering M.D.   On: 12/04/2015 01:49       Subjective: Patient is feeling much better back to his baseline and S and shortness of breath, chest pain no dyspnea.  Discharge Exam: Vitals:   12/05/15 0844 12/05/15 1202  BP: 115/81 131/83  Pulse: 66 80  Resp: 18 14  Temp: 97.8 F (36.6 C) 97.6 F (36.4 C)   Vitals:   12/05/15 0314 12/05/15 0614 12/05/15 0844 12/05/15 1202  BP: 102/81  115/81 131/83  Pulse: 66  66 80  Resp: 14  18 14   Temp: 97.7 F (36.5 C)  97.8  F (36.6 C) 97.6 F (36.4 C)  TempSrc: Oral  Oral Oral  SpO2: 98%  98% 94%  Weight:  94.5 kg (208 lb 6.4 oz)    Height:        General: Pt is alert, awake, not in acute distress Cardiovascular: RRR, S1/S2 +, no rubs, no gallops Respiratory: CTA bilaterally, no wheezing, no rhonchi Abdominal: Soft, NT, ND, bowel sounds + Extremities: no edema, no cyanosis    The results of significant diagnostics from this hospitalization (including imaging, microbiology, ancillary and laboratory) are listed below for reference.     Microbiology: Recent Results (from the past 240 hour(s))  MRSA PCR Screening     Status: None   Collection Time: 12/03/15 11:42 PM  Result Value Ref Range Status   MRSA by PCR NEGATIVE NEGATIVE Final    Comment:        The GeneXpert MRSA Assay (FDA approved for NASAL specimens only), is one component of a comprehensive MRSA colonization surveillance program. It is not intended to diagnose MRSA infection nor to guide or monitor treatment for MRSA  infections.      Labs: BNP (last 3 results)  Recent Labs  12/03/15 1450  BNP [id number]   Basic Metabolic Panel:  Recent Labs Lab 12/03/15 1450 12/04/15 0707 12/05/15 0220  NA 139 140 139  K 3.2* 3.1* 3.1*  CL 105 106 100*  CO2 25 26 27   GLUCOSE 86 105* 103*  BUN 9 <5* 9  CREATININE 1.01 0.84 1.28*  CALCIUM 9.4 9.3 9.1  MG  --   --  1.7   Liver Function Tests:  Recent Labs Lab 12/03/15 1410  AST 18  ALT 18  ALKPHOS 71  BILITOT 0.5  PROT 6.6  ALBUMIN 4.0   No results for input(s): LIPASE, AMYLASE in the last 168 hours. No results for input(s): AMMONIA in the last 168 hours. CBC:  Recent Labs Lab 12/03/15 1450 12/04/15 0400 12/04/15 0707 12/05/15 0220  WBC 8.4 6.7 7.2 10.4  HGB 12.1* 11.8* 12.1* 12.4*  HCT 36.6* 36.3* 37.4* 37.6*  MCV 82.4 82.7 82.7 81.4  PLT 230 223 230 233   Cardiac Enzymes:  Recent Labs Lab 12/03/15 1704 12/03/15 1957 12/04/15 0111 12/04/15 0400 12/04/15 0707  CKTOTAL 178  --   --   --   --   TROPONINI  --  0.06* 0.05* 0.06* 0.06*  0.06*   BNP: Invalid input(s): POCBNP CBG:  Recent Labs Lab 12/04/15 1427 12/04/15 1538 12/04/15 2217 12/05/15 0842 12/05/15 1201  GLUCAP 99 131* 117* 87 70   D-Dimer  Recent Labs  12/03/15 1957  DDIMER 0.27   Hgb A1c  Recent Labs  12/04/15 0400  HGBA1C 6.8*   Lipid Profile No results for input(s): CHOL, HDL, LDLCALC, TRIG, CHOLHDL, LDLDIRECT in the last 72 hours. Thyroid function studies No results for input(s): TSH, T4TOTAL, T3FREE, THYROIDAB in the last 72 hours.  Invalid input(s): FREET3 Anemia work up No results for input(s): VITAMINB12, FOLATE, FERRITIN, TIBC, IRON, RETICCTPCT in the last 72 hours. Urinalysis    Component Value Date/Time   COLORURINE YELLOW 12/03/2015 1805   APPEARANCEUR CLEAR 12/03/2015 1805   LABSPEC 1.009 12/03/2015 1805   PHURINE 6.0 12/03/2015 1805   GLUCOSEU NEGATIVE 12/03/2015 1805   HGBUR NEGATIVE 12/03/2015 1805   BILIRUBINUR  NEGATIVE 12/03/2015 1805   KETONESUR NEGATIVE 12/03/2015 1805   PROTEINUR NEGATIVE 12/03/2015 1805   UROBILINOGEN 1.0 07/26/2012 1552   NITRITE NEGATIVE 12/03/2015 1805   LEUKOCYTESUR NEGATIVE 12/03/2015  1805   Sepsis Labs Invalid input(s): PROCALCITONIN,  WBC,  LACTICIDVEN Microbiology Recent Results (from the past 240 hour(s))  MRSA PCR Screening     Status: None   Collection Time: 12/03/15 11:42 PM  Result Value Ref Range Status   MRSA by PCR NEGATIVE NEGATIVE Final    Comment:        The GeneXpert MRSA Assay (FDA approved for NASAL specimens only), is one component of a comprehensive MRSA colonization surveillance program. It is not intended to diagnose MRSA infection nor to guide or monitor treatment for MRSA infections.      Time coordinating discharge: Over 45 minutes  SIGNED:   Tawni Millers, MD  [facility] 12/05/2015, 2:19 PM Pager   If 7PM-7AM, please contact night-coverage [email or web address] Password [email or web address]

## 2015-12-05 NOTE — Evaluation (Signed)
Physical Therapy Evaluation Patient Details Name: Thomas Mcclure MRN: [id number] DOB: 1955/03/21 Today's Date: 12/05/2015   History of Present Illness   Thomas Mcclure is a 60 y.o. gentleman with a history of CAD S/P NSTEMI in May (S/P DES to the RCA), ischemic cardiomyopathy (EF 45-50% with grade I diastolic dysfunction on echo in July), HTN, HLD, and DM who has had recurrent chest pain and shortness of breath since his NSTEMI in May.  He was actually re-admitted in June and had repeat left heart cath at that time.  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  He was admitted again at the beginning of July with complaints of fatigue and was diagnosed with dehydration, deconditioning S/P MI, and hypokalemia.  He was in the ED a second time in July for chest pain; he was evaluated by cardiology in the ED but was not admitted.  He has been participating in cardiac rehab.  Today, he had a near syncopal episode while walking around the track.  He reports left sided chest pain, dyspnea on exertion, nausea but no vomiting, and generalized weakness.   Clinical Impression  Patient evaluated by Physical Therapy with no further acute PT needs identified. All education has been completed and the patient has no further questions. Pt with 12 point drop in DBP from sitting to standing and then stable. Not symptomatic with drop. Discussed management of orthostatic hypotension. Ambulated >300' without AD independently. No acute needs at this time. See below for any follow-up Physical Therapy or equipment needs. PT is signing off. Thank you for this referral.     Follow Up Recommendations No PT follow up    Equipment Recommendations  None recommended by PT    Recommendations for Other Services       Precautions / Restrictions Precautions Precautions: Other (comment) Precaution Comments: watch BP Restrictions Weight Bearing Restrictions: No      Mobility  Bed  Mobility Overal bed mobility: Independent                Transfers Overall transfer level: Independent Equipment used: None                Ambulation/Gait Ambulation/Gait assistance: Independent Ambulation Distance (Feet): 300 Feet Assistive device: None Gait Pattern/deviations: Wide base of support Gait velocity: WFL Gait velocity interpretation: at or above normal speed for age/gender General Gait Details: pt ambulates with widened BOS and increased lateral sway due to chronic back pain but no LOB and this appears to be his baseline  Stairs            Wheelchair Mobility    Modified Rankin (Stroke Patients Only)       Balance Overall balance assessment: No apparent balance deficits (not formally assessed)                                           Pertinent Vitals/Pain Pain Assessment: 0-10 Pain Score: 6  Pain Location: back. had spinal stimulator on Pain Descriptors / Indicators: Aching Pain Intervention(s): Limited activity within patient's tolerance;Monitored during session    Home Living Family/patient expects to be discharged to:: Private residence Living Arrangements: Spouse/significant other Available Help at Discharge: Family;Available 24 hours/day Type of Home: House       Home Layout: One level Home Equipment: [place] - 2 wheels;Cane - single point;Shower seat Additional Comments: pt  is a Publishing copy and loves gospel music.      Prior Function Level of Independence: Independent with assistive device(s)         Comments: has most recently been using cane for mobility or no AD     Hand Dominance   Dominant Hand: Right    Extremity/Trunk Assessment   Upper Extremity Assessment: Overall WFL for tasks assessed           Lower Extremity Assessment: Overall WFL for tasks assessed      Cervical / Trunk Assessment: Normal  Communication   Communication: No difficulties  Cognition Arousal/Alertness:  Awake/alert Behavior During Therapy: WFL for tasks assessed/performed Overall Cognitive Status: Within Functional Limits for tasks assessed                      General Comments General comments (skin integrity, edema, etc.): BP sitting 117/96, standing 113/84, standing at 1 min 131/83, standing at 3 mins 114/81 Pt not symptomatic. Discussed precautions for OH as well as mgmt    Exercises     Assessment/Plan    PT Assessment Patent does not need any further PT services  PT Problem List            PT Treatment Interventions      PT Goals (Current goals can be found in the Care Plan section)  Acute Rehab PT Goals Patient Stated Goal: return home today PT Goal Formulation: All assessment and education complete, DC therapy    Frequency     Barriers to discharge        Co-evaluation               End of Session Equipment Utilized During Treatment: Gait belt Activity Tolerance: Patient tolerated treatment well Patient left: in bed;with call bell/phone within reach;with family/visitor present Nurse Communication: Mobility status         Time: [id number] PT Time Calculation (min) (ACUTE ONLY): 18 min   Charges:   PT Evaluation $PT Eval Moderate Complexity: 1 Procedure     PT G Codes:      Thomas Mcclure, PT  Acute Rehab Services  [place], [place] 12/05/2015, 1:10 PM

## 2015-12-05 NOTE — Progress Notes (Signed)
DAILY PROGRESS NOTE  Subjective:  "I feel like I can run down the hallway". Blood pressure remained low normal overnight - held several BP meds for today, therefore expect it to rebound. HR stable in the 60's overnight.  Reported he only takes valium at night, not scheduled during the day. Wants to go home.  Objective:  Temp:  [97.5 F (36.4 C)-98.1 F (36.7 C)] 97.8 F (36.6 C) (10/11 0844) Pulse Rate:  [63-69] 66 (10/11 0844) Resp:  [9-18] 18 (10/11 0844) BP: (95-115)/(67-85) 115/81 (10/11 0844) SpO2:  [95 %-98 %] 98 % (10/11 0844) Weight:  [208 lb 6.4 oz (94.5 kg)] 208 lb 6.4 oz (94.5 kg) (10/11 0614) Weight change: -15 lb 9.4 oz (-7.07 kg)  Intake/Output from previous day: 10/10 0701 - 10/11 0700 In: 678 [P.O.:600; I.V.:78] Out: 2150 [Urine:2150]  Intake/Output from this shift: Total I/O In: 360 [P.O.:360] Out: 550 [Urine:550]  Medications: No current facility-administered medications on file prior to encounter.    Current Outpatient Prescriptions on File Prior to Encounter  Medication Sig Dispense Refill  . aspirin EC 81 MG tablet Take 1 tablet (81 mg total) by mouth daily. 30 tablet 1  . atorvastatin (LIPITOR) 80 MG tablet Take 1 tablet (80 mg total) by mouth daily at 6 PM. 30 tablet 1  . cyclobenzaprine (FLEXERIL) 10 MG tablet Take 10 mg by mouth 3 (three) times daily as needed for muscle spasms.     . diazepam (VALIUM) 10 MG tablet Take 10 mg by mouth 2 (two) times daily. For spasms    . DULoxetine (CYMBALTA) 60 MG capsule Take 60 mg by mouth daily.    . furosemide (LASIX) 40 MG tablet Take 1 tablet (40 mg total) by mouth daily. 30 tablet 6  . lisinopril (PRINIVIL,ZESTRIL) 20 MG tablet Take 20 mg by mouth at bedtime.     . metFORMIN (GLUCOPHAGE-XR) 750 MG 24 hr tablet Take 750 mg by mouth daily with supper.    . metoprolol tartrate (LOPRESSOR) 25 MG tablet Take 1 tablet (25 mg total) by mouth 2 (two) times daily. 180 tablet 3  . Multiple Vitamin (MULTIVITAMIN  WITH MINERALS) TABS tablet Take 1 tablet by mouth daily.    . nitroGLYCERIN (NITROSTAT) 0.4 MG SL tablet Place 1 tablet (0.4 mg total) under the tongue every 5 (five) minutes as needed for chest pain. 25 tablet 6  . omeprazole (PRILOSEC) 40 MG capsule Take 40 mg by mouth daily.    . Oxycodone HCl 10 MG TABS Take 10 mg by mouth every 4 (four) hours as needed (pain).     . polyethylene glycol powder (GLYCOLAX/MIRALAX) powder Take 17 g by mouth daily as needed (constipation). Mix in 8 oz water, juice, soda, coffee or tea and drink    . potassium chloride SA (K-DUR,KLOR-CON) 20 MEQ tablet Take 20 mEq by mouth 2 (two) times daily.    . pregabalin (LYRICA) 100 MG capsule Take 100 mg by mouth 3 (three) times daily.     . ticagrelor (BRILINTA) 90 MG TABS tablet Take 1 tablet (90 mg total) by mouth 2 (two) times daily. 60 tablet 2  . topiramate (TOPAMAX) 50 MG tablet Take 50 mg by mouth 2 (two) times daily as needed (nerve pain).     . traMADol (ULTRAM) 50 MG tablet Take 100 mg by mouth 2 (two) times daily.     . feeding supplement, ENSURE ENLIVE, (ENSURE ENLIVE) LIQD Take 237 mLs by mouth 2 (two) times daily between meals. (  Patient not taking: Reported on 12/03/2015) 237 mL 12    Physical Exam: General appearance: alert and no distress Lungs: clear to auscultation bilaterally Heart: regular rate and rhythm, S1, S2 normal, no murmur, click, rub or gallop Neurologic: Grossly normal  Lab Results: Results for orders placed or performed during the hospital encounter of 12/03/15 (from the past 48 hour(s))  Hepatic function panel     Status: Abnormal   Collection Time: 12/03/15  2:10 PM  Result Value Ref Range   Total Protein 6.6 6.5 - 8.1 g/dL   Albumin 4.0 3.5 - 5.0 g/dL   AST 18 15 - 41 U/L   ALT 18 17 - 63 U/L   Alkaline Phosphatase 71 38 - 126 U/L   Total Bilirubin 0.5 0.3 - 1.2 mg/dL   Bilirubin, Direct <0.1 (L) 0.1 - 0.5 mg/dL   Indirect Bilirubin NOT CALCULATED 0.3 - 0.9 mg/dL  Basic  metabolic panel     Status: Abnormal   Collection Time: 12/03/15  2:50 PM  Result Value Ref Range   Sodium 139 135 - 145 mmol/L   Potassium 3.2 (L) 3.5 - 5.1 mmol/L   Chloride 105 101 - 111 mmol/L   CO2 25 22 - 32 mmol/L   Glucose, Bld 86 65 - 99 mg/dL   BUN 9 6 - 20 mg/dL   Creatinine, Ser 1.01 0.61 - 1.24 mg/dL   Calcium 9.4 8.9 - 10.3 mg/dL   GFR calc non Af Amer >60 >60 mL/min   GFR calc Af Amer >60 >60 mL/min    Comment: (NOTE) The eGFR has been calculated using the CKD EPI equation. This calculation has not been validated in all clinical situations. eGFR's persistently <60 mL/min signify possible Chronic Kidney Disease.    Anion gap 9 5 - 15  CBC     Status: Abnormal   Collection Time: 12/03/15  2:50 PM  Result Value Ref Range   WBC 8.4 4.0 - 10.5 K/uL   RBC 4.44 4.22 - 5.81 MIL/uL   Hemoglobin 12.1 (L) 13.0 - 17.0 g/dL   HCT 36.6 (L) 39.0 - 52.0 %   MCV 82.4 78.0 - 100.0 fL   MCH 27.3 26.0 - 34.0 pg   MCHC 33.1 30.0 - 36.0 g/dL   RDW 17.2 (H) 11.5 - 15.5 %   Platelets 230 150 - 400 K/uL  Brain natriuretic peptide     Status: None   Collection Time: 12/03/15  2:50 PM  Result Value Ref Range   B Natriuretic Peptide 54.7 0.0 - 100.0 pg/mL  CBG monitoring, ED     Status: None   Collection Time: 12/03/15  3:13 PM  Result Value Ref Range   Glucose-Capillary 96 65 - 99 mg/dL  CK     Status: None   Collection Time: 12/03/15  5:04 PM  Result Value Ref Range   Total CK 178 49 - 397 U/L  I-Stat Troponin, ED (not at [facility])     Status: None   Collection Time: 12/03/15  5:06 PM  Result Value Ref Range   Troponin i, poc 0.03 0.00 - 0.08 ng/mL   Comment 3            Comment: Due to the release kinetics of cTnI, a negative result within the first hours of the onset of symptoms does not rule out myocardial infarction with certainty. If myocardial infarction is still suspected, repeat the test at appropriate intervals.   Urinalysis, Routine w reflex microscopic  Status:  None   Collection Time: 12/03/15  6:05 PM  Result Value Ref Range   Color, Urine YELLOW YELLOW   APPearance CLEAR CLEAR   Specific Gravity, Urine 1.009 1.005 - 1.030   pH 6.0 5.0 - 8.0   Glucose, UA NEGATIVE NEGATIVE mg/dL   Hgb urine dipstick NEGATIVE NEGATIVE   Bilirubin Urine NEGATIVE NEGATIVE   Ketones, ur NEGATIVE NEGATIVE mg/dL   Protein, ur NEGATIVE NEGATIVE mg/dL   Nitrite NEGATIVE NEGATIVE   Leukocytes, UA NEGATIVE NEGATIVE    Comment: MICROSCOPIC NOT DONE ON URINES WITH NEGATIVE PROTEIN, BLOOD, LEUKOCYTES, NITRITE, OR GLUCOSE <1000 mg/dL.  Troponin I     Status: Abnormal   Collection Time: 12/03/15  7:57 PM  Result Value Ref Range   Troponin I 0.06 (HH) <0.03 ng/mL    Comment: CRITICAL RESULT CALLED TO, READ BACK BY AND VERIFIED WITH: C HUGHES,RN [id number] 12/03/2015 WBOND   Protime-INR     Status: None   Collection Time: 12/03/15  7:57 PM  Result Value Ref Range   Prothrombin Time 13.1 11.4 - 15.2 seconds   INR 0.99   APTT     Status: None   Collection Time: 12/03/15  7:57 PM  Result Value Ref Range   aPTT 30 24 - 36 seconds  D-dimer, quantitative (not at [facility])     Status: None   Collection Time: 12/03/15  7:57 PM  Result Value Ref Range   D-Dimer, Quant 0.27 0.00 - 0.50 ug/mL-FEU    Comment: (NOTE) At the manufacturer cut-off of 0.50 ug/mL FEU, this assay has been documented to exclude PE with a sensitivity and negative predictive value of 97 to 99%.  At this time, this assay has not been approved by the FDA to exclude DVT/VTE. Results should be correlated with clinical presentation.   Glucose, capillary     Status: None   Collection Time: 12/03/15 11:37 PM  Result Value Ref Range   Glucose-Capillary 87 65 - 99 mg/dL  MRSA PCR Screening     Status: None   Collection Time: 12/03/15 11:42 PM  Result Value Ref Range   MRSA by PCR NEGATIVE NEGATIVE    Comment:        The GeneXpert MRSA Assay (FDA approved for NASAL specimens only), is one component of  a comprehensive MRSA colonization surveillance program. It is not intended to diagnose MRSA infection nor to guide or monitor treatment for MRSA infections.   Troponin I     Status: Abnormal   Collection Time: 12/04/15  1:11 AM  Result Value Ref Range   Troponin I 0.05 (HH) <0.03 ng/mL    Comment: CRITICAL VALUE NOTED.  VALUE IS CONSISTENT WITH PREVIOUSLY REPORTED AND CALLED VALUE.  Heparin level (unfractionated)     Status: Abnormal   Collection Time: 12/04/15  4:00 AM  Result Value Ref Range   Heparin Unfractionated 0.15 (L) 0.30 - 0.70 IU/mL    Comment:        IF HEPARIN RESULTS ARE BELOW EXPECTED VALUES, AND PATIENT DOSAGE HAS BEEN CONFIRMED, SUGGEST FOLLOW UP TESTING OF ANTITHROMBIN III LEVELS.   CBC     Status: Abnormal   Collection Time: 12/04/15  4:00 AM  Result Value Ref Range   WBC 6.7 4.0 - 10.5 K/uL   RBC 4.39 4.22 - 5.81 MIL/uL   Hemoglobin 11.8 (L) 13.0 - 17.0 g/dL   HCT 36.3 (L) 39.0 - 52.0 %   MCV 82.7 78.0 - 100.0 fL   MCH 26.9 26.0 -  34.0 pg   MCHC 32.5 30.0 - 36.0 g/dL   RDW 17.2 (H) 11.5 - 15.5 %   Platelets 223 150 - 400 K/uL  Troponin I     Status: Abnormal   Collection Time: 12/04/15  4:00 AM  Result Value Ref Range   Troponin I 0.06 (HH) <0.03 ng/mL    Comment: CRITICAL VALUE NOTED.  VALUE IS CONSISTENT WITH PREVIOUSLY REPORTED AND CALLED VALUE.  Hemoglobin A1c     Status: Abnormal   Collection Time: 12/04/15  4:00 AM  Result Value Ref Range   Hgb A1c MFr Bld 6.8 (H) 4.8 - 5.6 %    Comment: (NOTE)         Pre-diabetes: 5.7 - 6.4         Diabetes: >6.4         Glycemic control for adults with diabetes: <7.0    Mean Plasma Glucose 148 mg/dL    Comment: (NOTE) Performed At: [facility] [place], [place] [id number] Lindon Romp MD DX:[id number]   Urine rapid drug screen (hosp performed)     Status: Abnormal   Collection Time: 12/04/15  4:10 AM  Result Value Ref Range   Opiates POSITIVE (A) NONE DETECTED    Cocaine NONE DETECTED NONE DETECTED   Benzodiazepines POSITIVE (A) NONE DETECTED   Amphetamines NONE DETECTED NONE DETECTED   Tetrahydrocannabinol NONE DETECTED NONE DETECTED   Barbiturates NONE DETECTED NONE DETECTED    Comment:        DRUG SCREEN FOR MEDICAL PURPOSES ONLY.  IF CONFIRMATION IS NEEDED FOR ANY PURPOSE, NOTIFY LAB WITHIN 5 DAYS.        LOWEST DETECTABLE LIMITS FOR URINE DRUG SCREEN Drug Class       Cutoff (ng/mL) Amphetamine      1000 Barbiturate      200 Benzodiazepine   209 Tricyclics       470 Opiates          300 Cocaine          300 THC              50   Troponin I     Status: Abnormal   Collection Time: 12/04/15  7:07 AM  Result Value Ref Range   Troponin I 0.06 (HH) <0.03 ng/mL    Comment: CRITICAL VALUE NOTED.  VALUE IS CONSISTENT WITH PREVIOUSLY REPORTED AND CALLED VALUE.  Troponin I     Status: Abnormal   Collection Time: 12/04/15  7:07 AM  Result Value Ref Range   Troponin I 0.06 (HH) <0.03 ng/mL    Comment: CRITICAL VALUE NOTED.  VALUE IS CONSISTENT WITH PREVIOUSLY REPORTED AND CALLED VALUE.  CBC     Status: Abnormal   Collection Time: 12/04/15  7:07 AM  Result Value Ref Range   WBC 7.2 4.0 - 10.5 K/uL   RBC 4.52 4.22 - 5.81 MIL/uL   Hemoglobin 12.1 (L) 13.0 - 17.0 g/dL   HCT 37.4 (L) 39.0 - 52.0 %   MCV 82.7 78.0 - 100.0 fL   MCH 26.8 26.0 - 34.0 pg   MCHC 32.4 30.0 - 36.0 g/dL   RDW 17.1 (H) 11.5 - 15.5 %   Platelets 230 150 - 400 K/uL  Basic metabolic panel     Status: Abnormal   Collection Time: 12/04/15  7:07 AM  Result Value Ref Range   Sodium 140 135 - 145 mmol/L   Potassium 3.1 (L) 3.5 - 5.1 mmol/L   Chloride  106 101 - 111 mmol/L   CO2 26 22 - 32 mmol/L   Glucose, Bld 105 (H) 65 - 99 mg/dL   BUN <5 (L) 6 - 20 mg/dL   Creatinine, Ser 0.84 0.61 - 1.24 mg/dL   Calcium 9.3 8.9 - 10.3 mg/dL   GFR calc non Af Amer >60 >60 mL/min   GFR calc Af Amer >60 >60 mL/min    Comment: (NOTE) The eGFR has been calculated using the CKD EPI  equation. This calculation has not been validated in all clinical situations. eGFR's persistently <60 mL/min signify possible Chronic Kidney Disease.    Anion gap 8 5 - 15  Glucose, capillary     Status: Abnormal   Collection Time: 12/04/15  7:57 AM  Result Value Ref Range   Glucose-Capillary 107 (H) 65 - 99 mg/dL  Heparin level (unfractionated)     Status: None   Collection Time: 12/04/15 10:37 AM  Result Value Ref Range   Heparin Unfractionated 0.45 0.30 - 0.70 IU/mL    Comment:        IF HEPARIN RESULTS ARE BELOW EXPECTED VALUES, AND PATIENT DOSAGE HAS BEEN CONFIRMED, SUGGEST FOLLOW UP TESTING OF ANTITHROMBIN III LEVELS.   Glucose, capillary     Status: Abnormal   Collection Time: 12/04/15 12:06 PM  Result Value Ref Range   Glucose-Capillary 116 (H) 65 - 99 mg/dL  Glucose, capillary     Status: None   Collection Time: 12/04/15  2:27 PM  Result Value Ref Range   Glucose-Capillary 99 65 - 99 mg/dL  Glucose, capillary     Status: Abnormal   Collection Time: 12/04/15  3:38 PM  Result Value Ref Range   Glucose-Capillary 131 (H) 65 - 99 mg/dL  Glucose, capillary     Status: Abnormal   Collection Time: 12/04/15 10:17 PM  Result Value Ref Range   Glucose-Capillary 117 (H) 65 - 99 mg/dL   Comment 1 Notify RN   CBC     Status: Abnormal   Collection Time: 12/05/15  2:20 AM  Result Value Ref Range   WBC 10.4 4.0 - 10.5 K/uL   RBC 4.62 4.22 - 5.81 MIL/uL   Hemoglobin 12.4 (L) 13.0 - 17.0 g/dL   HCT 37.6 (L) 39.0 - 52.0 %   MCV 81.4 78.0 - 100.0 fL   MCH 26.8 26.0 - 34.0 pg   MCHC 33.0 30.0 - 36.0 g/dL   RDW 17.6 (H) 11.5 - 15.5 %   Platelets 233 150 - 400 K/uL  Magnesium     Status: None   Collection Time: 12/05/15  2:20 AM  Result Value Ref Range   Magnesium 1.7 1.7 - 2.4 mg/dL  Basic metabolic panel     Status: Abnormal   Collection Time: 12/05/15  2:20 AM  Result Value Ref Range   Sodium 139 135 - 145 mmol/L   Potassium 3.1 (L) 3.5 - 5.1 mmol/L   Chloride 100 (L)  101 - 111 mmol/L   CO2 27 22 - 32 mmol/L   Glucose, Bld 103 (H) 65 - 99 mg/dL   BUN 9 6 - 20 mg/dL   Creatinine, Ser 1.28 (H) 0.61 - 1.24 mg/dL   Calcium 9.1 8.9 - 10.3 mg/dL   GFR calc non Af Amer 59 (L) >60 mL/min   GFR calc Af Amer >60 >60 mL/min    Comment: (NOTE) The eGFR has been calculated using the CKD EPI equation. This calculation has not been validated in all clinical situations. eGFR's  persistently <60 mL/min signify possible Chronic Kidney Disease.    Anion gap 12 5 - 15  Glucose, capillary     Status: None   Collection Time: 12/05/15  8:42 AM  Result Value Ref Range   Glucose-Capillary 87 65 - 99 mg/dL    Imaging: Dg Chest 2 View  Result Date: 12/03/2015 CLINICAL DATA:  Weakness and fatigue.  Chest pain and dyspnea. EXAM: CHEST  2 VIEW COMPARISON:  09/12/2015; 07/15/2015; chest CT - 07/14/2025 FINDINGS: Grossly unchanged cardiac silhouette and mediastinal contours. Evaluation of the retrosternal clear space obscured secondary overlying soft tissues. No focal airspace opacities. No pleural effusion or pneumothorax. No evidence of edema. Spinal stimulator leads are seen within the mid thoracic spinal canal. Post lumbar paraspinal fusion, incompletely evaluated. IMPRESSION: No acute cardiopulmonary disease. Electronically Signed   By: Sandi Mariscal M.D.   On: 12/03/2015 16:02   Ct Head Wo Contrast  Result Date: 12/04/2015 CLINICAL DATA:  60 y/o  M; weakness with onset yesterday. EXAM: CT HEAD WITHOUT CONTRAST TECHNIQUE: Contiguous axial images were obtained from the base of the skull through the vertex without intravenous contrast. COMPARISON:  08/24/2015 CT of the head. FINDINGS: Brain: No evidence of acute infarction, hemorrhage, hydrocephalus, extra-axial collection or mass lesion/mass effect. Vascular: No hyperdense vessel or unexpected calcification. Skull: Normal. Negative for fracture or focal lesion. Sinuses/Orbits: Mild diffuse paranasal sinus mucosal thickening. Orbits  are unremarkable. Other: None. IMPRESSION: 1. No acute intracranial abnormality is identified. 2. Mild paranasal sinus disease. 3. Otherwise unremarkable CT of head for age. Electronically Signed   By: Kristine Garbe M.D.   On: 12/04/2015 01:44   Ct Angio Chest Aorta W/cm &/or Wo/cm  Result Date: 12/04/2015 CLINICAL DATA:  Chest pain and shortness of breath. EXAM: CT ANGIOGRAPHY CHEST WITH CONTRAST TECHNIQUE: Multidetector CT imaging of the chest was performed using the standard protocol during bolus administration of intravenous contrast. Multiplanar CT image reconstructions and MIPs were obtained to evaluate the vascular anatomy. CONTRAST:  100 cc Isovue 370 IV COMPARISON:  Radiographs yesterday.  Chest CT 07/15/2015 FINDINGS: Cardiovascular: Preferential opacification of the thoracic aorta. No evidence of thoracic aortic aneurysm or dissection. No filling defects in the pulmonary arteries to the distal lobar level. Normal heart size. No pericardial effusion, physiologic fluid within the pericardial recess. Coronary artery calcifications or stents. Mediastinum/Nodes: Small mediastinal nodes are not enlarged by size criteria. No hilar adenopathy. The esophagus is decompressed. Thyroid gland is normal. Lungs/Pleura: Mild apical predominant emphysema. Diminished bronchial thickening from prior. Streaky lower lobe opacities consistent with atelectasis. Tiny subpleural nodule in the right middle lobe image 62 series 7 is unchanged. No pleural fluid. Upper Abdomen: No acute abnormality. Musculoskeletal: Spinal stimulator in place with mild degenerative change in the spine. There are no acute or suspicious osseous abnormalities. Review of the MIP images confirms the above findings. IMPRESSION: 1. No aortic dissection or acute vascular abnormality. Coronary artery calcifications. 2. Mild emphysema.  Streaky lower lobe atelectasis. Electronically Signed   By: Jeb Levering M.D.   On: 12/04/2015 01:49     Assessment:  1. Principal Problem: 2.   Chest pain 3. Active Problems: 4.   History of non-ST elevation myocardial infarction (NSTEMI) 5.   Hypertension 6.   Hyperlipidemia 7.   Chronic back pain 8.   Diabetes mellitus ([place]) 9.   CAD (coronary artery disease) 10.   Hypokalemia 11.   Unstable angina ([place]) 12.   Left ventricular aneurysm 13.   Near syncope 14.  Bradycardia 15.   Plan:  1. Feels much better today. No dizziness, fatigue, chest pain or dyspnea. Monitor overnight shows no further bradycardia. Continue aspirin, lipitor, lisinopril and Brillinta as cardiac meds. Ok to resume cardiac rehabilitation after discharge. Follow-up with midlevel provider at [facility] street office and eventually Dr. Daneen Schick his primary cardiologist. Consider repeat echo as an outpatient - suspect LV function has further improved.  No further suggestions at this time. Cardiology will sign-off. Call with questions.  Time Spent Directly with Patient:  15 minutes  Length of Stay:  LOS: 1 day   Pixie Casino, MD, [facility] Attending Cardiologist [place] 12/05/2015, 9:54 AM

## 2015-12-07 ENCOUNTER — Encounter (HOSPITAL_COMMUNITY): Payer: Medicare Other

## 2015-12-07 NOTE — Progress Notes (Signed)
Cardiac Individual Treatment Plan  Patient Details  Name: Thomas Mcclure MRN: [id number] Date of Birth: 16-May-1955 Referring Provider:   Flowsheet Row CARDIAC REHAB PHASE II ORIENTATION from 11/06/2015 in [facility]  Referring Provider  [name]      Initial Encounter Date:  [place] PHASE II ORIENTATION from 11/06/2015 in [facility]  Date  11/06/15  Referring Provider  [name]      Visit Diagnosis: NSTEMI (non-ST elevated myocardial infarction) ([place])  S/P coronary artery stent placement  Patient's Home Medications on Admission:  Current Outpatient Prescriptions:  .  aspirin EC 81 MG tablet, Take 1 tablet (81 mg total) by mouth daily., Disp: 30 tablet, Rfl: 1 .  atorvastatin (LIPITOR) 80 MG tablet, Take 1 tablet (80 mg total) by mouth daily at 6 PM., Disp: 30 tablet, Rfl: 1 .  cyclobenzaprine (FLEXERIL) 10 MG tablet, Take 10 mg by mouth 3 (three) times daily as needed for muscle spasms. , Disp: , Rfl:  .  diazepam (VALIUM) 10 MG tablet, Take 10 mg by mouth 2 (two) times daily. For spasms, Disp: , Rfl:  .  DULoxetine (CYMBALTA) 60 MG capsule, Take 60 mg by mouth daily., Disp: , Rfl:  .  furosemide (LASIX) 40 MG tablet, Take 1 tablet (40 mg total) by mouth daily as needed for fluid or edema (take as needed for swelling or shortness of breath)., Disp: 30 tablet, Rfl: 0 .  lisinopril (PRINIVIL,ZESTRIL) 10 MG tablet, Take 1 tablet (10 mg total) by mouth daily., Disp: 30 tablet, Rfl: 0 .  metFORMIN (GLUCOPHAGE-XR) 750 MG 24 hr tablet, Take 750 mg by mouth daily with supper., Disp: , Rfl:  .  Multiple Vitamin (MULTIVITAMIN WITH MINERALS) TABS tablet, Take 1 tablet by mouth daily., Disp: , Rfl:  .  nitroGLYCERIN (NITROSTAT) 0.4 MG SL tablet, Place 1 tablet (0.4 mg total) under the tongue every 5 (five) minutes as needed for chest pain., Disp: 25 tablet, Rfl: 6 .  omeprazole (PRILOSEC) 40 MG  capsule, Take 40 mg by mouth daily., Disp: , Rfl:  .  Oxycodone HCl 10 MG TABS, Take 10 mg by mouth every 4 (four) hours as needed (pain). , Disp: , Rfl:  .  polyethylene glycol powder (GLYCOLAX/MIRALAX) powder, Take 17 g by mouth daily as needed (constipation). Mix in 8 oz water, juice, soda, coffee or tea and drink, Disp: , Rfl:  .  potassium chloride SA (K-DUR,KLOR-CON) 20 MEQ tablet, Take 1 tablet (20 mEq total) by mouth daily as needed (take only with furosemide (lasix))., Disp: 30 tablet, Rfl: 0 .  pregabalin (LYRICA) 100 MG capsule, Take 100 mg by mouth 3 (three) times daily. , Disp: , Rfl:  .  ticagrelor (BRILINTA) 90 MG TABS tablet, Take 1 tablet (90 mg total) by mouth 2 (two) times daily., Disp: 60 tablet, Rfl: 2 .  topiramate (TOPAMAX) 50 MG tablet, Take 50 mg by mouth 2 (two) times daily as needed (nerve pain). , Disp: , Rfl:  .  traMADol (ULTRAM) 50 MG tablet, Take 100 mg by mouth 2 (two) times daily. , Disp: , Rfl:   Past Medical History: Past Medical History:  Diagnosis Date  . Arthritis    Rheumatoid  . Baker's cyst    Left calf  . CAD in native artery, 07/15/15 PCI of RCA with DES 07/16/2015   a.   NSTEMI 5/17: LHC - pLAD 20, pLCx 20, OM1 30, pRCA  100, EF 45-50%>> PCI: 2.5 x 24 mm Promus DES to RCA  //  b.   Echo 5/17: mild LVH, EF 50-55%, no RWMA, mod RVE //  c. LHC 6/17: pLAD 20, pLCx 20, OM1 50, pRCA stent ok, EF 35-45% with mod sized inf wall and basal segment aneurysm  . Chronic back pain   . Cyst (solitary) of breast    ? cyst left calf ,knee  . Diabetes mellitus    diet controlled  . GERD (gastroesophageal reflux disease)   . Hyperlipidemia   . Hypertension    Dr. [name] [phone]  . Ischemic cardiomyopathy    a. LV-gram at time of LHC in 6/17 with EF 35-45%  //  b. Echo 7/17: EF 45-50%, inferior HK, grade 1 diastolic dysfunction, mildly dilated aortic root, moderately reduced RVSF, mild RAE  . Myocardial infarction 2017  . 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 Former Smoker  . Years: 15.00  . Types: Cigarettes  Smokeless Tobacco  . Former Systems developer  . Quit date: 04/08/2015    Comment: 5 cig a week    Labs: Recent Review Flowsheet Data    Labs for ITP Cardiac and Pulmonary Rehab Latest Ref Rng & Units 10/07/2010 12/11/2012 07/15/2015 10/04/2015 12/04/2015   Cholestrol 125 - 200 mg/dL - - 149 124(L) -   LDLCALC <130 mg/dL - - 96 68 -   HDL >=40 mg/dL - - 37(L) 39(L) -   Trlycerides <150 mg/dL - - 82 85 -   Hemoglobin A1c 4.8 - 5.6 % - - 6.6(H) - 6.8(H)   TCO2 0 - 100 mmol/L 25 24 25  - -      Capillary Blood Glucose: Lab Results  Component Value Date   GLUCAP 70 12/05/2015   GLUCAP 87 12/05/2015   GLUCAP 117 (H) 12/04/2015   GLUCAP 131 (H) 12/04/2015   GLUCAP 99 12/04/2015     Exercise Target Goals:    Exercise Program Goal: Individual exercise prescription set with THRR, safety & activity barriers. Participant demonstrates ability to understand and report RPE using BORG scale, to self-measure pulse accurately, and to acknowledge the importance of the exercise prescription.  Exercise Prescription Goal: Starting with aerobic activity 30 plus minutes a day, 3 days per week for initial exercise prescription. Provide home exercise prescription and guidelines that participant acknowledges understanding prior to discharge.  Activity Barriers & Risk Stratification:     Activity Barriers & Cardiac Risk Stratification - 11/06/15 1512      Activity Barriers & Cardiac Risk Stratification   Activity Barriers Back Problems;Joint Problems;History of Falls;Balance Concerns;Assistive Device;Arthritis   Comments peripheral neuropathy, RA   Cardiac Risk Stratification High      6 Minute Walk:     6 Minute Walk    Row Name 11/06/15 1651         6 Minute Walk   Phase Initial     Distance 1286 feet     Walk Time 6 minutes     # of Rest Breaks 0     MPH 2.44     METS 3.12      RPE 11     VO2 Peak 10.91     Symptoms Yes (comment)     Comments C/o ,mild left hip pain     Resting HR 78 bpm     Resting BP 130/80     Max Ex. HR 85 bpm  Max Ex. BP 142/84     2 Minute Post BP 118/84        Initial Exercise Prescription:     Initial Exercise Prescription - 11/09/15 1600      Date of Initial Exercise RX and Referring Provider   Date 11/06/15   Referring Provider [name]     Bike   Level 1.1   Minutes 10   METs 3.08     NuStep   Level 2   Minutes 10   METs 2.5     Track   Laps 10   Minutes 10   METs 2.74     Prescription Details   Frequency (times per week) 3   Duration Progress to 30 minutes of continuous aerobic without signs/symptoms of physical distress     Intensity   THRR 40-80% of Max Heartrate 64-128   Ratings of Perceived Exertion 11-13   Perceived Dyspnea 0-4     Progression   Progression Continue to progress workloads to maintain intensity without signs/symptoms of physical distress.     Resistance Training   Training Prescription Yes   Weight 2lbs   Reps 10-12      Perform Capillary Blood Glucose checks as needed.  Exercise Prescription Changes:     Exercise Prescription Changes    Row Name 12/03/15 1600             Exercise Review   Progression Yes         Response to Exercise   Blood Pressure (Admit) 108/70       Blood Pressure (Exercise) 122/80       Blood Pressure (Exit) 114/64       Heart Rate (Admit) 87 bpm       Heart Rate (Exercise) 103 bpm       Heart Rate (Exit) 68 bpm       Rating of Perceived Exertion (Exercise) 11       Comments reviewed HEP on 11/19/15       Duration Progress to 30 minutes of continuous aerobic without signs/symptoms of physical distress       Intensity THRR unchanged         Progression   Progression Continue to progress workloads to maintain intensity without signs/symptoms of physical distress.       Average METs 2.7         Resistance Training    Training Prescription Yes       Weight 2lbs       Reps 10-12         Bike   Level 1.1       Minutes 10       METs 3.08         NuStep   Level 4       Minutes 10       METs 2.1         Track   Laps 10       Minutes 10       METs 2.74         Home Exercise Plan   Plans to continue exercise at [facility] on 11/19/15. See progress note       Frequency Add 2 additional days to program exercise sessions.          Exercise Comments:   Discharge Exercise Prescription (Final Exercise Prescription Changes):     Exercise Prescription Changes - 12/03/15 1600      Exercise Review  Progression Yes     Response to Exercise   Blood Pressure (Admit) 108/70   Blood Pressure (Exercise) 122/80   Blood Pressure (Exit) 114/64   Heart Rate (Admit) 87 bpm   Heart Rate (Exercise) 103 bpm   Heart Rate (Exit) 68 bpm   Rating of Perceived Exertion (Exercise) 11   Comments reviewed HEP on 11/19/15   Duration Progress to 30 minutes of continuous aerobic without signs/symptoms of physical distress   Intensity THRR unchanged     Progression   Progression Continue to progress workloads to maintain intensity without signs/symptoms of physical distress.   Average METs 2.7     Resistance Training   Training Prescription Yes   Weight 2lbs   Reps 10-12     Bike   Level 1.1   Minutes 10   METs 3.08     NuStep   Level 4   Minutes 10   METs 2.1     Track   Laps 10   Minutes 10   METs 2.74     Home Exercise Plan   Plans to continue exercise at [facility] on 11/19/15. See progress note   Frequency Add 2 additional days to program exercise sessions.      Nutrition:  Target Goals: Understanding of nutrition guidelines, daily intake of sodium 1500mg , cholesterol 200mg , calories 30% from fat and 7% or less from saturated fats, daily to have 5 or more servings of fruits and vegetables.  Biometrics:     Pre Biometrics - 11/06/15 1500      Pre Biometrics    Height 5' 10.25" (1.784 m)   Weight 223 lb 15.8 oz (101.6 kg)   Waist Circumference 44 inches   Hip Circumference 44 inches   Waist to Hip Ratio 1 %   BMI (Calculated) 32   Triceps Skinfold 8.5 mm   % Body Fat 28.2 %   Grip Strength 53 kg   Flexibility --  Not performed due to back issues.   Single Leg Stand 1.09 seconds       Nutrition Therapy Plan and Nutrition Goals:     Nutrition Therapy & Goals - 11/07/15 1409      Nutrition Therapy   Diet Carb Modified, Therapeutic Lifestyle Changes     Personal Nutrition Goals   Personal Goal #1 1-2 lb wt loss/week to a wt loss goal of 6-24 lb at graduation from cardiac rehab     [place], educate and counsel regarding individualized specific dietary modifications aiming towards targeted core components such as weight, hypertension, lipid management, diabetes, heart failure and other comorbidities.   Expected Outcomes Short Term Goal: Understand basic principles of dietary content, such as calories, fat, sodium, cholesterol and nutrients.;Long Term Goal: Adherence to prescribed nutrition plan.      Nutrition Discharge: Nutrition Scores:     Nutrition Assessments - 11/07/15 1409      MEDFICTS Scores   Pre Score 52      Nutrition Goals Re-Evaluation:   Psychosocial: Target Goals: Acknowledge presence or absence of depression, maximize coping skills, provide positive support system. Participant is able to verbalize types and ability to use techniques and skills needed for reducing stress and depression.  Initial Review & Psychosocial Screening:     Initial Psych Review & Screening - 11/12/15 [place]? Yes     Barriers   Psychosocial barriers to participate in program  The patient should benefit from training in stress management and relaxation.;There are no identifiable barriers or psychosocial needs.     Screening Interventions   Interventions  Encouraged to exercise      Quality of Life Scores:     Quality of Life - 11/06/15 1644      Quality of Life Scores   Health/Function Pre 25.29 %   Socioeconomic Pre 28.29 %   Psych/Spiritual Pre 30 %   Family Pre 26.4 %   GLOBAL Pre 27.09 %      PHQ-9: Recent Review Flowsheet Data    Depression screen [facility] 2/9 11/12/2015   Decreased Interest 0   Down, Depressed, Hopeless 0   PHQ - 2 Score 0      Psychosocial Evaluation and Intervention:     Psychosocial Evaluation - 11/12/15 1630      Psychosocial Evaluation & Interventions   Interventions Stress management education;Relaxation education;Encouraged to exercise with the program and follow exercise prescription   Continued Psychosocial Services Needed No  no psychosocial needs identified, no inteventions necessary       Psychosocial Re-Evaluation:     Psychosocial Re-Evaluation    [place] Name 12/07/15 1123             Psychosocial Re-Evaluation   Interventions Encouraged to attend Cardiac Rehabilitation for the exercise;Stress management education;Relaxation education       Comments pt does exhibit situational stress and anxiety involving interpersonal relationships. pt recent hospitalization resulted in medication adjustments to treat pt presyncopal symptoms.  pt encouraged to participate in stress management and relaxation education in addition to exercise for stress relief. pt has no psychosocial needs identified, no interventions necessary.         Continued Psychosocial Services Needed No          Vocational Rehabilitation: Provide vocational rehab assistance to qualifying candidates.   Vocational Rehab Evaluation & Intervention:     Vocational Rehab - 11/06/15 1511      Initial Vocational Rehab Evaluation & Intervention   Assessment shows need for Vocational Rehabilitation No      Education: Education Goals: Education classes will be provided on a weekly basis, covering required topics.  Participant will state understanding/return demonstration of topics presented.  Learning Barriers/Preferences:     Learning Barriers/Preferences - 11/06/15 1514      Learning Barriers/Preferences   Learning Barriers Sight  Memory deficits   Learning Preferences Skilled Demonstration      Education Topics: Count Your Pulse:  -Group instruction provided by verbal instruction, demonstration, patient participation and written materials to support subject.  Instructors address importance of being able to find your pulse and how to count your pulse when at home without a heart monitor.  Patients get hands on experience counting their pulse with staff help and individually.   Heart Attack, Angina, and Risk Factor Modification:  -Group instruction provided by verbal instruction, video, and written materials to support subject.  Instructors address signs and symptoms of angina and heart attacks.    Also discuss risk factors for heart disease and how to make changes to improve heart health risk factors.   Functional Fitness:  -Group instruction provided by verbal instruction, demonstration, patient participation, and written materials to support subject.  Instructors address safety measures for doing things around the house.  Discuss how to get up and down off the floor, how to pick things up properly, how to safely get out of a chair without assistance, and balance training.   Meditation  and Mindfulness:  -Group instruction provided by verbal instruction, patient participation, and written materials to support subject.  Instructor addresses importance of mindfulness and meditation practice to help reduce stress and improve awareness.  Instructor also leads participants through a meditation exercise.    Stretching for Flexibility and Mobility:  -Group instruction provided by verbal instruction, patient participation, and written materials to support subject.  Instructors lead participants  through series of stretches that are designed to increase flexibility thus improving mobility.  These stretches are additional exercise for major muscle groups that are typically performed during regular warm up and cool down.   Hands Only CPR Anytime:  -Group instruction provided by verbal instruction, video, patient participation and written materials to support subject.  Instructors co-teach with AHA video for hands only CPR.  Participants get hands on experience with mannequins.   Nutrition I class: Heart Healthy Eating:  -Group instruction provided by PowerPoint slides, verbal discussion, and written materials to support subject matter. The instructor gives an explanation and review of the Therapeutic Lifestyle Changes diet recommendations, which includes a discussion on lipid goals, dietary fat, sodium, fiber, plant stanol/sterol esters, sugar, and the components of a well-balanced, healthy diet.   Nutrition II class: Lifestyle Skills:  -Group instruction provided by PowerPoint slides, verbal discussion, and written materials to support subject matter. The instructor gives an explanation and review of label reading, grocery shopping for heart health, heart healthy recipe modifications, and ways to make healthier choices when eating out.   Diabetes Question & Answer:  -Group instruction provided by PowerPoint slides, verbal discussion, and written materials to support subject matter. The instructor gives an explanation and review of diabetes co-morbidities, pre- and post-prandial blood glucose goals, pre-exercise blood glucose goals, signs, symptoms, and treatment of hypoglycemia and hyperglycemia, and foot care basics.   Diabetes Blitz:  -Group instruction provided by PowerPoint slides, verbal discussion, and written materials to support subject matter. The instructor gives an explanation and review of the physiology behind type 1 and type 2 diabetes, diabetes medications and rational  behind using different medications, pre- and post-prandial blood glucose recommendations and Hemoglobin A1c goals, diabetes diet, and exercise including blood glucose guidelines for exercising safely.    Portion Distortion:  -Group instruction provided by PowerPoint slides, verbal discussion, written materials, and food models to support subject matter. The instructor gives an explanation of serving size versus portion size, changes in portions sizes over the last 20 years, and what consists of a serving from each food group.   Stress Management:  -Group instruction provided by verbal instruction, video, and written materials to support subject matter.  Instructors review role of stress in heart disease and how to cope with stress positively.     Exercising on Your Own:  -Group instruction provided by verbal instruction, power point, and written materials to support subject.  Instructors discuss benefits of exercise, components of exercise, frequency and intensity of exercise, and end points for exercise.  Also discuss use of nitroglycerin and activating EMS.  Review options of places to exercise outside of rehab.  Review guidelines for sex with heart disease.   Cardiac Drugs I:  -Group instruction provided by verbal instruction and written materials to support subject.  Instructor reviews cardiac drug classes: antiplatelets, anticoagulants, beta blockers, and statins.  Instructor discusses reasons, side effects, and lifestyle considerations for each drug class.   Cardiac Drugs II:  -Group instruction provided by verbal instruction and written materials to support subject.  Instructor reviews cardiac drug  classes: angiotensin converting enzyme inhibitors (ACE-I), angiotensin II receptor blockers (ARBs), nitrates, and calcium channel blockers.  Instructor discusses reasons, side effects, and lifestyle considerations for each drug class.   Anatomy and Physiology of the Circulatory System:   -Group instruction provided by verbal instruction, video, and written materials to support subject.  Reviews functional anatomy of heart, how it relates to various diagnoses, and what role the heart plays in the overall system.   Knowledge Questionnaire Score:     Knowledge Questionnaire Score - 11/06/15 1643      Knowledge Questionnaire Score   Pre Score 15/24      Core Components/Risk Factors/Patient Goals at Admission:     Personal Goals and Risk Factors at Admission - 11/06/15 1511      Core Components/Risk Factors/Patient Goals on Admission    Weight Management Weight Loss;Yes   Expected Outcomes Long Term: Adherence to nutrition and physical activity/exercise program aimed toward attainment of established weight goal;Short Term: Continue to assess and modify interventions until short term weight is achieved;Weight Loss: Understanding of general recommendations for a balanced deficit meal plan, which promotes 1-2 lb weight loss per week and includes a negative energy balance of [phone] kcal/d   Sedentary Yes   Intervention Provide advice, education, support and counseling about physical activity/exercise needs.;Develop an individualized exercise prescription for aerobic and resistive training based on initial evaluation findings, risk stratification, comorbidities and participant's personal goals.   Expected Outcomes Achievement of increased cardiorespiratory fitness and enhanced flexibility, muscular endurance and strength shown through measurements of functional capacity and personal statement of participant.   Increase Strength and Stamina Yes   Intervention Provide advice, education, support and counseling about physical activity/exercise needs.;Develop an individualized exercise prescription for aerobic and resistive training based on initial evaluation findings, risk stratification, comorbidities and participant's personal goals.   Expected Outcomes Achievement of increased  cardiorespiratory fitness and enhanced flexibility, muscular endurance and strength shown through measurements of functional capacity and personal statement of participant.   Diabetes Yes   Intervention Provide education about signs/symptoms and action to take for hypo/hyperglycemia.;Provide education about proper nutrition, including hydration, and aerobic/resistive exercise prescription along with prescribed medications to achieve blood glucose in normal ranges: Fasting glucose 65-99 mg/dL   Expected Outcomes Short Term: Participant verbalizes understanding of the signs/symptoms and immediate care of hyper/hypoglycemia, proper foot care and importance of medication, aerobic/resistive exercise and nutrition plan for blood glucose control.;Long Term: Attainment of HbA1C < 7%.   Personal Goal Other Yes   Personal Goal Build strength in legs. Be able to start lawn care business. Be a motivation to others.   Intervention Provide advicce and education regarding proper aerobic and resistance training exercise to build strength and stamina to do desired work and leisure activites.   Expected Outcomes Achievement of increased leg strength and overall strength and stamina to do work and leisure activities.      Core Components/Risk Factors/Patient Goals Review:    Core Components/Risk Factors/Patient Goals at Discharge (Final Review):    ITP Comments:     ITP Comments    Row Name 11/06/15 1349           ITP Comments Dr Fransico Him Medical Director          Comments: Pt is  Not making expected progress toward personal goals after completing 5 sessions.  Pt has had frequent absences for family/work conflicts and medical complications.   Recommend continued exercise and life style modification education including  stress management  and relaxation techniques to decrease cardiac risk profile.

## 2015-12-10 ENCOUNTER — Encounter (HOSPITAL_COMMUNITY)
Admission: RE | Admit: 2015-12-10 | Discharge: 2015-12-10 | Disposition: A | Discharge status: Home / Self Care | Payer: Medicare Other | Source: Ambulatory Visit | Attending: Interventional Cardiology | Admitting: Interventional Cardiology

## 2015-12-10 ENCOUNTER — Encounter (HOSPITAL_COMMUNITY): Payer: Medicare Other

## 2015-12-10 DIAGNOSIS — I213 ST elevation (STEMI) myocardial infarction of unspecified site: Secondary | ICD-10-CM | POA: Diagnosis present

## 2015-12-10 DIAGNOSIS — Z87891 Personal history of nicotine dependence: Secondary | ICD-10-CM | POA: Diagnosis not present

## 2015-12-10 DIAGNOSIS — Z955 Presence of coronary angioplasty implant and graft: Secondary | ICD-10-CM

## 2015-12-10 DIAGNOSIS — I214 Non-ST elevation (NSTEMI) myocardial infarction: Secondary | ICD-10-CM

## 2015-12-10 DIAGNOSIS — Z79899 Other long term (current) drug therapy: Secondary | ICD-10-CM | POA: Diagnosis not present

## 2015-12-10 LAB — GLUCOSE, CAPILLARY: Glucose-Capillary: 170 mg/dL — ABNORMAL HIGH (ref 65–99)

## 2015-12-10 NOTE — Progress Notes (Signed)
Cardiac Individual Treatment Plan  Patient Details  Name: Thomas Mcclure MRN: [id number] Date of Birth: 1955-03-18 Referring Provider:   Flowsheet Row CARDIAC REHAB PHASE II ORIENTATION from 11/06/2015 in [place]  Referring Provider  [name]      Initial Encounter Date:  [place] PHASE II ORIENTATION from 11/06/2015 in [place]  Date  11/06/15  Referring Provider  [name]      Visit Diagnosis: No diagnosis found.  Patient's Home Medications on Admission:  Current Outpatient Prescriptions:  .  aspirin EC 81 MG tablet, Take 1 tablet (81 mg total) by mouth daily., Disp: 30 tablet, Rfl: 1 .  atorvastatin (LIPITOR) 80 MG tablet, Take 1 tablet (80 mg total) by mouth daily at 6 PM., Disp: 30 tablet, Rfl: 1 .  cyclobenzaprine (FLEXERIL) 10 MG tablet, Take 10 mg by mouth 3 (three) times daily as needed for muscle spasms. , Disp: , Rfl:  .  diazepam (VALIUM) 10 MG tablet, Take 10 mg by mouth 2 (two) times daily. For spasms, Disp: , Rfl:  .  DULoxetine (CYMBALTA) 60 MG capsule, Take 60 mg by mouth daily., Disp: , Rfl:  .  furosemide (LASIX) 40 MG tablet, Take 1 tablet (40 mg total) by mouth daily as needed for fluid or edema (take as needed for swelling or shortness of breath)., Disp: 30 tablet, Rfl: 0 .  lisinopril (PRINIVIL,ZESTRIL) 10 MG tablet, Take 1 tablet (10 mg total) by mouth daily., Disp: 30 tablet, Rfl: 0 .  metFORMIN (GLUCOPHAGE-XR) 750 MG 24 hr tablet, Take 750 mg by mouth daily with supper., Disp: , Rfl:  .  Multiple Vitamin (MULTIVITAMIN WITH MINERALS) TABS tablet, Take 1 tablet by mouth daily., Disp: , Rfl:  .  nitroGLYCERIN (NITROSTAT) 0.4 MG SL tablet, Place 1 tablet (0.4 mg total) under the tongue every 5 (five) minutes as needed for chest pain., Disp: 25 tablet, Rfl: 6 .  omeprazole (PRILOSEC) 40 MG capsule, Take 40 mg by mouth daily., Disp: , Rfl:  .  Oxycodone HCl 10 MG  TABS, Take 10 mg by mouth every 4 (four) hours as needed (pain). , Disp: , Rfl:  .  polyethylene glycol powder (GLYCOLAX/MIRALAX) powder, Take 17 g by mouth daily as needed (constipation). Mix in 8 oz water, juice, soda, coffee or tea and drink, Disp: , Rfl:  .  potassium chloride SA (K-DUR,KLOR-CON) 20 MEQ tablet, Take 1 tablet (20 mEq total) by mouth daily as needed (take only with furosemide (lasix))., Disp: 30 tablet, Rfl: 0 .  pregabalin (LYRICA) 100 MG capsule, Take 100 mg by mouth 3 (three) times daily. , Disp: , Rfl:  .  ticagrelor (BRILINTA) 90 MG TABS tablet, Take 1 tablet (90 mg total) by mouth 2 (two) times daily., Disp: 60 tablet, Rfl: 2 .  topiramate (TOPAMAX) 50 MG tablet, Take 50 mg by mouth 2 (two) times daily as needed (nerve pain). , Disp: , Rfl:  .  traMADol (ULTRAM) 50 MG tablet, Take 100 mg by mouth 2 (two) times daily. , Disp: , Rfl:   Past Medical History: Past Medical History:  Diagnosis Date  . Arthritis    Rheumatoid  . Baker's cyst    Left calf  . CAD in native artery, 07/15/15 PCI of RCA with DES 07/16/2015   a.   NSTEMI 5/17: LHC - pLAD 20, pLCx 20, OM1 30, pRCA 100, EF 45-50%>> PCI: 2.5 x 24 mm Promus  DES to RCA  //  b.   Echo 5/17: mild LVH, EF 50-55%, no RWMA, mod RVE //  c. LHC 6/17: pLAD 20, pLCx 20, OM1 50, pRCA stent ok, EF 35-45% with mod sized inf wall and basal segment aneurysm  . Chronic back pain   . Cyst (solitary) of breast    ? cyst left calf ,knee  . Diabetes mellitus    diet controlled  . GERD (gastroesophageal reflux disease)   . Hyperlipidemia   . Hypertension    Dr. [name] [phone]  . Ischemic cardiomyopathy    a. LV-gram at time of LHC in 6/17 with EF 35-45%  //  b. Echo 7/17: EF 45-50%, inferior HK, grade 1 diastolic dysfunction, mildly dilated aortic root, moderately reduced RVSF, mild RAE  . Myocardial infarction 2017  . 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 Former  Smoker  . Years: 15.00  . Types: Cigarettes  Smokeless Tobacco  . Former Systems developer  . Quit date: 04/08/2015    Comment: 5 cig a week    Labs: Recent Review Flowsheet Data    Labs for ITP Cardiac and Pulmonary Rehab Latest Ref Rng & Units 10/07/2010 12/11/2012 07/15/2015 10/04/2015 12/04/2015   Cholestrol 125 - 200 mg/dL - - 149 124(L) -   LDLCALC <130 mg/dL - - 96 68 -   HDL >=40 mg/dL - - 37(L) 39(L) -   Trlycerides <150 mg/dL - - 82 85 -   Hemoglobin A1c 4.8 - 5.6 % - - 6.6(H) - 6.8(H)   TCO2 0 - 100 mmol/L 25 24 25  - -      Capillary Blood Glucose: Lab Results  Component Value Date   GLUCAP 70 12/05/2015   GLUCAP 87 12/05/2015   GLUCAP 117 (H) 12/04/2015   GLUCAP 131 (H) 12/04/2015   GLUCAP 99 12/04/2015     Exercise Target Goals:    Exercise Program Goal: Individual exercise prescription set with THRR, safety & activity barriers. Participant demonstrates ability to understand and report RPE using BORG scale, to self-measure pulse accurately, and to acknowledge the importance of the exercise prescription.  Exercise Prescription Goal: Starting with aerobic activity 30 plus minutes a day, 3 days per week for initial exercise prescription. Provide home exercise prescription and guidelines that participant acknowledges understanding prior to discharge.  Activity Barriers & Risk Stratification:     Activity Barriers & Cardiac Risk Stratification - 11/06/15 1512      Activity Barriers & Cardiac Risk Stratification   Activity Barriers Back Problems;Joint Problems;History of Falls;Balance Concerns;Assistive Device;Arthritis   Comments peripheral neuropathy, RA   Cardiac Risk Stratification High      6 Minute Walk:     6 Minute Walk    Row Name 11/06/15 1651         6 Minute Walk   Phase Initial     Distance 1286 feet     Walk Time 6 minutes     # of Rest Breaks 0     MPH 2.44     METS 3.12     RPE 11     VO2 Peak 10.91     Symptoms Yes (comment)     Comments  C/o ,mild left hip pain     Resting HR 78 bpm     Resting BP 130/80     Max Ex. HR 85 bpm     Max Ex. BP 142/84  2 Minute Post BP 118/84        Initial Exercise Prescription:     Initial Exercise Prescription - 11/09/15 1600      Date of Initial Exercise RX and Referring Provider   Date 11/06/15   Referring Provider [name]     Bike   Level 1.1   Minutes 10   METs 3.08     NuStep   Level 2   Minutes 10   METs 2.5     Track   Laps 10   Minutes 10   METs 2.74     Prescription Details   Frequency (times per week) 3   Duration Progress to 30 minutes of continuous aerobic without signs/symptoms of physical distress     Intensity   THRR 40-80% of Max Heartrate 64-128   Ratings of Perceived Exertion 11-13   Perceived Dyspnea 0-4     Progression   Progression Continue to progress workloads to maintain intensity without signs/symptoms of physical distress.     Resistance Training   Training Prescription Yes   Weight 2lbs   Reps 10-12      Perform Capillary Blood Glucose checks as needed.  Exercise Prescription Changes:     Exercise Prescription Changes    Row Name 12/03/15 1600             Exercise Review   Progression Yes         Response to Exercise   Blood Pressure (Admit) 108/70       Blood Pressure (Exercise) 122/80       Blood Pressure (Exit) 114/64       Heart Rate (Admit) 87 bpm       Heart Rate (Exercise) 103 bpm       Heart Rate (Exit) 68 bpm       Rating of Perceived Exertion (Exercise) 11       Comments reviewed HEP on 11/19/15       Duration Progress to 30 minutes of continuous aerobic without signs/symptoms of physical distress       Intensity THRR unchanged         Progression   Progression Continue to progress workloads to maintain intensity without signs/symptoms of physical distress.       Average METs 2.7         Resistance Training   Training Prescription Yes       Weight 2lbs       Reps 10-12         Bike    Level 1.1       Minutes 10       METs 3.08         NuStep   Level 4       Minutes 10       METs 2.1         Track   Laps 10       Minutes 10       METs 2.74         Home Exercise Plan   Plans to continue exercise at [place] on 11/19/15. See progress note       Frequency Add 2 additional days to program exercise sessions.          Exercise Comments:   Discharge Exercise Prescription (Final Exercise Prescription Changes):     Exercise Prescription Changes - 12/03/15 1600      Exercise Review   Progression Yes  Response to Exercise   Blood Pressure (Admit) 108/70   Blood Pressure (Exercise) 122/80   Blood Pressure (Exit) 114/64   Heart Rate (Admit) 87 bpm   Heart Rate (Exercise) 103 bpm   Heart Rate (Exit) 68 bpm   Rating of Perceived Exertion (Exercise) 11   Comments reviewed HEP on 11/19/15   Duration Progress to 30 minutes of continuous aerobic without signs/symptoms of physical distress   Intensity THRR unchanged     Progression   Progression Continue to progress workloads to maintain intensity without signs/symptoms of physical distress.   Average METs 2.7     Resistance Training   Training Prescription Yes   Weight 2lbs   Reps 10-12     Bike   Level 1.1   Minutes 10   METs 3.08     NuStep   Level 4   Minutes 10   METs 2.1     Track   Laps 10   Minutes 10   METs 2.74     Home Exercise Plan   Plans to continue exercise at [place] on 11/19/15. See progress note   Frequency Add 2 additional days to program exercise sessions.      Nutrition:  Target Goals: Understanding of nutrition guidelines, daily intake of sodium 1500mg , cholesterol 200mg , calories 30% from fat and 7% or less from saturated fats, daily to have 5 or more servings of fruits and vegetables.  Biometrics:     Pre Biometrics - 11/06/15 1500      Pre Biometrics   Height 5' 10.25" (1.784 m)   Weight 223 lb 15.8 oz (101.6 kg)   Waist  Circumference 44 inches   Hip Circumference 44 inches   Waist to Hip Ratio 1 %   BMI (Calculated) 32   Triceps Skinfold 8.5 mm   % Body Fat 28.2 %   Grip Strength 53 kg   Flexibility --  Not performed due to back issues.   Single Leg Stand 1.09 seconds       Nutrition Therapy Plan and Nutrition Goals:     Nutrition Therapy & Goals - 11/07/15 1409      Nutrition Therapy   Diet Carb Modified, Therapeutic Lifestyle Changes     Personal Nutrition Goals   Personal Goal #1 1-2 lb wt loss/week to a wt loss goal of 6-24 lb at graduation from cardiac rehab     Circle, educate and counsel regarding individualized specific dietary modifications aiming towards targeted core components such as weight, hypertension, lipid management, diabetes, heart failure and other comorbidities.   Expected Outcomes Short Term Goal: Understand basic principles of dietary content, such as calories, fat, sodium, cholesterol and nutrients.;Long Term Goal: Adherence to prescribed nutrition plan.      Nutrition Discharge: Nutrition Scores:     Nutrition Assessments - 11/07/15 1409      MEDFICTS Scores   Pre Score 52      Nutrition Goals Re-Evaluation:   Psychosocial: Target Goals: Acknowledge presence or absence of depression, maximize coping skills, provide positive support system. Participant is able to verbalize types and ability to use techniques and skills needed for reducing stress and depression.  Initial Review & Psychosocial Screening:     Initial Psych Review & Screening - 11/12/15 [place]? Yes     Barriers   Psychosocial barriers to participate in program The patient should benefit from training  in stress management and relaxation.;There are no identifiable barriers or psychosocial needs.     Screening Interventions   Interventions Encouraged to exercise      Quality of Life Scores:     Quality of  Life - 11/06/15 1644      Quality of Life Scores   Health/Function Pre 25.29 %   Socioeconomic Pre 28.29 %   Psych/Spiritual Pre 30 %   Family Pre 26.4 %   GLOBAL Pre 27.09 %      PHQ-9: Recent Review Flowsheet Data    Depression screen [facility] 2/9 11/12/2015   Decreased Interest 0   Down, Depressed, Hopeless 0   PHQ - 2 Score 0      Psychosocial Evaluation and Intervention:     Psychosocial Evaluation - 11/12/15 1630      Psychosocial Evaluation & Interventions   Interventions Stress management education;Relaxation education;Encouraged to exercise with the program and follow exercise prescription   Continued Psychosocial Services Needed No  no psychosocial needs identified, no inteventions necessary       Psychosocial Re-Evaluation:     Psychosocial Re-Evaluation    [place] Name 12/07/15 1123             Psychosocial Re-Evaluation   Interventions Encouraged to attend Cardiac Rehabilitation for the exercise;Stress management education;Relaxation education       Comments pt does exhibit situational stress and anxiety involving interpersonal relationships. pt recent hospitalization resulted in medication adjustments to treat pt presyncopal symptoms.  pt encouraged to participate in stress management and relaxation education in addition to exercise for stress relief. pt has no psychosocial needs identified, no interventions necessary.         Continued Psychosocial Services Needed No          Vocational Rehabilitation: Provide vocational rehab assistance to qualifying candidates.   Vocational Rehab Evaluation & Intervention:     Vocational Rehab - 11/06/15 1511      Initial Vocational Rehab Evaluation & Intervention   Assessment shows need for Vocational Rehabilitation No      Education: Education Goals: Education classes will be provided on a weekly basis, covering required topics. Participant will state understanding/return demonstration of topics  presented.  Learning Barriers/Preferences:     Learning Barriers/Preferences - 11/06/15 1514      Learning Barriers/Preferences   Learning Barriers Sight  Memory deficits   Learning Preferences Skilled Demonstration      Education Topics: Count Your Pulse:  -Group instruction provided by verbal instruction, demonstration, patient participation and written materials to support subject.  Instructors address importance of being able to find your pulse and how to count your pulse when at home without a heart monitor.  Patients get hands on experience counting their pulse with staff help and individually.   Heart Attack, Angina, and Risk Factor Modification:  -Group instruction provided by verbal instruction, video, and written materials to support subject.  Instructors address signs and symptoms of angina and heart attacks.    Also discuss risk factors for heart disease and how to make changes to improve heart health risk factors.   Functional Fitness:  -Group instruction provided by verbal instruction, demonstration, patient participation, and written materials to support subject.  Instructors address safety measures for doing things around the house.  Discuss how to get up and down off the floor, how to pick things up properly, how to safely get out of a chair without assistance, and balance training.   Meditation and Mindfulness:  -Group instruction provided  by verbal instruction, patient participation, and written materials to support subject.  Instructor addresses importance of mindfulness and meditation practice to help reduce stress and improve awareness.  Instructor also leads participants through a meditation exercise.    Stretching for Flexibility and Mobility:  -Group instruction provided by verbal instruction, patient participation, and written materials to support subject.  Instructors lead participants through series of stretches that are designed to increase flexibility  thus improving mobility.  These stretches are additional exercise for major muscle groups that are typically performed during regular warm up and cool down.   Hands Only CPR Anytime:  -Group instruction provided by verbal instruction, video, patient participation and written materials to support subject.  Instructors co-teach with AHA video for hands only CPR.  Participants get hands on experience with mannequins.   Nutrition I class: Heart Healthy Eating:  -Group instruction provided by PowerPoint slides, verbal discussion, and written materials to support subject matter. The instructor gives an explanation and review of the Therapeutic Lifestyle Changes diet recommendations, which includes a discussion on lipid goals, dietary fat, sodium, fiber, plant stanol/sterol esters, sugar, and the components of a well-balanced, healthy diet.   Nutrition II class: Lifestyle Skills:  -Group instruction provided by PowerPoint slides, verbal discussion, and written materials to support subject matter. The instructor gives an explanation and review of label reading, grocery shopping for heart health, heart healthy recipe modifications, and ways to make healthier choices when eating out.   Diabetes Question & Answer:  -Group instruction provided by PowerPoint slides, verbal discussion, and written materials to support subject matter. The instructor gives an explanation and review of diabetes co-morbidities, pre- and post-prandial blood glucose goals, pre-exercise blood glucose goals, signs, symptoms, and treatment of hypoglycemia and hyperglycemia, and foot care basics.   Diabetes Blitz:  -Group instruction provided by PowerPoint slides, verbal discussion, and written materials to support subject matter. The instructor gives an explanation and review of the physiology behind type 1 and type 2 diabetes, diabetes medications and rational behind using different medications, pre- and post-prandial blood glucose  recommendations and Hemoglobin A1c goals, diabetes diet, and exercise including blood glucose guidelines for exercising safely.    Portion Distortion:  -Group instruction provided by PowerPoint slides, verbal discussion, written materials, and food models to support subject matter. The instructor gives an explanation of serving size versus portion size, changes in portions sizes over the last 20 years, and what consists of a serving from each food group.   Stress Management:  -Group instruction provided by verbal instruction, video, and written materials to support subject matter.  Instructors review role of stress in heart disease and how to cope with stress positively.     Exercising on Your Own:  -Group instruction provided by verbal instruction, power point, and written materials to support subject.  Instructors discuss benefits of exercise, components of exercise, frequency and intensity of exercise, and end points for exercise.  Also discuss use of nitroglycerin and activating EMS.  Review options of places to exercise outside of rehab.  Review guidelines for sex with heart disease.   Cardiac Drugs I:  -Group instruction provided by verbal instruction and written materials to support subject.  Instructor reviews cardiac drug classes: antiplatelets, anticoagulants, beta blockers, and statins.  Instructor discusses reasons, side effects, and lifestyle considerations for each drug class.   Cardiac Drugs II:  -Group instruction provided by verbal instruction and written materials to support subject.  Instructor reviews cardiac drug classes: angiotensin converting enzyme inhibitors (ACE-I),  angiotensin II receptor blockers (ARBs), nitrates, and calcium channel blockers.  Instructor discusses reasons, side effects, and lifestyle considerations for each drug class.   Anatomy and Physiology of the Circulatory System:  -Group instruction provided by verbal instruction, video, and written  materials to support subject.  Reviews functional anatomy of heart, how it relates to various diagnoses, and what role the heart plays in the overall system.   Knowledge Questionnaire Score:     Knowledge Questionnaire Score - 11/06/15 1643      Knowledge Questionnaire Score   Pre Score 15/24      Core Components/Risk Factors/Patient Goals at Admission:     Personal Goals and Risk Factors at Admission - 11/06/15 1511      Core Components/Risk Factors/Patient Goals on Admission    Weight Management Weight Loss;Yes   Expected Outcomes Long Term: Adherence to nutrition and physical activity/exercise program aimed toward attainment of established weight goal;Short Term: Continue to assess and modify interventions until short term weight is achieved;Weight Loss: Understanding of general recommendations for a balanced deficit meal plan, which promotes 1-2 lb weight loss per week and includes a negative energy balance of [phone] kcal/d   Sedentary Yes   Intervention Provide advice, education, support and counseling about physical activity/exercise needs.;Develop an individualized exercise prescription for aerobic and resistive training based on initial evaluation findings, risk stratification, comorbidities and participant's personal goals.   Expected Outcomes Achievement of increased cardiorespiratory fitness and enhanced flexibility, muscular endurance and strength shown through measurements of functional capacity and personal statement of participant.   Increase Strength and Stamina Yes   Intervention Provide advice, education, support and counseling about physical activity/exercise needs.;Develop an individualized exercise prescription for aerobic and resistive training based on initial evaluation findings, risk stratification, comorbidities and participant's personal goals.   Expected Outcomes Achievement of increased cardiorespiratory fitness and enhanced flexibility, muscular endurance and  strength shown through measurements of functional capacity and personal statement of participant.   Diabetes Yes   Intervention Provide education about signs/symptoms and action to take for hypo/hyperglycemia.;Provide education about proper nutrition, including hydration, and aerobic/resistive exercise prescription along with prescribed medications to achieve blood glucose in normal ranges: Fasting glucose 65-99 mg/dL   Expected Outcomes Short Term: Participant verbalizes understanding of the signs/symptoms and immediate care of hyper/hypoglycemia, proper foot care and importance of medication, aerobic/resistive exercise and nutrition plan for blood glucose control.;Long Term: Attainment of HbA1C < 7%.   Personal Goal Other Yes   Personal Goal Build strength in legs. Be able to start lawn care business. Be a motivation to others.   Intervention Provide advicce and education regarding proper aerobic and resistance training exercise to build strength and stamina to do desired work and leisure activites.   Expected Outcomes Achievement of increased leg strength and overall strength and stamina to do work and leisure activities.      Core Components/Risk Factors/Patient Goals Review:    Core Components/Risk Factors/Patient Goals at Discharge (Final Review):    ITP Comments:     ITP Comments    Row Name 11/06/15 1349           ITP Comments Dr Fransico Him Medical Director          Comments: Pt is not making expected progress toward personal goals after completing 5 sessions.  Pt has had frequent absences, due to health related problems. Pt has had recent admission with medication adjustments per cardiology.   Recommend continued exercise and life style modification education including  stress management and relaxation techniques to decrease cardiac risk profile.

## 2015-12-12 ENCOUNTER — Ambulatory Visit (HOSPITAL_COMMUNITY)
Admission: RE | Admit: 2015-12-12 | Discharge: 2015-12-12 | Disposition: A | Discharge status: Home / Self Care | Payer: Medicare Other | Source: Ambulatory Visit | Attending: Family Medicine | Admitting: Family Medicine

## 2015-12-12 ENCOUNTER — Encounter (HOSPITAL_COMMUNITY)
Admission: RE | Admit: 2015-12-12 | Discharge: 2015-12-12 | Disposition: A | Discharge status: Home / Self Care | Payer: Medicare Other | Source: Ambulatory Visit | Attending: Interventional Cardiology | Admitting: Interventional Cardiology

## 2015-12-12 ENCOUNTER — Telehealth: Payer: Self-pay | Admitting: Interventional Cardiology

## 2015-12-12 ENCOUNTER — Encounter (HOSPITAL_COMMUNITY): Payer: Medicare Other

## 2015-12-12 DIAGNOSIS — Z955 Presence of coronary angioplasty implant and graft: Secondary | ICD-10-CM

## 2015-12-12 DIAGNOSIS — I214 Non-ST elevation (NSTEMI) myocardial infarction: Secondary | ICD-10-CM

## 2015-12-12 DIAGNOSIS — Z87891 Personal history of nicotine dependence: Secondary | ICD-10-CM | POA: Diagnosis not present

## 2015-12-12 DIAGNOSIS — Z79899 Other long term (current) drug therapy: Secondary | ICD-10-CM | POA: Diagnosis not present

## 2015-12-12 DIAGNOSIS — I213 ST elevation (STEMI) myocardial infarction of unspecified site: Secondary | ICD-10-CM | POA: Diagnosis not present

## 2015-12-12 LAB — GLUCOSE, CAPILLARY
Glucose-Capillary: 108 mg/dL — ABNORMAL HIGH (ref 65–99)
Glucose-Capillary: 126 mg/dL — ABNORMAL HIGH (ref 65–99)

## 2015-12-12 NOTE — Progress Notes (Signed)
Pt hypertensive at cardiac rehab.  BP;  138/96 pre-exercise.  Peak 140/104 with exercise. Recheck:  120/80 with large adult cuff.   Pt c/o mild chest pain at rest during cool down.  Rates 1/10, dull ache, atypical for him. Self relieved.  Pt reports he sometimes gets a similar symptom when he has not eaten.  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  Pt ate 2 apples prior to coming to cardiac rehab.  Telemetry-sinus rhythm.  Dr. Thompson Caul office notified.  12 lead EKG obtained.  Follow up appt scheduled with Cecilie Kicks, NP 12/19/2015 @ 3pm.  Written and verbal instruction given. Pt verbalized understanding.   Pt denies pain prior to leaving cardiac rehab.

## 2015-12-12 NOTE — Telephone Encounter (Signed)
Joann with Cardiac Rehab calling about patient having chest discomfort after his exercise at Cardiac Rehab. Arville Go states patient is asymptomatic, and he was recently in the hospital for near syncope and weakness. Joann noticed patient did not have a follow-up appointment after hospital visit. Made an appointment with APP since [name] had no openings. Arville Go is going to get an EKG, but states she did not see any changes in his rhythm on their monitor. Will forward to [name] nurse, so she is aware.

## 2015-12-12 NOTE — Telephone Encounter (Signed)
Thomas Mcclure from Cardiac Rehab is calling

## 2015-12-13 ENCOUNTER — Other Ambulatory Visit: Payer: Self-pay | Admitting: Interventional Cardiology

## 2015-12-14 ENCOUNTER — Encounter (HOSPITAL_COMMUNITY): Payer: Medicare Other

## 2015-12-17 ENCOUNTER — Encounter (HOSPITAL_COMMUNITY): Payer: Medicare Other

## 2015-12-17 ENCOUNTER — Encounter (HOSPITAL_COMMUNITY)
Admission: RE | Admit: 2015-12-17 | Discharge: 2015-12-17 | Disposition: A | Discharge status: Home / Self Care | Payer: Medicare Other | Source: Ambulatory Visit | Attending: Interventional Cardiology | Admitting: Interventional Cardiology

## 2015-12-17 ENCOUNTER — Encounter: Payer: Self-pay | Admitting: Cardiology

## 2015-12-17 DIAGNOSIS — I214 Non-ST elevation (NSTEMI) myocardial infarction: Secondary | ICD-10-CM

## 2015-12-17 DIAGNOSIS — Z955 Presence of coronary angioplasty implant and graft: Secondary | ICD-10-CM | POA: Diagnosis not present

## 2015-12-17 LAB — GLUCOSE, CAPILLARY
Glucose-Capillary: 84 mg/dL (ref 65–99)
Glucose-Capillary: 140 mg/dL — ABNORMAL HIGH (ref 65–99)

## 2015-12-17 NOTE — Progress Notes (Signed)
Incomplete Session Note  Patient Details  Name: Thomas Mcclure MRN: [id number] Date of Birth: [birth year] Referring Provider:   Flowsheet Row CARDIAC REHAB PHASE II ORIENTATION from 11/06/2015 in [place]  Referring Provider  [name]      Thomas Mcclure did not complete his rehab session.  Thomas Mcclure's CBG was 84 this afternoon. No exercise per protocol. Thomas Mcclure ate breakfast at 10:30 and had a banana around 12:45. Patient was given peanut butter graham crackers and a Ginger ale.Repeat CBG 141. I asked the Thomas Mcclure eat his meal closer to the time he comes to exercise. Patient states understanding. Exit blood pressure 118/82.  Heart rate 70. Will continue to monitor the patient throughout  the program.Maria Venetia Maxon, RN,BSN 12/17/2015 6:09 PM

## 2015-12-19 ENCOUNTER — Other Ambulatory Visit: Payer: Self-pay | Admitting: Internal Medicine

## 2015-12-19 ENCOUNTER — Ambulatory Visit (INDEPENDENT_AMBULATORY_CARE_PROVIDER_SITE_OTHER): Payer: Medicare Other | Admitting: Cardiology

## 2015-12-19 ENCOUNTER — Encounter: Payer: Self-pay | Admitting: Cardiology

## 2015-12-19 ENCOUNTER — Encounter (HOSPITAL_COMMUNITY): Payer: Medicare Other

## 2015-12-19 ENCOUNTER — Encounter (HOSPITAL_COMMUNITY): Admission: RE | Admit: 2015-12-19 | Payer: Medicare Other | Source: Ambulatory Visit

## 2015-12-19 VITALS — BP 130/94 | HR 72 | Ht 70.0 in | Wt 219.8 lb

## 2015-12-19 DIAGNOSIS — E782 Mixed hyperlipidemia: Secondary | ICD-10-CM

## 2015-12-19 DIAGNOSIS — E7849 Other hyperlipidemia: Secondary | ICD-10-CM

## 2015-12-19 DIAGNOSIS — I255 Ischemic cardiomyopathy: Secondary | ICD-10-CM

## 2015-12-19 DIAGNOSIS — I208 Other forms of angina pectoris: Secondary | ICD-10-CM | POA: Diagnosis not present

## 2015-12-19 DIAGNOSIS — I209 Angina pectoris, unspecified: Secondary | ICD-10-CM

## 2015-12-19 DIAGNOSIS — I2510 Atherosclerotic heart disease of native coronary artery without angina pectoris: Secondary | ICD-10-CM

## 2015-12-19 DIAGNOSIS — E876 Hypokalemia: Secondary | ICD-10-CM

## 2015-12-19 DIAGNOSIS — I10 Essential (primary) hypertension: Secondary | ICD-10-CM

## 2015-12-19 DIAGNOSIS — E784 Other hyperlipidemia: Secondary | ICD-10-CM | POA: Diagnosis not present

## 2015-12-19 DIAGNOSIS — I5042 Chronic combined systolic (congestive) and diastolic (congestive) heart failure: Secondary | ICD-10-CM

## 2015-12-19 LAB — BASIC METABOLIC PANEL
BUN: 11 mg/dL (ref 7–25)
CO2: 29 mmol/L (ref 20–31)
Calcium: 9.7 mg/dL (ref 8.6–10.3)
Chloride: 102 mmol/L (ref 98–110)
Creat: 1.08 mg/dL (ref 0.70–1.25)
Glucose, Bld: 79 mg/dL (ref 65–99)
Potassium: 3.7 mmol/L (ref 3.5–5.3)
Sodium: 141 mmol/L (ref 135–146)

## 2015-12-19 MED ORDER — METOPROLOL TARTRATE 25 MG PO TABS: 12.5000 mg | ORAL_TABLET | Freq: Two times a day (BID) | ORAL | 3 refills | Status: DC

## 2015-12-19 NOTE — Patient Instructions (Addendum)
Medication Instructions:  Your physician has recommended you make the following change in your medication:  1-Decrease Metoprolol 12.5 mg by mouth twice daily  Labwork: Your physician recommends that you have lab work today. BMETl   Testing/Procedures: NONE  Follow-Up: Your physician wants you to follow-up in: 2 weeks with Cecilie Kicks NP.   If you need a refill on your cardiac medications before your next appointment, please call your pharmacy.

## 2015-12-19 NOTE — Progress Notes (Signed)
Cardiology Office Note   Date:  12/19/2015   ID:  Drayden, Cleckler [birth year], MRN [id number]  PCP:  [name], MD  Cardiologist:  [name]    Chief Complaint  Patient presents with  . Hospitalization Follow-up    recent for weakness with hypotension      History of Present Illness: Thomas Mcclure is a 60 y.o. male who presents for chest pain and SOB.    He has a hx of CAD, diabetes, HTN, HL, chronic back pain.  Admitted in 5/17 with a non-STEMI. LHC demonstrated an occluded proximal RCA which was treated with aspiration thrombectomy and DES. Echocardiogram demonstrated mild LVH with normal LV function and normal wall motion, moderate RVE and hypocontractility of the RV apex.  .   Admitted in 6/17 with symptoms of unstable angina and minimally elevated troponin levels without clear trend. LHC demonstrated patent RCA stent and no significant CAD elsewhere. There was an OM1 50% stenosis. 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  Readmitted 6/30-7/2 with generalized fatigue. Follow-up echocardiogram demonstrated EF 45-50% with inferior hypokinesis and mild diastolic dysfunction. RVSF was moderately reduced. Patient did have mildly elevated troponin levels without clear trend.   He did begin cardiac rehab, and on 12/03/15 complained of chest pain and SOB and near syncope and sent to ER. He complained of increasing SOB walking in driveway.  The day of the 9th he had near syncope walking around the tract.  Also with Lt sided chest pain.  EKG had no acute changes.  He did have PVCs in ER.  By the next day his HR dropped to 38 and BP 63/41.  NTG was stopped. NS bolus given.  Troponin flat at 0.06 to 0.05. His last evaluation his left ventricle ejection fraction improved from 35-40% to 45-50%.  His lisinopril decreased from 20 to 10 mg. His dilt and metoprolol were stopped as well. Cardiac rehab called due to recurrent chest pain  that resolves with rest.    Today wife is upset with hospital RN, she believes the RN gave both night and day meds at the same time.  It was after this that his BP and HR dropped.  meds stopped as described.  Pt does have chest pain, some is chronic but it is not bothering him too much.  If he does have pain NTG usually resolves. His wife feels that he did better on the metoprolol.  His BP is higher today but looking at cardiac rehab his BP was [id number] systolic. We discussed diet as he does have hypoglycemia at rehab.  Cannot afford Ensure type drinks, will try apple and peanut butter before rehab. Marland Kitchen His PRN lasix he takes daily.      Past Medical History:  Diagnosis Date  . Arthritis    Rheumatoid  . Baker's cyst    Left calf  . CAD in native artery, 07/15/15 PCI of RCA with DES 07/16/2015   a.   NSTEMI 5/17: LHC - pLAD 20, pLCx 20, OM1 30, pRCA 100, EF 45-50%>> PCI: 2.5 x 24 mm Promus DES to RCA  //  b.   Echo 5/17: mild LVH, EF 50-55%, no RWMA, mod RVE //  c. LHC 6/17: pLAD 20, pLCx 20, OM1 50, pRCA stent ok, EF 35-45% with mod sized inf wall and basal segment aneurysm  . Chronic back pain   . Cyst (solitary) of breast    ? cyst  left calf ,knee  . Diabetes mellitus    diet controlled  . GERD (gastroesophageal reflux disease)   . Hyperlipidemia   . Hypertension    Dr. [name] [phone]  . Ischemic cardiomyopathy    a. LV-gram at time of LHC in 6/17 with EF 35-45%  //  b. Echo 7/17: EF 45-50%, inferior HK, grade 1 diastolic dysfunction, mildly dilated aortic root, moderately reduced RVSF, mild RAE  . Myocardial infarction 2017  . 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 CARDIAC CATHETERIZATION N/A 07/15/2015   Procedure: Left Heart Cath and Coronary Angiography;  Surgeon: [name], MD;  Location: [facility];  Service: Cardiovascular;  Laterality: N/A;  . CARDIAC CATHETERIZATION N/A 07/15/2015    Procedure: Coronary Stent Intervention;  Surgeon: [name], MD;  Location: [facility];  Service: Cardiovascular;  Laterality: N/A;  . CARDIAC CATHETERIZATION N/A 08/07/2015   Procedure: Left Heart Cath and Coronary Angiography;  Surgeon: [name], MD;  Location: [facility];  Service: Cardiovascular;  Laterality: N/A;  . CORONARY STENT PLACEMENT  07/2015  . KNEE SURGERY    . LUMBAR SPINE SURGERY  03/2009  & 2012  . MASS EXCISION N/A 04/19/2012   Procedure: removal of posterior cervical lipoma;  Surgeon: [name], MD;  Location: [facility];  Service: Neurosurgery;  Laterality: N/A;  Removal of posterior cervical lipoma  . SPINAL CORD STIMULATOR INSERTION N/A 01/07/2013   Procedure:  SPINAL CORD STIMULATOR INSERTION;  Surgeon: [name], MD;  Location: [facility];  Service: Neurosurgery;  Laterality: N/A;  . WISDOM TOOTH EXTRACTION     hx of     Current Outpatient Prescriptions  Medication Sig Dispense Refill  . aspirin EC 81 MG tablet Take 1 tablet (81 mg total) by mouth daily. 30 tablet 1  . atorvastatin (LIPITOR) 80 MG tablet Take 1 tablet (80 mg total) by mouth daily at 6 PM. 30 tablet 1  . BRILINTA 90 MG TABS tablet TAKE 1 TABLET(90 MG) BY MOUTH TWICE DAILY 60 tablet 9  . cyclobenzaprine (FLEXERIL) 10 MG tablet Take 10 mg by mouth 3 (three) times daily as needed for muscle spasms.     . diazepam (VALIUM) 10 MG tablet Take 10 mg by mouth 2 (two) times daily. For spasms    . DULoxetine (CYMBALTA) 60 MG capsule Take 60 mg by mouth daily.    . furosemide (LASIX) 40 MG tablet Take 1 tablet (40 mg total) by mouth daily as needed for fluid or edema (take as needed for swelling or shortness of breath). 30 tablet 0  . lisinopril (PRINIVIL,ZESTRIL) 10 MG tablet Take 1 tablet (10 mg total) by mouth daily. 30 tablet 0  . metFORMIN (GLUCOPHAGE-XR) 750 MG 24 hr tablet Take 750 mg by mouth daily with supper.    . nitroGLYCERIN (NITROSTAT) 0.4 MG SL tablet Place 1  tablet (0.4 mg total) under the tongue every 5 (five) minutes as needed for chest pain. 25 tablet 6  . omeprazole (PRILOSEC) 40 MG capsule Take 40 mg by mouth daily.    . Oxycodone HCl 10 MG TABS Take 10 mg by mouth every 4 (four) hours as needed (pain).     . polyethylene glycol powder (GLYCOLAX/MIRALAX) powder Take 17 g by mouth daily as needed (constipation). Mix in 8 oz water, juice, soda, coffee or tea and drink    .  potassium chloride (K-DUR) 10 MEQ tablet TAKE 10 MG TABLETS BY MOUTH (20 MG TOTAL) TWICE DAILY    . pregabalin (LYRICA) 100 MG capsule Take 100 mg by mouth 3 (three) times daily.     Marland Kitchen topiramate (TOPAMAX) 50 MG tablet Take 50 mg by mouth 2 (two) times daily as needed (nerve pain).     . traMADol (ULTRAM) 50 MG tablet Take 100 mg by mouth 2 (two) times daily.     . metoprolol tartrate (LOPRESSOR) 25 MG tablet Take 0.5 tablets (12.5 mg total) by mouth 2 (two) times daily. 90 tablet 3   No current facility-administered medications for this visit.     Allergies:   Toradol [ketorolac tromethamine]; Methylprednisolone; Prednisone; Adhesive [tape]; and Other    Social History:  The patient  reports that he has quit smoking. His smoking use included Cigarettes. He quit after 15.00 years of use. He quit smokeless tobacco use about 8 months ago. He reports that he drinks alcohol. He reports that he does not use drugs.   Family History:  The patient's family history includes Heart disease in his mother; Prostate cancer in his brother.    ROS:  General:no colds or fevers, + weight gain but overall stable. Skin:no rashes or ulcers HEENT:no blurred vision, no congestion CV:see HPI PUL:see HPI GI:no diarrhea constipation or melena, no indigestion GU:no hematuria, no dysuria MS:no joint pain, no claudication, + chronic back pain Neuro:no syncope, no lightheadedness Endo:+ diabetes- hypoglycemic at times, no thyroid disease  Wt Readings from Last 3 Encounters:  12/19/15 219 lb 12.8  oz (99.7 kg)  12/05/15 208 lb 6.4 oz (94.5 kg)  11/06/15 223 lb 15.8 oz (101.6 kg)     PHYSICAL EXAM: VS:  BP (!) 130/94   Pulse 72   Ht 5\' 10"  (1.778 m)   Wt 219 lb 12.8 oz (99.7 kg)   BMI 31.54 kg/m  , BMI Body mass index is 31.54 kg/m. General:Pleasant affect, NAD Skin:Warm and dry, brisk capillary refill HEENT:normocephalic, sclera clear, mucus membranes moist Neck:supple, + mild JVD, no bruits  Heart:S1S2 RRR without murmur, gallup, rub or click Lungs:clear without rales, rhonchi, or wheezes [id number], non tender, + BS, do not palpate liver spleen or masses Ext:no lower ext edema, 2+ pedal pulses, 2+ radial pulses Neuro:alert and oriented, MAE, follows commands, + facial symmetry    EKG:  EKG is NOT ordered today. The ekg reviewed from hospital.    Recent Labs: 08/25/2015: TSH 0.388 12/03/2015: ALT 18; B Natriuretic Peptide 54.7 12/05/2015: BUN 9; Creatinine, Ser 1.28; Hemoglobin 12.4; Magnesium 1.7; Platelets 233; Potassium 3.1; Sodium 139    Lipid Panel    Component Value Date/Time   CHOL 124 (L) 10/04/2015 1018   TRIG 85 10/04/2015 1018   HDL 39 (L) 10/04/2015 1018   CHOLHDL 3.2 10/04/2015 1018   VLDL 17 10/04/2015 1018   LDLCALC 68 10/04/2015 1018       Other studies Reviewed: Additional studies/ records that were reviewed today include: . Echo 08/25/15 EF 45-50%, inferior HK, red 1 diastolic dysfunction, mildly dilated aortic root, moderately reduced RVSF, mild RAE  LHC 08/07/15 LAD proximal 20% LCx proximal 20% OM1 50% RCA proximal stent patent EF 35-45% LVEDP 23 mmHg No obvious explanation for the patient's ongoing chest pain complaints.  Echo 07/15/15 Mild LVH, EF 50-55%, normal wall motion, moderate RVE  LHC 07/15/15 LAD proximal 20% LCx proximal 20%, OM1 30% RCA proximal 100% EF 45-50% with inferior wall motion abnormalities PCI: STENT PROMUS  PREM MR 3.5X24 To the RCA    ASSESSMENT AND PLAN:  1. CAD - He is status post non-STEMI  in 5/17 treated with DES to the RCA. Repeat LHC in 6/17 demonstrated patent RCA stent, mild plaque in the LAD and LCx in moderate nonobstructive disease in the OM1. EF was 35-45% with inferior wall aneurysm. Follow-up echo with inferior hypokinesis and EF 45-50%. He has continued to have recurrent episodes of chest discomfort. Recent admit for near syncope and then in hospital he was hypotensive and bradycardic, dilt and metoprolol stopped.  Lisinopril decreased to 10 mg.   He continues to have chronically elevated troponin levels. He continues with occ chest pain.             - Continue aspirin, Brilinta, statin,  ACE inhibitor.  Will resume low dose of BB lopressor 12.5 BID.             -  He attends cardiac rehab .             - wit hypotension if pain continues may need Ranexa if pain continues.   2. Combined systolic and diastolic CHF - NYHA [id number].  Continue ACEI, beta blocker- resume at low dose.  BMET today. Pt continues with lasix daily.  He was hypokalemic in the hospital   3. Ischemic CM - EF 45-50% by most recent echocardiogram. Continue beta blocker, ACE inhibitor. ( both at lower doses)  4. HTN -  Controlled- hypotensive in hospital. Will see back in 2 weeks.  5. HL - Continue statin.    6. Hypoglycemic, here in office, gave coke and crackers.  Current medicines are reviewed with the patient today.  The patient Has no concerns regarding medicines.  The following changes have been made:  See above Labs/ tests ordered today include:see above  Disposition:   FU:  see above  Signed, Cecilie Kicks, NP  12/19/2015 4:44 PM    [facility] [address], [place], [place] Phone: [phone]; Fax: [phone]

## 2015-12-20 ENCOUNTER — Encounter: Payer: Self-pay | Admitting: Cardiology

## 2015-12-20 ENCOUNTER — Telehealth: Payer: Self-pay | Admitting: Cardiology

## 2015-12-20 NOTE — Telephone Encounter (Signed)
Follow up ° ° ° ° ° °Returning a call to the nurse to get lab results °

## 2015-12-20 NOTE — Telephone Encounter (Signed)
Pt aware of his lab results and he verbalized understanding.

## 2015-12-20 NOTE — Telephone Encounter (Signed)
Follow Up:; ° ° °Returning your call. °

## 2015-12-20 NOTE — Telephone Encounter (Signed)
Returned pts call, lmptcb

## 2015-12-21 ENCOUNTER — Encounter (HOSPITAL_COMMUNITY): Payer: Medicare Other

## 2015-12-24 ENCOUNTER — Telehealth: Payer: Self-pay | Admitting: Interventional Cardiology

## 2015-12-24 ENCOUNTER — Encounter (HOSPITAL_COMMUNITY)
Admission: RE | Admit: 2015-12-24 | Discharge: 2015-12-24 | Disposition: A | Discharge status: Home / Self Care | Payer: Medicare Other | Source: Ambulatory Visit | Attending: Interventional Cardiology | Admitting: Interventional Cardiology

## 2015-12-24 ENCOUNTER — Encounter (HOSPITAL_COMMUNITY): Payer: Medicare Other

## 2015-12-24 DIAGNOSIS — Z955 Presence of coronary angioplasty implant and graft: Secondary | ICD-10-CM | POA: Diagnosis not present

## 2015-12-24 DIAGNOSIS — I214 Non-ST elevation (NSTEMI) myocardial infarction: Secondary | ICD-10-CM

## 2015-12-24 LAB — GLUCOSE, CAPILLARY
Glucose-Capillary: 124 mg/dL — ABNORMAL HIGH (ref 65–99)
Glucose-Capillary: 126 mg/dL — ABNORMAL HIGH (ref 65–99)

## 2015-12-24 NOTE — Progress Notes (Signed)
Daily Session Note  Patient Details  Name: Thomas Mcclure MRN: [id number] Date of Birth: [birth year] Referring Provider:   Flowsheet Row CARDIAC REHAB PHASE II ORIENTATION from 11/06/2015 in [place]  Referring Provider  [name]      Encounter Date: 12/24/2015  Check In:     Session Check In - 12/24/15 1357      Check-In   Location [facility]   Staff Present Cleda Mccreedy, MS, Exercise Physiologist;Olinty Celesta Aver, MS, ACSM CEP, Exercise Physiologist;Maria Whitaker, RN, BSN;Amber Fair, MS, ACSM RCEP, Exercise Physiologist;Arena Lindahl, RN, BSN   Supervising physician immediately available to respond to emergencies [facility] immediately available   Physician(s) Dr. Alfredia Ferguson   Medication changes reported     No   Fall or balance concerns reported    No   Warm-up and Cool-down Performed as group-led instruction   Resistance Training Performed Yes   VAD Patient? No     Pain Assessment   Currently in Pain? No/denies   Multiple Pain Sites No      Capillary Blood Glucose: Results for orders placed or performed during the hospital encounter of 12/24/15 (from the past 24 hour(s))  Glucose, capillary     Status: Abnormal   Collection Time: 12/24/15  1:35 PM  Result Value Ref Range   Glucose-Capillary 124 (H) 65 - 99 mg/dL  Glucose, capillary     Status: Abnormal   Collection Time: 12/24/15  2:14 PM  Result Value Ref Range   Glucose-Capillary 126 (H) 65 - 99 mg/dL     Goals Met:  pt achieved 2 15 minute stations, equal to 30 minutes of aerobic exercise.  Goals Unmet:  dizziness, weakness and fatigue post exercise  Comments: pt c/o dizziness, weakness and fatigue post exercise.  Pt reports leg weakness has been persistent since his recent hospitalization. Pt reports he was so fatigued yesterday he slept from 4-9pm for a nap.  Pt then did not rest well last night. Orthostatic VS- 117/79, 67lying, 110/73, 81  sitting, 100/73, 85 standing.  Pt asymptomatic with these position changes. Pt was advised by cardiology office to increase Kdur dosage to 82mq daily.  Pt has had 1104m today, pt instructed to take additional 103mupon arrival home. Pt also changed exercise routine working 15 minutes straight on 2 stations. Pt instructed to include rest break in that routine.  Will continue to monitor. Pt has scheduled office f/u appt next week. Pt instructed to contact office if symptoms increase or worsen at home.  LauCecilie KicksP made aware of pt symptoms today.  Pt verbalized understanding.    Dr. TraFransico Him Medical Director [facility]

## 2015-12-24 NOTE — Telephone Encounter (Signed)
New Message  Pts wife voiced she is wanting nurse to call.  Pts wife voiced when giving pt potassium pill it was 10 mg but pt needs 20 mg.  Please f/u with pt

## 2015-12-24 NOTE — Telephone Encounter (Signed)
Spoke with pt and wife and informed them that per pt's recent lab work, Thomas Kicks, NP said for pt to take K+ 29mEq daily. Wife states they did not realize the hospital sent pt home with 83mEq tablets.  Advised pt to take 2 tablets of the 22mEq daily.  Pt and wife verbalized understanding.

## 2015-12-25 ENCOUNTER — Other Ambulatory Visit: Payer: Self-pay | Admitting: Physician Assistant

## 2015-12-26 ENCOUNTER — Encounter (HOSPITAL_COMMUNITY): Payer: Medicare Other

## 2015-12-26 ENCOUNTER — Other Ambulatory Visit: Payer: Self-pay | Admitting: *Deleted

## 2015-12-26 ENCOUNTER — Telehealth: Payer: Self-pay | Admitting: *Deleted

## 2015-12-26 DIAGNOSIS — I10 Essential (primary) hypertension: Secondary | ICD-10-CM

## 2015-12-26 MED ORDER — POTASSIUM CHLORIDE ER 10 MEQ PO TBCR: 10.0000 meq | EXTENDED_RELEASE_TABLET | Freq: Two times a day (BID) | ORAL | 0 refills | Status: DC

## 2015-12-26 NOTE — Telephone Encounter (Signed)
Not from heparin and NTG, I think his BP still runs low at times.  Lets follow with rehab.

## 2015-12-26 NOTE — Telephone Encounter (Signed)
lmptcb re: pt needs BMP today and need to make sure he is taking Lisinopril 5 mg qd and Potassium 20 meq daily

## 2015-12-26 NOTE — Telephone Encounter (Signed)
Pt called back.  He did confirm that he is taking Lisinopril 5 mg qd and Potassium 20 meq daily and he will come by the office to have lab work done today.  Pt also states that every since he came home from the hospital he has been have generalized weakness, but not starting around 3- 4 in the afternoon.. Pt wanted to know if it could come from being on Heparin or Nitro in the hospital?  Pt was advised to call his pcp re: , but I would ask you.  Please advise!

## 2015-12-27 NOTE — Telephone Encounter (Signed)
Called pt back re: concern for weakness. lmptcb 12/27/15

## 2015-12-28 ENCOUNTER — Encounter (HOSPITAL_COMMUNITY): Payer: Medicare Other

## 2015-12-30 ENCOUNTER — Encounter (HOSPITAL_COMMUNITY): Payer: Self-pay | Admitting: Emergency Medicine

## 2015-12-30 ENCOUNTER — Observation Stay (HOSPITAL_COMMUNITY)
Admission: EM | Admit: 2015-12-30 | Discharge: 2016-01-01 | Disposition: A | Discharge status: Home / Self Care | Payer: Medicare Other | Attending: Internal Medicine | Admitting: Internal Medicine

## 2015-12-30 ENCOUNTER — Emergency Department (HOSPITAL_COMMUNITY): Payer: Medicare Other

## 2015-12-30 DIAGNOSIS — I25110 Atherosclerotic heart disease of native coronary artery with unstable angina pectoris: Secondary | ICD-10-CM

## 2015-12-30 DIAGNOSIS — R079 Chest pain, unspecified: Secondary | ICD-10-CM | POA: Diagnosis not present

## 2015-12-30 DIAGNOSIS — E119 Type 2 diabetes mellitus without complications: Secondary | ICD-10-CM

## 2015-12-30 DIAGNOSIS — Z7982 Long term (current) use of aspirin: Secondary | ICD-10-CM | POA: Diagnosis not present

## 2015-12-30 DIAGNOSIS — R55 Syncope and collapse: Secondary | ICD-10-CM | POA: Diagnosis not present

## 2015-12-30 DIAGNOSIS — Z955 Presence of coronary angioplasty implant and graft: Secondary | ICD-10-CM | POA: Diagnosis not present

## 2015-12-30 DIAGNOSIS — E876 Hypokalemia: Secondary | ICD-10-CM | POA: Diagnosis present

## 2015-12-30 DIAGNOSIS — I10 Essential (primary) hypertension: Secondary | ICD-10-CM | POA: Insufficient documentation

## 2015-12-30 DIAGNOSIS — Z7984 Long term (current) use of oral hypoglycemic drugs: Secondary | ICD-10-CM | POA: Insufficient documentation

## 2015-12-30 DIAGNOSIS — I208 Other forms of angina pectoris: Secondary | ICD-10-CM | POA: Diagnosis not present

## 2015-12-30 DIAGNOSIS — R0602 Shortness of breath: Secondary | ICD-10-CM | POA: Diagnosis not present

## 2015-12-30 DIAGNOSIS — E1159 Type 2 diabetes mellitus with other circulatory complications: Secondary | ICD-10-CM

## 2015-12-30 DIAGNOSIS — I252 Old myocardial infarction: Secondary | ICD-10-CM | POA: Diagnosis not present

## 2015-12-30 DIAGNOSIS — I2510 Atherosclerotic heart disease of native coronary artery without angina pectoris: Secondary | ICD-10-CM | POA: Diagnosis present

## 2015-12-30 DIAGNOSIS — Z87891 Personal history of nicotine dependence: Secondary | ICD-10-CM | POA: Diagnosis not present

## 2015-12-30 DIAGNOSIS — R0789 Other chest pain: Secondary | ICD-10-CM

## 2015-12-30 DIAGNOSIS — E785 Hyperlipidemia, unspecified: Secondary | ICD-10-CM | POA: Diagnosis present

## 2015-12-30 DIAGNOSIS — I255 Ischemic cardiomyopathy: Secondary | ICD-10-CM | POA: Diagnosis present

## 2015-12-30 DIAGNOSIS — Z79899 Other long term (current) drug therapy: Secondary | ICD-10-CM | POA: Diagnosis not present

## 2015-12-30 LAB — CBC WITH DIFFERENTIAL/PLATELET
Basophils Absolute: 0 10*3/uL (ref 0.0–0.1)
Basophils Relative: 0 %
Eosinophils Absolute: 0.4 10*3/uL (ref 0.0–0.7)
Eosinophils Relative: 4 %
HCT: 38.2 % — ABNORMAL LOW (ref 39.0–52.0)
Hemoglobin: 12.7 g/dL — ABNORMAL LOW (ref 13.0–17.0)
Lymphocytes Relative: 40 %
Lymphs Abs: 3.5 10*3/uL (ref 0.7–4.0)
MCH: 27 pg (ref 26.0–34.0)
MCHC: 33.2 g/dL (ref 30.0–36.0)
MCV: 81.3 fL (ref 78.0–100.0)
Monocytes Absolute: 0.6 10*3/uL (ref 0.1–1.0)
Monocytes Relative: 7 %
Neutro Abs: 4.4 10*3/uL (ref 1.7–7.7)
Neutrophils Relative %: 49 %
Platelets: 225 10*3/uL (ref 150–400)
RBC: 4.7 MIL/uL (ref 4.22–5.81)
RDW: 16.5 % — ABNORMAL HIGH (ref 11.5–15.5)
WBC: 9 10*3/uL (ref 4.0–10.5)

## 2015-12-30 LAB — GLUCOSE, CAPILLARY: Glucose-Capillary: 129 mg/dL — ABNORMAL HIGH (ref 65–99)

## 2015-12-30 LAB — COMPREHENSIVE METABOLIC PANEL
ALT: 22 U/L (ref 17–63)
AST: 31 U/L (ref 15–41)
Albumin: 4.2 g/dL (ref 3.5–5.0)
Alkaline Phosphatase: 67 U/L (ref 38–126)
Anion gap: 11 (ref 5–15)
BUN: 13 mg/dL (ref 6–20)
CO2: 22 mmol/L (ref 22–32)
Calcium: 9.2 mg/dL (ref 8.9–10.3)
Chloride: 104 mmol/L (ref 101–111)
Creatinine, Ser: 1.2 mg/dL (ref 0.61–1.24)
GFR calc Af Amer: >60 mL/min (ref >60)
GFR calc non Af Amer: >60 mL/min (ref >60)
Glucose, Bld: 181 mg/dL — ABNORMAL HIGH (ref 65–99)
Potassium: 2.7 mmol/L — CL (ref 3.5–5.1)
Sodium: 137 mmol/L (ref 135–145)
Total Bilirubin: 0.6 mg/dL (ref 0.3–1.2)
Total Protein: 6.6 g/dL (ref 6.5–8.1)

## 2015-12-30 LAB — TSH: TSH: 0.354 u[IU]/mL (ref 0.350–4.500)

## 2015-12-30 LAB — T4, FREE: Free T4: 0.73 ng/dL (ref 0.61–1.12)

## 2015-12-30 LAB — BRAIN NATRIURETIC PEPTIDE: B Natriuretic Peptide: 29.4 pg/mL (ref 0.0–100.0)

## 2015-12-30 LAB — MAGNESIUM: Magnesium: 1.7 mg/dL (ref 1.7–2.4)

## 2015-12-30 LAB — I-STAT TROPONIN, ED: Troponin i, poc: 0.03 ng/mL (ref 0.00–0.08)

## 2015-12-30 LAB — POCT I-STAT TROPONIN I: Troponin i, poc: 0.04 ng/mL (ref 0.00–0.08)

## 2015-12-30 LAB — CBG MONITORING, ED: Glucose-Capillary: 151 mg/dL — ABNORMAL HIGH (ref 65–99)

## 2015-12-30 MED ORDER — OXYCODONE HCL 5 MG PO TABS
10.0000 mg | ORAL_TABLET | Freq: Four times a day (QID) | ORAL | Status: DC | PRN
Start: 2015-12-30 — End: 2016-01-01
  Administered 2015-12-30 – 2016-01-01 (×6): 10 mg via ORAL
  Filled 2015-12-30 (×6): qty 2

## 2015-12-30 MED ORDER — DIAZEPAM 5 MG PO TABS
10.0000 mg | ORAL_TABLET | Freq: Two times a day (BID) | ORAL | Status: DC | PRN
Start: 2015-12-30 — End: 2016-01-01

## 2015-12-30 MED ORDER — INSULIN ASPART 100 UNIT/ML ~~LOC~~ SOLN: 0.0000 [IU] | Freq: Every day | SUBCUTANEOUS | Status: DC

## 2015-12-30 MED ORDER — DULOXETINE HCL 60 MG PO CPEP
60.0000 mg | ORAL_CAPSULE | Freq: Every day | ORAL | Status: DC
  Administered 2015-12-31 – 2016-01-01 (×2): 60 mg via ORAL
  Filled 2015-12-30 (×2): qty 1

## 2015-12-30 MED ORDER — SODIUM CHLORIDE 0.9 % IV SOLN
INTRAVENOUS | Status: AC
  Administered 2015-12-30: 23:00:00 via INTRAVENOUS

## 2015-12-30 MED ORDER — MORPHINE SULFATE (PF) 4 MG/ML IV SOLN
2.0000 mg | Freq: Once | INTRAVENOUS | Status: AC
Start: 2015-12-30 — End: 2015-12-30
  Administered 2015-12-30: 2 mg via INTRAVENOUS
  Filled 2015-12-30: qty 1

## 2015-12-30 MED ORDER — SODIUM CHLORIDE 0.9% FLUSH
3.0000 mL | Freq: Two times a day (BID) | INTRAVENOUS | Status: DC
  Administered 2015-12-31: 3 mL via INTRAVENOUS

## 2015-12-30 MED ORDER — TRAMADOL HCL 50 MG PO TABS
50.0000 mg | ORAL_TABLET | Freq: Three times a day (TID) | ORAL | Status: DC
  Administered 2015-12-30 – 2016-01-01 (×5): 50 mg via ORAL
  Filled 2015-12-30 (×5): qty 1

## 2015-12-30 MED ORDER — PANTOPRAZOLE SODIUM 40 MG PO TBEC
40.0000 mg | DELAYED_RELEASE_TABLET | Freq: Every day | ORAL | Status: DC
  Administered 2015-12-31 – 2016-01-01 (×2): 40 mg via ORAL
  Filled 2015-12-30 (×2): qty 1

## 2015-12-30 MED ORDER — TICAGRELOR 90 MG PO TABS
90.0000 mg | ORAL_TABLET | Freq: Two times a day (BID) | ORAL | Status: DC
  Administered 2015-12-30 – 2015-12-31 (×2): 90 mg via ORAL
  Filled 2015-12-30 (×2): qty 1

## 2015-12-30 MED ORDER — PREGABALIN 100 MG PO CAPS
100.0000 mg | ORAL_CAPSULE | Freq: Two times a day (BID) | ORAL | Status: DC
  Administered 2015-12-30 – 2016-01-01 (×4): 100 mg via ORAL
  Filled 2015-12-30 (×3): qty 1
  Filled 2015-12-30: qty 2

## 2015-12-30 MED ORDER — CYCLOBENZAPRINE HCL 10 MG PO TABS
10.0000 mg | ORAL_TABLET | Freq: Three times a day (TID) | ORAL | Status: DC | PRN
  Administered 2015-12-31: 10 mg via ORAL
  Filled 2015-12-30: qty 1

## 2015-12-30 MED ORDER — ENOXAPARIN SODIUM 40 MG/0.4ML ~~LOC~~ SOLN
40.0000 mg | SUBCUTANEOUS | Status: DC
  Administered 2015-12-30 – 2015-12-31 (×2): 40 mg via SUBCUTANEOUS
  Filled 2015-12-30 (×2): qty 0.4

## 2015-12-30 MED ORDER — POTASSIUM CHLORIDE CRYS ER 20 MEQ PO TBCR
40.0000 meq | EXTENDED_RELEASE_TABLET | ORAL | Status: AC
  Administered 2015-12-30 (×2): 40 meq via ORAL
  Filled 2015-12-30 (×2): qty 2

## 2015-12-30 MED ORDER — TOPIRAMATE 100 MG PO TABS: 50.0000 mg | ORAL_TABLET | Freq: Two times a day (BID) | ORAL | Status: DC | PRN

## 2015-12-30 MED ORDER — ATORVASTATIN CALCIUM 80 MG PO TABS
80.0000 mg | ORAL_TABLET | Freq: Every day | ORAL | Status: DC
Start: 2015-12-30 — End: 2016-01-01
  Administered 2015-12-30 – 2015-12-31 (×2): 80 mg via ORAL
  Filled 2015-12-30 (×2): qty 1

## 2015-12-30 MED ORDER — ASPIRIN EC 81 MG PO TBEC
81.0000 mg | DELAYED_RELEASE_TABLET | Freq: Every day | ORAL | Status: DC
  Administered 2015-12-31: 81 mg via ORAL
  Filled 2015-12-30: qty 1

## 2015-12-30 MED ORDER — INSULIN ASPART 100 UNIT/ML ~~LOC~~ SOLN
0.0000 [IU] | Freq: Three times a day (TID) | SUBCUTANEOUS | Status: DC
  Administered 2015-12-31: 2 [IU] via SUBCUTANEOUS
  Administered 2016-01-01: 1 [IU] via SUBCUTANEOUS

## 2015-12-30 NOTE — H&P (Signed)
History and Physical    Thomas Mcclure [id number] DOB: Dec 06, 1955 DOA: 12/30/2015  PCP: Thomas Blocker, MD  Outpatient Specialists: cardiology, Dr. Tamala Mcclure Patient coming from: home  Chief Complaint: Chest pain and syncope  HPI: Thomas Mcclure is a 60 y.o. male with medical history significant of coronary artery disease status post recent NSTEMI in May 2017 with DES in RCA, chronic combined systolic and diastolic CHF, chronic back pain with spinal cord stimulator, diabetes mellitus, presents to the emergency room with chief complaint of 2 syncopal episodes today. He also has been describing intermittent chest pains yesterday as well as today. He was recently seen last month with near syncope thought to be due to his medications and his Lopressor was stopped, and then restarted as an outpatient at a lower dose. He states that he can usually feel when he is about to pass out and feels weak beforehand. He denies any fever or chills. He denies any palpitations. He denies any shortness of breath. He has no abdominal pain, no nausea, vomiting, or diarrhea. He also reports that over the last week he has been feeling extremely weak and tired, and barely able to walk to his mailbox and back due to significant tiredness. He feels like every time he tries to do something he is "wiped out".   ED Course: His vital signs are stable, his blood pressure is a little bit soft 99/70, he is afebrile, his blood work is remarkable for low potassium at 2.7 but otherwise normal. Cardiology was consulted and have seen patient in the ER, recommending observation in the hospital. [facility] was asked for admission.  Review of Systems: As per HPI otherwise 10 point review of systems negative.   Past Medical History:  Diagnosis Date  . Arthritis    Rheumatoid  . Baker's cyst    Left calf  . CAD in native artery, 07/15/15 PCI of RCA with DES 07/16/2015   a.   NSTEMI 5/17: LHC - pLAD 20, pLCx 20, OM1 30, pRCA 100, EF 45-50%>> PCI:  2.5 x 24 mm Promus DES to RCA  //  b.   Echo 5/17: mild LVH, EF 50-55%, no RWMA, mod RVE //  c. LHC 6/17: pLAD 20, pLCx 20, OM1 50, pRCA stent ok, EF 35-45% with mod sized inf wall and basal segment aneurysm  . Chronic back pain   . Cyst (solitary) of breast    ? cyst left calf ,knee  . Diabetes mellitus    diet controlled  . GERD (gastroesophageal reflux disease)   . Hyperlipidemia   . Hypertension    Dr. Antonietta Mcclure [phone]  . Ischemic cardiomyopathy    a. LV-gram at time of LHC in 6/17 with EF 35-45%  //  b. Echo 7/17: EF 45-50%, inferior HK, grade 1 diastolic dysfunction, mildly dilated aortic root, moderately reduced RVSF, mild RAE  . Myocardial infarction 2017  . 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 CARDIAC CATHETERIZATION N/A 07/15/2015   Procedure: Left Heart Cath and Coronary Angiography;  Surgeon: [name], MD;  Location: [place] [facility];  Service: Cardiovascular;  Laterality: N/A;  . CARDIAC CATHETERIZATION N/A 07/15/2015   Procedure: Coronary Stent Intervention;  Surgeon: [name], MD;  Location: [place] [facility];  Service: Cardiovascular;  Laterality: N/A;  . CARDIAC CATHETERIZATION N/A 08/07/2015   Procedure: Left Heart Cath and Coronary Angiography;  Surgeon: Lynnell Dike  Thomas Julian, MD;  Location: [place] [facility];  Service: Cardiovascular;  Laterality: N/A;  . CORONARY STENT PLACEMENT  07/2015  . KNEE SURGERY    . LUMBAR SPINE SURGERY  03/2009  & 2012  . MASS EXCISION N/A 04/19/2012   Procedure: removal of posterior cervical lipoma;  Surgeon: [name], MD;  Location: [place] [facility];  Service: Neurosurgery;  Laterality: N/A;  Removal of posterior cervical lipoma  . SPINAL CORD STIMULATOR INSERTION N/A 01/07/2013   Procedure:  SPINAL CORD STIMULATOR INSERTION;  Surgeon: [name], MD;  Location: [facility];  Service: Neurosurgery;  Laterality: N/A;  . WISDOM TOOTH EXTRACTION       hx of     reports that he has quit smoking. His smoking use included Cigarettes. He quit after 15.00 years of use. He quit smokeless tobacco use about 8 months ago. He reports that he drinks alcohol. He reports that he does not use drugs.  Allergies  Allergen Reactions  . Toradol [Ketorolac Tromethamine] Anaphylaxis    bp dropped, diaphoresis  . Methylprednisolone Other (See Comments)    B/P dropped and diorphoresis  . Prednisone Other (See Comments)    B/P dropped and patient began to sweat  . Adhesive [Tape] Rash and Other (See Comments)    "blisters" ( NO SURGICAL TAPE, PLEASE)    Family History  Problem Relation Age of Onset  . Heart disease Mother   . Prostate cancer Brother     Prior to Admission medications   Medication Sig Start Date End Date Taking? Authorizing Provider  aspirin EC 81 MG tablet Take 1 tablet (81 mg total) by mouth daily. 07/18/15  Yes Alexa Angela Burke, MD  atorvastatin (LIPITOR) 80 MG tablet TAKE 1 TABLET BY MOUTH EVERY DAY AT 6PM Patient taking differently: TAKE 1 TABLET BY MOUTH EVERY DAY AT BEDTIME 12/26/15  Yes [name], MD  BRILINTA 90 MG TABS tablet TAKE 1 TABLET BY MOUTH TWICE DAILY 12/26/15  Yes [name], MD  cyclobenzaprine (FLEXERIL) 10 MG tablet Take 10 mg by mouth 3 (three) times daily as needed for muscle spasms.    Yes Historical Provider, MD  diazepam (VALIUM) 10 MG tablet Take 10 mg by mouth 2 (two) times daily. For spasms   Yes Historical Provider, MD  diltiazem (DILACOR XR) 180 MG 24 hr capsule Take 180 mg by mouth daily. 12/13/15  Yes Historical Provider, MD  DULoxetine (CYMBALTA) 60 MG capsule Take 60 mg by mouth daily.   Yes Historical Provider, MD  furosemide (LASIX) 40 MG tablet Take 1 tablet (40 mg total) by mouth daily as needed for fluid or edema (take as needed for swelling or shortness of breath). Patient taking differently: Take 40 mg by mouth daily.  12/05/15  Yes Mauricio Gerome Apley, MD  lisinopril  (PRINIVIL,ZESTRIL) 20 MG tablet Take 20 mg by mouth at bedtime.   Yes Historical Provider, MD  metFORMIN (GLUCOPHAGE-XR) 750 MG 24 hr tablet Take 750 mg by mouth at bedtime.  06/07/15  Yes Historical Provider, MD  metoprolol tartrate (LOPRESSOR) 25 MG tablet Take 0.5 tablets (12.5 mg total) by mouth 2 (two) times daily. Patient taking differently: Take 25 mg by mouth daily.  12/19/15 03/18/16 Yes Isaiah Serge, NP  Multiple Vitamin (MULTIVITAMIN WITH MINERALS) TABS tablet Take 1 tablet by mouth daily.   Yes Historical Provider, MD  nitroGLYCERIN (NITROSTAT) 0.4 MG SL tablet Place 1 tablet (0.4 mg total) under the tongue every 5 (five) minutes  as needed for chest pain. 07/18/15  Yes Scott Joylene Draft, PA-C  omeprazole (PRILOSEC) 40 MG capsule Take 40 mg by mouth daily.   Yes Historical Provider, MD  Oxycodone HCl 10 MG TABS Take 10 mg by mouth every 6 (six) hours. scheduled   Yes Historical Provider, MD  polyethylene glycol powder (GLYCOLAX/MIRALAX) powder Take 17 g by mouth daily as needed (constipation). Mix in 8 oz water, juice, soda, coffee or tea and drink 06/07/15  Yes Historical Provider, MD  potassium chloride (K-DUR) 10 MEQ tablet Take 1 tablet (10 mEq total) by mouth 2 (two) times daily. 12/26/15  Yes Scott T Kathlen Mody, PA-C  pregabalin (LYRICA) 100 MG capsule Take 100 mg by mouth 2 (two) times daily.    Yes Historical Provider, MD  topiramate (TOPAMAX) 50 MG tablet Take 50 mg by mouth 2 (two) times daily as needed (nerve pain/ leg cramps).    Yes Historical Provider, MD  traMADol (ULTRAM) 50 MG tablet Take 50 mg by mouth 3 (three) times daily. scheduled   Yes Historical Provider, MD  lisinopril (PRINIVIL,ZESTRIL) 10 MG tablet Take 1 tablet (10 mg total) by mouth daily. Patient not taking: Reported on 12/30/2015 12/06/15   Tawni Millers, MD    Physical Exam: Vitals:   12/30/15 1521 12/30/15 1523 12/30/15 1527 12/30/15 1530  BP: 113/73   99/70  Pulse: 77  74 73  Resp: 10  12 12   Temp: 98  F (36.7 C)     TempSrc: Oral     SpO2: 100% 100% 96% 97%  Weight:  99.3 kg (219 lb)    Height:  5\' 10"  (1.778 m)        Constitutional: NAD, calm, comfortable Vitals:   12/30/15 1521 12/30/15 1523 12/30/15 1527 12/30/15 1530  BP: 113/73   99/70  Pulse: 77  74 73  Resp: 10  12 12   Temp: 98 F (36.7 C)     TempSrc: Oral     SpO2: 100% 100% 96% 97%  Weight:  99.3 kg (219 lb)    Height:  5\' 10"  (1.778 m)     Eyes: PERRL, lids and conjunctivae normal ENMT: Mucous membranes are moist. Posterior pharynx clear of any exudate or lesions. Neck: normal, supple Respiratory: clear to auscultation bilaterally, no wheezing, no crackles. Normal respiratory effort. Cardiovascular: Regular rate and rhythm, no murmurs / rubs / gallops. No extremity edema. 2+ pedal pulses.  Abdomen: no tenderness, no masses palpated. Bowel sounds positive.  Musculoskeletal: no clubbing / cyanosis. Normal muscle tone.  Skin: no rashes, lesions, ulcers. No induration Neurologic: Nonfocal, symmetric strength Psychiatric: Normal judgment and insight. Alert and oriented x 3. Normal mood.   Labs on Admission: I have personally reviewed following labs and imaging studies  CBC:  Recent Labs Lab 12/30/15 1520  WBC 9.0  NEUTROABS 4.4  HGB 12.7*  HCT 38.2*  MCV 81.3  PLT 123456   Basic Metabolic Panel:  Recent Labs Lab 12/30/15 1520  NA 137  K 2.7*  CL 104  CO2 22  GLUCOSE 181*  BUN 13  CREATININE 1.20  CALCIUM 9.2  MG 1.7   GFR: Estimated Creatinine Clearance: 77.3 mL/min (by C-G formula based on SCr of 1.2 mg/dL). Liver Function Tests:  Recent Labs Lab 12/30/15 1520  AST 31  ALT 22  ALKPHOS 67  BILITOT 0.6  PROT 6.6  ALBUMIN 4.2   No results for input(s): LIPASE, AMYLASE in the last 168 hours. No results for input(s): AMMONIA in the  last 168 hours. Coagulation Profile: No results for input(s): INR, PROTIME in the last 168 hours. Cardiac Enzymes: No results for input(s): CKTOTAL,  CKMB, CKMBINDEX, TROPONINI in the last 168 hours. BNP (last 3 results) No results for input(s): PROBNP in the last 8760 hours. HbA1C: No results for input(s): HGBA1C in the last 72 hours. CBG:  Recent Labs Lab 12/24/15 1335 12/24/15 1414 12/30/15 1732  GLUCAP 124* 126* 151*   Lipid Profile: No results for input(s): CHOL, HDL, LDLCALC, TRIG, CHOLHDL, LDLDIRECT in the last 72 hours. Thyroid Function Tests: No results for input(s): TSH, T4TOTAL, FREET4, T3FREE, THYROIDAB in the last 72 hours. Anemia Panel: No results for input(s): VITAMINB12, FOLATE, FERRITIN, TIBC, IRON, RETICCTPCT in the last 72 hours. Urine analysis:    Component Value Date/Time   COLORURINE YELLOW 12/03/2015 1805   APPEARANCEUR CLEAR 12/03/2015 1805   LABSPEC 1.009 12/03/2015 1805   PHURINE 6.0 12/03/2015 1805   GLUCOSEU NEGATIVE 12/03/2015 1805   HGBUR NEGATIVE 12/03/2015 1805   BILIRUBINUR NEGATIVE 12/03/2015 1805   KETONESUR NEGATIVE 12/03/2015 1805   PROTEINUR NEGATIVE 12/03/2015 1805   UROBILINOGEN 1.0 07/26/2012 1552   NITRITE NEGATIVE 12/03/2015 1805   LEUKOCYTESUR NEGATIVE 12/03/2015 1805   Sepsis Labs: @LABRCNTIP (procalcitonin:4,lacticidven:4) )No results found for this or any previous visit (from the past 240 hour(s)).   Radiological Exams on Admission: Dg Chest 2 View  Result Date: 12/30/2015 CLINICAL DATA:  Chest pain EXAM: CHEST  2 VIEW COMPARISON:  12/03/2015 chest radiograph. FINDINGS: Spinal stimulator device overlies the mid to lower thoracic spine. Stable cardiomediastinal silhouette with normal heart size. No pneumothorax. No pleural effusion. Lungs appear clear, with no acute consolidative airspace disease and no pulmonary edema. IMPRESSION: No active cardiopulmonary disease. Electronically Signed   By: Ilona Sorrel M.D.   On: 12/30/2015 16:44    EKG: Independently reviewed. Sinus rhythm  Assessment/Plan Active Problems:   History of non-ST elevation myocardial infarction  (NSTEMI)   Hypertension   Hyperlipidemia   Diabetes mellitus (HCC)   CAD (coronary artery disease)   Hypokalemia   Ischemic cardiomyopathy   Chest pain   Syncope   Chest pain with syncope - Patient with extensive coronary artery disease, cardiology consult in the emergency room, will admit patient to telemetry, monitor serial cardiac enzymes   Coronary artery disease - Cardiology following, patient reports significant decrease in functional status and significant exertional symptoms over the last week, would repeat a 2-D echo  Hypertension - Patient with soft blood pressure on admission, hold beta blockers, diltiazem as well as lisinopril. Check orthostatic vital signs in the morning. Gentle hydration with normal saline 75 mL per hour for 10 hours only to prevent fluid overload.  Diabetes mellitus - Most recent hemoglobin A1c last month was 6.8, showing good control, place patient on sliding scale insulin while hospitalized  Hypokalemia - replete and recheck in am   Chronic pain - Resume home medications, replace narcotics as PRN rather than scheduled   DVT prophylaxis: Lovenox  Code Status: Full  Family Communication: d/w wife bedside Disposition Plan: admit to tele Consults called: cardiology  Admission status: observation    Marzetta Board, MD [facility] Pager [phone]  If 7PM-7AM, please contact night-coverage [email or web address] Password [facility]  12/30/2015, 5:54 PM

## 2015-12-30 NOTE — Consult Note (Addendum)
Primary cardiologist:Dr Pernell Dupre Consulting cardiologist:Dr Carlyle Dolly Requesting physicinan: Dr Idolina Primer Indication: chest pain, syncope  Clinical Summary Thomas Mcclure is a 60 y.o.male history of DM2, HTN, HL, CAD with prior NSTEMI 06/2015 where he received DES to RCA, repeat cath 07/2015 after presenting with chest pain with patent stent and coronaries. He has chronically mildly elevated troponins from last few admission. Echo 08/2015 LVEF 45-50%.    Presents with chest pain and syncope. Seen 11/2015 with chest pain and near syncope, mild trop elevation 0.05 to 0.06. Negative CTA that admission. Near syncope thought to be due to bradycardia and low bp's that were  medication related, his lopressor, dilt and chlorthalidone were stopped, lisinopril lowered to 10mg  and later 5mg . Low dose lopressor restarted at last outpatient visit.   Presents after syncopal episode while at [employer]. He reports he was sitting down when episode occurred. Family states his eyes rolled back and he slumped over. Came to in just a few minutes. No seizure like activity.  He continues to have chronic chest pain. Dull pain 5/10 midchest, lasts just a few seconds. Can occur at rest or with activity. Can have some associated sob.    Hgb 12.7, Plt 225, WBC 9, CMET pending,  istat trop neg CXR pending EKG SR, inferior Qwaves, chronic anterior nonspecifici ST/T changes.   Allergies  Allergen Reactions  . Toradol [Ketorolac Tromethamine] Anaphylaxis    bp dropped, diaphoresis  . Methylprednisolone Other (See Comments)    B/P dropped and diorphoresis  . Prednisone Other (See Comments)    B/P dropped and patient began to sweat  . Adhesive [Tape] Rash and Other (See Comments)    "blisters" ( NO SURGICAL TAPE, PLEASE)  . Other Other (See Comments)    Allergic reaction to steroid (Prednisone??) Unsure of name B/P dropped and patient began to sweat    Medications Scheduled  Medications:    Infusions:    PRN Medications:     Past Medical History:  Diagnosis Date  . Arthritis    Rheumatoid  . Baker's cyst    Left calf  . CAD in native artery, 07/15/15 PCI of RCA with DES 07/16/2015   a.   NSTEMI 5/17: LHC - pLAD 20, pLCx 20, OM1 30, pRCA 100, EF 45-50%>> PCI: 2.5 x 24 mm Promus DES to RCA  //  b.   Echo 5/17: mild LVH, EF 50-55%, no RWMA, mod RVE //  c. LHC 6/17: pLAD 20, pLCx 20, OM1 50, pRCA stent ok, EF 35-45% with mod sized inf wall and basal segment aneurysm  . Chronic back pain   . Cyst (solitary) of breast    ? cyst left calf ,knee  . Diabetes mellitus    diet controlled  . GERD (gastroesophageal reflux disease)   . Hyperlipidemia   . Hypertension    Dr. [name] [phone]  . Ischemic cardiomyopathy    a. LV-gram at time of LHC in 6/17 with EF 35-45%  //  b. Echo 7/17: EF 45-50%, inferior HK, grade 1 diastolic dysfunction, mildly dilated aortic root, moderately reduced RVSF, mild RAE  . Myocardial infarction 2017  . 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 CARDIAC CATHETERIZATION N/A 07/15/2015   Procedure: Left Heart Cath and Coronary Angiography;  Surgeon: [name], MD;  Location: [place] [facility];  Service: Cardiovascular;  Laterality: N/A;  . CARDIAC CATHETERIZATION N/A 07/15/2015  Procedure: Coronary Stent Intervention;  Surgeon: [name], MD;  Location: [place] [facility];  Service: Cardiovascular;  Laterality: N/A;  . CARDIAC CATHETERIZATION N/A 08/07/2015   Procedure: Left Heart Cath and Coronary Angiography;  Surgeon: [name], MD;  Location: [place] [facility];  Service: Cardiovascular;  Laterality: N/A;  . CORONARY STENT PLACEMENT  07/2015  . KNEE SURGERY    . LUMBAR SPINE SURGERY  03/2009  & 2012  . MASS EXCISION N/A 04/19/2012   Procedure: removal of posterior cervical lipoma;  Surgeon: [name], MD;  Location: [place] [facility];   Service: Neurosurgery;  Laterality: N/A;  Removal of posterior cervical lipoma  . SPINAL CORD STIMULATOR INSERTION N/A 01/07/2013   Procedure:  SPINAL CORD STIMULATOR INSERTION;  Surgeon: [name], MD;  Location: [facility];  Service: Neurosurgery;  Laterality: N/A;  . WISDOM TOOTH EXTRACTION     hx of    Family History  Problem Relation Age of Onset  . Heart disease Mother   . Prostate cancer Brother     Social History Thomas Mcclure reports that he has quit smoking. His smoking use included Cigarettes. He quit after 15.00 years of use. He quit smokeless tobacco use about 8 months ago. Thomas Mcclure reports that he drinks alcohol.  Review of Systems CONSTITUTIONAL: No weight loss, fever, chills, weakness or fatigue.  HEENT: Eyes: No visual loss, blurred vision, double vision or yellow sclerae. No hearing loss, sneezing, congestion, runny nose or sore throat.  SKIN: No rash or itching.  CARDIOVASCULAR: per hpi RESPIRATORY: No shortness of breath, cough or sputum.  GASTROINTESTINAL: No anorexia, nausea, vomiting or diarrhea. No abdominal pain or blood.  GENITOURINARY: no polyuria, no dysuria NEUROLOGICAL: per hpi MUSCULOSKELETAL: No muscle, back pain, joint pain or stiffness.  HEMATOLOGIC: No anemia, bleeding or bruising.  LYMPHATICS: No enlarged nodes. No history of splenectomy.  PSYCHIATRIC: No history of depression or anxiety.      Physical Examination Blood pressure 99/70, pulse 73, temperature 98 F (36.7 C), temperature source Oral, resp. rate 12, height 5\' 10"  (1.778 m), weight 219 lb (99.3 kg), SpO2 97 %. No intake or output data in the 24 hours ending 12/30/15 1613  HEENT: sclera clear, throat clear  Cardiovascular: RRR, no m/r/g, no jvd  Respiratory: CTAB  GI: abdomen soft, NT, ND  MSK: no LE edema  Neuro: no focal deficts  Psych: appropriate affect   Lab Results  Basic Metabolic Panel: No results for input(s): NA, K, CL, CO2, GLUCOSE, BUN, CREATININE,  CALCIUM, MG, PHOS in the last 168 hours.  Liver Function Tests: No results for input(s): AST, ALT, ALKPHOS, BILITOT, PROT, ALBUMIN in the last 168 hours.  CBC:  Recent Labs Lab 12/30/15 1520  WBC 9.0  NEUTROABS 4.4  HGB 12.7*  HCT 38.2*  MCV 81.3  PLT 225    Cardiac Enzymes: No results for input(s): CKTOTAL, CKMB, CKMBINDEX, TROPONINI in the last 168 hours.  BNP: Invalid input(s): POCBNP     Impression/Recommendations 1. Chest pain - several month history of ongoing chest pain. Previous stent 06/2015, repeat cath for ongoing chest pain 07/2015 without significant disease - history of chronically elevated troponins from last few hospital admission - negative CTA for aortic pathology 11/2015 - EKG without acute ischemic changes. Can cycle EKGs and enzymes overnight. Recommend starting ranexa 500mg  bid. He has had issues with low bp's in the past, would avoid aggressive titration of other antianginals - symptoms fairly atypical, would not  pursue repeat ischemic testing unless significant changes in trop or EKG  2. Syncope - unclear cause - order orthostatics. Monitor tele overnight - if negative tele would consider home event monitor  3. Hypokalemia - per medicine team. Recurrent issue for patient.   4. CAD - continue home meds  Recommend admission to medicine team overnight for syncope, we will follow from cardiac perspective.    Carlyle Dolly, M.D.

## 2015-12-30 NOTE — ED Provider Notes (Signed)
[facility] DEPT Provider Note   CSN: [id number] Arrival date & time: 12/30/15  1508     History   Chief Complaint Chief Complaint  Patient presents with  . Chest Pain  . Loss of Consciousness    HPI Thomas Mcclure is a 60 y.o. male.  HPI   Ate at [employer], ate full meal and suddenly seemed lightheaded then eyes rolled back, out for 3-4 minutes. Bottom lip trembled, no other shaking, no incontinence. Came back for a little bit then went out again.  Then out for 5-6 minutes.  No color change. Wife reports he fell into her and she caught him in sitting position and he did not hit his head.   When he came back at cafeteria nurse thought he had irregular heart rhythm and gave nitro. Received nitro and aspirin with EMS. Reports nitro helped CP but causes headache.    Chest had been hurting all morning. Was ordained this AM, and didn't talk about it. Chest pain started yesterday, aching, middle of chest, comes and goes.  Reports same pain since had catherization, but different than before catheterization.  Legs hurt from sciatic nerve. Takes lyrica and cymbalta.    Past Medical History:  Diagnosis Date  . Arthritis    Rheumatoid  . Baker's cyst    Left calf  . CAD in native artery, 07/15/15 PCI of RCA with DES 07/16/2015   a.   NSTEMI 5/17: LHC - pLAD 20, pLCx 20, OM1 30, pRCA 100, EF 45-50%>> PCI: 2.5 x 24 mm Promus DES to RCA  //  b.   Echo 5/17: mild LVH, EF 50-55%, no RWMA, mod RVE //  c. LHC 6/17: pLAD 20, pLCx 20, OM1 50, pRCA stent ok, EF 35-45% with mod sized inf wall and basal segment aneurysm  . Chronic back pain   . Cyst (solitary) of breast    ? cyst left calf ,knee  . Diabetes mellitus    diet controlled  . GERD (gastroesophageal reflux disease)   . Hyperlipidemia   . Hypertension    Dr. [name] [phone]  . Ischemic cardiomyopathy    a. LV-gram at time of LHC in 6/17 with EF 35-45%  //  b. Echo 7/17: EF 45-50%, inferior HK, grade 1 diastolic dysfunction,  mildly dilated aortic root, moderately reduced RVSF, mild RAE  . Myocardial infarction 2017  . 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 Syncope 12/30/2015  . Near syncope   . Bradycardia   . Chest pain 12/03/2015  . Left ventricular aneurysm 12/03/2015  . Ischemic cardiomyopathy   . Other fatigue 08/24/2015  . Pain in the chest   . Unstable angina ([place]) 08/06/2015  . CAD (coronary artery disease) 07/16/2015  . Hypokalemia 07/16/2015  . History of non-ST elevation myocardial infarction (NSTEMI) 07/15/2015  . Hypertension 07/15/2015  . Hyperlipidemia 07/15/2015  . Chronic back pain 07/15/2015  . Diabetes mellitus ([place]) 07/15/2015    Past Surgical History:  Procedure Laterality Date  . CARDIAC CATHETERIZATION N/A 07/15/2015   Procedure: Left Heart Cath and Coronary Angiography;  Surgeon: [name], MD;  Location: [facility];  Service: Cardiovascular;  Laterality: N/A;  . CARDIAC CATHETERIZATION N/A 07/15/2015   Procedure: Coronary Stent Intervention;  Surgeon: [name], MD;  Location: [facility];  Service: Cardiovascular;  Laterality: N/A;  . CARDIAC CATHETERIZATION N/A 08/07/2015   Procedure:  Left Heart Cath and Coronary Angiography;  Surgeon: [name], MD;  Location: [facility];  Service: Cardiovascular;  Laterality: N/A;  . CORONARY STENT PLACEMENT  07/2015  . KNEE SURGERY    . LUMBAR SPINE SURGERY  03/2009  & 2012  . MASS EXCISION N/A 04/19/2012   Procedure: removal of posterior cervical lipoma;  Surgeon: [name], MD;  Location: [facility];  Service: Neurosurgery;  Laterality: N/A;  Removal of posterior cervical lipoma  . SPINAL CORD STIMULATOR INSERTION N/A 01/07/2013   Procedure:  SPINAL CORD STIMULATOR INSERTION;  Surgeon: [name], MD;  Location: [facility];  Service: Neurosurgery;  Laterality: N/A;  . WISDOM TOOTH EXTRACTION     hx of        Home Medications    Prior to Admission medications   Medication Sig Start Date End Date Taking? Authorizing Provider  aspirin EC 81 MG tablet Take 1 tablet (81 mg total) by mouth daily. 07/18/15  Yes Alexa Angela Burke, MD  atorvastatin (LIPITOR) 80 MG tablet TAKE 1 TABLET BY MOUTH EVERY DAY AT 6PM Patient taking differently: TAKE 1 TABLET BY MOUTH EVERY DAY AT BEDTIME 12/26/15  Yes [name], MD  BRILINTA 90 MG TABS tablet TAKE 1 TABLET BY MOUTH TWICE DAILY 12/26/15  Yes [name], MD  cyclobenzaprine (FLEXERIL) 10 MG tablet Take 10 mg by mouth 3 (three) times daily as needed for muscle spasms.    Yes Historical Provider, MD  diazepam (VALIUM) 10 MG tablet Take 10 mg by mouth 2 (two) times daily. For spasms   Yes Historical Provider, MD  diltiazem (DILACOR XR) 180 MG 24 hr capsule Take 180 mg by mouth daily. 12/13/15  Yes Historical Provider, MD  DULoxetine (CYMBALTA) 60 MG capsule Take 60 mg by mouth daily.   Yes Historical Provider, MD  furosemide (LASIX) 40 MG tablet Take 1 tablet (40 mg total) by mouth daily as needed for fluid or edema (take as needed for swelling or shortness of breath). Patient taking differently: Take 40 mg by mouth daily.  12/05/15  Yes Mauricio Gerome Apley, MD  lisinopril (PRINIVIL,ZESTRIL) 20 MG tablet Take 20 mg by mouth at bedtime.   Yes Historical Provider, MD  metFORMIN (GLUCOPHAGE-XR) 750 MG 24 hr tablet Take 750 mg by mouth at bedtime.  06/07/15  Yes Historical Provider, MD  metoprolol tartrate (LOPRESSOR) 25 MG tablet Take 0.5 tablets (12.5 mg total) by mouth 2 (two) times daily. Patient taking differently: Take 25 mg by mouth daily.  12/19/15 03/18/16 Yes Isaiah Serge, NP  Multiple Vitamin (MULTIVITAMIN WITH MINERALS) TABS tablet Take 1 tablet by mouth daily.   Yes Historical Provider, MD  nitroGLYCERIN (NITROSTAT) 0.4 MG SL tablet Place 1 tablet (0.4 mg total) under the tongue every 5 (five) minutes as needed for chest pain. 07/18/15  Yes Scott Joylene Draft, PA-C  omeprazole (PRILOSEC) 40 MG capsule Take 40 mg by mouth daily.   Yes Historical Provider, MD  Oxycodone HCl 10 MG TABS Take 10 mg by mouth every 6 (six) hours. scheduled   Yes Historical Provider, MD  polyethylene glycol powder (GLYCOLAX/MIRALAX) powder Take 17 g by mouth daily as needed (constipation). Mix in 8 oz water, juice, soda, coffee or tea and drink 06/07/15  Yes Historical Provider, MD  potassium chloride (K-DUR) 10 MEQ tablet Take 1 tablet (10 mEq total) by mouth 2 (two) times daily. 12/26/15  Yes Scott Joylene Draft, PA-C  pregabalin (LYRICA) 100  MG capsule Take 100 mg by mouth 2 (two) times daily.    Yes Historical Provider, MD  topiramate (TOPAMAX) 50 MG tablet Take 50 mg by mouth 2 (two) times daily as needed (nerve pain/ leg cramps).    Yes Historical Provider, MD  traMADol (ULTRAM) 50 MG tablet Take 50 mg by mouth 3 (three) times daily. scheduled   Yes Historical Provider, MD  lisinopril (PRINIVIL,ZESTRIL) 10 MG tablet Take 1 tablet (10 mg total) by mouth daily. Patient not taking: Reported on 12/30/2015 12/06/15   Tawni Millers, MD    Family History Family History  Problem Relation Age of Onset  . Heart disease Mother   . Prostate cancer Brother     Social History Social History  Substance Use Topics  . Smoking status: Former Smoker    Years: 15.00    Types: Cigarettes  . Smokeless tobacco: Former Systems developer    Quit date: 04/08/2015     Comment: 5 cig a week  . Alcohol use 0.0 oz/week     Comment: Couple 40 ounces daily until 2012     Allergies   Toradol [ketorolac tromethamine]; Methylprednisolone; Prednisone; and Adhesive [tape]   Review of Systems Review of Systems  Constitutional: Negative for fever.  HENT: Negative for congestion and sore throat.   Eyes: Negative for visual disturbance.  Respiratory: Positive for shortness of breath (comes and goes for a long time since cath in may). Negative for cough.   Cardiovascular: Positive for chest  pain. Negative for leg swelling.  Gastrointestinal: Negative for abdominal pain, nausea and vomiting.  Genitourinary: Negative for difficulty urinating.  Musculoskeletal: Positive for arthralgias and back pain. Negative for neck stiffness.  Skin: Negative for rash.  Neurological: Positive for syncope and numbness (right and left leg since prior surgery, not changed). Negative for headaches.     Physical Exam Updated Vital Signs BP (!) 116/95 (BP Location: Right Arm)   Pulse 87   Temp 98.6 F (37 C)   Resp 11   Ht 5\' 10"  (1.778 m)   Wt 218 lb 9.6 oz (99.2 kg)   SpO2 99%   BMI 31.37 kg/m   Physical Exam  Constitutional: He is oriented to person, place, and time. He appears well-developed and well-nourished. No distress.  Fatigue   HENT:  Head: Normocephalic and atraumatic.  Eyes: Conjunctivae and EOM are normal.  Neck: Normal range of motion.  Cardiovascular: Normal rate, regular rhythm, normal heart sounds and intact distal pulses.  Exam reveals no gallop and no friction rub.   No murmur heard. Pulmonary/Chest: Effort normal. No respiratory distress. He has no wheezes. He has rales.  Abdominal: Soft. He exhibits no distension. There is no tenderness. There is no guarding.  Musculoskeletal: He exhibits no edema.  Neurological: He is alert and oriented to person, place, and time. Sensory deficit: chronic sensory changes lower ext. GCS eye subscore is 4. GCS verbal subscore is 5. GCS motor subscore is 6.  Generalized weakness, no drift  Skin: Skin is warm and dry. He is not diaphoretic.  Nursing note and vitals reviewed.    ED Treatments / Results  Labs (all labs ordered are listed, but only abnormal results are displayed) Labs Reviewed  CBC WITH DIFFERENTIAL/PLATELET - Abnormal; Notable for the following:       Result Value   Hemoglobin 12.7 (*)    HCT 38.2 (*)    RDW 16.5 (*)    All other components within normal limits  COMPREHENSIVE METABOLIC PANEL -  Abnormal;  Notable for the following:    Potassium 2.7 (*)    Glucose, Bld 181 (*)    All other components within normal limits  COMPREHENSIVE METABOLIC PANEL - Abnormal; Notable for the following:    Glucose, Bld 112 (*)    Total Protein 6.2 (*)    All other components within normal limits  CBC - Abnormal; Notable for the following:    Hemoglobin 12.6 (*)    HCT 37.8 (*)    RDW 17.0 (*)    All other components within normal limits  TROPONIN I - Abnormal; Notable for the following:    Troponin I 0.05 (*)    All other components within normal limits  TROPONIN I - Abnormal; Notable for the following:    Troponin I 0.05 (*)    All other components within normal limits  GLUCOSE, CAPILLARY - Abnormal; Notable for the following:    Glucose-Capillary 129 (*)    All other components within normal limits  GLUCOSE, CAPILLARY - Abnormal; Notable for the following:    Glucose-Capillary 165 (*)    All other components within normal limits  CBG MONITORING, ED - Abnormal; Notable for the following:    Glucose-Capillary 151 (*)    All other components within normal limits  BRAIN NATRIURETIC PEPTIDE  MAGNESIUM  TSH  T4, FREE  GLUCOSE, CAPILLARY  I-STAT TROPOININ, ED  I-STAT TROPOININ, ED  POCT I-STAT TROPONIN I  I-STAT TROPOININ, ED    EKG  EKG Interpretation  Date/Time:  Sunday December 30 2015 15:19:07 EST Ventricular Rate:  80 PR Interval:    QRS Duration: 109 QT Interval:  428 QTC Calculation: 494 R Axis:   -30 Text Interpretation:  Sinus rhythm Inferior infarct, old Nonspecific T wave changes No significant change since last tracing Confirmed by [facility] MD, Alexander ([id number]) on 12/30/2015 3:21:54 PM       Radiology Dg Chest 2 View  Result Date: 12/30/2015 CLINICAL DATA:  Chest pain EXAM: CHEST  2 VIEW COMPARISON:  12/03/2015 chest radiograph. FINDINGS: Spinal stimulator device overlies the mid to lower thoracic spine. Stable cardiomediastinal silhouette with normal heart size. No  pneumothorax. No pleural effusion. Lungs appear clear, with no acute consolidative airspace disease and no pulmonary edema. IMPRESSION: No active cardiopulmonary disease. Electronically Signed   By: Ilona Sorrel M.D.   On: 12/30/2015 16:44    Procedures Procedures (including critical care time)  Medications Ordered in ED Medications  enoxaparin (LOVENOX) injection 40 mg (40 mg Subcutaneous Given 12/30/15 2312)  sodium chloride flush (NS) 0.9 % injection 3 mL (not administered)  atorvastatin (LIPITOR) tablet 80 mg (80 mg Oral Given 12/30/15 2310)  ticagrelor (BRILINTA) tablet 90 mg (90 mg Oral Given 12/31/15 0838)  oxyCODONE (Oxy IR/ROXICODONE) immediate release tablet 10 mg (10 mg Oral Given 12/31/15 0838)  aspirin EC tablet 81 mg (81 mg Oral Given 12/31/15 0838)  diazepam (VALIUM) tablet 10 mg (not administered)  traMADol (ULTRAM) tablet 50 mg (50 mg Oral Given 12/31/15 0839)  cyclobenzaprine (FLEXERIL) tablet 10 mg (not administered)  DULoxetine (CYMBALTA) DR capsule 60 mg (60 mg Oral Given 12/31/15 0840)  pantoprazole (PROTONIX) EC tablet 40 mg (40 mg Oral Given 12/31/15 0839)  pregabalin (LYRICA) capsule 100 mg (100 mg Oral Given 12/31/15 0839)  topiramate (TOPAMAX) tablet 50 mg (not administered)  insulin aspart (novoLOG) injection 0-9 Units (2 Units Subcutaneous Given 12/31/15 0840)  insulin aspart (novoLOG) injection 0-5 Units (0 Units Subcutaneous Not Given 12/30/15 2226)  0.9 %  sodium  chloride infusion ( Intravenous New Bag/Given 12/30/15 2315)  magnesium sulfate IVPB 2 g 50 mL (not administered)  morphine 4 MG/ML injection 2 mg (2 mg Intravenous Given 12/30/15 1557)  potassium chloride SA (K-DUR,KLOR-CON) CR tablet 40 mEq (40 mEq Oral Given 12/30/15 2309)     Initial Impression / Assessment and Plan / ED Course  I have reviewed the triage vital signs and the nursing notes.  Pertinent labs & imaging results that were available during my care of the patient were reviewed by me and  considered in my medical decision making (see chart for details).  Clinical Course      60 year old male with a history of coronary artery disease with NSTEMI May 2017, diabetes, hypertension, hyperlipidemia, chronic back pain, admission June 2017 with repeat left heart cath without significant coronary artery disease and patent RCA 8%, recent admission 12/03/2015 for concern of chest pain and dyspnea with episode of bradycardia and hypotension which was thought to be secondary to medications during the hospitalization, combined systolic and diastolic CHF, presents with concern for syncope and chest pain.  Doubt PE given no hypoxia, on brilinta, no asymmetric leg swelling.  EKG unchanged, troponin negative. No seizure like activity. No focal neurologic changes.   Consulted Cardiology, Dr. Stanford Breed who will evaluate the patient. Recommend syncope observation admission to hospitalist and will follow.  K replacement ordered for hypokalemia.  Final Clinical Impressions(s) / ED Diagnoses   Final diagnoses:  Chest pain, unspecified type  Syncope and collapse    New Prescriptions Current Discharge Medication List       Gareth Morgan, MD 12/31/15 1126

## 2015-12-30 NOTE — ED Triage Notes (Signed)
Pt arrives from [employer] via [facility] reporting witnessed syncopal episode with CP.  Pt reports some CP yesterday that resolved, reports CP today.  Pt denies dizziness/ lightheadedness preceding LOC, reports generalized weakness.  Pt reports new SOB on arrival, sats 100% RA.  EMS reports giving 324 ASA, pt refused Nitro.

## 2015-12-31 ENCOUNTER — Encounter (HOSPITAL_COMMUNITY): Payer: Medicare Other

## 2015-12-31 ENCOUNTER — Other Ambulatory Visit (HOSPITAL_COMMUNITY): Payer: Medicare Other

## 2015-12-31 ENCOUNTER — Observation Stay (HOSPITAL_BASED_OUTPATIENT_CLINIC_OR_DEPARTMENT_OTHER): Payer: Medicare Other

## 2015-12-31 DIAGNOSIS — E784 Other hyperlipidemia: Secondary | ICD-10-CM

## 2015-12-31 DIAGNOSIS — I25118 Atherosclerotic heart disease of native coronary artery with other forms of angina pectoris: Secondary | ICD-10-CM

## 2015-12-31 DIAGNOSIS — R079 Chest pain, unspecified: Secondary | ICD-10-CM | POA: Diagnosis not present

## 2015-12-31 DIAGNOSIS — I208 Other forms of angina pectoris: Secondary | ICD-10-CM

## 2015-12-31 DIAGNOSIS — I252 Old myocardial infarction: Secondary | ICD-10-CM | POA: Diagnosis not present

## 2015-12-31 DIAGNOSIS — R55 Syncope and collapse: Secondary | ICD-10-CM | POA: Diagnosis not present

## 2015-12-31 DIAGNOSIS — I2510 Atherosclerotic heart disease of native coronary artery without angina pectoris: Secondary | ICD-10-CM | POA: Diagnosis not present

## 2015-12-31 DIAGNOSIS — I10 Essential (primary) hypertension: Secondary | ICD-10-CM

## 2015-12-31 LAB — CBC
HCT: 37.8 % — ABNORMAL LOW (ref 39.0–52.0)
Hemoglobin: 12.6 g/dL — ABNORMAL LOW (ref 13.0–17.0)
MCH: 27.3 pg (ref 26.0–34.0)
MCHC: 33.3 g/dL (ref 30.0–36.0)
MCV: 81.8 fL (ref 78.0–100.0)
Platelets: 219 10*3/uL (ref 150–400)
RBC: 4.62 MIL/uL (ref 4.22–5.81)
RDW: 17 % — ABNORMAL HIGH (ref 11.5–15.5)
WBC: 7 10*3/uL (ref 4.0–10.5)

## 2015-12-31 LAB — COMPREHENSIVE METABOLIC PANEL
ALT: 20 U/L (ref 17–63)
AST: 24 U/L (ref 15–41)
Albumin: 3.6 g/dL (ref 3.5–5.0)
Alkaline Phosphatase: 60 U/L (ref 38–126)
Anion gap: 9 (ref 5–15)
BUN: 9 mg/dL (ref 6–20)
CO2: 26 mmol/L (ref 22–32)
Calcium: 8.9 mg/dL (ref 8.9–10.3)
Chloride: 105 mmol/L (ref 101–111)
Creatinine, Ser: 0.86 mg/dL (ref 0.61–1.24)
GFR calc Af Amer: >60 mL/min (ref >60)
GFR calc non Af Amer: >60 mL/min (ref >60)
Glucose, Bld: 112 mg/dL — ABNORMAL HIGH (ref 65–99)
Potassium: 3.5 mmol/L (ref 3.5–5.1)
Sodium: 140 mmol/L (ref 135–145)
Total Bilirubin: 0.7 mg/dL (ref 0.3–1.2)
Total Protein: 6.2 g/dL — ABNORMAL LOW (ref 6.5–8.1)

## 2015-12-31 LAB — GLUCOSE, CAPILLARY
Glucose-Capillary: 165 mg/dL — ABNORMAL HIGH (ref 65–99)
Glucose-Capillary: 95 mg/dL (ref 65–99)
Glucose-Capillary: 119 mg/dL — ABNORMAL HIGH (ref 65–99)
Glucose-Capillary: 153 mg/dL — ABNORMAL HIGH (ref 65–99)

## 2015-12-31 LAB — TROPONIN I
Troponin I: 0.05 ng/mL (ref <0.03)
Troponin I: 0.05 ng/mL (ref <0.03)

## 2015-12-31 LAB — D-DIMER, QUANTITATIVE: D-Dimer, Quant: 0.36 ug/mL-FEU (ref 0.00–0.50)

## 2015-12-31 MED ORDER — MAGNESIUM SULFATE 2 GM/50ML IV SOLN
2.0000 g | Freq: Once | INTRAVENOUS | Status: AC
  Administered 2015-12-31: 2 g via INTRAVENOUS
  Filled 2015-12-31: qty 50

## 2015-12-31 MED ORDER — CLOPIDOGREL BISULFATE 75 MG PO TABS
150.0000 mg | ORAL_TABLET | Freq: Once | ORAL | Status: AC
  Administered 2015-12-31: 150 mg via ORAL
  Filled 2015-12-31: qty 2

## 2015-12-31 MED ORDER — CLOPIDOGREL BISULFATE 75 MG PO TABS
75.0000 mg | ORAL_TABLET | Freq: Every day | ORAL | Status: DC
  Administered 2016-01-01: 75 mg via ORAL
  Filled 2015-12-31: qty 1

## 2015-12-31 NOTE — Care Management Obs Status (Signed)
[facility] NOTIFICATION   Patient Details  Name: DAVISON APODACA MRN: [id number] Date of Birth: 06-19-1955   Medicare Observation Status Notification Given:  Yes    Bethena Roys, RN 12/31/2015, 11:44 AM

## 2015-12-31 NOTE — Progress Notes (Signed)
Patient Name: Thomas Mcclure Date of Encounter: 12/31/2015  Primary Cardiologist: Dr. Laurena Bering Problem List     Active Problems:   History of non-ST elevation myocardial infarction (NSTEMI)   Hypertension   Hyperlipidemia   Diabetes mellitus ([place])   CAD (coronary artery disease)   Hypokalemia   Ischemic cardiomyopathy   Chest pain   Syncope   Patient Profile     Mr. Sotto is a 60 y.o.male history of DM2, HTN, HL, CAD with prior NSTEMI 06/2015 where he received DES to RCA, repeat cath 07/2015 after presenting with chest pain with patent stent and coronaries. He has chronically mildly elevated troponins from last few admission. Echo 08/2015 LVEF 45-50%. Seen 11/2015 with chest pain and near syncope, mild trop elevation 0.05 to 0.06. Negative CTA that admission. Near syncope thought to be due to bradycardia and low bp's that were  medication related, his lopressor, dilt and chlorthalidone were stopped, lisinopril lowered to 10mg  and later 5mg . Low dose lopressor restarted at last outpatient visit.   Readmitted 12/30/15 for syncope. Still with chronic chest pain. Troponin 0.05>>0.05.    Subjective   No recurrent syncope since admit. Still with atypical chest pain that comes and goes.   Inpatient Medications    Scheduled Meds: . aspirin EC  81 mg Oral Daily  . atorvastatin  80 mg Oral QHS  . DULoxetine  60 mg Oral Daily  . enoxaparin (LOVENOX) injection  40 mg Subcutaneous Q24H  . insulin aspart  0-5 Units Subcutaneous QHS  . insulin aspart  0-9 Units Subcutaneous TID WC  . pantoprazole  40 mg Oral Daily  . pregabalin  100 mg Oral BID  . sodium chloride flush  3 mL Intravenous Q12H  . ticagrelor  90 mg Oral BID  . traMADol  50 mg Oral TID   Continuous Infusions:  PRN Meds: cyclobenzaprine, diazepam, oxyCODONE, topiramate   Vital Signs    Vitals:   12/30/15 1928 12/31/15 0654 12/31/15 0657 12/31/15 0700  BP: 117/83 (!) 111/92 117/89 (!) 116/95  Pulse: 67 73 80  87  Resp: 19 11    Temp: 97.7 F (36.5 C) 98.7 F (37.1 C)  98.6 F (37 C)  TempSrc:      SpO2: 97% 99%    Weight: 218 lb 9.6 oz (99.2 kg)     Height: 5\' 10"  (1.778 m)       Intake/Output Summary (Last 24 hours) at 12/31/15 0930 Last data filed at 12/31/15 0854  Gross per 24 hour  Intake              720 ml  Output              725 ml  Net               -5 ml   Filed Weights   12/30/15 1523 12/30/15 1928  Weight: 219 lb (99.3 kg) 218 lb 9.6 oz (99.2 kg)    Physical Exam   GEN: Well nourished, well developed, in no acute distress.  HEENT: Grossly normal.  Neck: Supple, no JVD, carotid bruits, or masses. Cardiac: RRR, no murmurs, rubs, or gallops. No clubbing, cyanosis, edema.  Radials/DP/PT 2+ and equal bilaterally.  Respiratory:  Respirations regular and unlabored, clear to auscultation bilaterally. GI: Soft, nontender, nondistended, BS + x 4. MS: no deformity or atrophy. Skin: warm and dry, no rash. Neuro:  Strength and sensation are intact. Psych: AAOx3.  Normal affect.  Labs  CBC  Recent Labs  12/30/15 1520 12/31/15 0512  WBC 9.0 7.0  NEUTROABS 4.4  --   HGB 12.7* 12.6*  HCT 38.2* 37.8*  MCV 81.3 81.8  PLT 225 [id number]   Basic Metabolic Panel  Recent Labs  12/30/15 1520 12/31/15 0512  NA 137 140  K 2.7* 3.5  CL 104 105  CO2 22 26  GLUCOSE 181* 112*  BUN 13 9  CREATININE 1.20 0.86  CALCIUM 9.2 8.9  MG 1.7  --    Liver Function Tests  Recent Labs  12/30/15 1520 12/31/15 0512  AST 31 24  ALT 22 20  ALKPHOS 67 60  BILITOT 0.6 0.7  PROT 6.6 6.2*  ALBUMIN 4.2 3.6   No results for input(s): LIPASE, AMYLASE in the last 72 hours. Cardiac Enzymes  Recent Labs  12/30/15 2320 12/31/15 0512  TROPONINI 0.05* 0.05*   BNP Invalid input(s): POCBNP D-Dimer No results for input(s): DDIMER in the last 72 hours. Hemoglobin A1C No results for input(s): HGBA1C in the last 72 hours. Fasting Lipid Panel No results for input(s): CHOL, HDL,  LDLCALC, TRIG, CHOLHDL, LDLDIRECT in the last 72 hours. Thyroid Function Tests  Recent Labs  12/30/15 1923  TSH 0.354    Telemetry    NSR - Personally Reviewed  ECG    NSR- Personally Reviewed  Radiology    Dg Chest 2 View  Result Date: 12/30/2015 CLINICAL DATA:  Chest pain EXAM: CHEST  2 VIEW COMPARISON:  12/03/2015 chest radiograph. FINDINGS: Spinal stimulator device overlies the mid to lower thoracic spine. Stable cardiomediastinal silhouette with normal heart size. No pneumothorax. No pleural effusion. Lungs appear clear, with no acute consolidative airspace disease and no pulmonary edema. IMPRESSION: No active cardiopulmonary disease. Electronically Signed   By: Ilona Sorrel M.D.   On: 12/30/2015 16:44    Cardiac Studies     Patient Profile     Mr. Corona is a 60 y.o.male history of DM2, HTN, HL, CAD with prior NSTEMI 06/2015 where he received DES to RCA, repeat cath 07/2015 after presenting with chest pain with patent stent and coronaries. He has chronically mildly elevated troponins from last few admission. Echo 08/2015 LVEF 45-50%. Seen 11/2015 with chest pain and near syncope, mild trop elevation 0.05 to 0.06. Negative CTA that admission. Near syncope thought to be due to bradycardia and low bp's that were  medication related, his lopressor, dilt and chlorthalidone were stopped, lisinopril lowered to 10mg  and later 5mg . Low dose lopressor restarted at last outpatient visit.   Readmitted 12/30/15 for syncope. Still with chronic chest pain. Troponin 0.05>>0.05.   Assessment & Plan    1.  Chest pain - several month history of ongoing chest pain. Previous stent 06/2015, repeat cath for ongoing chest pain 07/2015 without significant disease - history of chronically elevated troponins from last few hospital admissions - negative CTA for aortic pathology 11/2015 - EKG without acute ischemic changes. Enzymes cycled overnight, 0.05>>0.05 (chronically elevated) Recommend starting  ranexa 500mg  bid. He has had issues with low bp's in the past, would avoid aggressive titration of other antianginals - symptoms fairly atypical, would not pursue repeat ischemic testing given no significant changes in trop or EKG  2. Syncope - unclear cause - order orthostatics. I have asked RN to perform and document today.  - telemetry unremarkable. No arrhthymias. No bradycardia.  - if negative tele would consider home event monitor  3. Hypokalemia - K improved after supplementation from 2.7>>3.5 - per medicine team. Recurrent  issue for patient.   4. CAD - continue home meds - as recommended by Dr. Harl Bowie on 12/30/15, add Ranexa 500 mg BID.   5. Recurrent brief episodes of dyspnea Suspect this is related to [place]. Will switch to Plavix.   Signed, Lyda Jester, PA-C  12/31/2015, 9:30 AM   The patient has been seen in conjunction with Lyda Jester, PA-C. All aspects of care have been considered and discussed. The patient has been personally interviewed, examined, and all clinical data has been reviewed.   History reviewed. We need to exclude the possibility of ventricular tachycardia and intermittent bradycardia. Long-term monitoring is indicated. Plan to simplify medical regimen as much as possible. May need a loop recorder if appropriate data is not obtained with continuous monitoring  Persistent intermittent episodes of dyspnea. Likely related to Brilinta. Switch to Plavix.

## 2015-12-31 NOTE — Progress Notes (Signed)
PROGRESS NOTE    BRIGHAM WIDMAN  [id number] DOB: 11-Oct-1955 DOA: 12/30/2015 PCP: [name], MD   Outpatient Specialists:     Brief Narrative:  Thomas Mcclure is a 60 y.o. male with medical history significant of coronary artery disease status post recent NSTEMI in May 2017 with DES in RCA, chronic combined systolic and diastolic CHF, chronic back pain with spinal cord stimulator, diabetes mellitus, presents to the emergency room with chief complaint of 2 syncopal episodes today. He also has been describing intermittent chest pains yesterday as well as today. He was recently seen last month with near syncope thought to be due to his medications and his Lopressor was stopped, and then restarted as an outpatient at a lower dose. He states that he can usually feel when he is about to pass out and feels weak beforehand. He denies any fever or chills. He denies any palpitations. He denies any shortness of breath. He has no abdominal pain, no nausea, vomiting, or diarrhea. He also reports that over the last week he has been feeling extremely weak and tired, and barely able to walk to his mailbox and back due to significant tiredness. He feels like every time he tries to do something he is "wiped out".    Assessment & Plan:   Active Problems:   History of non-ST elevation myocardial infarction (NSTEMI)   Hypertension   Hyperlipidemia   Diabetes mellitus (HCC)   CAD (coronary artery disease)   Hypokalemia   Ischemic cardiomyopathy   Chest pain   Syncope   Chest pain with syncope -extensive coronary artery disease -cardiology consult  -telemetry -check d dimer-- ask hypoxic with ambulation  acute resp failure -de-sats to 74% while ambulating -check  D-dimer  Coronary artery disease - Cardiology following, patient reports significant decrease in functional status and significant exertional symptoms over the last week -echo  Hypertension - Patient with soft blood pressure on  admission -hold all BP meds  Diabetes mellitus - Most recent hemoglobin A1c last month was 6.8, showing good control, place patient on sliding scale insulin while hospitalized  Hypokalemia - replete and recheck in am   Chronic pain - Resume home medications, replace narcotics as PRN rather than scheduled   DVT prophylaxis:  Lovenox   Code Status: Full Code   Family Communication:   Disposition Plan:     Consultants:   cardiology     Subjective: Wants to know what's wrong-- gets SOB with exertion  Objective: Vitals:   12/30/15 1928 12/31/15 0654 12/31/15 0657 12/31/15 0700  BP: 117/83 (!) 111/92 117/89 (!) 116/95  Pulse: 67 73 80 87  Resp: 19 11    Temp: 97.7 F (36.5 C) 98.7 F (37.1 C)  98.6 F (37 C)  TempSrc:      SpO2: 97% 99%    Weight: 99.2 kg (218 lb 9.6 oz)     Height: 5\' 10"  (1.778 m)       Intake/Output Summary (Last 24 hours) at 12/31/15 1237 Last data filed at 12/31/15 0854  Gross per 24 hour  Intake              720 ml  Output              725 ml  Net               -5 ml   Filed Weights   12/30/15 1523 12/30/15 1928  Weight: 99.3 kg (219 lb) 99.2 kg (218 lb  9.6 oz)    Examination:  General exam: Appears calm and comfortable  Respiratory system: diminished, no wheezing Cardiovascular system: S1 & S2 heard, RRR. No JVD, murmurs, rubs, gallops or clicks. No pedal edema. Gastrointestinal system: Abdomen is nondistended, soft and nontender. No organomegaly or masses felt. Normal bowel sounds heard. Central nervous system: Alert and oriented. No focal neurological deficits.    Data Reviewed: I have personally reviewed following labs and imaging studies  CBC:  Recent Labs Lab 12/30/15 1520 12/31/15 0512  WBC 9.0 7.0  NEUTROABS 4.4  --   HGB 12.7* 12.6*  HCT 38.2* 37.8*  MCV 81.3 81.8  PLT 225 [id number]   Basic Metabolic Panel:  Recent Labs Lab 12/30/15 1520 12/31/15 0512  NA 137 140  K 2.7* 3.5  CL 104 105  CO2 22  26  GLUCOSE 181* 112*  BUN 13 9  CREATININE 1.20 0.86  CALCIUM 9.2 8.9  MG 1.7  --    GFR: Estimated Creatinine Clearance: 107.9 mL/min (by C-G formula based on SCr of 0.86 mg/dL). Liver Function Tests:  Recent Labs Lab 12/30/15 1520 12/31/15 0512  AST 31 24  ALT 22 20  ALKPHOS 67 60  BILITOT 0.6 0.7  PROT 6.6 6.2*  ALBUMIN 4.2 3.6   No results for input(s): LIPASE, AMYLASE in the last 168 hours. No results for input(s): AMMONIA in the last 168 hours. Coagulation Profile: No results for input(s): INR, PROTIME in the last 168 hours. Cardiac Enzymes:  Recent Labs Lab 12/30/15 2320 12/31/15 0512  TROPONINI 0.05* 0.05*   BNP (last 3 results) No results for input(s): PROBNP in the last 8760 hours. HbA1C: No results for input(s): HGBA1C in the last 72 hours. CBG:  Recent Labs Lab 12/24/15 1414 12/30/15 1732 12/30/15 1925 12/31/15 0748 12/31/15 1122  GLUCAP 126* 151* 129* 165* 95   Lipid Profile: No results for input(s): CHOL, HDL, LDLCALC, TRIG, CHOLHDL, LDLDIRECT in the last 72 hours. Thyroid Function Tests:  Recent Labs  12/30/15 1923  TSH 0.354  FREET4 0.73   Anemia Panel: No results for input(s): VITAMINB12, FOLATE, FERRITIN, TIBC, IRON, RETICCTPCT in the last 72 hours. Urine analysis:    Component Value Date/Time   COLORURINE YELLOW 12/03/2015 1805   APPEARANCEUR CLEAR 12/03/2015 1805   LABSPEC 1.009 12/03/2015 1805   PHURINE 6.0 12/03/2015 1805   GLUCOSEU NEGATIVE 12/03/2015 1805   HGBUR NEGATIVE 12/03/2015 1805   BILIRUBINUR NEGATIVE 12/03/2015 1805   KETONESUR NEGATIVE 12/03/2015 1805   PROTEINUR NEGATIVE 12/03/2015 1805   UROBILINOGEN 1.0 07/26/2012 1552   NITRITE NEGATIVE 12/03/2015 1805   LEUKOCYTESUR NEGATIVE 12/03/2015 1805     )No results found for this or any previous visit (from the past 240 hour(s)).    Anti-infectives    None       Radiology Studies: Dg Chest 2 View  Result Date: 12/30/2015 CLINICAL DATA:  Chest  pain EXAM: CHEST  2 VIEW COMPARISON:  12/03/2015 chest radiograph. FINDINGS: Spinal stimulator device overlies the mid to lower thoracic spine. Stable cardiomediastinal silhouette with normal heart size. No pneumothorax. No pleural effusion. Lungs appear clear, with no acute consolidative airspace disease and no pulmonary edema. IMPRESSION: No active cardiopulmonary disease. Electronically Signed   By: Ilona Sorrel M.D.   On: 12/30/2015 16:44        Scheduled Meds: . aspirin EC  81 mg Oral Daily  . atorvastatin  80 mg Oral QHS  . DULoxetine  60 mg Oral Daily  . enoxaparin (LOVENOX) injection  40 mg Subcutaneous Q24H  . insulin aspart  0-5 Units Subcutaneous QHS  . insulin aspart  0-9 Units Subcutaneous TID WC  . magnesium sulfate 1 - 4 g bolus IVPB  2 g Intravenous Once  . pantoprazole  40 mg Oral Daily  . pregabalin  100 mg Oral BID  . sodium chloride flush  3 mL Intravenous Q12H  . ticagrelor  90 mg Oral BID  . traMADol  50 mg Oral TID   Continuous Infusions:   LOS: 0 days    Time spent: 25 min    Sharon, DO [facility] Pager [phone]  If 7PM-7AM, please contact night-coverage [email or web address] Password [facility] 12/31/2015, 12:37 PM

## 2015-12-31 NOTE — Progress Notes (Signed)
SATURATION QUALIFICATIONS: (This note is used to comply with regulatory documentation for home oxygen)  Patient Saturations on Room Air at Rest =93%  Patient Saturations on Room Air while Ambulating = 74%  Patient Saturations on 2 Liters of oxygen while Ambulating =94%  Please briefly explain why patient needs home oxygen:

## 2015-12-31 NOTE — Progress Notes (Signed)
  Echocardiogram 2D Echocardiogram has been performed.  Donata Clay 12/31/2015, 5:38 PM

## 2016-01-01 ENCOUNTER — Encounter (HOSPITAL_COMMUNITY): Payer: Self-pay | Admitting: *Deleted

## 2016-01-01 ENCOUNTER — Other Ambulatory Visit: Payer: Self-pay | Admitting: Physician Assistant

## 2016-01-01 DIAGNOSIS — I2510 Atherosclerotic heart disease of native coronary artery without angina pectoris: Secondary | ICD-10-CM

## 2016-01-01 DIAGNOSIS — R55 Syncope and collapse: Secondary | ICD-10-CM

## 2016-01-01 DIAGNOSIS — I208 Other forms of angina pectoris: Secondary | ICD-10-CM | POA: Diagnosis not present

## 2016-01-01 DIAGNOSIS — I25118 Atherosclerotic heart disease of native coronary artery with other forms of angina pectoris: Secondary | ICD-10-CM | POA: Diagnosis not present

## 2016-01-01 DIAGNOSIS — I10 Essential (primary) hypertension: Secondary | ICD-10-CM | POA: Diagnosis not present

## 2016-01-01 DIAGNOSIS — R079 Chest pain, unspecified: Secondary | ICD-10-CM | POA: Diagnosis not present

## 2016-01-01 DIAGNOSIS — E782 Mixed hyperlipidemia: Secondary | ICD-10-CM | POA: Diagnosis not present

## 2016-01-01 LAB — ECHOCARDIOGRAM COMPLETE
Height: 70 in
Weight: 3497.6 oz

## 2016-01-01 LAB — GLUCOSE, CAPILLARY
Glucose-Capillary: 146 mg/dL — ABNORMAL HIGH (ref 65–99)
Glucose-Capillary: 81 mg/dL (ref 65–99)

## 2016-01-01 MED ORDER — CLOPIDOGREL BISULFATE 75 MG PO TABS: 75.0000 mg | ORAL_TABLET | Freq: Every day | ORAL | 0 refills | Status: DC

## 2016-01-01 NOTE — Discharge Summary (Signed)
Physician Discharge Summary  Thomas Mcclure [id number] DOB: Jan 17, 1956 DOA: 12/30/2015  PCP: [name], MD  Admit date: 12/30/2015 Discharge date: 01/01/2016   Recommendations for Outpatient Follow-Up:   1. PFTs   Discharge Diagnosis:   Active Problems:   History of non-ST elevation myocardial infarction (NSTEMI)   Hypertension   Hyperlipidemia   Diabetes mellitus (HCC)   CAD (coronary artery disease)   Hypokalemia   Ischemic cardiomyopathy   Chest pain   Syncope   Discharge disposition:  Home  Discharge Condition: Improved.  Diet recommendation: Low sodium, heart healthy.  Carbohydrate-modified  Wound care: None.   History of Present Illness:   Thomas Mcclure is a 60 y.o. male with medical history significant of coronary artery disease status post recent NSTEMI in May 2017 with DES in RCA, chronic combined systolic and diastolic CHF, chronic back pain with spinal cord stimulator, diabetes mellitus, presents to the emergency room with chief complaint of 2 syncopal episodes today. He also has been describing intermittent chest pains yesterday as well as today. He was recently seen last month with near syncope thought to be due to his medications and his Lopressor was stopped, and then restarted as an outpatient at a lower dose. He states that he can usually feel when he is about to pass out and feels weak beforehand. He denies any fever or chills. He denies any palpitations. He denies any shortness of breath. He has no abdominal pain, no nausea, vomiting, or diarrhea. He also reports that over the last week he has been feeling extremely weak and tired, and barely able to walk to his mailbox and back due to significant tiredness. He feels like every time he tries to do something he is "wiped out".    Hospital Course by Problem:   Chest pain with syncope -extensive coronary artery disease -cardiology consult  -telemetry - d dimer- normal  Coronary artery  disease - Cardiology following, patient reports significant decrease in functional status and significant exertional symptoms over the last week -echo done -symptoms improved with dc of BP meds and changing of brilenta to plavix  Hypertension - Patient with soft blood pressure on admission -d/c all BP meds -patient has cuff at home and will check BP at home  Diabetes mellitus - Most recent hemoglobin A1c last month was 6.8, showing good control  Hypokalemia - repleted  Chronic pain - Resume home medications, replace narcotics as PRN rather than scheduled     Medical Consultants:    cards   Discharge Exam:   Vitals:   12/31/15 2030 01/01/16 0500  BP: (!) 111/54 115/77  Pulse: 67 83  Resp: 14 11  Temp: 98.2 F (36.8 C) 97.6 F (36.4 C)   Vitals:   12/31/15 0700 12/31/15 1404 12/31/15 2030 01/01/16 0500  BP: (!) 116/95 104/64 (!) 111/54 115/77  Pulse: 87 75 67 83  Resp:  19 14 11   Temp: 98.6 F (37 C) 98.6 F (37 C) 98.2 F (36.8 C) 97.6 F (36.4 C)  TempSrc:  Oral    SpO2:  95% 93% 96%  Weight:    99.6 kg (219 lb 8 oz)  Height:        Gen:  NAD- anxious to go home  The results of significant diagnostics from this hospitalization (including imaging, microbiology, ancillary and laboratory) are listed below for reference.     Procedures and Diagnostic Studies:   Dg Chest 2 View  Result Date: 12/30/2015 CLINICAL DATA:  Chest pain EXAM: CHEST  2 VIEW COMPARISON:  12/03/2015 chest radiograph. FINDINGS: Spinal stimulator device overlies the mid to lower thoracic spine. Stable cardiomediastinal silhouette with normal heart size. No pneumothorax. No pleural effusion. Lungs appear clear, with no acute consolidative airspace disease and no pulmonary edema. IMPRESSION: No active cardiopulmonary disease. Electronically Signed   By: Ilona Sorrel M.D.   On: 12/30/2015 16:44     Labs:   Basic Metabolic Panel:  Recent Labs Lab 12/30/15 1520 12/31/15 0512   NA 137 140  K 2.7* 3.5  CL 104 105  CO2 22 26  GLUCOSE 181* 112*  BUN 13 9  CREATININE 1.20 0.86  CALCIUM 9.2 8.9  MG 1.7  --    GFR Estimated Creatinine Clearance: 108 mL/min (by C-G formula based on SCr of 0.86 mg/dL). Liver Function Tests:  Recent Labs Lab 12/30/15 1520 12/31/15 0512  AST 31 24  ALT 22 20  ALKPHOS 67 60  BILITOT 0.6 0.7  PROT 6.6 6.2*  ALBUMIN 4.2 3.6   No results for input(s): LIPASE, AMYLASE in the last 168 hours. No results for input(s): AMMONIA in the last 168 hours. Coagulation profile No results for input(s): INR, PROTIME in the last 168 hours.  CBC:  Recent Labs Lab 12/30/15 1520 12/31/15 0512  WBC 9.0 7.0  NEUTROABS 4.4  --   HGB 12.7* 12.6*  HCT 38.2* 37.8*  MCV 81.3 81.8  PLT 225 219   Cardiac Enzymes:  Recent Labs Lab 12/30/15 2320 12/31/15 0512  TROPONINI 0.05* 0.05*   BNP: Invalid input(s): POCBNP CBG:  Recent Labs Lab 12/31/15 0748 12/31/15 1122 12/31/15 1600 12/31/15 2205 01/01/16 0732  GLUCAP 165* 95 119* 153* 146*   D-Dimer  Recent Labs  12/31/15 1241  DDIMER 0.36   Hgb A1c No results for input(s): HGBA1C in the last 72 hours. Lipid Profile No results for input(s): CHOL, HDL, LDLCALC, TRIG, CHOLHDL, LDLDIRECT in the last 72 hours. Thyroid function studies  Recent Labs  12/30/15 1923  TSH 0.354   Anemia work up No results for input(s): VITAMINB12, FOLATE, FERRITIN, TIBC, IRON, RETICCTPCT in the last 72 hours. Microbiology No results found for this or any previous visit (from the past 240 hour(s)).   Discharge Instructions:   Discharge Instructions    Diet - low sodium heart healthy    Complete by:  As directed    Diet Carb Modified    Complete by:  As directed    Increase activity slowly    Complete by:  As directed        Medication List    STOP taking these medications   BRILINTA 90 MG Tabs tablet Generic drug:  ticagrelor   diltiazem 180 MG 24 hr capsule Commonly known  as:  DILACOR XR   furosemide 40 MG tablet Commonly known as:  LASIX   lisinopril 10 MG tablet Commonly known as:  PRINIVIL,ZESTRIL   lisinopril 20 MG tablet Commonly known as:  PRINIVIL,ZESTRIL   metoprolol tartrate 25 MG tablet Commonly known as:  LOPRESSOR   potassium chloride 10 MEQ tablet Commonly known as:  K-DUR     TAKE these medications   aspirin EC 81 MG tablet Take 1 tablet (81 mg total) by mouth daily.   atorvastatin 80 MG tablet Commonly known as:  LIPITOR TAKE 1 TABLET BY MOUTH EVERY DAY AT 6PM What changed:  See the new instructions.   clopidogrel 75 MG tablet Commonly known as:  PLAVIX Take 1 tablet (75 mg  total) by mouth daily.   cyclobenzaprine 10 MG tablet Commonly known as:  FLEXERIL Take 10 mg by mouth 3 (three) times daily as needed for muscle spasms.   diazepam 10 MG tablet Commonly known as:  VALIUM Take 10 mg by mouth 2 (two) times daily. For spasms   DULoxetine 60 MG capsule Commonly known as:  CYMBALTA Take 60 mg by mouth daily.   metFORMIN 750 MG 24 hr tablet Commonly known as:  GLUCOPHAGE-XR Take 750 mg by mouth at bedtime.   multivitamin with minerals Tabs tablet Take 1 tablet by mouth daily.   nitroGLYCERIN 0.4 MG SL tablet Commonly known as:  NITROSTAT Place 1 tablet (0.4 mg total) under the tongue every 5 (five) minutes as needed for chest pain.   omeprazole 40 MG capsule Commonly known as:  PRILOSEC Take 40 mg by mouth daily.   Oxycodone HCl 10 MG Tabs Take 10 mg by mouth every 6 (six) hours. scheduled   polyethylene glycol powder powder Commonly known as:  GLYCOLAX/MIRALAX Take 17 g by mouth daily as needed (constipation). Mix in 8 oz water, juice, soda, coffee or tea and drink   pregabalin 100 MG capsule Commonly known as:  LYRICA Take 100 mg by mouth 2 (two) times daily.   topiramate 50 MG tablet Commonly known as:  TOPAMAX Take 50 mg by mouth 2 (two) times daily as needed (nerve pain/ leg cramps).   traMADol  50 MG tablet Commonly known as:  ULTRAM Take 50 mg by mouth 3 (three) times daily. scheduled         Time coordinating discharge: 35 min  Signed:  Skylinn Vialpando U Oluwatobiloba Martin   [facility] 01/01/2016, 9:48 AM

## 2016-01-01 NOTE — Progress Notes (Signed)
Patient Name: Thomas Mcclure Date of Encounter: 01/01/2016  Primary Cardiologist: [facility] Problem List     Active Problems:   History of non-ST elevation myocardial infarction (NSTEMI)   Hypertension   Hyperlipidemia   Diabetes mellitus (HCC)   CAD (coronary artery disease)   Hypokalemia   Ischemic cardiomyopathy   Chest pain   Syncope     Subjective   Doing well. Denies angina. No syncope or near syncope. Has not yet ambulated. Requesting to go home.  Inpatient Medications    Scheduled Meds: . aspirin EC  81 mg Oral Daily  . atorvastatin  80 mg Oral QHS  . clopidogrel  75 mg Oral Daily  . DULoxetine  60 mg Oral Daily  . enoxaparin (LOVENOX) injection  40 mg Subcutaneous Q24H  . insulin aspart  0-5 Units Subcutaneous QHS  . insulin aspart  0-9 Units Subcutaneous TID WC  . pantoprazole  40 mg Oral Daily  . pregabalin  100 mg Oral BID  . sodium chloride flush  3 mL Intravenous Q12H  . traMADol  50 mg Oral TID   Continuous Infusions:  PRN Meds: cyclobenzaprine, diazepam, oxyCODONE, topiramate   Vital Signs    Vitals:   12/31/15 0700 12/31/15 1404 12/31/15 2030 01/01/16 0500  BP: (!) 116/95 104/64 (!) 111/54 115/77  Pulse: 87 75 67 83  Resp:  19 14 11   Temp: 98.6 F (37 C) 98.6 F (37 C) 98.2 F (36.8 C) 97.6 F (36.4 C)  TempSrc:  Oral    SpO2:  95% 93% 96%  Weight:    219 lb 8 oz (99.6 kg)  Height:        Intake/Output Summary (Last 24 hours) at 01/01/16 1128 Last data filed at 01/01/16 0500  Gross per 24 hour  Intake              600 ml  Output             1450 ml  Net             -850 ml   Filed Weights   12/30/15 1523 12/30/15 1928 01/01/16 0500  Weight: 219 lb (99.3 kg) 218 lb 9.6 oz (99.2 kg) 219 lb 8 oz (99.6 kg)    Physical Exam    GEN: Well nourished, well developed, in no acute distress.  HEENT: Grossly normal.  Neck: Supple, no JVD, carotid bruits, or masses. Cardiac: RRR, no murmurs, rubs, or gallops. No  clubbing, cyanosis, edema.  Radials/DP/PT 2+ and equal bilaterally.  Respiratory:  Respirations regular and unlabored, clear to auscultation bilaterally. GI: Soft, nontender, nondistended, BS + x 4. MS: no deformity or atrophy. Skin: warm and dry, no rash. Neuro:  Strength and sensation are intact. Psych: AAOx3.  Normal affect.  Labs    CBC  Recent Labs  12/30/15 1520 12/31/15 0512  WBC 9.0 7.0  NEUTROABS 4.4  --   HGB 12.7* 12.6*  HCT 38.2* 37.8*  MCV 81.3 81.8  PLT 225 [id number]   Basic Metabolic Panel  Recent Labs  12/30/15 1520 12/31/15 0512  NA 137 140  K 2.7* 3.5  CL 104 105  CO2 22 26  GLUCOSE 181* 112*  BUN 13 9  CREATININE 1.20 0.86  CALCIUM 9.2 8.9  MG 1.7  --    Liver Function Tests  Recent Labs  12/30/15 1520 12/31/15 0512  AST 31 24  ALT 22 20  ALKPHOS 67 60  BILITOT 0.6 0.7  PROT 6.6 6.2*  ALBUMIN 4.2 3.6   No results for input(s): LIPASE, AMYLASE in the last 72 hours. Cardiac Enzymes  Recent Labs  12/30/15 2320 12/31/15 0512  TROPONINI 0.05* 0.05*   BNP Invalid input(s): POCBNP D-Dimer  Recent Labs  12/31/15 1241  DDIMER 0.36   Hemoglobin A1C No results for input(s): HGBA1C in the last 72 hours. Fasting Lipid Panel No results for input(s): CHOL, HDL, LDLCALC, TRIG, CHOLHDL, LDLDIRECT in the last 72 hours. Thyroid Function Tests  Recent Labs  12/30/15 1923  TSH 0.354    Telemetry    No significant arrhythmias. Normal sinus rhythm. - Personally Reviewed  ECG    No new data - Personally Reviewed  Radiology    Dg Chest 2 View  Result Date: 12/30/2015 CLINICAL DATA:  Chest pain EXAM: CHEST  2 VIEW COMPARISON:  12/03/2015 chest radiograph. FINDINGS: Spinal stimulator device overlies the mid to lower thoracic spine. Stable cardiomediastinal silhouette with normal heart size. No pneumothorax. No pleural effusion. Lungs appear clear, with no acute consolidative airspace disease and no pulmonary edema. IMPRESSION: No active  cardiopulmonary disease. Electronically Signed   By: Ilona Sorrel M.D.   On: 12/30/2015 16:44    Cardiac Studies   Echocardiogram, third in the past 4 months is unchanged. Study Conclusions  - Left ventricle: The cavity size was normal. There was mild   concentric hypertrophy. Systolic function was normal. The   estimated ejection fraction was in the range of 55% to 60%. There   is akinesis of the basal-midinferior myocardium. Doppler   parameters are consistent with abnormal left ventricular   relaxation (grade 1 diastolic dysfunction).  Patient Profile     Mr.Evansis a 60 y.o.malehistory of DM2, HTN, HL, CAD with prior NSTEMI 06/2015 where he received DES to RCA, repeat cath 07/2015 after presenting with chest pain with patent stent and coronaries. He has chronically mildly elevated troponins from last few admission. Echo 08/2015 LVEF 45-50%. Seen 11/2015 with chest pain and near syncope, mild trop elevation 0.05 to 0.06. Negative CTA that admission. Near syncope thought to be due to bradycardia and low bp's that were medication related, his lopressor, dilt and chlorthalidone were stopped, lisinopril lowered to 10mg  and later 5mg . Low dose lopressor restarted at last outpatient visit.   Readmitted 12/30/15 for syncope. Still with chronic chest pain. Troponin 0.05>>0.05.   Assessment & Plan    1. Recent inferior myocardial infarction, with no evidence of reinfarction this admission. Dyspnea felt related to Brilinta. A switch was made to Plavix without difficulty. This medication should be continued. 2. Recurring episodes of late afternoon weakness/near syncope, etiology uncertain. He is now off of all antihypertensive therapy. My suspicion is that he is typically normotensive without medications. The added therapy for cardioprotection was likely causing hypotension. Beta blocker, diuretic therapy, etc. has been discontinued. We will plan a 30 day monitor to ensure we are not missing post  infarct ischemic arrhythmias. He is eligible for discharge today.    Sinclair Grooms, MD  01/01/2016, 11:28 AM

## 2016-01-01 NOTE — Progress Notes (Addendum)
[name] requests 30 day event monitor - I have sent a message to our [facility] office coordinator to pre-cert and arrange this, and the office will call him with further instructions. [name] would like the patient to keep the appt he previously had on 11/10 to f/u BP. Asked nurse to make patient aware as AVS has already been printed. Siniyah Evangelist PA-C

## 2016-01-01 NOTE — Progress Notes (Signed)
SATURATION QUALIFICATIONS: (This note is used to comply with regulatory documentation for home oxygen)  Patient Saturations on Room Air at Rest = 97%  Patient Saturations on Room Air while Ambulating = 92%   

## 2016-01-02 ENCOUNTER — Encounter (HOSPITAL_COMMUNITY): Payer: Medicare Other

## 2016-01-02 ENCOUNTER — Encounter (HOSPITAL_COMMUNITY)
Admission: RE | Admit: 2016-01-02 | Discharge: 2016-01-02 | Disposition: A | Discharge status: Home / Self Care | Payer: Medicare Other | Source: Ambulatory Visit | Attending: Interventional Cardiology | Admitting: Interventional Cardiology

## 2016-01-02 ENCOUNTER — Telehealth (HOSPITAL_COMMUNITY): Payer: Self-pay | Admitting: Cardiac Rehabilitation

## 2016-01-02 DIAGNOSIS — Z955 Presence of coronary angioplasty implant and graft: Secondary | ICD-10-CM | POA: Insufficient documentation

## 2016-01-02 DIAGNOSIS — I213 ST elevation (STEMI) myocardial infarction of unspecified site: Secondary | ICD-10-CM | POA: Insufficient documentation

## 2016-01-02 DIAGNOSIS — Z79899 Other long term (current) drug therapy: Secondary | ICD-10-CM | POA: Insufficient documentation

## 2016-01-02 DIAGNOSIS — Z87891 Personal history of nicotine dependence: Secondary | ICD-10-CM | POA: Insufficient documentation

## 2016-01-02 NOTE — Progress Notes (Signed)
Cardiac Individual Treatment Plan  Patient Details  Name: Thomas Mcclure MRN: [id number] Date of Birth: April 19, 1955 Referring Provider:   Flowsheet Row CARDIAC REHAB PHASE II ORIENTATION from 11/06/2015 in [place]  Referring Provider  [name]      Initial Encounter Date:  [place] PHASE II ORIENTATION from 11/06/2015 in [place]  Date  11/06/15  Referring Provider  [name]      Visit Diagnosis: NSTEMI (non-ST elevated myocardial infarction) ([place])  S/P coronary artery stent placement  Patient's Home Medications on Admission:  Current Outpatient Prescriptions:  .  aspirin EC 81 MG tablet, Take 1 tablet (81 mg total) by mouth daily., Disp: 30 tablet, Rfl: 1 .  atorvastatin (LIPITOR) 80 MG tablet, TAKE 1 TABLET BY MOUTH EVERY DAY AT 6PM (Patient taking differently: TAKE 1 TABLET BY MOUTH EVERY DAY AT BEDTIME), Disp: 30 tablet, Rfl: 10 .  clopidogrel (PLAVIX) 75 MG tablet, Take 1 tablet (75 mg total) by mouth daily., Disp: 30 tablet, Rfl: 0 .  cyclobenzaprine (FLEXERIL) 10 MG tablet, Take 10 mg by mouth 3 (three) times daily as needed for muscle spasms. , Disp: , Rfl:  .  diazepam (VALIUM) 10 MG tablet, Take 10 mg by mouth 2 (two) times daily. For spasms, Disp: , Rfl:  .  DULoxetine (CYMBALTA) 60 MG capsule, Take 60 mg by mouth daily., Disp: , Rfl:  .  metFORMIN (GLUCOPHAGE-XR) 750 MG 24 hr tablet, Take 750 mg by mouth at bedtime. , Disp: , Rfl:  .  Multiple Vitamin (MULTIVITAMIN WITH MINERALS) TABS tablet, Take 1 tablet by mouth daily., Disp: , Rfl:  .  nitroGLYCERIN (NITROSTAT) 0.4 MG SL tablet, Place 1 tablet (0.4 mg total) under the tongue every 5 (five) minutes as needed for chest pain., Disp: 25 tablet, Rfl: 6 .  omeprazole (PRILOSEC) 40 MG capsule, Take 40 mg by mouth daily., Disp: , Rfl:  .  Oxycodone HCl 10 MG TABS, Take 10 mg by mouth every 6 (six) hours. scheduled, Disp: ,  Rfl:  .  polyethylene glycol powder (GLYCOLAX/MIRALAX) powder, Take 17 g by mouth daily as needed (constipation). Mix in 8 oz water, juice, soda, coffee or tea and drink, Disp: , Rfl:  .  pregabalin (LYRICA) 100 MG capsule, Take 100 mg by mouth 2 (two) times daily. , Disp: , Rfl:  .  topiramate (TOPAMAX) 50 MG tablet, Take 50 mg by mouth 2 (two) times daily as needed (nerve pain/ leg cramps). , Disp: , Rfl:  .  traMADol (ULTRAM) 50 MG tablet, Take 50 mg by mouth 3 (three) times daily. scheduled, Disp: , Rfl:   Past Medical History: Past Medical History:  Diagnosis Date  . Arthritis    Rheumatoid  . Baker's cyst    Left calf  . CAD in native artery, 07/15/15 PCI of RCA with DES 07/16/2015   a.   NSTEMI 5/17: LHC - pLAD 20, pLCx 20, OM1 30, pRCA 100, EF 45-50%>> PCI: 2.5 x 24 mm Promus DES to RCA  //  b.   Echo 5/17: mild LVH, EF 50-55%, no RWMA, mod RVE //  c. LHC 6/17: pLAD 20, pLCx 20, OM1 50, pRCA stent ok, EF 35-45% with mod sized inf wall and basal segment aneurysm  . Chronic back pain   . Cyst (solitary) of breast    ? cyst left calf ,knee  . Diabetes mellitus    diet controlled  .  GERD (gastroesophageal reflux disease)   . Hyperlipidemia   . Hypertension    Dr. [name] [phone]  . Ischemic cardiomyopathy    a. LV-gram at time of LHC in 6/17 with EF 35-45%  //  b. Echo 7/17: EF 45-50%, inferior HK, grade 1 diastolic dysfunction, mildly dilated aortic root, moderately reduced RVSF, mild RAE  . Myocardial infarction 2017  . 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 Former Smoker  . Years: 15.00  . Types: Cigarettes  Smokeless Tobacco  . Former Systems developer  . Quit date: 04/08/2015    Comment: 5 cig a week    Labs: Recent Review Flowsheet Data    Labs for ITP Cardiac and Pulmonary Rehab Latest Ref Rng & Units 10/07/2010 12/11/2012 07/15/2015 10/04/2015 12/04/2015   Cholestrol 125 - 200 mg/dL - - 149 124(L) -   LDLCALC  <130 mg/dL - - 96 68 -   HDL >=40 mg/dL - - 37(L) 39(L) -   Trlycerides <150 mg/dL - - 82 85 -   Hemoglobin A1c 4.8 - 5.6 % - - 6.6(H) - 6.8(H)   TCO2 0 - 100 mmol/L 25 24 25  - -      Capillary Blood Glucose: Lab Results  Component Value Date   GLUCAP 81 01/01/2016   GLUCAP 146 (H) 01/01/2016   GLUCAP 153 (H) 12/31/2015   GLUCAP 119 (H) 12/31/2015   GLUCAP 95 12/31/2015     Exercise Target Goals:    Exercise Program Goal: Individual exercise prescription set with THRR, safety & activity barriers. Participant demonstrates ability to understand and report RPE using BORG scale, to self-measure pulse accurately, and to acknowledge the importance of the exercise prescription.  Exercise Prescription Goal: Starting with aerobic activity 30 plus minutes a day, 3 days per week for initial exercise prescription. Provide home exercise prescription and guidelines that participant acknowledges understanding prior to discharge.  Activity Barriers & Risk Stratification:     Activity Barriers & Cardiac Risk Stratification - 11/06/15 1512      Activity Barriers & Cardiac Risk Stratification   Activity Barriers Back Problems;Joint Problems;History of Falls;Balance Concerns;Assistive Device;Arthritis   Comments peripheral neuropathy, RA   Cardiac Risk Stratification High      6 Minute Walk:     6 Minute Walk    Row Name 11/06/15 1651         6 Minute Walk   Phase Initial     Distance 1286 feet     Walk Time 6 minutes     # of Rest Breaks 0     MPH 2.44     METS 3.12     RPE 11     VO2 Peak 10.91     Symptoms Yes (comment)     Comments C/o ,mild left hip pain     Resting HR 78 bpm     Resting BP 130/80     Max Ex. HR 85 bpm     Max Ex. BP 142/84     2 Minute Post BP 118/84        Initial Exercise Prescription:     Initial Exercise Prescription - 11/09/15 1600      Date of Initial Exercise RX and Referring Provider   Date 11/06/15   Referring Provider Daneen Schick  III     Bike   Level 1.1   Minutes 10   METs 3.08     NuStep  Level 2   Minutes 10   METs 2.5     Track   Laps 10   Minutes 10   METs 2.74     Prescription Details   Frequency (times per week) 3   Duration Progress to 30 minutes of continuous aerobic without signs/symptoms of physical distress     Intensity   THRR 40-80% of Max Heartrate 64-128   Ratings of Perceived Exertion 11-13   Perceived Dyspnea 0-4     Progression   Progression Continue to progress workloads to maintain intensity without signs/symptoms of physical distress.     Resistance Training   Training Prescription Yes   Weight 2lbs   Reps 10-12      Perform Capillary Blood Glucose checks as needed.  Exercise Prescription Changes:     Exercise Prescription Changes    Row Name 12/03/15 1600 01/01/16 1300           Exercise Review   Progression Yes Yes        Response to Exercise   Blood Pressure (Admit) 108/70 100/72      Blood Pressure (Exercise) 122/80 120/80      Blood Pressure (Exit) 114/64 117/79      Heart Rate (Admit) 87 bpm 88 bpm      Heart Rate (Exercise) 103 bpm 95 bpm      Heart Rate (Exit) 68 bpm 80 bpm      Rating of Perceived Exertion (Exercise) 11 11      Symptoms  - feeling dizzy      Comments reviewed HEP on 11/19/15 reviewed HEP on 11/19/15      Duration Progress to 30 minutes of continuous aerobic without signs/symptoms of physical distress Progress to 30 minutes of continuous aerobic without signs/symptoms of physical distress      Intensity THRR unchanged THRR unchanged        Progression   Progression Continue to progress workloads to maintain intensity without signs/symptoms of physical distress. Continue to progress workloads to maintain intensity without signs/symptoms of physical distress.      Average METs 2.7 2.3        Resistance Training   Training Prescription Yes Yes      Weight 2lbs 2lbs      Reps 10-12 10-12        Bike   Level 1.1 1.1      Minutes  10 15      METs 3.08 3.06        NuStep   Level 4 4      Minutes 10 15      METs 2.1 1.9        Track   Laps 10  -      Minutes 10  -      METs 2.74  -        Home Exercise Plan   Plans to continue exercise at [facility] on 11/19/15. See progress note Home  Reviewed HEP on 11/19/15. See progress note      Frequency Add 2 additional days to program exercise sessions. Add 2 additional days to program exercise sessions.         Exercise Comments:   Discharge Exercise Prescription (Final Exercise Prescription Changes):     Exercise Prescription Changes - 01/01/16 1300      Exercise Review   Progression Yes     Response to Exercise   Blood Pressure (Admit) 100/72   Blood Pressure (Exercise) 120/80  Blood Pressure (Exit) 117/79   Heart Rate (Admit) 88 bpm   Heart Rate (Exercise) 95 bpm   Heart Rate (Exit) 80 bpm   Rating of Perceived Exertion (Exercise) 11   Symptoms feeling dizzy   Comments reviewed HEP on 11/19/15   Duration Progress to 30 minutes of continuous aerobic without signs/symptoms of physical distress   Intensity THRR unchanged     Progression   Progression Continue to progress workloads to maintain intensity without signs/symptoms of physical distress.   Average METs 2.3     Resistance Training   Training Prescription Yes   Weight 2lbs   Reps 10-12     Bike   Level 1.1   Minutes 15   METs 3.06     NuStep   Level 4   Minutes 15   METs 1.9     Home Exercise Plan   Plans to continue exercise at Home  Reviewed HEP on 11/19/15. See progress note   Frequency Add 2 additional days to program exercise sessions.      Nutrition:  Target Goals: Understanding of nutrition guidelines, daily intake of sodium 1500mg , cholesterol 200mg , calories 30% from fat and 7% or less from saturated fats, daily to have 5 or more servings of fruits and vegetables.  Biometrics:     Pre Biometrics - 11/06/15 1500      Pre Biometrics   Height 5' 10.25"  (1.784 m)   Weight 223 lb 15.8 oz (101.6 kg)   Waist Circumference 44 inches   Hip Circumference 44 inches   Waist to Hip Ratio 1 %   BMI (Calculated) 32   Triceps Skinfold 8.5 mm   % Body Fat 28.2 %   Grip Strength 53 kg   Flexibility --  Not performed due to back issues.   Single Leg Stand 1.09 seconds       Nutrition Therapy Plan and Nutrition Goals:     Nutrition Therapy & Goals - 11/07/15 1409      Nutrition Therapy   Diet Carb Modified, Therapeutic Lifestyle Changes     Personal Nutrition Goals   Personal Goal #1 1-2 lb wt loss/week to a wt loss goal of 6-24 lb at graduation from cardiac rehab     [place], educate and counsel regarding individualized specific dietary modifications aiming towards targeted core components such as weight, hypertension, lipid management, diabetes, heart failure and other comorbidities.   Expected Outcomes Short Term Goal: Understand basic principles of dietary content, such as calories, fat, sodium, cholesterol and nutrients.;Long Term Goal: Adherence to prescribed nutrition plan.      Nutrition Discharge: Nutrition Scores:     Nutrition Assessments - 11/07/15 1409      MEDFICTS Scores   Pre Score 52      Nutrition Goals Re-Evaluation:   Psychosocial: Target Goals: Acknowledge presence or absence of depression, maximize coping skills, provide positive support system. Participant is able to verbalize types and ability to use techniques and skills needed for reducing stress and depression.  Initial Review & Psychosocial Screening:     Initial Psych Review & Screening - 11/12/15 [place]? Yes     Barriers   Psychosocial barriers to participate in program The patient should benefit from training in stress management and relaxation.;There are no identifiable barriers or psychosocial needs.     Screening Interventions   Interventions Encouraged to  exercise  Quality of Life Scores:     Quality of Life - 11/06/15 1644      Quality of Life Scores   Health/Function Pre 25.29 %   Socioeconomic Pre 28.29 %   Psych/Spiritual Pre 30 %   Family Pre 26.4 %   GLOBAL Pre 27.09 %      PHQ-9: Recent Review Flowsheet Data    Depression screen [facility] 2/9 11/12/2015   Decreased Interest 0   Down, Depressed, Hopeless 0   PHQ - 2 Score 0      Psychosocial Evaluation and Intervention:     Psychosocial Evaluation - 11/12/15 1630      Psychosocial Evaluation & Interventions   Interventions Stress management education;Relaxation education;Encouraged to exercise with the program and follow exercise prescription   Continued Psychosocial Services Needed No  no psychosocial needs identified, no inteventions necessary       Psychosocial Re-Evaluation:     Psychosocial Re-Evaluation    [place] Name 12/07/15 1123 01/01/16 1017           Psychosocial Re-Evaluation   Interventions Encouraged to attend Cardiac Rehabilitation for the exercise;Stress management education;Relaxation education Stress management education;Relaxation education;Encouraged to attend Cardiac Rehabilitation for the exercise      Comments pt does exhibit situational stress and anxiety involving interpersonal relationships. pt recent hospitalization resulted in medication adjustments to treat pt presyncopal symptoms.  pt encouraged to participate in stress management and relaxation education in addition to exercise for stress relief. pt has no psychosocial needs identified, no interventions necessary.   no psychosocial needs identified, no interventions necessary. pt recent hospitalization for syncopal symptoms. in addition to hospitalizations,  pt frequent absences due to personal and family obligations.        Continued Psychosocial Services Needed No No         Vocational Rehabilitation: Provide vocational rehab assistance to qualifying candidates.   Vocational  Rehab Evaluation & Intervention:     Vocational Rehab - 11/06/15 1511      Initial Vocational Rehab Evaluation & Intervention   Assessment shows need for Vocational Rehabilitation No      Education: Education Goals: Education classes will be provided on a weekly basis, covering required topics. Participant will state understanding/return demonstration of topics presented.  Learning Barriers/Preferences:     Learning Barriers/Preferences - 11/06/15 1514      Learning Barriers/Preferences   Learning Barriers Sight  Memory deficits   Learning Preferences Skilled Demonstration      Education Topics: Count Your Pulse:  -Group instruction provided by verbal instruction, demonstration, patient participation and written materials to support subject.  Instructors address importance of being able to find your pulse and how to count your pulse when at home without a heart monitor.  Patients get hands on experience counting their pulse with staff help and individually.   Heart Attack, Angina, and Risk Factor Modification:  -Group instruction provided by verbal instruction, video, and written materials to support subject.  Instructors address signs and symptoms of angina and heart attacks.    Also discuss risk factors for heart disease and how to make changes to improve heart health risk factors.   Functional Fitness:  -Group instruction provided by verbal instruction, demonstration, patient participation, and written materials to support subject.  Instructors address safety measures for doing things around the house.  Discuss how to get up and down off the floor, how to pick things up properly, how to safely get out of a chair without assistance, and balance training.  Meditation and Mindfulness:  -Group instruction provided by verbal instruction, patient participation, and written materials to support subject.  Instructor addresses importance of mindfulness and meditation practice to  help reduce stress and improve awareness.  Instructor also leads participants through a meditation exercise.    Stretching for Flexibility and Mobility:  -Group instruction provided by verbal instruction, patient participation, and written materials to support subject.  Instructors lead participants through series of stretches that are designed to increase flexibility thus improving mobility.  These stretches are additional exercise for major muscle groups that are typically performed during regular warm up and cool down.   Hands Only CPR Anytime:  -Group instruction provided by verbal instruction, video, patient participation and written materials to support subject.  Instructors co-teach with AHA video for hands only CPR.  Participants get hands on experience with mannequins.   Nutrition I class: Heart Healthy Eating:  -Group instruction provided by PowerPoint slides, verbal discussion, and written materials to support subject matter. The instructor gives an explanation and review of the Therapeutic Lifestyle Changes diet recommendations, which includes a discussion on lipid goals, dietary fat, sodium, fiber, plant stanol/sterol esters, sugar, and the components of a well-balanced, healthy diet.   Nutrition II class: Lifestyle Skills:  -Group instruction provided by PowerPoint slides, verbal discussion, and written materials to support subject matter. The instructor gives an explanation and review of label reading, grocery shopping for heart health, heart healthy recipe modifications, and ways to make healthier choices when eating out.   Diabetes Question & Answer:  -Group instruction provided by PowerPoint slides, verbal discussion, and written materials to support subject matter. The instructor gives an explanation and review of diabetes co-morbidities, pre- and post-prandial blood glucose goals, pre-exercise blood glucose goals, signs, symptoms, and treatment of hypoglycemia and  hyperglycemia, and foot care basics.   Diabetes Blitz:  -Group instruction provided by PowerPoint slides, verbal discussion, and written materials to support subject matter. The instructor gives an explanation and review of the physiology behind type 1 and type 2 diabetes, diabetes medications and rational behind using different medications, pre- and post-prandial blood glucose recommendations and Hemoglobin A1c goals, diabetes diet, and exercise including blood glucose guidelines for exercising safely.    Portion Distortion:  -Group instruction provided by PowerPoint slides, verbal discussion, written materials, and food models to support subject matter. The instructor gives an explanation of serving size versus portion size, changes in portions sizes over the last 20 years, and what consists of a serving from each food group.   Stress Management:  -Group instruction provided by verbal instruction, video, and written materials to support subject matter.  Instructors review role of stress in heart disease and how to cope with stress positively.     Exercising on Your Own:  -Group instruction provided by verbal instruction, power point, and written materials to support subject.  Instructors discuss benefits of exercise, components of exercise, frequency and intensity of exercise, and end points for exercise.  Also discuss use of nitroglycerin and activating EMS.  Review options of places to exercise outside of rehab.  Review guidelines for sex with heart disease.   Cardiac Drugs I:  -Group instruction provided by verbal instruction and written materials to support subject.  Instructor reviews cardiac drug classes: antiplatelets, anticoagulants, beta blockers, and statins.  Instructor discusses reasons, side effects, and lifestyle considerations for each drug class.   Cardiac Drugs II:  -Group instruction provided by verbal instruction and written materials to support subject.  Instructor  reviews  cardiac drug classes: angiotensin converting enzyme inhibitors (ACE-I), angiotensin II receptor blockers (ARBs), nitrates, and calcium channel blockers.  Instructor discusses reasons, side effects, and lifestyle considerations for each drug class.   Anatomy and Physiology of the Circulatory System:  -Group instruction provided by verbal instruction, video, and written materials to support subject.  Reviews functional anatomy of heart, how it relates to various diagnoses, and what role the heart plays in the overall system.   Knowledge Questionnaire Score:     Knowledge Questionnaire Score - 11/06/15 1643      Knowledge Questionnaire Score   Pre Score 15/24      Core Components/Risk Factors/Patient Goals at Admission:     Personal Goals and Risk Factors at Admission - 11/06/15 1511      Core Components/Risk Factors/Patient Goals on Admission    Weight Management Weight Loss;Yes   Expected Outcomes Long Term: Adherence to nutrition and physical activity/exercise program aimed toward attainment of established weight goal;Short Term: Continue to assess and modify interventions until short term weight is achieved;Weight Loss: Understanding of general recommendations for a balanced deficit meal plan, which promotes 1-2 lb weight loss per week and includes a negative energy balance of [phone] kcal/d   Sedentary Yes   Intervention Provide advice, education, support and counseling about physical activity/exercise needs.;Develop an individualized exercise prescription for aerobic and resistive training based on initial evaluation findings, risk stratification, comorbidities and participant's personal goals.   Expected Outcomes Achievement of increased cardiorespiratory fitness and enhanced flexibility, muscular endurance and strength shown through measurements of functional capacity and personal statement of participant.   Increase Strength and Stamina Yes   Intervention Provide advice,  education, support and counseling about physical activity/exercise needs.;Develop an individualized exercise prescription for aerobic and resistive training based on initial evaluation findings, risk stratification, comorbidities and participant's personal goals.   Expected Outcomes Achievement of increased cardiorespiratory fitness and enhanced flexibility, muscular endurance and strength shown through measurements of functional capacity and personal statement of participant.   Diabetes Yes   Intervention Provide education about signs/symptoms and action to take for hypo/hyperglycemia.;Provide education about proper nutrition, including hydration, and aerobic/resistive exercise prescription along with prescribed medications to achieve blood glucose in normal ranges: Fasting glucose 65-99 mg/dL   Expected Outcomes Short Term: Participant verbalizes understanding of the signs/symptoms and immediate care of hyper/hypoglycemia, proper foot care and importance of medication, aerobic/resistive exercise and nutrition plan for blood glucose control.;Long Term: Attainment of HbA1C < 7%.   Personal Goal Other Yes   Personal Goal Build strength in legs. Be able to start lawn care business. Be a motivation to others.   Intervention Provide advicce and education regarding proper aerobic and resistance training exercise to build strength and stamina to do desired work and leisure activites.   Expected Outcomes Achievement of increased leg strength and overall strength and stamina to do work and leisure activities.      Core Components/Risk Factors/Patient Goals Review:    Core Components/Risk Factors/Patient Goals at Discharge (Final Review):    ITP Comments:     ITP Comments    Row Name 11/06/15 1349           ITP Comments Dr Fransico Him Medical Director          Comments: Pt is not making expected progress toward personal goals after completing 9 sessions. Pt has frequent absences due to  personal illness in addition to family and social obligations.  Pt recently hospitalized for near  syncopal episode in  community. Unclear if pt is able to exercise on his own at home with due to fatigue and leg weakness. Pt is pending further cardiology evaluation.    Recommend continued exercise as tolerated and advised by cardiology, incorporating life style modification education including  stress management and relaxation techniques to decrease cardiac risk profile

## 2016-01-02 NOTE — Telephone Encounter (Signed)
pc to pt to discuss returning to cardiac rehab after next office visit 01/04/2016.  Left message on answering machine.

## 2016-01-03 NOTE — Progress Notes (Signed)
Cardiology Office Note   Date:  01/04/2016   ID:  Daily, [place] Aug 16, 1955, MRN [id number]  PCP:  [name], MD  Cardiologist: [name]    Chief Complaint  Patient presents with  . Loss of Consciousness    with hospitalization   . Coronary Artery Disease      History of Present Illness: Thomas Mcclure is a 60 y.o. male who presents for post hospital for syncope and chest pain.Marland Kitchen  He has a history of DM2, HTN, HL, CAD with prior NSTEMI 06/2015 where he received DES to RCA, repeat cath 07/2015 after presenting with chest pain with patent stent and coronaries. He has chronically mildly elevated troponins from last few admission. Echo 08/2015 LVEF 45-50%.  Presents after syncopal episode while at [employer]. He reports he was sitting down when episode occurred. Family states his eyes rolled back and he slumped over. Came to in just a few minutes. No seizure like activity. A family member that was a nurse said his pulse was slow.   He was continuing to have chronic chest pain. Dull pain 5/10 midchest, lasts just a few seconds. Could occur at rest or with activity. Can have some associated sob. EKG without changes,  Ranexa could be added if needed. .   Syncope -home event monitor, hypokalemia and with hix SOB Brilinta changed to Plavix.  If event monitor does not reveal anything then loop recorder.   Today his symptoms have resolved.  No chest pain and rare SOB.  He feels much better. Back to his normal.  BP is stable.  He did choir practice last pm and had no problems.  He would like to return to cardiac rehab.    Past Medical History:  Diagnosis Date  . Arthritis    Rheumatoid  . Baker's cyst    Left calf  . CAD in native artery, 07/15/15 PCI of RCA with DES 07/16/2015   a.   NSTEMI 5/17: LHC - pLAD 20, pLCx 20, OM1 30, pRCA 100, EF 45-50%>> PCI: 2.5 x 24 mm Promus DES to RCA  //  b.   Echo 5/17: mild LVH, EF 50-55%, no RWMA, mod RVE //  c. 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 Chronic back pain   . Cyst (solitary) of breast    ? cyst left calf ,knee  . Diabetes mellitus    diet controlled  . GERD (gastroesophageal reflux disease)   . Hyperlipidemia   . Hypertension    Dr. [name] [phone]  . Ischemic cardiomyopathy    a. LV-gram at time of LHC in 6/17 with EF 35-45%  //  b. Echo 7/17: EF 45-50%, inferior HK, grade 1 diastolic dysfunction, mildly dilated aortic root, moderately reduced RVSF, mild RAE  . Myocardial infarction 2017  . 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 CARDIAC CATHETERIZATION N/A 07/15/2015   Procedure: Left Heart Cath and Coronary Angiography;  Surgeon: [name], MD;  Location: [facility];  Service: Cardiovascular;  Laterality: N/A;  . CARDIAC CATHETERIZATION N/A 07/15/2015   Procedure: Coronary Stent Intervention;  Surgeon: [name], MD;  Location: [facility];  Service: Cardiovascular;  Laterality: N/A;  . CARDIAC CATHETERIZATION N/A 08/07/2015   Procedure: Left Heart Cath and Coronary Angiography;  Surgeon:  [name], MD;  Location: [facility];  Service: Cardiovascular;  Laterality: N/A;  . CORONARY STENT PLACEMENT  07/2015  . KNEE SURGERY    . LUMBAR SPINE SURGERY  03/2009  & 2012  . MASS EXCISION N/A 04/19/2012   Procedure: removal of posterior cervical lipoma;  Surgeon: [name], MD;  Location: [facility];  Service: Neurosurgery;  Laterality: N/A;  Removal of posterior cervical lipoma  . SPINAL CORD STIMULATOR INSERTION N/A 01/07/2013   Procedure:  SPINAL CORD STIMULATOR INSERTION;  Surgeon: [name], MD;  Location: [facility];  Service: Neurosurgery;  Laterality: N/A;  . WISDOM TOOTH EXTRACTION     hx of     Current Outpatient Prescriptions  Medication Sig Dispense Refill  . aspirin EC 81 MG tablet Take 1 tablet (81 mg total)  by mouth daily. 30 tablet 1  . atorvastatin (LIPITOR) 80 MG tablet Take 80 mg by mouth daily.    . clopidogrel (PLAVIX) 75 MG tablet Take 1 tablet (75 mg total) by mouth daily. 30 tablet 0  . cyclobenzaprine (FLEXERIL) 10 MG tablet Take 10 mg by mouth 3 (three) times daily as needed for muscle spasms.     . diazepam (VALIUM) 10 MG tablet Take 10 mg by mouth 2 (two) times daily. For spasms    . DULoxetine (CYMBALTA) 60 MG capsule Take 60 mg by mouth daily.    . metFORMIN (GLUCOPHAGE-XR) 750 MG 24 hr tablet Take 750 mg by mouth at bedtime.     . Multiple Vitamin (MULTIVITAMIN WITH MINERALS) TABS tablet Take 1 tablet by mouth daily.    . nitroGLYCERIN (NITROSTAT) 0.4 MG SL tablet Place 1 tablet (0.4 mg total) under the tongue every 5 (five) minutes as needed for chest pain. 25 tablet 6  . omeprazole (PRILOSEC) 40 MG capsule Take 40 mg by mouth daily.    . Oxycodone HCl 10 MG TABS Take 10 mg by mouth every 6 (six) hours. scheduled    . polyethylene glycol powder (GLYCOLAX/MIRALAX) powder Take 17 g by mouth daily as needed (constipation). Mix in 8 oz water, juice, soda, coffee or tea and drink    . pregabalin (LYRICA) 100 MG capsule Take 100 mg by mouth 2 (two) times daily.     Marland Kitchen topiramate (TOPAMAX) 50 MG tablet Take 50 mg by mouth 2 (two) times daily as needed (nerve pain/ leg cramps).     . traMADol (ULTRAM) 50 MG tablet Take 50 mg by mouth 3 (three) times daily. scheduled     No current facility-administered medications for this visit.     Allergies:   Toradol [ketorolac tromethamine]; Methylprednisolone; Prednisone; and Adhesive [tape]    Social History:  The patient  reports that he has quit smoking. His smoking use included Cigarettes. He quit after 15.00 years of use. He quit smokeless tobacco use about 8 months ago. He reports that he drinks alcohol. He reports that he does not use drugs.   Family History:  The patient's family history includes Heart disease in his mother; Prostate cancer  in his brother.    ROS:  General:no colds or fevers,  weight is up from last visit. But heavier clothes and pt is eating better. Skin:no rashes or ulcers HEENT:no blurred vision, no congestion CV:see HPI PUL:see HPI GI:no diarrhea constipation or melena, no indigestion GU:no hematuria, no dysuria MS:no joint pain, no claudication Neuro:no syncope, no lightheadedness Endo:+ diabetes he states is stable., no thyroid disease  Wt  Readings from Last 3 Encounters:  01/04/16 227 lb 12.8 oz (103.3 kg)  01/01/16 219 lb 8 oz (99.6 kg)  12/19/15 219 lb 12.8 oz (99.7 kg)     PHYSICAL EXAM: VS:  BP 136/84   Pulse 74   Ht 5\' 10"  (1.778 m)   Wt 227 lb 12.8 oz (103.3 kg)   BMI 32.69 kg/m  , BMI Body mass index is 32.69 kg/m. General:Pleasant affect, NAD Skin:Warm and dry, brisk capillary refill HEENT:normocephalic, sclera clear, mucus membranes moist Neck:supple, no JVD, no bruits  Heart:S1S2 RRR without murmur, gallup, rub or click Lungs:clear without rales, rhonchi, or wheezes [id number], non tender, + BS, do not palpate liver spleen or masses Ext:no lower ext edema, 2+ pedal pulses, 2+ radial pulses Neuro:alert and oriented, MAE, follows commands, + facial symmetry    EKG:  EKG is NOT ordered today.    Recent Labs: 12/30/2015: B Natriuretic Peptide 29.4; Magnesium 1.7; TSH 0.354 12/31/2015: ALT 20; BUN 9; Creatinine, Ser 0.86; Hemoglobin 12.6; Platelets 219; Potassium 3.5; Sodium 140    Lipid Panel    Component Value Date/Time   CHOL 124 (L) 10/04/2015 1018   TRIG 85 10/04/2015 1018   HDL 39 (L) 10/04/2015 1018   CHOLHDL 3.2 10/04/2015 1018   VLDL 17 10/04/2015 1018   LDLCALC 68 10/04/2015 1018       Other studies Reviewed: Additional studies/ records that were reviewed today include: . ECHO 12/31/15 Study Conclusions  - Left ventricle: The cavity size was normal. There was mild   concentric hypertrophy. Systolic function was normal. The   estimated ejection  fraction was in the range of 55% to 60%. There   is akinesis of the basal-midinferior myocardium. Doppler   parameters are consistent with abnormal left ventricular   relaxation (grade 1 diastolic dysfunction).  ASSESSMENT AND PLAN:  1. CAD - He is status post non-STEMI in 5/17 treated with DES to the RCA. Repeat LHC in 6/17 demonstrated patent RCA stent, mild plaque in the LAD and LCx in moderate nonobstructive disease in the OM1. EF was 35-45% with inferior wall aneurysm. Follow-up echo with inferior hypokinesis and EF 45-50%. He has continued to have recurrent episodes of chest discomfort. Recent admits for near syncope and syncope  Lisinopril decreased to 10 mg.   He continues to have chronically elevated troponin levels.  Most recent hospitalization his lisinopril, Dilt and BB were all stopped along with lasix and K+ ALSO his Brilinta changed to Plavix.  Now symptoms resolved including chest pain.  -now EF 55-60% G1DD  Pt may return to cardiac rehab.  2. Syncope pt will wear 30 day event monitor if no arrhthymias pt to see [name] and possible loop recorder.   3. Combined systolic and diastolic CHF - NYHA [id number]. no SOB, wt is increased but pt is now eating. He is wearing heavier clothes and EF is normal.  If wt increases he will call us.  4. Ischemic CM - EF 45-50% by most recent echocardiogram.  Beta blocker, ACE inhibitor.have been stopped with syncope even at lower doses.  5. HTN - Has been hypotensive no BP meds.  Stable today  6. HL - Continue statin.   7. Hypoglycemic,at times  8. Hypokalemia was 2.7 in hospital and replaced will check today.   Current medicines are reviewed with the patient today.  The patient Has no concerns regarding medicines.  The following changes have been made:  See above Labs/ tests ordered today include:see above  Disposition:  FU:  see above  Signed, Cecilie Kicks, NP  01/04/2016 10:05 AM    [address], [address] [place], [place] Phone: [phone]; Fax: [phone]

## 2016-01-04 ENCOUNTER — Encounter (HOSPITAL_COMMUNITY): Payer: Medicare Other

## 2016-01-04 ENCOUNTER — Encounter (INDEPENDENT_AMBULATORY_CARE_PROVIDER_SITE_OTHER): Payer: Self-pay

## 2016-01-04 ENCOUNTER — Ambulatory Visit (INDEPENDENT_AMBULATORY_CARE_PROVIDER_SITE_OTHER): Payer: Medicare Other | Admitting: Cardiology

## 2016-01-04 ENCOUNTER — Encounter: Payer: Self-pay | Admitting: Cardiology

## 2016-01-04 VITALS — BP 136/84 | HR 74 | Ht 70.0 in | Wt 227.8 lb

## 2016-01-04 DIAGNOSIS — R55 Syncope and collapse: Secondary | ICD-10-CM

## 2016-01-04 DIAGNOSIS — E876 Hypokalemia: Secondary | ICD-10-CM | POA: Diagnosis not present

## 2016-01-04 DIAGNOSIS — I2510 Atherosclerotic heart disease of native coronary artery without angina pectoris: Secondary | ICD-10-CM

## 2016-01-04 DIAGNOSIS — E782 Mixed hyperlipidemia: Secondary | ICD-10-CM | POA: Diagnosis not present

## 2016-01-04 DIAGNOSIS — I10 Essential (primary) hypertension: Secondary | ICD-10-CM | POA: Diagnosis not present

## 2016-01-04 DIAGNOSIS — I255 Ischemic cardiomyopathy: Secondary | ICD-10-CM

## 2016-01-04 DIAGNOSIS — R001 Bradycardia, unspecified: Secondary | ICD-10-CM

## 2016-01-04 LAB — BASIC METABOLIC PANEL
BUN: 9 mg/dL (ref 7–25)
CO2: 26 mmol/L (ref 20–31)
Calcium: 9.1 mg/dL (ref 8.6–10.3)
Chloride: 105 mmol/L (ref 98–110)
Creat: 0.77 mg/dL (ref 0.70–1.25)
Glucose, Bld: 99 mg/dL (ref 65–99)
Potassium: 3.8 mmol/L (ref 3.5–5.3)
Sodium: 141 mmol/L (ref 135–146)

## 2016-01-04 NOTE — Patient Instructions (Signed)
Medication Instructions:  Your physician recommends that you continue on your current medications as directed. Please refer to the Current Medication list given to you today.   Labwork: Your physician recommends that you have lab work today: BMET   Testing/Procedures: Your physician has recommended that you wear an event monitor. Event monitors are medical devices that record the heart's electrical activity. Doctors most often [place] these monitors to diagnose arrhythmias. Arrhythmias are problems with the speed or rhythm of the heartbeat. The monitor is a small, portable device. You can wear one while you do your normal daily activities. This is usually used to diagnose what is causing palpitations/syncope (passing out). This monitor can be worn at your visit with [name] per Cecilie Kicks, NP.    Follow-Up: Your physician recommends that you keep your scheduled  follow-up appointment with [name].    Any Other Special Instructions Will Be Listed Below (If Applicable).  Watch Salt intake!!  If you gain 5lbs in one week call our office at [phone].   Low-Sodium Eating Plan Sodium raises blood pressure and causes water to be held in the body. Getting less sodium from food will help lower your blood pressure, reduce any swelling, and protect your heart, liver, and kidneys. We get sodium by adding salt (sodium chloride) to food. Most of our sodium comes from canned, boxed, and frozen foods. Restaurant foods, fast foods, and pizza are also very high in sodium. Even if you take medicine to lower your blood pressure or to reduce fluid in your body, getting less sodium from your food is important. WHAT IS MY PLAN? Most people should limit their sodium intake to 2,300 mg a day. Your health care provider recommends that you limit your sodium intake to _2000 gm__ a day.  WHAT DO I NEED TO KNOW ABOUT THIS EATING PLAN? For the low-sodium eating plan, you will follow these general  guidelines:  Choose foods with a % Daily Value for sodium of less than 5% (as listed on the food label).   Use salt-free seasonings or herbs instead of table salt or sea salt.   Check with your health care provider or pharmacist before using salt substitutes.   Eat fresh foods.  Eat more vegetables and fruits.  Limit canned vegetables. If you do use them, rinse them well to decrease the sodium.   Limit cheese to 1 oz (28 g) per day.   Eat lower-sodium products, often labeled as "lower sodium" or "no salt added."  Avoid foods that contain monosodium glutamate (MSG). MSG is sometimes added to [place] food and some canned foods.  Check food labels (Nutrition Facts labels) on foods to learn how much sodium is in one serving.  Eat more home-cooked food and less restaurant, buffet, and fast food.  When eating at a restaurant, ask that your food be prepared with less salt, or no salt if possible.  HOW DO I READ FOOD LABELS FOR SODIUM INFORMATION? The Nutrition Facts label lists the amount of sodium in one serving of the food. If you eat more than one serving, you must multiply the listed amount of sodium by the number of servings. Food labels may also identify foods as:  Sodium free--Less than 5 mg in a serving.  Very low sodium--35 mg or less in a serving.  Low sodium--140 mg or less in a serving.  Light in sodium--50% less sodium in a serving. For example, if a food that usually has 300 mg of sodium is  changed to become light in sodium, it will have 150 mg of sodium.  Reduced sodium--25% less sodium in a serving. For example, if a food that usually has 400 mg of sodium is changed to reduced sodium, it will have 300 mg of sodium. WHAT FOODS CAN I EAT? Grains Low-sodium cereals, including oats, puffed wheat and rice, and shredded wheat cereals. Low-sodium crackers. Unsalted rice and pasta. Lower-sodium bread.  Vegetables Frozen or fresh vegetables. Low-sodium or  reduced-sodium canned vegetables. Low-sodium or reduced-sodium tomato sauce and paste. Low-sodium or reduced-sodium tomato and vegetable juices.  Fruits Fresh, frozen, and canned fruit. Fruit juice.  Meat and Other Protein Products Low-sodium canned tuna and salmon. Fresh or frozen meat, poultry, seafood, and fish. Lamb. Unsalted nuts. Dried beans, peas, and lentils without added salt. Unsalted canned beans. Homemade soups without salt. Eggs.  Dairy Milk. Soy milk. Ricotta cheese. Low-sodium or reduced-sodium cheeses. Yogurt.  Condiments Fresh and dried herbs and spices. Salt-free seasonings. Onion and garlic powders. Low-sodium varieties of mustard and ketchup. Fresh or refrigerated horseradish. Lemon juice.  Fats and Oils Reduced-sodium salad dressings. Unsalted butter.  Other Unsalted popcorn and pretzels.  The items listed above may not be a complete list of recommended foods or beverages. Contact your dietitian for more options. WHAT FOODS ARE NOT RECOMMENDED? Grains Instant hot cereals. Bread stuffing, pancake, and biscuit mixes. Croutons. Seasoned rice or pasta mixes. Noodle soup cups. Boxed or frozen macaroni and cheese. Self-rising flour. Regular salted crackers. Vegetables Regular canned vegetables. Regular canned tomato sauce and paste. Regular tomato and vegetable juices. Frozen vegetables in sauces. Salted [place] fries. Olives. Angie Fava. Relishes. Sauerkraut. Salsa. Meat and Other Protein Products Salted, canned, smoked, spiced, or pickled meats, seafood, or fish. Bacon, ham, sausage, hot dogs, corned beef, chipped beef, and packaged luncheon meats. Salt pork. Jerky. Pickled herring. Anchovies, regular canned tuna, and sardines. Salted nuts. Dairy Processed cheese and cheese spreads. Cheese curds. Blue cheese and cottage cheese. Buttermilk.  Condiments Onion and garlic salt, seasoned salt, table salt, and sea salt. Canned and packaged gravies. Worcestershire sauce.  Tartar sauce. Barbecue sauce. Teriyaki sauce. Soy sauce, including reduced sodium. Steak sauce. Fish sauce. Oyster sauce. Cocktail sauce. Horseradish that you find on the shelf. Regular ketchup and mustard. Meat flavorings and tenderizers. Bouillon cubes. Hot sauce. Tabasco sauce. Marinades. Taco seasonings. Relishes. Fats and Oils Regular salad dressings. Salted butter. Margarine. Ghee. Bacon fat.  Other Potato and tortilla chips. Corn chips and puffs. Salted popcorn and pretzels. Canned or dried soups. Pizza. Frozen entrees and pot pies.  The items listed above may not be a complete list of foods and beverages to avoid. Contact your dietitian for more information.   This information is not intended to replace advice given to you by your health care provider. Make sure you discuss any questions you have with your health care provider.   Document Released: 08/02/2001 Document Revised: 03/03/2014 Document Reviewed: 12/15/2012 Elsevier Interactive Patient Education [employer].    If you need a refill on your cardiac medications before your next appointment, please call your pharmacy.

## 2016-01-07 ENCOUNTER — Encounter (HOSPITAL_COMMUNITY): Payer: Medicare Other

## 2016-01-08 ENCOUNTER — Telehealth (HOSPITAL_COMMUNITY): Payer: Self-pay | Admitting: Cardiac Rehabilitation

## 2016-01-08 NOTE — Telephone Encounter (Signed)
-----   Message from Magda Kiel, RN sent at 01/07/2016  9:26 AM EST ----- Pt may return to rehab, we changed Brilinta to Plavix, he felt great today. Thanks laura

## 2016-01-09 ENCOUNTER — Encounter (HOSPITAL_COMMUNITY): Payer: Medicare Other

## 2016-01-11 ENCOUNTER — Encounter (HOSPITAL_COMMUNITY): Payer: Medicare Other

## 2016-01-14 ENCOUNTER — Encounter (HOSPITAL_COMMUNITY)
Admission: RE | Admit: 2016-01-14 | Discharge: 2016-01-14 | Disposition: A | Discharge status: Home / Self Care | Payer: Medicare Other | Source: Ambulatory Visit | Attending: Interventional Cardiology | Admitting: Interventional Cardiology

## 2016-01-14 DIAGNOSIS — Z955 Presence of coronary angioplasty implant and graft: Secondary | ICD-10-CM | POA: Diagnosis not present

## 2016-01-14 DIAGNOSIS — Z87891 Personal history of nicotine dependence: Secondary | ICD-10-CM | POA: Diagnosis not present

## 2016-01-14 DIAGNOSIS — I213 ST elevation (STEMI) myocardial infarction of unspecified site: Secondary | ICD-10-CM | POA: Diagnosis present

## 2016-01-14 DIAGNOSIS — I214 Non-ST elevation (NSTEMI) myocardial infarction: Secondary | ICD-10-CM

## 2016-01-14 DIAGNOSIS — Z79899 Other long term (current) drug therapy: Secondary | ICD-10-CM | POA: Diagnosis not present

## 2016-01-14 LAB — GLUCOSE, CAPILLARY
Glucose-Capillary: 113 mg/dL — ABNORMAL HIGH (ref 65–99)
Glucose-Capillary: 87 mg/dL (ref 65–99)
Glucose-Capillary: 130 mg/dL — ABNORMAL HIGH (ref 65–99)

## 2016-01-15 ENCOUNTER — Ambulatory Visit (INDEPENDENT_AMBULATORY_CARE_PROVIDER_SITE_OTHER): Payer: Medicare Other

## 2016-01-15 DIAGNOSIS — R55 Syncope and collapse: Secondary | ICD-10-CM | POA: Diagnosis not present

## 2016-01-16 ENCOUNTER — Encounter (HOSPITAL_COMMUNITY)
Admission: RE | Admit: 2016-01-16 | Discharge: 2016-01-16 | Disposition: A | Discharge status: Home / Self Care | Payer: Medicare Other | Source: Ambulatory Visit | Attending: Interventional Cardiology | Admitting: Interventional Cardiology

## 2016-01-16 DIAGNOSIS — Z955 Presence of coronary angioplasty implant and graft: Secondary | ICD-10-CM | POA: Diagnosis not present

## 2016-01-16 DIAGNOSIS — I214 Non-ST elevation (NSTEMI) myocardial infarction: Secondary | ICD-10-CM

## 2016-01-16 LAB — GLUCOSE, CAPILLARY: Glucose-Capillary: 166 mg/dL — ABNORMAL HIGH (ref 65–99)

## 2016-01-18 ENCOUNTER — Encounter (HOSPITAL_COMMUNITY): Payer: Medicare Other

## 2016-01-21 ENCOUNTER — Encounter (HOSPITAL_COMMUNITY)
Admission: RE | Admit: 2016-01-21 | Discharge: 2016-01-21 | Disposition: A | Discharge status: Home / Self Care | Payer: Medicare Other | Source: Ambulatory Visit | Attending: Interventional Cardiology | Admitting: Interventional Cardiology

## 2016-01-21 DIAGNOSIS — Z955 Presence of coronary angioplasty implant and graft: Secondary | ICD-10-CM

## 2016-01-21 DIAGNOSIS — I214 Non-ST elevation (NSTEMI) myocardial infarction: Secondary | ICD-10-CM

## 2016-01-21 LAB — GLUCOSE, CAPILLARY
Glucose-Capillary: 121 mg/dL — ABNORMAL HIGH (ref 65–99)
Glucose-Capillary: 128 mg/dL — ABNORMAL HIGH (ref 65–99)

## 2016-01-23 ENCOUNTER — Encounter (HOSPITAL_COMMUNITY): Payer: Medicare Other

## 2016-01-25 ENCOUNTER — Encounter (HOSPITAL_COMMUNITY): Payer: Medicare Other

## 2016-01-25 ENCOUNTER — Telehealth (HOSPITAL_COMMUNITY): Payer: Self-pay | Admitting: Cardiac Rehabilitation

## 2016-01-25 NOTE — Telephone Encounter (Signed)
pc to pt to advise last possible date for cardiac rehab is 01/28/2016 due to [facility] reimbursement guidelines.  Pt wife states pt unable to come to 1:15 class time.  Pt will come to 9:45 class instead.  Pt wife verbalized understanding.

## 2016-01-28 ENCOUNTER — Encounter (HOSPITAL_COMMUNITY)
Admission: RE | Admit: 2016-01-28 | Discharge: 2016-01-28 | Disposition: A | Discharge status: Home / Self Care | Payer: Medicare Other | Source: Ambulatory Visit | Attending: Interventional Cardiology | Admitting: Interventional Cardiology

## 2016-01-28 ENCOUNTER — Other Ambulatory Visit: Payer: Self-pay | Admitting: *Deleted

## 2016-01-28 ENCOUNTER — Encounter (HOSPITAL_COMMUNITY): Payer: Self-pay

## 2016-01-28 VITALS — Wt 232.4 lb

## 2016-01-28 DIAGNOSIS — Z955 Presence of coronary angioplasty implant and graft: Secondary | ICD-10-CM | POA: Diagnosis present

## 2016-01-28 DIAGNOSIS — I214 Non-ST elevation (NSTEMI) myocardial infarction: Secondary | ICD-10-CM

## 2016-01-28 DIAGNOSIS — Z87891 Personal history of nicotine dependence: Secondary | ICD-10-CM | POA: Diagnosis not present

## 2016-01-28 DIAGNOSIS — I213 ST elevation (STEMI) myocardial infarction of unspecified site: Secondary | ICD-10-CM | POA: Insufficient documentation

## 2016-01-28 DIAGNOSIS — Z79899 Other long term (current) drug therapy: Secondary | ICD-10-CM | POA: Insufficient documentation

## 2016-01-28 LAB — GLUCOSE, CAPILLARY: Glucose-Capillary: 146 mg/dL — ABNORMAL HIGH (ref 65–99)

## 2016-01-28 MED ORDER — CLOPIDOGREL BISULFATE 75 MG PO TABS: 75.0000 mg | ORAL_TABLET | Freq: Every day | ORAL | 2 refills | Status: DC

## 2016-01-28 NOTE — Progress Notes (Signed)
Cardiac Individual Treatment Plan  Patient Details  Name: Thomas Mcclure MRN: [id number] Date of Birth: 06-30-1955 Referring Provider:   Flowsheet Row CARDIAC REHAB PHASE II ORIENTATION from 11/06/2015 in [facility]  Referring Provider  [name]      Initial Encounter Date:  [place] PHASE II ORIENTATION from 11/06/2015 in [facility] [facility]  Date  11/06/15  Referring Provider  [name]      Visit Diagnosis: NSTEMI (non-ST elevated myocardial infarction) ([place])  S/P coronary artery stent placement  Patient's Home Medications on Admission:  Current Outpatient Prescriptions:  .  aspirin EC 81 MG tablet, Take 1 tablet (81 mg total) by mouth daily., Disp: 30 tablet, Rfl: 1 .  atorvastatin (LIPITOR) 80 MG tablet, Take 80 mg by mouth daily., Disp: , Rfl:  .  clopidogrel (PLAVIX) 75 MG tablet, Take 1 tablet (75 mg total) by mouth daily., Disp: 90 tablet, Rfl: 2 .  cyclobenzaprine (FLEXERIL) 10 MG tablet, Take 10 mg by mouth 3 (three) times daily as needed for muscle spasms. , Disp: , Rfl:  .  diazepam (VALIUM) 10 MG tablet, Take 10 mg by mouth 2 (two) times daily. For spasms, Disp: , Rfl:  .  DULoxetine (CYMBALTA) 60 MG capsule, Take 60 mg by mouth daily., Disp: , Rfl:  .  metFORMIN (GLUCOPHAGE-XR) 750 MG 24 hr tablet, Take 750 mg by mouth at bedtime. , Disp: , Rfl:  .  Multiple Vitamin (MULTIVITAMIN WITH MINERALS) TABS tablet, Take 1 tablet by mouth daily., Disp: , Rfl:  .  nitroGLYCERIN (NITROSTAT) 0.4 MG SL tablet, Place 1 tablet (0.4 mg total) under the tongue every 5 (five) minutes as needed for chest pain., Disp: 25 tablet, Rfl: 6 .  omeprazole (PRILOSEC) 40 MG capsule, Take 40 mg by mouth daily., Disp: , Rfl:  .  Oxycodone HCl 10 MG TABS, Take 10 mg by mouth every 6 (six) hours. scheduled, Disp: , Rfl:  .  polyethylene glycol powder (GLYCOLAX/MIRALAX) powder, Take 17 g by mouth daily as needed  (constipation). Mix in 8 oz water, juice, soda, coffee or tea and drink, Disp: , Rfl:  .  pregabalin (LYRICA) 100 MG capsule, Take 100 mg by mouth 2 (two) times daily. , Disp: , Rfl:  .  topiramate (TOPAMAX) 50 MG tablet, Take 50 mg by mouth 2 (two) times daily as needed (nerve pain/ leg cramps). , Disp: , Rfl:  .  traMADol (ULTRAM) 50 MG tablet, Take 50 mg by mouth 3 (three) times daily. scheduled, Disp: , Rfl:   Past Medical History: Past Medical History:  Diagnosis Date  . Arthritis    Rheumatoid  . Baker's cyst    Left calf  . CAD in native artery, 07/15/15 PCI of RCA with DES 07/16/2015   a.   NSTEMI 5/17: LHC - pLAD 20, pLCx 20, OM1 30, pRCA 100, EF 45-50%>> PCI: 2.5 x 24 mm Promus DES to RCA  //  b.   Echo 5/17: mild LVH, EF 50-55%, no RWMA, mod RVE //  c. LHC 6/17: pLAD 20, pLCx 20, OM1 50, pRCA stent ok, EF 35-45% with mod sized inf wall and basal segment aneurysm  . Chronic back pain   . Cyst (solitary) of breast    ? cyst left calf ,knee  . Diabetes mellitus    diet controlled  . GERD (gastroesophageal reflux disease)   . Hyperlipidemia   . Hypertension  Dr. [name] [phone]  . Ischemic cardiomyopathy    a. LV-gram at time of LHC in 6/17 with EF 35-45%  //  b. Echo 7/17: EF 45-50%, inferior HK, grade 1 diastolic dysfunction, mildly dilated aortic root, moderately reduced RVSF, mild RAE  . Myocardial infarction 2017  . 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 Former Smoker  . Years: 15.00  . Types: Cigarettes  Smokeless Tobacco  . Former Systems developer  . Quit date: 04/08/2015    Comment: 5 cig a week    Labs: Recent Review Flowsheet Data    Labs for ITP Cardiac and Pulmonary Rehab Latest Ref Rng & Units 10/07/2010 12/11/2012 07/15/2015 10/04/2015 12/04/2015   Cholestrol 125 - 200 mg/dL - - 149 124(L) -   LDLCALC <130 mg/dL - - 96 68 -   HDL >=40 mg/dL - - 37(L) 39(L) -   Trlycerides <150 mg/dL - - 82 85 -    Hemoglobin A1c 4.8 - 5.6 % - - 6.6(H) - 6.8(H)   TCO2 0 - 100 mmol/L 25 24 25  - -      Capillary Blood Glucose: Lab Results  Component Value Date   GLUCAP 146 (H) 01/28/2016   GLUCAP 128 (H) 01/21/2016   GLUCAP 121 (H) 01/21/2016   GLUCAP 166 (H) 01/16/2016   GLUCAP 130 (H) 01/14/2016     Exercise Target Goals:    Exercise Program Goal: Individual exercise prescription set with THRR, safety & activity barriers. Participant demonstrates ability to understand and report RPE using BORG scale, to self-measure pulse accurately, and to acknowledge the importance of the exercise prescription.  Exercise Prescription Goal: Starting with aerobic activity 30 plus minutes a day, 3 days per week for initial exercise prescription. Provide home exercise prescription and guidelines that participant acknowledges understanding prior to discharge.  Activity Barriers & Risk Stratification:     Activity Barriers & Cardiac Risk Stratification - 11/06/15 1512      Activity Barriers & Cardiac Risk Stratification   Activity Barriers Back Problems;Joint Problems;History of Falls;Balance Concerns;Assistive Device;Arthritis   Comments peripheral neuropathy, RA   Cardiac Risk Stratification High      6 Minute Walk:     6 Minute Walk    Row Name 11/06/15 1651         6 Minute Walk   Phase Initial     Distance 1286 feet     Walk Time 6 minutes     # of Rest Breaks 0     MPH 2.44     METS 3.12     RPE 11     VO2 Peak 10.91     Symptoms Yes (comment)     Comments C/o ,mild left hip pain     Resting HR 78 bpm     Resting BP 130/80     Max Ex. HR 85 bpm     Max Ex. BP 142/84     2 Minute Post BP 118/84        Initial Exercise Prescription:     Initial Exercise Prescription - 11/09/15 1600      Date of Initial Exercise RX and Referring Provider   Date 11/06/15   Referring Provider [name]     Bike   Level 1.1   Minutes 10   METs 3.08     NuStep   Level 2   Minutes  10   METs 2.5  Track   Laps 10   Minutes 10   METs 2.74     Prescription Details   Frequency (times per week) 3   Duration Progress to 30 minutes of continuous aerobic without signs/symptoms of physical distress     Intensity   THRR 40-80% of Max Heartrate 64-128   Ratings of Perceived Exertion 11-13   Perceived Dyspnea 0-4     Progression   Progression Continue to progress workloads to maintain intensity without signs/symptoms of physical distress.     Resistance Training   Training Prescription Yes   Weight 2lbs   Reps 10-12      Perform Capillary Blood Glucose checks as needed.  Exercise Prescription Changes:     Exercise Prescription Changes    Row Name 12/03/15 1600 01/01/16 1300           Exercise Review   Progression Yes Yes        Response to Exercise   Blood Pressure (Admit) 108/70 100/72      Blood Pressure (Exercise) 122/80 120/80      Blood Pressure (Exit) 114/64 117/79      Heart Rate (Admit) 87 bpm 88 bpm      Heart Rate (Exercise) 103 bpm 95 bpm      Heart Rate (Exit) 68 bpm 80 bpm      Rating of Perceived Exertion (Exercise) 11 11      Symptoms  - feeling dizzy      Comments reviewed HEP on 11/19/15 reviewed HEP on 11/19/15      Duration Progress to 30 minutes of continuous aerobic without signs/symptoms of physical distress Progress to 30 minutes of continuous aerobic without signs/symptoms of physical distress      Intensity THRR unchanged THRR unchanged        Progression   Progression Continue to progress workloads to maintain intensity without signs/symptoms of physical distress. Continue to progress workloads to maintain intensity without signs/symptoms of physical distress.      Average METs 2.7 2.3        Resistance Training   Training Prescription Yes Yes      Weight 2lbs 2lbs      Reps 10-12 10-12        Bike   Level 1.1 1.1      Minutes 10 15      METs 3.08 3.06        NuStep   Level 4 4      Minutes 10 15      METs  2.1 1.9        Track   Laps 10  -      Minutes 10  -      METs 2.74  -        Home Exercise Plan   Plans to continue exercise at [facility] on 11/19/15. See progress note Home  Reviewed HEP on 11/19/15. See progress note      Frequency Add 2 additional days to program exercise sessions. Add 2 additional days to program exercise sessions.         Exercise Comments:   Discharge Exercise Prescription (Final Exercise Prescription Changes):     Exercise Prescription Changes - 01/01/16 1300      Exercise Review   Progression Yes     Response to Exercise   Blood Pressure (Admit) 100/72   Blood Pressure (Exercise) 120/80   Blood Pressure (Exit) 117/79   Heart Rate (Admit) 88 bpm  Heart Rate (Exercise) 95 bpm   Heart Rate (Exit) 80 bpm   Rating of Perceived Exertion (Exercise) 11   Symptoms feeling dizzy   Comments reviewed HEP on 11/19/15   Duration Progress to 30 minutes of continuous aerobic without signs/symptoms of physical distress   Intensity THRR unchanged     Progression   Progression Continue to progress workloads to maintain intensity without signs/symptoms of physical distress.   Average METs 2.3     Resistance Training   Training Prescription Yes   Weight 2lbs   Reps 10-12     Bike   Level 1.1   Minutes 15   METs 3.06     NuStep   Level 4   Minutes 15   METs 1.9     Home Exercise Plan   Plans to continue exercise at Home  Reviewed HEP on 11/19/15. See progress note   Frequency Add 2 additional days to program exercise sessions.      Nutrition:  Target Goals: Understanding of nutrition guidelines, daily intake of sodium 1500mg , cholesterol 200mg , calories 30% from fat and 7% or less from saturated fats, daily to have 5 or more servings of fruits and vegetables.  Biometrics:     Pre Biometrics - 11/06/15 1500      Pre Biometrics   Height 5' 10.25" (1.784 m)   Weight 223 lb 15.8 oz (101.6 kg)   Waist Circumference 44 inches   Hip  Circumference 44 inches   Waist to Hip Ratio 1 %   BMI (Calculated) 32   Triceps Skinfold 8.5 mm   % Body Fat 28.2 %   Grip Strength 53 kg   Flexibility --  Not performed due to back issues.   Single Leg Stand 1.09 seconds       Nutrition Therapy Plan and Nutrition Goals:     Nutrition Therapy & Goals - 11/07/15 1409      Nutrition Therapy   Diet Carb Modified, Therapeutic Lifestyle Changes     Personal Nutrition Goals   Personal Goal #1 1-2 lb wt loss/week to a wt loss goal of 6-24 lb at graduation from cardiac rehab     [place], educate and counsel regarding individualized specific dietary modifications aiming towards targeted core components such as weight, hypertension, lipid management, diabetes, heart failure and other comorbidities.   Expected Outcomes Short Term Goal: Understand basic principles of dietary content, such as calories, fat, sodium, cholesterol and nutrients.;Long Term Goal: Adherence to prescribed nutrition plan.      Nutrition Discharge: Nutrition Scores:     Nutrition Assessments - 11/07/15 1409      MEDFICTS Scores   Pre Score 52      Nutrition Goals Re-Evaluation:   Psychosocial: Target Goals: Acknowledge presence or absence of depression, maximize coping skills, provide positive support system. Participant is able to verbalize types and ability to use techniques and skills needed for reducing stress and depression.  Initial Review & Psychosocial Screening:     Initial Psych Review & Screening - 11/12/15 [place]? Yes     Barriers   Psychosocial barriers to participate in program The patient should benefit from training in stress management and relaxation.;There are no identifiable barriers or psychosocial needs.     Screening Interventions   Interventions Encouraged to exercise      Quality of Life Scores:     Quality of Life - 11/06/15  1644       Quality of Life Scores   Health/Function Pre 25.29 %   Socioeconomic Pre 28.29 %   Psych/Spiritual Pre 30 %   Family Pre 26.4 %   GLOBAL Pre 27.09 %      PHQ-9: Recent Review Flowsheet Data    Depression screen [facility] 2/9 01/28/2016 11/12/2015   Decreased Interest 0 0   Down, Depressed, Hopeless 0 0   PHQ - 2 Score 0 0      Psychosocial Evaluation and Intervention:     Psychosocial Evaluation - 01/28/16 1727      Psychosocial Evaluation & Interventions   Comments no psychosocial needs identified, no interventions necessary    Continued Psychosocial Services Needed No     Discharge Psychosocial Assessment & Intervention   Discharge Continue support measures as needed      Psychosocial Re-Evaluation:     Psychosocial Re-Evaluation    [place] Name 12/07/15 1123 01/01/16 1017           Psychosocial Re-Evaluation   Interventions Encouraged to attend Cardiac Rehabilitation for the exercise;Stress management education;Relaxation education Stress management education;Relaxation education;Encouraged to attend Cardiac Rehabilitation for the exercise      Comments pt does exhibit situational stress and anxiety involving interpersonal relationships. pt recent hospitalization resulted in medication adjustments to treat pt presyncopal symptoms.  pt encouraged to participate in stress management and relaxation education in addition to exercise for stress relief. pt has no psychosocial needs identified, no interventions necessary.   no psychosocial needs identified, no interventions necessary. pt recent hospitalization for syncopal symptoms. in addition to hospitalizations,  pt frequent absences due to personal and family obligations.        Continued Psychosocial Services Needed No No         Vocational Rehabilitation: Provide vocational rehab assistance to qualifying candidates.   Vocational Rehab Evaluation & Intervention:     Vocational Rehab - 11/06/15 1511      Initial Vocational  Rehab Evaluation & Intervention   Assessment shows need for Vocational Rehabilitation No      Education: Education Goals: Education classes will be provided on a weekly basis, covering required topics. Participant will state understanding/return demonstration of topics presented.  Learning Barriers/Preferences:     Learning Barriers/Preferences - 11/06/15 1514      Learning Barriers/Preferences   Learning Barriers Sight  Memory deficits   Learning Preferences Skilled Demonstration      Education Topics: Count Your Pulse:  -Group instruction provided by verbal instruction, demonstration, patient participation and written materials to support subject.  Instructors address importance of being able to find your pulse and how to count your pulse when at home without a heart monitor.  Patients get hands on experience counting their pulse with staff help and individually.   Heart Attack, Angina, and Risk Factor Modification:  -Group instruction provided by verbal instruction, video, and written materials to support subject.  Instructors address signs and symptoms of angina and heart attacks.    Also discuss risk factors for heart disease and how to make changes to improve heart health risk factors.   Functional Fitness:  -Group instruction provided by verbal instruction, demonstration, patient participation, and written materials to support subject.  Instructors address safety measures for doing things around the house.  Discuss how to get up and down off the floor, how to pick things up properly, how to safely get out of a chair without assistance, and balance training.   Meditation and Mindfulness:  -  Group instruction provided by verbal instruction, patient participation, and written materials to support subject.  Instructor addresses importance of mindfulness and meditation practice to help reduce stress and improve awareness.  Instructor also leads participants through a meditation  exercise.    Stretching for Flexibility and Mobility:  -Group instruction provided by verbal instruction, patient participation, and written materials to support subject.  Instructors lead participants through series of stretches that are designed to increase flexibility thus improving mobility.  These stretches are additional exercise for major muscle groups that are typically performed during regular warm up and cool down.   Hands Only CPR Anytime:  -Group instruction provided by verbal instruction, video, patient participation and written materials to support subject.  Instructors co-teach with AHA video for hands only CPR.  Participants get hands on experience with mannequins.   Nutrition I class: Heart Healthy Eating:  -Group instruction provided by PowerPoint slides, verbal discussion, and written materials to support subject matter. The instructor gives an explanation and review of the Therapeutic Lifestyle Changes diet recommendations, which includes a discussion on lipid goals, dietary fat, sodium, fiber, plant stanol/sterol esters, sugar, and the components of a well-balanced, healthy diet.   Nutrition II class: Lifestyle Skills:  -Group instruction provided by PowerPoint slides, verbal discussion, and written materials to support subject matter. The instructor gives an explanation and review of label reading, grocery shopping for heart health, heart healthy recipe modifications, and ways to make healthier choices when eating out.   Diabetes Question & Answer:  -Group instruction provided by PowerPoint slides, verbal discussion, and written materials to support subject matter. The instructor gives an explanation and review of diabetes co-morbidities, pre- and post-prandial blood glucose goals, pre-exercise blood glucose goals, signs, symptoms, and treatment of hypoglycemia and hyperglycemia, and foot care basics.   Diabetes Blitz:  -Group instruction provided by PowerPoint slides,  verbal discussion, and written materials to support subject matter. The instructor gives an explanation and review of the physiology behind type 1 and type 2 diabetes, diabetes medications and rational behind using different medications, pre- and post-prandial blood glucose recommendations and Hemoglobin A1c goals, diabetes diet, and exercise including blood glucose guidelines for exercising safely.    Portion Distortion:  -Group instruction provided by PowerPoint slides, verbal discussion, written materials, and food models to support subject matter. The instructor gives an explanation of serving size versus portion size, changes in portions sizes over the last 20 years, and what consists of a serving from each food group.   Stress Management:  -Group instruction provided by verbal instruction, video, and written materials to support subject matter.  Instructors review role of stress in heart disease and how to cope with stress positively.     Exercising on Your Own:  -Group instruction provided by verbal instruction, power point, and written materials to support subject.  Instructors discuss benefits of exercise, components of exercise, frequency and intensity of exercise, and end points for exercise.  Also discuss use of nitroglycerin and activating EMS.  Review options of places to exercise outside of rehab.  Review guidelines for sex with heart disease.   Cardiac Drugs I:  -Group instruction provided by verbal instruction and written materials to support subject.  Instructor reviews cardiac drug classes: antiplatelets, anticoagulants, beta blockers, and statins.  Instructor discusses reasons, side effects, and lifestyle considerations for each drug class.   Cardiac Drugs II:  -Group instruction provided by verbal instruction and written materials to support subject.  Instructor reviews cardiac drug classes: angiotensin converting  enzyme inhibitors (ACE-I), angiotensin II receptor blockers  (ARBs), nitrates, and calcium channel blockers.  Instructor discusses reasons, side effects, and lifestyle considerations for each drug class.   Anatomy and Physiology of the Circulatory System:  -Group instruction provided by verbal instruction, video, and written materials to support subject.  Reviews functional anatomy of heart, how it relates to various diagnoses, and what role the heart plays in the overall system.   Knowledge Questionnaire Score:     Knowledge Questionnaire Score - 11/06/15 1643      Knowledge Questionnaire Score   Pre Score 15/24      Core Components/Risk Factors/Patient Goals at Admission:     Personal Goals and Risk Factors at Admission - 11/06/15 1511      Core Components/Risk Factors/Patient Goals on Admission    Weight Management Weight Loss;Yes   Expected Outcomes Long Term: Adherence to nutrition and physical activity/exercise program aimed toward attainment of established weight goal;Short Term: Continue to assess and modify interventions until short term weight is achieved;Weight Loss: Understanding of general recommendations for a balanced deficit meal plan, which promotes 1-2 lb weight loss per week and includes a negative energy balance of [phone] kcal/d   Sedentary Yes   Intervention Provide advice, education, support and counseling about physical activity/exercise needs.;Develop an individualized exercise prescription for aerobic and resistive training based on initial evaluation findings, risk stratification, comorbidities and participant's personal goals.   Expected Outcomes Achievement of increased cardiorespiratory fitness and enhanced flexibility, muscular endurance and strength shown through measurements of functional capacity and personal statement of participant.   Increase Strength and Stamina Yes   Intervention Provide advice, education, support and counseling about physical activity/exercise needs.;Develop an individualized exercise  prescription for aerobic and resistive training based on initial evaluation findings, risk stratification, comorbidities and participant's personal goals.   Expected Outcomes Achievement of increased cardiorespiratory fitness and enhanced flexibility, muscular endurance and strength shown through measurements of functional capacity and personal statement of participant.   Diabetes Yes   Intervention Provide education about signs/symptoms and action to take for hypo/hyperglycemia.;Provide education about proper nutrition, including hydration, and aerobic/resistive exercise prescription along with prescribed medications to achieve blood glucose in normal ranges: Fasting glucose 65-99 mg/dL   Expected Outcomes Short Term: Participant verbalizes understanding of the signs/symptoms and immediate care of hyper/hypoglycemia, proper foot care and importance of medication, aerobic/resistive exercise and nutrition plan for blood glucose control.;Long Term: Attainment of HbA1C < 7%.   Personal Goal Other Yes   Personal Goal Build strength in legs. Be able to start lawn care business. Be a motivation to others.   Intervention Provide advicce and education regarding proper aerobic and resistance training exercise to build strength and stamina to do desired work and leisure activites.   Expected Outcomes Achievement of increased leg strength and overall strength and stamina to do work and leisure activities.      Core Components/Risk Factors/Patient Goals Review:    Core Components/Risk Factors/Patient Goals at Discharge (Final Review):    ITP Comments:     ITP Comments    Row Name 11/06/15 1349           ITP Comments Dr Fransico Him Medical Director          Comments: pt discharged from cardiac rehab program due to expiration of insurance time allowance. Pt with frequent absences due to medical complications and deconditioning.  It is recommended that pt participate in outpatient physical therapy.   Request for order has been forwarded to  pt PCP, Dr. Kevan Ny.  Repeat PHQ-0.  Med list reconciled.  Pt is borderline hypertensive following most recent medication adjustments. Rehab report will be forwarded to [name].

## 2016-01-30 ENCOUNTER — Encounter (HOSPITAL_COMMUNITY): Payer: Medicare Other

## 2016-02-01 ENCOUNTER — Encounter (HOSPITAL_COMMUNITY): Payer: Medicare Other

## 2016-02-04 ENCOUNTER — Telehealth: Payer: Self-pay | Admitting: Interventional Cardiology

## 2016-02-04 NOTE — Telephone Encounter (Signed)
New message   Pt wife said that she will not be able to come to his appt and that she wants to speak to the rn for the care plan and issues before his appt

## 2016-02-05 NOTE — Telephone Encounter (Signed)
Spoke with wife and she states she has been checking pt's BP at home.  She states on two occasions his BP has been "triple digits".  Wife unsure of exact BP readings but says she has them written down at home.  Advised wife to be sure to send those readings with pt for his appt tomorrow.  Wife also wanted to know if we could check pt's cholesterol level.  Wife mentioned pt does have some leg weakness that they both feel is coming from statin medication and his issues with his back as pt does have a stimulator placed in his back.  Pt still currently wearing event monitor.  Wife denies pt having any syncopal episodes since pt was last seen.  Pt has been doing much better since he was switched to Plavix.  Advised wife I would make [name] aware of how pt is doing and request for blood work while pt is here to see him tomorrow.  Wife appreciative for assistance.

## 2016-02-06 ENCOUNTER — Encounter: Payer: Self-pay | Admitting: Interventional Cardiology

## 2016-02-06 ENCOUNTER — Ambulatory Visit (INDEPENDENT_AMBULATORY_CARE_PROVIDER_SITE_OTHER): Payer: Medicare Other | Admitting: Interventional Cardiology

## 2016-02-06 VITALS — BP 162/100 | HR 94 | Ht 69.5 in | Wt 235.0 lb

## 2016-02-06 DIAGNOSIS — I253 Aneurysm of heart: Secondary | ICD-10-CM | POA: Diagnosis not present

## 2016-02-06 DIAGNOSIS — E1159 Type 2 diabetes mellitus with other circulatory complications: Secondary | ICD-10-CM

## 2016-02-06 DIAGNOSIS — I10 Essential (primary) hypertension: Secondary | ICD-10-CM | POA: Diagnosis not present

## 2016-02-06 DIAGNOSIS — I2510 Atherosclerotic heart disease of native coronary artery without angina pectoris: Secondary | ICD-10-CM | POA: Diagnosis not present

## 2016-02-06 DIAGNOSIS — R55 Syncope and collapse: Secondary | ICD-10-CM

## 2016-02-06 DIAGNOSIS — I255 Ischemic cardiomyopathy: Secondary | ICD-10-CM | POA: Diagnosis not present

## 2016-02-06 DIAGNOSIS — E782 Mixed hyperlipidemia: Secondary | ICD-10-CM

## 2016-02-06 MED ORDER — LISINOPRIL 20 MG PO TABS: 20.0000 mg | ORAL_TABLET | Freq: Every day | ORAL | 3 refills | Status: DC

## 2016-02-06 NOTE — Progress Notes (Signed)
Cardiology Office Note    Date:  02/06/2016   ID:  Thomas Mcclure, Thomas Mcclure 09-27-1955, MRN [id number]  PCP:  [name], MD  Cardiologist: Sinclair Grooms, MD   Chief Complaint  Patient presents with  . Coronary Artery Disease  . Follow-up    Blood pressure    History of Present Illness:  Thomas Mcclure is a 60 y.o. male for CAD with prior NSTEMI 06/2015 where he received DES to RCA, repeat cath 07/2015 after presenting with chest pain with patent stent and coronaries. He has chronically mildly elevated troponins from last few admission. Echo 08/2015 LVEF 45-50%. Recently admitted with recurrent syncope. Medications were adjusted downward because of relative hypotension.  Since discharge from the hospital and lessening his medication burden, Thomas Mcclure is done well. He denies chest discomfort. He has not had syncope. Now there is concern about his blood pressure, diastolic being greater than 100 mmHg. He denies angina. Shortness of breath has improved off Brilinta. He has a 30 day monitor.  He has chronic back pain. This has not changed significantly.  Past Medical History:  Diagnosis Date  . Arthritis    Rheumatoid  . Baker's cyst    Left calf  . CAD in native artery, 07/15/15 PCI of RCA with DES 07/16/2015   a.   NSTEMI 5/17: LHC - pLAD 20, pLCx 20, OM1 30, pRCA 100, EF 45-50%>> PCI: 2.5 x 24 mm Promus DES to RCA  //  b.   Echo 5/17: mild LVH, EF 50-55%, no RWMA, mod RVE //  c. LHC 6/17: pLAD 20, pLCx 20, OM1 50, pRCA stent ok, EF 35-45% with mod sized inf wall and basal segment aneurysm  . Chronic back pain   . Cyst (solitary) of breast    ? cyst left calf ,knee  . Diabetes mellitus    diet controlled  . GERD (gastroesophageal reflux disease)   . Hyperlipidemia   . Hypertension    Dr. [name] [phone]  . Ischemic cardiomyopathy    a. LV-gram at time of LHC in 6/17 with EF 35-45%  //  b. Echo 7/17: EF 45-50%, inferior HK, grade 1 diastolic dysfunction, mildly dilated  aortic root, moderately reduced RVSF, mild RAE  . Myocardial infarction 2017  . 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 CARDIAC CATHETERIZATION N/A 07/15/2015   Procedure: Left Heart Cath and Coronary Angiography;  Surgeon: [name], MD;  Location: [facility];  Service: Cardiovascular;  Laterality: N/A;  . CARDIAC CATHETERIZATION N/A 07/15/2015   Procedure: Coronary Stent Intervention;  Surgeon: [name], MD;  Location: [facility];  Service: Cardiovascular;  Laterality: N/A;  . CARDIAC CATHETERIZATION N/A 08/07/2015   Procedure: Left Heart Cath and Coronary Angiography;  Surgeon: [name], MD;  Location: [facility];  Service: Cardiovascular;  Laterality: N/A;  . CORONARY STENT PLACEMENT  07/2015  . KNEE SURGERY    . LUMBAR SPINE SURGERY  03/2009  & 2012  . MASS EXCISION N/A 04/19/2012   Procedure: removal of posterior cervical lipoma;  Surgeon: [name], MD;  Location: [facility];  Service: Neurosurgery;  Laterality: N/A;  Removal of posterior cervical lipoma  . SPINAL CORD STIMULATOR INSERTION N/A 01/07/2013   Procedure:  SPINAL CORD STIMULATOR INSERTION;  Surgeon: [name], MD;  Location: [facility];  Service: Neurosurgery;  Laterality: N/A;  . WISDOM TOOTH EXTRACTION     hx of    Current Medications: Outpatient Medications Prior to Visit  Medication Sig Dispense Refill  . aspirin EC 81 MG tablet Take 1 tablet (81 mg total) by mouth daily. 30 tablet 1  . atorvastatin (LIPITOR) 80 MG tablet Take 80 mg by mouth daily.    . clopidogrel (PLAVIX) 75 MG tablet Take 1 tablet (75 mg total) by mouth daily. 90 tablet 2  . cyclobenzaprine (FLEXERIL) 10 MG tablet Take 10 mg by mouth 3 (three) times daily as needed for muscle spasms.     . diazepam (VALIUM) 10 MG tablet Take 10 mg by mouth 2 (two) times daily. For spasms    . DULoxetine (CYMBALTA) 60 MG capsule Take 60 mg by  mouth daily.    . metFORMIN (GLUCOPHAGE-XR) 750 MG 24 hr tablet Take 750 mg by mouth at bedtime.     . Multiple Vitamin (MULTIVITAMIN WITH MINERALS) TABS tablet Take 1 tablet by mouth daily.    . nitroGLYCERIN (NITROSTAT) 0.4 MG SL tablet Place 1 tablet (0.4 mg total) under the tongue every 5 (five) minutes as needed for chest pain. 25 tablet 6  . omeprazole (PRILOSEC) 40 MG capsule Take 40 mg by mouth daily.    . Oxycodone HCl 10 MG TABS Take 10 mg by mouth every 6 (six) hours. scheduled    . polyethylene glycol powder (GLYCOLAX/MIRALAX) powder Take 17 g by mouth daily as needed (constipation). Mix in 8 oz water, juice, soda, coffee or tea and drink    . pregabalin (LYRICA) 100 MG capsule Take 100 mg by mouth 2 (two) times daily.     Marland Kitchen topiramate (TOPAMAX) 50 MG tablet Take 50 mg by mouth 2 (two) times daily as needed (nerve pain/ leg cramps).     . traMADol (ULTRAM) 50 MG tablet Take 50 mg by mouth 3 (three) times daily. scheduled     No facility-administered medications prior to visit.      Allergies:   Toradol [ketorolac tromethamine]; Brilinta [ticagrelor]; Methylprednisolone; Prednisone; and Adhesive [tape]   Social History   Social History  . Marital status: Married    Spouse name: N/A  . Number of children: 5  . Years of education: N/A   Occupational History  . 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 Smoking status: Former Smoker    Years: 15.00    Types: Cigarettes  . Smokeless tobacco: Former Systems developer    Quit date: 04/08/2015     Comment: 5 cig a week  . Alcohol use 0.0 oz/week     Comment: Couple 40 ounces daily until 2012  . Drug use: No  . Sexual activity: Not Asked   Other Topics Concern  . None   Social History Narrative  . None     Family History:  The patient's family history includes Heart disease in his mother; Prostate cancer in his brother.   ROS:   Please see the history of present illness.      Back pain, muscle pain, difficulty with balance.  All other systems reviewed and are negative.   PHYSICAL EXAM:   VS:  BP (!) 162/100 (BP Location: Left Arm)   Pulse 94   Ht 5' 9.5" (1.765 m)   Wt 235 lb (106.6 kg)   BMI 34.21 kg/m    GEN: Well nourished, well developed, in no acute distress  HEENT:  normal  Neck: no JVD, carotid bruits, or masses Cardiac: RRR; no murmurs, rubs, or gallops,no edema  Respiratory:  clear to auscultation bilaterally, normal work of breathing GI: soft, nontender, nondistended, + BS MS: no deformity or atrophy  Skin: warm and dry, no rash Neuro:  Alert and Oriented x 3, Strength and sensation are intact Psych: euthymic mood, full affect  Wt Readings from Last 3 Encounters:  02/06/16 235 lb (106.6 kg)  02/04/16 232 lb 5.8 oz (105.4 kg)  01/04/16 227 lb 12.8 oz (103.3 kg)      Studies/Labs Reviewed:   EKG:  EKG  Not repeated  Recent Labs: 12/30/2015: B Natriuretic Peptide 29.4; Magnesium 1.7; TSH 0.354 12/31/2015: ALT 20; Hemoglobin 12.6; Platelets 219 01/04/2016: BUN 9; Creat 0.77; Potassium 3.8; Sodium 141   Lipid Panel    Component Value Date/Time   CHOL 124 (L) 10/04/2015 1018   TRIG 85 10/04/2015 1018   HDL 39 (L) 10/04/2015 1018   CHOLHDL 3.2 10/04/2015 1018   VLDL 17 10/04/2015 1018   LDLCALC 68 10/04/2015 1018    Additional studies/ records that were reviewed today include:  No new data    ASSESSMENT:    1. Coronary artery disease involving native coronary artery of native heart without angina pectoris   2. Essential hypertension   3. Left ventricular aneurysm   4. Near syncope   5. Ischemic cardiomyopathy   6. Type 2 diabetes mellitus with other circulatory complication, without long-term current use of insulin ([place])   7. Mixed hyperlipidemia      PLAN:  In order of problems listed above:  1. He is stable without recurrent angina status post non-STEMI treated with stenting. 2. Significant increase in blood  pressure. We discussed low-salt diet. We will resume lisinopril 20 mg daily and repeat a basic metabolic panel and 0000000 days. 3. Not addressed. Resuming lisinopril will be theoretically helpful. 4. Complete 30 day monitor to exclude malignant ventricular arrhythmias as a cause of recent syncopal episode. 5. No evidence of volume overload. Continue current medical regimen which will now include lisinopril to prevent progressive LV enlargement. 6. Not addressed 7. Continue statin therapy.  Six-month follow-up.  Medication Adjustments/Labs and Tests Ordered: Current medicines are reviewed at length with the patient today.  Concerns regarding medicines are outlined above.  Medication changes, Labs and Tests ordered today are listed in the Patient Instructions below. Patient Instructions  Medication Instructions:  1) START Lisinopril 20mg  once daily  Labwork: Your physician recommends that you return for lab work in: 7-10 days (BMET)   Testing/Procedures: None  Follow-Up: Your physician recommends that you schedule a follow-up appointment in: 3-4 months with PA or NP.  Your physician wants you to follow-up in: 9-12 months with [name].  You will receive a reminder letter in the mail two months in advance. If you don't receive a letter, please call our office to schedule the follow-up appointment.    Any Other Special Instructions Will Be Listed Below (If Applicable).     If you need a refill on your cardiac medications before your next appointment, please call your pharmacy.      Signed, Sinclair Grooms, MD  02/06/2016 10:05 AM    [address], [place], [place]  [address] Phone: [phone]; Fax: [phone]

## 2016-02-06 NOTE — Patient Instructions (Signed)
Medication Instructions:  1) START Lisinopril 20mg  once daily  Labwork: Your physician recommends that you return for lab work in: 7-10 days (BMET)   Testing/Procedures: None  Follow-Up: Your physician recommends that you schedule a follow-up appointment in: 3-4 months with PA or NP.  Your physician wants you to follow-up in: 9-12 months with [name].  You will receive a reminder letter in the mail two months in advance. If you don't receive a letter, please call our office to schedule the follow-up appointment.    Any Other Special Instructions Will Be Listed Below (If Applicable).     If you need a refill on your cardiac medications before your next appointment, please call your pharmacy.

## 2016-02-13 ENCOUNTER — Encounter: Payer: Self-pay | Admitting: *Deleted

## 2016-02-13 ENCOUNTER — Ambulatory Visit (INDEPENDENT_AMBULATORY_CARE_PROVIDER_SITE_OTHER): Payer: Medicare Other | Admitting: *Deleted

## 2016-02-13 ENCOUNTER — Other Ambulatory Visit: Payer: Medicare Other | Admitting: *Deleted

## 2016-02-13 VITALS — BP 134/95 | HR 81 | Ht 69.5 in | Wt 235.0 lb

## 2016-02-13 DIAGNOSIS — I10 Essential (primary) hypertension: Secondary | ICD-10-CM

## 2016-02-13 LAB — BASIC METABOLIC PANEL
BUN: 13 mg/dL (ref 7–25)
CO2: 28 mmol/L (ref 20–31)
Calcium: 9.2 mg/dL (ref 8.6–10.3)
Chloride: 103 mmol/L (ref 98–110)
Creat: 0.89 mg/dL (ref 0.70–1.25)
Glucose, Bld: 87 mg/dL (ref 65–99)
Potassium: 4.1 mmol/L (ref 3.5–5.3)
Sodium: 139 mmol/L (ref 135–146)

## 2016-02-13 NOTE — Progress Notes (Signed)
Patient came to the office today for lab work and requested to have his BP checked due to having a spell of feeling hot and elevated BP 172/107 early this morning. No c/o chest pain, dizziness or sob. Patient confirmed that his cuff is only 6-7 months old and has been checked for accuracy. Patient was started on lisinopril 20 mg at his last office visit on 02/06/16. Patient confirmed that he has taking all doses of medications without any side effects. Medications reconciled during visit. Patient brought a list of his home BP readings that have been taking sporadically throughout the day. Patient advised to check his BP at the same time everyday preferably 2 hours after bp medications using the same arm and same cuff each time. Patient confirmed that he did not check his BS during his "hot & clammy spell". Patient encouraged to check his BS also when he is having the spells. Patients BP today is 134/95 & HR 81. Patient informed that his BP has improved since starting lisinopril and to contact our office if his home BP readings are elevated after taking his BP with consistent measurements. Patient verbalized understanding of plan. Message routed to [name] for his review.

## 2016-02-13 NOTE — Patient Instructions (Signed)
   Your physician has requested that you regularly monitor and record your blood pressure readings at home. Please use the same machine at the same time of day to check your readings and record them to bring to your follow-up visit.  Please check your blood sugar along with your blood pressure when you have "hot spells."

## 2016-02-14 ENCOUNTER — Telehealth: Payer: Self-pay | Admitting: Interventional Cardiology

## 2016-02-14 ENCOUNTER — Encounter: Payer: Self-pay | Admitting: Interventional Cardiology

## 2016-02-14 NOTE — Telephone Encounter (Signed)
Follow Up ° ° ° ° °Pt is returning call from earlier. Please call. °

## 2016-02-14 NOTE — Telephone Encounter (Signed)
Spoke with pt's wife and reviewed lab results.  DPR on file.  Wife asked about pt's BP.  Advised wife that pt was seen by one of our nurses yesterday while he was in the office.  Wife states pt did not tell her what the nurse's instructions were.  Advised wife pt's BP needs to be taken daily, approximately 2 hrs after he takes his meds and to take it in the same arm each time.  Also advised if he has another "spell" like he did a few nights ago with clamminess and diaphoresis, to be sure to check his blood sugar as well.  I will send BP readings that were brought in by pt yesterday to [name] for review and advisement.   12/18 @ 7:30AM 150/102 12/18 @ Noon 133/101 12/18 @ night 133/91 12/19 @ 6:30A 143/102 12/19 @ Noon 155/95 12/20 @ 5:45A 172/107 12/20 @ 7:00A 154/99  Wife states BP this morning @ 6:30A was 165/109.  Denies any symptoms.  Pt takes his Lisinopril at 7PM.

## 2016-02-14 NOTE — Telephone Encounter (Signed)
Left message to call back  

## 2016-02-14 NOTE — Telephone Encounter (Signed)
Follow Up:; ° ° °Returning your call. °

## 2016-02-16 NOTE — Telephone Encounter (Signed)
Add HCTZ 12.5 mg daily. Can change to Lisinopril HCTZ 20/12.5 mg daily when convenient.

## 2016-02-19 MED ORDER — HYDROCHLOROTHIAZIDE 12.5 MG PO TABS: 12.5000 mg | ORAL_TABLET | Freq: Every day | ORAL | 3 refills | Status: DC

## 2016-02-19 NOTE — Telephone Encounter (Signed)
Called patient with Dr. Thompson Caul recommendations. Patient is going to try HCTZ 12.5 mg and will see how that works. If working, per [name], can change to lisinopril HCTZ 20/12.5 mg daily. Patient will call with any other questions or concerns.

## 2016-02-20 ENCOUNTER — Other Ambulatory Visit: Payer: Self-pay

## 2016-02-20 MED ORDER — NITROGLYCERIN 0.4 MG SL SUBL: 0.4000 mg | SUBLINGUAL_TABLET | SUBLINGUAL | 6 refills | Status: DC | PRN

## 2016-02-21 ENCOUNTER — Telehealth: Payer: Self-pay | Admitting: Interventional Cardiology

## 2016-02-21 ENCOUNTER — Other Ambulatory Visit: Payer: Self-pay | Admitting: Physician Assistant

## 2016-02-21 NOTE — Telephone Encounter (Signed)
New Message:   When does the pt need to come in for lab work?

## 2016-02-21 NOTE — Telephone Encounter (Signed)
Spoke with wife and advised her that we normally like to check labs 7-10 days after starting a new diuretic.  Wife states pt has PCP appt on 03/03/16 and would like to see if they will draw it there.  Advised wife to call me if they are unable to draw blood that day so that we can get pt scheduled to come into our office for labs.   Wife appreciative for assistance.

## 2016-02-26 ENCOUNTER — Telehealth: Payer: Self-pay | Admitting: Interventional Cardiology

## 2016-02-26 NOTE — Telephone Encounter (Signed)
Spoke with wife and she said she has been checking pt's blood sugar and it was 114-118.  She wanted to know if these were acceptable.  Advised wife that [name] is the one that controls pt's diabetes and she would need to consult with their office on what range they preferred pt to be in.  Wife states that pt is seeing [name] on Monday.  Advised wife to record blood sugars and take these recordings to appt on Monday and have them review them.  Wife appreciative for assistance.

## 2016-02-26 NOTE — Telephone Encounter (Signed)
Follow Up:; ° ° °Returning your call. °

## 2016-03-04 NOTE — Addendum Note (Signed)
Encounter addended by: Meighan Treto D Amry Cathy on: 03/04/2016  9:40 AM<BR>    Actions taken: Flowsheet data copied forward, Visit Navigator Flowsheet section accepted

## 2016-03-07 ENCOUNTER — Other Ambulatory Visit: Payer: Self-pay | Admitting: Interventional Cardiology

## 2016-03-07 ENCOUNTER — Telehealth: Payer: Self-pay | Admitting: Interventional Cardiology

## 2016-03-07 DIAGNOSIS — R358 Other polyuria: Secondary | ICD-10-CM

## 2016-03-07 DIAGNOSIS — R3589 Other polyuria: Secondary | ICD-10-CM

## 2016-03-07 NOTE — Telephone Encounter (Signed)
Follow up    Patient calling back stating then nurse called today.

## 2016-03-07 NOTE — Progress Notes (Unsigned)
Patient called, said that the bmet was not drawn at other dr's appt. Said that he wants [place] to draw it.  I put in lab and called to schedule.  LM for patient to call back and schedule day of draw.

## 2016-03-07 NOTE — Telephone Encounter (Signed)
See lab order and message left for patient to call and schedule day for lab draw.

## 2016-03-07 NOTE — Telephone Encounter (Signed)
Pt went to see PCP and they didn't draw labs. Was told by Anderson Malta to call if they didn't so we could put in order and cheduled have it drawn here-needs Potassium checked-pls call  [phone]

## 2016-03-07 NOTE — Telephone Encounter (Signed)
Returned call to patient's wife.She stated husband will have bmet done 03/11/16.Advised I will put him on lab schedule for 03/11/16.

## 2016-03-11 ENCOUNTER — Other Ambulatory Visit: Payer: Medicare Other | Admitting: *Deleted

## 2016-03-11 DIAGNOSIS — R358 Other polyuria: Secondary | ICD-10-CM

## 2016-03-11 DIAGNOSIS — R3589 Other polyuria: Secondary | ICD-10-CM

## 2016-03-12 LAB — BASIC METABOLIC PANEL
BUN/Creatinine Ratio: 13 (ref 10–24)
BUN: 13 mg/dL (ref 8–27)
CO2: 27 mmol/L (ref 18–29)
Calcium: 9.4 mg/dL (ref 8.6–10.2)
Chloride: 95 mmol/L — ABNORMAL LOW (ref 96–106)
Creatinine, Ser: 1.03 mg/dL (ref 0.76–1.27)
GFR calc Af Amer: 91 mL/min/{1.73_m2} (ref >59)
GFR calc non Af Amer: 79 mL/min/{1.73_m2} (ref >59)
Glucose: 171 mg/dL — ABNORMAL HIGH (ref 65–99)
Potassium: 3.6 mmol/L (ref 3.5–5.2)
Sodium: 140 mmol/L (ref 134–144)

## 2016-03-19 ENCOUNTER — Telehealth: Payer: Self-pay | Admitting: Interventional Cardiology

## 2016-03-19 NOTE — Telephone Encounter (Signed)
BP readings dropped off by pt. HCTZ 12.5mg  QD was added on 02/18/17. Also takes lisinopril 20mg  QD.   02/20/16- 7:30A 168/107, took meds and 2 hrs later 156/100, 9pm 135/90 02/21/16- 2 hrs after meds 150/96, 8pm 145/88 02/22/16- 2 hrs after meds 148/97, 7pm- 146/96 02/25/16- 2 hrs after meds 137/91 02/28/16- 7A 139/89, 9A 127/84 03/01/16- 2P 135/89 03/04/16- 7A 145/92, 12P 124/89 03/07/16- 10A 148/87, 2P 145/96, 7P 128/84 03/08/16- 7A 141/91, 9A 147/100, 12P 119/87, 6P 127/88 03/10/16- 9A 123/84, 11A 126/85, 2P 106/77, 7P 124/82  Will forward to [name] for review and advisement.

## 2016-03-21 NOTE — Telephone Encounter (Signed)
**Note De-Identified Andrienne Havener Obfuscation** The pt is advised. He verbalized and he is in agreement with plan.  He wants Dr [name] to be aware that he is feeling better his BP is improved and was 124/79 this am. He states that his wife writes down all of his BP readings and they will bring list of his BP readings with them to his next OV.  Will forward to Dr [name] as Juluis Rainier.

## 2016-03-21 NOTE — Telephone Encounter (Signed)
I'm happy with her blood pressures where they are now. Keep medical regimen as is. Continued to monitor the blood pressure at home but can be twice or 3 times per week. We will make further recommendations at the next office visit. I would be concerned if we are consistently running above 150/90 mmHg.

## 2016-04-10 ENCOUNTER — Telehealth: Payer: Self-pay | Admitting: Interventional Cardiology

## 2016-04-10 NOTE — Telephone Encounter (Signed)
Advised wife that we just checked pt's BMET in Jan and Dec so we didn't need to check again at this time.  Wife verbalized understanding and was in agreement with this plan.

## 2016-04-10 NOTE — Telephone Encounter (Signed)
New message      Pt c/o medication issue:  1. Name of Medication: lisinopril and HCTZ 2. How are you currently taking this medication (dosage and times per day)?  3. Are you having a reaction (difficulty breathing--STAT)?  no 4. What is your medication issue?  Wife is concerned that pt is taking blood pressure medication and a fluid pill and has not had any labs drawn.  Please call

## 2016-04-11 ENCOUNTER — Telehealth: Payer: Self-pay | Admitting: Interventional Cardiology

## 2016-04-11 NOTE — Telephone Encounter (Signed)
  Patient has active refills as below. I have contacted [facility] and made them aware of this. They will call the patient to see if he would like for the refill to be transferred or if he would just like to pick it up at [facility].   Medication Detail    Disp Refills Start End   nitroGLYCERIN (NITROSTAT) 0.4 MG SL tablet 25 tablet 6 02/20/2016    Sig - Route: Place 1 tablet (0.4 mg total) under the tongue every 5 (five) minutes as needed for chest pain. - Sublingual   E-Prescribing Status: Receipt confirmed by pharmacy (02/20/2016 3:11 PM EST)   Pharmacy   [id number] - [place], [place] AT [address]

## 2016-04-11 NOTE — Telephone Encounter (Signed)
°  New Prob   *STAT* If patient is at the pharmacy, call can be transferred to refill team.   1. Which medications need to be refilled? (please list name of each medication and dose if known)  Nitroglycerin 0.4 mg SL  2. Which pharmacy/location (including street and city if local pharmacy) is medication to be sent to? [facility] [address] [place] [id number] [place] [address]  3. Do they need a 30 day or 90 day supply? 30 days

## 2016-04-15 ENCOUNTER — Other Ambulatory Visit: Payer: Self-pay | Admitting: Neurosurgery

## 2016-04-15 DIAGNOSIS — M5416 Radiculopathy, lumbar region: Secondary | ICD-10-CM

## 2016-04-16 ENCOUNTER — Telehealth: Payer: Self-pay | Admitting: Interventional Cardiology

## 2016-04-16 NOTE — Telephone Encounter (Signed)
Request for surgical clearance:  1. What type of surgery is being performed? Lumbar Myelogram   2. When is this surgery scheduled?  Pending   3. Are there any medications that need to be held prior to surgery and how long? Plavix for 5 days   4. Name of physician performing surgery?  Not listed- [place]   5. What is your office phone and fax number? [place]

## 2016-04-16 NOTE — Telephone Encounter (Signed)
Prefer he wait until one year out from stent.

## 2016-04-17 ENCOUNTER — Telehealth: Payer: Self-pay | Admitting: Interventional Cardiology

## 2016-04-17 NOTE — Telephone Encounter (Signed)
New Message    Pt wife is returning [place] call about surgical clearance .  [facility] wants to have this done ASAP due to pt being in pain

## 2016-04-17 NOTE — Telephone Encounter (Signed)
Patient's wife calling about surgical clearance for patient. I advised the patient's wife of Dr. Thompson Caul recommendation that he prefers that the patient wait until it has been a year from his stent placement. The patient's wife states that the patient's right leg is really bad and that he has been falling and having difficulty walking and wants to know if it is possible for him to be cleared earlier. Please advise.

## 2016-04-17 NOTE — Telephone Encounter (Signed)
Left message for patient to call back  

## 2016-04-18 NOTE — Telephone Encounter (Signed)
Fax sent to requesting office.  

## 2016-04-18 NOTE — Telephone Encounter (Signed)
[name], MD at 04/18/2016 1:31 PM   Status: Signed    He is cleared to have the procedure performed. To do that he needs to hold Plavix for at least 7 days prior to the procedure. All should understand and be aware that there will be increased risk of stent clotting off Plavix.

## 2016-04-18 NOTE — Telephone Encounter (Signed)
Spoke with pt's wife and informed her of information provided by [name].  Wife verbalized understanding and was appreciative for help.

## 2016-04-18 NOTE — Telephone Encounter (Signed)
He is cleared to have the procedure performed. To do that he needs to hold Plavix for at least 7 days prior to the procedure. All should understand and be aware that there will be increased risk of stent clotting off Plavix.

## 2016-04-18 NOTE — Telephone Encounter (Signed)
1 year out from stent will be end of May 2018.

## 2016-04-29 ENCOUNTER — Ambulatory Visit
Admission: RE | Admit: 2016-04-29 | Discharge: 2016-04-29 | Disposition: A | Discharge status: Home / Self Care | Payer: Medicare Other | Source: Ambulatory Visit | Attending: Neurosurgery | Admitting: Neurosurgery

## 2016-04-29 VITALS — BP 118/82 | HR 82

## 2016-04-29 DIAGNOSIS — M5416 Radiculopathy, lumbar region: Secondary | ICD-10-CM

## 2016-04-29 DIAGNOSIS — G8929 Other chronic pain: Secondary | ICD-10-CM

## 2016-04-29 MED ORDER — DIAZEPAM 5 MG PO TABS
10.0000 mg | ORAL_TABLET | Freq: Once | ORAL | Status: AC
  Administered 2016-04-29: 10 mg via ORAL

## 2016-04-29 MED ORDER — IOPAMIDOL (ISOVUE-M 200) INJECTION 41%
15.0000 mL | Freq: Once | INTRAMUSCULAR | Status: AC
  Administered 2016-04-29: 15 mL via INTRATHECAL

## 2016-04-29 MED ORDER — ONDANSETRON HCL 4 MG/2ML IJ SOLN
4.0000 mg | Freq: Once | INTRAMUSCULAR | Status: AC
  Administered 2016-04-29: 4 mg via INTRAMUSCULAR

## 2016-04-29 MED ORDER — MEPERIDINE HCL 100 MG/ML IJ SOLN
75.0000 mg | Freq: Once | INTRAMUSCULAR | Status: AC
  Administered 2016-04-29: 75 mg via INTRAMUSCULAR

## 2016-04-29 NOTE — Progress Notes (Signed)
Patient states he has been off Plavix for the past five days.  He states he has been off Cymbalta and Tramadol for at least the past two days.  jkl

## 2016-04-29 NOTE — Discharge Instructions (Addendum)
Myelogram Discharge Instructions  1. Go home and rest quietly for the next 24 hours.  It is important to lie flat for the next 24 hours.  Get up only to go to the restroom.  You may lie in the bed or on a couch on your back, your stomach, your left side or your right side.  You may have one pillow under your head.  You may have pillows between your knees while you are on your side or under your knees while you are on your back.  2. DO NOT drive today.  Recline the seat as far back as it will go, while still wearing your seat belt, on the way home.  3. You may get up to go to the bathroom as needed.  You may sit up for 10 minutes to eat.  You may resume your normal diet and medications unless otherwise indicated.  Drink lots of extra fluids today and tomorrow.  4. The incidence of headache, nausea, or vomiting is about 5% (one in 20 patients).  If you develop a headache, lie flat and drink plenty of fluids until the headache goes away.  Caffeinated beverages may be helpful.  If you develop severe nausea and vomiting or a headache that does not go away with flat bed rest, call [phone].  5. You may resume normal activities after your 24 hours of bed rest is over; however, do not exert yourself strongly or do any heavy lifting tomorrow. If when you get up you have a headache when standing, go back to bed and force fluids for another 24 hours.  6. Call your physician for a follow-up appointment.  The results of your myelogram will be sent directly to your physician by the following day.  7. If you have any questions or if complications develop after you arrive home, please call [phone].  Discharge instructions have been explained to the patient.  The patient, or the person responsible for the patient, fully understands these instructions.       MAY RESUME PLAVIX TODAY.   May resume Tramadol and Cymbalta on April 30, 2016, after 10:30 am.

## 2016-04-30 ENCOUNTER — Emergency Department (HOSPITAL_COMMUNITY): Payer: Medicare Other

## 2016-04-30 ENCOUNTER — Observation Stay (HOSPITAL_COMMUNITY)
Admission: EM | Admit: 2016-04-30 | Discharge: 2016-05-01 | Disposition: A | Discharge status: Home / Self Care | Payer: Medicare Other | Attending: Internal Medicine | Admitting: Internal Medicine

## 2016-04-30 ENCOUNTER — Encounter (HOSPITAL_COMMUNITY): Payer: Self-pay

## 2016-04-30 DIAGNOSIS — E119 Type 2 diabetes mellitus without complications: Secondary | ICD-10-CM | POA: Insufficient documentation

## 2016-04-30 DIAGNOSIS — I25110 Atherosclerotic heart disease of native coronary artery with unstable angina pectoris: Secondary | ICD-10-CM | POA: Diagnosis not present

## 2016-04-30 DIAGNOSIS — Z23 Encounter for immunization: Secondary | ICD-10-CM | POA: Diagnosis not present

## 2016-04-30 DIAGNOSIS — E669 Obesity, unspecified: Secondary | ICD-10-CM | POA: Diagnosis present

## 2016-04-30 DIAGNOSIS — I10 Essential (primary) hypertension: Secondary | ICD-10-CM | POA: Insufficient documentation

## 2016-04-30 DIAGNOSIS — Z7984 Long term (current) use of oral hypoglycemic drugs: Secondary | ICD-10-CM | POA: Diagnosis not present

## 2016-04-30 DIAGNOSIS — R079 Chest pain, unspecified: Secondary | ICD-10-CM

## 2016-04-30 DIAGNOSIS — E118 Type 2 diabetes mellitus with unspecified complications: Secondary | ICD-10-CM | POA: Diagnosis present

## 2016-04-30 DIAGNOSIS — E1159 Type 2 diabetes mellitus with other circulatory complications: Secondary | ICD-10-CM

## 2016-04-30 DIAGNOSIS — E876 Hypokalemia: Secondary | ICD-10-CM | POA: Diagnosis present

## 2016-04-30 DIAGNOSIS — Z7982 Long term (current) use of aspirin: Secondary | ICD-10-CM | POA: Diagnosis not present

## 2016-04-30 DIAGNOSIS — Z79899 Other long term (current) drug therapy: Secondary | ICD-10-CM | POA: Insufficient documentation

## 2016-04-30 DIAGNOSIS — I252 Old myocardial infarction: Secondary | ICD-10-CM

## 2016-04-30 DIAGNOSIS — Z87891 Personal history of nicotine dependence: Secondary | ICD-10-CM | POA: Diagnosis not present

## 2016-04-30 DIAGNOSIS — I200 Unstable angina: Secondary | ICD-10-CM

## 2016-04-30 DIAGNOSIS — I2510 Atherosclerotic heart disease of native coronary artery without angina pectoris: Secondary | ICD-10-CM | POA: Diagnosis present

## 2016-04-30 DIAGNOSIS — E785 Hyperlipidemia, unspecified: Secondary | ICD-10-CM | POA: Diagnosis present

## 2016-04-30 LAB — BASIC METABOLIC PANEL
Anion gap: 10 (ref 5–15)
BUN: 20 mg/dL (ref 6–20)
CO2: 27 mmol/L (ref 22–32)
Calcium: 9 mg/dL (ref 8.9–10.3)
Chloride: 102 mmol/L (ref 101–111)
Creatinine, Ser: 1.09 mg/dL (ref 0.61–1.24)
GFR calc Af Amer: >60 mL/min (ref >60)
GFR calc non Af Amer: >60 mL/min (ref >60)
Glucose, Bld: 160 mg/dL — ABNORMAL HIGH (ref 65–99)
Potassium: 3.1 mmol/L — ABNORMAL LOW (ref 3.5–5.1)
Sodium: 139 mmol/L (ref 135–145)

## 2016-04-30 LAB — D-DIMER, QUANTITATIVE (NOT AT ARMC): D-Dimer, Quant: 0.27 ug/mL-FEU (ref 0.00–0.50)

## 2016-04-30 LAB — CBC
HCT: 37.3 % — ABNORMAL LOW (ref 39.0–52.0)
Hemoglobin: 12.3 g/dL — ABNORMAL LOW (ref 13.0–17.0)
MCH: 27.3 pg (ref 26.0–34.0)
MCHC: 33 g/dL (ref 30.0–36.0)
MCV: 82.7 fL (ref 78.0–100.0)
Platelets: 277 10*3/uL (ref 150–400)
RBC: 4.51 MIL/uL (ref 4.22–5.81)
RDW: 16 % — ABNORMAL HIGH (ref 11.5–15.5)
WBC: 7.8 10*3/uL (ref 4.0–10.5)

## 2016-04-30 LAB — I-STAT TROPONIN, ED: Troponin i, poc: 0.02 ng/mL (ref 0.00–0.08)

## 2016-04-30 MED ORDER — MORPHINE SULFATE (PF) 4 MG/ML IV SOLN
4.0000 mg | Freq: Once | INTRAVENOUS | Status: AC
  Administered 2016-04-30: 4 mg via INTRAVENOUS
  Filled 2016-04-30: qty 1

## 2016-04-30 MED ORDER — NITROGLYCERIN 2 % TD OINT
1.0000 [in_us] | TOPICAL_OINTMENT | Freq: Once | TRANSDERMAL | Status: AC
  Administered 2016-04-30: 1 [in_us] via TOPICAL
  Filled 2016-04-30: qty 1

## 2016-04-30 MED ORDER — ONDANSETRON HCL 4 MG/2ML IJ SOLN
4.0000 mg | Freq: Once | INTRAMUSCULAR | Status: AC
  Administered 2016-04-30: 4 mg via INTRAVENOUS
  Filled 2016-04-30: qty 2

## 2016-04-30 NOTE — ED Notes (Signed)
Patient transported to X-ray 

## 2016-04-30 NOTE — ED Provider Notes (Signed)
[facility] DEPT Provider Note   CSN: [id number] Arrival date & time: 04/30/16  2135     History   Chief Complaint Chief Complaint  Patient presents with  . Chest Pain    HPI Thomas Mcclure is a 61 y.o. male.  Pt presents to the ED today with left sided chest pain that radiates to his left arm.  The pt said it started around 1900 while he was at choir practice.  He took 2 of his nitro without relief and EMS gave him 1 nitro w/o relief.  The pt denies sob or n/v.  Pt did have a NSTEMI in May of 2017 which required stent placement to RCA.  Pt has been off his Plavix for the past 5 days so that he could get a lumbar myelogram yesterday.  He has that because of right leg weakness.      Past Medical History:  Diagnosis Date  . Arthritis    Rheumatoid  . Baker's cyst    Left calf  . CAD in native artery, 07/15/15 PCI of RCA with DES 07/16/2015   a.   NSTEMI 5/17: LHC - pLAD 20, pLCx 20, OM1 30, pRCA 100, EF 45-50%>> PCI: 2.5 x 24 mm Promus DES to RCA  //  b.   Echo 5/17: mild LVH, EF 50-55%, no RWMA, mod RVE //  c. LHC 6/17: pLAD 20, pLCx 20, OM1 50, pRCA stent ok, EF 35-45% with mod sized inf wall and basal segment aneurysm  . Chronic back pain   . Cyst (solitary) of breast    ? cyst left calf ,knee  . Diabetes mellitus    diet controlled  . GERD (gastroesophageal reflux disease)   . Hyperlipidemia   . Hypertension    Dr. [name] [phone]  . Ischemic cardiomyopathy    a. LV-gram at time of LHC in 6/17 with EF 35-45%  //  b. Echo 7/17: EF 45-50%, inferior HK, grade 1 diastolic dysfunction, mildly dilated aortic root, moderately reduced RVSF, mild RAE  . Myocardial infarction 2017  . 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 Syncope 12/30/2015  . Near syncope   . Bradycardia   . Chest pain 12/03/2015  . Left ventricular aneurysm 12/03/2015  . Ischemic cardiomyopathy   . Other fatigue  08/24/2015  . Pain in the chest   . Unstable angina ([place]) 08/06/2015  . CAD (coronary artery disease) 07/16/2015  . Hypokalemia 07/16/2015  . History of non-ST elevation myocardial infarction (NSTEMI) 07/15/2015  . Hypertension 07/15/2015  . Hyperlipidemia with target LDL less than 100 07/15/2015  . Chronic back pain 07/15/2015  . Diabetes mellitus ([place]) 07/15/2015  . GERD (gastroesophageal reflux disease) 06/18/2012  . Chronic radicular low back pain 06/18/2012    Past Surgical History:  Procedure Laterality Date  . CARDIAC CATHETERIZATION N/A 07/15/2015   Procedure: Left Heart Cath and Coronary Angiography;  Surgeon: [name], MD;  Location: [facility];  Service: Cardiovascular;  Laterality: N/A;  . CARDIAC CATHETERIZATION N/A 07/15/2015   Procedure: Coronary Stent Intervention;  Surgeon: [name], MD;  Location: [facility];  Service: Cardiovascular;  Laterality: N/A;  . CARDIAC CATHETERIZATION N/A 08/07/2015   Procedure: Left Heart Cath and Coronary Angiography;  Surgeon: [name], MD;  Location: [facility];  Service: Cardiovascular;  Laterality: N/A;  . CORONARY STENT PLACEMENT  07/2015  .  KNEE SURGERY    . LUMBAR SPINE SURGERY  03/2009  & 2012  . MASS EXCISION N/A 04/19/2012   Procedure: removal of posterior cervical lipoma;  Surgeon: [name], MD;  Location: [facility];  Service: Neurosurgery;  Laterality: N/A;  Removal of posterior cervical lipoma  . SPINAL CORD STIMULATOR INSERTION N/A 01/07/2013   Procedure:  SPINAL CORD STIMULATOR INSERTION;  Surgeon: [name], MD;  Location: [facility];  Service: Neurosurgery;  Laterality: N/A;  . WISDOM TOOTH EXTRACTION     hx of       Home Medications    Prior to Admission medications   Medication Sig Start Date End Date Taking? Authorizing Provider  aspirin EC 81 MG tablet Take 1 tablet (81 mg total) by mouth daily. 07/18/15  Yes Alexa Angela Burke, MD  atorvastatin (LIPITOR) 80 MG  tablet Take 80 mg by mouth daily.   Yes Historical Provider, MD  clopidogrel (PLAVIX) 75 MG tablet Take 1 tablet (75 mg total) by mouth daily. 01/28/16  Yes [name], MD  diazepam (VALIUM) 10 MG tablet Take 10 mg by mouth 2 (two) times daily. For spasms   Yes Historical Provider, MD  DULoxetine (CYMBALTA) 60 MG capsule Take 60 mg by mouth daily.   Yes Historical Provider, MD  hydrochlorothiazide (HYDRODIURIL) 12.5 MG tablet Take 1 tablet (12.5 mg total) by mouth daily. 02/19/16  Yes [name], MD  lisinopril (PRINIVIL,ZESTRIL) 20 MG tablet Take 1 tablet (20 mg total) by mouth daily. 02/06/16 05/06/16 Yes [name], MD  metFORMIN (GLUCOPHAGE-XR) 750 MG 24 hr tablet Take 750 mg by mouth daily with breakfast.  06/07/15  Yes Historical Provider, MD  Multiple Vitamin (MULTIVITAMIN WITH MINERALS) TABS tablet Take 1 tablet by mouth daily.   Yes Historical Provider, MD  nitroGLYCERIN (NITROSTAT) 0.4 MG SL tablet Place 1 tablet (0.4 mg total) under the tongue every 5 (five) minutes as needed for chest pain. 02/20/16  Yes [name], MD  omeprazole (PRILOSEC) 40 MG capsule Take 40 mg by mouth daily.   Yes Historical Provider, MD  Oxycodone HCl 10 MG TABS Take 10 mg by mouth every 6 (six) hours. scheduled   Yes Historical Provider, MD  polyethylene glycol powder (GLYCOLAX/MIRALAX) powder Take 17 g by mouth daily as needed (constipation). Mix in 8 oz water, juice, soda, coffee or tea and drink 06/07/15  Yes Historical Provider, MD  pregabalin (LYRICA) 100 MG capsule Take 100 mg by mouth 2 (two) times daily.    Yes Historical Provider, MD  topiramate (TOPAMAX) 50 MG tablet Take 50 mg by mouth 2 (two) times daily as needed (nerve pain/ leg cramps).    Yes Historical Provider, MD  traMADol (ULTRAM) 50 MG tablet Take 50 mg by mouth 3 (three) times daily. scheduled   Yes Historical Provider, MD    Family History Family History  Problem Relation Age of Onset  . Heart disease Mother   . Prostate cancer  Brother     Social History Social History  Substance Use Topics  . Smoking status: Former Smoker    Years: 15.00    Types: Cigarettes  . Smokeless tobacco: Former Systems developer    Quit date: 04/08/2015     Comment: 5 cig a week  . Alcohol use 0.0 oz/week     Comment: Couple 40 ounces daily until 2012     Allergies   Brilinta [ticagrelor]; Methylprednisolone; Prednisone; Toradol [ketorolac tromethamine]; and Adhesive [tape]   Review  of Systems Review of Systems  Cardiovascular: Positive for chest pain.  All other systems reviewed and are negative.    Physical Exam Updated Vital Signs BP 124/94   Pulse 64   Temp 98.5 F (36.9 C) (Oral)   Resp 10   SpO2 98%   Physical Exam  Constitutional: He is oriented to person, place, and time. He appears well-developed and well-nourished.  HENT:  Head: Normocephalic and atraumatic.  Right Ear: External ear normal.  Left Ear: External ear normal.  Nose: Nose normal.  Mouth/Throat: Oropharynx is clear and moist.  Eyes: Conjunctivae and EOM are normal. Pupils are equal, round, and reactive to light.  Neck: Normal range of motion. Neck supple.  Cardiovascular: Normal rate, regular rhythm, normal heart sounds and intact distal pulses.   Pulmonary/Chest: Effort normal and breath sounds normal.  Abdominal: Soft. Bowel sounds are normal.  Musculoskeletal: Normal range of motion.  Neurological: He is alert and oriented to person, place, and time.  Skin: Skin is warm.  Psychiatric: He has a normal mood and affect. His behavior is normal. Judgment and thought content normal.  Nursing note and vitals reviewed.    ED Treatments / Results  Labs (all labs ordered are listed, but only abnormal results are displayed) Labs Reviewed  BASIC METABOLIC PANEL - Abnormal; Notable for the following:       Result Value   Potassium 3.1 (*)    Glucose, Bld 160 (*)    All other components within normal limits  CBC - Abnormal; Notable for the following:     Hemoglobin 12.3 (*)    HCT 37.3 (*)    RDW 16.0 (*)    All other components within normal limits  D-DIMER, QUANTITATIVE (NOT AT [facility])  Randolm Idol, ED    EKG  EKG Interpretation  Date/Time:  Wednesday April 30 2016 21:43:26 EST Ventricular Rate:  76 PR Interval:    QRS Duration: 126 QT Interval:  419 QTC Calculation: 472 R Axis:   9 Text Interpretation:  Sinus rhythm Nonspecific intraventricular conduction delay Abnormal inferior Q waves Borderline T abnormalities, anterior leads Confirmed by Gilford Raid MD, Cherylin Waguespack ([id number]) on 04/30/2016 10:40:22 PM       Radiology Dg Chest 2 View  Result Date: 04/30/2016 CLINICAL DATA:  Central chest pain EXAM: CHEST  2 VIEW COMPARISON:  12/30/2015 FINDINGS: Atelectasis or scarring at the left lung base. No acute infiltrate or effusion. Stable mild cardiomegaly. No pneumothorax. Stimulator electrodes over the thoracic spine. IMPRESSION: Mild cardiomegaly without overt failure Electronically Signed   By: Donavan Foil M.D.   On: 04/30/2016 22:44   Ct Lumbar Spine W Contrast  Result Date: 04/29/2016 CLINICAL DATA:  Progressive bilateral lower extremity weakness, right greater than left. EXAM: LUMBAR MYELOGRAM FLUOROSCOPY TIME:  Radiation Exposure Index (as provided by the fluoroscopic device): 703.05 uGy*m2 Fluoroscopy Time:  1 minutes 11 seconds Number of Acquired Images:  15 PROCEDURE: After thorough discussion of risks and benefits of the procedure including bleeding, infection, injury to nerves, blood vessels, adjacent structures as well as headache and CSF leak, written and oral informed consent was obtained. Consent was obtained by Dr. San Morelle. Time out form was completed. Patient was positioned prone on the fluoroscopy table. Local anesthesia was provided with 1% lidocaine without epinephrine after prepped and draped in the usual sterile fashion. Puncture was performed at L5 using a 3 1/2 inch 22-gauge spinal needle via midline  approach. Using a single pass through the dura, the needle was placed  within the thecal sac, with return of clear CSF. 15 mL of Isovue M 200 was injected into the thecal sac, with normal opacification of the nerve roots and cauda equina consistent with free flow within the subarachnoid space. I personally performed the lumbar puncture and administered the intrathecal contrast. I also personally supervised acquisition of the myelogram images. TECHNIQUE: Contiguous axial images were obtained through the Lumbar spine after the intrathecal infusion of infusion. Coronal and sagittal reconstructions were obtained of the axial image sets. COMPARISON:  Lumbar spine radiographs 04/11/2016. MRI lumbar spine without and with contrast 07/26/2012. Lumbar myelogram 05/16/2011. FINDINGS: LUMBAR MYELOGRAM FINDINGS: Progressive adjacent level disease is present at L to 3. There is further loss of disc height and endplate sclerotic changes well. Moderate central canal stenosis is also present at L1-2. The narrowing at L2-3 is worse with standing. There is no abnormal motion with flexion or extension. CT LUMBAR MYELOGRAM FINDINGS: The lumbar spine is imaged from the midbody of T12 through the midbody of S3. Five non rib-bearing lumbar type vertebral bodies are present. Conus medullaris terminates at L1-2. Solid osseous fusion is present anteriorly and posteriorly at L3-4 and L4-5. The there significant lucency surrounding the right pedicle screw at S1 suggesting loosening. Gas is present in the disc space at L5-S1. Limited imaging of the abdomen demonstrates atherosclerosis of the aorta extending into the iliac artery is. No significant adenopathy is present. L1-2: A broad-based disc protrusion is similar to the prior study. There is progression of advanced facet hypertrophy. Prominent epidural fat contributes to crowding of the nerve roots. Mild foraminal narrowing is present bilaterally, right greater than left. L2-3: A broad-based  disc protrusion has progressed. Advanced facet hypertrophy has progressed as well. The there is progressive central canal stenosis. Progressive severe foraminal stenosis is also evident, right greater than left. L3-4: Solid fusion is present. No residual recurrent stenosis is evident. L4-5: Solid fusion is present. A wide laminectomy is noted. There is no residual recurrent stenosis. L5-S1: A wide laminectomy is present. Severe foraminal stenosis is again noted bilaterally, right greater than left IMPRESSION: 1. Solid fusion at L3-4 and L4-5 without significant stenosis at these levels. 2. Progressive facet disease and disc protrusion at L2-3 with progressive moderate central canal stenosis and severe foraminal narrowing. 3. Progressive severe facet arthropathy and foraminal stenosis at L5-S1 with nonunion at this level. 4. Epidural lipomatosis at L1-2 and L2-3 contributes to crowding of the nerve roots. Electronically Signed   By: San Morelle M.D.   On: 04/29/2016 14:09   Dg Myelography Lumbar Inj Lumbosacral  Result Date: 04/29/2016 CLINICAL DATA:  Progressive bilateral lower extremity weakness, right greater than left. EXAM: LUMBAR MYELOGRAM FLUOROSCOPY TIME:  Radiation Exposure Index (as provided by the fluoroscopic device): 703.05 uGy*m2 Fluoroscopy Time:  1 minutes 11 seconds Number of Acquired Images:  15 PROCEDURE: After thorough discussion of risks and benefits of the procedure including bleeding, infection, injury to nerves, blood vessels, adjacent structures as well as headache and CSF leak, written and oral informed consent was obtained. Consent was obtained by Dr. San Morelle. Time out form was completed. Patient was positioned prone on the fluoroscopy table. Local anesthesia was provided with 1% lidocaine without epinephrine after prepped and draped in the usual sterile fashion. Puncture was performed at L5 using a 3 1/2 inch 22-gauge spinal needle via midline approach. Using a  single pass through the dura, the needle was placed within the thecal sac, with return of clear CSF. 15 mL of  Isovue M 200 was injected into the thecal sac, with normal opacification of the nerve roots and cauda equina consistent with free flow within the subarachnoid space. I personally performed the lumbar puncture and administered the intrathecal contrast. I also personally supervised acquisition of the myelogram images. TECHNIQUE: Contiguous axial images were obtained through the Lumbar spine after the intrathecal infusion of infusion. Coronal and sagittal reconstructions were obtained of the axial image sets. COMPARISON:  Lumbar spine radiographs 04/11/2016. MRI lumbar spine without and with contrast 07/26/2012. Lumbar myelogram 05/16/2011. FINDINGS: LUMBAR MYELOGRAM FINDINGS: Progressive adjacent level disease is present at L to 3. There is further loss of disc height and endplate sclerotic changes well. Moderate central canal stenosis is also present at L1-2. The narrowing at L2-3 is worse with standing. There is no abnormal motion with flexion or extension. CT LUMBAR MYELOGRAM FINDINGS: The lumbar spine is imaged from the midbody of T12 through the midbody of S3. Five non rib-bearing lumbar type vertebral bodies are present. Conus medullaris terminates at L1-2. Solid osseous fusion is present anteriorly and posteriorly at L3-4 and L4-5. The there significant lucency surrounding the right pedicle screw at S1 suggesting loosening. Gas is present in the disc space at L5-S1. Limited imaging of the abdomen demonstrates atherosclerosis of the aorta extending into the iliac artery is. No significant adenopathy is present. L1-2: A broad-based disc protrusion is similar to the prior study. There is progression of advanced facet hypertrophy. Prominent epidural fat contributes to crowding of the nerve roots. Mild foraminal narrowing is present bilaterally, right greater than left. L2-3: A broad-based disc protrusion  has progressed. Advanced facet hypertrophy has progressed as well. The there is progressive central canal stenosis. Progressive severe foraminal stenosis is also evident, right greater than left. L3-4: Solid fusion is present. No residual recurrent stenosis is evident. L4-5: Solid fusion is present. A wide laminectomy is noted. There is no residual recurrent stenosis. L5-S1: A wide laminectomy is present. Severe foraminal stenosis is again noted bilaterally, right greater than left IMPRESSION: 1. Solid fusion at L3-4 and L4-5 without significant stenosis at these levels. 2. Progressive facet disease and disc protrusion at L2-3 with progressive moderate central canal stenosis and severe foraminal narrowing. 3. Progressive severe facet arthropathy and foraminal stenosis at L5-S1 with nonunion at this level. 4. Epidural lipomatosis at L1-2 and L2-3 contributes to crowding of the nerve roots. Electronically Signed   By: San Morelle M.D.   On: 04/29/2016 14:09    Procedures Procedures (including critical care time)  Medications Ordered in ED Medications  ondansetron [facility]) injection 4 mg (4 mg Intravenous Given 04/30/16 2218)  morphine 4 MG/ML injection 4 mg (4 mg Intravenous Given 04/30/16 2218)  nitroGLYCERIN (NITROGLYN) 2 % ointment 1 inch (1 inch Topical Given 04/30/16 2354)  morphine 4 MG/ML injection 4 mg (4 mg Intravenous Given 04/30/16 2354)     Initial Impression / Assessment and Plan / ED Course  I have reviewed the triage vital signs and the nursing notes.  Pertinent labs & imaging results that were available during my care of the patient were reviewed by me and considered in my medical decision making (see chart for details).    Pt continues to have CP.  Pt d/w hospitalists who request that cardiology sees pt.  I spoke with Dr. Lucy Chris (cardiologists) who will see pt.  Cardiologist felt like pt was stable for the hospitalists to admit.  The pt rediscussed with Dr. Alcario Drought who will  admit.  Final Clinical Impressions(s) /  ED Diagnoses   Final diagnoses:  Unstable angina pectoris [facility])    New Prescriptions New Prescriptions   No medications on file     Isla Pence, MD 05/03/16 [phone]

## 2016-04-30 NOTE — ED Triage Notes (Signed)
Per EMS pt was at choir rehearsal and started having left side chest pain radiating to left arm; pt went home and took 2 nitro prior to Ems arrival. Pt was given 3rd nitro by Ems with no relief; pt had 324 mg ASA in route; pt denies SOB, n/v; pt state pain is intermit and hurts to talk; pt c/o pain at 8/10 on arrival. Pt a&ox 4; pt has hx of MI in May 2017 with stent placement; Dr. Carolynne Edouard at bedside on arrival.

## 2016-05-01 ENCOUNTER — Observation Stay (HOSPITAL_BASED_OUTPATIENT_CLINIC_OR_DEPARTMENT_OTHER): Payer: Medicare Other

## 2016-05-01 DIAGNOSIS — E876 Hypokalemia: Secondary | ICD-10-CM

## 2016-05-01 DIAGNOSIS — I252 Old myocardial infarction: Secondary | ICD-10-CM

## 2016-05-01 DIAGNOSIS — I10 Essential (primary) hypertension: Secondary | ICD-10-CM

## 2016-05-01 DIAGNOSIS — E669 Obesity, unspecified: Secondary | ICD-10-CM

## 2016-05-01 DIAGNOSIS — E785 Hyperlipidemia, unspecified: Secondary | ICD-10-CM

## 2016-05-01 DIAGNOSIS — E118 Type 2 diabetes mellitus with unspecified complications: Secondary | ICD-10-CM

## 2016-05-01 DIAGNOSIS — E1159 Type 2 diabetes mellitus with other circulatory complications: Secondary | ICD-10-CM

## 2016-05-01 DIAGNOSIS — I2510 Atherosclerotic heart disease of native coronary artery without angina pectoris: Secondary | ICD-10-CM

## 2016-05-01 DIAGNOSIS — R079 Chest pain, unspecified: Secondary | ICD-10-CM | POA: Diagnosis present

## 2016-05-01 DIAGNOSIS — I25110 Atherosclerotic heart disease of native coronary artery with unstable angina pectoris: Secondary | ICD-10-CM | POA: Diagnosis not present

## 2016-05-01 DIAGNOSIS — E119 Type 2 diabetes mellitus without complications: Secondary | ICD-10-CM | POA: Diagnosis not present

## 2016-05-01 LAB — NM MYOCAR SINGLE W/PLANAR W/WALL MOTION AND EF
Estimated workload: 1 METS
Exercise duration (min): 5 min
Exercise duration (sec): 19 s
MPHR: 160 {beats}/min
Peak HR: 98 {beats}/min
Percent HR: 61 %
Rest HR: 81 {beats}/min

## 2016-05-01 LAB — GLUCOSE, CAPILLARY
Glucose-Capillary: 110 mg/dL — ABNORMAL HIGH (ref 65–99)
Glucose-Capillary: 98 mg/dL (ref 65–99)
Glucose-Capillary: 178 mg/dL — ABNORMAL HIGH (ref 65–99)
Glucose-Capillary: 121 mg/dL — ABNORMAL HIGH (ref 65–99)

## 2016-05-01 LAB — TROPONIN I
Troponin I: 0.04 ng/mL (ref <0.03)
Troponin I: 0.05 ng/mL (ref <0.03)
Troponin I: 0.04 ng/mL (ref <0.03)

## 2016-05-01 LAB — HIV ANTIBODY (ROUTINE TESTING W REFLEX): HIV Screen 4th Generation wRfx: NONREACTIVE

## 2016-05-01 MED ORDER — SODIUM CHLORIDE 0.9 % IV SOLN
30.0000 meq | Freq: Once | INTRAVENOUS | Status: AC
  Administered 2016-05-01: 30 meq via INTRAVENOUS
  Filled 2016-05-01: qty 15

## 2016-05-01 MED ORDER — DULOXETINE HCL 60 MG PO CPEP
60.0000 mg | ORAL_CAPSULE | Freq: Every day | ORAL | Status: DC
  Administered 2016-05-01: 60 mg via ORAL
  Filled 2016-05-01: qty 1

## 2016-05-01 MED ORDER — INSULIN ASPART 100 UNIT/ML ~~LOC~~ SOLN
0.0000 [IU] | SUBCUTANEOUS | Status: DC
  Administered 2016-05-01: 1 [IU] via SUBCUTANEOUS
  Administered 2016-05-01: 2 [IU] via SUBCUTANEOUS

## 2016-05-01 MED ORDER — ACETAMINOPHEN 325 MG PO TABS
650.0000 mg | ORAL_TABLET | ORAL | Status: DC | PRN
  Administered 2016-05-01: 650 mg via ORAL
  Filled 2016-05-01: qty 2

## 2016-05-01 MED ORDER — OXYCODONE HCL 5 MG PO TABS
10.0000 mg | ORAL_TABLET | Freq: Once | ORAL | Status: AC
  Administered 2016-05-01: 10 mg via ORAL
  Filled 2016-05-01: qty 2

## 2016-05-01 MED ORDER — ATORVASTATIN CALCIUM 80 MG PO TABS
80.0000 mg | ORAL_TABLET | Freq: Every day | ORAL | Status: DC
  Administered 2016-05-01: 80 mg via ORAL
  Filled 2016-05-01: qty 1

## 2016-05-01 MED ORDER — CLOPIDOGREL BISULFATE 75 MG PO TABS
75.0000 mg | ORAL_TABLET | Freq: Every day | ORAL | Status: DC
  Administered 2016-05-01: 75 mg via ORAL
  Filled 2016-05-01: qty 1

## 2016-05-01 MED ORDER — PREGABALIN 50 MG PO CAPS
100.0000 mg | ORAL_CAPSULE | Freq: Two times a day (BID) | ORAL | Status: DC
  Administered 2016-05-01: 100 mg via ORAL
  Filled 2016-05-01: qty 2

## 2016-05-01 MED ORDER — TECHNETIUM TC 99M TETROFOSMIN IV KIT
10.0000 | PACK | Freq: Once | INTRAVENOUS | Status: AC | PRN
  Administered 2016-05-01: 10 via INTRAVENOUS

## 2016-05-01 MED ORDER — HYDROCHLOROTHIAZIDE 25 MG PO TABS
12.5000 mg | ORAL_TABLET | Freq: Every day | ORAL | Status: DC
  Administered 2016-05-01: 12.5 mg via ORAL
  Filled 2016-05-01: qty 1

## 2016-05-01 MED ORDER — NITROGLYCERIN 0.4 MG SL SUBL: 0.4000 mg | SUBLINGUAL_TABLET | SUBLINGUAL | Status: DC | PRN

## 2016-05-01 MED ORDER — OXYCODONE HCL 5 MG PO TABS
10.0000 mg | ORAL_TABLET | Freq: Four times a day (QID) | ORAL | Status: DC
  Administered 2016-05-01 (×2): 10 mg via ORAL
  Filled 2016-05-01 (×2): qty 2

## 2016-05-01 MED ORDER — TOPIRAMATE 100 MG PO TABS: 50.0000 mg | ORAL_TABLET | Freq: Two times a day (BID) | ORAL | Status: DC | PRN

## 2016-05-01 MED ORDER — ADULT MULTIVITAMIN W/MINERALS CH
1.0000 | ORAL_TABLET | Freq: Every day | ORAL | Status: DC
  Administered 2016-05-01: 1 via ORAL
  Filled 2016-05-01: qty 1

## 2016-05-01 MED ORDER — ONDANSETRON HCL 4 MG/2ML IJ SOLN: 4.0000 mg | Freq: Four times a day (QID) | INTRAMUSCULAR | Status: DC | PRN

## 2016-05-01 MED ORDER — ASPIRIN EC 81 MG PO TBEC
81.0000 mg | DELAYED_RELEASE_TABLET | Freq: Every day | ORAL | Status: DC
  Administered 2016-05-01: 81 mg via ORAL
  Filled 2016-05-01: qty 1

## 2016-05-01 MED ORDER — PNEUMOCOCCAL VAC POLYVALENT 25 MCG/0.5ML IJ INJ
0.5000 mL | INJECTION | INTRAMUSCULAR | Status: AC
  Administered 2016-05-01: 0.5 mL via INTRAMUSCULAR
  Filled 2016-05-01: qty 0.5

## 2016-05-01 MED ORDER — TRAMADOL HCL 50 MG PO TABS
50.0000 mg | ORAL_TABLET | Freq: Three times a day (TID) | ORAL | Status: DC
Start: 2016-05-01 — End: 2016-05-01
  Administered 2016-05-01 (×2): 50 mg via ORAL
  Filled 2016-05-01 (×2): qty 1

## 2016-05-01 MED ORDER — REGADENOSON 0.4 MG/5ML IV SOLN
INTRAVENOUS | Status: AC
  Filled 2016-05-01: qty 5

## 2016-05-01 MED ORDER — HYDROCHLOROTHIAZIDE 25 MG PO TABS: 25.0000 mg | ORAL_TABLET | Freq: Every day | ORAL | 2 refills | Status: DC

## 2016-05-01 MED ORDER — PANTOPRAZOLE SODIUM 40 MG PO TBEC
80.0000 mg | DELAYED_RELEASE_TABLET | Freq: Every day | ORAL | Status: DC
  Administered 2016-05-01: 80 mg via ORAL
  Filled 2016-05-01: qty 2

## 2016-05-01 MED ORDER — POLYETHYLENE GLYCOL 3350 17 G PO PACK: 17.0000 g | PACK | Freq: Every day | ORAL | Status: DC | PRN

## 2016-05-01 MED ORDER — POLYETHYLENE GLYCOL 3350 17 GM/SCOOP PO POWD: 17.0000 g | Freq: Every day | ORAL | Status: DC | PRN

## 2016-05-01 MED ORDER — LISINOPRIL 20 MG PO TABS
20.0000 mg | ORAL_TABLET | Freq: Every day | ORAL | Status: DC
  Administered 2016-05-01: 20 mg via ORAL
  Filled 2016-05-01: qty 1

## 2016-05-01 MED ORDER — ENOXAPARIN SODIUM 40 MG/0.4ML ~~LOC~~ SOLN
40.0000 mg | Freq: Every day | SUBCUTANEOUS | Status: DC
  Administered 2016-05-01: 40 mg via SUBCUTANEOUS
  Filled 2016-05-01: qty 0.4

## 2016-05-01 MED ORDER — REGADENOSON 0.4 MG/5ML IV SOLN
0.4000 mg | Freq: Once | INTRAVENOUS | Status: AC
  Administered 2016-05-01: 0.4 mg via INTRAVENOUS
  Filled 2016-05-01: qty 5

## 2016-05-01 MED ORDER — TECHNETIUM TC 99M TETROFOSMIN IV KIT
30.0000 | PACK | Freq: Once | INTRAVENOUS | Status: AC | PRN
  Administered 2016-05-01: 30 via INTRAVENOUS

## 2016-05-01 MED ORDER — DIAZEPAM 5 MG PO TABS
10.0000 mg | ORAL_TABLET | Freq: Two times a day (BID) | ORAL | Status: DC
  Administered 2016-05-01: 10 mg via ORAL
  Filled 2016-05-01: qty 2

## 2016-05-01 NOTE — ED Notes (Signed)
Trop level reported to EDP

## 2016-05-01 NOTE — H&P (Signed)
History and Physical    Thomas Mcclure ZOX:[id number] DOB: 11-01-1955 DOA: 04/30/2016   PCP: [name], MD Chief Complaint:  Chief Complaint  Patient presents with  . Chest Pain    HPI: Thomas Mcclure is a 61 y.o. 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  Patient with recurrent chest pain in Jun, July, Oct, and Nov of last year.  Repeat cath in Jun performed which didn't show occlusion.  He again presents to the ED today with L sided chest pain.  Pain is not relieved by NTG, pain is relieved by his home Oxy IR.  Symptoms onset earlier today while in [employer] at [employer].  Was taken off of plavix 6 days ago so he could get myelogram yesterday.  ED Course: EKG non specific T wave changes in V3, trop neg x1, and 2nd trop is only 0.04.  Review of Systems: As per HPI otherwise 10 point review of systems negative.    Past Medical History:  Diagnosis Date  . Arthritis    Rheumatoid  . Baker's cyst    Left calf  . CAD in native artery, 07/15/15 PCI of RCA with DES 07/16/2015   a.   NSTEMI 5/17: LHC - pLAD 20, pLCx 20, OM1 30, pRCA 100, EF 45-50%>> PCI: 2.5 x 24 mm Promus DES to RCA  //  b.   Echo 5/17: mild LVH, EF 50-55%, no RWMA, mod RVE //  c. LHC 6/17: pLAD 20, pLCx 20, OM1 50, pRCA stent ok, EF 35-45% with mod sized inf wall and basal segment aneurysm  . Chronic back pain   . Cyst (solitary) of breast    ? cyst left calf ,knee  . Diabetes mellitus    diet controlled  . GERD (gastroesophageal reflux disease)   . Hyperlipidemia   . Hypertension    Dr. [name] [phone]  . Ischemic cardiomyopathy    a. LV-gram at time of LHC in 6/17 with EF 35-45%  //  b. Echo 7/17: EF 45-50%, inferior HK, grade 1 diastolic dysfunction, mildly dilated aortic root, moderately reduced RVSF, mild RAE  . Myocardial infarction 2017  . 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 CARDIAC CATHETERIZATION N/A 07/15/2015   Procedure: Left Heart Cath and Coronary Angiography;  Surgeon: [name], MD;  Location: [place] [facility];  Service: Cardiovascular;  Laterality: N/A;  . CARDIAC CATHETERIZATION N/A 07/15/2015   Procedure: Coronary Stent Intervention;  Surgeon: [name], MD;  Location: [facility] [facility];  Service: Cardiovascular;  Laterality: N/A;  . CARDIAC CATHETERIZATION N/A 08/07/2015   Procedure: Left Heart Cath and Coronary Angiography;  Surgeon: [name], MD;  Location: [place] [facility];  Service: Cardiovascular;  Laterality: N/A;  . CORONARY STENT PLACEMENT  07/2015  . KNEE SURGERY    . LUMBAR SPINE SURGERY  03/2009  & 2012  . MASS EXCISION N/A 04/19/2012   Procedure: removal of posterior cervical lipoma;  Surgeon: [name], MD;  Location: [facility];  Service: Neurosurgery;  Laterality: N/A;  Removal of posterior cervical lipoma  . SPINAL CORD STIMULATOR INSERTION N/A 01/07/2013   Procedure:  SPINAL CORD STIMULATOR INSERTION;  Surgeon: [name], MD;  Location: [facility];  Service: Neurosurgery;  Laterality: N/A;  . WISDOM TOOTH EXTRACTION  hx of     reports that he has quit smoking. His smoking use included Cigarettes. He quit after 15.00 years of use. He quit smokeless tobacco use about 12 months ago. He reports that he drinks alcohol. He reports that he does not use drugs.  Allergies  Allergen Reactions  . Brilinta [Ticagrelor] Other (See Comments)    SOB and chest pain   . Methylprednisolone Other (See Comments)    B/P dropped and diaphoresis  . Prednisone Other (See Comments)    B/P dropped and patient began to sweat  . Toradol [Ketorolac Tromethamine] Other (See Comments)    bp dropped, diaphoresis  . Adhesive [Tape] Rash and Other (See Comments)    "blisters" ( NO SURGICAL TAPE, PLEASE)    Family History  Problem Relation Age of Onset  . Heart disease Mother   . Prostate  cancer Brother       Prior to Admission medications   Medication Sig Start Date End Date Taking? Authorizing Provider  aspirin EC 81 MG tablet Take 1 tablet (81 mg total) by mouth daily. 07/18/15  Yes Alexa Angela Burke, MD  atorvastatin (LIPITOR) 80 MG tablet Take 80 mg by mouth daily.   Yes Historical Provider, MD  clopidogrel (PLAVIX) 75 MG tablet Take 1 tablet (75 mg total) by mouth daily. 01/28/16  Yes [name], MD  diazepam (VALIUM) 10 MG tablet Take 10 mg by mouth 2 (two) times daily. For spasms   Yes Historical Provider, MD  DULoxetine (CYMBALTA) 60 MG capsule Take 60 mg by mouth daily.   Yes Historical Provider, MD  hydrochlorothiazide (HYDRODIURIL) 12.5 MG tablet Take 1 tablet (12.5 mg total) by mouth daily. 02/19/16  Yes [name], MD  lisinopril (PRINIVIL,ZESTRIL) 20 MG tablet Take 1 tablet (20 mg total) by mouth daily. 02/06/16 05/06/16 Yes [name], MD  metFORMIN (GLUCOPHAGE-XR) 750 MG 24 hr tablet Take 750 mg by mouth daily with breakfast.  06/07/15  Yes Historical Provider, MD  Multiple Vitamin (MULTIVITAMIN WITH MINERALS) TABS tablet Take 1 tablet by mouth daily.   Yes Historical Provider, MD  nitroGLYCERIN (NITROSTAT) 0.4 MG SL tablet Place 1 tablet (0.4 mg total) under the tongue every 5 (five) minutes as needed for chest pain. 02/20/16  Yes [name], MD  omeprazole (PRILOSEC) 40 MG capsule Take 40 mg by mouth daily.   Yes Historical Provider, MD  Oxycodone HCl 10 MG TABS Take 10 mg by mouth every 6 (six) hours. scheduled   Yes Historical Provider, MD  polyethylene glycol powder (GLYCOLAX/MIRALAX) powder Take 17 g by mouth daily as needed (constipation). Mix in 8 oz water, juice, soda, coffee or tea and drink 06/07/15  Yes Historical Provider, MD  pregabalin (LYRICA) 100 MG capsule Take 100 mg by mouth 2 (two) times daily.    Yes Historical Provider, MD  topiramate (TOPAMAX) 50 MG tablet Take 50 mg by mouth 2 (two) times daily as needed (nerve pain/ leg cramps).    Yes  Historical Provider, MD  traMADol (ULTRAM) 50 MG tablet Take 50 mg by mouth 3 (three) times daily. scheduled   Yes Historical Provider, MD    Physical Exam: Vitals:   04/30/16 2300 04/30/16 2330 05/01/16 0300 05/01/16 0330  BP: 121/83 124/94 120/73 122/83  Pulse: 71 64 76 72  Resp: 16 10  13   Temp:      TempSrc:      SpO2: 99% 98% 90% 91%      Constitutional: NAD, calm,  comfortable Eyes: PERRL, lids and conjunctivae normal ENMT: Mucous membranes are moist. Posterior pharynx clear of any exudate or lesions.Normal dentition.  Neck: normal, supple, no masses, no thyromegaly Respiratory: clear to auscultation bilaterally, no wheezing, no crackles. Normal respiratory effort. No accessory muscle use.  Cardiovascular: Regular rate and rhythm, no murmurs / rubs / gallops. No extremity edema. 2+ pedal pulses. No carotid bruits.  Abdomen: no tenderness, no masses palpated. No hepatosplenomegaly. Bowel sounds positive.  Musculoskeletal: no clubbing / cyanosis. No joint deformity upper and lower extremities. Good ROM, no contractures. Normal muscle tone.  Skin: no rashes, lesions, ulcers. No induration Neurologic: CN 2-12 grossly intact. Sensation intact, DTR normal. Strength 5/5 in all 4.  Psychiatric: Normal judgment and insight. Alert and oriented x 3. Normal mood.    Labs on Admission: I have personally reviewed following labs and imaging studies  CBC:  Recent Labs Lab 04/30/16 2154  WBC 7.8  HGB 12.3*  HCT 37.3*  MCV 82.7  PLT 629   Basic Metabolic Panel:  Recent Labs Lab 04/30/16 2154  NA 139  K 3.1*  CL 102  CO2 27  GLUCOSE 160*  BUN 20  CREATININE 1.09  CALCIUM 9.0   GFR: CrCl cannot be calculated (Unknown ideal weight.). Liver Function Tests: No results for input(s): AST, ALT, ALKPHOS, BILITOT, PROT, ALBUMIN in the last 168 hours. No results for input(s): LIPASE, AMYLASE in the last 168 hours. No results for input(s): AMMONIA in the last 168  hours. Coagulation Profile: No results for input(s): INR, PROTIME in the last 168 hours. Cardiac Enzymes:  Recent Labs Lab 05/01/16 0119  TROPONINI 0.04*   BNP (last 3 results) No results for input(s): PROBNP in the last 8760 hours. HbA1C: No results for input(s): HGBA1C in the last 72 hours. CBG: No results for input(s): GLUCAP in the last 168 hours. Lipid Profile: No results for input(s): CHOL, HDL, LDLCALC, TRIG, CHOLHDL, LDLDIRECT in the last 72 hours. Thyroid Function Tests: No results for input(s): TSH, T4TOTAL, FREET4, T3FREE, THYROIDAB in the last 72 hours. Anemia Panel: No results for input(s): VITAMINB12, FOLATE, FERRITIN, TIBC, IRON, RETICCTPCT in the last 72 hours. Urine analysis:    Component Value Date/Time   COLORURINE YELLOW 12/03/2015 1805   APPEARANCEUR CLEAR 12/03/2015 1805   LABSPEC 1.009 12/03/2015 1805   PHURINE 6.0 12/03/2015 1805   GLUCOSEU NEGATIVE 12/03/2015 1805   HGBUR NEGATIVE 12/03/2015 1805   BILIRUBINUR NEGATIVE 12/03/2015 1805   KETONESUR NEGATIVE 12/03/2015 1805   PROTEINUR NEGATIVE 12/03/2015 1805   UROBILINOGEN 1.0 07/26/2012 1552   NITRITE NEGATIVE 12/03/2015 1805   LEUKOCYTESUR NEGATIVE 12/03/2015 1805   Sepsis Labs: @LABRCNTIP (procalcitonin:4,lacticidven:4) )No results found for this or any previous visit (from the past 240 hour(s)).   Radiological Exams on Admission: Dg Chest 2 View  Result Date: 04/30/2016 CLINICAL DATA:  Central chest pain EXAM: CHEST  2 VIEW COMPARISON:  12/30/2015 FINDINGS: Atelectasis or scarring at the left lung base. No acute infiltrate or effusion. Stable mild cardiomegaly. No pneumothorax. Stimulator electrodes over the thoracic spine. IMPRESSION: Mild cardiomegaly without overt failure Electronically Signed   By: Donavan Foil M.D.   On: 04/30/2016 22:44   Ct Lumbar Spine W Contrast  Result Date: 04/29/2016 CLINICAL DATA:  Progressive bilateral lower extremity weakness, right greater than left. EXAM:  LUMBAR MYELOGRAM FLUOROSCOPY TIME:  Radiation Exposure Index (as provided by the fluoroscopic device): 703.05 uGy*m2 Fluoroscopy Time:  1 minutes 11 seconds Number of Acquired Images:  15 PROCEDURE: After thorough discussion of  risks and benefits of the procedure including bleeding, infection, injury to nerves, blood vessels, adjacent structures as well as headache and CSF leak, written and oral informed consent was obtained. Consent was obtained by Dr. San Morelle. Time out form was completed. Patient was positioned prone on the fluoroscopy table. Local anesthesia was provided with 1% lidocaine without epinephrine after prepped and draped in the usual sterile fashion. Puncture was performed at L5 using a 3 1/2 inch 22-gauge spinal needle via midline approach. Using a single pass through the dura, the needle was placed within the thecal sac, with return of clear CSF. 15 mL of Isovue M 200 was injected into the thecal sac, with normal opacification of the nerve roots and cauda equina consistent with free flow within the subarachnoid space. I personally performed the lumbar puncture and administered the intrathecal contrast. I also personally supervised acquisition of the myelogram images. TECHNIQUE: Contiguous axial images were obtained through the Lumbar spine after the intrathecal infusion of infusion. Coronal and sagittal reconstructions were obtained of the axial image sets. COMPARISON:  Lumbar spine radiographs 04/11/2016. MRI lumbar spine without and with contrast 07/26/2012. Lumbar myelogram 05/16/2011. FINDINGS: LUMBAR MYELOGRAM FINDINGS: Progressive adjacent level disease is present at L to 3. There is further loss of disc height and endplate sclerotic changes well. Moderate central canal stenosis is also present at L1-2. The narrowing at L2-3 is worse with standing. There is no abnormal motion with flexion or extension. CT LUMBAR MYELOGRAM FINDINGS: The lumbar spine is imaged from the midbody of T12  through the midbody of S3. Five non rib-bearing lumbar type vertebral bodies are present. Conus medullaris terminates at L1-2. Solid osseous fusion is present anteriorly and posteriorly at L3-4 and L4-5. The there significant lucency surrounding the right pedicle screw at S1 suggesting loosening. Gas is present in the disc space at L5-S1. Limited imaging of the abdomen demonstrates atherosclerosis of the aorta extending into the iliac artery is. No significant adenopathy is present. L1-2: A broad-based disc protrusion is similar to the prior study. There is progression of advanced facet hypertrophy. Prominent epidural fat contributes to crowding of the nerve roots. Mild foraminal narrowing is present bilaterally, right greater than left. L2-3: A broad-based disc protrusion has progressed. Advanced facet hypertrophy has progressed as well. The there is progressive central canal stenosis. Progressive severe foraminal stenosis is also evident, right greater than left. L3-4: Solid fusion is present. No residual recurrent stenosis is evident. L4-5: Solid fusion is present. A wide laminectomy is noted. There is no residual recurrent stenosis. L5-S1: A wide laminectomy is present. Severe foraminal stenosis is again noted bilaterally, right greater than left IMPRESSION: 1. Solid fusion at L3-4 and L4-5 without significant stenosis at these levels. 2. Progressive facet disease and disc protrusion at L2-3 with progressive moderate central canal stenosis and severe foraminal narrowing. 3. Progressive severe facet arthropathy and foraminal stenosis at L5-S1 with nonunion at this level. 4. Epidural lipomatosis at L1-2 and L2-3 contributes to crowding of the nerve roots. Electronically Signed   By: San Morelle M.D.   On: 04/29/2016 14:09   Dg Myelography Lumbar Inj Lumbosacral  Result Date: 04/29/2016 CLINICAL DATA:  Progressive bilateral lower extremity weakness, right greater than left. EXAM: LUMBAR MYELOGRAM  FLUOROSCOPY TIME:  Radiation Exposure Index (as provided by the fluoroscopic device): 703.05 uGy*m2 Fluoroscopy Time:  1 minutes 11 seconds Number of Acquired Images:  15 PROCEDURE: After thorough discussion of risks and benefits of the procedure including bleeding, infection, injury to nerves,  blood vessels, adjacent structures as well as headache and CSF leak, written and oral informed consent was obtained. Consent was obtained by Dr. San Morelle. Time out form was completed. Patient was positioned prone on the fluoroscopy table. Local anesthesia was provided with 1% lidocaine without epinephrine after prepped and draped in the usual sterile fashion. Puncture was performed at L5 using a 3 1/2 inch 22-gauge spinal needle via midline approach. Using a single pass through the dura, the needle was placed within the thecal sac, with return of clear CSF. 15 mL of Isovue M 200 was injected into the thecal sac, with normal opacification of the nerve roots and cauda equina consistent with free flow within the subarachnoid space. I personally performed the lumbar puncture and administered the intrathecal contrast. I also personally supervised acquisition of the myelogram images. TECHNIQUE: Contiguous axial images were obtained through the Lumbar spine after the intrathecal infusion of infusion. Coronal and sagittal reconstructions were obtained of the axial image sets. COMPARISON:  Lumbar spine radiographs 04/11/2016. MRI lumbar spine without and with contrast 07/26/2012. Lumbar myelogram 05/16/2011. FINDINGS: LUMBAR MYELOGRAM FINDINGS: Progressive adjacent level disease is present at L to 3. There is further loss of disc height and endplate sclerotic changes well. Moderate central canal stenosis is also present at L1-2. The narrowing at L2-3 is worse with standing. There is no abnormal motion with flexion or extension. CT LUMBAR MYELOGRAM FINDINGS: The lumbar spine is imaged from the midbody of T12 through the  midbody of S3. Five non rib-bearing lumbar type vertebral bodies are present. Conus medullaris terminates at L1-2. Solid osseous fusion is present anteriorly and posteriorly at L3-4 and L4-5. The there significant lucency surrounding the right pedicle screw at S1 suggesting loosening. Gas is present in the disc space at L5-S1. Limited imaging of the abdomen demonstrates atherosclerosis of the aorta extending into the iliac artery is. No significant adenopathy is present. L1-2: A broad-based disc protrusion is similar to the prior study. There is progression of advanced facet hypertrophy. Prominent epidural fat contributes to crowding of the nerve roots. Mild foraminal narrowing is present bilaterally, right greater than left. L2-3: A broad-based disc protrusion has progressed. Advanced facet hypertrophy has progressed as well. The there is progressive central canal stenosis. Progressive severe foraminal stenosis is also evident, right greater than left. L3-4: Solid fusion is present. No residual recurrent stenosis is evident. L4-5: Solid fusion is present. A wide laminectomy is noted. There is no residual recurrent stenosis. L5-S1: A wide laminectomy is present. Severe foraminal stenosis is again noted bilaterally, right greater than left IMPRESSION: 1. Solid fusion at L3-4 and L4-5 without significant stenosis at these levels. 2. Progressive facet disease and disc protrusion at L2-3 with progressive moderate central canal stenosis and severe foraminal narrowing. 3. Progressive severe facet arthropathy and foraminal stenosis at L5-S1 with nonunion at this level. 4. Epidural lipomatosis at L1-2 and L2-3 contributes to crowding of the nerve roots. Electronically Signed   By: San Morelle M.D.   On: 04/29/2016 14:09    EKG: Independently reviewed.  Assessment/Plan Principal Problem:   Chest pain, rule out acute myocardial infarction Active Problems:   History of non-ST elevation myocardial infarction  (NSTEMI)   Hypertension   Diabetes mellitus ([place])    1. Chest pain ruleout - 1. See cards consult note 2. CP obs pathway 3. NPO 4. Stress test ordered 5. Tele monitor 6. Serial trops 7. If all above is negative then is safe for discharge per cards note 2.  H/O NSTEMI - continue ASA, resume plavix, continue statin 3. DM - 1. Hold metformin 2. Sensitive SSI Q4H   DVT prophylaxis: Lovenox Code Status: Full Family Communication: Family at bedside Consults called: Cards Admission status: Admit to obs   Dadrian Ballantine, [facility] Pager [phone] from 7PM-7AM  If 7AM-7PM, please contact the day physician for the patient [email or web address] Password [email or web address]  05/01/2016, 4:07 AM

## 2016-05-01 NOTE — Care Management Obs Status (Signed)
[place] NOTIFICATION   Patient Details  Name: Thomas Mcclure MRN: [id number] Date of Birth: 1956-01-30   Medicare Observation Status Notification Given:  Yes    Bethena Roys, RN 05/01/2016, 2:57 PM

## 2016-05-01 NOTE — Consult Note (Signed)
CARDIOLOGY CONSULT NOTE   Referring Physician: Dr. Gilford Raid Primary Physician: [name], MD Primary Cardiologist: [name] Reason for Consultation: cp    HPI: Thomas Mcclure is a 61 year old male with a history of NSTEMI in 06-2015 status post DES to the RCA complicated by LV aneurysm and chronic systolic heart failure, diabetes, hypertension, hyperlipidemia, and chronic back pain status post spinal stimulator who presents with recurrent atypical chest pain.  Since his NSTEMI, Mr. Thomas Mcclure has presented in 07-2015, 08-2015, 11-2015, and 12-2015 with intermittent left atypical chest pain which he describes as sharp and radiating to the left shoulder and arm. His pain is brought on by talking and motion and only partially relieved by rest. It is not relieved by nitroglycerin. He is able to distinguish this pain from his anginal pain by quality and severity. His workup for this thus far has included a second left heart catheterization in 07-2015 which showed nonobstructive coronary artery disease (20% proximal LAD, 20% proximal left circumflex, 50% ostial OM1) and multiple echocardiograms which most recently show an ejection fraction of 55% with akinesis of the basal to mid inferior walls.  Today, while in choir practice, he again began having the atypical chest pain. As he had recently stopped and then restarted his Clopidogrel for a lumbar myelogram, he became concerned that his stent may have occluded  and as such took 2 nitroglycerin tabs with no effect and then presented to the emergency department.   His presenting vital signs were stable. His EKG showed nonspecific T-wave changes in V3. His first set of enzymes was negative.  His chest pain has greatly improved with one dose of morphine.  He notes that Dr. [name] may perform back surgery on him within the next few weeks to months.  Review of Systems:     Cardiac Review of Systems: {Y] = yes [ ]  = no  Chest Pain [Y]  Resting SOB [  ] Exertional SOB  [  ]  Orthopnea [  ]   Pedal Edema [   ]    Palpitations [  ] Syncope  [Y]   Presyncope [   ]  General Review of Systems: [Y] = yes [  ]=no Constitional: recent weight change [  ]; anorexia [  ]; fatigue [Y]; nausea [  ]; night sweats [  ]; fever [  ]; or chills [  ];                                                                     Eyes : blurred vision [  ]; diplopia [   ]; vision changes [  ];  Amaurosis fugax[  ]; Resp: cough [  ];  wheezing[  ];  hemoptysis[  ];  PND [  ];  GI:  gallstones[  ], vomiting[  ];  dysphagia[  ]; melena[  ];  hematochezia [  ]; heartburn[  ];   GU: kidney stones [  ]; hematuria[  ];   dysuria [  ];  nocturia[  ]; incontinence [  ];             Skin: rash, swelling[  ];, hair loss[  ];  peripheral edema[  ];  or itching[  ]; Musculosketetal:  myalgias[  ];  joint swelling[  ];  joint erythema[  ];  joint pain[  ];  back pain[  ];  Heme/Lymph: bruising[  ];  bleeding[  ];  anemia[  ];  Neuro: TIA[  ];  headaches[  ];  stroke[  ];  vertigo[  ];  seizures[  ];   paresthesias[  ];  difficulty walking[Y];  Psych:depression[  ]; anxiety[  ];  Endocrine: diabetes[  ];  thyroid dysfunction[  ];  Other:  Past Medical History:  Diagnosis Date  . Arthritis    Rheumatoid  . Baker's cyst    Left calf  . CAD in native artery, 07/15/15 PCI of RCA with DES 07/16/2015   a.   NSTEMI 5/17: LHC - pLAD 20, pLCx 20, OM1 30, pRCA 100, EF 45-50%>> PCI: 2.5 x 24 mm Promus DES to RCA  //  b.   Echo 5/17: mild LVH, EF 50-55%, no RWMA, mod RVE //  c. LHC 6/17: pLAD 20, pLCx 20, OM1 50, pRCA stent ok, EF 35-45% with mod sized inf wall and basal segment aneurysm  . Chronic back pain   . Cyst (solitary) of breast    ? cyst left calf ,knee  . Diabetes mellitus    diet controlled  . GERD (gastroesophageal reflux disease)   . Hyperlipidemia   . Hypertension    Dr. [name] [phone]  . Ischemic cardiomyopathy    a. LV-gram at time of LHC in 6/17 with EF 35-45%   //  b. Echo 7/17: EF 45-50%, inferior HK, grade 1 diastolic dysfunction, mildly dilated aortic root, moderately reduced RVSF, mild RAE  . Myocardial infarction 2017  . 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 Authorizing Provider  aspirin EC 81 MG tablet Take 1 tablet (81 mg total) by mouth daily. 07/18/15  Yes Alexa Angela Burke, MD  atorvastatin (LIPITOR) 80 MG tablet Take 80 mg by mouth daily.   Yes Historical Provider, MD  clopidogrel (PLAVIX) 75 MG tablet Take 1 tablet (75 mg total) by mouth daily. 01/28/16  Yes [name], MD  diazepam (VALIUM) 10 MG tablet Take 10 mg by mouth 2 (two) times daily. For spasms   Yes Historical Provider, MD  DULoxetine (CYMBALTA) 60 MG capsule Take 60 mg by mouth daily.   Yes Historical Provider, MD  hydrochlorothiazide (HYDRODIURIL) 12.5 MG tablet Take 1 tablet (12.5 mg total) by mouth daily. 02/19/16  Yes [name], MD  lisinopril (PRINIVIL,ZESTRIL) 20 MG tablet Take 1 tablet (20 mg total) by mouth daily. 02/06/16 05/06/16 Yes [name], MD  metFORMIN (GLUCOPHAGE-XR) 750 MG 24 hr tablet Take 750 mg by mouth daily with breakfast.  06/07/15  Yes Historical Provider, MD  Multiple Vitamin (MULTIVITAMIN WITH MINERALS) TABS tablet Take 1 tablet by mouth daily.   Yes Historical Provider, MD  nitroGLYCERIN (NITROSTAT) 0.4 MG SL tablet Place 1 tablet (0.4 mg total) under the tongue every 5 (five) minutes as needed for chest pain. 02/20/16  Yes [name], MD  omeprazole (PRILOSEC) 40 MG capsule Take 40 mg by mouth daily.   Yes Historical Provider, MD  Oxycodone HCl 10 MG TABS Take 10 mg by mouth every 6 (six) hours. scheduled   Yes Historical Provider, MD  polyethylene glycol powder (GLYCOLAX/MIRALAX) powder Take 17 g by mouth daily as needed (constipation). Mix in 8 oz water, juice, soda, coffee or  tea and drink 06/07/15  Yes Historical Provider, MD  pregabalin (LYRICA) 100  MG capsule Take 100 mg by mouth 2 (two) times daily.    Yes Historical Provider, MD  topiramate (TOPAMAX) 50 MG tablet Take 50 mg by mouth 2 (two) times daily as needed (nerve pain/ leg cramps).    Yes Historical Provider, MD  traMADol (ULTRAM) 50 MG tablet Take 50 mg by mouth 3 (three) times daily. scheduled   Yes Historical Provider, MD    Allergies  Allergen Reactions  . Brilinta [Ticagrelor] Other (See Comments)    SOB and chest pain   . Methylprednisolone Other (See Comments)    B/P dropped and diaphoresis  . Prednisone Other (See Comments)    B/P dropped and patient began to sweat  . Toradol [Ketorolac Tromethamine] Other (See Comments)    bp dropped, diaphoresis  . Adhesive [Tape] Rash and Other (See Comments)    "blisters" ( NO SURGICAL TAPE, PLEASE)    Social History   Social History  . Marital status: Married    Spouse name: N/A  . Number of children: 5  . Years of education: N/A   Occupational History  . 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 Smoking status: Former Smoker    Years: 15.00    Types: Cigarettes  . Smokeless tobacco: Former Systems developer    Quit date: 04/08/2015     Comment: 5 cig a week  . Alcohol use 0.0 oz/week     Comment: Couple 40 ounces daily until 2012  . Drug use: No  . Sexual activity: Not on file   Other Topics Concern  . Not on file   Social History Narrative  . No narrative on file    Family History  Problem Relation Age of Onset  . Heart disease Mother   . Prostate cancer Brother     PHYSICAL EXAM: Vitals:   04/30/16 2300 04/30/16 2330  BP: 121/83 124/94  Pulse: 71 64  Resp: 16 10  Temp:      No intake or output data in the 24 hours ending 05/01/16 0034  General:  Well appearing. No respiratory difficulty HEENT: normal Neck: supple. no JVD. Carotids 2+ bilat; no bruits. No lymphadenopathy or thryomegaly appreciated. Cor: His chest pain is reproduced exactly by keep  palpation over the left chest. PMI nondisplaced. Regular rate & rhythm. No rubs, gallops or murmurs. Lungs: clear Abdomen: soft, nontender, nondistended. No hepatosplenomegaly. No bruits or masses. Good bowel sounds. Extremities: no cyanosis, clubbing, rash, edema Neuro: alert & oriented x 3, cranial nerves grossly intact. moves all 4 extremities w/o difficulty. Affect pleasant.  ECG: Normal sinus rhythm, inferior Q waves, no STE, nonspecific T-wave changes in lead V3   Results for orders placed or performed during the hospital encounter of 04/30/16 (from the past 24 hour(s))  Basic metabolic panel     Status: Abnormal   Collection Time: 04/30/16  9:54 PM  Result Value Ref Range   Sodium 139 135 - 145 mmol/L   Potassium 3.1 (L) 3.5 - 5.1 mmol/L   Chloride 102 101 - 111 mmol/L   CO2 27 22 - 32 mmol/L   Glucose, Bld 160 (H) 65 - 99 mg/dL   BUN 20 6 - 20 mg/dL   Creatinine, Ser 1.09 0.61 - 1.24 mg/dL   Calcium 9.0 8.9 - 10.3 mg/dL   GFR calc non Af Amer >60 >60 mL/min  GFR calc Af Amer >60 >60 mL/min   Anion gap 10 5 - 15  CBC     Status: Abnormal   Collection Time: 04/30/16  9:54 PM  Result Value Ref Range   WBC 7.8 4.0 - 10.5 K/uL   RBC 4.51 4.22 - 5.81 MIL/uL   Hemoglobin 12.3 (L) 13.0 - 17.0 g/dL   HCT 37.3 (L) 39.0 - 52.0 %   MCV 82.7 78.0 - 100.0 fL   MCH 27.3 26.0 - 34.0 pg   MCHC 33.0 30.0 - 36.0 g/dL   RDW 16.0 (H) 11.5 - 15.5 %   Platelets 277 150 - 400 K/uL  D-dimer, quantitative (not at [facility])     Status: None   Collection Time: 04/30/16  9:54 PM  Result Value Ref Range   D-Dimer, Quant 0.27 0.00 - 0.50 ug/mL-FEU  I-stat troponin, ED     Status: None   Collection Time: 04/30/16 10:07 PM  Result Value Ref Range   Troponin i, poc 0.02 0.00 - 0.08 ng/mL   Comment 3           Dg Chest 2 View  Result Date: 04/30/2016 CLINICAL DATA:  Central chest pain EXAM: CHEST  2 VIEW COMPARISON:  12/30/2015 FINDINGS: Atelectasis or scarring at the left lung base. No acute  infiltrate or effusion. Stable mild cardiomegaly. No pneumothorax. Stimulator electrodes over the thoracic spine. IMPRESSION: Mild cardiomegaly without overt failure Electronically Signed   By: Donavan Foil M.D.   On: 04/30/2016 22:44   Ct Lumbar Spine W Contrast  Result Date: 04/29/2016 CLINICAL DATA:  Progressive bilateral lower extremity weakness, right greater than left. EXAM: LUMBAR MYELOGRAM FLUOROSCOPY TIME:  Radiation Exposure Index (as provided by the fluoroscopic device): 703.05 uGy*m2 Fluoroscopy Time:  1 minutes 11 seconds Number of Acquired Images:  15 PROCEDURE: After thorough discussion of risks and benefits of the procedure including bleeding, infection, injury to nerves, blood vessels, adjacent structures as well as headache and CSF leak, written and oral informed consent was obtained. Consent was obtained by Dr. San Morelle. Time out form was completed. Patient was positioned prone on the fluoroscopy table. Local anesthesia was provided with 1% lidocaine without epinephrine after prepped and draped in the usual sterile fashion. Puncture was performed at L5 using a 3 1/2 inch 22-gauge spinal needle via midline approach. Using a single pass through the dura, the needle was placed within the thecal sac, with return of clear CSF. 15 mL of Isovue M 200 was injected into the thecal sac, with normal opacification of the nerve roots and cauda equina consistent with free flow within the subarachnoid space. I personally performed the lumbar puncture and administered the intrathecal contrast. I also personally supervised acquisition of the myelogram images. TECHNIQUE: Contiguous axial images were obtained through the Lumbar spine after the intrathecal infusion of infusion. Coronal and sagittal reconstructions were obtained of the axial image sets. COMPARISON:  Lumbar spine radiographs 04/11/2016. MRI lumbar spine without and with contrast 07/26/2012. Lumbar myelogram 05/16/2011. FINDINGS: LUMBAR  MYELOGRAM FINDINGS: Progressive adjacent level disease is present at L to 3. There is further loss of disc height and endplate sclerotic changes well. Moderate central canal stenosis is also present at L1-2. The narrowing at L2-3 is worse with standing. There is no abnormal motion with flexion or extension. CT LUMBAR MYELOGRAM FINDINGS: The lumbar spine is imaged from the midbody of T12 through the midbody of S3. Five non rib-bearing lumbar type vertebral bodies are present. Conus medullaris terminates at  L1-2. Solid osseous fusion is present anteriorly and posteriorly at L3-4 and L4-5. The there significant lucency surrounding the right pedicle screw at S1 suggesting loosening. Gas is present in the disc space at L5-S1. Limited imaging of the abdomen demonstrates atherosclerosis of the aorta extending into the iliac artery is. No significant adenopathy is present. L1-2: A broad-based disc protrusion is similar to the prior study. There is progression of advanced facet hypertrophy. Prominent epidural fat contributes to crowding of the nerve roots. Mild foraminal narrowing is present bilaterally, right greater than left. L2-3: A broad-based disc protrusion has progressed. Advanced facet hypertrophy has progressed as well. The there is progressive central canal stenosis. Progressive severe foraminal stenosis is also evident, right greater than left. L3-4: Solid fusion is present. No residual recurrent stenosis is evident. L4-5: Solid fusion is present. A wide laminectomy is noted. There is no residual recurrent stenosis. L5-S1: A wide laminectomy is present. Severe foraminal stenosis is again noted bilaterally, right greater than left IMPRESSION: 1. Solid fusion at L3-4 and L4-5 without significant stenosis at these levels. 2. Progressive facet disease and disc protrusion at L2-3 with progressive moderate central canal stenosis and severe foraminal narrowing. 3. Progressive severe facet arthropathy and foraminal  stenosis at L5-S1 with nonunion at this level. 4. Epidural lipomatosis at L1-2 and L2-3 contributes to crowding of the nerve roots. Electronically Signed   By: San Morelle M.D.   On: 04/29/2016 14:09   Dg Myelography Lumbar Inj Lumbosacral  Result Date: 04/29/2016 CLINICAL DATA:  Progressive bilateral lower extremity weakness, right greater than left. EXAM: LUMBAR MYELOGRAM FLUOROSCOPY TIME:  Radiation Exposure Index (as provided by the fluoroscopic device): 703.05 uGy*m2 Fluoroscopy Time:  1 minutes 11 seconds Number of Acquired Images:  15 PROCEDURE: After thorough discussion of risks and benefits of the procedure including bleeding, infection, injury to nerves, blood vessels, adjacent structures as well as headache and CSF leak, written and oral informed consent was obtained. Consent was obtained by Dr. San Morelle. Time out form was completed. Patient was positioned prone on the fluoroscopy table. Local anesthesia was provided with 1% lidocaine without epinephrine after prepped and draped in the usual sterile fashion. Puncture was performed at L5 using a 3 1/2 inch 22-gauge spinal needle via midline approach. Using a single pass through the dura, the needle was placed within the thecal sac, with return of clear CSF. 15 mL of Isovue M 200 was injected into the thecal sac, with normal opacification of the nerve roots and cauda equina consistent with free flow within the subarachnoid space. I personally performed the lumbar puncture and administered the intrathecal contrast. I also personally supervised acquisition of the myelogram images. TECHNIQUE: Contiguous axial images were obtained through the Lumbar spine after the intrathecal infusion of infusion. Coronal and sagittal reconstructions were obtained of the axial image sets. COMPARISON:  Lumbar spine radiographs 04/11/2016. MRI lumbar spine without and with contrast 07/26/2012. Lumbar myelogram 05/16/2011. FINDINGS: LUMBAR MYELOGRAM  FINDINGS: Progressive adjacent level disease is present at L to 3. There is further loss of disc height and endplate sclerotic changes well. Moderate central canal stenosis is also present at L1-2. The narrowing at L2-3 is worse with standing. There is no abnormal motion with flexion or extension. CT LUMBAR MYELOGRAM FINDINGS: The lumbar spine is imaged from the midbody of T12 through the midbody of S3. Five non rib-bearing lumbar type vertebral bodies are present. Conus medullaris terminates at L1-2. Solid osseous fusion is present anteriorly and posteriorly at L3-4 and  L4-5. The there significant lucency surrounding the right pedicle screw at S1 suggesting loosening. Gas is present in the disc space at L5-S1. Limited imaging of the abdomen demonstrates atherosclerosis of the aorta extending into the iliac artery is. No significant adenopathy is present. L1-2: A broad-based disc protrusion is similar to the prior study. There is progression of advanced facet hypertrophy. Prominent epidural fat contributes to crowding of the nerve roots. Mild foraminal narrowing is present bilaterally, right greater than left. L2-3: A broad-based disc protrusion has progressed. Advanced facet hypertrophy has progressed as well. The there is progressive central canal stenosis. Progressive severe foraminal stenosis is also evident, right greater than left. L3-4: Solid fusion is present. No residual recurrent stenosis is evident. L4-5: Solid fusion is present. A wide laminectomy is noted. There is no residual recurrent stenosis. L5-S1: A wide laminectomy is present. Severe foraminal stenosis is again noted bilaterally, right greater than left IMPRESSION: 1. Solid fusion at L3-4 and L4-5 without significant stenosis at these levels. 2. Progressive facet disease and disc protrusion at L2-3 with progressive moderate central canal stenosis and severe foraminal narrowing. 3. Progressive severe facet arthropathy and foraminal stenosis at  L5-S1 with nonunion at this level. 4. Epidural lipomatosis at L1-2 and L2-3 contributes to crowding of the nerve roots. Electronically Signed   By: San Morelle M.D.   On: 04/29/2016 14:09   ASSESSMENT: Mr. Faries is a 61 year old male with a history of NSTEMI in 06-2015 status post DES to the RCA complicated by LV aneurysm and chronic systolic heart failure, diabetes, hypertension, hyperlipidemia, and chronic back pain status post spinal stimulator who presents with recurrent atypical chest pain which is different from his anginal equivalent and is reproduced with deep palpation. This most likely represents musculoskeletal chest pain.  PLAN/DISCUSSION: #) Chest pain - recurrent atypical chest pain which is different from his anginal equivalent and is reproduced with deep palpation. This most likely represents musculoskeletal chest pain. I have a low suspicion for acute coronary syndrome (particularly a type 4b myocardial infarction due to acute stent thrombosis), pulmonary embolus, or aortic dissection. Dx: - CBC, Chem 7 - CXR - EKG, Tele - Please obtain 2 sets of Trops at least 6 hours apart - Limited Echo for LVEF only - We discussed obtaining a Regadenosan (unable to exercise due to R leg weakess) SPECT stress test in the morning, less because I think he has acute coronary syndrome and more because I hope this will provide him and his wife with a measure of reassurance while facilitating the preoperative clearance he will require prior to spine surgery. Tx: - Continue home cardiac medications including Aspirin, Clopidogrel, and Atorvastatin - I would defer initiating either heparin or nitroglycerin drips as I do not believe this is ACS - Pain control (? Increase Tramadol dose)  #) If his stress test is without major inducible ischemia (as I suspect it will be), he may be discharged in the morning.  Allyson Sabal, MD Cardiology

## 2016-05-01 NOTE — Discharge Instructions (Signed)
Chest Wall Pain Chest wall pain is pain in or around the bones and muscles of your chest. Sometimes, an injury causes this pain. Sometimes, the cause may not be known. This pain may take several weeks or longer to get better. Follow these instructions at home: Pay attention to any changes in your symptoms. Take these actions to help with your pain:  Rest as told by your doctor.  Avoid activities that cause pain. Try not to use your chest, belly (abdominal), or side muscles to lift heavy things.  If directed, apply ice to the painful area:  Put ice in a plastic bag.  Place a towel between your skin and the bag.  Leave the ice on for 20 minutes, 2-3 times per day.  Take over-the-counter and prescription medicines only as told by your doctor.  Do not use tobacco products, including cigarettes, chewing tobacco, and e-cigarettes. If you need help quitting, ask your doctor.  Keep all follow-up visits as told by your doctor. This is important. Contact a doctor if:  You have a fever.  Your chest pain gets worse.  You have new symptoms. Get help right away if:  You feel sick to your stomach (nauseous) or you throw up (vomit).  You feel sweaty or light-headed.  You have a cough with phlegm (sputum) or you cough up blood.  You are short of breath. This information is not intended to replace advice given to you by your health care provider. Make sure you discuss any questions you have with your health care provider. Document Released: 07/30/2007 Document Revised: 07/19/2015 Document Reviewed: 05/08/2014 Elsevier Interactive Patient Education  2017 [employer].

## 2016-05-01 NOTE — Discharge Summary (Signed)
Physician Discharge Summary  Thomas Mcclure WIO:[id number] DOB: 12-08-1955 DOA: 04/30/2016  PCP: [name], MD  Admit date: 04/30/2016 Discharge date: 05/01/2016  Admitted From: Home Discharge disposition: Home   Recommendations for Outpatient Follow-Up:   1. F/U with cardiology in 1 week. 2. PCP: Please follow-up with hemoglobin A1c which was pending at the time of discharge. Needs optimization of diabetes control if elevated.   Discharge Diagnosis:   Principal Problem:    Chest pain, rule out acute myocardial infarction Active Problems:    History of non-ST elevation myocardial infarction (NSTEMI)    Hypertension    Hyperlipidemia with target LDL less than 100    CAD (coronary artery disease)    Hypokalemia    Diabetes mellitus type 2, controlled, with complications (HCC)    Obesity (BMI 30-39.9)   Discharge Condition: Stable.  Diet recommendation: Low sodium, heart healthy.  Carbohydrate-modified.  Regular.   History of Present Illness:   Thomas Mcclure is an 61 y.o. male with a history of NSTEMI in 5-2017status post DES to the RCA complicated by LV aneurysm and chronic systolic heart failure (EF 55-60 percent by echo on 12/31/15, but prior to this was 45-50 percent), diabetes, hypertension, hyperlipidemia, and chronic back pain status post spinal stimulator who was admitted 04/30/16 with recurrent atypical chest pain. He recently stopped and then restarted his Clopidogrel for a lumbar myelogram, and he became concerned that his stent may have occluded and as such took 2 nitroglycerin tabs with no effect and then presented to the emergency department. EKG showed nonspecific T-wave changes in V3. His first set of enzymes was negative.   Hospital Course by Problem:   Principal Problem:   Chest pain, rule out acute myocardial infarction/History of non-ST elevation MI/coronary artery disease Patient was admitted and monitored on telemetry. Evaluated by cardiologist  and underwent Lexiscan Which showed no reversible ischemia, old infarct/scar, and it EF of 44%. Per Dr. Marlou Porch: No further evaluation needed, follow-up with cardiologist as an outpatient. EKG showed diffuse ST changes with PR depression. Chest x-ray personally reviewed and showed cardiomegaly but no overt failure. Troponin was mildly elevated, but trend flat, and per cardiology notes, has chronic mild troponin elevation. Continue aspirin, Plavix, statin. I did increase his HCTZ to 25 mg daily given the reduction in his EF.   Active Problems:   Hypertension Continue hydrochlorothiazide, will increase the dose to 25 mg daily.    Diabetes mellitus ([place]), controlled with complications Managed with metformin. Follow-up hemoglobin A1c which was checked prior to discharge.Marland Kitchen  HIV screening The patient falls between the ages of 13-64 and should be screened for HIV, therefore HIV testing ordered. Nonreactive.  Obesity (BMI 30-39.9) Body mass index is 37.64 kg/m.   Hypokalemia Repleted intravenously since he was nothing by mouth for his stress test this morning.  Hyperlipidemia with target LDL less than 100 Continue Lipitor. Cholesterol 124 and LDL 68 on 10/04/15.    Medical Consultants:    Cardiology   Discharge Exam:   Vitals:   05/01/16 1024 05/01/16 1300  BP: 125/79 115/85  Pulse: 90 96  Resp:  (!) 24  Temp:  97.3 F (36.3 C)   Vitals:   05/01/16 1021 05/01/16 1023 05/01/16 1024 05/01/16 1300  BP: 126/88 128/85 125/79 115/85  Pulse: 94 96 90 96  Resp:    (!) 24  Temp:    97.3 F (36.3 C)  TempSrc:    Oral  SpO2:    94%  Weight:      Height:        General exam: Appears calm and comfortable.  Respiratory system: Clear to auscultation. Respiratory effort normal. Cardiovascular system: S1 & S2 heard, RRR. No JVD,  rubs, gallops or clicks. No murmurs. Gastrointestinal system: Abdomen is nondistended, soft and nontender. No organomegaly or masses felt. Normal bowel  sounds heard. Central nervous system: Alert and oriented. No focal neurological deficits. Extremities: No clubbing,  or cyanosis. No edema. Skin: No rashes, lesions or ulcers. Psychiatry: Judgement and insight appear normal. Mood & affect appropriate.    The results of significant diagnostics from this hospitalization (including imaging, microbiology, ancillary and laboratory) are listed below for reference.     Procedures and Diagnostic Studies:   Dg Chest 2 View  Result Date: 04/30/2016 CLINICAL DATA:  Central chest pain EXAM: CHEST  2 VIEW COMPARISON:  12/30/2015 FINDINGS: Atelectasis or scarring at the left lung base. No acute infiltrate or effusion. Stable mild cardiomegaly. No pneumothorax. Stimulator electrodes over the thoracic spine. IMPRESSION: Mild cardiomegaly without overt failure Electronically Signed   By: Donavan Foil M.D.   On: 04/30/2016 22:44   Nm Myocar Single W/planar W/wall Motion And Ef  Result Date: 05/01/2016 CLINICAL DATA:  Chest pain EXAM: MYOCARDIAL IMAGING WITH SPECT (REST AND PHARMACOLOGIC-STRESS) GATED LEFT VENTRICULAR WALL MOTION STUDY LEFT VENTRICULAR EJECTION FRACTION TECHNIQUE: Standard myocardial SPECT imaging was performed after resting intravenous injection of 10 mCi Tc-20m tetrofosmin. Subsequently, intravenous infusion of Lexiscan was performed under the supervision of the Cardiology staff. At peak effect of the drug, 30 mCi Tc-62m tetrofosmin was injected intravenously and standard myocardial SPECT imaging was performed. Quantitative gated imaging was also performed to evaluate left ventricular wall motion, and estimate left ventricular ejection fraction. COMPARISON:  None. FINDINGS: Perfusion: There is a fixed defect noted inferiorly on rest and stress images compatible with old infarct/ scar. No reversibility to suggest ischemia Wall Motion: Decreased wall motion and thickening inferiorly in the area of scar. Left Ventricular Ejection Fraction: 44 % End  diastolic volume 376 ml End systolic volume 62 ml IMPRESSION: 1. No reversible ischemia.  Old infarct/scar inferiorly. 2. Decreased wall motion and thickening inferiorly. 3. Left ventricular ejection fraction 44% 4. Non invasive risk stratification*: Intermediate *2012 Appropriate Use Criteria for Coronary Revascularization Focused Update: J Am Coll Cardiol. 2831;51(7):616-073. http://[email or web address] Electronically Signed   By: Rolm Baptise M.D.   On: 05/01/2016 16:10     Labs:   Basic Metabolic Panel:  Recent Labs Lab 04/30/16 2154  NA 139  K 3.1*  CL 102  CO2 27  GLUCOSE 160*  BUN 20  CREATININE 1.09  CALCIUM 9.0   GFR Estimated Creatinine Clearance: 90.4 mL/min (by C-G formula based on SCr of 1.09 mg/dL). Liver Function Tests: No results for input(s): AST, ALT, ALKPHOS, BILITOT, PROT, ALBUMIN in the last 168 hours. No results for input(s): LIPASE, AMYLASE in the last 168 hours. No results for input(s): AMMONIA in the last 168 hours. Coagulation profile No results for input(s): INR, PROTIME in the last 168 hours.  CBC:  Recent Labs Lab 04/30/16 2154  WBC 7.8  HGB 12.3*  HCT 37.3*  MCV 82.7  PLT 277   Cardiac Enzymes:  Recent Labs Lab 05/01/16 0119 05/01/16 0515 05/01/16 1156  TROPONINI 0.04* 0.05* 0.04*   BNP: Invalid input(s): POCBNP CBG:  Recent Labs Lab 05/01/16 0547 05/01/16 0831 05/01/16 1157 05/01/16 1633  GLUCAP 110* 98 178* 121*   D-Dimer  Recent Labs  04/30/16 2154  DDIMER 0.27   Hgb A1c No results for input(s): HGBA1C in the last 72 hours. Lipid Profile No results for input(s): CHOL, HDL, LDLCALC, TRIG, CHOLHDL, LDLDIRECT in the last 72 hours. Thyroid function studies No results for input(s): TSH, T4TOTAL, T3FREE, THYROIDAB in the last 72 hours.  Invalid input(s): FREET3 Anemia work up No results for input(s): VITAMINB12, FOLATE, FERRITIN, TIBC, IRON, RETICCTPCT in the last 72  hours. Microbiology No results found for this or any previous visit (from the past 240 hour(s)).   Discharge Instructions:   Discharge Instructions    (HEART FAILURE PATIENTS) Call MD:  Anytime you have any of the following symptoms: 1) 3 pound weight gain in 24 hours or 5 pounds in 1 week 2) shortness of breath, with or without a dry hacking cough 3) swelling in the hands, feet or stomach 4) if you have to sleep on extra pillows at night in order to breathe.    Complete by:  As directed    Call MD for:  difficulty breathing, headache or visual disturbances    Complete by:  As directed    Call MD for:  extreme fatigue    Complete by:  As directed    Call MD for:  persistant dizziness or light-headedness    Complete by:  As directed    Call MD for:  persistant nausea and vomiting    Complete by:  As directed    Diet - low sodium heart healthy    Complete by:  As directed    Diet Carb Modified    Complete by:  As directed    Discharge instructions    Complete by:  As directed    [facility] # IS [phone]   Increase activity slowly    Complete by:  As directed      Allergies as of 05/01/2016      Reactions   Brilinta [ticagrelor] Other (See Comments)   SOB and chest pain    Methylprednisolone Other (See Comments)   B/P dropped and diaphoresis   Prednisone Other (See Comments)   B/P dropped and patient began to sweat   Toradol [ketorolac Tromethamine] Other (See Comments)   bp dropped, diaphoresis   Adhesive [tape] Rash, Other (See Comments)   "blisters" ( NO SURGICAL TAPE, PLEASE)      Medication List    TAKE these medications   aspirin EC 81 MG tablet Take 1 tablet (81 mg total) by mouth daily.   atorvastatin 80 MG tablet Commonly known as:  LIPITOR Take 80 mg by mouth daily.   clopidogrel 75 MG tablet Commonly known as:  PLAVIX Take 1 tablet (75 mg total) by mouth daily.   diazepam 10 MG tablet Commonly known as:  VALIUM Take 10 mg by mouth 2  (two) times daily. For spasms   DULoxetine 60 MG capsule Commonly known as:  CYMBALTA Take 60 mg by mouth daily.   hydrochlorothiazide 25 MG tablet Commonly known as:  HYDRODIURIL Take 1 tablet (25 mg total) by mouth daily. What changed:  medication strength  how much to take   lisinopril 20 MG tablet Commonly known as:  PRINIVIL,ZESTRIL Take 1 tablet (20 mg total) by mouth daily.   metFORMIN 750 MG 24 hr tablet Commonly known as:  GLUCOPHAGE-XR Take 750 mg by mouth daily with breakfast.   multivitamin with minerals Tabs tablet Take 1 tablet by mouth daily.   nitroGLYCERIN 0.4 MG SL tablet Commonly known as:  NITROSTAT Place 1  tablet (0.4 mg total) under the tongue every 5 (five) minutes as needed for chest pain.   omeprazole 40 MG capsule Commonly known as:  PRILOSEC Take 40 mg by mouth daily.   Oxycodone HCl 10 MG Tabs Take 10 mg by mouth every 6 (six) hours. scheduled   polyethylene glycol powder powder Commonly known as:  GLYCOLAX/MIRALAX Take 17 g by mouth daily as needed (constipation). Mix in 8 oz water, juice, soda, coffee or tea and drink   pregabalin 100 MG capsule Commonly known as:  LYRICA Take 100 mg by mouth 2 (two) times daily.   topiramate 50 MG tablet Commonly known as:  TOPAMAX Take 50 mg by mouth 2 (two) times daily as needed (nerve pain/ leg cramps).   traMADol 50 MG tablet Commonly known as:  ULTRAM Take 50 mg by mouth 3 (three) times daily. scheduled      Follow-up Information    Sinclair Grooms, MD Follow up.   Specialty:  Cardiology Why:  office will call with time and date of appointment  Contact information: [address] [place] [address] [phone]            Time coordinating discharge: 30 minutes.  Signed:  Jaivyn Gulla  Pager [phone] [facility] 05/01/2016, 5:33 PM

## 2016-05-01 NOTE — Progress Notes (Signed)
Pt was seen by cardiology fellow overnight and was setup for myoview today. If myoview is normal, may be a possible discharge today.     Tami Lin Everhett Bozard PA-C

## 2016-05-01 NOTE — Progress Notes (Signed)
IMPRESSION: 1. No reversible ischemia.  Old infarct/scar inferiorly.  2. Decreased wall motion and thickening inferiorly.  3. Left ventricular ejection fraction 44%  4. Non invasive risk stratification*: Intermediate  Discussed with Dr.Skains. No further evaluation needed. Recent echo 12/2015 showed normal EF of 55-60% and grade 1 DD. No further cardiac work up needed this admission. Will sign off. Call with questions. Office will call with appointment.

## 2016-05-01 NOTE — Progress Notes (Signed)
Progress Note    Thomas Mcclure  SLH:[id number] DOB: [birth year]  DOA: 04/30/2016 PCP: [name], MD    Brief Narrative:   Chief complaint: Follow-up chest pain  Medical records reviewed and are as summarized below:  Thomas Mcclure is an 61 y.o. male with a history of NSTEMI in 06-2015 status post DES to the RCA complicated by LV aneurysm and chronic systolic heart failure (EF 55-60 percent by echo on 12/31/15, but prior to this was 45-50 percent), diabetes, hypertension, hyperlipidemia, and chronic back pain status post spinal stimulator who was admitted 04/30/16 with recurrent atypical chest pain. He recently stopped and then restarted his Clopidogrel for a lumbar myelogram, and he became concerned that his stent may have occluded  and as such took 2 nitroglycerin tabs with no effect and then presented to the emergency department. EKG showed nonspecific T-wave changes in V3. His first set of enzymes was negative.  Assessment/Plan:   Principal Problem:   Chest pain, rule out acute myocardial infarction/History of non-ST elevation MI/coronary artery disease Patient was admitted and monitored on telemetry. Evaluated by cardiologist and underwent Lexiscan. EKG showed diffuse ST changes with PR depression. Chest x-ray personally reviewed and showed cardiomegaly but no overt failure. Troponin is mildly elevated, but trend flat, and per cardiology notes, has chronic mild troponin elevation. Continue aspirin, Plavix, statin.   Active Problems:   Hypertension Continue hydrochlorothiazide.    Diabetes mellitus ([place]), controlled with complications Managed with metformin. Patient says he checks his blood sugars twice a week and that they are well-controlled. Hemoglobin A1c on 12/04/15 was 6.8%. Recheck hemoglobin A1c. Currently being managed with insulin sensitive SSI. Metformin on hold.  HIV screening The patient falls between the ages of 13-64 and should be screened for HIV, therefore HIV  testing ordered. Nonreactive.  Obesity (BMI 30-39.9) Body mass index is 37.64 kg/m.   Hypokalemia Repleted intravenously since he was nothing by mouth for his stress test this morning.  Hyperlipidemia with target LDL less than 100 Continue Lipitor. Cholesterol 124 and LDL 68 on 10/04/15.  Family Communication/Anticipated D/C date and plan/Code Status   DVT prophylaxis: Lovenox ordered. Code Status: Full Code.  Family Communication: No family at the bedside. Disposition Plan: Home today if stress test comes back negative.   Medical Consultants:    Cardiology   Procedures:    Lexiscan 05/01/16  Anti-Infectives:    None  Subjective:   Patient reports that he has had left-sided chest pain and reports that it was worse during his stress test. No nausea or diaphoresis.  Objective:    Vitals:   05/01/16 1021 05/01/16 1023 05/01/16 1024 05/01/16 1300  BP: 126/88 128/85 125/79 115/85  Pulse: 94 96 90 96  Resp:    (!) 24  Temp:    97.3 F (36.3 C)  TempSrc:    Oral  SpO2:    94%  Weight:      Height:        Intake/Output Summary (Last 24 hours) at 05/01/16 1601 Last data filed at 05/01/16 1546  Gross per 24 hour  Intake              265 ml  Output             1400 ml  Net            -1135 ml   Filed Weights   05/01/16 0500  Weight: 115.6 kg (254 lb 14.4 oz)  Exam: General exam: Appears calm and comfortable.  Respiratory system: Clear to auscultation. Respiratory effort normal. Cardiovascular system: S1 & S2 heard, RRR. No JVD,  rubs, gallops or clicks. No murmurs. Gastrointestinal system: Abdomen is nondistended, soft and nontender. No organomegaly or masses felt. Normal bowel sounds heard. Central nervous system: Alert and oriented. No focal neurological deficits. Extremities: No clubbing,  or cyanosis. No edema. Skin: No rashes, lesions or ulcers. Psychiatry: Judgement and insight appear normal. Mood & affect appropriate.   Data Reviewed:   I  have personally reviewed following labs and imaging studies:  Labs: Basic Metabolic Panel:  Recent Labs Lab 04/30/16 2154  NA 139  K 3.1*  CL 102  CO2 27  GLUCOSE 160*  BUN 20  CREATININE 1.09  CALCIUM 9.0   GFR Estimated Creatinine Clearance: 90.4 mL/min (by C-G formula based on SCr of 1.09 mg/dL).  CBC:  Recent Labs Lab 04/30/16 2154  WBC 7.8  HGB 12.3*  HCT 37.3*  MCV 82.7  PLT 277   Cardiac Enzymes:  Recent Labs Lab 05/01/16 0119 05/01/16 0515 05/01/16 1156  TROPONINI 0.04* 0.05* 0.04*   CBG:  Recent Labs Lab 05/01/16 0547 05/01/16 0831 05/01/16 1157  GLUCAP 110* 98 178*   D-Dimer:  Recent Labs  04/30/16 2154  DDIMER 0.27    Microbiology No results found for this or any previous visit (from the past 240 hour(s)).  Radiology: Dg Chest 2 View  Result Date: 04/30/2016 CLINICAL DATA:  Central chest pain EXAM: CHEST  2 VIEW COMPARISON:  12/30/2015 FINDINGS: Atelectasis or scarring at the left lung base. No acute infiltrate or effusion. Stable mild cardiomegaly. No pneumothorax. Stimulator electrodes over the thoracic spine. IMPRESSION: Mild cardiomegaly without overt failure Electronically Signed   By: Donavan Foil M.D.   On: 04/30/2016 22:44    Medications:   . aspirin EC  81 mg Oral Daily  . atorvastatin  80 mg Oral Daily  . clopidogrel  75 mg Oral Daily  . diazepam  10 mg Oral BID  . DULoxetine  60 mg Oral Daily  . enoxaparin (LOVENOX) injection  40 mg Subcutaneous Daily  . hydrochlorothiazide  12.5 mg Oral Daily  . insulin aspart  0-9 Units Subcutaneous Q4H  . lisinopril  20 mg Oral Daily  . multivitamin with minerals  1 tablet Oral Daily  . oxyCODONE  10 mg Oral Q6H  . pantoprazole  80 mg Oral Daily  . [START ON 05/02/2016] pneumococcal 23 valent vaccine  0.5 mL Intramuscular Tomorrow-1000  . pregabalin  100 mg Oral BID  . traMADol  50 mg Oral TID   Continuous Infusions:  Medical decision making is of high complexity and this  patient is at high risk of deterioration, therefore this is a level 3 visit.  (> 4 problem points, >4 data points, high risk)  Patient was admitted after per night, so no charge   LOS: 0 days   RAMA,CHRISTINA  [facility] Pager [phone]. If unable to reach me by pager, please call my cell phone at [phone].  *Please refer to [email or web address], password [email or web address] to get updated schedule on who will round on this patient, as hospitalists switch teams weekly. If 7PM-7AM, please contact night-coverage at [email or web address], password [email or web address] for any overnight needs.  05/01/2016, 4:00 PM

## 2016-05-01 NOTE — Progress Notes (Addendum)
Patient presented for Lexiscan. Tolerated procedure well. Pending final stress imaging result.   The patient states that he has been wheezing x 2 weeks. No fever or chills. EKG with diffuse ST changes with PR depression. MD to review. R/o pericarditis. Pain worse with movement. Also has chronic back pain. Primary team to evaluate for wheezing. CXR clear. ? MSK.   Personally seen and examined.   Feels better. Still has some nagging pain in axilla. Mild constant.  Trop mildly elevated 0.04.   Vital signs personally reviewed.  Lungs now clear, Heart RRR, Alert in NAD, sitting up eating.   Chest pain  - awaiting results of NUC  - if low risk, ok to go home.   - atypical discomfort.   HTN  - stable, meds reviewed  Demand ischemia  - low level trop    Candee Furbish, MD

## 2016-05-02 ENCOUNTER — Telehealth: Payer: Self-pay | Admitting: Interventional Cardiology

## 2016-05-02 NOTE — Telephone Encounter (Signed)
New message   Pt needs to know if he needs to go back on potassium since it was low in hospital

## 2016-05-02 NOTE — Telephone Encounter (Signed)
Patient's wife calling wondering if patient needs to be on Potassium since it was low when he was in the hospital. Informed patient's wife that patient was given IV potassium for low potassium, and that patient would need a repeat BMET to see if his potassium is still low. Informed patient that lab work could be done at the next office visit on 05/13/16. Informed patient's wife that message would be sent to [name] to see if he wants to make any changes before than.

## 2016-05-03 NOTE — Telephone Encounter (Signed)
Yes, start K-dur 20 meq daily. BMET at [place]

## 2016-05-05 NOTE — Telephone Encounter (Signed)
Left message to call back  

## 2016-05-06 MED ORDER — POTASSIUM CHLORIDE CRYS ER 20 MEQ PO TBCR: 20.0000 meq | EXTENDED_RELEASE_TABLET | Freq: Every day | ORAL | 10 refills | Status: DC

## 2016-05-06 NOTE — Telephone Encounter (Signed)
Called patient about Dr. Thompson Caul recommendation. Informed patient to start taking Potassium daily, sent to patient's pharmacy. Informed patient that [name] will check his lab work on his office visit on 05/13/16. Patient verbalized understanding.

## 2016-05-12 NOTE — Progress Notes (Signed)
CARDIOLOGY OFFICE NOTE  Date:  05/13/2016    Thomas Mcclure Date of Birth: 1956-01-23 Medical Record #[id number]  PCP:  [name], MD  Cardiologist:  [name]    Chief Complaint  Patient presents with  . Coronary Artery Disease    Post hospital visit - seen for [name]    History of Present Illness: Thomas Mcclure is a 61 y.o. male who presents today for a post hospital visit. Seen for [name].  Last seen here in December of 2017.   He has a history of NSTEMI in 5-2017status post DES to the RCA complicated by LV aneurysm and chronic systolic heart SKAJGOT(LX72-62 percent by echo on 12/31/15, but prior to this was 45-50 percent), diabetes, hypertension, hyperlipidemia, and chronic back pain status post spinal stimulator. His last cath was in June of 2017 - see below - has continued with medical therapy. He has been noted to have chronically elevated troponin levels.   Syncopal spell last year - medicines were reduced down. Not clear to me why he is not on beta blocker therapy.   Admitted 3/7/18with recurrent atypical chest pain. He recently stopped and then restarted his Clopidogrel for a lumbar myelogram, and he became concerned that his stent may have occluded and as such took 2 nitroglycerin tabs with no effect and then presented to the emergency department. EKG showed nonspecific T-wave changes in V3. His first set of enzymes was negative.  He was admitted and monitored on telemetry. He underwent Lexiscan which showed no reversible ischemia, old infarct/scar, and EF of 44%. Per Dr. Marlou Porch: No further evaluation needed, follow-up with cardiologist as an outpatient.   Comes in today. Here with his wife - Thomas Mcclure. He has not eaten today nor taken his medicines. Has been started on potassium replacement. Having hot flashes and nightsweats. No actual fever. Weight seems to fluctuate. Seeing PCP next week. He has not had any more chest pain. Not short of breath. No swelling. He  is happy with how he is doing. He has no concerns today.   Past Medical History:  Diagnosis Date  . Arthritis    Rheumatoid  . Baker's cyst    Left calf  . CAD in native artery, 07/15/15 PCI of RCA with DES 07/16/2015   a.   NSTEMI 5/17: LHC - pLAD 20, pLCx 20, OM1 30, pRCA 100, EF 45-50%>> PCI: 2.5 x 24 mm Promus DES to RCA  //  b.   Echo 5/17: mild LVH, EF 50-55%, no RWMA, mod RVE //  c. LHC 6/17: pLAD 20, pLCx 20, OM1 50, pRCA stent ok, EF 35-45% with mod sized inf wall and basal segment aneurysm  . Chronic back pain   . Cyst (solitary) of breast    ? cyst left calf ,knee  . Diabetes mellitus    diet controlled  . GERD (gastroesophageal reflux disease)   . Hyperlipidemia   . Hypertension    Dr. [name] [phone]  . Ischemic cardiomyopathy    a. LV-gram at time of LHC in 6/17 with EF 35-45%  //  b. Echo 7/17: EF 45-50%, inferior HK, grade 1 diastolic dysfunction, mildly dilated aortic root, moderately reduced RVSF, mild RAE  . Myocardial infarction 2017  . 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 CARDIAC CATHETERIZATION N/A 07/15/2015   Procedure: Left Heart Cath and Coronary Angiography;  Surgeon: [name],  MD;  Location: [facility];  Service: Cardiovascular;  Laterality: N/A;  . CARDIAC CATHETERIZATION N/A 07/15/2015   Procedure: Coronary Stent Intervention;  Surgeon: [name], MD;  Location: [facility];  Service: Cardiovascular;  Laterality: N/A;  . CARDIAC CATHETERIZATION N/A 08/07/2015   Procedure: Left Heart Cath and Coronary Angiography;  Surgeon: [name], MD;  Location: [facility];  Service: Cardiovascular;  Laterality: N/A;  . CORONARY STENT PLACEMENT  07/2015  . KNEE SURGERY    . LUMBAR SPINE SURGERY  03/2009  & 2012  . MASS EXCISION N/A 04/19/2012   Procedure: removal of posterior cervical lipoma;  Surgeon: [name], MD;  Location: [facility];   Service: Neurosurgery;  Laterality: N/A;  Removal of posterior cervical lipoma  . SPINAL CORD STIMULATOR INSERTION N/A 01/07/2013   Procedure:  SPINAL CORD STIMULATOR INSERTION;  Surgeon: [name], MD;  Location: [facility];  Service: Neurosurgery;  Laterality: N/A;  . WISDOM TOOTH EXTRACTION     hx of     Medications: Current Outpatient Prescriptions  Medication Sig Dispense Refill  . aspirin EC 81 MG tablet Take 1 tablet (81 mg total) by mouth daily. 30 tablet 1  . atorvastatin (LIPITOR) 80 MG tablet Take 80 mg by mouth daily.    . chlorthalidone (HYGROTON) 25 MG tablet one tablet daily  2  . clopidogrel (PLAVIX) 75 MG tablet Take 1 tablet (75 mg total) by mouth daily. 90 tablet 2  . cyclobenzaprine (FLEXERIL) 10 MG tablet as needed for muscle spasms  2  . diazepam (VALIUM) 10 MG tablet Take 10 mg by mouth 2 (two) times daily. For spasms    . DILT-XR 180 MG 24 hr capsule one tablet daily  4  . DULoxetine (CYMBALTA) 60 MG capsule Take 60 mg by mouth daily.    . hydrochlorothiazide (HYDRODIURIL) 25 MG tablet Take 1 tablet (25 mg total) by mouth daily. 30 tablet 2  . metFORMIN (GLUCOPHAGE-XR) 750 MG 24 hr tablet Take 750 mg by mouth daily with breakfast.     . Multiple Vitamin (MULTIVITAMIN WITH MINERALS) TABS tablet Take 1 tablet by mouth daily.    . nitroGLYCERIN (NITROSTAT) 0.4 MG SL tablet Place 1 tablet (0.4 mg total) under the tongue every 5 (five) minutes as needed for chest pain. 25 tablet 6  . omeprazole (PRILOSEC) 40 MG capsule Take 40 mg by mouth daily.    . Oxycodone HCl 10 MG TABS Take 10 mg by mouth every 6 (six) hours. scheduled    . polyethylene glycol powder (GLYCOLAX/MIRALAX) powder Take 17 g by mouth daily as needed (constipation). Mix in 8 oz water, juice, soda, coffee or tea and drink    . potassium chloride SA (K-DUR,KLOR-CON) 20 MEQ tablet Take 1 tablet (20 mEq total) by mouth daily. 30 tablet 10  . pregabalin (LYRICA) 100 MG capsule Take 100 mg by mouth 2  (two) times daily.     Marland Kitchen topiramate (TOPAMAX) 50 MG tablet Take 50 mg by mouth 2 (two) times daily as needed (nerve pain/ leg cramps).     . traMADol (ULTRAM) 50 MG tablet Take 50 mg by mouth 3 (three) times daily. scheduled    . lisinopril (PRINIVIL,ZESTRIL) 20 MG tablet Take 1 tablet (20 mg total) by mouth daily. 90 tablet 3   No current facility-administered medications for this visit.     Allergies: Allergies  Allergen Reactions  . Brilinta [Ticagrelor] Other (See Comments)  SOB and chest pain   . Methylprednisolone Other (See Comments)    B/P dropped and diaphoresis  . Prednisone Other (See Comments)    B/P dropped and patient began to sweat  . Toradol [Ketorolac Tromethamine] Other (See Comments)    bp dropped, diaphoresis  . Adhesive [Tape] Rash and Other (See Comments)    "blisters" ( NO SURGICAL TAPE, PLEASE)    Social History: The patient  reports that he has quit smoking. His smoking use included Cigarettes. He quit after 15.00 years of use. He quit smokeless tobacco use about 13 months ago. He reports that he drinks alcohol. He reports that he does not use drugs.   Family History: The patient's family history includes Heart disease in his mother; Prostate cancer in his brother.   Review of Systems: Please see the history of present illness.   Otherwise, the review of systems is positive for none.   All other systems are reviewed and negative.   Physical Exam: VS:  BP 110/72   Pulse 84   Ht 6' (1.829 m)   Wt 242 lb 1.9 oz (109.8 kg)   SpO2 93% Comment: at rest  BMI 32.84 kg/m  .  BMI Body mass index is 32.84 kg/m.  Wt Readings from Last 3 Encounters:  05/13/16 242 lb 1.9 oz (109.8 kg)  05/01/16 254 lb 14.4 oz (115.6 kg)  02/13/16 235 lb (106.6 kg)    General: Pleasant. Obese. Alert and in no acute distress. Limps. Using a cane.   HEENT: Normal.  Neck: Supple, no JVD, carotid bruits, or masses noted.  Cardiac: Regular rate and rhythm. No murmurs, rubs,  or gallops. No edema.  Respiratory:  Lungs are clear to auscultation bilaterally with normal work of breathing.  GI: Soft and nontender.  MS: No deformity or atrophy. Gait and ROM intact.  Skin: Warm and dry. Color is normal.  Neuro:  Strength and sensation are intact and no gross focal deficits noted.  Psych: Alert, appropriate and with normal affect.   LABORATORY DATA:  EKG:  EKG is not ordered today.  Lab Results  Component Value Date   WBC 7.8 04/30/2016   HGB 12.3 (L) 04/30/2016   HCT 37.3 (L) 04/30/2016   PLT 277 04/30/2016   GLUCOSE 160 (H) 04/30/2016   CHOL 124 (L) 10/04/2015   TRIG 85 10/04/2015   HDL 39 (L) 10/04/2015   LDLCALC 68 10/04/2015   ALT 20 12/31/2015   AST 24 12/31/2015   NA 139 04/30/2016   K 3.1 (L) 04/30/2016   CL 102 04/30/2016   CREATININE 1.09 04/30/2016   BUN 20 04/30/2016   CO2 27 04/30/2016   TSH 0.354 12/30/2015   PSA 0.37 08/31/2009   INR 0.99 12/03/2015   HGBA1C 6.8 (H) 12/04/2015   MICROALBUR 0.50 08/07/2009    BNP (last 3 results)  Recent Labs  12/03/15 1450 12/30/15 1533  BNP 54.7 29.4    ProBNP (last 3 results) No results for input(s): PROBNP in the last 8760 hours.   Other Studies Reviewed Today:  MYOVIEW FINDINGS 04/2016: Perfusion: There is a fixed defect noted inferiorly on rest and stress images compatible with old infarct/ scar. No reversibility to suggest ischemia  Wall Motion: Decreased wall motion and thickening inferiorly in the area of scar.  Left Ventricular Ejection Fraction: 44 %  End diastolic volume 503 ml  End systolic volume 62 ml  IMPRESSION: 1. No reversible ischemia.  Old infarct/scar inferiorly.  2. Decreased wall motion and  thickening inferiorly.  3. Left ventricular ejection fraction 44%  4. Non invasive risk stratification*: Intermediate  *2012 Appropriate Use Criteria for Coronary Revascularization Focused Update: J Am Coll Cardiol.  1610;96(0):454-098. http://content.airportbarriers.com.aspx?articleid=1201161   Electronically Signed   By: Rolm Baptise M.D.   On: 05/01/2016 16:10  Echo Study Conclusions from 12/2015  - Left ventricle: The cavity size was normal. There was mild   concentric hypertrophy. Systolic function was normal. The   estimated ejection fraction was in the range of 55% to 60%. There   is akinesis of the basal-midinferior myocardium. Doppler   parameters are consistent with abnormal left ventricular   relaxation (grade 1 diastolic dysfunction).   Cardiac Cath Conclusion from 07/2015  1. Prox LAD to Mid LAD lesion, 20% stenosed. 2. Prox Cx to Mid Cx lesion, 20% stenosed. 3. Ost 1st Mrg to 1st Mrg lesion, 50% stenosed.    Widely patent right coronary artery including recently placed proximal stent at the time of acute coronary intervention.  Irregularities noted in the proximal and mid LAD as well as circumflex. There is up to 50% stenosis in the proximal segment of the large first obtuse marginal.  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 Overall LV function is moderately decreased with an EF of 35-40%. LVEDP 23 mmHg.  No obvious explanation for the patient's ongoing chest pain complaints.     Assessment/Plan: 1. Chest pain - has known CAD with prior PCI - recent admission for chest pain and has had low risk Myoview - symptoms now resolved. Would favor continued medical management. Not on beta blocker therapy - not bradycardic - BP now on the low side of normal - will follow for now.   2. HTN - HCTZ has been increased. BP looks great.   3. Diastolic dysfunction with prior systolic dysfunction - EF now normal by last echo but at 48% per recent Myoview - would favor continued medical therapy - he is on ACE. Consider beta blocker on return.   4. Hypokalemia - needs rechecking today  5. HLD - on statin therapy - checking labs today  6. Prior syncope - had medicines  reduced in the past - no recurrence.   Current medicines are reviewed with the patient today.  The patient does not have concerns regarding medicines other than what has been noted above.  The following changes have been made:  See above.  Labs/ tests ordered today include:    Orders Placed This Encounter  Procedures  . Basic metabolic panel  . Hepatic function panel  . Lipid panel     Disposition:   FU with [name] in 3 to 4 months.   Patient is agreeable to this plan and will call if any problems develop in the interim.   SignedTruitt Merle, NP  05/13/2016 8:22 AM  [facility] [address] [place], [place]  [address] Phone: [phone] Fax: [phone]

## 2016-05-13 ENCOUNTER — Encounter (INDEPENDENT_AMBULATORY_CARE_PROVIDER_SITE_OTHER): Payer: Self-pay

## 2016-05-13 ENCOUNTER — Encounter: Payer: Self-pay | Admitting: Nurse Practitioner

## 2016-05-13 ENCOUNTER — Ambulatory Visit (INDEPENDENT_AMBULATORY_CARE_PROVIDER_SITE_OTHER): Payer: Medicare Other | Admitting: Nurse Practitioner

## 2016-05-13 VITALS — BP 110/72 | HR 84 | Ht 72.0 in | Wt 242.1 lb

## 2016-05-13 DIAGNOSIS — I10 Essential (primary) hypertension: Secondary | ICD-10-CM | POA: Diagnosis not present

## 2016-05-13 DIAGNOSIS — I255 Ischemic cardiomyopathy: Secondary | ICD-10-CM | POA: Diagnosis not present

## 2016-05-13 DIAGNOSIS — I2510 Atherosclerotic heart disease of native coronary artery without angina pectoris: Secondary | ICD-10-CM

## 2016-05-13 DIAGNOSIS — E876 Hypokalemia: Secondary | ICD-10-CM | POA: Diagnosis not present

## 2016-05-13 LAB — BASIC METABOLIC PANEL
BUN/Creatinine Ratio: 11 (ref 10–24)
BUN: 11 mg/dL (ref 8–27)
CO2: 23 mmol/L (ref 18–29)
Calcium: 9.9 mg/dL (ref 8.6–10.2)
Chloride: 99 mmol/L (ref 96–106)
Creatinine, Ser: 1.04 mg/dL (ref 0.76–1.27)
GFR calc Af Amer: 90 mL/min/{1.73_m2} (ref >59)
GFR calc non Af Amer: 78 mL/min/{1.73_m2} (ref >59)
Glucose: 127 mg/dL — ABNORMAL HIGH (ref 65–99)
Potassium: 3.9 mmol/L (ref 3.5–5.2)
Sodium: 141 mmol/L (ref 134–144)

## 2016-05-13 LAB — LIPID PANEL
Chol/HDL Ratio: 3.7 ratio units (ref 0.0–5.0)
Cholesterol, Total: 118 mg/dL (ref 100–199)
HDL: 32 mg/dL — ABNORMAL LOW (ref >39)
LDL Calculated: 65 mg/dL (ref 0–99)
Triglycerides: 107 mg/dL (ref 0–149)
VLDL Cholesterol Cal: 21 mg/dL (ref 5–40)

## 2016-05-13 LAB — HEPATIC FUNCTION PANEL
ALT: 27 IU/L (ref 0–44)
AST: 25 IU/L (ref 0–40)
Albumin: 4.8 g/dL (ref 3.6–4.8)
Alkaline Phosphatase: 84 IU/L (ref 39–117)
Bilirubin Total: 0.6 mg/dL (ref 0.0–1.2)
Bilirubin, Direct: 0.18 mg/dL (ref 0.00–0.40)
Total Protein: 7.2 g/dL (ref 6.0–8.5)

## 2016-05-13 NOTE — Patient Instructions (Addendum)
We will be checking the following labs today - BMET, HPF and lipids   Medication Instructions:    Continue with your current medicines.     Testing/Procedures To Be Arranged:  N/A  Follow-Up:   See [name] in about 3 to 4 months    Other Special Instructions:   N/A    If you need a refill on your cardiac medications before your next appointment, please call your pharmacy.   Call the [place] office at [phone] if you have any questions, problems or concerns.

## 2016-05-27 ENCOUNTER — Other Ambulatory Visit: Payer: Self-pay | Admitting: *Deleted

## 2016-05-27 MED ORDER — CHLORTHALIDONE 25 MG PO TABS: 25.0000 mg | ORAL_TABLET | Freq: Every day | ORAL | 2 refills | Status: DC

## 2016-07-23 ENCOUNTER — Telehealth: Payer: Self-pay | Admitting: Interventional Cardiology

## 2016-07-23 NOTE — Telephone Encounter (Signed)
Spoke with wife, DPR and advised that clearance form was obtained and signed by [name] late yesterday. Advised I have placed in fax box in medical records for it to be sent over today.  Wife appreciative for call.

## 2016-07-23 NOTE — Telephone Encounter (Signed)
New message    Pt wife is calling for pt to find out if clearance was received from [facility].

## 2016-08-12 ENCOUNTER — Other Ambulatory Visit: Payer: Self-pay | Admitting: Interventional Cardiology

## 2016-08-12 ENCOUNTER — Other Ambulatory Visit: Payer: Self-pay | Admitting: Physician Assistant

## 2016-08-13 ENCOUNTER — Other Ambulatory Visit: Payer: Self-pay | Admitting: Interventional Cardiology

## 2016-08-13 MED ORDER — NITROGLYCERIN 0.4 MG SL SUBL: 0.4000 mg | SUBLINGUAL_TABLET | SUBLINGUAL | 4 refills | Status: DC | PRN

## 2016-08-19 NOTE — Progress Notes (Signed)
Cardiology Office Note    Date:  08/21/2016   ID:  Thomas Mcclure, Thomas Mcclure 1955-05-29, MRN [id number]  PCP:  [name], MD  Cardiologist: Sinclair Grooms, MD   Chief Complaint  Patient presents with  . Coronary Artery Disease    History of Present Illness:  Thomas Mcclure is a 61 y.o. male history of NSTEMI in 5-2017status post DES to the RCA complicated by LV aneurysm and chronic systolic heart [id number](AV69-79 percent by echo on 12/31/15, but prior to this was 45-50 percent), diabetes, hypertension, hyperlipidemia, and chronic back pain status post spinal stimulator.   No cardiac complaints. Denies chest pain. No dyspnea. Limited by musculoskeletal discomfort. No medication side effects that he can tell.  Past Medical History:  Diagnosis Date  . Arthritis    Rheumatoid  . Baker's cyst    Left calf  . CAD in native artery, 07/15/15 PCI of RCA with DES 07/16/2015   a.   NSTEMI 5/17: LHC - pLAD 20, pLCx 20, OM1 30, pRCA 100, EF 45-50%>> PCI: 2.5 x 24 mm Promus DES to RCA  //  b.   Echo 5/17: mild LVH, EF 50-55%, no RWMA, mod RVE //  c. LHC 6/17: pLAD 20, pLCx 20, OM1 50, pRCA stent ok, EF 35-45% with mod sized inf wall and basal segment aneurysm  . Chronic back pain   . Cyst (solitary) of breast    ? cyst left calf ,knee  . Diabetes mellitus    diet controlled  . GERD (gastroesophageal reflux disease)   . Hyperlipidemia   . Hypertension    Dr. [name] [phone]  . Ischemic cardiomyopathy    a. LV-gram at time of LHC in 6/17 with EF 35-45%  //  b. Echo 7/17: EF 45-50%, inferior HK, grade 1 diastolic dysfunction, mildly dilated aortic root, moderately reduced RVSF, mild RAE  . Myocardial infarction ([place]) 2017  . 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 CARDIAC CATHETERIZATION N/A 07/15/2015   Procedure: Left Heart Cath and Coronary Angiography;  Surgeon: [name], MD;  Location: [facility];  Service: Cardiovascular;  Laterality: N/A;  . CARDIAC CATHETERIZATION N/A 07/15/2015   Procedure: Coronary Stent Intervention;  Surgeon: [name], MD;  Location: [facility];  Service: Cardiovascular;  Laterality: N/A;  . CARDIAC CATHETERIZATION N/A 08/07/2015   Procedure: Left Heart Cath and Coronary Angiography;  Surgeon: [name], MD;  Location: [facility];  Service: Cardiovascular;  Laterality: N/A;  . CORONARY STENT PLACEMENT  07/2015  . KNEE SURGERY    . LUMBAR SPINE SURGERY  03/2009  & 2012  . MASS EXCISION N/A 04/19/2012   Procedure: removal of posterior cervical lipoma;  Surgeon: [name], MD;  Location: [facility];  Service: Neurosurgery;  Laterality: N/A;  Removal of posterior cervical lipoma  . SPINAL CORD STIMULATOR INSERTION N/A 01/07/2013   Procedure:  SPINAL CORD STIMULATOR INSERTION;  Surgeon: [name], MD;  Location: [facility];  Service: Neurosurgery;  Laterality: N/A;  . WISDOM TOOTH EXTRACTION     hx of    Current Medications: Outpatient Medications Prior to Visit  Medication Sig Dispense Refill  . aspirin EC 81 MG tablet Take 1 tablet (81 mg total) by mouth daily. 30 tablet 1  . atorvastatin (LIPITOR) 80 MG tablet TAKE 1 TABLET BY MOUTH EVERY DAY  AT 6PM 30 tablet 8  . chlorthalidone (HYGROTON) 25 MG tablet Take 1 tablet (25 mg total) by mouth daily. 90 tablet 2  . cyclobenzaprine (FLEXERIL) 10 MG tablet as needed for muscle spasms  2  . diazepam (VALIUM) 10 MG tablet Take 10 mg by mouth 2 (two) times daily. For spasms    . DILT-XR 180 MG 24 hr capsule one tablet daily  4  . DULoxetine (CYMBALTA) 60 MG capsule Take 60 mg by mouth daily.    . metFORMIN (GLUCOPHAGE-XR) 750 MG 24 hr tablet Take 750 mg by mouth daily with breakfast.     . Multiple Vitamin (MULTIVITAMIN WITH MINERALS) TABS tablet Take 1 tablet by mouth daily.    Marland Kitchen omeprazole (PRILOSEC) 40 MG capsule Take 40 mg by mouth daily.    . Oxycodone HCl 10 MG TABS  Take 10 mg by mouth every 6 (six) hours. scheduled    . polyethylene glycol powder (GLYCOLAX/MIRALAX) powder Take 17 g by mouth daily as needed (constipation). Mix in 8 oz water, juice, soda, coffee or tea and drink    . pregabalin (LYRICA) 100 MG capsule Take 100 mg by mouth 2 (two) times daily.     Marland Kitchen topiramate (TOPAMAX) 50 MG tablet Take 50 mg by mouth 2 (two) times daily as needed (nerve pain/ leg cramps).     . traMADol (ULTRAM) 50 MG tablet Take 50 mg by mouth 3 (three) times daily. scheduled    . clopidogrel (PLAVIX) 75 MG tablet Take 1 tablet (75 mg total) by mouth daily. 90 tablet 2  . hydrochlorothiazide (HYDRODIURIL) 25 MG tablet Take 1 tablet (25 mg total) by mouth daily. 30 tablet 2  . nitroGLYCERIN (NITROSTAT) 0.4 MG SL tablet Place 1 tablet (0.4 mg total) under the tongue every 5 (five) minutes as needed for chest pain. 25 tablet 4  . potassium chloride SA (K-DUR,KLOR-CON) 20 MEQ tablet Take 1 tablet (20 mEq total) by mouth daily. 30 tablet 10  . lisinopril (PRINIVIL,ZESTRIL) 20 MG tablet Take 1 tablet (20 mg total) by mouth daily. 90 tablet 3   No facility-administered medications prior to visit.      Allergies:   Brilinta [ticagrelor]; Methylprednisolone; Prednisone; Toradol [ketorolac tromethamine]; and Adhesive [tape]   Social History   Social History  . Marital status: Married    Spouse name: N/A  . Number of children: 5  . Years of education: N/A   Occupational History  . 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 Smoking status: Former Smoker    Years: 15.00    Types: Cigarettes  . Smokeless tobacco: Former Systems developer    Quit date: 04/08/2015     Comment: 5 cig a week  . Alcohol use 0.0 oz/week     Comment: Couple 40 ounces daily until 2012  . Drug use: No  . Sexual activity: Not Asked   Other Topics Concern  . None   Social History Narrative  . None     Family History:  The patient's family history  includes Heart disease in his mother; Prostate cancer in his brother.   ROS:   Please see the history of present illness.    Back and hip discomfort. Plans hip replacement surgery by Dr. French Ana in July.  All other systems reviewed and are negative.   PHYSICAL EXAM:   VS:  BP 100/72 (BP Location: Left Arm)   Pulse 78   Ht 5'  10" (1.778 m)   Wt 251 lb 9.6 oz (114.1 kg)   BMI 36.10 kg/m    GEN: Well nourished, well developed, in no acute distress  HEENT: normal  Neck: no JVD, carotid bruits, or masses Cardiac: RRR; no murmurs, rubs, or gallops,no edema  Respiratory:  clear to auscultation bilaterally, normal work of breathing GI: soft, nontender, nondistended, + BS MS: no deformity or atrophy  Skin: warm and dry, no rash Neuro:  Alert and Oriented x 3, Strength and sensation are intact Psych: euthymic mood, full affect  Wt Readings from Last 3 Encounters:  08/21/16 251 lb 9.6 oz (114.1 kg)  05/13/16 242 lb 1.9 oz (109.8 kg)  05/01/16 254 lb 14.4 oz (115.6 kg)      Studies/Labs Reviewed:   EKG:  EKG  Not repeated  Recent Labs: 12/30/2015: B Natriuretic Peptide 29.4; Magnesium 1.7; TSH 0.354 04/30/2016: Hemoglobin 12.3; Platelets 277 05/13/2016: ALT 27; BUN 11; Creatinine, Ser 1.04; Potassium 3.9; Sodium 141   Lipid Panel    Component Value Date/Time   CHOL 118 05/13/2016 0831   TRIG 107 05/13/2016 0831   HDL 32 (L) 05/13/2016 0831   CHOLHDL 3.7 05/13/2016 0831   CHOLHDL 3.2 10/04/2015 1018   VLDL 17 10/04/2015 1018   LDLCALC 65 05/13/2016 0831    Additional studies/ records that were reviewed today include:  No new data    ASSESSMENT:    1. Coronary artery disease involving native coronary artery of native heart without angina pectoris   2. Left ventricular aneurysm   3. Ischemic cardiomyopathy   4. Controlled type 2 diabetes mellitus with complication, without long-term current use of insulin ([place])   5. Hyperlipidemia with target LDL less than 100       PLAN:  In order of problems listed above:  1. Stable cardiac status without angina. We'll continue Plavix up until one week prior to hip surgery and then discontinue the medication permanently. He should continue taking an aspirin daily. 2. No arrhythmia or palpitations. 3. LVEF is back in the normal range based on repeat echo. 4. Being managed by primary care. 5. LDL target is less than or equal to 70.  Clinical follow-up in 9-12 months. Discontinue Plavix. Call if cardiac complaints.    Medication Adjustments/Labs and Tests Ordered: Current medicines are reviewed at length with the patient today.  Concerns regarding medicines are outlined above.  Medication changes, Labs and Tests ordered today are listed in the Patient Instructions below. Patient Instructions  Medication Instructions:  1) Once you stop your Plavix for your upcoming surgery, you do not have to restart it after surgery.  Labwork: None  Testing/Procedures: None  Follow-Up: Your physician wants you to follow-up in: 9 months with [name].  You will receive a reminder letter in the mail two months in advance. If you don't receive a letter, please call our office to schedule the follow-up appointment.   Any Other Special Instructions Will Be Listed Below (If Applicable).     If you need a refill on your cardiac medications before your next appointment, please call your pharmacy.      Signed, Sinclair Grooms, MD  08/21/2016 9:15 AM    [facility] [place], [place], [place]  [address] Phone: [phone]; Fax: [phone]

## 2016-08-21 ENCOUNTER — Other Ambulatory Visit: Payer: Self-pay | Admitting: Interventional Cardiology

## 2016-08-21 ENCOUNTER — Encounter: Payer: Self-pay | Admitting: Interventional Cardiology

## 2016-08-21 ENCOUNTER — Ambulatory Visit (INDEPENDENT_AMBULATORY_CARE_PROVIDER_SITE_OTHER): Payer: Medicare Other | Admitting: Interventional Cardiology

## 2016-08-21 VITALS — BP 100/72 | HR 78 | Ht 70.0 in | Wt 251.6 lb

## 2016-08-21 DIAGNOSIS — I2510 Atherosclerotic heart disease of native coronary artery without angina pectoris: Secondary | ICD-10-CM

## 2016-08-21 DIAGNOSIS — I253 Aneurysm of heart: Secondary | ICD-10-CM | POA: Diagnosis not present

## 2016-08-21 DIAGNOSIS — I255 Ischemic cardiomyopathy: Secondary | ICD-10-CM | POA: Diagnosis not present

## 2016-08-21 DIAGNOSIS — E118 Type 2 diabetes mellitus with unspecified complications: Secondary | ICD-10-CM | POA: Diagnosis not present

## 2016-08-21 DIAGNOSIS — E785 Hyperlipidemia, unspecified: Secondary | ICD-10-CM

## 2016-08-21 MED ORDER — NITROGLYCERIN 0.4 MG SL SUBL: 0.4000 mg | SUBLINGUAL_TABLET | SUBLINGUAL | 3 refills | Status: DC | PRN

## 2016-08-21 MED ORDER — POTASSIUM CHLORIDE CRYS ER 20 MEQ PO TBCR: 20.0000 meq | EXTENDED_RELEASE_TABLET | Freq: Every day | ORAL | 3 refills | Status: DC

## 2016-08-21 MED ORDER — HYDROCHLOROTHIAZIDE 25 MG PO TABS: 25.0000 mg | ORAL_TABLET | Freq: Every day | ORAL | 3 refills | Status: DC

## 2016-08-21 MED ORDER — CLOPIDOGREL BISULFATE 75 MG PO TABS: 75.0000 mg | ORAL_TABLET | Freq: Every day | ORAL | 3 refills | Status: DC

## 2016-08-21 NOTE — Patient Instructions (Addendum)
Medication Instructions:  1) Once you stop your Plavix for your upcoming surgery, you do not have to restart it after surgery.  Labwork: None  Testing/Procedures: None  Follow-Up: Your physician wants you to follow-up in: 9 months with [name].  You will receive a reminder letter in the mail two months in advance. If you don't receive a letter, please call our office to schedule the follow-up appointment.   Any Other Special Instructions Will Be Listed Below (If Applicable).     If you need a refill on your cardiac medications before your next appointment, please call your pharmacy.

## 2016-09-12 ENCOUNTER — Encounter (HOSPITAL_COMMUNITY): Payer: Self-pay | Admitting: Emergency Medicine

## 2016-09-12 ENCOUNTER — Emergency Department (HOSPITAL_COMMUNITY)
Admission: EM | Admit: 2016-09-12 | Discharge: 2016-09-12 | Disposition: A | Discharge status: Home / Self Care | Payer: Medicare Other | Attending: Emergency Medicine | Admitting: Emergency Medicine

## 2016-09-12 DIAGNOSIS — Z79899 Other long term (current) drug therapy: Secondary | ICD-10-CM | POA: Insufficient documentation

## 2016-09-12 DIAGNOSIS — T63461A Toxic effect of venom of wasps, accidental (unintentional), initial encounter: Secondary | ICD-10-CM | POA: Insufficient documentation

## 2016-09-12 DIAGNOSIS — Z7982 Long term (current) use of aspirin: Secondary | ICD-10-CM | POA: Insufficient documentation

## 2016-09-12 DIAGNOSIS — Y929 Unspecified place or not applicable: Secondary | ICD-10-CM | POA: Insufficient documentation

## 2016-09-12 DIAGNOSIS — F17210 Nicotine dependence, cigarettes, uncomplicated: Secondary | ICD-10-CM | POA: Insufficient documentation

## 2016-09-12 DIAGNOSIS — I252 Old myocardial infarction: Secondary | ICD-10-CM | POA: Insufficient documentation

## 2016-09-12 DIAGNOSIS — E119 Type 2 diabetes mellitus without complications: Secondary | ICD-10-CM | POA: Diagnosis not present

## 2016-09-12 DIAGNOSIS — Z7984 Long term (current) use of oral hypoglycemic drugs: Secondary | ICD-10-CM | POA: Diagnosis not present

## 2016-09-12 DIAGNOSIS — I2510 Atherosclerotic heart disease of native coronary artery without angina pectoris: Secondary | ICD-10-CM | POA: Diagnosis not present

## 2016-09-12 DIAGNOSIS — Z7902 Long term (current) use of antithrombotics/antiplatelets: Secondary | ICD-10-CM | POA: Insufficient documentation

## 2016-09-12 DIAGNOSIS — Y93H2 Activity, gardening and landscaping: Secondary | ICD-10-CM | POA: Diagnosis not present

## 2016-09-12 DIAGNOSIS — I10 Essential (primary) hypertension: Secondary | ICD-10-CM | POA: Diagnosis not present

## 2016-09-12 DIAGNOSIS — Y999 Unspecified external cause status: Secondary | ICD-10-CM | POA: Diagnosis not present

## 2016-09-12 DIAGNOSIS — T63451A Toxic effect of venom of hornets, accidental (unintentional), initial encounter: Secondary | ICD-10-CM

## 2016-09-12 DIAGNOSIS — T63441A Toxic effect of venom of bees, accidental (unintentional), initial encounter: Secondary | ICD-10-CM

## 2016-09-12 MED ORDER — TRIAMCINOLONE ACETONIDE 0.5 % EX OINT: 1.0000 "application " | TOPICAL_OINTMENT | Freq: Two times a day (BID) | CUTANEOUS | 0 refills | Status: DC

## 2016-09-12 MED ORDER — FAMOTIDINE 20 MG PO TABS
20.0000 mg | ORAL_TABLET | Freq: Once | ORAL | Status: AC
  Administered 2016-09-12: 20 mg via ORAL
  Filled 2016-09-12: qty 1

## 2016-09-12 MED ORDER — DIPHENHYDRAMINE HCL 25 MG PO CAPS
25.0000 mg | ORAL_CAPSULE | Freq: Four times a day (QID) | ORAL | Status: DC | PRN
  Administered 2016-09-12: 25 mg via ORAL
  Filled 2016-09-12: qty 1

## 2016-09-12 MED ORDER — OXYCODONE HCL 5 MG PO TABS
5.0000 mg | ORAL_TABLET | Freq: Once | ORAL | Status: AC
  Administered 2016-09-12: 5 mg via ORAL
  Filled 2016-09-12: qty 1

## 2016-09-12 MED ORDER — HYDROXYZINE HCL 25 MG PO TABS: 25.0000 mg | ORAL_TABLET | Freq: Four times a day (QID) | ORAL | 0 refills | Status: DC

## 2016-09-12 MED ORDER — FAMOTIDINE 20 MG PO TABS: 20.0000 mg | ORAL_TABLET | Freq: Two times a day (BID) | ORAL | 0 refills | Status: DC

## 2016-09-12 MED ORDER — CIPROFLOXACIN HCL 500 MG PO TABS: 500.0000 mg | ORAL_TABLET | Freq: Two times a day (BID) | ORAL | 0 refills | Status: DC

## 2016-09-12 NOTE — Discharge Instructions (Signed)
Continue to use ice on your hands. Elevated your hands. Take the medication as directed. Return for significantly worsening swelling or extension well past the area that we marked. Also return for wheezing, shortness of breath.  Contact a health care provider if: Your symptoms do not get better in 2-3 days. You have redness, swelling, or pain that spreads beyond the area of the sting. You have a fever. Get help right away if: You have symptoms of a severe allergic reaction. These include: Wheezing or difficulty breathing. Tightness in the chest or chest pain. Light-headedness or fainting. Itchy, raised, red patches on the skin. Nausea or vomiting. Abdominal cramping. Diarrhea. A swollen tongue or lips, or trouble swallowing. Dizziness or fainting.

## 2016-09-12 NOTE — ED Notes (Signed)
Pt ambulated to restroom with aide of cane. Steady gait and tolerated well.

## 2016-09-12 NOTE — ED Triage Notes (Signed)
Pt reports out in the yard working yesterday afternoon and got into a insect nest. Pt reports they were following him and stinging him repeatedly.

## 2016-09-12 NOTE — ED Provider Notes (Signed)
[facility] DEPT Provider Note   CSN: [id number] Arrival date & time: 09/12/16  7017     History   Chief Complaint Chief Complaint  Patient presents with  . Insect Bite    HPI Thomas Mcclure is a 61 y.o. male who presents to the ED for c/o wasp stings.  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 He was stung multiple times on both hands. He has continued to have painful swelling and itching. He has used benadryl with minimal relief. He states that he was up all night because of the pain. He denies numbness, tingling, wheezing, coughing, nausea, vomiting, or other airway reaction.      Past Medical History:  Diagnosis Date  . Arthritis    Rheumatoid  . Baker's cyst    Left calf  . CAD in native artery, 07/15/15 PCI of RCA with DES 07/16/2015   a.   NSTEMI 5/17: LHC - pLAD 20, pLCx 20, OM1 30, pRCA 100, EF 45-50%>> PCI: 2.5 x 24 mm Promus DES to RCA  //  b.   Echo 5/17: mild LVH, EF 50-55%, no RWMA, mod RVE //  c. LHC 6/17: pLAD 20, pLCx 20, OM1 50, pRCA stent ok, EF 35-45% with mod sized inf wall and basal segment aneurysm  . Chronic back pain   . Cyst (solitary) of breast    ? cyst left calf ,knee  . Diabetes mellitus    diet controlled  . GERD (gastroesophageal reflux disease)   . Hyperlipidemia   . Hypertension    Dr. [name] [phone]  . Ischemic cardiomyopathy    a. LV-gram at time of LHC in 6/17 with EF 35-45%  //  b. Echo 7/17: EF 45-50%, inferior HK, grade 1 diastolic dysfunction, mildly dilated aortic root, moderately reduced RVSF, mild RAE  . Myocardial infarction ([place]) 2017  . 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 Chest pain, rule out acute myocardial infarction 05/01/2016  . Diabetes mellitus type 2, controlled, with complications ([place]) 33/00/7622  . Obesity (BMI 30-39.9) 05/01/2016  . Left ventricular aneurysm  12/03/2015  . Ischemic cardiomyopathy   . Unstable angina ([place]) 08/06/2015  . CAD (coronary artery disease) 07/16/2015  . Hypokalemia 07/16/2015  . History of non-ST elevation myocardial infarction (NSTEMI) 07/15/2015  . Hypertension 07/15/2015  . Hyperlipidemia with target LDL less than 100 07/15/2015  . Chronic back pain 07/15/2015  . GERD (gastroesophageal reflux disease) 06/18/2012  . Chronic radicular low back pain 06/18/2012    Past Surgical History:  Procedure Laterality Date  . BACK SURGERY    . CARDIAC CATHETERIZATION N/A 07/15/2015   Procedure: Left Heart Cath and Coronary Angiography;  Surgeon: [name], MD;  Location: [facility];  Service: Cardiovascular;  Laterality: N/A;  . CARDIAC CATHETERIZATION N/A 07/15/2015   Procedure: Coronary Stent Intervention;  Surgeon: [name], MD;  Location: [facility];  Service: Cardiovascular;  Laterality: N/A;  . CARDIAC CATHETERIZATION N/A 08/07/2015   Procedure: Left Heart Cath and Coronary Angiography;  Surgeon: [name], MD;  Location: [facility];  Service: Cardiovascular;  Laterality: N/A;  . CORONARY STENT PLACEMENT  07/2015  . KNEE SURGERY    . LUMBAR SPINE SURGERY  03/2009  & 2012  . MASS EXCISION N/A 04/19/2012   Procedure: removal of posterior cervical lipoma;  Surgeon: [name], MD;  Location: [facility];  Service: Neurosurgery;  Laterality: N/A;  Removal of posterior cervical lipoma  . SPINAL CORD STIMULATOR INSERTION N/A 01/07/2013   Procedure:  SPINAL CORD STIMULATOR INSERTION;  Surgeon: [name], MD;  Location: [facility];  Service: Neurosurgery;  Laterality: N/A;  . WISDOM TOOTH EXTRACTION     hx of       Home Medications    Prior to Admission medications   Medication Sig Start Date End Date Taking? Authorizing Provider  aspirin EC 81 MG tablet Take 1 tablet (81 mg total) by mouth daily. 07/18/15   [facility], Arloa Koh, MD  atorvastatin (LIPITOR) 80 MG tablet TAKE 1  TABLET BY MOUTH EVERY DAY AT 6PM 08/13/16   [name], MD  chlorthalidone (HYGROTON) 25 MG tablet Take 1 tablet (25 mg total) by mouth daily. 05/27/16   Burtis Junes, NP  clopidogrel (PLAVIX) 75 MG tablet Take 1 tablet (75 mg total) by mouth daily. 08/21/16   [name], MD  cyclobenzaprine (FLEXERIL) 10 MG tablet as needed for muscle spasms 03/12/16   [provider]  diazepam (VALIUM) 10 MG tablet Take 10 mg by mouth 2 (two) times daily. For spasms    [provider]  DILT-XR 180 MG 24 hr capsule one tablet daily 04/09/16   [provider]  DULoxetine (CYMBALTA) 60 MG capsule Take 60 mg by mouth daily.    [provider]  hydrochlorothiazide (HYDRODIURIL) 25 MG tablet Take 1 tablet (25 mg total) by mouth daily. 08/21/16   [name], MD  lisinopril (PRINIVIL,ZESTRIL) 20 MG tablet Take 20 mg by mouth daily. 07/07/16   [provider]  metFORMIN (GLUCOPHAGE-XR) 750 MG 24 hr tablet Take 750 mg by mouth daily with breakfast.  06/07/15   [provider]  Multiple Vitamin (MULTIVITAMIN WITH MINERALS) TABS tablet Take 1 tablet by mouth daily.    [provider]  nitroGLYCERIN (NITROSTAT) 0.4 MG SL tablet PLACE 1 TABLET UNDER THE TONGUE EVERY 5 MINUTES AS NEEDED FOR CHEST PAIN. CALL 911 AT THIRD DOSE WITHIN 15 MINUTES 08/22/16   [name], MD  omeprazole (PRILOSEC) 40 MG capsule Take 40 mg by mouth daily.    [provider]  Oxycodone HCl 10 MG TABS Take 10 mg by mouth every 6 (six) hours. scheduled    [provider]  polyethylene glycol powder (GLYCOLAX/MIRALAX) powder Take 17 g by mouth daily as needed (constipation). Mix in 8 oz water, juice, soda, coffee or tea and drink 06/07/15   [provider]  potassium chloride SA (K-DUR,KLOR-CON) 20 MEQ tablet Take 1 tablet (20 mEq total) by mouth daily. 08/21/16   [name], MD  pregabalin (LYRICA) 100 MG capsule Take 100 mg by mouth 2 (two) times daily.      [provider]  topiramate (TOPAMAX) 50 MG tablet Take 50 mg by mouth 2 (two) times daily as needed (nerve pain/ leg cramps).     [provider]  traMADol (ULTRAM) 50 MG tablet Take 50 mg by mouth 3 (three) times daily. scheduled    [provider]    Family History Family History  Problem Relation Age of Onset  . Heart disease Mother   . Prostate cancer Brother     Social History Social History  Substance Use Topics  . Smoking status: Former Smoker    Years: 15.00    Types: Cigarettes  . Smokeless tobacco:  Former Systems developer    Quit date: 04/08/2015     Comment: 5 cig a week  . Alcohol use 0.0 oz/week     Comment: Couple 40 ounces daily until 2012     Allergies   Brilinta [ticagrelor]; Methylprednisolone; Prednisone; Toradol [ketorolac tromethamine]; and Adhesive [tape]   Review of Systems Review of Systems  Ten systems reviewed and are negative for acute change, except as noted in the HPI.   Physical Exam Updated Vital Signs BP (!) 148/95   Pulse 77   Temp (!) 96.1 F (35.6 C) (Oral)   Resp 18   Ht 5\' 10"  (1.778 m)   Wt 108.9 kg (240 lb)   SpO2 96%   BMI 34.44 kg/m   Physical Exam  Constitutional: He appears well-developed and well-nourished. No distress.  HENT:  Head: Normocephalic and atraumatic.  Eyes: Pupils are equal, round, and reactive to light. Conjunctivae and EOM are normal. No scleral icterus.  Neck: Normal range of motion. Neck supple.  Cardiovascular: Normal rate, regular rhythm and normal heart sounds.   Pulmonary/Chest: Effort normal and breath sounds normal. No respiratory distress.  Abdominal: Soft. There is no tenderness.  Musculoskeletal: He exhibits no edema.  Bilateral hands markedly edematous, erythematous, exquisitely tender. Range of motion is limited due to the amount of swelling. Swelling extends to the proximal forearm. No evidence of infection. NVI  Neurological: He is alert.  Skin: Skin is warm and dry.  He is not diaphoretic.  Psychiatric: His behavior is normal.  Nursing note and vitals reviewed.    ED Treatments / Results  Labs (all labs ordered are listed, but only abnormal results are displayed) Labs Reviewed - No data to display  EKG  EKG Interpretation None       Radiology No results found.  Procedures Procedures (including critical care time)  Medications Ordered in ED Medications - No data to display   Initial Impression / Assessment and Plan / ED Course  I have reviewed the triage vital signs and the nursing notes.  Pertinent labs & imaging results that were available during my care of the patient were reviewed by me and considered in my medical decision making (see chart for details).      patient with localized reaction to wasp stings of the hands. No evidence of anaphylaxis.  Given the stings were through rubber gloves I will cover the patient for pseudomonal infection with Cipro. The patient is unable to take systemic steroids.  Will given atarax, steroid ointment, and pepcid.  Strict return precautions given. F/u wit pcp  Final Clinical Impressions(s) / ED Diagnoses   Final diagnoses:  None    New Prescriptions New Prescriptions   No medications on file     Margarita Mail, PA-C 09/12/16 [id number]    Fatima Blank, MD 09/12/16 [phone]

## 2016-09-17 ENCOUNTER — Ambulatory Visit: Payer: Self-pay | Admitting: Physician Assistant

## 2016-09-17 NOTE — H&P (Signed)
TOTAL HIP ADMISSION H&P  Patient is admitted for right total hip arthroplasty.  Subjective:  Chief Complaint: right hip pain  HPI: Thomas Mcclure, 61 y.o. male, has a history of pain and functional disability in the right hip(s) due to arthritis and patient has failed non-surgical conservative treatments for greater than 12 weeks to include NSAID's and/or analgesics, corticosteriod injections, use of assistive devices and activity modification.  Onset of symptoms was gradual starting 3 years ago with gradually worsening course since that time.The patient noted no past surgery on the right hip(s).  Patient currently rates pain in the right hip at 8 out of 10 with activity. Patient has night pain, worsening of pain with activity and weight bearing, trendelenberg gait, pain that interfers with activities of daily living and pain with passive range of motion. Patient has evidence of periarticular osteophytes and joint space narrowing by imaging studies. This condition presents safety issues increasing the risk of falls.  There is no current active infection.  Patient Active Problem List   Diagnosis Date Noted  . Chest pain, rule out acute myocardial infarction 05/01/2016  . Diabetes mellitus type 2, controlled, with complications ([place]) 40/98/1191  . Obesity (BMI 30-39.9) 05/01/2016  . Left ventricular aneurysm 12/03/2015  . Ischemic cardiomyopathy   . Unstable angina ([place]) 08/06/2015  . CAD (coronary artery disease) 07/16/2015  . Hypokalemia 07/16/2015  . History of non-ST elevation myocardial infarction (NSTEMI) 07/15/2015  . Hypertension 07/15/2015  . Hyperlipidemia with target LDL less than 100 07/15/2015  . Chronic back pain 07/15/2015  . GERD (gastroesophageal reflux disease) 06/18/2012  . Chronic radicular low back pain 06/18/2012   Past Medical History:  Diagnosis Date  . Arthritis    Rheumatoid  . Baker's cyst    Left calf  . CAD in native artery, 07/15/15 PCI of RCA with DES  07/16/2015   a.   NSTEMI 5/17: LHC - pLAD 20, pLCx 20, OM1 30, pRCA 100, EF 45-50%>> PCI: 2.5 x 24 mm Promus DES to RCA  //  b.   Echo 5/17: mild LVH, EF 50-55%, no RWMA, mod RVE //  c. LHC 6/17: pLAD 20, pLCx 20, OM1 50, pRCA stent ok, EF 35-45% with mod sized inf wall and basal segment aneurysm  . Chronic back pain   . Cyst (solitary) of breast    ? cyst left calf ,knee  . Diabetes mellitus    diet controlled  . GERD (gastroesophageal reflux disease)   . Hyperlipidemia   . Hypertension    Dr. [name] [phone]  . Ischemic cardiomyopathy    a. LV-gram at time of LHC in 6/17 with EF 35-45%  //  b. Echo 7/17: EF 45-50%, inferior HK, grade 1 diastolic dysfunction, mildly dilated aortic root, moderately reduced RVSF, mild RAE  . Myocardial infarction ([place]) 2017  . 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 BACK SURGERY    . CARDIAC CATHETERIZATION N/A 07/15/2015   Procedure: Left Heart Cath and Coronary Angiography;  Surgeon: [name], MD;  Location: [facility];  Service: Cardiovascular;  Laterality: N/A;  . CARDIAC CATHETERIZATION N/A 07/15/2015   Procedure: Coronary Stent Intervention;  Surgeon: [name], MD;  Location: [facility];  Service: Cardiovascular;  Laterality: N/A;  . CARDIAC CATHETERIZATION N/A 08/07/2015   Procedure: Left Heart Cath and Coronary Angiography;  Surgeon: [name], MD;  Location: [facility];  Service: Cardiovascular;  Laterality: N/A;  . CORONARY STENT PLACEMENT  07/2015  . KNEE SURGERY    . LUMBAR SPINE SURGERY  03/2009  & 2012  . MASS EXCISION N/A 04/19/2012   Procedure: removal of posterior cervical lipoma;  Surgeon: [name], MD;  Location: [facility];  Service: Neurosurgery;  Laterality: N/A;  Removal of posterior cervical lipoma  . SPINAL CORD STIMULATOR INSERTION N/A 01/07/2013   Procedure:  SPINAL CORD STIMULATOR INSERTION;  Surgeon: [name], MD;  Location: [facility];  Service: Neurosurgery;  Laterality: N/A;  . WISDOM TOOTH EXTRACTION     hx of     (Not in a hospital admission) Allergies  Allergen Reactions  . Brilinta [Ticagrelor] Other (See Comments)    SOB and chest pain   . Methylprednisolone Other (See Comments)    B/P dropped and diaphoresis  . Prednisone Other (See Comments)    B/P dropped and patient began to sweat  . Toradol [Ketorolac Tromethamine] Other (See Comments)    bp dropped, diaphoresis  . Adhesive [Tape] Rash and Other (See Comments)    "blisters" ( NO SURGICAL TAPE, PLEASE)    Social History  Substance Use Topics  . Smoking status: Former Smoker    Years: 15.00    Types: Cigarettes  . Smokeless tobacco: Former Systems developer    Quit date: 04/08/2015     Comment: 5 cig a week  . Alcohol use 0.0 oz/week     Comment: Couple 40 ounces daily until 2012    Family History  Problem Relation Age of Onset  . Heart disease Mother   . Prostate cancer Brother      Review of Systems  Genitourinary: Positive for frequency.  Musculoskeletal: Positive for joint pain.  All other systems reviewed and are negative.   Objective:  Physical Exam  Constitutional: He is oriented to person, place, and time. He appears well-developed and well-nourished. No distress.  HENT:  Head: Normocephalic and atraumatic.  Nose: Nose normal.  Eyes: Pupils are equal, round, and reactive to light. Conjunctivae and EOM are normal.  Neck: Normal range of motion. Neck supple.  Cardiovascular: Normal rate, regular rhythm, normal heart sounds and intact distal pulses.   Respiratory: Effort normal and breath sounds normal. No respiratory distress. He has no wheezes.  GI: Soft. Bowel sounds are normal. He exhibits no distension. There is no tenderness.  Musculoskeletal:       Right hip: He exhibits decreased range of motion and tenderness.  Lymphadenopathy:    He has no cervical adenopathy.  Neurological: He is alert and  oriented to person, place, and time. No cranial nerve deficit.  Skin: Skin is warm and dry. No rash noted. No erythema.  Psychiatric: He has a normal mood and affect. His behavior is normal.    Vital signs in last 24 hours: @VSRANGES @  Labs:   Estimated body mass index is 34.44 kg/m as calculated from the following:   Height as of 09/12/16: 5\' 10"  (1.778 m).   Weight as of 09/12/16: 108.9 kg (240 lb).   Imaging Review Plain radiographs demonstrate severe degenerative joint disease of the right hip(s). The bone quality appears to be good for age and reported activity level.  Assessment/Plan:  End stage arthritis, right hip(s)  The patient history, physical examination, clinical judgement of the provider and imaging studies are consistent with end stage degenerative joint disease of the right hip(s) and total hip arthroplasty is deemed medically  necessary. The treatment options including medical management, injection therapy, arthroscopy and arthroplasty were discussed at length. The risks and benefits of total hip arthroplasty were presented and reviewed. The risks due to aseptic loosening, infection, stiffness, dislocation/subluxation,  thromboembolic complications and other imponderables were discussed.  The patient acknowledged the explanation, agreed to proceed with the plan and consent was signed. Patient is being admitted for inpatient treatment for surgery, pain control, PT, OT, prophylactic antibiotics, VTE prophylaxis, progressive ambulation and ADL's and discharge planning.The patient is planning to be discharged home with home health services

## 2016-09-19 NOTE — Pre-Procedure Instructions (Signed)
Thomas Mcclure  09/19/2016      [place], [facility] [address] [place] [address] Phone: [phone] Fax: [phone]  [facility] - [place], [place] AT [address] [place] [address] Phone: [phone] Fax: [phone]    Your procedure is scheduled on Friday August 10.  Report to [facility] Admitting at 5:30 A.M.  Call this number if you have problems the morning of surgery:  [phone]   Remember:  Do not eat food or drink liquids after midnight.    Take these medicines the morning of surgery with A SIP OF WATER: diazepam (valium) if needed, DILT-XR, duloxetine (cymbalta), famotidine (pepcid), omeprazole (prilosec), pregabalin (lyrica), tramadol (ultram) if needed or oxycodone if needed, flexeril if needed  7 days prior to surgery STOP taking any Aspirin, Aleve, Naproxen, Ibuprofen, Motrin, Advil, Goody's, BC's, all herbal medications, fish oil, and all vitamins  FOLLOW MD's instructions on stopping Plavix  WHAT DO I DO ABOUT MY DIABETES MEDICATION?   Marland Kitchen Do not take oral diabetes medicines (pills) the morning of surgery. DO NOT TAKE metformin (glucophage) the day of surgery.  Continue taking all other medications as prescribed by physician until the day before surgery.         How to Manage Your Diabetes Before and After Surgery  Why is it important to control my blood sugar before and after surgery? . Improving blood sugar levels before and after surgery helps healing and can limit problems. . A way of improving blood sugar control is eating a healthy diet by: o  Eating less sugar and carbohydrates o  Increasing activity/exercise o  Talking with your doctor about reaching your blood sugar goals . High blood sugars (greater than 180 mg/dL) can raise your risk of infections and slow your  recovery, so you will need to focus on controlling your diabetes during the weeks before surgery. . Make sure that the doctor who takes care of your diabetes knows about your planned surgery including the date and location.  How do I manage my blood sugar before surgery? . Check your blood sugar at least 4 times a day, starting 2 days before surgery, to make sure that the level is not too high or low. o Check your blood sugar the morning of your surgery when you wake up and every 2 hours until you get to the Short Stay unit. . If your blood sugar is less than 70 mg/dL, you will need to treat for low blood sugar: o Do not take insulin. o Treat a low blood sugar (less than 70 mg/dL) with  cup of clear juice (cranberry or apple), 4 glucose tablets, OR glucose gel. o Recheck blood sugar in 15 minutes after treatment (to make sure it is greater than 70 mg/dL). If your blood sugar is not greater than 70 mg/dL on recheck, call [phone] for further instructions. . Report your blood sugar to the short stay nurse when you get to Short Stay.  . If you are admitted to the hospital after surgery: o Your blood sugar will be checked by the staff and you will probably be given insulin after surgery (instead of oral diabetes medicines) to make sure you have good blood sugar levels. o The goal for blood sugar control after surgery is 80-180 mg/dL.  Do not wear jewelry  Do not wear lotions, powders, or colognes, or deoderant.   Men may shave face and neck.  Do not bring valuables to the hospital.  [facility] is not responsible for any belongings or valuables.  Contacts, dentures or bridgework may not be worn into surgery.  Leave your suitcase in the car.  After surgery it may be brought to your room.  For patients admitted to the hospital, discharge time will be determined by your treatment team.  Patients discharged the day of surgery will not be allowed to drive home.     Special instructions:    [name]- Preparing For Surgery  Before surgery, you can play an important role. Because skin is not sterile, your skin needs to be as free of germs as possible. You can reduce the number of germs on your skin by washing with CHG (chlorahexidine gluconate) Soap before surgery.  CHG is an antiseptic cleaner which kills germs and bonds with the skin to continue killing germs even after washing.  Please do not use if you have an allergy to CHG or antibacterial soaps. If your skin becomes reddened/irritated stop using the CHG.  Do not shave (including legs and underarms) for at least 48 hours prior to first CHG shower. It is OK to shave your face.  Please follow these instructions carefully.   1. Shower the NIGHT BEFORE SURGERY and the MORNING OF SURGERY with CHG.   2. If you chose to wash your hair, wash your hair first as usual with your normal shampoo.  3. After you shampoo, rinse your hair and body thoroughly to remove the shampoo.  4. Use CHG as you would any other liquid soap. You can apply CHG directly to the skin and wash gently with a scrungie or a clean washcloth.   5. Apply the CHG Soap to your body ONLY FROM THE NECK DOWN.  Do not use on open wounds or open sores. Avoid contact with your eyes, ears, mouth and genitals (private parts). Wash genitals (private parts) with your normal soap.  6. Wash thoroughly, paying special attention to the area where your surgery will be performed.  7. Thoroughly rinse your body with warm water from the neck down.  8. DO NOT shower/wash with your normal soap after using and rinsing off the CHG Soap.  9. Pat yourself dry with a CLEAN TOWEL.   10. Wear CLEAN PAJAMAS   11. Place CLEAN SHEETS on your bed the night of your first shower and DO NOT SLEEP WITH PETS.    Day of Surgery: Do not apply any deodorants/lotions. Please wear clean clothes to the hospital/surgery center.      Please read over the  following fact sheets that you were given. Total Joint Packet and MRSA Information

## 2016-09-22 ENCOUNTER — Encounter (HOSPITAL_COMMUNITY)
Admission: RE | Admit: 2016-09-22 | Discharge: 2016-09-22 | Disposition: A | Discharge status: Home / Self Care | Payer: Medicare Other | Source: Ambulatory Visit | Attending: Physician Assistant | Admitting: Physician Assistant

## 2016-09-22 ENCOUNTER — Encounter (HOSPITAL_COMMUNITY)
Admission: RE | Admit: 2016-09-22 | Discharge: 2016-09-22 | Disposition: A | Discharge status: Home / Self Care | Payer: Medicare Other | Source: Ambulatory Visit | Attending: Orthopedic Surgery | Admitting: Orthopedic Surgery

## 2016-09-22 ENCOUNTER — Encounter (HOSPITAL_COMMUNITY): Payer: Self-pay

## 2016-09-22 DIAGNOSIS — Z01818 Encounter for other preprocedural examination: Secondary | ICD-10-CM | POA: Insufficient documentation

## 2016-09-22 DIAGNOSIS — E785 Hyperlipidemia, unspecified: Secondary | ICD-10-CM | POA: Diagnosis not present

## 2016-09-22 DIAGNOSIS — I255 Ischemic cardiomyopathy: Secondary | ICD-10-CM | POA: Insufficient documentation

## 2016-09-22 DIAGNOSIS — K219 Gastro-esophageal reflux disease without esophagitis: Secondary | ICD-10-CM | POA: Diagnosis not present

## 2016-09-22 DIAGNOSIS — Z7902 Long term (current) use of antithrombotics/antiplatelets: Secondary | ICD-10-CM | POA: Insufficient documentation

## 2016-09-22 DIAGNOSIS — Z79899 Other long term (current) drug therapy: Secondary | ICD-10-CM | POA: Diagnosis not present

## 2016-09-22 DIAGNOSIS — M1611 Unilateral primary osteoarthritis, right hip: Secondary | ICD-10-CM | POA: Diagnosis not present

## 2016-09-22 DIAGNOSIS — I10 Essential (primary) hypertension: Secondary | ICD-10-CM | POA: Diagnosis not present

## 2016-09-22 DIAGNOSIS — E119 Type 2 diabetes mellitus without complications: Secondary | ICD-10-CM | POA: Diagnosis not present

## 2016-09-22 DIAGNOSIS — Z7984 Long term (current) use of oral hypoglycemic drugs: Secondary | ICD-10-CM | POA: Insufficient documentation

## 2016-09-22 DIAGNOSIS — I2510 Atherosclerotic heart disease of native coronary artery without angina pectoris: Secondary | ICD-10-CM | POA: Diagnosis not present

## 2016-09-22 DIAGNOSIS — Z01812 Encounter for preprocedural laboratory examination: Secondary | ICD-10-CM | POA: Insufficient documentation

## 2016-09-22 DIAGNOSIS — Z0183 Encounter for blood typing: Secondary | ICD-10-CM | POA: Insufficient documentation

## 2016-09-22 DIAGNOSIS — M069 Rheumatoid arthritis, unspecified: Secondary | ICD-10-CM | POA: Insufficient documentation

## 2016-09-22 DIAGNOSIS — Z955 Presence of coronary angioplasty implant and graft: Secondary | ICD-10-CM | POA: Diagnosis not present

## 2016-09-22 DIAGNOSIS — Z87891 Personal history of nicotine dependence: Secondary | ICD-10-CM | POA: Insufficient documentation

## 2016-09-22 DIAGNOSIS — Z7982 Long term (current) use of aspirin: Secondary | ICD-10-CM | POA: Insufficient documentation

## 2016-09-22 LAB — COMPREHENSIVE METABOLIC PANEL
ALT: 28 U/L (ref 17–63)
AST: 31 U/L (ref 15–41)
Albumin: 4.2 g/dL (ref 3.5–5.0)
Alkaline Phosphatase: 79 U/L (ref 38–126)
Anion gap: 10 (ref 5–15)
BUN: 13 mg/dL (ref 6–20)
CO2: 27 mmol/L (ref 22–32)
Calcium: 9.7 mg/dL (ref 8.9–10.3)
Chloride: 100 mmol/L — ABNORMAL LOW (ref 101–111)
Creatinine, Ser: 1.14 mg/dL (ref 0.61–1.24)
GFR calc Af Amer: >60 mL/min (ref >60)
GFR calc non Af Amer: >60 mL/min (ref >60)
Glucose, Bld: 135 mg/dL — ABNORMAL HIGH (ref 65–99)
Potassium: 4 mmol/L (ref 3.5–5.1)
Sodium: 137 mmol/L (ref 135–145)
Total Bilirubin: 0.6 mg/dL (ref 0.3–1.2)
Total Protein: 6.9 g/dL (ref 6.5–8.1)

## 2016-09-22 LAB — CBC WITH DIFFERENTIAL/PLATELET
Basophils Absolute: 0 10*3/uL (ref 0.0–0.1)
Basophils Relative: 0 %
Eosinophils Absolute: 0.5 10*3/uL (ref 0.0–0.7)
Eosinophils Relative: 6 %
HCT: 38.7 % — ABNORMAL LOW (ref 39.0–52.0)
Hemoglobin: 12.4 g/dL — ABNORMAL LOW (ref 13.0–17.0)
Lymphocytes Relative: 41 %
Lymphs Abs: 3.2 10*3/uL (ref 0.7–4.0)
MCH: 26.7 pg (ref 26.0–34.0)
MCHC: 32 g/dL (ref 30.0–36.0)
MCV: 83.4 fL (ref 78.0–100.0)
Monocytes Absolute: 0.6 10*3/uL (ref 0.1–1.0)
Monocytes Relative: 8 %
Neutro Abs: 3.5 10*3/uL (ref 1.7–7.7)
Neutrophils Relative %: 45 %
Platelets: 252 10*3/uL (ref 150–400)
RBC: 4.64 MIL/uL (ref 4.22–5.81)
RDW: 15.9 % — ABNORMAL HIGH (ref 11.5–15.5)
WBC: 7.8 10*3/uL (ref 4.0–10.5)

## 2016-09-22 LAB — APTT: aPTT: 30 seconds (ref 24–36)

## 2016-09-22 LAB — GLUCOSE, CAPILLARY: Glucose-Capillary: 128 mg/dL — ABNORMAL HIGH (ref 65–99)

## 2016-09-22 LAB — TYPE AND SCREEN
ABO/RH(D): B POS
Antibody Screen: NEGATIVE

## 2016-09-22 LAB — PROTIME-INR
INR: 0.95
Prothrombin Time: 12.6 seconds (ref 11.4–15.2)

## 2016-09-22 LAB — URINALYSIS, ROUTINE W REFLEX MICROSCOPIC
Bilirubin Urine: NEGATIVE
Glucose, UA: NEGATIVE mg/dL
Hgb urine dipstick: NEGATIVE
Ketones, ur: NEGATIVE mg/dL
Leukocytes, UA: NEGATIVE
Nitrite: NEGATIVE
Protein, ur: NEGATIVE mg/dL
Specific Gravity, Urine: 1.015 (ref 1.005–1.030)
pH: 5 (ref 5.0–8.0)

## 2016-09-22 LAB — SURGICAL PCR SCREEN
MRSA, PCR: NEGATIVE
Staphylococcus aureus: NEGATIVE

## 2016-09-22 NOTE — Pre-Procedure Instructions (Signed)
Thomas Mcclure  09/22/2016      [place], [place] [address] [place] [address] [place] [address] Phone: [phone] Fax: [phone]  [facility] [place] - [place], [place] AT [place] [address] Phone: [phone] Fax: [phone]    Your procedure is scheduled on Friday August 10.  Report to [facility] Admitting at 5:30 A.M.  Call this number if you have problems the morning of surgery:  [phone]   Remember:  Do not eat food or drink liquids after midnight.   Continue all other medications as directed by your physician except follow these instructions about you medications   Take these medicines the morning of surgery with A SIP OF WATER: diazepam (valium) if needed, DILT-XR, duloxetine (cymbalta), famotidine (pepcid), omeprazole (prilosec), pregabalin (lyrica), tramadol (ultram) if needed or oxycodone if needed, flexeril if needed  7 days prior to surgery STOP taking any  Aleve, Naproxen, Ibuprofen, Motrin, Advil, Goody's, BC's, all herbal medications, fish oil, and all vitamins  FOLLOW MD's instructions on stopping Plavix and Aspirin  WHAT DO I DO ABOUT MY DIABETES MEDICATION?   Marland Kitchen Do not take oral diabetes medicines (pills) the morning of surgery. DO NOT TAKE metformin (glucophage) the day of surgery.   How to Manage Your Diabetes Before and After Surgery  Why is it important to control my blood sugar before and after surgery? . Improving blood sugar levels before and after surgery helps healing and can limit problems. . A way of improving blood sugar control is eating a healthy diet by: o  Eating less sugar and carbohydrates o  Increasing activity/exercise o  Talking with your doctor about reaching your blood sugar goals . High blood sugars (greater than 180 mg/dL) can raise your risk of infections and  slow your recovery, so you will need to focus on controlling your diabetes during the weeks before surgery. . Make sure that the doctor who takes care of your diabetes knows about your planned surgery including the date and location.  How do I manage my blood sugar before surgery? . Check your blood sugar at least 4 times a day, starting 2 days before surgery, to make sure that the level is not too high or low. o Check your blood sugar the morning of your surgery when you wake up and every 2 hours until you get to the Short Stay unit. . If your blood sugar is less than 70 mg/dL, you will need to treat for low blood sugar: o Do not take insulin. o Treat a low blood sugar (less than 70 mg/dL) with  cup of clear juice (cranberry or apple), 4 glucose tablets, OR glucose gel. o Recheck blood sugar in 15 minutes after treatment (to make sure it is greater than 70 mg/dL). If your blood sugar is not greater than 70 mg/dL on recheck, call [phone] for further instructions. . Report your blood sugar to the short stay nurse when you get to Short Stay.  . If you are admitted to the hospital after surgery: o Your blood sugar will be checked by the staff and you will probably be given insulin after surgery (instead of oral diabetes medicines) to make sure you have good blood sugar levels. o The goal for blood sugar control after surgery is 80-180 mg/dL.    Do not wear  jewelry  Do not wear lotions, powders, or colognes, or deoderant.   Men may shave face and neck.  Do not bring valuables to the hospital.  [facility] is not responsible for any belongings or valuables.  Contacts, dentures or bridgework may not be worn into surgery.  Leave your suitcase in the car.  After surgery it may be brought to your room.  For patients admitted to the hospital, discharge time will be determined by your treatment team.  Patients discharged the day of surgery will not be allowed to drive home.    Special  instructions:    [place]- Preparing For Surgery  Before surgery, you can play an important role. Because skin is not sterile, your skin needs to be as free of germs as possible. You can reduce the number of germs on your skin by washing with CHG (chlorahexidine gluconate) Soap before surgery.  CHG is an antiseptic cleaner which kills germs and bonds with the skin to continue killing germs even after washing.  Please do not use if you have an allergy to CHG or antibacterial soaps. If your skin becomes reddened/irritated stop using the CHG.  Do not shave (including legs and underarms) for at least 48 hours prior to first CHG shower. It is OK to shave your face.  Please follow these instructions carefully.   1. Shower the NIGHT BEFORE SURGERY and the MORNING OF SURGERY with CHG.   2. If you chose to wash your hair, wash your hair first as usual with your normal shampoo.  3. After you shampoo, rinse your hair and body thoroughly to remove the shampoo.  4. Use CHG as you would any other liquid soap. You can apply CHG directly to the skin and wash gently with a scrungie or a clean washcloth.   5. Apply the CHG Soap to your body ONLY FROM THE NECK DOWN.  Do not use on open wounds or open sores. Avoid contact with your eyes, ears, mouth and genitals (private parts). Wash genitals (private parts) with your normal soap.  6. Wash thoroughly, paying special attention to the area where your surgery will be performed.  7. Thoroughly rinse your body with warm water from the neck down.  8. DO NOT shower/wash with your normal soap after using and rinsing off the CHG Soap.  9. Pat yourself dry with a CLEAN TOWEL.   10. Wear CLEAN PAJAMAS   11. Place CLEAN SHEETS on your bed the night of your first shower and DO NOT SLEEP WITH PETS.    Day of Surgery: Do not apply any deodorants/lotions. Please wear clean clothes to the hospital/surgery center.      Please read over the following fact sheets  that you were given. Total Joint Packet and MRSA Information

## 2016-09-22 NOTE — Progress Notes (Signed)
PCP - Kevan Ny Cardiologist - Daneen Schick  Chest x-ray - 05/01/16 EKG - 05/01/16 Stress Test - 05/01/16 ECHO - 12/31/15 Cardiac Cath - 08/07/15    Fasting Blood Sugar - 113-120 Checks Blood Sugar __2___ times a week  Last dose of Plavix and Aspirin should be 09/27/16   Patient denies shortness of breath, fever, cough and chest pain at PAT appointment   Patient verbalized understanding of instructions that were given to them at the PAT appointment. Patient was also instructed that they will need to review over the PAT instructions again at home before surgery.

## 2016-09-23 ENCOUNTER — Encounter (HOSPITAL_COMMUNITY): Payer: Self-pay

## 2016-09-23 LAB — URINE CULTURE: Culture: <10000 — AB

## 2016-09-23 LAB — HEMOGLOBIN A1C
Hgb A1c MFr Bld: 7.6 % — ABNORMAL HIGH (ref 4.8–5.6)
Mean Plasma Glucose: 171 mg/dL

## 2016-09-23 NOTE — Progress Notes (Signed)
Anesthesia Chart Review:  Pt is a 61 year old male scheduled for R total hip arthroplasty on 10/03/2016 with [name], MD  - PCP is Kevan Ny, MD - Cardiologist is Daneen Schick, MD who cleared pt for surgery at last office visit 05/21/16  PMH includes:  CAD (DES to RCA 07/15/15), ischemic cardiomyopathy, HTN, DM, hyperlipidemia, RA, GERD. Former smoker. BMI 36. S/p spinal cord stimulator insertion 01/07/13. S/p excision cervical lipoma 04/19/12.   Medications include: ASA 81 mg, Lipitor, chlorthalidone, Plavix, diltiazem, Pepcid, HCTZ, lisinopril, metformin, Prilosec, potassium. Pt to stop plavix 7 days before surgery, and per [name], does not need to restart it post-op. Pt to continue ASA perioperatively.   BP 118/81   Pulse 81   Temp 36.8 C   Resp 18   Ht 5\' 10"  (1.778 m)   Wt 252 lb 12.8 oz (114.7 kg)   SpO2 95%   BMI 36.27 kg/m   Preoperative labs reviewed.  HbA1c 7.6, glucose 135  CXR 09/22/16:  - No active cardiopulmonary disease.  - Thoracic neurostimulators, 1 ventral and 1 dorsal in the midline.  EKG 04/30/16: Sinus rhythm. Nonspecific intraventricular conduction delay. Abnormal inferior Q waves. Borderline T abnormalities, anterior leads  Nuclear stress test 05/01/16: 1. No reversible ischemia.  Old infarct/scar inferiorly. 2. Decreased wall motion and thickening inferiorly. 3. Left ventricular ejection fraction 44% 4. Non invasive risk stratification*: Intermediate  Cardiac event monitor 01/15/16:   NSR with occasional sinus tachycardia  No arrhythmia to associate with symptoms  Echo 12/31/15:  - Left ventricle: The cavity size was normal. There was mild concentric hypertrophy. Systolic function was normal. The estimated ejection fraction was in the range of 55% to 60%. There is akinesis of the basal-midinferior myocardium. Doppler parameters are consistent with abnormal left ventricular relaxation (grade 1 diastolic dysfunction).  Cardiac cath 08/07/15:  1. Prox  LAD to Mid LAD lesion, 20% stenosed. 2. Prox Cx to Mid Cx lesion, 20% stenosed. 3. Ost 1st Mrg to 1st Mrg lesion, 50% stenosed.  Widely patent right coronary artery including recently placed proximal stent at the time of acute coronary intervention.  Irregularities noted in the proximal and mid LAD as well as circumflex. There is up to 50% stenosis in the proximal segment of the large first obtuse marginal.  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 Overall LV function is moderately decreased with an EF of 35-40%. LVEDP 23 mmHg.  No obvious explanation for the patient's ongoing chest pain complaints.  CT angio chest/abd/pelvis 07/15/15:  - CTA CHEST: No acute vascular process. Mild bronchial wall thickening can be seen with reactive airway disease or bronchitis. Mild cardiomegaly. - CTA ABDOMEN AND PELVIS: No acute vascular process or acute intra-abdominal/pelvic process. Status post L3 through S1 PLIF, with findings of L5-S1 hardware failure.  If no changes, I anticipate pt can proceed with surgery as scheduled.   Willeen Cass, FNP-BC [facility] Stay [facility]/Anesthesiology Phone: [phone] 09/23/2016 4:59 PM

## 2016-10-02 MED ORDER — TRANEXAMIC ACID 1000 MG/10ML IV SOLN
1000.0000 mg | INTRAVENOUS | Status: AC
  Administered 2016-10-03: 1000 mg via INTRAVENOUS
  Filled 2016-10-02: qty 1100

## 2016-10-02 MED ORDER — SODIUM CHLORIDE 0.9 % IV SOLN: INTRAVENOUS | Status: DC

## 2016-10-02 MED ORDER — CEFAZOLIN SODIUM 10 G IJ SOLR
3.0000 g | INTRAMUSCULAR | Status: AC
  Administered 2016-10-03: 3 g via INTRAVENOUS
  Filled 2016-10-02: qty 3000

## 2016-10-03 ENCOUNTER — Inpatient Hospital Stay (HOSPITAL_COMMUNITY): Payer: Medicare Other | Admitting: Emergency Medicine

## 2016-10-03 ENCOUNTER — Inpatient Hospital Stay (HOSPITAL_COMMUNITY)
Admission: RE | Admit: 2016-10-03 | Discharge: 2016-10-05 | DRG: 470 | Disposition: A | Discharge status: Home / Self Care | Payer: Medicare Other | Source: Ambulatory Visit | Attending: Orthopedic Surgery | Admitting: Orthopedic Surgery

## 2016-10-03 ENCOUNTER — Inpatient Hospital Stay (HOSPITAL_COMMUNITY): Payer: Medicare Other

## 2016-10-03 ENCOUNTER — Encounter (HOSPITAL_COMMUNITY)
Admission: RE | Disposition: A | Discharge status: Home / Self Care | Payer: Self-pay | Source: Ambulatory Visit | Attending: Orthopedic Surgery

## 2016-10-03 ENCOUNTER — Encounter (HOSPITAL_COMMUNITY): Payer: Self-pay | Admitting: Urology

## 2016-10-03 ENCOUNTER — Inpatient Hospital Stay (HOSPITAL_COMMUNITY): Payer: Medicare Other | Admitting: Anesthesiology

## 2016-10-03 DIAGNOSIS — Z87891 Personal history of nicotine dependence: Secondary | ICD-10-CM

## 2016-10-03 DIAGNOSIS — E669 Obesity, unspecified: Secondary | ICD-10-CM | POA: Diagnosis present

## 2016-10-03 DIAGNOSIS — Z6834 Body mass index (BMI) 34.0-34.9, adult: Secondary | ICD-10-CM

## 2016-10-03 DIAGNOSIS — I255 Ischemic cardiomyopathy: Secondary | ICD-10-CM | POA: Diagnosis present

## 2016-10-03 DIAGNOSIS — Z9889 Other specified postprocedural states: Secondary | ICD-10-CM | POA: Diagnosis not present

## 2016-10-03 DIAGNOSIS — Z955 Presence of coronary angioplasty implant and graft: Secondary | ICD-10-CM

## 2016-10-03 DIAGNOSIS — E119 Type 2 diabetes mellitus without complications: Secondary | ICD-10-CM | POA: Diagnosis present

## 2016-10-03 DIAGNOSIS — Z8249 Family history of ischemic heart disease and other diseases of the circulatory system: Secondary | ICD-10-CM | POA: Diagnosis not present

## 2016-10-03 DIAGNOSIS — I2510 Atherosclerotic heart disease of native coronary artery without angina pectoris: Secondary | ICD-10-CM | POA: Diagnosis present

## 2016-10-03 DIAGNOSIS — K219 Gastro-esophageal reflux disease without esophagitis: Secondary | ICD-10-CM | POA: Diagnosis present

## 2016-10-03 DIAGNOSIS — I10 Essential (primary) hypertension: Secondary | ICD-10-CM | POA: Diagnosis present

## 2016-10-03 DIAGNOSIS — I252 Old myocardial infarction: Secondary | ICD-10-CM | POA: Diagnosis not present

## 2016-10-03 DIAGNOSIS — M1611 Unilateral primary osteoarthritis, right hip: Secondary | ICD-10-CM | POA: Diagnosis present

## 2016-10-03 DIAGNOSIS — Z8042 Family history of malignant neoplasm of prostate: Secondary | ICD-10-CM

## 2016-10-03 DIAGNOSIS — Z888 Allergy status to other drugs, medicaments and biological substances status: Secondary | ICD-10-CM | POA: Diagnosis not present

## 2016-10-03 DIAGNOSIS — M1990 Unspecified osteoarthritis, unspecified site: Secondary | ICD-10-CM | POA: Diagnosis present

## 2016-10-03 HISTORY — PX: TOTAL HIP ARTHROPLASTY: SHX124

## 2016-10-03 LAB — GLUCOSE, CAPILLARY
Glucose-Capillary: 140 mg/dL — ABNORMAL HIGH (ref 65–99)
Glucose-Capillary: 120 mg/dL — ABNORMAL HIGH (ref 65–99)
Glucose-Capillary: 106 mg/dL — ABNORMAL HIGH (ref 65–99)
Glucose-Capillary: 128 mg/dL — ABNORMAL HIGH (ref 65–99)

## 2016-10-03 SURGERY — ARTHROPLASTY, HIP, TOTAL,POSTERIOR APPROACH
Anesthesia: Monitor Anesthesia Care | Laterality: Right

## 2016-10-03 MED ORDER — FLEET ENEMA 7-19 GM/118ML RE ENEM: 1.0000 | ENEMA | Freq: Once | RECTAL | Status: DC | PRN

## 2016-10-03 MED ORDER — DILTIAZEM HCL ER COATED BEADS 180 MG PO CP24
180.0000 mg | ORAL_CAPSULE | Freq: Every day | ORAL | Status: DC
  Administered 2016-10-04 – 2016-10-05 (×2): 180 mg via ORAL
  Filled 2016-10-03 (×3): qty 1

## 2016-10-03 MED ORDER — LIDOCAINE 2% (20 MG/ML) 5 ML SYRINGE
INTRAMUSCULAR | Status: AC
  Filled 2016-10-03: qty 5

## 2016-10-03 MED ORDER — ALBUMIN HUMAN 5 % IV SOLN
INTRAVENOUS | Status: DC | PRN
  Administered 2016-10-03: 09:00:00 via INTRAVENOUS

## 2016-10-03 MED ORDER — INSULIN ASPART 100 UNIT/ML ~~LOC~~ SOLN
6.0000 [IU] | Freq: Three times a day (TID) | SUBCUTANEOUS | Status: DC
  Administered 2016-10-04 – 2016-10-05 (×6): 6 [IU] via SUBCUTANEOUS

## 2016-10-03 MED ORDER — SODIUM CHLORIDE 0.9 % IV SOLN
INTRAVENOUS | Status: DC
  Administered 2016-10-03 – 2016-10-04 (×2): via INTRAVENOUS

## 2016-10-03 MED ORDER — ACETAMINOPHEN 325 MG PO TABS
650.0000 mg | ORAL_TABLET | Freq: Four times a day (QID) | ORAL | Status: DC | PRN
  Administered 2016-10-03 – 2016-10-05 (×7): 650 mg via ORAL
  Filled 2016-10-03 (×8): qty 2

## 2016-10-03 MED ORDER — PROMETHAZINE HCL 25 MG/ML IJ SOLN: 6.2500 mg | INTRAMUSCULAR | Status: DC | PRN

## 2016-10-03 MED ORDER — DIPHENHYDRAMINE HCL 12.5 MG/5ML PO ELIX: 12.5000 mg | ORAL_SOLUTION | ORAL | Status: DC | PRN

## 2016-10-03 MED ORDER — MENTHOL 3 MG MT LOZG: 1.0000 | LOZENGE | OROMUCOSAL | Status: DC | PRN

## 2016-10-03 MED ORDER — MIDAZOLAM HCL 2 MG/2ML IJ SOLN
INTRAMUSCULAR | Status: AC
  Filled 2016-10-03: qty 2

## 2016-10-03 MED ORDER — OXYCODONE HCL 5 MG PO TABS
10.0000 mg | ORAL_TABLET | ORAL | Status: DC | PRN
  Administered 2016-10-03 – 2016-10-05 (×11): 10 mg via ORAL
  Filled 2016-10-03 (×11): qty 2

## 2016-10-03 MED ORDER — SODIUM CHLORIDE 0.9 % IV SOLN
2000.0000 mg | INTRAVENOUS | Status: AC
  Administered 2016-10-03: 2000 mg via TOPICAL
  Filled 2016-10-03: qty 20

## 2016-10-03 MED ORDER — CHLORHEXIDINE GLUCONATE 4 % EX LIQD: 60.0000 mL | Freq: Once | CUTANEOUS | Status: DC

## 2016-10-03 MED ORDER — ATORVASTATIN CALCIUM 80 MG PO TABS
80.0000 mg | ORAL_TABLET | Freq: Every day | ORAL | Status: DC
  Administered 2016-10-03 – 2016-10-05 (×3): 80 mg via ORAL
  Filled 2016-10-03 (×3): qty 1

## 2016-10-03 MED ORDER — CHLORTHALIDONE 25 MG PO TABS
25.0000 mg | ORAL_TABLET | Freq: Every day | ORAL | Status: DC
  Administered 2016-10-04 – 2016-10-05 (×2): 25 mg via ORAL
  Filled 2016-10-03 (×2): qty 1

## 2016-10-03 MED ORDER — BUPIVACAINE-EPINEPHRINE 0.5% -1:200000 IJ SOLN
INTRAMUSCULAR | Status: DC | PRN
  Administered 2016-10-03: 20 mL

## 2016-10-03 MED ORDER — POTASSIUM CHLORIDE CRYS ER 20 MEQ PO TBCR
20.0000 meq | EXTENDED_RELEASE_TABLET | Freq: Every day | ORAL | Status: DC
  Administered 2016-10-03 – 2016-10-04 (×2): 20 meq via ORAL
  Filled 2016-10-03 (×2): qty 1

## 2016-10-03 MED ORDER — FENTANYL CITRATE (PF) 250 MCG/5ML IJ SOLN
INTRAMUSCULAR | Status: AC
  Filled 2016-10-03: qty 5

## 2016-10-03 MED ORDER — CYCLOBENZAPRINE HCL 10 MG PO TABS
10.0000 mg | ORAL_TABLET | Freq: Every day | ORAL | Status: DC | PRN
  Administered 2016-10-03 – 2016-10-05 (×2): 10 mg via ORAL
  Filled 2016-10-03 (×2): qty 1

## 2016-10-03 MED ORDER — PREGABALIN 100 MG PO CAPS
200.0000 mg | ORAL_CAPSULE | Freq: Every day | ORAL | Status: DC
  Administered 2016-10-03 – 2016-10-04 (×2): 200 mg via ORAL
  Filled 2016-10-03 (×2): qty 2

## 2016-10-03 MED ORDER — OXYCODONE HCL 5 MG/5ML PO SOLN: 5.0000 mg | Freq: Once | ORAL | Status: AC | PRN

## 2016-10-03 MED ORDER — ADULT MULTIVITAMIN W/MINERALS CH
1.0000 | ORAL_TABLET | Freq: Every day | ORAL | Status: DC
  Administered 2016-10-03 – 2016-10-05 (×3): 1 via ORAL
  Filled 2016-10-03 (×3): qty 1

## 2016-10-03 MED ORDER — SENNOSIDES-DOCUSATE SODIUM 8.6-50 MG PO TABS: 1.0000 | ORAL_TABLET | Freq: Every evening | ORAL | Status: DC | PRN

## 2016-10-03 MED ORDER — INSULIN ASPART 100 UNIT/ML ~~LOC~~ SOLN: 0.0000 [IU] | Freq: Every day | SUBCUTANEOUS | Status: DC

## 2016-10-03 MED ORDER — PROPOFOL 500 MG/50ML IV EMUL
INTRAVENOUS | Status: DC | PRN
  Administered 2016-10-03: 100 ug/kg/min via INTRAVENOUS
  Administered 2016-10-03: 09:00:00 via INTRAVENOUS

## 2016-10-03 MED ORDER — NITROGLYCERIN 0.4 MG SL SUBL: 0.4000 mg | SUBLINGUAL_TABLET | SUBLINGUAL | Status: DC | PRN

## 2016-10-03 MED ORDER — CEFAZOLIN SODIUM-DEXTROSE 1-4 GM/50ML-% IV SOLN
1.0000 g | Freq: Four times a day (QID) | INTRAVENOUS | Status: AC
  Administered 2016-10-03 (×2): 1 g via INTRAVENOUS
  Filled 2016-10-03 (×2): qty 50

## 2016-10-03 MED ORDER — METOCLOPRAMIDE HCL 5 MG/ML IJ SOLN: 5.0000 mg | Freq: Three times a day (TID) | INTRAMUSCULAR | Status: DC | PRN

## 2016-10-03 MED ORDER — HYDROCHLOROTHIAZIDE 25 MG PO TABS
25.0000 mg | ORAL_TABLET | Freq: Every day | ORAL | Status: DC
  Administered 2016-10-04: 25 mg via ORAL
  Filled 2016-10-03: qty 1

## 2016-10-03 MED ORDER — ACETAMINOPHEN 650 MG RE SUPP: 650.0000 mg | Freq: Four times a day (QID) | RECTAL | Status: DC | PRN

## 2016-10-03 MED ORDER — HYDROMORPHONE HCL 1 MG/ML IJ SOLN
0.2500 mg | INTRAMUSCULAR | Status: DC | PRN
  Administered 2016-10-03 (×2): 0.5 mg via INTRAVENOUS

## 2016-10-03 MED ORDER — ONDANSETRON HCL 4 MG/2ML IJ SOLN
4.0000 mg | Freq: Four times a day (QID) | INTRAMUSCULAR | Status: DC | PRN
  Administered 2016-10-04: 4 mg via INTRAVENOUS
  Filled 2016-10-03: qty 2

## 2016-10-03 MED ORDER — POLYETHYLENE GLYCOL 3350 17 G PO PACK: 17.0000 g | PACK | Freq: Every day | ORAL | Status: DC | PRN

## 2016-10-03 MED ORDER — LACTATED RINGERS IV SOLN
INTRAVENOUS | Status: DC | PRN
  Administered 2016-10-03 (×2): via INTRAVENOUS

## 2016-10-03 MED ORDER — OXYCODONE HCL 5 MG PO TABS
5.0000 mg | ORAL_TABLET | Freq: Once | ORAL | Status: AC | PRN
  Administered 2016-10-03: 5 mg via ORAL

## 2016-10-03 MED ORDER — INSULIN ASPART 100 UNIT/ML ~~LOC~~ SOLN
0.0000 [IU] | Freq: Three times a day (TID) | SUBCUTANEOUS | Status: DC
  Administered 2016-10-04: 3 [IU] via SUBCUTANEOUS
  Administered 2016-10-04 (×2): 4 [IU] via SUBCUTANEOUS
  Administered 2016-10-05 (×3): 3 [IU] via SUBCUTANEOUS

## 2016-10-03 MED ORDER — POLYETHYLENE GLYCOL 3350 17 GM/SCOOP PO POWD
17.0000 g | Freq: Every day | ORAL | Status: DC | PRN
  Filled 2016-10-03: qty 255

## 2016-10-03 MED ORDER — ONDANSETRON HCL 4 MG PO TABS: 4.0000 mg | ORAL_TABLET | Freq: Four times a day (QID) | ORAL | Status: DC | PRN

## 2016-10-03 MED ORDER — TRANEXAMIC ACID 1000 MG/10ML IV SOLN
1000.0000 mg | Freq: Once | INTRAVENOUS | Status: AC
  Administered 2016-10-03: 1000 mg via INTRAVENOUS
  Filled 2016-10-03: qty 10

## 2016-10-03 MED ORDER — BUPIVACAINE-EPINEPHRINE (PF) 0.5% -1:200000 IJ SOLN
INTRAMUSCULAR | Status: AC
  Filled 2016-10-03: qty 30

## 2016-10-03 MED ORDER — BUPIVACAINE LIPOSOME 1.3 % IJ SUSP
20.0000 mL | INTRAMUSCULAR | Status: AC
  Administered 2016-10-03: 20 mL
  Filled 2016-10-03: qty 20

## 2016-10-03 MED ORDER — PROPOFOL 10 MG/ML IV BOLUS
INTRAVENOUS | Status: AC
  Filled 2016-10-03: qty 40

## 2016-10-03 MED ORDER — PREGABALIN 100 MG PO CAPS
100.0000 mg | ORAL_CAPSULE | Freq: Every day | ORAL | Status: DC
  Administered 2016-10-04 – 2016-10-05 (×2): 100 mg via ORAL
  Filled 2016-10-03 (×2): qty 1

## 2016-10-03 MED ORDER — OXYCODONE HCL 5 MG PO TABS
ORAL_TABLET | ORAL | Status: AC
  Filled 2016-10-03: qty 1

## 2016-10-03 MED ORDER — OXYCODONE HCL 10 MG PO TABS: 10.0000 mg | ORAL_TABLET | ORAL | 0 refills | Status: DC | PRN

## 2016-10-03 MED ORDER — ONDANSETRON HCL 4 MG/2ML IJ SOLN
INTRAMUSCULAR | Status: AC
  Filled 2016-10-03: qty 2

## 2016-10-03 MED ORDER — DULOXETINE HCL 60 MG PO CPEP
60.0000 mg | ORAL_CAPSULE | Freq: Every day | ORAL | Status: DC
  Administered 2016-10-04 – 2016-10-05 (×2): 60 mg via ORAL
  Filled 2016-10-03 (×2): qty 1

## 2016-10-03 MED ORDER — PHENYLEPHRINE HCL 10 MG/ML IJ SOLN
INTRAMUSCULAR | Status: DC | PRN
  Administered 2016-10-03: 25 ug/min via INTRAVENOUS

## 2016-10-03 MED ORDER — APIXABAN 2.5 MG PO TABS
2.5000 mg | ORAL_TABLET | Freq: Two times a day (BID) | ORAL | Status: DC
  Administered 2016-10-04 – 2016-10-05 (×3): 2.5 mg via ORAL
  Filled 2016-10-03 (×3): qty 1

## 2016-10-03 MED ORDER — DOCUSATE SODIUM 100 MG PO CAPS
100.0000 mg | ORAL_CAPSULE | Freq: Two times a day (BID) | ORAL | Status: DC
  Administered 2016-10-03 – 2016-10-05 (×4): 100 mg via ORAL
  Filled 2016-10-03 (×4): qty 1

## 2016-10-03 MED ORDER — HYDROMORPHONE HCL 1 MG/ML IJ SOLN
INTRAMUSCULAR | Status: AC
  Filled 2016-10-03: qty 1

## 2016-10-03 MED ORDER — METOCLOPRAMIDE HCL 5 MG PO TABS: 5.0000 mg | ORAL_TABLET | Freq: Three times a day (TID) | ORAL | Status: DC | PRN

## 2016-10-03 MED ORDER — PHENOL 1.4 % MT LIQD: 1.0000 | OROMUCOSAL | Status: DC | PRN

## 2016-10-03 MED ORDER — SODIUM CHLORIDE 0.9 % IR SOLN
Status: DC | PRN
  Administered 2016-10-03: 3000 mL

## 2016-10-03 MED ORDER — PANTOPRAZOLE SODIUM 40 MG PO TBEC
80.0000 mg | DELAYED_RELEASE_TABLET | Freq: Every day | ORAL | Status: DC
  Administered 2016-10-04 – 2016-10-05 (×2): 80 mg via ORAL
  Filled 2016-10-03 (×2): qty 2

## 2016-10-03 MED ORDER — PHENYLEPHRINE 40 MCG/ML (10ML) SYRINGE FOR IV PUSH (FOR BLOOD PRESSURE SUPPORT)
PREFILLED_SYRINGE | INTRAVENOUS | Status: AC
  Filled 2016-10-03: qty 10

## 2016-10-03 MED ORDER — PHENYLEPHRINE HCL 10 MG/ML IJ SOLN
INTRAMUSCULAR | Status: DC | PRN
  Administered 2016-10-03: 80 ug via INTRAVENOUS
  Administered 2016-10-03 (×2): 120 ug via INTRAVENOUS
  Administered 2016-10-03: 80 ug via INTRAVENOUS

## 2016-10-03 MED ORDER — DIAZEPAM 5 MG PO TABS
10.0000 mg | ORAL_TABLET | Freq: Every evening | ORAL | Status: DC
  Administered 2016-10-03 – 2016-10-05 (×3): 10 mg via ORAL
  Filled 2016-10-03 (×3): qty 2

## 2016-10-03 MED ORDER — ONDANSETRON HCL 4 MG/2ML IJ SOLN
INTRAMUSCULAR | Status: DC | PRN
  Administered 2016-10-03: 4 mg via INTRAVENOUS

## 2016-10-03 MED ORDER — MIDAZOLAM HCL 5 MG/5ML IJ SOLN
INTRAMUSCULAR | Status: DC | PRN
  Administered 2016-10-03 (×2): 1 mg via INTRAVENOUS

## 2016-10-03 MED ORDER — APIXABAN 2.5 MG PO TABS: 2.5000 mg | ORAL_TABLET | Freq: Two times a day (BID) | ORAL | 0 refills | Status: DC

## 2016-10-03 MED ORDER — PREGABALIN 100 MG PO CAPS: 100.0000 mg | ORAL_CAPSULE | Freq: Two times a day (BID) | ORAL | Status: DC

## 2016-10-03 MED ORDER — LIDOCAINE 2% (20 MG/ML) 5 ML SYRINGE
INTRAMUSCULAR | Status: DC | PRN
  Administered 2016-10-03: 60 mg via INTRAVENOUS

## 2016-10-03 MED ORDER — BUPIVACAINE HCL (PF) 0.75 % IJ SOLN
INTRAMUSCULAR | Status: DC | PRN
  Administered 2016-10-03: 2 mL via INTRATHECAL

## 2016-10-03 MED ORDER — SORBITOL 70 % SOLN: 30.0000 mL | Freq: Every day | Status: DC | PRN

## 2016-10-03 MED ORDER — HYDROMORPHONE HCL 1 MG/ML IJ SOLN
1.0000 mg | INTRAMUSCULAR | Status: DC | PRN
  Administered 2016-10-03 – 2016-10-04 (×10): 1 mg via INTRAVENOUS
  Filled 2016-10-03 (×11): qty 1

## 2016-10-03 MED ORDER — LISINOPRIL 20 MG PO TABS
20.0000 mg | ORAL_TABLET | Freq: Every evening | ORAL | Status: DC
  Administered 2016-10-04 – 2016-10-05 (×2): 20 mg via ORAL
  Filled 2016-10-03 (×3): qty 1

## 2016-10-03 SURGICAL SUPPLY — 56 items
BLADE SAW SAG 73X25 THK (BLADE) ×2
BLADE SAW SGTL 73X25 THK (BLADE) ×1 IMPLANT
BRUSH FEMORAL CANAL (MISCELLANEOUS) IMPLANT
CAPT HIP TOTAL 2 ×2 IMPLANT
COVER SURGICAL LIGHT HANDLE (MISCELLANEOUS) ×3 IMPLANT
DRAPE INCISE IOBAN 66X45 STRL (DRAPES) ×2 IMPLANT
DRAPE ORTHO SPLIT 77X108 STRL (DRAPES) ×6
DRAPE SURG ORHT 6 SPLT 77X108 (DRAPES) ×2 IMPLANT
DRAPE U-SHAPE 47X51 STRL (DRAPES) ×3 IMPLANT
DRSG ADAPTIC 3X8 NADH LF (GAUZE/BANDAGES/DRESSINGS) ×3 IMPLANT
DRSG AQUACEL AG ADV 3.5X14 (GAUZE/BANDAGES/DRESSINGS) ×2 IMPLANT
DRSG PAD ABDOMINAL 8X10 ST (GAUZE/BANDAGES/DRESSINGS) ×6 IMPLANT
DURAPREP 26ML APPLICATOR (WOUND CARE) ×3 IMPLANT
ELECT BLADE 6.5 EXT (BLADE) IMPLANT
ELECT CAUTERY BLADE 6.4 (BLADE) ×3 IMPLANT
ELECT REM PT RETURN 9FT ADLT (ELECTROSURGICAL) ×3
ELECTRODE REM PT RTRN 9FT ADLT (ELECTROSURGICAL) ×1 IMPLANT
FACESHIELD WRAPAROUND (MASK) ×6 IMPLANT
FACESHIELD WRAPAROUND OR TEAM (MASK) ×2 IMPLANT
GAUZE SPONGE 4X4 12PLY STRL (GAUZE/BANDAGES/DRESSINGS) ×3 IMPLANT
GLOVE BIOGEL PI IND STRL 8 (GLOVE) ×2 IMPLANT
GLOVE BIOGEL PI INDICATOR 8 (GLOVE) ×4
GLOVE ORTHO TXT STRL SZ7.5 (GLOVE) ×6 IMPLANT
GLOVE SURG ORTHO 8.0 STRL STRW (GLOVE) ×6 IMPLANT
GOWN STRL REUS W/ TWL LRG LVL3 (GOWN DISPOSABLE) ×1 IMPLANT
GOWN STRL REUS W/ TWL XL LVL3 (GOWN DISPOSABLE) ×1 IMPLANT
GOWN STRL REUS W/TWL 2XL LVL3 (GOWN DISPOSABLE) ×3 IMPLANT
GOWN STRL REUS W/TWL LRG LVL3 (GOWN DISPOSABLE) ×3
GOWN STRL REUS W/TWL XL LVL3 (GOWN DISPOSABLE) ×3
HANDPIECE INTERPULSE COAX TIP (DISPOSABLE)
HOOD PEEL AWAY FACE SHEILD DIS (HOOD) ×3 IMPLANT
IMMOBILIZER KNEE 22 UNIV (SOFTGOODS) ×3 IMPLANT
KIT BASIN OR (CUSTOM PROCEDURE TRAY) ×3 IMPLANT
KIT ROOM TURNOVER OR (KITS) ×3 IMPLANT
MANIFOLD NEPTUNE II (INSTRUMENTS) ×3 IMPLANT
NDL MAYO TROCAR (NEEDLE) IMPLANT
NEEDLE 22X1 1/2 (OR ONLY) (NEEDLE) ×3 IMPLANT
NEEDLE MAYO TROCAR (NEEDLE) IMPLANT
NS IRRIG 1000ML POUR BTL (IV SOLUTION) ×3 IMPLANT
PACK TOTAL JOINT (CUSTOM PROCEDURE TRAY) ×3 IMPLANT
PACK UNIVERSAL I (CUSTOM PROCEDURE TRAY) ×3 IMPLANT
PAD ARMBOARD 7.5X6 YLW CONV (MISCELLANEOUS) ×6 IMPLANT
PRESSURIZER FEMORAL UNIV (MISCELLANEOUS) IMPLANT
SET HNDPC FAN SPRY TIP SCT (DISPOSABLE) IMPLANT
STAPLER VISISTAT 35W (STAPLE) ×3 IMPLANT
SUCTION FRAZIER HANDLE 10FR (MISCELLANEOUS) ×2
SUCTION TUBE FRAZIER 10FR DISP (MISCELLANEOUS) ×1 IMPLANT
SUT ETHIBOND 2 V 37 (SUTURE) ×3 IMPLANT
SUT VIC AB 0 CT1 27 (SUTURE) ×3
SUT VIC AB 0 CT1 27XBRD ANBCTR (SUTURE) ×1 IMPLANT
SUT VIC AB 2-0 CT1 27 (SUTURE) ×6
SUT VIC AB 2-0 CT1 TAPERPNT 27 (SUTURE) ×2 IMPLANT
SYR CONTROL 10ML LL (SYRINGE) ×3 IMPLANT
TOWEL OR 17X26 10 PK STRL BLUE (TOWEL DISPOSABLE) ×3 IMPLANT
TOWER CARTRIDGE SMART MIX (DISPOSABLE) IMPLANT
WATER STERILE IRR 1000ML POUR (IV SOLUTION) ×3 IMPLANT

## 2016-10-03 NOTE — Brief Op Note (Signed)
10/03/2016  9:49 AM  PATIENT:  Thomas Mcclure  61 y.o. male  PRE-OPERATIVE DIAGNOSIS:  OA RIGHT HIP  POST-OPERATIVE DIAGNOSIS:  OA RIGHT HIP   PROCEDURE:  Procedure(s): TOTAL HIP ARTHROPLASTY (Right)  SURGEON:  Surgeon(s) and Role:    [name], MD - Primary  PHYSICIAN ASSISTANT: Chriss Czar, PA-C  ASSISTANTS: OR staff x1    ANESTHESIA:   local and spinal  EBL:  Total I/O In: 1250 [I.V.:1000; IV Piggyback:250] Out: 1100 [Urine:600; Blood:500]  BLOOD ADMINISTERED:none  DRAINS: none   LOCAL MEDICATIONS USED:  MARCAINE     SPECIMEN:  No Specimen  DISPOSITION OF SPECIMEN:  N/A  COUNTS:  YES  TOURNIQUET:  * No tourniquets in log *  DICTATION: .Other Dictation: Dictation Number unknown  PLAN OF CARE: Admit to inpatient   PATIENT DISPOSITION:  PACU - hemodynamically stable.   Delay start of Pharmacological VTE agent (>24hrs) due to surgical blood loss or risk of bleeding: yes

## 2016-10-03 NOTE — Progress Notes (Addendum)
Patient right hand swollen. Pt states he was stung by a bee yesterday and the swelling has started decreasing since yesterday. Pt states this happened before in the past couple of months to both of his hands. Patient states he will show Dr. French Ana prior to surgery. Note left for Dr. French Ana as well.

## 2016-10-03 NOTE — Anesthesia Postprocedure Evaluation (Signed)
Anesthesia Post Note  Patient: Thomas Mcclure  Procedure(s) Performed: Procedure(s) (LRB): TOTAL HIP ARTHROPLASTY (Right)     Patient location during evaluation: PACU Anesthesia Type: MAC and Spinal Level of consciousness: oriented and awake and alert Pain management: pain level controlled Vital Signs Assessment: post-procedure vital signs reviewed and stable Respiratory status: spontaneous breathing and respiratory function stable Cardiovascular status: blood pressure returned to baseline and stable Postop Assessment: no headache and no backache Anesthetic complications: no    Last Vitals:  Vitals:   10/03/16 1038 10/03/16 1122  BP: 92/70 103/74  Pulse:  63  Resp: 13 13  Temp:    SpO2:  100%    Last Pain:  Vitals:   10/03/16 0628  TempSrc: Oral  PainSc:             L Sensory Level: L3-Anterior knee, lower leg (10/03/16 1120) R Sensory Level: L3-Anterior knee, lower leg (10/03/16 1120)  Lynda Rainwater

## 2016-10-03 NOTE — H&P (View-Only) (Signed)
TOTAL HIP ADMISSION H&P  Patient is admitted for right total hip arthroplasty.  Subjective:  Chief Complaint: right hip pain  HPI: Thomas Mcclure, 61 y.o. male, has a history of pain and functional disability in the right hip(s) due to arthritis and patient has failed non-surgical conservative treatments for greater than 12 weeks to include NSAID's and/or analgesics, corticosteriod injections, use of assistive devices and activity modification.  Onset of symptoms was gradual starting 3 years ago with gradually worsening course since that time.The patient noted no past surgery on the right hip(s).  Patient currently rates pain in the right hip at 8 out of 10 with activity. Patient has night pain, worsening of pain with activity and weight bearing, trendelenberg gait, pain that interfers with activities of daily living and pain with passive range of motion. Patient has evidence of periarticular osteophytes and joint space narrowing by imaging studies. This condition presents safety issues increasing the risk of falls.  There is no current active infection.  Patient Active Problem List   Diagnosis Date Noted  . Chest pain, rule out acute myocardial infarction 05/01/2016  . Diabetes mellitus type 2, controlled, with complications ([place]) 67/89/3810  . Obesity (BMI 30-39.9) 05/01/2016  . Left ventricular aneurysm 12/03/2015  . Ischemic cardiomyopathy   . Unstable angina ([place]) 08/06/2015  . CAD (coronary artery disease) 07/16/2015  . Hypokalemia 07/16/2015  . History of non-ST elevation myocardial infarction (NSTEMI) 07/15/2015  . Hypertension 07/15/2015  . Hyperlipidemia with target LDL less than 100 07/15/2015  . Chronic back pain 07/15/2015  . GERD (gastroesophageal reflux disease) 06/18/2012  . Chronic radicular low back pain 06/18/2012   Past Medical History:  Diagnosis Date  . Arthritis    Rheumatoid  . Baker's cyst    Left calf  . CAD in native artery, 07/15/15 PCI of RCA with DES  07/16/2015   a.   NSTEMI 5/17: LHC - pLAD 20, pLCx 20, OM1 30, pRCA 100, EF 45-50%>> PCI: 2.5 x 24 mm Promus DES to RCA  //  b.   Echo 5/17: mild LVH, EF 50-55%, no RWMA, mod RVE //  c. LHC 6/17: pLAD 20, pLCx 20, OM1 50, pRCA stent ok, EF 35-45% with mod sized inf wall and basal segment aneurysm  . Chronic back pain   . Cyst (solitary) of breast    ? cyst left calf ,knee  . Diabetes mellitus    diet controlled  . GERD (gastroesophageal reflux disease)   . Hyperlipidemia   . Hypertension    Dr. [name] [phone]  . Ischemic cardiomyopathy    a. LV-gram at time of LHC in 6/17 with EF 35-45%  //  b. Echo 7/17: EF 45-50%, inferior HK, grade 1 diastolic dysfunction, mildly dilated aortic root, moderately reduced RVSF, mild RAE  . Myocardial infarction ([place]) 2017  . 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 BACK SURGERY    . CARDIAC CATHETERIZATION N/A 07/15/2015   Procedure: Left Heart Cath and Coronary Angiography;  Surgeon: [name], MD;  Location: [place] [facility] CV LAB;  Service: Cardiovascular;  Laterality: N/A;  . CARDIAC CATHETERIZATION N/A 07/15/2015   Procedure: Coronary Stent Intervention;  Surgeon: [name], MD;  Location: [facility] CV LAB;  Service: Cardiovascular;  Laterality: N/A;  . CARDIAC CATHETERIZATION N/A 08/07/2015   Procedure: Left Heart Cath and Coronary Angiography;  Surgeon: [name], MD;  Location: [facility]  CV LAB;  Service: Cardiovascular;  Laterality: N/A;  . CORONARY STENT PLACEMENT  07/2015  . KNEE SURGERY    . LUMBAR SPINE SURGERY  03/2009  & 2012  . MASS EXCISION N/A 04/19/2012   Procedure: removal of posterior cervical lipoma;  Surgeon: [name], MD;  Location: [facility];  Service: Neurosurgery;  Laterality: N/A;  Removal of posterior cervical lipoma  . SPINAL CORD STIMULATOR INSERTION N/A 01/07/2013   Procedure:  SPINAL CORD STIMULATOR INSERTION;  Surgeon: [name], MD;  Location: [facility];  Service: Neurosurgery;  Laterality: N/A;  . WISDOM TOOTH EXTRACTION     hx of     (Not in a hospital admission) Allergies  Allergen Reactions  . Brilinta [Ticagrelor] Other (See Comments)    SOB and chest pain   . Methylprednisolone Other (See Comments)    B/P dropped and diaphoresis  . Prednisone Other (See Comments)    B/P dropped and patient began to sweat  . Toradol [Ketorolac Tromethamine] Other (See Comments)    bp dropped, diaphoresis  . Adhesive [Tape] Rash and Other (See Comments)    "blisters" ( NO SURGICAL TAPE, PLEASE)    Social History  Substance Use Topics  . Smoking status: Former Smoker    Years: 15.00    Types: Cigarettes  . Smokeless tobacco: Former Systems developer    Quit date: 04/08/2015     Comment: 5 cig a week  . Alcohol use 0.0 oz/week     Comment: Couple 40 ounces daily until 2012    Family History  Problem Relation Age of Onset  . Heart disease Mother   . Prostate cancer Brother      Review of Systems  Genitourinary: Positive for frequency.  Musculoskeletal: Positive for joint pain.  All other systems reviewed and are negative.   Objective:  Physical Exam  Constitutional: He is oriented to person, place, and time. He appears well-developed and well-nourished. No distress.  HENT:  Head: Normocephalic and atraumatic.  Nose: Nose normal.  Eyes: Pupils are equal, round, and reactive to light. Conjunctivae and EOM are normal.  Neck: Normal range of motion. Neck supple.  Cardiovascular: Normal rate, regular rhythm, normal heart sounds and intact distal pulses.   Respiratory: Effort normal and breath sounds normal. No respiratory distress. He has no wheezes.  GI: Soft. Bowel sounds are normal. He exhibits no distension. There is no tenderness.  Musculoskeletal:       Right hip: He exhibits decreased range of motion and tenderness.  Lymphadenopathy:    He has no cervical adenopathy.  Neurological: He is alert and  oriented to person, place, and time. No cranial nerve deficit.  Skin: Skin is warm and dry. No rash noted. No erythema.  Psychiatric: He has a normal mood and affect. His behavior is normal.    Vital signs in last 24 hours: @VSRANGES @  Labs:   Estimated body mass index is 34.44 kg/m as calculated from the following:   Height as of 09/12/16: 5\' 10"  (1.778 m).   Weight as of 09/12/16: 108.9 kg (240 lb).   Imaging Review Plain radiographs demonstrate severe degenerative joint disease of the right hip(s). The bone quality appears to be good for age and reported activity level.  Assessment/Plan:  End stage arthritis, right hip(s)  The patient history, physical examination, clinical judgement of the provider and imaging studies are consistent with end stage degenerative joint disease of the right hip(s) and total hip arthroplasty is deemed medically  necessary. The treatment options including medical management, injection therapy, arthroscopy and arthroplasty were discussed at length. The risks and benefits of total hip arthroplasty were presented and reviewed. The risks due to aseptic loosening, infection, stiffness, dislocation/subluxation,  thromboembolic complications and other imponderables were discussed.  The patient acknowledged the explanation, agreed to proceed with the plan and consent was signed. Patient is being admitted for inpatient treatment for surgery, pain control, PT, OT, prophylactic antibiotics, VTE prophylaxis, progressive ambulation and ADL's and discharge planning.The patient is planning to be discharged home with home health services

## 2016-10-03 NOTE — Progress Notes (Signed)
PT Cancellation Note  Patient Details Name: Thomas Mcclure MRN: [id number] DOB: [birth year]   Cancelled Treatment:    Reason Eval/Treat Not Completed: Medical issues which prohibited therapy Spoke with RN and RN reported pt's BP low and pt in increased pain. Requested to hold PT at this time. Will follow up as pt medically appropriate and as schedule allows.   [name], PT, DPT  Acute Rehabilitation Services  Pager: [phone]   [name] 10/03/2016, 2:57 PM

## 2016-10-03 NOTE — Interval H&P Note (Signed)
History and Physical Interval Note:  10/03/2016 7:32 AM  Thomas Mcclure  has presented today for surgery, with the diagnosis of OA RIGHT HIP  The various methods of treatment have been discussed with the patient and family. After consideration of risks, benefits and other options for treatment, the patient has consented to  Procedure(s): TOTAL HIP ARTHROPLASTY (Right) as a surgical intervention .  The patient's history has been reviewed, patient examined, no change in status, stable for surgery.  I have reviewed the patient's chart and labs.  Questions were answered to the patient's satisfaction.     Gunnison Chahal JR,W D

## 2016-10-03 NOTE — Anesthesia Procedure Notes (Signed)
Spinal  Patient location during procedure: OR Start time: 10/03/2016 7:40 AM End time: 10/03/2016 7:45 AM Staffing Anesthesiologist: Candida Peeling RAY Performed: anesthesiologist  Preanesthetic Checklist Completed: patient identified, site marked, surgical consent, pre-op evaluation, timeout performed, IV checked, risks and benefits discussed and monitors and equipment checked Spinal Block Patient position: sitting Prep: DuraPrep Patient monitoring: heart rate, cardiac monitor, continuous pulse ox and blood pressure Approach: midline Location: L3-4 Injection technique: single-shot Needle Needle type: Quincke  Needle gauge: 22 G Needle length: 9 cm

## 2016-10-03 NOTE — Op Note (Signed)
NAME:  HOBY, KAWAI                    ACCOUNT NO.:  MEDICAL RECORD NO.:  [id number]  LOCATION:                                 FACILITY:  PHYSICIAN:  Lockie Pares, M.D.    DATE OF BIRTH:  October 21, 1955  DATE OF PROCEDURE:  10/03/2016 DATE OF DISCHARGE:                              OPERATIVE REPORT   PREOPERATIVE DIAGNOSIS:  Severe osteoarthritis, right hip.  POSTOPERATIVE DIAGNOSIS:  Severe osteoarthritis, right hip.  OPERATION:  Right total hip replacement (DePuy AML 13.5 mm small stature stem, 36 mm +8.5 mm neck length ceramic femoral head, 54 mm Gription acetabular shell Sector cup with Pinnacle +4 10-degree liner.  SURGEON:  Lockie Pares, M.D.  ASSISTANT:  Chriss Czar, PA-C  ESTIMATED BLOOD LOSS:  500.  ANESTHESIA:  Spinal anesthetic.  DESCRIPTION OF PROCEDURE:  Lateral positioning with the posterior approach to the hip made, splitting of the gluteus maximus, short external rotators as well as the fascia lata.  T-capsulotomy made in the hip.  We dislocated the hip, coxa magna with severe osteoarthritis was noted.  Head was resected about 1 fingerbreadth above the lesser troch. We then progressively reamed and broached to accept the 13.5 mm small stature stem.  Attention was next directed into the acetabulum.  Acetabular retractors were placed inferiorly, anteriorly with Wing retractors superiorly and posterosuperiorly.  We cleared the acetabular labral tissue and soft tissue within the acetabulum.  Then, medialized the acetabulum down to bleeding bone.  Then, progressively reamed up to a 53 mm diameter to accept a 54 mm cup.  Final cup position was placed with about 45 to 50 degrees of abduction, 15 to 20 degrees of anteversion.  Trial liner was placed.  We broached up the trial and settled on the 8.5 mm neck length. Final components were inserted, the acetabular shell followed by the prosthesis, and then the wing retractors were removed.  We trialed again off  the final prosthesis and settled on the above-mentioned neck length. Stability parameters were as follows:  Maximal flexion adduction with only tendency to dislocate past 60 to 75 degrees of internal rotation.  Leg lengths were judged to be approximately equal. Wound throughout the case was copiously irrigated as well.  Closure was affected with #1 Ethibond, 0 and 2-0 Vicryl, and skin clips.  Lightly compressive sterile dressing was applied, taken to the recovery room in stable condition.     Lockie Pares, M.D.     WDC/MEDQ  D:  10/03/2016  T:  10/03/2016  Job:  [id number]

## 2016-10-03 NOTE — Transfer of Care (Signed)
Immediate Anesthesia Transfer of Care Note  Patient: Thomas Mcclure  Procedure(s) Performed: Procedure(s): TOTAL HIP ARTHROPLASTY (Right)  Patient Location: PACU  Anesthesia Type:MAC and Spinal  Level of Consciousness: drowsy, patient cooperative and responds to stimulation  Airway & Oxygen Therapy: Patient Spontanous Breathing and Patient connected to face mask oxygen  Post-op Assessment: Report given to RN and Post -op Vital signs reviewed and stable  Post vital signs: Reviewed and stable  Last Vitals:  Vitals:   10/03/16 0628 10/03/16 0950  BP: 126/81   Pulse: 92   Resp: 18   Temp: 36.8 C (!) (P) 36.2 C  SpO2: 97%     Last Pain:  Vitals:   10/03/16 0628  TempSrc: Oral  PainSc:          Complications: No apparent anesthesia complications

## 2016-10-03 NOTE — Anesthesia Preprocedure Evaluation (Signed)
Anesthesia Evaluation  Patient identified by MRN, date of birth, ID band Patient awake    Reviewed: Allergy & Precautions, H&P , NPO status , Patient's Chart, lab work & pertinent test results, reviewed documented beta blocker date and time   Airway Mallampati: II  TM Distance: >3 FB Neck ROM: full    Dental   Pulmonary former smoker,    breath sounds clear to auscultation       Cardiovascular hypertension, On Medications, Pt. on medications and Pt. on home beta blockers + CAD and + Past MI   Rhythm:regular     Neuro/Psych  Neuromuscular disease negative psych ROS   GI/Hepatic Neg liver ROS, GERD  Medicated and Controlled,  Endo/Other  diabetes, Type 2, Oral Hypoglycemic Agents  Renal/GU negative Renal ROS  negative genitourinary   Musculoskeletal  (+) Arthritis ,   Abdominal   Peds  Hematology negative hematology ROS (+)   Anesthesia Other Findings See surgeon's H&P   Reproductive/Obstetrics negative OB ROS                             Anesthesia Physical  Anesthesia Plan  ASA: III  Anesthesia Plan: MAC and Spinal   Post-op Pain Management:    Induction:   PONV Risk Score and Plan: 1 and Ondansetron, Midazolam and Treatment may vary due to age or medical condition  Airway Management Planned: Simple Face Mask  Additional Equipment:   Intra-op Plan:   Post-operative Plan:   Informed Consent: I have reviewed the patients History and Physical, chart, labs and discussed the procedure including the risks, benefits and alternatives for the proposed anesthesia with the patient or authorized representative who has indicated his/her understanding and acceptance.   Dental Advisory Given  Plan Discussed with: CRNA and Surgeon  Anesthesia Plan Comments:         Anesthesia Quick Evaluation

## 2016-10-04 LAB — BASIC METABOLIC PANEL
Anion gap: 9 (ref 5–15)
BUN: 15 mg/dL (ref 6–20)
CO2: 26 mmol/L (ref 22–32)
Calcium: 8.5 mg/dL — ABNORMAL LOW (ref 8.9–10.3)
Chloride: 103 mmol/L (ref 101–111)
Creatinine, Ser: 1.09 mg/dL (ref 0.61–1.24)
GFR calc Af Amer: >60 mL/min (ref >60)
GFR calc non Af Amer: >60 mL/min (ref >60)
Glucose, Bld: 155 mg/dL — ABNORMAL HIGH (ref 65–99)
Potassium: 3.3 mmol/L — ABNORMAL LOW (ref 3.5–5.1)
Sodium: 138 mmol/L (ref 135–145)

## 2016-10-04 LAB — GLUCOSE, CAPILLARY
Glucose-Capillary: 159 mg/dL — ABNORMAL HIGH (ref 65–99)
Glucose-Capillary: 140 mg/dL — ABNORMAL HIGH (ref 65–99)
Glucose-Capillary: 157 mg/dL — ABNORMAL HIGH (ref 65–99)
Glucose-Capillary: 148 mg/dL — ABNORMAL HIGH (ref 65–99)

## 2016-10-04 LAB — CBC
HCT: 31.6 % — ABNORMAL LOW (ref 39.0–52.0)
Hemoglobin: 10.2 g/dL — ABNORMAL LOW (ref 13.0–17.0)
MCH: 26.2 pg (ref 26.0–34.0)
MCHC: 32.3 g/dL (ref 30.0–36.0)
MCV: 81 fL (ref 78.0–100.0)
Platelets: 258 10*3/uL (ref 150–400)
RBC: 3.9 MIL/uL — ABNORMAL LOW (ref 4.22–5.81)
RDW: 15.1 % (ref 11.5–15.5)
WBC: 9.2 10*3/uL (ref 4.0–10.5)

## 2016-10-04 MED ORDER — POTASSIUM CHLORIDE CRYS ER 20 MEQ PO TBCR
20.0000 meq | EXTENDED_RELEASE_TABLET | Freq: Two times a day (BID) | ORAL | Status: DC
  Administered 2016-10-04 – 2016-10-05 (×2): 20 meq via ORAL
  Filled 2016-10-04 (×2): qty 1

## 2016-10-04 NOTE — Evaluation (Signed)
Physical Therapy Evaluation Patient Details Name: Thomas Mcclure MRN: [id number] DOB: 1956-01-24 Today's Date: 10/04/2016   History of Present Illness  Pt is a 61 yo male admitted 8/10 for elective L post THA. PMH: CAD, HTN, GERD, obesity, CAD, NSTEMI, chronic back pain. PSH: back surgery, spinal cord stimulator insertion, cardiac cath.  Clinical Impression  Pt with improved ability to complete quad set and LAQ and demo'd improved ambulation tolerance. Acute PT to con't to follow.    Follow Up Recommendations Home health PT;Supervision/Assistance - 24 hour    Equipment Recommendations  None recommended by PT    Recommendations for Other Services       Precautions / Restrictions Precautions Precautions: Posterior Hip;Fall Precaution Booklet Issued: Yes (comment) Precaution Comments: pt with recall of 3/3 hip precautions but requires v/c's to adhere to them functionally Restrictions Weight Bearing Restrictions: Yes RLE Weight Bearing: Weight bearing as tolerated      Mobility  Bed Mobility Overal bed mobility: Needs Assistance Bed Mobility: Supine to Sit     Supine to sit: Mod assist     General bed mobility comments: minA for R LE management and modA for trunk eelvation due to body habitus  Transfers Overall transfer level: Needs assistance Equipment used: Rolling walker (2 wheeled) Transfers: Sit to/from Stand Sit to Stand: Min assist         General transfer comment: v/c's for safe hand placement  Ambulation/Gait Ambulation/Gait assistance: Min guard Ambulation Distance (Feet): 60 Feet Assistive device: Rolling walker (2 wheeled) Gait Pattern/deviations: Step-to pattern;Step-through pattern Gait velocity: slow Gait velocity interpretation: Below normal speed for age/gender General Gait Details: pt initially step to but transitioned to step through gait pattern v/c for sequencing to optimize fluidity of gait pattern  Stairs            Wheelchair  Mobility    Modified Rankin (Stroke Patients Only)       Balance Overall balance assessment: Needs assistance         Standing balance support: Single extremity supported Standing balance-Leahy Scale: Poor Standing balance comment: pt dependent on RW                             Pertinent Vitals/Pain Pain Assessment: 0-10 Pain Score: 5  Pain Location: R hip Pain Descriptors / Indicators: Constant;Dull;Aching Pain Intervention(s): Monitored during session    Home Living                        Prior Function                 Hand Dominance        Extremity/Trunk Assessment                Communication      Cognition Arousal/Alertness: Awake/alert Behavior During Therapy: WFL for tasks assessed/performed Overall Cognitive Status: Within Functional Limits for tasks assessed                                        General Comments      Exercises Total Joint Exercises Ankle Circles/Pumps: AROM;Both;10 reps;Supine Quad Sets: AROM;Right;10 reps;Supine Long Arc Quad: AROM;Right;10 reps;Seated   Assessment/Plan    PT Assessment    PT Problem List         PT Treatment Interventions  PT Goals (Current goals can be found in the Care Plan section)  Acute Rehab PT Goals Patient Stated Goal: home    Frequency 7X/week   Barriers to discharge        Co-evaluation               AM-PAC PT "6 Clicks" Daily Activity  Outcome Measure Difficulty turning over in bed (including adjusting bedclothes, sheets and blankets)?: Total Difficulty moving from lying on back to sitting on the side of the bed? : Total Difficulty sitting down on and standing up from a chair with arms (e.g., wheelchair, bedside commode, etc,.)?: A Lot Help needed moving to and from a bed to chair (including a wheelchair)?: A Little Help needed walking in hospital room?: A Little Help needed climbing 3-5 steps with a railing? : A Lot 6  Click Score: 12    End of Session Equipment Utilized During Treatment: Gait belt Activity Tolerance: Patient tolerated treatment well Patient left: in chair;with call bell/phone within reach;with family/visitor present;with nursing/sitter in room Nurse Communication: Mobility status PT Visit Diagnosis: Difficulty in walking, not elsewhere classified (R26.2);Pain Pain - Right/Left: Right Pain - part of body: Hip    Time: 4540-9811 PT Time Calculation (min) (ACUTE ONLY): 20 min   Charges:     PT Treatments $Gait Training: 8-22 mins   PT G Codes:        Kittie Plater, PT, DPT Pager #: [phone] Office #: [phone]   [place] 10/04/2016, 3:22 PM

## 2016-10-04 NOTE — Progress Notes (Signed)
Patient ID: Thomas Mcclure, male   DOB: 1955-03-18, 61 y.o.   MRN: [id number]     Subjective:  Patient reports pain as mild to moderate.  Patient in the chair and in no acute distress.  Objective:   VITALS:   Vitals:   10/03/16 1900 10/04/16 0500 10/04/16 0704 10/04/16 0932  BP: (!) 96/56 113/69  119/72  Pulse: 77 100  (!) 106  Resp: 14 18  18   Temp: 99.3 F (37.4 C) (!) 101.1 F (38.4 C) 99.5 F (37.5 C)   TempSrc: Oral Oral Oral   SpO2: 96% 96%  94%    ABD soft Sensation intact distally Dorsiflexion/Plantar flexion intact Incision: dressing C/D/I and no drainage   Lab Results  Component Value Date   WBC 9.2 10/04/2016   HGB 10.2 (L) 10/04/2016   HCT 31.6 (L) 10/04/2016   MCV 81.0 10/04/2016   PLT 258 10/04/2016   BMET    Component Value Date/Time   NA 138 10/04/2016 0409   NA 141 05/13/2016 0831   K 3.3 (L) 10/04/2016 0409   CL 103 10/04/2016 0409   CO2 26 10/04/2016 0409   GLUCOSE 155 (H) 10/04/2016 0409   BUN 15 10/04/2016 0409   BUN 11 05/13/2016 0831   CREATININE 1.09 10/04/2016 0409   CREATININE 0.89 02/13/2016 0931   CALCIUM 8.5 (L) 10/04/2016 0409   GFRNONAA >60 10/04/2016 0409   GFRAA >60 10/04/2016 0409     Assessment/Plan: 1 Day Post-Op   Active Problems:   Primary localized osteoarthritis of right hip   Advance diet Up with therapy Plan for discharge tomorrow WBAT Dry dressing PRN   Remonia Richter 10/04/2016, 10:44 AM  Discussed and agree with above.  IS and tylenol for elevated temps.  Will monitor.    Marchia Bond, MD Cell [phone]

## 2016-10-04 NOTE — Discharge Instructions (Signed)
Information on my medicine - ELIQUIS® (apixaban) ° °This medication education was reviewed with me or my healthcare representative as part of my discharge preparation.  The pharmacist that spoke with me during my hospital stay was:  Jeorgia Helming Dien, RPH ° °Why was Eliquis® prescribed for you? °Eliquis® was prescribed for you to reduce the risk of blood clots forming after orthopedic surgery.   ° °What do You need to know about Eliquis®? °Take your Eliquis® TWICE DAILY - one tablet in the morning and one tablet in the evening with or without food.  It would be best to take the dose about the same time each day. ° °If you have difficulty swallowing the tablet whole please discuss with your pharmacist how to take the medication safely. ° °Take Eliquis® exactly as prescribed by your doctor and DO NOT stop taking Eliquis® without talking to the doctor who prescribed the medication.  Stopping without other medication to take the place of Eliquis® may increase your risk of developing a clot. ° °After discharge, you should have regular check-up appointments with your healthcare provider that is prescribing your Eliquis®. ° °What do you do if you miss a dose? °If a dose of ELIQUIS® is not taken at the scheduled time, take it as soon as possible on the same day and twice-daily administration should be resumed.  The dose should not be doubled to make up for a missed dose.  Do not take more than one tablet of ELIQUIS at the same time. ° °Important Safety Information °A possible side effect of Eliquis® is bleeding. You should call your healthcare provider right away if you experience any of the following: °? Bleeding from an injury or your nose that does not stop. °? Unusual colored urine (red or dark brown) or unusual colored stools (red or black). °? Unusual bruising for unknown reasons. °? A serious fall or if you hit your head (even if there is no bleeding). ° °Some medicines may interact with Eliquis® and might increase  your risk of bleeding or clotting while on Eliquis®. To help avoid this, consult your healthcare provider or pharmacist prior to using any new prescription or non-prescription medications, including herbals, vitamins, non-steroidal anti-inflammatory drugs (NSAIDs) and supplements. ° °This website has more information on Eliquis® (apixaban): http://www.eliquis.com/eliquis/home ° °

## 2016-10-04 NOTE — Plan of Care (Signed)
Problem: Acute Rehab PT Goals(only PT should resolve) Goal: Pt/caregiver will Perform Home Exercise Program Handout provided for HEP

## 2016-10-04 NOTE — Evaluation (Signed)
Physical Therapy Evaluation Patient Details Name: Thomas Mcclure MRN: [id number] DOB: Nov 12, 1955 Today's Date: 10/04/2016   History of Present Illness  Pt is a 61 yo male admitted 8/10 for elective L post THA. PMH: CAD, HTN, GERD, obesity, CAD, NSTEMI, chronic back pain. PSH: back surgery, spinal cord stimulator insertion, cardiac cath.  Clinical Impression  Pt is s/p TKA resulting in the deficits listed below (see PT Problem List). Pt with increased pain limiting OOB mobility.  Pt will benefit from skilled PT to increase their independence and safety with mobility to allow discharge to the venue listed below.      Follow Up Recommendations Home health PT;Supervision/Assistance - 24 hour    Equipment Recommendations  None recommended by PT    Recommendations for Other Services       Precautions / Restrictions Precautions Precautions: Posterior Hip;Fall Precaution Booklet Issued: Yes (comment) Precaution Comments: pt with recall of 2/3 precautions at end of session. pt re-educated. pt given HEP Required Braces or Orthoses: Knee Immobilizer - Right Knee Immobilizer - Right:  (at night) Restrictions Weight Bearing Restrictions: Yes RLE Weight Bearing: Weight bearing as tolerated      Mobility  Bed Mobility Overal bed mobility: Needs Assistance Bed Mobility: Supine to Sit     Supine to sit: Mod assist;Max assist     General bed mobility comments: assist for R LE, assist for trunk elevation, max v/c's for long sit technique  Transfers Overall transfer level: Needs assistance Equipment used: Rolling walker (2 wheeled) Transfers: Sit to/from Stand Sit to Stand: Min assist         General transfer comment: v/c's for safe hand placement, v/c's to minimize trunk/hip bending, increased time  Ambulation/Gait Ambulation/Gait assistance: Min assist Ambulation Distance (Feet): 20 Feet Assistive device: Rolling walker (2 wheeled) Gait Pattern/deviations: Step-to  pattern;Antalgic Gait velocity: slow Gait velocity interpretation: Below normal speed for age/gender General Gait Details: max encouragement to participate, slow antalgic gait pattern, v/c's to lead with R LE. minimal R LE foot clearance  Stairs            Wheelchair Mobility    Modified Rankin (Stroke Patients Only)       Balance Overall balance assessment: Needs assistance         Standing balance support: Single extremity supported Standing balance-Leahy Scale: Poor Standing balance comment: pt stood at toliet to urinate, had to hold onto walker with 1 hand                             Pertinent Vitals/Pain Pain Assessment: 0-10 Pain Score: 7  Pain Location: R hip Pain Descriptors / Indicators: Sharp Pain Intervention(s): RN gave pain meds during session;Patient requesting pain meds-RN notified    Home Living Family/patient expects to be discharged to:: Private residence Living Arrangements: Spouse/significant other Available Help at Discharge: Family;Available 24 hours/day Type of Home: House Home Access: Stairs to enter Entrance Stairs-Rails: Can reach both Entrance Stairs-Number of Steps: 3 Home Layout: One level Home Equipment: Walker - 2 wheels;Grab bars - toilet;Toilet riser;Grab bars - tub/shower      Prior Function Level of Independence: Independent with assistive device(s)         Comments: pt has been using RW recently due to back and hip pain. 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"     Hand Dominance   Dominant Hand: Right    Extremity/Trunk Assessment   Upper Extremity Assessment Upper Extremity Assessment:  Overall WFL for tasks assessed    Lower Extremity Assessment Lower Extremity Assessment: RLE deficits/detail RLE Deficits / Details: able to initiate quad set, tolerated AAROM of R hip/knee ROM to 30 deg    Cervical / Trunk Assessment Cervical / Trunk Assessment: Other exceptions Cervical / Trunk Exceptions: h/o back surgery   Communication   Communication: No difficulties  Cognition Arousal/Alertness: Awake/alert Behavior During Therapy: WFL for tasks assessed/performed Overall Cognitive Status: Within Functional Limits for tasks assessed                                 General Comments: initially sleepy but easily awoken and participated in PT      General Comments      Exercises Total Joint Exercises Ankle Circles/Pumps: AROM;Both;10 reps;Supine Quad Sets: AROM;Right;10 reps;Supine Gluteal Sets: AROM;Both;5 reps;Supine   Assessment/Plan    PT Assessment Patient needs continued PT services  PT Problem List Decreased strength;Decreased range of motion;Decreased activity tolerance;Decreased balance;Decreased mobility;Decreased coordination;Decreased cognition;Decreased knowledge of use of DME;Decreased safety awareness;Pain       PT Treatment Interventions DME instruction;Gait training;Stair training;Functional mobility training;Therapeutic activities;Therapeutic exercise;Balance training    PT Goals (Current goals can be found in the Care Plan section)  Acute Rehab PT Goals Patient Stated Goal: home PT Goal Formulation: With patient Time For Goal Achievement: 10/11/16 Potential to Achieve Goals: Good    Frequency 7X/week   Barriers to discharge        Co-evaluation               AM-PAC PT "6 Clicks" Daily Activity  Outcome Measure Difficulty turning over in bed (including adjusting bedclothes, sheets and blankets)?: Total Difficulty moving from lying on back to sitting on the side of the bed? : Total Difficulty sitting down on and standing up from a chair with arms (e.g., wheelchair, bedside commode, etc,.)?: A Lot Help needed moving to and from a bed to chair (including a wheelchair)?: A Little Help needed walking in hospital room?: A Little Help needed climbing 3-5 steps with a railing? : A Lot 6 Click Score: 12    End of Session Equipment Utilized During  Treatment: Gait belt Activity Tolerance: Patient limited by pain Patient left: in chair;with call bell/phone within reach;with family/visitor present;with nursing/sitter in room Nurse Communication: Mobility status PT Visit Diagnosis: Difficulty in walking, not elsewhere classified (R26.2);Pain Pain - Right/Left: Right Pain - part of body: Hip    Time: 0830-0921 PT Time Calculation (min) (ACUTE ONLY): 51 min   Charges:   PT Evaluation $PT Eval Moderate Complexity: 1 Mod PT Treatments $Gait Training: 8-22 mins $Therapeutic Exercise: 8-22 mins   PT G Codes:        Kittie Plater, PT, DPT Pager #: [phone] Office #: [phone]   Scotti Motter M Rashon Westrup 10/04/2016, 9:42 AM

## 2016-10-04 NOTE — Progress Notes (Signed)
Pt had temp of 102.4 at 1800. Gave tylenol, and notified on call MD.  No new orders received. Will continue to monitor for any changes.

## 2016-10-04 NOTE — Care Management Note (Signed)
Case Management Note  Patient Details  Name: Thomas Mcclure MRN: [id number] Date of Birth: [birth year]  Subjective/Objective:  61 yr old gentleman s/p right total hip arhroplasty.                  Action/Plan: Case manager spoke with patient and wife concerning discharge plan. Patient in lot of pain. Per therapist Ashly patient may need shortterm rehab at [facility]. Patient was preoperatively setup for Home Health services with [facility]. CM will continue to monitor. He has RW and 3in1 at home.    Expected Discharge Date:   pending               Expected Discharge Plan:  [place]  In-House Referral:  NA  Discharge planning Services  CM Consult  Post Acute Care Choice:  Home Health Choice offered to:  Spouse, Patient  DME Arranged:  N/A (has RW and 3in1) DME Agency:  NA  HH Arranged:  PT [place] Agency:  [facility] (formerly [employer])  Status of Service:  In process, will continue to follow  If discussed at Long Length of Stay Meetings, dates discussed:    Additional Comments:  Ninfa Meeker, RN 10/04/2016, 10:54 AM

## 2016-10-05 LAB — CBC
HCT: 32 % — ABNORMAL LOW (ref 39.0–52.0)
Hemoglobin: 10.4 g/dL — ABNORMAL LOW (ref 13.0–17.0)
MCH: 26.4 pg (ref 26.0–34.0)
MCHC: 32.5 g/dL (ref 30.0–36.0)
MCV: 81.2 fL (ref 78.0–100.0)
Platelets: 239 10*3/uL (ref 150–400)
RBC: 3.94 MIL/uL — ABNORMAL LOW (ref 4.22–5.81)
RDW: 15.3 % (ref 11.5–15.5)
WBC: 12.8 10*3/uL — ABNORMAL HIGH (ref 4.0–10.5)

## 2016-10-05 LAB — GLUCOSE, CAPILLARY
Glucose-Capillary: 150 mg/dL — ABNORMAL HIGH (ref 65–99)
Glucose-Capillary: 135 mg/dL — ABNORMAL HIGH (ref 65–99)
Glucose-Capillary: 129 mg/dL — ABNORMAL HIGH (ref 65–99)

## 2016-10-05 NOTE — Discharge Summary (Signed)
Physician Discharge Summary  Patient ID: Thomas Mcclure MRN: [id number] DOB/AGE: October 14, 1955 61 y.o.  Admit date: 10/03/2016 Discharge date: 10/05/2016  Admission Diagnoses:  Primary localized osteoarthritis of the right hip  Discharge Diagnoses:  Active Problems:   Primary localized osteoarthritis of right hip   Past Medical History:  Diagnosis Date  . Arthritis    Rheumatoid  . Baker's cyst    Left calf  . CAD in native artery, 07/15/15 PCI of RCA with DES 07/16/2015   a.   NSTEMI 5/17: LHC - pLAD 20, pLCx 20, OM1 30, pRCA 100, EF 45-50%>> PCI: 2.5 x 24 mm Promus DES to RCA  //  b.   Echo 5/17: mild LVH, EF 50-55%, no RWMA, mod RVE //  c. LHC 6/17: pLAD 20, pLCx 20, OM1 50, pRCA stent ok, EF 35-45% with mod sized inf wall and basal segment aneurysm  . Chronic back pain   . Cyst (solitary) of breast    ? cyst left calf ,knee  . Diabetes mellitus    diet controlled  . GERD (gastroesophageal reflux disease)   . Hyperlipidemia   . Hypertension    Dr. [name] [phone]  . Ischemic cardiomyopathy    a. LV-gram at time of LHC in 6/17 with EF 35-45%  //  b. Echo 7/17: EF 45-50%, inferior HK, grade 1 diastolic dysfunction, mildly dilated aortic root, moderately reduced RVSF, mild RAE  . Myocardial infarction ([place]) 2017  . 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 male who was admitted 10/03/2016 with a diagnosis of <principal problem not specified> and went to the operating room on 10/03/2016 and underwent the above named procedures.    He was given perioperative antibiotics:  Anti-infectives    Start     Dose/Rate Route Frequency Ordered Stop   10/03/16 1400  ceFAZolin (ANCEF) IVPB 1 g/50 mL premix     1 g 100 mL/hr over 30 Minutes Intravenous Every 6 hours 10/03/16 1308 10/03/16 2049    10/03/16 0700  ceFAZolin (ANCEF) 3 g in dextrose 5 % 50 mL IVPB     3 g 130 mL/hr over 30 Minutes Intravenous To [facility]Stay [facility] 10/02/16 1313 10/03/16 1429    .  He was given sequential compression devices, early ambulation, and eliquis for DVT prophylaxis.  He benefited maximally from the hospital stay and there were no complications.    Recent vital signs:  Vitals:   10/05/16 0500 10/05/16 1516  BP: 116/71 111/67  Pulse: 96 87  Resp: 18 18  Temp: 99 F (37.2 C) 98.7 F (37.1 C)  SpO2: 99% (!) 81%    Recent laboratory studies:  Lab Results  Component Value Date   HGB 10.4 (L) 10/05/2016   HGB 10.2 (L) 10/04/2016   HGB 12.4 (L) 09/22/2016   Lab Results  Component Value Date   WBC 12.8 (H) 10/05/2016   PLT 239 10/05/2016   Lab Results  Component Value Date   INR 0.95 09/22/2016   Lab Results  Component Value Date   NA 138 10/04/2016   K 3.3 (L) 10/04/2016   CL 103 10/04/2016   CO2 26 10/04/2016   BUN 15 10/04/2016   CREATININE 1.09 10/04/2016   GLUCOSE 155 (H) 10/04/2016    Discharge Medications:   Allergies as of 10/05/2016  Reactions   Brilinta [ticagrelor] Shortness Of Breath, Other (See Comments)   CHEST PAIN    Methylprednisolone Other (See Comments)   DIAPHORESIS HYPOTENSION   Prednisone Other (See Comments)   DIAPHORESIS HYPOTENSION   Toradol [ketorolac Tromethamine] Other (See Comments)   DIAPHORESIS HYPOTENSION   Adhesive [tape] Rash, Other (See Comments)   "blisters" ( NO SURGICAL TAPE, PLEASE)      Medication List    STOP taking these medications   ciprofloxacin 500 MG tablet Commonly known as:  CIPRO   clopidogrel 75 MG tablet Commonly known as:  PLAVIX   famotidine 20 MG tablet Commonly known as:  PEPCID   hydrOXYzine 25 MG tablet Commonly known as:  ATARAX/VISTARIL     TAKE these medications   apixaban 2.5 MG Tabs tablet Commonly known as:  ELIQUIS Take 1 tablet (2.5 mg total) by mouth 2 (two) times daily.    aspirin EC 81 MG tablet Take 1 tablet (81 mg total) by mouth daily.   atorvastatin 80 MG tablet Commonly known as:  LIPITOR TAKE 1 TABLET BY MOUTH EVERY DAY AT 6PM   chlorthalidone 25 MG tablet Commonly known as:  HYGROTON Take 1 tablet (25 mg total) by mouth daily.   cyclobenzaprine 10 MG tablet Commonly known as:  FLEXERIL Take 10 mg by mouth daily as needed for muscle spasms.   diazepam 10 MG tablet Commonly known as:  VALIUM Take 10 mg by mouth every evening. May take an additional 10 mg daily as needed for muscle spasms   DILT-XR 180 MG 24 hr capsule Generic drug:  diltiazem Take 180 mg by mouth once daily   DULoxetine 60 MG capsule Commonly known as:  CYMBALTA Take 60 mg by mouth daily.   lisinopril 20 MG tablet Commonly known as:  PRINIVIL,ZESTRIL Take 20 mg by mouth every evening.   metFORMIN 750 MG 24 hr tablet Commonly known as:  GLUCOPHAGE-XR Take 750 mg by mouth daily with supper.   multivitamin with minerals Tabs tablet Take 1 tablet by mouth daily.   nitroGLYCERIN 0.4 MG SL tablet Commonly known as:  NITROSTAT PLACE 1 TABLET UNDER THE TONGUE EVERY 5 MINUTES AS NEEDED FOR CHEST PAIN. CALL 911 AT THIRD DOSE WITHIN 15 MINUTES   omeprazole 40 MG capsule Commonly known as:  PRILOSEC Take 40 mg by mouth daily.   Oxycodone HCl 10 MG Tabs Take 1 tablet (10 mg total) by mouth every 4 (four) hours as needed (pain). scheduled What changed:  when to take this  reasons to take this   polyethylene glycol powder powder Commonly known as:  GLYCOLAX/MIRALAX Take 17 g by mouth daily as needed (constipation). Mix in 8 oz water, juice, soda, coffee or tea and drink   potassium chloride SA 20 MEQ tablet Commonly known as:  K-DUR,KLOR-CON Take 1 tablet (20 mEq total) by mouth daily.   pregabalin 100 MG capsule Commonly known as:  LYRICA Take 100-200 mg by mouth 2 (two) times daily. Take 100 mg in the morning and 200 mg at night   traMADol 50 MG  tablet Commonly known as:  ULTRAM Take 50 mg by mouth 3 (three) times daily. scheduled   triamcinolone ointment 0.5 % Commonly known as:  KENALOG Apply 1 application topically 2 (two) times daily.            Durable Medical Equipment        Start     Ordered   10/03/16 1309  DME 3 n 1  Once  10/03/16 1308      Diagnostic Studies: Dg Chest 2 View  Result Date: 09/22/2016 CLINICAL DATA:  Preoperative chest evaluation. Coronary artery disease. EXAM: CHEST  2 VIEW COMPARISON:  04/30/2016 FINDINGS: Heart size is normal. Mediastinal shadows are normal, with the presence of neuro stimulators overlying the midline, 1 ventral and 1 dorsal. The lungs are clear. The vascularity is normal. No effusions. No acute bone finding. IMPRESSION: No active cardiopulmonary disease. Thoracic neurostimulators, 1 ventral and 1 dorsal in the midline. Electronically Signed   By: Nelson Chimes M.D.   On: 09/22/2016 13:58   Dg Hip Port Unilat With Pelvis 1v Right  Result Date: 10/03/2016 CLINICAL DATA:  Status post right hip replacement EXAM: DG HIP (WITH OR WITHOUT PELVIS) 1V PORT RIGHT COMPARISON:  None. FINDINGS: Pelvic ring is intact. Right hip prosthesis is seen. No acute bony or soft tissue abnormality is noted. IMPRESSION: Status post right hip replacement Electronically Signed   By: Inez Catalina M.D.   On: 10/03/2016 10:29    Disposition: 01-Home or Self Care    Follow-up Information    [name], MD. Schedule an appointment as soon as possible for a visit in 2 weeks.   Specialty:  Orthopedic Surgery Contact information: [place] [address] [phone]            Signed: Johnny Bridge 10/05/2016, 5:24 PM

## 2016-10-05 NOTE — Evaluation (Signed)
Occupational Therapy Evaluation Patient Details Name: Thomas Mcclure MRN: [id number] DOB: 1955-10-17 Today's Date: 10/05/2016    History of Present Illness Pt is a 62 yo male admitted 8/10 for elective R post THA. PMH: CAD, HTN, GERD, obesity, CAD, NSTEMI, chronic back pain. PSH: back surgery, spinal cord stimulator insertion, cardiac cath.   Clinical Impression   PTA, pt was living with his wife and was independent. Currently, pt requires Mod A for LB ADLs with AE and Min A for functional mobility using RW. Provided education on posterior hip precautions and LB ADLs with AE. Feel pt would benefit from further OT to increase his functional performance and facilitate safe dc home. Recommend dc home with HHOT to increase safety and independence with ADLs due to pt's poor functional performance and recall of precautions.     Follow Up Recommendations  Home health OT;Supervision/Assistance - 24 hour    Equipment Recommendations  None recommended by OT    Recommendations for Other Services PT consult     Precautions / Restrictions Precautions Precautions: Posterior Hip;Fall Precaution Booklet Issued: Yes (comment) Precaution Comments: Able to recall 2/3 precautions, unable to remember crossing legs Required Braces or Orthoses: Knee Immobilizer - Right Knee Immobilizer - Right:  (at night) Restrictions Weight Bearing Restrictions: Yes RLE Weight Bearing: Weight bearing as tolerated      Mobility Bed Mobility               General bed mobility comments: Pt at EOB with wife upon arrival. Pt needing to use the restroom  Transfers Overall transfer level: Needs assistance Equipment used: Rolling walker (2 wheeled) Transfers: Sit to/from Stand Sit to Stand: Min assist;From elevated surface         General transfer comment: bed elevated, v/c's for hand placement, increased time    Balance Overall balance assessment: Needs assistance Sitting-balance support: No upper  extremity supported;Feet supported Sitting balance-Leahy Scale: Fair Sitting balance - Comments: Performed LB dressing with AE   Standing balance support: Bilateral upper extremity supported;During functional activity Standing balance-Leahy Scale: Poor Standing balance comment: dependent on RW                           ADL either performed or assessed with clinical judgement   ADL Overall ADL's : Needs assistance/impaired Eating/Feeding: Set up;Sitting   Grooming: Wash/dry hands;Minimal assistance;Standing Grooming Details (indicate cue type and reason): Min A for safety in standing at sink Upper Body Bathing: Set up;Sitting   Lower Body Bathing: Moderate assistance;With adaptive equipment;Sit to/from stand   Upper Body Dressing : Minimal assistance;Sitting Upper Body Dressing Details (indicate cue type and reason): donned new gown with min a Lower Body Dressing: Moderate assistance;With adaptive equipment Lower Body Dressing Details (indicate cue type and reason): Pt doffed/donned socks with AE - required Min A to lift R heel off ground. Pt requiring Mod A for donning underwear and would verbalize frustration with task difficulty. Required Max verbal encouragment. Pt declined to stand for dressing tasks stating "When I get dress, I am going home." Toilet Transfer: Minimal assistance;Ambulation;Cueing for sequencing;Adhering to hip precautions;RW (Simulated to recliner)   Toileting- Clothing Manipulation and Hygiene: Minimal assistance (peri care after urination at toilet while standing) Toileting - Clothing Manipulation Details (indicate cue type and reason): Standing at toilet, pt performed peri care with Min A for standing balance   Tub/Shower Transfer Details (indicate cue type and reason): Will need education on tub transfer Functional mobility  during ADLs: Minimal assistance;Rolling walker;Cueing for sequencing;Cueing for safety General ADL Comments: Pt demosntrating  decreased fucntional performance. Requiring Mod A for LB ADLs and Min A for functional mobility with cues to bring RW closer for UE support.     Vision         Perception     Praxis      Pertinent Vitals/Pain Pain Assessment: 0-10 Pain Score: 8  Pain Location: R knee radiating to R hip Pain Descriptors / Indicators: Constant Pain Intervention(s): Monitored during session;Limited activity within patient's tolerance;Repositioned     Hand Dominance Right   Extremity/Trunk Assessment Upper Extremity Assessment Upper Extremity Assessment: Overall WFL for tasks assessed   Lower Extremity Assessment Lower Extremity Assessment: Defer to PT evaluation   Cervical / Trunk Assessment Cervical / Trunk Assessment: Other exceptions Cervical / Trunk Exceptions: h/o back surgery   Communication Communication Communication: No difficulties   Cognition Arousal/Alertness: Awake/alert Behavior During Therapy: WFL for tasks assessed/performed Overall Cognitive Status: Within Functional Limits for tasks assessed                                 General Comments: pt self limiting and requires max encouragement to participate with OOB mobility   General Comments  Pt wife, Thomas Mcclure, present for session    Exercises     Shoulder Instructions      Home Living Family/patient expects to be discharged to:: Private residence Living Arrangements: Spouse/significant other Available Help at Discharge: Family;Available 24 hours/day Type of Home: House Home Access: Stairs to enter [employer] of Steps: 3 Entrance Stairs-Rails: Can reach both Home Layout: One level     Bathroom Shower/Tub: Teacher, early years/pre: Handicapped height (has toliet riser)     Home Equipment: Walker - 2 wheels;Grab bars - toilet;Toilet riser;Grab bars - tub/shower;Shower seat   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        OT Problem List: Decreased strength;Decreased range of motion;Decreased activity tolerance;Impaired balance (sitting and/or standing);Decreased safety awareness;Decreased knowledge of use of DME or AE;Decreased knowledge of precautions;Pain      OT Treatment/Interventions: Self-care/ADL training;Therapeutic exercise;Energy conservation;Manual therapy;Therapeutic activities;Patient/family education    OT Goals(Current goals can be found in the care plan section) Acute Rehab OT Goals Patient Stated Goal: home OT Goal Formulation: With patient Time For Goal Achievement: 10/19/16 Potential to Achieve Goals: Good ADL Goals Pt Will Perform Lower Body Bathing: with min guard assist;sit to/from stand;with adaptive equipment Pt Will Perform Lower Body Dressing: with min guard assist;sit to/from stand;with adaptive equipment Pt Will Transfer to Toilet: with min guard assist;bedside commode;ambulating Pt Will Perform Toileting - Clothing Manipulation and hygiene: with min guard assist;sit to/from stand Pt Will Perform Tub/Shower Transfer: Tub transfer;shower seat;ambulating;rolling walker;with min assist  OT Frequency: Min 2X/week   Barriers to D/C:            Co-evaluation              AM-PAC PT "6 Clicks" Daily Activity     Outcome Measure Help from another person eating meals?: None Help from another person taking care of personal grooming?: A Little Help from another person toileting, which includes using toliet, bedpan, or urinal?: A  Little Help from another person bathing (including washing, rinsing, drying)?: A Lot Help from another person to put on and taking off regular upper body clothing?: A Little Help from another person to put on and taking off regular lower body clothing?: A Lot 6 Click Score: 17   End of  Session Equipment Utilized During Treatment: Gait belt;Rolling walker Nurse Communication: Mobility status  Activity Tolerance: Patient limited by pain;Patient limited by fatigue Patient left: in chair;with call bell/phone within reach;with family/visitor present  OT Visit Diagnosis: Unsteadiness on feet (R26.81);Other abnormalities of gait and mobility (R26.89);Muscle weakness (generalized) (M62.81);Pain Pain - Right/Left: Left Pain - part of body: Hip;Knee                Time: 9147-8295 OT Time Calculation (min): 33 min Charges:  OT General Charges $OT Visit: 1 Procedure OT Evaluation $OT Eval Low Complexity: 1 Procedure OT Treatments $Self Care/Home Management : 8-22 mins G-Codes:     [place], OTR/L Acute Rehab Pager: [phone] Office: [place] 10/05/2016, 11:50 AM

## 2016-10-05 NOTE — Progress Notes (Signed)
Physician in for late discharge. Pt desires to discharge this pm. Arrangements made by Case Mgr with [facility] for home PT services. Patient has DME already in the home. (Rolling walker and shower chair along with hand rails on side of toilet and shower.)  Advised patient to call [facility] in the am to alert them that he has discharged this date.  Pt states he will need a "grab it" or "reacher" from [facility] servil[facility].  All personal belongings with pt. Discharge instructions and rx reviewed with patient along with follow up appts.  Pt demonstrates no distress or c/o. Aquacel dressing intact to operative hip. Wife will escort home.

## 2016-10-05 NOTE — Progress Notes (Addendum)
Physical Therapy Treatment Patient Details Name: Thomas Mcclure MRN: [id number] DOB: 1955/07/24 Today's Date: 10/05/2016    History of Present Illness Pt is a 61 yo male admitted 8/10 for elective R post THA. PMH: CAD, HTN, GERD, obesity, CAD, NSTEMI, chronic back pain. PSH: back surgery, spinal cord stimulator insertion, cardiac cath.    PT Comments    Pt con't to require max encouragement to participate in PT due to R hip pain. Pt with improved ambulation tolerance however requires max encouragement, education, and freq standing rest breaks. Getting out of bed con't to require mod/maxA. Acute PT to follow.   Follow Up Recommendations  Home health PT;Supervision/Assistance - 24 hour     Equipment Recommendations  None recommended by PT    Recommendations for Other Services       Precautions / Restrictions Precautions Precautions: Posterior Hip;Fall Precaution Booklet Issued: Yes (comment) Precaution Comments: pt able to recall 2/3, unable to state 90 deg of hip flex Restrictions Weight Bearing Restrictions: Yes RLE Weight Bearing: Weight bearing as tolerated    Mobility  Bed Mobility Overal bed mobility: Needs Assistance Bed Mobility: Supine to Sit     Supine to sit: Mod assist     General bed mobility comments: max directional v/c's, minA for R LE management, modA for trunk elevation. due to body habitus and R hip pain pt with increased difficulty with long sit technique for OOB mobiltiy  Transfers Overall transfer level: Needs assistance Equipment used: Rolling walker (2 wheeled) Transfers: Sit to/from Stand Sit to Stand: Min assist         General transfer comment: bed elevated, v/c's for hand placement, increased time  Ambulation/Gait Ambulation/Gait assistance: Min guard Ambulation Distance (Feet): 100 Feet Assistive device: Rolling walker (2 wheeled) Gait Pattern/deviations: Step-through pattern Gait velocity: slow Gait velocity interpretation:  Below normal speed for age/gender General Gait Details: v/c's to increase R step length, pt with freq standing rest breaks, v/c's for sequencing initially, max encouragement to increase amb distance   Stairs            Wheelchair Mobility    Modified Rankin (Stroke Patients Only)       Balance Overall balance assessment: Needs assistance Sitting-balance support: Bilateral upper extremity supported Sitting balance-Leahy Scale: Fair Sitting balance - Comments: dependent on bilat UEs due to R hip pain and bending precautions   Standing balance support: Bilateral upper extremity supported Standing balance-Leahy Scale: Poor Standing balance comment: dependent on RW                            Cognition Arousal/Alertness: Awake/alert Behavior During Therapy: WFL for tasks assessed/performed Overall Cognitive Status: Within Functional Limits for tasks assessed                                 General Comments: pt self limiting and requires max encouragement to participate with OOB mobility      Exercises Total Joint Exercises Ankle Circles/Pumps: AROM;Both;10 reps;Supine Quad Sets: AROM;Right;10 reps;Supine Heel Slides: AAROM;Right;10 reps;Supine Goniometric ROM: 5-45 active R hip flex    General Comments        Pertinent Vitals/Pain Pain Assessment: 0-10 Pain Score: 10-Worst pain ever Pain Location: R knee radiating to R hip Pain Descriptors / Indicators: Constant Pain Intervention(s): Monitored during session    Home Living  Prior Function            PT Goals (current goals can now be found in the care plan section) Progress towards PT goals: Progressing toward goals    Frequency    7X/week      PT Plan Current plan remains appropriate    Co-evaluation              AM-PAC PT "6 Clicks" Daily Activity  Outcome Measure  Difficulty turning over in bed (including adjusting bedclothes,  sheets and blankets)?: Total Difficulty moving from lying on back to sitting on the side of the bed? : Total Difficulty sitting down on and standing up from a chair with arms (e.g., wheelchair, bedside commode, etc,.)?: A Lot Help needed moving to and from a bed to chair (including a wheelchair)?: A Little Help needed walking in hospital room?: A Little Help needed climbing 3-5 steps with a railing? : A Lot 6 Click Score: 12    End of Session Equipment Utilized During Treatment: Gait belt Activity Tolerance: Patient tolerated treatment well Patient left: in chair;with call bell/phone within reach;with family/visitor present;with nursing/sitter in room Nurse Communication: Mobility status PT Visit Diagnosis: Difficulty in walking, not elsewhere classified (R26.2);Pain Pain - Right/Left: Right Pain - part of body: Hip     Time: 2800-3491 PT Time Calculation (min) (ACUTE ONLY): 35 min  Charges:  $Gait Training: 8-22 mins $Therapeutic Exercise: 8-22 mins                    G Codes:       Kittie Plater, PT, DPT Pager #: [phone] Office #: [phone]    [place] 10/05/2016, 8:47 AM

## 2016-10-05 NOTE — Progress Notes (Signed)
Physical Therapy Treatment Patient Details Name: Thomas Mcclure MRN: [id number] DOB: 1956/01/13 Today's Date: 10/05/2016    History of Present Illness Pt is a 61 yo male admitted 8/10 for elective R post THA. PMH: CAD, HTN, GERD, obesity, CAD, NSTEMI, chronic back pain. PSH: back surgery, spinal cord stimulator insertion, cardiac cath.    PT Comments    Pt con't to have much difficulty with transferring in/out of bed requiring modA. Pt with improved ambulation tolerance but con't to require assist with R LE movement in the bed. Unable to complete R quad set. Acute PT to follow.   Follow Up Recommendations  Home health PT;Supervision/Assistance - 24 hour     Equipment Recommendations  None recommended by PT    Recommendations for Other Services       Precautions / Restrictions Precautions Precautions: Posterior Hip;Fall Precaution Booklet Issued: Yes (comment) Precaution Comments: Able to recall 2/3 precautions, unable to remember crossing legs Restrictions Weight Bearing Restrictions: Yes RLE Weight Bearing: Weight bearing as tolerated    Mobility  Bed Mobility Overal bed mobility: Needs Assistance Bed Mobility: Supine to Sit;Sit to Supine     Supine to sit: Mod assist Sit to supine: Min assist   General bed mobility comments: Pt cont to have difficulty with maneurvering R LE off EOB and elevating trunk to long sit due to body habitus. Pt con't to require modA to get to EOB and R LE management in/out of bed  Transfers Overall transfer level: Needs assistance Equipment used: Rolling walker (2 wheeled) Transfers: Sit to/from Stand Sit to Stand: Min guard         General transfer comment: pt with good technique and controlled descent onto EOB  Ambulation/Gait Ambulation/Gait assistance: Min guard Ambulation Distance (Feet): 120 Feet Assistive device: Rolling walker (2 wheeled) Gait Pattern/deviations: Step-through pattern Gait velocity: slow Gait velocity  interpretation: Below normal speed for age/gender General Gait Details: emphasis on trunk extension and decreased UE support and increased LE WBing. v/c's ot increased R step length   Stairs            Wheelchair Mobility    Modified Rankin (Stroke Patients Only)       Balance Overall balance assessment: Needs assistance Sitting-balance support: No upper extremity supported;Feet supported Sitting balance-Leahy Scale: Fair     Standing balance support: Bilateral upper extremity supported;During functional activity Standing balance-Leahy Scale: Poor Standing balance comment: dependent on RW                            Cognition Arousal/Alertness: Awake/alert Behavior During Therapy: WFL for tasks assessed/performed Overall Cognitive Status: Within Functional Limits for tasks assessed                                        Exercises Total Joint Exercises Ankle Circles/Pumps: AROM;Both;10 reps;Supine Quad Sets: AROM;Right;10 reps;Supine Gluteal Sets: AROM;Both;5 reps;Supine Long Arc Quad: AROM;Right;10 reps;Seated Marching in Standing: AROM;Right;10 reps;Standing    General Comments        Pertinent Vitals/Pain Pain Assessment: 0-10 Pain Score: 7  Pain Location: R knee radiating to R hip Pain Descriptors / Indicators: Constant Pain Intervention(s): Monitored during session    Home Living                      Prior Function  PT Goals (current goals can now be found in the care plan section) Acute Rehab PT Goals Patient Stated Goal: home Progress towards PT goals: Progressing toward goals    Frequency    7X/week      PT Plan Current plan remains appropriate    Co-evaluation              AM-PAC PT "6 Clicks" Daily Activity  Outcome Measure  Difficulty turning over in bed (including adjusting bedclothes, sheets and blankets)?: Total Difficulty moving from lying on back to sitting on the side  of the bed? : Total Difficulty sitting down on and standing up from a chair with arms (e.g., wheelchair, bedside commode, etc,.)?: A Lot Help needed moving to and from a bed to chair (including a wheelchair)?: A Little Help needed walking in hospital room?: A Little Help needed climbing 3-5 steps with a railing? : A Lot 6 Click Score: 12    End of Session Equipment Utilized During Treatment: Gait belt Activity Tolerance: Patient tolerated treatment well Patient left: with call bell/phone within reach;with family/visitor present;in bed Nurse Communication: Mobility status PT Visit Diagnosis: Difficulty in walking, not elsewhere classified (R26.2);Pain Pain - Right/Left: Right Pain - part of body: Hip     Time: 7322-0254 PT Time Calculation (min) (ACUTE ONLY): 31 min  Charges:  $Gait Training: 8-22 mins $Therapeutic Exercise: 8-22 mins                    G Codes:       Kittie Plater, PT, DPT Pager #: [phone] Office #: [phone]    [place] 10/05/2016, 3:50 PM

## 2016-10-05 NOTE — Progress Notes (Signed)
Patient ID: Thomas Mcclure, male   DOB: 08-Jan-1956, 61 y.o.   MRN: [id number]     Subjective:  Patient reports pain as mild.  Patient requesting to go home today in bed and in no acute distress  Objective:   VITALS:   Vitals:   10/04/16 2100 10/05/16 0126 10/05/16 0500 10/05/16 1516  BP: 110/67  116/71 111/67  Pulse: (!) 102  96 87  Resp: 19  18 18   Temp: (!) 101.7 F (38.7 C) 100.1 F (37.8 C) 99 F (37.2 C) 98.7 F (37.1 C)  TempSrc: Oral Oral Oral Oral  SpO2: 90%  99% (!) 81%    ABD soft Sensation intact distally Dorsiflexion/Plantar flexion intact Incision: dressing C/D/I and no drainage   Lab Results  Component Value Date   WBC 12.8 (H) 10/05/2016   HGB 10.4 (L) 10/05/2016   HCT 32.0 (L) 10/05/2016   MCV 81.2 10/05/2016   PLT 239 10/05/2016   BMET    Component Value Date/Time   NA 138 10/04/2016 0409   NA 141 05/13/2016 0831   K 3.3 (L) 10/04/2016 0409   CL 103 10/04/2016 0409   CO2 26 10/04/2016 0409   GLUCOSE 155 (H) 10/04/2016 0409   BUN 15 10/04/2016 0409   BUN 11 05/13/2016 0831   CREATININE 1.09 10/04/2016 0409   CREATININE 0.89 02/13/2016 0931   CALCIUM 8.5 (L) 10/04/2016 0409   GFRNONAA >60 10/04/2016 0409   GFRAA >60 10/04/2016 0409     Assessment/Plan: 2 Days Post-Op   Active Problems:   Primary localized osteoarthritis of right hip   Advance diet Up with therapy Discharge home with home health WBAT Dry dressing PRN  DC tonight    [place] 10/05/2016, 5:19 PM  Discussed and agree with above.  Dc home today.   Marchia Bond, MD Cell [phone]

## 2016-10-06 ENCOUNTER — Encounter (HOSPITAL_COMMUNITY): Payer: Self-pay | Admitting: Orthopedic Surgery

## 2016-11-28 ENCOUNTER — Ambulatory Visit (INDEPENDENT_AMBULATORY_CARE_PROVIDER_SITE_OTHER): Payer: Medicare Other | Admitting: Interventional Cardiology

## 2016-11-28 ENCOUNTER — Encounter: Payer: Self-pay | Admitting: Interventional Cardiology

## 2016-11-28 VITALS — BP 110/82 | HR 89 | Ht 70.0 in | Wt 256.4 lb

## 2016-11-28 DIAGNOSIS — I10 Essential (primary) hypertension: Secondary | ICD-10-CM

## 2016-11-28 DIAGNOSIS — I255 Ischemic cardiomyopathy: Secondary | ICD-10-CM

## 2016-11-28 DIAGNOSIS — I2510 Atherosclerotic heart disease of native coronary artery without angina pectoris: Secondary | ICD-10-CM | POA: Diagnosis not present

## 2016-11-28 DIAGNOSIS — E118 Type 2 diabetes mellitus with unspecified complications: Secondary | ICD-10-CM | POA: Diagnosis not present

## 2016-11-28 NOTE — Patient Instructions (Signed)

## 2016-11-28 NOTE — Progress Notes (Signed)
Cardiology Office Note    Date:  11/28/2016   ID:  Mcclure, Thomas 1955-08-18, MRN [id number]  PCP:  [name], MD  Cardiologist: Sinclair Grooms, MD   Chief Complaint  Patient presents with  . Coronary Artery Disease    History of Present Illness:  Thomas Mcclure is a 61 y.o. male history of NSTEMI in 06-2015 status post DES to the RCA complicated by LV aneurysm and chronic systolic heart failure (EF 55-60 percent by echo on 12/31/15, but prior to this was 45-50 percent), diabetes, hypertension, hyperlipidemia, and chronic back pain status post spinal stimulator.   Recent hip replacement surgery.  No cardiac complications associated with surgery. He denies chest pain. He is not needing to use nitroglycerin. There is no lower extremity edema. He is not in a rehabilitation program. He denies orthopnea, PND, and does not have cough. No episodes of syncope.   Past Medical History:  Diagnosis Date  . Arthritis    Rheumatoid  . Baker's cyst    Left calf  . CAD in native artery, 07/15/15 PCI of RCA with DES 07/16/2015   a.   NSTEMI 5/17: LHC - pLAD 20, pLCx 20, OM1 30, pRCA 100, EF 45-50%>> PCI: 2.5 x 24 mm Promus DES to RCA  //  b.   Echo 5/17: mild LVH, EF 50-55%, no RWMA, mod RVE //  c. LHC 6/17: pLAD 20, pLCx 20, OM1 50, pRCA stent ok, EF 35-45% with mod sized inf wall and basal segment aneurysm  . Chronic back pain   . Cyst (solitary) of breast    ? cyst left calf ,knee  . Diabetes mellitus    diet controlled  . GERD (gastroesophageal reflux disease)   . Hyperlipidemia   . Hypertension    Dr. [name] [phone]  . Ischemic cardiomyopathy    a. LV-gram at time of LHC in 6/17 with EF 35-45%  //  b. Echo 7/17: EF 45-50%, inferior HK, grade 1 diastolic dysfunction, mildly dilated aortic root, moderately reduced RVSF, mild RAE  . Myocardial infarction ([place]) 2017  . 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 BACK SURGERY    . CARDIAC CATHETERIZATION N/A 07/15/2015   Procedure: Left Heart Cath and Coronary Angiography;  Surgeon: [name], MD;  Location: [facility];  Service: Cardiovascular;  Laterality: N/A;  . CARDIAC CATHETERIZATION N/A 07/15/2015   Procedure: Coronary Stent Intervention;  Surgeon: [name], MD;  Location: [facility];  Service: Cardiovascular;  Laterality: N/A;  . CARDIAC CATHETERIZATION N/A 08/07/2015   Procedure: Left Heart Cath and Coronary Angiography;  Surgeon: [name], MD;  Location: [facility];  Service: Cardiovascular;  Laterality: N/A;  . CORONARY STENT PLACEMENT  07/2015  . KNEE SURGERY    . LUMBAR SPINE SURGERY  03/2009  & 2012  . MASS EXCISION N/A 04/19/2012   Procedure: removal of posterior cervical lipoma;  Surgeon: [name], MD;  Location: [facility];  Service: Neurosurgery;  Laterality: N/A;  Removal of posterior cervical lipoma  . SPINAL CORD STIMULATOR INSERTION N/A 01/07/2013   Procedure:  SPINAL CORD STIMULATOR INSERTION;  Surgeon: [name], MD;  Location: [facility];  Service: Neurosurgery;  Laterality: N/A;  . TOTAL HIP ARTHROPLASTY Right 10/03/2016   Procedure: TOTAL HIP ARTHROPLASTY;  Surgeon: [name], MD;  Location[facility];  Service: Orthopedics;  Laterality: Right;  . WISDOM TOOTH EXTRACTION     hx of    Current Medications: Outpatient Medications Prior to Visit  Medication Sig Dispense Refill  . apixaban (ELIQUIS) 2.5 MG TABS tablet Take 1 tablet (2.5 mg total) by mouth 2 (two) times daily. 60 tablet 0  . aspirin EC 81 MG tablet Take 1 tablet (81 mg total) by mouth daily. 30 tablet 1  . atorvastatin (LIPITOR) 80 MG tablet TAKE 1 TABLET BY MOUTH EVERY DAY AT 6PM 30 tablet 8  . chlorthalidone (HYGROTON) 25 MG tablet Take 1 tablet (25 mg total) by mouth daily. 90 tablet 2  . cyclobenzaprine (FLEXERIL) 10 MG tablet Take 10 mg by mouth daily as needed for muscle spasms.      . diazepam (VALIUM) 10 MG tablet Take 10 mg by mouth every evening. May take an additional 10 mg daily as needed for muscle spasms    . DILT-XR 180 MG 24 hr capsule Take 180 mg by mouth once daily  4  . DULoxetine (CYMBALTA) 60 MG capsule Take 60 mg by mouth daily.    Marland Kitchen lisinopril (PRINIVIL,ZESTRIL) 20 MG tablet Take 20 mg by mouth every evening.   3  . metFORMIN (GLUCOPHAGE-XR) 750 MG 24 hr tablet Take 750 mg by mouth daily with supper.     . Multiple Vitamin (MULTIVITAMIN WITH MINERALS) TABS tablet Take 1 tablet by mouth daily.    . nitroGLYCERIN (NITROSTAT) 0.4 MG SL tablet PLACE 1 TABLET UNDER THE TONGUE EVERY 5 MINUTES AS NEEDED FOR CHEST PAIN. CALL 911 AT THIRD DOSE WITHIN 15 MINUTES 300 tablet 3  . omeprazole (PRILOSEC) 40 MG capsule Take 40 mg by mouth daily.    . Oxycodone HCl 10 MG TABS Take 1 tablet (10 mg total) by mouth every 4 (four) hours as needed (pain). scheduled 80 tablet 0  . polyethylene glycol powder (GLYCOLAX/MIRALAX) powder Take 17 g by mouth daily as needed (constipation). Mix in 8 oz water, juice, soda, coffee or tea and drink    . potassium chloride SA (K-DUR,KLOR-CON) 20 MEQ tablet Take 1 tablet (20 mEq total) by mouth daily. 90 tablet 3  . pregabalin (LYRICA) 100 MG capsule Take 100-200 mg by mouth 2 (two) times daily. Take 100 mg in the morning and 200 mg at night    . traMADol (ULTRAM) 50 MG tablet Take 50 mg by mouth 3 (three) times daily. scheduled    . triamcinolone ointment (KENALOG) 0.5 % Apply 1 application topically 2 (two) times daily. 30 g 0   No facility-administered medications prior to visit.      Allergies:   Brilinta [ticagrelor]; Methylprednisolone; Prednisone; Toradol [ketorolac tromethamine]; and Adhesive [tape]   Social History   Social History  . Marital status: Married    Spouse name: N/A  . Number of children: 5  . Years of education: N/A   Occupational History  . 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 Smoking status: Former Smoker    Years: 15.00    Types: Cigarettes  . Smokeless tobacco: Former Systems developer    Quit date: 04/08/2015     Comment: 5 cig a week  . Alcohol use No     Comment: doesnt drink stopped 2012  . Drug use: No  . Sexual activity: Not Asked   Other Topics Concern  . None   Social History Narrative  . None  Family History:  The patient's family history includes Heart disease in his mother; Prostate cancer in his brother.   ROS:   Please see the history of present illness.    Chronic back and muscle pain. Right leg pain post hip surgery. Improved compared to prior.  All other systems reviewed and are negative.   PHYSICAL EXAM:   VS:  BP 110/82 (BP Location: Left Arm)   Pulse 89   Ht 5\' 10"  (1.778 m)   Wt 256 lb 6.4 oz (116.3 kg)   BMI 36.79 kg/m    GEN: Well nourished, well developed, in no acute distress  HEENT: normal  Neck: no JVD, carotid bruits, or masses Cardiac: RRR; no murmurs, rubs, or gallops,no edema  Respiratory:  clear to auscultation bilaterally, normal work of breathing GI: soft, nontender, nondistended, + BS MS: no deformity or atrophy  Skin: warm and dry, no rash Neuro:  Alert and Oriented x 3, Strength and sensation are intact Psych: euthymic mood, full affect  Wt Readings from Last 3 Encounters:  11/28/16 256 lb 6.4 oz (116.3 kg)  09/22/16 252 lb 12.8 oz (114.7 kg)  09/12/16 240 lb (108.9 kg)      Studies/Labs Reviewed:   EKG:  EKG  Not done  Recent Labs: 12/30/2015: B Natriuretic Peptide 29.4; Magnesium 1.7; TSH 0.354 09/22/2016: ALT 28 10/04/2016: BUN 15; Creatinine, Ser 1.09; Potassium 3.3; Sodium 138 10/05/2016: Hemoglobin 10.4; Platelets 239   Lipid Panel    Component Value Date/Time   CHOL 118 05/13/2016 0831   TRIG 107 05/13/2016 0831   HDL 32 (L) 05/13/2016 0831   CHOLHDL 3.7 05/13/2016 0831   CHOLHDL 3.2 10/04/2015 1018   VLDL 17 10/04/2015 1018   LDLCALC 65 05/13/2016 0831     Additional studies/ records that were reviewed today include:  No new data    ASSESSMENT:    1. Coronary artery disease involving native coronary artery of native heart without angina pectoris   2. Essential hypertension   3. Ischemic cardiomyopathy   4. Controlled type 2 diabetes mellitus with complication, without long-term current use of insulin (HCC)      PLAN:  In order of problems listed above:  1. Stable without angina. 2. Excellent control. 3. No evidence of heart failure/volume overload. 4. Not addressed.  Maintain targets which include A1c less than 7, blood pressure 130/90 mmHg a less, and LDL cholesterol less than 70. Call if angina.    Medication Adjustments/Labs and Tests Ordered: Current medicines are reviewed at length with the patient today.  Concerns regarding medicines are outlined above.  Medication changes, Labs and Tests ordered today are listed in the Patient Instructions below. Patient Instructions  Medication Instructions:  Your physician recommends that you continue on your current medications as directed. Please refer to the Current Medication list given to you today.   Labwork: None  Testing/Procedures: None  Follow-Up: Your physician wants you to follow-up in: 1 year with [name].  You will receive a reminder letter in the mail two months in advance. If you don't receive a letter, please call our office to schedule the follow-up appointment.   Any Other Special Instructions Will Be Listed Below (If Applicable).     If you need a refill on your cardiac medications before your next appointment, please call your pharmacy.      Signed, Sinclair Grooms, MD  11/28/2016 5:08 PM    [facility] [place], [place]  [address] Phone: [phone]; Fax: [phone]

## 2017-02-18 ENCOUNTER — Encounter (HOSPITAL_COMMUNITY): Payer: Self-pay | Admitting: General Practice

## 2017-02-18 ENCOUNTER — Ambulatory Visit (INDEPENDENT_AMBULATORY_CARE_PROVIDER_SITE_OTHER): Payer: Medicare Other | Admitting: Nurse Practitioner

## 2017-02-18 ENCOUNTER — Other Ambulatory Visit: Payer: Self-pay | Admitting: Nurse Practitioner

## 2017-02-18 ENCOUNTER — Encounter: Payer: Self-pay | Admitting: Nurse Practitioner

## 2017-02-18 ENCOUNTER — Observation Stay (HOSPITAL_COMMUNITY)
Admission: AD | Admit: 2017-02-18 | Discharge: 2017-02-19 | Disposition: A | Discharge status: Home / Self Care | Payer: Medicare Other | Source: Ambulatory Visit | Attending: Internal Medicine | Admitting: Internal Medicine

## 2017-02-18 ENCOUNTER — Other Ambulatory Visit: Payer: Self-pay

## 2017-02-18 ENCOUNTER — Telehealth: Payer: Self-pay | Admitting: Interventional Cardiology

## 2017-02-18 VITALS — BP 120/80 | HR 75 | Ht 72.0 in | Wt 252.1 lb

## 2017-02-18 DIAGNOSIS — R0602 Shortness of breath: Secondary | ICD-10-CM | POA: Insufficient documentation

## 2017-02-18 DIAGNOSIS — I252 Old myocardial infarction: Secondary | ICD-10-CM | POA: Diagnosis not present

## 2017-02-18 DIAGNOSIS — I5022 Chronic systolic (congestive) heart failure: Secondary | ICD-10-CM | POA: Insufficient documentation

## 2017-02-18 DIAGNOSIS — E119 Type 2 diabetes mellitus without complications: Secondary | ICD-10-CM | POA: Insufficient documentation

## 2017-02-18 DIAGNOSIS — Z87891 Personal history of nicotine dependence: Secondary | ICD-10-CM | POA: Diagnosis not present

## 2017-02-18 DIAGNOSIS — K219 Gastro-esophageal reflux disease without esophagitis: Secondary | ICD-10-CM | POA: Insufficient documentation

## 2017-02-18 DIAGNOSIS — Z7984 Long term (current) use of oral hypoglycemic drugs: Secondary | ICD-10-CM | POA: Diagnosis not present

## 2017-02-18 DIAGNOSIS — Z79899 Other long term (current) drug therapy: Secondary | ICD-10-CM | POA: Insufficient documentation

## 2017-02-18 DIAGNOSIS — I110 Hypertensive heart disease with heart failure: Secondary | ICD-10-CM | POA: Diagnosis not present

## 2017-02-18 DIAGNOSIS — M069 Rheumatoid arthritis, unspecified: Secondary | ICD-10-CM | POA: Diagnosis not present

## 2017-02-18 DIAGNOSIS — Z9181 History of falling: Secondary | ICD-10-CM | POA: Diagnosis not present

## 2017-02-18 DIAGNOSIS — E785 Hyperlipidemia, unspecified: Secondary | ICD-10-CM | POA: Diagnosis not present

## 2017-02-18 DIAGNOSIS — Z955 Presence of coronary angioplasty implant and graft: Secondary | ICD-10-CM | POA: Diagnosis not present

## 2017-02-18 DIAGNOSIS — Z7982 Long term (current) use of aspirin: Secondary | ICD-10-CM | POA: Diagnosis not present

## 2017-02-18 DIAGNOSIS — I255 Ischemic cardiomyopathy: Secondary | ICD-10-CM | POA: Diagnosis not present

## 2017-02-18 DIAGNOSIS — Z79891 Long term (current) use of opiate analgesic: Secondary | ICD-10-CM | POA: Diagnosis not present

## 2017-02-18 DIAGNOSIS — Z96641 Presence of right artificial hip joint: Secondary | ICD-10-CM | POA: Insufficient documentation

## 2017-02-18 DIAGNOSIS — E876 Hypokalemia: Secondary | ICD-10-CM | POA: Diagnosis not present

## 2017-02-18 DIAGNOSIS — R079 Chest pain, unspecified: Principal | ICD-10-CM | POA: Insufficient documentation

## 2017-02-18 DIAGNOSIS — I200 Unstable angina: Secondary | ICD-10-CM | POA: Diagnosis present

## 2017-02-18 DIAGNOSIS — I2510 Atherosclerotic heart disease of native coronary artery without angina pectoris: Secondary | ICD-10-CM | POA: Diagnosis not present

## 2017-02-18 HISTORY — DX: Type 2 diabetes mellitus without complications: E11.9

## 2017-02-18 HISTORY — DX: Anesthesia of skin: R20.2

## 2017-02-18 HISTORY — DX: Rheumatoid arthritis, unspecified: M06.9

## 2017-02-18 HISTORY — DX: Anesthesia of skin: R20.0

## 2017-02-18 LAB — COMPREHENSIVE METABOLIC PANEL
ALT: 26 U/L (ref 17–63)
AST: 25 U/L (ref 15–41)
Albumin: 4.2 g/dL (ref 3.5–5.0)
Alkaline Phosphatase: 86 U/L (ref 38–126)
Anion gap: 12 (ref 5–15)
BUN: 10 mg/dL (ref 6–20)
CO2: 27 mmol/L (ref 22–32)
Calcium: 9.8 mg/dL (ref 8.9–10.3)
Chloride: 98 mmol/L — ABNORMAL LOW (ref 101–111)
Creatinine, Ser: 1.01 mg/dL (ref 0.61–1.24)
GFR calc Af Amer: >60 mL/min (ref >60)
GFR calc non Af Amer: >60 mL/min (ref >60)
Glucose, Bld: 128 mg/dL — ABNORMAL HIGH (ref 65–99)
Potassium: 3.5 mmol/L (ref 3.5–5.1)
Sodium: 137 mmol/L (ref 135–145)
Total Bilirubin: 0.6 mg/dL (ref 0.3–1.2)
Total Protein: 7.5 g/dL (ref 6.5–8.1)

## 2017-02-18 LAB — CBC WITH DIFFERENTIAL/PLATELET
Basophils Absolute: 0 10*3/uL (ref 0.0–0.1)
Basophils Relative: 0 %
Eosinophils Absolute: 0.2 10*3/uL (ref 0.0–0.7)
Eosinophils Relative: 2 %
HCT: 40 % (ref 39.0–52.0)
Hemoglobin: 13 g/dL (ref 13.0–17.0)
Lymphocytes Relative: 31 %
Lymphs Abs: 2.7 10*3/uL (ref 0.7–4.0)
MCH: 25.8 pg — ABNORMAL LOW (ref 26.0–34.0)
MCHC: 32.5 g/dL (ref 30.0–36.0)
MCV: 79.4 fL (ref 78.0–100.0)
Monocytes Absolute: 0.5 10*3/uL (ref 0.1–1.0)
Monocytes Relative: 6 %
Neutro Abs: 5.1 10*3/uL (ref 1.7–7.7)
Neutrophils Relative %: 61 %
Platelets: 291 10*3/uL (ref 150–400)
RBC: 5.04 MIL/uL (ref 4.22–5.81)
RDW: 18.1 % — ABNORMAL HIGH (ref 11.5–15.5)
WBC: 8.5 10*3/uL (ref 4.0–10.5)

## 2017-02-18 LAB — APTT: aPTT: 31 seconds (ref 24–36)

## 2017-02-18 LAB — BRAIN NATRIURETIC PEPTIDE: B Natriuretic Peptide: 14.6 pg/mL (ref 0.0–100.0)

## 2017-02-18 LAB — HEMOGLOBIN A1C
Hgb A1c MFr Bld: 7.4 % — ABNORMAL HIGH (ref 4.8–5.6)
Mean Plasma Glucose: 165.68 mg/dL

## 2017-02-18 LAB — PROTIME-INR
INR: 0.96
Prothrombin Time: 12.7 seconds (ref 11.4–15.2)

## 2017-02-18 LAB — TROPONIN I: Troponin I: <0.03 ng/mL (ref <0.03)

## 2017-02-18 LAB — TSH: TSH: 0.589 u[IU]/mL (ref 0.350–4.500)

## 2017-02-18 MED ORDER — DILTIAZEM HCL ER COATED BEADS 180 MG PO CP24
180.0000 mg | ORAL_CAPSULE | Freq: Every day | ORAL | Status: DC
  Administered 2017-02-19: 180 mg via ORAL
  Filled 2017-02-18: qty 1

## 2017-02-18 MED ORDER — PREGABALIN 100 MG PO CAPS
200.0000 mg | ORAL_CAPSULE | Freq: Every day | ORAL | Status: DC
  Administered 2017-02-18: 200 mg via ORAL
  Filled 2017-02-18: qty 2

## 2017-02-18 MED ORDER — PANTOPRAZOLE SODIUM 40 MG PO TBEC
40.0000 mg | DELAYED_RELEASE_TABLET | Freq: Every day | ORAL | Status: DC
  Administered 2017-02-19: 40 mg via ORAL
  Filled 2017-02-18: qty 1

## 2017-02-18 MED ORDER — NITROGLYCERIN 0.4 MG SL SUBL: 0.4000 mg | SUBLINGUAL_TABLET | SUBLINGUAL | Status: DC | PRN

## 2017-02-18 MED ORDER — SODIUM CHLORIDE 0.9% FLUSH: 3.0000 mL | INTRAVENOUS | Status: DC | PRN

## 2017-02-18 MED ORDER — OXYCODONE HCL 5 MG PO TABS
10.0000 mg | ORAL_TABLET | ORAL | Status: DC | PRN
  Administered 2017-02-18 – 2017-02-19 (×7): 10 mg via ORAL
  Filled 2017-02-18 (×7): qty 2

## 2017-02-18 MED ORDER — ASPIRIN 81 MG PO CHEW
324.0000 mg | CHEWABLE_TABLET | ORAL | Status: AC
  Administered 2017-02-18: 324 mg via ORAL
  Filled 2017-02-18: qty 4

## 2017-02-18 MED ORDER — ACETAMINOPHEN 325 MG PO TABS: 650.0000 mg | ORAL_TABLET | ORAL | Status: DC | PRN

## 2017-02-18 MED ORDER — NITROGLYCERIN 2 % TD OINT
1.0000 [in_us] | TOPICAL_OINTMENT | Freq: Three times a day (TID) | TRANSDERMAL | Status: DC
  Administered 2017-02-18 – 2017-02-19 (×2): 1 [in_us] via TOPICAL
  Filled 2017-02-18 (×2): qty 30

## 2017-02-18 MED ORDER — HEPARIN BOLUS VIA INFUSION
4000.0000 [IU] | Freq: Once | INTRAVENOUS | Status: AC
  Administered 2017-02-18: 4000 [IU] via INTRAVENOUS
  Filled 2017-02-18: qty 4000

## 2017-02-18 MED ORDER — ASPIRIN 300 MG RE SUPP: 300.0000 mg | RECTAL | Status: AC

## 2017-02-18 MED ORDER — HEPARIN (PORCINE) IN NACL 100-0.45 UNIT/ML-% IJ SOLN
1600.0000 [IU]/h | INTRAMUSCULAR | Status: DC
  Administered 2017-02-19 (×2): 1600 [IU]/h via INTRAVENOUS
  Filled 2017-02-18 (×2): qty 250

## 2017-02-18 MED ORDER — ONDANSETRON HCL 4 MG/2ML IJ SOLN: 4.0000 mg | Freq: Four times a day (QID) | INTRAMUSCULAR | Status: DC | PRN

## 2017-02-18 MED ORDER — POTASSIUM CHLORIDE CRYS ER 20 MEQ PO TBCR
20.0000 meq | EXTENDED_RELEASE_TABLET | Freq: Every day | ORAL | Status: DC
  Administered 2017-02-19: 20 meq via ORAL
  Filled 2017-02-18: qty 1

## 2017-02-18 MED ORDER — POLYETHYLENE GLYCOL 3350 17 G PO PACK
17.0000 g | PACK | Freq: Every day | ORAL | Status: DC
  Administered 2017-02-18: 17 g via ORAL
  Filled 2017-02-18: qty 1

## 2017-02-18 MED ORDER — METOPROLOL TARTRATE 12.5 MG HALF TABLET
12.5000 mg | ORAL_TABLET | Freq: Two times a day (BID) | ORAL | Status: DC
  Administered 2017-02-18 – 2017-02-19 (×2): 12.5 mg via ORAL
  Filled 2017-02-18 (×2): qty 1

## 2017-02-18 MED ORDER — DIAZEPAM 5 MG PO TABS
10.0000 mg | ORAL_TABLET | ORAL | Status: AC
  Administered 2017-02-19: 10 mg via ORAL
  Filled 2017-02-18: qty 2

## 2017-02-18 MED ORDER — CHLORTHALIDONE 25 MG PO TABS
25.0000 mg | ORAL_TABLET | Freq: Every day | ORAL | Status: DC
  Administered 2017-02-19: 25 mg via ORAL
  Filled 2017-02-18 (×2): qty 1

## 2017-02-18 MED ORDER — ASPIRIN EC 81 MG PO TBEC
81.0000 mg | DELAYED_RELEASE_TABLET | Freq: Every day | ORAL | Status: DC
  Administered 2017-02-19: 81 mg via ORAL
  Filled 2017-02-18: qty 1

## 2017-02-18 MED ORDER — PREGABALIN 100 MG PO CAPS
100.0000 mg | ORAL_CAPSULE | Freq: Every morning | ORAL | Status: DC
  Administered 2017-02-19: 100 mg via ORAL
  Filled 2017-02-18: qty 1

## 2017-02-18 MED ORDER — LISINOPRIL 10 MG PO TABS
20.0000 mg | ORAL_TABLET | Freq: Every day | ORAL | Status: DC
  Administered 2017-02-18 – 2017-02-19 (×2): 20 mg via ORAL
  Filled 2017-02-18 (×2): qty 2

## 2017-02-18 MED ORDER — DULOXETINE HCL 60 MG PO CPEP
60.0000 mg | ORAL_CAPSULE | Freq: Every day | ORAL | Status: DC
Start: 2017-02-18 — End: 2017-02-19
  Administered 2017-02-19: 60 mg via ORAL
  Filled 2017-02-18: qty 1

## 2017-02-18 MED ORDER — SODIUM CHLORIDE 0.9 % IV SOLN: 250.0000 mL | INTRAVENOUS | Status: DC | PRN

## 2017-02-18 MED ORDER — SODIUM CHLORIDE 0.9 % IV SOLN
INTRAVENOUS | Status: DC
  Administered 2017-02-18: 19:00:00 via INTRAVENOUS

## 2017-02-18 MED ORDER — TRAMADOL HCL 50 MG PO TABS: 50.0000 mg | ORAL_TABLET | Freq: Four times a day (QID) | ORAL | Status: DC | PRN

## 2017-02-18 MED ORDER — ASPIRIN 81 MG PO CHEW
81.0000 mg | CHEWABLE_TABLET | ORAL | Status: AC
  Administered 2017-02-19: 81 mg via ORAL
  Filled 2017-02-18: qty 1

## 2017-02-18 MED ORDER — SODIUM CHLORIDE 0.9% FLUSH
3.0000 mL | Freq: Two times a day (BID) | INTRAVENOUS | Status: DC
Start: 2017-02-18 — End: 2017-02-19
  Administered 2017-02-19: 3 mL via INTRAVENOUS

## 2017-02-18 MED ORDER — ATORVASTATIN CALCIUM 80 MG PO TABS
80.0000 mg | ORAL_TABLET | Freq: Every day | ORAL | Status: DC
  Administered 2017-02-18: 80 mg via ORAL
  Filled 2017-02-18: qty 1

## 2017-02-18 NOTE — H&P (Signed)
Office Visit   02/18/2017 [facility]    [name], [name], NP  Cardiology   History of non-ST elevation myocardial infarction (NSTEMI)  Dx   Chest Pain   ; Referred by [name], MD  Reason for Visit   Additional Documentation   Vitals:   BP 120/80 (BP Location: Left Arm, Patient Position: Sitting, Cuff Size: Normal)   Pulse 75   Ht 6' (1.829 m)   Wt 252 lb 1.9 oz (114.4 kg)   BMI 34.19 kg/m   BSA 2.41 m      More Vitals   Flowsheets:   Anthropometrics,   MEWS Score     Encounter Info:   Billing Info,   History,   Allergies,   Detailed Report     All Notes   Procedures by Burtis Junes, NP at 02/18/2017 1:30 PM   Author: Burtis Junes, NP Author Type: Nurse Practitioner Filed: 02/18/2017 2:21 PM  Note Status: Signed Cosign: Cosign Not Required Encounter Date: 02/18/2017  Editor: Sallee Provencal L        Scan on 02/18/2017 2:21 PM by Myles Rosenthal: Ekg - [facility] Scan on 02/18/2017 2:21 PM by Sallee Provencal L: Ekg - [facility]   Progress Notes by Burtis Junes, NP at 02/18/2017 1:30 PM   Author: Burtis Junes, NP Author Type: Nurse Practitioner Filed: 02/18/2017 2:14 PM  Note Status: Addendum Cosign: Cosign Not Required Encounter Date: 02/18/2017  Editor: Burtis Junes, NP (Nurse Practitioner)  Prior Versions: 1. Burtis Junes, NP (Nurse Practitioner) at 02/18/2017 1:58 PM - Signed  Expand All Collapse All       CARDIOLOGY OFFICE NOTE  Date:  02/18/2017    Thomas Mcclure Date of Birth: Jun 07, 1955 Medical Record #[id number]  PCP:  [name], MD           Cardiologist:  Thomas Mcclure      Chief Complaint  Patient presents with  . Chest Pain    Work in visit - seen for [name]    History of Present Illness: Thomas Mcclure is a 61 y.o. male who presents today for a work in visit.   He has a history of NSTEMI in 5-2017status post DES to the RCA  complicated by LV aneurysm and chronic systolic heart EVOJJKK(XF81-82 percent by echo on 12/31/15, but prior to this was 45-50 percent), diabetes, hypertension, hyperlipidemia, and chronic back pain status post spinal stimulator.His last cath was in June of 2017 - see below - has continued with medical therapy.He has been noted to have chronically elevated troponin levels.   Syncopal spell last year - medicines were reduced down.   Admitted 3/7/18with recurrent atypical chest pain. He recently stopped and then restarted his Clopidogrel for a lumbar myelogram, and he became concerned that his stent may have occluded and as such took 2 nitroglycerin tabs with no effect and then presented to the emergency department. EKG showed nonspecific T-wave changes in V3. His first set of enzymes was negative.  Hewas admitted and monitored on telemetry. Heunderwent Lexiscanwhich showed no reversible ischemia, old infarct/scar, and EF of 44%. Per Dr. Marlou Porch: No further evaluation needed, follow-up with cardiologist as an outpatient.  He last saw [name] in October - he had had hip surgery and did ok. Cardiac status was ok.   Several phone calls today noted -   1st phone call - Patient's wife called to report the patient  fell late last week and has been having problems ever since. He was never assessed by a medical professional after the fall. She has been checking his VS and reports they have been very low. When asked values, she reported BPs of 120s/70s and HR in the 80s.Attempted to tell her these results are normal and to ask if he is experiencing symptoms, but [name] interrupted several times and would not allow caller to speak.  She reports "his chest must be hurting since the fall" but she does not want to take him the ED to "stay there all day." She states he has not directly told her that his chest hurts - she "just knows." After several interruptions by [name], instructed her to call  PCP for evaluation - he may have experienced some trauma from the fall that needs follow-up and if [name] suspects cardiac trouble he will call the office. [name] exclaimed, "FIne. But [name] would not like this" and hung up the phone.  2nd phone call - A scheduler from [facility] wanted to add pt on schedule stated was low in the 80's and bp was 117/72, stated this was normal. Stated to go ahead and send caller through. After talking with wife stating this was normal stated the pt had a heart attack and held out for four days before going to hospital. Wife stated pt is having chest pain pt will not do anything or go to any doctor's to get checked. Stated if having CP now per [name] to call EMS so pt can get a EKG. [name] started raising voice. Stated would have to talk to pt to assess. Pt picked up phone and stated was tired. Asked if pt has any CP, pt stated not for a while, stated to clarify awhile, stated a couple days ago. Asked pt if taken any nitro, pt stated yesterday with relief, stated pt just stated hasn't had CP in a while. Asked pt if can come in today for 1:30 appt. Pt said I don't think so. Stated that is the only time available. Pt stated OK. If pt has CP before appt to call EMS.  Comes in today. Here withhis wife - [name]. History quite hard to follow. They are arguing with each other during the visit. He says he is fine. She does most of the talking. She is under the impression that his HR needs to be "80 to 100".  His balance is poor. He is falling. Appetite is poor. He fell over the weekend. He is no longer on Eliquis. Says [name] stopped this - I do not see this - suspect he finished his course following his hip surgery. He then tells me that he has not felt well over the past few days. Few "spurts of chest pain". Took NTG - that helped him yesterday. Chest feels sore. He feels sore. He tells me that he feels exactly like he did when he had his MI. Wife notes shortness of  breath, gasping for breathing,  "holding his chest", etc. Says just "weak" now. Explained that admission will now be needed - he then said "let me change my story".        Past Medical History:  Diagnosis Date  . Arthritis    Rheumatoid  . Baker's cyst    Left calf  . CAD in native artery, 07/15/15 PCI of RCA with DES 07/16/2015   a.   NSTEMI 5/17: LHC - pLAD 20, pLCx 20, OM1 30,  pRCA 100, EF 45-50%>> PCI: 2.5 x 24 mm Promus DES to RCA  //  b.   Echo 5/17: mild LVH, EF 50-55%, no RWMA, mod RVE //  c. LHC 6/17: pLAD 20, pLCx 20, OM1 50, pRCA stent ok, EF 35-45% with mod sized inf wall and basal segment aneurysm  . Chronic back pain   . Cyst (solitary) of breast    ? cyst left calf ,knee  . Diabetes mellitus    diet controlled  . GERD (gastroesophageal reflux disease)   . Hyperlipidemia   . Hypertension    Dr. [name] [phone]  . Ischemic cardiomyopathy    a. LV-gram at time of LHC in 6/17 with EF 35-45%  //  b. Echo 7/17: EF 45-50%, inferior HK, grade 1 diastolic dysfunction, mildly dilated aortic root, moderately reduced RVSF, mild RAE  . Myocardial infarction ([place]) 2017  . 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 BACK SURGERY    . CARDIAC CATHETERIZATION N/A 07/15/2015   Procedure: Left Heart Cath and Coronary Angiography;  Surgeon: [name], MD;  Location: [place] [facility];  Service: Cardiovascular;  Laterality: N/A;  . CARDIAC CATHETERIZATION N/A 07/15/2015   Procedure: Coronary Stent Intervention;  Surgeon: [name], MD;  Location: [place] [facility];  Service: Cardiovascular;  Laterality: N/A;  . CARDIAC CATHETERIZATION N/A 08/07/2015   Procedure: Left Heart Cath and Coronary Angiography;  Surgeon: [name], MD;  Location: [place] [facility];  Service: Cardiovascular;  Laterality: N/A;  . CORONARY STENT PLACEMENT  07/2015  . KNEE SURGERY    . LUMBAR  SPINE SURGERY  03/2009  & 2012  . MASS EXCISION N/A 04/19/2012   Procedure: removal of posterior cervical lipoma;  Surgeon: [name], MD;  Location: [facility];  Service: Neurosurgery;  Laterality: N/A;  Removal of posterior cervical lipoma  . SPINAL CORD STIMULATOR INSERTION N/A 01/07/2013   Procedure:  SPINAL CORD STIMULATOR INSERTION;  Surgeon: [name], MD;  Location: [facility];  Service: Neurosurgery;  Laterality: N/A;  . TOTAL HIP ARTHROPLASTY Right 10/03/2016   Procedure: TOTAL HIP ARTHROPLASTY;  Surgeon: [name], MD;  Location: [place];  Service: Orthopedics;  Laterality: Right;  . WISDOM TOOTH EXTRACTION     hx of     Medications: ActiveMedications      Current Meds  Medication Sig  . aspirin EC 81 MG tablet Take 1 tablet (81 mg total) by mouth daily.  Marland Kitchen atorvastatin (LIPITOR) 80 MG tablet TAKE 1 TABLET BY MOUTH EVERY DAY AT 6PM  . chlorthalidone (HYGROTON) 25 MG tablet Take 1 tablet (25 mg total) by mouth daily.  . cyclobenzaprine (FLEXERIL) 10 MG tablet Take 10 mg by mouth daily as needed for muscle spasms.  . diazepam (VALIUM) 10 MG tablet Take 10 mg by mouth every evening. May take an additional 10 mg daily as needed for muscle spasms  . DILT-XR 180 MG 24 hr capsule Take 180 mg by mouth once daily  . DULoxetine (CYMBALTA) 60 MG capsule Take 60 mg by mouth daily.  Marland Kitchen lisinopril (PRINIVIL,ZESTRIL) 20 MG tablet Take 20 mg by mouth every evening.   . metFORMIN (GLUCOPHAGE-XR) 750 MG 24 hr tablet Take 750 mg by mouth daily with supper.   . Multiple Vitamin (MULTIVITAMIN WITH MINERALS) TABS tablet Take 1 tablet by mouth daily.  . nitroGLYCERIN (NITROSTAT)  0.4 MG SL tablet PLACE 1 TABLET UNDER THE TONGUE EVERY 5 MINUTES AS NEEDED FOR CHEST PAIN. CALL 911 AT THIRD DOSE WITHIN 15 MINUTES  . omeprazole (PRILOSEC) 40 MG capsule Take 40 mg by mouth daily.  . Oxycodone HCl 10 MG TABS Take 1 tablet (10 mg total) by mouth every 4 (four) hours as needed  (pain). scheduled  . polyethylene glycol powder (GLYCOLAX/MIRALAX) powder Take 17 g by mouth daily as needed (constipation). Mix in 8 oz water, juice, soda, coffee or tea and drink  . potassium chloride SA (K-DUR,KLOR-CON) 20 MEQ tablet Take 1 tablet (20 mEq total) by mouth daily.  . pregabalin (LYRICA) 100 MG capsule Take 100-200 mg by mouth 2 (two) times daily. Take 100 mg in the morning and 200 mg at night  . traMADol (ULTRAM) 50 MG tablet Take 50 mg by mouth 3 (three) times daily. scheduled  . triamcinolone ointment (KENALOG) 0.5 % Apply 1 application topically 2 (two) times daily.       Allergies:      Allergies  Allergen Reactions  . Brilinta [Ticagrelor] Shortness Of Breath and Other (See Comments)    CHEST PAIN   . Methylprednisolone Other (See Comments)    DIAPHORESIS HYPOTENSION  . Prednisone Other (See Comments)    DIAPHORESIS HYPOTENSION  . Toradol [Ketorolac Tromethamine] Other (See Comments)    DIAPHORESIS HYPOTENSION    . Adhesive [Tape] Rash and Other (See Comments)    "blisters" ( NO SURGICAL TAPE, PLEASE)    Social History: The patient  reports that he has quit smoking. His smoking use included cigarettes. He quit after 15.00 years of use. He quit smokeless tobacco use about 22 months ago. He reports that he does not drink alcohol or use drugs.   Family History: The patient's family history includes Heart disease in his mother; Prostate cancer in his brother.   Review of Systems: Please see the history of present illness.   Otherwise, the review of systems is positive for none.   All other systems are reviewed and negative.   Physical Exam: VS:  BP 120/80 (BP Location: Left Arm, Patient Position: Sitting, Cuff Size: Normal)   Pulse 75   Ht 6' (1.829 m)   Wt 252 lb 1.9 oz (114.4 kg)   BMI 34.19 kg/m  .  BMI Body mass index is 34.19 kg/m.     Wt Readings from Last 3 Encounters:  02/18/17 252 lb 1.9 oz (114.4 kg)  11/28/16 256 lb  6.4 oz (116.3 kg)  09/22/16 252 lb 12.8 oz (114.7 kg)    General: Alert. He is in no acute distress.  He is of large stature.  HEENT: Normal.  Neck: Supple, no JVD, carotid bruits, or masses noted.  Cardiac: Regular rate and rhythm. No murmurs, rubs, or gallops. No edema.  Respiratory:  Lungs are clear to auscultation bilaterally with normal work of breathing.  GI: Soft and nontender.  MS: No deformity or atrophy. Gait and ROM intact.  Skin: Warm and dry. Color is normal.  Neuro:  Strength and sensation are intact and no gross focal deficits noted.  Psych: Alert, appropriate and with normal affect.   LABORATORY DATA:  EKG:  EKG is ordered today. This demonstrates NSR with prior inferior infarct - unchanged.  Today's tracing with less T wave changes. Reviewed with Dr. Lovena Le.   RecentLabs       Lab Results  Component Value Date   WBC 12.8 (H) 10/05/2016   HGB 10.4 (L)  10/05/2016   HCT 32.0 (L) 10/05/2016   PLT 239 10/05/2016   GLUCOSE 155 (H) 10/04/2016   CHOL 118 05/13/2016   TRIG 107 05/13/2016   HDL 32 (L) 05/13/2016   LDLCALC 65 05/13/2016   ALT 28 09/22/2016   AST 31 09/22/2016   NA 138 10/04/2016   K 3.3 (L) 10/04/2016   CL 103 10/04/2016   CREATININE 1.09 10/04/2016   BUN 15 10/04/2016   CO2 26 10/04/2016   TSH 0.354 12/30/2015   PSA 0.37 08/31/2009   INR 0.95 09/22/2016   HGBA1C 7.6 (H) 09/22/2016   MICROALBUR 0.50 08/07/2009       BNP (last 3 results) RecentLabs(withinlast365days)  No results for input(s): BNP in the last 8760 hours.    ProBNP (last 3 results) RecentLabs(withinlast365days)  No results for input(s): PROBNP in the last 8760 hours.     Other Studies Reviewed Today:  MYOVIEWFINDINGS3/2018: Perfusion: There is a fixed defect noted inferiorly on rest and stress images compatible with old infarct/ scar. No reversibility to suggest ischemia  Wall Motion: Decreased wall motion and  thickening inferiorly in the area of scar.  Left Ventricular Ejection Fraction: 44 %  End diastolic volume 235 ml  End systolic volume 62 ml  IMPRESSION: 1. No reversible ischemia. Old infarct/scar inferiorly.  2. Decreased wall motion and thickening inferiorly.  3. Left ventricular ejection fraction 44%  4. Non invasive risk stratification*: Intermediate  *2012 Appropriate Use Criteria for Coronary Revascularization Focused Update: J Am Coll Cardiol. 3614;43(1):540-086. http://content.airportbarriers.com.aspx?articleid=1201161   Electronically Signed By: Rolm Baptise M.D. On: 05/01/2016 16:10  EchoStudy Conclusionsfrom 12/2015  - Left ventricle: The cavity size was normal. There was mild concentric hypertrophy. Systolic function was normal. The estimated ejection fraction was in the range of 55% to 60%. There is akinesis of the basal-midinferior myocardium. Doppler parameters are consistent with abnormal left ventricular relaxation (grade 1 diastolic dysfunction).   Cardiac CathConclusionfrom 07/2015  1. Prox LAD to Mid LAD lesion, 20% stenosed. 2. Prox Cx to Mid Cx lesion, 20% stenosed. 3. Ost 1st Mrg to 1st Mrg lesion, 50% stenosed.   Widely patent right coronary artery including recently placed proximal stent at the time of acute coronary intervention.  Irregularities noted in the proximal and mid LAD as well as circumflex. There is up to 50% stenosis in the proximal segment of the large first obtuse marginal.  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 Overall LV function is moderately decreased with an EF of 35-40%. LVEDP 23 mmHg.  No obvious explanation for the patient's ongoing chest pain complaints.     Assessment/Plan:  1. Chest pain -has known CAD with prior PCI to the RCA and with knownresidual disease - last Myoview low risk from March noted. Now with several days of not feeling well - short  of breath/chest pain - has had chronically elevated troponins. He says this is identical to his presentation with his MI in 2017. He has had noted elevated troponins in the record.  [place] admission with plans to proceed with cardiac cath tomorrow. He is agreeable. Discussed with Dr. Lovena Le here in the office who concurs. Will try adding low dose beta blocker.   2. HTN - BP is ok on his current regimen.   3. Diastolic dysfunction with prior systolic dysfunction - EF now normalby last echo but at 48% per recent Myoview - would favor continued medical therapy.   4. HLD- on statin therapy   5  Prior syncope - had medicines  reduced in the past - no recurrence.  6. Prior hip surgery from August - no longer on anticoagulation.   Current medicines are reviewed with the patient today.  The patient does not have concerns regarding medicines other than what has been noted above.  The following changes have been made:  See above.  Labs/ tests ordered today include:       Orders Placed This Encounter  Procedures  . EKG 12-Lead     Disposition:   To be admitted. Transferred by private car to telemetry bed at [facility].   Patient is agreeable to this plan and will call if any problems develop in the interim.   SignedTruitt Merle, NP  02/18/2017 1:47 PM  [facility] [place] [address] [place], [place]  [address] Phone: [phone] Fax: [phone]      Cardiology Attending  I have discussed the above patient with Truitt Merle, NP and agree with admission to the hospital for evaluation of crescendo anginal symptoms. Rabecca Birge,M.D.      Patient Instructions by Burtis Junes, NP at 02/18/2017 1:30 PM   Author: Burtis Junes, NP Author Type: Nurse Practitioner Filed: 02/18/2017 1:58 PM  Note Status: Addendum Mickle Mallory: Cosign Not Required Encounter Date: 02/18/2017  Editor: Burtis Junes, NP (Nurse Practitioner)    Prior Versions: 1. Burtis Junes, NP (Nurse Practitioner) at 02/18/2017 1:19 PM - Signed    We are admitting you to the hospital today.   We are planning for cardiac catheterization tomorrow.   Call the [place] office at [phone] if you have any questions, problems or concerns.         Instructions   Patient Instructions        Communications      [facility] Provider CC Chart Rep sent to [name], MD and [name], MD     Chart Routed to [name], MD  Media   Electronic signature on 02/18/2017 1:16 PM   Communication Routing History   Recipient Method Sent by Date Sent  [name], MD In [place], [place], NP 02/18/2017     [name], MD In Philis Pique, NP 02/18/2017     Orders Placed      EKG 12-Lead  Medication Changes        (Discontinued by provider)    Medication List  Visit Diagnoses      History of non-ST elevation myocardial infarction (NSTEMI)    Problem List  Level of Service   Level of Service  PR OFFICE OUTPATIENT VISIT 25 MINUTES [19509]  All Charges for This Encounter   Code Description Service Date Service Provider Modifiers Qty  32671 IW [id number] OUTPATIENT VISIT 25 MINUTES 02/18/2017 Burtis Junes, NP  1  93000 PR ELECTROCARDIOGRAM, COMPLETE 02/18/2017 Burtis Junes, NP  1

## 2017-02-18 NOTE — Progress Notes (Signed)
Patient arrived as a direct admit from the doctor's office to 4E room 20.  Telemetry monitor applied and CCMD notified.  Patient oriented to unit and room to include call light and phone.

## 2017-02-18 NOTE — Telephone Encounter (Signed)
Patient's wife called to report the patient fell late last week and has been having problems ever since. He was never assessed by a medical professional after the fall.  She has been checking his VS and reports they have been very low. When asked values, she reported BPs of 120s/70s and HR in the 80s. Attempted to tell her these results are normal and to ask if he is experiencing symptoms, but Ms. Lundin interrupted several times and would not allow caller to speak.    She reports "his chest must be hurting since the fall" but she does not want to take him the ED to "stay there all day." She states he has not directly told her that his chest hurts - she "just knows."  After several interruptions by Ms. Stroud, instructed her to call PCP for evaluation - he may have experienced some trauma from the fall that needs follow-up and if [name] suspects cardiac trouble he will call the office. Mrs. Flenner exclaimed, "FIne. But [name] would not like this" and hung up the phone.

## 2017-02-18 NOTE — Telephone Encounter (Signed)
New Message  Pt wife call requesting to speak with RN. She states she is returning the RN call. Please call back to discuss

## 2017-02-18 NOTE — Progress Notes (Signed)
ANTICOAGULATION CONSULT NOTE - Initial Consult  Pharmacy Consult for heparin Indication: chest pain/ACS  Allergies  Allergen Reactions  . Brilinta [Ticagrelor] Shortness Of Breath and Other (See Comments)    CHEST PAIN   . Methylprednisolone Other (See Comments)    DIAPHORESIS HYPOTENSION  . Prednisone Other (See Comments)    DIAPHORESIS HYPOTENSION  . Toradol [Ketorolac Tromethamine] Other (See Comments)    DIAPHORESIS HYPOTENSION    . Adhesive [Tape] Rash and Other (See Comments)    "blisters" ( NO SURGICAL TAPE, PLEASE)    Patient Measurements: Height: 6' (182.9 cm) Weight: 252 lb 3.3 oz (114.4 kg) IBW/kg (Calculated) : 77.6 Heparin Dosing Weight: 102 kg  Vital Signs: Temp: 98.2 F (36.8 C) (12/26 1648) Temp Source: Oral (12/26 1648) BP: 120/80 (12/26 1328) Pulse Rate: 75 (12/26 1328)  Labs: No results for input(s): HGB, HCT, PLT, APTT, LABPROT, INR, HEPARINUNFRC, HEPRLOWMOCWT, CREATININE, CKTOTAL, CKMB, TROPONINI in the last 72 hours.  CrCl cannot be calculated (Patient's most recent lab result is older than the maximum 21 days allowed.).   Medical History: Past Medical History:  Diagnosis Date  . Arthritis    Rheumatoid  . Baker's cyst    Left calf  . CAD in native artery, 07/15/15 PCI of RCA with DES 07/16/2015   a.   NSTEMI 5/17: LHC - pLAD 20, pLCx 20, OM1 30, pRCA 100, EF 45-50%>> PCI: 2.5 x 24 mm Promus DES to RCA  //  b.   Echo 5/17: mild LVH, EF 50-55%, no RWMA, mod RVE //  c. LHC 6/17: pLAD 20, pLCx 20, OM1 50, pRCA stent ok, EF 35-45% with mod sized inf wall and basal segment aneurysm  . Chronic back pain   . Cyst (solitary) of breast    ? cyst left calf ,knee  . Diabetes mellitus    diet controlled  . GERD (gastroesophageal reflux disease)   . Hyperlipidemia   . Hypertension    Dr. [name] [phone]  . Ischemic cardiomyopathy    a. LV-gram at time of LHC in 6/17 with EF 35-45%  //  b. Echo 7/17: EF 45-50%, inferior HK, grade 1  diastolic dysfunction, mildly dilated aortic root, moderately reduced RVSF, mild RAE  . Myocardial infarction ([place]) 2017  . 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 Also with SOB, weakness, and fall over the weekend. Noted to have chronically elevated troponin levels. Pharmacy consulted to begin heparin drip. No anticoagulation PTA. Patient reports no hx bleeding.  Labs for this admit pending. Last labs from 09/2016 include: SCr 1.09, H/H 10.4/32, platelets 239.  Goal of Therapy:  Heparin level 0.3-0.7 units/ml Monitor platelets by anticoagulation protocol: Yes   Plan:  - Heparin 4000 unit IV bolus then infuse at 1400 units/hr - 6 hr heparin level - Daily heparin level and CBC - Monitor for s/sx of bleeding   Renold Genta, PharmD, BCPS Clinical Pharmacist Phone for today - [place] - [phone] 02/18/2017 5:00 PM

## 2017-02-18 NOTE — H&P (View-Only) (Signed)
CARDIOLOGY OFFICE NOTE  Date:  02/18/2017    Thomas Mcclure Date of Birth: [birth year] Medical Record #[id number]  PCP:  Thomas Blocker, MD  Cardiologist:  Thomas Mcclure  Chief Complaint  Patient presents with  . Chest Pain    Work in visit - seen for Thomas Mcclure    History of Present Illness: Thomas Mcclure is a 61 y.o. male who presents today for a work in visit.   He has a history of NSTEMI in 5-2017status post DES to the RCA complicated by LV aneurysm and chronic systolic heart [id number](IH47-42 percent by echo on 12/31/15, but prior to this was 45-50 percent), diabetes, hypertension, hyperlipidemia, and chronic back pain status post spinal stimulator. His last cath was in June of 2017 - see below - has continued with medical therapy. He has been noted to have chronically elevated troponin levels.   Syncopal spell last year - medicines were reduced down.   Admitted 3/7/18with recurrent atypical chest pain. He recently stopped and then restarted his Clopidogrel for a lumbar myelogram, and he became concerned that his stent may have occluded and as such took 2 nitroglycerin tabs with no effect and then presented to the emergency department. EKG showed nonspecific T-wave changes in V3. His first set of enzymes was negative.  He was admitted and monitored on telemetry. He underwent Lexiscan which showed no reversible ischemia, old infarct/scar, and EF of 44%. Per Thomas Mcclure: No further evaluation needed, follow-up with cardiologist as an outpatient.  He last saw Thomas Mcclure in October - he had had hip surgery and did ok. Cardiac status was ok.   Several phone calls today noted -   1st phone call - Patient's wife called to report the patient fell late last week and has been having problems ever since. He was never assessed by a medical professional after the fall. She has been checking his VS and reports they have been very low. When asked values, she reported BPs of  120s/70s and HR in the 80s. Attempted to tell her these results are normal and to ask if he is experiencing symptoms, but Thomas Mcclure interrupted several times and would not allow caller to speak.    She reports "his chest must be hurting since the fall" but she does not want to take him the ED to "stay there all day." She states he has not directly told her that his chest hurts - she "just knows." After several interruptions by Thomas Mcclure, instructed her to call PCP for evaluation - he may have experienced some trauma from the fall that needs follow-up and if Thomas Mcclure suspects cardiac trouble he will call the office. Thomas Mcclure exclaimed, "FIne. But Thomas Mcclure would not like this" and hung up the phone.  2nd phone call - A scheduler from [facility] wanted to add pt on schedule stated was low in the 80's and bp was 117/72, stated this was normal.  Stated to go ahead and send caller through.  After talking with wife stating this was normal stated the pt had a heart attack and held out for four days before going to hospital.  Wife stated pt is having chest pain pt will not do anything or go to any doctor's to get checked.  Stated if having CP now per Thomas Mcclure to call EMS so pt can get a EKG.  [name] started raising voice.  Stated would have to talk to pt to assess. Pt picked  up phone and stated was tired.  Asked if pt has any CP, pt stated not for a while, stated to clarify awhile, stated a couple days ago.  Asked pt if taken any nitro, pt stated yesterday with relief, stated pt just stated hasn't had CP in a while.  Asked pt if can come in today for 1:30 appt.  Pt said I don't think so. Stated that is the only time available.  Pt stated OK.  If pt has CP before appt to call EMS.  Comes in today. Here with his wife - Thomas Mcclure. History quite hard to follow. They are arguing with each other during the visit. He says he is fine. She does most of the talking. She is under the impression that his HR needs to be "80 to 100".  His  balance is poor. He is falling. Appetite is poor. He fell over the weekend. He is no longer on Eliquis. Says Thomas Mcclure stopped this - I do not see this - suspect he finished his course following his hip surgery. He then tells me that he has not felt well over the past few days. Few "spurts of chest pain". Took NTG - that helped him yesterday. Chest feels sore. He feels sore. He tells me that he feels exactly like he did when he had his MI. Wife notes shortness of breath, gasping for breathing,  "holding his chest", etc. Says just "weak" now. Explained that admission will now be needed - he then said "let me change my story".    Past Medical History:  Diagnosis Date  . Arthritis    Rheumatoid  . Baker's cyst    Left calf  . CAD in native artery, 07/15/15 PCI of RCA with DES 07/16/2015   a.   NSTEMI 5/17: LHC - pLAD 20, pLCx 20, OM1 30, pRCA 100, EF 45-50%>> PCI: 2.5 x 24 mm Promus DES to RCA  //  b.   Echo 5/17: mild LVH, EF 50-55%, no RWMA, mod RVE //  c. LHC 6/17: pLAD 20, pLCx 20, OM1 50, pRCA stent ok, EF 35-45% with mod sized inf wall and basal segment aneurysm  . Chronic back pain   . Cyst (solitary) of breast    ? cyst left calf ,knee  . Diabetes mellitus    diet controlled  . GERD (gastroesophageal reflux disease)   . Hyperlipidemia   . Hypertension    Thomas Mcclure [phone]  . Ischemic cardiomyopathy    a. LV-gram at time of LHC in 6/17 with EF 35-45%  //  b. Echo 7/17: EF 45-50%, inferior HK, grade 1 diastolic dysfunction, mildly dilated aortic root, moderately reduced RVSF, mild RAE  . Myocardial infarction ([place]) 2017  . 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 BACK SURGERY    . CARDIAC CATHETERIZATION N/A 07/15/2015   Procedure: Left Heart Cath and Coronary Angiography;  Surgeon: Thomas M Martinique, MD;  Location: [facility];  Service: Cardiovascular;  Laterality: N/A;  . CARDIAC CATHETERIZATION N/A  07/15/2015   Procedure: Coronary Stent Intervention;  Surgeon: Thomas M Martinique, MD;  Location: [facility];  Service: Cardiovascular;  Laterality: N/A;  . CARDIAC CATHETERIZATION N/A 08/07/2015   Procedure: Left Heart Cath and Coronary Angiography;  Surgeon: [name], MD;  Location: [facility];  Service: Cardiovascular;  Laterality: N/A;  . CORONARY STENT PLACEMENT  07/2015  .  KNEE SURGERY    . LUMBAR SPINE SURGERY  03/2009  & 2012  . MASS EXCISION N/A 04/19/2012   Procedure: removal of posterior cervical lipoma;  Surgeon: [name], MD;  Location: [facility];  Service: Neurosurgery;  Laterality: N/A;  Removal of posterior cervical lipoma  . SPINAL CORD STIMULATOR INSERTION N/A 01/07/2013   Procedure:  SPINAL CORD STIMULATOR INSERTION;  Surgeon: [name], MD;  Location: [facility];  Service: Neurosurgery;  Laterality: N/A;  . TOTAL HIP ARTHROPLASTY Right 10/03/2016   Procedure: TOTAL HIP ARTHROPLASTY;  Surgeon: [name], MD;  Location: [facility];  Service: Orthopedics;  Laterality: Right;  . WISDOM TOOTH EXTRACTION     hx of     Medications: Current Meds  Medication Sig  . aspirin EC 81 MG tablet Take 1 tablet (81 mg total) by mouth daily.  Marland Kitchen atorvastatin (LIPITOR) 80 MG tablet TAKE 1 TABLET BY MOUTH EVERY DAY AT 6PM  . chlorthalidone (HYGROTON) 25 MG tablet Take 1 tablet (25 mg total) by mouth daily.  . cyclobenzaprine (FLEXERIL) 10 MG tablet Take 10 mg by mouth daily as needed for muscle spasms.  . diazepam (VALIUM) 10 MG tablet Take 10 mg by mouth every evening. May take an additional 10 mg daily as needed for muscle spasms  . DILT-XR 180 MG 24 hr capsule Take 180 mg by mouth once daily  . DULoxetine (CYMBALTA) 60 MG capsule Take 60 mg by mouth daily.  Marland Kitchen lisinopril (PRINIVIL,ZESTRIL) 20 MG tablet Take 20 mg by mouth every evening.   . metFORMIN (GLUCOPHAGE-XR) 750 MG 24 hr tablet Take 750 mg by mouth daily with supper.   . Multiple Vitamin  (MULTIVITAMIN WITH MINERALS) TABS tablet Take 1 tablet by mouth daily.  . nitroGLYCERIN (NITROSTAT) 0.4 MG SL tablet PLACE 1 TABLET UNDER THE TONGUE EVERY 5 MINUTES AS NEEDED FOR CHEST PAIN. CALL 911 AT THIRD DOSE WITHIN 15 MINUTES  . omeprazole (PRILOSEC) 40 MG capsule Take 40 mg by mouth daily.  . Oxycodone HCl 10 MG TABS Take 1 tablet (10 mg total) by mouth every 4 (four) hours as needed (pain). scheduled  . polyethylene glycol powder (GLYCOLAX/MIRALAX) powder Take 17 g by mouth daily as needed (constipation). Mix in 8 oz water, juice, soda, coffee or tea and drink  . potassium chloride Mcclure (K-DUR,KLOR-CON) 20 MEQ tablet Take 1 tablet (20 mEq total) by mouth daily.  . pregabalin (LYRICA) 100 MG capsule Take 100-200 mg by mouth 2 (two) times daily. Take 100 mg in the morning and 200 mg at night  . traMADol (ULTRAM) 50 MG tablet Take 50 mg by mouth 3 (three) times daily. scheduled  . triamcinolone ointment (KENALOG) 0.5 % Apply 1 application topically 2 (two) times daily.     Allergies: Allergies  Allergen Reactions  . Brilinta [Ticagrelor] Shortness Of Breath and Other (See Comments)    CHEST PAIN   . Methylprednisolone Other (See Comments)    DIAPHORESIS HYPOTENSION  . Prednisone Other (See Comments)    DIAPHORESIS HYPOTENSION  . Toradol [Ketorolac Tromethamine] Other (See Comments)    DIAPHORESIS HYPOTENSION    . Adhesive [Tape] Rash and Other (See Comments)    "blisters" ( NO SURGICAL TAPE, PLEASE)    Social History: The patient  reports that he has quit smoking. His smoking use included cigarettes. He quit after 15.00 years of use. He quit smokeless tobacco use about 22 months ago. He reports that he does not drink alcohol or use drugs.  Family History: The patient's family history includes Heart disease in his mother; Prostate cancer in his brother.   Review of Systems: Please see the history of present illness.   Otherwise, the review of systems is positive for none.    All other systems are reviewed and negative.   Physical Exam: VS:  BP 120/80 (BP Location: Left Arm, Patient Position: Sitting, Cuff Size: Normal)   Pulse 75   Ht 6' (1.829 m)   Wt 252 lb 1.9 oz (114.4 kg)   BMI 34.19 kg/m  .  BMI Body mass index is 34.19 kg/m.  Wt Readings from Last 3 Encounters:  02/18/17 252 lb 1.9 oz (114.4 kg)  11/28/16 256 lb 6.4 oz (116.3 kg)  09/22/16 252 lb 12.8 oz (114.7 kg)    General: Alert. He is in no acute distress.  He is of large stature.  HEENT: Normal.  Neck: Supple, no JVD, carotid bruits, or masses noted.  Cardiac: Regular rate and rhythm. No murmurs, rubs, or gallops. No edema.  Respiratory:  Lungs are clear to auscultation bilaterally with normal work of breathing.  GI: Soft and nontender.  MS: No deformity or atrophy. Gait and ROM intact.  Skin: Warm and dry. Color is normal.  Neuro:  Strength and sensation are intact and no gross focal deficits noted.  Psych: Alert, appropriate and with normal affect.   LABORATORY DATA:  EKG:  EKG is ordered today. This demonstrates NSR with prior inferior infarct - unchanged.  Today's tracing with less T wave changes. Reviewed with Dr. Lovena Le.   Lab Results  Component Value Date   WBC 12.8 (H) 10/05/2016   HGB 10.4 (L) 10/05/2016   HCT 32.0 (L) 10/05/2016   PLT 239 10/05/2016   GLUCOSE 155 (H) 10/04/2016   CHOL 118 05/13/2016   TRIG 107 05/13/2016   HDL 32 (L) 05/13/2016   LDLCALC 65 05/13/2016   ALT 28 09/22/2016   AST 31 09/22/2016   NA 138 10/04/2016   K 3.3 (L) 10/04/2016   CL 103 10/04/2016   CREATININE 1.09 10/04/2016   BUN 15 10/04/2016   CO2 26 10/04/2016   TSH 0.354 12/30/2015   PSA 0.37 08/31/2009   INR 0.95 09/22/2016   HGBA1C 7.6 (H) 09/22/2016   MICROALBUR 0.50 08/07/2009     BNP (last 3 results) No results for input(s): BNP in the last 8760 hours.  ProBNP (last 3 results) No results for input(s): PROBNP in the last 8760 hours.   Other Studies Reviewed  Today:  MYOVIEW FINDINGS 04/2016: Perfusion: There is a fixed defect noted inferiorly on rest and stress images compatible with old infarct/ scar. No reversibility to suggest ischemia  Wall Motion: Decreased wall motion and thickening inferiorly in the area of scar.  Left Ventricular Ejection Fraction: 44 %  End diastolic volume 413 ml  End systolic volume 62 ml  IMPRESSION: 1. No reversible ischemia. Old infarct/scar inferiorly.  2. Decreased wall motion and thickening inferiorly.  3. Left ventricular ejection fraction 44%  4. Non invasive risk stratification*: Intermediate  *2012 Appropriate Use Criteria for Coronary Revascularization Focused Update: J Am Coll Cardiol. 2440;10(2):725-366. http://content.airportbarriers.com.aspx?articleid=1201161   Electronically Signed By: Rolm Baptise M.D. On: 05/01/2016 16:10  Echo Study Conclusions from 12/2015  - Left ventricle: The cavity size was normal. There was mild concentric hypertrophy. Systolic function was normal. The estimated ejection fraction was in the range of 55% to 60%. There is akinesis of the basal-midinferior myocardium. Doppler parameters are consistent with abnormal left  ventricular relaxation (grade 1 diastolic dysfunction).   Cardiac Cath Conclusion from 07/2015  1. Prox LAD to Mid LAD lesion, 20% stenosed. 2. Prox Cx to Mid Cx lesion, 20% stenosed. 3. Ost 1st Mrg to 1st Mrg lesion, 50% stenosed.   Widely patent right coronary artery including recently placed proximal stent at the time of acute coronary intervention.  Irregularities noted in the proximal and mid LAD as well as circumflex. There is up to 50% stenosis in the proximal segment of the large first obtuse marginal.  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 Overall LV function is moderately decreased with an EF of 35-40%. LVEDP 23 mmHg.  No obvious explanation for the patient's ongoing  chest pain complaints.     Assessment/Plan:  1. Chest pain - has known CAD with prior PCI to the RCA and with known residual disease - last Myoview low risk from March noted. Now with several days of not feeling well - short of breath/chest pain - has had chronically elevated troponins. He says this is identical to his presentation with his MI in 2017. He has had noted elevated troponins in the record.  [facility] admission with plans to proceed with cardiac cath tomorrow. He is agreeable. Discussed with Dr. Lovena Le here in the office who concurs. Will try adding low dose beta Mcclure.   2. HTN - BP is ok on his current regimen.   3. Diastolic dysfunction with prior systolic dysfunction - EF now normal by last echo but at 48% per recent Myoview - would favor continued medical therapy.   4. HLD - on statin therapy   5  Prior syncope - had medicines reduced in the past - no recurrence.   6. Prior hip surgery from August - no longer on anticoagulation.   Current medicines are reviewed with the patient today.  The patient does not have concerns regarding medicines other than what has been noted above.  The following changes have been made:  See above.  Labs/ tests ordered today include:    Orders Placed This Encounter  Procedures  . EKG 12-Lead     Disposition:   To be admitted. Transferred by private car to telemetry bed at [facility].   Patient is agreeable to this plan and will call if any problems develop in the interim.   SignedTruitt Merle, NP  02/18/2017 1:47 PM  [facility] [address] [place], [place]  [address] Phone: [phone] Fax: [phone]

## 2017-02-18 NOTE — Telephone Encounter (Signed)
A scheduler from [facility] wanted to add pt on schedule stated was low in the 80's and bp was 117/72, stated this was normal.  Stated to go ahead and send caller through.  After talking with wife stating this was normal stated the pt had a heart attack and held out for four days before going to hospital.  Wife stated pt is having chest pain pt will not do anything or go to any doctor's to get checked.  Stated if having CP now per [name] to call EMS so pt can get a EKG.  [name] started raising voice.  Stated would have to talk to pt to assess. Pt picked up phone and stated was tired.  Asked if pt has any CP, pt stated not for a while, stated to clarify awhile, stated a couple days ago.  Asked pt if taken any nitro, pt stated yesterday with relief, stated pt just stated hasn't had CP in a while.  Asked pt if can come in today for 1:30 appt.  Pt said I don't think so. Stated that is the only time available.  Pt stated OK.  If pt has CP before appt to call EMS.

## 2017-02-18 NOTE — Patient Instructions (Addendum)
We are admitting you to the hospital today.   We are planning for cardiac catheterization tomorrow.   Call the [facility] office at [phone] if you have any questions, problems or concerns.

## 2017-02-18 NOTE — Progress Notes (Addendum)
CARDIOLOGY OFFICE NOTE  Date:  02/18/2017    Thomas Mcclure Date of Birth: Jan 10, 1956 Medical Record #[id number]  PCP:  Thomas Blocker, MD  Cardiologist:  Thomas Mcclure  Chief Complaint  Patient presents with  . Chest Pain    Work in visit - seen for Dr. Tamala Mcclure    History of Present Illness: Thomas Mcclure is a 61 y.o. male who presents today for a work in visit.   He has a history of NSTEMI in 5-2017status post DES to the RCA complicated by LV aneurysm and chronic systolic heart RJJOACZ(YS06-30 percent by echo on 12/31/15, but prior to this was 45-50 percent), diabetes, hypertension, hyperlipidemia, and chronic back pain status post spinal stimulator. His last cath was in June of 2017 - see below - has continued with medical therapy. He has been noted to have chronically elevated troponin levels.   Syncopal spell last year - medicines were reduced down.   Admitted 3/7/18with recurrent atypical chest pain. He recently stopped and then restarted his Clopidogrel for a lumbar myelogram, and he became concerned that his stent may have occluded and as such took 2 nitroglycerin tabs with no effect and then presented to the emergency department. EKG showed nonspecific T-wave changes in V3. His first set of enzymes was negative.  He was admitted and monitored on telemetry. He underwent Lexiscan which showed no reversible ischemia, old infarct/scar, and EF of 44%. Per Dr. Marlou Mcclure: No further evaluation needed, follow-up with cardiologist as an outpatient.  He last saw Dr. Tamala Mcclure in October - he had had hip surgery and did ok. Cardiac status was ok.   Several phone calls today noted -   1st phone call - Patient's wife called to report the patient fell late last week and has been having problems ever since. He was never assessed by a medical professional after the fall. She has been checking his VS and reports they have been very low. When asked values, she reported BPs of  120s/70s and HR in the 80s. Attempted to tell her these results are normal and to ask if he is experiencing symptoms, but Thomas Mcclure interrupted several times and would not allow caller to speak.    She reports "his chest must be hurting since the fall" but she does not want to take him the ED to "stay there all day." She states he has not directly told her that his chest hurts - she "just knows." After several interruptions by Thomas Mcclure, instructed her to call PCP for evaluation - he may have experienced some trauma from the fall that needs follow-up and if Thomas Mcclure suspects cardiac trouble he will call the office. Thomas Mcclure exclaimed, "FIne. But Dr. Tamala Mcclure would not like this" and hung up the phone.  2nd phone call - A scheduler from [facility] wanted to add pt on schedule stated was low in the 80's and bp was 117/72, stated this was normal.  Stated to go ahead and send caller through.  After talking with wife stating this was normal stated the pt had a heart attack and held out for four days before going to hospital.  Wife stated pt is having chest pain pt will not do anything or go to any doctor's to get checked.  Stated if having CP now per Thomas Mcclure to call EMS so pt can get a EKG.  [name] started raising voice.  Stated would have to talk to pt to assess. Pt picked  up phone and stated was tired.  Asked if pt has any CP, pt stated not for a while, stated to clarify awhile, stated a couple days ago.  Asked pt if taken any nitro, pt stated yesterday with relief, stated pt just stated hasn't had CP in a while.  Asked pt if can come in today for 1:30 appt.  Pt said I don't think so. Stated that is the only time available.  Pt stated OK.  If pt has CP before appt to call EMS.  Comes in today. Here with his wife - Thomas Mcclure. History quite hard to follow. They are arguing with each other during the visit. He says he is fine. She does most of the talking. She is under the impression that his HR needs to be "80 to 100".  His  balance is poor. He is falling. Appetite is poor. He fell over the weekend. He is no longer on Eliquis. Says Dr. Tamala Mcclure stopped this - I do not see this - suspect he finished his course following his hip surgery. He then tells me that he has not felt well over the past few days. Few "spurts of chest pain". Took NTG - that helped him yesterday. Chest feels sore. He feels sore. He tells me that he feels exactly like he did when he had his MI. Wife notes shortness of breath, gasping for breathing,  "holding his chest", etc. Says just "weak" now. Explained that admission will now be needed - he then said "let me change my story".    Past Medical History:  Diagnosis Date  . Arthritis    Rheumatoid  . Baker's cyst    Left calf  . CAD in native artery, 07/15/15 PCI of RCA with DES 07/16/2015   a.   NSTEMI 5/17: LHC - pLAD 20, pLCx 20, OM1 30, pRCA 100, EF 45-50%>> PCI: 2.5 x 24 mm Promus DES to RCA  //  b.   Echo 5/17: mild LVH, EF 50-55%, no RWMA, mod RVE //  c. LHC 6/17: pLAD 20, pLCx 20, OM1 50, pRCA stent ok, EF 35-45% with mod sized inf wall and basal segment aneurysm  . Chronic back pain   . Cyst (solitary) of breast    ? cyst left calf ,knee  . Diabetes mellitus    diet controlled  . GERD (gastroesophageal reflux disease)   . Hyperlipidemia   . Hypertension    Thomas Mcclure [phone]  . Ischemic cardiomyopathy    a. LV-gram at time of LHC in 6/17 with EF 35-45%  //  b. Echo 7/17: EF 45-50%, inferior HK, grade 1 diastolic dysfunction, mildly dilated aortic root, moderately reduced RVSF, mild RAE  . Myocardial infarction ([place]) 2017  . 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 BACK SURGERY    . CARDIAC CATHETERIZATION N/A 07/15/2015   Procedure: Left Heart Cath and Coronary Angiography;  Surgeon: Thomas M Martinique, MD;  Location: [facility];  Service: Cardiovascular;  Laterality: N/A;  . CARDIAC CATHETERIZATION N/A  07/15/2015   Procedure: Coronary Stent Intervention;  Surgeon: Thomas M Martinique, MD;  Location: [facility];  Service: Cardiovascular;  Laterality: N/A;  . CARDIAC CATHETERIZATION N/A 08/07/2015   Procedure: Left Heart Cath and Coronary Angiography;  Surgeon: [name], MD;  Location: [facility];  Service: Cardiovascular;  Laterality: N/A;  . CORONARY STENT PLACEMENT  07/2015  .  KNEE SURGERY    . LUMBAR SPINE SURGERY  03/2009  & 2012  . MASS EXCISION N/A 04/19/2012   Procedure: removal of posterior cervical lipoma;  Surgeon: [name], MD;  Location: [facility];  Service: Neurosurgery;  Laterality: N/A;  Removal of posterior cervical lipoma  . SPINAL CORD STIMULATOR INSERTION N/A 01/07/2013   Procedure:  SPINAL CORD STIMULATOR INSERTION;  Surgeon: [name], MD;  Location: [facility];  Service: Neurosurgery;  Laterality: N/A;  . TOTAL HIP ARTHROPLASTY Right 10/03/2016   Procedure: TOTAL HIP ARTHROPLASTY;  Surgeon: [name], MD;  Location: [facility];  Service: Orthopedics;  Laterality: Right;  . WISDOM TOOTH EXTRACTION     hx of     Medications: Current Meds  Medication Sig  . aspirin EC 81 MG tablet Take 1 tablet (81 mg total) by mouth daily.  Marland Kitchen atorvastatin (LIPITOR) 80 MG tablet TAKE 1 TABLET BY MOUTH EVERY DAY AT 6PM  . chlorthalidone (HYGROTON) 25 MG tablet Take 1 tablet (25 mg total) by mouth daily.  . cyclobenzaprine (FLEXERIL) 10 MG tablet Take 10 mg by mouth daily as needed for muscle spasms.  . diazepam (VALIUM) 10 MG tablet Take 10 mg by mouth every evening. May take an additional 10 mg daily as needed for muscle spasms  . DILT-XR 180 MG 24 hr capsule Take 180 mg by mouth once daily  . DULoxetine (CYMBALTA) 60 MG capsule Take 60 mg by mouth daily.  Marland Kitchen lisinopril (PRINIVIL,ZESTRIL) 20 MG tablet Take 20 mg by mouth every evening.   . metFORMIN (GLUCOPHAGE-XR) 750 MG 24 hr tablet Take 750 mg by mouth daily with supper.   . Multiple Vitamin  (MULTIVITAMIN WITH MINERALS) TABS tablet Take 1 tablet by mouth daily.  . nitroGLYCERIN (NITROSTAT) 0.4 MG SL tablet PLACE 1 TABLET UNDER THE TONGUE EVERY 5 MINUTES AS NEEDED FOR CHEST PAIN. CALL 911 AT THIRD DOSE WITHIN 15 MINUTES  . omeprazole (PRILOSEC) 40 MG capsule Take 40 mg by mouth daily.  . Oxycodone HCl 10 MG TABS Take 1 tablet (10 mg total) by mouth every 4 (four) hours as needed (pain). scheduled  . polyethylene glycol powder (GLYCOLAX/MIRALAX) powder Take 17 g by mouth daily as needed (constipation). Mix in 8 oz water, juice, soda, coffee or tea and drink  . potassium chloride Mcclure (K-DUR,KLOR-CON) 20 MEQ tablet Take 1 tablet (20 mEq total) by mouth daily.  . pregabalin (LYRICA) 100 MG capsule Take 100-200 mg by mouth 2 (two) times daily. Take 100 mg in the morning and 200 mg at night  . traMADol (ULTRAM) 50 MG tablet Take 50 mg by mouth 3 (three) times daily. scheduled  . triamcinolone ointment (KENALOG) 0.5 % Apply 1 application topically 2 (two) times daily.     Allergies: Allergies  Allergen Reactions  . Brilinta [Ticagrelor] Shortness Of Breath and Other (See Comments)    CHEST PAIN   . Methylprednisolone Other (See Comments)    DIAPHORESIS HYPOTENSION  . Prednisone Other (See Comments)    DIAPHORESIS HYPOTENSION  . Toradol [Ketorolac Tromethamine] Other (See Comments)    DIAPHORESIS HYPOTENSION    . Adhesive [Tape] Rash and Other (See Comments)    "blisters" ( NO SURGICAL TAPE, PLEASE)    Social History: The patient  reports that he has quit smoking. His smoking use included cigarettes. He quit after 15.00 years of use. He quit smokeless tobacco use about 22 months ago. He reports that he does not drink alcohol or use drugs.  Family History: The patient's family history includes Heart disease in his mother; Prostate cancer in his brother.   Review of Systems: Please see the history of present illness.   Otherwise, the review of systems is positive for none.    All other systems are reviewed and negative.   Physical Exam: VS:  BP 120/80 (BP Location: Left Arm, Patient Position: Sitting, Cuff Size: Normal)   Pulse 75   Ht 6' (1.829 m)   Wt 252 lb 1.9 oz (114.4 kg)   BMI 34.19 kg/m  .  BMI Body mass index is 34.19 kg/m.  Wt Readings from Last 3 Encounters:  02/18/17 252 lb 1.9 oz (114.4 kg)  11/28/16 256 lb 6.4 oz (116.3 kg)  09/22/16 252 lb 12.8 oz (114.7 kg)    General: Alert. He is in no acute distress.  He is of large stature.  HEENT: Normal.  Neck: Supple, no JVD, carotid bruits, or masses noted.  Cardiac: Regular rate and rhythm. No murmurs, rubs, or gallops. No edema.  Respiratory:  Lungs are clear to auscultation bilaterally with normal work of breathing.  GI: Soft and nontender.  MS: No deformity or atrophy. Gait and ROM intact.  Skin: Warm and dry. Color is normal.  Neuro:  Strength and sensation are intact and no gross focal deficits noted.  Psych: Alert, appropriate and with normal affect.   LABORATORY DATA:  EKG:  EKG is ordered today. This demonstrates NSR with prior inferior infarct - unchanged.  Today's tracing with less T wave changes. Reviewed with Dr. Lovena Le.   Lab Results  Component Value Date   WBC 12.8 (H) 10/05/2016   HGB 10.4 (L) 10/05/2016   HCT 32.0 (L) 10/05/2016   PLT 239 10/05/2016   GLUCOSE 155 (H) 10/04/2016   CHOL 118 05/13/2016   TRIG 107 05/13/2016   HDL 32 (L) 05/13/2016   LDLCALC 65 05/13/2016   ALT 28 09/22/2016   AST 31 09/22/2016   NA 138 10/04/2016   K 3.3 (L) 10/04/2016   CL 103 10/04/2016   CREATININE 1.09 10/04/2016   BUN 15 10/04/2016   CO2 26 10/04/2016   TSH 0.354 12/30/2015   PSA 0.37 08/31/2009   INR 0.95 09/22/2016   HGBA1C 7.6 (H) 09/22/2016   MICROALBUR 0.50 08/07/2009     BNP (last 3 results) No results for input(s): BNP in the last 8760 hours.  ProBNP (last 3 results) No results for input(s): PROBNP in the last 8760 hours.   Other Studies Reviewed  Today:  MYOVIEW FINDINGS 04/2016: Perfusion: There is a fixed defect noted inferiorly on rest and stress images compatible with old infarct/ scar. No reversibility to suggest ischemia  Wall Motion: Decreased wall motion and thickening inferiorly in the area of scar.  Left Ventricular Ejection Fraction: 44 %  End diastolic volume 188 ml  End systolic volume 62 ml  IMPRESSION: 1. No reversible ischemia. Old infarct/scar inferiorly.  2. Decreased wall motion and thickening inferiorly.  3. Left ventricular ejection fraction 44%  4. Non invasive risk stratification*: Intermediate  *2012 Appropriate Use Criteria for Coronary Revascularization Focused Update: J Am Coll Cardiol. 4166;06(3):016-010. http://content.airportbarriers.com.aspx?articleid=1201161   Electronically Signed By: Rolm Baptise M.D. On: 05/01/2016 16:10  Echo Study Conclusions from 12/2015  - Left ventricle: The cavity size was normal. There was mild concentric hypertrophy. Systolic function was normal. The estimated ejection fraction was in the range of 55% to 60%. There is akinesis of the basal-midinferior myocardium. Doppler parameters are consistent with abnormal left  ventricular relaxation (grade 1 diastolic dysfunction).   Cardiac Cath Conclusion from 07/2015  1. Prox LAD to Mid LAD lesion, 20% stenosed. 2. Prox Cx to Mid Cx lesion, 20% stenosed. 3. Ost 1st Mrg to 1st Mrg lesion, 50% stenosed.   Widely patent right coronary artery including recently placed proximal stent at the time of acute coronary intervention.  Irregularities noted in the proximal and mid LAD as well as circumflex. There is up to 50% stenosis in the proximal segment of the large first obtuse marginal.  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 Overall LV function is moderately decreased with an EF of 35-40%. LVEDP 23 mmHg.  No obvious explanation for the patient's ongoing  chest pain complaints.     Assessment/Plan:  1. Chest pain - has known CAD with prior PCI to the RCA and with known residual disease - last Myoview low risk from March noted. Now with several days of not feeling well - short of breath/chest pain - has had chronically elevated troponins. He says this is identical to his presentation with his MI in 2017. He has had noted elevated troponins in the record.  [place] admission with plans to proceed with cardiac cath tomorrow. He is agreeable. Discussed with Dr. Lovena Le here in the office who concurs. Will try adding low dose beta Mcclure.   2. HTN - BP is ok on his current regimen.   3. Diastolic dysfunction with prior systolic dysfunction - EF now normal by last echo but at 48% per recent Myoview - would favor continued medical therapy.   4. HLD - on statin therapy   5  Prior syncope - had medicines reduced in the past - no recurrence.   6. Prior hip surgery from August - no longer on anticoagulation.   Current medicines are reviewed with the patient today.  The patient does not have concerns regarding medicines other than what has been noted above.  The following changes have been made:  See above.  Labs/ tests ordered today include:    Orders Placed This Encounter  Procedures  . EKG 12-Lead     Disposition:   To be admitted. Transferred by private car to telemetry bed at [facility].   Patient is agreeable to this plan and will call if any problems develop in the interim.   SignedTruitt Merle, NP  02/18/2017 1:47 PM  [place] [address] [place], [place]  [address] Phone: [phone] Fax: [phone]

## 2017-02-19 ENCOUNTER — Encounter (HOSPITAL_COMMUNITY): Payer: Self-pay | Admitting: Cardiology

## 2017-02-19 ENCOUNTER — Other Ambulatory Visit: Payer: Self-pay

## 2017-02-19 ENCOUNTER — Ambulatory Visit (HOSPITAL_COMMUNITY): Admission: RE | Admit: 2017-02-19 | Payer: Medicare Other | Source: Ambulatory Visit | Admitting: Cardiology

## 2017-02-19 ENCOUNTER — Encounter (HOSPITAL_COMMUNITY)
Admission: AD | Disposition: A | Discharge status: Home / Self Care | Payer: Self-pay | Source: Ambulatory Visit | Attending: Internal Medicine

## 2017-02-19 DIAGNOSIS — E876 Hypokalemia: Secondary | ICD-10-CM | POA: Diagnosis not present

## 2017-02-19 DIAGNOSIS — R0602 Shortness of breath: Secondary | ICD-10-CM | POA: Diagnosis not present

## 2017-02-19 DIAGNOSIS — I200 Unstable angina: Secondary | ICD-10-CM | POA: Diagnosis not present

## 2017-02-19 DIAGNOSIS — Z955 Presence of coronary angioplasty implant and graft: Secondary | ICD-10-CM | POA: Diagnosis not present

## 2017-02-19 DIAGNOSIS — R079 Chest pain, unspecified: Secondary | ICD-10-CM | POA: Diagnosis not present

## 2017-02-19 DIAGNOSIS — I25110 Atherosclerotic heart disease of native coronary artery with unstable angina pectoris: Secondary | ICD-10-CM

## 2017-02-19 DIAGNOSIS — I2510 Atherosclerotic heart disease of native coronary artery without angina pectoris: Secondary | ICD-10-CM | POA: Diagnosis not present

## 2017-02-19 HISTORY — PX: LEFT HEART CATH AND CORONARY ANGIOGRAPHY: CATH118249

## 2017-02-19 LAB — LIPID PANEL
Cholesterol: 107 mg/dL (ref 0–200)
HDL: 25 mg/dL — ABNORMAL LOW (ref >40)
LDL Cholesterol: 56 mg/dL (ref 0–99)
Total CHOL/HDL Ratio: 4.3 RATIO
Triglycerides: 131 mg/dL (ref <150)
VLDL: 26 mg/dL (ref 0–40)

## 2017-02-19 LAB — CBC
HCT: 38 % — ABNORMAL LOW (ref 39.0–52.0)
Hemoglobin: 12.7 g/dL — ABNORMAL LOW (ref 13.0–17.0)
MCH: 26.3 pg (ref 26.0–34.0)
MCHC: 33.4 g/dL (ref 30.0–36.0)
MCV: 78.8 fL (ref 78.0–100.0)
Platelets: 279 10*3/uL (ref 150–400)
RBC: 4.82 MIL/uL (ref 4.22–5.81)
RDW: 18.2 % — ABNORMAL HIGH (ref 11.5–15.5)
WBC: 8.5 10*3/uL (ref 4.0–10.5)

## 2017-02-19 LAB — GLUCOSE, CAPILLARY: Glucose-Capillary: 110 mg/dL — ABNORMAL HIGH (ref 65–99)

## 2017-02-19 LAB — BASIC METABOLIC PANEL
Anion gap: 8 (ref 5–15)
BUN: 10 mg/dL (ref 6–20)
CO2: 27 mmol/L (ref 22–32)
Calcium: 9.1 mg/dL (ref 8.9–10.3)
Chloride: 102 mmol/L (ref 101–111)
Creatinine, Ser: 1.12 mg/dL (ref 0.61–1.24)
GFR calc Af Amer: >60 mL/min (ref >60)
GFR calc non Af Amer: >60 mL/min (ref >60)
Glucose, Bld: 144 mg/dL — ABNORMAL HIGH (ref 65–99)
Potassium: 3.2 mmol/L — ABNORMAL LOW (ref 3.5–5.1)
Sodium: 137 mmol/L (ref 135–145)

## 2017-02-19 LAB — TROPONIN I
Troponin I: 0.03 ng/mL (ref <0.03)
Troponin I: <0.03 ng/mL (ref <0.03)

## 2017-02-19 LAB — HEPARIN LEVEL (UNFRACTIONATED): Heparin Unfractionated: 0.13 IU/mL — ABNORMAL LOW (ref 0.30–0.70)

## 2017-02-19 SURGERY — LEFT HEART CATH AND CORONARY ANGIOGRAPHY
Anesthesia: LOCAL

## 2017-02-19 MED ORDER — PREGABALIN 100 MG PO CAPS: 200.0000 mg | ORAL_CAPSULE | Freq: Every day | ORAL | Status: DC

## 2017-02-19 MED ORDER — SODIUM CHLORIDE 0.9 % WEIGHT BASED INFUSION
1.0000 mL/kg/h | INTRAVENOUS | Status: AC
  Administered 2017-02-19: 1 mL/kg/h via INTRAVENOUS

## 2017-02-19 MED ORDER — HEPARIN SODIUM (PORCINE) 1000 UNIT/ML IJ SOLN
INTRAMUSCULAR | Status: DC | PRN
  Administered 2017-02-19: 5000 [IU] via INTRAVENOUS

## 2017-02-19 MED ORDER — HEPARIN BOLUS VIA INFUSION
3000.0000 [IU] | Freq: Once | INTRAVENOUS | Status: AC
  Administered 2017-02-19: 3000 [IU] via INTRAVENOUS
  Filled 2017-02-19: qty 3000

## 2017-02-19 MED ORDER — ACETAMINOPHEN 325 MG PO TABS: 650.0000 mg | ORAL_TABLET | ORAL | Status: DC | PRN

## 2017-02-19 MED ORDER — LISINOPRIL 10 MG PO TABS
20.0000 mg | ORAL_TABLET | Freq: Every evening | ORAL | Status: DC
  Administered 2017-02-19: 20 mg via ORAL
  Filled 2017-02-19: qty 2

## 2017-02-19 MED ORDER — ADULT MULTIVITAMIN W/MINERALS CH: 1.0000 | ORAL_TABLET | Freq: Every day | ORAL | Status: DC

## 2017-02-19 MED ORDER — PREGABALIN 100 MG PO CAPS: 100.0000 mg | ORAL_CAPSULE | Freq: Every morning | ORAL | Status: DC

## 2017-02-19 MED ORDER — PREGABALIN 100 MG PO CAPS: 100.0000 mg | ORAL_CAPSULE | Freq: Two times a day (BID) | ORAL | Status: DC

## 2017-02-19 MED ORDER — SODIUM CHLORIDE 0.9% FLUSH: 3.0000 mL | Freq: Two times a day (BID) | INTRAVENOUS | Status: DC

## 2017-02-19 MED ORDER — PANTOPRAZOLE SODIUM 40 MG PO TBEC: 40.0000 mg | DELAYED_RELEASE_TABLET | Freq: Every day | ORAL | Status: DC

## 2017-02-19 MED ORDER — LIDOCAINE HCL (PF) 1 % IJ SOLN
INTRAMUSCULAR | Status: DC | PRN
  Administered 2017-02-19: 2 mL

## 2017-02-19 MED ORDER — ONDANSETRON HCL 4 MG/2ML IJ SOLN: 4.0000 mg | Freq: Four times a day (QID) | INTRAMUSCULAR | Status: DC | PRN

## 2017-02-19 MED ORDER — FENTANYL CITRATE (PF) 100 MCG/2ML IJ SOLN
INTRAMUSCULAR | Status: DC | PRN
  Administered 2017-02-19: 25 ug via INTRAVENOUS

## 2017-02-19 MED ORDER — SODIUM CHLORIDE 0.9% FLUSH: 3.0000 mL | INTRAVENOUS | Status: DC | PRN

## 2017-02-19 MED ORDER — ATORVASTATIN CALCIUM 80 MG PO TABS
80.0000 mg | ORAL_TABLET | Freq: Every day | ORAL | Status: DC
  Administered 2017-02-19: 80 mg via ORAL
  Filled 2017-02-19: qty 1

## 2017-02-19 MED ORDER — MIDAZOLAM HCL 2 MG/2ML IJ SOLN
INTRAMUSCULAR | Status: DC | PRN
  Administered 2017-02-19: 1 mg via INTRAVENOUS

## 2017-02-19 MED ORDER — POTASSIUM CHLORIDE CRYS ER 20 MEQ PO TBCR
40.0000 meq | EXTENDED_RELEASE_TABLET | Freq: Once | ORAL | Status: AC
  Administered 2017-02-19: 40 meq via ORAL
  Filled 2017-02-19: qty 2

## 2017-02-19 MED ORDER — HEPARIN (PORCINE) IN NACL 2-0.9 UNIT/ML-% IJ SOLN
INTRAMUSCULAR | Status: AC | PRN
  Administered 2017-02-19: 1000 mL

## 2017-02-19 MED ORDER — IOPAMIDOL (ISOVUE-370) INJECTION 76%
INTRAVENOUS | Status: DC | PRN
  Administered 2017-02-19: 70 mL via INTRA_ARTERIAL

## 2017-02-19 MED ORDER — NITROGLYCERIN 0.3 MG SL SUBL
0.3000 mg | SUBLINGUAL_TABLET | SUBLINGUAL | Status: DC | PRN
  Filled 2017-02-19: qty 100

## 2017-02-19 MED ORDER — VERAPAMIL HCL 2.5 MG/ML IV SOLN
INTRAVENOUS | Status: DC | PRN
  Administered 2017-02-19: 10 mL via INTRA_ARTERIAL

## 2017-02-19 MED ORDER — DULOXETINE HCL 60 MG PO CPEP: 60.0000 mg | ORAL_CAPSULE | Freq: Every day | ORAL | Status: DC

## 2017-02-19 MED ORDER — SODIUM CHLORIDE 0.9 % IV SOLN: 250.0000 mL | INTRAVENOUS | Status: DC | PRN

## 2017-02-19 SURGICAL SUPPLY — 12 items
CATH INFINITI 5 FR JL3.5 (CATHETERS) ×1 IMPLANT
CATH INFINITI 5FR ANG PIGTAIL (CATHETERS) ×1 IMPLANT
CATH INFINITI JR4 5F (CATHETERS) ×1 IMPLANT
DEVICE RAD COMP TR BAND LRG (VASCULAR PRODUCTS) ×1 IMPLANT
GLIDESHEATH SLEND SS 6F .021 (SHEATH) ×1 IMPLANT
GUIDEWIRE INQWIRE 1.5J.035X260 (WIRE) IMPLANT
INQWIRE 1.5J .035X260CM (WIRE) ×2
KIT HEART LEFT (KITS) ×2 IMPLANT
PACK CARDIAC CATHETERIZATION (CUSTOM PROCEDURE TRAY) ×2 IMPLANT
SYR MEDRAD MARK V 150ML (SYRINGE) ×2 IMPLANT
TRANSDUCER W/STOPCOCK (MISCELLANEOUS) ×2 IMPLANT
TUBING CIL FLEX 10 FLL-RA (TUBING) ×2 IMPLANT

## 2017-02-19 NOTE — Interval H&P Note (Signed)
History and Physical Interval Note:  02/19/2017 11:25 AM  Thomas Mcclure  has presented today for surgery, with the diagnosis of cp  The various methods of treatment have been discussed with the patient and family. After consideration of risks, benefits and other options for treatment, the patient has consented to  Procedure(s): LEFT HEART CATH AND CORONARY ANGIOGRAPHY (N/A) as a surgical intervention .  The patient's history has been reviewed, patient examined, no change in status, stable for surgery.  I have reviewed the patient's chart and labs.  Questions were answered to the patient's satisfaction.   Cath Lab Visit (complete for each Cath Lab visit)  Clinical Evaluation Leading to the Procedure:   ACS: No.  Non-ACS:    Anginal Classification: CCS III  Anti-ischemic medical therapy: No Therapy  Non-Invasive Test Results: No non-invasive testing performed  Prior CABG: No previous CABG        [name] [facility] 02/19/2017 11:26 AM

## 2017-02-19 NOTE — Progress Notes (Signed)
LAB CALL WITH CRITICAL LAB TROP. 0.03 . POTASSIUM IS 3.2 DR. HASSETT TEXT PAGE

## 2017-02-19 NOTE — Progress Notes (Signed)
Dr. Baxter Hire is not on call for [facility]. Will have 1st shift R.N. Make M.D. Aware of labs.Marland Kitchen

## 2017-02-19 NOTE — Progress Notes (Signed)
Progress Note  Patient Name: Thomas Mcclure Date of Encounter: 02/19/2017  Primary Cardiologist: Linard Millers  Subjective   No problems overnight.  Inpatient Medications    Scheduled Meds: . aspirin EC  81 mg Oral Daily  . atorvastatin  80 mg Oral q1800  . chlorthalidone  25 mg Oral Daily  . diltiazem  180 mg Oral Daily  . DULoxetine  60 mg Oral Daily  . lisinopril  20 mg Oral Daily  . metoprolol tartrate  12.5 mg Oral BID  . nitroGLYCERIN  1 inch Topical Q8H  . pantoprazole  40 mg Oral Daily  . polyethylene glycol  17 g Oral Daily  . potassium chloride  20 mEq Oral Daily  . pregabalin  100 mg Oral q morning - 10a  . pregabalin  200 mg Oral QHS  . sodium chloride flush  3 mL Intravenous Q12H   Continuous Infusions: . sodium chloride    . sodium chloride 10 mL/hr at 02/18/17 1910  . heparin 1,600 Units/hr (02/19/17 0834)   PRN Meds: sodium chloride, acetaminophen, nitroGLYCERIN, ondansetron (ZOFRAN) IV, oxyCODONE, sodium chloride flush, traMADol   Vital Signs    Vitals:   02/18/17 1941 02/19/17 0332 02/19/17 0546 02/19/17 0810  BP: 117/83 99/66 112/69   Pulse: 71 61 (!) 59 72  Resp:  14 10   Temp: 97.6 F (36.4 C) 97.6 F (36.4 C)    TempSrc: Oral Oral    SpO2: 98% 97% 97%   Weight:  245 lb (111.1 kg)    Height:        Intake/Output Summary (Last 24 hours) at 02/19/2017 1121 Last data filed at 02/19/2017 0600 Gross per 24 hour  Intake 580 ml  Output 900 ml  Net -320 ml   Filed Weights   02/18/17 1700 02/19/17 0332  Weight: 252 lb 3.3 oz (114.4 kg) 245 lb (111.1 kg)    Telemetry    Normal sinus rhythm- Personally Reviewed  ECG     Normal sinus rhythm with evidence of an old inferior infarct.  No acute changes.  Tracing performed 02/19/2017.  When compared to the office tracing of 02/18/2017, no significant evolution.- Personally Reviewed  Physical Exam   GEN: No acute distress.   Neck: No JVD Cardiac: RRR, no murmurs, rubs, or gallops.    Respiratory: Clear to auscultation bilaterally. GI: Soft, nontender, non-distended  MS: No edema; No deformity. Neuro:  Nonfocal  Psych: Normal affect   Labs    Chemistry Recent Labs  Lab 02/18/17 1704 02/19/17 0222  NA 137 137  K 3.5 3.2*  CL 98* 102  CO2 27 27  GLUCOSE 128* 144*  BUN 10 10  CREATININE 1.01 1.12  CALCIUM 9.8 9.1  PROT 7.5  --   ALBUMIN 4.2  --   AST 25  --   ALT 26  --   ALKPHOS 86  --   BILITOT 0.6  --   GFRNONAA >60 >60  GFRAA >60 >60  ANIONGAP 12 8     Hematology Recent Labs  Lab 02/18/17 1704 02/19/17 0222  WBC 8.5 8.5  RBC 5.04 4.82  HGB 13.0 12.7*  HCT 40.0 38.0*  MCV 79.4 78.8  MCH 25.8* 26.3  MCHC 32.5 33.4  RDW 18.1* 18.2*  PLT 291 279    Cardiac Enzymes Recent Labs  Lab 02/18/17 1704 02/19/17 0222 02/19/17 0846  TROPONINI <0.03 0.03* <0.03   No results for input(s): TROPIPOC in the last 168 hours.  BNP Recent Labs  Lab 02/18/17 1704  BNP 14.6     DDimer No results for input(s): DDIMER in the last 168 hours.   Radiology    No results found.  Cardiac Studies   Coronary angiography 08/07/15 Coronary Diagrams   Diagnostic Diagram          Patient Profile     61 y.o. male has a history of NSTEMI in 06-2015 status post DES to the RCA complicated by LV aneurysm and chronic systolic heart failure (EF 55-60 percent by echo on 12/31/15, but prior to this was 45-50 percent), diabetes, hypertension, hyperlipidemia, and chronic back pain status post spinal stimulator. His last cath was in June of 2017 .  Admitted from office on 02/18/2017 with recurring episodes of chest pain but atypical history.   Assessment & Plan    1. Hypokalemia needs repletion.  Was given 40 mEq at 7:30 AM 2. Coronary artery disease with prior non-ST elevation MI and right coronary DES 2017.  No evidence of infarction or EKG evolution. 3. Chest discomfort, somewhat atypical, plan coronary angiography to define anatomy, rule out in-stent  restenosis/progression of de novo disease.  Await results of today's procedure.  If unremarkable, likely discharge later today.  For questions or updates, please contact [place] Please consult [email or web address] for contact info under Cardiology/STEMI.      Signed, Sinclair Grooms, MD  02/19/2017, 11:21 AM

## 2017-02-19 NOTE — Progress Notes (Signed)
Order received to discharge patient.  Telemetry monitor removed and CCMD notified.  PIV access removed.  Discharge instructions, follow up, medications and instructions for their use discussed with patient. 

## 2017-02-19 NOTE — Progress Notes (Signed)
ANTICOAGULATION CONSULT NOTE - F/u Consult  Pharmacy Consult for heparin Indication: chest pain/ACS  Allergies  Allergen Reactions  . Brilinta [Ticagrelor] Shortness Of Breath and Other (See Comments)    CHEST PAIN   . Methylprednisolone Other (See Comments)    DIAPHORESIS HYPOTENSION  . Prednisone Other (See Comments)    DIAPHORESIS HYPOTENSION  . Toradol [Ketorolac Tromethamine] Other (See Comments)    DIAPHORESIS HYPOTENSION    . Adhesive [Tape] Rash and Other (See Comments)    "blisters" ( NO SURGICAL TAPE, PLEASE)    Patient Measurements: Height: 6' (182.9 cm) Weight: 252 lb 3.3 oz (114.4 kg) IBW/kg (Calculated) : 77.6 Heparin Dosing Weight: 102 kg  Vital Signs: Temp: 97.6 F (36.4 C) (12/26 1941) Temp Source: Oral (12/26 1941) BP: 117/83 (12/26 1941) Pulse Rate: 71 (12/26 1941)  Labs: Recent Labs    02/18/17 1704 02/19/17 0222  HGB 13.0 12.7*  HCT 40.0 38.0*  PLT 291 279  APTT 31  --   LABPROT 12.7  --   INR 0.96  --   HEPARINUNFRC  --  0.13*  CREATININE 1.01  --   TROPONINI <0.03  --     Estimated Creatinine Clearance: 100.3 mL/min (by C-G formula based on SCr of 1.01 mg/dL).   Medical History: Past Medical History:  Diagnosis Date  . Baker's cyst    Left calf  . CAD in native artery, 07/15/15 PCI of RCA with DES 07/16/2015   a.   NSTEMI 5/17: LHC - pLAD 20, pLCx 20, OM1 30, pRCA 100, EF 45-50%>> PCI: 2.5 x 24 mm Promus DES to RCA  //  b.   Echo 5/17: mild LVH, EF 50-55%, no RWMA, mod RVE //  c. LHC 6/17: pLAD 20, pLCx 20, OM1 50, pRCA stent ok, EF 35-45% with mod sized inf wall and basal segment aneurysm  . Chronic back pain    "down my back, down my legs" (02/18/2017)  . GERD (gastroesophageal reflux disease)   . Hyperlipidemia   . Hypertension    Dr. [name] [phone]  . Ischemic cardiomyopathy    a. LV-gram at time of LHC in 6/17 with EF 35-45%  //  b. Echo 7/17: EF 45-50%, inferior HK, grade 1 diastolic dysfunction, mildly dilated  aortic root, moderately reduced RVSF, mild RAE  . Myocardial infarction ([place]) 2017  . 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 Numbness and tingling of both lower extremities    "on the outside of both sides" (02/18/2017)  . Rheumatoid arthritis ([place])   . Type II diabetes mellitus ([facility])    diet controlled   Assessment: 61 y/o male with with chest pain. Pharmacy consulted to dose heparin. For cath 12/27.   Initial heparin level subtherapeutic at 0.13 and no infusion issues per RN. CBC stable and no s/s bleeding reported.   Noted patient was previously therapeutic at heparin 1600 units/hr in 2017, although with slightly lower heparin dosing weight of 94 kg.   Goal of Therapy:  Heparin level 0.3-0.7 units/ml Monitor platelets by anticoagulation protocol: Yes   Plan:  Heparin bolus 3000 units IV x1 Increase heparin gtt to 1600 units/hr  Heparin level in 6 hrs Daily heparin level and CBC Monitor for s/s bleeding F/u cardiology plan   Lavonda Jumbo, PharmD Clinical Pharmacist 02/19/17 3:33 AM

## 2017-02-19 NOTE — Discharge Summary (Signed)
The patient has been seen in conjunction with  Kathyrn Drown, NP. All aspects of care have been considered and discussed. The patient has been personally interviewed, examined, and all clinical data has been reviewed.   Symptoms are atypical.   Markers, ECG and clinical course are compatible with non-ischemic chest pain.  Discharge Summary    Patient ID: Thomas Mcclure,  MRN: [id number], DOB/AGE: [birth year] 61 y.o.  Admit date: 02/18/2017 Discharge date: 02/19/2017  Primary Care Provider: [name] Primary Cardiologist: [name]  Discharge Diagnoses    Principal Problem:   Unstable angina [facility]) Active Problems:   History of non-ST elevation myocardial infarction (NSTEMI)   CAD (coronary artery disease)   Hypokalemia  Allergies Allergies  Allergen Reactions  . Brilinta [Ticagrelor] Shortness Of Breath and Other (See Comments)    CHEST PAIN   . Methylprednisolone Other (See Comments)    DIAPHORESIS HYPOTENSION  . Prednisone Other (See Comments)    DIAPHORESIS HYPOTENSION  . Toradol [Ketorolac Tromethamine] Other (See Comments)    DIAPHORESIS HYPOTENSION    . Adhesive [Tape] Rash and Other (See Comments)    "blisters" ( NO SURGICAL TAPE, PLEASE)    Diagnostic Studies/Procedures   Cath 02/19/2017:   Prox LAD to Mid LAD lesion is 20% stenosed.  Prox Cx to Mid Cx lesion is 20% stenosed.  Ost 1st Mrg to 1st Mrg lesion is 50% stenosed.  Non-stenotic Prox RCA lesion previously treated.  The left ventricular systolic function is normal.  LV end diastolic pressure is normal.  The left ventricular ejection fraction is 50-55% by visual estimate.   1. Nonobstructive CAD. Patent stent in the RCA.  2. Overall good LV function 3. Normal LVEDP  Plan: continue medical therapy    History of Present Illness    61yo M with a hx of CAD with prior NSTEMI (06/2015) s/p DES to the RCA, chronic systolic heart failure with an EF 55-60% per Echo on 12/31/15,  DMII, HTN, HLD, back pain s/p spinal stimulator, and chronically elevated troponin levels presented for the evaluation of recurrent chest pain from the office on 02/19/2017.   He has been evaluated multiple times over the course of the last 2 years with a cardiac cath (07/2015) and Lexiscan stress test (04/30/2016) which have found no new ischemic areas, stable EF ( 50-55%) and a patent RCA stent.He was last seen in the office by [name] in October 2018. His cardiac status was stable at that time.    Hospital Course     - On 02/18/17, pt was admitted to the hospital after being seen in the office for recurring episodes of chest pain with an atypical history per patient and his wife.   - In the hospital, pt's troponin levels have remained negative (0.03, 0.03, 0.03), heparin gtt was initiated, and pt was scheduled for a cardiac cath on 02/19/17.     - Cath report revealed non-obstructive CAD with patent stent in the RCA, EF of 50-55% by visual estimate, and normal LVEDP with a plan to continue continue medical treatment.   Consultants: None  Pt was seen and examined by [name] and Dr. Martinique who feel that he is stable for discharge.  Discharge Vitals Blood pressure 112/82, pulse 70, temperature 97.6 F (36.4 C), temperature source Oral, resp. rate 11, height 6' (1.829 m), weight 245 lb (111.1 kg), SpO2 (!) 0 %.  Filed Weights   02/18/17 1700 02/19/17 0332  Weight: 252 lb 3.3 oz (  114.4 kg) 245 lb (111.1 kg)    Labs & Radiologic Studies    CBC Recent Labs    02/18/17 1704 02/19/17 0222  WBC 8.5 8.5  NEUTROABS 5.1  --   HGB 13.0 12.7*  HCT 40.0 38.0*  MCV 79.4 78.8  PLT 291 400   Basic Metabolic Panel Recent Labs    02/18/17 1704 02/19/17 0222  NA 137 137  K 3.5 3.2*  CL 98* 102  CO2 27 27  GLUCOSE 128* 144*  BUN 10 10  CREATININE 1.01 1.12  CALCIUM 9.8 9.1   Liver Function Tests Recent Labs    02/18/17 1704  AST 25  ALT 26  ALKPHOS 86  BILITOT 0.6  PROT 7.5    ALBUMIN 4.2   No results for input(s): LIPASE, AMYLASE in the last 72 hours. Cardiac Enzymes Recent Labs    02/18/17 1704 02/19/17 0222 02/19/17 0846  TROPONINI <0.03 0.03* <0.03   Hemoglobin A1C Recent Labs    02/18/17 1704  HGBA1C 7.4*   Fasting Lipid Panel Recent Labs    02/19/17 0222  CHOL 107  HDL 25*  LDLCALC 56  TRIG 131  CHOLHDL 4.3   Thyroid Function Tests Recent Labs    02/18/17 1704  TSH 0.589   _____________  No results found. Disposition   Pt is being discharged home today in good condition.  Follow-up Plans & Appointments     Follow-up Information    Isaiah Serge, NP Follow up on 03/10/2017.   Specialties:  Cardiology, Radiology Why:  Your follow up appointment is on 03/10/2017 at 0800 at the [facility] office with Cecilie Kicks who is a Designer, jewellery with [name].  Contact information: [address] [place] [address] [phone]           Discharge Medications   Allergies as of 02/19/2017      Reactions   Brilinta [ticagrelor] Shortness Of Breath, Other (See Comments)   CHEST PAIN    Methylprednisolone Other (See Comments)   DIAPHORESIS HYPOTENSION   Prednisone Other (See Comments)   DIAPHORESIS HYPOTENSION   Toradol [ketorolac Tromethamine] Other (See Comments)   DIAPHORESIS HYPOTENSION   Adhesive [tape] Rash, Other (See Comments)   "blisters" ( NO SURGICAL TAPE, PLEASE)      Medication List    TAKE these medications   aspirin EC 81 MG tablet Take 1 tablet (81 mg total) by mouth daily.   atorvastatin 80 MG tablet Commonly known as:  LIPITOR TAKE 1 TABLET BY MOUTH EVERY DAY AT 6PM What changed:  See the new instructions.   chlorthalidone 25 MG tablet Commonly known as:  HYGROTON Take 1 tablet (25 mg total) by mouth daily.   cyclobenzaprine 10 MG tablet Commonly known as:  FLEXERIL Take 10 mg by mouth daily as needed for muscle spasms.   diazepam 10 MG tablet Commonly known as:   VALIUM Take 10 mg by mouth every evening. May take an additional 10 mg daily as needed for muscle spasms   DILT-XR 180 MG 24 hr capsule Generic drug:  diltiazem Take 180 mg by mouth once daily   DULoxetine 60 MG capsule Commonly known as:  CYMBALTA Take 60 mg by mouth daily.   lisinopril 20 MG tablet Commonly known as:  PRINIVIL,ZESTRIL Take 20 mg by mouth every evening.   metFORMIN 750 MG 24 hr tablet Commonly known as:  GLUCOPHAGE-XR Take 750 mg by mouth daily with supper.   multivitamin with minerals  Tabs tablet Take 1 tablet by mouth daily.   nitroGLYCERIN 0.4 MG SL tablet Commonly known as:  NITROSTAT PLACE 1 TABLET UNDER THE TONGUE EVERY 5 MINUTES AS NEEDED FOR CHEST PAIN. CALL 911 AT THIRD DOSE WITHIN 15 MINUTES What changed:  See the new instructions.   omeprazole 40 MG capsule Commonly known as:  PRILOSEC Take 40 mg by mouth daily.   Oxycodone HCl 10 MG Tabs Take 1 tablet (10 mg total) by mouth every 4 (four) hours as needed (pain). scheduled   polyethylene glycol powder powder Commonly known as:  GLYCOLAX/MIRALAX Take 17 g by mouth daily as needed (constipation). Mix in 8 oz water, juice, soda, coffee or tea and drink   potassium chloride SA 20 MEQ tablet Commonly known as:  K-DUR,KLOR-CON Take 1 tablet (20 mEq total) by mouth daily.   pregabalin 100 MG capsule Commonly known as:  LYRICA Take 100-200 mg by mouth 2 (two) times daily. Take 100 mg in the morning and 200 mg at night   traMADol 50 MG tablet Commonly known as:  ULTRAM Take 50 mg by mouth 3 (three) times daily. scheduled   triamcinolone ointment 0.5 % Commonly known as:  KENALOG Apply 1 application topically 2 (two) times daily.        Outstanding Labs/Studies   None  Duration of Discharge Encounter   Greater than 30 minutes including physician time.  SignedKathyrn Drown NP 02/19/2017, 3:46 PM

## 2017-02-19 NOTE — Progress Notes (Signed)
Rennis Harding R.N. From cath lab order for patient to have juice and crackers now.

## 2017-02-19 NOTE — Progress Notes (Signed)
   Cardiac catheter does not reveal any significant issues.  Assuming recovery from procedure without difficulty, likely discharge later today.

## 2017-03-02 ENCOUNTER — Encounter (HOSPITAL_COMMUNITY): Payer: Self-pay | Admitting: Emergency Medicine

## 2017-03-02 DIAGNOSIS — M79669 Pain in unspecified lower leg: Secondary | ICD-10-CM | POA: Insufficient documentation

## 2017-03-02 DIAGNOSIS — Z5321 Procedure and treatment not carried out due to patient leaving prior to being seen by health care provider: Secondary | ICD-10-CM | POA: Diagnosis not present

## 2017-03-02 NOTE — ED Triage Notes (Signed)
Patient arrived with EMS from home - brief syncopal episode this evening /fell from couch with chronic bilateral lower legs / back pain . Alert and oriented at arrival , respirations unlabored .

## 2017-03-03 ENCOUNTER — Emergency Department (HOSPITAL_COMMUNITY)
Admission: EM | Admit: 2017-03-03 | Discharge: 2017-03-03 | Disposition: A | Discharge status: Home / Self Care | Payer: Medicare Other | Attending: Emergency Medicine | Admitting: Emergency Medicine

## 2017-03-03 LAB — BASIC METABOLIC PANEL
Anion gap: 8 (ref 5–15)
BUN: 12 mg/dL (ref 6–20)
CO2: 31 mmol/L (ref 22–32)
Calcium: 9.1 mg/dL (ref 8.9–10.3)
Chloride: 99 mmol/L — ABNORMAL LOW (ref 101–111)
Creatinine, Ser: 1.16 mg/dL (ref 0.61–1.24)
GFR calc Af Amer: >60 mL/min (ref >60)
GFR calc non Af Amer: >60 mL/min (ref >60)
Glucose, Bld: 110 mg/dL — ABNORMAL HIGH (ref 65–99)
Potassium: 3.4 mmol/L — ABNORMAL LOW (ref 3.5–5.1)
Sodium: 138 mmol/L (ref 135–145)

## 2017-03-03 LAB — CBC
HCT: 40.3 % (ref 39.0–52.0)
Hemoglobin: 12.8 g/dL — ABNORMAL LOW (ref 13.0–17.0)
MCH: 25.4 pg — ABNORMAL LOW (ref 26.0–34.0)
MCHC: 31.8 g/dL (ref 30.0–36.0)
MCV: 80 fL (ref 78.0–100.0)
Platelets: 220 10*3/uL (ref 150–400)
RBC: 5.04 MIL/uL (ref 4.22–5.81)
RDW: 17.4 % — ABNORMAL HIGH (ref 11.5–15.5)
WBC: 6.9 10*3/uL (ref 4.0–10.5)

## 2017-03-03 LAB — URINALYSIS, ROUTINE W REFLEX MICROSCOPIC
Bilirubin Urine: NEGATIVE
Glucose, UA: NEGATIVE mg/dL
Hgb urine dipstick: NEGATIVE
Ketones, ur: NEGATIVE mg/dL
Leukocytes, UA: NEGATIVE
Nitrite: NEGATIVE
Protein, ur: NEGATIVE mg/dL
Specific Gravity, Urine: 1.011 (ref 1.005–1.030)
pH: 7 (ref 5.0–8.0)

## 2017-03-03 NOTE — ED Notes (Signed)
This pt arrived to triage via EMS for leg pain and "falling out". Approximately 23:55 03/02/17 this tech entered triage rm 2 to perform a CBG and an EKG.  Someone attempted to bring the pt's wife to the room and I asked them to return to the waiting room because I would be bringing him out there after these tests were completed.  At this point the pt realized that he was going to have to sit in waiting room and became angry.  The pt stated that he was not going to wait in the waiting room and would leave if that was the plan and asked to see his wife.  I informed him that we wanted to take care of him but if he wanted to leave then he could walk to the waiting room and see his wife.  He asked me for some clothes.  I stated that he was wearing shorts and a t-shirt and asked him why he needed more clothes.  He told me I was being smart and asked me for some shorts.  I again stated that he had shorts on, that I did not have shorts to give him and that I did not understand why he needed me to get him more clothes.  I offered him socks.  When I brought the socks to him, he cursed at me and slammed the door in my face.    At this point I invited security Shanon Brow) to the room.  He said he did not want to speak to me and asked security for shorts.  I again informed him that we did not have shorts.  The pt stated again that he wanted to see his wife so security and I walked out to the lobby and explained the situation.  She was not surprised at this behavior and after some discussion agreed to come try to reason with him.  Upon returning to the room, she tried to redirect his behavior and explain that she understood her decision to have the pt and his wife wait in the lobby but the pt stated that he was not waiting in the lobby and he wanted to leave in a cab.    Security wheeled his wife out because of problems with her feet and the pt followed.  When leaving the room the pt stated that this tech was lucky he did not beat  this tech's a** and balled up his fist as he walked out into the lobby.

## 2017-03-09 NOTE — Progress Notes (Signed)
Cardiology Office Note   Date:  03/10/2017   ID:  Thomas Mcclure, DOB [birth year], MRN [id number]  PCP:  [name], MD  Cardiologist:  [name]     Chief Complaint  Patient presents with  . Hospitalization Follow-up      History of Present Illness: Thomas Mcclure is a 62 y.o. male who presents for post hospitalization for chest pain.  Was admitted from the office.  He has a hx of known CAD with prior PCI to RCA and residual disease.  Pt felt the same way with prior MI.  He was admitted and cardiac cath planned.  On cath had patent stent in RCA and non obstructive disease 02/19/17.  Troponin neg. He was discharged on the 27th.  Today he has no pain no SOB, but a week ago on Monday he passed out - stood up and went out.  No Injuries,  He notes when he first gets up in the AM he must sit on side of bed for a few before standing due to dizziness.  He talks about crying easily but does not feel depressed.  Has not discussed with [name].  Does not want meds.     No orthostatic hypotension today but dizzy when he stands up.  He tells me he has had syncope with low K+ before.  He was hypokalemic in the hospital.      Past Medical History:  Diagnosis Date  . Baker's cyst    Left calf  . CAD in native artery, 07/15/15 PCI of RCA with DES 07/16/2015   a.   NSTEMI 5/17: LHC - pLAD 20, pLCx 20, OM1 30, pRCA 100, EF 45-50%>> PCI: 2.5 x 24 mm Promus DES to RCA  //  b.   Echo 5/17: mild LVH, EF 50-55%, no RWMA, mod RVE //  c. LHC 6/17: pLAD 20, pLCx 20, OM1 50, pRCA stent ok, EF 35-45% with mod sized inf wall and basal segment aneurysm  . Chronic back pain    "down my back, down my legs" (02/18/2017)  . GERD (gastroesophageal reflux disease)   . Hyperlipidemia   . Hypertension    Dr. [name] [phone]  . Ischemic cardiomyopathy    a. LV-gram at time of LHC in 6/17 with EF 35-45%  //  b. Echo 7/17: EF 45-50%, inferior HK, grade 1 diastolic dysfunction, mildly dilated aortic root,  moderately reduced RVSF, mild RAE  . Myocardial infarction ([place]) 2017  . 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 Numbness and tingling of both lower extremities    "on the outside of both sides" (02/18/2017)  . Rheumatoid arthritis ([place])   . Type II diabetes mellitus ([place])    diet controlled    Past Surgical History:  Procedure Laterality Date  . BACK SURGERY    . CARDIAC CATHETERIZATION N/A 07/15/2015   Procedure: Left Heart Cath and Coronary Angiography;  Surgeon: [name], MD;  Location: [facility];  Service: Cardiovascular;  Laterality: N/A;  . CARDIAC CATHETERIZATION N/A 07/15/2015   Procedure: Coronary Stent Intervention;  Surgeon: [name], MD;  Location: [facility];  Service: Cardiovascular;  Laterality: N/A;  . CARDIAC CATHETERIZATION N/A 08/07/2015   Procedure: Left Heart Cath and Coronary Angiography;  Surgeon: [name], MD;  Location: [facility];  Service: Cardiovascular;  Laterality: N/A;  . CORONARY ANGIOPLASTY WITH STENT PLACEMENT  07/2015  . JOINT REPLACEMENT    .  LEFT HEART CATH AND CORONARY ANGIOGRAPHY N/A 02/19/2017   Procedure: LEFT HEART CATH AND CORONARY ANGIOGRAPHY;  Surgeon: Martinique, Peter M, MD;  Location: [facility];  Service: Cardiovascular;  Laterality: N/A;  . LUMBAR SPINE SURGERY  03/2009  & 2012  . MASS EXCISION N/A 04/19/2012   Procedure: removal of posterior cervical lipoma;  Surgeon: [name], MD;  Location: [facility];  Service: Neurosurgery;  Laterality: N/A;  Removal of posterior cervical lipoma  . POPLITEAL SYNOVIAL CYST EXCISION Left    "opened up behind my knee; scraped out arthritis"  . SPINAL CORD STIMULATOR INSERTION N/A 01/07/2013   Procedure:  SPINAL CORD STIMULATOR INSERTION;  Surgeon: [name], MD;  Location: [facility];  Service: Neurosurgery;  Laterality: N/A;  . TONSILLECTOMY    . TOTAL HIP ARTHROPLASTY Right 10/03/2016   Procedure: TOTAL HIP ARTHROPLASTY;   Surgeon: [name], MD;  Location: [facility];  Service: Orthopedics;  Laterality: Right;  . WISDOM TOOTH EXTRACTION     hx of     Current Outpatient Medications  Medication Sig Dispense Refill  . aspirin EC 81 MG tablet Take 1 tablet (81 mg total) by mouth daily. 30 tablet 1  . atorvastatin (LIPITOR) 80 MG tablet TAKE 1 TABLET BY MOUTH EVERY DAY AT 6PM (Patient taking differently: TAKE 1 TABLET (80mg ) BY MOUTH EVERY DAY AT 6PM) 30 tablet 8  . chlorthalidone (HYGROTON) 25 MG tablet Take 1 tablet (25 mg total) by mouth daily. 90 tablet 2  . cyclobenzaprine (FLEXERIL) 10 MG tablet Take 10 mg by mouth daily as needed for muscle spasms.    . diazepam (VALIUM) 10 MG tablet Take 10 mg by mouth every evening. May take an additional 10 mg daily as needed for muscle spasms    . DILT-XR 180 MG 24 hr capsule Take 180 mg by mouth once daily  4  . DULoxetine (CYMBALTA) 60 MG capsule Take 60 mg by mouth daily.    Marland Kitchen lisinopril (PRINIVIL,ZESTRIL) 20 MG tablet Take 20 mg by mouth every evening.   3  . metFORMIN (GLUCOPHAGE-XR) 750 MG 24 hr tablet Take 750 mg by mouth daily with supper.     . Multiple Vitamin (MULTIVITAMIN WITH MINERALS) TABS tablet Take 1 tablet by mouth daily.    . nitroGLYCERIN (NITROSTAT) 0.4 MG SL tablet PLACE 1 TABLET UNDER THE TONGUE EVERY 5 MINUTES AS NEEDED FOR CHEST PAIN. CALL 911 AT THIRD DOSE WITHIN 15 MINUTES (Patient taking differently: PLACE 1 TABLET (0.4mg ) UNDER THE TONGUE EVERY 5 MINUTES AS NEEDED FOR CHEST PAIN. CALL 911 AT THIRD DOSE WITHIN 15 MINUTES) 300 tablet 3  . omeprazole (PRILOSEC) 40 MG capsule Take 40 mg by mouth daily.    . Oxycodone HCl 10 MG TABS Take 1 tablet (10 mg total) by mouth every 4 (four) hours as needed (pain). scheduled 80 tablet 0  . polyethylene glycol powder (GLYCOLAX/MIRALAX) powder Take 17 g by mouth daily as needed (constipation). Mix in 8 oz water, juice, soda, coffee or tea and drink    . potassium chloride SA (K-DUR,KLOR-CON) 20 MEQ tablet  Take 1 tablet (20 mEq total) by mouth daily. 90 tablet 3  . pregabalin (LYRICA) 100 MG capsule Take 100-200 mg by mouth 2 (two) times daily. Take 100 mg in the morning and 200 mg at night    . traMADol (ULTRAM) 50 MG tablet Take 50 mg by mouth 3 (three) times daily. scheduled    . triamcinolone ointment (KENALOG) 0.5 %  Apply 1 application topically 2 (two) times daily. 30 g 0   No current facility-administered medications for this visit.     Allergies:   Brilinta [ticagrelor]; Methylprednisolone; Prednisone; Toradol [ketorolac tromethamine]; and Adhesive [tape]    Social History:  The patient  reports that he has quit smoking. His smoking use included cigarettes. He has a 10.00 pack-year smoking history. he has never used smokeless tobacco. He reports that he does not drink alcohol or use drugs.   Family History:  The patient's family history includes Heart disease in his mother; Prostate cancer in his brother.    ROS:  General:no colds or fevers, + weight changes Skin:no rashes or ulcers HEENT:no blurred vision, no congestion CV:see HPI PUL:see HPI GI:no diarrhea constipation or melena, no indigestion GU:no hematuria, no dysuria MS:no joint pain, no claudication Neuro:no syncope, no lightheadedness Endo:+ diabetes, no thyroid disease   Wt Readings from Last 3 Encounters:  03/10/17 261 lb 6.4 oz (118.6 kg)  02/19/17 245 lb (111.1 kg)  02/18/17 252 lb 1.9 oz (114.4 kg)     PHYSICAL EXAM: VS:  BP 138/82 (BP Location: Left Arm, Patient Position: Sitting, Cuff Size: Normal)   Pulse 77   Ht 6' (1.829 m)   Wt 261 lb 6.4 oz (118.6 kg)   SpO2 97%   BMI 35.45 kg/m  , BMI Body mass index is 35.45 kg/m. General:Pleasant affect, NAD Skin:Warm and dry, brisk capillary refill HEENT:normocephalic, sclera clear, mucus membranes moist Neck:supple, no JVD, no bruits  Heart:S1S2 RRR without murmur, gallup, rub or click Lungs:clear without rales, rhonchi, or wheezes QIH:KVQQ, non tender,  + BS, do not palpate liver spleen or masses Ext:no lower ext edema, 2+ pedal pulses, 2+ radial pulses Neuro:alert and oriented, MAE, follows commands, + facial symmetry  BP lying 131/89, sitting 144/106 , standing 137/92, and standing 144/95  EKG:  EKG is ordered today. The ekg ordered today demonstrates SR with old Q waves in inf leads no acute changes.     Recent Labs: 02/18/2017: ALT 26; B Natriuretic Peptide 14.6; TSH 0.589 03/02/2017: BUN 12; Creatinine, Ser 1.16; Hemoglobin 12.8; Platelets 220; Potassium 3.4; Sodium 138    Lipid Panel    Component Value Date/Time   CHOL 107 02/19/2017 0222   CHOL 118 05/13/2016 0831   TRIG 131 02/19/2017 0222   HDL 25 (L) 02/19/2017 0222   HDL 32 (L) 05/13/2016 0831   CHOLHDL 4.3 02/19/2017 0222   VLDL 26 02/19/2017 0222   LDLCALC 56 02/19/2017 0222   [place] 65 05/13/2016 0831       Other studies Reviewed: Additional studies/ records that were reviewed today include: . Cardiac cath 02/19/17 Conclusion     Prox LAD to Mid LAD lesion is 20% stenosed.  Prox Cx to Mid Cx lesion is 20% stenosed.  Ost 1st Mrg to 1st Mrg lesion is 50% stenosed.  Non-stenotic Prox RCA lesion previously treated.  The left ventricular systolic function is normal.  LV end diastolic pressure is normal.  The left ventricular ejection fraction is 50-55% by visual estimate.   1. Nonobstructive CAD. Patent stent in the RCA.  2. Overall good LV function 3. Normal LVEDP  Plan: continue medical therapy      ASSESSMENT AND PLAN:  1.  Syncope not hypotensive, BP actually increases.  - will check event monitor.  Will check BMP and Mg + level as well. Follow up with [name] in March.  2.  Chest pain on last visit with admit and  cath, non obstructive disease and patent stents.    3.  CAD with hx of stent. Recent non obstructive CAD on cath. Done for chest pain, no further chest pin. Patent previous stent.   4.  HLD on lipitor continue  5.  DM  per PCP   6.  HTN essential stable.    Current medicines are reviewed with the patient today.  The patient Has no concerns regarding medicines.  The following changes have been made:  See above Labs/ tests ordered today include:see above  Disposition:   FU:  see above  Signed, Cecilie Kicks, NP  03/10/2017 8:50 AM    [facility], [place], [place], [place] Phone: [phone]; Fax: [phone]

## 2017-03-10 ENCOUNTER — Encounter (INDEPENDENT_AMBULATORY_CARE_PROVIDER_SITE_OTHER): Payer: Self-pay

## 2017-03-10 ENCOUNTER — Encounter: Payer: Self-pay | Admitting: Cardiology

## 2017-03-10 ENCOUNTER — Ambulatory Visit (INDEPENDENT_AMBULATORY_CARE_PROVIDER_SITE_OTHER): Payer: Medicare Other | Admitting: Cardiology

## 2017-03-10 VITALS — BP 138/82 | HR 77 | Ht 72.0 in | Wt 261.4 lb

## 2017-03-10 DIAGNOSIS — R55 Syncope and collapse: Secondary | ICD-10-CM | POA: Diagnosis not present

## 2017-03-10 DIAGNOSIS — I10 Essential (primary) hypertension: Secondary | ICD-10-CM | POA: Diagnosis not present

## 2017-03-10 DIAGNOSIS — E118 Type 2 diabetes mellitus with unspecified complications: Secondary | ICD-10-CM | POA: Diagnosis not present

## 2017-03-10 DIAGNOSIS — E876 Hypokalemia: Secondary | ICD-10-CM

## 2017-03-10 DIAGNOSIS — E782 Mixed hyperlipidemia: Secondary | ICD-10-CM | POA: Diagnosis not present

## 2017-03-10 DIAGNOSIS — I2510 Atherosclerotic heart disease of native coronary artery without angina pectoris: Secondary | ICD-10-CM

## 2017-03-10 NOTE — Patient Instructions (Addendum)
Medication Instructions:  Your physician recommends that you continue on your current medications as directed. Please refer to the Current Medication list given to you today.   Labwork: TODAY:  BMET & MAGNESIUM  Testing/Procedures: Your physician has recommended that you wear an event monitor. Event monitors are medical devices that record the heart's electrical activity. Doctors most often [place] these monitors to diagnose arrhythmias. Arrhythmias are problems with the speed or rhythm of the heartbeat. The monitor is a small, portable device. You can wear one while you do your normal daily activities. This is usually used to diagnose what is causing palpitations/syncope (passing out).   Follow-Up: Your physician recommends that you schedule a follow-up appointment in: 2-3 [place] GERHARDT, NP   Any Other Special Instructions Will Be Listed Below (If Applicable).  Cardiac Event Monitoring A cardiac event monitor is a small recording device that is used to detect abnormal heart rhythms (arrhythmias). The monitor is used to record your heart rhythm when you have symptoms, such as:  Fast heartbeats (palpitations), such as heart racing or fluttering.  Dizziness.  Fainting or light-headedness.  Unexplained weakness.  Some monitors are wired to electrodes placed on your chest. Electrodes are flat, sticky disks that attach to your skin. Other monitors may be hand-held or worn on the wrist. The monitor can be worn for up to 30 days. If the monitor is attached to your chest, a technician will prepare your chest for the electrode placement and show you how to work the monitor. Take time to practice using the monitor before you leave the office. Make sure you understand how to send the information from the monitor to your health care provider. In some cases, you may need to use a landline telephone instead of a cell phone. What are the risks? Generally, this device is safe to  use, but it possible that the skin under the electrodes will become irritated. How to use your cardiac event monitor  Wear your monitor at all times, except when you are in water: ? Do not let the monitor get wet. ? Take the monitor off when you bathe. Do not swim or use a hot tub with it on.  Keep your skin clean. Do not put body lotion or moisturizer on your chest.  Change the electrodes as told by your health care provider or any time they stop sticking to your skin. You may need to use medical tape to keep them on.  Try to put the electrodes in slightly different places on your chest to help prevent skin irritation. They must remain in the area under your left breast and in the upper right section of your chest.  Make sure the monitor is safely clipped to your clothing or in a location close to your body that your health care provider recommends.  Press the button to record as soon as you feel heart-related symptoms, such as: ? Dizziness. ? Weakness. ? Light-headedness. ? Palpitations. ? Thumping or pounding in your chest. ? Shortness of breath. ? Unexplained weakness.  Keep a diary of your activities, such as walking, doing chores, and taking medicine. It is very important to note what you were doing when you pushed the button to record your symptoms. This will help your health care provider determine what might be contributing to your symptoms.  Send the recorded information as recommended by your health care provider. It may take some time for your health care provider to process the results.  Change the batteries as told by your health care provider.  Keep electronic devices away from your monitor. This includes: ? Tablets. ? MP3 players. ? Cell phones.  While wearing your monitor you should avoid: ? Electric blankets. ? Armed forces operational officer. ? Electric toothbrushes. ? Microwave ovens. ? Magnets. ? Metal detectors. Get help right away if:  You have chest pain.  You  have extreme difficulty breathing or shortness of breath.  You develop a very fast heartbeat that persists.  You develop dizziness that does not go away.  You faint or constantly feel like you are about to faint. Summary  A cardiac event monitor is a small recording device that is used to help detect abnormal heart rhythms (arrhythmias).  The monitor is used to record your heart rhythm when you have heart-related symptoms.  Make sure you understand how to send the information from the monitor to your health care provider.  It is important to press the button on the monitor when you have any heart-related symptoms.  Keep a diary of your activities, such as walking, doing chores, and taking medicine. It is very important to note what you were doing when you pushed the button to record your symptoms. This will help your health care provider learn what might be causing your symptoms. This information is not intended to replace advice given to you by your health care provider. Make sure you discuss any questions you have with your health care provider. Document Released: 11/20/2007 Document Revised: 01/26/2016 Document Reviewed: 01/26/2016 Elsevier Interactive Patient Education  2017 [employer].    If you need a refill on your cardiac medications before your next appointment, please call your pharmacy.

## 2017-03-11 LAB — MAGNESIUM: Magnesium: 1.7 mg/dL (ref 1.6–2.3)

## 2017-03-11 LAB — BASIC METABOLIC PANEL
BUN/Creatinine Ratio: 15 (ref 10–24)
BUN: 16 mg/dL (ref 8–27)
CO2: 25 mmol/L (ref 20–29)
Calcium: 9.7 mg/dL (ref 8.6–10.2)
Chloride: 98 mmol/L (ref 96–106)
Creatinine, Ser: 1.06 mg/dL (ref 0.76–1.27)
GFR calc Af Amer: 87 mL/min/{1.73_m2} (ref >59)
GFR calc non Af Amer: 75 mL/min/{1.73_m2} (ref >59)
Glucose: 146 mg/dL — ABNORMAL HIGH (ref 65–99)
Potassium: 4.5 mmol/L (ref 3.5–5.2)
Sodium: 141 mmol/L (ref 134–144)

## 2017-03-17 ENCOUNTER — Telehealth: Payer: Self-pay | Admitting: *Deleted

## 2017-03-17 ENCOUNTER — Ambulatory Visit (INDEPENDENT_AMBULATORY_CARE_PROVIDER_SITE_OTHER): Payer: Medicare Other

## 2017-03-17 DIAGNOSIS — R55 Syncope and collapse: Secondary | ICD-10-CM

## 2017-03-17 NOTE — Telephone Encounter (Signed)
Patient requires a second back surgery.  He will need cardiac clearance.  He has been told this will be a 5 hour surgery.  Please call with instructions on what he needs to do.

## 2017-03-19 NOTE — Telephone Encounter (Signed)
Called and left message for pt to have surgeon's office send over a clearance form so we know exactly what surgery is being performed and have anesthesia information.  Provided fax number on message and advised to call back if any questions.

## 2017-03-30 ENCOUNTER — Other Ambulatory Visit: Payer: Self-pay | Admitting: Neurosurgery

## 2017-03-30 DIAGNOSIS — M545 Low back pain: Principal | ICD-10-CM

## 2017-03-30 DIAGNOSIS — G8929 Other chronic pain: Secondary | ICD-10-CM

## 2017-04-13 ENCOUNTER — Ambulatory Visit
Admission: RE | Admit: 2017-04-13 | Discharge: 2017-04-13 | Disposition: A | Discharge status: Home / Self Care | Payer: Medicare Other | Source: Ambulatory Visit | Attending: Neurosurgery | Admitting: Neurosurgery

## 2017-04-13 VITALS — BP 119/77 | HR 82

## 2017-04-13 DIAGNOSIS — M5416 Radiculopathy, lumbar region: Principal | ICD-10-CM

## 2017-04-13 DIAGNOSIS — G8929 Other chronic pain: Secondary | ICD-10-CM

## 2017-04-13 DIAGNOSIS — M545 Low back pain: Secondary | ICD-10-CM

## 2017-04-13 MED ORDER — DIAZEPAM 5 MG PO TABS
10.0000 mg | ORAL_TABLET | Freq: Once | ORAL | Status: AC
  Administered 2017-04-13: 10 mg via ORAL

## 2017-04-13 MED ORDER — ONDANSETRON HCL 4 MG/2ML IJ SOLN
4.0000 mg | Freq: Once | INTRAMUSCULAR | Status: AC
  Administered 2017-04-13: 4 mg via INTRAMUSCULAR

## 2017-04-13 MED ORDER — MEPERIDINE HCL 100 MG/ML IJ SOLN
100.0000 mg | Freq: Once | INTRAMUSCULAR | Status: AC
  Administered 2017-04-13: 100 mg via INTRAMUSCULAR

## 2017-04-13 MED ORDER — IOPAMIDOL (ISOVUE-M 200) INJECTION 41%
15.0000 mL | Freq: Once | INTRAMUSCULAR | Status: AC
  Administered 2017-04-13: 15 mL via INTRATHECAL

## 2017-04-13 NOTE — Discharge Instructions (Signed)
Myelogram Discharge Instructions  1. Go home and rest quietly for the next 24 hours.  It is important to lie flat for the next 24 hours.  Get up only to go to the restroom.  You may lie in the bed or on a couch on your back, your stomach, your left side or your right side.  You may have one pillow under your head.  You may have pillows between your knees while you are on your side or under your knees while you are on your back.  2. DO NOT drive today.  Recline the seat as far back as it will go, while still wearing your seat belt, on the way home.  3. You may get up to go to the bathroom as needed.  You may sit up for 10 minutes to eat.  You may resume your normal diet and medications unless otherwise indicated.  Drink lots of extra fluids today and tomorrow.  4. The incidence of headache, nausea, or vomiting is about 5% (one in 20 patients).  If you develop a headache, lie flat and drink plenty of fluids until the headache goes away.  Caffeinated beverages may be helpful.  If you develop severe nausea and vomiting or a headache that does not go away with flat bed rest, call [phone].  5. You may resume normal activities after your 24 hours of bed rest is over; however, do not exert yourself strongly or do any heavy lifting tomorrow. If when you get up you have a headache when standing, go back to bed and force fluids for another 24 hours.  6. Call your physician for a follow-up appointment.  The results of your myelogram will be sent directly to your physician by the following day.  7. If you have any questions or if complications develop after you arrive home, please call [phone].  Discharge instructions have been explained to the patient.  The patient, or the person responsible for the patient, fully understands these instructions.  YOU MAY RESTART YOUR TRAMADOL AND CYMBALTA TOMORROW 04/14/2017 AT 1:00 PM.

## 2017-05-06 ENCOUNTER — Other Ambulatory Visit: Payer: Self-pay | Admitting: Neurosurgery

## 2017-05-06 ENCOUNTER — Telehealth: Payer: Self-pay

## 2017-05-06 NOTE — Telephone Encounter (Signed)
   [facility] Pre-operative Risk Assessment    Request for surgical clearance:  1. What type of surgery is being performed? Posterior Lumbar Fusion   2. When is this surgery scheduled? TBD   3. What type of clearance is required (medical clearance vs. Pharmacy clearance to hold med vs. Both)? Both  4. Are there any medications that need to be held prior to surgery and how long? Blood Thinners   5. Practice name and name of physician performing surgery? [facility] - Dr. Newman Pies  6. What is your office phone and fax number? Phone: [phone] Fax: [phone], ATTN: Nikki  7. Anesthesia type (None, local, MAC, general) ? None listed    Thomas Mcclure 05/06/2017, 1:49 PM  _________________________________________________________________   (provider comments below)

## 2017-05-11 NOTE — Telephone Encounter (Signed)
Left message for the patient to call the on-call APP for final clearance. Recently seen by Cecilie Kicks NP.   [name], is the patient ok to come off ASA for 5-7 days prior to lumbar fusion surgery. Last cath 06/2015 with DES to prox RCA. Most recently underwent cath on 02/19/2017, patent stent, medical therapy.   [name], please send your response to [facility] Preop   Thank you  Signed, Almyra Deforest PA Pager: [phone]

## 2017-05-12 NOTE — Telephone Encounter (Signed)
   Primary Cardiologist: Sinclair Grooms, MD  Chart reviewed as part of pre-operative protocol coverage. Given past medical history and time since last visit, based on ACC/AHA guidelines, Thomas Mcclure would be at acceptable risk for the planned procedure without further cardiovascular testing.   [name] was contacted by phone and advised that since his last stent was 2017, he is at acceptable risk to come off the aspirin prior to the procedure.  However, restart it as soon as possible.  I will route this recommendation to the requesting party via Epic fax function and remove from pre-op pool.  Please call with questions.  Rosaria Ferries, PA-C 05/12/2017, 2:23 PM

## 2017-05-12 NOTE — Progress Notes (Signed)
Cardiology Office Note    Date:  05/13/2017   ID:  Thomas Mcclure, Thomas Mcclure [birth year], MRN [id number]  PCP:  [name], MD  Cardiologist: Sinclair Grooms, MD   Chief Complaint  Patient presents with  . Coronary Artery Disease    History of Present Illness:  Thomas Mcclure is a 62 y.o. male  history of NSTEMI in 06-2015 status post DES to the RCA complicated by LV aneurysm and chronic systolic heart failure (EF 55-60 percent by echo on 12/31/15, but prior to this was 45-50 percent), diabetes, hypertension, hyperlipidemia, and chronic back pain status post spinal stimulator   Thomas Mcclure is doing well.  He is status post right hip arthroplasty that went well.  No cardiovascular complications during the procedure.  Previously had lumbar disc surgery but is had persistent pain and now needs a reoperation by Dr. Newman Pies.  The operation is planned to be performed within the next 2 weeks.  Cardiac status is stable.  He specifically denies dyspnea, chest pain, orthopnea, PND, and edema.  Back is preventing physical activity which is a positive predictor of future cardiac events.  Therefore, from a cardiovascular health it is hopeful repeat surgery will improve Thomas Mcclure ability to ambulate and have an opportunity for aerobic activity.  No nitroglycerin use since last seen.   Past Medical History:  Diagnosis Date  . Baker's cyst    Left calf  . CAD in native artery, 07/15/15 PCI of RCA with DES 07/16/2015   a.   NSTEMI 5/17: LHC - pLAD 20, pLCx 20, OM1 30, pRCA 100, EF 45-50%>> PCI: 2.5 x 24 mm Promus DES to RCA  //  b.   Echo 5/17: mild LVH, EF 50-55%, no RWMA, mod RVE //  c. LHC 6/17: pLAD 20, pLCx 20, OM1 50, pRCA stent ok, EF 35-45% with mod sized inf wall and basal segment aneurysm  . Chronic back pain    "down my back, down my legs" (02/18/2017)  . GERD (gastroesophageal reflux disease)   . Hyperlipidemia   . Hypertension    Dr. [name] [phone]  . Ischemic cardiomyopathy    a.  LV-gram at time of LHC in 6/17 with EF 35-45%  //  b. Echo 7/17: EF 45-50%, inferior HK, grade 1 diastolic dysfunction, mildly dilated aortic root, moderately reduced RVSF, mild RAE  . Myocardial infarction ([place]) 2017  . 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 Numbness and tingling of both lower extremities    "on the outside of both sides" (02/18/2017)  . Rheumatoid arthritis ([place])   . Type II diabetes mellitus ([place])    diet controlled    Past Surgical History:  Procedure Laterality Date  . BACK SURGERY    . CARDIAC CATHETERIZATION N/A 07/15/2015   Procedure: Left Heart Cath and Coronary Angiography;  Surgeon: [name], MD;  Location: [facility];  Service: Cardiovascular;  Laterality: N/A;  . CARDIAC CATHETERIZATION N/A 07/15/2015   Procedure: Coronary Stent Intervention;  Surgeon: [name], MD;  Location: [place] [facility];  Service: Cardiovascular;  Laterality: N/A;  . CARDIAC CATHETERIZATION N/A 08/07/2015   Procedure: Left Heart Cath and Coronary Angiography;  Surgeon: [name], MD;  Location: [facility];  Service: Cardiovascular;  Laterality: N/A;  . CORONARY ANGIOPLASTY WITH STENT PLACEMENT  07/2015  . JOINT REPLACEMENT    . LEFT HEART CATH AND CORONARY ANGIOGRAPHY N/A 02/19/2017   Procedure:  LEFT HEART CATH AND CORONARY ANGIOGRAPHY;  Surgeon: Martinique, Peter M, MD;  Location: [facility];  Service: Cardiovascular;  Laterality: N/A;  . LUMBAR SPINE SURGERY  03/2009  & 2012  . MASS EXCISION N/A 04/19/2012   Procedure: removal of posterior cervical lipoma;  Surgeon: [name], MD;  Location: [facility];  Service: Neurosurgery;  Laterality: N/A;  Removal of posterior cervical lipoma  . POPLITEAL SYNOVIAL CYST EXCISION Left    "opened up behind my knee; scraped out arthritis"  . SPINAL CORD STIMULATOR INSERTION N/A 01/07/2013   Procedure:  SPINAL CORD STIMULATOR INSERTION;  Surgeon: [name], MD;  Location: [facility];   Service: Neurosurgery;  Laterality: N/A;  . TONSILLECTOMY    . TOTAL HIP ARTHROPLASTY Right 10/03/2016   Procedure: TOTAL HIP ARTHROPLASTY;  Surgeon: [name], MD;  Location: [facility];  Service: Orthopedics;  Laterality: Right;  . WISDOM TOOTH EXTRACTION     hx of    Current Medications: Outpatient Medications Prior to Visit  Medication Sig Dispense Refill  . aspirin EC 81 MG tablet Take 1 tablet (81 mg total) by mouth daily. 30 tablet 1  . atorvastatin (LIPITOR) 80 MG tablet TAKE 1 TABLET BY MOUTH EVERY DAY AT 6PM (Patient taking differently: TAKE 1 TABLET (80mg ) BY MOUTH EVERY DAY AT 6PM) 30 tablet 8  . chlorthalidone (HYGROTON) 25 MG tablet Take 1 tablet (25 mg total) by mouth daily. 90 tablet 2  . cyclobenzaprine (FLEXERIL) 10 MG tablet Take 10 mg by mouth daily as needed for muscle spasms.    . diazepam (VALIUM) 10 MG tablet Take 10 mg by mouth every evening. May take an additional 10 mg daily as needed for muscle spasms    . DILT-XR 180 MG 24 hr capsule Take 180 mg by mouth once daily  4  . DULoxetine (CYMBALTA) 60 MG capsule Take 60 mg by mouth daily.    Marland Kitchen lisinopril (PRINIVIL,ZESTRIL) 20 MG tablet Take 20 mg by mouth every evening.   3  . metFORMIN (GLUCOPHAGE-XR) 750 MG 24 hr tablet Take 750 mg by mouth daily with supper.     . Multiple Vitamin (MULTIVITAMIN WITH MINERALS) TABS tablet Take 1 tablet by mouth daily.    . nitroGLYCERIN (NITROSTAT) 0.4 MG SL tablet PLACE 1 TABLET UNDER THE TONGUE EVERY 5 MINUTES AS NEEDED FOR CHEST PAIN. CALL 911 AT THIRD DOSE WITHIN 15 MINUTES (Patient taking differently: PLACE 1 TABLET (0.4mg ) UNDER THE TONGUE EVERY 5 MINUTES AS NEEDED FOR CHEST PAIN. CALL 911 AT THIRD DOSE WITHIN 15 MINUTES) 300 tablet 3  . omeprazole (PRILOSEC) 40 MG capsule Take 40 mg by mouth daily.    . Oxycodone HCl 10 MG TABS Take 1 tablet (10 mg total) by mouth every 4 (four) hours as needed (pain). scheduled 80 tablet 0  . polyethylene glycol powder (GLYCOLAX/MIRALAX)  powder Take 17 g by mouth daily as needed (constipation). Mix in 8 oz water, juice, soda, coffee or tea and drink    . potassium chloride SA (K-DUR,KLOR-CON) 20 MEQ tablet Take 1 tablet (20 mEq total) by mouth daily. 90 tablet 3  . pregabalin (LYRICA) 100 MG capsule Take 100-200 mg by mouth 2 (two) times daily. Take 100 mg in the morning and 200 mg at night    . traMADol (ULTRAM) 50 MG tablet Take 50 mg by mouth 3 (three) times daily. scheduled    . triamcinolone ointment (KENALOG) 0.5 % Apply 1 application topically 2 (two) times  daily. 30 g 0   No facility-administered medications prior to visit.      Allergies:   Brilinta [ticagrelor]; Methylprednisolone; Prednisone; Toradol [ketorolac tromethamine]; and Adhesive [tape]   Social History   Socioeconomic History  . Marital status: Married    Spouse name: None  . Number of children: 5  . Years of education: None  . Highest education level: None  Social Needs  . Financial resource strain: None  . Food insecurity - worry: None  . Food insecurity - inability: None  . Transportation needs - medical: None  . Transportation needs - non-medical: None  Occupational History  . 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 Smoking status: Former Smoker    Packs/day: 0.25    Years: 40.00    Pack years: 10.00    Types: Cigarettes  . Smokeless tobacco: Never Used  . Tobacco comment: 02/18/2017 "quit in 2012"  Substance and Sexual Activity  . Alcohol use: No    Alcohol/week: 0.0 oz  . Drug use: No  . Sexual activity: Not Currently  Other Topics Concern  . None  Social History Narrative  . None     Family History:  The patient's family history includes Heart disease in his mother; Prostate cancer in his brother.   ROS:   Please see the history of present illness.    Chronic significant back discomfort.  Depression.  Anxiety related to inability to control life and activities. All other  systems reviewed and are negative.   PHYSICAL EXAM:   VS:  BP (!) 146/94   Pulse 93   Ht 5\' 11"  (1.803 m)   Wt 257 lb 6.4 oz (116.8 kg)   BMI 35.90 kg/m    GEN: Well nourished, well developed, in no acute distress  HEENT: normal  Neck: no JVD, carotid bruits, or masses Cardiac: RRR; no murmurs, rubs, or gallops,no edema  Respiratory:  clear to auscultation bilaterally, normal work of breathing GI: soft, nontender, nondistended, + BS MS: no deformity or atrophy  Skin: warm and dry, no rash Neuro:  Alert and Oriented x 3, Strength and sensation are intact Psych: euthymic mood, full affect  Wt Readings from Last 3 Encounters:  05/13/17 257 lb 6.4 oz (116.8 kg)  03/10/17 261 lb 6.4 oz (118.6 kg)  02/19/17 245 lb (111.1 kg)      Studies/Labs Reviewed:   EKG:  EKG  Not repeated  Recent Labs: 02/18/2017: ALT 26; B Natriuretic Peptide 14.6; TSH 0.589 03/02/2017: Hemoglobin 12.8; Platelets 220 03/10/2017: BUN 16; Creatinine, Ser 1.06; Magnesium 1.7; Potassium 4.5; Sodium 141   Lipid Panel    Component Value Date/Time   CHOL 107 02/19/2017 0222   CHOL 118 05/13/2016 0831   TRIG 131 02/19/2017 0222   HDL 25 (L) 02/19/2017 0222   HDL 32 (L) 05/13/2016 0831   CHOLHDL 4.3 02/19/2017 0222   VLDL 26 02/19/2017 0222   LDLCALC 56 02/19/2017 0222   LDLCALC 65 05/13/2016 0831    Additional studies/ records that were reviewed today include:  none    ASSESSMENT:    1. Coronary artery disease involving native coronary artery of native heart without angina pectoris   2. Controlled type 2 diabetes mellitus with complication, without long-term current use of insulin ([place])   3. Essential hypertension   4. Ischemic cardiomyopathy   5. Preoperative cardiovascular examination      PLAN:  In order of problems listed above:  1.  He is stable from cardiac standpoint.  He underwent right hip surgery 6 months ago with no difficulty.  He is not on dual antiplatelet therapy.  Pausing  aspirin therapy to allow lumbar spine surgery will not be a problem. 2. Last hemoglobin A1c was greater than 7.  We discussed this.  If he begins exercising after back surgery this will help bring the A1c to less than our target of 7.0. 3. Target blood pressure 130/80 mmHg.  Has not yet had medications this morning. 4. No signs of heart failure. 5. He is cleared for the upcoming lumbar disc surgery.  Okay to pause aspirin for up to 7 days and resume when safe.  Medical follow-up with me in 6 months.  Medication Adjustments/Labs and Tests Ordered: Current medicines are reviewed at length with the patient today.  Concerns regarding medicines are outlined above.  Medication changes, Labs and Tests ordered today are listed in the Patient Instructions below. Patient Instructions  Medication Instructions:  Your physician recommends that you continue on your current medications as directed. Please refer to the Current Medication list given to you today.  Labwork: None  Testing/Procedures: None  Follow-Up: Your physician wants you to follow-up in: 6 months with [name].  You will receive a reminder letter in the mail two months in advance. If you don't receive a letter, please call our office to schedule the follow-up appointment.   Any Other Special Instructions Will Be Listed Below (If Applicable).     If you need a refill on your cardiac medications before your next appointment, please call your pharmacy.      Signed, Sinclair Grooms, MD  05/13/2017 9:58 AM    [address], [place], [place]  [address] Phone: [phone]; Fax: [phone]

## 2017-05-13 ENCOUNTER — Encounter: Payer: Self-pay | Admitting: Interventional Cardiology

## 2017-05-13 ENCOUNTER — Ambulatory Visit (INDEPENDENT_AMBULATORY_CARE_PROVIDER_SITE_OTHER): Payer: Medicare Other | Admitting: Interventional Cardiology

## 2017-05-13 ENCOUNTER — Encounter (INDEPENDENT_AMBULATORY_CARE_PROVIDER_SITE_OTHER): Payer: Self-pay

## 2017-05-13 VITALS — BP 146/94 | HR 93 | Ht 71.0 in | Wt 257.4 lb

## 2017-05-13 DIAGNOSIS — I10 Essential (primary) hypertension: Secondary | ICD-10-CM | POA: Diagnosis not present

## 2017-05-13 DIAGNOSIS — Z01810 Encounter for preprocedural cardiovascular examination: Secondary | ICD-10-CM | POA: Diagnosis not present

## 2017-05-13 DIAGNOSIS — I2510 Atherosclerotic heart disease of native coronary artery without angina pectoris: Secondary | ICD-10-CM | POA: Diagnosis not present

## 2017-05-13 DIAGNOSIS — E118 Type 2 diabetes mellitus with unspecified complications: Secondary | ICD-10-CM | POA: Diagnosis not present

## 2017-05-13 DIAGNOSIS — I255 Ischemic cardiomyopathy: Secondary | ICD-10-CM

## 2017-05-13 NOTE — Patient Instructions (Signed)

## 2017-05-26 ENCOUNTER — Other Ambulatory Visit: Payer: Self-pay

## 2017-05-26 ENCOUNTER — Encounter (HOSPITAL_COMMUNITY): Payer: Self-pay | Admitting: *Deleted

## 2017-05-26 NOTE — Progress Notes (Signed)
Spoke with pt's wife, [name] for pre-op call. Pt has hx of CAD and has 2 stents after a MI. Pt's cardiologist is Dr. Daneen Schick and cardiac clearance is in Epic dated 05/13/17. [name] ok'ed Aspirin to be stopped up to 7 days prior to surgery. Surgery got moved earlier so last dose of Aspirin was yesterday, 05/25/17. Pt is a type 2 diabetic. Pt's most recent A1C was 7.3 on 05/20/17. [name] states that pt's fasting blood sugar is usually between 99-112. Instructed Robin to have pt check his blood sugar in the AM when he gets up and every 2 hours until he leaves for the hospital. If blood sugar is 70 or below, treat with 1/2 cup of clear juice (apple or cranberry) and recheck blood sugar 15 minutes after drinking juice. If blood sugar continues to be 70 or below, call the Short Stay department and ask to speak to a nurse. Robin voiced understanding.

## 2017-05-27 ENCOUNTER — Inpatient Hospital Stay (HOSPITAL_COMMUNITY)
Admission: RE | Admit: 2017-05-27 | Discharge: 2017-05-31 | DRG: 453 | Disposition: A | Discharge status: Home Health | Payer: Medicare Other | Source: Ambulatory Visit | Attending: Neurosurgery | Admitting: Neurosurgery

## 2017-05-27 ENCOUNTER — Inpatient Hospital Stay (HOSPITAL_COMMUNITY): Payer: Medicare Other | Admitting: Anesthesiology

## 2017-05-27 ENCOUNTER — Encounter (HOSPITAL_COMMUNITY): Payer: Self-pay | Admitting: Anesthesiology

## 2017-05-27 ENCOUNTER — Inpatient Hospital Stay (HOSPITAL_COMMUNITY): Payer: Medicare Other

## 2017-05-27 ENCOUNTER — Encounter (HOSPITAL_COMMUNITY)
Admission: RE | Disposition: A | Discharge status: Home Health | Payer: Self-pay | Source: Ambulatory Visit | Attending: Neurosurgery

## 2017-05-27 DIAGNOSIS — Z7982 Long term (current) use of aspirin: Secondary | ICD-10-CM

## 2017-05-27 DIAGNOSIS — M5136 Other intervertebral disc degeneration, lumbar region: Secondary | ICD-10-CM | POA: Diagnosis present

## 2017-05-27 DIAGNOSIS — E118 Type 2 diabetes mellitus with unspecified complications: Secondary | ICD-10-CM | POA: Diagnosis present

## 2017-05-27 DIAGNOSIS — E785 Hyperlipidemia, unspecified: Secondary | ICD-10-CM | POA: Diagnosis present

## 2017-05-27 DIAGNOSIS — Z419 Encounter for procedure for purposes other than remedying health state, unspecified: Secondary | ICD-10-CM

## 2017-05-27 DIAGNOSIS — K219 Gastro-esophageal reflux disease without esophagitis: Secondary | ICD-10-CM | POA: Diagnosis present

## 2017-05-27 DIAGNOSIS — Z6835 Body mass index (BMI) 35.0-35.9, adult: Secondary | ICD-10-CM | POA: Diagnosis not present

## 2017-05-27 DIAGNOSIS — I2510 Atherosclerotic heart disease of native coronary artery without angina pectoris: Secondary | ICD-10-CM | POA: Diagnosis present

## 2017-05-27 DIAGNOSIS — E119 Type 2 diabetes mellitus without complications: Secondary | ICD-10-CM | POA: Diagnosis present

## 2017-05-27 DIAGNOSIS — Z79899 Other long term (current) drug therapy: Secondary | ICD-10-CM | POA: Diagnosis not present

## 2017-05-27 DIAGNOSIS — M549 Dorsalgia, unspecified: Secondary | ICD-10-CM | POA: Diagnosis present

## 2017-05-27 DIAGNOSIS — Y95 Nosocomial condition: Secondary | ICD-10-CM | POA: Diagnosis not present

## 2017-05-27 DIAGNOSIS — I252 Old myocardial infarction: Secondary | ICD-10-CM

## 2017-05-27 DIAGNOSIS — J189 Pneumonia, unspecified organism: Secondary | ICD-10-CM | POA: Diagnosis not present

## 2017-05-27 DIAGNOSIS — M48061 Spinal stenosis, lumbar region without neurogenic claudication: Secondary | ICD-10-CM | POA: Diagnosis present

## 2017-05-27 DIAGNOSIS — M069 Rheumatoid arthritis, unspecified: Secondary | ICD-10-CM | POA: Diagnosis present

## 2017-05-27 DIAGNOSIS — E882 Lipomatosis, not elsewhere classified: Secondary | ICD-10-CM | POA: Diagnosis not present

## 2017-05-27 DIAGNOSIS — Z96641 Presence of right artificial hip joint: Secondary | ICD-10-CM | POA: Diagnosis present

## 2017-05-27 DIAGNOSIS — E669 Obesity, unspecified: Secondary | ICD-10-CM | POA: Diagnosis present

## 2017-05-27 DIAGNOSIS — R509 Fever, unspecified: Secondary | ICD-10-CM

## 2017-05-27 DIAGNOSIS — Z87891 Personal history of nicotine dependence: Secondary | ICD-10-CM | POA: Diagnosis not present

## 2017-05-27 DIAGNOSIS — Z888 Allergy status to other drugs, medicaments and biological substances status: Secondary | ICD-10-CM

## 2017-05-27 DIAGNOSIS — S32009K Unspecified fracture of unspecified lumbar vertebra, subsequent encounter for fracture with nonunion: Secondary | ICD-10-CM | POA: Diagnosis present

## 2017-05-27 DIAGNOSIS — M4727 Other spondylosis with radiculopathy, lumbosacral region: Secondary | ICD-10-CM | POA: Diagnosis present

## 2017-05-27 DIAGNOSIS — M5416 Radiculopathy, lumbar region: Secondary | ICD-10-CM | POA: Diagnosis present

## 2017-05-27 DIAGNOSIS — Z955 Presence of coronary angioplasty implant and graft: Secondary | ICD-10-CM

## 2017-05-27 DIAGNOSIS — I10 Essential (primary) hypertension: Secondary | ICD-10-CM | POA: Diagnosis present

## 2017-05-27 DIAGNOSIS — D72829 Elevated white blood cell count, unspecified: Secondary | ICD-10-CM | POA: Diagnosis not present

## 2017-05-27 DIAGNOSIS — Z91048 Other nonmedicinal substance allergy status: Secondary | ICD-10-CM | POA: Diagnosis not present

## 2017-05-27 HISTORY — DX: Constipation, unspecified: K59.00

## 2017-05-27 LAB — BASIC METABOLIC PANEL
Anion gap: 11 (ref 5–15)
BUN: 14 mg/dL (ref 6–20)
CO2: 28 mmol/L (ref 22–32)
Calcium: 9.2 mg/dL (ref 8.9–10.3)
Chloride: 97 mmol/L — ABNORMAL LOW (ref 101–111)
Creatinine, Ser: 1.07 mg/dL (ref 0.61–1.24)
GFR calc Af Amer: >60 mL/min (ref >60)
GFR calc non Af Amer: >60 mL/min (ref >60)
Glucose, Bld: 115 mg/dL — ABNORMAL HIGH (ref 65–99)
Potassium: 3.6 mmol/L (ref 3.5–5.1)
Sodium: 136 mmol/L (ref 135–145)

## 2017-05-27 LAB — CBC
HCT: 37 % — ABNORMAL LOW (ref 39.0–52.0)
Hemoglobin: 11.7 g/dL — ABNORMAL LOW (ref 13.0–17.0)
MCH: 26.4 pg (ref 26.0–34.0)
MCHC: 31.6 g/dL (ref 30.0–36.0)
MCV: 83.3 fL (ref 78.0–100.0)
Platelets: 294 10*3/uL (ref 150–400)
RBC: 4.44 MIL/uL (ref 4.22–5.81)
RDW: 16.9 % — ABNORMAL HIGH (ref 11.5–15.5)
WBC: 7.5 10*3/uL (ref 4.0–10.5)

## 2017-05-27 LAB — GLUCOSE, CAPILLARY
Glucose-Capillary: 117 mg/dL — ABNORMAL HIGH (ref 65–99)
Glucose-Capillary: 97 mg/dL (ref 65–99)
Glucose-Capillary: 172 mg/dL — ABNORMAL HIGH (ref 65–99)
Glucose-Capillary: 276 mg/dL — ABNORMAL HIGH (ref 65–99)

## 2017-05-27 LAB — TYPE AND SCREEN
ABO/RH(D): B POS
Antibody Screen: NEGATIVE

## 2017-05-27 LAB — HEMOGLOBIN A1C
Hgb A1c MFr Bld: 7.3 % — ABNORMAL HIGH (ref 4.8–5.6)
Mean Plasma Glucose: 162.81 mg/dL

## 2017-05-27 SURGERY — POSTERIOR LUMBAR FUSION 1 LEVEL
Anesthesia: General | Site: Spine Lumbar

## 2017-05-27 MED ORDER — ROCURONIUM BROMIDE 10 MG/ML (PF) SYRINGE
PREFILLED_SYRINGE | INTRAVENOUS | Status: AC
  Filled 2017-05-27: qty 5

## 2017-05-27 MED ORDER — PROPOFOL 10 MG/ML IV BOLUS
INTRAVENOUS | Status: AC
  Filled 2017-05-27: qty 40

## 2017-05-27 MED ORDER — PANTOPRAZOLE SODIUM 40 MG PO TBEC
80.0000 mg | DELAYED_RELEASE_TABLET | Freq: Every day | ORAL | Status: DC
  Administered 2017-05-28 – 2017-05-31 (×4): 80 mg via ORAL
  Filled 2017-05-27 (×4): qty 2

## 2017-05-27 MED ORDER — CHLORTHALIDONE 25 MG PO TABS
25.0000 mg | ORAL_TABLET | Freq: Every day | ORAL | Status: DC
  Administered 2017-05-28 – 2017-05-31 (×3): 25 mg via ORAL
  Filled 2017-05-27 (×4): qty 1

## 2017-05-27 MED ORDER — DEXAMETHASONE SODIUM PHOSPHATE 10 MG/ML IJ SOLN
INTRAMUSCULAR | Status: AC
  Filled 2017-05-27: qty 1

## 2017-05-27 MED ORDER — CYCLOBENZAPRINE HCL 10 MG PO TABS
10.0000 mg | ORAL_TABLET | Freq: Three times a day (TID) | ORAL | Status: DC | PRN
  Administered 2017-05-27 – 2017-05-31 (×7): 10 mg via ORAL
  Filled 2017-05-27 (×7): qty 1

## 2017-05-27 MED ORDER — ONDANSETRON HCL 4 MG/2ML IJ SOLN: 4.0000 mg | Freq: Four times a day (QID) | INTRAMUSCULAR | Status: DC | PRN

## 2017-05-27 MED ORDER — CEFAZOLIN SODIUM-DEXTROSE 2-4 GM/100ML-% IV SOLN: 2.0000 g | INTRAVENOUS | Status: DC

## 2017-05-27 MED ORDER — ROCURONIUM BROMIDE 100 MG/10ML IV SOLN
INTRAVENOUS | Status: DC | PRN
  Administered 2017-05-27: 20 mg via INTRAVENOUS
  Administered 2017-05-27: 50 mg via INTRAVENOUS
  Administered 2017-05-27: 30 mg via INTRAVENOUS
  Administered 2017-05-27: 20 mg via INTRAVENOUS
  Administered 2017-05-27: 10 mg via INTRAVENOUS

## 2017-05-27 MED ORDER — TRAMADOL HCL 50 MG PO TABS
50.0000 mg | ORAL_TABLET | Freq: Three times a day (TID) | ORAL | Status: DC
  Administered 2017-05-27 – 2017-05-31 (×10): 50 mg via ORAL
  Filled 2017-05-27 (×10): qty 1

## 2017-05-27 MED ORDER — CHLORHEXIDINE GLUCONATE CLOTH 2 % EX PADS: 6.0000 | MEDICATED_PAD | Freq: Once | CUTANEOUS | Status: DC

## 2017-05-27 MED ORDER — BUPIVACAINE LIPOSOME 1.3 % IJ SUSP
20.0000 mL | INTRAMUSCULAR | Status: AC
  Administered 2017-05-27: 20 mL
  Filled 2017-05-27: qty 20

## 2017-05-27 MED ORDER — CEFAZOLIN SODIUM-DEXTROSE 2-4 GM/100ML-% IV SOLN
2.0000 g | Freq: Three times a day (TID) | INTRAVENOUS | Status: AC
  Administered 2017-05-27: 2 g via INTRAVENOUS

## 2017-05-27 MED ORDER — SUGAMMADEX SODIUM 200 MG/2ML IV SOLN
INTRAVENOUS | Status: AC
  Filled 2017-05-27: qty 2

## 2017-05-27 MED ORDER — FENTANYL CITRATE (PF) 250 MCG/5ML IJ SOLN
INTRAMUSCULAR | Status: AC
  Filled 2017-05-27: qty 5

## 2017-05-27 MED ORDER — ONDANSETRON HCL 4 MG/2ML IJ SOLN
INTRAMUSCULAR | Status: AC
  Filled 2017-05-27: qty 2

## 2017-05-27 MED ORDER — MENTHOL 3 MG MT LOZG
1.0000 | LOZENGE | OROMUCOSAL | Status: DC | PRN
  Filled 2017-05-27: qty 9

## 2017-05-27 MED ORDER — BACITRACIN ZINC 500 UNIT/GM EX OINT
TOPICAL_OINTMENT | CUTANEOUS | Status: DC | PRN
  Administered 2017-05-27: 1 via TOPICAL

## 2017-05-27 MED ORDER — MIDAZOLAM HCL 2 MG/2ML IJ SOLN
INTRAMUSCULAR | Status: AC
  Filled 2017-05-27: qty 2

## 2017-05-27 MED ORDER — PREGABALIN 75 MG PO CAPS
200.0000 mg | ORAL_CAPSULE | Freq: Every day | ORAL | Status: DC
  Administered 2017-05-27 – 2017-05-30 (×4): 200 mg via ORAL
  Filled 2017-05-27: qty 2
  Filled 2017-05-27 (×3): qty 1

## 2017-05-27 MED ORDER — INSULIN ASPART 100 UNIT/ML ~~LOC~~ SOLN: 0.0000 [IU] | SUBCUTANEOUS | Status: DC

## 2017-05-27 MED ORDER — SUGAMMADEX SODIUM 500 MG/5ML IV SOLN
INTRAVENOUS | Status: DC | PRN
  Administered 2017-05-27: 200 mg via INTRAVENOUS

## 2017-05-27 MED ORDER — BISACODYL 10 MG RE SUPP: 10.0000 mg | Freq: Every day | RECTAL | Status: DC | PRN

## 2017-05-27 MED ORDER — MORPHINE SULFATE (PF) 4 MG/ML IV SOLN
4.0000 mg | INTRAVENOUS | Status: DC | PRN
  Administered 2017-05-27: 4 mg via INTRAVENOUS
  Filled 2017-05-27: qty 1

## 2017-05-27 MED ORDER — DULOXETINE HCL 60 MG PO CPEP
60.0000 mg | ORAL_CAPSULE | Freq: Every day | ORAL | Status: DC
  Administered 2017-05-28 – 2017-05-31 (×4): 60 mg via ORAL
  Filled 2017-05-27: qty 1
  Filled 2017-05-27 (×2): qty 2
  Filled 2017-05-27: qty 1

## 2017-05-27 MED ORDER — LIDOCAINE HCL (CARDIAC) 20 MG/ML IV SOLN
INTRAVENOUS | Status: AC
  Filled 2017-05-27: qty 5

## 2017-05-27 MED ORDER — CEFAZOLIN SODIUM-DEXTROSE 2-4 GM/100ML-% IV SOLN
2.0000 g | Freq: Once | INTRAVENOUS | Status: AC
  Administered 2017-05-27 (×2): 2 g via INTRAVENOUS

## 2017-05-27 MED ORDER — PHENYLEPHRINE 40 MCG/ML (10ML) SYRINGE FOR IV PUSH (FOR BLOOD PRESSURE SUPPORT)
PREFILLED_SYRINGE | INTRAVENOUS | Status: AC
  Filled 2017-05-27: qty 10

## 2017-05-27 MED ORDER — DOCUSATE SODIUM 100 MG PO CAPS
100.0000 mg | ORAL_CAPSULE | Freq: Two times a day (BID) | ORAL | Status: DC
  Administered 2017-05-27 – 2017-05-31 (×8): 100 mg via ORAL
  Filled 2017-05-27 (×8): qty 1

## 2017-05-27 MED ORDER — HYDROMORPHONE HCL 1 MG/ML IJ SOLN
INTRAMUSCULAR | Status: AC
  Administered 2017-05-27: 0.5 mg via INTRAVENOUS
  Filled 2017-05-27: qty 1

## 2017-05-27 MED ORDER — ACETAMINOPHEN 500 MG PO TABS
1000.0000 mg | ORAL_TABLET | Freq: Four times a day (QID) | ORAL | Status: AC
  Administered 2017-05-27 – 2017-05-28 (×4): 1000 mg via ORAL
  Filled 2017-05-27 (×4): qty 2

## 2017-05-27 MED ORDER — POTASSIUM CHLORIDE CRYS ER 20 MEQ PO TBCR
20.0000 meq | EXTENDED_RELEASE_TABLET | Freq: Every day | ORAL | Status: DC
  Administered 2017-05-27 – 2017-05-31 (×5): 20 meq via ORAL
  Filled 2017-05-27 (×6): qty 1

## 2017-05-27 MED ORDER — HYDROMORPHONE HCL 1 MG/ML IJ SOLN
0.2500 mg | INTRAMUSCULAR | Status: DC | PRN
  Administered 2017-05-27 (×3): 0.5 mg via INTRAVENOUS

## 2017-05-27 MED ORDER — PREGABALIN 50 MG PO CAPS: 100.0000 mg | ORAL_CAPSULE | ORAL | Status: DC

## 2017-05-27 MED ORDER — THROMBIN 5000 UNITS EX SOLR
CUTANEOUS | Status: AC
  Filled 2017-05-27: qty 5000

## 2017-05-27 MED ORDER — PHENOL 1.4 % MT LIQD
1.0000 | OROMUCOSAL | Status: DC | PRN
  Administered 2017-05-28: 1 via OROMUCOSAL
  Filled 2017-05-27: qty 177

## 2017-05-27 MED ORDER — LIDOCAINE 2% (20 MG/ML) 5 ML SYRINGE
INTRAMUSCULAR | Status: DC | PRN
  Administered 2017-05-27: 80 mg via INTRAVENOUS

## 2017-05-27 MED ORDER — ALBUMIN HUMAN 5 % IV SOLN
INTRAVENOUS | Status: DC | PRN
  Administered 2017-05-27: 13:00:00 via INTRAVENOUS

## 2017-05-27 MED ORDER — MIDAZOLAM HCL 5 MG/5ML IJ SOLN
INTRAMUSCULAR | Status: DC | PRN
  Administered 2017-05-27: 2 mg via INTRAVENOUS

## 2017-05-27 MED ORDER — PROMETHAZINE HCL 25 MG/ML IJ SOLN: 6.2500 mg | INTRAMUSCULAR | Status: DC | PRN

## 2017-05-27 MED ORDER — SODIUM CHLORIDE 0.9% FLUSH: 3.0000 mL | INTRAVENOUS | Status: DC | PRN

## 2017-05-27 MED ORDER — BUPIVACAINE-EPINEPHRINE (PF) 0.5% -1:200000 IJ SOLN
INTRAMUSCULAR | Status: AC
  Filled 2017-05-27: qty 30

## 2017-05-27 MED ORDER — PREGABALIN 50 MG PO CAPS
100.0000 mg | ORAL_CAPSULE | Freq: Every day | ORAL | Status: DC
  Administered 2017-05-28 – 2017-05-31 (×4): 100 mg via ORAL
  Filled 2017-05-27 (×4): qty 2

## 2017-05-27 MED ORDER — CEFAZOLIN SODIUM 1 G IJ SOLR
INTRAMUSCULAR | Status: AC
  Filled 2017-05-27: qty 10

## 2017-05-27 MED ORDER — OXYCODONE HCL 5 MG PO TABS
10.0000 mg | ORAL_TABLET | ORAL | Status: DC | PRN
  Administered 2017-05-27 – 2017-05-29 (×12): 10 mg via ORAL
  Filled 2017-05-27 (×11): qty 2

## 2017-05-27 MED ORDER — ACETAMINOPHEN 650 MG RE SUPP: 650.0000 mg | RECTAL | Status: DC | PRN

## 2017-05-27 MED ORDER — FENTANYL CITRATE (PF) 100 MCG/2ML IJ SOLN
INTRAMUSCULAR | Status: DC | PRN
  Administered 2017-05-27 (×9): 50 ug via INTRAVENOUS
  Administered 2017-05-27: 100 ug via INTRAVENOUS

## 2017-05-27 MED ORDER — NITROGLYCERIN 0.4 MG SL SUBL
0.4000 mg | SUBLINGUAL_TABLET | SUBLINGUAL | Status: DC | PRN
Start: 2017-05-27 — End: 2017-05-31

## 2017-05-27 MED ORDER — ONDANSETRON HCL 4 MG/2ML IJ SOLN
INTRAMUSCULAR | Status: DC | PRN
  Administered 2017-05-27: 4 mg via INTRAVENOUS

## 2017-05-27 MED ORDER — BUPIVACAINE-EPINEPHRINE (PF) 0.5% -1:200000 IJ SOLN
INTRAMUSCULAR | Status: DC | PRN
  Administered 2017-05-27: 10 mL

## 2017-05-27 MED ORDER — LISINOPRIL 20 MG PO TABS
20.0000 mg | ORAL_TABLET | Freq: Every day | ORAL | Status: DC
  Administered 2017-05-27 – 2017-05-29 (×3): 20 mg via ORAL
  Filled 2017-05-27 (×4): qty 1

## 2017-05-27 MED ORDER — INSULIN ASPART 100 UNIT/ML ~~LOC~~ SOLN
0.0000 [IU] | Freq: Every day | SUBCUTANEOUS | Status: DC
  Administered 2017-05-27: 3 [IU] via SUBCUTANEOUS
  Administered 2017-05-28: 4 [IU] via SUBCUTANEOUS
  Administered 2017-05-30: 2 [IU] via SUBCUTANEOUS

## 2017-05-27 MED ORDER — OXYCODONE HCL 5 MG PO TABS: 5.0000 mg | ORAL_TABLET | Freq: Once | ORAL | Status: DC | PRN

## 2017-05-27 MED ORDER — ONDANSETRON HCL 4 MG PO TABS: 4.0000 mg | ORAL_TABLET | Freq: Four times a day (QID) | ORAL | Status: DC | PRN

## 2017-05-27 MED ORDER — OXYCODONE HCL 5 MG PO TABS: 5.0000 mg | ORAL_TABLET | ORAL | Status: DC | PRN

## 2017-05-27 MED ORDER — CYCLOBENZAPRINE HCL 10 MG PO TABS
ORAL_TABLET | ORAL | Status: AC
  Administered 2017-05-27: 10 mg via ORAL
  Filled 2017-05-27: qty 1

## 2017-05-27 MED ORDER — DEXAMETHASONE SODIUM PHOSPHATE 4 MG/ML IJ SOLN
INTRAMUSCULAR | Status: DC | PRN
Start: 2017-05-27 — End: 2017-05-27
  Administered 2017-05-27: 10 mg via INTRAVENOUS

## 2017-05-27 MED ORDER — 0.9 % SODIUM CHLORIDE (POUR BTL) OPTIME
TOPICAL | Status: DC | PRN
  Administered 2017-05-27: 1000 mL

## 2017-05-27 MED ORDER — POLYETHYLENE GLYCOL 3350 17 GM/SCOOP PO POWD
17.0000 g | Freq: Every day | ORAL | Status: DC | PRN
  Filled 2017-05-27: qty 255

## 2017-05-27 MED ORDER — METFORMIN HCL ER 500 MG PO TB24
750.0000 mg | ORAL_TABLET | Freq: Every day | ORAL | Status: DC
  Administered 2017-05-28 – 2017-05-29 (×2): 750 mg via ORAL
  Filled 2017-05-27 (×3): qty 1.5

## 2017-05-27 MED ORDER — CYCLOBENZAPRINE HCL 10 MG PO TABS: 10.0000 mg | ORAL_TABLET | Freq: Every day | ORAL | Status: DC | PRN

## 2017-05-27 MED ORDER — POLYETHYLENE GLYCOL 3350 17 G PO PACK: 17.0000 g | PACK | Freq: Every day | ORAL | Status: DC | PRN

## 2017-05-27 MED ORDER — PHENYLEPHRINE HCL 10 MG/ML IJ SOLN
INTRAMUSCULAR | Status: DC | PRN
  Administered 2017-05-27 (×3): 40 ug via INTRAVENOUS
  Administered 2017-05-27 (×2): 80 ug via INTRAVENOUS

## 2017-05-27 MED ORDER — OXYCODONE HCL 5 MG/5ML PO SOLN: 5.0000 mg | Freq: Once | ORAL | Status: DC | PRN

## 2017-05-27 MED ORDER — OXYCODONE HCL 5 MG PO TABS
ORAL_TABLET | ORAL | Status: AC
  Filled 2017-05-27: qty 2

## 2017-05-27 MED ORDER — SODIUM CHLORIDE 0.9% FLUSH: 3.0000 mL | Freq: Two times a day (BID) | INTRAVENOUS | Status: DC

## 2017-05-27 MED ORDER — INSULIN ASPART 100 UNIT/ML ~~LOC~~ SOLN
0.0000 [IU] | Freq: Three times a day (TID) | SUBCUTANEOUS | Status: DC
  Administered 2017-05-28 (×2): 4 [IU] via SUBCUTANEOUS
  Administered 2017-05-28 – 2017-05-29 (×2): 3 [IU] via SUBCUTANEOUS
  Administered 2017-05-29 – 2017-05-30 (×2): 4 [IU] via SUBCUTANEOUS
  Administered 2017-05-31: 7 [IU] via SUBCUTANEOUS

## 2017-05-27 MED ORDER — ATORVASTATIN CALCIUM 80 MG PO TABS
80.0000 mg | ORAL_TABLET | Freq: Every day | ORAL | Status: DC
  Administered 2017-05-28 – 2017-05-30 (×3): 80 mg via ORAL
  Filled 2017-05-27 (×3): qty 1

## 2017-05-27 MED ORDER — PROPOFOL 10 MG/ML IV BOLUS
INTRAVENOUS | Status: DC | PRN
  Administered 2017-05-27: 200 mg via INTRAVENOUS
  Administered 2017-05-27: 50 mg via INTRAVENOUS

## 2017-05-27 MED ORDER — VANCOMYCIN HCL 1000 MG IV SOLR
INTRAVENOUS | Status: AC
  Filled 2017-05-27: qty 1000

## 2017-05-27 MED ORDER — GELATIN ABSORBABLE MT POWD
OROMUCOSAL | Status: DC | PRN
  Administered 2017-05-27 (×2): via TOPICAL

## 2017-05-27 MED ORDER — BACITRACIN ZINC 500 UNIT/GM EX OINT
TOPICAL_OINTMENT | CUTANEOUS | Status: AC
  Filled 2017-05-27: qty 28.35

## 2017-05-27 MED ORDER — SODIUM CHLORIDE 0.9 % IR SOLN
Status: DC | PRN
  Administered 2017-05-27: 12:00:00

## 2017-05-27 MED ORDER — NITROGLYCERIN 0.3 MG SL SUBL
0.3000 mg | SUBLINGUAL_TABLET | SUBLINGUAL | Status: DC | PRN
  Filled 2017-05-27: qty 100

## 2017-05-27 MED ORDER — DILTIAZEM HCL ER COATED BEADS 180 MG PO CP24
180.0000 mg | ORAL_CAPSULE | Freq: Every day | ORAL | Status: DC
  Administered 2017-05-28 – 2017-05-31 (×4): 180 mg via ORAL
  Filled 2017-05-27 (×5): qty 1

## 2017-05-27 MED ORDER — VANCOMYCIN HCL 1 G IV SOLR
INTRAVENOUS | Status: DC | PRN
  Administered 2017-05-27: 1000 mg via TOPICAL

## 2017-05-27 MED ORDER — ACETAMINOPHEN 325 MG PO TABS
650.0000 mg | ORAL_TABLET | ORAL | Status: DC | PRN
  Administered 2017-05-29 (×2): 650 mg via ORAL
  Filled 2017-05-27 (×2): qty 2

## 2017-05-27 MED ORDER — CEFAZOLIN SODIUM-DEXTROSE 2-4 GM/100ML-% IV SOLN
INTRAVENOUS | Status: AC
  Filled 2017-05-27: qty 100

## 2017-05-27 MED ORDER — LACTATED RINGERS IV SOLN
INTRAVENOUS | Status: DC
  Administered 2017-05-27 (×2): via INTRAVENOUS

## 2017-05-27 SURGICAL SUPPLY — 70 items
APL SKNCLS STERI-STRIP NONHPOA (GAUZE/BANDAGES/DRESSINGS) ×1
BAG DECANTER FOR FLEXI CONT (MISCELLANEOUS) ×3 IMPLANT
BASKET BONE COLLECTION (BASKET) ×2 IMPLANT
BENZOIN TINCTURE PRP APPL 2/3 (GAUZE/BANDAGES/DRESSINGS) ×3 IMPLANT
BUR MATCHSTICK NEURO 3.0 LAGG (BURR) ×3 IMPLANT
BUR PRECISION FLUTE 6.0 (BURR) ×3 IMPLANT
CANISTER SUCT 3000ML PPV (MISCELLANEOUS) ×3 IMPLANT
CARTRIDGE OIL MAESTRO DRILL (MISCELLANEOUS) ×1 IMPLANT
CLOSURE WOUND 1/2 X4 (GAUZE/BANDAGES/DRESSINGS) ×1
CONNECTOR SPINAL 20 6.35 ROD (Connector) ×6 IMPLANT
CONT SPEC 4OZ CLIKSEAL STRL BL (MISCELLANEOUS) ×3 IMPLANT
COVER BACK TABLE 60X90IN (DRAPES) ×6 IMPLANT
CROSSLINK DANEK (Cage) ×3 IMPLANT
DIFFUSER DRILL AIR PNEUMATIC (MISCELLANEOUS) ×3 IMPLANT
DRAPE C-ARM 42X72 X-RAY (DRAPES) ×6 IMPLANT
DRAPE C-ARMOR (DRAPES) ×2 IMPLANT
DRAPE HALF SHEET 40X57 (DRAPES) ×3 IMPLANT
DRAPE LAPAROTOMY 100X72X124 (DRAPES) ×3 IMPLANT
DRAPE SURG 17X23 STRL (DRAPES) ×12 IMPLANT
DRSG OPSITE POSTOP 4X10 (GAUZE/BANDAGES/DRESSINGS) ×2 IMPLANT
ELECT BLADE 4.0 EZ CLEAN MEGAD (MISCELLANEOUS) ×3
ELECT REM PT RETURN 9FT ADLT (ELECTROSURGICAL) ×3
ELECTRODE BLDE 4.0 EZ CLN MEGD (MISCELLANEOUS) ×1 IMPLANT
ELECTRODE REM PT RTRN 9FT ADLT (ELECTROSURGICAL) ×1 IMPLANT
EVACUATOR 1/8 PVC DRAIN (DRAIN) IMPLANT
GAUZE SPONGE 4X4 12PLY STRL (GAUZE/BANDAGES/DRESSINGS) ×3 IMPLANT
GAUZE SPONGE 4X4 16PLY XRAY LF (GAUZE/BANDAGES/DRESSINGS) ×1 IMPLANT
GLOVE BIO SURGEON STRL SZ8 (GLOVE) ×6 IMPLANT
GLOVE BIO SURGEON STRL SZ8.5 (GLOVE) ×8 IMPLANT
GLOVE BIOGEL PI IND STRL 7.0 (GLOVE) IMPLANT
GLOVE BIOGEL PI IND STRL 7.5 (GLOVE) IMPLANT
GLOVE BIOGEL PI INDICATOR 7.0 (GLOVE) ×10
GLOVE BIOGEL PI INDICATOR 7.5 (GLOVE) ×8
GLOVE SS N UNI LF 6.5 STRL (GLOVE) ×4 IMPLANT
GLOVE SS N UNI LF 7.0 STRL (GLOVE) ×6 IMPLANT
GOWN STRL REUS W/ TWL LRG LVL3 (GOWN DISPOSABLE) IMPLANT
GOWN STRL REUS W/ TWL XL LVL3 (GOWN DISPOSABLE) ×2 IMPLANT
GOWN STRL REUS W/TWL 2XL LVL3 (GOWN DISPOSABLE) IMPLANT
GOWN STRL REUS W/TWL LRG LVL3 (GOWN DISPOSABLE) ×6
GOWN STRL REUS W/TWL XL LVL3 (GOWN DISPOSABLE) ×9
HEMOSTAT POWDER KIT SURGIFOAM (HEMOSTASIS) ×5 IMPLANT
KIT BASIN OR (CUSTOM PROCEDURE TRAY) ×3 IMPLANT
KIT INFUSE X SMALL 1.4CC (Orthopedic Implant) ×2 IMPLANT
KIT TURNOVER KIT B (KITS) ×3 IMPLANT
NDL HYPO 21X1.5 SAFETY (NEEDLE) IMPLANT
NEEDLE HYPO 21X1.5 SAFETY (NEEDLE) ×3 IMPLANT
NEEDLE HYPO 22GX1.5 SAFETY (NEEDLE) ×3 IMPLANT
NS IRRIG 1000ML POUR BTL (IV SOLUTION) ×3 IMPLANT
OIL CARTRIDGE MAESTRO DRILL (MISCELLANEOUS) ×3
PACK LAMINECTOMY NEURO (CUSTOM PROCEDURE TRAY) ×3 IMPLANT
PAD ARMBOARD 7.5X6 YLW CONV (MISCELLANEOUS) ×9 IMPLANT
PATTIES SURGICAL 1X1 (DISPOSABLE) ×2 IMPLANT
PLATE BN 2.15-2.95XLO BAR (Cage) IMPLANT
ROD L635 HEX END UNLINED (Rod) ×2 IMPLANT
SCREW 8.5X50 (Screw) ×4 IMPLANT
SCREW PEDICLE VA L635 7.5X55M (Screw) ×4 IMPLANT
SCREW SET BREAK OFF (Screw) ×30 IMPLANT
SCREW SET ILIAC (Screw) ×12 IMPLANT
SCREW SPINA CLOSED 8.5X70 MAS (Screw) ×4 IMPLANT
SPACER ALTERA 10X31-15 (Spacer) ×2 IMPLANT
STRIP BIOACTIVE 20CC 25X100X8 (Miscellaneous) ×2 IMPLANT
STRIP CLOSURE SKIN 1/2X4 (GAUZE/BANDAGES/DRESSINGS) ×2 IMPLANT
SUT VIC AB 1 CT1 18XBRD ANBCTR (SUTURE) ×2 IMPLANT
SUT VIC AB 1 CT1 8-18 (SUTURE) ×9
SUT VIC AB 2-0 CP2 18 (SUTURE) ×6 IMPLANT
SYR 20CC LL (SYRINGE) ×2 IMPLANT
TOWEL GREEN STERILE (TOWEL DISPOSABLE) ×3 IMPLANT
TOWEL GREEN STERILE FF (TOWEL DISPOSABLE) ×3 IMPLANT
TRAY FOLEY W/METER SILVER 16FR (SET/KITS/TRAYS/PACK) ×3 IMPLANT
WATER STERILE IRR 1000ML POUR (IV SOLUTION) ×3 IMPLANT

## 2017-05-27 NOTE — Transfer of Care (Signed)
Immediate Anesthesia Transfer of Care Note  Patient: Thomas Mcclure  Procedure(s) Performed: POSTERIOR LUMBAR INTERBODY FUSION LUMBAR TWO-LUMBAR THREE, EXTENSION OF EXISTING FUSION WITH ILIAC SCREWS (N/A Spine Lumbar)  Patient Location: PACU  Anesthesia Type:General  Level of Consciousness: awake and patient cooperative  Airway & Oxygen Therapy: Patient Spontanous Breathing and Patient connected to nasal cannula oxygen  Post-op Assessment: Report given to RN, Post -op Vital signs reviewed and stable and Patient moving all extremities  Post vital signs: Reviewed and stable  Last Vitals:  Vitals Value Taken Time  BP    Temp    Pulse    Resp    SpO2      Last Pain:  Vitals:   05/27/17 0823  TempSrc:   PainSc: 8       Patients Stated Pain Goal: 3 (15/17/61 6073)  Complications: No apparent anesthesia complications

## 2017-05-27 NOTE — H&P (Signed)
Subjective: The patient is a 62 year old black male on whom I previously performed a L3-4, L4-5 and L5-S1 decompression, instrumentation, and fusion.  The patient has had some chronic back pain but this has worsened recently.  He has failed medical management.  He was worked up with a lumbar myelo CT which demonstrated stenosis and degenerative changes at L2-3 with a possible pseudoarthrosis at L5-S1.  I discussed the various treatment options with the patient.  He has decided to proceed with surgery.  Past Medical History:  Diagnosis Date  . Baker's cyst    Left calf  . CAD in native artery, 07/15/15 PCI of RCA with DES 07/16/2015   a.   NSTEMI 5/17: LHC - pLAD 20, pLCx 20, OM1 30, pRCA 100, EF 45-50%>> PCI: 2.5 x 24 mm Promus DES to RCA  //  b.   Echo 5/17: mild LVH, EF 50-55%, no RWMA, mod RVE //  c. LHC 6/17: pLAD 20, pLCx 20, OM1 50, pRCA stent ok, EF 35-45% with mod sized inf wall and basal segment aneurysm  . Chronic back pain    "down my back, down my legs" (02/18/2017)  . Constipation   . GERD (gastroesophageal reflux disease)   . Hyperlipidemia   . Hypertension    Dr. [name] [phone]  . Ischemic cardiomyopathy    a. LV-gram at time of LHC in 6/17 with EF 35-45%  //  b. Echo 7/17: EF 45-50%, inferior HK, grade 1 diastolic dysfunction, mildly dilated aortic root, moderately reduced RVSF, mild RAE  . Myocardial infarction ([place]) 2017  . 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 Numbness and tingling of both lower extremities    "on the outside of both sides" (02/18/2017)  . Rheumatoid arthritis ([place])   . Type II diabetes mellitus ([place])    diet controlled    Past Surgical History:  Procedure Laterality Date  . BACK SURGERY    . CARDIAC CATHETERIZATION N/A 07/15/2015   Procedure: Left Heart Cath and Coronary Angiography;  Surgeon: [name], MD;  Location: [facility];  Service: Cardiovascular;  Laterality: N/A;  . CARDIAC CATHETERIZATION N/A  07/15/2015   Procedure: Coronary Stent Intervention;  Surgeon: [name], MD;  Location: [facility];  Service: Cardiovascular;  Laterality: N/A;  . CARDIAC CATHETERIZATION N/A 08/07/2015   Procedure: Left Heart Cath and Coronary Angiography;  Surgeon: [name], MD;  Location: [facility];  Service: Cardiovascular;  Laterality: N/A;  . CORONARY ANGIOPLASTY WITH STENT PLACEMENT  07/2015  . JOINT REPLACEMENT    . LEFT HEART CATH AND CORONARY ANGIOGRAPHY N/A 02/19/2017   Procedure: LEFT HEART CATH AND CORONARY ANGIOGRAPHY;  Surgeon: Martinique, Peter M, MD;  Location: [facility];  Service: Cardiovascular;  Laterality: N/A;  . LUMBAR SPINE SURGERY  03/2009  & 2012  . MASS EXCISION N/A 04/19/2012   Procedure: removal of posterior cervical lipoma;  Surgeon: [name], MD;  Location: [facility];  Service: Neurosurgery;  Laterality: N/A;  Removal of posterior cervical lipoma  . POPLITEAL SYNOVIAL CYST EXCISION Left    "opened up behind my knee; scraped out arthritis"  . SPINAL CORD STIMULATOR INSERTION N/A 01/07/2013   Procedure:  SPINAL CORD STIMULATOR INSERTION;  Surgeon: [name], MD;  Location: [facility];  Service: Neurosurgery;  Laterality: N/A;  . TONSILLECTOMY    . TOTAL HIP ARTHROPLASTY Right 10/03/2016   Procedure: TOTAL HIP ARTHROPLASTY;  Surgeon:  [name], MD;  Location: [facility];  Service: Orthopedics;  Laterality: Right;  . WISDOM TOOTH EXTRACTION     hx of    Allergies  Allergen Reactions  . Brilinta [Ticagrelor] Shortness Of Breath and Other (See Comments)    CHEST PAIN   . Methylprednisolone Other (See Comments)    DIAPHORESIS, HYPOTENSION  . Prednisone Other (See Comments)    DIAPHORESIS, HYPOTENSION  . Toradol [Ketorolac Tromethamine] Other (See Comments)    DIAPHORESIS, HYPOTENSION    . Adhesive [Tape] Rash and Other (See Comments)    "blisters" ( NO SURGICAL TAPE, PLEASE)    Social History   Tobacco Use  . Smoking status:  Former Smoker    Packs/day: 0.25    Years: 40.00    Pack years: 10.00    Types: Cigarettes  . Smokeless tobacco: Never Used  . Tobacco comment: 02/18/2017 "quit in 2012"  Substance Use Topics  . Alcohol use: No    Alcohol/week: 0.0 oz    Family History  Problem Relation Age of Onset  . Heart disease Mother   . Prostate cancer Brother    Prior to Admission medications   Medication Sig Start Date End Date Taking? Authorizing Provider  aspirin EC 81 MG tablet Take 1 tablet (81 mg total) by mouth daily. 07/18/15  Yes Burns, Alexa R, MD  atorvastatin (LIPITOR) 80 MG tablet TAKE 1 TABLET BY MOUTH EVERY DAY AT 6PM Patient taking differently: TAKE 1 TABLET (80mg ) BY MOUTH EVERY DAY AT BEDTIME 08/13/16  Yes [name], MD  chlorthalidone (HYGROTON) 25 MG tablet Take 1 tablet (25 mg total) by mouth daily. 05/27/16  Yes Burtis Junes, NP  cyclobenzaprine (FLEXERIL) 10 MG tablet Take 10 mg by mouth daily as needed for muscle spasms.   Yes [provider]  diazepam (VALIUM) 10 MG tablet Take 10 mg by mouth 2 (two) times daily.    Yes [provider]  DILT-XR 180 MG 24 hr capsule Take 180 mg by mouth once daily 04/09/16  Yes [provider]  DULoxetine (CYMBALTA) 60 MG capsule Take 60 mg by mouth daily.   Yes [provider]  lisinopril (PRINIVIL,ZESTRIL) 20 MG tablet Take 20 mg by mouth at bedtime.  07/07/16  Yes [provider]  metFORMIN (GLUCOPHAGE-XR) 750 MG 24 hr tablet Take 750 mg by mouth at bedtime.  06/07/15  Yes [provider]  Multiple Vitamin (MULTIVITAMIN WITH MINERALS) TABS tablet Take 1 tablet by mouth daily.   Yes [provider]  omeprazole (PRILOSEC) 40 MG capsule Take 40 mg by mouth daily.   Yes [provider]  Oxycodone HCl 10 MG TABS Take 1 tablet (10 mg total) by mouth every 4 (four) hours as needed (pain). scheduled 10/03/16  Yes Chadwell, Vonna Kotyk, PA-C  polyethylene glycol powder (GLYCOLAX/MIRALAX)  powder Take 17 g by mouth daily as needed for moderate constipation. Mix in 8 oz water, juice, soda, coffee or tea and drink 06/07/15  Yes [provider]  potassium chloride SA (K-DUR,KLOR-CON) 20 MEQ tablet Take 1 tablet (20 mEq total) by mouth daily. 08/21/16  Yes [name], MD  pregabalin (LYRICA) 100 MG capsule Take 100-200 mg by mouth See admin instructions. Take 100 mg in the morning and 200 mg at night   Yes [provider]  traMADol (ULTRAM) 50 MG tablet Take 50 mg by mouth 3 (three) times daily. scheduled   Yes [provider]  nitroGLYCERIN (NITROSTAT) 0.4 MG SL tablet PLACE  1 TABLET UNDER THE TONGUE EVERY 5 MINUTES AS NEEDED FOR CHEST PAIN. CALL 911 AT THIRD DOSE WITHIN 15 MINUTES Patient taking differently: PLACE 1 TABLET (0.4mg ) UNDER THE TONGUE EVERY 5 MINUTES AS NEEDED FOR CHEST PAIN. CALL 911 AT THIRD DOSE WITHIN 15 MINUTES 08/22/16   [name], MD  triamcinolone ointment (KENALOG) 0.5 % Apply 1 application topically 2 (two) times daily. Patient not taking: Reported on 05/25/2017 09/12/16   Margarita Mail, PA-C     Review of Systems  Positive ROS: As above  All other systems have been reviewed and were otherwise negative with the exception of those mentioned in the HPI and as above.  Objective: Vital signs in last 24 hours: Temp:  [98.5 F (36.9 C)] 98.5 F (36.9 C) (04/03 0812) Pulse Rate:  [77] 77 (04/03 0812) Resp:  [18] 18 (04/03 0812) BP: (134)/(86) 134/86 (04/03 0832) SpO2:  [97 %] 97 % (04/03 0812) Weight:  [116.6 kg (257 lb)] 116.6 kg (257 lb) (04/03 0812) Estimated body mass index is 35.84 kg/m as calculated from the following:   Height as of this encounter: 5\' 11"  (1.803 m).   Weight as of this encounter: 116.6 kg (257 lb).   General Appearance: Alert Head: Normocephalic, without obvious abnormality, atraumatic Eyes: PERRL, conjunctiva/corneas clear, EOM's intact,    Ears: Normal  Throat: Normal  Neck: Supple, Back:  unremarkable, the patient's lumbar incision is well-healed. Lungs: Clear to auscultation bilaterally, respirations unlabored Heart: Regular rate and rhythm, no murmur, rub or gallop Abdomen: Soft, non-tender Extremities: Extremities normal, atraumatic, no cyanosis or edema Skin: unremarkable  NEUROLOGIC:   Mental status: alert and oriented,Motor Exam - grossly normal, although he gives away to pain. Sensory Exam - grossly normal Reflexes:  Coordination - grossly normal Gait -he walks with an antalgic gait and uses a cane. Balance - grossly normal Cranial Nerves: I: smell Not tested  II: visual acuity  OS: Normal  OD: Normal   II: visual fields Full to confrontation  II: pupils Equal, round, reactive to light  III,VII: ptosis None  III,IV,VI: extraocular muscles  Full ROM  V: mastication Normal  V: facial light touch sensation  Normal  V,VII: corneal reflex  Present  VII: facial muscle function - upper  Normal  VII: facial muscle function - lower Normal  VIII: hearing Not tested  IX: soft palate elevation  Normal  IX,X: gag reflex Present  XI: trapezius strength  5/5  XI: sternocleidomastoid strength 5/5  XI: neck flexion strength  5/5  XII: tongue strength  Normal    Data Review Lab Results  Component Value Date   WBC 7.5 05/27/2017   HGB 11.7 (L) 05/27/2017   HCT 37.0 (L) 05/27/2017   MCV 83.3 05/27/2017   PLT 294 05/27/2017   Lab Results  Component Value Date   NA 136 05/27/2017   K 3.6 05/27/2017   CL 97 (L) 05/27/2017   CO2 28 05/27/2017   BUN 14 05/27/2017   CREATININE 1.07 05/27/2017   GLUCOSE 115 (H) 05/27/2017   Lab Results  Component Value Date   INR 0.96 02/18/2017    Assessment/Plan: L2-3 disc degeneration, spinal stenosis, L5-S1 pseudoarthrosis, lumbago, lumbar radiculopathy, neurogenic claudication: I have discussed the situation with the patient and his wife and reviewed his myelo CT with him.  We have discussed the various treatment options  including surgery.  I have described the surgical treatment option of an exploration of his lumbar fusion with a L2-3 decompression, instrumentation, fusion  and a likely redo instrumentation and fusion at L5-S1.  I have described as surgery to them.  I have shown him surgical models.  I have given him a surgical pamphlet.  We have discussed the risks, benefits, alternatives, expected postoperative course, and likelihood of achieving our goals with surgery.  I have answered all the patient's questions.  He has decided to proceed with surgery.   [name] 05/27/2017 10:11 AM

## 2017-05-27 NOTE — Anesthesia Postprocedure Evaluation (Signed)
Anesthesia Post Note  Patient: Thomas Mcclure  Procedure(s) Performed: POSTERIOR LUMBAR INTERBODY FUSION LUMBAR TWO-LUMBAR THREE, EXTENSION OF EXISTING FUSION WITH ILIAC SCREWS (N/A Spine Lumbar)     Patient location during evaluation: PACU Anesthesia Type: General Level of consciousness: awake and alert Pain management: pain level controlled Vital Signs Assessment: post-procedure vital signs reviewed and stable Respiratory status: spontaneous breathing, nonlabored ventilation, respiratory function stable and patient connected to nasal cannula oxygen Cardiovascular status: blood pressure returned to baseline and stable Postop Assessment: no apparent nausea or vomiting Anesthetic complications: no    Last Vitals:  Vitals:   05/27/17 1851 05/27/17 1938  BP: 130/90 123/77  Pulse: 80 88  Resp:  20  Temp: 36.4 C 36.9 C  SpO2: 90% (!) 89%    Last Pain:  Vitals:   05/27/17 1938  TempSrc: Oral  PainSc:                  Josey Dettmann P Alexandrina Fiorini

## 2017-05-27 NOTE — Anesthesia Preprocedure Evaluation (Addendum)
Anesthesia Evaluation  Patient identified by MRN, date of birth, ID band Patient awake    Reviewed: Allergy & Precautions, NPO status , Patient's Chart, lab work & pertinent test results  Airway Mallampati: III  TM Distance: >3 FB Neck ROM: Full    Dental no notable dental hx.    Pulmonary former smoker,    Pulmonary exam normal breath sounds clear to auscultation       Cardiovascular hypertension, Pt. on medications + CAD, + Past MI and + Cardiac Stents  Normal cardiovascular exam Rhythm:Regular Rate:Normal  ECG: NSR, rate 79  Prox LAD to Mid LAD lesion is 20% stenosed. Prox Cx to Mid Cx lesion is 20% stenosed. Ost 1st Mrg to 1st Mrg lesion is 50% stenosed. Non-stenotic Prox RCA lesion previously treated. The left ventricular systolic function is normal. LV end diastolic pressure is normal. The left ventricular ejection fraction is 50-55% by visual estimate.  1. Nonobstructive CAD. Patent stent in the RCA.  2. Overall good LV function 3. Normal LVEDP  Pt's cardiologist is Dr. Daneen Schick and cardiac clearance is in Epic dated 05/13/17    Neuro/Psych PSYCHIATRIC DISORDERS  Neuromuscular disease    GI/Hepatic Neg liver ROS, GERD  Medicated and Controlled,  Endo/Other  diabetes, Oral Hypoglycemic Agents  Renal/GU negative Renal ROS     Musculoskeletal  (+) Arthritis , Rheumatoid disorders,  Chronic back pain   Abdominal (+) + obese,   Peds  Hematology HLD   Anesthesia Other Findings LUMBAR PSEUDOARTHROSIS  Reproductive/Obstetrics                            Anesthesia Physical Anesthesia Plan  ASA: III  Anesthesia Plan: General   Post-op Pain Management:    Induction: Intravenous  PONV Risk Score and Plan: 2 and Ondansetron, Dexamethasone, Midazolam and Treatment may vary due to age or medical condition  Airway Management Planned: Oral ETT  Additional Equipment:   Intra-op  Plan:   Post-operative Plan: Extubation in OR  Informed Consent: I have reviewed the patients History and Physical, chart, labs and discussed the procedure including the risks, benefits and alternatives for the proposed anesthesia with the patient or authorized representative who has indicated his/her understanding and acceptance.   Dental advisory given  Plan Discussed with: CRNA  Anesthesia Plan Comments: (Potential arterial line)        Anesthesia Quick Evaluation

## 2017-05-27 NOTE — Op Note (Signed)
Brief history: The patient is a 62 year old black male whose had several previous spine surgeries.  He has had a L3-4, L4-5 and L5-S1 decompression, instrumentation and fusion.  He had chronic back pain but has had worsening back pain and leg pain.  He has failed medical management.  He was worked up with a lumbar myelo CT which demonstrated findings consistent with a lumbosacral pseudoarthrosis and adjacent segment stenosis at L2-3.  I discussed the various treatment option with the patient and his wife.  He has weighed the risks, benefits, and alternatives of surgery and decided proceed with surgery.  Preoperative diagnosis: Lumbosacral pseudoarthrosis, L2-3 degenerative disc disease, spinal stenosis compressing both the L2 and the L3 nerve roots; lumbago; lumbar radiculopathy  Postoperative diagnosis: The same  Procedure: Exploration of lumbar fusion/removal of lumbar hardware, bilateral L2-3 laminotomy/foraminotomies to decompress the bilateral L2 and L3 nerve roots(the work required to do this was in addition to the work required to do the posterior lumbar interbody fusion because of the patient's spinal stenosis, facet arthropathy. Etc. requiring a wide decompression of the nerve roots.);  L2-3 transforaminal lumbar interbody fusion with local morselized autograft bone and Kinnex graft extender; insertion of interbody prosthesis at L2-3 (globus peek expandable interbody prosthesis); posterior segmental instrumentation from L2 to ilium with Medtronic titanium pedicle screws and rods; posterior lateral arthrodesis at L2-3 and L5-S1 with local morselized autograft bone and Kinnex bone graft extender; application of pelvic fixation/ilium screws  Surgeon: Dr. Earle Gell  Asst.: Dr. Kary Kos  Anesthesia: Gen. endotracheal  Estimated blood loss: 400 cc  Drains: None  Complications: None  Description of procedure: The patient was brought to the operating room by the anesthesia team. General  endotracheal anesthesia was induced. The patient was turned to the prone position on the Wilson frame. The patient's lumbosacral region was then prepared with Betadine scrub and Betadine solution. Sterile drapes were applied.  I then injected the area to be incised with Marcaine with epinephrine solution. I then used the scalpel to make a linear midline incision over the 2 3, L3-4, L4-5 and L5-S1, incising through his old surgical scar interspace. I then used electrocautery to perform a bilateral subperiosteal dissection exposing the spinous process and lamina of L2 L3, the old hardware, and the upper sacrum.  We then inserted the Verstrac retractor to provide exposure.  We explored the fusion by using electrocautery to expose the old hardware.  We remove the cross connector from the rods.  We then remove the caps from the screws.  We then removed the rods.  We inspected the arthrodesis.  It appeared good at L3-4 and L4-5.  He had appeared to have a pseudoarthrosis at L5-S1 because the S1 pedicle screws were somewhat loose.  In fact the cap had come off the left S1 pedicle screw there was metal wear debris in that area.  We searched for the And the soft tissue could not located.  I did not think it was necessary to chase the cap and create more tissue trauma.  I began the decompression by using the high speed drill to perform laminotomies at L2-3 bilaterally. We then used the Kerrison punches to widen the laminotomy and removed the ligamentum flavum at L2-3 bilaterally.  We encountered quite a bit of epidural lipomatosis.  We used the Kerrison punches to remove the medial facets at L2-3 bilaterally. We performed wide foraminotomies about the bilateral L2 and L3 nerve roots completing the decompression.  We now turned our attention to  the posterior lumbar interbody fusion. I used a scalpel to incise the intervertebral disc at L2-3 bilaterally. I then performed a partial intervertebral discectomy at L2-3  bilaterally using the pituitary forceps. We prepared the vertebral endplates at H4-1 bilaterally for the fusion by removing the soft tissues with the curettes. We then used the trial spacers to pick the appropriate sized interbody prosthesis. We prefilled his prosthesis with a combination of local morselized autograft bone that we obtained during the decompression as well as Kinnex bone graft extender. We inserted the prefilled prosthesis into the interspace at L2-3, we then expanded the prosthesis. There was a good snug fit of the prosthesis in the interspace. We then filled and the remainder of the intervertebral disc space with local morselized autograft bone, bone morphogenic protein-soaked collagen sponges, and Kinnex. This completed the posterior lumbar interbody arthrodesis.  We now turned attention to the instrumentation. Under fluoroscopic guidance we cannulated the bilateral L2 pedicles with the bone probe. We then removed the bone probe. We then tapped the pedicle with a 6.59millimeter tap. We then removed the tap. We probed inside the tapped pedicle with a ball probe to rule out cortical breaches. We then inserted a 7.5 x 55 millimeter pedicle screw into the L2 pedicles bilaterally under fluoroscopic guidance. We then palpated along the medial aspect of the pedicles to rule out cortical breaches. There were none. The nerve roots were not injured.  We removed the loose S1 pedicle screws and replaced them with 8.5 x 45 mm pedicle screws.   We then used electrocautery to expose the patient's bilateral posterior superior iliac spine.  We attempted to use fluoroscopic guidance to help [place] cannulate the patient's bilateral ilium but it was quite limited because of the patient's body habitus and the bed.  We cannulated the patient's bilateral ilium with bone probe.  I removed the bone probe and felt inside the tract and rule out cortical breaches.  I then tapped the ileum with a 7.5 mm tap.  We probed inside  the tap hole and again ruled out cortical breaches.  We then inserted a 8.5 x 70 mm screw into the ileum bilaterally.  We got good bony purchase.  We then cut the appropriate length rod.  We then connected the unilateral pedicle screws and ilium screws with a rod and connectors bilaterally. We compressed the construct at L2-3 and secured the rod in place with the caps. We then tightened the caps appropriately. This completed the instrumentation from L2 to the ileum bilaterally.  We now turned our attention to the posterior lateral arthrodesis at L2-3 and a redo posterior lateral arthrodesis at L5-S1.  We cleared the soft tissues from the pseudoarthrosis at L5-S1 bilaterally using electrocautery.  We used the high-speed drill to decorticate the remainder of the facets, pars, transverse process at L2-3 and L5-S1. We then applied a combination of local morselized autograft bone, bone morphogenic protein-soaked collagen sponge, and Kinnex bone graft extender over these decorticated posterior lateral structures. This completed the posterior lateral arthrodesis.  We then obtained hemostasis using bipolar electrocautery. We irrigated the wound out with bacitracin solution. We inspected the thecal sac and nerve roots and noted they were well decompressed. We then removed the retractor. We placed vancomycin powder in the wound. We reapproximated patient's thoracolumbar fascia with interrupted #1 Vicryl suture. We reapproximated patient's subcutaneous tissue with interrupted 2-0 Vicryl suture. The reapproximated patient's skin with Steri-Strips and benzoin. The wound was then coated with bacitracin ointment. A sterile  dressing was applied. The drapes were removed. The patient was subsequently returned to the supine position where they were extubated by the anesthesia team. He was then transported to the post anesthesia care unit in stable condition. All sponge instrument and needle counts were reportedly correct at the  end of this case.

## 2017-05-27 NOTE — Progress Notes (Signed)
Subjective: The patient is alert and pleasant.  He is in no apparent distress.  He looks well.  Objective: Vital signs in last 24 hours: Temp:  [97.5 F (36.4 C)-98.5 F (36.9 C)] 97.5 F (36.4 C) (04/03 1700) Pulse Rate:  [77-96] 96 (04/03 1700) Resp:  [16-18] 16 (04/03 1700) BP: (123-134)/(75-86) 123/75 (04/03 1700) SpO2:  [97 %] 97 % (04/03 1700) Weight:  [116.6 kg (257 lb)] 116.6 kg (257 lb) (04/03 0812) Estimated body mass index is 35.84 kg/m as calculated from the following:   Height as of this encounter: 5\' 11"  (1.803 m).   Weight as of this encounter: 116.6 kg (257 lb).   Intake/Output from previous day: No intake/output data recorded. Intake/Output this shift: Total I/O In: 2376 [I.V.:1926; IV Piggyback:450] Out: 850 [Urine:600; Blood:250]  Physical exam the patient is alert and pleasant.  He is moving his lower extremities well.  Lab Results: Recent Labs    05/27/17 0823  WBC 7.5  HGB 11.7*  HCT 37.0*  PLT 294   BMET Recent Labs    05/27/17 0823  NA 136  K 3.6  CL 97*  CO2 28  GLUCOSE 115*  BUN 14  CREATININE 1.07  CALCIUM 9.2    Studies/Results: Dg Lumbar Spine Complete  Result Date: 05/27/2017 CLINICAL DATA:  Posterior fusion at L2-3 EXAM: DG C-ARM 61-120 MIN; LUMBAR SPINE - COMPLETE 4+ VIEW COMPARISON:  03/17/2017 FLUOROSCOPY TIME:  Fluoroscopy Time:  56 seconds Radiation Exposure Index (if provided by the fluoroscopic device): Not available Number of Acquired Spot Images: 5 FINDINGS: Pedicle screws are noted at L2 with interbody fusion at L2-3. Changes of prior fusion at L3-4, L4-5 and L5-S1 are seen. S2 screws are noted extending into the ileum new from the prior exam. IMPRESSION: Postsurgical changes at L2-3 and also inferiorly extending into the sacrum. Posterior fusion elements are not present. Electronically Signed   By: Inez Catalina M.D.   On: 05/27/2017 15:13   Dg C-arm 1-60 Min  Result Date: 05/27/2017 CLINICAL DATA:  Posterior fusion at  L2-3 EXAM: DG C-ARM 61-120 MIN; LUMBAR SPINE - COMPLETE 4+ VIEW COMPARISON:  03/17/2017 FLUOROSCOPY TIME:  Fluoroscopy Time:  56 seconds Radiation Exposure Index (if provided by the fluoroscopic device): Not available Number of Acquired Spot Images: 5 FINDINGS: Pedicle screws are noted at L2 with interbody fusion at L2-3. Changes of prior fusion at L3-4, L4-5 and L5-S1 are seen. S2 screws are noted extending into the ileum new from the prior exam. IMPRESSION: Postsurgical changes at L2-3 and also inferiorly extending into the sacrum. Posterior fusion elements are not present. Electronically Signed   By: Inez Catalina M.D.   On: 05/27/2017 15:13   Dg C-arm 1-60 Min  Result Date: 05/27/2017 CLINICAL DATA:  Posterior fusion at L2-3 EXAM: DG C-ARM 61-120 MIN; LUMBAR SPINE - COMPLETE 4+ VIEW COMPARISON:  03/17/2017 FLUOROSCOPY TIME:  Fluoroscopy Time:  56 seconds Radiation Exposure Index (if provided by the fluoroscopic device): Not available Number of Acquired Spot Images: 5 FINDINGS: Pedicle screws are noted at L2 with interbody fusion at L2-3. Changes of prior fusion at L3-4, L4-5 and L5-S1 are seen. S2 screws are noted extending into the ileum new from the prior exam. IMPRESSION: Postsurgical changes at L2-3 and also inferiorly extending into the sacrum. Posterior fusion elements are not present. Electronically Signed   By: Inez Catalina M.D.   On: 05/27/2017 15:13    Assessment/Plan: The patient is doing well.  I spoke with his wife.  LOS: 0 days     [name] 05/27/2017, 5:11 PM

## 2017-05-27 NOTE — Anesthesia Procedure Notes (Signed)
Procedure Name: Intubation Date/Time: 05/27/2017 10:23 AM Performed by: Lieutenant Diego, CRNA Pre-anesthesia Checklist: Patient identified, Emergency Drugs available, Suction available and Patient being monitored Patient Re-evaluated:Patient Re-evaluated prior to induction Oxygen Delivery Method: Circle system utilized Preoxygenation: Pre-oxygenation with 100% oxygen Induction Type: IV induction Ventilation: Mask ventilation without difficulty Laryngoscope Size: Mac and 3 Grade View: Grade I Tube type: Oral Tube size: 7.5 mm Number of attempts: 1 Airway Equipment and Method: Stylet and Oral airway Placement Confirmation: ETT inserted through vocal cords under direct vision,  positive ETCO2 and breath sounds checked- equal and bilateral Secured at: 23 cm Tube secured with: Tape Dental Injury: Teeth and Oropharynx as per pre-operative assessment  Comments: Intubated per Donnetta Simpers, EMT student

## 2017-05-28 LAB — BASIC METABOLIC PANEL
Anion gap: 8 (ref 5–15)
BUN: 14 mg/dL (ref 6–20)
CO2: 29 mmol/L (ref 22–32)
Calcium: 8.5 mg/dL — ABNORMAL LOW (ref 8.9–10.3)
Chloride: 101 mmol/L (ref 101–111)
Creatinine, Ser: 1.07 mg/dL (ref 0.61–1.24)
GFR calc Af Amer: >60 mL/min (ref >60)
GFR calc non Af Amer: >60 mL/min (ref >60)
Glucose, Bld: 121 mg/dL — ABNORMAL HIGH (ref 65–99)
Potassium: 4 mmol/L (ref 3.5–5.1)
Sodium: 138 mmol/L (ref 135–145)

## 2017-05-28 LAB — CBC
HCT: 31.3 % — ABNORMAL LOW (ref 39.0–52.0)
Hemoglobin: 10.1 g/dL — ABNORMAL LOW (ref 13.0–17.0)
MCH: 26.9 pg (ref 26.0–34.0)
MCHC: 32.3 g/dL (ref 30.0–36.0)
MCV: 83.2 fL (ref 78.0–100.0)
Platelets: 275 10*3/uL (ref 150–400)
RBC: 3.76 MIL/uL — ABNORMAL LOW (ref 4.22–5.81)
RDW: 17.4 % — ABNORMAL HIGH (ref 11.5–15.5)
WBC: 12.5 10*3/uL — ABNORMAL HIGH (ref 4.0–10.5)

## 2017-05-28 LAB — GLUCOSE, CAPILLARY
Glucose-Capillary: 126 mg/dL — ABNORMAL HIGH (ref 65–99)
Glucose-Capillary: 152 mg/dL — ABNORMAL HIGH (ref 65–99)
Glucose-Capillary: 188 mg/dL — ABNORMAL HIGH (ref 65–99)
Glucose-Capillary: 337 mg/dL — ABNORMAL HIGH (ref 65–99)

## 2017-05-28 MED FILL — Gelatin Absorbable MT Powder: OROMUCOSAL | Qty: 1 | Status: AC

## 2017-05-28 MED FILL — Thrombin For Soln 5000 Unit: CUTANEOUS | Qty: 5000 | Status: AC

## 2017-05-28 NOTE — Progress Notes (Signed)
Subjective: Patient reports Schnur well no numbness in his legs no pain in his legs addition of back pain  Objective: Vital signs in last 24 hours: Temp:  [97.5 F (36.4 C)-98.7 F (37.1 C)] 98.7 F (37.1 C) (04/04 0419) Pulse Rate:  [77-105] 97 (04/04 0419) Resp:  [16-22] 20 (04/04 0419) BP: (99-134)/(61-90) 119/76 (04/04 0419) SpO2:  [89 %-98 %] 98 % (04/04 0419) Weight:  [116.6 kg (257 lb)] 116.6 kg (257 lb) (04/03 0812)  Intake/Output from previous day: 04/03 0701 - 04/04 0700 In: 2376 [I.V.:1926; IV Piggyback:450] Out: 1050 [Urine:800; Blood:250] Intake/Output this shift: No intake/output data recorded.  Awake alert strength out of 5 wound clean dry and intact  Lab Results: Recent Labs    05/27/17 0823 05/28/17 0526  WBC 7.5 12.5*  HGB 11.7* 10.1*  HCT 37.0* 31.3*  PLT 294 275   BMET Recent Labs    05/27/17 0823 05/28/17 0526  NA 136 138  K 3.6 4.0  CL 97* 101  CO2 28 29  GLUCOSE 115* 121*  BUN 14 14  CREATININE 1.07 1.07  CALCIUM 9.2 8.5*    Studies/Results: Dg Lumbar Spine Complete  Result Date: 05/27/2017 CLINICAL DATA:  Posterior fusion at L2-3 EXAM: DG C-ARM 61-120 MIN; LUMBAR SPINE - COMPLETE 4+ VIEW COMPARISON:  03/17/2017 FLUOROSCOPY TIME:  Fluoroscopy Time:  56 seconds Radiation Exposure Index (if provided by the fluoroscopic device): Not available Number of Acquired Spot Images: 5 FINDINGS: Pedicle screws are noted at L2 with interbody fusion at L2-3. Changes of prior fusion at L3-4, L4-5 and L5-S1 are seen. S2 screws are noted extending into the ileum new from the prior exam. IMPRESSION: Postsurgical changes at L2-3 and also inferiorly extending into the sacrum. Posterior fusion elements are not present. Electronically Signed   By: Inez Catalina M.D.   On: 05/27/2017 15:13   Dg C-arm 1-60 Min  Result Date: 05/27/2017 CLINICAL DATA:  Posterior fusion at L2-3 EXAM: DG C-ARM 61-120 MIN; LUMBAR SPINE - COMPLETE 4+ VIEW COMPARISON:  03/17/2017  FLUOROSCOPY TIME:  Fluoroscopy Time:  56 seconds Radiation Exposure Index (if provided by the fluoroscopic device): Not available Number of Acquired Spot Images: 5 FINDINGS: Pedicle screws are noted at L2 with interbody fusion at L2-3. Changes of prior fusion at L3-4, L4-5 and L5-S1 are seen. S2 screws are noted extending into the ileum new from the prior exam. IMPRESSION: Postsurgical changes at L2-3 and also inferiorly extending into the sacrum. Posterior fusion elements are not present. Electronically Signed   By: Inez Catalina M.D.   On: 05/27/2017 15:13   Dg C-arm 1-60 Min  Result Date: 05/27/2017 CLINICAL DATA:  Posterior fusion at L2-3 EXAM: DG C-ARM 61-120 MIN; LUMBAR SPINE - COMPLETE 4+ VIEW COMPARISON:  03/17/2017 FLUOROSCOPY TIME:  Fluoroscopy Time:  56 seconds Radiation Exposure Index (if provided by the fluoroscopic device): Not available Number of Acquired Spot Images: 5 FINDINGS: Pedicle screws are noted at L2 with interbody fusion at L2-3. Changes of prior fusion at L3-4, L4-5 and L5-S1 are seen. S2 screws are noted extending into the ileum new from the prior exam. IMPRESSION: Postsurgical changes at L2-3 and also inferiorly extending into the sacrum. Posterior fusion elements are not present. Electronically Signed   By: Inez Catalina M.D.   On: 05/27/2017 15:13    Assessment/Plan: Mobilized today with physical therapy and transition from IV to oral pain medication observable Monday possible discharge tomorrow  LOS: 1 day     Aniyiah Zell P 05/28/2017, 7:32  AM    

## 2017-05-28 NOTE — Evaluation (Signed)
Occupational Therapy Evaluation and Discharge Patient Details Name: Thomas Mcclure MRN: [id number] DOB: [birth year] Today's Date: 05/28/2017    History of Present Illness Pt is a 62 y.o. male s/p L2-3 PLIF. PMHx: CAD, Chronic back pain, GERD< Hyperlipidemia, HTN, Ischemic cardiomyopathy, MI, RA, DM2, R THA 2018, Spinal cord stimulator insertion 2014, Lumbar spine sx 2011, 2012.   Clinical Impression   Pt reports he was mod I with ADL PTA. Currently pt supervision with ADL and functional mobility with the exception of min assist for LB ADL. Pt has AE at home and is familiar with use, wife can assist as needed with LB ADL. All back, safety, and ADL education completed with pt. Pt planning to d/c home with 24/7 supervision from family. No further acute OT needs identified; signing off at this time. Please re-consult if needs change. Thank you for this referral.    Follow Up Recommendations  No OT follow up;Supervision - Intermittent    Equipment Recommendations  None recommended by OT    Recommendations for Other Services       Precautions / Restrictions Precautions Precautions: Back;Fall Precaution Booklet Issued: No Precaution Comments: Educated pt on 3/3 back precautions Required Braces or Orthoses: Spinal Brace Spinal Brace: Lumbar corset;Applied in sitting position Restrictions Weight Bearing Restrictions: No      Mobility Bed Mobility Overal bed mobility: Needs Assistance Bed Mobility: Rolling;Sidelying to Sit Rolling: Supervision Sidelying to sit: Supervision       General bed mobility comments: HOB flat with use of bed rail. Good technique. Supervision for safety. Increased time required  Transfers Overall transfer level: Needs assistance Equipment used: Rolling walker (2 wheeled) Transfers: Sit to/from Stand Sit to Stand: Min guard         General transfer comment: Min guard for safety. Cues for hand placement and technique. Increased time and effort     Balance Overall balance assessment: Needs assistance Sitting-balance support: Feet supported;No upper extremity supported Sitting balance-Leahy Scale: Good     Standing balance support: Bilateral upper extremity supported Standing balance-Leahy Scale: Poor Standing balance comment: RW for support                           ADL either performed or assessed with clinical judgement   ADL Overall ADL's : Needs assistance/impaired Eating/Feeding: Set up;Sitting   Grooming: Min guard;Standing Grooming Details (indicate cue type and reason): Educated on use of 2 cups for oral care Upper Body Bathing: Set up;Sitting   Lower Body Bathing: Minimal assistance;Sit to/from stand   Upper Body Dressing : Set up;Sitting   Lower Body Dressing: Minimal assistance;Sit to/from stand Lower Body Dressing Details (indicate cue type and reason): Educated on compensatory strategies for LB ADL; pt reports he has AE at home that he is familiar with using. Wife can assist as needed Toilet Transfer: Supervision/safety;Ambulation;BSC;RW     Toileting - Clothing Manipulation Details (indicate cue type and reason): Educated on proper technique for peri care without twisting and use of wet wipes   Tub/Shower Transfer Details (indicate cue type and reason): Educated on use of shower chair for safety with bathing; pt verbalized understanding Functional mobility during ADLs: Supervision/safety;Rolling walker General ADL Comments: Educated pt on maintaining back precautions during functional activities, keeping frequently used items at counter top height, frequent mobility throughout the day upon return home, and log roll for bed mobility.     Vision         Perception  Praxis      Pertinent Vitals/Pain Pain Assessment: Faces Faces Pain Scale: Hurts even more Pain Location: back Pain Descriptors / Indicators: Aching;Grimacing Pain Intervention(s): Monitored during  session;Repositioned;Patient requesting pain meds-RN notified     Hand Dominance Right   Extremity/Trunk Assessment Upper Extremity Assessment Upper Extremity Assessment: Overall WFL for tasks assessed   Lower Extremity Assessment Lower Extremity Assessment: Defer to PT evaluation   Cervical / Trunk Assessment Cervical / Trunk Assessment: Other exceptions Cervical / Trunk Exceptions: s/p spinal sx   Communication Communication Communication: No difficulties   Cognition Arousal/Alertness: Awake/alert Behavior During Therapy: WFL for tasks assessed/performed Overall Cognitive Status: Within Functional Limits for tasks assessed                                     General Comments       Exercises     Shoulder Instructions      Home Living Family/patient expects to be discharged to:: Private residence Living Arrangements: Spouse/significant other Available Help at Discharge: Family;Available 24 hours/day Type of Home: House Home Access: Stairs to enter [employer] of Steps: 2   Home Layout: One level     Bathroom Shower/Tub: Occupational psychologist: Handicapped height     Home Equipment: Environmental consultant - 2 wheels;Grab bars - toilet;Toilet riser;Grab bars - tub/shower;Shower seat;Cane - single point;Adaptive equipment Adaptive Equipment: Reacher;Sock aid        Prior Functioning/Environment Level of Independence: Independent with assistive device(s)        Comments: Cane for community mobility, mod I with ADL        OT Problem List:        OT Treatment/Interventions:      OT Goals(Current goals can be found in the care plan section) Acute Rehab OT Goals Patient Stated Goal: return home OT Goal Formulation: All assessment and education complete, DC therapy  OT Frequency:     Barriers to D/C:            Co-evaluation              AM-PAC PT "6 Clicks" Daily Activity     Outcome Measure Help from another person  eating meals?: None Help from another person taking care of personal grooming?: A Little Help from another person toileting, which includes using toliet, bedpan, or urinal?: A Little Help from another person bathing (including washing, rinsing, drying)?: A Little Help from another person to put on and taking off regular upper body clothing?: None Help from another person to put on and taking off regular lower body clothing?: A Little 6 Click Score: 20   End of Session Equipment Utilized During Treatment: Rolling walker Nurse Communication: Mobility status;Other (comment)(no equipment or f/u needs)  Activity Tolerance: Patient tolerated treatment well Patient left: in chair;with call bell/phone within reach  OT Visit Diagnosis: Other abnormalities of gait and mobility (R26.89);Pain Pain - part of body: (back)                Time: 2263-3354 OT Time Calculation (min): 27 min Charges:  OT General Charges $OT Visit: 1 Visit OT Evaluation $OT Eval Moderate Complexity: 1 Mod OT Treatments $Self Care/Home Management : 8-22 mins G-Codes:     Governor Mat[place] A. Ulice Brilliant, M.S., OTR/L Pager: [place] 05/28/2017, 9:58 AM

## 2017-05-28 NOTE — Evaluation (Signed)
Physical Therapy Evaluation Patient Details Name: Thomas Mcclure MRN: [id number] DOB: 1956-02-02 Today's Date: 05/28/2017   History of Present Illness  Pt is a 62 y.o. male s/p L2-3 PLIF. PMHx: CAD, Chronic back pain, GERD< Hyperlipidemia, HTN, Ischemic cardiomyopathy, MI, RA, DM2, R THA 2018, Spinal cord stimulator insertion 2014, Lumbar spine sx 2011, 2012.  Clinical Impression  Pt admitted with above diagnosis. Pt currently with functional limitations due to the deficits listed below (see PT Problem List). At the time of PT eval pt was able to perform transfers and ambulation with gross min guard assist to supervision for safety with RW for support. Pt was able to negotiate stairs this session without difficulty. Acutely, pt will benefit from skilled PT to increase their independence and safety with mobility to allow discharge to the venue listed below.     Follow Up Recommendations No PT follow up;Supervision for mobility/OOB    Equipment Recommendations  Rolling walker with 5" wheels(bariatric - per pt request. He has a standard RW)    Recommendations for Other Services       Precautions / Restrictions Precautions Precautions: Back;Fall Precaution Booklet Issued: No Precaution Comments: Educated pt on 3/3 back precautions Required Braces or Orthoses: Spinal Brace Spinal Brace: Lumbar corset;Applied in sitting position Restrictions Weight Bearing Restrictions: No      Mobility  Bed Mobility Overal bed mobility: Needs Assistance Bed Mobility: Rolling;Sit to Sidelying Rolling: Modified independent (Device/Increase time) Sidelying to sit: Supervision     Sit to sidelying: Supervision General bed mobility comments: HOB flat with rails lowered to simulate home environment. Good technique.   Transfers Overall transfer level: Needs assistance Equipment used: Rolling walker (2 wheeled) Transfers: Sit to/from Stand Sit to Stand: Min guard;Supervision         General  transfer comment: Min guard initially for safety progressing to supervision by end of session. Increased time due to pain.   Ambulation/Gait Ambulation/Gait assistance: Supervision Ambulation Distance (Feet): 250 Feet Assistive device: Rolling walker (2 wheeled) Gait Pattern/deviations: Step-through pattern;Decreased stride length;Trunk flexed Gait velocity: Decreased Gait velocity interpretation: Below normal speed for age/gender General Gait Details: VC's for improved posture and fluidity of walker movement  Stairs Stairs: Yes Stairs assistance: Supervision Stair Management: One rail Right;Step to pattern;Forwards Number of Stairs: 5 General stair comments: VC's for sequencing and general safety with stair negotiation. Pt was able to complete without unsteadiness or LOB.   Wheelchair Mobility    Modified Rankin (Stroke Patients Only)       Balance Overall balance assessment: Needs assistance Sitting-balance support: Feet supported;No upper extremity supported Sitting balance-Leahy Scale: Good     Standing balance support: Bilateral upper extremity supported Standing balance-Leahy Scale: Poor Standing balance comment: RW for support                             Pertinent Vitals/Pain Pain Assessment: Faces Faces Pain Scale: Hurts even more Pain Location: back Pain Descriptors / Indicators: Aching;Grimacing Pain Intervention(s): Monitored during session    Home Living Family/patient expects to be discharged to:: Private residence Living Arrangements: Spouse/significant other Available Help at Discharge: Family;Available 24 hours/day Type of Home: House Home Access: Stairs to enter   [employer] of Steps: 2 Home Layout: One level Home Equipment: Walker - 2 wheels;Grab bars - toilet;Toilet riser;Grab bars - tub/shower;Shower seat;Cane - single point;Adaptive equipment 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      Prior  Function Level of Independence: Independent with assistive device(s)         Comments: Cane for community mobility, mod I with ADL     Hand Dominance   Dominant Hand: Right    Extremity/Trunk Assessment   Upper Extremity Assessment Upper Extremity Assessment: Overall WFL for tasks assessed    Lower Extremity Assessment Lower Extremity Assessment: Generalized weakness(Consistent with pre-op diagnosis)    Cervical / Trunk Assessment Cervical / Trunk Assessment: Other exceptions Cervical / Trunk Exceptions: s/p spinal sx  Communication   Communication: No difficulties  Cognition Arousal/Alertness: Awake/alert Behavior During Therapy: WFL for tasks assessed/performed Overall Cognitive Status: Within Functional Limits for tasks assessed                                        General Comments      Exercises     Assessment/Plan    PT Assessment Patient needs continued PT services  PT Problem List Decreased strength;Decreased range of motion;Decreased activity tolerance;Decreased mobility;Decreased balance;Decreased knowledge of use of DME;Decreased knowledge of precautions;Decreased safety awareness;Pain       PT Treatment Interventions DME instruction;Gait training;Stair training;Functional mobility training;Therapeutic activities;Therapeutic exercise;Neuromuscular re-education;Patient/family education    PT Goals (Current goals can be found in the Care Plan section)  Acute Rehab PT Goals Patient Stated Goal: return home PT Goal Formulation: With patient Time For Goal Achievement: 06/04/17 Potential to Achieve Goals: Good    Frequency Min 5X/week   Barriers to discharge        Co-evaluation               AM-PAC PT "6 Clicks" Daily Activity  Outcome Measure Difficulty turning over in bed (including adjusting bedclothes, sheets and blankets)?: None Difficulty moving from lying on back to sitting on the side of the bed? : A  Little Difficulty sitting down on and standing up from a chair with arms (e.g., wheelchair, bedside commode, etc,.)?: A Little Help needed moving to and from a bed to chair (including a wheelchair)?: A Little Help needed walking in hospital room?: A Little Help needed climbing 3-5 steps with a railing? : A Little 6 Click Score: 19    End of Session Equipment Utilized During Treatment: Gait belt(Brace not delivered yet) Activity Tolerance: Patient tolerated treatment well Patient left: in bed;with call bell/phone within reach Nurse Communication: Mobility status PT Visit Diagnosis: Unsteadiness on feet (R26.81);Pain Pain - part of body: (back)    Time: 6283-1517 PT Time Calculation (min) (ACUTE ONLY): 32 min   Charges:   PT Evaluation $PT Eval Moderate Complexity: 1 Mod PT Treatments $Gait Training: 8-22 mins   PT G Codes:        Rolinda Roan, PT, DPT Acute Rehabilitation Services Pager: [phone]   Thelma Comp 05/28/2017, 11:45 AM

## 2017-05-29 ENCOUNTER — Inpatient Hospital Stay (HOSPITAL_COMMUNITY): Payer: Medicare Other

## 2017-05-29 LAB — GLUCOSE, CAPILLARY
Glucose-Capillary: 163 mg/dL — ABNORMAL HIGH (ref 65–99)
Glucose-Capillary: 95 mg/dL (ref 65–99)
Glucose-Capillary: 133 mg/dL — ABNORMAL HIGH (ref 65–99)
Glucose-Capillary: 194 mg/dL — ABNORMAL HIGH (ref 65–99)

## 2017-05-29 LAB — CBC WITH DIFFERENTIAL/PLATELET
Basophils Absolute: 0 10*3/uL (ref 0.0–0.1)
Basophils Relative: 0 %
Eosinophils Absolute: 0.1 10*3/uL (ref 0.0–0.7)
Eosinophils Relative: 1 %
HCT: 30.9 % — ABNORMAL LOW (ref 39.0–52.0)
Hemoglobin: 9.7 g/dL — ABNORMAL LOW (ref 13.0–17.0)
Lymphocytes Relative: 21 %
Lymphs Abs: 2.9 10*3/uL (ref 0.7–4.0)
MCH: 25.9 pg — ABNORMAL LOW (ref 26.0–34.0)
MCHC: 31.4 g/dL (ref 30.0–36.0)
MCV: 82.6 fL (ref 78.0–100.0)
Monocytes Absolute: 1.6 10*3/uL — ABNORMAL HIGH (ref 0.1–1.0)
Monocytes Relative: 11 %
Neutro Abs: 9.2 10*3/uL — ABNORMAL HIGH (ref 1.7–7.7)
Neutrophils Relative %: 67 %
Platelets: 233 10*3/uL (ref 150–400)
RBC: 3.74 MIL/uL — ABNORMAL LOW (ref 4.22–5.81)
RDW: 17.3 % — ABNORMAL HIGH (ref 11.5–15.5)
WBC: 13.9 10*3/uL — ABNORMAL HIGH (ref 4.0–10.5)

## 2017-05-29 LAB — BASIC METABOLIC PANEL
Anion gap: 10 (ref 5–15)
BUN: 13 mg/dL (ref 6–20)
CO2: 27 mmol/L (ref 22–32)
Calcium: 8.5 mg/dL — ABNORMAL LOW (ref 8.9–10.3)
Chloride: 99 mmol/L — ABNORMAL LOW (ref 101–111)
Creatinine, Ser: 1.14 mg/dL (ref 0.61–1.24)
GFR calc Af Amer: >60 mL/min (ref >60)
GFR calc non Af Amer: >60 mL/min (ref >60)
Glucose, Bld: 159 mg/dL — ABNORMAL HIGH (ref 65–99)
Potassium: 3.6 mmol/L (ref 3.5–5.1)
Sodium: 136 mmol/L (ref 135–145)

## 2017-05-29 MED ORDER — FLEET ENEMA 7-19 GM/118ML RE ENEM: 1.0000 | ENEMA | Freq: Every day | RECTAL | Status: DC | PRN

## 2017-05-29 MED ORDER — HYDROXYZINE HCL 50 MG/ML IM SOLN
50.0000 mg | Freq: Four times a day (QID) | INTRAMUSCULAR | Status: DC | PRN
  Administered 2017-05-29: 50 mg via INTRAMUSCULAR
  Filled 2017-05-29: qty 1

## 2017-05-29 MED ORDER — DEXAMETHASONE SODIUM PHOSPHATE 10 MG/ML IJ SOLN
6.0000 mg | Freq: Once | INTRAMUSCULAR | Status: AC
  Administered 2017-05-29: 6 mg via INTRAVENOUS
  Filled 2017-05-29: qty 1

## 2017-05-29 MED ORDER — OXYCODONE HCL 5 MG PO TABS
15.0000 mg | ORAL_TABLET | ORAL | Status: DC | PRN
  Administered 2017-05-29 – 2017-05-31 (×12): 15 mg via ORAL
  Filled 2017-05-29 (×12): qty 3

## 2017-05-29 NOTE — Progress Notes (Signed)
Physical Therapy Treatment Patient Details Name: Thomas Mcclure MRN: [id number] DOB: Jul 24, 1955 Today's Date: 05/29/2017    History of Present Illness Pt is a 62 y.o. male s/p L2-3 PLIF. PMHx: CAD, Chronic back pain, GERD< Hyperlipidemia, HTN, Ischemic cardiomyopathy, MI, RA, DM2, R THA 2018, Spinal cord stimulator insertion 2014, Lumbar spine sx 2011, 2012.    PT Comments    Pt required increased assistance this session for all aspects of functional mobility. Pt reports pain is his biggest limiting factor, however noted increased weakness on the RLE during ambulation. Pt had difficulty clearing floor, demonstrating a heel strike, and advancing RLE. Will continue to follow and progress as able per POC.    Follow Up Recommendations  No PT follow up;Supervision for mobility/OOB     Equipment Recommendations  Rolling walker with 5" wheels(bariatric - per pt request. He has a standard RW)    Recommendations for Other Services       Precautions / Restrictions Precautions Precautions: Back;Fall Precaution Booklet Issued: No Precaution Comments: Educated pt on 3/3 back precautions Required Braces or Orthoses: Spinal Brace Spinal Brace: Lumbar corset;Applied in sitting position Restrictions Weight Bearing Restrictions: No    Mobility  Bed Mobility Overal bed mobility: Needs Assistance Bed Mobility: Rolling;Sit to Sidelying Rolling: Supervision Sidelying to sit: Mod assist       General bed mobility comments: HOB flat and rails lowered to simulate home environment. Pt required increased time and cues for proper log roll technique. Upon attempt to sit up, pt reached up and grabbed therapist around the shoulder to pull himself up. Mod assist provided.   Transfers Overall transfer level: Needs assistance Equipment used: Rolling walker (2 wheeled) Transfers: Sit to/from Stand Sit to Stand: Mod assist         General transfer comment: Assist provided for power-up to full stand.  Pt attempted several times and mod assist provided to achieve full standing position.   Ambulation/Gait Ambulation/Gait assistance: Min assist Ambulation Distance (Feet): 75 Feet Assistive device: Rolling walker (2 wheeled) Gait Pattern/deviations: Step-through pattern;Decreased stride length;Trunk flexed Gait velocity: Decreased Gait velocity interpretation: Below normal speed for age/gender General Gait Details: Frequent cues for improved posture and general safety with RW. Noted that B knees were flexed, and pt had increased difficulty advancing RLE. Knees buckled x3 during gait training with heavy min assist to recover.    Stairs         General stair comments: Unable to attempt today  Wheelchair Mobility    Modified Rankin (Stroke Patients Only)       Balance Overall balance assessment: Needs assistance Sitting-balance support: Feet supported;No upper extremity supported Sitting balance-Leahy Scale: Good     Standing balance support: Bilateral upper extremity supported Standing balance-Leahy Scale: Poor Standing balance comment: RW for support                            Cognition Arousal/Alertness: Awake/alert Behavior During Therapy: WFL for tasks assessed/performed Overall Cognitive Status: Within Functional Limits for tasks assessed                                        Exercises      General Comments        Pertinent Vitals/Pain Pain Assessment: Faces Faces Pain Scale: Hurts even more Pain Location: back Pain Descriptors / Indicators: Aching;Grimacing Pain Intervention(s): Monitored  during session    Home Living                      Prior Function            PT Goals (current goals can now be found in the care plan section) Acute Rehab PT Goals Patient Stated Goal: return home PT Goal Formulation: With patient Time For Goal Achievement: 06/04/17 Potential to Achieve Goals: Good Progress towards PT  goals: Progressing toward goals    Frequency    Min 5X/week      PT Plan Current plan remains appropriate    Co-evaluation              AM-PAC PT "6 Clicks" Daily Activity  Outcome Measure  Difficulty turning over in bed (including adjusting bedclothes, sheets and blankets)?: None Difficulty moving from lying on back to sitting on the side of the bed? : A Little Difficulty sitting down on and standing up from a chair with arms (e.g., wheelchair, bedside commode, etc,.)?: A Little Help needed moving to and from a bed to chair (including a wheelchair)?: A Little Help needed walking in hospital room?: A Little Help needed climbing 3-5 steps with a railing? : Total 6 Click Score: 17    End of Session Equipment Utilized During Treatment: Gait belt;Back brace Activity Tolerance: Patient tolerated treatment well Patient left: in bed;with call bell/phone within reach Nurse Communication: Mobility status PT Visit Diagnosis: Unsteadiness on feet (R26.81);Pain Pain - part of body: (back)     Time: 0936-1000 PT Time Calculation (min) (ACUTE ONLY): 24 min  Charges:  $Gait Training: 23-37 mins                    G Codes:       Rolinda Roan, PT, DPT Acute Rehabilitation Services Pager: [phone]    Thelma Comp 05/29/2017, 11:32 AM

## 2017-05-29 NOTE — Progress Notes (Signed)
Pt transferred from [facility] s/p back surgery, alert and oriented, c/o of back pain, pt settled in bed with call light within pt's reach, safety concern addressed accordingly, was reassured and will continue to monitor, v/s stable. Thomas Mcclure, Thomas Mcclure

## 2017-05-29 NOTE — Progress Notes (Signed)
Subjective: Patient reports Patient doing well with regard to his lumbar radiculopathy completely resolved pain and numbness in his legs a lot of soreness in his back. But ambulating and voiding  Objective: Vital signs in last 24 hours: Temp:  [98.3 F (36.8 C)-101.7 F (38.7 C)] 98.7 F (37.1 C) (04/05 0741) Pulse Rate:  [105-122] 111 (04/05 0741) Resp:  [18-22] 18 (04/05 0741) BP: (97-122)/(64-75) 115/69 (04/05 0741) SpO2:  [92 %-100 %] 92 % (04/05 0741)  Intake/Output from previous day: 04/04 0701 - 04/05 0700 In: 240 [P.O.:240] Out: -  Intake/Output this shift: Total I/O In: 360 [P.O.:360] Out: -   Awake alert strength out of 5 wound clean dry and intact  Lab Results: Recent Labs    05/27/17 0823 05/28/17 0526  WBC 7.5 12.5*  HGB 11.7* 10.1*  HCT 37.0* 31.3*  PLT 294 275   BMET Recent Labs    05/27/17 0823 05/28/17 0526  NA 136 138  K 3.6 4.0  CL 97* 101  CO2 28 29  GLUCOSE 115* 121*  BUN 14 14  CREATININE 1.07 1.07  CALCIUM 9.2 8.5*    Studies/Results: Dg Lumbar Spine Complete  Result Date: 05/27/2017 CLINICAL DATA:  Posterior fusion at L2-3 EXAM: DG C-ARM 61-120 MIN; LUMBAR SPINE - COMPLETE 4+ VIEW COMPARISON:  03/17/2017 FLUOROSCOPY TIME:  Fluoroscopy Time:  56 seconds Radiation Exposure Index (if provided by the fluoroscopic device): Not available Number of Acquired Spot Images: 5 FINDINGS: Pedicle screws are noted at L2 with interbody fusion at L2-3. Changes of prior fusion at L3-4, L4-5 and L5-S1 are seen. S2 screws are noted extending into the ileum new from the prior exam. IMPRESSION: Postsurgical changes at L2-3 and also inferiorly extending into the sacrum. Posterior fusion elements are not present. Electronically Signed   By: Inez Catalina M.D.   On: 05/27/2017 15:13   Dg C-arm 1-60 Min  Result Date: 05/27/2017 CLINICAL DATA:  Posterior fusion at L2-3 EXAM: DG C-ARM 61-120 MIN; LUMBAR SPINE - COMPLETE 4+ VIEW COMPARISON:  03/17/2017 FLUOROSCOPY  TIME:  Fluoroscopy Time:  56 seconds Radiation Exposure Index (if provided by the fluoroscopic device): Not available Number of Acquired Spot Images: 5 FINDINGS: Pedicle screws are noted at L2 with interbody fusion at L2-3. Changes of prior fusion at L3-4, L4-5 and L5-S1 are seen. S2 screws are noted extending into the ileum new from the prior exam. IMPRESSION: Postsurgical changes at L2-3 and also inferiorly extending into the sacrum. Posterior fusion elements are not present. Electronically Signed   By: Inez Catalina M.D.   On: 05/27/2017 15:13   Dg C-arm 1-60 Min  Result Date: 05/27/2017 CLINICAL DATA:  Posterior fusion at L2-3 EXAM: DG C-ARM 61-120 MIN; LUMBAR SPINE - COMPLETE 4+ VIEW COMPARISON:  03/17/2017 FLUOROSCOPY TIME:  Fluoroscopy Time:  56 seconds Radiation Exposure Index (if provided by the fluoroscopic device): Not available Number of Acquired Spot Images: 5 FINDINGS: Pedicle screws are noted at L2 with interbody fusion at L2-3. Changes of prior fusion at L3-4, L4-5 and L5-S1 are seen. S2 screws are noted extending into the ileum new from the prior exam. IMPRESSION: Postsurgical changes at L2-3 and also inferiorly extending into the sacrum. Posterior fusion elements are not present. Electronically Signed   By: Inez Catalina M.D.   On: 05/27/2017 15:13    Assessment/Plan: Continue to mobilize with physical and occupational therapy. Possible discharge tomorrow better pain control.  LOS: 2 days     Aune Adami P 05/29/2017, 9:56 AM

## 2017-05-29 NOTE — Progress Notes (Signed)
Attempted to call report to receiving RN, RN not ready at this time, will call back once ready.

## 2017-05-29 NOTE — Progress Notes (Signed)
On call [name] Peers NP notified of the result of pt CBC,BMP, new order received for pCXR. Order carried out.

## 2017-05-29 NOTE — Progress Notes (Signed)
Report  Given to [employer]. Patient to transfer to 3w30. Wife aware about transfer.

## 2017-05-30 ENCOUNTER — Other Ambulatory Visit: Payer: Self-pay

## 2017-05-30 DIAGNOSIS — J189 Pneumonia, unspecified organism: Secondary | ICD-10-CM | POA: Diagnosis present

## 2017-05-30 DIAGNOSIS — D72829 Elevated white blood cell count, unspecified: Secondary | ICD-10-CM | POA: Diagnosis not present

## 2017-05-30 LAB — GLUCOSE, CAPILLARY
Glucose-Capillary: 152 mg/dL — ABNORMAL HIGH (ref 65–99)
Glucose-Capillary: 140 mg/dL — ABNORMAL HIGH (ref 65–99)
Glucose-Capillary: 197 mg/dL — ABNORMAL HIGH (ref 65–99)
Glucose-Capillary: 206 mg/dL — ABNORMAL HIGH (ref 65–99)

## 2017-05-30 LAB — URINALYSIS, ROUTINE W REFLEX MICROSCOPIC
Bilirubin Urine: NEGATIVE
Glucose, UA: NEGATIVE mg/dL
Hgb urine dipstick: NEGATIVE
Ketones, ur: NEGATIVE mg/dL
Leukocytes, UA: NEGATIVE
Nitrite: NEGATIVE
Protein, ur: NEGATIVE mg/dL
Specific Gravity, Urine: 1.011 (ref 1.005–1.030)
pH: 6 (ref 5.0–8.0)

## 2017-05-30 LAB — BASIC METABOLIC PANEL
Anion gap: 15 (ref 5–15)
BUN: 17 mg/dL (ref 6–20)
CO2: 22 mmol/L (ref 22–32)
Calcium: 8.7 mg/dL — ABNORMAL LOW (ref 8.9–10.3)
Chloride: 97 mmol/L — ABNORMAL LOW (ref 101–111)
Creatinine, Ser: 0.99 mg/dL (ref 0.61–1.24)
GFR calc Af Amer: >60 mL/min (ref >60)
GFR calc non Af Amer: >60 mL/min (ref >60)
Glucose, Bld: 181 mg/dL — ABNORMAL HIGH (ref 65–99)
Potassium: 4.7 mmol/L (ref 3.5–5.1)
Sodium: 134 mmol/L — ABNORMAL LOW (ref 135–145)

## 2017-05-30 LAB — CBC
HCT: 32.1 % — ABNORMAL LOW (ref 39.0–52.0)
Hemoglobin: 10.6 g/dL — ABNORMAL LOW (ref 13.0–17.0)
MCH: 27.4 pg (ref 26.0–34.0)
MCHC: 33 g/dL (ref 30.0–36.0)
MCV: 82.9 fL (ref 78.0–100.0)
Platelets: 153 10*3/uL (ref 150–400)
RBC: 3.87 MIL/uL — ABNORMAL LOW (ref 4.22–5.81)
RDW: 17.4 % — ABNORMAL HIGH (ref 11.5–15.5)
WBC: 14.1 10*3/uL — ABNORMAL HIGH (ref 4.0–10.5)

## 2017-05-30 LAB — STREP PNEUMONIAE URINARY ANTIGEN: Strep Pneumo Urinary Antigen: NEGATIVE

## 2017-05-30 MED ORDER — SODIUM CHLORIDE 0.9 % IV SOLN
1.0000 g | Freq: Three times a day (TID) | INTRAVENOUS | Status: DC
  Administered 2017-05-30 – 2017-05-31 (×3): 1 g via INTRAVENOUS
  Filled 2017-05-30 (×5): qty 1

## 2017-05-30 MED ORDER — POTASSIUM CHLORIDE CRYS ER 20 MEQ PO TBCR
40.0000 meq | EXTENDED_RELEASE_TABLET | Freq: Once | ORAL | Status: AC
  Administered 2017-05-30: 40 meq via ORAL
  Filled 2017-05-30: qty 2

## 2017-05-30 MED ORDER — VANCOMYCIN HCL 10 G IV SOLR
2500.0000 mg | Freq: Once | INTRAVENOUS | Status: AC
  Administered 2017-05-30: 2500 mg via INTRAVENOUS
  Filled 2017-05-30 (×2): qty 2500

## 2017-05-30 MED ORDER — MAGNESIUM OXIDE 400 (241.3 MG) MG PO TABS
400.0000 mg | ORAL_TABLET | Freq: Two times a day (BID) | ORAL | Status: AC
  Administered 2017-05-30 (×2): 400 mg via ORAL
  Filled 2017-05-30 (×2): qty 1

## 2017-05-30 MED ORDER — VANCOMYCIN HCL IN DEXTROSE 750-5 MG/150ML-% IV SOLN
750.0000 mg | Freq: Two times a day (BID) | INTRAVENOUS | Status: DC
  Administered 2017-05-31: 750 mg via INTRAVENOUS
  Filled 2017-05-30 (×2): qty 150

## 2017-05-30 NOTE — Progress Notes (Signed)
Physical Therapy Treatment Patient Details Name: Thomas Mcclure MRN: [id number] DOB: 07-28-1955 Today's Date: 05/30/2017    History of Present Illness Pt is a 62 y.o. male s/p L2-3 PLIF. PMHx: CAD, Chronic back pain, GERD< Hyperlipidemia, HTN, Ischemic cardiomyopathy, MI, RA, DM2, R THA 2018, Spinal cord stimulator insertion 2014, Lumbar spine sx 2011, 2012.    PT Comments    Pt progressing towards functional mobility goals. Pt demonstrated bed mobility, transfers, ambulation, and stair negotiation min guard with RW this session. He is most limited by post-op pain at this time. Pt required frequent reminders to maintain back precautions throughout session. Reinforced back precautions and educated pt and his wife on mobility expectations and car transfer. Will continue to follow acutely and progress as tolerated.    Follow Up Recommendations  No PT follow up;Supervision for mobility/OOB     Equipment Recommendations  Rolling walker with 5" wheels    Recommendations for Other Services       Precautions / Restrictions Precautions Precautions: Back;Fall Precaution Booklet Issued: No Precaution Comments: Educated pt on 3/3 back precautions Required Braces or Orthoses: Spinal Brace Spinal Brace: Lumbar corset;Applied in sitting position(VCs for proper brace application) Restrictions Weight Bearing Restrictions: No    Mobility  Bed Mobility Overal bed mobility: Needs Assistance Bed Mobility: Rolling;Supine to Sit Rolling: Supervision Sidelying to sit: Min guard       General bed mobility comments: HOB flat without use of rails. VCs to maintian log roll technique. min guard for supine to sit with increased time and effort required  Transfers Overall transfer level: Needs assistance Equipment used: Rolling walker (2 wheeled) Transfers: Sit to/from Stand Sit to Stand: Min assist         General transfer comment: min assist for steadying of RW. increased time and effort x2  attempts to rise. VCs for hand placement  Ambulation/Gait Ambulation/Gait assistance: Min guard Ambulation Distance (Feet): 250 Feet Assistive device: Rolling walker (2 wheeled) Gait Pattern/deviations: Step-through pattern;Decreased stride length;Trunk flexed Gait velocity: Decreased Gait velocity interpretation: Below normal speed for age/gender General Gait Details: frequent cues for upright posture and safe management of RW. chair follow from wife but did not need to use. VCs to maintain back precautions   Stairs Stairs: Yes   Stair Management: One rail Right;Step to pattern;Forwards Number of Stairs: 5 General stair comments: VCs fo pattern and safety with stair negoatiation. no LOB noted  Wheelchair Mobility    Modified Rankin (Stroke Patients Only)       Balance Overall balance assessment: Needs assistance Sitting-balance support: Feet supported;No upper extremity supported Sitting balance-Leahy Scale: Good     Standing balance support: Bilateral upper extremity supported Standing balance-Leahy Scale: Poor Standing balance comment: bil UE on RW for support                            Cognition Arousal/Alertness: Awake/alert Behavior During Therapy: WFL for tasks assessed/performed Overall Cognitive Status: Within Functional Limits for tasks assessed                                 General Comments: pt agitated when arrived to room, but calmed down as session progressed      Exercises      General Comments  [name] present throughout session      Pertinent Vitals/Pain Pain Assessment: Faces Faces Pain Scale: Hurts even more Pain  Location: back Pain Descriptors / Indicators: Aching;Grimacing Pain Intervention(s): Monitored during session;Limited activity within patient's tolerance;Repositioned;RN gave pain meds during session    Home Living Family/patient expects to be discharged to:: Private residence Living Arrangements:  Spouse/significant other                  Prior Function            PT Goals (current goals can now be found in the care plan section) Acute Rehab PT Goals Patient Stated Goal: return home PT Goal Formulation: With patient Time For Goal Achievement: 06/04/17 Potential to Achieve Goals: Good Progress towards PT goals: Progressing toward goals    Frequency    Min 5X/week      PT Plan Current plan remains appropriate    Co-evaluation              AM-PAC PT "6 Clicks" Daily Activity  Outcome Measure  Difficulty turning over in bed (including adjusting bedclothes, sheets and blankets)?: None Difficulty moving from lying on back to sitting on the side of the bed? : A Little Difficulty sitting down on and standing up from a chair with arms (e.g., wheelchair, bedside commode, etc,.)?: A Little Help needed moving to and from a bed to chair (including a wheelchair)?: A Little Help needed walking in hospital room?: A Little Help needed climbing 3-5 steps with a railing? : A Little 6 Click Score: 19    End of Session Equipment Utilized During Treatment: Gait belt;Back brace Activity Tolerance: Patient tolerated treatment well Patient left: in chair;with call bell/phone within reach;with chair alarm set Nurse Communication: Mobility status PT Visit Diagnosis: Unsteadiness on feet (R26.81);Pain Pain - part of body: (back)     Time: 6384-5364 PT Time Calculation (min) (ACUTE ONLY): 27 min  Charges:  $Gait Training: 8-22 mins $Self Care/Home Management: 8-22                    G Codes:      Vic Ripper, SPT  Vic Ripper 05/30/2017, 10:54 AM

## 2017-05-30 NOTE — Progress Notes (Signed)
Pharmacy Antibiotic Note  Thomas Mcclure is a 62 y.o. male admitted on 05/27/2017 for back surgery.  Admission has been complicated by fever, leukocytosis, CXR concerning for PNA.  Pharmacy has been consulted for Vancomycin and Cefepime dosing.  Plan: Vancomycin 2500mg  IV x 1 dose Vancomycin 750mg  IV q12h Goal vanc trough 15-20 mcg/ml Cefepime 1gm IV q8h   Height: 5\' 11"  (180.3 cm) Weight: 257 lb (116.6 kg) IBW/kg (Calculated) : 75.3  Temp (24hrs), Avg:98.7 F (37.1 C), Min:97.5 F (36.4 C), Max:102.5 F (39.2 C)  Recent Labs  Lab 05/27/17 0823 05/28/17 0526 05/29/17 1903  WBC 7.5 12.5* 13.9*  CREATININE 1.07 1.07 1.14    Estimated Creatinine Clearance: 88.4 mL/min (by C-G formula based on SCr of 1.14 mg/dL).    Allergies  Allergen Reactions  . Brilinta [Ticagrelor] Shortness Of Breath and Other (See Comments)    CHEST PAIN   . Methylprednisolone Other (See Comments)    DIAPHORESIS, HYPOTENSION  . Prednisone Other (See Comments)    DIAPHORESIS, HYPOTENSION  . Toradol [Ketorolac Tromethamine] Other (See Comments)    DIAPHORESIS, HYPOTENSION    . Adhesive [Tape] Rash and Other (See Comments)    "blisters" ( NO SURGICAL TAPE, PLEASE)    Antimicrobials this admission: Vanc 4/5 >> Cefepime 4/6 >>  Dose adjustments this admission:   Microbiology results: pending  Thank you for allowing pharmacy to be a part of this patient's care.  [employer], Pharm.D., BCPS Clinical Pharmacist Pager: [phone] Clinical phone for 05/30/2017 from 8:30-4:00 is [phone]. After 4pm, please call Main Rx [phone]) for assistance. 05/30/2017 12:23 PM

## 2017-05-30 NOTE — Progress Notes (Signed)
Patient ID: Thomas Mcclure, male   DOB: 01/07/1956, 62 y.o.   MRN: [id number] Subjective: Patient reports he's doing well, eager for D/C,has night sweats but no cough.   Objective: Vital signs in last 24 hours: Temp:  [97.5 F (36.4 C)-102.5 F (39.2 C)] 97.5 F (36.4 C) (04/06 0853) Pulse Rate:  [87-120] 87 (04/06 0853) Resp:  [16-20] 18 (04/06 0853) BP: (95-118)/(65-77) 118/74 (04/06 0853) SpO2:  [87 %-100 %] 94 % (04/06 0853)  Intake/Output from previous day: 04/05 0701 - 04/06 0700 In: 360 [P.O.:360] Out: -  Intake/Output this shift: No intake/output data recorded.  Neurologic: Grossly normal  Lab Results: Lab Results  Component Value Date   WBC 13.9 (H) 05/29/2017   HGB 9.7 (L) 05/29/2017   HCT 30.9 (L) 05/29/2017   MCV 82.6 05/29/2017   PLT 233 05/29/2017   Lab Results  Component Value Date   INR 0.96 02/18/2017   BMET Lab Results  Component Value Date   NA 136 05/29/2017   K 3.6 05/29/2017   CL 99 (L) 05/29/2017   CO2 27 05/29/2017   GLUCOSE 159 (H) 05/29/2017   BUN 13 05/29/2017   CREATININE 1.14 05/29/2017   CALCIUM 8.5 (L) 05/29/2017    Studies/Results: Dg Chest Port 1 View  Result Date: 05/29/2017 CLINICAL DATA:  62 y/o  M; fever. EXAM: PORTABLE CHEST 1 VIEW COMPARISON:  09/22/2016 chest radiograph. FINDINGS: Patchy consolidation in the lower lobes. No pleural effusion or pneumothorax. Stable normal cardiac silhouette given projection and technique. Partially visualize thoracic spinal stimulator. Bones are unremarkable. IMPRESSION: Patchy consolidations in the lower lobes compatible with pneumonia. Electronically Signed   By: Kristine Garbe M.D.   On: 05/29/2017 22:37    Assessment/Plan: Doing well, from surgery, but has HAP - will ask medicine to consult  Estimated body mass index is 35.84 kg/m as calculated from the following:   Height as of this encounter: 5\' 11"  (1.803 m).   Weight as of this encounter: 116.6 kg (257 lb).    LOS:  3 days    Thomas Mcclure S 05/30/2017, 10:58 AM

## 2017-05-30 NOTE — Consult Note (Signed)
[facility] Medical Consultation  Thomas Mcclure TDD:[id number] DOB: [birth year] DOA: 05/27/2017 PCP: [name], MD   Requesting physician: Sherley Bounds  Date of consultation: 05/30/17 Reason for consultation: HCAP  Impression/Recommendations Principal Problem:   HCAP (healthcare-associated pneumonia) Active Problems:   Hypertension   CAD (coronary artery disease)   Diabetes mellitus type 2, controlled, with complications ([place])   Lumbar pseudoarthrosis   Leukocytosis  #1. Healthcare associated pneumonia. Patient underwent PLIF 4 days ago. Is been working with physical therapy and using incentive spirometry. Yesterday spiked a temp of 102 developed slight increased oxygen demand and leukocytosis with tacycardia. Chest x-ray at that time consistent with pneumonia. Today he's afebrile no dynamically stable and not hypoxic. -follow blood cultures -Obtain sputum culture as able -antibiotics per HCAP protocol/pharmacy -strep pneumo urine antigen -continue to mobilize -oxygen supplementation as indicated -Continue incentive spirometry -Monitor closely  #2. Leukocytosis. WBC 13.9 yesterday. Likely related to above. -obtain urinallysis -see #1.   #3.diabetes. Fair control. -Continue current regimen  #4. Hypertension. Controlled -continue current regimen -monitor closely    [facility] will followup again tomorrow. Please contact [facility] if  can be of assistance in the meanwhile. Thank you for this consultation.  Chief Complaint: fever, leukocytosis, chest xray with inflitrates  HPI:  Thomas Mcclure is a 62 yo asked medical history that includes hypertension,edema cardiomyopathy, CAD,iabetes, chronic back pain underwent PLIF L2-3 4 days ago. Was doing well ambulating eating using his incentive spirometry pain was well controlled until yesterday. He states yesterday he felt weak most of the day. Wife states that he was "sweating all day long". Last evening he spiked a temperature greater than  102 developed tachycardia had a slight increased oxygen demand. CBC revealed WBCs trending up and chest xray consistent with pneumonia. patient denies any coughing shortness of breath chest pain palpitation. He denies any abdominal pain nausea vomiting. He denies dysuria hematuria frequency or urgency. He does indicate difficulty with bowel movements. He states he feels better today than he did yesterday as his appetite is improved and pain is controlled.   Review of Systems:  10 point review of systems complete and all systems are negative except as indicated in the history of present illness  Past Medical History:  Diagnosis Date  . Baker's cyst    Left calf  . CAD in native artery, 07/15/15 PCI of RCA with DES 07/16/2015   a.   NSTEMI 5/17: LHC - pLAD 20, pLCx 20, OM1 30, pRCA 100, EF 45-50%>> PCI: 2.5 x 24 mm Promus DES to RCA  //  b.   Echo 5/17: mild LVH, EF 50-55%, no RWMA, mod RVE //  c. LHC 6/17: pLAD 20, pLCx 20, OM1 50, pRCA stent ok, EF 35-45% with mod sized inf wall and basal segment aneurysm  . Chronic back pain    "down my back, down my legs" (02/18/2017)  . Constipation   . GERD (gastroesophageal reflux disease)   . Hyperlipidemia   . Hypertension    Dr. [name] [phone]  . Ischemic cardiomyopathy    a. LV-gram at time of LHC in 6/17 with EF 35-45%  //  b. Echo 7/17: EF 45-50%, inferior HK, grade 1 diastolic dysfunction, mildly dilated aortic root, moderately reduced RVSF, mild RAE  . Myocardial infarction ([place]) 2017  . 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 Numbness and tingling of both lower extremities    "on the outside of both sides" (02/18/2017)  . Rheumatoid arthritis (  [place])   . Type II diabetes mellitus ([place])    diet controlled   Past Surgical History:  Procedure Laterality Date  . BACK SURGERY    . CARDIAC CATHETERIZATION N/A 07/15/2015   Procedure: Left Heart Cath and Coronary Angiography;  Surgeon: [name], MD;  Location: [facility];  Service: Cardiovascular;  Laterality: N/A;  . CARDIAC CATHETERIZATION N/A 07/15/2015   Procedure: Coronary Stent Intervention;  Surgeon: [name], MD;  Location: [facility];  Service: Cardiovascular;  Laterality: N/A;  . CARDIAC CATHETERIZATION N/A 08/07/2015   Procedure: Left Heart Cath and Coronary Angiography;  Surgeon: [name], MD;  Location: [facility];  Service: Cardiovascular;  Laterality: N/A;  . CORONARY ANGIOPLASTY WITH STENT PLACEMENT  07/2015  . JOINT REPLACEMENT    . LEFT HEART CATH AND CORONARY ANGIOGRAPHY N/A 02/19/2017   Procedure: LEFT HEART CATH AND CORONARY ANGIOGRAPHY;  Surgeon: Martinique, Peter M, MD;  Location: [facility];  Service: Cardiovascular;  Laterality: N/A;  . LUMBAR SPINE SURGERY  03/2009  & 2012  . MASS EXCISION N/A 04/19/2012   Procedure: removal of posterior cervical lipoma;  Surgeon: [name], MD;  Location: [facility];  Service: Neurosurgery;  Laterality: N/A;  Removal of posterior cervical lipoma  . POPLITEAL SYNOVIAL CYST EXCISION Left    "opened up behind my knee; scraped out arthritis"  . SPINAL CORD STIMULATOR INSERTION N/A 01/07/2013   Procedure:  SPINAL CORD STIMULATOR INSERTION;  Surgeon: [name], MD;  Location: [facility];  Service: Neurosurgery;  Laterality: N/A;  . TONSILLECTOMY    . TOTAL HIP ARTHROPLASTY Right 10/03/2016   Procedure: TOTAL HIP ARTHROPLASTY;  Surgeon: [name], MD;  Location: [facility];  Service: Orthopedics;  Laterality: Right;  . WISDOM TOOTH EXTRACTION     hx of   Social History:  reports that he has quit smoking. His smoking use included cigarettes. He has a 10.00 pack-year smoking history. He has never used smokeless tobacco. He reports that he does not drink alcohol or use drugs.  Allergies  Allergen Reactions  . Brilinta [Ticagrelor] Shortness Of Breath and Other (See Comments)    CHEST PAIN   . Methylprednisolone Other (See Comments)     DIAPHORESIS, HYPOTENSION  . Prednisone Other (See Comments)    DIAPHORESIS, HYPOTENSION  . Toradol [Ketorolac Tromethamine] Other (See Comments)    DIAPHORESIS, HYPOTENSION    . Adhesive [Tape] Rash and Other (See Comments)    "blisters" ( NO SURGICAL TAPE, PLEASE)   Family History  Problem Relation Age of Onset  . Heart disease Mother   . Prostate cancer Brother     Prior to Admission medications   Medication Sig Start Date End Date Taking? Authorizing Provider  aspirin EC 81 MG tablet Take 1 tablet (81 mg total) by mouth daily. 07/18/15  Yes Burns, Alexa R, MD  atorvastatin (LIPITOR) 80 MG tablet TAKE 1 TABLET BY MOUTH EVERY DAY AT 6PM Patient taking differently: TAKE 1 TABLET (80mg ) BY MOUTH EVERY DAY AT BEDTIME 08/13/16  Yes [name], MD  chlorthalidone (HYGROTON) 25 MG tablet Take 1 tablet (25 mg total) by mouth daily. 05/27/16  Yes Burtis Junes, NP  cyclobenzaprine (FLEXERIL) 10 MG tablet Take 10 mg by mouth daily as needed for muscle spasms.   Yes [provider]  diazepam (VALIUM) 10 MG tablet Take 10 mg by mouth 2 (two) times daily.    Yes [provider]  DILT-XR 180 MG 24 hr capsule Take 180 mg by mouth once daily 04/09/16  Yes [provider]  DULoxetine (CYMBALTA) 60 MG capsule Take 60 mg by mouth daily.   Yes [provider]  lisinopril (PRINIVIL,ZESTRIL) 20 MG tablet Take 20 mg by mouth at bedtime.  07/07/16  Yes [provider]  metFORMIN (GLUCOPHAGE-XR) 750 MG 24 hr tablet Take 750 mg by mouth at bedtime.  06/07/15  Yes [provider]  Multiple Vitamin (MULTIVITAMIN WITH MINERALS) TABS tablet Take 1 tablet by mouth daily.   Yes [provider]  omeprazole (PRILOSEC) 40 MG capsule Take 40 mg by mouth daily.   Yes [provider]  Oxycodone HCl 10 MG TABS Take 1 tablet (10 mg total) by mouth every 4 (four) hours as needed (pain). scheduled 10/03/16  Yes Chadwell, Vonna Kotyk, PA-C  polyethylene glycol  powder (GLYCOLAX/MIRALAX) powder Take 17 g by mouth daily as needed for moderate constipation. Mix in 8 oz water, juice, soda, coffee or tea and drink 06/07/15  Yes [provider]  potassium chloride SA (K-DUR,KLOR-CON) 20 MEQ tablet Take 1 tablet (20 mEq total) by mouth daily. 08/21/16  Yes [name], MD  pregabalin (LYRICA) 100 MG capsule Take 100-200 mg by mouth See admin instructions. Take 100 mg in the morning and 200 mg at night   Yes [provider]  traMADol (ULTRAM) 50 MG tablet Take 50 mg by mouth 3 (three) times daily. scheduled   Yes [provider]  nitroGLYCERIN (NITROSTAT) 0.4 MG SL tablet PLACE 1 TABLET UNDER THE TONGUE EVERY 5 MINUTES AS NEEDED FOR CHEST PAIN. CALL 911 AT THIRD DOSE WITHIN 15 MINUTES Patient taking differently: PLACE 1 TABLET (0.4mg ) UNDER THE TONGUE EVERY 5 MINUTES AS NEEDED FOR CHEST PAIN. CALL 911 AT THIRD DOSE WITHIN 15 MINUTES 08/22/16   [name], MD  triamcinolone ointment (KENALOG) 0.5 % Apply 1 application topically 2 (two) times daily. Patient not taking: Reported on 05/25/2017 09/12/16   Margarita Mail, PA-C   Physical Exam: Blood pressure 104/76, pulse 75, temperature 98 F (36.7 C), temperature source Oral, resp. rate 18, height 5\' 11"  (1.803 m), weight 116.6 kg (257 lb), SpO2 92 %. Vitals:   05/30/17 0853 05/30/17 1133  BP: 118/74 104/76  Pulse: 87 75  Resp: 18 18  Temp: (!) 97.5 F (36.4 C) 98 F (36.7 C)  SpO2: 94% 92%     General:  Well-nourished lying in bed calm in no acute distress  Eyes: pupils equal round reactive to light EOMI no scleral icterus  MWN:UUVOZ clear nose without drainage oropharynx without erythema or exudate  Neck: supple no JVD full range of motion no lymphadenopathy  Cardiovascular: RRR heart sounds slightly distant I hear no murmur gallop or rub trace lower extremity edema  Respiratory: normal effort breath sounds slightly distant. I hear no wheezing no crackles no  rhonchi.  Abdomen: obese soft positive bowel sounds but somewhat sluggish no guarding or rebounding  Skin: : Dry. no rashes or lesions. Surgical site clean and dry  Musculoskeletal: joints without swelling/erythema  Psychiatric: , cooperative appropriate  Neurologic: alert and oriented 3 speech clear facial symmetry moving all extremities spontaneously  Labs on Admission:  Basic Metabolic Panel: Recent Labs  Lab 05/27/17 0823 05/28/17 0526 05/29/17 1903  NA 136 138 136  K 3.6 4.0 3.6  CL 97* 101 99*  CO2 28 29 27   GLUCOSE 115* 121* 159*  BUN 14 14 13  CREATININE 1.07 1.07 1.14  CALCIUM 9.2 8.5* 8.5*   Liver Function Tests: No results for input(s): AST, ALT, ALKPHOS, BILITOT, PROT, ALBUMIN in the last 168 hours. No results for input(s): LIPASE, AMYLASE in the last 168 hours. No results for input(s): AMMONIA in the last 168 hours. CBC: Recent Labs  Lab 05/27/17 0823 05/28/17 0526 05/29/17 1903  WBC 7.5 12.5* 13.9*  NEUTROABS  --   --  9.2*  HGB 11.7* 10.1* 9.7*  HCT 37.0* 31.3* 30.9*  MCV 83.3 83.2 82.6  PLT 294 275 233   Cardiac Enzymes: No results for input(s): CKTOTAL, CKMB, CKMBINDEX, TROPONINI in the last 168 hours. BNP: Invalid input(s): POCBNP CBG: Recent Labs  Lab 05/29/17 1202 05/29/17 1558 05/29/17 2119 05/30/17 0742 05/30/17 1128  GLUCAP 95 133* 194* 152* 140*    Radiological Exams on Admission: Dg Chest Port 1 View  Result Date: 05/29/2017 CLINICAL DATA:  62 y/o  M; fever. EXAM: PORTABLE CHEST 1 VIEW COMPARISON:  09/22/2016 chest radiograph. FINDINGS: Patchy consolidation in the lower lobes. No pleural effusion or pneumothorax. Stable normal cardiac silhouette given projection and technique. Partially visualize thoracic spinal stimulator. Bones are unremarkable. IMPRESSION: Patchy consolidations in the lower lobes compatible with pneumonia. Electronically Signed   By: Kristine Garbe M.D.   On: 05/29/2017 22:37      Time spent:  5minutes  Tenea Sens M [facility]   If 7PM-7AM, please contact night-coverage [email or web address] Password [email or web address] 05/30/2017, 11:40 AM

## 2017-05-30 NOTE — Care Management Note (Signed)
Case Management Note  Patient Details  Name: Thomas Mcclure MRN: [id number] Date of Birth: Dec 28, 1955  Subjective/Objective:       Pt presented for lumbar surgery and now has pneumonia.  Pt from home with wife.  Pt given RW and 3n1 on 3C from their stock.  Pt states he is fine with that walker and does not need a wider walker.  Pt and wife request shower stool.        Pt has used [facility] in the past and would like to use [facility] again if needed.         Action/Plan: Requested a shower stool (covered by Medicaid) from [facility].  Will be delivered to room.    Expected Discharge Date:  05/30/17               Expected Discharge Plan:  [place]  In-House Referral:  NA  Discharge planning Services  CM Consult  Post Acute Care Choice:  Durable Medical Equipment Choice offered to:  Patient, Spouse  DME Arranged:  3-N-1, Walker rolling, Shower stool DME Agency:  Industry:    [place] Agency:     Status of Service:  In process, will continue to follow  If discussed at Long Length of Stay Meetings, dates discussed:    Additional Comments:  Claudie Leach, RN 05/30/2017, 3:58 PM

## 2017-05-30 NOTE — Progress Notes (Signed)
Pt had a PCXR done due to fever, Results called to on call person Margo Aye, no new orders at this time, will continue to monitot. [place], Jansel Vonstein Efe

## 2017-05-31 DIAGNOSIS — M5416 Radiculopathy, lumbar region: Secondary | ICD-10-CM | POA: Diagnosis present

## 2017-05-31 DIAGNOSIS — I10 Essential (primary) hypertension: Secondary | ICD-10-CM

## 2017-05-31 DIAGNOSIS — I2510 Atherosclerotic heart disease of native coronary artery without angina pectoris: Secondary | ICD-10-CM

## 2017-05-31 DIAGNOSIS — D72829 Elevated white blood cell count, unspecified: Secondary | ICD-10-CM

## 2017-05-31 DIAGNOSIS — E118 Type 2 diabetes mellitus with unspecified complications: Secondary | ICD-10-CM

## 2017-05-31 DIAGNOSIS — K219 Gastro-esophageal reflux disease without esophagitis: Secondary | ICD-10-CM

## 2017-05-31 LAB — CBC WITH DIFFERENTIAL/PLATELET
Basophils Absolute: 0 10*3/uL (ref 0.0–0.1)
Basophils Relative: 0 %
Eosinophils Absolute: 0.1 10*3/uL (ref 0.0–0.7)
Eosinophils Relative: 1 %
HCT: 29 % — ABNORMAL LOW (ref 39.0–52.0)
Hemoglobin: 8.9 g/dL — ABNORMAL LOW (ref 13.0–17.0)
Lymphocytes Relative: 16 %
Lymphs Abs: 2.1 10*3/uL (ref 0.7–4.0)
MCH: 25.4 pg — ABNORMAL LOW (ref 26.0–34.0)
MCHC: 30.7 g/dL (ref 30.0–36.0)
MCV: 82.9 fL (ref 78.0–100.0)
Monocytes Absolute: 0.9 10*3/uL (ref 0.1–1.0)
Monocytes Relative: 7 %
Neutro Abs: 10 10*3/uL — ABNORMAL HIGH (ref 1.7–7.7)
Neutrophils Relative %: 76 %
Platelets: 257 10*3/uL (ref 150–400)
RBC: 3.5 MIL/uL — ABNORMAL LOW (ref 4.22–5.81)
RDW: 17.3 % — ABNORMAL HIGH (ref 11.5–15.5)
WBC: 13.2 10*3/uL — ABNORMAL HIGH (ref 4.0–10.5)

## 2017-05-31 LAB — GLUCOSE, CAPILLARY
Glucose-Capillary: 204 mg/dL — ABNORMAL HIGH (ref 65–99)
Glucose-Capillary: 166 mg/dL — ABNORMAL HIGH (ref 65–99)
Glucose-Capillary: 169 mg/dL — ABNORMAL HIGH (ref 65–99)

## 2017-05-31 LAB — BASIC METABOLIC PANEL
Anion gap: 10 (ref 5–15)
BUN: 15 mg/dL (ref 6–20)
CO2: 28 mmol/L (ref 22–32)
Calcium: 8.3 mg/dL — ABNORMAL LOW (ref 8.9–10.3)
Chloride: 98 mmol/L — ABNORMAL LOW (ref 101–111)
Creatinine, Ser: 1 mg/dL (ref 0.61–1.24)
GFR calc Af Amer: >60 mL/min (ref >60)
GFR calc non Af Amer: >60 mL/min (ref >60)
Glucose, Bld: 167 mg/dL — ABNORMAL HIGH (ref 65–99)
Potassium: 3.5 mmol/L (ref 3.5–5.1)
Sodium: 136 mmol/L (ref 135–145)

## 2017-05-31 LAB — MAGNESIUM: Magnesium: 2.3 mg/dL (ref 1.7–2.4)

## 2017-05-31 LAB — HIV ANTIBODY (ROUTINE TESTING W REFLEX): HIV Screen 4th Generation wRfx: NONREACTIVE

## 2017-05-31 MED ORDER — CYCLOBENZAPRINE HCL 10 MG PO TABS: 10.0000 mg | ORAL_TABLET | Freq: Three times a day (TID) | ORAL | 0 refills | Status: DC | PRN

## 2017-05-31 MED ORDER — SENNOSIDES-DOCUSATE SODIUM 8.6-50 MG PO TABS: 1.0000 | ORAL_TABLET | Freq: Two times a day (BID) | ORAL | Status: DC

## 2017-05-31 MED ORDER — CEFPODOXIME PROXETIL 200 MG PO TABS: 200.0000 mg | ORAL_TABLET | Freq: Two times a day (BID) | ORAL | 0 refills | Status: AC

## 2017-05-31 MED ORDER — POLYETHYLENE GLYCOL 3350 17 GM/SCOOP PO POWD
17.0000 g | Freq: Every day | ORAL | 0 refills | Status: DC
Start: 2017-05-31 — End: 2017-11-06

## 2017-05-31 NOTE — Discharge Summary (Signed)
Physician Discharge Summary  Patient ID: Thomas Mcclure MRN: [id number] DOB/AGE: Aug 18, 1955 62 y.o.  Admit date: 05/27/2017 Discharge date: 05/31/2017  Admission Diagnoses:pseuodarthrosis l2-3   Discharge Diagnoses: same   Discharged Condition: good Hospital Course: The patient was admitted on 05/27/2017 and taken to the operating room where the patient underwent  plif The patient tolerated the procedure well and was taken to the recovery room and then to thefloor in stable condition. The hospital course was routine. There were no complications. The wound remained clean dry and intact. Pt had appropriate back soreness. No complaints of leg pain or new N/T/W. The patient remained afebrile with stable vital signs, and tolerated a regular diet. The patient continued to increase activities, and pain was well controlled with oral pain medications.   Consults: ID for HCAP  Significant Diagnostic Studies:  Results for orders placed or performed during the hospital encounter of 40/98/11  Basic metabolic panel  Result Value Ref Range   Sodium 136 135 - 145 mmol/L   Potassium 3.6 3.5 - 5.1 mmol/L   Chloride 97 (L) 101 - 111 mmol/L   CO2 28 22 - 32 mmol/L   Glucose, Bld 115 (H) 65 - 99 mg/dL   BUN 14 6 - 20 mg/dL   Creatinine, Ser 1.07 0.61 - 1.24 mg/dL   Calcium 9.2 8.9 - 10.3 mg/dL   GFR calc non Af Amer >60 >60 mL/min   GFR calc Af Amer >60 >60 mL/min   Anion gap 11 5 - 15  CBC  Result Value Ref Range   WBC 7.5 4.0 - 10.5 K/uL   RBC 4.44 4.22 - 5.81 MIL/uL   Hemoglobin 11.7 (L) 13.0 - 17.0 g/dL   HCT 37.0 (L) 39.0 - 52.0 %   MCV 83.3 78.0 - 100.0 fL   MCH 26.4 26.0 - 34.0 pg   MCHC 31.6 30.0 - 36.0 g/dL   RDW 16.9 (H) 11.5 - 15.5 %   Platelets 294 150 - 400 K/uL  Glucose, capillary  Result Value Ref Range   Glucose-Capillary 117 (H) 65 - 99 mg/dL   Comment 1 Notify RN    Comment 2 Document in Chart   Glucose, capillary  Result Value Ref Range   Glucose-Capillary 97 65 - 99 mg/dL    Comment 1 Notify RN    Comment 2 Document in Chart   Glucose, capillary  Result Value Ref Range   Glucose-Capillary 172 (H) 65 - 99 mg/dL   Comment 1 Notify RN    Comment 2 Document in Chart   CBC  Result Value Ref Range   WBC 12.5 (H) 4.0 - 10.5 K/uL   RBC 3.76 (L) 4.22 - 5.81 MIL/uL   Hemoglobin 10.1 (L) 13.0 - 17.0 g/dL   HCT 31.3 (L) 39.0 - 52.0 %   MCV 83.2 78.0 - 100.0 fL   MCH 26.9 26.0 - 34.0 pg   MCHC 32.3 30.0 - 36.0 g/dL   RDW 17.4 (H) 11.5 - 15.5 %   Platelets 275 150 - 400 K/uL  Basic Metabolic Panel  Result Value Ref Range   Sodium 138 135 - 145 mmol/L   Potassium 4.0 3.5 - 5.1 mmol/L   Chloride 101 101 - 111 mmol/L   CO2 29 22 - 32 mmol/L   Glucose, Bld 121 (H) 65 - 99 mg/dL   BUN 14 6 - 20 mg/dL   Creatinine, Ser 1.07 0.61 - 1.24 mg/dL   Calcium 8.5 (L) 8.9 - 10.3 mg/dL  GFR calc non Af Amer >60 >60 mL/min   GFR calc Af Amer >60 >60 mL/min   Anion gap 8 5 - 15  Hemoglobin A1c  Result Value Ref Range   Hgb A1c MFr Bld 7.3 (H) 4.8 - 5.6 %   Mean Plasma Glucose 162.81 mg/dL  Glucose, capillary  Result Value Ref Range   Glucose-Capillary 276 (H) 65 - 99 mg/dL   Comment 1 Notify RN    Comment 2 Document in Chart   Glucose, capillary  Result Value Ref Range   Glucose-Capillary 126 (H) 65 - 99 mg/dL   Comment 1 Notify RN    Comment 2 Document in Chart   Glucose, capillary  Result Value Ref Range   Glucose-Capillary 152 (H) 65 - 99 mg/dL  Glucose, capillary  Result Value Ref Range   Glucose-Capillary 188 (H) 65 - 99 mg/dL  Glucose, capillary  Result Value Ref Range   Glucose-Capillary 337 (H) 65 - 99 mg/dL   Comment 1 Notify RN    Comment 2 Document in Chart   Glucose, capillary  Result Value Ref Range   Glucose-Capillary 163 (H) 65 - 99 mg/dL   Comment 1 Notify RN    Comment 2 Document in Chart   Glucose, capillary  Result Value Ref Range   Glucose-Capillary 95 65 - 99 mg/dL  Glucose, capillary  Result Value Ref Range   Glucose-Capillary  133 (H) 65 - 99 mg/dL  CBC with Differential/Platelet  Result Value Ref Range   WBC 13.9 (H) 4.0 - 10.5 K/uL   RBC 3.74 (L) 4.22 - 5.81 MIL/uL   Hemoglobin 9.7 (L) 13.0 - 17.0 g/dL   HCT 30.9 (L) 39.0 - 52.0 %   MCV 82.6 78.0 - 100.0 fL   MCH 25.9 (L) 26.0 - 34.0 pg   MCHC 31.4 30.0 - 36.0 g/dL   RDW 17.3 (H) 11.5 - 15.5 %   Platelets 233 150 - 400 K/uL   Neutrophils Relative % 67 %   Neutro Abs 9.2 (H) 1.7 - 7.7 K/uL   Lymphocytes Relative 21 %   Lymphs Abs 2.9 0.7 - 4.0 K/uL   Monocytes Relative 11 %   Monocytes Absolute 1.6 (H) 0.1 - 1.0 K/uL   Eosinophils Relative 1 %   Eosinophils Absolute 0.1 0.0 - 0.7 K/uL   Basophils Relative 0 %   Basophils Absolute 0.0 0.0 - 0.1 K/uL  Basic metabolic panel  Result Value Ref Range   Sodium 136 135 - 145 mmol/L   Potassium 3.6 3.5 - 5.1 mmol/L   Chloride 99 (L) 101 - 111 mmol/L   CO2 27 22 - 32 mmol/L   Glucose, Bld 159 (H) 65 - 99 mg/dL   BUN 13 6 - 20 mg/dL   Creatinine, Ser 1.14 0.61 - 1.24 mg/dL   Calcium 8.5 (L) 8.9 - 10.3 mg/dL   GFR calc non Af Amer >60 >60 mL/min   GFR calc Af Amer >60 >60 mL/min   Anion gap 10 5 - 15  Glucose, capillary  Result Value Ref Range   Glucose-Capillary 194 (H) 65 - 99 mg/dL   Comment 1 Notify RN    Comment 2 Document in Chart   Glucose, capillary  Result Value Ref Range   Glucose-Capillary 152 (H) 65 - 99 mg/dL   Comment 1 Notify RN    Comment 2 Document in Chart   HIV antibody (Routine Screening)  Result Value Ref Range   HIV Screen 4th Generation wRfx Non  Reactive Non Reactive  Strep pneumoniae urinary antigen  Result Value Ref Range   Strep Pneumo Urinary Antigen NEGATIVE NEGATIVE  CBC  Result Value Ref Range   WBC 14.1 (H) 4.0 - 10.5 K/uL   RBC 3.87 (L) 4.22 - 5.81 MIL/uL   Hemoglobin 10.6 (L) 13.0 - 17.0 g/dL   HCT 32.1 (L) 39.0 - 52.0 %   MCV 82.9 78.0 - 100.0 fL   MCH 27.4 26.0 - 34.0 pg   MCHC 33.0 30.0 - 36.0 g/dL   RDW 17.4 (H) 11.5 - 15.5 %   Platelets 153 150 - 400  K/uL  Basic metabolic panel  Result Value Ref Range   Sodium 134 (L) 135 - 145 mmol/L   Potassium 4.7 3.5 - 5.1 mmol/L   Chloride 97 (L) 101 - 111 mmol/L   CO2 22 22 - 32 mmol/L   Glucose, Bld 181 (H) 65 - 99 mg/dL   BUN 17 6 - 20 mg/dL   Creatinine, Ser 0.99 0.61 - 1.24 mg/dL   Calcium 8.7 (L) 8.9 - 10.3 mg/dL   GFR calc non Af Amer >60 >60 mL/min   GFR calc Af Amer >60 >60 mL/min   Anion gap 15 5 - 15  Glucose, capillary  Result Value Ref Range   Glucose-Capillary 140 (H) 65 - 99 mg/dL   Comment 1 Notify RN    Comment 2 Document in Chart   Urinalysis, Routine w reflex microscopic  Result Value Ref Range   Color, Urine YELLOW YELLOW   APPearance CLEAR CLEAR   Specific Gravity, Urine 1.011 1.005 - 1.030   pH 6.0 5.0 - 8.0   Glucose, UA NEGATIVE NEGATIVE mg/dL   Hgb urine dipstick NEGATIVE NEGATIVE   Bilirubin Urine NEGATIVE NEGATIVE   Ketones, ur NEGATIVE NEGATIVE mg/dL   Protein, ur NEGATIVE NEGATIVE mg/dL   Nitrite NEGATIVE NEGATIVE   Leukocytes, UA NEGATIVE NEGATIVE  Glucose, capillary  Result Value Ref Range   Glucose-Capillary 197 (H) 65 - 99 mg/dL   Comment 1 Notify RN    Comment 2 Document in Chart   Glucose, capillary  Result Value Ref Range   Glucose-Capillary 206 (H) 65 - 99 mg/dL  Glucose, capillary  Result Value Ref Range   Glucose-Capillary 204 (H) 65 - 99 mg/dL   Comment 1 Notify RN    Comment 2 Document in Chart   Glucose, capillary  Result Value Ref Range   Glucose-Capillary 166 (H) 65 - 99 mg/dL   Comment 1 Notify RN    Comment 2 Document in Chart   Basic metabolic panel  Result Value Ref Range   Sodium 136 135 - 145 mmol/L   Potassium 3.5 3.5 - 5.1 mmol/L   Chloride 98 (L) 101 - 111 mmol/L   CO2 28 22 - 32 mmol/L   Glucose, Bld 167 (H) 65 - 99 mg/dL   BUN 15 6 - 20 mg/dL   Creatinine, Ser 1.00 0.61 - 1.24 mg/dL   Calcium 8.3 (L) 8.9 - 10.3 mg/dL   GFR calc non Af Amer >60 >60 mL/min   GFR calc Af Amer >60 >60 mL/min   Anion gap 10 5 - 15   CBC with Differential/Platelet  Result Value Ref Range   WBC 13.2 (H) 4.0 - 10.5 K/uL   RBC 3.50 (L) 4.22 - 5.81 MIL/uL   Hemoglobin 8.9 (L) 13.0 - 17.0 g/dL   HCT 29.0 (L) 39.0 - 52.0 %   MCV 82.9 78.0 - 100.0 fL  MCH 25.4 (L) 26.0 - 34.0 pg   MCHC 30.7 30.0 - 36.0 g/dL   RDW 17.3 (H) 11.5 - 15.5 %   Platelets 257 150 - 400 K/uL   Neutrophils Relative % 76 %   Neutro Abs 10.0 (H) 1.7 - 7.7 K/uL   Lymphocytes Relative 16 %   Lymphs Abs 2.1 0.7 - 4.0 K/uL   Monocytes Relative 7 %   Monocytes Absolute 0.9 0.1 - 1.0 K/uL   Eosinophils Relative 1 %   Eosinophils Absolute 0.1 0.0 - 0.7 K/uL   Basophils Relative 0 %   Basophils Absolute 0.0 0.0 - 0.1 K/uL  Magnesium  Result Value Ref Range   Magnesium 2.3 1.7 - 2.4 mg/dL  Glucose, capillary  Result Value Ref Range   Glucose-Capillary 169 (H) 65 - 99 mg/dL   Comment 1 Notify RN    Comment 2 Document in Chart   Type and screen  Result Value Ref Range   ABO/RH(D) B POS    Antibody Screen NEG    Sample Expiration      05/30/2017 Performed at [facility], [address] [place], [place] [address]     Dg Lumbar Spine Complete  Result Date: 05/27/2017 CLINICAL DATA:  Posterior fusion at L2-3 EXAM: DG C-ARM 61-120 MIN; LUMBAR SPINE - COMPLETE 4+ VIEW COMPARISON:  03/17/2017 FLUOROSCOPY TIME:  Fluoroscopy Time:  56 seconds Radiation Exposure Index (if provided by the fluoroscopic device): Not available Number of Acquired Spot Images: 5 FINDINGS: Pedicle screws are noted at L2 with interbody fusion at L2-3. Changes of prior fusion at L3-4, L4-5 and L5-S1 are seen. S2 screws are noted extending into the ileum new from the prior exam. IMPRESSION: Postsurgical changes at L2-3 and also inferiorly extending into the sacrum. Posterior fusion elements are not present. Electronically Signed   By: Inez Catalina M.D.   On: 05/27/2017 15:13   Dg Chest Port 1 View  Result Date: 05/29/2017 CLINICAL DATA:  62 y/o  M; fever. EXAM: PORTABLE  CHEST 1 VIEW COMPARISON:  09/22/2016 chest radiograph. FINDINGS: Patchy consolidation in the lower lobes. No pleural effusion or pneumothorax. Stable normal cardiac silhouette given projection and technique. Partially visualize thoracic spinal stimulator. Bones are unremarkable. IMPRESSION: Patchy consolidations in the lower lobes compatible with pneumonia. Electronically Signed   By: Kristine Garbe M.D.   On: 05/29/2017 22:37   Dg C-arm 1-60 Min  Result Date: 05/27/2017 CLINICAL DATA:  Posterior fusion at L2-3 EXAM: DG C-ARM 61-120 MIN; LUMBAR SPINE - COMPLETE 4+ VIEW COMPARISON:  03/17/2017 FLUOROSCOPY TIME:  Fluoroscopy Time:  56 seconds Radiation Exposure Index (if provided by the fluoroscopic device): Not available Number of Acquired Spot Images: 5 FINDINGS: Pedicle screws are noted at L2 with interbody fusion at L2-3. Changes of prior fusion at L3-4, L4-5 and L5-S1 are seen. S2 screws are noted extending into the ileum new from the prior exam. IMPRESSION: Postsurgical changes at L2-3 and also inferiorly extending into the sacrum. Posterior fusion elements are not present. Electronically Signed   By: Inez Catalina M.D.   On: 05/27/2017 15:13   Dg C-arm 1-60 Min  Result Date: 05/27/2017 CLINICAL DATA:  Posterior fusion at L2-3 EXAM: DG C-ARM 61-120 MIN; LUMBAR SPINE - COMPLETE 4+ VIEW COMPARISON:  03/17/2017 FLUOROSCOPY TIME:  Fluoroscopy Time:  56 seconds Radiation Exposure Index (if provided by the fluoroscopic device): Not available Number of Acquired Spot Images: 5 FINDINGS: Pedicle screws are noted at L2 with interbody fusion at L2-3.  Changes of prior fusion at L3-4, L4-5 and L5-S1 are seen. S2 screws are noted extending into the ileum new from the prior exam. IMPRESSION: Postsurgical changes at L2-3 and also inferiorly extending into the sacrum. Posterior fusion elements are not present. Electronically Signed   By: Inez Catalina M.D.   On: 05/27/2017 15:13    Antibiotics:  Anti-infectives  (From admission, onward)   Start     Dose/Rate Route Frequency Ordered Stop   06/17/17 0600  ceFAZolin (ANCEF) IVPB 2g/100 mL premix  Status:  Discontinued     2 g 200 mL/hr over 30 Minutes Intravenous On call to O.R. 05/27/17 0757 05/27/17 0801   05/31/17 0200  vancomycin (VANCOCIN) IVPB 750 mg/150 ml premix     750 mg 150 mL/hr over 60 Minutes Intravenous Every 12 hours 05/30/17 1224     05/31/17 0000  cefpodoxime (VANTIN) 200 MG tablet     200 mg Oral 2 times daily 05/31/17 1031 06/06/17 2359   05/30/17 1400  ceFEPIme (MAXIPIME) 1 g in sodium chloride 0.9 % 100 mL IVPB     1 g 200 mL/hr over 30 Minutes Intravenous Every 8 hours 05/30/17 1110 06/07/17 1359   05/30/17 1400  vancomycin (VANCOCIN) 2,500 mg in sodium chloride 0.9 % 500 mL IVPB     2,500 mg 250 mL/hr over 120 Minutes Intravenous  Once 05/30/17 1224 05/30/17 1722   05/27/17 2200  ceFAZolin (ANCEF) IVPB 2g/100 mL premix     2 g 200 mL/hr over 30 Minutes Intravenous Every 8 hours 05/27/17 1859 05/27/17 2020   05/27/17 1621  vancomycin (VANCOCIN) powder  Status:  Discontinued       As needed 05/27/17 1622 05/27/17 1653   05/27/17 1149  bacitracin 50,000 Units in sodium chloride irrigation 0.9 % 500 mL irrigation  Status:  Discontinued       As needed 05/27/17 1149 05/27/17 1653   05/27/17 0815  ceFAZolin (ANCEF) IVPB 2g/100 mL premix     2 g 200 mL/hr over 30 Minutes Intravenous  Once 05/27/17 0801 05/27/17 1441   05/27/17 0758  ceFAZolin (ANCEF) 2-4 GM/100ML-% IVPB    Note to Pharmacy:  Tamsen Snider   : cabinet override      05/27/17 0758 05/27/17 1013      Discharge Exam: Blood pressure 108/71, pulse 97, temperature 98 F (36.7 C), temperature source Oral, resp. rate 18, height 5\' 11"  (1.803 m), weight 116.6 kg (257 lb), SpO2 97 %. Neuro intact Ambulating and voiding well  Discharge Medications:   Allergies as of 05/31/2017      Reactions   Brilinta [ticagrelor] Shortness Of Breath, Other (See Comments)   CHEST  PAIN    Methylprednisolone Other (See Comments)   DIAPHORESIS, HYPOTENSION   Prednisone Other (See Comments)   DIAPHORESIS, HYPOTENSION   Toradol [ketorolac Tromethamine] Other (See Comments)   DIAPHORESIS, HYPOTENSION   Adhesive [tape] Rash, Other (See Comments)   "blisters" ( NO SURGICAL TAPE, PLEASE)      Medication List    STOP taking these medications   triamcinolone ointment 0.5 % Commonly known as:  KENALOG     TAKE these medications   aspirin EC 81 MG tablet Take 1 tablet (81 mg total) by mouth daily.   atorvastatin 80 MG tablet Commonly known as:  LIPITOR TAKE 1 TABLET BY MOUTH EVERY DAY AT 6PM What changed:  See the new instructions.   cefpodoxime 200 MG tablet Commonly known as:  VANTIN Take 1 tablet (200 mg  total) by mouth 2 (two) times daily for 6 days.   chlorthalidone 25 MG tablet Commonly known as:  HYGROTON Take 1 tablet (25 mg total) by mouth daily.   cyclobenzaprine 10 MG tablet Commonly known as:  FLEXERIL Take 10 mg by mouth daily as needed for muscle spasms. What changed:  Another medication with the same name was added. Make sure you understand how and when to take each.   cyclobenzaprine 10 MG tablet Commonly known as:  FLEXERIL Take 1 tablet (10 mg total) by mouth 3 (three) times daily as needed for muscle spasms. What changed:  You were already taking a medication with the same name, and this prescription was added. Make sure you understand how and when to take each.   diazepam 10 MG tablet Commonly known as:  VALIUM Take 10 mg by mouth 2 (two) times daily.   DILT-XR 180 MG 24 hr capsule Generic drug:  diltiazem Take 180 mg by mouth once daily   DULoxetine 60 MG capsule Commonly known as:  CYMBALTA Take 60 mg by mouth daily.   lisinopril 20 MG tablet Commonly known as:  PRINIVIL,ZESTRIL Take 20 mg by mouth at bedtime.   metFORMIN 750 MG 24 hr tablet Commonly known as:  GLUCOPHAGE-XR Take 750 mg by mouth at bedtime.    multivitamin with minerals Tabs tablet Take 1 tablet by mouth daily.   nitroGLYCERIN 0.4 MG SL tablet Commonly known as:  NITROSTAT PLACE 1 TABLET UNDER THE TONGUE EVERY 5 MINUTES AS NEEDED FOR CHEST PAIN. CALL 911 AT THIRD DOSE WITHIN 15 MINUTES What changed:  See the new instructions.   omeprazole 40 MG capsule Commonly known as:  PRILOSEC Take 40 mg by mouth daily.   Oxycodone HCl 10 MG Tabs Take 1 tablet (10 mg total) by mouth every 4 (four) hours as needed (pain). scheduled   polyethylene glycol powder powder Commonly known as:  GLYCOLAX/MIRALAX Take 17 g by mouth daily. Mix in 8 oz water, juice, soda, coffee or tea and drink What changed:    when to take this  reasons to take this   potassium chloride SA 20 MEQ tablet Commonly known as:  K-DUR,KLOR-CON Take 1 tablet (20 mEq total) by mouth daily.   pregabalin 100 MG capsule Commonly known as:  LYRICA Take 100-200 mg by mouth See admin instructions. Take 100 mg in the morning and 200 mg at night   senna-docusate 8.6-50 MG tablet Commonly known as:  SENOKOT S Take 1 tablet by mouth 2 (two) times daily.   traMADol 50 MG tablet Commonly known as:  ULTRAM Take 50 mg by mouth 3 (three) times daily. scheduled            Durable Medical Equipment  (From admission, onward)        Start     Ordered   05/30/17 1123  For home use only DME Shower stool  Once     05/30/17 1123   05/28/17 1359  For home use only DME 3 n 1  Once     05/28/17 1358   05/28/17 1359  For home use only DME Walker  Once    Question:  Patient needs a walker to treat with the following condition  Answer:  S/P lumbar spinal fusion   05/28/17 1358      Disposition: home  Final Dx: same  Discharge Instructions     Remove dressing in 72 hours   Complete by:  As directed    Call  MD for:  difficulty breathing, headache or visual disturbances   Complete by:  As directed    Call MD for:  extreme fatigue   Complete by:  As directed     Call MD for:  hives   Complete by:  As directed    Call MD for:  persistant dizziness or light-headedness   Complete by:  As directed    Call MD for:  persistant nausea and vomiting   Complete by:  As directed    Call MD for:  redness, tenderness, or signs of infection (pain, swelling, redness, odor or green/yellow discharge around incision site)   Complete by:  As directed    Call MD for:  severe uncontrolled pain   Complete by:  As directed    Call MD for:  temperature >100.4   Complete by:  As directed    Diet - low sodium heart healthy   Complete by:  As directed    Increase activity slowly   Complete by:  As directed    Lifting restrictions   Complete by:  As directed    Nothing heavier than 8 lbs      Follow-up Information    [name], MD Follow up.   Specialty:  Internal Medicine Why:  Follow-up as scheduled. Contact information: [place] [address] [phone]        [name], MD .   Specialty:  Cardiology Contact information: [phone] [address] [place] [address] [phone]            Signed: Ocie Cornfield [facility] 05/31/2017, 3:25 PM

## 2017-05-31 NOTE — Progress Notes (Signed)
PROGRESS NOTE    Thomas Mcclure  VPX:[id number] DOB: 1955/05/23 DOA: 05/27/2017 PCP: [name], MD   Brief Narrative:  Mr Thomas Mcclure is a 62 yo asked medical history that includes hypertension,edema cardiomyopathy, CAD,iabetes, chronic back pain underwent PLIF L2-3 4 days ago. Was doing well ambulating eating using his incentive spirometry pain was well controlled until yesterday. He states yesterday he felt weak most of the day. Wife states that he was "sweating all day long". The evening of 05/29/2017, he spiked a temperature greater than 102 developed tachycardia had a slight increased oxygen demand. CBC revealed WBCs trending up and chest xray consistent with pneumonia. patient denies any coughing shortness of breath chest pain palpitation. He denied any abdominal pain nausea vomiting. He denies dysuria hematuria frequency or urgency. He does indicate difficulty with bowel movements. He states he feels better today than he did yesterday as his appetite is improved and pain is controlled.     Assessment & Plan:   Principal Problem:   HCAP (healthcare-associated pneumonia) Active Problems:   Hypertension   CAD (coronary artery disease)   GERD (gastroesophageal reflux disease)   Diabetes mellitus type 2, controlled, with complications (HCC)   Lumbar pseudoarthrosis   Leukocytosis   Radiculopathy of lumbar region  #1 healthcare associated pneumonia Patient status post PLIF 05/27/2017.  Patient will using incentive spirometry, working with physical therapy however spiked a temperature as high as 102 with increased O2 demand, leukocytosis and tachycardia.  Chest x-ray done consistent with a pneumonia.  Patient currently afebrile.  Tachycardia has resolved.  Labs pending.  Patient with sats of 98% on room air.  Patient with clinical improvement.  Blood cultures were obtained and are pending.  Sputum Gram stain and culture pending.  Patient placed on IV vancomycin and IV cefepime.  Could transition  to oral Vantin on discharge for 6 more days to complete a one-week course of antibiotic treatment.  Will need outpatient follow-up with PCP.  2.  Gastroesophageal reflux disease Continue PPI.  3.  Hypertension Stable.  Continue current regimen of hydrocodone, Cardizem, lisinopril.  4.  Diabetes mellitus type 2 Hemoglobin A1c 7.3.  CBGs ranging from 166-204.  Continue current sliding scale.  Continue metformin.  Outpatient follow-up with PCP.  5.  Status post PLIF Per primary team.  6.  Coronary artery disease Stable.  Patient denies any chest pain.  Patient denies any shortness of breath.  To new home regimen of ACE inhibitor, Cardizem, Lipitor, [place].  7.  Leukocytosis Likely secondary to problem #1.  Urinalysis unremarkable.  Blood cultures pending.  Patient currently afebrile.  Labs pending.  Continue empiric IV antibiotics.   DVT prophylaxis: Per primary team. Code Status: Full Family Communication: Updated patient.  No family at bedside. Disposition Plan: Okay to be discharge from a internal medicine perspective.   Consultants:   Hospitalists: Dr. Evangeline Gula 05/30/2017  Procedures:   Chest x-ray 05/29/2017  Plain films of the L-spine 05/27/2017  PLIF 05/27/2017 per Dr. [name]  Antimicrobials:   IV cefepime 05/30/2017  IV vancomycin 05/30/2017   Subjective: She states he is feeling great.  Denies any shortness of breath.  Denies any chest pain.  Denies any cough.  She stated ambulated without any significant difficulties.  Patient stated had a small bowel movement this morning.  Patient anxious to be discharged this morning.  Objective: Vitals:   05/30/17 2137 05/31/17 0015 05/31/17 0428 05/31/17 0752  BP: 105/69 112/75 115/81 116/76  Pulse: 87 89 91 91  Resp: 16 18 16 18   Temp: 97.8 F (36.6 C) 97.8 F (36.6 C) 97.8 F (36.6 C) 98.3 F (36.8 C)  TempSrc: Oral Oral Oral Oral  SpO2: 96% 97% 95% 98%  Weight:      Height:        Intake/Output Summary (Last 24  hours) at 05/31/2017 1034 Last data filed at 05/31/2017 0700 Gross per 24 hour  Intake 1250 ml  Output 1780 ml  Net -530 ml   Filed Weights   05/27/17 0812  Weight: 116.6 kg (257 lb)    Examination:  General exam: Appears calm and comfortable  Respiratory system: Some bibasilar coarse breath sounds.  No wheezing.  No crackles.  Respiratory effort normal. Cardiovascular system: S1 & S2 heard, RRR. No JVD, murmurs, rubs, gallops or clicks. No pedal edema. Gastrointestinal system: Abdomen is mildly distended, soft, nontender, positive bowel sounds, obese, no rebound, no guarding.  Central nervous system: Alert and oriented. No focal neurological deficits. Extremities: Symmetric 5 x 5 power. Skin: No rashes, lesions or ulcers Psychiatry: Judgement and insight appear normal. Mood & affect appropriate.     Data Reviewed: I have personally reviewed following labs and imaging studies  CBC: Recent Labs  Lab 05/27/17 0823 05/28/17 0526 05/29/17 1903 05/30/17 1118  WBC 7.5 12.5* 13.9* 14.1*  NEUTROABS  --   --  9.2*  --   HGB 11.7* 10.1* 9.7* 10.6*  HCT 37.0* 31.3* 30.9* 32.1*  MCV 83.3 83.2 82.6 82.9  PLT 294 275 233 341   Basic Metabolic Panel: Recent Labs  Lab 05/27/17 0823 05/28/17 0526 05/29/17 1903 05/30/17 1118  NA 136 138 136 134*  K 3.6 4.0 3.6 4.7  CL 97* 101 99* 97*  CO2 28 29 27 22   GLUCOSE 115* 121* 159* 181*  BUN 14 14 13 17   CREATININE 1.07 1.07 1.14 0.99  CALCIUM 9.2 8.5* 8.5* 8.7*   GFR: Estimated Creatinine Clearance: 101.7 mL/min (by C-G formula based on SCr of 0.99 mg/dL). Liver Function Tests: No results for input(s): AST, ALT, ALKPHOS, BILITOT, PROT, ALBUMIN in the last 168 hours. No results for input(s): LIPASE, AMYLASE in the last 168 hours. No results for input(s): AMMONIA in the last 168 hours. Coagulation Profile: No results for input(s): INR, PROTIME in the last 168 hours. Cardiac Enzymes: No results for input(s): CKTOTAL, CKMB,  CKMBINDEX, TROPONINI in the last 168 hours. BNP (last 3 results) No results for input(s): PROBNP in the last 8760 hours. HbA1C: No results for input(s): HGBA1C in the last 72 hours. CBG: Recent Labs  Lab 05/30/17 1128 05/30/17 1703 05/30/17 2146 05/31/17 0638 05/31/17 0750  GLUCAP 140* 197* 206* 204* 166*   Lipid Profile: No results for input(s): CHOL, HDL, LDLCALC, TRIG, CHOLHDL, LDLDIRECT in the last 72 hours. Thyroid Function Tests: No results for input(s): TSH, T4TOTAL, FREET4, T3FREE, THYROIDAB in the last 72 hours. Anemia Panel: No results for input(s): VITAMINB12, FOLATE, FERRITIN, TIBC, IRON, RETICCTPCT in the last 72 hours. Sepsis Labs: No results for input(s): PROCALCITON, LATICACIDVEN in the last 168 hours.  No results found for this or any previous visit (from the past 240 hour(s)).       Radiology Studies: Dg Chest Port 1 View  Result Date: 05/29/2017 CLINICAL DATA:  62 y/o  M; fever. EXAM: PORTABLE CHEST 1 VIEW COMPARISON:  09/22/2016 chest radiograph. FINDINGS: Patchy consolidation in the lower lobes. No pleural effusion or pneumothorax. Stable normal cardiac silhouette given projection and technique. Partially visualize thoracic spinal stimulator. Bones  are unremarkable. IMPRESSION: Patchy consolidations in the lower lobes compatible with pneumonia. Electronically Signed   By: Kristine Garbe M.D.   On: 05/29/2017 22:37        Scheduled Meds: . atorvastatin  80 mg Oral q1800  . chlorthalidone  25 mg Oral Daily  . diltiazem  180 mg Oral Daily  . docusate sodium  100 mg Oral BID  . DULoxetine  60 mg Oral Daily  . insulin aspart  0-20 Units Subcutaneous TID WC  . insulin aspart  0-5 Units Subcutaneous QHS  . lisinopril  20 mg Oral QHS  . metFORMIN  750 mg Oral Q supper  . pantoprazole  80 mg Oral Daily  . potassium chloride SA  20 mEq Oral Daily  . pregabalin  100 mg Oral Daily   And  . pregabalin  200 mg Oral QHS  . traMADol  50 mg Oral TID    Continuous Infusions: . ceFEPime (MAXIPIME) IV Stopped (05/31/17 0630)  . vancomycin Stopped (05/31/17 0306)     LOS: 4 days    Time spent: 35 minutes    Irine Seal, MD [facility] Pager [phone]  If 7PM-7AM, please contact night-coverage [email or web address] Password [facility] 05/31/2017, 10:34 AM

## 2017-05-31 NOTE — Progress Notes (Signed)
Patient ID: Thomas Mcclure, male   DOB: [birth year], 62 y.o.   MRN: [id number] Patient is eager for discharge. States he is doing well. Appropriate soreness. Dressing is dry. Mobilizing well. Thank you to medicine for excellent care of pneumonia. Await their input regarding discharge.

## 2017-05-31 NOTE — Progress Notes (Signed)
Physical Therapy Treatment Patient Details Name: Thomas Mcclure MRN: [id number] DOB: [birth year] Today's Date: 05/31/2017    History of Present Illness Pt is a 62 y.o. male s/p L2-3 PLIF. PMHx: CAD, Chronic back pain, GERD< Hyperlipidemia, HTN, Ischemic cardiomyopathy, MI, RA, DM2, R THA 2018, Spinal cord stimulator insertion 2014, Lumbar spine sx 2011, 2012.    PT Comments    Pt progressing towards functional mobility goals. Bed mobility and transfers supervision. Pt required max VCs to maintain log roll technique. Ambulation with RW and stair negotiation min guard. Noted poor ST memory and awareness of precautions this session. Pt unable to successfully recall 0/3 back precautions after reviewing precautions verbally and via demonstration 5x throughout session. Provided back precautions handout. Reviewed car transfer and mobility expectations with pt. Current plan remains appropriate. Will continue to follow acutely and progress as tolerated.   Follow Up Recommendations  No PT follow up;Supervision for mobility/OOB     Equipment Recommendations  Rolling walker with 5" wheels    Recommendations for Other Services       Precautions / Restrictions Precautions Precautions: Back;Fall Precaution Booklet Issued: Yes (comment) Precaution Comments: unable to recall back precautions. reviewed precautions with pt Required Braces or Orthoses: Spinal Brace Spinal Brace: Lumbar corset;Applied in sitting position(VCs for proper application) Restrictions Weight Bearing Restrictions: No    Mobility  Bed Mobility Overal bed mobility: Needs Assistance Bed Mobility: Rolling;Supine to Sit Rolling: Supervision Sidelying to sit: Supervision     Sit to sidelying: Supervision General bed mobility comments: HOB flat. use of bed rail to assist even with VCs not to use bed rail. max VCs and demonstration to maintain log roll technique  Transfers Overall transfer level: Needs assistance Equipment  used: Rolling walker (2 wheeled) Transfers: Sit to/from Stand Sit to Stand: Supervision         General transfer comment: supervision for safety. VCs for hand placement. increased time required  Ambulation/Gait Ambulation/Gait assistance: Min guard Ambulation Distance (Feet): 250 Feet Assistive device: Rolling walker (2 wheeled) Gait Pattern/deviations: Step-through pattern;Decreased stride length;Trunk flexed Gait velocity: Decreased Gait velocity interpretation: Below normal speed for age/gender General Gait Details: min guard for safety. VCs for increased speed, upright posture and to maintain close proximity to [employer] Stairs: Yes   Stair Management: One rail Right;Step to pattern;Forwards Number of Stairs: 5 General stair comments: min guard for safety. VCs for step to pattern  Wheelchair Mobility    Modified Rankin (Stroke Patients Only)       Balance Overall balance assessment: Needs assistance Sitting-balance support: Feet supported;No upper extremity supported Sitting balance-Leahy Scale: Good     Standing balance support: Bilateral upper extremity supported Standing balance-Leahy Scale: Poor Standing balance comment: bil UE on RW for support                            Cognition Arousal/Alertness: Awake/alert Behavior During Therapy: WFL for tasks assessed/performed Overall Cognitive Status: Impaired/Different from baseline Area of Impairment: Safety/judgement;Memory                     Memory: Decreased recall of precautions;Decreased short-term memory   Safety/Judgement: Decreased awareness of deficits;Decreased awareness of safety     General Comments: pt agitated when arrived to room, but calmed down as session progressed      Exercises      General Comments        Pertinent Vitals/Pain Pain Assessment: Faces  Faces Pain Scale: Hurts little more Pain Location: back Pain Descriptors / Indicators: Aching Pain  Intervention(s): Monitored during session;Limited activity within patient's tolerance;Repositioned    Home Living                      Prior Function            PT Goals (current goals can now be found in the care plan section) Acute Rehab PT Goals Patient Stated Goal: return home PT Goal Formulation: With patient Time For Goal Achievement: 06/04/17 Potential to Achieve Goals: Good Progress towards PT goals: Progressing toward goals    Frequency    Min 5X/week      PT Plan Current plan remains appropriate    Co-evaluation              AM-PAC PT "6 Clicks" Daily Activity  Outcome Measure  Difficulty turning over in bed (including adjusting bedclothes, sheets and blankets)?: A Little Difficulty moving from lying on back to sitting on the side of the bed? : A Little Difficulty sitting down on and standing up from a chair with arms (e.g., wheelchair, bedside commode, etc,.)?: A Little Help needed moving to and from a bed to chair (including a wheelchair)?: A Little Help needed walking in hospital room?: None Help needed climbing 3-5 steps with a railing? : A Little 6 Click Score: 19    End of Session Equipment Utilized During Treatment: Gait belt;Back brace Activity Tolerance: Patient tolerated treatment well Patient left: in bed;with call bell/phone within reach Nurse Communication: Mobility status PT Visit Diagnosis: Unsteadiness on feet (R26.81);Pain Pain - part of body: (back)     Time: 8315-1761 PT Time Calculation (min) (ACUTE ONLY): 16 min  Charges:  $Gait Training: 8-22 mins                    G Codes:      Vic Ripper, SPT   Vic Ripper 05/31/2017, 2:27 PM

## 2017-06-02 MED FILL — Heparin Sodium (Porcine) Inj 1000 Unit/ML: INTRAMUSCULAR | Qty: 30 | Status: AC

## 2017-06-02 MED FILL — Sodium Chloride IV Soln 0.9%: INTRAVENOUS | Qty: 1000 | Status: AC

## 2017-06-04 LAB — CULTURE, BLOOD (ROUTINE X 2)
Culture: NO GROWTH
Culture: NO GROWTH

## 2017-06-09 ENCOUNTER — Other Ambulatory Visit (HOSPITAL_COMMUNITY): Payer: Medicare Other

## 2017-06-25 NOTE — Progress Notes (Signed)
Cardiology Office Note   Date:  06/26/2017   ID:  Thomas Mcclure, DOB 1955/05/31, MRN [id number]  PCP:  [name], MD  Cardiologist:  [name]     Chief Complaint  Patient presents with  . Coronary Artery Disease      History of Present Illness: Thomas Mcclure is a 62 y.o. male who presents for CAD and post hospital follow up for back surgery.   He has a history of NSTEMI in 5-2017status post DES to the RCA complicated by LV aneurysm and chronic systolic heart [id number](MO29-47 percent by echo on 12/31/15, but prior to this was 45-50 percent), diabetes, hypertension, hyperlipidemia, and chronic back pain status post spinal stimulator   In 2018 he felt same way with prior MI, admitted and cardiac cath planned.  On cath had patent stent in RCA and non obstructive disease 02/19/17.  Troponin neg.   Also hx of DM, RA, HLD, HTN.    Recently hospitalized for Exploration of lumbar fusion/removal of lumbar hardware, bilateral L2-3 laminotomy/foraminotomies to decompress the bilateral L2 and L3 nerve roots(the work required to do this was in addition to the work required to do the posterior lumbar interbody fusion because of the patient's spinal stenosis, facet arthropathy. (PLIF)  Post op developed PNA.  Treated and pt discharged 05/31/17.   Back on asa at discharge.    Today pt and his wife upset about hospital stay with pt falling because non one answered bell.  He tells me he injured his shoulder he hit the wall with Rt. Shoulder.  Now with pain on raising the Rt arm.  PT has not yet been to his home and pt is ready to be stronger.  He is usually very active and sitting is difficult for him.  He has seen [name] and labs were drawn but no record for me to check.  Will call to see if we can obtain.  He has been to ortho about knee pain and had injection but did not mention his shoulder.  He is to go back next week.    He has no chest pain or SOB, no edema.  No palpitations.  Hid HR is  slightly up today but he denies any fevers. He completed antibiotics.  Walking with a walker today and wearing a back brace.       Past Medical History:  Diagnosis Date  . Baker's cyst    Left calf  . CAD in native artery, 07/15/15 PCI of RCA with DES 07/16/2015   a.   NSTEMI 5/17: LHC - pLAD 20, pLCx 20, OM1 30, pRCA 100, EF 45-50%>> PCI: 2.5 x 24 mm Promus DES to RCA  //  b.   Echo 5/17: mild LVH, EF 50-55%, no RWMA, mod RVE //  c. LHC 6/17: pLAD 20, pLCx 20, OM1 50, pRCA stent ok, EF 35-45% with mod sized inf wall and basal segment aneurysm  . Chronic back pain    "down my back, down my legs" (02/18/2017)  . Constipation   . GERD (gastroesophageal reflux disease)   . Hyperlipidemia   . Hypertension    Dr. [name] [phone]  . Ischemic cardiomyopathy    a. LV-gram at time of LHC in 6/17 with EF 35-45%  //  b. Echo 7/17: EF 45-50%, inferior HK, grade 1 diastolic dysfunction, mildly dilated aortic root, moderately reduced RVSF, mild RAE  . Myocardial infarction ([place]) 2017  . Neuromuscular disorder ([place])    "with  nerve damage"  . Numbness and tingling of both lower extremities    "on the outside of both sides" (02/18/2017)  . Rheumatoid arthritis ([place])   . Type II diabetes mellitus ([place])    diet controlled    Past Surgical History:  Procedure Laterality Date  . BACK SURGERY    . CARDIAC CATHETERIZATION N/A 07/15/2015   Procedure: Left Heart Cath and Coronary Angiography;  Surgeon: [name], MD;  Location: [facility];  Service: Cardiovascular;  Laterality: N/A;  . CARDIAC CATHETERIZATION N/A 07/15/2015   Procedure: Coronary Stent Intervention;  Surgeon: [name], MD;  Location: [facility];  Service: Cardiovascular;  Laterality: N/A;  . CARDIAC CATHETERIZATION N/A 08/07/2015   Procedure: Left Heart Cath and Coronary Angiography;  Surgeon: [name], MD;  Location: [facility];  Service: Cardiovascular;  Laterality: N/A;  . CORONARY  ANGIOPLASTY WITH STENT PLACEMENT  07/2015  . JOINT REPLACEMENT    . LEFT HEART CATH AND CORONARY ANGIOGRAPHY N/A 02/19/2017   Procedure: LEFT HEART CATH AND CORONARY ANGIOGRAPHY;  Surgeon: Martinique, Peter M, MD;  Location: [facility];  Service: Cardiovascular;  Laterality: N/A;  . LUMBAR SPINE SURGERY  03/2009  & 2012  . MASS EXCISION N/A 04/19/2012   Procedure: removal of posterior cervical lipoma;  Surgeon: [name], MD;  Location: [facility];  Service: Neurosurgery;  Laterality: N/A;  Removal of posterior cervical lipoma  . POPLITEAL SYNOVIAL CYST EXCISION Left    "opened up behind my knee; scraped out arthritis"  . SPINAL CORD STIMULATOR INSERTION N/A 01/07/2013   Procedure:  SPINAL CORD STIMULATOR INSERTION;  Surgeon: [name], MD;  Location: [facility];  Service: Neurosurgery;  Laterality: N/A;  . TONSILLECTOMY    . TOTAL HIP ARTHROPLASTY Right 10/03/2016   Procedure: TOTAL HIP ARTHROPLASTY;  Surgeon: [name], MD;  Location: [facility];  Service: Orthopedics;  Laterality: Right;  . WISDOM TOOTH EXTRACTION     hx of     Current Outpatient Medications  Medication Sig Dispense Refill  . aspirin EC 81 MG tablet Take 1 tablet (81 mg total) by mouth daily. 30 tablet 1  . atorvastatin (LIPITOR) 80 MG tablet Take 80 mg by mouth daily at 6 PM.    . chlorthalidone (HYGROTON) 25 MG tablet Take 1 tablet (25 mg total) by mouth daily. 90 tablet 2  . cyclobenzaprine (FLEXERIL) 10 MG tablet Take 10 mg by mouth daily as needed for muscle spasms.    . diazepam (VALIUM) 10 MG tablet Take 10 mg by mouth 2 (two) times daily.     Marland Kitchen DILT-XR 180 MG 24 hr capsule Take 180 mg by mouth once daily  4  . DULoxetine (CYMBALTA) 60 MG capsule Take 60 mg by mouth daily.    Marland Kitchen lisinopril (PRINIVIL,ZESTRIL) 20 MG tablet Take 20 mg by mouth at bedtime.   3  . metFORMIN (GLUCOPHAGE-XR) 750 MG 24 hr tablet Take 750 mg by mouth at bedtime.     . Multiple Vitamin (MULTIVITAMIN WITH MINERALS) TABS  tablet Take 1 tablet by mouth daily.    . nitroGLYCERIN (NITROSTAT) 0.4 MG SL tablet Place 0.4 mg under the tongue every 5 (five) minutes as needed for chest pain.    Marland Kitchen omeprazole (PRILOSEC) 40 MG capsule Take 40 mg by mouth daily.    . Oxycodone HCl 10 MG TABS Take 1 tablet (10 mg total) by mouth every 4 (four) hours as needed (  pain). scheduled 80 tablet 0  . polyethylene glycol powder (GLYCOLAX/MIRALAX) powder Take 17 g by mouth daily. Mix in 8 oz water, juice, soda, coffee or tea and drink 250 g 0  . potassium chloride SA (K-DUR,KLOR-CON) 20 MEQ tablet Take 1 tablet (20 mEq total) by mouth daily. 90 tablet 3  . pregabalin (LYRICA) 100 MG capsule Take 100-200 mg by mouth See admin instructions. Take 100 mg in the morning and 200 mg at night    . senna-docusate (SENOKOT S) 8.6-50 MG tablet Take 1 tablet by mouth 2 (two) times daily.    . traMADol (ULTRAM) 50 MG tablet Take 50 mg by mouth 3 (three) times daily. scheduled     No current facility-administered medications for this visit.     Allergies:   Brilinta [ticagrelor]; Methylprednisolone; Prednisone; Toradol [ketorolac tromethamine]; and Adhesive [tape]    Social History:  The patient  reports that he has quit smoking. His smoking use included cigarettes. He has a 10.00 pack-year smoking history. He has never used smokeless tobacco. He reports that he does not drink alcohol or use drugs.   Family History:  The patient's family history includes Heart disease in his mother; Prostate cancer in his brother.    ROS:  General:no colds or fevers once PNA resolved, some weight changes Skin:no rashes or ulcers HEENT:no blurred vision, no congestion CV:see HPI PUL:see HPI GI:no diarrhea constipation now -did have some post op, no melena, no indigestion GU:no hematuria, no dysuria MS:no joint pain, no claudication Neuro:no syncope, no lightheadedness Endo:+ diabetes, no thyroid disease  Wt Readings from Last 3 Encounters:  06/26/17 261 lb  6.4 oz (118.6 kg)  05/27/17 257 lb (116.6 kg)  05/13/17 257 lb 6.4 oz (116.8 kg)     PHYSICAL EXAM: VS:  BP 110/68   Pulse (!) 105   Ht 5\' 11"  (1.803 m)   Wt 261 lb 6.4 oz (118.6 kg)   SpO2 96%   BMI 36.46 kg/m  , BMI Body mass index is 36.46 kg/m. General:Pleasant affect, NAD Skin:Warm and dry, brisk capillary refill HEENT:normocephalic, sclera clear, mucus membranes moist Neck:supple, no JVD, no bruits  Heart:S1S2 RRR without murmur, gallup, rub or click Lungs:clear without rales, rhonchi, or wheezes WUJ:WJXB, non tender, + BS, do not palpate liver spleen or masses Ext:no lower ext edema, 2+ pedal pulses, 2+ radial pulses, back brace in place, Rt shoulder with tenderness ant and lateral.  + pain with raising the arm but is able to do so. Neuro:alert and oriented X 3, MAE, follows commands, + facial symmetry    EKG:  EKG is NOT ordered today.   Recent Labs: 02/18/2017: ALT 26; B Natriuretic Peptide 14.6; TSH 0.589 05/31/2017: BUN 15; Creatinine, Ser 1.00; Hemoglobin 8.9; Magnesium 2.3; Platelets 257; Potassium 3.5; Sodium 136    Lipid Panel    Component Value Date/Time   CHOL 107 02/19/2017 0222   CHOL 118 05/13/2016 0831   TRIG 131 02/19/2017 0222   HDL 25 (L) 02/19/2017 0222   HDL 32 (L) 05/13/2016 0831   CHOLHDL 4.3 02/19/2017 0222   VLDL 26 02/19/2017 0222   LDLCALC 56 02/19/2017 0222   [place] 65 05/13/2016 0831       Other studies Reviewed: Additional studies/ records that were reviewed today include: hospital notes. Cardiac cath 02/19/18 Conclusion     Prox LAD to Mid LAD lesion is 20% stenosed.  Prox Cx to Mid Cx lesion is 20% stenosed.  Ost 1st Mrg to 1st Mrg lesion  is 50% stenosed.  Non-stenotic Prox RCA lesion previously treated.  The left ventricular systolic function is normal.  LV end diastolic pressure is normal.  The left ventricular ejection fraction is 50-55% by visual estimate.   1. Nonobstructive CAD. Patent stent in the RCA.    2. Overall good LV function 3. Normal LVEDP  Plan: continue medical therapy   Echo 12/31/2015  Study Conclusions  - Left ventricle: The cavity size was normal. There was mild   concentric hypertrophy. Systolic function was normal. The   estimated ejection fraction was in the range of 55% to 60%. There   is akinesis of the basal-midinferior myocardium. Doppler   parameters are consistent with abnormal left ventricular   relaxation (grade 1 diastolic dysfunction).  ASSESSMENT AND PLAN:  1.  CAD with hx of RCA stent, open on cath 01/2017, and non obstructive CAD, no chest pain.  Is on ASA post surgeries.  Follow up with [name] in 4 months.    2.  HLD continue statin- lipitor.   3.  Hx syncope no further episodes.    4.   HTN well controlled  5.   Diabetes per [name]  6.    Recent surgery on back and followed by PNA, treated. To follow with Dr. [name].  Labs with hospital were abnormal but has had some with [name]. Will check with his office.        Current medicines are reviewed with the patient today.  The patient Has no concerns regarding medicines.  The following changes have been made:  See above Labs/ tests ordered today include:see above  Disposition:   FU:  see above  Signed, Cecilie Kicks, NP  06/26/2017 8:07 AM    [facility], [address], [place], [place] Phone: [phone]; Fax: [phone]

## 2017-06-26 ENCOUNTER — Encounter: Payer: Self-pay | Admitting: Cardiology

## 2017-06-26 ENCOUNTER — Ambulatory Visit (INDEPENDENT_AMBULATORY_CARE_PROVIDER_SITE_OTHER): Payer: Medicare Other | Admitting: Cardiology

## 2017-06-26 VITALS — BP 110/68 | HR 105 | Ht 71.0 in | Wt 261.4 lb

## 2017-06-26 DIAGNOSIS — E785 Hyperlipidemia, unspecified: Secondary | ICD-10-CM

## 2017-06-26 DIAGNOSIS — I2510 Atherosclerotic heart disease of native coronary artery without angina pectoris: Secondary | ICD-10-CM | POA: Diagnosis not present

## 2017-06-26 DIAGNOSIS — I10 Essential (primary) hypertension: Secondary | ICD-10-CM | POA: Diagnosis not present

## 2017-06-26 DIAGNOSIS — E118 Type 2 diabetes mellitus with unspecified complications: Secondary | ICD-10-CM

## 2017-06-26 DIAGNOSIS — Z87898 Personal history of other specified conditions: Secondary | ICD-10-CM

## 2017-06-26 NOTE — Patient Instructions (Signed)
Medication Instructions:  Your physician recommends that you continue on your current medications as directed. Please refer to the Current Medication list given to you today.   Labwork: None ordered  Testing/Procedures: None ordered  Follow-Up: Your physician recommends that you schedule a follow-up appointment in: 4 MONTHS WITH DR. SMITH  Any Other Special Instructions Will Be Listed Below (If Applicable).     If you need a refill on your cardiac medications before your next appointment, please call your pharmacy.   

## 2017-08-12 LAB — HM DIABETES EYE EXAM

## 2017-09-24 ENCOUNTER — Encounter: Payer: Self-pay | Admitting: Physical Medicine & Rehabilitation

## 2017-10-12 LAB — HM DIABETES EYE EXAM

## 2017-10-15 ENCOUNTER — Encounter: Payer: Self-pay | Admitting: Physical Medicine & Rehabilitation

## 2017-10-15 ENCOUNTER — Encounter: Payer: Medicare Other | Attending: Physical Medicine & Rehabilitation

## 2017-10-15 ENCOUNTER — Ambulatory Visit (HOSPITAL_BASED_OUTPATIENT_CLINIC_OR_DEPARTMENT_OTHER): Payer: Medicare Other | Admitting: Physical Medicine & Rehabilitation

## 2017-10-15 VITALS — BP 123/82 | HR 89 | Resp 14 | Ht 69.0 in | Wt 271.0 lb

## 2017-10-15 DIAGNOSIS — I2510 Atherosclerotic heart disease of native coronary artery without angina pectoris: Secondary | ICD-10-CM | POA: Insufficient documentation

## 2017-10-15 DIAGNOSIS — Z79899 Other long term (current) drug therapy: Secondary | ICD-10-CM | POA: Insufficient documentation

## 2017-10-15 DIAGNOSIS — Z955 Presence of coronary angioplasty implant and graft: Secondary | ICD-10-CM | POA: Insufficient documentation

## 2017-10-15 DIAGNOSIS — Z96641 Presence of right artificial hip joint: Secondary | ICD-10-CM | POA: Insufficient documentation

## 2017-10-15 DIAGNOSIS — Z8042 Family history of malignant neoplasm of prostate: Secondary | ICD-10-CM | POA: Diagnosis not present

## 2017-10-15 DIAGNOSIS — I252 Old myocardial infarction: Secondary | ICD-10-CM | POA: Diagnosis not present

## 2017-10-15 DIAGNOSIS — G8928 Other chronic postprocedural pain: Secondary | ICD-10-CM | POA: Insufficient documentation

## 2017-10-15 DIAGNOSIS — Z79891 Long term (current) use of opiate analgesic: Secondary | ICD-10-CM

## 2017-10-15 DIAGNOSIS — M961 Postlaminectomy syndrome, not elsewhere classified: Secondary | ICD-10-CM | POA: Insufficient documentation

## 2017-10-15 DIAGNOSIS — M545 Low back pain: Secondary | ICD-10-CM | POA: Diagnosis not present

## 2017-10-15 DIAGNOSIS — Z9689 Presence of other specified functional implants: Secondary | ICD-10-CM | POA: Insufficient documentation

## 2017-10-15 DIAGNOSIS — K219 Gastro-esophageal reflux disease without esophagitis: Secondary | ICD-10-CM | POA: Diagnosis not present

## 2017-10-15 DIAGNOSIS — G8929 Other chronic pain: Secondary | ICD-10-CM | POA: Diagnosis present

## 2017-10-15 DIAGNOSIS — E119 Type 2 diabetes mellitus without complications: Secondary | ICD-10-CM | POA: Diagnosis not present

## 2017-10-15 DIAGNOSIS — Z5181 Encounter for therapeutic drug level monitoring: Secondary | ICD-10-CM | POA: Diagnosis not present

## 2017-10-15 DIAGNOSIS — E785 Hyperlipidemia, unspecified: Secondary | ICD-10-CM | POA: Insufficient documentation

## 2017-10-15 DIAGNOSIS — I255 Ischemic cardiomyopathy: Secondary | ICD-10-CM | POA: Insufficient documentation

## 2017-10-15 DIAGNOSIS — Z8249 Family history of ischemic heart disease and other diseases of the circulatory system: Secondary | ICD-10-CM | POA: Insufficient documentation

## 2017-10-15 DIAGNOSIS — M069 Rheumatoid arthritis, unspecified: Secondary | ICD-10-CM | POA: Insufficient documentation

## 2017-10-15 DIAGNOSIS — G894 Chronic pain syndrome: Secondary | ICD-10-CM | POA: Diagnosis not present

## 2017-10-15 DIAGNOSIS — Z981 Arthrodesis status: Secondary | ICD-10-CM | POA: Insufficient documentation

## 2017-10-15 DIAGNOSIS — Z87891 Personal history of nicotine dependence: Secondary | ICD-10-CM | POA: Diagnosis not present

## 2017-10-15 DIAGNOSIS — I10 Essential (primary) hypertension: Secondary | ICD-10-CM | POA: Insufficient documentation

## 2017-10-15 NOTE — Patient Instructions (Signed)
Need results of urine drug screen prior to starting pain meds Will need your primary to prescribe 1 more week of pain med  Would recommend long acting Oxycontin  Wean Diazepam to 5mg  TID x 1 mo then 2.5mg  TID for 1 month then 2.5mg  BID for a mont then 2.5mg  daily for 1 month then discontinue

## 2017-10-15 NOTE — Progress Notes (Signed)
Subjective:    Patient ID: Thomas Mcclure, male    DOB: 06-Oct-1955, 62 y.o.   MRN: [id number]  HPI  CC:  Low back pain 62 year old male with history of hypertension, diabetes, coronary artery disease, who is referred for chronic low back pain.  His history stretches back several years, he had surgery with Dr. [name] in 2011 L3-S1 fusion.  Spinal cord stimulator placed in 2014 with leads in the thoracic area.  Patient's fusion was extended to L2-3 as of April 2019. Patient has been on chronic narcotic analgesics for many years prescribed by his primary care physician Dr. Kevan Ny who has recently retired Taking about 7 tabs of oxy IR 10mg  per day although his prescription from July 2019 was for 6/day  Diazepam 10mg  TID, we discussed that this medication is not recommended to be taken along with narcotic analgesics because it can increase overdose risk.  He is open to reducing this.  Does have some chronic lower extremity pain and has been on Lyrica 100 mg in the morning and 200 mg at night  Patient has been seen by me in 2012 however this was pre-EMR  Pain Inventory Average Pain 10 Pain Right Now 10 My pain is constant, sharp, burning, tingling and aching  In the last 24 hours, has pain interfered with the following? General activity 10 Relation with others 10 Enjoyment of life 10 What TIME of day is your pain at its worst? all Sleep (in general) Fair  Pain is worse with: walking, bending, sitting and standing Pain improves with: medication Relief from Meds: 4  Mobility walk without assistance walk with assistance use a cane use a walker how many minutes can you walk? 5 ability to climb steps?  no do you drive?  yes  Function disabled: date disabled . I need assistance with the following:  dressing, meal prep, household duties and shopping  Neuro/Psych weakness numbness tingling trouble walking spasms  Prior Studies new  Physicians involved in your  care new   Family History  Problem Relation Age of Onset  . Heart disease Mother   . Prostate cancer Brother    Social History   Socioeconomic History  . Marital status: Married    Spouse name: Not on file  . Number of children: 5  . Years of education: Not on file  . Highest education level: Not on file  Occupational History  . Occupation: Development worker, community, now on disability due to back pain  Social Needs  . Financial resource strain: Not on file  . Food insecurity:    Worry: Not on file    Inability: Not on file  . Transportation needs:    Medical: Not on file    Non-medical: Not on file  Tobacco Use  . Smoking status: Former Smoker    Packs/day: 0.25    Years: 40.00    Pack years: 10.00    Types: Cigarettes  . Smokeless tobacco: Never Used  . Tobacco comment: 02/18/2017 "quit in 2012"  Substance and Sexual Activity  . Alcohol use: No    Alcohol/week: 0.0 standard drinks  . Drug use: No  . Sexual activity: Not Currently  Lifestyle  . Physical activity:    Days per week: Not on file    Minutes per session: Not on file  . Stress: Not on file  Relationships  . Social connections:    Talks on phone: Not on file    Gets together: Not on file  Attends religious service: Not on file    Active member of club or organization: Not on file    Attends meetings of clubs or organizations: Not on file    Relationship status: Not on file  Other Topics Concern  . Not on file  Social History Narrative  . Not on file   Past Surgical History:  Procedure Laterality Date  . BACK SURGERY    . CARDIAC CATHETERIZATION N/A 07/15/2015   Procedure: Left Heart Cath and Coronary Angiography;  Surgeon: [name], MD;  Location: [facility];  Service: Cardiovascular;  Laterality: N/A;  . CARDIAC CATHETERIZATION N/A 07/15/2015   Procedure: Coronary Stent Intervention;  Surgeon: [name], MD;  Location: [facility];  Service: Cardiovascular;   Laterality: N/A;  . CARDIAC CATHETERIZATION N/A 08/07/2015   Procedure: Left Heart Cath and Coronary Angiography;  Surgeon: [name], MD;  Location: [facility];  Service: Cardiovascular;  Laterality: N/A;  . CORONARY ANGIOPLASTY WITH STENT PLACEMENT  07/2015  . JOINT REPLACEMENT    . LEFT HEART CATH AND CORONARY ANGIOGRAPHY N/A 02/19/2017   Procedure: LEFT HEART CATH AND CORONARY ANGIOGRAPHY;  Surgeon: Martinique, Peter M, MD;  Location: [facility];  Service: Cardiovascular;  Laterality: N/A;  . LUMBAR SPINE SURGERY  03/2009  & 2012  . MASS EXCISION N/A 04/19/2012   Procedure: removal of posterior cervical lipoma;  Surgeon: [name], MD;  Location: [facility];  Service: Neurosurgery;  Laterality: N/A;  Removal of posterior cervical lipoma  . POPLITEAL SYNOVIAL CYST EXCISION Left    "opened up behind my knee; scraped out arthritis"  . SPINAL CORD STIMULATOR INSERTION N/A 01/07/2013   Procedure:  SPINAL CORD STIMULATOR INSERTION;  Surgeon: [name], MD;  Location: [facility];  Service: Neurosurgery;  Laterality: N/A;  . TONSILLECTOMY    . TOTAL HIP ARTHROPLASTY Right 10/03/2016   Procedure: TOTAL HIP ARTHROPLASTY;  Surgeon: [name], MD;  Location: [facility];  Service: Orthopedics;  Laterality: Right;  . WISDOM TOOTH EXTRACTION     hx of   Past Medical History:  Diagnosis Date  . Baker's cyst    Left calf  . CAD in native artery, 07/15/15 PCI of RCA with DES 07/16/2015   a.   NSTEMI 5/17: LHC - pLAD 20, pLCx 20, OM1 30, pRCA 100, EF 45-50%>> PCI: 2.5 x 24 mm Promus DES to RCA  //  b.   Echo 5/17: mild LVH, EF 50-55%, no RWMA, mod RVE //  c. LHC 6/17: pLAD 20, pLCx 20, OM1 50, pRCA stent ok, EF 35-45% with mod sized inf wall and basal segment aneurysm  . Chronic back pain    "down my back, down my legs" (02/18/2017)  . Constipation   . GERD (gastroesophageal reflux disease)   . Hyperlipidemia   . Hypertension    Dr. [name] [phone]  . Ischemic  cardiomyopathy    a. LV-gram at time of LHC in 6/17 with EF 35-45%  //  b. Echo 7/17: EF 45-50%, inferior HK, grade 1 diastolic dysfunction, mildly dilated aortic root, moderately reduced RVSF, mild RAE  . Myocardial infarction ([place]) 2017  . Neuromuscular disorder ([place])    "with nerve damage"  . Numbness and tingling of both lower extremities    "on the outside of both sides" (02/18/2017)  . Rheumatoid arthritis ([place])   . Type II diabetes mellitus ([facility])    diet controlled   BP  123/82   Pulse 89   Resp 14   Ht 5\' 9"  (1.753 m)   Wt 271 lb (122.9 kg)   SpO2 90%   BMI 40.02 kg/m   Opioid Risk Score:   Fall Risk Score:  `1  Depression screen PHQ 2/9  Depression screen [facility] 2/9 10/15/2017 01/28/2016 11/12/2015  Decreased Interest 1 0 0  Down, Depressed, Hopeless 0 0 0  PHQ - 2 Score 1 0 0  Altered sleeping 1 - -  Tired, decreased energy 1 - -  Change in appetite 0 - -  Feeling bad or failure about yourself  0 - -  Trouble concentrating 0 - -  Moving slowly or fidgety/restless 0 - -  Suicidal thoughts 0 - -  PHQ-9 Score 3 - -    Review of Systems  Constitutional: Positive for diaphoresis and unexpected weight change.  Respiratory: Positive for wheezing.   Endocrine:       High blood sugar  Genitourinary: Positive for difficulty urinating.  Musculoskeletal: Positive for arthralgias, back pain and gait problem.       Spasms   Neurological:       Tingling        Objective:   Physical Exam  Constitutional: He is oriented to person, place, and time. He appears well-developed and well-nourished.  HENT:  Head: Normocephalic and atraumatic.  Eyes: Pupils are equal, round, and reactive to light. EOM are normal.  Cardiovascular: Normal rate, regular rhythm and normal heart sounds. Exam reveals no friction rub.  No murmur heard. Pulmonary/Chest: Effort normal and breath sounds normal. No stridor. No respiratory distress.  Abdominal: Soft. Bowel sounds are normal. He exhibits  no distension. There is no tenderness.  Musculoskeletal:       Right hip: He exhibits decreased range of motion. He exhibits normal strength and no deformity.       Left hip: He exhibits normal range of motion, no tenderness and no deformity.       Lumbar back: He exhibits decreased range of motion, tenderness and deformity.  Number spine range of motion less than 25% of normal with flexion, extension, lateral bending, and rotation. Negative straight leg raising Hip internal and external rotation mildly reduced on the right side compared to the left side  Neurological: He is alert and oriented to person, place, and time. He displays no atrophy. Gait abnormal.  Motor strength is 4/5 bilateral hip flexor knee extensor ankle dorsiflexor limited by pain  Relates with rolling walker no evidence of toe drag or knee instability.  Skin: Skin is warm and dry.  Psychiatric: He has a normal mood and affect.  Nursing note and vitals reviewed.         Assessment & Plan:  1.  Lumbar postlaminectomy syndrome chronic postoperative pain.  He has had multilevel fusion as well as spinal cord stimulator placement despite this he has had persistent pain in the low back area.  He has been on moderate to high-dose narcotic analgesics.  Goal would be to keep morphine milliequivalents to 100 mg or less per day. His prescribed opiate doses oxycodone 10 mg 6 tablets/day.  We discussed that he may be a better candidate for longer acting medication given his frequency of dosing.  He has nocturnal pain that awakens him at times.  If his urine drug screen shows no evidence of illicit drugs or month nonprescribed opiates would consider the following medications.  This has been discussed with patient and he agrees  [place]  27mg  BID vs Oxycontin 30mg  BID or 20mg  TID  Wean Diazepam to 5mg  TID x 1 mo then 2.5mg  TID for 1 month then 2.5mg  BID for a mont then 2.5mg  daily for 1 month then discontinue  Continue Lyrica  current dose 100 mg the morning and 200 g at night

## 2017-10-20 ENCOUNTER — Telehealth: Payer: Self-pay | Admitting: *Deleted

## 2017-10-20 LAB — TOXASSURE SELECT,+ANTIDEPR,UR

## 2017-10-20 NOTE — Telephone Encounter (Signed)
Spoke with patients wife and informed that results have not come in. Patient informed that they went back to primary and informed that Dr, Letta Pate has not taken over pain medications, that primary is still on the hook for pain meds.  Primary disagreed and refused to give more than a 3- day supply. I offered patient that I would fax a letter stating clinic requirements

## 2017-10-20 NOTE — Telephone Encounter (Signed)
Patients wife left a message asking about patients UDS results and possibility of  pain medication refill

## 2017-10-21 ENCOUNTER — Telehealth: Payer: Self-pay | Admitting: *Deleted

## 2017-10-21 NOTE — Telephone Encounter (Signed)
Xtampza 27 mg #60 BID  has been approved by [employer]. Next step is to submit electronic script and see what thay co-pay is going to be.

## 2017-10-21 NOTE — Telephone Encounter (Signed)
Insurance will not pay for Oxycontin but more than likely will Xtampza but a prior authorization will be required. Will send message to [place] to place order.

## 2017-10-21 NOTE — Telephone Encounter (Signed)
Per Dr. Letta Pate will start Xtampza 27 mg BID vs Oxycontin 30 mg BID. Yvone Neu states pt insurance will absolutely not pay for Oxycontin. Can you send in [place] and Yvone Neu will start a prior authorization on it?

## 2017-10-21 NOTE — Telephone Encounter (Signed)
Please check insurance benefits, will start Xtampza 27mg  BID vs Oxycontin 30mg  BID depending on what his insurance covers.  You may ask Zella Ball to place the order

## 2017-10-21 NOTE — Telephone Encounter (Addendum)
Urine drug screen for this encounter is consistent for prescribed medication. He was prescribed oxycodone 10 mg 10/01/17 #120 (per PMP this was 20 day supply) and diazepam 5 mg #90 on 09/17/17

## 2017-10-21 NOTE — Telephone Encounter (Signed)
Spoke to pt wife and let her know that UDS was back and we are working on sending in a medication for pt.

## 2017-10-22 MED ORDER — OXYCODONE ER 27 MG PO C12A: 27.0000 mg | EXTENDED_RELEASE_CAPSULE | Freq: Two times a day (BID) | ORAL | 0 refills | Status: DC

## 2017-10-22 NOTE — Telephone Encounter (Signed)
Dr. Letta Pate submitted script electronically.  Patient notified

## 2017-10-22 NOTE — Telephone Encounter (Signed)
Prior authorization completed and approved.  Dr.  Letta Pate submitted a script electronically.  Patient notified

## 2017-11-03 ENCOUNTER — Ambulatory Visit: Payer: Medicare Other

## 2017-11-03 NOTE — Progress Notes (Signed)
Cardiology Office Note:    Date:  11/04/2017   ID:  Thomas Mcclure, DOB 03/25/1955, MRN [id number]  PCP:  [name], MD  Cardiologist:  Sinclair Grooms, MD   Referring MD: [name], MD   Chief Complaint  Patient presents with  . Coronary Artery Disease    History of Present Illness:    Thomas Mcclure is a 62 y.o. male with a hx of history of NSTEMI in 5-2017status post DES to the RCA. 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 Cath in 2018 demonstrated patent.  Ran out of his prescription for chlorthalidone.  Missed approximately 5 days.  Has noted lower extremity swelling.  No shortness of breath.  He denies angina.  No palpitations or tachycardia.  Still having difficulty with his lower back.  Past Medical History:  Diagnosis Date  . Baker's cyst    Left calf  . CAD in native artery, 07/15/15 PCI of RCA with DES 07/16/2015   a.   NSTEMI 5/17: LHC - pLAD 20, pLCx 20, OM1 30, pRCA 100, EF 45-50%>> PCI: 2.5 x 24 mm Promus DES to RCA  //  b.   Echo 5/17: mild LVH, EF 50-55%, no RWMA, mod RVE //  c. LHC 6/17: pLAD 20, pLCx 20, OM1 50, pRCA stent ok, EF 35-45% with mod sized inf wall and basal segment aneurysm  . Chronic back pain    "down my back, down my legs" (02/18/2017)  . Constipation   . GERD (gastroesophageal reflux disease)   . Hyperlipidemia   . Hypertension    Dr. [name] [phone]  . Ischemic cardiomyopathy    a. LV-gram at time of LHC in 6/17 with EF 35-45%  //  b. Echo 7/17: EF 45-50%, inferior HK, grade 1 diastolic dysfunction, mildly dilated aortic root, moderately reduced RVSF, mild RAE  . Myocardial infarction ([place]) 2017  . 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 Numbness and tingling of both lower extremities    "on the outside of both sides" (02/18/2017)  . Rheumatoid arthritis ([place])   . Type II diabetes mellitus ([place])    diet controlled    Past Surgical History:  Procedure Laterality Date  . BACK SURGERY    . CARDIAC CATHETERIZATION N/A 07/15/2015   Procedure: Left Heart Cath and Coronary Angiography;  Surgeon: [name], MD;  Location: [facility];  Service: Cardiovascular;  Laterality: N/A;  . CARDIAC CATHETERIZATION N/A 07/15/2015   Procedure: Coronary Stent Intervention;  Surgeon: [name], MD;  Location: [facility];  Service: Cardiovascular;  Laterality: N/A;  . CARDIAC CATHETERIZATION N/A 08/07/2015   Procedure: Left Heart Cath and Coronary Angiography;  Surgeon: [name], MD;  Location: [facility];  Service: Cardiovascular;  Laterality: N/A;  . CORONARY ANGIOPLASTY WITH STENT PLACEMENT  07/2015  . JOINT REPLACEMENT    . LEFT HEART CATH AND CORONARY ANGIOGRAPHY N/A 02/19/2017   Procedure: LEFT HEART CATH AND CORONARY ANGIOGRAPHY;  Surgeon: Martinique, Peter M, MD;  Location: [facility];  Service: Cardiovascular;  Laterality: N/A;  . LUMBAR SPINE SURGERY  03/2009  & 2012  . MASS EXCISION N/A 04/19/2012   Procedure: removal of posterior cervical lipoma;  Surgeon: [name], MD;  Location:  [facility];  Service: Neurosurgery;  Laterality: N/A;  Removal of posterior cervical lipoma  . POPLITEAL SYNOVIAL CYST EXCISION Left    "opened up behind my knee; scraped out arthritis"  . SPINAL CORD STIMULATOR INSERTION N/A 01/07/2013   Procedure:  SPINAL CORD STIMULATOR INSERTION;  Surgeon: [name], MD;  [facility]: [facility];  Service: Neurosurgery;  Laterality: N/A;  . TONSILLECTOMY    . TOTAL HIP ARTHROPLASTY Right 10/03/2016   Procedure: TOTAL HIP ARTHROPLASTY;  Surgeon: [name], MD;  Location: [facility];  Service: Orthopedics;  Laterality: Right;  . WISDOM TOOTH EXTRACTION     hx of    Current Medications: Current Meds  Medication Sig   . aspirin EC 81 MG tablet Take 1 tablet (81 mg total) by mouth daily.  Marland Kitchen atorvastatin (LIPITOR) 80 MG tablet Take 80 mg by mouth daily at 6 PM.  . chlorthalidone (HYGROTON) 25 MG tablet Take 1 tablet (25 mg total) by mouth daily.  . cyclobenzaprine (FLEXERIL) 10 MG tablet Take 10 mg by mouth daily as needed for muscle spasms.  . diazepam (VALIUM) 5 MG tablet Take 3 tablets by mouth daily.  Marland Kitchen DILT-XR 180 MG 24 hr capsule Take 180 mg by mouth once daily  . DULoxetine (CYMBALTA) 60 MG capsule Take 60 mg by mouth daily.  Marland Kitchen lisinopril (PRINIVIL,ZESTRIL) 20 MG tablet Take 20 mg by mouth at bedtime.   . metFORMIN (GLUCOPHAGE-XR) 750 MG 24 hr tablet Take 750 mg by mouth at bedtime.   . Multiple Vitamin (MULTIVITAMIN WITH MINERALS) TABS tablet Take 1 tablet by mouth daily.  . nitroGLYCERIN (NITROSTAT) 0.4 MG SL tablet Place 0.4 mg under the tongue every 5 (five) minutes as needed for chest pain.  Marland Kitchen omeprazole (PRILOSEC) 40 MG capsule Take 40 mg by mouth daily.  Marland Kitchen oxyCODONE ER (XTAMPZA ER) 27 MG C12A Take 27 mg by mouth every 12 (twelve) hours.  . polyethylene glycol powder (GLYCOLAX/MIRALAX) powder Take 17 g by mouth daily. Mix in 8 oz water, juice, soda, coffee or tea and drink  . potassium chloride SA (K-DUR,KLOR-CON) 20 MEQ tablet Take 1 tablet (20 mEq total) by mouth daily.  . pregabalin (LYRICA) 100 MG capsule Take 100-200 mg by mouth See admin instructions. Take 100 mg in the morning and 200 mg at night  . senna-docusate (SENOKOT S) 8.6-50 MG tablet Take 1 tablet by mouth 2 (two) times daily.     Allergies:   Brilinta [ticagrelor]; Methylprednisolone; Prednisone; Toradol [ketorolac tromethamine]; and Adhesive [tape]   Social History   Socioeconomic History  . Marital status: Married    Spouse name: Not on file  . Number of children: 5  . Years of education: Not on file  . Highest education level: Not on file  Occupational History  . Occupation: Development worker, community, now on disability  due to back pain  Social Needs  . Financial resource strain: Not on file  . Food insecurity:    Worry: Not on file    Inability: Not on file  . Transportation needs:    Medical: Not on file    Non-medical: Not on file  Tobacco Use  . Smoking status: Former Smoker    Packs/day: 0.25    Years: 40.00    Pack years: 10.00    Types: Cigarettes  . Smokeless tobacco: Never Used  . Tobacco comment: 02/18/2017 "quit in 2012"  Substance and Sexual Activity  . Alcohol use: No    Alcohol/week: 0.0 standard drinks  .  Drug use: No  . Sexual activity: Not Currently  Lifestyle  . Physical activity:    Days per week: Not on file    Minutes per session: Not on file  . Stress: Not on file  Relationships  . Social connections:    Talks on phone: Not on file    Gets together: Not on file    Attends religious service: Not on file    Active member of club or organization: Not on file    Attends meetings of clubs or organizations: Not on file    Relationship status: Not on file  Other Topics Concern  . Not on file  Social History Narrative  . Not on file     Family History: The patient's family history includes Heart disease in his mother; Prostate cancer in his brother.  ROS:   Please see the history of present illness.    Chronic back pain all other systems reviewed and are negative.  EKGs/Labs/Other Studies Reviewed:    The following studies were reviewed today: No new data  EKG:  EKG is not ordered today.    Recent Labs: 02/18/2017: ALT 26; B Natriuretic Peptide 14.6; TSH 0.589 05/31/2017: BUN 15; Creatinine, Ser 1.00; Hemoglobin 8.9; Magnesium 2.3; Platelets 257; Potassium 3.5; Sodium 136  Recent Lipid Panel    Component Value Date/Time   CHOL 107 02/19/2017 0222   CHOL 118 05/13/2016 0831   TRIG 131 02/19/2017 0222   HDL 25 (L) 02/19/2017 0222   HDL 32 (L) 05/13/2016 0831   CHOLHDL 4.3 02/19/2017 0222   VLDL 26 02/19/2017 0222   LDLCALC 56 02/19/2017 0222   LDLCALC 65  05/13/2016 0831    Physical Exam:    VS:  BP 128/80   Pulse 80   Ht '5\' 9"'$  (1.753 m)   Wt 274 lb 12.8 oz (124.6 kg)   SpO2 91%   BMI 40.58 kg/m     Wt Readings from Last 3 Encounters:  11/04/17 274 lb 12.8 oz (124.6 kg)  10/15/17 271 lb (122.9 kg)  06/26/17 261 lb 6.4 oz (118.6 kg)     GEN: Obese.  Well nourished, well developed in no acute distress HEENT: Normal NECK: No JVD. LYMPHATICS: No lymphadenopathy CARDIAC: RRR, no murmur, S4 gallop, no edema. VASCULAR: 2+ symmetric bilateral pulses.  No bruits. RESPIRATORY:  Clear to auscultation without rales, wheezing or rhonchi  ABDOMEN: Soft, non-tender, non-distended, No pulsatile mass, MUSCULOSKELETAL: No deformity  SKIN: Warm and dry NEUROLOGIC:  Alert and oriented x 3 PSYCHIATRIC:  Normal affect   ASSESSMENT:    1. Coronary artery disease involving native coronary artery of native heart without angina pectoris   2. Chronic combined systolic and diastolic CHF (congestive heart failure) ([place])   3. Controlled type 2 diabetes mellitus with complication, without long-term current use of insulin ([place])   4. Essential hypertension   5. Hyperlipidemia with target LDL less than 100    PLAN:    In order of problems listed above:  1. No anginal or ischemic complications.  Secondary risk modification to usual targets: LDL less than 70, hemoglobin A1c less than 7, blood pressure target 130/80 mmHg. 2. Lower extremity edema likely related to excessive volume and salt intake and missing diuretic therapy for 5 days.  Plan furosemide 40 mg daily for 3 days.  Comprehensive metabolic panel and BNP today.  After 3 days will resume usual dose of chlorthalidone. 3. Not addressed 4. Blood pressures at target.  Goal is less than  130/80 mmHg. 5. LDL target less than 70.  Most recent LDL was 56 in 2018.  Lipid panel will also be obtained today.  Clinical follow-up in 1 year.  Continue medication as listed.   Medication Adjustments/Labs and  Tests Ordered: Current medicines are reviewed at length with the patient today.  Concerns regarding medicines are outlined above.  Orders Placed This Encounter  Procedures  . Comp Met (CMET)  . Pro b natriuretic peptide   Meds ordered this encounter  Medications  . furosemide (LASIX) 40 MG tablet    Sig: Take one tablet by mouth daily for 3 days    Dispense:  3 tablet    Refill:  0    Patient Instructions  Medication Instructions:  Your physician has recommended you make the following change in your medication:  Take furosemide 40 mg by mouth daily for next 3 days (Thurs, Fri and Sat).  Do not take chlorthalidone these days.  On Sunday resume normal Chlorthalidone dose.   Labwork: Lab work to be done today--BNP and CMET  Testing/Procedures: none  Follow-Up: Your physician wants you to follow-up in: 6 months with NP/PA on Dr.Smith's team and 12 months with [name]. You will receive a reminder letter in the mail two months in advance. If you don't receive a letter, please call our office to schedule the follow-up appointment.   Any Other Special Instructions Will Be Listed Below (If Applicable).     If you need a refill on your cardiac medications before your next appointment, please call your pharmacy.      Signed, Sinclair Grooms, MD  11/04/2017 1:21 PM    [facility]

## 2017-11-04 ENCOUNTER — Encounter: Payer: Self-pay | Admitting: Interventional Cardiology

## 2017-11-04 ENCOUNTER — Ambulatory Visit (INDEPENDENT_AMBULATORY_CARE_PROVIDER_SITE_OTHER): Payer: Medicare Other | Admitting: Interventional Cardiology

## 2017-11-04 ENCOUNTER — Other Ambulatory Visit: Payer: Self-pay

## 2017-11-04 ENCOUNTER — Other Ambulatory Visit: Payer: Self-pay | Admitting: Interventional Cardiology

## 2017-11-04 ENCOUNTER — Ambulatory Visit: Payer: Medicare Other | Attending: Nurse Practitioner

## 2017-11-04 VITALS — BP 128/80 | HR 80 | Ht 69.0 in | Wt 274.8 lb

## 2017-11-04 DIAGNOSIS — R293 Abnormal posture: Secondary | ICD-10-CM | POA: Diagnosis present

## 2017-11-04 DIAGNOSIS — E785 Hyperlipidemia, unspecified: Secondary | ICD-10-CM

## 2017-11-04 DIAGNOSIS — E118 Type 2 diabetes mellitus with unspecified complications: Secondary | ICD-10-CM | POA: Diagnosis not present

## 2017-11-04 DIAGNOSIS — M6281 Muscle weakness (generalized): Secondary | ICD-10-CM | POA: Insufficient documentation

## 2017-11-04 DIAGNOSIS — R262 Difficulty in walking, not elsewhere classified: Secondary | ICD-10-CM | POA: Diagnosis present

## 2017-11-04 DIAGNOSIS — I5042 Chronic combined systolic (congestive) and diastolic (congestive) heart failure: Secondary | ICD-10-CM

## 2017-11-04 DIAGNOSIS — M62830 Muscle spasm of back: Secondary | ICD-10-CM | POA: Insufficient documentation

## 2017-11-04 DIAGNOSIS — M545 Low back pain: Secondary | ICD-10-CM | POA: Diagnosis not present

## 2017-11-04 DIAGNOSIS — I2510 Atherosclerotic heart disease of native coronary artery without angina pectoris: Secondary | ICD-10-CM | POA: Diagnosis not present

## 2017-11-04 DIAGNOSIS — I10 Essential (primary) hypertension: Secondary | ICD-10-CM | POA: Diagnosis not present

## 2017-11-04 DIAGNOSIS — G8929 Other chronic pain: Secondary | ICD-10-CM | POA: Diagnosis present

## 2017-11-04 LAB — COMPREHENSIVE METABOLIC PANEL
ALT: 29 IU/L (ref 0–44)
AST: 26 IU/L (ref 0–40)
Albumin/Globulin Ratio: 1.7 (ref 1.2–2.2)
Albumin: 4.3 g/dL (ref 3.6–4.8)
Alkaline Phosphatase: 102 IU/L (ref 39–117)
BUN/Creatinine Ratio: 13 (ref 10–24)
BUN: 13 mg/dL (ref 8–27)
Bilirubin Total: 0.3 mg/dL (ref 0.0–1.2)
CO2: 26 mmol/L (ref 20–29)
Calcium: 9.6 mg/dL (ref 8.6–10.2)
Chloride: 96 mmol/L (ref 96–106)
Creatinine, Ser: 1.03 mg/dL (ref 0.76–1.27)
GFR calc Af Amer: 90 mL/min/{1.73_m2} (ref >59)
GFR calc non Af Amer: 77 mL/min/{1.73_m2} (ref >59)
Globulin, Total: 2.5 g/dL (ref 1.5–4.5)
Glucose: 228 mg/dL — ABNORMAL HIGH (ref 65–99)
Potassium: 4 mmol/L (ref 3.5–5.2)
Sodium: 138 mmol/L (ref 134–144)
Total Protein: 6.8 g/dL (ref 6.0–8.5)

## 2017-11-04 LAB — PRO B NATRIURETIC PEPTIDE: NT-Pro BNP: 90 pg/mL (ref 0–210)

## 2017-11-04 MED ORDER — FUROSEMIDE 40 MG PO TABS: ORAL_TABLET | ORAL | 0 refills | Status: DC

## 2017-11-04 NOTE — Therapy (Signed)
[place], [place], [address] Phone: [phone]   Fax:  [phone]  Physical Therapy Evaluation  Patient Details  Name: Thomas Mcclure MRN: [id number] Date of Birth: 07-23-1955 Referring Provider: Glean Salvo, FNP   Encounter Date: 11/04/2017  PT End of Session - 11/04/17 1237    Visit Number  1    Number of Visits  24    Date for PT Re-Evaluation  01/29/18    Authorization Type  [employer] MCR    Authorization Time Period  KX at visit 15   progress note visit 10    PT Start Time  1015    PT Stop Time  1100    PT Time Calculation (min)  45 min    Activity Tolerance  Patient tolerated treatment well    Behavior During Therapy  [facility] for tasks assessed/performed       Past Medical History:  Diagnosis Date  . Baker's cyst    Left calf  . CAD in native artery, 07/15/15 PCI of RCA with DES 07/16/2015   a.   NSTEMI 5/17: LHC - pLAD 20, pLCx 20, OM1 30, pRCA 100, EF 45-50%>> PCI: 2.5 x 24 mm Promus DES to RCA  //  b.   Echo 5/17: mild LVH, EF 50-55%, no RWMA, mod RVE //  c. LHC 6/17: pLAD 20, pLCx 20, OM1 50, pRCA stent ok, EF 35-45% with mod sized inf wall and basal segment aneurysm  . Chronic back pain    "down my back, down my legs" (02/18/2017)  . Constipation   . GERD (gastroesophageal reflux disease)   . Hyperlipidemia   . Hypertension    Dr. [name] [phone]  . Ischemic cardiomyopathy    a. LV-gram at time of LHC in 6/17 with EF 35-45%  //  b. Echo 7/17: EF 45-50%, inferior HK, grade 1 diastolic dysfunction, mildly dilated aortic root, moderately reduced RVSF, mild RAE  . Myocardial infarction ([place]) 2017  . 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 Numbness and tingling of both lower extremities    "on the outside of both sides" (02/18/2017)  . Rheumatoid arthritis ([place])   . Type II diabetes mellitus ([place])    diet controlled    Past Surgical History:  Procedure Laterality  Date  . BACK SURGERY    . CARDIAC CATHETERIZATION N/A 07/15/2015   Procedure: Left Heart Cath and Coronary Angiography;  Surgeon: [name], MD;  Location: [facility];  Service: Cardiovascular;  Laterality: N/A;  . CARDIAC CATHETERIZATION N/A 07/15/2015   Procedure: Coronary Stent Intervention;  Surgeon: [name], MD;  Location: [facility];  Service: Cardiovascular;  Laterality: N/A;  . CARDIAC CATHETERIZATION N/A 08/07/2015   Procedure: Left Heart Cath and Coronary Angiography;  Surgeon: [name], MD;  Location: [facility];  Service: Cardiovascular;  Laterality: N/A;  . CORONARY ANGIOPLASTY WITH STENT PLACEMENT  07/2015  . JOINT REPLACEMENT    . LEFT HEART CATH AND CORONARY ANGIOGRAPHY N/A 02/19/2017   Procedure: LEFT HEART CATH AND CORONARY ANGIOGRAPHY;  Surgeon: Martinique, Peter M, MD;  Location: [facility];  Service: Cardiovascular;  Laterality: N/A;  . LUMBAR SPINE SURGERY  03/2009  & 2012  . MASS EXCISION N/A 04/19/2012   Procedure: removal of posterior cervical lipoma;  Surgeon: [name], MD;  Location: [facility];  Service: Neurosurgery;  Laterality: N/A;  Removal of posterior  cervical lipoma  . POPLITEAL SYNOVIAL CYST EXCISION Left    "opened up behind my knee; scraped out arthritis"  . SPINAL CORD STIMULATOR INSERTION N/A 01/07/2013   Procedure:  SPINAL CORD STIMULATOR INSERTION;  Surgeon: [name], MD;  Location: [facility];  Service: Neurosurgery;  Laterality: N/A;  . TONSILLECTOMY    . TOTAL HIP ARTHROPLASTY Right 10/03/2016   Procedure: TOTAL HIP ARTHROPLASTY;  Surgeon: [name], MD;  Location: [facility];  Service: Orthopedics;  Laterality: Right;  . WISDOM TOOTH EXTRACTION     hx of    There were no vitals filed for this visit.   Subjective Assessment - 11/04/17 1025    Subjective  He reports post back surgery 05/2017 with subsequent weakness and mobility issues.   He report LBP  and feels progressing more slowly  compared to after last back surgery.    Post hip sx was using SPC.      Patient is accompained by:  Family member   in lobby   Pertinent History  Back surgery x 3,  MI  with aneyurism,  RT THA  09/2016    Limitations  Walking;Standing;House hold activities    How long can you sit comfortably?  30 min max    How long can you stand comfortably?  10 min    How long can you walk comfortably?  1 block            ( after THA was able to walk 3 blocks)    Patient Stated Goals  To get legs stronger    Currently in Pain?  Yes    Pain Score  6     with meds   Pain Location  Back    Pain Orientation  Lower;Right;Left    Pain Descriptors / Indicators  Constant   hurt   Pain Type  Surgical pain    Pain Onset  More than a month ago    Pain Frequency  Constant    Aggravating Factors   lifting, stand from chair/bed, walking down steps,     Pain Relieving Factors  medication         [facility] PT Assessment - 11/04/17 0001      Assessment   Medical Diagnosis  weakness    Referring Provider  Glean Salvo, FNP    Onset Date/Surgical Date  05/27/17    Next MD Visit  next week sees surgeon    Prior Therapy  none post back surgery      Precautions   Precautions  Fall    Precaution Comments  lift no more than 8 pounds, limit bending and lifting        Restrictions   Weight Bearing Restrictions  No      Balance Screen   Has the patient fallen in the past 6 months  Yes    How many times?  3   walking up steps, legs gave out    Has the patient had a decrease in activity level because of a fear of falling?   Yes    Is the patient reluctant to leave their home because of a fear of falling?   No      Home Social worker  Private residence    Living Arrangements  Spouse/significant other    Available Help at Discharge  Family    Type of [place] to enter    Entrance Stairs-Number  of Steps  2    Entrance Stairs-Rails  Can reach both    Home Layout  One  level    [place] - 2 wheels;Cane - single point      Prior Function   Level of Independence  Needs assistance with ADLs      Cognition   Overall Cognitive Status  Within Functional Limits for tasks assessed      Observation/Other Assessments   Focus on Therapeutic Outcomes (FOTO)   60% limited      ROM / Strength   AROM / PROM / Strength  AROM;Strength;PROM      AROM   AROM Assessment Site  Lumbar    Lumbar Flexion  80    Lumbar Extension  -20    Lumbar - Right Side Bend  5    Lumbar - Left Side Bend  5      PROM   Overall PROM Comments  hip exten -15 degrees Rt    -5 LT       Strength   Overall Strength Comments  poor abdominals    Strength Assessment Site  Hip    Right/Left Hip  Right;Left    Right Hip Flexion  3+/5    Right Hip Extension  3+/5    Right Hip External Rotation   3+/5    Right Hip Internal Rotation  4/5    Right Hip ABduction  2+/5    Right Hip ADduction  4/5    Left Hip Flexion  4/5    Left Hip Extension  3/5    Left Hip External Rotation  4-/5    Left Hip Internal Rotation  4/5    Left Hip ABduction  4-/5    Left Hip ADduction  4/5      Flexibility   Soft Tissue Assessment /Muscle Length  yes    Hamstrings  60 degrees assisted bilaterally      Ambulation/Gait   Gait Comments  Walks with SPC , LOB but no fall coming in  to clinic      Standardized Balance Assessment   Standardized Balance Assessment  Berg Balance Test      Berg Balance Test   Sit to Stand  Able to stand  independently using hands    Standing Unsupported  Able to stand 2 minutes with supervision    Sitting with Back Unsupported but Feet Supported on Floor or Stool  Able to sit safely and securely 2 minutes    Stand to Sit  Controls descent by using hands    Transfers  Able to transfer safely, definite need of hands    Standing Unsupported with Eyes Closed  Needs help to keep from falling    Standing Ubsupported with Feet Together  Able to place feet together  independently but unable to hold for 30 seconds    From Standing, Reach Forward with Outstretched Arm  Can reach forward >5 cm safely (2")    From Standing Position, Pick up Object from Floor  Unable to pick up and needs supervision    From Standing Position, Turn to Look Behind Over each Shoulder  Turn sideways only but maintains balance    Turn 360 Degrees  Able to turn 360 degrees safely but slowly    Standing Unsupported, Alternately Place Feet on Step/Stool  Needs assistance to keep from falling or unable to try    Standing Unsupported, One Foot in Front  Needs help to step but can hold  15 seconds    Standing on One Leg  Tries to lift leg/unable to hold 3 seconds but remains standing independently    Total Score  27    Berg comment:  Discussed fall risk with pt and use of RW best choice                Objective measurements completed on examination: See above findings.              PT Education - 11/04/17 1237    Education Details  POC  BERG score and need for RW for decr risk of fall    Person(s) Educated  Patient    Methods  Explanation    Comprehension  Verbalized understanding       PT Short Term Goals - 11/04/17 1248      PT SHORT TERM GOAL #1   Title  He will be independent with all HEP issued.      Time  4    Period  Weeks    Status  New      PT SHORT TERM GOAL #2   Title  His BERG score will improve by 10 points to demo functional improvement    Time  4    Period  Weeks    Status  New      PT SHORT TERM GOAL #3   Title  He wil improve BERG score  to 35 points of better to demo incr balance    Time  4    Period  Weeks    Status  New        PT Long Term Goals - 11/04/17 1250      PT LONG TERM GOAL #1   Title  He will be independent  with  all HEP issued    Time  12    Period  Weeks    Status  New      PT LONG TERM GOAL #2   Title  He will improve Berg score above 40  so he is safe  walking with SPC    Time  12    Period  Weeks     Status  New      PT LONG TERM GOAL #3   Title  He will be able to walk 500 feet or more  to be  able to walk in community with no incr pain ( or 3 blocks per his perception) with SPC    Time  12    Period  Weeks    Status  New      PT LONG TERM GOAL #4   Title  He will improve Le strngth to 4?5 min all groups to improve stability and distance with walking    Time  12    Period  Weeks    Status  New      PT LONG TERM GOAL #5   Title  He will report greater ease walking up and down steps at home.      Time  12    Period  Weeks    Status  New      Additional Long Term Goals   Additional Long Term Goals  Yes      PT LONG TERM GOAL #6   Title  FOTO score will improve to 46% limited to demo perceived functional improvement    Time  12    Period  Weeks    Status  New  Plan - 11/04/17 1239    Clinical Impression Statement  MR Crofford presents 5 months post lumbar fusion  with pain, decreased strength , decr balance, stiffness of hips and back all making functional mobility difficult.  He is at high fall risk so asks him to use walker.     He appears to  be able to improve with PT but was significantly limited after THA and will have limtaitions post this POC    History and Personal Factors relevant to plan of care:  MI,  Lumbar SX   x 3 , RT THA    Clinical Presentation  Evolving    Clinical Presentation due to:  Weakness and pain post lumbar surgery    Clinical Decision Making  Moderate    Rehab Potential  Good    PT Frequency  2x / week    PT Duration  12 weeks    PT Treatment/Interventions  Moist Heat;Therapeutic exercise;Balance training;Therapeutic activities;Patient/family education;Manual techniques;Passive range of motion;Gait training    PT Next Visit Plan  Exercises for strength.  Manual for ROM.  Modalities  PRN     Consulted and Agree with Plan of Care  Patient       Patient will benefit from skilled therapeutic intervention in order to improve the  following deficits and impairments:  Pain, Postural dysfunction, Increased muscle spasms, Decreased activity tolerance, Decreased endurance, Decreased range of motion, Decreased strength, Decreased balance, Difficulty walking  Visit Diagnosis: Chronic bilateral low back pain, with sciatica presence unspecified  Muscle weakness (generalized)  Abnormal posture  Muscle spasm of back  Difficulty in walking, not elsewhere classified     Problem List Patient Active Problem List   Diagnosis Date Noted  . Radiculopathy of lumbar region 05/31/2017  . HCAP (healthcare-associated pneumonia) 05/30/2017  . Leukocytosis 05/30/2017  . Lumbar pseudoarthrosis 05/27/2017  . Unstable angina ([place]) 02/18/2017  . Primary localized osteoarthritis of right hip 10/03/2016  . Chest pain, rule out acute myocardial infarction 05/01/2016  . Diabetes mellitus type 2, controlled, with complications ([place]) 96/28/3662  . Obesity (BMI 30-39.9) 05/01/2016  . Left ventricular aneurysm 12/03/2015  . Ischemic cardiomyopathy   . CAD (coronary artery disease) 07/16/2015  . Hypokalemia 07/16/2015  . History of non-ST elevation myocardial infarction (NSTEMI) 07/15/2015  . Hypertension 07/15/2015  . Hyperlipidemia with target LDL less than 100 07/15/2015  . Chronic back pain 07/15/2015  . GERD (gastroesophageal reflux disease) 06/18/2012  . Chronic radicular low back pain 06/18/2012    Darrel Hoover  PT 11/04/2017, 1:01 PM  [facility] [address] [place], [place], [address] Phone: [phone]   Fax:  [phone]  Name: RAFIEL MECCA MRN: [id number] Date of Birth: May 30, 1955

## 2017-11-04 NOTE — Patient Instructions (Addendum)
Medication Instructions:  Your physician has recommended you make the following change in your medication:  Take furosemide 40 mg by mouth daily for next 3 days (Thurs, Fri and Sat).  Do not take chlorthalidone these days.  On Sunday resume normal Chlorthalidone dose.   Labwork: Lab work to be done today--BNP and CMET  Testing/Procedures: none  Follow-Up: Your physician wants you to follow-up in: 6 months with NP/PA on Dr.Smith's team and 12 months with [name]. You will receive a reminder letter in the mail two months in advance. If you don't receive a letter, please call our office to schedule the follow-up appointment.   Any Other Special Instructions Will Be Listed Below (If Applicable).     If you need a refill on your cardiac medications before your next appointment, please call your pharmacy.

## 2017-11-06 ENCOUNTER — Ambulatory Visit (INDEPENDENT_AMBULATORY_CARE_PROVIDER_SITE_OTHER): Payer: Medicare Other | Admitting: Family

## 2017-11-06 ENCOUNTER — Encounter: Payer: Self-pay | Admitting: Family

## 2017-11-06 ENCOUNTER — Other Ambulatory Visit (INDEPENDENT_AMBULATORY_CARE_PROVIDER_SITE_OTHER): Payer: Medicare Other

## 2017-11-06 VITALS — BP 132/78 | HR 85 | Temp 97.5°F | Ht 69.0 in | Wt 271.1 lb

## 2017-11-06 DIAGNOSIS — Z125 Encounter for screening for malignant neoplasm of prostate: Secondary | ICD-10-CM

## 2017-11-06 DIAGNOSIS — K219 Gastro-esophageal reflux disease without esophagitis: Secondary | ICD-10-CM | POA: Diagnosis not present

## 2017-11-06 DIAGNOSIS — E876 Hypokalemia: Secondary | ICD-10-CM

## 2017-11-06 DIAGNOSIS — I10 Essential (primary) hypertension: Secondary | ICD-10-CM

## 2017-11-06 DIAGNOSIS — E118 Type 2 diabetes mellitus with unspecified complications: Secondary | ICD-10-CM

## 2017-11-06 DIAGNOSIS — E785 Hyperlipidemia, unspecified: Secondary | ICD-10-CM

## 2017-11-06 DIAGNOSIS — Z23 Encounter for immunization: Secondary | ICD-10-CM

## 2017-11-06 DIAGNOSIS — Z1159 Encounter for screening for other viral diseases: Secondary | ICD-10-CM

## 2017-11-06 LAB — MICROALBUMIN / CREATININE URINE RATIO
Creatinine,U: 18.8 mg/dL
Microalb Creat Ratio: 3.7 mg/g (ref 0.0–30.0)
Microalb, Ur: <0.7 mg/dL (ref 0.0–1.9)

## 2017-11-06 LAB — MAGNESIUM: Magnesium: 2 mg/dL (ref 1.5–2.5)

## 2017-11-06 LAB — HEMOGLOBIN A1C: Hgb A1c MFr Bld: 8.7 % — ABNORMAL HIGH (ref 4.6–6.5)

## 2017-11-06 LAB — PSA: PSA: 0.32 ng/mL (ref 0.10–4.00)

## 2017-11-06 MED ORDER — POTASSIUM CHLORIDE CRYS ER 20 MEQ PO TBCR: 20.0000 meq | EXTENDED_RELEASE_TABLET | Freq: Every day | ORAL | 1 refills | Status: DC

## 2017-11-06 MED ORDER — CYCLOBENZAPRINE HCL 10 MG PO TABS: 10.0000 mg | ORAL_TABLET | Freq: Every day | ORAL | 0 refills | Status: DC | PRN

## 2017-11-06 MED ORDER — POLYETHYLENE GLYCOL 3350 17 GM/SCOOP PO POWD: 17.0000 g | Freq: Every day | ORAL | 0 refills | Status: DC

## 2017-11-06 MED ORDER — OMEPRAZOLE 40 MG PO CPDR: 40.0000 mg | DELAYED_RELEASE_CAPSULE | Freq: Every day | ORAL | 0 refills | Status: DC

## 2017-11-06 MED ORDER — PREGABALIN 100 MG PO CAPS: 100.0000 mg | ORAL_CAPSULE | Freq: Every day | ORAL | 0 refills | Status: DC

## 2017-11-06 MED ORDER — DULOXETINE HCL 60 MG PO CPEP: 60.0000 mg | ORAL_CAPSULE | Freq: Every day | ORAL | 0 refills | Status: DC

## 2017-11-06 NOTE — Progress Notes (Signed)
Thomas Mcclure is a 62 y.o. male with the following history as recorded in EpicCare:  Patient Active Problem List   Diagnosis Date Noted  . Radiculopathy of lumbar region 05/31/2017  . HCAP (healthcare-associated pneumonia) 05/30/2017  . Leukocytosis 05/30/2017  . Lumbar pseudoarthrosis 05/27/2017  . Unstable angina ([place]) 02/18/2017  . Primary localized osteoarthritis of right hip 10/03/2016  . Chest pain, rule out acute myocardial infarction 05/01/2016  . Diabetes mellitus type 2, controlled, with complications ([place]) 16/11/9602  . Obesity (BMI 30-39.9) 05/01/2016  . Left ventricular aneurysm 12/03/2015  . Ischemic cardiomyopathy   . CAD (coronary artery disease) 07/16/2015  . Hypokalemia 07/16/2015  . History of non-ST elevation myocardial infarction (NSTEMI) 07/15/2015  . Hypertension 07/15/2015  . Hyperlipidemia with target LDL less than 100 07/15/2015  . Chronic back pain 07/15/2015  . GERD (gastroesophageal reflux disease) 06/18/2012  . Chronic radicular low back pain 06/18/2012    Current Outpatient Medications  Medication Sig Dispense Refill  . aspirin EC 81 MG tablet Take 1 tablet (81 mg total) by mouth daily. 30 tablet 1  . atorvastatin (LIPITOR) 80 MG tablet Take 80 mg by mouth daily at 6 PM.    . chlorthalidone (HYGROTON) 25 MG tablet Take 1 tablet (25 mg total) by mouth daily. 90 tablet 2  . cyclobenzaprine (FLEXERIL) 10 MG tablet Take 1 tablet (10 mg total) by mouth daily as needed for muscle spasms. 90 tablet 0  . diazepam (VALIUM) 5 MG tablet Take 3 tablets by mouth daily.  0  . DILT-XR 180 MG 24 hr capsule Take 180 mg by mouth once daily  4  . DULoxetine (CYMBALTA) 60 MG capsule Take 1 capsule (60 mg total) by mouth daily. 90 capsule 0  . furosemide (LASIX) 40 MG tablet Take one tablet by mouth daily for 3 days 3 tablet 0  . lisinopril (PRINIVIL,ZESTRIL) 20 MG tablet Take 20 mg by mouth at bedtime.   3  . metFORMIN (GLUCOPHAGE-XR) 750 MG 24 hr tablet Take 750 mg  by mouth at bedtime.     . Multiple Vitamin (MULTIVITAMIN WITH MINERALS) TABS tablet Take 1 tablet by mouth daily.    . nitroGLYCERIN (NITROSTAT) 0.4 MG SL tablet Place 0.4 mg under the tongue every 5 (five) minutes as needed for chest pain.    Marland Kitchen omeprazole (PRILOSEC) 40 MG capsule Take 1 capsule (40 mg total) by mouth daily. 90 capsule 0  . oxyCODONE ER (XTAMPZA ER) 27 MG C12A Take 27 mg by mouth every 12 (twelve) hours. 60 each 0  . polyethylene glycol powder (GLYCOLAX/MIRALAX) powder Take 17 g by mouth daily. Mix in 8 oz water, juice, soda, coffee or tea and drink 250 g 0  . potassium chloride SA (K-DUR,KLOR-CON) 20 MEQ tablet Take 1 tablet (20 mEq total) by mouth daily. 90 tablet 1  . pregabalin (LYRICA) 100 MG capsule Take 1-2 capsules (100-200 mg total) by mouth daily. Take 100 mg in the morning and 200 mg at night 270 capsule 0  . senna-docusate (SENOKOT S) 8.6-50 MG tablet Take 1 tablet by mouth 2 (two) times daily.     No current facility-administered medications for this visit.     Allergies: Brilinta [ticagrelor]; Methylprednisolone; Prednisone; Toradol [ketorolac tromethamine]; and Adhesive [tape]  Past Medical History:  Diagnosis Date  . Baker's cyst    Left calf  . CAD in native artery, 07/15/15 PCI of RCA with DES 07/16/2015   a.   NSTEMI 5/17: LHC - pLAD 20, pLCx  20, OM1 30, pRCA 100, EF 45-50%>> PCI: 2.5 x 24 mm Promus DES to RCA  //  b.   Echo 5/17: mild LVH, EF 50-55%, no RWMA, mod RVE //  c. LHC 6/17: pLAD 20, pLCx 20, OM1 50, pRCA stent ok, EF 35-45% with mod sized inf wall and basal segment aneurysm  . Chronic back pain    "down my back, down my legs" (02/18/2017)  . Constipation   . GERD (gastroesophageal reflux disease)   . Hyperlipidemia   . Hypertension    Dr. [name] [phone]  . Ischemic cardiomyopathy    a. LV-gram at time of LHC in 6/17 with EF 35-45%  //  b. Echo 7/17: EF 45-50%, inferior HK, grade 1 diastolic dysfunction, mildly dilated aortic root,  moderately reduced RVSF, mild RAE  . Myocardial infarction ([place]) 2017  . 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 Numbness and tingling of both lower extremities    "on the outside of both sides" (02/18/2017)  . Rheumatoid arthritis ([place])   . Type II diabetes mellitus ([place])    diet controlled    Past Surgical History:  Procedure Laterality Date  . BACK SURGERY    . CARDIAC CATHETERIZATION N/A 07/15/2015   Procedure: Left Heart Cath and Coronary Angiography;  Surgeon: [name], MD;  Location: [facility];  Service: Cardiovascular;  Laterality: N/A;  . CARDIAC CATHETERIZATION N/A 07/15/2015   Procedure: Coronary Stent Intervention;  Surgeon: [name], MD;  Location: [place] [facility];  Service: Cardiovascular;  Laterality: N/A;  . CARDIAC CATHETERIZATION N/A 08/07/2015   Procedure: Left Heart Cath and Coronary Angiography;  Surgeon: [name], MD;  Location: [facility];  Service: Cardiovascular;  Laterality: N/A;  . CORONARY ANGIOPLASTY WITH STENT PLACEMENT  07/2015  . JOINT REPLACEMENT    . LEFT HEART CATH AND CORONARY ANGIOGRAPHY N/A 02/19/2017   Procedure: LEFT HEART CATH AND CORONARY ANGIOGRAPHY;  Surgeon: Martinique, Peter M, MD;  Location: [facility];  Service: Cardiovascular;  Laterality: N/A;  . LUMBAR SPINE SURGERY  03/2009  & 2012  . MASS EXCISION N/A 04/19/2012   Procedure: removal of posterior cervical lipoma;  Surgeon: [name], MD;  Location: [facility];  Service: Neurosurgery;  Laterality: N/A;  Removal of posterior cervical lipoma  . POPLITEAL SYNOVIAL CYST EXCISION Left    "opened up behind my knee; scraped out arthritis"  . SPINAL CORD STIMULATOR INSERTION N/A 01/07/2013   Procedure:  SPINAL CORD STIMULATOR INSERTION;  Surgeon: [name], MD;  Location: [facility];  Service: Neurosurgery;  Laterality: N/A;  . TONSILLECTOMY    . TOTAL HIP ARTHROPLASTY Right 10/03/2016   Procedure: TOTAL HIP ARTHROPLASTY;   Surgeon: [name], MD;  Location: [facility];  Service: Orthopedics;  Laterality: Right;  . WISDOM TOOTH EXTRACTION     hx of    Family History  Problem Relation Age of Onset  . Heart disease Mother   . Prostate cancer Brother     Social History   Tobacco Use  . Smoking status: Former Smoker    Packs/day: 0.25    Years: 40.00    Pack years: 10.00    Types: Cigarettes  . Smokeless tobacco: Never Used  . Tobacco comment: 02/18/2017 "quit in 2012"  Substance Use Topics  . Alcohol use: No    Alcohol/week: 0.0 standard drinks    Subjective:  Patient presents today as a new  patient:  1) Chronic pain in back/ under care of pain management; does have neurostimulator in place; sees pain management once monthly; in process of tapering off Valium; needs refills on Cymbalta and Lyrica;  2) History of CAD/ MI in 2017- sees cardiology every 6 months; ( Dr. Daneen Schick); 3) GERD- stable on Prilosec; 4) Hypertension- stable on Lisinopril and Chlorthalidone; 5) Arthritis in knees- Dr. Noemi Chapel for gel shots   Objective:  Vitals:   11/06/17 0923  BP: 132/78  Pulse: 85  Temp: (!) 97.5 F (36.4 C)  TempSrc: Oral  SpO2: 96%  Weight: 271 lb 1.9 oz (123 kg)  Height: 5\' 9"  (1.753 m)    General: Well developed, well nourished, in no acute distress  Skin : Warm and dry.  Head: Normocephalic and atraumatic  Lungs: Respirations unlabored; clear to auscultation bilaterally without wheeze, rales, rhonchi  CVS exam: normal rate and regular rhythm.  Musculoskeletal: No deformities; no active joint inflammation  Neurologic: Alert and oriented; speech intact; face symmetrical; uses cane; CNII-XII intact without focal deficit  Assessment:  1. Controlled type 2 diabetes mellitus with complication, without long-term current use of insulin ([place])   2. Gastroesophageal reflux disease, esophagitis presence not specified   3. Essential hypertension   4. Hypokalemia   5. Hyperlipidemia with target LDL  less than 100   6. Need for hepatitis C screening test   7. Prostate cancer screening     Plan:  1. Reviewed CMP yesterday; update Hgba1c, urine microalbumin today; plan for Prevnar at next OV; follow up in 3 months, sooner prn. 2. Stable; refill on Omeprazole; update magnesium; 3. Stable; continue same medications; refills updated; 4. Stable; potassium was checked yesterday- normal; refill updated; 5. Stable; continue Atorvastatin daily; 6. Update Hep C screen; 7. Check PSA today;   Return in about 3 months (around 02/05/2018).  Orders Placed This Encounter  Procedures  . HgB A1c    Standing Status:   Future    Standing Expiration Date:   11/06/2018  . Magnesium    Standing Status:   Future    Standing Expiration Date:   11/06/2018  . Hepatitis C Antibody    Standing Status:   Future    Standing Expiration Date:   11/06/2018  . Urine Microalbumin w/creat. ratio    Standing Status:   Future    Standing Expiration Date:   11/06/2018  . PSA    Standing Status:   Future    Standing Expiration Date:   11/06/2018    Requested Prescriptions   Signed Prescriptions Disp Refills  . cyclobenzaprine (FLEXERIL) 10 MG tablet 90 tablet 0    Sig: Take 1 tablet (10 mg total) by mouth daily as needed for muscle spasms.  . DULoxetine (CYMBALTA) 60 MG capsule 90 capsule 0    Sig: Take 1 capsule (60 mg total) by mouth daily.  Marland Kitchen omeprazole (PRILOSEC) 40 MG capsule 90 capsule 0    Sig: Take 1 capsule (40 mg total) by mouth daily.  . potassium chloride SA (K-DUR,KLOR-CON) 20 MEQ tablet 90 tablet 1    Sig: Take 1 tablet (20 mEq total) by mouth daily.  . polyethylene glycol powder (GLYCOLAX/MIRALAX) powder 250 g 0    Sig: Take 17 g by mouth daily. Mix in 8 oz water, juice, soda, coffee or tea and drink  . pregabalin (LYRICA) 100 MG capsule 270 capsule 0    Sig: Take 1-2 capsules (100-200 mg total) by mouth daily. Take 100 mg in  the morning and 200 mg at night

## 2017-11-06 NOTE — Addendum Note (Signed)
Addended by: Marcina Millard on: 11/06/2017 10:17 AM   Modules accepted: Orders

## 2017-11-07 LAB — HEPATITIS C ANTIBODY
Hepatitis C Ab: NONREACTIVE
SIGNAL TO CUT-OFF: 0.03 (ref <1.00)

## 2017-11-09 ENCOUNTER — Ambulatory Visit: Payer: Medicare Other

## 2017-11-09 ENCOUNTER — Other Ambulatory Visit: Payer: Self-pay | Admitting: Family

## 2017-11-09 DIAGNOSIS — M62830 Muscle spasm of back: Secondary | ICD-10-CM

## 2017-11-09 DIAGNOSIS — M6281 Muscle weakness (generalized): Secondary | ICD-10-CM

## 2017-11-09 DIAGNOSIS — M545 Low back pain: Secondary | ICD-10-CM | POA: Diagnosis not present

## 2017-11-09 DIAGNOSIS — R293 Abnormal posture: Secondary | ICD-10-CM

## 2017-11-09 DIAGNOSIS — G8929 Other chronic pain: Secondary | ICD-10-CM

## 2017-11-09 DIAGNOSIS — R262 Difficulty in walking, not elsewhere classified: Secondary | ICD-10-CM

## 2017-11-09 MED ORDER — METFORMIN HCL ER 500 MG PO TB24: 1000.0000 mg | ORAL_TABLET | Freq: Two times a day (BID) | ORAL | 0 refills | Status: DC

## 2017-11-09 NOTE — Therapy (Signed)
[place], [place], [address] Phone: [phone]   Fax:  [phone]  Physical Therapy Treatment  Patient Details  Name: Thomas Mcclure MRN: [id number] Date of Birth: 01/11/1956 Referring Provider: Glean Salvo, FNP   Encounter Date: 11/09/2017  PT End of Session - 11/09/17 1055    Visit Number  2    Number of Visits  24    Date for PT Re-Evaluation  01/29/18    Authorization Type  [employer] MCR    Authorization Time Period  KX at visit 15   progress note visit 10    PT Start Time  1055    PT Stop Time  1145    PT Time Calculation (min)  50 min    Activity Tolerance  Patient tolerated treatment well    Behavior During Therapy  [facility] for tasks assessed/performed       Past Medical History:  Diagnosis Date  . Baker's cyst    Left calf  . CAD in native artery, 07/15/15 PCI of RCA with DES 07/16/2015   a.   NSTEMI 5/17: LHC - pLAD 20, pLCx 20, OM1 30, pRCA 100, EF 45-50%>> PCI: 2.5 x 24 mm Promus DES to RCA  //  b.   Echo 5/17: mild LVH, EF 50-55%, no RWMA, mod RVE //  c. LHC 6/17: pLAD 20, pLCx 20, OM1 50, pRCA stent ok, EF 35-45% with mod sized inf wall and basal segment aneurysm  . Chronic back pain    "down my back, down my legs" (02/18/2017)  . Constipation   . GERD (gastroesophageal reflux disease)   . Hyperlipidemia   . Hypertension    Dr. [name] [phone]  . Ischemic cardiomyopathy    a. LV-gram at time of LHC in 6/17 with EF 35-45%  //  b. Echo 7/17: EF 45-50%, inferior HK, grade 1 diastolic dysfunction, mildly dilated aortic root, moderately reduced RVSF, mild RAE  . Myocardial infarction ([place]) 2017  . 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 Numbness and tingling of both lower extremities    "on the outside of both sides" (02/18/2017)  . Rheumatoid arthritis ([place])   . Type II diabetes mellitus ([place])    diet controlled    Past Surgical History:  Procedure Laterality  Date  . BACK SURGERY    . CARDIAC CATHETERIZATION N/A 07/15/2015   Procedure: Left Heart Cath and Coronary Angiography;  Surgeon: [name], MD;  Location: [facility];  Service: Cardiovascular;  Laterality: N/A;  . CARDIAC CATHETERIZATION N/A 07/15/2015   Procedure: Coronary Stent Intervention;  Surgeon: [name], MD;  Location: [facility];  Service: Cardiovascular;  Laterality: N/A;  . CARDIAC CATHETERIZATION N/A 08/07/2015   Procedure: Left Heart Cath and Coronary Angiography;  Surgeon: [name], MD;  Location: [facility];  Service: Cardiovascular;  Laterality: N/A;  . CORONARY ANGIOPLASTY WITH STENT PLACEMENT  07/2015  . JOINT REPLACEMENT    . LEFT HEART CATH AND CORONARY ANGIOGRAPHY N/A 02/19/2017   Procedure: LEFT HEART CATH AND CORONARY ANGIOGRAPHY;  Surgeon: Martinique, Peter M, MD;  Location: [facility];  Service: Cardiovascular;  Laterality: N/A;  . LUMBAR SPINE SURGERY  03/2009  & 2012  . MASS EXCISION N/A 04/19/2012   Procedure: removal of posterior cervical lipoma;  Surgeon: [name], MD;  Location: [facility];  Service: Neurosurgery;  Laterality: N/A;  Removal of posterior  cervical lipoma  . POPLITEAL SYNOVIAL CYST EXCISION Left    "opened up behind my knee; scraped out arthritis"  . SPINAL CORD STIMULATOR INSERTION N/A 01/07/2013   Procedure:  SPINAL CORD STIMULATOR INSERTION;  Surgeon: [name], MD;  Location: [facility];  Service: Neurosurgery;  Laterality: N/A;  . TONSILLECTOMY    . TOTAL HIP ARTHROPLASTY Right 10/03/2016   Procedure: TOTAL HIP ARTHROPLASTY;  Surgeon: [name], MD;  Location: [facility];  Service: Orthopedics;  Laterality: Right;  . WISDOM TOOTH EXTRACTION     hx of    There were no vitals filed for this visit.  Subjective Assessment - 11/09/17 1101    Subjective  MD put me on fluid pills so have lost 12 pounds of fluid.    no pain since took meds.     Currently in Pain?  No/denies                        [facility] Adult PT Treatment/Exercise - 11/09/17 0001      Exercises   Exercises  Knee/Hip      Knee/Hip Exercises: Aerobic   Nustep  L4 6 min LE only      Knee/Hip Exercises: Standing   Heel Raises  Both;15 reps    Hip Flexion  Right;Left;15 reps    Hip Abduction  Right;Left;15 reps    Functional Squat  15 reps   holding on to hip machine     Knee/Hip Exercises: Seated   Long Arc Quad  Left;Right;15 reps    Long Arc Quad Limitations  5 sec hold      Knee/Hip Exercises: Supine   Bridges  Both;10 reps    Other Supine Knee/Hip Exercises  bent knee raises x 10 RT/LT , x10 PPT,  hip clams x 10 , LTR x 10 RT/LT      Knee/Hip Exercises: Sidelying   Hip ABduction  Right;Left;10 reps    Clams  RT/ L:T x 12 each               PT Short Term Goals - 11/04/17 1248      PT SHORT TERM GOAL #1   Title  He will be independent with all HEP issued.      Time  4    Period  Weeks    Status  New      PT SHORT TERM GOAL #2   Title  His BERG score will improve by 10 points to demo functional improvement    Time  4    Period  Weeks    Status  New      PT SHORT TERM GOAL #3   Title  He wil improve BERG score  to 35 points of better to demo incr balance    Time  4    Period  Weeks    Status  New        PT Long Term Goals - 11/04/17 1250      PT LONG TERM GOAL #1   Title  He will be independent  with  all HEP issued    Time  12    Period  Weeks    Status  New      PT LONG TERM GOAL #2   Title  He will improve Berg score above 40  so he is safe  walking with SPC    Time  12    Period  Weeks  Status  New      PT LONG TERM GOAL #3   Title  He will be able to walk 500 feet or more  to be  able to walk in community with no incr pain ( or 3 blocks per his perception) with SPC    Time  12    Period  Weeks    Status  New      PT LONG TERM GOAL #4   Title  He will improve Le strngth to 4?5 min all groups to improve stability and distance  with walking    Time  12    Period  Weeks    Status  New      PT LONG TERM GOAL #5   Title  He will report greater ease walking up and down steps at home.      Time  12    Period  Weeks    Status  New      Additional Long Term Goals   Additional Long Term Goals  Yes      PT LONG TERM GOAL #6   Title  FOTO score will improve to 46% limited to demo perceived functional improvement    Time  12    Period  Weeks    Status  New            Plan - 11/09/17 1056    Clinical Impression Statement  Doing well with less pain. No problems over weekend.  Did well with all exercidses If he is not overly sore from today initiate HEP next session    PT Treatment/Interventions  Moist Heat;Therapeutic exercise;Balance training;Therapeutic activities;Patient/family education;Manual techniques;Passive range of motion;Gait training    PT Next Visit Plan  Exercises for strength.  Manual for ROM.  Modalities  PRN     Consulted and Agree with Plan of Care  Patient       Patient will benefit from skilled therapeutic intervention in order to improve the following deficits and impairments:  Pain, Postural dysfunction, Increased muscle spasms, Decreased activity tolerance, Decreased endurance, Decreased range of motion, Decreased strength, Decreased balance, Difficulty walking  Visit Diagnosis: Chronic bilateral low back pain, with sciatica presence unspecified  Muscle weakness (generalized)  Abnormal posture  Muscle spasm of back  Difficulty in walking, not elsewhere classified     Problem List Patient Active Problem List   Diagnosis Date Noted  . Radiculopathy of lumbar region 05/31/2017  . HCAP (healthcare-associated pneumonia) 05/30/2017  . Leukocytosis 05/30/2017  . Lumbar pseudoarthrosis 05/27/2017  . Unstable angina ([place]) 02/18/2017  . Primary localized osteoarthritis of right hip 10/03/2016  . Chest pain, rule out acute myocardial infarction 05/01/2016  . Diabetes mellitus type  2, controlled, with complications ([place]) 65/99/3570  . Obesity (BMI 30-39.9) 05/01/2016  . Left ventricular aneurysm 12/03/2015  . Ischemic cardiomyopathy   . CAD (coronary artery disease) 07/16/2015  . Hypokalemia 07/16/2015  . History of non-ST elevation myocardial infarction (NSTEMI) 07/15/2015  . Hypertension 07/15/2015  . Hyperlipidemia with target LDL less than 100 07/15/2015  . Chronic back pain 07/15/2015  . GERD (gastroesophageal reflux disease) 06/18/2012  . Chronic radicular low back pain 06/18/2012    Darrel Hoover  PT 11/09/2017, 11:42 AM  [facility] [address] [place], [place], [address] Phone: [phone]   Fax:  [phone]  Name: ELENA COTHERN MRN: [id number] Date of Birth: September 17, 1955

## 2017-11-10 ENCOUNTER — Ambulatory Visit: Payer: Medicare Other | Admitting: Physical Therapy

## 2017-11-11 ENCOUNTER — Encounter: Payer: Medicare Other | Admitting: Physical Therapy

## 2017-11-12 ENCOUNTER — Encounter: Payer: Medicare Other | Attending: Physical Medicine & Rehabilitation | Admitting: Registered Nurse

## 2017-11-12 ENCOUNTER — Encounter: Payer: Self-pay | Admitting: Registered Nurse

## 2017-11-12 VITALS — BP 116/83 | HR 97 | Resp 14 | Ht 69.0 in | Wt 271.0 lb

## 2017-11-12 DIAGNOSIS — Z79891 Long term (current) use of opiate analgesic: Secondary | ICD-10-CM | POA: Diagnosis not present

## 2017-11-12 DIAGNOSIS — M961 Postlaminectomy syndrome, not elsewhere classified: Secondary | ICD-10-CM

## 2017-11-12 DIAGNOSIS — E119 Type 2 diabetes mellitus without complications: Secondary | ICD-10-CM | POA: Insufficient documentation

## 2017-11-12 DIAGNOSIS — Z5181 Encounter for therapeutic drug level monitoring: Secondary | ICD-10-CM

## 2017-11-12 DIAGNOSIS — M25551 Pain in right hip: Secondary | ICD-10-CM

## 2017-11-12 DIAGNOSIS — M069 Rheumatoid arthritis, unspecified: Secondary | ICD-10-CM | POA: Insufficient documentation

## 2017-11-12 DIAGNOSIS — K219 Gastro-esophageal reflux disease without esophagitis: Secondary | ICD-10-CM | POA: Insufficient documentation

## 2017-11-12 DIAGNOSIS — I2510 Atherosclerotic heart disease of native coronary artery without angina pectoris: Secondary | ICD-10-CM | POA: Insufficient documentation

## 2017-11-12 DIAGNOSIS — Z981 Arthrodesis status: Secondary | ICD-10-CM | POA: Insufficient documentation

## 2017-11-12 DIAGNOSIS — I252 Old myocardial infarction: Secondary | ICD-10-CM | POA: Insufficient documentation

## 2017-11-12 DIAGNOSIS — G8928 Other chronic postprocedural pain: Secondary | ICD-10-CM | POA: Insufficient documentation

## 2017-11-12 DIAGNOSIS — Z9689 Presence of other specified functional implants: Secondary | ICD-10-CM | POA: Diagnosis not present

## 2017-11-12 DIAGNOSIS — Z79899 Other long term (current) drug therapy: Secondary | ICD-10-CM | POA: Insufficient documentation

## 2017-11-12 DIAGNOSIS — Z96641 Presence of right artificial hip joint: Secondary | ICD-10-CM | POA: Insufficient documentation

## 2017-11-12 DIAGNOSIS — M25552 Pain in left hip: Secondary | ICD-10-CM

## 2017-11-12 DIAGNOSIS — Z8042 Family history of malignant neoplasm of prostate: Secondary | ICD-10-CM | POA: Diagnosis not present

## 2017-11-12 DIAGNOSIS — M5416 Radiculopathy, lumbar region: Secondary | ICD-10-CM | POA: Diagnosis not present

## 2017-11-12 DIAGNOSIS — E785 Hyperlipidemia, unspecified: Secondary | ICD-10-CM | POA: Insufficient documentation

## 2017-11-12 DIAGNOSIS — M545 Low back pain: Secondary | ICD-10-CM | POA: Insufficient documentation

## 2017-11-12 DIAGNOSIS — Z955 Presence of coronary angioplasty implant and graft: Secondary | ICD-10-CM | POA: Diagnosis not present

## 2017-11-12 DIAGNOSIS — G8929 Other chronic pain: Secondary | ICD-10-CM | POA: Diagnosis present

## 2017-11-12 DIAGNOSIS — I10 Essential (primary) hypertension: Secondary | ICD-10-CM | POA: Diagnosis not present

## 2017-11-12 DIAGNOSIS — Z87891 Personal history of nicotine dependence: Secondary | ICD-10-CM | POA: Insufficient documentation

## 2017-11-12 DIAGNOSIS — I255 Ischemic cardiomyopathy: Secondary | ICD-10-CM | POA: Insufficient documentation

## 2017-11-12 DIAGNOSIS — Z8249 Family history of ischemic heart disease and other diseases of the circulatory system: Secondary | ICD-10-CM | POA: Insufficient documentation

## 2017-11-12 DIAGNOSIS — G894 Chronic pain syndrome: Secondary | ICD-10-CM | POA: Diagnosis not present

## 2017-11-12 MED ORDER — OXYCODONE ER 27 MG PO C12A: 27.0000 mg | EXTENDED_RELEASE_CAPSULE | Freq: Two times a day (BID) | ORAL | 0 refills | Status: DC

## 2017-11-12 MED ORDER — OXYCODONE HCL 5 MG PO TABS: 5.0000 mg | ORAL_TABLET | Freq: Every day | ORAL | 0 refills | Status: DC | PRN

## 2017-11-12 NOTE — Progress Notes (Signed)
Subjective:    Patient ID: Thomas Mcclure, male    DOB: 1955-04-10, 62 y.o.   MRN: [id number]  HPI: Thomas Mcclure is a 62 year old male who returns for follow up appointment for chronic pain and medication refill. He states his pain is located in his lower back radiating into his bilateral lower extremities and bilateral hip pain. He rates his pain 8. Also reports the Xtampza only gives him 8 hours of relief, medications were reviewed. We will prescribe Oxycodone 5mg  IR for breakthrough pain, this was discussed with Dr. Letta Pate and he agrees with plan. His current exercise regime is walking and attending physical therapy.   Thomas Mcclure Morphine Equivalent is 81.00 MME. Last UDS was Performed on 10/15/2017, it was consistent.   Pain Inventory Average Pain 9 Pain Right Now 8 My pain is constant, sharp, tingling and aching  In the last 24 hours, has pain interfered with the following? General activity 9 Relation with others 4 Enjoyment of life 8 What TIME of day is your pain at its worst? daytime Sleep (in general) Fair  Pain is worse with: walking, bending, sitting and standing Pain improves with: rest, therapy/exercise and medication Relief from Meds: 9  Mobility walk with assistance use a cane use a walker how many minutes can you walk? 3 ability to climb steps?  no do you drive?  yes  Function disabled: date disabled .  Neuro/Psych weakness numbness tingling trouble walking spasms  Prior Studies Any changes since last visit?  no  Physicians involved in your care Any changes since last visit?  no   Family History  Problem Relation Age of Onset  . Heart disease Mother   . Prostate cancer Brother    Social History   Socioeconomic History  . Marital status: Married    Spouse name: Not on file  . Number of children: 5  . Years of education: Not on file  . Highest education level: Not on file  Occupational History  . Occupation: Film/video editor, now on disability due to back pain  Social Needs  . Financial resource strain: Not on file  . Food insecurity:    Worry: Not on file    Inability: Not on file  . Transportation needs:    Medical: Not on file    Non-medical: Not on file  Tobacco Use  . Smoking status: Former Smoker    Packs/day: 0.25    Years: 40.00    Pack years: 10.00    Types: Cigarettes  . Smokeless tobacco: Never Used  . Tobacco comment: 02/18/2017 "quit in 2012"  Substance and Sexual Activity  . Alcohol use: No    Alcohol/week: 0.0 standard drinks  . Drug use: No  . Sexual activity: Not Currently  Lifestyle  . Physical activity:    Days per week: Not on file    Minutes per session: Not on file  . Stress: Not on file  Relationships  . Social connections:    Talks on phone: Not on file    Gets together: Not on file    Attends religious service: Not on file    Active member of club or organization: Not on file    Attends meetings of clubs or organizations: Not on file    Relationship status: Not on file  Other Topics Concern  . Not on file  Social History Narrative  . Not on file   Past Surgical History:  Procedure Laterality Date  .  BACK SURGERY    . CARDIAC CATHETERIZATION N/A 07/15/2015   Procedure: Left Heart Cath and Coronary Angiography;  Surgeon: [name], MD;  Location: [facility];  Service: Cardiovascular;  Laterality: N/A;  . CARDIAC CATHETERIZATION N/A 07/15/2015   Procedure: Coronary Stent Intervention;  Surgeon: [name], MD;  Location: [facility];  Service: Cardiovascular;  Laterality: N/A;  . CARDIAC CATHETERIZATION N/A 08/07/2015   Procedure: Left Heart Cath and Coronary Angiography;  Surgeon: [name], MD;  Location: [facility];  Service: Cardiovascular;  Laterality: N/A;  . CORONARY ANGIOPLASTY WITH STENT PLACEMENT  07/2015  . JOINT REPLACEMENT    . LEFT HEART CATH AND CORONARY ANGIOGRAPHY N/A 02/19/2017   Procedure: LEFT HEART CATH  AND CORONARY ANGIOGRAPHY;  Surgeon: Martinique, Peter M, MD;  Location: [facility];  Service: Cardiovascular;  Laterality: N/A;  . LUMBAR SPINE SURGERY  03/2009  & 2012  . MASS EXCISION N/A 04/19/2012   Procedure: removal of posterior cervical lipoma;  Surgeon: [name], MD;  Location: [facility];  Service: Neurosurgery;  Laterality: N/A;  Removal of posterior cervical lipoma  . POPLITEAL SYNOVIAL CYST EXCISION Left    "opened up behind my knee; scraped out arthritis"  . SPINAL CORD STIMULATOR INSERTION N/A 01/07/2013   Procedure:  SPINAL CORD STIMULATOR INSERTION;  Surgeon: [name], MD;  [facility]: [facility];  Service: Neurosurgery;  Laterality: N/A;  . TONSILLECTOMY    . TOTAL HIP ARTHROPLASTY Right 10/03/2016   Procedure: TOTAL HIP ARTHROPLASTY;  Surgeon: [name], MD;  Location: [facility];  Service: Orthopedics;  Laterality: Right;  . WISDOM TOOTH EXTRACTION     hx of   Past Medical History:  Diagnosis Date  . Baker's cyst    Left calf  . CAD in native artery, 07/15/15 PCI of RCA with DES 07/16/2015   a.   NSTEMI 5/17: LHC - pLAD 20, pLCx 20, OM1 30, pRCA 100, EF 45-50%>> PCI: 2.5 x 24 mm Promus DES to RCA  //  b.   Echo 5/17: mild LVH, EF 50-55%, no RWMA, mod RVE //  c. LHC 6/17: pLAD 20, pLCx 20, OM1 50, pRCA stent ok, EF 35-45% with mod sized inf wall and basal segment aneurysm  . Chronic back pain    "down my back, down my legs" (02/18/2017)  . Constipation   . GERD (gastroesophageal reflux disease)   . Hyperlipidemia   . Hypertension    Dr. [name] [phone]  . Ischemic cardiomyopathy    a. LV-gram at time of LHC in 6/17 with EF 35-45%  //  b. Echo 7/17: EF 45-50%, inferior HK, grade 1 diastolic dysfunction, mildly dilated aortic root, moderately reduced RVSF, mild RAE  . Myocardial infarction ([place]) 2017  . 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 Numbness and tingling of both lower extremities    "on the outside of both sides"  (02/18/2017)  . Rheumatoid arthritis ([place])   . Type II diabetes mellitus (H[facility])    diet controlled   BP 116/83 (BP Location: Right Arm, Patient Position: Sitting, Cuff Size: Large)   Pulse 97   Resp 14   Ht 5\' 9"  (1.753 m)   Wt 271 lb (122.9 kg)   SpO2 90%   BMI 40.02 kg/m   Opioid Risk Score:   Fall Risk Score:  `1  Depression screen PHQ 2/9  Depression screen [facility] 2/9 10/15/2017 01/28/2016 11/12/2015  Decreased Interest 1 0 0  Down, Depressed, Hopeless 0 0 0  PHQ - 2 Score 1 0 0  Altered sleeping 1 - -  Tired, decreased energy 1 - -  Change in appetite 0 - -  Feeling bad or failure about yourself  0 - -  Trouble concentrating 0 - -  Moving slowly or fidgety/restless 0 - -  Suicidal thoughts 0 - -  PHQ-9 Score 3 - -    Review of Systems  Constitutional: Positive for unexpected weight change.  HENT: Negative.   Eyes: Negative.   Respiratory: Negative.   Cardiovascular: Negative.   Gastrointestinal: Positive for constipation.  Endocrine: Negative.   Genitourinary: Negative.   Musculoskeletal: Positive for arthralgias, back pain and gait problem.       Spasms  Skin: Negative.   Allergic/Immunologic: Negative.   Neurological: Positive for weakness and numbness.       Tingling   Hematological: Negative.   Psychiatric/Behavioral: Negative.        Objective:   Physical Exam  Constitutional: He is oriented to person, place, and time. He appears well-developed and well-nourished.  HENT:  Head: Normocephalic and atraumatic.  Neck: Normal range of motion. Neck supple.  Cardiovascular: Normal rate and regular rhythm.  Pulmonary/Chest: Effort normal and breath sounds normal.  Musculoskeletal:  Normal Muscle Bulk and Muscle Testing Reveals: Upper Extremities: Full ROM and Muscle Strength 5/5 Lumbar Hypersensitivity Lower Extremities: Decreased ROM an Muscle Strength 4/5 Bilateral Lower Extremities Flexion Produces Pain into Bilateral Patella's Arrived from wheelchair  slowly using walker for support Antalgic Gait  Neurological: He is alert and oriented to person, place, and time.  Skin: Skin is warm and dry.  Psychiatric: He has a normal mood and affect.  Nursing note and vitals reviewed.         Assessment & Plan:  1. Lumbar Post-Laminectomy Syndrome/ Chronic Post-operative pain.: Continue Physical Therapy and HEP as Tolerated. Continue to Monitor 2. Lumbar Radiculitis: Continue Lyrica. Continue to monitor.  3. Bilateral Hip Pain: Continue HEP as Tolerated . Continue to Monitor.  4. Chronic Pain Syndrome: Refilled: Xtampza ER 27 Mg one tablet every 12 hours #60. RX: Oxycodone 5 mg one tablet daily for breakthrough pain. #30.  We will continue the opioid monitoring program, this consists of regular clinic visits, examinations, urine drug screen, pill counts as well as use of [place] Controlled Substance Reporting system.  F/U in 1 month   30 minutes of face to face patient care time was spent during this visit. All questions were encouraged and answered.

## 2017-11-16 ENCOUNTER — Ambulatory Visit: Payer: Medicare Other

## 2017-11-16 ENCOUNTER — Telehealth: Payer: Self-pay

## 2017-11-16 DIAGNOSIS — M545 Low back pain: Principal | ICD-10-CM

## 2017-11-16 DIAGNOSIS — M6281 Muscle weakness (generalized): Secondary | ICD-10-CM

## 2017-11-16 DIAGNOSIS — M62830 Muscle spasm of back: Secondary | ICD-10-CM

## 2017-11-16 DIAGNOSIS — R293 Abnormal posture: Secondary | ICD-10-CM

## 2017-11-16 DIAGNOSIS — R262 Difficulty in walking, not elsewhere classified: Secondary | ICD-10-CM

## 2017-11-16 DIAGNOSIS — G8929 Other chronic pain: Secondary | ICD-10-CM

## 2017-11-16 NOTE — Telephone Encounter (Signed)
Patient states that the addition of oxycodone 5mg  #30 is not helping.  Patient reports xtampza works well...Marland KitchenMarland Kitchenfor 8 hours.  Since the oxycodone 5mg  is not working, patient is hoping for some kind of adjustment.  Patients next appointment is 12/09/2017.  He will run out of Xtampza 11/21/2017.  It was filled on 10/22/17.  He will need a refill of this medication before Saturday.

## 2017-11-16 NOTE — Telephone Encounter (Signed)
Pt called stating something about his medication. His message was unclear so I called him back but had to leave message.

## 2017-11-16 NOTE — Therapy (Addendum)
[place], [place], [address] Phone: [phone]   Fax:  [phone]  Physical Therapy Treatment/Discharge  Patient Details  Name: Thomas Mcclure MRN: [id number] Date of Birth: 30-Sep-1955 Referring Provider: Glean Salvo, FNP   Encounter Date: 11/16/2017  PT End of Session - 11/16/17 1221    Visit Number  3    Number of Visits  24    Date for PT Re-Evaluation  01/29/18    Authorization Type  [employer] MCR    Authorization Time Period  KX at visit 15   progress note visit 10    PT Start Time  1220    PT Stop Time  1305    PT Time Calculation (min)  45 min    Activity Tolerance  Patient tolerated treatment well    Behavior During Therapy  [facility] for tasks assessed/performed       Past Medical History:  Diagnosis Date  . Baker's cyst    Left calf  . CAD in native artery, 07/15/15 PCI of RCA with DES 07/16/2015   a.   NSTEMI 5/17: LHC - pLAD 20, pLCx 20, OM1 30, pRCA 100, EF 45-50%>> PCI: 2.5 x 24 mm Promus DES to RCA  //  b.   Echo 5/17: mild LVH, EF 50-55%, no RWMA, mod RVE //  c. LHC 6/17: pLAD 20, pLCx 20, OM1 50, pRCA stent ok, EF 35-45% with mod sized inf wall and basal segment aneurysm  . Chronic back pain    "down my back, down my legs" (02/18/2017)  . Constipation   . GERD (gastroesophageal reflux disease)   . Hyperlipidemia   . Hypertension    Dr. [name] [phone]  . Ischemic cardiomyopathy    a. LV-gram at time of LHC in 6/17 with EF 35-45%  //  b. Echo 7/17: EF 45-50%, inferior HK, grade 1 diastolic dysfunction, mildly dilated aortic root, moderately reduced RVSF, mild RAE  . Myocardial infarction ([place]) 2017  . 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 Numbness and tingling of both lower extremities    "on the outside of both sides" (02/18/2017)  . Rheumatoid arthritis ([place])   . Type II diabetes mellitus ([place])    diet controlled    Past Surgical History:  Procedure  Laterality Date  . BACK SURGERY    . CARDIAC CATHETERIZATION N/A 07/15/2015   Procedure: Left Heart Cath and Coronary Angiography;  Surgeon: [name], MD;  Location: [facility];  Service: Cardiovascular;  Laterality: N/A;  . CARDIAC CATHETERIZATION N/A 07/15/2015   Procedure: Coronary Stent Intervention;  Surgeon: [name], MD;  Location: [facility];  Service: Cardiovascular;  Laterality: N/A;  . CARDIAC CATHETERIZATION N/A 08/07/2015   Procedure: Left Heart Cath and Coronary Angiography;  Surgeon: [name], MD;  Location: [facility];  Service: Cardiovascular;  Laterality: N/A;  . CORONARY ANGIOPLASTY WITH STENT PLACEMENT  07/2015  . JOINT REPLACEMENT    . LEFT HEART CATH AND CORONARY ANGIOGRAPHY N/A 02/19/2017   Procedure: LEFT HEART CATH AND CORONARY ANGIOGRAPHY;  Surgeon: Martinique, Peter M, MD;  Location: [facility];  Service: Cardiovascular;  Laterality: N/A;  . LUMBAR SPINE SURGERY  03/2009  & 2012  . MASS EXCISION N/A 04/19/2012   Procedure: removal of posterior cervical lipoma;  Surgeon: [name], MD;  Location: [facility];  Service: Neurosurgery;  Laterality: N/A;  Removal of posterior  cervical lipoma  . POPLITEAL SYNOVIAL CYST EXCISION Left    "opened up behind my knee; scraped out arthritis"  . SPINAL CORD STIMULATOR INSERTION N/A 01/07/2013   Procedure:  SPINAL CORD STIMULATOR INSERTION;  Surgeon: [name], MD;  Location: [facility];  Service: Neurosurgery;  Laterality: N/A;  . TONSILLECTOMY    . TOTAL HIP ARTHROPLASTY Right 10/03/2016   Procedure: TOTAL HIP ARTHROPLASTY;  Surgeon: [name], MD;  Location: [facility];  Service: Orthopedics;  Laterality: Right;  . WISDOM TOOTH EXTRACTION     hx of    There were no vitals filed for this visit.  Subjective Assessment - 11/16/17 1227    Subjective  Sore after last session. No pain now.     Currently in Pain?  No/denies                       [facility]  Adult PT Treatment/Exercise - 11/16/17 0001      Exercises   Exercises  Knee/Hip      Knee/Hip Exercises: Stretches   Other Knee/Hip Stretches  LTR RT/LT x 2 30 sec then knee to chest 30 sec RT/LT x 2      Knee/Hip Exercises: Aerobic   Nustep  L5 6 min LE only      Knee/Hip Exercises: Standing   Heel Raises  Both;15 reps    Hip Flexion  Right;Left;15 reps    Hip Abduction  Right;Left;15 reps    Functional Squat  15 reps;Limitations    Functional Squat Limitations  bilateal UE support       Knee/Hip Exercises: Seated   Long Arc Quad  Left;Right;20 reps    Long Arc Quad Limitations  , 3 pounds      Knee/Hip Exercises: Supine   Bridges  Both;10 reps    Other Supine Knee/Hip Exercises  bent knee raises x 10 RT/LT , x10 PPT,  hip clams x 10 , LTR x 10 RT/LT      Knee/Hip Exercises: Sidelying   Hip ABduction  Right;Left;10 reps    Clams  RT/ L:T x 12 each               PT Short Term Goals - 11/04/17 1248      PT SHORT TERM GOAL #1   Title  He will be independent with all HEP issued.      Time  4    Period  Weeks    Status  New      PT SHORT TERM GOAL #2   Title  His BERG score will improve by 10 points to demo functional improvement    Time  4    Period  Weeks    Status  New      PT SHORT TERM GOAL #3   Title  He wil improve BERG score  to 35 points of better to demo incr balance    Time  4    Period  Weeks    Status  New        PT Long Term Goals - 11/04/17 1250      PT LONG TERM GOAL #1   Title  He will be independent  with  all HEP issued    Time  12    Period  Weeks    Status  New      PT LONG TERM GOAL #2   Title  He will improve Berg score above 40  so he is  safe  walking with SPC    Time  12    Period  Weeks    Status  New      PT LONG TERM GOAL #3   Title  He will be able to walk 500 feet or more  to be  able to walk in community with no incr pain ( or 3 blocks per his perception) with SPC    Time  12    Period  Weeks    Status  New       PT LONG TERM GOAL #4   Title  He will improve Le strngth to 4?5 min all groups to improve stability and distance with walking    Time  12    Period  Weeks    Status  New      PT LONG TERM GOAL #5   Title  He will report greater ease walking up and down steps at home.      Time  12    Period  Weeks    Status  New      Additional Long Term Goals   Additional Long Term Goals  Yes      PT LONG TERM GOAL #6   Title  FOTO score will improve to 46% limited to demo perceived functional improvement    Time  12    Period  Weeks    Status  New            Plan - 11/16/17 1306    Clinical Impression Statement  He reports LE fatigue after session without incr pain.  Weakness in LE  I told him would take ashile to not be sore after exercises and incr strength.  Hold HEp to see if very sore after this session     PT Treatment/Interventions  Moist Heat;Therapeutic exercise;Balance training;Therapeutic activities;Patient/family education;Manual techniques;Passive range of motion;Gait training    PT Next Visit Plan  Exercises for strength.  Manual for ROM.  Modalities  PRN     Consulted and Agree with Plan of Care  Patient       Patient will benefit from skilled therapeutic intervention in order to improve the following deficits and impairments:  Pain, Postural dysfunction, Increased muscle spasms, Decreased activity tolerance, Decreased endurance, Decreased range of motion, Decreased strength, Decreased balance, Difficulty walking  Visit Diagnosis: Chronic bilateral low back pain, with sciatica presence unspecified  Muscle weakness (generalized)  Abnormal posture  Muscle spasm of back  Difficulty in walking, not elsewhere classified     Problem List Patient Active Problem List   Diagnosis Date Noted  . Radiculopathy of lumbar region 05/31/2017  . HCAP (healthcare-associated pneumonia) 05/30/2017  . Leukocytosis 05/30/2017  . Lumbar pseudoarthrosis 05/27/2017  . Unstable  angina ([place]) 02/18/2017  . Primary localized osteoarthritis of right hip 10/03/2016  . Chest pain, rule out acute myocardial infarction 05/01/2016  . Diabetes mellitus type 2, controlled, with complications ([place]) 69/62/9528  . Obesity (BMI 30-39.9) 05/01/2016  . Left ventricular aneurysm 12/03/2015  . Ischemic cardiomyopathy   . CAD (coronary artery disease) 07/16/2015  . Hypokalemia 07/16/2015  . History of non-ST elevation myocardial infarction (NSTEMI) 07/15/2015  . Hypertension 07/15/2015  . Hyperlipidemia with target LDL less than 100 07/15/2015  . Chronic back pain 07/15/2015  . GERD (gastroesophageal reflux disease) 06/18/2012  . Chronic radicular low back pain 06/18/2012    Darrel Hoover  PT 11/16/2017, 1:08 PM  [place]  [place], [place], [address] Phone: [phone]   Fax:  [phone]  Name: Thomas Mcclure MRN: [id number] Date of Birth: 11-01-1955  PHYSICAL THERAPY DISCHARGE SUMMARY  Visits from Start of Care: 3 Current functional level related to goals / functional outcomes: See above . He canceled all appointments and wanted discharge  due to pain   Remaining deficits: See above   Education / Equipment: HEP Plan: Patient agrees to discharge.  Patient goals were not met. Patient is being discharged due to the patient's request.  ?????   Pearson Forster PT   12/03/17

## 2017-11-17 NOTE — Telephone Encounter (Signed)
Return Thomas Mcclure call, he states he's not receiving any relief with the Oxycodone IR. He picked up the Oxycodone on 11/12/2017, he's only been on the Oxycodone IR for 5 days. He was instructed to continue current medication regimen and to call on Monday 11/23/2017, he verbalizes understanding.

## 2017-11-19 ENCOUNTER — Ambulatory Visit: Payer: Medicare Other

## 2017-11-23 ENCOUNTER — Telehealth: Payer: Self-pay | Admitting: *Deleted

## 2017-11-23 ENCOUNTER — Ambulatory Visit: Payer: Medicare Other

## 2017-11-23 NOTE — Telephone Encounter (Signed)
Patient called back as requested to report that the 5 mg Oxycodone is not providing any breakthrough relief between [facility] ER doses.  Asking for Zella Ball to please call back.

## 2017-11-23 NOTE — Telephone Encounter (Signed)
Return Thomas Mcclure call,  We will increase his Oxycodone IR to two tablets ( 10 mg) daily as needed for break through pain. The above was discussed with Dr. Letta Pate he agrees with plan. He was instructed to call this provider on Thursday 11/27/2017, he verbalizes understanding.

## 2017-11-25 ENCOUNTER — Telehealth: Payer: Self-pay | Admitting: Physical Medicine & Rehabilitation

## 2017-11-25 ENCOUNTER — Ambulatory Visit: Payer: Medicare Other | Admitting: Physical Therapy

## 2017-11-25 NOTE — Telephone Encounter (Signed)
Returned Thomas Mcclure called,  He's requesting increase in his Ginger Organ and to discontinued [name], this will be discussed with Dr. Letta Pate and will call him on 11/26/2017, he verbalizes understanding.

## 2017-11-25 NOTE — Telephone Encounter (Signed)
Patient would like for Zella Ball to call him about his Ginger Organ, he said the 12hr release isn't agreeing with body.  Please call him at [phone].

## 2017-11-26 ENCOUNTER — Telehealth: Payer: Self-pay | Admitting: Registered Nurse

## 2017-11-26 MED ORDER — OXYCODONE ER 9 MG PO C12A: 1.0000 | EXTENDED_RELEASE_CAPSULE | Freq: Two times a day (BID) | ORAL | 0 refills | Status: DC

## 2017-11-26 NOTE — Telephone Encounter (Signed)
Placed a call to Thomas Mcclure, no answer. Awaiting on a return call.   Also placed a call to Pharmacist at [facility] and spoke to Thomas Mcclure the pharmacist, to inquire  if I e-scribe prescription of Xtampza 9 mg capsule to equal 36 mg, to see if Insurance will cover. She's not sure. Thomas Mcclure picked up his Xtampza 27 mg on 11/21/2017.   Awaiting on Thomas Mcclure return call, with his count of Xtampza.

## 2017-11-26 NOTE — Telephone Encounter (Signed)
Patient and patients wife have returned calls and left voicemail thou there was no med count in the VM

## 2017-11-26 NOTE — Telephone Encounter (Signed)
Placed a call to Thomas Mcclure, he reports he has 53 capsules of Xtampza 27 mg, will prescribe the Xtampza 9 mg capsule to = 36 mg Capsule, if insurance will approve. He verbalizes understanding. According to the PMP Aware Website Xtampza 27 mg was filled on 11/21/2017. Dr. Letta Pate is awre of the above and agrees with plan.

## 2017-12-07 ENCOUNTER — Other Ambulatory Visit: Payer: Self-pay

## 2017-12-07 ENCOUNTER — Encounter: Payer: Self-pay | Admitting: Registered Nurse

## 2017-12-07 ENCOUNTER — Encounter: Payer: Medicare Other | Attending: Physical Medicine & Rehabilitation | Admitting: Registered Nurse

## 2017-12-07 VITALS — BP 126/86 | HR 95 | Ht 69.0 in | Wt 272.0 lb

## 2017-12-07 DIAGNOSIS — Z955 Presence of coronary angioplasty implant and graft: Secondary | ICD-10-CM | POA: Diagnosis not present

## 2017-12-07 DIAGNOSIS — Z96641 Presence of right artificial hip joint: Secondary | ICD-10-CM | POA: Diagnosis not present

## 2017-12-07 DIAGNOSIS — M961 Postlaminectomy syndrome, not elsewhere classified: Secondary | ICD-10-CM

## 2017-12-07 DIAGNOSIS — E785 Hyperlipidemia, unspecified: Secondary | ICD-10-CM | POA: Insufficient documentation

## 2017-12-07 DIAGNOSIS — Z8249 Family history of ischemic heart disease and other diseases of the circulatory system: Secondary | ICD-10-CM | POA: Insufficient documentation

## 2017-12-07 DIAGNOSIS — G894 Chronic pain syndrome: Secondary | ICD-10-CM | POA: Diagnosis not present

## 2017-12-07 DIAGNOSIS — Z8042 Family history of malignant neoplasm of prostate: Secondary | ICD-10-CM | POA: Diagnosis not present

## 2017-12-07 DIAGNOSIS — G8929 Other chronic pain: Secondary | ICD-10-CM | POA: Diagnosis present

## 2017-12-07 DIAGNOSIS — G8928 Other chronic postprocedural pain: Secondary | ICD-10-CM | POA: Insufficient documentation

## 2017-12-07 DIAGNOSIS — I2510 Atherosclerotic heart disease of native coronary artery without angina pectoris: Secondary | ICD-10-CM | POA: Insufficient documentation

## 2017-12-07 DIAGNOSIS — Z87891 Personal history of nicotine dependence: Secondary | ICD-10-CM | POA: Insufficient documentation

## 2017-12-07 DIAGNOSIS — M5416 Radiculopathy, lumbar region: Secondary | ICD-10-CM | POA: Diagnosis not present

## 2017-12-07 DIAGNOSIS — M069 Rheumatoid arthritis, unspecified: Secondary | ICD-10-CM | POA: Diagnosis not present

## 2017-12-07 DIAGNOSIS — E119 Type 2 diabetes mellitus without complications: Secondary | ICD-10-CM | POA: Insufficient documentation

## 2017-12-07 DIAGNOSIS — Z79891 Long term (current) use of opiate analgesic: Secondary | ICD-10-CM | POA: Diagnosis not present

## 2017-12-07 DIAGNOSIS — Z79899 Other long term (current) drug therapy: Secondary | ICD-10-CM | POA: Insufficient documentation

## 2017-12-07 DIAGNOSIS — I252 Old myocardial infarction: Secondary | ICD-10-CM | POA: Insufficient documentation

## 2017-12-07 DIAGNOSIS — K219 Gastro-esophageal reflux disease without esophagitis: Secondary | ICD-10-CM | POA: Insufficient documentation

## 2017-12-07 DIAGNOSIS — I10 Essential (primary) hypertension: Secondary | ICD-10-CM | POA: Diagnosis not present

## 2017-12-07 DIAGNOSIS — Z9689 Presence of other specified functional implants: Secondary | ICD-10-CM | POA: Insufficient documentation

## 2017-12-07 DIAGNOSIS — I255 Ischemic cardiomyopathy: Secondary | ICD-10-CM | POA: Insufficient documentation

## 2017-12-07 DIAGNOSIS — Z5181 Encounter for therapeutic drug level monitoring: Secondary | ICD-10-CM | POA: Diagnosis not present

## 2017-12-07 DIAGNOSIS — M545 Low back pain: Secondary | ICD-10-CM | POA: Diagnosis not present

## 2017-12-07 DIAGNOSIS — Z981 Arthrodesis status: Secondary | ICD-10-CM | POA: Insufficient documentation

## 2017-12-07 MED ORDER — OXYCODONE ER 36 MG PO C12A: 1.0000 | EXTENDED_RELEASE_CAPSULE | Freq: Two times a day (BID) | ORAL | 0 refills | Status: DC

## 2017-12-07 NOTE — Progress Notes (Signed)
Subjective:    Patient ID: Thomas Mcclure, male    DOB: [birth year], 62 y.o.   MRN: [id number]  HPI: Mr. Thomas Mcclure is a 62 year old male who is here for follow up appointment for chronic pain and medication refill. He states his pain is located in his lower back radiating into his bilateral lower extremities. He rates his pain 8. His current exercise regime is walking with his walker and performing stretching exercises.   Mr. [name] Morphine Equivalent is 108.00 MME. Last UDS was Performed on 10/15/2017, it was consistent.    Pain Inventory Average Pain 7 Pain Right Now 8 My pain is sharp, stabbing and tingling  In the last 24 hours, has pain interfered with the following? General activity 9 Relation with others 7 Enjoyment of life 8 What TIME of day is your pain at its worst? night Sleep (in general) Fair  Pain is worse with: walking, bending, sitting and standing Pain improves with: medication Relief from Meds: 8  Mobility use a cane use a walker how many minutes can you walk? 15 ability to climb steps?  yes do you drive?  yes  Function not employed: date last employed n/a  Neuro/Psych weakness numbness tingling trouble walking spasms  Prior Studies Any changes since last visit?  no  Physicians involved in your care Any changes since last visit?  no   Family History  Problem Relation Age of Onset  . Heart disease Mother   . Prostate cancer Brother    Social History   Socioeconomic History  . Marital status: Married    Spouse name: Not on file  . Number of children: 5  . Years of education: Not on file  . Highest education level: Not on file  Occupational History  . Occupation: Development worker, community, now on disability due to back pain  Social Needs  . Financial resource strain: Not on file  . Food insecurity:    Worry: Not on file    Inability: Not on file  . Transportation needs:    Medical: Not on file    Non-medical: Not on file    Tobacco Use  . Smoking status: Former Smoker    Packs/day: 0.25    Years: 40.00    Pack years: 10.00    Types: Cigarettes  . Smokeless tobacco: Never Used  . Tobacco comment: 02/18/2017 "quit in 2012"  Substance and Sexual Activity  . Alcohol use: No    Alcohol/week: 0.0 standard drinks  . Drug use: No  . Sexual activity: Not Currently  Lifestyle  . Physical activity:    Days per week: Not on file    Minutes per session: Not on file  . Stress: Not on file  Relationships  . Social connections:    Talks on phone: Not on file    Gets together: Not on file    Attends religious service: Not on file    Active member of club or organization: Not on file    Attends meetings of clubs or organizations: Not on file    Relationship status: Not on file  Other Topics Concern  . Not on file  Social History Narrative  . Not on file   Past Surgical History:  Procedure Laterality Date  . BACK SURGERY    . CARDIAC CATHETERIZATION N/A 07/15/2015   Procedure: Left Heart Cath and Coronary Angiography;  Surgeon: [name], MD;  Location: [facility];  Service: Cardiovascular;  Laterality:  N/A;  . CARDIAC CATHETERIZATION N/A 07/15/2015   Procedure: Coronary Stent Intervention;  Surgeon: [name], MD;  Location: [facility];  Service: Cardiovascular;  Laterality: N/A;  . CARDIAC CATHETERIZATION N/A 08/07/2015   Procedure: Left Heart Cath and Coronary Angiography;  Surgeon: [name], MD;  Location: [facility];  Service: Cardiovascular;  Laterality: N/A;  . CORONARY ANGIOPLASTY WITH STENT PLACEMENT  07/2015  . JOINT REPLACEMENT    . LEFT HEART CATH AND CORONARY ANGIOGRAPHY N/A 02/19/2017   Procedure: LEFT HEART CATH AND CORONARY ANGIOGRAPHY;  Surgeon: Martinique, Peter M, MD;  Location: [facility];  Service: Cardiovascular;  Laterality: N/A;  . LUMBAR SPINE SURGERY  03/2009  & 2012  . MASS EXCISION N/A 04/19/2012   Procedure: removal of posterior cervical  lipoma;  Surgeon: [name], MD;  Location: [facility];  Service: Neurosurgery;  Laterality: N/A;  Removal of posterior cervical lipoma  . POPLITEAL SYNOVIAL CYST EXCISION Left    "opened up behind my knee; scraped out arthritis"  . SPINAL CORD STIMULATOR INSERTION N/A 01/07/2013   Procedure:  SPINAL CORD STIMULATOR INSERTION;  Surgeon: [name], MD;  Location: [facility];  Service: Neurosurgery;  Laterality: N/A;  . TONSILLECTOMY    . TOTAL HIP ARTHROPLASTY Right 10/03/2016   Procedure: TOTAL HIP ARTHROPLASTY;  Surgeon: [name], MD;  Location: [facility];  Service: Orthopedics;  Laterality: Right;  . WISDOM TOOTH EXTRACTION     hx of   Past Medical History:  Diagnosis Date  . Baker's cyst    Left calf  . CAD in native artery, 07/15/15 PCI of RCA with DES 07/16/2015   a.   NSTEMI 5/17: LHC - pLAD 20, pLCx 20, OM1 30, pRCA 100, EF 45-50%>> PCI: 2.5 x 24 mm Promus DES to RCA  //  b.   Echo 5/17: mild LVH, EF 50-55%, no RWMA, mod RVE //  c. LHC 6/17: pLAD 20, pLCx 20, OM1 50, pRCA stent ok, EF 35-45% with mod sized inf wall and basal segment aneurysm  . Chronic back pain    "down my back, down my legs" (02/18/2017)  . Constipation   . GERD (gastroesophageal reflux disease)   . Hyperlipidemia   . Hypertension    Dr. [name] [phone]  . Ischemic cardiomyopathy    a. LV-gram at time of LHC in 6/17 with EF 35-45%  //  b. Echo 7/17: EF 45-50%, inferior HK, grade 1 diastolic dysfunction, mildly dilated aortic root, moderately reduced RVSF, mild RAE  . Myocardial infarction ([place]) 2017  . 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 Numbness and tingling of both lower extremities    "on the outside of both sides" (02/18/2017)  . Rheumatoid arthritis ([name])   . Type II diabetes mellitus ([facility])    diet controlled   BP 126/86   Pulse 95   Ht 5\' 9"  (1.753 m)   Wt 272 lb (123.4 kg)   SpO2 92%   BMI 40.17 kg/m   Opioid Risk Score:   Fall Risk Score:   `1  Depression screen PHQ 2/9  Depression screen [facility] 2/9 12/07/2017 10/15/2017 01/28/2016 11/12/2015  Decreased Interest 0 1 0 0  Down, Depressed, Hopeless 0 0 0 0  PHQ - 2 Score 0 1 0 0  Altered sleeping - 1 - -  Tired, decreased energy - 1 - -  Change in appetite - 0 - -  Feeling  bad or failure about yourself  - 0 - -  Trouble concentrating - 0 - -  Moving slowly or fidgety/restless - 0 - -  Suicidal thoughts - 0 - -  PHQ-9 Score - 3 - -     Review of Systems  Constitutional: Positive for diaphoresis.  HENT: Negative.   Eyes: Negative.   Respiratory: Negative.   Cardiovascular: Negative.   Gastrointestinal: Positive for constipation.  Endocrine: Negative.   Genitourinary: Negative.   Musculoskeletal: Negative.   Skin: Negative.   Allergic/Immunologic: Negative.   Neurological: Negative.   Hematological: Negative.   Psychiatric/Behavioral: Negative.   All other systems reviewed and are negative.      Objective:   Physical Exam  Constitutional: He is oriented to person, place, and time. He appears well-developed and well-nourished.  HENT:  Head: Normocephalic and atraumatic.  Neck: Normal range of motion. Neck supple.  Cardiovascular: Normal rate and regular rhythm.  Pulmonary/Chest: Effort normal and breath sounds normal.  Musculoskeletal:  Normal Muscle Bulk and Muscle Testing Reveals:  Upper Extremities: Full ROM and Muscle Strength 5/5 Thoracic Paraspinal Tenderness: T-7-T-9 Lumbar Hypersensitivity Lower Extremities: Decreased ROM and Muscle Strength 4/5 Bilateral Lower Extremities Flexion Produces Pain into Bilateral Lower Extremities Arises from Table Slowly using walker for support Antalgic Gait  Neurological: He is alert and oriented to person, place, and time.  Skin: Skin is warm and dry.  Psychiatric: He has a normal mood and affect. His behavior is normal.  Nursing note and vitals reviewed.         Assessment & Plan:  1. Lumbar  Post-Laminectomy Syndrome/ Chronic Post-operative pain.: Continue  HEP as Tolerated. Continue to Monitor. 12/07/2017. 2. Lumbar Radiculitis: Continue Lyrica. Continue to monitor. 12/07/2017. 3. Bilateral Hip Pain:No complaints today. Continue HEP as Tolerated . Continue to Monitor. 12/07/2017. 4. Chronic Pain Syndrome: Refilled: Xtampza ER 36 Mg one tablet every 12 hours #60.  We will continue the opioid monitoring program, this consists of regular clinic visits, examinations, urine drug screen, pill counts as well as use of [place] Controlled Substance Reporting system.  F/U in 1 month   30 minutes of face to face patient care time was spent during this visit. All questions were encouraged and answered.

## 2017-12-09 ENCOUNTER — Ambulatory Visit: Payer: Medicare Other | Admitting: Registered Nurse

## 2018-01-04 ENCOUNTER — Encounter: Payer: Self-pay | Admitting: Family

## 2018-01-04 NOTE — Progress Notes (Signed)
Outside notes received. Information abstracted. Notes sent to scan.  

## 2018-01-07 ENCOUNTER — Encounter: Payer: Self-pay | Admitting: Registered Nurse

## 2018-01-07 ENCOUNTER — Encounter: Payer: Medicare Other | Attending: Physical Medicine & Rehabilitation | Admitting: Registered Nurse

## 2018-01-07 VITALS — BP 128/86 | HR 92 | Ht 70.0 in | Wt 267.0 lb

## 2018-01-07 DIAGNOSIS — E119 Type 2 diabetes mellitus without complications: Secondary | ICD-10-CM | POA: Insufficient documentation

## 2018-01-07 DIAGNOSIS — Z5181 Encounter for therapeutic drug level monitoring: Secondary | ICD-10-CM | POA: Diagnosis not present

## 2018-01-07 DIAGNOSIS — I2510 Atherosclerotic heart disease of native coronary artery without angina pectoris: Secondary | ICD-10-CM | POA: Diagnosis not present

## 2018-01-07 DIAGNOSIS — M069 Rheumatoid arthritis, unspecified: Secondary | ICD-10-CM | POA: Insufficient documentation

## 2018-01-07 DIAGNOSIS — E785 Hyperlipidemia, unspecified: Secondary | ICD-10-CM | POA: Diagnosis not present

## 2018-01-07 DIAGNOSIS — G894 Chronic pain syndrome: Secondary | ICD-10-CM | POA: Diagnosis not present

## 2018-01-07 DIAGNOSIS — Z9689 Presence of other specified functional implants: Secondary | ICD-10-CM | POA: Diagnosis not present

## 2018-01-07 DIAGNOSIS — G8929 Other chronic pain: Secondary | ICD-10-CM | POA: Diagnosis present

## 2018-01-07 DIAGNOSIS — K219 Gastro-esophageal reflux disease without esophagitis: Secondary | ICD-10-CM | POA: Diagnosis not present

## 2018-01-07 DIAGNOSIS — M961 Postlaminectomy syndrome, not elsewhere classified: Secondary | ICD-10-CM | POA: Insufficient documentation

## 2018-01-07 DIAGNOSIS — I10 Essential (primary) hypertension: Secondary | ICD-10-CM | POA: Diagnosis not present

## 2018-01-07 DIAGNOSIS — Z87891 Personal history of nicotine dependence: Secondary | ICD-10-CM | POA: Diagnosis not present

## 2018-01-07 DIAGNOSIS — Z79891 Long term (current) use of opiate analgesic: Secondary | ICD-10-CM | POA: Insufficient documentation

## 2018-01-07 DIAGNOSIS — M5416 Radiculopathy, lumbar region: Secondary | ICD-10-CM

## 2018-01-07 DIAGNOSIS — Z79899 Other long term (current) drug therapy: Secondary | ICD-10-CM | POA: Diagnosis not present

## 2018-01-07 DIAGNOSIS — G8928 Other chronic postprocedural pain: Secondary | ICD-10-CM | POA: Insufficient documentation

## 2018-01-07 DIAGNOSIS — I255 Ischemic cardiomyopathy: Secondary | ICD-10-CM | POA: Insufficient documentation

## 2018-01-07 DIAGNOSIS — M545 Low back pain: Secondary | ICD-10-CM | POA: Diagnosis not present

## 2018-01-07 DIAGNOSIS — Z955 Presence of coronary angioplasty implant and graft: Secondary | ICD-10-CM | POA: Diagnosis not present

## 2018-01-07 DIAGNOSIS — Z981 Arthrodesis status: Secondary | ICD-10-CM | POA: Insufficient documentation

## 2018-01-07 DIAGNOSIS — Z96641 Presence of right artificial hip joint: Secondary | ICD-10-CM | POA: Insufficient documentation

## 2018-01-07 DIAGNOSIS — Z8249 Family history of ischemic heart disease and other diseases of the circulatory system: Secondary | ICD-10-CM | POA: Insufficient documentation

## 2018-01-07 DIAGNOSIS — Z8042 Family history of malignant neoplasm of prostate: Secondary | ICD-10-CM | POA: Insufficient documentation

## 2018-01-07 DIAGNOSIS — I252 Old myocardial infarction: Secondary | ICD-10-CM | POA: Diagnosis not present

## 2018-01-07 MED ORDER — OXYCODONE ER 36 MG PO C12A: 1.0000 | EXTENDED_RELEASE_CAPSULE | Freq: Two times a day (BID) | ORAL | 0 refills | Status: DC

## 2018-01-07 NOTE — Progress Notes (Signed)
Subjective:    Patient ID: Thomas Mcclure, male    DOB: 1955-09-27, 62 y.o.   MRN: [id number]  HPI: Thomas Mcclure is a 62 year old male who returns for follow up appointment for chronic pain and medication refill. He states his pain is located in his lower back radiating into his bilateral lower extremities. He rates his pain 9. His current exercise regime is walking.   Thomas Mcclure Morphine Equivalent is 108.00 MME. Last UDS was Performed on 10/15/2017, it was consistent.   Pain Inventory Average Pain 8 Pain Right Now 9 My pain is tingling and aching  In the last 24 hours, has pain interfered with the following? General activity 10 Relation with others 7 Enjoyment of life 8 What TIME of day is your pain at its worst? . Sleep (in general) Fair  Pain is worse with: walking, bending and standing Pain improves with: rest and medication Relief from Meds: 5  Mobility walk with assistance use a walker ability to climb steps?  yes do you drive?  yes  Function I need assistance with the following:  household duties  Neuro/Psych numbness  Prior Studies Any changes since last visit?  no  Physicians involved in your care Any changes since last visit?  no   Family History  Problem Relation Age of Onset  . Heart disease Mother   . Prostate cancer Brother    Social History   Socioeconomic History  . Marital status: Married    Spouse name: Not on file  . Number of children: 5  . Years of education: Not on file  . Highest education level: Not on file  Occupational History  . Occupation: Development worker, community, now on disability due to back pain  Social Needs  . Financial resource strain: Not on file  . Food insecurity:    Worry: Not on file    Inability: Not on file  . Transportation needs:    Medical: Not on file    Non-medical: Not on file  Tobacco Use  . Smoking status: Former Smoker    Packs/day: 0.25    Years: 40.00    Pack years: 10.00    Types:  Cigarettes  . Smokeless tobacco: Never Used  . Tobacco comment: 02/18/2017 "quit in 2012"  Substance and Sexual Activity  . Alcohol use: No    Alcohol/week: 0.0 standard drinks  . Drug use: No  . Sexual activity: Not Currently  Lifestyle  . Physical activity:    Days per week: Not on file    Minutes per session: Not on file  . Stress: Not on file  Relationships  . Social connections:    Talks on phone: Not on file    Gets together: Not on file    Attends religious service: Not on file    Active member of club or organization: Not on file    Attends meetings of clubs or organizations: Not on file    Relationship status: Not on file  Other Topics Concern  . Not on file  Social History Narrative  . Not on file   Past Surgical History:  Procedure Laterality Date  . BACK SURGERY    . CARDIAC CATHETERIZATION N/A 07/15/2015   Procedure: Left Heart Cath and Coronary Angiography;  Surgeon: [name], MD;  Location: [facility];  Service: Cardiovascular;  Laterality: N/A;  . CARDIAC CATHETERIZATION N/A 07/15/2015   Procedure: Coronary Stent Intervention;  Surgeon: [name], MD;  Location: [facility];  Service: Cardiovascular;  Laterality: N/A;  . CARDIAC CATHETERIZATION N/A 08/07/2015   Procedure: Left Heart Cath and Coronary Angiography;  Surgeon: [name], MD;  Location: [facility];  Service: Cardiovascular;  Laterality: N/A;  . CORONARY ANGIOPLASTY WITH STENT PLACEMENT  07/2015  . JOINT REPLACEMENT    . LEFT HEART CATH AND CORONARY ANGIOGRAPHY N/A 02/19/2017   Procedure: LEFT HEART CATH AND CORONARY ANGIOGRAPHY;  Surgeon: Martinique, Peter M, MD;  Location: [facility];  Service: Cardiovascular;  Laterality: N/A;  . LUMBAR SPINE SURGERY  03/2009  & 2012  . MASS EXCISION N/A 04/19/2012   Procedure: removal of posterior cervical lipoma;  Surgeon: [name], MD;  Location: [facility];  Service: Neurosurgery;  Laterality: N/A;  Removal of  posterior cervical lipoma  . POPLITEAL SYNOVIAL CYST EXCISION Left    "opened up behind my knee; scraped out arthritis"  . SPINAL CORD STIMULATOR INSERTION N/A 01/07/2013   Procedure:  SPINAL CORD STIMULATOR INSERTION;  Surgeon: [name], MD;  Location: [facility];  Service: Neurosurgery;  Laterality: N/A;  . TONSILLECTOMY    . TOTAL HIP ARTHROPLASTY Right 10/03/2016   Procedure: TOTAL HIP ARTHROPLASTY;  Surgeon: [name], MD;  Location: [facility];  Service: Orthopedics;  Laterality: Right;  . WISDOM TOOTH EXTRACTION     hx of   Past Medical History:  Diagnosis Date  . Baker's cyst    Left calf  . CAD in native artery, 07/15/15 PCI of RCA with DES 07/16/2015   a.   NSTEMI 5/17: LHC - pLAD 20, pLCx 20, OM1 30, pRCA 100, EF 45-50%>> PCI: 2.5 x 24 mm Promus DES to RCA  //  b.   Echo 5/17: mild LVH, EF 50-55%, no RWMA, mod RVE //  c. LHC 6/17: pLAD 20, pLCx 20, OM1 50, pRCA stent ok, EF 35-45% with mod sized inf wall and basal segment aneurysm  . Chronic back pain    "down my back, down my legs" (02/18/2017)  . Constipation   . GERD (gastroesophageal reflux disease)   . Hyperlipidemia   . Hypertension    Dr. [name] [phone]  . Ischemic cardiomyopathy    a. LV-gram at time of LHC in 6/17 with EF 35-45%  //  b. Echo 7/17: EF 45-50%, inferior HK, grade 1 diastolic dysfunction, mildly dilated aortic root, moderately reduced RVSF, mild RAE  . Myocardial infarction ([place]) 2017  . 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 Numbness and tingling of both lower extremities    "on the outside of both sides" (02/18/2017)  . Rheumatoid arthritis ([place])   . Type II diabetes mellitus (H[facility])    diet controlled   There were no vitals taken for this visit.  Opioid Risk Score:   Fall Risk Score:  `1  Depression screen PHQ 2/9  Depression screen [facility] 2/9 12/07/2017 10/15/2017 01/28/2016 11/12/2015  Decreased Interest 0 1 0 0  Down, Depressed, Hopeless 0 0 0 0  PHQ - 2 Score  0 1 0 0  Altered sleeping - 1 - -  Tired, decreased energy - 1 - -  Change in appetite - 0 - -  Feeling bad or failure about yourself  - 0 - -  Trouble concentrating - 0 - -  Moving slowly or fidgety/restless - 0 - -  Suicidal thoughts - 0 - -  PHQ-9 Score - 3 - -  Review of Systems  Constitutional: Negative.   HENT: Negative.   Eyes: Negative.   Respiratory: Negative.   Cardiovascular: Negative.   Gastrointestinal: Negative.   Endocrine: Negative.   Genitourinary: Negative.   Musculoskeletal: Positive for arthralgias, gait problem and myalgias.  Skin: Negative.   Allergic/Immunologic: Negative.   Neurological: Positive for numbness.  Hematological: Negative.   Psychiatric/Behavioral: Negative.   All other systems reviewed and are negative.      Objective:   Physical Exam  Constitutional: He is oriented to person, place, and time. He appears well-developed and well-nourished.  HENT:  Head: Normocephalic and atraumatic.  Neck: Normal range of motion. Neck supple.  Cardiovascular: Normal rate and regular rhythm.  Pulmonary/Chest: Effort normal and breath sounds normal.  Musculoskeletal:  Normal Muscle Bulk and Muscle testing Reveals: Upper Extremities: Full ROM and Muscle Strength 5/5 Lumbar Hypersensitivity Lower Extremities: Right: Full ROM and Muscle Strength 5/5 Left: Decreased ROM and Muscle Strength 4/5 Left Lower Extremity Flexion Produces Pain into Extremity Arises from Table Slowly using walker for support Narrow Based gait  Neurological: He is alert and oriented to person, place, and time.  Skin: Skin is warm and dry.  Psychiatric: He has a normal mood and affect. His behavior is normal.  Nursing note and vitals reviewed.         Assessment & Plan:  1. Lumbar Post-Laminectomy Syndrome/ Chronic Post-operative pain.: Continue  HEP as Tolerated. Continue to Monitor. 01/07/2018. 2. Lumbar Radiculitis: Continue Lyrica. Continue to monitor.  01/07/2018. 3. Bilateral Hip Pain:No complaints today. Continue HEP as Tolerated . Continue to Monitor. 01/07/2018. 4. Chronic Pain Syndrome: Refilled: Xtampza ER 36 Mg one tablet every 12 hours #60.  We will continue the opioid monitoring program, this consists of regular clinic visits, examinations, urine drug screen, pill counts as well as use of [place] Controlled Substance Reporting system.  F/U in 1 month   73minutes of face to face patient care time was spent during this visit. All questions were encouraged and answered.

## 2018-01-14 ENCOUNTER — Ambulatory Visit: Payer: Medicare Other | Admitting: Internal Medicine

## 2018-02-05 ENCOUNTER — Ambulatory Visit: Payer: Medicare Other | Admitting: Family

## 2018-02-09 ENCOUNTER — Ambulatory Visit: Payer: Medicare Other | Admitting: Physical Medicine & Rehabilitation

## 2018-02-11 ENCOUNTER — Encounter: Payer: Self-pay | Admitting: Physical Medicine & Rehabilitation

## 2018-02-11 ENCOUNTER — Encounter: Payer: Medicare Other | Attending: Physical Medicine & Rehabilitation

## 2018-02-11 ENCOUNTER — Other Ambulatory Visit: Payer: Self-pay

## 2018-02-11 ENCOUNTER — Ambulatory Visit (HOSPITAL_BASED_OUTPATIENT_CLINIC_OR_DEPARTMENT_OTHER): Payer: Medicare Other | Admitting: Physical Medicine & Rehabilitation

## 2018-02-11 VITALS — BP 154/82 | HR 100 | Ht 70.0 in | Wt 264.0 lb

## 2018-02-11 DIAGNOSIS — G8928 Other chronic postprocedural pain: Secondary | ICD-10-CM | POA: Insufficient documentation

## 2018-02-11 DIAGNOSIS — I2510 Atherosclerotic heart disease of native coronary artery without angina pectoris: Secondary | ICD-10-CM | POA: Diagnosis not present

## 2018-02-11 DIAGNOSIS — Z96641 Presence of right artificial hip joint: Secondary | ICD-10-CM | POA: Insufficient documentation

## 2018-02-11 DIAGNOSIS — M961 Postlaminectomy syndrome, not elsewhere classified: Secondary | ICD-10-CM | POA: Insufficient documentation

## 2018-02-11 DIAGNOSIS — G894 Chronic pain syndrome: Secondary | ICD-10-CM

## 2018-02-11 DIAGNOSIS — M5416 Radiculopathy, lumbar region: Secondary | ICD-10-CM

## 2018-02-11 DIAGNOSIS — Z981 Arthrodesis status: Secondary | ICD-10-CM | POA: Insufficient documentation

## 2018-02-11 DIAGNOSIS — Z79891 Long term (current) use of opiate analgesic: Secondary | ICD-10-CM | POA: Diagnosis not present

## 2018-02-11 DIAGNOSIS — M069 Rheumatoid arthritis, unspecified: Secondary | ICD-10-CM | POA: Insufficient documentation

## 2018-02-11 DIAGNOSIS — Z9689 Presence of other specified functional implants: Secondary | ICD-10-CM | POA: Diagnosis not present

## 2018-02-11 DIAGNOSIS — Z8249 Family history of ischemic heart disease and other diseases of the circulatory system: Secondary | ICD-10-CM | POA: Insufficient documentation

## 2018-02-11 DIAGNOSIS — Z87891 Personal history of nicotine dependence: Secondary | ICD-10-CM | POA: Diagnosis not present

## 2018-02-11 DIAGNOSIS — K219 Gastro-esophageal reflux disease without esophagitis: Secondary | ICD-10-CM | POA: Diagnosis not present

## 2018-02-11 DIAGNOSIS — M25551 Pain in right hip: Secondary | ICD-10-CM

## 2018-02-11 DIAGNOSIS — E119 Type 2 diabetes mellitus without complications: Secondary | ICD-10-CM | POA: Diagnosis not present

## 2018-02-11 DIAGNOSIS — M545 Low back pain: Secondary | ICD-10-CM | POA: Diagnosis not present

## 2018-02-11 DIAGNOSIS — I255 Ischemic cardiomyopathy: Secondary | ICD-10-CM | POA: Diagnosis not present

## 2018-02-11 DIAGNOSIS — Z8042 Family history of malignant neoplasm of prostate: Secondary | ICD-10-CM | POA: Diagnosis not present

## 2018-02-11 DIAGNOSIS — I252 Old myocardial infarction: Secondary | ICD-10-CM | POA: Diagnosis not present

## 2018-02-11 DIAGNOSIS — Z955 Presence of coronary angioplasty implant and graft: Secondary | ICD-10-CM | POA: Insufficient documentation

## 2018-02-11 DIAGNOSIS — Z79899 Other long term (current) drug therapy: Secondary | ICD-10-CM | POA: Insufficient documentation

## 2018-02-11 DIAGNOSIS — E785 Hyperlipidemia, unspecified: Secondary | ICD-10-CM | POA: Diagnosis not present

## 2018-02-11 DIAGNOSIS — G8929 Other chronic pain: Secondary | ICD-10-CM | POA: Diagnosis present

## 2018-02-11 DIAGNOSIS — I10 Essential (primary) hypertension: Secondary | ICD-10-CM | POA: Insufficient documentation

## 2018-02-11 MED ORDER — OXYCODONE ER 36 MG PO C12A: 1.0000 | EXTENDED_RELEASE_CAPSULE | Freq: Two times a day (BID) | ORAL | 0 refills | Status: DC

## 2018-02-11 NOTE — Progress Notes (Signed)
Subjective:    Patient ID: Thomas Mcclure, male    DOB: [birth year], 62 y.o.   MRN: [id number]  HPI 62 year old male with history of hypertension, diabetes, coronary artery disease, who is referred for chronic low back pain.  His history stretches back several years, he had surgery with Dr. [name] in 2011 L3-S1 fusion.  Spinal cord stimulator placed in 2014 with leads in the thoracic area.  Patient's fusion was extended to L2-3 as of April 2019. Patient has been on chronic narcotic analgesics for many years prescribed by his primary care physician Dr. Kevan Ny who has recently retired Taking about 7 tabs of oxy IR 10mg  per day although his prescription from July 2019 was for 6/day He was switched to [facility] extended release 36 mg twice daily, estimated morphine milligram equivalents 108 Patient states that he is walking better and is now walking outside the home on a regular basis with his walker.  He continues to have bilateral lower extremity discomfort.  He also gives a history of right total knee replacement as well as right hip replacement in the past. Pain Inventory Average Pain 8 Pain Right Now 8 My pain is sharp, burning, tingling and aching  In the last 24 hours, has pain interfered with the following? General activity 4 Relation with others 7 Enjoyment of life 7 What TIME of day is your pain at its worst? daytime night Sleep (in general) Fair  Pain is worse with: walking and bending Pain improves with: rest and medication Relief from Meds: 7  Mobility walk with assistance use a cane use a walker how many minutes can you walk? 10 ability to climb steps?  no do you drive?  yes  Function disabled: date disabled n/a  Neuro/Psych weakness tingling trouble walking  Prior Studies Any changes since last visit?  no  Physicians involved in your care Any changes since last visit?  no Primary care Dr. Lavonia Drafts   Family History  Problem Relation Age of Onset  .  Heart disease Mother   . Prostate cancer Brother    Social History   Socioeconomic History  . Marital status: Married    Spouse name: Not on file  . Number of children: 5  . Years of education: Not on file  . Highest education level: Not on file  Occupational History  . Occupation: Development worker, community, now on disability due to back pain  Social Needs  . Financial resource strain: Not on file  . Food insecurity:    Worry: Not on file    Inability: Not on file  . Transportation needs:    Medical: Not on file    Non-medical: Not on file  Tobacco Use  . Smoking status: Former Smoker    Packs/day: 0.25    Years: 40.00    Pack years: 10.00    Types: Cigarettes  . Smokeless tobacco: Never Used  . Tobacco comment: 02/18/2017 "quit in 2012"  Substance and Sexual Activity  . Alcohol use: No    Alcohol/week: 0.0 standard drinks  . Drug use: No  . Sexual activity: Not Currently  Lifestyle  . Physical activity:    Days per week: Not on file    Minutes per session: Not on file  . Stress: Not on file  Relationships  . Social connections:    Talks on phone: Not on file    Gets together: Not on file    Attends religious service: Not on file    Active  member of club or organization: Not on file    Attends meetings of clubs or organizations: Not on file    Relationship status: Not on file  Other Topics Concern  . Not on file  Social History Narrative  . Not on file   Past Surgical History:  Procedure Laterality Date  . BACK SURGERY    . CARDIAC CATHETERIZATION N/A 07/15/2015   Procedure: Left Heart Cath and Coronary Angiography;  Surgeon: [name], MD;  Location: [place] [facility];  Service: Cardiovascular;  Laterality: N/A;  . CARDIAC CATHETERIZATION N/A 07/15/2015   Procedure: Coronary Stent Intervention;  Surgeon: [name], MD;  Location: [place] [facility];  Service: Cardiovascular;  Laterality: N/A;  . CARDIAC CATHETERIZATION N/A 08/07/2015   Procedure:  Left Heart Cath and Coronary Angiography;  Surgeon: [name], MD;  Location: [place] [facility];  Service: Cardiovascular;  Laterality: N/A;  . CORONARY ANGIOPLASTY WITH STENT PLACEMENT  07/2015  . JOINT REPLACEMENT    . LEFT HEART CATH AND CORONARY ANGIOGRAPHY N/A 02/19/2017   Procedure: LEFT HEART CATH AND CORONARY ANGIOGRAPHY;  Surgeon: Martinique, Peter M, MD;  Location: [place] [facility];  Service: Cardiovascular;  Laterality: N/A;  . LUMBAR SPINE SURGERY  03/2009  & 2012  . MASS EXCISION N/A 04/19/2012   Procedure: removal of posterior cervical lipoma;  Surgeon: [name], MD;  Location: [place] [facility];  Service: Neurosurgery;  Laterality: N/A;  Removal of posterior cervical lipoma  . POPLITEAL SYNOVIAL CYST EXCISION Left    "opened up behind my knee; scraped out arthritis"  . SPINAL CORD STIMULATOR INSERTION N/A 01/07/2013   Procedure:  SPINAL CORD STIMULATOR INSERTION;  Surgeon: [name], MD;  Location: [facility];  Service: Neurosurgery;  Laterality: N/A;  . TONSILLECTOMY    . TOTAL HIP ARTHROPLASTY Right 10/03/2016   Procedure: TOTAL HIP ARTHROPLASTY;  Surgeon: [name], MD;  Location: [place];  Service: Orthopedics;  Laterality: Right;  . WISDOM TOOTH EXTRACTION     hx of   Past Medical History:  Diagnosis Date  . Baker's cyst    Left calf  . CAD in native artery, 07/15/15 PCI of RCA with DES 07/16/2015   a.   NSTEMI 5/17: LHC - pLAD 20, pLCx 20, OM1 30, pRCA 100, EF 45-50%>> PCI: 2.5 x 24 mm Promus DES to RCA  //  b.   Echo 5/17: mild LVH, EF 50-55%, no RWMA, mod RVE //  c. LHC 6/17: pLAD 20, pLCx 20, OM1 50, pRCA stent ok, EF 35-45% with mod sized inf wall and basal segment aneurysm  . Chronic back pain    "down my back, down my legs" (02/18/2017)  . Constipation   . GERD (gastroesophageal reflux disease)   . Hyperlipidemia   . Hypertension    Dr. [name] [phone]  . Ischemic cardiomyopathy    a. LV-gram at time of LHC in 6/17 with EF 35-45%  //  b.  Echo 7/17: EF 45-50%, inferior HK, grade 1 diastolic dysfunction, mildly dilated aortic root, moderately reduced RVSF, mild RAE  . Myocardial infarction ([place]) 2017  . 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 Numbness and tingling of both lower extremities    "on the outside of both sides" (02/18/2017)  . Rheumatoid arthritis ([place])   . Type II diabetes mellitus (HCC)    diet controlled   BP (!) 154/82   Pulse 100   Ht 5'  10" (1.778 m)   Wt 264 lb (119.7 kg)   SpO2 93%   BMI 37.88 kg/m   Opioid Risk Score:   Fall Risk Score:  `1  Depression screen PHQ 2/9  Depression screen [facility] 2/9 02/11/2018 12/07/2017 10/15/2017 01/28/2016 11/12/2015  Decreased Interest 0 0 1 0 0  Down, Depressed, Hopeless 0 0 0 0 0  PHQ - 2 Score 0 0 1 0 0  Altered sleeping - - 1 - -  Tired, decreased energy - - 1 - -  Change in appetite - - 0 - -  Feeling bad or failure about yourself  - - 0 - -  Trouble concentrating - - 0 - -  Moving slowly or fidgety/restless - - 0 - -  Suicidal thoughts - - 0 - -  PHQ-9 Score - - 3 - -    Review of Systems  Constitutional: Positive for diaphoresis.  HENT: Negative.   Eyes: Negative.   Respiratory: Negative.   Cardiovascular: Negative.   Gastrointestinal: Negative.   Endocrine: Negative.   Genitourinary: Negative.   Musculoskeletal: Negative.   Skin: Negative.   Allergic/Immunologic: Negative.   Neurological: Negative.   Hematological: Negative.   Psychiatric/Behavioral: Negative.   All other systems reviewed and are negative.      Objective:   Physical Exam Vitals signs and nursing note reviewed.  Constitutional:      Appearance: Normal appearance.  HENT:     Head: Normocephalic and atraumatic.  Eyes:     Extraocular Movements: Extraocular movements intact.     Pupils: Pupils are equal, round, and reactive to light.  Musculoskeletal:     Comments: He has mild tenderness palpation in the cervical paraspinal muscles as well as lumbar  paraspinals.  He has mild tenderness palpation along the medial aspect of the knee.  No evidence of knee effusion.  Skin:    General: Skin is warm and dry.  Neurological:     Mental Status: He is alert and oriented to person, place, and time.     Comments: Strength is 4/5 bilateral hip flexor knee extensor ankle dorsiflexor. Upper extremity strength 5/5 in the deltoid by stress grip   Psychiatric:        Mood and Affect: Mood normal.           Assessment & Plan:  1.  Chronic multifactorial pain.  He has lumbar postlaminectomy syndrome with extensive history of fusion.  In addition he has chronic postoperative pain status post right total knee and right total hip replacement.  He has chronic radicular pain. He has been improving with his level of physical activity.  He has goals of ambulating 8-9 blocks.  He also has goals of eventually ambulating without a walker  Indication for chronic opioid: Lumbar postlaminectomy syndrome Medication and dose: Xtampza 36mg  BID # pills per month: 60 Last UDS date: 10/15/17 demonstrated Oxycodone and diazepam Opioid Treatment Agreement signed (Y/N): y Opioid Treatment Agreement last reviewed with patient:   [facility] reviewed this encounter (include red flags):     We discussed his exercise program at some length.  Answered questions from patient and wife Over half of the 25 min visit was spent counseling and coordinating care.

## 2018-02-12 ENCOUNTER — Ambulatory Visit: Payer: Medicare Other | Admitting: Physical Medicine & Rehabilitation

## 2018-02-16 ENCOUNTER — Other Ambulatory Visit: Payer: Self-pay | Admitting: Family

## 2018-03-02 ENCOUNTER — Other Ambulatory Visit: Payer: Self-pay | Admitting: Family

## 2018-03-09 ENCOUNTER — Encounter: Payer: Self-pay | Admitting: Registered Nurse

## 2018-03-09 ENCOUNTER — Encounter: Payer: Medicare Other | Attending: Physical Medicine & Rehabilitation | Admitting: Registered Nurse

## 2018-03-09 VITALS — BP 146/94 | HR 95 | Ht 70.0 in | Wt 261.0 lb

## 2018-03-09 DIAGNOSIS — K219 Gastro-esophageal reflux disease without esophagitis: Secondary | ICD-10-CM | POA: Insufficient documentation

## 2018-03-09 DIAGNOSIS — Z955 Presence of coronary angioplasty implant and graft: Secondary | ICD-10-CM | POA: Diagnosis not present

## 2018-03-09 DIAGNOSIS — Z5181 Encounter for therapeutic drug level monitoring: Secondary | ICD-10-CM

## 2018-03-09 DIAGNOSIS — E119 Type 2 diabetes mellitus without complications: Secondary | ICD-10-CM | POA: Diagnosis not present

## 2018-03-09 DIAGNOSIS — Z87891 Personal history of nicotine dependence: Secondary | ICD-10-CM | POA: Insufficient documentation

## 2018-03-09 DIAGNOSIS — Z8249 Family history of ischemic heart disease and other diseases of the circulatory system: Secondary | ICD-10-CM | POA: Diagnosis not present

## 2018-03-09 DIAGNOSIS — G8928 Other chronic postprocedural pain: Secondary | ICD-10-CM | POA: Insufficient documentation

## 2018-03-09 DIAGNOSIS — M961 Postlaminectomy syndrome, not elsewhere classified: Secondary | ICD-10-CM | POA: Insufficient documentation

## 2018-03-09 DIAGNOSIS — Z981 Arthrodesis status: Secondary | ICD-10-CM | POA: Diagnosis not present

## 2018-03-09 DIAGNOSIS — Z9689 Presence of other specified functional implants: Secondary | ICD-10-CM | POA: Insufficient documentation

## 2018-03-09 DIAGNOSIS — M069 Rheumatoid arthritis, unspecified: Secondary | ICD-10-CM | POA: Diagnosis not present

## 2018-03-09 DIAGNOSIS — M545 Low back pain: Secondary | ICD-10-CM | POA: Diagnosis not present

## 2018-03-09 DIAGNOSIS — I255 Ischemic cardiomyopathy: Secondary | ICD-10-CM | POA: Diagnosis not present

## 2018-03-09 DIAGNOSIS — Z8042 Family history of malignant neoplasm of prostate: Secondary | ICD-10-CM | POA: Insufficient documentation

## 2018-03-09 DIAGNOSIS — Z79899 Other long term (current) drug therapy: Secondary | ICD-10-CM | POA: Insufficient documentation

## 2018-03-09 DIAGNOSIS — Z96641 Presence of right artificial hip joint: Secondary | ICD-10-CM | POA: Insufficient documentation

## 2018-03-09 DIAGNOSIS — M5416 Radiculopathy, lumbar region: Secondary | ICD-10-CM

## 2018-03-09 DIAGNOSIS — I252 Old myocardial infarction: Secondary | ICD-10-CM | POA: Insufficient documentation

## 2018-03-09 DIAGNOSIS — E785 Hyperlipidemia, unspecified: Secondary | ICD-10-CM | POA: Diagnosis not present

## 2018-03-09 DIAGNOSIS — I2510 Atherosclerotic heart disease of native coronary artery without angina pectoris: Secondary | ICD-10-CM | POA: Insufficient documentation

## 2018-03-09 DIAGNOSIS — I10 Essential (primary) hypertension: Secondary | ICD-10-CM | POA: Diagnosis not present

## 2018-03-09 DIAGNOSIS — Z79891 Long term (current) use of opiate analgesic: Secondary | ICD-10-CM | POA: Insufficient documentation

## 2018-03-09 DIAGNOSIS — G894 Chronic pain syndrome: Secondary | ICD-10-CM

## 2018-03-09 DIAGNOSIS — G8929 Other chronic pain: Secondary | ICD-10-CM | POA: Diagnosis present

## 2018-03-09 MED ORDER — OXYCODONE ER 36 MG PO C12A: 1.0000 | EXTENDED_RELEASE_CAPSULE | Freq: Two times a day (BID) | ORAL | 0 refills | Status: DC

## 2018-03-09 NOTE — Progress Notes (Signed)
Subjective:    Patient ID: [name], male    DOB: 08-Jun-1955, 63 y.o.   MRN: [id number]  HPI: Thomas Mcclure is a 63 y.o. male who returns for follow up appointment for chronic pain and medication refill. He states his pain is located in his lower back radiating into his lower extremities. He rates his pain 5. His current exercise regime is walking and performing stretching exercises. Also reports he has increase his activity he's walking   Thomas Mcclure Morphine equivalent is 108.00 MME.  Last UDS was Performed on 10/15/2017, it was consistent. UDS ordered today.   Pain Inventory Average Pain 8 Pain Right Now 5 My pain is sharp, burning, stabbing, tingling and aching  In the last 24 hours, has pain interfered with the following? General activity 7 Relation with others 7 Enjoyment of life 7 What TIME of day is your pain at its worst? na Sleep (in general) Fair  Pain is worse with: bending, sitting and some activites Pain improves with: rest, medication and injections Relief from Meds: 8  Mobility walk with assistance use a cane use a walker ability to climb steps?  yes do you drive?  yes  Function employed # of hrs/week .  Neuro/Psych numbness tingling spasms  Prior Studies Any changes since last visit?  no  Physicians involved in your care Any changes since last visit?  no   Family History  Problem Relation Age of Onset  . Heart disease Mother   . Prostate cancer Brother    Social History   Socioeconomic History  . Marital status: Married    Spouse name: Not on file  . Number of children: 5  . Years of education: Not on file  . Highest education level: Not on file  Occupational History  . Occupation: Development worker, community, now on disability due to back pain  Social Needs  . Financial resource strain: Not on file  . Food insecurity:    Worry: Not on file    Inability: Not on file  . Transportation needs:    Medical: Not on file   Non-medical: Not on file  Tobacco Use  . Smoking status: Former Smoker    Packs/day: 0.25    Years: 40.00    Pack years: 10.00    Types: Cigarettes  . Smokeless tobacco: Never Used  . Tobacco comment: 02/18/2017 "quit in 2012"  Substance and Sexual Activity  . Alcohol use: No    Alcohol/week: 0.0 standard drinks  . Drug use: No  . Sexual activity: Not Currently  Lifestyle  . Physical activity:    Days per week: Not on file    Minutes per session: Not on file  . Stress: Not on file  Relationships  . Social connections:    Talks on phone: Not on file    Gets together: Not on file    Attends religious service: Not on file    Active member of club or organization: Not on file    Attends meetings of clubs or organizations: Not on file    Relationship status: Not on file  Other Topics Concern  . Not on file  Social History Narrative  . Not on file   Past Surgical History:  Procedure Laterality Date  . BACK SURGERY    . CARDIAC CATHETERIZATION N/A 07/15/2015   Procedure: Left Heart Cath and Coronary Angiography;  Surgeon: [name], MD;  Location: [place] [facility];  Service: Cardiovascular;  Laterality: N/A;  .  CARDIAC CATHETERIZATION N/A 07/15/2015   Procedure: Coronary Stent Intervention;  Surgeon: [name], MD;  Location: [place] [facility];  Service: Cardiovascular;  Laterality: N/A;  . CARDIAC CATHETERIZATION N/A 08/07/2015   Procedure: Left Heart Cath and Coronary Angiography;  Surgeon: [name], MD;  Location: [place] [facility];  Service: Cardiovascular;  Laterality: N/A;  . CORONARY ANGIOPLASTY WITH STENT PLACEMENT  07/2015  . JOINT REPLACEMENT    . LEFT HEART CATH AND CORONARY ANGIOGRAPHY N/A 02/19/2017   Procedure: LEFT HEART CATH AND CORONARY ANGIOGRAPHY;  Surgeon: Martinique, Peter M, MD;  Location: [place] [facility];  Service: Cardiovascular;  Laterality: N/A;  . LUMBAR SPINE SURGERY  03/2009  & 2012  . MASS EXCISION N/A 04/19/2012   Procedure: removal  of posterior cervical lipoma;  Surgeon: [name], MD;  Location: [place] [facility];  Service: Neurosurgery;  Laterality: N/A;  Removal of posterior cervical lipoma  . POPLITEAL SYNOVIAL CYST EXCISION Left    "opened up behind my knee; scraped out arthritis"  . SPINAL CORD STIMULATOR INSERTION N/A 01/07/2013   Procedure:  SPINAL CORD STIMULATOR INSERTION;  Surgeon: [name], MD;  Location: [facility];  Service: Neurosurgery;  Laterality: N/A;  . TONSILLECTOMY    . TOTAL HIP ARTHROPLASTY Right 10/03/2016   Procedure: TOTAL HIP ARTHROPLASTY;  Surgeon: [name], MD;  Location: [place];  Service: Orthopedics;  Laterality: Right;  . WISDOM TOOTH EXTRACTION     hx of   Past Medical History:  Diagnosis Date  . Baker's cyst    Left calf  . CAD in native artery, 07/15/15 PCI of RCA with DES 07/16/2015   a.   NSTEMI 5/17: LHC - pLAD 20, pLCx 20, OM1 30, pRCA 100, EF 45-50%>> PCI: 2.5 x 24 mm Promus DES to RCA  //  b.   Echo 5/17: mild LVH, EF 50-55%, no RWMA, mod RVE //  c. LHC 6/17: pLAD 20, pLCx 20, OM1 50, pRCA stent ok, EF 35-45% with mod sized inf wall and basal segment aneurysm  . Chronic back pain    "down my back, down my legs" (02/18/2017)  . Constipation   . GERD (gastroesophageal reflux disease)   . Hyperlipidemia   . Hypertension    Dr. [name] [phone]  . Ischemic cardiomyopathy    a. LV-gram at time of LHC in 6/17 with EF 35-45%  //  b. Echo 7/17: EF 45-50%, inferior HK, grade 1 diastolic dysfunction, mildly dilated aortic root, moderately reduced RVSF, mild RAE  . Myocardial infarction ([place]) 2017  . 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 Numbness and tingling of both lower extremities    "on the outside of both sides" (02/18/2017)  . Rheumatoid arthritis ([place])   . Type II diabetes mellitus (HCC)    diet controlled   BP (!) 146/94   Pulse 95   Ht 5\' 4"  (1.626 m)   Wt 261 lb (118.4 kg)   SpO2 95%   BMI 44.80 kg/m   Opioid Risk Score:    Fall Risk Score:  `1  Depression screen PHQ 2/9  Depression screen [facility] 2/9 02/11/2018 12/07/2017 10/15/2017 01/28/2016 11/12/2015  Decreased Interest 0 0 1 0 0  Down, Depressed, Hopeless 0 0 0 0 0  PHQ - 2 Score 0 0 1 0 0  Altered sleeping - - 1 - -  Tired, decreased energy - - 1 - -  Change in appetite - -  0 - -  Feeling bad or failure about yourself  - - 0 - -  Trouble concentrating - - 0 - -  Moving slowly or fidgety/restless - - 0 - -  Suicidal thoughts - - 0 - -  PHQ-9 Score - - 3 - -     Review of Systems  Constitutional: Positive for diaphoresis and unexpected weight change.  HENT: Negative.   Eyes: Negative.   Respiratory: Negative.   Cardiovascular: Negative.   Gastrointestinal: Negative.   Endocrine: Negative.   Genitourinary: Negative.   Musculoskeletal: Positive for arthralgias, gait problem and myalgias.  Skin: Negative.   Allergic/Immunologic: Negative.   Neurological: Positive for numbness.  Hematological: Negative.   Psychiatric/Behavioral: Negative.   All other systems reviewed and are negative.      Objective:   Physical Exam Vitals signs and nursing note reviewed.  Constitutional:      Appearance: Normal appearance.  Neck:     Musculoskeletal: Normal range of motion and neck supple.  Cardiovascular:     Rate and Rhythm: Normal rate and regular rhythm.     Pulses: Normal pulses.     Heart sounds: Normal heart sounds.  Pulmonary:     Effort: Pulmonary effort is normal.     Breath sounds: Normal breath sounds.  Musculoskeletal:     Comments: Normal Muscle Bulk and Muscle Testing Reveals:  Upper Extremities: Full ROM and Muscle Strength 5/5  Lumbar Paraspinal Tenderness: L-2-L-4 Lower Extremities: Full ROM and Muscle Strength 5/5 Arises from Table Slowly using cane for support Narrow Based Gait   Skin:    General: Skin is warm and dry.  Neurological:     Mental Status: He is alert and oriented to person, place, and time.  Psychiatric:         Mood and Affect: Mood normal.        Behavior: Behavior normal.           Assessment & Plan:  1. Lumbar Post-Laminectomy Syndrome/ Chronic Post-operative pain.: Continue HEP as Tolerated. Continue to Monitor. 03/09/2018. 2. Lumbar Radiculitis: Continue Lyrica. Continue to monitor.03/09/2018 3. Bilateral Hip Pain:No complaints today.Continue HEP as Tolerated . Continue to Monitor. 03/09/2018 4. Chronic Pain Syndrome: Refilled: Xtampza ER36Mg  one tablet every 12 hours #60.  We will continue the opioid monitoring program, this consists of regular clinic visits, examinations, urine drug screen, pill counts as well as use of [place] Controlled Substance Reporting system.  F/U in 1 month   48minutes of face to face patient care time was spent during this visit. All questions were encouraged and answered.

## 2018-03-15 ENCOUNTER — Telehealth: Payer: Self-pay | Admitting: *Deleted

## 2018-03-15 LAB — TOXASSURE SELECT,+ANTIDEPR,UR

## 2018-03-15 NOTE — Telephone Encounter (Signed)
Urine drug screen for this encounter is consistent for prescribed medication 

## 2018-03-17 ENCOUNTER — Telehealth: Payer: Self-pay | Admitting: *Deleted

## 2018-03-17 NOTE — Telephone Encounter (Signed)
Mr Delange has been requesting an increase on his lyrica and family medicine called and is wanting Dr Letta Pate to assume the prescribing. Please advise.

## 2018-03-18 MED ORDER — PREGABALIN 100 MG PO CAPS: 200.0000 mg | ORAL_CAPSULE | Freq: Two times a day (BID) | ORAL | 1 refills | Status: DC

## 2018-03-18 NOTE — Telephone Encounter (Signed)
Done increased to 200mg  BID

## 2018-03-23 NOTE — Telephone Encounter (Signed)
Notified by VM per DPR. °

## 2018-03-24 ENCOUNTER — Telehealth: Payer: Self-pay | Admitting: Family

## 2018-03-24 ENCOUNTER — Other Ambulatory Visit: Payer: Self-pay | Admitting: Family

## 2018-03-24 NOTE — Telephone Encounter (Signed)
Is he still a patient here or has he established with new PCP?

## 2018-03-24 NOTE — Telephone Encounter (Signed)
lw 

## 2018-03-24 NOTE — Progress Notes (Signed)
Spoke with patient's wife; she is listed on DPR; patient is no longer under our care/ has new PCP; no further refills from this office.

## 2018-04-05 ENCOUNTER — Telehealth: Payer: Self-pay | Admitting: *Deleted

## 2018-04-05 MED ORDER — PREGABALIN 200 MG PO CAPS: 200.0000 mg | ORAL_CAPSULE | Freq: Two times a day (BID) | ORAL | 0 refills | Status: DC

## 2018-04-05 NOTE — Telephone Encounter (Signed)
PA submitted for Lyrica.

## 2018-04-05 NOTE — Telephone Encounter (Signed)
Thomas Mcclure called ans says his pregabalin needs a PA.  He will be out of medications on Wednesday.

## 2018-04-05 NOTE — Addendum Note (Signed)
Addended by: Geryl Rankins D on: 04/05/2018 04:54 PM   Modules accepted: Orders

## 2018-04-13 ENCOUNTER — Encounter: Payer: Medicare Other | Attending: Physical Medicine & Rehabilitation | Admitting: Registered Nurse

## 2018-04-13 ENCOUNTER — Encounter: Payer: Self-pay | Admitting: Registered Nurse

## 2018-04-13 VITALS — BP 111/75 | HR 98 | Resp 14 | Ht 70.0 in | Wt 260.0 lb

## 2018-04-13 DIAGNOSIS — M25561 Pain in right knee: Secondary | ICD-10-CM

## 2018-04-13 DIAGNOSIS — I10 Essential (primary) hypertension: Secondary | ICD-10-CM | POA: Insufficient documentation

## 2018-04-13 DIAGNOSIS — Z9689 Presence of other specified functional implants: Secondary | ICD-10-CM | POA: Insufficient documentation

## 2018-04-13 DIAGNOSIS — Z955 Presence of coronary angioplasty implant and graft: Secondary | ICD-10-CM | POA: Insufficient documentation

## 2018-04-13 DIAGNOSIS — G894 Chronic pain syndrome: Secondary | ICD-10-CM | POA: Diagnosis not present

## 2018-04-13 DIAGNOSIS — M545 Low back pain: Secondary | ICD-10-CM | POA: Insufficient documentation

## 2018-04-13 DIAGNOSIS — K219 Gastro-esophageal reflux disease without esophagitis: Secondary | ICD-10-CM | POA: Insufficient documentation

## 2018-04-13 DIAGNOSIS — I2510 Atherosclerotic heart disease of native coronary artery without angina pectoris: Secondary | ICD-10-CM | POA: Insufficient documentation

## 2018-04-13 DIAGNOSIS — I255 Ischemic cardiomyopathy: Secondary | ICD-10-CM | POA: Diagnosis not present

## 2018-04-13 DIAGNOSIS — Z8249 Family history of ischemic heart disease and other diseases of the circulatory system: Secondary | ICD-10-CM | POA: Insufficient documentation

## 2018-04-13 DIAGNOSIS — E785 Hyperlipidemia, unspecified: Secondary | ICD-10-CM | POA: Diagnosis not present

## 2018-04-13 DIAGNOSIS — G8928 Other chronic postprocedural pain: Secondary | ICD-10-CM | POA: Insufficient documentation

## 2018-04-13 DIAGNOSIS — I252 Old myocardial infarction: Secondary | ICD-10-CM | POA: Insufficient documentation

## 2018-04-13 DIAGNOSIS — Z87891 Personal history of nicotine dependence: Secondary | ICD-10-CM | POA: Diagnosis not present

## 2018-04-13 DIAGNOSIS — M961 Postlaminectomy syndrome, not elsewhere classified: Secondary | ICD-10-CM | POA: Insufficient documentation

## 2018-04-13 DIAGNOSIS — M069 Rheumatoid arthritis, unspecified: Secondary | ICD-10-CM | POA: Diagnosis not present

## 2018-04-13 DIAGNOSIS — M5416 Radiculopathy, lumbar region: Secondary | ICD-10-CM | POA: Diagnosis not present

## 2018-04-13 DIAGNOSIS — Z8042 Family history of malignant neoplasm of prostate: Secondary | ICD-10-CM | POA: Insufficient documentation

## 2018-04-13 DIAGNOSIS — G8929 Other chronic pain: Secondary | ICD-10-CM | POA: Diagnosis present

## 2018-04-13 DIAGNOSIS — Z981 Arthrodesis status: Secondary | ICD-10-CM | POA: Diagnosis not present

## 2018-04-13 DIAGNOSIS — Z5181 Encounter for therapeutic drug level monitoring: Secondary | ICD-10-CM | POA: Diagnosis not present

## 2018-04-13 DIAGNOSIS — Z96641 Presence of right artificial hip joint: Secondary | ICD-10-CM | POA: Diagnosis not present

## 2018-04-13 DIAGNOSIS — Z79899 Other long term (current) drug therapy: Secondary | ICD-10-CM | POA: Insufficient documentation

## 2018-04-13 DIAGNOSIS — M25562 Pain in left knee: Secondary | ICD-10-CM

## 2018-04-13 DIAGNOSIS — Z79891 Long term (current) use of opiate analgesic: Secondary | ICD-10-CM | POA: Diagnosis not present

## 2018-04-13 DIAGNOSIS — E119 Type 2 diabetes mellitus without complications: Secondary | ICD-10-CM | POA: Insufficient documentation

## 2018-04-13 MED ORDER — PREGABALIN 200 MG PO CAPS: 200.0000 mg | ORAL_CAPSULE | Freq: Two times a day (BID) | ORAL | 3 refills | Status: DC

## 2018-04-13 MED ORDER — OXYCODONE ER 36 MG PO C12A: 1.0000 | EXTENDED_RELEASE_CAPSULE | Freq: Two times a day (BID) | ORAL | 0 refills | Status: DC

## 2018-04-13 NOTE — Progress Notes (Signed)
Subjective:    Patient ID: Thomas Mcclure, male    DOB: [birth year], 63 y.o.   MRN: [id number]  HPI: Thomas Mcclure is a 63 y.o. male who returns for follow up appointment for chronic pain and medication refill. He states his pain is located in his lower back radiating into his bilateral lower extremities and bilateral knee pain. She rates her  Pain 7. His current exercise regime is walking for 20 minutes daily.  [name] Morphine equivalent is 108.00 MME. Last UDS was Performed on 03/09/2018, it was consistent.     Pain Inventory Average Pain 8 Pain Right Now 7 My pain is sharp, burning, tingling and aching  In the last 24 hours, has pain interfered with the following? General activity 7 Relation with others 7 Enjoyment of life 7 What TIME of day is your pain at its worst? morning, evening, night Sleep (in general) Good  Pain is worse with: standing, unsure and some activites Pain improves with: rest and medication Relief from Meds: 8  Mobility walk with assistance use a cane use a walker how many minutes can you walk? 20 ability to climb steps?  yes do you drive?  yes Do you have any goals in this area?  yes  Function retired  Neuro/Psych numbness tingling trouble walking spasms  Prior Studies Any changes since last visit?  no  Physicians involved in your care Any changes since last visit?  no   Family History  Problem Relation Age of Onset  . Heart disease Mother   . Prostate cancer Brother    Social History   Socioeconomic History  . Marital status: Married    Spouse name: Not on file  . Number of children: 5  . Years of education: Not on file  . Highest education level: Not on file  Occupational History  . Occupation: Development worker, community, now on disability due to back pain  Social Needs  . Financial resource strain: Not on file  . Food insecurity:    Worry: Not on file    Inability: Not on file  . Transportation needs:    Medical:  Not on file    Non-medical: Not on file  Tobacco Use  . Smoking status: Former Smoker    Packs/day: 0.25    Years: 40.00    Pack years: 10.00    Types: Cigarettes  . Smokeless tobacco: Never Used  . Tobacco comment: 02/18/2017 "quit in 2012"  Substance and Sexual Activity  . Alcohol use: No    Alcohol/week: 0.0 standard drinks  . Drug use: No  . Sexual activity: Not Currently  Lifestyle  . Physical activity:    Days per week: Not on file    Minutes per session: Not on file  . Stress: Not on file  Relationships  . Social connections:    Talks on phone: Not on file    Gets together: Not on file    Attends religious service: Not on file    Active member of club or organization: Not on file    Attends meetings of clubs or organizations: Not on file    Relationship status: Not on file  Other Topics Concern  . Not on file  Social History Narrative  . Not on file   Past Surgical History:  Procedure Laterality Date  . BACK SURGERY    . CARDIAC CATHETERIZATION N/A 07/15/2015   Procedure: Left Heart Cath and Coronary Angiography;  Surgeon: [name], MD;  Location: [facility];  Service: Cardiovascular;  Laterality: N/A;  . CARDIAC CATHETERIZATION N/A 07/15/2015   Procedure: Coronary Stent Intervention;  Surgeon: [name], MD;  Location: [facility];  Service: Cardiovascular;  Laterality: N/A;  . CARDIAC CATHETERIZATION N/A 08/07/2015   Procedure: Left Heart Cath and Coronary Angiography;  Surgeon: [name], MD;  Location: [facility];  Service: Cardiovascular;  Laterality: N/A;  . CORONARY ANGIOPLASTY WITH STENT PLACEMENT  07/2015  . JOINT REPLACEMENT    . LEFT HEART CATH AND CORONARY ANGIOGRAPHY N/A 02/19/2017   Procedure: LEFT HEART CATH AND CORONARY ANGIOGRAPHY;  Surgeon: Martinique, Peter M, MD;  Location: [facility];  Service: Cardiovascular;  Laterality: N/A;  . LUMBAR SPINE SURGERY  03/2009  & 2012  . MASS EXCISION N/A 04/19/2012    Procedure: removal of posterior cervical lipoma;  Surgeon: [name], MD;  Location: [facility];  Service: Neurosurgery;  Laterality: N/A;  Removal of posterior cervical lipoma  . POPLITEAL SYNOVIAL CYST EXCISION Left    "opened up behind my knee; scraped out arthritis"  . SPINAL CORD STIMULATOR INSERTION N/A 01/07/2013   Procedure:  SPINAL CORD STIMULATOR INSERTION;  Surgeon: [name], MD;  [facility]: [facility];  Service: Neurosurgery;  Laterality: N/A;  . TONSILLECTOMY    . TOTAL HIP ARTHROPLASTY Right 10/03/2016   Procedure: TOTAL HIP ARTHROPLASTY;  Surgeon: [name], MD;  Location: [facility];  Service: Orthopedics;  Laterality: Right;  . WISDOM TOOTH EXTRACTION     hx of   Past Medical History:  Diagnosis Date  . Baker's cyst    Left calf  . CAD in native artery, 07/15/15 PCI of RCA with DES 07/16/2015   a.   NSTEMI 5/17: LHC - pLAD 20, pLCx 20, OM1 30, pRCA 100, EF 45-50%>> PCI: 2.5 x 24 mm Promus DES to RCA  //  b.   Echo 5/17: mild LVH, EF 50-55%, no RWMA, mod RVE //  c. LHC 6/17: pLAD 20, pLCx 20, OM1 50, pRCA stent ok, EF 35-45% with mod sized inf wall and basal segment aneurysm  . Chronic back pain    "down my back, down my legs" (02/18/2017)  . Constipation   . GERD (gastroesophageal reflux disease)   . Hyperlipidemia   . Hypertension    Dr. [name] [phone]  . Ischemic cardiomyopathy    a. LV-gram at time of LHC in 6/17 with EF 35-45%  //  b. Echo 7/17: EF 45-50%, inferior HK, grade 1 diastolic dysfunction, mildly dilated aortic root, moderately reduced RVSF, mild RAE  . Myocardial infarction ([place]) 2017  . 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 Numbness and tingling of both lower extremities    "on the outside of both sides" (02/18/2017)  . Rheumatoid arthritis ([place])   . Type II diabetes mellitus (HCC)    diet controlled   BP 111/75 (BP Location: Left Arm, Patient Position: Sitting, Cuff Size: Large)   Pulse 98   Resp 14   Ht  5\' 10"  (1.778 m)   Wt 260 lb (117.9 kg)   SpO2 94%   BMI 37.31 kg/m   Opioid Risk Score:   Fall Risk Score:  `1  Depression screen PHQ 2/9  Depression screen [facility] 2/9 02/11/2018 12/07/2017 10/15/2017 01/28/2016 11/12/2015  Decreased Interest 0 0 1 0 0  Down, Depressed, Hopeless 0 0 0 0 0  PHQ - 2 Score 0 0  1 0 0  Altered sleeping - - 1 - -  Tired, decreased energy - - 1 - -  Change in appetite - - 0 - -  Feeling bad or failure about yourself  - - 0 - -  Trouble concentrating - - 0 - -  Moving slowly or fidgety/restless - - 0 - -  Suicidal thoughts - - 0 - -  PHQ-9 Score - - 3 - -  Some recent data might be hidden    Review of Systems  Constitutional: Negative.   HENT: Negative.   Eyes: Negative.   Respiratory: Negative.   Cardiovascular: Negative.   Gastrointestinal: Negative.   Endocrine:       High blood sugar  Genitourinary: Negative.   Musculoskeletal: Positive for arthralgias, back pain and gait problem.       Spasms   Skin: Negative.   Allergic/Immunologic: Negative.   Neurological: Positive for numbness.       Tingling  Hematological: Negative.   Psychiatric/Behavioral: Negative.   All other systems reviewed and are negative.      Objective:   Physical Exam Vitals signs and nursing note reviewed.  Constitutional:      Appearance: Normal appearance.  Neck:     Musculoskeletal: Normal range of motion and neck supple.  Cardiovascular:     Rate and Rhythm: Normal rate and regular rhythm.     Pulses: Normal pulses.     Heart sounds: Normal heart sounds.  Pulmonary:     Effort: Pulmonary effort is normal.     Breath sounds: Normal breath sounds.  Musculoskeletal:     Comments: Normal Muscle Bulk and Muscle Testing Reveals:  Upper Extremities:Full ROM and Muscle Strength 5/5 Thoracic Paraspinal Tenderness: T-7-T-9 Lumbar Hypersensitivity Lower Extremities: Full ROM and Muscle Strength 5/5 Arises from Table Slowly using walker for support Narrow Based  Gait   Skin:    General: Skin is warm and dry.  Neurological:     Mental Status: He is alert and oriented to person, place, and time.  Psychiatric:        Mood and Affect: Mood normal.        Behavior: Behavior normal.           Assessment & Plan:  1. Lumbar Post-Laminectomy Syndrome/ Chronic Post-operative pain.: Continue HEP as Tolerated. Continue to Monitor. 04/13/2018. 2. Lumbar Radiculitis: Continue Lyrica. Continue to monitor.04/13/2018 3. Bilateral Hip Pain:No complaints today.Continue HEP as Tolerated . Continue to Monitor. 04/13/2018 4. Chronic Pain Syndrome: Refilled: Xtampza ER36Mg  one tablet every 12 hours #60.  We will continue the opioid monitoring program, this consists of regular clinic visits, examinations, urine drug screen, pill counts as well as use of [place] Controlled Substance Reporting system.  F/U in 1 month   59minutes of face to face patient care time was spent during this visit. All questions were encouraged and answered.

## 2018-04-19 ENCOUNTER — Ambulatory Visit
Admission: RE | Admit: 2018-04-19 | Discharge: 2018-04-19 | Disposition: A | Discharge status: Home / Self Care | Payer: Medicare Other | Source: Ambulatory Visit | Attending: Family Medicine | Admitting: Family Medicine

## 2018-04-19 ENCOUNTER — Other Ambulatory Visit: Payer: Self-pay | Admitting: Family Medicine

## 2018-04-19 DIAGNOSIS — R053 Chronic cough: Secondary | ICD-10-CM

## 2018-04-19 DIAGNOSIS — R05 Cough: Secondary | ICD-10-CM

## 2018-04-21 IMAGING — XA DG MYELOGRAPHY LUMBAR INJ LUMBOSACRAL
13 of 17 series · 13 of 17 positions shown · non-contrast
Comparison: Lumbar spine radiographs 04/11/2016.

CLINICAL DATA: Progressive bilateral lower extremity weakness,
right greater than left.
TECHNIQUE: Contiguous axial images were obtained through the Lumbar spine after
the intrathecal infusion of infusion. Coronal and sagittal
reconstructions were obtained of the axial image sets.

[Series 1: w lumbar spine lat · 0.15mm/px · 1 of 1 slices shown]
[im 1/1]
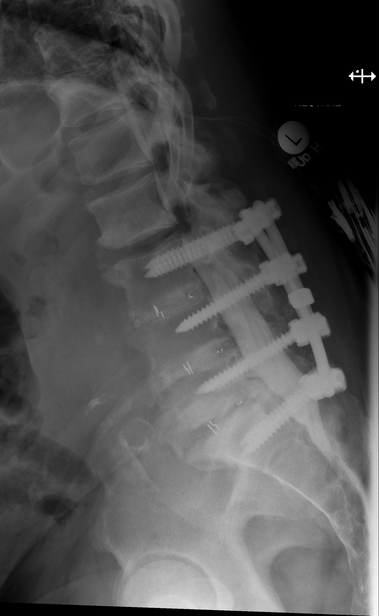

[Series 1: vasc adipose · 1 of 1 slices shown (1 of 10)]
[im 1/1]
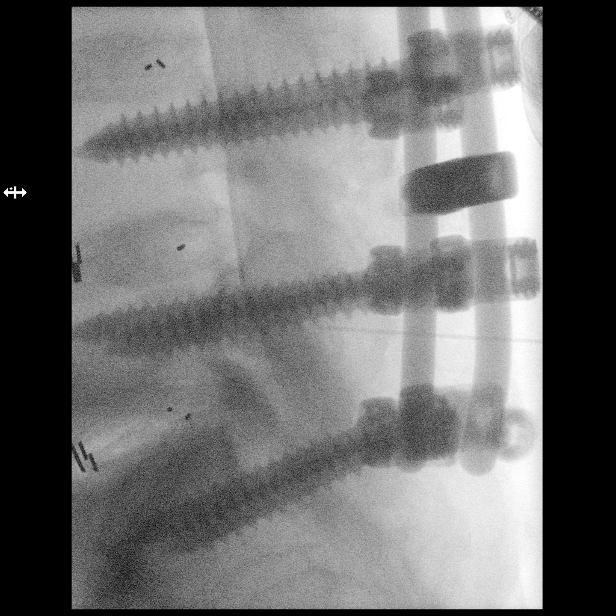

[Series 2: w lumbar spine flexion · 0.15mm/px · 1 of 1 slices shown]
[im 1/1]
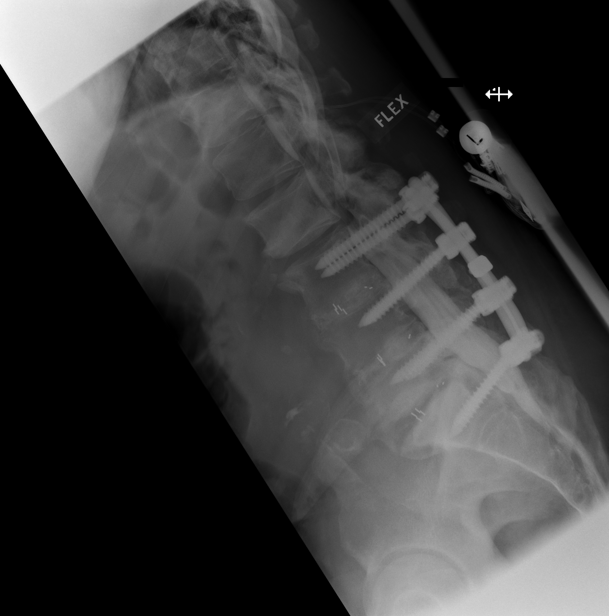

[Series 3: vasc adipose · 1 of 1 slices shown (2 of 10)]
[im 1/1]
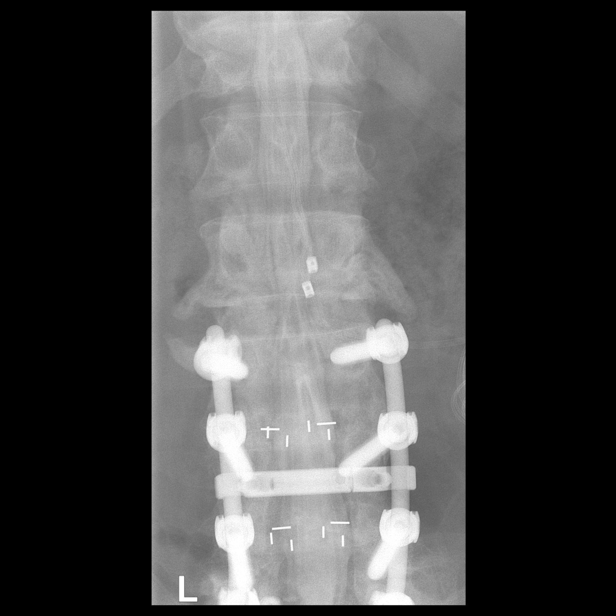

[Series 3: w lumbar spine extension · 0.15mm/px · 1 of 1 slices shown]
[im 1/1]
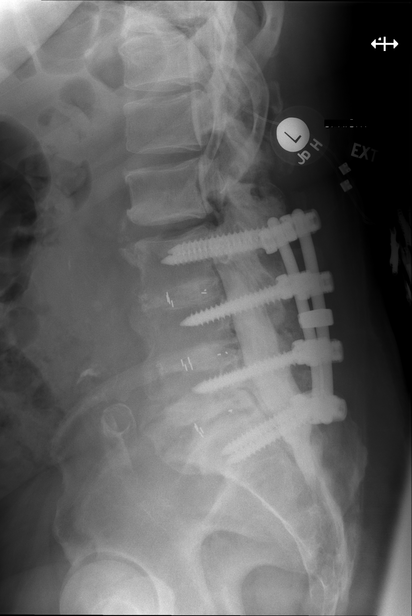

[Series 5: vasc adipose · 1 of 1 slices shown (3 of 10)]
[im 1/1]
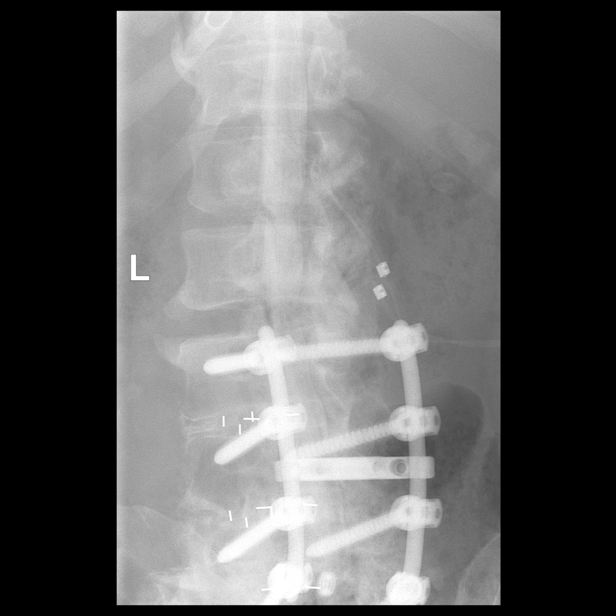

[Series 6: vasc adipose · 1 of 1 slices shown (4 of 10)]
[im 1/1]
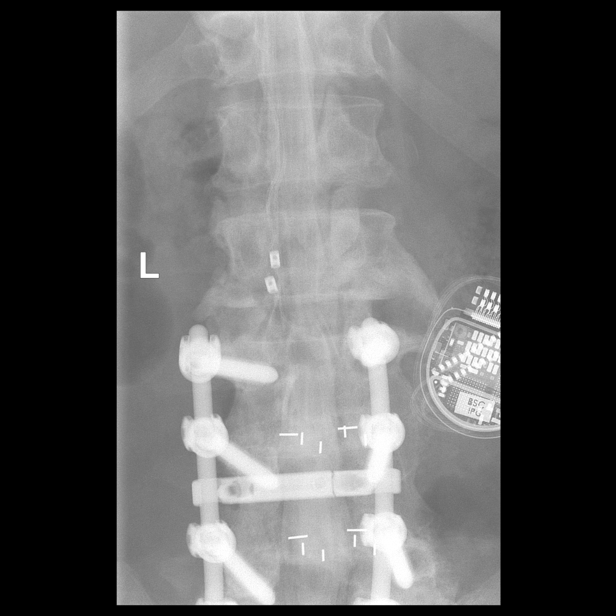

[Series 7: vasc adipose · 1 of 1 slices shown (5 of 10)]
[im 1/1]
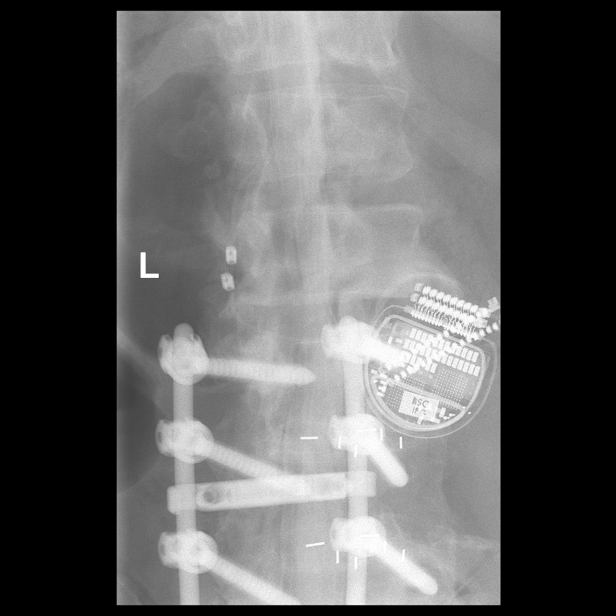

[Series 9: vasc adipose · 1 of 1 slices shown (6 of 10)]
[im 1/1]
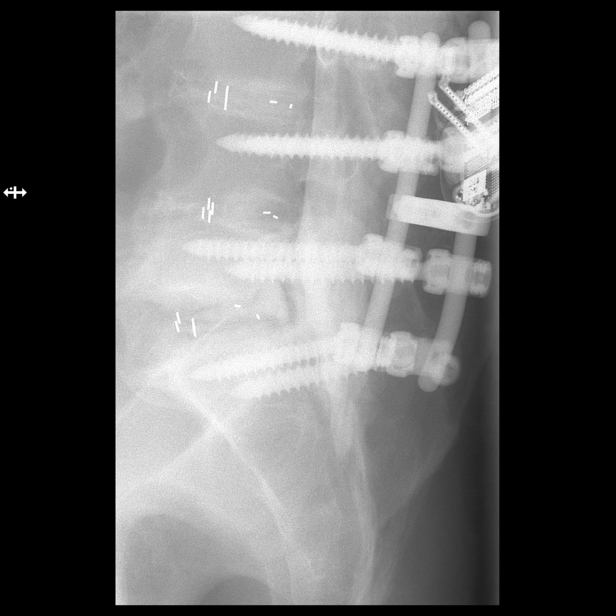

[Series 10: vasc adipose · 1 of 1 slices shown (7 of 10)]
[im 1/1]
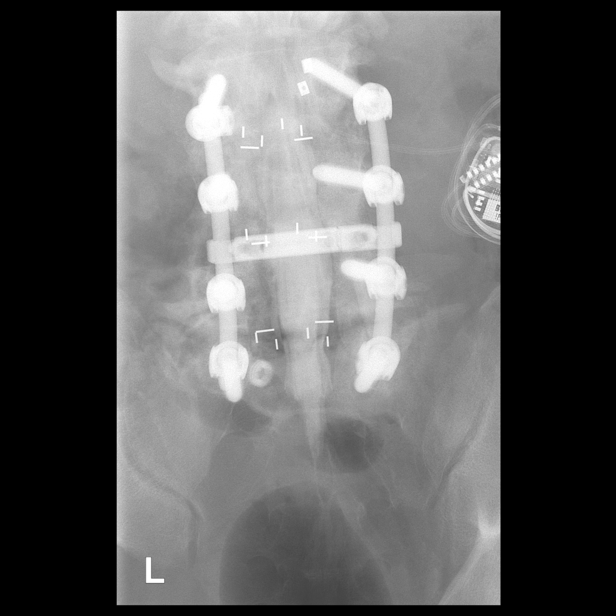

[Series 11: vasc adipose · 1 of 1 slices shown (8 of 10)]
[im 1/1]
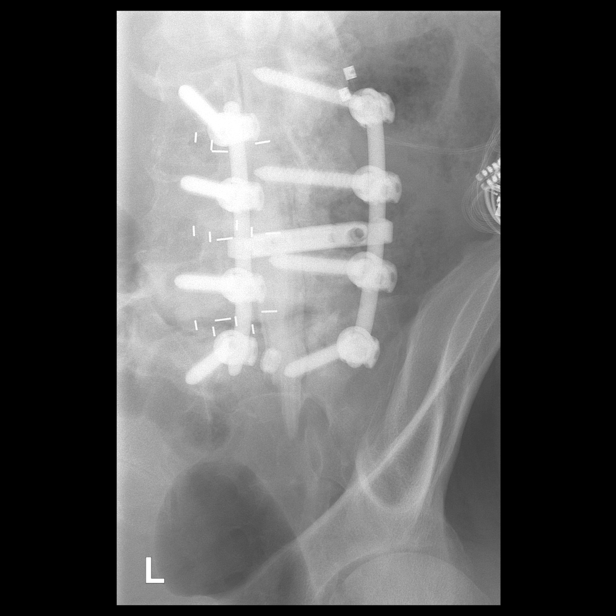

[Series 13: vasc adipose · 1 of 1 slices shown (9 of 10)]
[im 1/1]
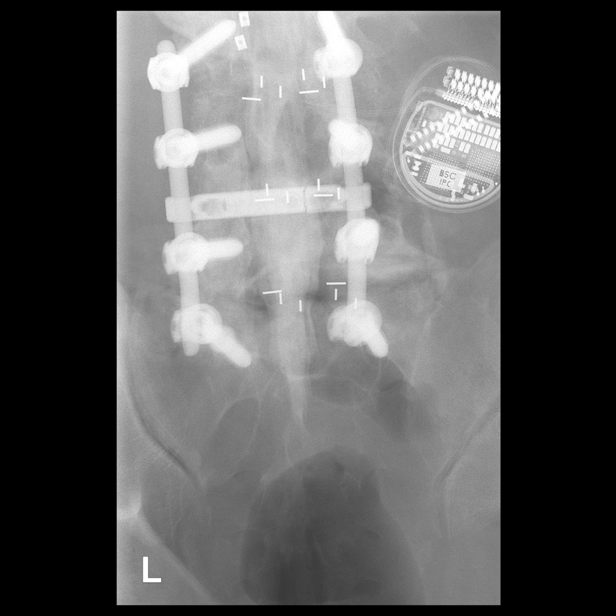

[Series 14: vasc adipose · 1 of 1 slices shown (10 of 10)]
[im 1/1]
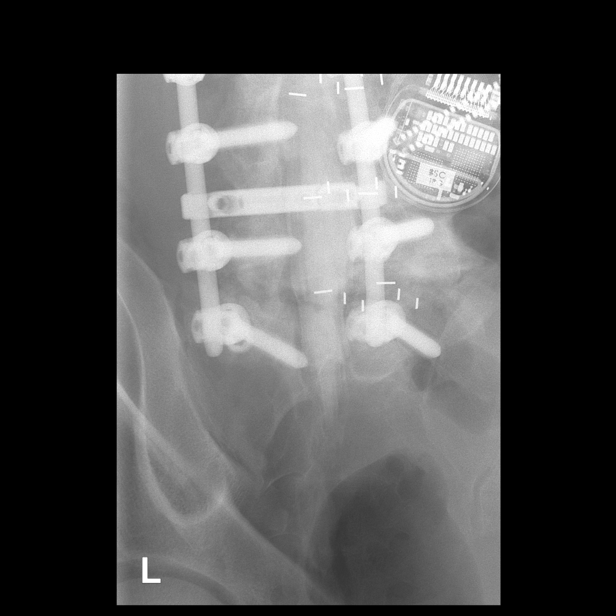

[13 of 17 positions shown; findings below may reference images not displayed]

EXAM:
LUMBAR MYELOGRAM

FLUOROSCOPY TIME:  Radiation Exposure Index (as provided by the
fluoroscopic device): 703.05 uGy*m2

Fluoroscopy Time:  1 minutes 11 seconds

Number of Acquired Images:  15

PROCEDURE:
After thorough discussion of risks and benefits of the procedure
including bleeding, infection, injury to nerves, blood vessels,
adjacent structures as well as headache and CSF leak, written and
oral informed consent was obtained. Consent was obtained by Dr.
Xio Rosee. Time out form was completed.

Patient was positioned prone on the fluoroscopy table. Local
anesthesia was provided with 1% lidocaine without epinephrine after
prepped and draped in the usual sterile fashion. Puncture was
performed at L5 using a 3 1/2 inch 22-gauge spinal needle via
midline approach. Using a single pass through the dura, the needle
was placed within the thecal sac, with return of clear CSF. 15 mL of
Isovue M 200 was injected into the thecal sac, with normal
opacification of the nerve roots and cauda equina consistent with
free flow within the subarachnoid space.

I personally performed the lumbar puncture and administered the
intrathecal contrast. I also personally supervised acquisition of
the myelogram images.
MRI lumbar spine
without and with contrast 07/26/2012. Lumbar myelogram 05/16/2011.
FINDINGS: LUMBAR MYELOGRAM FINDINGS:

Progressive adjacent level disease is present at L to 3. There is
further loss of disc height and endplate sclerotic changes well.
Moderate central canal stenosis is also present at L1-2. The
narrowing at L2-3 is worse with standing. There is no abnormal
motion with flexion or extension.

CT LUMBAR MYELOGRAM FINDINGS:

The lumbar spine is imaged from the midbody of T12 through the
midbody of S3. Five non rib-bearing lumbar type vertebral bodies are
present. Conus medullaris terminates at L1-2.

Solid osseous fusion is present anteriorly and posteriorly at L3-4
and L4-5. The there significant lucency surrounding the right
pedicle screw at S1 suggesting loosening. Gas is present in the disc
space at L5-S1.

Limited imaging of the abdomen demonstrates atherosclerosis of the
aorta extending into the iliac artery is. No significant adenopathy
is present.

L1-2: A broad-based disc protrusion is similar to the prior study.
There is progression of advanced facet hypertrophy. Prominent
epidural fat contributes to crowding of the nerve roots. Mild
foraminal narrowing is present bilaterally, right greater than left.

L2-3: A broad-based disc protrusion has progressed. Advanced facet
hypertrophy has progressed as well. The there is progressive central
canal stenosis. Progressive severe foraminal stenosis is also
evident, right greater than left.

L3-4: Solid fusion is present. No residual recurrent stenosis is
evident.

L4-5: Solid fusion is present. A wide laminectomy is noted. There is
no residual recurrent stenosis.

L5-S1: A wide laminectomy is present. Severe foraminal stenosis is
again noted bilaterally, right greater than left
IMPRESSION: 1. Solid fusion at L3-4 and L4-5 without significant stenosis at
these levels.
2. Progressive facet disease and disc protrusion at L2-3 with
progressive moderate central canal stenosis and severe foraminal
narrowing.
3. Progressive severe facet arthropathy and foraminal stenosis at
L5-S1 with nonunion at this level.
4. Epidural lipomatosis at L1-2 and L2-3 contributes to crowding of
the nerve roots.

## 2018-04-25 NOTE — Progress Notes (Signed)
Cardiology Office Note   Date:  04/26/2018   ID:  COSIMO SCHERTZER, DOB 22-Jan-1956, MRN [id number]  PCP:  Vassie Moment, MD  Cardiologist:  [name]    Chief Complaint  Patient presents with  . Coronary Artery Disease      History of Present Illness: Thomas Mcclure is a 63 y.o. male who presents for CAD   hx of history of NSTEMI in 5-2017status post DES to the RCA. 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 Cath in 2018 demonstrated patent stent.  + back pain.    Pt goals: LDL less than 70, hemoglobin A1c less than 7, blood pressure target 130/80 mmHg.  Today he is doing well, no chest pain and no SOB.  He is losing wt and exercising. With the exercise he feels better.  His wife is with him today. She has him walk the dog and has been reducing amount of food.   BP is good, no recent lipids  Past Medical History:  Diagnosis Date  . Baker's cyst    Left calf  . CAD in native artery, 07/15/15 PCI of RCA with DES 07/16/2015   a.   NSTEMI 5/17: LHC - pLAD 20, pLCx 20, OM1 30, pRCA 100, EF 45-50%>> PCI: 2.5 x 24 mm Promus DES to RCA  //  b.   Echo 5/17: mild LVH, EF 50-55%, no RWMA, mod RVE //  c. LHC 6/17: pLAD 20, pLCx 20, OM1 50, pRCA stent ok, EF 35-45% with mod sized inf wall and basal segment aneurysm  . Chronic back pain    "down my back, down my legs" (02/18/2017)  . Constipation   . GERD (gastroesophageal reflux disease)   . Hyperlipidemia   . Hypertension    Dr. [name] [phone]  . Ischemic cardiomyopathy    a. LV-gram at time of LHC in 6/17 with EF 35-45%  //  b. Echo 7/17: EF 45-50%, inferior HK, grade 1 diastolic dysfunction, mildly dilated aortic root, moderately reduced RVSF, mild RAE  . Myocardial infarction ([place]) 2017  . Neuromuscular  disorder ([place])    "with nerve damage"  . Numbness and tingling of both lower extremities    "on the outside of both sides" (02/18/2017)  . Rheumatoid arthritis ([place])   . Type II diabetes mellitus ([place])    diet controlled    Past Surgical History:  Procedure Laterality Date  . BACK SURGERY    . CARDIAC CATHETERIZATION N/A 07/15/2015   Procedure: Left Heart Cath and Coronary Angiography;  Surgeon: [name], MD;  Location: [facility];  Service: Cardiovascular;  Laterality: N/A;  . CARDIAC CATHETERIZATION N/A 07/15/2015   Procedure: Coronary Stent Intervention;  Surgeon: [name], MD;  Location: [facility];  Service: Cardiovascular;  Laterality: N/A;  . CARDIAC CATHETERIZATION N/A 08/07/2015   Procedure: Left Heart Cath and Coronary Angiography;  Surgeon: [name], MD;  Location: [facility];  Service: Cardiovascular;  Laterality: N/A;  . CORONARY ANGIOPLASTY WITH STENT PLACEMENT  07/2015  . JOINT REPLACEMENT    . LEFT HEART CATH AND CORONARY ANGIOGRAPHY N/A 02/19/2017   Procedure: LEFT HEART CATH AND CORONARY ANGIOGRAPHY;  Surgeon: Martinique, Peter M, MD;  Location: [facility];  Service: Cardiovascular;  Laterality: N/A;  . LUMBAR  SPINE SURGERY  03/2009  & 2012  . MASS EXCISION N/A 04/19/2012   Procedure: removal of posterior cervical lipoma;  Surgeon: [name], MD;  Location: [facility];  Service: Neurosurgery;  Laterality: N/A;  Removal of posterior cervical lipoma  . POPLITEAL SYNOVIAL CYST EXCISION Left    "opened up behind my knee; scraped out arthritis"  . SPINAL CORD STIMULATOR INSERTION N/A 01/07/2013   Procedure:  SPINAL CORD STIMULATOR INSERTION;  Surgeon: [name], MD;  Location: [facility];  Service: Neurosurgery;  Laterality: N/A;  . TONSILLECTOMY    . TOTAL HIP ARTHROPLASTY Right 10/03/2016   Procedure: TOTAL HIP ARTHROPLASTY;  Surgeon: [name], MD;  Location: [facility];  Service: Orthopedics;  Laterality: Right;    . WISDOM TOOTH EXTRACTION     hx of     Current Outpatient Medications  Medication Sig Dispense Refill  . aspirin EC 81 MG tablet Take 1 tablet (81 mg total) by mouth daily. 30 tablet 1  . atorvastatin (LIPITOR) 80 MG tablet Take 80 mg by mouth daily at 6 PM.    . chlorthalidone (HYGROTON) 25 MG tablet Take 1 tablet (25 mg total) by mouth daily. 90 tablet 2  . cyclobenzaprine (FLEXERIL) 10 MG tablet TAKE 1 TABLET(10 MG) BY MOUTH DAILY AS NEEDED FOR MUSCLE SPASMS 60 tablet 0  . DILT-XR 180 MG 24 hr capsule Take 180 mg by mouth once daily  4  . DULoxetine (CYMBALTA) 60 MG capsule Take 1 capsule (60 mg total) by mouth daily. 90 capsule 0  . furosemide (LASIX) 40 MG tablet Take one tablet by mouth daily for 3 days 3 tablet 0  . glipiZIDE (GLUCOTROL) 5 MG tablet Take by mouth daily before breakfast.    . lisinopril (PRINIVIL,ZESTRIL) 20 MG tablet Take 20 mg by mouth at bedtime.   3  . lubiprostone (AMITIZA) 24 MCG capsule Take 24 mcg by mouth 2 (two) times daily with a meal.    . metFORMIN (GLUCOPHAGE XR) 500 MG 24 hr tablet Take 2 tablets (1,000 mg total) by mouth 2 (two) times daily. 360 tablet 0  . Multiple Vitamin (MULTIVITAMIN WITH MINERALS) TABS tablet Take 1 tablet by mouth daily.    . nitroGLYCERIN (NITROSTAT) 0.4 MG SL tablet Place 0.4 mg under the tongue every 5 (five) minutes as needed for chest pain.    . nitroGLYCERIN (NITROSTAT) 0.4 MG SL tablet Place 1 tablet (0.4 mg total) under the tongue every 5 (five) minutes as needed for chest pain. 25 tablet 4  . omeprazole (PRILOSEC) 40 MG capsule Take 1 capsule (40 mg total) by mouth daily. 90 capsule 0  . oxyCODONE ER (XTAMPZA ER) 36 MG C12A Take 1 capsule (36 mg total) by mouth every 12 (twelve) hours. 60 each 0  . polyethylene glycol powder (GLYCOLAX/MIRALAX) powder Take 17 g by mouth daily. Mix in 8 oz water, juice, soda, coffee or tea and drink (Patient taking differently: Take 17 g by mouth as needed. Mix in 8 oz water, juice, soda,  coffee or tea and drink) 250 g 0  . potassium chloride SA (K-DUR,KLOR-CON) 20 MEQ tablet Take 1 tablet (20 mEq total) by mouth daily. 90 tablet 1  . pregabalin (LYRICA) 200 MG capsule Take 1 capsule (200 mg total) by mouth 2 (two) times daily. 60 capsule 3  . senna-docusate (SENOKOT S) 8.6-50 MG tablet Take 1 tablet by mouth 2 (two) times daily.     No current facility-administered medications for this visit.  Allergies:   Brilinta [ticagrelor]; Methylprednisolone; Prednisone; Toradol [ketorolac tromethamine]; and Adhesive [tape]    Social History:  The patient  reports that he has quit smoking. His smoking use included cigarettes. He has a 10.00 pack-year smoking history. He has never used smokeless tobacco. He reports that he does not drink alcohol or use drugs.   Family History:  The patient's family history includes Heart disease in his mother; Prostate cancer in his brother.    ROS:  General:no colds or fevers, no weight changes Skin:no rashes or ulcers HEENT:no blurred vision, no congestion CV:see HPI PUL:see HPI GI:no diarrhea constipation or melena, no indigestion GU:no hematuria, no dysuria MS:no joint pain, no claudication, chronic back pain Neuro:no syncope, no lightheadedness Endo:+ diabetes, no thyroid disease  Wt Readings from Last 3 Encounters:  04/26/18 260 lb 12.8 oz (118.3 kg)  04/13/18 260 lb (117.9 kg)  03/09/18 261 lb (118.4 kg)     PHYSICAL EXAM: VS:  BP 128/80   Pulse 97   Ht 6' (1.829 m)   Wt 260 lb 12.8 oz (118.3 kg)   SpO2 95%   BMI 35.37 kg/m  , BMI Body mass index is 35.37 kg/m. General:Pleasant affect, NAD Skin:Warm and dry, brisk capillary refill HEENT:normocephalic, sclera clear, mucus membranes moist Neck:supple, no JVD, no bruits  Heart:S1S2 RRR without murmur, gallup, rub or click Lungs:clear without rales, rhonchi, or wheezes GYK:ZLDJ, non tender, + BS, do not palpate liver spleen or masses Ext:no lower ext edema, 2+ pedal pulses,  2+ radial pulses Neuro:alert and oriented X 3, MAE, follows commands, + facial symmetry    EKG:  EKG is ordered today. The ekg ordered today demonstrates ST at 104 mild LVH, old inf MI, no acute changes   Recent Labs: 05/31/2017: Hemoglobin 8.9; Platelets 257 11/04/2017: ALT 29; BUN 13; Creatinine, Ser 1.03; NT-Pro BNP 90; Potassium 4.0; Sodium 138 11/06/2017: Magnesium 2.0    Lipid Panel    Component Value Date/Time   CHOL 107 02/19/2017 0222   CHOL 118 05/13/2016 0831   TRIG 131 02/19/2017 0222   HDL 25 (L) 02/19/2017 0222   HDL 32 (L) 05/13/2016 0831   CHOLHDL 4.3 02/19/2017 0222   VLDL 26 02/19/2017 0222   LDLCALC 56 02/19/2017 0222   LDLCALC 65 05/13/2016 0831       Other studies Reviewed: Additional studies/ records that were reviewed today include:  Cardiac Cath 02/19/17 .  Prox LAD to Mid LAD lesion is 20% stenosed.  Prox Cx to Mid Cx lesion is 20% stenosed.  Ost 1st Mrg to 1st Mrg lesion is 50% stenosed.  Non-stenotic Prox RCA lesion previously treated.  The left ventricular systolic function is normal.  LV end diastolic pressure is normal.  The left ventricular ejection fraction is 50-55% by visual estimate.   1. Nonobstructive CAD. Patent stent in the RCA.  2. Overall good LV function 3. Normal LVEDP  Plan: continue medical therapy  outpt monitoring   Normal sinus rhythm  No significant pauses or arrhythmia   Normal monitor.  No significant arrhythmia noted.  Echo 12/2015 Study Conclusions  - Left ventricle: The cavity size was normal. There was mild   concentric hypertrophy. Systolic function was normal. The   estimated ejection fraction was in the range of 55% to 60%. There   is akinesis of the basal-midinferior myocardium. Doppler   parameters are consistent with abnormal left ventricular   relaxation (grade 1 diastolic dysfunction).  ASSESSMENT AND PLAN:  1.  CAD without angina,  exercising and losing wt.    2.  Chronic  combines systolic and diastolic HF stable.  euvolemic  3.  DM type 2 stable.    4. HTN controlled  5.  HLD controlled check lipids today and CMP  6.  Fatigue will check TSH  Follow up with [name] in 6 months.    Current medicines are reviewed with the patient today.  The patient Has no concerns regarding medicines.  The following changes have been made:  See above Labs/ tests ordered today include:see above  Disposition:   FU:  see above  Signed, Cecilie Kicks, NP  04/26/2018 10:07 AM    [facility], [place], [place], [place] Phone: [phone]; Fax: [phone]

## 2018-04-26 ENCOUNTER — Ambulatory Visit (INDEPENDENT_AMBULATORY_CARE_PROVIDER_SITE_OTHER): Payer: Medicare Other | Admitting: Cardiology

## 2018-04-26 ENCOUNTER — Encounter: Payer: Self-pay | Admitting: Cardiology

## 2018-04-26 VITALS — BP 128/80 | HR 97 | Ht 72.0 in | Wt 260.8 lb

## 2018-04-26 DIAGNOSIS — R5383 Other fatigue: Secondary | ICD-10-CM | POA: Diagnosis not present

## 2018-04-26 DIAGNOSIS — E785 Hyperlipidemia, unspecified: Secondary | ICD-10-CM | POA: Diagnosis not present

## 2018-04-26 DIAGNOSIS — I10 Essential (primary) hypertension: Secondary | ICD-10-CM | POA: Diagnosis not present

## 2018-04-26 DIAGNOSIS — E118 Type 2 diabetes mellitus with unspecified complications: Secondary | ICD-10-CM

## 2018-04-26 DIAGNOSIS — I2510 Atherosclerotic heart disease of native coronary artery without angina pectoris: Secondary | ICD-10-CM | POA: Diagnosis not present

## 2018-04-26 LAB — COMPREHENSIVE METABOLIC PANEL
ALT: 39 IU/L (ref 0–44)
AST: 30 IU/L (ref 0–40)
Albumin/Globulin Ratio: 1.8 (ref 1.2–2.2)
Albumin: 4.7 g/dL (ref 3.8–4.8)
Alkaline Phosphatase: 104 IU/L (ref 39–117)
BUN/Creatinine Ratio: 15 (ref 10–24)
BUN: 19 mg/dL (ref 8–27)
Bilirubin Total: 0.3 mg/dL (ref 0.0–1.2)
CO2: 21 mmol/L (ref 20–29)
Calcium: 9.8 mg/dL (ref 8.6–10.2)
Chloride: 99 mmol/L (ref 96–106)
Creatinine, Ser: 1.27 mg/dL (ref 0.76–1.27)
GFR calc Af Amer: 70 mL/min/{1.73_m2} (ref >59)
GFR calc non Af Amer: 60 mL/min/{1.73_m2} (ref >59)
Globulin, Total: 2.6 g/dL (ref 1.5–4.5)
Glucose: 142 mg/dL — ABNORMAL HIGH (ref 65–99)
Potassium: 4.2 mmol/L (ref 3.5–5.2)
Sodium: 137 mmol/L (ref 134–144)
Total Protein: 7.3 g/dL (ref 6.0–8.5)

## 2018-04-26 LAB — LIPID PANEL
Chol/HDL Ratio: 3.6 ratio (ref 0.0–5.0)
Cholesterol, Total: 111 mg/dL (ref 100–199)
HDL: 31 mg/dL — ABNORMAL LOW (ref >39)
LDL Calculated: 57 mg/dL (ref 0–99)
Triglycerides: 114 mg/dL (ref 0–149)
VLDL Cholesterol Cal: 23 mg/dL (ref 5–40)

## 2018-04-26 LAB — TSH: TSH: 0.981 u[IU]/mL (ref 0.450–4.500)

## 2018-04-26 MED ORDER — NITROGLYCERIN 0.4 MG SL SUBL: 0.4000 mg | SUBLINGUAL_TABLET | SUBLINGUAL | 4 refills | Status: DC | PRN

## 2018-04-26 NOTE — Patient Instructions (Signed)
Medication Instructions:  No changes If you need a refill on your cardiac medications before your next appointment, please call your pharmacy.   Lab work: Today: lipids/cmet/tsh If you have labs (blood work) drawn today and your tests are completely normal, you will receive your results only by: Marland Kitchen MyChart Message (if you have MyChart) OR . A paper copy in the mail If you have any lab test that is abnormal or we need to change your treatment, we will call you to review the results.  Testing/Procedures: none  Follow-Up: At [facility], you and your health needs are our priority.  As part of our continuing mission to provide you with exceptional heart care, we have created designated Provider Care Teams.  These Care Teams include your primary Cardiologist (physician) and Advanced Practice Providers (APPs -  Physician Assistants and Nurse Practitioners) who all work together to provide you with the care you need, when you need it. You will need a follow up appointment in 6 months.  Please call our office 2 months in advance to schedule this appointment.  You may see Sinclair Grooms, MD or one of the following Advanced Practice Providers on your designated Care Team:   Truitt Merle, NP Cecilie Kicks, NP . Kathyrn Drown, NP  Any Other Special Instructions Will Be Listed Below (If Applicable).

## 2018-05-11 ENCOUNTER — Encounter: Payer: Medicare Other | Attending: Physical Medicine & Rehabilitation | Admitting: Registered Nurse

## 2018-05-11 ENCOUNTER — Other Ambulatory Visit: Payer: Self-pay

## 2018-05-11 ENCOUNTER — Encounter: Payer: Self-pay | Admitting: Registered Nurse

## 2018-05-11 VITALS — BP 146/90 | HR 94 | Ht 72.0 in | Wt 267.6 lb

## 2018-05-11 DIAGNOSIS — Z9689 Presence of other specified functional implants: Secondary | ICD-10-CM | POA: Insufficient documentation

## 2018-05-11 DIAGNOSIS — Z87891 Personal history of nicotine dependence: Secondary | ICD-10-CM | POA: Diagnosis not present

## 2018-05-11 DIAGNOSIS — E119 Type 2 diabetes mellitus without complications: Secondary | ICD-10-CM | POA: Diagnosis not present

## 2018-05-11 DIAGNOSIS — M545 Low back pain: Secondary | ICD-10-CM | POA: Insufficient documentation

## 2018-05-11 DIAGNOSIS — K219 Gastro-esophageal reflux disease without esophagitis: Secondary | ICD-10-CM | POA: Insufficient documentation

## 2018-05-11 DIAGNOSIS — I2510 Atherosclerotic heart disease of native coronary artery without angina pectoris: Secondary | ICD-10-CM | POA: Diagnosis not present

## 2018-05-11 DIAGNOSIS — I10 Essential (primary) hypertension: Secondary | ICD-10-CM | POA: Insufficient documentation

## 2018-05-11 DIAGNOSIS — E785 Hyperlipidemia, unspecified: Secondary | ICD-10-CM | POA: Diagnosis not present

## 2018-05-11 DIAGNOSIS — M961 Postlaminectomy syndrome, not elsewhere classified: Secondary | ICD-10-CM | POA: Insufficient documentation

## 2018-05-11 DIAGNOSIS — Z955 Presence of coronary angioplasty implant and graft: Secondary | ICD-10-CM | POA: Insufficient documentation

## 2018-05-11 DIAGNOSIS — M069 Rheumatoid arthritis, unspecified: Secondary | ICD-10-CM | POA: Diagnosis not present

## 2018-05-11 DIAGNOSIS — G8928 Other chronic postprocedural pain: Secondary | ICD-10-CM | POA: Insufficient documentation

## 2018-05-11 DIAGNOSIS — Z8042 Family history of malignant neoplasm of prostate: Secondary | ICD-10-CM | POA: Diagnosis not present

## 2018-05-11 DIAGNOSIS — Z96641 Presence of right artificial hip joint: Secondary | ICD-10-CM | POA: Insufficient documentation

## 2018-05-11 DIAGNOSIS — I255 Ischemic cardiomyopathy: Secondary | ICD-10-CM | POA: Insufficient documentation

## 2018-05-11 DIAGNOSIS — G8929 Other chronic pain: Secondary | ICD-10-CM | POA: Diagnosis present

## 2018-05-11 DIAGNOSIS — Z79899 Other long term (current) drug therapy: Secondary | ICD-10-CM | POA: Insufficient documentation

## 2018-05-11 DIAGNOSIS — I252 Old myocardial infarction: Secondary | ICD-10-CM | POA: Insufficient documentation

## 2018-05-11 DIAGNOSIS — M5416 Radiculopathy, lumbar region: Secondary | ICD-10-CM | POA: Diagnosis not present

## 2018-05-11 DIAGNOSIS — Z5181 Encounter for therapeutic drug level monitoring: Secondary | ICD-10-CM

## 2018-05-11 DIAGNOSIS — Z981 Arthrodesis status: Secondary | ICD-10-CM | POA: Insufficient documentation

## 2018-05-11 DIAGNOSIS — G894 Chronic pain syndrome: Secondary | ICD-10-CM | POA: Diagnosis not present

## 2018-05-11 DIAGNOSIS — Z8249 Family history of ischemic heart disease and other diseases of the circulatory system: Secondary | ICD-10-CM | POA: Diagnosis not present

## 2018-05-11 DIAGNOSIS — Z79891 Long term (current) use of opiate analgesic: Secondary | ICD-10-CM | POA: Insufficient documentation

## 2018-05-11 MED ORDER — OXYCODONE ER 36 MG PO C12A: 1.0000 | EXTENDED_RELEASE_CAPSULE | Freq: Two times a day (BID) | ORAL | 0 refills | Status: DC

## 2018-05-11 NOTE — Progress Notes (Signed)
Subjective:    Patient ID: Thomas Mcclure, male    DOB: [birth year], 63 y.o.   MRN: [id number]  HPI: Thomas Mcclure is a 63 y.o. male who returns for follow up appointment for chronic pain and medication refill. He states his  pain is located in his lower back radiating into his right lower extremity and right foot pain with tingling and burning. He rates his pain 8. His. current exercise regime is walking and performing stretching exercises.  Thomas Mcclure Morphine equivalent is 108.00  MME.  Last UDS was Performed on 03/09/2018, it was consistent.   Pain Inventory Average Pain 8 Pain Right Now 8 My pain is sharp, burning, stabbing, tingling and aching  In the last 24 hours, has pain interfered with the following? General activity 7 Relation with others 7 Enjoyment of life 7 What TIME of day is your pain at its worst? daytime Sleep (in general) Fair  Pain is worse with: walking, bending, sitting and standing Pain improves with: medication Relief from Meds: 9  Mobility use a cane  Function retired  Neuro/Psych numbness tingling  Prior Studies Any changes since last visit?  no  Physicians involved in your care Any changes since last visit?  no   Family History  Problem Relation Age of Onset  . Heart disease Mother   . Prostate cancer Brother    Social History   Socioeconomic History  . Marital status: Married    Spouse name: Not on file  . Number of children: 5  . Years of education: Not on file  . Highest education level: Not on file  Occupational History  . Occupation: Development worker, community, now on disability due to back pain  Social Needs  . Financial resource strain: Not on file  . Food insecurity:    Worry: Not on file    Inability: Not on file  . Transportation needs:    Medical: Not on file    Non-medical: Not on file  Tobacco Use  . Smoking status: Former Smoker    Packs/day: 0.25    Years: 40.00    Pack years: 10.00    Types: Cigarettes   . Smokeless tobacco: Never Used  . Tobacco comment: 02/18/2017 "quit in 2012"  Substance and Sexual Activity  . Alcohol use: No    Alcohol/week: 0.0 standard drinks  . Drug use: No  . Sexual activity: Not Currently  Lifestyle  . Physical activity:    Days per week: Not on file    Minutes per session: Not on file  . Stress: Not on file  Relationships  . Social connections:    Talks on phone: Not on file    Gets together: Not on file    Attends religious service: Not on file    Active member of club or organization: Not on file    Attends meetings of clubs or organizations: Not on file    Relationship status: Not on file  Other Topics Concern  . Not on file  Social History Narrative  . Not on file   Past Surgical History:  Procedure Laterality Date  . BACK SURGERY    . CARDIAC CATHETERIZATION N/A 07/15/2015   Procedure: Left Heart Cath and Coronary Angiography;  Surgeon: [name], MD;  Location: [place] [facility];  Service: Cardiovascular;  Laterality: N/A;  . CARDIAC CATHETERIZATION N/A 07/15/2015   Procedure: Coronary Stent Intervention;  Surgeon: [name], MD;  Location: [place] [facility];  Service: Cardiovascular;  Laterality: N/A;  . CARDIAC CATHETERIZATION N/A 08/07/2015   Procedure: Left Heart Cath and Coronary Angiography;  Surgeon: [name], MD;  Location: [place] [facility];  Service: Cardiovascular;  Laterality: N/A;  . CORONARY ANGIOPLASTY WITH STENT PLACEMENT  07/2015  . JOINT REPLACEMENT    . LEFT HEART CATH AND CORONARY ANGIOGRAPHY N/A 02/19/2017   Procedure: LEFT HEART CATH AND CORONARY ANGIOGRAPHY;  Surgeon: Martinique, Peter M, MD;  Location: [place] [facility];  Service: Cardiovascular;  Laterality: N/A;  . LUMBAR SPINE SURGERY  03/2009  & 2012  . MASS EXCISION N/A 04/19/2012   Procedure: removal of posterior cervical lipoma;  Surgeon: [name], MD;  Location: [place] [facility];  Service: Neurosurgery;  Laterality: N/A;  Removal of posterior  cervical lipoma  . POPLITEAL SYNOVIAL CYST EXCISION Left    "opened up behind my knee; scraped out arthritis"  . SPINAL CORD STIMULATOR INSERTION N/A 01/07/2013   Procedure:  SPINAL CORD STIMULATOR INSERTION;  Surgeon: [name], MD;  Location: [facility];  Service: Neurosurgery;  Laterality: N/A;  . TONSILLECTOMY    . TOTAL HIP ARTHROPLASTY Right 10/03/2016   Procedure: TOTAL HIP ARTHROPLASTY;  Surgeon: [name], MD;  Location: [place];  Service: Orthopedics;  Laterality: Right;  . WISDOM TOOTH EXTRACTION     hx of   Past Medical History:  Diagnosis Date  . Baker's cyst    Left calf  . CAD in native artery, 07/15/15 PCI of RCA with DES 07/16/2015   a.   NSTEMI 5/17: LHC - pLAD 20, pLCx 20, OM1 30, pRCA 100, EF 45-50%>> PCI: 2.5 x 24 mm Promus DES to RCA  //  b.   Echo 5/17: mild LVH, EF 50-55%, no RWMA, mod RVE //  c. LHC 6/17: pLAD 20, pLCx 20, OM1 50, pRCA stent ok, EF 35-45% with mod sized inf wall and basal segment aneurysm  . Chronic back pain    "down my back, down my legs" (02/18/2017)  . Constipation   . GERD (gastroesophageal reflux disease)   . Hyperlipidemia   . Hypertension    Dr. [name] [phone]  . Ischemic cardiomyopathy    a. LV-gram at time of LHC in 6/17 with EF 35-45%  //  b. Echo 7/17: EF 45-50%, inferior HK, grade 1 diastolic dysfunction, mildly dilated aortic root, moderately reduced RVSF, mild RAE  . Myocardial infarction ([place]) 2017  . 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 Numbness and tingling of both lower extremities    "on the outside of both sides" (02/18/2017)  . Rheumatoid arthritis ([place])   . Type II diabetes mellitus (HCC)    diet controlled   BP (!) 146/90   Pulse 94   Ht 6' (1.829 m)   Wt 267 lb 9.6 oz (121.4 kg)   SpO2 93%   BMI 36.29 kg/m   Opioid Risk Score:   Fall Risk Score:  `1  Depression screen PHQ 2/9  Depression screen [facility] 2/9 02/11/2018 12/07/2017 10/15/2017 01/28/2016 11/12/2015  Decreased  Interest 0 0 1 0 0  Down, Depressed, Hopeless 0 0 0 0 0  PHQ - 2 Score 0 0 1 0 0  Altered sleeping - - 1 - -  Tired, decreased energy - - 1 - -  Change in appetite - - 0 - -  Feeling bad or failure about yourself  - - 0 - -  Trouble concentrating - - 0 - -  Moving slowly or fidgety/restless - - 0 - -  Suicidal thoughts - - 0 - -  PHQ-9 Score - - 3 - -  Some recent data might be hidden    Review of Systems  Constitutional: Negative.   HENT: Negative.   Eyes: Negative.   Respiratory: Negative.   Cardiovascular: Negative.   Gastrointestinal: Negative.   Endocrine: Negative.   Genitourinary: Negative.   Musculoskeletal: Negative.   Skin: Negative.   Allergic/Immunologic: Negative.   Neurological: Negative.   Hematological: Negative.   Psychiatric/Behavioral: Negative.   All other systems reviewed and are negative.      Objective:   Physical Exam Vitals signs and nursing note reviewed.  Constitutional:      Appearance: Normal appearance.  Neck:     Musculoskeletal: Normal range of motion and neck supple.  Cardiovascular:     Rate and Rhythm: Normal rate and regular rhythm.     Pulses: Normal pulses.     Heart sounds: Normal heart sounds.  Pulmonary:     Effort: Pulmonary effort is normal.     Breath sounds: Normal breath sounds.  Musculoskeletal:     Comments: Normal Muscle Bulk and Muscle Testing Reveals:  Upper Extremities: Full ROM and Muscle Strength 5/5  Lumbar Paraspinal Tenderness: L-3-L-5 Lower Extremities: Decreased ROM and Muscle Strength 5/5 Arises from Table slowly  Narrow Based Gait   Skin:    General: Skin is warm and dry.  Neurological:     Mental Status: He is alert and oriented to person, place, and time.  Psychiatric:        Mood and Affect: Mood normal.        Behavior: Behavior normal.           Assessment & Plan:  1. Lumbar Post-Laminectomy Syndrome/ Chronic Post-operative pain.: Continue HEP as Tolerated. Continue to Monitor.  05/11/2018. 2. Lumbar Radiculitis: Continue Lyrica. Continue to monitor.05/11/2018 3. Bilateral Hip Pain:No complaints today.Continue HEP as Tolerated . Continue to Monitor.05/11/2018 4. Chronic Pain Syndrome: Refilled: Xtampza ER36Mg  one tablet every 12 hours #60.  We will continue the opioid monitoring program, this consists of regular clinic visits, examinations, urine drug screen, pill counts as well as use of [place] Controlled Substance Reporting system.  F/U in 1 month   44minutes of face to face patient care time was spent during this visit. All questions were encouraged and answered.

## 2018-05-24 ENCOUNTER — Other Ambulatory Visit: Payer: Self-pay | Admitting: Interventional Cardiology

## 2018-05-24 MED ORDER — POTASSIUM CHLORIDE CRYS ER 20 MEQ PO TBCR: 20.0000 meq | EXTENDED_RELEASE_TABLET | Freq: Every day | ORAL | 3 refills | Status: DC

## 2018-05-24 NOTE — Telephone Encounter (Signed)
Pt's medication was sent to pt's pharmacy as requested. Confirmation received.  °

## 2018-06-10 ENCOUNTER — Encounter: Payer: Self-pay | Admitting: Registered Nurse

## 2018-06-10 ENCOUNTER — Other Ambulatory Visit: Payer: Self-pay

## 2018-06-10 ENCOUNTER — Encounter: Payer: Medicare Other | Attending: Physical Medicine & Rehabilitation | Admitting: Registered Nurse

## 2018-06-10 VITALS — BP 131/78 | HR 80 | Ht 72.0 in | Wt 267.0 lb

## 2018-06-10 DIAGNOSIS — M069 Rheumatoid arthritis, unspecified: Secondary | ICD-10-CM | POA: Insufficient documentation

## 2018-06-10 DIAGNOSIS — G8928 Other chronic postprocedural pain: Secondary | ICD-10-CM | POA: Insufficient documentation

## 2018-06-10 DIAGNOSIS — Z79891 Long term (current) use of opiate analgesic: Secondary | ICD-10-CM

## 2018-06-10 DIAGNOSIS — I255 Ischemic cardiomyopathy: Secondary | ICD-10-CM | POA: Insufficient documentation

## 2018-06-10 DIAGNOSIS — Z79899 Other long term (current) drug therapy: Secondary | ICD-10-CM | POA: Insufficient documentation

## 2018-06-10 DIAGNOSIS — Z5181 Encounter for therapeutic drug level monitoring: Secondary | ICD-10-CM | POA: Diagnosis not present

## 2018-06-10 DIAGNOSIS — E119 Type 2 diabetes mellitus without complications: Secondary | ICD-10-CM | POA: Insufficient documentation

## 2018-06-10 DIAGNOSIS — I2510 Atherosclerotic heart disease of native coronary artery without angina pectoris: Secondary | ICD-10-CM | POA: Insufficient documentation

## 2018-06-10 DIAGNOSIS — M5416 Radiculopathy, lumbar region: Secondary | ICD-10-CM

## 2018-06-10 DIAGNOSIS — M25561 Pain in right knee: Secondary | ICD-10-CM

## 2018-06-10 DIAGNOSIS — E785 Hyperlipidemia, unspecified: Secondary | ICD-10-CM | POA: Insufficient documentation

## 2018-06-10 DIAGNOSIS — Z8042 Family history of malignant neoplasm of prostate: Secondary | ICD-10-CM | POA: Insufficient documentation

## 2018-06-10 DIAGNOSIS — M961 Postlaminectomy syndrome, not elsewhere classified: Secondary | ICD-10-CM | POA: Diagnosis not present

## 2018-06-10 DIAGNOSIS — M545 Low back pain: Secondary | ICD-10-CM | POA: Insufficient documentation

## 2018-06-10 DIAGNOSIS — K219 Gastro-esophageal reflux disease without esophagitis: Secondary | ICD-10-CM | POA: Insufficient documentation

## 2018-06-10 DIAGNOSIS — Z955 Presence of coronary angioplasty implant and graft: Secondary | ICD-10-CM | POA: Insufficient documentation

## 2018-06-10 DIAGNOSIS — I252 Old myocardial infarction: Secondary | ICD-10-CM | POA: Insufficient documentation

## 2018-06-10 DIAGNOSIS — Z9689 Presence of other specified functional implants: Secondary | ICD-10-CM | POA: Insufficient documentation

## 2018-06-10 DIAGNOSIS — G894 Chronic pain syndrome: Secondary | ICD-10-CM | POA: Diagnosis not present

## 2018-06-10 DIAGNOSIS — G8929 Other chronic pain: Secondary | ICD-10-CM

## 2018-06-10 DIAGNOSIS — Z96641 Presence of right artificial hip joint: Secondary | ICD-10-CM | POA: Insufficient documentation

## 2018-06-10 DIAGNOSIS — Z87891 Personal history of nicotine dependence: Secondary | ICD-10-CM | POA: Insufficient documentation

## 2018-06-10 DIAGNOSIS — I10 Essential (primary) hypertension: Secondary | ICD-10-CM | POA: Insufficient documentation

## 2018-06-10 DIAGNOSIS — Z981 Arthrodesis status: Secondary | ICD-10-CM | POA: Insufficient documentation

## 2018-06-10 DIAGNOSIS — Z8249 Family history of ischemic heart disease and other diseases of the circulatory system: Secondary | ICD-10-CM | POA: Insufficient documentation

## 2018-06-10 DIAGNOSIS — M25562 Pain in left knee: Secondary | ICD-10-CM

## 2018-06-10 MED ORDER — OXYCODONE ER 36 MG PO C12A: 1.0000 | EXTENDED_RELEASE_CAPSULE | Freq: Two times a day (BID) | ORAL | 0 refills | Status: DC

## 2018-06-10 NOTE — Progress Notes (Signed)
Subjective:    Patient ID: Thomas Mcclure, male    DOB: 01-10-1956, 63 y.o.   MRN: [id number]  HPI: Thomas Mcclure is a 63 y.o. male his appointment was changed, due to national recommendations of social distancing due to [place], an audio/video telehealth visit is felt to be most appropriate for this patient at this time.  See Chart message from today for the patient's consent to telehealth from [place].     He states his pain is located in his lower back radiating into his bilateral hips and bilateral lower extremities. Also reports bilateral knee pain R>L. He rates his pain 7. His  current exercise regime is walking and performing stretching exercises.  Mr. Sheahan Morphine equivalent is 108.00MME.  Last UDS was Performed on 03/09/2018, it was consistent.   Geryl Rankins CMA asked the Health and History questions. Thus provider and Cheri Rous  verified we were  speaking with the correct person using two identifiers.   Pain Inventory Average Pain 8 Pain Right Now 7 My pain is constant, sharp, burning and tingling  In the last 24 hours, has pain interfered with the following? General activity 6 Relation with others 5 Enjoyment of life 4 What TIME of day is your pain at its worst? varies with activity Sleep (in general) Fair  Pain is worse with: walking, bending, standing and some activites Pain improves with: rest, medication and TENS Relief from Meds: 10  Mobility walk with assistance use a cane how many minutes can you walk? 15 ability to climb steps?  yes do you drive?  yes  Function disabled: date disabled .  Neuro/Psych weakness numbness trouble walking spasms  Prior Studies Any changes since last visit?  no  Physicians involved in your care Any changes since last visit?  no   Family History  Problem Relation Age of Onset  . Heart disease Mother   . Prostate cancer Brother    Social History   Socioeconomic  History  . Marital status: Married    Spouse name: Not on file  . Number of children: 5  . Years of education: Not on file  . Highest education level: Not on file  Occupational History  . Occupation: Development worker, community, now on disability due to back pain  Social Needs  . Financial resource strain: Not on file  . Food insecurity:    Worry: Not on file    Inability: Not on file  . Transportation needs:    Medical: Not on file    Non-medical: Not on file  Tobacco Use  . Smoking status: Former Smoker    Packs/day: 0.25    Years: 40.00    Pack years: 10.00    Types: Cigarettes  . Smokeless tobacco: Never Used  . Tobacco comment: 02/18/2017 "quit in 2012"  Substance and Sexual Activity  . Alcohol use: No    Alcohol/week: 0.0 standard drinks  . Drug use: No  . Sexual activity: Not Currently  Lifestyle  . Physical activity:    Days per week: Not on file    Minutes per session: Not on file  . Stress: Not on file  Relationships  . Social connections:    Talks on phone: Not on file    Gets together: Not on file    Attends religious service: Not on file    Active member of club or organization: Not on file    Attends meetings of clubs or organizations:  Not on file    Relationship status: Not on file  Other Topics Concern  . Not on file  Social History Narrative  . Not on file   Past Surgical History:  Procedure Laterality Date  . BACK SURGERY    . CARDIAC CATHETERIZATION N/A 07/15/2015   Procedure: Left Heart Cath and Coronary Angiography;  Surgeon: [name], MD;  Location: [facility];  Service: Cardiovascular;  Laterality: N/A;  . CARDIAC CATHETERIZATION N/A 07/15/2015   Procedure: Coronary Stent Intervention;  Surgeon: [name], MD;  Location: [place] [facility];  Service: Cardiovascular;  Laterality: N/A;  . CARDIAC CATHETERIZATION N/A 08/07/2015   Procedure: Left Heart Cath and Coronary Angiography;  Surgeon: [name], MD;  Location: [place] [facility];  Service: Cardiovascular;  Laterality: N/A;  . CORONARY ANGIOPLASTY WITH STENT PLACEMENT  07/2015  . JOINT REPLACEMENT    . LEFT HEART CATH AND CORONARY ANGIOGRAPHY N/A 02/19/2017   Procedure: LEFT HEART CATH AND CORONARY ANGIOGRAPHY;  Surgeon: Martinique, Peter M, MD;  Location: [place] [facility];  Service: Cardiovascular;  Laterality: N/A;  . LUMBAR SPINE SURGERY  03/2009  & 2012  . MASS EXCISION N/A 04/19/2012   Procedure: removal of posterior cervical lipoma;  Surgeon: [name], MD;  Location: [facility];  Service: Neurosurgery;  Laterality: N/A;  Removal of posterior cervical lipoma  . POPLITEAL SYNOVIAL CYST EXCISION Left    "opened up behind my knee; scraped out arthritis"  . SPINAL CORD STIMULATOR INSERTION N/A 01/07/2013   Procedure:  SPINAL CORD STIMULATOR INSERTION;  Surgeon: [name], MD;  Location: [facility];  Service: Neurosurgery;  Laterality: N/A;  . TONSILLECTOMY    . TOTAL HIP ARTHROPLASTY Right 10/03/2016   Procedure: TOTAL HIP ARTHROPLASTY;  Surgeon: [name], MD;  Location: [facility];  Service: Orthopedics;  Laterality: Right;  . WISDOM TOOTH EXTRACTION     hx of   Past Medical History:  Diagnosis Date  . Baker's cyst    Left calf  . CAD in native artery, 07/15/15 PCI of RCA with DES 07/16/2015   a.   NSTEMI 5/17: LHC - pLAD 20, pLCx 20, OM1 30, pRCA 100, EF 45-50%>> PCI: 2.5 x 24 mm Promus DES to RCA  //  b.   Echo 5/17: mild LVH, EF 50-55%, no RWMA, mod RVE //  c. LHC 6/17: pLAD 20, pLCx 20, OM1 50, pRCA stent ok, EF 35-45% with mod sized inf wall and basal segment aneurysm  . Chronic back pain    "down my back, down my legs" (02/18/2017)  . Constipation   . GERD (gastroesophageal reflux disease)   . Hyperlipidemia   . Hypertension    Dr. [name] [phone]  . Ischemic cardiomyopathy    a. LV-gram at time of LHC in 6/17 with EF 35-45%  //  b. Echo 7/17: EF 45-50%, inferior HK, grade 1 diastolic dysfunction, mildly dilated  aortic root, moderately reduced RVSF, mild RAE  . Myocardial infarction ([place]) 2017  . 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 Numbness and tingling of both lower extremities    "on the outside of both sides" (02/18/2017)  . Rheumatoid arthritis ([place])   . Type II diabetes mellitus (HCC)    diet controlled   Ht 6' (1.829 m)   Wt 267 lb (121.1 kg)   BMI 36.21 kg/m   Opioid Risk Score:   Fall Risk Score:  `1  Depression screen PHQ 2/9  Depression screen [facility] 2/9 02/11/2018 12/07/2017 10/15/2017 01/28/2016 11/12/2015  Decreased Interest 0 0 1 0 0  Down, Depressed, Hopeless 0 0 0 0 0  PHQ - 2 Score 0 0 1 0 0  Altered sleeping - - 1 - -  Tired, decreased energy - - 1 - -  Change in appetite - - 0 - -  Feeling bad or failure about yourself  - - 0 - -  Trouble concentrating - - 0 - -  Moving slowly or fidgety/restless - - 0 - -  Suicidal thoughts - - 0 - -  PHQ-9 Score - - 3 - -  Some recent data might be hidden    Review of Systems  Constitutional: Negative.   HENT: Negative.   Eyes: Negative.   Respiratory: Negative.   Cardiovascular: Negative.   Gastrointestinal: Negative.   Endocrine: Negative.   Genitourinary: Negative.   Musculoskeletal: Positive for arthralgias, back pain and gait problem.  Skin: Negative.   Allergic/Immunologic: Negative.   Neurological: Positive for weakness and numbness.       Tingling  Hematological: Negative.   Psychiatric/Behavioral: Negative.   All other systems reviewed and are negative.      Objective:   Physical Exam Vitals signs and nursing note reviewed.  Musculoskeletal:     Comments: No Physical Exam: Virtual Exam   Neurological:     Mental Status: He is oriented to person, place, and time.           Assessment & Plan:  1. Lumbar Post-Laminectomy Syndrome/ Chronic Post-operative pain.: Continue HEP as Tolerated. Continue to Monitor. 06/11/2018. 2. Lumbar Radiculitis: Continue Lyrica. Continue to  monitor.06/11/2018 3. Bilateral Hip Pain:Continue HEP as Tolerated . Continue to Monitor.06/11/2018 4. Chronic Pain Syndrome: Refilled: Xtampza ER36Mg  one tablet every 12 hours #60.  We will continue the opioid monitoring program, this consists of regular clinic visits, examinations, urine drug screen, pill counts as well as use of [place] Controlled Substance Reporting system.  F/U in 1 month  Telephone Call  Location of patient: In his Home  Location of provider: Office Established patient Time spent on call: 11 minutes

## 2018-07-06 ENCOUNTER — Other Ambulatory Visit: Payer: Self-pay

## 2018-07-06 ENCOUNTER — Encounter: Payer: Self-pay | Admitting: Registered Nurse

## 2018-07-06 ENCOUNTER — Encounter: Payer: Medicare Other | Attending: Physical Medicine & Rehabilitation | Admitting: Registered Nurse

## 2018-07-06 VITALS — BP 141/87 | Temp 98.3°F | Ht 69.0 in | Wt 263.0 lb

## 2018-07-06 DIAGNOSIS — M961 Postlaminectomy syndrome, not elsewhere classified: Secondary | ICD-10-CM | POA: Diagnosis not present

## 2018-07-06 DIAGNOSIS — M545 Low back pain: Secondary | ICD-10-CM | POA: Insufficient documentation

## 2018-07-06 DIAGNOSIS — E785 Hyperlipidemia, unspecified: Secondary | ICD-10-CM | POA: Insufficient documentation

## 2018-07-06 DIAGNOSIS — Z87891 Personal history of nicotine dependence: Secondary | ICD-10-CM | POA: Insufficient documentation

## 2018-07-06 DIAGNOSIS — G894 Chronic pain syndrome: Secondary | ICD-10-CM

## 2018-07-06 DIAGNOSIS — Z96641 Presence of right artificial hip joint: Secondary | ICD-10-CM | POA: Insufficient documentation

## 2018-07-06 DIAGNOSIS — M069 Rheumatoid arthritis, unspecified: Secondary | ICD-10-CM | POA: Insufficient documentation

## 2018-07-06 DIAGNOSIS — I2510 Atherosclerotic heart disease of native coronary artery without angina pectoris: Secondary | ICD-10-CM | POA: Insufficient documentation

## 2018-07-06 DIAGNOSIS — Z955 Presence of coronary angioplasty implant and graft: Secondary | ICD-10-CM | POA: Insufficient documentation

## 2018-07-06 DIAGNOSIS — E119 Type 2 diabetes mellitus without complications: Secondary | ICD-10-CM | POA: Insufficient documentation

## 2018-07-06 DIAGNOSIS — Z8042 Family history of malignant neoplasm of prostate: Secondary | ICD-10-CM | POA: Insufficient documentation

## 2018-07-06 DIAGNOSIS — Z5181 Encounter for therapeutic drug level monitoring: Secondary | ICD-10-CM | POA: Diagnosis not present

## 2018-07-06 DIAGNOSIS — G8929 Other chronic pain: Secondary | ICD-10-CM

## 2018-07-06 DIAGNOSIS — M5416 Radiculopathy, lumbar region: Secondary | ICD-10-CM

## 2018-07-06 DIAGNOSIS — Z79899 Other long term (current) drug therapy: Secondary | ICD-10-CM | POA: Insufficient documentation

## 2018-07-06 DIAGNOSIS — I10 Essential (primary) hypertension: Secondary | ICD-10-CM | POA: Insufficient documentation

## 2018-07-06 DIAGNOSIS — Z981 Arthrodesis status: Secondary | ICD-10-CM | POA: Insufficient documentation

## 2018-07-06 DIAGNOSIS — Z8249 Family history of ischemic heart disease and other diseases of the circulatory system: Secondary | ICD-10-CM | POA: Insufficient documentation

## 2018-07-06 DIAGNOSIS — Z79891 Long term (current) use of opiate analgesic: Secondary | ICD-10-CM | POA: Insufficient documentation

## 2018-07-06 DIAGNOSIS — K219 Gastro-esophageal reflux disease without esophagitis: Secondary | ICD-10-CM | POA: Insufficient documentation

## 2018-07-06 DIAGNOSIS — G8928 Other chronic postprocedural pain: Secondary | ICD-10-CM | POA: Insufficient documentation

## 2018-07-06 DIAGNOSIS — I252 Old myocardial infarction: Secondary | ICD-10-CM | POA: Insufficient documentation

## 2018-07-06 DIAGNOSIS — Z9689 Presence of other specified functional implants: Secondary | ICD-10-CM | POA: Insufficient documentation

## 2018-07-06 DIAGNOSIS — I255 Ischemic cardiomyopathy: Secondary | ICD-10-CM | POA: Insufficient documentation

## 2018-07-06 DIAGNOSIS — M25561 Pain in right knee: Secondary | ICD-10-CM

## 2018-07-06 DIAGNOSIS — M25562 Pain in left knee: Secondary | ICD-10-CM

## 2018-07-06 MED ORDER — OXYCODONE ER 36 MG PO C12A: 1.0000 | EXTENDED_RELEASE_CAPSULE | Freq: Two times a day (BID) | ORAL | 0 refills | Status: DC

## 2018-07-06 NOTE — Progress Notes (Signed)
Subjective:    Patient ID: Thomas Mcclure, male    DOB: Oct 17, 1955, 63 y.o.   MRN: [id number]  HPI: Thomas Mcclure is a 63 y.o. male his appointment was changed, due to national recommendations of social distancing due to [place] 19, an audio/video telehealth visit is felt to be most appropriate for this patient at this time.  See Chart message from today for the patient's consent to telehealth from [place].     He states his pain is located in his lower back and bilateral knees. He rates his pain 8. His current exercise regime is walking.  Thomas Mcclure Morphine equivalent is  108.00MME.  Last UDS was Performed on 03/09/2018, it was consistent.   Thomas Mcclure CMA asked the Health and History Questions. This provider and Thomas Mcclure  verified we were speaking with the correct person using two identifiers.   Pain Inventory Average Pain 8 Pain Right Now 8 My pain is constant, sharp and stabbing  In the last 24 hours, has pain interfered with the following? General activity 7 Relation with others 7 Enjoyment of life 7 What TIME of day is your pain at its worst? evening Sleep (in general) Fair  Pain is worse with: sitting, standing and some activites Pain improves with: rest and medication Relief from Meds: 9  Mobility walk without assistance walk with assistance use a cane ability to climb steps?  yes do you drive?  yes  Function disabled: date disabled 2009  Neuro/Psych weakness numbness tingling trouble walking spasms  Prior Studies Any changes since last visit?  no  Physicians involved in your care Any changes since last visit?  no   Family History  Problem Relation Age of Onset  . Heart disease Mother   . Prostate cancer Brother    Social History   Socioeconomic History  . Marital status: Married    Spouse name: Not on file  . Number of children: 5  . Years of education: Not on file  . Highest education level: Not  on file  Occupational History  . Occupation: Development worker, community, now on disability due to back pain  Social Needs  . Financial resource strain: Not on file  . Food insecurity:    Worry: Not on file    Inability: Not on file  . Transportation needs:    Medical: Not on file    Non-medical: Not on file  Tobacco Use  . Smoking status: Former Smoker    Packs/day: 0.25    Years: 40.00    Pack years: 10.00    Types: Cigarettes  . Smokeless tobacco: Never Used  . Tobacco comment: 02/18/2017 "quit in 2012"  Substance and Sexual Activity  . Alcohol use: No    Alcohol/week: 0.0 standard drinks  . Drug use: No  . Sexual activity: Not Currently  Lifestyle  . Physical activity:    Days per week: Not on file    Minutes per session: Not on file  . Stress: Not on file  Relationships  . Social connections:    Talks on phone: Not on file    Gets together: Not on file    Attends religious service: Not on file    Active member of club or organization: Not on file    Attends meetings of clubs or organizations: Not on file    Relationship status: Not on file  Other Topics Concern  . Not on file  Social History Narrative  .  Not on file   Past Surgical History:  Procedure Laterality Date  . BACK SURGERY    . CARDIAC CATHETERIZATION N/A 07/15/2015   Procedure: Left Heart Cath and Coronary Angiography;  Surgeon: [name], MD;  Location: [facility];  Service: Cardiovascular;  Laterality: N/A;  . CARDIAC CATHETERIZATION N/A 07/15/2015   Procedure: Coronary Stent Intervention;  Surgeon: [name], MD;  Location: [facility];  Service: Cardiovascular;  Laterality: N/A;  . CARDIAC CATHETERIZATION N/A 08/07/2015   Procedure: Left Heart Cath and Coronary Angiography;  Surgeon: [name], MD;  Location: [facility];  Service: Cardiovascular;  Laterality: N/A;  . CORONARY ANGIOPLASTY WITH STENT PLACEMENT  07/2015  . JOINT REPLACEMENT    . LEFT HEART CATH AND  CORONARY ANGIOGRAPHY N/A 02/19/2017   Procedure: LEFT HEART CATH AND CORONARY ANGIOGRAPHY;  Surgeon: Martinique, Peter M, MD;  Location: [facility];  Service: Cardiovascular;  Laterality: N/A;  . LUMBAR SPINE SURGERY  03/2009  & 2012  . MASS EXCISION N/A 04/19/2012   Procedure: removal of posterior cervical lipoma;  Surgeon: [name], MD;  Location: [facility];  Service: Neurosurgery;  Laterality: N/A;  Removal of posterior cervical lipoma  . POPLITEAL SYNOVIAL CYST EXCISION Left    "opened up behind my knee; scraped out arthritis"  . SPINAL CORD STIMULATOR INSERTION N/A 01/07/2013   Procedure:  SPINAL CORD STIMULATOR INSERTION;  Surgeon: [name], MD;  Location: [facility];  Service: Neurosurgery;  Laterality: N/A;  . TONSILLECTOMY    . TOTAL HIP ARTHROPLASTY Right 10/03/2016   Procedure: TOTAL HIP ARTHROPLASTY;  Surgeon: [name], MD;  Location: [facility];  Service: Orthopedics;  Laterality: Right;  . WISDOM TOOTH EXTRACTION     hx of   Past Medical History:  Diagnosis Date  . Baker's cyst    Left calf  . CAD in native artery, 07/15/15 PCI of RCA with DES 07/16/2015   a.   NSTEMI 5/17: LHC - pLAD 20, pLCx 20, OM1 30, pRCA 100, EF 45-50%>> PCI: 2.5 x 24 mm Promus DES to RCA  //  b.   Echo 5/17: mild LVH, EF 50-55%, no RWMA, mod RVE //  c. LHC 6/17: pLAD 20, pLCx 20, OM1 50, pRCA stent ok, EF 35-45% with mod sized inf wall and basal segment aneurysm  . Chronic back pain    "down my back, down my legs" (02/18/2017)  . Constipation   . GERD (gastroesophageal reflux disease)   . Hyperlipidemia   . Hypertension    Dr. [name] [phone]  . Ischemic cardiomyopathy    a. LV-gram at time of LHC in 6/17 with EF 35-45%  //  b. Echo 7/17: EF 45-50%, inferior HK, grade 1 diastolic dysfunction, mildly dilated aortic root, moderately reduced RVSF, mild RAE  . Myocardial infarction ([place]) 2017  . 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 Numbness and tingling  of both lower extremities    "on the outside of both sides" (02/18/2017)  . Rheumatoid arthritis ([place])   . Type II diabetes mellitus (HCC)    diet controlled   There were no vitals taken for this visit.  Opioid Risk Score:   Fall Risk Score:  `1  Depression screen PHQ 2/9  Depression screen [facility] 2/9 02/11/2018 12/07/2017 10/15/2017 01/28/2016 11/12/2015  Decreased Interest 0 0 1 0 0  Down, Depressed, Hopeless 0 0 0 0 0  PHQ -  2 Score 0 0 1 0 0  Altered sleeping - - 1 - -  Tired, decreased energy - - 1 - -  Change in appetite - - 0 - -  Feeling bad or failure about yourself  - - 0 - -  Trouble concentrating - - 0 - -  Moving slowly or fidgety/restless - - 0 - -  Suicidal thoughts - - 0 - -  PHQ-9 Score - - 3 - -  Some recent data might be hidden     Review of Systems  Constitutional: Negative.   HENT: Negative.   Eyes: Negative.   Respiratory: Negative.   Cardiovascular: Negative.   Gastrointestinal: Negative.   Endocrine: Negative.   Genitourinary: Negative.   Musculoskeletal: Positive for arthralgias, back pain and myalgias.  Skin: Negative.   Allergic/Immunologic: Negative.   Neurological: Positive for weakness and numbness.  Hematological: Negative.   Psychiatric/Behavioral: Negative.   All other systems reviewed and are negative.      Objective:   Physical Exam Vitals signs and nursing note reviewed.  Musculoskeletal:     Comments: No Physical Exam Performed: Virtual Visit  Neurological:     Mental Status: He is oriented to person, place, and time.           Assessment & Plan:  1. Lumbar Post-Laminectomy Syndrome/ Chronic Post-operative pain.: Continue HEP as Tolerated. Continue to Monitor. 07/06/2018. 2. Lumbar Radiculitis: Continue Lyrica. Continue to monitor.07/06/2018 3. Bilateral Hip Pain:Continue HEP as Tolerated . Continue to Monitor.07/06/2018 4. Chronic Pain Syndrome: Refilled: Xtampza ER36Mg  one tablet every 12 hours #60.  We will  continue the opioid monitoring program, this consists of regular clinic visits, examinations, urine drug screen, pill counts as well as use of [place] Controlled Substance Reporting system.  F/U in 1 month  Telephone Call  Location of patient: In his Home  Location of provider: Office Established patient Time spent on call: 10 minutes

## 2018-08-02 ENCOUNTER — Encounter: Payer: Medicare Other | Attending: Physical Medicine & Rehabilitation | Admitting: Registered Nurse

## 2018-08-02 ENCOUNTER — Encounter: Payer: Self-pay | Admitting: Registered Nurse

## 2018-08-02 ENCOUNTER — Other Ambulatory Visit: Payer: Self-pay

## 2018-08-02 VITALS — BP 164/95 | HR 93 | Temp 97.8°F | Ht 70.0 in | Wt 263.0 lb

## 2018-08-02 DIAGNOSIS — Z79891 Long term (current) use of opiate analgesic: Secondary | ICD-10-CM | POA: Insufficient documentation

## 2018-08-02 DIAGNOSIS — M069 Rheumatoid arthritis, unspecified: Secondary | ICD-10-CM | POA: Insufficient documentation

## 2018-08-02 DIAGNOSIS — M25562 Pain in left knee: Secondary | ICD-10-CM

## 2018-08-02 DIAGNOSIS — I10 Essential (primary) hypertension: Secondary | ICD-10-CM | POA: Insufficient documentation

## 2018-08-02 DIAGNOSIS — I2510 Atherosclerotic heart disease of native coronary artery without angina pectoris: Secondary | ICD-10-CM | POA: Insufficient documentation

## 2018-08-02 DIAGNOSIS — Z955 Presence of coronary angioplasty implant and graft: Secondary | ICD-10-CM | POA: Insufficient documentation

## 2018-08-02 DIAGNOSIS — Z5181 Encounter for therapeutic drug level monitoring: Secondary | ICD-10-CM

## 2018-08-02 DIAGNOSIS — M25551 Pain in right hip: Secondary | ICD-10-CM | POA: Diagnosis not present

## 2018-08-02 DIAGNOSIS — Z8249 Family history of ischemic heart disease and other diseases of the circulatory system: Secondary | ICD-10-CM | POA: Insufficient documentation

## 2018-08-02 DIAGNOSIS — I255 Ischemic cardiomyopathy: Secondary | ICD-10-CM | POA: Insufficient documentation

## 2018-08-02 DIAGNOSIS — M961 Postlaminectomy syndrome, not elsewhere classified: Secondary | ICD-10-CM | POA: Insufficient documentation

## 2018-08-02 DIAGNOSIS — Z96641 Presence of right artificial hip joint: Secondary | ICD-10-CM | POA: Insufficient documentation

## 2018-08-02 DIAGNOSIS — Z9689 Presence of other specified functional implants: Secondary | ICD-10-CM | POA: Insufficient documentation

## 2018-08-02 DIAGNOSIS — Z87891 Personal history of nicotine dependence: Secondary | ICD-10-CM | POA: Insufficient documentation

## 2018-08-02 DIAGNOSIS — Z8042 Family history of malignant neoplasm of prostate: Secondary | ICD-10-CM | POA: Insufficient documentation

## 2018-08-02 DIAGNOSIS — M5416 Radiculopathy, lumbar region: Secondary | ICD-10-CM | POA: Diagnosis not present

## 2018-08-02 DIAGNOSIS — G8929 Other chronic pain: Secondary | ICD-10-CM

## 2018-08-02 DIAGNOSIS — I252 Old myocardial infarction: Secondary | ICD-10-CM | POA: Insufficient documentation

## 2018-08-02 DIAGNOSIS — M25561 Pain in right knee: Secondary | ICD-10-CM | POA: Diagnosis not present

## 2018-08-02 DIAGNOSIS — G8928 Other chronic postprocedural pain: Secondary | ICD-10-CM | POA: Insufficient documentation

## 2018-08-02 DIAGNOSIS — Z981 Arthrodesis status: Secondary | ICD-10-CM | POA: Insufficient documentation

## 2018-08-02 DIAGNOSIS — E119 Type 2 diabetes mellitus without complications: Secondary | ICD-10-CM | POA: Insufficient documentation

## 2018-08-02 DIAGNOSIS — Z79899 Other long term (current) drug therapy: Secondary | ICD-10-CM | POA: Insufficient documentation

## 2018-08-02 DIAGNOSIS — E785 Hyperlipidemia, unspecified: Secondary | ICD-10-CM | POA: Insufficient documentation

## 2018-08-02 DIAGNOSIS — K219 Gastro-esophageal reflux disease without esophagitis: Secondary | ICD-10-CM | POA: Insufficient documentation

## 2018-08-02 DIAGNOSIS — G894 Chronic pain syndrome: Secondary | ICD-10-CM

## 2018-08-02 DIAGNOSIS — M545 Low back pain: Secondary | ICD-10-CM | POA: Insufficient documentation

## 2018-08-02 MED ORDER — OXYCODONE ER 36 MG PO C12A: 1.0000 | EXTENDED_RELEASE_CAPSULE | Freq: Two times a day (BID) | ORAL | 0 refills | Status: DC

## 2018-08-02 MED ORDER — PREGABALIN 200 MG PO CAPS: 200.0000 mg | ORAL_CAPSULE | Freq: Two times a day (BID) | ORAL | 3 refills | Status: DC

## 2018-08-02 NOTE — Progress Notes (Signed)
Subjective:    Patient ID: Thomas Mcclure, male    DOB: 1955/11/28, 63 y.o.   MRN: [id number]  HPI: Thomas Mcclure is a 63 y.o. male his appointment was changed, due to national recommendations of social distancing due to [place] 19, an audio/video telehealth visit is felt to be most appropriate for this patient at this time.  See Chart message from today for the patient's consent to telehealth from [place].     He states his pain is located in his lower back radiating into his bilateral lower extremities and right hip pain. Thomas Mcclure reports increase intensity of back and right hip pain with activity, encouraged to continue with HEP as tolerated and alternate with Ice and Heat. Also encouraged to use moderation with activities, he verbalizes understanding. He rates his pain 8. His current exercise regime is walking and light yard work.   Thomas Mcclure Morphine equivalent is 108.00 MME.  Last UDS was performed on 03/09/2018, it was consistent.   Geryl Rankins CMA asked the Health and History Questions. This provider and Mancel Parsons verified we were  speaking with the correct person using two identifiers.   Pain Inventory Average Pain 7 Pain Right Now 8 My pain is constant, sharp, burning, tingling and aching  In the last 24 hours, has pain interfered with the following? General activity 8 Relation with others 5 Enjoyment of life 7 What TIME of day is your pain at its worst? evening Sleep (in general) Good  Pain is worse with: walking, standing and some activites Pain improves with: rest and medication Relief from Meds: 6  Mobility walk with assistance use a cane how many minutes can you walk? 15 ability to climb steps?  yes do you drive?  yes  Function disabled: date disabled .  Neuro/Psych weakness numbness tingling trouble walking spasms  Prior Studies Any changes since last visit?  no  Physicians involved in your care Any  changes since last visit?  no   Family History  Problem Relation Age of Onset  . Heart disease Mother   . Prostate cancer Brother    Social History   Socioeconomic History  . Marital status: Married    Spouse name: Not on file  . Number of children: 5  . Years of education: Not on file  . Highest education level: Not on file  Occupational History  . Occupation: Development worker, community, now on disability due to back pain  Social Needs  . Financial resource strain: Not on file  . Food insecurity:    Worry: Not on file    Inability: Not on file  . Transportation needs:    Medical: Not on file    Non-medical: Not on file  Tobacco Use  . Smoking status: Former Smoker    Packs/day: 0.25    Years: 40.00    Pack years: 10.00    Types: Cigarettes  . Smokeless tobacco: Never Used  . Tobacco comment: 02/18/2017 "quit in 2012"  Substance and Sexual Activity  . Alcohol use: No    Alcohol/week: 0.0 standard drinks  . Drug use: No  . Sexual activity: Not Currently  Lifestyle  . Physical activity:    Days per week: Not on file    Minutes per session: Not on file  . Stress: Not on file  Relationships  . Social connections:    Talks on phone: Not on file    Gets together: Not on file  Attends religious service: Not on file    Active member of club or organization: Not on file    Attends meetings of clubs or organizations: Not on file    Relationship status: Not on file  Other Topics Concern  . Not on file  Social History Narrative  . Not on file   Past Surgical History:  Procedure Laterality Date  . BACK SURGERY    . CARDIAC CATHETERIZATION N/A 07/15/2015   Procedure: Left Heart Cath and Coronary Angiography;  Surgeon: [name], MD;  Location: [facility];  Service: Cardiovascular;  Laterality: N/A;  . CARDIAC CATHETERIZATION N/A 07/15/2015   Procedure: Coronary Stent Intervention;  Surgeon: [name], MD;  Location: [facility];  Service:  Cardiovascular;  Laterality: N/A;  . CARDIAC CATHETERIZATION N/A 08/07/2015   Procedure: Left Heart Cath and Coronary Angiography;  Surgeon: [name], MD;  Location: [facility];  Service: Cardiovascular;  Laterality: N/A;  . CORONARY ANGIOPLASTY WITH STENT PLACEMENT  07/2015  . JOINT REPLACEMENT    . LEFT HEART CATH AND CORONARY ANGIOGRAPHY N/A 02/19/2017   Procedure: LEFT HEART CATH AND CORONARY ANGIOGRAPHY;  Surgeon: Martinique, Peter M, MD;  Location: [facility];  Service: Cardiovascular;  Laterality: N/A;  . LUMBAR SPINE SURGERY  03/2009  & 2012  . MASS EXCISION N/A 04/19/2012   Procedure: removal of posterior cervical lipoma;  Surgeon: [name], MD;  Location: [facility];  Service: Neurosurgery;  Laterality: N/A;  Removal of posterior cervical lipoma  . POPLITEAL SYNOVIAL CYST EXCISION Left    "opened up behind my knee; scraped out arthritis"  . SPINAL CORD STIMULATOR INSERTION N/A 01/07/2013   Procedure:  SPINAL CORD STIMULATOR INSERTION;  Surgeon: [name], MD;  Location: [facility];  Service: Neurosurgery;  Laterality: N/A;  . TONSILLECTOMY    . TOTAL HIP ARTHROPLASTY Right 10/03/2016   Procedure: TOTAL HIP ARTHROPLASTY;  Surgeon: [name], MD;  Location: [facility];  Service: Orthopedics;  Laterality: Right;  . WISDOM TOOTH EXTRACTION     hx of   Past Medical History:  Diagnosis Date  . Baker's cyst    Left calf  . CAD in native artery, 07/15/15 PCI of RCA with DES 07/16/2015   a.   NSTEMI 5/17: LHC - pLAD 20, pLCx 20, OM1 30, pRCA 100, EF 45-50%>> PCI: 2.5 x 24 mm Promus DES to RCA  //  b.   Echo 5/17: mild LVH, EF 50-55%, no RWMA, mod RVE //  c. LHC 6/17: pLAD 20, pLCx 20, OM1 50, pRCA stent ok, EF 35-45% with mod sized inf wall and basal segment aneurysm  . Chronic back pain    "down my back, down my legs" (02/18/2017)  . Constipation   . GERD (gastroesophageal reflux disease)   . Hyperlipidemia   . Hypertension    Dr. [name]  [phone]  . Ischemic cardiomyopathy    a. LV-gram at time of LHC in 6/17 with EF 35-45%  //  b. Echo 7/17: EF 45-50%, inferior HK, grade 1 diastolic dysfunction, mildly dilated aortic root, moderately reduced RVSF, mild RAE  . Myocardial infarction ([place]) 2017  . 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 Numbness and tingling of both lower extremities    "on the outside of both sides" (02/18/2017)  . Rheumatoid arthritis ([place])   . Type II diabetes mellitus (HCC)    diet controlled   BP Marland Kitchen)  164/95   Pulse 93   Temp 97.8 F (36.6 C)   Ht 5\' 10"  (1.778 m)   Wt 263 lb (119.3 kg)   BMI 37.74 kg/m   Opioid Risk Score:   Fall Risk Score:  `1  Depression screen PHQ 2/9  Depression screen [facility] 2/9 02/11/2018 12/07/2017 10/15/2017 01/28/2016 11/12/2015  Decreased Interest 0 0 1 0 0  Down, Depressed, Hopeless 0 0 0 0 0  PHQ - 2 Score 0 0 1 0 0  Altered sleeping - - 1 - -  Tired, decreased energy - - 1 - -  Change in appetite - - 0 - -  Feeling bad or failure about yourself  - - 0 - -  Trouble concentrating - - 0 - -  Moving slowly or fidgety/restless - - 0 - -  Suicidal thoughts - - 0 - -  PHQ-9 Score - - 3 - -  Some recent data might be hidden    Review of Systems  Constitutional: Negative.   HENT: Negative.   Eyes: Negative.   Respiratory: Negative.   Cardiovascular: Negative.   Gastrointestinal: Negative.   Endocrine: Negative.   Genitourinary: Negative.   Musculoskeletal: Positive for arthralgias, back pain, gait problem and myalgias.       Spasms   Skin: Negative.   Allergic/Immunologic: Negative.   Neurological: Positive for weakness and numbness.       Tingling  Hematological: Negative.   Psychiatric/Behavioral: Negative.        Objective:   Physical Exam Vitals signs and nursing note reviewed.  Musculoskeletal:     Comments: No Physical Exam Performed: Virtual Visit  Neurological:     Mental Status: He is oriented to person, place, and  time.           Assessment & Plan:  1. Lumbar Post-Laminectomy Syndrome/ Chronic Post-operative pain.: Continue HEP as Tolerated. Continue to Monitor. 08/02/2018. 2. Lumbar Radiculitis: Continue Lyrica. Continue to monitor.08/02/2018 3. Right Hip Pain:Has an appointment with Orthopedist , he reports. Continue HEP as Tolerated . Continue to Monitor.08/02/2018 4. Chronic Pain Syndrome: Refilled: Xtampza ER36Mg  one tablet every 12 hours #60.  We will continue the opioid monitoring program, this consists of regular clinic visits, examinations, urine drug screen, pill counts as well as use of [place] Controlled Substance Reporting system.  F/U in 1 month   Telephone Call  Location of patient: In his Home Location of provider: Office Established patient Time spent on call:15 minutes

## 2018-08-13 ENCOUNTER — Telehealth: Payer: Self-pay

## 2018-08-13 NOTE — Telephone Encounter (Signed)
Error

## 2018-08-13 NOTE — Telephone Encounter (Deleted)
Patient has called requesting a call back in reguards to a question.

## 2018-09-01 ENCOUNTER — Other Ambulatory Visit: Payer: Self-pay

## 2018-09-01 ENCOUNTER — Encounter: Payer: Self-pay | Admitting: Registered Nurse

## 2018-09-01 ENCOUNTER — Encounter: Payer: Medicare Other | Attending: Physical Medicine & Rehabilitation | Admitting: Registered Nurse

## 2018-09-01 VITALS — BP 146/96 | HR 103 | Temp 97.6°F | Resp 12 | Wt 267.0 lb

## 2018-09-01 DIAGNOSIS — Z8249 Family history of ischemic heart disease and other diseases of the circulatory system: Secondary | ICD-10-CM | POA: Diagnosis not present

## 2018-09-01 DIAGNOSIS — M961 Postlaminectomy syndrome, not elsewhere classified: Secondary | ICD-10-CM | POA: Diagnosis not present

## 2018-09-01 DIAGNOSIS — Z79899 Other long term (current) drug therapy: Secondary | ICD-10-CM | POA: Diagnosis not present

## 2018-09-01 DIAGNOSIS — Z8042 Family history of malignant neoplasm of prostate: Secondary | ICD-10-CM | POA: Diagnosis not present

## 2018-09-01 DIAGNOSIS — I2510 Atherosclerotic heart disease of native coronary artery without angina pectoris: Secondary | ICD-10-CM | POA: Diagnosis not present

## 2018-09-01 DIAGNOSIS — M069 Rheumatoid arthritis, unspecified: Secondary | ICD-10-CM | POA: Insufficient documentation

## 2018-09-01 DIAGNOSIS — E785 Hyperlipidemia, unspecified: Secondary | ICD-10-CM | POA: Diagnosis not present

## 2018-09-01 DIAGNOSIS — E119 Type 2 diabetes mellitus without complications: Secondary | ICD-10-CM | POA: Insufficient documentation

## 2018-09-01 DIAGNOSIS — Z87891 Personal history of nicotine dependence: Secondary | ICD-10-CM | POA: Insufficient documentation

## 2018-09-01 DIAGNOSIS — G8928 Other chronic postprocedural pain: Secondary | ICD-10-CM | POA: Insufficient documentation

## 2018-09-01 DIAGNOSIS — M545 Low back pain: Secondary | ICD-10-CM | POA: Insufficient documentation

## 2018-09-01 DIAGNOSIS — G8929 Other chronic pain: Secondary | ICD-10-CM | POA: Diagnosis present

## 2018-09-01 DIAGNOSIS — G894 Chronic pain syndrome: Secondary | ICD-10-CM

## 2018-09-01 DIAGNOSIS — Z981 Arthrodesis status: Secondary | ICD-10-CM | POA: Diagnosis not present

## 2018-09-01 DIAGNOSIS — I255 Ischemic cardiomyopathy: Secondary | ICD-10-CM | POA: Insufficient documentation

## 2018-09-01 DIAGNOSIS — Z955 Presence of coronary angioplasty implant and graft: Secondary | ICD-10-CM | POA: Diagnosis not present

## 2018-09-01 DIAGNOSIS — Z9689 Presence of other specified functional implants: Secondary | ICD-10-CM | POA: Insufficient documentation

## 2018-09-01 DIAGNOSIS — M5416 Radiculopathy, lumbar region: Secondary | ICD-10-CM | POA: Diagnosis not present

## 2018-09-01 DIAGNOSIS — Z96641 Presence of right artificial hip joint: Secondary | ICD-10-CM | POA: Insufficient documentation

## 2018-09-01 DIAGNOSIS — I252 Old myocardial infarction: Secondary | ICD-10-CM | POA: Diagnosis not present

## 2018-09-01 DIAGNOSIS — I10 Essential (primary) hypertension: Secondary | ICD-10-CM | POA: Diagnosis not present

## 2018-09-01 DIAGNOSIS — Z5181 Encounter for therapeutic drug level monitoring: Secondary | ICD-10-CM | POA: Diagnosis not present

## 2018-09-01 DIAGNOSIS — Z79891 Long term (current) use of opiate analgesic: Secondary | ICD-10-CM | POA: Diagnosis not present

## 2018-09-01 DIAGNOSIS — K219 Gastro-esophageal reflux disease without esophagitis: Secondary | ICD-10-CM | POA: Insufficient documentation

## 2018-09-01 MED ORDER — XTAMPZA ER 36 MG PO C12A: 1.0000 | EXTENDED_RELEASE_CAPSULE | Freq: Two times a day (BID) | ORAL | 0 refills | Status: DC

## 2018-09-01 NOTE — Progress Notes (Signed)
Subjective:    Patient ID: Thomas Mcclure, male    DOB: 1955/05/14, 63 y.o.   MRN: [id number]  HPI: Thomas Mcclure is a 63 y.o. male who returns for follow up appointment for chronic pain and medication refill. He states his pain is located in his lower back radiating into his bilateral lower extremities and bilateral knee pain. He rates his pain 4. His current exercise regime is walking.  Thomas Mcclure Morphine equivalent is 108.00 MME.  Last UDS was Performed on 03/09/2018, it was consistent.   Pain Inventory Average Pain 8 Pain Right Now 4 My pain is sharp, burning, stabbing, tingling and aching  In the last 24 hours, has pain interfered with the following? General activity 10 Relation with others 4 Enjoyment of life 2 What TIME of day is your pain at its worst? n/a Sleep (in general) n/a  Pain is worse with: walking, sitting, standing and some activites Pain improves with: rest, heat/ice and medication Relief from Meds: 8  Mobility use a cane use a walker ability to climb steps?  yes do you drive?  yes  Function I need assistance with the following:  household duties and shopping Do you have any goals in this area?  yes  Neuro/Psych tingling trouble walking spasms  Prior Studies n/a  Physicians involved in your care n/a   Family History  Problem Relation Age of Onset  . Heart disease Mother   . Prostate cancer Brother    Social History   Socioeconomic History  . Marital status: Married    Spouse name: Not on file  . Number of children: 5  . Years of education: Not on file  . Highest education level: Not on file  Occupational History  . Occupation: Development worker, community, now on disability due to back pain  Social Needs  . Financial resource strain: Not on file  . Food insecurity    Worry: Not on file    Inability: Not on file  . Transportation needs    Medical: Not on file    Non-medical: Not on file  Tobacco Use  . Smoking status: Former  Smoker    Packs/day: 0.25    Years: 40.00    Pack years: 10.00    Types: Cigarettes  . Smokeless tobacco: Never Used  . Tobacco comment: 02/18/2017 "quit in 2012"  Substance and Sexual Activity  . Alcohol use: No    Alcohol/week: 0.0 standard drinks  . Drug use: No  . Sexual activity: Not Currently  Lifestyle  . Physical activity    Days per week: Not on file    Minutes per session: Not on file  . Stress: Not on file  Relationships  . Social Herbalist on phone: Not on file    Gets together: Not on file    Attends religious service: Not on file    Active member of club or organization: Not on file    Attends meetings of clubs or organizations: Not on file    Relationship status: Not on file  Other Topics Concern  . Not on file  Social History Narrative  . Not on file   Past Surgical History:  Procedure Laterality Date  . BACK SURGERY    . CARDIAC CATHETERIZATION N/A 07/15/2015   Procedure: Left Heart Cath and Coronary Angiography;  Surgeon: [name], MD;  Location: [facility];  Service: Cardiovascular;  Laterality: N/A;  . CARDIAC CATHETERIZATION N/A 07/15/2015  Procedure: Coronary Stent Intervention;  Surgeon: [name], MD;  Location: [facility];  Service: Cardiovascular;  Laterality: N/A;  . CARDIAC CATHETERIZATION N/A 08/07/2015   Procedure: Left Heart Cath and Coronary Angiography;  Surgeon: [name], MD;  Location: [facility];  Service: Cardiovascular;  Laterality: N/A;  . CORONARY ANGIOPLASTY WITH STENT PLACEMENT  07/2015  . JOINT REPLACEMENT    . LEFT HEART CATH AND CORONARY ANGIOGRAPHY N/A 02/19/2017   Procedure: LEFT HEART CATH AND CORONARY ANGIOGRAPHY;  Surgeon: Martinique, Peter M, MD;  Location: [facility];  Service: Cardiovascular;  Laterality: N/A;  . LUMBAR SPINE SURGERY  03/2009  & 2012  . MASS EXCISION N/A 04/19/2012   Procedure: removal of posterior cervical lipoma;  Surgeon: [name], MD;   Location: [facility];  Service: Neurosurgery;  Laterality: N/A;  Removal of posterior cervical lipoma  . POPLITEAL SYNOVIAL CYST EXCISION Left    "opened up behind my knee; scraped out arthritis"  . SPINAL CORD STIMULATOR INSERTION N/A 01/07/2013   Procedure:  SPINAL CORD STIMULATOR INSERTION;  Surgeon: [name], MD;  Location: [facility];  Service: Neurosurgery;  Laterality: N/A;  . TONSILLECTOMY    . TOTAL HIP ARTHROPLASTY Right 10/03/2016   Procedure: TOTAL HIP ARTHROPLASTY;  Surgeon: [name], MD;  Location: [facility];  Service: Orthopedics;  Laterality: Right;  . WISDOM TOOTH EXTRACTION     hx of   Past Medical History:  Diagnosis Date  . Baker's cyst    Left calf  . CAD in native artery, 07/15/15 PCI of RCA with DES 07/16/2015   a.   NSTEMI 5/17: LHC - pLAD 20, pLCx 20, OM1 30, pRCA 100, EF 45-50%>> PCI: 2.5 x 24 mm Promus DES to RCA  //  b.   Echo 5/17: mild LVH, EF 50-55%, no RWMA, mod RVE //  c. LHC 6/17: pLAD 20, pLCx 20, OM1 50, pRCA stent ok, EF 35-45% with mod sized inf wall and basal segment aneurysm  . Chronic back pain    "down my back, down my legs" (02/18/2017)  . Constipation   . GERD (gastroesophageal reflux disease)   . Hyperlipidemia   . Hypertension    Dr. [name] [phone]  . Ischemic cardiomyopathy    a. LV-gram at time of LHC in 6/17 with EF 35-45%  //  b. Echo 7/17: EF 45-50%, inferior HK, grade 1 diastolic dysfunction, mildly dilated aortic root, moderately reduced RVSF, mild RAE  . Myocardial infarction ([place]) 2017  . 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 Numbness and tingling of both lower extremities    "on the outside of both sides" (02/18/2017)  . Rheumatoid arthritis ([place])   . Type II diabetes mellitus ([facility])    diet controlled   There were no vitals taken for this visit.  Opioid Risk Score:   Fall Risk Score:  `1  Depression screen PHQ 2/9  Depression screen [facility] 2/9 02/11/2018 12/07/2017 10/15/2017 01/28/2016  11/12/2015  Decreased Interest 0 0 1 0 0  Down, Depressed, Hopeless 0 0 0 0 0  PHQ - 2 Score 0 0 1 0 0  Altered sleeping - - 1 - -  Tired, decreased energy - - 1 - -  Change in appetite - - 0 - -  Feeling bad or failure about yourself  - - 0 - -  Trouble concentrating - - 0 - -  Moving slowly or fidgety/restless - -  0 - -  Suicidal thoughts - - 0 - -  PHQ-9 Score - - 3 - -  Some recent data might be hidden     Review of Systems  Constitutional:       High blood pressure  All other systems reviewed and are negative.      Objective:   Physical Exam Vitals signs and nursing note reviewed.  Constitutional:      Appearance: Normal appearance.  Neck:     Musculoskeletal: Normal range of motion and neck supple.  Cardiovascular:     Rate and Rhythm: Normal rate and regular rhythm.     Pulses: Normal pulses.     Heart sounds: Normal heart sounds.  Pulmonary:     Effort: Pulmonary effort is normal.     Breath sounds: Normal breath sounds.  Musculoskeletal:     Comments: Normal Muscle Bulk and Muscle Testing Reveals:  Upper Extremities: Full ROM and Muscle Strength 5/5 Lumbar Paraspinal Tenderness: L-4-L-5 Lower Extremities: Full ROM and Muscle Strength 5/5 Arises from Table Slowly using cane for support. Narrow Based  Gait   Skin:    General: Skin is warm and dry.  Neurological:     Mental Status: He is alert and oriented to person, place, and time.  Psychiatric:        Mood and Affect: Mood normal.        Behavior: Behavior normal.           Assessment & Plan:  1. Lumbar Post-Laminectomy Syndrome/ Chronic Post-operative pain.: Continue HEP as Tolerated. Continue to Monitor. 09/01/2018. 2. Lumbar Radiculitis: Continue Lyrica. Continue to monitor.09/01/2018 3. Right Hip Pain:No Complaints Today.  Continue HEP as Tolerated . Continue to Monitor.09/01/2018 4. Chronic Pain Syndrome: Refilled: Xtampza ER36Mg  one tablet every 12 hours #60. 09/01/2018 We will  continue the opioid monitoring program, this consists of regular clinic visits, examinations, urine drug screen, pill counts as well as use of [place] Controlled Substance Reporting system.  15 minutes of face to face patient care time was spent during this visit. All questions were encouraged and answered.  F/U in 1 month

## 2018-09-04 LAB — TOXASSURE SELECT,+ANTIDEPR,UR

## 2018-09-08 ENCOUNTER — Telehealth: Payer: Self-pay | Admitting: *Deleted

## 2018-09-08 NOTE — Telephone Encounter (Signed)
Urine drug screen for this encounter is inconsistent. His prescribed oxycodone is present with metabolites but he also has hydrocodone and its metabolites which has not been prescribed for him in the past 2 years according to PMP. Please advise.

## 2018-09-09 ENCOUNTER — Telehealth: Payer: Self-pay | Admitting: Registered Nurse

## 2018-09-09 NOTE — Telephone Encounter (Signed)
Placed a call to Thomas Mcclure regarding his UDS , he is adamant he only takes his [place]. This will be discussed with Dr. Letta Pate, he is awre this can lead to discharge from our office. He verbalizes understanding.

## 2018-09-09 NOTE — Telephone Encounter (Signed)
Placed a call to Thomas Mcclure regarding UDS results, no answer. Left message to return the call.

## 2018-09-13 ENCOUNTER — Telehealth: Payer: Self-pay | Admitting: Registered Nurse

## 2018-09-13 NOTE — Telephone Encounter (Signed)
This provider spoke with Dr. Letta Pate regarding Thomas Mcclure UDS. Thomas Mcclure will be discharged from our office. He will be prescribed Xtampza 18 mg for 2 weeks and 9 mg for 2 weeks . This provider placed a call to Thomas Mcclure regarding the above, he wanted to repeat the UDS, reviewed the narcotic policy, he verbalizes understanding.

## 2018-09-15 NOTE — Telephone Encounter (Signed)
Discharge letter mailed with copy of surrounding pain clinic list.

## 2018-09-28 ENCOUNTER — Telehealth: Payer: Self-pay | Admitting: Registered Nurse

## 2018-09-28 MED ORDER — XTAMPZA ER 9 MG PO C12A: 1.0000 | EXTENDED_RELEASE_CAPSULE | Freq: Two times a day (BID) | ORAL | 0 refills | Status: DC

## 2018-09-28 MED ORDER — XTAMPZA ER 18 MG PO C12A: 1.0000 | EXTENDED_RELEASE_CAPSULE | Freq: Two times a day (BID) | ORAL | 0 refills | Status: DC

## 2018-09-28 NOTE — Telephone Encounter (Signed)
PMP was reviewed:  Weaning Prescriptions were e-scribed. See Note from 09/13/2018.  Last Prescription: Mr. Thomas Mcclure is discharged from our office and he is aware.

## 2018-09-29 ENCOUNTER — Ambulatory Visit: Payer: Medicare Other | Admitting: Registered Nurse

## 2018-10-04 NOTE — Progress Notes (Signed)
CARDIOLOGY OFFICE NOTE  Date:  10/06/2018    Thomas Mcclure Date of Birth: 11-27-1955 Medical Record #[id number]  PCP:  Vassie Moment, MD  Cardiologist:  [name]    Chief Complaint  Patient presents with   Follow-up    Seen for [name]    History of Present Illness: Thomas Mcclure is a 63 y.o. male who presents today for a follow up visit. Seen for [name].   He has a history of CAD with prior NSTEMI in 2017 with DES to the RCA - complicated by LV aneurysm. Other issues include chronic combined systolic/diastolic HF, DM, HTN, HLD and chronic back pain with spinal stimulator in place. Last cath in 2018 demonstrated patent stent.   Last seen by Cecilie Kicks, NP in March - doing well. Saw [name] last September.   The patient does not have symptoms concerning for COVID-19 infection (fever, chills, cough, or new shortness of breath).   Comes in today. Here with his wife. Remains limited by his back.  Rather talkative. Notes that this is really a work in visit at the request of his PCP.  He cannot walk as far now. He says he could walk 5 or 6 blocks back in the winter - but now can't do even a block or so. He has considerable leg pain - worse with walking - but has some with rest as well. Seems about the same in both legs.  He has just had his medicines about an hour prior to coming. HR remains elevated.  Also endorses some chest pain - seems vague on description - says it just comes and goes. This may be exertional - wife did not think so - he has not used NTG. He continues to be followed in pain management.   Past Medical History:  Diagnosis Date   Baker's cyst    Left calf   CAD in native artery, 07/15/15 PCI of RCA with DES 07/16/2015   a.   NSTEMI 5/17: LHC - pLAD 20, pLCx 20, OM1 30, pRCA 100, EF 45-50%>> PCI: 2.5 x 24 mm Promus DES to RCA  //  b.   Echo 5/17: mild LVH, EF 50-55%, no RWMA, mod RVE //  c. LHC 6/17: pLAD 20, pLCx 20, OM1 50, pRCA stent ok, EF  35-45% with mod sized inf wall and basal segment aneurysm   Chronic back pain    "down my back, down my legs" (02/18/2017)   Constipation    GERD (gastroesophageal reflux disease)    Hyperlipidemia    Hypertension    Dr. [name] [phone]   Ischemic cardiomyopathy    a. LV-gram at time of LHC in 6/17 with EF 35-45%  //  b. 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"   Numbness and tingling of both lower extremities    "on the outside of both sides" (02/18/2017)   Rheumatoid arthritis ([place])    Type II diabetes mellitus ([place])    diet controlled    Past Surgical History:  Procedure Laterality Date   BACK SURGERY     CARDIAC CATHETERIZATION N/A 07/15/2015   Procedure: Left Heart Cath and Coronary Angiography;  Surgeon: [name], MD;  Location: [facility];  Service: Cardiovascular;  Laterality: N/A;   CARDIAC CATHETERIZATION  N/A 07/15/2015   Procedure: Coronary Stent Intervention;  Surgeon: [name], MD;  Location: [facility];  Service: Cardiovascular;  Laterality: N/A;   CARDIAC CATHETERIZATION N/A 08/07/2015   Procedure: Left Heart Cath and Coronary Angiography;  Surgeon: [name], MD;  Location: [facility];  Service: Cardiovascular;  Laterality: N/A;   CORONARY ANGIOPLASTY WITH STENT PLACEMENT  07/2015   JOINT REPLACEMENT     LEFT HEART CATH AND CORONARY ANGIOGRAPHY N/A 02/19/2017   Procedure: LEFT HEART CATH AND CORONARY ANGIOGRAPHY;  Surgeon: Martinique, Peter M, MD;  Location: [facility];  Service: Cardiovascular;  Laterality: N/A;   LUMBAR SPINE SURGERY  03/2009  & 2012   MASS EXCISION N/A 04/19/2012   Procedure: removal of posterior cervical lipoma;  Surgeon: [name], MD;  Location: [facility];  Service: Neurosurgery;  Laterality:  N/A;  Removal of posterior cervical lipoma   POPLITEAL SYNOVIAL CYST EXCISION Left    "opened up behind my knee; scraped out arthritis"   SPINAL CORD STIMULATOR INSERTION N/A 01/07/2013   Procedure:  SPINAL CORD STIMULATOR INSERTION;  Surgeon: [name], MD;  Location: [facility];  Service: Neurosurgery;  Laterality: N/A;   TONSILLECTOMY     TOTAL HIP ARTHROPLASTY Right 10/03/2016   Procedure: TOTAL HIP ARTHROPLASTY;  Surgeon: [name], MD;  Location: [facility];  Service: Orthopedics;  Laterality: Right;   WISDOM TOOTH EXTRACTION     hx of     Medications: Current Meds  Medication Sig   aspirin EC 81 MG tablet Take 1 tablet (81 mg total) by mouth daily.   atorvastatin (LIPITOR) 80 MG tablet Take 80 mg by mouth daily at 6 PM.   chlorthalidone (HYGROTON) 25 MG tablet Take 1 tablet (25 mg total) by mouth daily.   cyclobenzaprine (FLEXERIL) 10 MG tablet TAKE 1 TABLET(10 MG) BY MOUTH DAILY AS NEEDED FOR MUSCLE SPASMS   DILT-XR 180 MG 24 hr capsule Take 180 mg by mouth once daily   DULoxetine (CYMBALTA) 60 MG capsule Take 1 capsule (60 mg total) by mouth daily.   glipiZIDE (GLUCOTROL) 5 MG tablet Take by mouth daily before breakfast.   lisinopril (PRINIVIL,ZESTRIL) 20 MG tablet Take 20 mg by mouth at bedtime.    lubiprostone (AMITIZA) 24 MCG capsule Take 24 mcg by mouth 2 (two) times daily with a meal.   metFORMIN (GLUCOPHAGE XR) 500 MG 24 hr tablet Take 2 tablets (1,000 mg total) by mouth 2 (two) times daily.   Multiple Vitamin (MULTIVITAMIN WITH MINERALS) TABS tablet Take 1 tablet by mouth daily.   nitroGLYCERIN (NITROSTAT) 0.4 MG SL tablet Place 1 tablet (0.4 mg total) under the tongue every 5 (five) minutes as needed for chest pain.   omeprazole (PRILOSEC) 40 MG capsule Take 1 capsule (40 mg total) by mouth daily.   oxyCODONE ER (XTAMPZA ER) 9 MG C12A Take 1 capsule by mouth every 12 (twelve) hours. Do Not Fill Before 08/18/02020   polyethylene glycol powder  (GLYCOLAX/MIRALAX) powder Take 17 g by mouth daily. Mix in 8 oz water, juice, soda, coffee or tea and drink   potassium chloride SA (K-DUR,KLOR-CON) 20 MEQ tablet Take 1 tablet (20 mEq total) by mouth daily.   pregabalin (LYRICA) 200 MG capsule Take 1 capsule (200 mg total) by mouth 2 (two) times daily.   senna-docusate (SENOKOT S) 8.6-50 MG tablet Take 1 tablet by mouth 2 (two) times daily.     Allergies: Allergies  Allergen Reactions  Brilinta [Ticagrelor] Shortness Of Breath and Other (See Comments)    CHEST PAIN    Methylprednisolone Other (See Comments)    DIAPHORESIS, HYPOTENSION   Prednisone Other (See Comments)    DIAPHORESIS, HYPOTENSION   Toradol [Ketorolac Tromethamine] Other (See Comments)    DIAPHORESIS, HYPOTENSION     Adhesive [Tape] Rash and Other (See Comments)    "blisters" ( NO SURGICAL TAPE, PLEASE)    Social History: The patient  reports that he has quit smoking. His smoking use included cigarettes. He has a 10.00 pack-year smoking history. He has never used smokeless tobacco. He reports that he does not drink alcohol or use drugs.   Family History: The patient's family history includes Heart disease in his mother; Prostate cancer in his brother.   Review of Systems: Please see the history of present illness.   All other systems are reviewed and negative.   Physical Exam: VS:  BP 130/80    Pulse (!) 116    Ht 5\' 10"  (1.778 m)    Wt 265 lb (120.2 kg)    SpO2 93%    BMI 38.02 kg/m  .  BMI Body mass index is 38.02 kg/m.  Wt Readings from Last 3 Encounters:  10/06/18 265 lb (120.2 kg)  09/01/18 267 lb (121.1 kg)  08/02/18 263 lb (119.3 kg)    General: Pleasant. Alert and in no acute distress.  Obese. Using a cane.  HEENT: Normal.  Neck: Supple, no JVD, carotid bruits, or masses noted.  Cardiac: Regular rate and rhythm. HR little fast on exam. No edema. Very little hair on his legs. Legs are warm. Decreased pulses noted.   Respiratory:  Lungs  are clear to auscultation bilaterally with normal work of breathing.  GI: Soft and nontender.  MS: No deformity or atrophy. Gait and ROM intact.  Skin: Warm and dry. Color is normal.  Neuro:  Strength and sensation are intact and no gross focal deficits noted.  Psych: Alert, appropriate and with normal affect.   LABORATORY DATA:  EKG:  EKG is ordered today. This shows sinus tach with inferior Q's - unchanged other than HR is higher than previously (was 104 on last tracing)  Lab Results  Component Value Date   WBC 13.2 (H) 05/31/2017   HGB 8.9 (L) 05/31/2017   HCT 29.0 (L) 05/31/2017   PLT 257 05/31/2017   GLUCOSE 142 (H) 04/26/2018   CHOL 111 04/26/2018   TRIG 114 04/26/2018   HDL 31 (L) 04/26/2018   LDLCALC 57 04/26/2018   ALT 39 04/26/2018   AST 30 04/26/2018   NA 137 04/26/2018   K 4.2 04/26/2018   CL 99 04/26/2018   CREATININE 1.27 04/26/2018   BUN 19 04/26/2018   CO2 21 04/26/2018   TSH 0.981 04/26/2018   PSA 0.32 11/06/2017   INR 0.96 02/18/2017   HGBA1C 8.7 (H) 11/06/2017   MICROALBUR <0.7 11/06/2017     BNP (last 3 results) No results for input(s): BNP in the last 8760 hours.  ProBNP (last 3 results) Recent Labs    11/04/17 0935  PROBNP 90     Other Studies Reviewed Today:  Cardiac Cath 02/19/17 .  Prox LAD to Mid LAD lesion is 20% stenosed.  Prox Cx to Mid Cx lesion is 20% stenosed.  Ost 1st Mrg to 1st Mrg lesion is 50% stenosed.  Non-stenotic Prox RCA lesion previously treated.  The left ventricular systolic function is normal.  LV end diastolic pressure is normal.  The  left ventricular ejection fraction is 50-55% by visual estimate.  1. Nonobstructive CAD. Patent stent in the RCA.  2. Overall good LV function 3. Normal LVEDP  Plan: continue medical therapy   Echo 12/2015 Study Conclusions  - Left ventricle: The cavity size was normal. There was mild concentric hypertrophy. Systolic function was normal. The estimated  ejection fraction was in the range of 55% to 60%. There is akinesis of the basal-midinferior myocardium. Doppler parameters are consistent with abnormal left ventricular relaxation (grade 1 diastolic dysfunction).   ASSESSMENT AND PLAN:  1.  Bilateral leg pain - will arrange for arterial doppler studies. Has multiple risk factors for PAD but also with lots of back and neuropathy issues.   2. Chest pain - has known CAD with prior MI and PCI - last cath in 2018 - managed medically - will arrange for EKG and Myoview   2. Chronic combines systolic and diastolic HF stable - not really short of breath. Last echo showed normal EF.   3.  DM - per PCP - looks uncontrolled by last A1C noted in Epic - they say his sugar is good.   4. HTN - BP is ok.   5. HLD - on statin - he says his PCP checks this.   6. Tachycardia - I think he would do better if his heart rate was slower - starting Lopressor 25 mg BID today.   7. COVID-19 Education: The signs and symptoms of COVID-19 were discussed with the patient and how to seek care for testing (follow up with PCP or arrange E-visit).  The importance of social distancing, staying at home, hand hygiene and wearing a mask when out in public were discussed today.  Current medicines are reviewed with the patient today.  The patient does not have concerns regarding medicines other than what has been noted above.  The following changes have been made:  See above.  Labs/ tests ordered today include:    Orders Placed This Encounter  Procedures   Basic metabolic panel   CBC   EKG 12-Lead     Disposition:   Further disposition to follow.   Patient is agreeable to this plan and will call if any problems develop in the interim.   SignedTruitt Merle, NP  10/06/2018 9:30 AM  [facility] [address] [place], [place]  [address] Phone: [phone] Fax: [phone]

## 2018-10-06 ENCOUNTER — Telehealth (HOSPITAL_COMMUNITY): Payer: Self-pay | Admitting: *Deleted

## 2018-10-06 ENCOUNTER — Encounter (INDEPENDENT_AMBULATORY_CARE_PROVIDER_SITE_OTHER): Payer: Self-pay

## 2018-10-06 ENCOUNTER — Ambulatory Visit (INDEPENDENT_AMBULATORY_CARE_PROVIDER_SITE_OTHER): Payer: Medicare Other | Admitting: Nurse Practitioner

## 2018-10-06 ENCOUNTER — Encounter: Payer: Self-pay | Admitting: Nurse Practitioner

## 2018-10-06 ENCOUNTER — Other Ambulatory Visit: Payer: Self-pay

## 2018-10-06 VITALS — BP 130/80 | HR 116 | Ht 70.0 in | Wt 265.0 lb

## 2018-10-06 DIAGNOSIS — Z7189 Other specified counseling: Secondary | ICD-10-CM

## 2018-10-06 DIAGNOSIS — M79604 Pain in right leg: Secondary | ICD-10-CM

## 2018-10-06 DIAGNOSIS — I255 Ischemic cardiomyopathy: Secondary | ICD-10-CM

## 2018-10-06 DIAGNOSIS — I10 Essential (primary) hypertension: Secondary | ICD-10-CM

## 2018-10-06 DIAGNOSIS — I2510 Atherosclerotic heart disease of native coronary artery without angina pectoris: Secondary | ICD-10-CM | POA: Diagnosis not present

## 2018-10-06 DIAGNOSIS — M79605 Pain in left leg: Secondary | ICD-10-CM

## 2018-10-06 DIAGNOSIS — R0789 Other chest pain: Secondary | ICD-10-CM

## 2018-10-06 DIAGNOSIS — R079 Chest pain, unspecified: Secondary | ICD-10-CM

## 2018-10-06 LAB — BASIC METABOLIC PANEL
BUN/Creatinine Ratio: 13 (ref 10–24)
BUN: 13 mg/dL (ref 8–27)
CO2: 23 mmol/L (ref 20–29)
Calcium: 10.1 mg/dL (ref 8.6–10.2)
Chloride: 95 mmol/L — ABNORMAL LOW (ref 96–106)
Creatinine, Ser: 1.01 mg/dL (ref 0.76–1.27)
GFR calc Af Amer: 91 mL/min/{1.73_m2} (ref >59)
GFR calc non Af Amer: 79 mL/min/{1.73_m2} (ref >59)
Glucose: 162 mg/dL — ABNORMAL HIGH (ref 65–99)
Potassium: 4.5 mmol/L (ref 3.5–5.2)
Sodium: 136 mmol/L (ref 134–144)

## 2018-10-06 LAB — CBC
Hematocrit: 39.9 % (ref 37.5–51.0)
Hemoglobin: 12.8 g/dL — ABNORMAL LOW (ref 13.0–17.7)
MCH: 25.5 pg — ABNORMAL LOW (ref 26.6–33.0)
MCHC: 32.1 g/dL (ref 31.5–35.7)
MCV: 80 fL (ref 79–97)
Platelets: 368 10*3/uL (ref 150–450)
RBC: 5.01 x10E6/uL (ref 4.14–5.80)
RDW: 14.3 % (ref 11.6–15.4)
WBC: 8.7 10*3/uL (ref 3.4–10.8)

## 2018-10-06 MED ORDER — METOPROLOL TARTRATE 25 MG PO TABS: 25.0000 mg | ORAL_TABLET | Freq: Two times a day (BID) | ORAL | 3 refills | Status: DC

## 2018-10-06 NOTE — Telephone Encounter (Signed)
Patient given detailed instructions per Myocardial Perfusion Study Information Sheet for the test on 10/12/18. Patient notified to arrive 15 minutes early and that it is imperative to arrive on time for appointment to keep from having the test rescheduled.  If you need to cancel or reschedule your appointment, please call the office within 24 hours of your appointment. . Patient verbalized understanding.Thomas Mcclure

## 2018-10-06 NOTE — Patient Instructions (Addendum)
After Visit Summary:  We will be checking the following labs today - BMET & CBC   Medication Instructions:    Continue with your current medicines. BUT  I am adding Metoprolol 25 mg to take twice a day - this is at your pharmacy.    If you need a refill on your cardiac medications before your next appointment, please call your pharmacy.     Testing/Procedures To Be Arranged:  Arterial lower extremity doppler  Lexiscan Myoview  You are scheduled for a Myocardial Perfusion Imaging Study on ________________________ at ____________________________________.   Please arrive 15 minutes prior to your appointment time for registration and insurance purposes.   The test will take approximately 3 to 4 hours to complete; you may bring reading material. If someone comes with you to your appointment, they will need to remain in the main lobby due to limited space in the testing area.    How to prepare for your Myocardial Perfusion test:   Do not eat or drink 3 hours prior to your test, except you may have water.    Do not consume products containing caffeine (regular or decaffeinated) 12 hours prior to your test (ex: coffee, chocolate, soda, tea)   Do bring a list of your current medications with you. If not listed below, you may take your medications as normal.    Bring any held medication to your appointment, as you may be required to take it once the test is complete.   Do wear comfortable clothes (no dresses or overalls) and walking shoes. Tennis shoes are preferred. No heels or open toed shoes.  Do not wear cologne, perfume, aftershave or lotions (deodorant is allowed).   If these instructions are not followed, you test will have to be rescheduled.   Please report to [address] for your test. If you have questions or concerns about your appointment, please call the Nuclear Lab at [phone].  If you cannot keep your appointment, please provide 24 hour  notification to the Nuclear lab to avoid a possible $50 charge to your account.       Follow-Up:   See [name] - first available    At [facility], you and your health needs are our priority.  As part of our continuing mission to provide you with exceptional heart care, we have created designated Provider Care Teams.  These Care Teams include your primary Cardiologist (physician) and Advanced Practice Providers (APPs -  Physician Assistants and Nurse Practitioners) who all work together to provide you with the care you need, when you need it.  Special Instructions:  . Stay safe, stay home, wash your hands for at least 20 seconds and wear a mask when out in public.  . It was good to talk with you today.    Call the [place] office at [phone] if you have any questions, problems or concerns.

## 2018-10-11 ENCOUNTER — Other Ambulatory Visit (HOSPITAL_COMMUNITY): Payer: Self-pay | Admitting: Nurse Practitioner

## 2018-10-11 DIAGNOSIS — I739 Peripheral vascular disease, unspecified: Secondary | ICD-10-CM

## 2018-10-12 ENCOUNTER — Ambulatory Visit (HOSPITAL_COMMUNITY): Payer: Medicare Other

## 2018-10-12 ENCOUNTER — Ambulatory Visit (HOSPITAL_COMMUNITY): Payer: Medicare Other | Attending: Cardiovascular Disease

## 2018-10-12 ENCOUNTER — Other Ambulatory Visit: Payer: Self-pay

## 2018-10-12 DIAGNOSIS — R0789 Other chest pain: Secondary | ICD-10-CM | POA: Diagnosis present

## 2018-10-12 DIAGNOSIS — R079 Chest pain, unspecified: Secondary | ICD-10-CM

## 2018-10-12 DIAGNOSIS — M79604 Pain in right leg: Secondary | ICD-10-CM | POA: Diagnosis present

## 2018-10-12 DIAGNOSIS — M79605 Pain in left leg: Secondary | ICD-10-CM

## 2018-10-12 DIAGNOSIS — I2510 Atherosclerotic heart disease of native coronary artery without angina pectoris: Secondary | ICD-10-CM | POA: Diagnosis not present

## 2018-10-12 LAB — MYOCARDIAL PERFUSION IMAGING
LV dias vol: 116 mL (ref 62–150)
LV sys vol: 68 mL
Peak HR: 93 {beats}/min
Rest HR: 81 {beats}/min
SDS: 1
SRS: 5
SSS: 7
TID: 0.97

## 2018-10-12 MED ORDER — TECHNETIUM TC 99M TETROFOSMIN IV KIT
10.9000 | PACK | Freq: Once | INTRAVENOUS | Status: AC | PRN
  Administered 2018-10-12: 10.9 via INTRAVENOUS
  Filled 2018-10-12: qty 11

## 2018-10-12 MED ORDER — REGADENOSON 0.4 MG/5ML IV SOLN
0.4000 mg | Freq: Once | INTRAVENOUS | Status: AC
  Administered 2018-10-12: 0.4 mg via INTRAVENOUS

## 2018-10-12 MED ORDER — TECHNETIUM TC 99M TETROFOSMIN IV KIT
32.5000 | PACK | Freq: Once | INTRAVENOUS | Status: AC | PRN
  Administered 2018-10-12: 32.5 via INTRAVENOUS
  Filled 2018-10-12: qty 33

## 2018-10-13 ENCOUNTER — Other Ambulatory Visit: Payer: Self-pay | Admitting: *Deleted

## 2018-10-13 DIAGNOSIS — I519 Heart disease, unspecified: Secondary | ICD-10-CM

## 2018-10-13 NOTE — Progress Notes (Signed)
error 

## 2018-10-14 ENCOUNTER — Ambulatory Visit (HOSPITAL_COMMUNITY)
Admission: RE | Admit: 2018-10-14 | Discharge: 2018-10-14 | Disposition: A | Discharge status: Home / Self Care | Payer: Medicare Other | Source: Ambulatory Visit | Attending: Cardiology | Admitting: Cardiology

## 2018-10-14 ENCOUNTER — Other Ambulatory Visit: Payer: Self-pay

## 2018-10-14 DIAGNOSIS — I739 Peripheral vascular disease, unspecified: Secondary | ICD-10-CM | POA: Diagnosis present

## 2018-10-15 ENCOUNTER — Ambulatory Visit (HOSPITAL_COMMUNITY): Payer: Medicare Other | Attending: Cardiovascular Disease

## 2018-10-15 DIAGNOSIS — I2510 Atherosclerotic heart disease of native coronary artery without angina pectoris: Secondary | ICD-10-CM | POA: Diagnosis not present

## 2018-10-15 DIAGNOSIS — E785 Hyperlipidemia, unspecified: Secondary | ICD-10-CM | POA: Insufficient documentation

## 2018-10-15 DIAGNOSIS — I110 Hypertensive heart disease with heart failure: Secondary | ICD-10-CM | POA: Diagnosis not present

## 2018-10-15 DIAGNOSIS — I509 Heart failure, unspecified: Secondary | ICD-10-CM | POA: Insufficient documentation

## 2018-10-15 DIAGNOSIS — I252 Old myocardial infarction: Secondary | ICD-10-CM | POA: Diagnosis not present

## 2018-10-15 DIAGNOSIS — E119 Type 2 diabetes mellitus without complications: Secondary | ICD-10-CM | POA: Insufficient documentation

## 2018-10-15 DIAGNOSIS — I519 Heart disease, unspecified: Secondary | ICD-10-CM

## 2018-10-20 ENCOUNTER — Other Ambulatory Visit: Payer: Self-pay | Admitting: Neurosurgery

## 2018-10-20 ENCOUNTER — Telehealth: Payer: Self-pay | Admitting: Nurse Practitioner

## 2018-10-20 DIAGNOSIS — G8929 Other chronic pain: Secondary | ICD-10-CM

## 2018-10-20 DIAGNOSIS — M545 Low back pain, unspecified: Secondary | ICD-10-CM

## 2018-10-20 NOTE — Telephone Encounter (Signed)
Phone call to patient to verify medication list and allergies for myelogram procedure. Pt instructed to hold Cymbalta for 48hrs prior to myelogram appointment time. Pt verbalized understanding. Pre and post procedure instructions reviewed with pt. 

## 2018-10-26 ENCOUNTER — Other Ambulatory Visit: Payer: Self-pay

## 2018-10-26 ENCOUNTER — Ambulatory Visit
Admission: RE | Admit: 2018-10-26 | Discharge: 2018-10-26 | Disposition: A | Discharge status: Home / Self Care | Payer: Medicare Other | Source: Ambulatory Visit | Attending: Neurosurgery | Admitting: Neurosurgery

## 2018-10-26 DIAGNOSIS — G8929 Other chronic pain: Secondary | ICD-10-CM

## 2018-10-26 MED ORDER — DIAZEPAM 5 MG PO TABS
10.0000 mg | ORAL_TABLET | Freq: Once | ORAL | Status: AC
  Administered 2018-10-26: 10 mg via ORAL

## 2018-10-26 MED ORDER — MEPERIDINE HCL 50 MG/ML IJ SOLN
50.0000 mg | Freq: Once | INTRAMUSCULAR | Status: AC
  Administered 2018-10-26: 50 mg via INTRAMUSCULAR

## 2018-10-26 MED ORDER — ONDANSETRON HCL 4 MG/2ML IJ SOLN
4.0000 mg | Freq: Once | INTRAMUSCULAR | Status: AC
  Administered 2018-10-26: 4 mg via INTRAMUSCULAR

## 2018-10-26 MED ORDER — IOPAMIDOL (ISOVUE-M 200) INJECTION 41%
15.0000 mL | Freq: Once | INTRAMUSCULAR | Status: AC
  Administered 2018-10-26: 15 mL via INTRATHECAL

## 2018-10-26 NOTE — Discharge Instructions (Signed)
Myelogram Discharge Instructions  1. Go home and rest quietly for the next 24 hours.  It is important to lie flat for the next 24 hours.  Get up only to go to the restroom.  You may lie in the bed or on a couch on your back, your stomach, your left side or your right side.  You may have one pillow under your head.  You may have pillows between your knees while you are on your side or under your knees while you are on your back.  2. DO NOT drive today.  Recline the seat as far back as it will go, while still wearing your seat belt, on the way home.  3. You may get up to go to the bathroom as needed.  You may sit up for 10 minutes to eat.  You may resume your normal diet and medications unless otherwise indicated.  Drink lots of extra fluids today and tomorrow.  4. The incidence of headache, nausea, or vomiting is about 5% (one in 20 patients).  If you develop a headache, lie flat and drink plenty of fluids until the headache goes away.  Caffeinated beverages may be helpful.  If you develop severe nausea and vomiting or a headache that does not go away with flat bed rest, call [phone].  5. You may resume normal activities after your 24 hours of bed rest is over; however, do not exert yourself strongly or do any heavy lifting tomorrow. If when you get up you have a headache when standing, go back to bed and force fluids for another 24 hours.  6. Call your physician for a follow-up appointment.  The results of your myelogram will be sent directly to your physician by the following day.  7. If you have any questions or if complications develop after you arrive home, please call [phone].  Discharge instructions have been explained to the patient.  The patient, or the person responsible for the patient, fully understands these instructions.  YOU MAY RESTART YOUR CYMBALTA TOMORROW 10/27/2018 AT 09:30AM.

## 2018-10-27 ENCOUNTER — Ambulatory Visit: Payer: Medicare Other | Admitting: Nurse Practitioner

## 2018-11-03 ENCOUNTER — Telehealth: Payer: Self-pay | Admitting: *Deleted

## 2018-11-03 ENCOUNTER — Other Ambulatory Visit: Payer: Self-pay | Admitting: Neurosurgery

## 2018-11-03 NOTE — Telephone Encounter (Signed)
   [facility] Pre-operative Risk Assessment    Request for surgical clearance:  1. What type of surgery is being performed? LUMBAR FUSION   2. When is this surgery scheduled? 11/17/18   3. What type of clearance is required (medical clearance vs. Pharmacy clearance to hold med vs. Both)? MEDICAL  4. Are there any medications that need to be held prior to surgery and how long? ASA    5. Practice name and name of physician performing surgery?  [facility]; DR. JEFFREY JENKINS   6. What is your office phone number [phone]    7.   What is your office fax number [phone]  8.   Anesthesia type (None, local, MAC, general) ? GENERAL    Thomas Mcclure 11/03/2018, 11:30 AM  _________________________________________________________________   (provider comments below)

## 2018-11-05 ENCOUNTER — Telehealth: Payer: Self-pay | Admitting: Interventional Cardiology

## 2018-11-05 NOTE — Telephone Encounter (Signed)
I spoke to the patient's wife who called to let us know that he will not be able to see Dr [name] on upcoming appointment 9/28.  He is scheduled for his surgery that day.  Please call to revise schedule.  Thank you.

## 2018-11-05 NOTE — Telephone Encounter (Signed)
Patient's wife calling about upcoming he has with [name] on 9/28, she stating he needs to coming in sooner as he is now having surgery that day.

## 2018-11-05 NOTE — Telephone Encounter (Signed)
Okay to hold aspirin as needed for surgery.

## 2018-11-08 NOTE — Telephone Encounter (Signed)
Spoke with wife and they were leaving the doctor's office.  She said she would call back as soon as they got home.

## 2018-11-08 NOTE — Telephone Encounter (Signed)
[name] states someone from the office in regards to clearance.  Advised I will send to pre op team to f/u.

## 2018-11-08 NOTE — Telephone Encounter (Signed)
Spoke with wife and rescheduled pt to November.  Advised to call the office if any issues and we can see about getting him in sooner.

## 2018-11-08 NOTE — Telephone Encounter (Signed)
   Primary Cardiologist: Sinclair Grooms, MD  Chart reviewed as part of pre-operative protocol coverage. Patient was contacted 11/08/2018 in reference to pre-operative risk assessment for pending surgery as outlined below.  Thomas Mcclure was last seen on 10/06/18 by Truitt Merle, NP and evaluated for some chest pain, no ischemia on stress test and echo with minimal decrease in Ef to 45-50%.  Pt does have a hx of CAD with pror NSTEMI in 2017 and DES to RCA, LV aneurysm.  Last cath 2018 with patent stent.  Since that day, JAMAREON FRANCKE has done well with no further chest pain.   Therefore, based on ACC/AHA guidelines, the patient would be at acceptable risk for the planned procedure without further cardiovascular testing.  OK to hold asprin for procedure and resume after when stable.    I will route this recommendation to the requesting party via Epic fax function and remove from pre-op pool.  Please call with questions.  Cecilie Kicks, NP 11/08/2018, 1:49 PM

## 2018-11-17 NOTE — Pre-Procedure Instructions (Signed)
RANDOLL BIR  11/17/2018      [facility] [address], [address] [place] [address] Phone: [phone] Fax: [phone]    Your procedure is scheduled on 11/22/18.  Report to [facility] Admitting at 530 A.M.  Call this number if you have problems the morning of surgery:  [phone]   Remember:  Do not eat or drink after midnight.  Y   Take these medicines the morning of surgery with A SIP OF WATER ---TYLENOL,HYGROTON,CYMBALTA,METOPROLOL,OXYCODONE,  XTAMPZA  Do not wear jewelry, make-up or nail polish.  Do not wear lotions, powders, or perfumes, or deodorant.  Do not shave 48 hours prior to surgery.  Men may shave face and neck.  Do not bring valuables to the hospital.  [facility] is not responsible for any belongings or valuables.  Contacts, dentures or bridgework may not be worn into surgery.  Leave your suitcase in the car.  After surgery it may be brought to your room.  For patients admitted to the hospital, discharge time will be determined by your treatment team.  Patients discharged the day of surgery will not be allowed to drive home.    Special instructions:  *Do not take any aspirin,anti-inflammatories,vitamins,or herbal supplements 5-7 days prior to surgery. Please read over the following fact sheets that you were given. MRSA Information [place] - Preparing for Surgery  Before surgery, you can play an important role.  Because skin is not sterile, your skin needs to be as free of germs as possible.  You can reduce the number of germs on you skin by washing with CHG (chlorahexidine gluconate) soap before surgery.  CHG is an antiseptic cleaner which kills germs and bonds with the skin to continue killing germs even after washing.  Oral Hygiene is also important in reducing the risk of infection.  Remember to brush your teeth with your regular  toothpaste the morning of surgery.  Please DO NOT use if you have an allergy to CHG or antibacterial soaps.  If your skin becomes reddened/irritated stop using the CHG and inform your nurse when you arrive at Short Stay.  Do not shave (including legs and underarms) for at least 48 hours prior to the first CHG shower.  You may shave your face.  Please follow these instructions carefully:   1.  Shower with CHG Soap the night before surgery and the morning of Surgery.  2.  If you choose to wash your hair, wash your hair first as usual with your normal shampoo.  3.  After you shampoo, rinse your hair and body thoroughly to remove the shampoo. 4.  Use CHG as you would any other liquid soap.  You can apply chg directly to the skin and wash gently with a      scrungie or washcloth.           5.  Apply the CHG Soap to your body ONLY FROM THE NECK DOWN.   Do not use on open wounds or open sores. Avoid contact with your eyes, ears, mouth and genitals (private parts).  Wash genitals (private parts) with your normal soap.  6.  Wash thoroughly, paying special attention to the area where your surgery will be performed.  7.  Thoroughly rinse your body with warm water from the neck down.  8.  DO NOT shower/wash with your normal soap after using  and rinsing off the CHG Soap.  9.  Pat yourself dry with a clean towel.            10.  Wear clean pajamas.            11.  Place clean sheets on your bed the night of your first shower and do not sleep with pets.  Day of Surgery  Do not apply any lotions/deoderants the morning of surgery.   Please wear clean clothes to the hospital/surgery center. Remember to brush your teeth with toothpaste.

## 2018-11-18 ENCOUNTER — Other Ambulatory Visit: Payer: Self-pay

## 2018-11-18 ENCOUNTER — Encounter (HOSPITAL_COMMUNITY)
Admission: RE | Admit: 2018-11-18 | Discharge: 2018-11-18 | Disposition: A | Discharge status: Home / Self Care | Payer: Medicare Other | Source: Ambulatory Visit | Attending: Neurosurgery | Admitting: Neurosurgery

## 2018-11-18 ENCOUNTER — Other Ambulatory Visit: Payer: Self-pay | Admitting: Neurosurgery

## 2018-11-18 ENCOUNTER — Encounter (HOSPITAL_COMMUNITY): Payer: Self-pay

## 2018-11-18 ENCOUNTER — Other Ambulatory Visit (HOSPITAL_COMMUNITY)
Admission: RE | Admit: 2018-11-18 | Discharge: 2018-11-18 | Disposition: A | Discharge status: Home / Self Care | Payer: Medicare Other | Source: Ambulatory Visit | Attending: Neurosurgery | Admitting: Neurosurgery

## 2018-11-18 DIAGNOSIS — Z20828 Contact with and (suspected) exposure to other viral communicable diseases: Secondary | ICD-10-CM | POA: Insufficient documentation

## 2018-11-18 DIAGNOSIS — Z01812 Encounter for preprocedural laboratory examination: Secondary | ICD-10-CM | POA: Insufficient documentation

## 2018-11-18 LAB — CBC
HCT: 39.2 % (ref 39.0–52.0)
Hemoglobin: 12.3 g/dL — ABNORMAL LOW (ref 13.0–17.0)
MCH: 26.3 pg (ref 26.0–34.0)
MCHC: 31.4 g/dL (ref 30.0–36.0)
MCV: 83.9 fL (ref 80.0–100.0)
Platelets: 328 10*3/uL (ref 150–400)
RBC: 4.67 MIL/uL (ref 4.22–5.81)
RDW: 16.3 % — ABNORMAL HIGH (ref 11.5–15.5)
WBC: 7.3 10*3/uL (ref 4.0–10.5)
nRBC: 0 % (ref 0.0–0.2)

## 2018-11-18 LAB — TYPE AND SCREEN
ABO/RH(D): B POS
Antibody Screen: NEGATIVE

## 2018-11-18 LAB — BASIC METABOLIC PANEL
Anion gap: 12 (ref 5–15)
BUN: 12 mg/dL (ref 8–23)
CO2: 25 mmol/L (ref 22–32)
Calcium: 9.3 mg/dL (ref 8.9–10.3)
Chloride: 99 mmol/L (ref 98–111)
Creatinine, Ser: 0.97 mg/dL (ref 0.61–1.24)
GFR calc Af Amer: >60 mL/min (ref >60)
GFR calc non Af Amer: >60 mL/min (ref >60)
Glucose, Bld: 151 mg/dL — ABNORMAL HIGH (ref 70–99)
Potassium: 3.9 mmol/L (ref 3.5–5.1)
Sodium: 136 mmol/L (ref 135–145)

## 2018-11-18 LAB — GLUCOSE, CAPILLARY: Glucose-Capillary: 147 mg/dL — ABNORMAL HIGH (ref 70–99)

## 2018-11-18 LAB — SURGICAL PCR SCREEN
MRSA, PCR: NEGATIVE
Staphylococcus aureus: NEGATIVE

## 2018-11-18 MED ORDER — CHLORHEXIDINE GLUCONATE CLOTH 2 % EX PADS: 6.0000 | MEDICATED_PAD | Freq: Once | CUTANEOUS | Status: DC

## 2018-11-18 NOTE — Progress Notes (Signed)
PCP - DR Lavonia Drafts  [facility] Cardiologist - [place]    Chest x-ray - 2/20 EKG - 2/20  ECHO - 8/20 Cardiac Cath - 12/18  Sleep Study - yesCPAP -   no  Fasting Blood Sugar - tba Checks Blood Sugar __1___ times a day   Aspirin Instructions:stop     COVID TEST- today   Anesthesia review: ekg,heart hx.--clearance done by Dr [name] in chart  Patient denies shortness of breath, fever, cough and chest pain at PAT appointment   Patient verbalized understanding of instructions that were given to them at the PAT appointment. Patient was also instructed that they will need to review over the PAT instructions again at home before surgery.

## 2018-11-19 LAB — NOVEL CORONAVIRUS, NAA (HOSP ORDER, SEND-OUT TO REF LAB; TAT 18-24 HRS): SARS-CoV-2, NAA: NOT DETECTED

## 2018-11-19 LAB — HEMOGLOBIN A1C
Hgb A1c MFr Bld: 7.2 % — ABNORMAL HIGH (ref 4.8–5.6)
Mean Plasma Glucose: 160 mg/dL

## 2018-11-19 MED ORDER — DEXTROSE 5 % IV SOLN
3.0000 g | Freq: Once | INTRAVENOUS | Status: AC
  Administered 2018-11-22 (×2): 3 g via INTRAVENOUS
  Filled 2018-11-19: qty 3

## 2018-11-19 NOTE — Anesthesia Preprocedure Evaluation (Addendum)
Anesthesia Evaluation  Patient identified by MRN, date of birth, ID band Patient awake    Reviewed: Allergy & Precautions, NPO status , Patient's Chart, lab work & pertinent test results  Airway Mallampati: II  TM Distance: >3 FB Neck ROM: Full    Dental  (+) Missing, Dental Advisory Given   Pulmonary former smoker,    breath sounds clear to auscultation       Cardiovascular hypertension, + angina + CAD and + Past MI   Rhythm:Regular Rate:Normal     Neuro/Psych    GI/Hepatic Neg liver ROS, GERD  ,  Endo/Other  diabetes  Renal/GU      Musculoskeletal   Abdominal   Peds  Hematology   Anesthesia Other Findings   Reproductive/Obstetrics                         Anesthesia Physical Anesthesia Plan  ASA: III  Anesthesia Plan: General   Post-op Pain Management:    Induction:   PONV Risk Score and Plan: 2 and Ondansetron, Dexamethasone and Midazolam  Airway Management Planned: Oral ETT  Additional Equipment:   Intra-op Plan:   Post-operative Plan: Possible Post-op intubation/ventilation  Informed Consent:   Plan Discussed with:   Anesthesia Plan Comments: (Follows with cardiology for hx of CAD with prior NSTEMI in 2017 with DES to the RCA - complicated by LV aneurysm, combined systolic/diastolic HF.  Clearance per telephone encounter 11/08/18: "Thomas Mcclure was last seen on 10/06/18 by Thomas Merle, NP and evaluated for some chest pain, no ischemia on stress test and echo with minimal decrease in Ef to 45-50%.  Pt does have a hx of CAD with pror NSTEMI in 2017 and DES to RCA, LV aneurysm.  Last cath 2018 with patent stent.  Since that day, Thomas Mcclure has done well with no further chest pain. Therefore, based on ACC/AHA guidelines, the patient would be at acceptable risk for the planned procedure without further cardiovascular testing.  OK to hold asprin for procedure and resume  after when stable."  TTE 10/15/18: 1. The left ventricle has mildly reduced systolic function, with an ejection fraction of 45-50%. The cavity size was normal. There is mildly increased left ventricular wall thickness. Left ventricular diastolic Doppler parameters are consistent with  impaired relaxation.  2. Abnormal septal motion inferior basal hypokinesis.  3. The right ventricle has normal systolic function. The cavity was normal. There is no increase in right ventricular wall thickness.  4. Mild thickening of the mitral valve leaflet. Mild calcification of the mitral valve leaflet. There is mild mitral annular calcification present.  5. The aortic valve is tricuspid. Mild thickening of the aortic valve. Sclerosis without any evidence of stenosis of the aortic valve.  6. The aorta is normal unless otherwise noted.  7. The interatrial septum was not well visualized.)       Anesthesia Quick Evaluation

## 2018-11-22 ENCOUNTER — Inpatient Hospital Stay (HOSPITAL_COMMUNITY): Payer: Medicare Other

## 2018-11-22 ENCOUNTER — Inpatient Hospital Stay (HOSPITAL_COMMUNITY): Payer: Medicare Other | Admitting: Physician Assistant

## 2018-11-22 ENCOUNTER — Inpatient Hospital Stay (HOSPITAL_COMMUNITY): Payer: Medicare Other | Admitting: Anesthesiology

## 2018-11-22 ENCOUNTER — Ambulatory Visit: Payer: Medicare Other | Admitting: Interventional Cardiology

## 2018-11-22 ENCOUNTER — Encounter (HOSPITAL_COMMUNITY): Payer: Self-pay | Admitting: Certified Registered Nurse Anesthetist

## 2018-11-22 ENCOUNTER — Inpatient Hospital Stay (HOSPITAL_COMMUNITY)
Admission: RE | Disposition: A | Discharge status: Home Health | Payer: Self-pay | Source: Home / Self Care | Attending: Neurosurgery

## 2018-11-22 ENCOUNTER — Other Ambulatory Visit: Payer: Self-pay

## 2018-11-22 ENCOUNTER — Inpatient Hospital Stay (HOSPITAL_COMMUNITY)
Admission: RE | Admit: 2018-11-22 | Discharge: 2018-11-26 | DRG: 454 | Disposition: A | Discharge status: Home Health | Payer: Medicare Other | Attending: Neurosurgery | Admitting: Neurosurgery

## 2018-11-22 DIAGNOSIS — Z419 Encounter for procedure for purposes other than remedying health state, unspecified: Secondary | ICD-10-CM

## 2018-11-22 DIAGNOSIS — Z7982 Long term (current) use of aspirin: Secondary | ICD-10-CM | POA: Diagnosis not present

## 2018-11-22 DIAGNOSIS — E785 Hyperlipidemia, unspecified: Secondary | ICD-10-CM | POA: Diagnosis present

## 2018-11-22 DIAGNOSIS — Z888 Allergy status to other drugs, medicaments and biological substances status: Secondary | ICD-10-CM

## 2018-11-22 DIAGNOSIS — R000 Tachycardia, unspecified: Secondary | ICD-10-CM | POA: Diagnosis not present

## 2018-11-22 DIAGNOSIS — I252 Old myocardial infarction: Secondary | ICD-10-CM | POA: Diagnosis not present

## 2018-11-22 DIAGNOSIS — Z7984 Long term (current) use of oral hypoglycemic drugs: Secondary | ICD-10-CM

## 2018-11-22 DIAGNOSIS — M069 Rheumatoid arthritis, unspecified: Secondary | ICD-10-CM | POA: Diagnosis present

## 2018-11-22 DIAGNOSIS — Z8249 Family history of ischemic heart disease and other diseases of the circulatory system: Secondary | ICD-10-CM | POA: Diagnosis not present

## 2018-11-22 DIAGNOSIS — I255 Ischemic cardiomyopathy: Secondary | ICD-10-CM | POA: Diagnosis present

## 2018-11-22 DIAGNOSIS — G8929 Other chronic pain: Secondary | ICD-10-CM | POA: Diagnosis present

## 2018-11-22 DIAGNOSIS — K219 Gastro-esophageal reflux disease without esophagitis: Secondary | ICD-10-CM | POA: Diagnosis present

## 2018-11-22 DIAGNOSIS — Z6836 Body mass index (BMI) 36.0-36.9, adult: Secondary | ICD-10-CM | POA: Diagnosis not present

## 2018-11-22 DIAGNOSIS — Z79891 Long term (current) use of opiate analgesic: Secondary | ICD-10-CM

## 2018-11-22 DIAGNOSIS — M48062 Spinal stenosis, lumbar region with neurogenic claudication: Secondary | ICD-10-CM | POA: Diagnosis present

## 2018-11-22 DIAGNOSIS — Z87891 Personal history of nicotine dependence: Secondary | ICD-10-CM | POA: Diagnosis not present

## 2018-11-22 DIAGNOSIS — E119 Type 2 diabetes mellitus without complications: Secondary | ICD-10-CM | POA: Diagnosis present

## 2018-11-22 DIAGNOSIS — Z955 Presence of coronary angioplasty implant and graft: Secondary | ICD-10-CM

## 2018-11-22 DIAGNOSIS — Z79899 Other long term (current) drug therapy: Secondary | ICD-10-CM | POA: Diagnosis not present

## 2018-11-22 DIAGNOSIS — D62 Acute posthemorrhagic anemia: Secondary | ICD-10-CM | POA: Diagnosis not present

## 2018-11-22 DIAGNOSIS — E669 Obesity, unspecified: Secondary | ICD-10-CM | POA: Diagnosis present

## 2018-11-22 DIAGNOSIS — I10 Essential (primary) hypertension: Secondary | ICD-10-CM | POA: Diagnosis present

## 2018-11-22 DIAGNOSIS — M549 Dorsalgia, unspecified: Secondary | ICD-10-CM | POA: Diagnosis present

## 2018-11-22 DIAGNOSIS — Z91048 Other nonmedicinal substance allergy status: Secondary | ICD-10-CM

## 2018-11-22 DIAGNOSIS — I2510 Atherosclerotic heart disease of native coronary artery without angina pectoris: Secondary | ICD-10-CM | POA: Diagnosis present

## 2018-11-22 DIAGNOSIS — M5116 Intervertebral disc disorders with radiculopathy, lumbar region: Secondary | ICD-10-CM | POA: Diagnosis present

## 2018-11-22 HISTORY — PX: POSTERIOR LUMBAR FUSION 4 LEVEL: SHX6037

## 2018-11-22 LAB — GLUCOSE, CAPILLARY
Glucose-Capillary: 96 mg/dL (ref 70–99)
Glucose-Capillary: 154 mg/dL — ABNORMAL HIGH (ref 70–99)
Glucose-Capillary: 176 mg/dL — ABNORMAL HIGH (ref 70–99)
Glucose-Capillary: 194 mg/dL — ABNORMAL HIGH (ref 70–99)

## 2018-11-22 SURGERY — POSTERIOR LUMBAR FUSION 4 LEVEL
Anesthesia: General

## 2018-11-22 MED ORDER — SUGAMMADEX SODIUM 500 MG/5ML IV SOLN
INTRAVENOUS | Status: AC
  Filled 2018-11-22: qty 5

## 2018-11-22 MED ORDER — INSULIN ASPART 100 UNIT/ML ~~LOC~~ SOLN
0.0000 [IU] | Freq: Three times a day (TID) | SUBCUTANEOUS | Status: DC
  Administered 2018-11-22: 4 [IU] via SUBCUTANEOUS
  Administered 2018-11-23: 3 [IU] via SUBCUTANEOUS
  Administered 2018-11-23: 7 [IU] via SUBCUTANEOUS
  Administered 2018-11-24: 07:00:00 4 [IU] via SUBCUTANEOUS
  Administered 2018-11-24 (×2): 3 [IU] via SUBCUTANEOUS
  Administered 2018-11-25: 4 [IU] via SUBCUTANEOUS
  Administered 2018-11-25: 3 [IU] via SUBCUTANEOUS
  Administered 2018-11-25 – 2018-11-26 (×2): 4 [IU] via SUBCUTANEOUS

## 2018-11-22 MED ORDER — 0.9 % SODIUM CHLORIDE (POUR BTL) OPTIME
TOPICAL | Status: DC | PRN
  Administered 2018-11-22 (×2): 1000 mL

## 2018-11-22 MED ORDER — SODIUM CHLORIDE 0.9 % IV SOLN: 250.0000 mL | INTRAVENOUS | Status: DC

## 2018-11-22 MED ORDER — POTASSIUM CHLORIDE CRYS ER 20 MEQ PO TBCR
20.0000 meq | EXTENDED_RELEASE_TABLET | Freq: Every day | ORAL | Status: DC
  Administered 2018-11-23 – 2018-11-26 (×4): 20 meq via ORAL
  Filled 2018-11-22 (×4): qty 1

## 2018-11-22 MED ORDER — LACTATED RINGERS IV SOLN
INTRAVENOUS | Status: DC | PRN
  Administered 2018-11-22 (×3): via INTRAVENOUS

## 2018-11-22 MED ORDER — ROCURONIUM BROMIDE 10 MG/ML (PF) SYRINGE
PREFILLED_SYRINGE | INTRAVENOUS | Status: AC
  Filled 2018-11-22: qty 10

## 2018-11-22 MED ORDER — CYCLOBENZAPRINE HCL 10 MG PO TABS
10.0000 mg | ORAL_TABLET | Freq: Three times a day (TID) | ORAL | Status: DC | PRN
  Administered 2018-11-22 – 2018-11-25 (×5): 10 mg via ORAL
  Filled 2018-11-22 (×5): qty 1

## 2018-11-22 MED ORDER — BUPIVACAINE-EPINEPHRINE (PF) 0.5% -1:200000 IJ SOLN
INTRAMUSCULAR | Status: DC | PRN
  Administered 2018-11-22: 10 mL via PERINEURAL

## 2018-11-22 MED ORDER — ESMOLOL HCL 100 MG/10ML IV SOLN
INTRAVENOUS | Status: AC
  Filled 2018-11-22: qty 10

## 2018-11-22 MED ORDER — FENTANYL CITRATE (PF) 100 MCG/2ML IJ SOLN
INTRAMUSCULAR | Status: AC
  Filled 2018-11-22: qty 2

## 2018-11-22 MED ORDER — BUPIVACAINE LIPOSOME 1.3 % IJ SUSP
20.0000 mL | INTRAMUSCULAR | Status: DC
  Filled 2018-11-22: qty 20

## 2018-11-22 MED ORDER — THROMBIN 5000 UNITS EX SOLR
CUTANEOUS | Status: AC
  Filled 2018-11-22: qty 5000

## 2018-11-22 MED ORDER — ONDANSETRON HCL 4 MG/2ML IJ SOLN: 4.0000 mg | Freq: Four times a day (QID) | INTRAMUSCULAR | Status: DC | PRN

## 2018-11-22 MED ORDER — CEFAZOLIN SODIUM 1 G IJ SOLR
INTRAMUSCULAR | Status: AC
  Filled 2018-11-22: qty 50

## 2018-11-22 MED ORDER — ACETAMINOPHEN 650 MG RE SUPP: 650.0000 mg | RECTAL | Status: DC | PRN

## 2018-11-22 MED ORDER — OXYCODONE HCL 5 MG PO TABS
ORAL_TABLET | ORAL | Status: AC
  Filled 2018-11-22: qty 3

## 2018-11-22 MED ORDER — OXYCODONE ER 36 MG PO C12A: 36.0000 mg | EXTENDED_RELEASE_CAPSULE | Freq: Every day | ORAL | Status: DC

## 2018-11-22 MED ORDER — NITROGLYCERIN 0.4 MG SL SUBL: 0.4000 mg | SUBLINGUAL_TABLET | SUBLINGUAL | Status: DC | PRN

## 2018-11-22 MED ORDER — MORPHINE SULFATE (PF) 4 MG/ML IV SOLN
4.0000 mg | INTRAVENOUS | Status: DC | PRN
  Administered 2018-11-22 – 2018-11-23 (×7): 4 mg via INTRAVENOUS
  Filled 2018-11-22 (×7): qty 1

## 2018-11-22 MED ORDER — PHENOL 1.4 % MT LIQD: 1.0000 | OROMUCOSAL | Status: DC | PRN

## 2018-11-22 MED ORDER — BUPRENORPHINE HCL 300 MCG BU FILM: 300.0000 ug | ORAL_FILM | Freq: Two times a day (BID) | BUCCAL | Status: DC

## 2018-11-22 MED ORDER — POLYETHYLENE GLYCOL 3350 17 G PO PACK
17.0000 g | PACK | Freq: Every day | ORAL | Status: DC
  Administered 2018-11-24 – 2018-11-26 (×3): 17 g via ORAL
  Filled 2018-11-22 (×4): qty 1

## 2018-11-22 MED ORDER — DULOXETINE HCL 60 MG PO CPEP
60.0000 mg | ORAL_CAPSULE | Freq: Every day | ORAL | Status: DC
  Administered 2018-11-23 – 2018-11-26 (×4): 60 mg via ORAL
  Filled 2018-11-22 (×2): qty 1
  Filled 2018-11-22: qty 2
  Filled 2018-11-22: qty 1

## 2018-11-22 MED ORDER — ROCURONIUM BROMIDE 10 MG/ML (PF) SYRINGE
PREFILLED_SYRINGE | INTRAVENOUS | Status: DC | PRN
  Administered 2018-11-22: 20 mg via INTRAVENOUS
  Administered 2018-11-22: 30 mg via INTRAVENOUS
  Administered 2018-11-22: 40 mg via INTRAVENOUS
  Administered 2018-11-22: 30 mg via INTRAVENOUS
  Administered 2018-11-22: 60 mg via INTRAVENOUS
  Administered 2018-11-22 (×2): 20 mg via INTRAVENOUS

## 2018-11-22 MED ORDER — PHENYLEPHRINE HCL (PRESSORS) 10 MG/ML IV SOLN
INTRAVENOUS | Status: AC
  Filled 2018-11-22: qty 1

## 2018-11-22 MED ORDER — SUCCINYLCHOLINE CHLORIDE 200 MG/10ML IV SOSY
PREFILLED_SYRINGE | INTRAVENOUS | Status: AC
  Filled 2018-11-22: qty 10

## 2018-11-22 MED ORDER — LIDOCAINE 2% (20 MG/ML) 5 ML SYRINGE
INTRAMUSCULAR | Status: AC
  Filled 2018-11-22: qty 5

## 2018-11-22 MED ORDER — BACITRACIN ZINC 500 UNIT/GM EX OINT
TOPICAL_OINTMENT | CUTANEOUS | Status: AC
  Filled 2018-11-22: qty 28.35

## 2018-11-22 MED ORDER — FENTANYL CITRATE (PF) 250 MCG/5ML IJ SOLN
INTRAMUSCULAR | Status: AC
  Filled 2018-11-22: qty 5

## 2018-11-22 MED ORDER — PROPOFOL 10 MG/ML IV BOLUS
INTRAVENOUS | Status: DC | PRN
  Administered 2018-11-22: 50 mg via INTRAVENOUS
  Administered 2018-11-22: 150 mg via INTRAVENOUS

## 2018-11-22 MED ORDER — CHLORTHALIDONE 25 MG PO TABS
25.0000 mg | ORAL_TABLET | Freq: Every day | ORAL | Status: DC
  Administered 2018-11-23 – 2018-11-26 (×4): 25 mg via ORAL
  Filled 2018-11-22 (×4): qty 1

## 2018-11-22 MED ORDER — GLIPIZIDE 5 MG PO TABS
5.0000 mg | ORAL_TABLET | Freq: Every day | ORAL | Status: DC
  Administered 2018-11-23 – 2018-11-26 (×4): 5 mg via ORAL
  Filled 2018-11-22 (×4): qty 1

## 2018-11-22 MED ORDER — BISACODYL 10 MG RE SUPP: 10.0000 mg | Freq: Every day | RECTAL | Status: DC | PRN

## 2018-11-22 MED ORDER — FENTANYL CITRATE (PF) 250 MCG/5ML IJ SOLN
INTRAMUSCULAR | Status: DC | PRN
  Administered 2018-11-22: 25 ug via INTRAVENOUS
  Administered 2018-11-22 (×2): 50 ug via INTRAVENOUS
  Administered 2018-11-22: 25 ug via INTRAVENOUS
  Administered 2018-11-22: 50 ug via INTRAVENOUS
  Administered 2018-11-22 (×2): 25 ug via INTRAVENOUS
  Administered 2018-11-22 (×9): 50 ug via INTRAVENOUS

## 2018-11-22 MED ORDER — MENTHOL 3 MG MT LOZG: 1.0000 | LOZENGE | OROMUCOSAL | Status: DC | PRN

## 2018-11-22 MED ORDER — BUPIVACAINE LIPOSOME 1.3 % IJ SUSP
INTRAMUSCULAR | Status: DC | PRN
  Administered 2018-11-22: 20 mL

## 2018-11-22 MED ORDER — BUPIVACAINE-EPINEPHRINE (PF) 0.5% -1:200000 IJ SOLN
INTRAMUSCULAR | Status: AC
  Filled 2018-11-22: qty 30

## 2018-11-22 MED ORDER — FENTANYL CITRATE (PF) 100 MCG/2ML IJ SOLN
25.0000 ug | INTRAMUSCULAR | Status: DC | PRN
  Administered 2018-11-22 (×3): 50 ug via INTRAVENOUS

## 2018-11-22 MED ORDER — BACITRACIN ZINC 500 UNIT/GM EX OINT
TOPICAL_OINTMENT | CUTANEOUS | Status: DC | PRN
  Administered 2018-11-22: 1 via TOPICAL

## 2018-11-22 MED ORDER — OXYCODONE HCL 5 MG PO TABS
15.0000 mg | ORAL_TABLET | ORAL | Status: DC | PRN
  Administered 2018-11-22 – 2018-11-23 (×6): 15 mg via ORAL
  Filled 2018-11-22 (×5): qty 3

## 2018-11-22 MED ORDER — DEXAMETHASONE SODIUM PHOSPHATE 10 MG/ML IJ SOLN
INTRAMUSCULAR | Status: DC | PRN
  Administered 2018-11-22: 10 mg via INTRAVENOUS

## 2018-11-22 MED ORDER — DILTIAZEM HCL ER COATED BEADS 180 MG PO CP24
180.0000 mg | ORAL_CAPSULE | Freq: Every day | ORAL | Status: DC
  Administered 2018-11-23 – 2018-11-26 (×4): 180 mg via ORAL
  Filled 2018-11-22 (×4): qty 1

## 2018-11-22 MED ORDER — PHENYLEPHRINE 40 MCG/ML (10ML) SYRINGE FOR IV PUSH (FOR BLOOD PRESSURE SUPPORT)
PREFILLED_SYRINGE | INTRAVENOUS | Status: AC
  Filled 2018-11-22: qty 10

## 2018-11-22 MED ORDER — LUBIPROSTONE 24 MCG PO CAPS
48.0000 ug | ORAL_CAPSULE | Freq: Every day | ORAL | Status: DC
  Administered 2018-11-22 – 2018-11-25 (×4): 48 ug via ORAL
  Filled 2018-11-22 (×6): qty 2

## 2018-11-22 MED ORDER — LABETALOL HCL 5 MG/ML IV SOLN
INTRAVENOUS | Status: DC | PRN
  Administered 2018-11-22 (×2): 2.5 mg via INTRAVENOUS

## 2018-11-22 MED ORDER — PREGABALIN 75 MG PO CAPS
200.0000 mg | ORAL_CAPSULE | Freq: Two times a day (BID) | ORAL | Status: DC
  Administered 2018-11-22 – 2018-11-26 (×8): 200 mg via ORAL
  Filled 2018-11-22: qty 2
  Filled 2018-11-22: qty 1
  Filled 2018-11-22: qty 2
  Filled 2018-11-22: qty 1
  Filled 2018-11-22 (×4): qty 2

## 2018-11-22 MED ORDER — MIDAZOLAM HCL 2 MG/2ML IJ SOLN
INTRAMUSCULAR | Status: AC
  Filled 2018-11-22: qty 2

## 2018-11-22 MED ORDER — SENNOSIDES-DOCUSATE SODIUM 8.6-50 MG PO TABS
3.0000 | ORAL_TABLET | Freq: Every day | ORAL | Status: DC
  Administered 2018-11-22 – 2018-11-25 (×4): 3 via ORAL
  Filled 2018-11-22 (×4): qty 3

## 2018-11-22 MED ORDER — OXYCODONE HCL ER 15 MG PO T12A
40.0000 mg | EXTENDED_RELEASE_TABLET | Freq: Every day | ORAL | Status: DC
  Administered 2018-11-23 – 2018-11-26 (×4): 40 mg via ORAL
  Filled 2018-11-22 (×4): qty 1

## 2018-11-22 MED ORDER — LABETALOL HCL 5 MG/ML IV SOLN
INTRAVENOUS | Status: AC
  Filled 2018-11-22: qty 4

## 2018-11-22 MED ORDER — METFORMIN HCL ER 500 MG PO TB24
1000.0000 mg | ORAL_TABLET | Freq: Two times a day (BID) | ORAL | Status: DC
  Administered 2018-11-22 – 2018-11-26 (×8): 1000 mg via ORAL
  Filled 2018-11-22 (×10): qty 2

## 2018-11-22 MED ORDER — OXYCODONE HCL 5 MG PO TABS: 5.0000 mg | ORAL_TABLET | ORAL | Status: DC | PRN

## 2018-11-22 MED ORDER — ONDANSETRON HCL 4 MG/2ML IJ SOLN
INTRAMUSCULAR | Status: DC | PRN
  Administered 2018-11-22: 4 mg via INTRAVENOUS

## 2018-11-22 MED ORDER — THROMBIN 5000 UNITS EX SOLR
OROMUCOSAL | Status: DC | PRN
  Administered 2018-11-22 (×2): 5 mL via TOPICAL

## 2018-11-22 MED ORDER — ONDANSETRON HCL 4 MG PO TABS: 4.0000 mg | ORAL_TABLET | Freq: Four times a day (QID) | ORAL | Status: DC | PRN

## 2018-11-22 MED ORDER — SUGAMMADEX SODIUM 500 MG/5ML IV SOLN
INTRAVENOUS | Status: DC | PRN
  Administered 2018-11-22: 300 mg via INTRAVENOUS

## 2018-11-22 MED ORDER — PANTOPRAZOLE SODIUM 40 MG PO TBEC
80.0000 mg | DELAYED_RELEASE_TABLET | Freq: Every day | ORAL | Status: DC
  Administered 2018-11-22 – 2018-11-26 (×5): 80 mg via ORAL
  Filled 2018-11-22 (×5): qty 2

## 2018-11-22 MED ORDER — LISINOPRIL 20 MG PO TABS
20.0000 mg | ORAL_TABLET | Freq: Every day | ORAL | Status: DC
  Administered 2018-11-22 – 2018-11-25 (×4): 20 mg via ORAL
  Filled 2018-11-22 (×4): qty 1

## 2018-11-22 MED ORDER — CEFAZOLIN SODIUM-DEXTROSE 2-4 GM/100ML-% IV SOLN
2.0000 g | Freq: Three times a day (TID) | INTRAVENOUS | Status: AC
  Administered 2018-11-22 – 2018-11-23 (×2): 2 g via INTRAVENOUS
  Filled 2018-11-22 (×2): qty 100

## 2018-11-22 MED ORDER — SODIUM CHLORIDE 0.9 % IV SOLN
INTRAVENOUS | Status: DC | PRN
  Administered 2018-11-22: 500 mL

## 2018-11-22 MED ORDER — PHENYLEPHRINE 40 MCG/ML (10ML) SYRINGE FOR IV PUSH (FOR BLOOD PRESSURE SUPPORT)
PREFILLED_SYRINGE | INTRAVENOUS | Status: DC | PRN
  Administered 2018-11-22 (×4): 80 ug via INTRAVENOUS

## 2018-11-22 MED ORDER — DOCUSATE SODIUM 100 MG PO CAPS
100.0000 mg | ORAL_CAPSULE | Freq: Two times a day (BID) | ORAL | Status: DC
  Administered 2018-11-22 – 2018-11-26 (×8): 100 mg via ORAL
  Filled 2018-11-22 (×8): qty 1

## 2018-11-22 MED ORDER — ONDANSETRON HCL 4 MG/2ML IJ SOLN
INTRAMUSCULAR | Status: AC
  Filled 2018-11-22: qty 2

## 2018-11-22 MED ORDER — CYCLOBENZAPRINE HCL 10 MG PO TABS
ORAL_TABLET | ORAL | Status: AC
  Filled 2018-11-22: qty 1

## 2018-11-22 MED ORDER — SODIUM CHLORIDE 0.9% FLUSH: 3.0000 mL | INTRAVENOUS | Status: DC | PRN

## 2018-11-22 MED ORDER — OXYCODONE HCL 5 MG PO TABS: 10.0000 mg | ORAL_TABLET | ORAL | Status: DC | PRN

## 2018-11-22 MED ORDER — SODIUM CHLORIDE 0.9 % IV SOLN
INTRAVENOUS | Status: DC | PRN
  Administered 2018-11-22: 12:00:00 via INTRAVENOUS
  Administered 2018-11-22: 08:00:00 25 ug/min via INTRAVENOUS

## 2018-11-22 MED ORDER — SODIUM CHLORIDE 0.9% FLUSH
3.0000 mL | Freq: Two times a day (BID) | INTRAVENOUS | Status: DC
  Administered 2018-11-22 – 2018-11-25 (×7): 3 mL via INTRAVENOUS

## 2018-11-22 MED ORDER — MIDAZOLAM HCL 5 MG/5ML IJ SOLN
INTRAMUSCULAR | Status: DC | PRN
  Administered 2018-11-22: 2 mg via INTRAVENOUS

## 2018-11-22 MED ORDER — ATORVASTATIN CALCIUM 80 MG PO TABS
80.0000 mg | ORAL_TABLET | Freq: Every day | ORAL | Status: DC
  Administered 2018-11-22 – 2018-11-25 (×4): 80 mg via ORAL
  Filled 2018-11-22 (×4): qty 1

## 2018-11-22 MED ORDER — THROMBIN 20000 UNITS EX SOLR
CUTANEOUS | Status: DC | PRN
  Administered 2018-11-22: 20 mL via TOPICAL

## 2018-11-22 MED ORDER — SUCCINYLCHOLINE CHLORIDE 200 MG/10ML IV SOSY
PREFILLED_SYRINGE | INTRAVENOUS | Status: DC | PRN
  Administered 2018-11-22: 160 mg via INTRAVENOUS

## 2018-11-22 MED ORDER — THROMBIN 20000 UNITS EX SOLR
CUTANEOUS | Status: AC
  Filled 2018-11-22: qty 20000

## 2018-11-22 MED ORDER — ACETAMINOPHEN 325 MG PO TABS
650.0000 mg | ORAL_TABLET | ORAL | Status: DC | PRN
  Administered 2018-11-22: 650 mg via ORAL
  Filled 2018-11-22 (×2): qty 2

## 2018-11-22 MED ORDER — SODIUM CHLORIDE 0.9 % IV SOLN
INTRAVENOUS | Status: DC | PRN
  Administered 2018-11-22: 13:00:00 via INTRAVENOUS

## 2018-11-22 MED ORDER — PROPOFOL 10 MG/ML IV BOLUS
INTRAVENOUS | Status: AC
  Filled 2018-11-22: qty 20

## 2018-11-22 MED ORDER — ESMOLOL HCL 100 MG/10ML IV SOLN
INTRAVENOUS | Status: DC | PRN
  Administered 2018-11-22: 10 mg via INTRAVENOUS
  Administered 2018-11-22: 30 mg via INTRAVENOUS
  Administered 2018-11-22: 10 mg via INTRAVENOUS
  Administered 2018-11-22: 20 mg via INTRAVENOUS
  Administered 2018-11-22: 10 mg via INTRAVENOUS
  Administered 2018-11-22: 20 mg via INTRAVENOUS

## 2018-11-22 MED ORDER — ACETAMINOPHEN 500 MG PO TABS
1000.0000 mg | ORAL_TABLET | Freq: Four times a day (QID) | ORAL | Status: AC
  Administered 2018-11-22 – 2018-11-23 (×3): 1000 mg via ORAL
  Filled 2018-11-22 (×4): qty 2

## 2018-11-22 MED ORDER — ARTIFICIAL TEARS OPHTHALMIC OINT
TOPICAL_OINTMENT | OPHTHALMIC | Status: DC | PRN
  Administered 2018-11-22: 1 via OPHTHALMIC

## 2018-11-22 MED ORDER — LIDOCAINE 2% (20 MG/ML) 5 ML SYRINGE
INTRAMUSCULAR | Status: DC | PRN
  Administered 2018-11-22: 100 mg via INTRAVENOUS

## 2018-11-22 SURGICAL SUPPLY — 84 items
ADH SKN CLS APL DERMABOND .7 (GAUZE/BANDAGES/DRESSINGS) ×1
APL SKNCLS STERI-STRIP NONHPOA (GAUZE/BANDAGES/DRESSINGS) ×1
BAG DECANTER FOR FLEXI CONT (MISCELLANEOUS) ×3 IMPLANT
BENZOIN TINCTURE PRP APPL 2/3 (GAUZE/BANDAGES/DRESSINGS) ×3 IMPLANT
BLADE CLIPPER SURG (BLADE) IMPLANT
BUR MATCHSTICK NEURO 3.0 LAGG (BURR) ×3 IMPLANT
BUR PRECISION FLUTE 6.0 (BURR) ×3 IMPLANT
CANISTER SUCT 3000ML PPV (MISCELLANEOUS) ×3 IMPLANT
CARTRIDGE OIL MAESTRO DRILL (MISCELLANEOUS) ×1 IMPLANT
CLOSURE WOUND 1/2 X4 (GAUZE/BANDAGES/DRESSINGS) ×1
CONNECTOR 6.35 6.35 (Connector) ×4 IMPLANT
CONT SPEC 4OZ CLIKSEAL STRL BL (MISCELLANEOUS) ×3 IMPLANT
COVER BACK TABLE 60X90IN (DRAPES) ×3 IMPLANT
COVER WAND RF STERILE (DRAPES) ×3 IMPLANT
CROSSLINK DANEK (Cage) ×3 IMPLANT
DECANTER SPIKE VIAL GLASS SM (MISCELLANEOUS) ×3 IMPLANT
DERMABOND ADVANCED (GAUZE/BANDAGES/DRESSINGS) ×2
DERMABOND ADVANCED .7 DNX12 (GAUZE/BANDAGES/DRESSINGS) IMPLANT
DIFFUSER DRILL AIR PNEUMATIC (MISCELLANEOUS) ×3 IMPLANT
DRAPE C-ARM 42X72 X-RAY (DRAPES) ×4 IMPLANT
DRAPE C-ARMOR (DRAPES) ×2 IMPLANT
DRAPE HALF SHEET 40X57 (DRAPES) ×3 IMPLANT
DRAPE LAPAROTOMY 100X72X124 (DRAPES) ×3 IMPLANT
DRAPE SURG 17X23 STRL (DRAPES) ×12 IMPLANT
DRSG OPSITE 4X5.5 SM (GAUZE/BANDAGES/DRESSINGS) ×2 IMPLANT
DRSG OPSITE POSTOP 4X10 (GAUZE/BANDAGES/DRESSINGS) ×2 IMPLANT
DRSG OPSITE POSTOP 4X8 (GAUZE/BANDAGES/DRESSINGS) ×2 IMPLANT
ELECT BLADE 4.0 EZ CLEAN MEGAD (MISCELLANEOUS) ×3
ELECT REM PT RETURN 9FT ADLT (ELECTROSURGICAL) ×3
ELECTRODE BLDE 4.0 EZ CLN MEGD (MISCELLANEOUS) ×1 IMPLANT
ELECTRODE REM PT RTRN 9FT ADLT (ELECTROSURGICAL) ×1 IMPLANT
EVACUATOR 1/8 PVC DRAIN (DRAIN) ×2 IMPLANT
GAUZE 4X4 16PLY RFD (DISPOSABLE) ×3 IMPLANT
GAUZE SPONGE 4X4 12PLY STRL (GAUZE/BANDAGES/DRESSINGS) ×3 IMPLANT
GLOVE BIO SURGEON STRL SZ7.5 (GLOVE) ×2 IMPLANT
GLOVE BIO SURGEON STRL SZ8 (GLOVE) ×6 IMPLANT
GLOVE BIO SURGEON STRL SZ8.5 (GLOVE) ×6 IMPLANT
GLOVE BIOGEL PI IND STRL 6.5 (GLOVE) IMPLANT
GLOVE BIOGEL PI INDICATOR 6.5 (GLOVE) ×2
GLOVE ECLIPSE 7.5 STRL STRAW (GLOVE) ×2 IMPLANT
GLOVE EXAM NITRILE XL STR (GLOVE) IMPLANT
GLOVE SURG SS PI 6.5 STRL IVOR (GLOVE) ×2 IMPLANT
GLOVE SURG SS PI 7.5 STRL IVOR (GLOVE) ×2 IMPLANT
GLOVE SURG SS PI 8.0 STRL IVOR (GLOVE) ×2 IMPLANT
GOWN STRL REUS W/ TWL LRG LVL3 (GOWN DISPOSABLE) IMPLANT
GOWN STRL REUS W/ TWL XL LVL3 (GOWN DISPOSABLE) ×2 IMPLANT
GOWN STRL REUS W/TWL 2XL LVL3 (GOWN DISPOSABLE) IMPLANT
GOWN STRL REUS W/TWL LRG LVL3 (GOWN DISPOSABLE) ×12
GOWN STRL REUS W/TWL XL LVL3 (GOWN DISPOSABLE) ×6
HEMOSTAT POWDER KIT SURGIFOAM (HEMOSTASIS) ×5 IMPLANT
KIT BASIN OR (CUSTOM PROCEDURE TRAY) ×3 IMPLANT
KIT INFUSE MEDIUM (Orthopedic Implant) ×2 IMPLANT
KIT POSITION SURG JACKSON T1 (MISCELLANEOUS) ×2 IMPLANT
KIT TURNOVER KIT B (KITS) ×3 IMPLANT
MILL MEDIUM DISP (BLADE) ×3 IMPLANT
NDL HYPO 21X1.5 SAFETY (NEEDLE) IMPLANT
NEEDLE HYPO 21X1.5 SAFETY (NEEDLE) ×3 IMPLANT
NEEDLE HYPO 22GX1.5 SAFETY (NEEDLE) ×3 IMPLANT
NS IRRIG 1000ML POUR BTL (IV SOLUTION) ×5 IMPLANT
OIL CARTRIDGE MAESTRO DRILL (MISCELLANEOUS) ×3
PACK LAMINECTOMY NEURO (CUSTOM PROCEDURE TRAY) ×3 IMPLANT
PAD ARMBOARD 7.5X6 YLW CONV (MISCELLANEOUS) ×3 IMPLANT
PATTIES SURGICAL .5 X.5 (GAUZE/BANDAGES/DRESSINGS) ×2 IMPLANT
PATTIES SURGICAL .5 X1 (DISPOSABLE) IMPLANT
PLATE BN 1.55-1.75XLO BAR (Cage) IMPLANT
PUTTY DBM 10CC CALC GRAN (Putty) ×4 IMPLANT
ROD L635 HEX END UNLINED (Rod) ×2 IMPLANT
SCREW PEDICLE VA L635 7.5X50M (Screw) ×16 IMPLANT
SCREW SET BREAK OFF (Screw) ×16 IMPLANT
SCREW SET ILIAC (Screw) ×8 IMPLANT
SLEEVE SURGICAL STRL (SLEEVE) ×2 IMPLANT
SPACER ALTERA 10X31 8-12MM-8 (Spacer) ×2 IMPLANT
SPONGE LAP 4X18 RFD (DISPOSABLE) IMPLANT
SPONGE NEURO XRAY DETECT 1X3 (DISPOSABLE) IMPLANT
SPONGE SURGIFOAM ABS GEL 100 (HEMOSTASIS) ×2 IMPLANT
STRIP CLOSURE SKIN 1/2X4 (GAUZE/BANDAGES/DRESSINGS) ×2 IMPLANT
SUT VIC AB 1 CT1 18XBRD ANBCTR (SUTURE) ×2 IMPLANT
SUT VIC AB 1 CT1 8-18 (SUTURE) ×15
SUT VIC AB 2-0 CP2 18 (SUTURE) ×10 IMPLANT
SYR 20ML LL LF (SYRINGE) ×2 IMPLANT
TOWEL GREEN STERILE (TOWEL DISPOSABLE) ×3 IMPLANT
TOWEL GREEN STERILE FF (TOWEL DISPOSABLE) ×3 IMPLANT
TRAY FOLEY MTR SLVR 16FR STAT (SET/KITS/TRAYS/PACK) ×3 IMPLANT
WATER STERILE IRR 1000ML POUR (IV SOLUTION) ×3 IMPLANT

## 2018-11-22 NOTE — Progress Notes (Signed)
Subjective: The patient is somnolent but arousable.  Objective: Vital signs in last 24 hours: Temp:  [97.9 F (36.6 C)-98.8 F (37.1 C)] 98.8 F (37.1 C) (09/28 1415) Pulse Rate:  [109-110] 109 (09/28 1425) Resp:  [20-27] 27 (09/28 1425) BP: (121-125)/(81-83) 121/81 (09/28 1415) SpO2:  [94 %-98 %] 98 % (09/28 1425) Estimated body mass index is 36.48 kg/m as calculated from the following:   Height as of 11/18/18: 6' (1.829 m).   Weight as of 11/18/18: 122 kg.   Intake/Output from previous day: No intake/output data recorded. Intake/Output this shift: Total I/O In: 2530 [I.V.:2400; Blood:130] Out: 1575 [Urine:1175; Blood:400]  Physical exam patient is somnolent but arousable.  He is moving extremities well.  Lab Results: No results for input(s): WBC, HGB, HCT, PLT in the last 72 hours. BMET No results for input(s): NA, K, CL, CO2, GLUCOSE, BUN, CREATININE, CALCIUM in the last 72 hours.  Studies/Results: Dg Thoracolumabar Spine  Result Date: 11/22/2018 CLINICAL DATA:  Surgery. EXAM: THORACOLUMBAR SPINE 1V COMPARISON:  None. FINDINGS: Pedicle rods and screws have been placed at multiple levels. The involved levels cannot be assessed on today's limited imaging. IMPRESSION: Hardware placement in the spine as above. The involved levels cannot be definitely assessed on this study. However, a disc spacer device is identified. A previous study on May 31, 2018 demonstrated a disc spacer device at L2-3. Using the disc spacer device on today's study as a landmark, I believe pedicle screws have been added at T11 and T12. Electronically Signed   By: Dorise Bullion III M.D   On: 11/22/2018 13:19   Dg C-arm 1-60 Min  Result Date: 11/22/2018 CLINICAL DATA:  Surgery. EXAM: THORACOLUMBAR SPINE 1V COMPARISON:  None. FINDINGS: Pedicle rods and screws have been placed at multiple levels. The involved levels cannot be assessed on today's limited imaging. IMPRESSION: Hardware placement in the spine as  above. The involved levels cannot be definitely assessed on this study. However, a disc spacer device is identified. A previous study on May 31, 2018 demonstrated a disc spacer device at L2-3. Using the disc spacer device on today's study as a landmark, I believe pedicle screws have been added at T11 and T12. Electronically Signed   By: Dorise Bullion III M.D   On: 11/22/2018 13:19    Assessment/Plan: Patient is doing well.  I spoke with his wife.  LOS: 0 days     Thomas Mcclure 11/22/2018, 2:36 PM

## 2018-11-22 NOTE — H&P (Signed)
Subjective: The patient is a 63 year old black male who is had multiple previous back surgeries with instrumentation and fusions.  He has developed recurrent back and leg pain.  He has failed medical management.  He was worked up with a lumbar myelo CT which demonstrated degenerative changes, a large hernia disc and severe stenosis at L1-2.  I discussed the various treatment options with him.  He has weighed the risks, benefits and alternatives surgery and decided to proceed with an extension of his lumbar fusion.  Past Medical History:  Diagnosis Date  . Baker's cyst    Left calf  . CAD in native artery, 07/15/15 PCI of RCA with DES 07/16/2015   a.   NSTEMI 5/17: LHC - pLAD 20, pLCx 20, OM1 30, pRCA 100, EF 45-50%>> PCI: 2.5 x 24 mm Promus DES to RCA  //  b.   Echo 5/17: mild LVH, EF 50-55%, no RWMA, mod RVE //  c. LHC 6/17: pLAD 20, pLCx 20, OM1 50, pRCA stent ok, EF 35-45% with mod sized inf wall and basal segment aneurysm  . Chronic back pain    "down my back, down my legs" (02/18/2017)  . Constipation   . GERD (gastroesophageal reflux disease)   . Hyperlipidemia   . Hypertension    Dr. [name] [phone]  . Ischemic cardiomyopathy    a. LV-gram at time of LHC in 6/17 with EF 35-45%  //  b. Echo 7/17: EF 45-50%, inferior HK, grade 1 diastolic dysfunction, mildly dilated aortic root, moderately reduced RVSF, mild RAE  . Myocardial infarction ([place]) 2017  . 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 Numbness and tingling of both lower extremities    "on the outside of both sides" (02/18/2017)  . Rheumatoid arthritis ([place])   . Type II diabetes mellitus ([place])    diet controlled    Past Surgical History:  Procedure Laterality Date  . BACK SURGERY     x3  . CARDIAC CATHETERIZATION N/A 07/15/2015   Procedure: Left Heart Cath and Coronary Angiography;  Surgeon: [name], MD;  Location: [facility];  Service: Cardiovascular;  Laterality: N/A;  . CARDIAC  CATHETERIZATION N/A 07/15/2015   Procedure: Coronary Stent Intervention;  Surgeon: [name], MD;  Location: [facility];  Service: Cardiovascular;  Laterality: N/A;  . CARDIAC CATHETERIZATION N/A 08/07/2015   Procedure: Left Heart Cath and Coronary Angiography;  Surgeon: [name], MD;  Location: [facility];  Service: Cardiovascular;  Laterality: N/A;  . CORONARY ANGIOPLASTY WITH STENT PLACEMENT  07/2015  . JOINT REPLACEMENT    . LEFT HEART CATH AND CORONARY ANGIOGRAPHY N/A 02/19/2017   Procedure: LEFT HEART CATH AND CORONARY ANGIOGRAPHY;  Surgeon: Martinique, Peter M, MD;  Location: [facility];  Service: Cardiovascular;  Laterality: N/A;  . LUMBAR SPINE SURGERY  03/2009  & 2012  . MASS EXCISION N/A 04/19/2012   Procedure: removal of posterior cervical lipoma;  Surgeon: [name], MD;  Location: [facility];  Service: Neurosurgery;  Laterality: N/A;  Removal of posterior cervical lipoma  . POPLITEAL SYNOVIAL CYST EXCISION Left    "opened up behind my knee; scraped out arthritis"  . SPINAL CORD STIMULATOR INSERTION N/A 01/07/2013   Procedure:  SPINAL CORD STIMULATOR INSERTION;  Surgeon: [name], MD;  Location: [facility];  Service: Neurosurgery;  Laterality: N/A;  . TONSILLECTOMY    . TOTAL HIP ARTHROPLASTY Right 10/03/2016  Procedure: TOTAL HIP ARTHROPLASTY;  Surgeon: [name], MD;  Location: [facility];  Service: Orthopedics;  Laterality: Right;  . WISDOM TOOTH EXTRACTION     hx of    Allergies  Allergen Reactions  . Brilinta [Ticagrelor] Shortness Of Breath and Other (See Comments)    CHEST PAIN   . Methylprednisolone Other (See Comments)    DIAPHORESIS, HYPOTENSION  . Prednisone Other (See Comments)    DIAPHORESIS, HYPOTENSION  . Toradol [Ketorolac Tromethamine] Other (See Comments)    DIAPHORESIS, HYPOTENSION    . Adhesive [Tape] Rash and Other (See Comments)    "blisters" ( NO SURGICAL TAPE, PLEASE)    Social History   Tobacco Use   . Smoking status: Former Smoker    Packs/day: 0.25    Years: 40.00    Pack years: 10.00    Types: Cigarettes  . Smokeless tobacco: Never Used  . Tobacco comment: 02/18/2017 "quit in 2012"  Substance Use Topics  . Alcohol use: No    Alcohol/week: 0.0 standard drinks    Family History  Problem Relation Age of Onset  . Heart disease Mother   . Prostate cancer Brother    Prior to Admission medications   Medication Sig Start Date End Date Taking? Authorizing Provider  acetaminophen (TYLENOL) 500 MG tablet Take 1,000 mg by mouth 2 (two) times daily as needed (pain.).   Yes [provider]  aspirin EC 81 MG tablet Take 1 tablet (81 mg total) by mouth daily. 07/18/15  Yes Burns, Alexa R, MD  atorvastatin (LIPITOR) 80 MG tablet Take 80 mg by mouth daily at 6 PM.   Yes [provider]  Buprenorphine HCl (BELBUCA) 300 MCG FILM Place 300 mcg inside cheek 2 (two) times daily.   Yes [provider]  chlorthalidone (HYGROTON) 25 MG tablet Take 1 tablet (25 mg total) by mouth daily. 05/27/16  Yes Burtis Junes, NP  cyclobenzaprine (FLEXERIL) 10 MG tablet TAKE 1 TABLET(10 MG) BY MOUTH DAILY AS NEEDED FOR MUSCLE SPASMS Patient taking differently: Take 10 mg by mouth See admin instructions. Take 1 tablet (10 mg) by mouth scheduled at bedtime, may take 1 tablet (10 mg) by mouth in the day if needed for leg cramps. 02/16/18  Yes Marrian Salvage, FNP  diclofenac sodium (VOLTAREN) 1 % GEL Apply 1 application topically 4 (four) times daily as needed (pain.).  10/04/18  Yes [provider]  DILT-XR 180 MG 24 hr capsule Take 180 mg by mouth daily.  04/09/16  Yes [provider]  DULoxetine (CYMBALTA) 60 MG capsule Take 1 capsule (60 mg total) by mouth daily. 11/06/17  Yes Marrian Salvage, FNP  glipiZIDE (GLUCOTROL) 5 MG tablet Take 5 mg by mouth daily before breakfast.    Yes [provider]  lisinopril (PRINIVIL,ZESTRIL) 20 MG tablet Take 20 mg  by mouth at bedtime.  07/07/16  Yes [provider]  lubiprostone (AMITIZA) 24 MCG capsule Take 48 mcg by mouth at bedtime.    Yes [provider]  metFORMIN (GLUCOPHAGE XR) 500 MG 24 hr tablet Take 2 tablets (1,000 mg total) by mouth 2 (two) times daily. 11/09/17  Yes Marrian Salvage, FNP  Multiple Vitamin (MULTIVITAMIN WITH MINERALS) TABS tablet Take 1 tablet by mouth daily.   Yes [provider]  omeprazole (PRILOSEC) 40 MG capsule Take 1 capsule (40 mg total) by mouth daily. 11/06/17  Yes Marrian Salvage, FNP  oxyCODONE (ROXICODONE) 15 MG immediate release tablet Take 15 mg by  mouth 4 (four) times daily as needed for pain.  10/11/18  Yes [provider]  polyethylene glycol powder (GLYCOLAX/MIRALAX) powder Take 17 g by mouth daily. Mix in 8 oz water, juice, soda, coffee or tea and drink Patient taking differently: Take 17 g by mouth 2 (two) times daily as needed (constipation.). Mix in 8 oz water, juice, soda, coffee or tea and drink 11/06/17  Yes Marrian Salvage, FNP  potassium chloride SA (K-DUR,KLOR-CON) 20 MEQ tablet Take 1 tablet (20 mEq total) by mouth daily. 05/24/18  Yes [name], MD  pregabalin (LYRICA) 200 MG capsule Take 1 capsule (200 mg total) by mouth 2 (two) times daily. 08/02/18  Yes Bayard Hugger, NP  QC LIDOCAINE PAIN RELIEF EX Place 1 patch onto the skin daily as needed (pain.).   Yes [provider]  senna-docusate (SENOKOT S) 8.6-50 MG tablet Take 1 tablet by mouth 2 (two) times daily. Patient taking differently: Take 3 tablets by mouth at bedtime.  05/31/17  Yes Eugenie Filler, MD  XTAMPZA ER 36 MG C12A Take 36 mg by mouth daily.  10/25/18  Yes [provider]  metoprolol tartrate (LOPRESSOR) 25 MG tablet Take 1 tablet (25 mg total) by mouth 2 (two) times daily. Patient not taking: Reported on 11/16/2018 10/06/18 01/04/19  Burtis Junes, NP  nitroGLYCERIN (NITROSTAT) 0.4 MG SL tablet Place 1 tablet  (0.4 mg total) under the tongue every 5 (five) minutes as needed for chest pain. 04/26/18   Isaiah Serge, NP  oxyCODONE ER [facility] ER) 9 MG C12A Take 1 capsule by mouth every 12 (twelve) hours. Do Not Fill Before 08/18/02020 Patient not taking: Reported on 11/16/2018 09/28/18   Bayard Hugger, NP     Review of Systems  Positive ROS: As above  All other systems have been reviewed and were otherwise negative with the exception of those mentioned in the HPI and as above.  Objective: Vital signs in last 24 hours: Temp:  [97.9 F (36.6 C)] 97.9 F (36.6 C) (09/28 0620) Resp:  [20] 20 (09/28 0620) BP: (125)/(83) 125/83 (09/28 0620) SpO2:  [96 %] 96 % (09/28 0620) Estimated body mass index is 36.48 kg/m as calculated from the following:   Height as of 11/18/18: 6' (1.829 m).   Weight as of 11/18/18: 122 kg.   General Appearance: Alert, obese Head: Normocephalic, without obvious abnormality, atraumatic Eyes: PERRL, conjunctiva/corneas clear, EOM's intact,    Ears: Normal  Throat: Normal  Neck: Supple, Back: The patient's lumbar incision is well-healed Lungs: Clear to auscultation bilaterally, respirations unlabored Heart: Regular rate and rhythm, no murmur, rub or gallop Abdomen: Soft, non-tender, obese Extremities: Extremities normal, atraumatic, no cyanosis or edema Skin: unremarkable  NEUROLOGIC:   Mental status: alert and oriented,Motor Exam - grossly normal Sensory Exam - grossly normal Reflexes:  Coordination - grossly normal Gait - grossly normal Balance - grossly normal Cranial Nerves: I: smell Not tested  II: visual acuity  OS: Normal  OD: Normal   II: visual fields Full to confrontation  II: pupils Equal, round, reactive to light  III,VII: ptosis None  III,IV,VI: extraocular muscles  Full ROM  V: mastication Normal  V: facial light touch sensation  Normal  V,VII: corneal reflex  Present  VII: facial muscle function - upper  Normal  VII: facial muscle function  - lower Normal  VIII: hearing Not tested  IX: soft palate elevation  Normal  IX,X: gag reflex Present  XI: trapezius strength  5/5  XI: sternocleidomastoid strength 5/5  XI: neck flexion strength  5/5  XII: tongue strength  Normal    Data Review Lab Results  Component Value Date   WBC 7.3 11/18/2018   HGB 12.3 (L) 11/18/2018   HCT 39.2 11/18/2018   MCV 83.9 11/18/2018   PLT 328 11/18/2018   Lab Results  Component Value Date   NA 136 11/18/2018   K 3.9 11/18/2018   CL 99 11/18/2018   CO2 25 11/18/2018   BUN 12 11/18/2018   CREATININE 0.97 11/18/2018   GLUCOSE 151 (H) 11/18/2018   Lab Results  Component Value Date   INR 0.96 02/18/2017    Assessment/Plan: Lumbar spinal stenosis, lumbar hernia disc, neurogenic claudication, lumbago: I have discussed the situation with the patient and his wife.  I have reviewed his imaging studies with him and pointed out the abnormalities.  We have discussed the various treatment options including surgery.  I have described the surgical treatment option of an exploration of his lumbar fusion with an L1-2 decompression and extending his instrumentation and fusion to T10.  I have shown him surgical models.  I have given him a surgical pamphlet.  We have discussed the risks, benefits, alternatives, expected postoperative course, and likelihood of achieving our goals with surgery.  I have answered all the patient's questions.  He has decided to proceed with surgery.   [name] 11/22/2018 7:24 AM

## 2018-11-22 NOTE — Transfer of Care (Signed)
Immediate Anesthesia Transfer of Care Note  Patient: CHRISTA MUNSHI  Procedure(s) Performed: Posterior Lateral and Interbody fusion - Lumbar one-Lumbar two; explore fusion, posterior instrumentation and fusion Thoracic ten to the ilium (N/A )  Patient Location: PACU  Anesthesia Type:General  Level of Consciousness: awake and drowsy  Airway & Oxygen Therapy: Patient Spontanous Breathing and Patient connected to face mask oxygen  Post-op Assessment: Report given to RN, Post -op Vital signs reviewed and stable and Patient moving all extremities X 4  Post vital signs: Reviewed and stable  Last Vitals:  Vitals Value Taken Time  BP 121/81 11/22/18 1412  Temp    Pulse 111 11/22/18 1416  Resp 19 11/22/18 1416  SpO2 98 % 11/22/18 1416  Vitals shown include unvalidated device data.  Last Pain:  Vitals:   11/22/18 0646  TempSrc:   PainSc: 0-No pain         Complications: No apparent anesthesia complications

## 2018-11-22 NOTE — Progress Notes (Signed)
NCM received consult : HH, medication, DME needs. Awaiting PT/OT evaluations, NCM to f/u with TOC needs. Whitman Hero RN,BSN,CM [phone]

## 2018-11-22 NOTE — Op Note (Signed)
Brief history: The patient is a 63 year old black male on whom I previously performed a lumbar fusion.  He has developed recurrent back, buttock and leg pain consistent with neurogenic claudication.  He has failed medical management.  He was worked up with a lumbar myelo CT which demonstrated severe stenosis at L1-2.  I discussed the various treatment options with him.  He has decided to proceed with surgery after weighing the risks, benefits and alternatives.  Preoperative diagnosis: L1-2 degenerative disc disease, spinal stenosis ; lumbago; lumbar radiculopathy; neurogenic claudication  Postoperative diagnosis: The same  Procedure: L1 to laminotomy/foraminotomies/medial facetectomy to decompress the bilateral L1 and L2 nerve roots(the work required to do this was in addition to the work required to do the posterior lumbar interbody fusion because of the patient's spinal stenosis, facet arthropathy. Etc. requiring a wide decompression of the nerve roots.);  L1-2 transforaminal lumbar interbody fusion with local morselized autograft bone, bone morphogenic protein soaked collagen sponges and Zimmer DBM; insertion of interbody prosthesis at L1 to (globus peek expandable interbody prosthesis); posterior segmental instrumentation from T10 to ilium with globus titanium pedicle screws and rods; posterior lateral arthrodesis at T10-11, T11-12, T12-L1, L1-2 with local morselized autograft bone, bone morphogenic protein soaked collagen sponges and Zimmer DBM; exploration of lumbar fusion  Surgeon: Dr. Earle Gell  Asst.: Arnetha Massy nurse practitioner  Anesthesia: Gen. endotracheal  Estimated blood loss: 400 cc  Drains: One prevertebral Hemovac drain  Complications: None  Description of procedure: The patient was brought to the operating room by the anesthesia team. General endotracheal anesthesia was induced. The patient was turned to the prone position on the Wilson frame. The patient's lumbosacral  region was then prepared with Betadine scrub and Betadine solution. Sterile drapes were applied.  I then injected the area to be incised with Marcaine with epinephrine solution. I then used the scalpel to make a linear midline incision over the T10-11, T11-12, T12-L1, L1-2, L2-3, L3-4, L4-5, L5-S1 interspace, incising through the old surgical scar. I then used electrocautery to perform a bilateral subperiosteal dissection exposing the spinous process and lamina of T10 to the sacrum, and exposing the old hardware from L2 to the ilium bilaterally.  We then inserted the cerebellar, weitlander, and Adson retractor to provide exposure.  I inspected the arthrodesis at L2-3, L3-4, L4-5 and L5-S1.  It appeared solid.  I began the decompression by using the high speed drill to perform laminotomies at L1 to. We then used the Kerrison punches to widen the laminotomy and removed the ligamentum flavum at L1-2. We used the Kerrison punches to remove the medial facets at L1-2. We performed wide foraminotomies about the bilateral L1 and L2 nerve roots completing the decompression.  We now turned our attention to the posterior lumbar interbody fusion. I used a scalpel to incise the intervertebral disc at L1-2. I then performed a partial intervertebral discectomy at L1-2 bilaterally using the pituitary forceps. We prepared the vertebral endplates at [id number] bilaterally for the fusion by removing the soft tissues with the curettes. We then used the trial spacers to pick the appropriate sized interbody prosthesis. We prefilled his prosthesis with a combination of local morselized autograft bone that we obtained during the decompression as well as Zimmer DBM. We inserted the prefilled prosthesis into the interspace at L1-1 the right, being careful to stay lateral to the dura, we then turned and expanded the prosthesis. There was a good snug fit of the prosthesis in the interspace. We then filled and the  remainder of the  intervertebral disc space with local morselized autograft bone, bone morphogenic protein soaked collagen sponges and Zimmer DBM. This completed the posterior lumbar interbody arthrodesis.  We now turned attention to the instrumentation. Under fluoroscopic guidance we cannulated the bilateral T10, T11, T12, L1 pedicles with the bone probe. We then removed the bone probe. We then tapped the pedicle with a 6.5 millimeter tap. We then removed the tap. We probed inside the tapped pedicle with a ball probe to rule out cortical breaches. We then inserted a 7.5 x 50 millimeter pedicle screw into the T10, T11, T12, and L1 pedicles bilaterally under fluoroscopic guidance. We then palpated along the medial aspect of the pedicles to rule out cortical breaches. There were none. The nerve roots were not injured.  We then cut and contoured a titanium rod.  We then connected the unilateral pedicle screws from T10-L1 bilaterally with the rod.  We connected to the patient's old rod using the connectors.   We then tightened the caps appropriately. This completed the instrumentation from T10 to the ilium bilaterally.  We now turned our attention to the posterior lateral arthrodesis at T10-11, T11-12, T12-L1, and L1-2. We used the high-speed drill to decorticate the remainder of the facets, pars, transverse process at T10-11, T11-12, T12-L1, and L1-2. We then applied a combination of local morselized autograft bone, bone morphogenic protein soaked collagen sponges, and Zimmer DBM over these decorticated posterior lateral structures. This completed the posterior lateral arthrodesis.  We then obtained hemostasis using bipolar electrocautery. We irrigated the wound out with bacitracin solution. We inspected the thecal sac and nerve roots and noted they were well decompressed. We then removed the retractor.  We placed a Hemovac drain in the prevertebral space and tunneled out through a separate stab wound.  We injected Exparel . We  reapproximated patient's thoracolumbar fascia with interrupted #1 Vicryl suture. We reapproximated patient's subcutaneous tissue with interrupted 2-0 Vicryl suture. The reapproximated patient's skin with Steri-Strips and benzoin. The wound was then coated with bacitracin ointment. A sterile dressing was applied. The drapes were removed. The patient was subsequently returned to the supine position where they were extubated by the anesthesia team. He was then transported to the post anesthesia care unit in stable condition. All sponge instrument and needle counts were reportedly correct at the end of this case.

## 2018-11-22 NOTE — Progress Notes (Signed)
Patient transferred to 3C09, receiving RN at bedside, pt in bathroom to void, notified family of patients room assignment.  Rowe Pavy, RN

## 2018-11-22 NOTE — Anesthesia Procedure Notes (Addendum)
Procedure Name: Intubation Date/Time: 11/22/2018 7:45 AM Performed by: Harden Mo, CRNA Pre-anesthesia Checklist: Patient identified, Emergency Drugs available, Suction available and Patient being monitored Patient Re-evaluated:Patient Re-evaluated prior to induction Oxygen Delivery Method: Circle System Utilized Preoxygenation: Pre-oxygenation with 100% oxygen Induction Type: IV induction Ventilation: Mask ventilation without difficulty and Oral airway inserted - appropriate to patient size Laryngoscope Size: Glidescope and 4 Grade View: Grade II Tube type: Oral Tube size: 7.5 mm Number of attempts: 2 Airway Equipment and Method: Stylet and Oral airway Placement Confirmation: ETT inserted through vocal cords under direct vision,  positive ETCO2 and breath sounds checked- equal and bilateral Secured at: 23 cm Tube secured with: Tape Dental Injury: Teeth and Oropharynx as per pre-operative assessment  Comments: DL x 1 with Sabra Heck 2 by CRNA. Light on laryngoscope handle kept going in and out. Removed blade, mask ventilated, and electively used glidescope to intubate patient.

## 2018-11-23 ENCOUNTER — Telehealth: Payer: Self-pay | Admitting: Interventional Cardiology

## 2018-11-23 ENCOUNTER — Ambulatory Visit: Payer: Medicare Other | Admitting: Interventional Cardiology

## 2018-11-23 LAB — CBC
HCT: 30.8 % — ABNORMAL LOW (ref 39.0–52.0)
Hemoglobin: 9.9 g/dL — ABNORMAL LOW (ref 13.0–17.0)
MCH: 26.6 pg (ref 26.0–34.0)
MCHC: 32.1 g/dL (ref 30.0–36.0)
MCV: 82.8 fL (ref 80.0–100.0)
Platelets: 259 10*3/uL (ref 150–400)
RBC: 3.72 MIL/uL — ABNORMAL LOW (ref 4.22–5.81)
RDW: 16.2 % — ABNORMAL HIGH (ref 11.5–15.5)
WBC: 13.1 10*3/uL — ABNORMAL HIGH (ref 4.0–10.5)
nRBC: 0 % (ref 0.0–0.2)

## 2018-11-23 LAB — BASIC METABOLIC PANEL
Anion gap: 12 (ref 5–15)
BUN: 17 mg/dL (ref 8–23)
CO2: 26 mmol/L (ref 22–32)
Calcium: 8.6 mg/dL — ABNORMAL LOW (ref 8.9–10.3)
Chloride: 100 mmol/L (ref 98–111)
Creatinine, Ser: 1.17 mg/dL (ref 0.61–1.24)
GFR calc Af Amer: >60 mL/min (ref >60)
GFR calc non Af Amer: >60 mL/min (ref >60)
Glucose, Bld: 202 mg/dL — ABNORMAL HIGH (ref 70–99)
Potassium: 3.8 mmol/L (ref 3.5–5.1)
Sodium: 138 mmol/L (ref 135–145)

## 2018-11-23 LAB — GLUCOSE, CAPILLARY
Glucose-Capillary: 231 mg/dL — ABNORMAL HIGH (ref 70–99)
Glucose-Capillary: 99 mg/dL (ref 70–99)
Glucose-Capillary: 126 mg/dL — ABNORMAL HIGH (ref 70–99)
Glucose-Capillary: 268 mg/dL — ABNORMAL HIGH (ref 70–99)

## 2018-11-23 MED ORDER — OXYCODONE HCL 5 MG PO TABS
15.0000 mg | ORAL_TABLET | ORAL | Status: DC | PRN
  Administered 2018-11-23 – 2018-11-26 (×14): 15 mg via ORAL
  Filled 2018-11-23 (×14): qty 3

## 2018-11-23 MED ORDER — HYDROMORPHONE HCL 1 MG/ML IJ SOLN
1.0000 mg | INTRAMUSCULAR | Status: DC | PRN
  Administered 2018-11-23 – 2018-11-26 (×18): 1 mg via INTRAVENOUS
  Filled 2018-11-23 (×18): qty 1

## 2018-11-23 MED ORDER — FERROUS SULFATE 325 (65 FE) MG PO TABS
325.0000 mg | ORAL_TABLET | Freq: Every day | ORAL | Status: DC
  Administered 2018-11-23 – 2018-11-26 (×4): 325 mg via ORAL
  Filled 2018-11-23 (×4): qty 1

## 2018-11-23 MED ORDER — SODIUM CHLORIDE 0.9 % IV SOLN
INTRAVENOUS | Status: AC
  Administered 2018-11-23: 22:00:00 via INTRAVENOUS

## 2018-11-23 MED ORDER — ALUM & MAG HYDROXIDE-SIMETH 200-200-20 MG/5ML PO SUSP
30.0000 mL | Freq: Four times a day (QID) | ORAL | Status: DC | PRN
  Administered 2018-11-23: 18:00:00 30 mL via ORAL
  Filled 2018-11-23: qty 30

## 2018-11-23 MED FILL — Gelatin Absorbable MT Powder: OROMUCOSAL | Qty: 1 | Status: AC

## 2018-11-23 MED FILL — Thrombin For Soln 5000 Unit: CUTANEOUS | Qty: 5000 | Status: AC

## 2018-11-23 NOTE — Evaluation (Signed)
Physical Therapy Evaluation Patient Details Name: Thomas Mcclure MRN: [id number] DOB: 1955/07/17 Today's Date: 11/23/2018   History of Present Illness  Pt is a 63 y/o male now s/p PLIF L1-2, explore fusion, posterior instrumentation and fusion T10-S1. PMHx includes HTN, chronic back pain, RA, DM, hx of previous spinal surgeries, hx of cardiac catheterization   Clinical Impression  Pt admitted with above diagnosis. At the time of PT eval pt was able to perform transfers and ambulation with gross min guard assist to min assist with RW for support. TLSO adjusted for optimal fit. Pt educated on precautions, brace application/wearing schedule, car transfer, and activity progression. Pt currently with functional limitations due to the deficits listed below (see PT Problem List). Pt will benefit from skilled PT to increase their independence and safety with mobility to allow discharge to the venue listed below.       Follow Up Recommendations No PT follow up;Supervision for mobility/OOB    Equipment Recommendations  None recommended by PT    Recommendations for Other Services       Precautions / Restrictions Precautions Precautions: Fall;Back Precaution Booklet Issued: Yes (comment) Precaution Comments: issued and reviewed Required Braces or Orthoses: Spinal Brace Spinal Brace: Thoracolumbosacral orthotic;Applied in sitting position(Adjusted for optimal fit) Restrictions Weight Bearing Restrictions: No      Mobility  Bed Mobility Overal bed mobility: Needs Assistance Bed Mobility: Rolling;Sidelying to Sit Rolling: Min guard Sidelying to sit: Min assist;HOB elevated       General bed mobility comments: pt unable to transition to sitting without HOB elevated, reports has adjustable bed at home; +use of bedrail, significant time/effort; VCs for use of log roll technique  Transfers Overall transfer level: Needs assistance Equipment used: Rolling walker (2 wheeled) Transfers: Sit  to/from Stand Sit to Stand: Min guard;Min assist         General transfer comment: heavy reliance on UEs to assist with pt preferring to pull up on RW; minguard - light minA for safety  Ambulation/Gait Ambulation/Gait assistance: Min guard Gait Distance (Feet): 200 Feet Assistive device: Rolling walker (2 wheeled) Gait Pattern/deviations: Step-through pattern;Decreased stride length;Trunk flexed Gait velocity: Decreased Gait velocity interpretation: <1.8 ft/sec, indicate of risk for recurrent falls General Gait Details: VC's for improved posture and closer walker proximity. Pt moving slow and guarded. Trunk generally flexed but pt reports it is improved from prior to surgery.   Stairs Stairs: Yes Stairs assistance: Min guard Stair Management: One rail Right;Step to pattern;Forwards Number of Stairs: 2 General stair comments: VC's for sequencing and general safety. Pt with HHA on the L to simulate home environment with 2 railings that he can reach at the same time.   Wheelchair Mobility    Modified Rankin (Stroke Patients Only)       Balance Overall balance assessment: Needs assistance Sitting-balance support: Feet supported Sitting balance-Leahy Scale: Good     Standing balance support: Bilateral upper extremity supported;During functional activity Standing balance-Leahy Scale: Poor Standing balance comment: overall reliant on UE support                             Pertinent Vitals/Pain Pain Assessment: Faces Faces Pain Scale: Hurts even more Pain Location: back, incisional Pain Descriptors / Indicators: Discomfort;Grimacing;Sore Pain Intervention(s): Monitored during session    Home Living Family/patient expects to be discharged to:: Private residence Living Arrangements: Spouse/significant other Available Help at Discharge: Family Type of Home: House Home Access: Stairs to enter  Entrance Stairs-Rails: Right;Left;Can reach both Entrance  Stairs-Number of Steps: 2 Home Layout: One level Home Equipment: Walker - 2 wheels;Cane - single point;Bedside commode      Prior Function Level of Independence: Independent with assistive device(s)         Comments: using RW/SPC for mobility     Hand Dominance   Dominant Hand: Right    Extremity/Trunk Assessment   Upper Extremity Assessment Upper Extremity Assessment: Overall WFL for tasks assessed    Lower Extremity Assessment Lower Extremity Assessment: Generalized weakness(Consistent with pre-op diagnosis)    Cervical / Trunk Assessment Cervical / Trunk Assessment: Other exceptions Cervical / Trunk Exceptions: s/p lumbar sx  Communication   Communication: No difficulties  Cognition Arousal/Alertness: Awake/alert Behavior During Therapy: WFL for tasks assessed/performed Overall Cognitive Status: Within Functional Limits for tasks assessed                                        General Comments      Exercises     Assessment/Plan    PT Assessment Patient needs continued PT services  PT Problem List Decreased strength;Decreased activity tolerance;Decreased balance;Decreased mobility;Decreased knowledge of use of DME;Decreased safety awareness;Decreased knowledge of precautions;Pain       PT Treatment Interventions DME instruction;Gait training;Functional mobility training;Therapeutic activities;Therapeutic exercise;Neuromuscular re-education;Patient/family education    PT Goals (Current goals can be found in the Care Plan section)  Acute Rehab PT Goals Patient Stated Goal: home today PT Goal Formulation: With patient Time For Goal Achievement: 11/30/18 Potential to Achieve Goals: Good    Frequency Min 5X/week   Barriers to discharge        Co-evaluation PT/OT/SLP Co-Evaluation/Treatment: Yes Reason for Co-Treatment: For patient/therapist safety;Other (comment)(History of aggressive behavior with nursing staff) PT goals addressed  during session: Mobility/safety with mobility;Balance;Proper use of DME         AM-PAC PT "6 Clicks" Mobility  Outcome Measure Help needed turning from your back to your side while in a flat bed without using bedrails?: None Help needed moving from lying on your back to sitting on the side of a flat bed without using bedrails?: A Little Help needed moving to and from a bed to a chair (including a wheelchair)?: A Little Help needed standing up from a chair using your arms (e.g., wheelchair or bedside chair)?: A Little Help needed to walk in hospital room?: A Little Help needed climbing 3-5 steps with a railing? : A Little 6 Click Score: 19    End of Session Equipment Utilized During Treatment: Gait belt Activity Tolerance: Patient tolerated treatment well Patient left: Other (comment)(Sitting EOB with OT present) Nurse Communication: Mobility status PT Visit Diagnosis: Unsteadiness on feet (R26.81);Pain    Time: [id number] PT Time Calculation (min) (ACUTE ONLY): 30 min   Charges:   PT Evaluation $PT Eval Moderate Complexity: 1 Mod          Rolinda Roan, PT, DPT Acute Rehabilitation Services Pager: [phone] Office: [phone]   Thelma Comp 11/23/2018, 1:23 PM

## 2018-11-23 NOTE — Telephone Encounter (Signed)
Thanks

## 2018-11-23 NOTE — Telephone Encounter (Signed)
New Message:   Wife wants Dr [name] to know that pt had back surgery yesterday. She said let Dr [name] know he is in [facility] 09

## 2018-11-23 NOTE — Progress Notes (Signed)
OT Evaluation  This 63 y/o male presents with the above. PTA pt reports mod independence with ADL and functional mobility. Pt currently requiring close minguard assist for functional mobility using RW; requires minA for seated UB ADL and modA for LB ADL given need for adherence to back precautions. Pt reports plans to return home with available assist from spouse for ADL PRN. Reviewed back precautions, brace management, safety and compensatory techniques for completing ADL and functional transfers while maintaining precautions with pt verbalizing understanding. He will benefit from continued acute OT services to maximize his safety and independence with ADL and mobility prior to return home. Will follow.    11/23/18 0800  OT Visit Information  Last OT Received On 11/23/18  Assistance Needed +1  PT/OT/SLP Co-Evaluation/Treatment Yes (overlap with PT)  Reason for Co-Treatment For patient/therapist safety  OT goals addressed during session ADL's and self-care  History of Present Illness Pt is a 63 y/o male now s/p PLIF L1-2, explore fusion, posterior instrumentation and fusion T10-S. PMHx includes HTN, chronic back pain, RA, DM, hx of previous spinal surgeries, hx of cardiac catheterization   Precautions  Precautions Fall;Back  Precaution Booklet Issued Yes (comment)  Precaution Comments issued and reviewed  Required Braces or Orthoses Spinal Brace  Spinal Brace TLSO;Applied in sitting position  Restrictions  Weight Bearing Restrictions No  Home Living  Family/patient expects to be discharged to: Private residence  Living Arrangements Spouse/significant other  Available Help at Discharge Family  Type of [place] unit  [place] - 2 wheels;Cane - single point;BSC  Prior Function  Level of Independence Independent with assistive device(s)  Comments using RW/SPC for mobility  Communication  Communication No  difficulties  Pain Assessment  Pain Assessment Faces  Faces Pain Scale 6  Pain Location back, incisional  Pain Descriptors / Indicators Discomfort;Grimacing;Sore  Pain Intervention(s) Monitored during session;Repositioned;Limited activity within patient's tolerance  Cognition  Arousal/Alertness Awake/alert  Behavior During Therapy WFL for tasks assessed/performed  Overall Cognitive Status Within Functional Limits for tasks assessed  Upper Extremity Assessment  Upper Extremity Assessment Overall WFL for tasks assessed  Lower Extremity Assessment  Lower Extremity Assessment Defer to PT evaluation  Cervical / Trunk Assessment  Cervical / Trunk Assessment Other exceptions  Cervical / Trunk Exceptions s/p lumbar sx  ADL  Overall ADL's  Needs assistance/impaired  Eating/Feeding Modified independent;Sitting  Grooming Wash/dry hands;Min guard;Standing  Upper Body Bathing Min guard;Set up;Sitting  Lower Body Bathing Moderate assistance;Sit to/from stand;Cueing for back precautions  Upper Body Dressing  Minimal assistance;Sitting  Upper Body Dressing Details (indicate cue type and reason) for brace management  Lower Body Dressing Moderate assistance;Sit to/from stand  Lower Body Dressing Details (indicate cue type and reason) due to need to adhere to back precautions, reports spouse can assist  Toilet Transfer Min guard;Ambulation;RW  Toilet Transfer Details (indicate cue type and reason) close minguard for safety  Toileting- Therapist, occupational;Sit to/from stand  Toileting - Clothing Manipulation Details (indicate cue type and reason) pt standing in bathroom to void bladder  Tub/Shower Transfer Details (indicate cue type and reason) reports plans to sponge bathe initially  Functional mobility during ADLs Min guard;Rolling walker  General ADL Comments initiated education of back precautions, safety and compensatory technique for completing ADL   Bed  Mobility  Overal bed mobility Needs Assistance  Bed Mobility Rolling;Sidelying to Sit  Rolling Min guard  Sidelying to sit Min assist;HOB elevated  General bed mobility comments pt unable to transition to sitting without HOB elevated, reports has adjustable bed at home; +use of bedrail, significant time/effort; VCs for use of log roll technique  Transfers  Overall transfer level Needs assistance  Equipment used Rolling walker (2 wheeled)  Transfers Sit to/from Stand  Sit to Stand Min guard;Min assist  General transfer comment heavy reliance on UEs to assist with pt preferring to pull up on RW; minguard - light minA for safety  Balance  Overall balance assessment Needs assistance  Sitting-balance support Feet supported  Sitting balance-Leahy Scale Good  Standing balance support Bilateral upper extremity supported;During functional activity  Standing balance-Leahy Scale Poor  Standing balance comment overall reliant on UE support  OT - End of Session  Equipment Utilized During Treatment Gait belt;Rolling walker;Back brace  Activity Tolerance Patient tolerated treatment well  Patient left in chair;with call bell/phone within reach  Nurse Communication Mobility status  OT Assessment  OT Recommendation/Assessment Patient needs continued OT Services  OT Visit Diagnosis Unsteadiness on feet (R26.81);Other abnormalities of gait and mobility (R26.89);Pain  Pain - part of body  (back)  OT Problem List Decreased strength;Decreased range of motion;Decreased activity tolerance;Impaired balance (sitting and/or standing);Decreased safety awareness;Decreased knowledge of use of DME or AE;Decreased knowledge of precautions;Pain;Obesity  OT Plan  OT Frequency (ACUTE ONLY) Min 2X/week  OT Treatment/Interventions (ACUTE ONLY) Self-care/ADL training;Therapeutic exercise;Neuromuscular education;DME and/or AE instruction;Therapeutic activities;Patient/family education;Balance training  AM-PAC OT "6 Clicks"  Daily Activity Outcome Measure (Version 2)  Help from another person eating meals? 4  Help from another person taking care of personal grooming? 3  Help from another person toileting, which includes using toliet, bedpan, or urinal? 3  Help from another person bathing (including washing, rinsing, drying)? 3  Help from another person to put on and taking off regular upper body clothing? 3  Help from another person to put on and taking off regular lower body clothing? 2  6 Click Score 18  OT Recommendation  Follow Up Recommendations No OT follow up;Supervision/Assistance - 24 hour  OT Equipment None recommended by OT (pt's DME needs are met)  Individuals Consulted  Consulted and Agree with Results and Recommendations Patient  Acute Rehab OT Goals  Patient Stated Goal home today  OT Goal Formulation With patient  Time For Goal Achievement 12/07/18  Potential to Achieve Goals Good  OT Time Calculation  OT Start Time (ACUTE ONLY) 6759  OT Stop Time (ACUTE ONLY) 0905  OT Time Calculation (min) 37 min  OT General Charges  $OT Visit 1 Visit  OT Evaluation  $OT Eval Moderate Complexity 1 Mod    Lou Cal, Anton Ruiz Pager [phone] Office [phone]

## 2018-11-23 NOTE — Progress Notes (Signed)
Subjective: The patient is alert and pleasant.  He walked the hallways last night.  He feels he is walking much better already.  He is appropriately sore.  Objective: Vital signs in last 24 hours: Temp:  [97.5 F (36.4 C)-99.2 F (37.3 C)] 97.5 F (36.4 C) (09/29 0802) Pulse Rate:  [107-127] 122 (09/29 0802) Resp:  [11-27] 20 (09/29 0802) BP: (97-143)/(69-91) 109/74 (09/29 0802) SpO2:  [91 %-100 %] 100 % (09/29 0802) Estimated body mass index is 36.48 kg/m as calculated from the following:   Height as of 11/18/18: 6' (1.829 m).   Weight as of 11/18/18: 122 kg.   Intake/Output from previous day: 09/28 0701 - 09/29 0700 In: 2533 [I.V.:2403; Blood:130] Out: 2110 [Urine:1175; Drains:535; Blood:400] Intake/Output this shift: No intake/output data recorded.  Physical exam the patient is alert and oriented.  His lower extremity strength is normal.  Lab Results: Recent Labs    11/23/18 0553  WBC 13.1*  HGB 9.9*  HCT 30.8*  PLT 259   BMET Recent Labs    11/23/18 0553  NA 138  K 3.8  CL 100  CO2 26  GLUCOSE 202*  BUN 17  CREATININE 1.17  CALCIUM 8.6*    Studies/Results: Dg Thoracolumabar Spine  Result Date: 11/22/2018 CLINICAL DATA:  Surgery. EXAM: THORACOLUMBAR SPINE 1V COMPARISON:  None. FINDINGS: Pedicle rods and screws have been placed at multiple levels. The involved levels cannot be assessed on today's limited imaging. IMPRESSION: Hardware placement in the spine as above. The involved levels cannot be definitely assessed on this study. However, a disc spacer device is identified. A previous study on May 31, 2018 demonstrated a disc spacer device at L2-3. Using the disc spacer device on today's study as a landmark, I believe pedicle screws have been added at T11 and T12. Electronically Signed   By: Dorise Bullion III M.D   On: 11/22/2018 13:19   Dg C-arm 1-60 Min  Result Date: 11/22/2018 CLINICAL DATA:  Surgery. EXAM: THORACOLUMBAR SPINE 1V COMPARISON:  None.  FINDINGS: Pedicle rods and screws have been placed at multiple levels. The involved levels cannot be assessed on today's limited imaging. IMPRESSION: Hardware placement in the spine as above. The involved levels cannot be definitely assessed on this study. However, a disc spacer device is identified. A previous study on May 31, 2018 demonstrated a disc spacer device at L2-3. Using the disc spacer device on today's study as a landmark, I believe pedicle screws have been added at T11 and T12. Electronically Signed   By: Dorise Bullion III M.D   On: 11/22/2018 13:19    Assessment/Plan: Postop day #1: The patient is doing well.  We will mobilize him with PT and OT.  He will likely go home in a day or 2.  His drain is putting out quite a bit so we will continue it until tomorrow.  Acute blood loss anemia: His postoperative hemoglobin is 9.9.  He is not symptomatic.  I will start iron  LOS: 1 day     Thomas Mcclure 11/23/2018, 8:39 AM     Patient ID: Thomas Mcclure, male   DOB: 05/02/1955, 63 y.o.   MRN: [id number]

## 2018-11-23 NOTE — Progress Notes (Signed)
   Providing Compassionate, Quality Care - Together  Patient is tachycardic and with a moderate amount of sanguinous drainage in the Hemovac. Transfer order placed to a Med/Surg unit with cardiac monitoring. NS infusion started.  Viona Gilmore, DNP, AGNP-C Nurse Practitioner 11/23/2018 11:53 AM   [facility] [place] [address], [address], [place], [place] [address] P: [phone]    F: [phone]

## 2018-11-23 NOTE — Anesthesia Postprocedure Evaluation (Signed)
Anesthesia Post Note  Patient: Thomas Mcclure  Procedure(s) Performed: Posterior Lateral and Interbody fusion - Lumbar one-Lumbar two; explore fusion, posterior instrumentation and fusion Thoracic ten to the ilium (N/A )     Patient location during evaluation: PACU Anesthesia Type: General Level of consciousness: awake Pain management: pain level controlled Vital Signs Assessment: post-procedure vital signs reviewed and stable Respiratory status: spontaneous breathing Cardiovascular status: stable Postop Assessment: no apparent nausea or vomiting Anesthetic complications: no    Last Vitals:  Vitals:   11/23/18 0337 11/23/18 0802  BP: 109/74 109/74  Pulse: (!) 127 (!) 122  Resp: 18 20  Temp: 37.1 C (!) 36.4 C  SpO2: 93% 100%    Last Pain:  Vitals:   11/23/18 0919  TempSrc:   PainSc: 10-Worst pain ever                 Tamanna Whitson

## 2018-11-23 NOTE — Progress Notes (Signed)
Pt arrived to unit at 1900. Oriented to staff and unit. Assisted to bed and repositioned for comfort. Drains and lines checked for safety. IVF continue per orders. Telemetry connected x 2 nurse. Report given to oncoming nurse.

## 2018-11-24 ENCOUNTER — Encounter (HOSPITAL_COMMUNITY): Payer: Self-pay | Admitting: Neurosurgery

## 2018-11-24 LAB — CBC
HCT: 29.7 % — ABNORMAL LOW (ref 39.0–52.0)
Hemoglobin: 9.9 g/dL — ABNORMAL LOW (ref 13.0–17.0)
MCH: 27.3 pg (ref 26.0–34.0)
MCHC: 33.3 g/dL (ref 30.0–36.0)
MCV: 82 fL (ref 80.0–100.0)
Platelets: 232 10*3/uL (ref 150–400)
RBC: 3.62 MIL/uL — ABNORMAL LOW (ref 4.22–5.81)
RDW: 16.3 % — ABNORMAL HIGH (ref 11.5–15.5)
WBC: 13.6 10*3/uL — ABNORMAL HIGH (ref 4.0–10.5)
nRBC: 0 % (ref 0.0–0.2)

## 2018-11-24 LAB — GLUCOSE, CAPILLARY
Glucose-Capillary: 162 mg/dL — ABNORMAL HIGH (ref 70–99)
Glucose-Capillary: 142 mg/dL — ABNORMAL HIGH (ref 70–99)
Glucose-Capillary: 127 mg/dL — ABNORMAL HIGH (ref 70–99)
Glucose-Capillary: 136 mg/dL — ABNORMAL HIGH (ref 70–99)

## 2018-11-24 NOTE — Progress Notes (Signed)
Occupational Therapy Treatment Patient Details Name: Thomas Mcclure MRN: [id number] DOB: 03/30/1955 Today's Date: 11/24/2018    History of present illness Pt is a 63 y/o male now s/p PLIF L1-2, explore fusion, posterior instrumentation and fusion T10-S1. PMHx includes HTN, chronic back pain, RA, DM, hx of previous spinal surgeries, hx of cardiac catheterization    OT comments  Pt currently requires min cues for bed mobility with min (A). Pt reports I just want to be able to roll over by myself and get up by myself before I leave. Pt requires increase time but able to progress toward goals this session.   Follow Up Recommendations  No OT follow up;Supervision/Assistance - 24 hour    Equipment Recommendations  None recommended by OT    Recommendations for Other Services      Precautions / Restrictions Precautions Precautions: Fall;Back Precaution Booklet Issued: Yes (comment) Precaution Comments: Required cues to remember no twisting and no lifting precautions.  Required Braces or Orthoses: Spinal Brace Spinal Brace: Thoracolumbosacral orthotic;Applied in sitting position Restrictions Weight Bearing Restrictions: No       Mobility Bed Mobility Overal bed mobility: Needs Assistance Bed Mobility: Rolling;Sidelying to Sit Rolling: Min guard Sidelying to sit: Min assist;HOB elevated;+2 for safety/equipment       General bed mobility comments: Required increased time to perform bed mobility tasks. Heavy reliance on bed rails. Required min A for trunk elevation. Cues for log roll technique.   Transfers Overall transfer level: Needs assistance Equipment used: Rolling walker (2 wheeled) Transfers: Sit to/from Stand Sit to Stand: Min assist;+2 safety/equipment;From elevated surface         General transfer comment: Pt pulling up on RW and required PT/OT to brace RW. Min A for lift assist.     Balance Overall balance assessment: Needs assistance Sitting-balance support:  Feet supported Sitting balance-Leahy Scale: Good     Standing balance support: Bilateral upper extremity supported;During functional activity Standing balance-Leahy Scale: Poor Standing balance comment: overall reliant on UE support                           ADL either performed or assessed with clinical judgement   ADL Overall ADL's : Needs assistance/impaired Eating/Feeding: Independent   Grooming: Modified independent   Upper Body Bathing: Supervision/ safety;Sitting   Lower Body Bathing: Moderate assistance;Sit to/from stand           Toilet Transfer: Supervision/safety;RW             General ADL Comments: wife helps iwth LOB dressing     Vision       Perception     Praxis      Cognition Arousal/Alertness: Awake/alert Behavior During Therapy: WFL for tasks assessed/performed Overall Cognitive Status: Within Functional Limits for tasks assessed                                          Exercises     Shoulder Instructions       General Comments educated on washing     Pertinent Vitals/ Pain       Pain Assessment: Faces Faces Pain Scale: Hurts even more Pain Location: back, incisional Pain Descriptors / Indicators: Discomfort;Grimacing;Sore Pain Intervention(s): Monitored during session;Repositioned  Home Living  Prior Functioning/Environment              Frequency  Min 2X/week        Progress Toward Goals  OT Goals(current goals can now be found in the care plan section)  Progress towards OT goals: Progressing toward goals  Acute Rehab OT Goals Patient Stated Goal: to go home OT Goal Formulation: With patient Time For Goal Achievement: 12/07/18 Potential to Achieve Goals: Good ADL Goals Pt Will Perform Grooming: with modified independence;standing Pt Will Perform Lower Body Bathing: with supervision;sitting/lateral leans;sit to/from  stand Pt Will Perform Upper Body Dressing: with modified independence;with set-up;sitting Pt Will Perform Lower Body Dressing: with supervision;with adaptive equipment;sit to/from stand;with caregiver independent in assisting Pt Will Transfer to Toilet: with modified independence;ambulating Pt Will Perform Toileting - Clothing Manipulation and hygiene: with modified independence;sit to/from stand Additional ADL Goal #1: Pt will perform bed mobility at mod independent level as precursor to EOB/OOB ADL.  Plan Discharge plan remains appropriate    Co-evaluation    PT/OT/SLP Co-Evaluation/Treatment: Yes Reason for Co-Treatment: Necessary to address cognition/behavior during functional activity;For patient/therapist safety;To address functional/ADL transfers PT goals addressed during session: Mobility/safety with mobility;Balance;Proper use of DME OT goals addressed during session: ADL's and self-care;Proper use of Adaptive equipment and DME;Strengthening/ROM      AM-PAC OT "6 Clicks" Daily Activity     Outcome Measure   Help from another person eating meals?: None Help from another person taking care of personal grooming?: A Little Help from another person toileting, which includes using toliet, bedpan, or urinal?: A Little Help from another person bathing (including washing, rinsing, drying)?: A Little Help from another person to put on and taking off regular upper body clothing?: A Little Help from another person to put on and taking off regular lower body clothing?: A Lot 6 Click Score: 18    End of Session Equipment Utilized During Treatment: Gait belt;Rolling walker;Back brace  OT Visit Diagnosis: Unsteadiness on feet (R26.81);Other abnormalities of gait and mobility (R26.89);Pain   Activity Tolerance Patient tolerated treatment well   Patient Left in chair;with call bell/phone within reach   Nurse Communication Mobility status        Time: [id number] OT Time Calculation  (min): 24 min  Charges: OT General Charges $OT Visit: 1 Visit OT Treatments $Self Care/Home Management : 8-22 mins   Jeri Modena, OTR/L  Acute Rehabilitation Services Pager: [phone] Office: [phone] .    Jeri Modena 11/24/2018, 5:16 PM

## 2018-11-24 NOTE — Progress Notes (Signed)
Physical Therapy Treatment Patient Details Name: Thomas Mcclure MRN: [id number] DOB: [birth year] Today's Date: 11/24/2018    History of Present Illness Pt is a 63 y/o male now s/p PLIF L1-2, explore fusion, posterior instrumentation and fusion T10-S1. PMHx includes HTN, chronic back pain, RA, DM, hx of previous spinal surgeries, hx of cardiac catheterization     PT Comments    Pt progressing towards goals, however, remains limited secondary to pain. Saw with OT secondary to pain control issues throughout the day. Pt requiring min to min guard A for mobility using RW and had chair follow for increased safety. Reviewed back precautions and generalized walking program. Will continue to follow acutely to maximize functional mobility independence and safety.     Follow Up Recommendations  No PT follow up;Supervision for mobility/OOB     Equipment Recommendations  None recommended by PT    Recommendations for Other Services       Precautions / Restrictions Precautions Precautions: Fall;Back Precaution Booklet Issued: Yes (comment) Precaution Comments: Required cues to remember no twisting and no lifting precautions.  Required Braces or Orthoses: Spinal Brace Spinal Brace: Thoracolumbosacral orthotic;Applied in sitting position Restrictions Weight Bearing Restrictions: No    Mobility  Bed Mobility Overal bed mobility: Needs Assistance Bed Mobility: Rolling;Sidelying to Sit Rolling: Min guard Sidelying to sit: Min assist;HOB elevated;+2 for safety/equipment       General bed mobility comments: Required increased time to perform bed mobility tasks. Heavy reliance on bed rails. Required min A for trunk elevation. Cues for log roll technique.   Transfers Overall transfer level: Needs assistance Equipment used: Rolling walker (2 wheeled) Transfers: Sit to/from Stand Sit to Stand: Min assist;+2 safety/equipment;From elevated surface         General transfer comment: Pt pulling  up on RW and required PT/OT to brace RW. Min A for lift assist.   Ambulation/Gait Ambulation/Gait assistance: Min guard;+2 safety/equipment(chair follow ) Gait Distance (Feet): 150 Feet Assistive device: Rolling walker (2 wheeled) Gait Pattern/deviations: Step-through pattern;Decreased stride length;Trunk flexed;Wide base of support Gait velocity: Decreased   General Gait Details: Cues for upright posture throughout gait. Pt with wide BOS. Chair follow for safety as pt with increased pain.    Stairs             Wheelchair Mobility    Modified Rankin (Stroke Patients Only)       Balance Overall balance assessment: Needs assistance Sitting-balance support: Feet supported Sitting balance-Leahy Scale: Good     Standing balance support: Bilateral upper extremity supported;During functional activity Standing balance-Leahy Scale: Poor Standing balance comment: overall reliant on UE support                            Cognition Arousal/Alertness: Awake/alert Behavior During Therapy: WFL for tasks assessed/performed Overall Cognitive Status: Within Functional Limits for tasks assessed                                        Exercises      General Comments General comments (skin integrity, edema, etc.): Educated about generalized walking program.       Pertinent Vitals/Pain Pain Assessment: Faces Faces Pain Scale: Hurts even more Pain Location: back, incisional Pain Descriptors / Indicators: Discomfort;Grimacing;Sore Pain Intervention(s): Limited activity within patient's tolerance;Monitored during session;Repositioned    Home Living  Prior Function            PT Goals (current goals can now be found in the care plan section) Acute Rehab PT Goals Patient Stated Goal: to go home PT Goal Formulation: With patient Time For Goal Achievement: 11/30/18 Potential to Achieve Goals: Good Progress towards PT  goals: Progressing toward goals    Frequency    Min 5X/week      PT Plan Current plan remains appropriate    Co-evaluation PT/OT/SLP Co-Evaluation/Treatment: Yes Reason for Co-Treatment: Other (comment);For patient/therapist safety(pt with low mobility tolerance) PT goals addressed during session: Mobility/safety with mobility;Balance;Proper use of DME        AM-PAC PT "6 Clicks" Mobility   Outcome Measure  Help needed turning from your back to your side while in a flat bed without using bedrails?: A Little Help needed moving from lying on your back to sitting on the side of a flat bed without using bedrails?: A Little Help needed moving to and from a bed to a chair (including a wheelchair)?: A Little Help needed standing up from a chair using your arms (e.g., wheelchair or bedside chair)?: A Little Help needed to walk in hospital room?: A Little Help needed climbing 3-5 steps with a railing? : A Little 6 Click Score: 18    End of Session Equipment Utilized During Treatment: Gait belt;Back brace Activity Tolerance: Patient tolerated treatment well Patient left: in chair;with call bell/phone within reach Nurse Communication: Mobility status PT Visit Diagnosis: Unsteadiness on feet (R26.81);Pain Pain - part of body: (back)     Time: [id number] PT Time Calculation (min) (ACUTE ONLY): 24 min  Charges:  $Gait Training: 8-22 mins                     [name], PT, DPT  Acute Rehabilitation Services  Pager: [phone] Office: [phone]    [name] 11/24/2018, 4:39 PM

## 2018-11-24 NOTE — Progress Notes (Signed)
PT Cancellation Note  Patient Details Name: Thomas Mcclure MRN: [id number] DOB: 10-04-1955   Cancelled Treatment:    Reason Eval/Treat Not Completed: Pain limiting ability to participate Pt adamantly refusing to participate secondary to increased pain. Will reattempt as schedule allows.   [name], PT, DPT  Acute Rehabilitation Services  Pager: [phone] Office: [phone]    [name] 11/24/2018, 10:33 AM

## 2018-11-24 NOTE — Progress Notes (Signed)
Subjective: The patient is alert and pleasant.  He complains he is not getting adequate pain management with his current medical regimen.  He complains of back pain.  Objective: Vital signs in last 24 hours: Temp:  [98.1 F (36.7 C)-100.7 F (38.2 C)] 98.1 F (36.7 C) (09/30 0403) Pulse Rate:  [123-132] 123 (09/30 0403) Resp:  [14-20] 15 (09/30 0700) BP: (106-135)/(76-83) 117/83 (09/30 0403) SpO2:  [88 %-94 %] 92 % (09/30 0403) Weight:  [id number] kg] 122 kg (09/29 1921) Estimated body mass index is 36.48 kg/m as calculated from the following:   Height as of this encounter: 6' (1.829 m).   Weight as of this encounter: 122 kg.   Intake/Output from previous day: 09/29 0701 - 09/30 0700 In: 1380 [P.O.:480; I.V.:900] Out: 870 [Urine:550; Drains:320] Intake/Output this shift: No intake/output data recorded.  Physical exam the patient is alert and oriented.  He is moving his lower extremities well.  Lab Results: Recent Labs    11/23/18 0553 11/24/18 0409  WBC 13.1* 13.6*  HGB 9.9* 9.9*  HCT 30.8* 29.7*  PLT 259 232   BMET Recent Labs    11/23/18 0553  NA 138  K 3.8  CL 100  CO2 26  GLUCOSE 202*  BUN 17  CREATININE 1.17  CALCIUM 8.6*    Studies/Results: Dg Thoracolumabar Spine  Result Date: 11/22/2018 CLINICAL DATA:  Surgery. EXAM: THORACOLUMBAR SPINE 1V COMPARISON:  None. FINDINGS: Pedicle rods and screws have been placed at multiple levels. The involved levels cannot be assessed on today's limited imaging. IMPRESSION: Hardware placement in the spine as above. The involved levels cannot be definitely assessed on this study. However, a disc spacer device is identified. A previous study on May 31, 2018 demonstrated a disc spacer device at L2-3. Using the disc spacer device on today's study as a landmark, I believe pedicle screws have been added at T11 and T12. Electronically Signed   By: Dorise Bullion III M.D   On: 11/22/2018 13:19   Dg C-arm 1-60 Min  Result Date:  11/22/2018 CLINICAL DATA:  Surgery. EXAM: THORACOLUMBAR SPINE 1V COMPARISON:  None. FINDINGS: Pedicle rods and screws have been placed at multiple levels. The involved levels cannot be assessed on today's limited imaging. IMPRESSION: Hardware placement in the spine as above. The involved levels cannot be definitely assessed on this study. However, a disc spacer device is identified. A previous study on May 31, 2018 demonstrated a disc spacer device at L2-3. Using the disc spacer device on today's study as a landmark, I believe pedicle screws have been added at T11 and T12. Electronically Signed   By: Dorise Bullion III M.D   On: 11/22/2018 13:19    Assessment/Plan: Postop day #2: I have discussed the situation with the patient.  He is on quite a bit of pain medications including OxyContin 40 twice daily and oxycodone 15 mg every 3 hours as needed with IV Dilaudid for breakthrough pain.  We have discussed pain management and issues including opioid tolerance with chronic use, ceiling effect of opioids, and realistic expectations of pain management with opioids.  We will continue with the current regimen.  I have encouraged him to transition over to oral pain medications and limit the IV Dilaudid.  I have answered all his questions.  Hopefully today will be his "home today" and we can discharge him tomorrow.  LOS: 2 days     [name] 11/24/2018, 8:02 AM     Patient ID: Thomas Mcclure,  male   DOB: [birth year], 63 y.o.   MRN: [id number]

## 2018-11-25 LAB — GLUCOSE, CAPILLARY
Glucose-Capillary: 189 mg/dL — ABNORMAL HIGH (ref 70–99)
Glucose-Capillary: 170 mg/dL — ABNORMAL HIGH (ref 70–99)
Glucose-Capillary: 127 mg/dL — ABNORMAL HIGH (ref 70–99)
Glucose-Capillary: 135 mg/dL — ABNORMAL HIGH (ref 70–99)

## 2018-11-25 MED FILL — Heparin Sodium (Porcine) Inj 1000 Unit/ML: INTRAMUSCULAR | Qty: 30 | Status: AC

## 2018-11-25 MED FILL — Sodium Chloride IV Soln 0.9%: INTRAVENOUS | Qty: 2000 | Status: AC

## 2018-11-25 NOTE — Anesthesia Postprocedure Evaluation (Signed)
Anesthesia Post Note  Patient: Thomas Mcclure  Procedure(s) Performed: Posterior Lateral and Interbody fusion - Lumbar one-Lumbar two; explore fusion, posterior instrumentation and fusion Thoracic ten to the ilium (N/A )     Patient location during evaluation: PACU Anesthesia Type: General Level of consciousness: awake Pain management: pain level controlled Vital Signs Assessment: post-procedure vital signs reviewed and stable Respiratory status: spontaneous breathing Cardiovascular status: stable Postop Assessment: no apparent nausea or vomiting Anesthetic complications: no    Last Vitals:  Vitals:   11/25/18 0916 11/25/18 1311  BP: 123/83 121/78  Pulse: (!) 119 (!) 118  Resp: 17 17  Temp: 36.8 C 37 C  SpO2: 94% 92%    Last Pain:  Vitals:   11/25/18 1531  TempSrc:   PainSc: 9                  Xiadani Damman

## 2018-11-25 NOTE — Anesthesia Postprocedure Evaluation (Signed)
Anesthesia Post Note  Patient: Thomas Mcclure  Procedure(s) Performed: Posterior Lateral and Interbody fusion - Lumbar one-Lumbar two; explore fusion, posterior instrumentation and fusion Thoracic ten to the ilium (N/A )     Patient location during evaluation: PACU Anesthesia Type: General Level of consciousness: awake Pain management: pain level controlled Vital Signs Assessment: post-procedure vital signs reviewed and stable Respiratory status: spontaneous breathing Cardiovascular status: stable Postop Assessment: no apparent nausea or vomiting Anesthetic complications: no    Last Vitals:  Vitals:   11/25/18 0916 11/25/18 1311  BP: 123/83 121/78  Pulse: (!) 119 (!) 118  Resp: 17 17  Temp: 36.8 C 37 C  SpO2: 94% 92%    Last Pain:  Vitals:   11/25/18 1531  TempSrc:   PainSc: 9                  Jeryl Umholtz

## 2018-11-25 NOTE — Progress Notes (Signed)
Physical Therapy Treatment Patient Details Name: Thomas Mcclure MRN: [id number] DOB: Jun 26, 1955 Today's Date: 11/25/2018    History of Present Illness Pt is a 63 y/o male now s/p PLIF L1-2, explore fusion, posterior instrumentation and fusion T10-S1. PMHx includes HTN, chronic back pain, RA, DM, hx of previous spinal surgeries, hx of cardiac catheterization     PT Comments    Patient seen for mobility progression. Pt up in recliner upon arrival and reports 7/10. Pt is motivated to participate in therapy. Min A required to stand from recliner and min guard for gait training distance of 150 ft. TLSO readjusted and pt educated in donning/doffing properly. Continue to progress as tolerated.     Follow Up Recommendations  No PT follow up;Supervision for mobility/OOB     Equipment Recommendations  None recommended by PT    Recommendations for Other Services       Precautions / Restrictions Precautions Precautions: Fall;Back Precaution Comments: cues needed to maintain precautions while mobilizing Required Braces or Orthoses: Spinal Brace Spinal Brace: Thoracolumbosacral orthotic;Applied in sitting position Restrictions Weight Bearing Restrictions: No    Mobility  Bed Mobility Overal bed mobility: Needs Assistance Bed Mobility: Sit to Sidelying;Rolling Rolling: Min guard       Sit to sidelying: Min guard General bed mobility comments: cues for log roll   Transfers Overall transfer level: Needs assistance Equipment used: Rolling walker (2 wheeled) Transfers: Sit to/from Stand Sit to Stand: Min assist         General transfer comment: assist to power up into standing  Ambulation/Gait Ambulation/Gait assistance: Min guard Gait Distance (Feet): 150 Feet Assistive device: Rolling walker (2 wheeled) Gait Pattern/deviations: Step-through pattern;Decreased stride length;Trunk flexed;Wide base of support Gait velocity: Decreased   General Gait Details: cues for upright  posture and maintaining safe proximity to [employer]             Wheelchair Mobility    Modified Rankin (Stroke Patients Only)       Balance Overall balance assessment: Needs assistance Sitting-balance support: Feet supported Sitting balance-Leahy Scale: Good     Standing balance support: Bilateral upper extremity supported;During functional activity Standing balance-Leahy Scale: Poor Standing balance comment: overall reliant on UE support                            Cognition Arousal/Alertness: Awake/alert Behavior During Therapy: WFL for tasks assessed/performed Overall Cognitive Status: Within Functional Limits for tasks assessed                                        Exercises      General Comments General comments (skin integrity, edema, etc.): pt educated again on donning/doffing TLSO      Pertinent Vitals/Pain Pain Assessment: 0-10 Pain Score: 7  Pain Location: back, incisional Pain Descriptors / Indicators: Discomfort;Grimacing;Sore Pain Intervention(s): Limited activity within patient's tolerance;Monitored during session;Repositioned    Home Living                      Prior Function            PT Goals (current goals can now be found in the care plan section) Progress towards PT goals: Progressing toward goals    Frequency    Min 5X/week      PT Plan Current plan remains appropriate  Co-evaluation              AM-PAC PT "6 Clicks" Mobility   Outcome Measure  Help needed turning from your back to your side while in a flat bed without using bedrails?: A Little Help needed moving from lying on your back to sitting on the side of a flat bed without using bedrails?: A Little Help needed moving to and from a bed to a chair (including a wheelchair)?: A Little Help needed standing up from a chair using your arms (e.g., wheelchair or bedside chair)?: A Little Help needed to walk in hospital  room?: A Little Help needed climbing 3-5 steps with a railing? : A Little 6 Click Score: 18    End of Session Equipment Utilized During Treatment: Gait belt;Back brace Activity Tolerance: Patient tolerated treatment well Patient left: with call bell/phone within reach;in bed Nurse Communication: Mobility status PT Visit Diagnosis: Unsteadiness on feet (R26.81);Pain Pain - part of body: (back)     Time: [id number] PT Time Calculation (min) (ACUTE ONLY): 25 min  Charges:  $Gait Training: 23-37 mins                     Earney Navy, PTA Acute Rehabilitation Services Pager: [phone] Office: [phone]     Darliss Cheney 11/25/2018, 3:46 PM

## 2018-11-25 NOTE — Progress Notes (Signed)
   Providing Compassionate, Quality Care - Together   Subjective: Patient reports pain has improved since yesterday. There were no new issues overnight.  Objective: Vital signs in last 24 hours: Temp:  [98 F (36.7 C)-98.7 F (37.1 C)] 98.6 F (37 C) (10/01 1311) Pulse Rate:  [90-119] 118 (10/01 1311) Resp:  [17-18] 17 (10/01 1311) BP: (104-131)/(78-88) 121/78 (10/01 1311) SpO2:  [92 %-99 %] 92 % (10/01 1311)  Intake/Output from previous day: 09/30 0701 - 10/01 0700 In: 230  Out: 950 [Urine:900; Drains:50] Intake/Output this shift: No intake/output data recorded.  Alert and oriented x 4 PERRLA CN II-XII grossly intact MAE, Strength and sensation intact Incision is covered with Honeycomb dressing and Steri Strips; Dressing is clean, dry, and intact Hemovac in place  Lab Results: Recent Labs    11/23/18 0553 11/24/18 0409  WBC 13.1* 13.6*  HGB 9.9* 9.9*  HCT 30.8* 29.7*  PLT 259 232   BMET Recent Labs    11/23/18 0553  NA 138  K 3.8  CL 100  CO2 26  GLUCOSE 202*  BUN 17  CREATININE 1.17  CALCIUM 8.6*    Studies/Results: No results found.  Assessment/Plan: Patient is doing better three days status post multilevel fusion. His pain is better controlled. He has not taken the IV dilaudid since yesterday. He is has worked with therapies, who have no further recommendations. He would like to discharge tomorrow morning.   LOS: 3 days    -Remove Hemovac -Discharge tomorrow, 11/26/2018   Viona Gilmore, DNP, AGNP-C Nurse Practitioner  [facility] [address], [place], [place], [place] [address] P: [phone]    F: [phone]  11/25/2018, 11:45 AM

## 2018-11-26 LAB — GLUCOSE, CAPILLARY
Glucose-Capillary: 174 mg/dL — ABNORMAL HIGH (ref 70–99)
Glucose-Capillary: 100 mg/dL — ABNORMAL HIGH (ref 70–99)

## 2018-11-26 NOTE — Discharge Summary (Signed)
Physician Discharge Summary     Providing Compassionate, Quality Care - Together   Patient ID: Thomas Mcclure MRN: [id number] DOB/AGE: October 27, 1955 63 y.o.  Admit date: 11/22/2018 Discharge date: 11/26/2018  Admission Diagnoses: Spinal stenosis of lumbar region with neurogenic claudication  Discharge Diagnoses:  Active Problems:   Spinal stenosis of lumbar region with neurogenic claudication   Discharged Condition: good  Hospital Course: Patient was admitted on 11/22/2018 following a multilevel fusion. He had issues with pain management following the procedure, but pain is now reasonably controlled. He has worked with both physical and occupational therapies. He is ready for discharge home.  Consults: rehabilitation medicine  Significant Diagnostic Studies: radiology: Dg Thoracolumabar Spine  Result Date: 11/22/2018 CLINICAL DATA:  Surgery. EXAM: THORACOLUMBAR SPINE 1V COMPARISON:  None. FINDINGS: Pedicle rods and screws have been placed at multiple levels. The involved levels cannot be assessed on today's limited imaging. IMPRESSION: Hardware placement in the spine as above. The involved levels cannot be definitely assessed on this study. However, a disc spacer device is identified. A previous study on May 31, 2018 demonstrated a disc spacer device at L2-3. Using the disc spacer device on today's study as a landmark, I believe pedicle screws have been added at T11 and T12. Electronically Signed   By: Dorise Bullion III M.D   On: 11/22/2018 13:19   Dg C-arm 1-60 Min  Result Date: 11/22/2018 CLINICAL DATA:  Surgery. EXAM: THORACOLUMBAR SPINE 1V COMPARISON:  None. FINDINGS: Pedicle rods and screws have been placed at multiple levels. The involved levels cannot be assessed on today's limited imaging. IMPRESSION: Hardware placement in the spine as above. The involved levels cannot be definitely assessed on this study. However, a disc spacer device is identified. A previous study on May 31, 2018 demonstrated a disc spacer device at L2-3. Using the disc spacer device on today's study as a landmark, I believe pedicle screws have been added at T11 and T12. Electronically Signed   By: Dorise Bullion III M.D   On: 11/22/2018 13:19     Treatments: surgery:  L1 to laminotomy/foraminotomies/medial facetectomy to decompress the bilateral L1 and L2 nerve roots(the work required to do this was in addition to the work required to do the posterior lumbar interbody fusion because of the patient's spinal stenosis, facet arthropathy. Etc. requiring a wide decompression of the nerve roots.);  L1-2 transforaminal lumbar interbody fusion with local morselized autograft bone, bone morphogenic protein soaked collagen sponges and Zimmer DBM; insertion of interbody prosthesis at L1 to (globus peek expandable interbody prosthesis); posterior segmental instrumentation from T10 to ilium with globus titanium pedicle screws and rods; posterior lateral arthrodesis at T10-11, T11-12, T12-L1, L1-2 with local morselized autograft bone, bone morphogenic protein soaked collagen sponges and Zimmer DBM; exploration of lumbar fusion   Discharge Exam: Blood pressure 124/87, pulse (!) 123, temperature 98.2 F (36.8 C), temperature source Oral, resp. rate 17, height 6' (1.829 m), weight 122 kg, SpO2 95 %.   Alert and oriented x 4 PERRLA CN II-XII grossly intact MAE, Strength and sensation intact Incision is covered with Honeycomb dressing and Steri Strips; Dressing is clean, dry, and intact   Disposition: Discharge disposition: 01-Home or Self Care        Allergies as of 11/26/2018      Reactions   Brilinta [ticagrelor] Shortness Of Breath, Other (See Comments)   CHEST PAIN    Methylprednisolone Other (See Comments)   DIAPHORESIS, HYPOTENSION   Prednisone Other (See Comments)  DIAPHORESIS, HYPOTENSION   Toradol [ketorolac Tromethamine] Other (See Comments)   DIAPHORESIS, HYPOTENSION   Adhesive [tape]  Rash, Other (See Comments)   "blisters" ( NO SURGICAL TAPE, PLEASE)      Medication List    TAKE these medications   acetaminophen 500 MG tablet Commonly known as: TYLENOL Take 1,000 mg by mouth 2 (two) times daily as needed (pain.).   aspirin EC 81 MG tablet Take 1 tablet (81 mg total) by mouth daily.   atorvastatin 80 MG tablet Commonly known as: LIPITOR Take 80 mg by mouth daily at 6 PM.   Belbuca 300 MCG Film Generic drug: Buprenorphine HCl Place 300 mcg inside cheek 2 (two) times daily.   chlorthalidone 25 MG tablet Commonly known as: HYGROTON Take 1 tablet (25 mg total) by mouth daily.   cyclobenzaprine 10 MG tablet Commonly known as: FLEXERIL TAKE 1 TABLET(10 MG) BY MOUTH DAILY AS NEEDED FOR MUSCLE SPASMS What changed: See the new instructions.   diclofenac sodium 1 % Gel Commonly known as: VOLTAREN Apply 1 application topically 4 (four) times daily as needed (pain.).   Dilt-XR 180 MG 24 hr capsule Generic drug: diltiazem Take 180 mg by mouth daily.   DULoxetine 60 MG capsule Commonly known as: CYMBALTA Take 1 capsule (60 mg total) by mouth daily.   glipiZIDE 5 MG tablet Commonly known as: GLUCOTROL Take 5 mg by mouth daily before breakfast.   lisinopril 20 MG tablet Commonly known as: ZESTRIL Take 20 mg by mouth at bedtime.   lubiprostone 24 MCG capsule Commonly known as: AMITIZA Take 48 mcg by mouth at bedtime.   metFORMIN 500 MG 24 hr tablet Commonly known as: Glucophage XR Take 2 tablets (1,000 mg total) by mouth 2 (two) times daily.   metoprolol tartrate 25 MG tablet Commonly known as: LOPRESSOR Take 1 tablet (25 mg total) by mouth 2 (two) times daily.   multivitamin with minerals Tabs tablet Take 1 tablet by mouth daily.   nitroGLYCERIN 0.4 MG SL tablet Commonly known as: NITROSTAT Place 1 tablet (0.4 mg total) under the tongue every 5 (five) minutes as needed for chest pain.   omeprazole 40 MG capsule Commonly known as:  PRILOSEC Take 1 capsule (40 mg total) by mouth daily.   oxyCODONE 15 MG immediate release tablet Commonly known as: ROXICODONE Take 15 mg by mouth 4 (four) times daily as needed for pain.   polyethylene glycol powder 17 GM/SCOOP powder Commonly known as: GLYCOLAX/MIRALAX Take 17 g by mouth daily. Mix in 8 oz water, juice, soda, coffee or tea and drink What changed:   when to take this  reasons to take this   potassium chloride SA 20 MEQ tablet Commonly known as: KLOR-CON Take 1 tablet (20 mEq total) by mouth daily.   pregabalin 200 MG capsule Commonly known as: LYRICA Take 1 capsule (200 mg total) by mouth 2 (two) times daily.   QC LIDOCAINE PAIN RELIEF EX Place 1 patch onto the skin daily as needed (pain.).   senna-docusate 8.6-50 MG tablet Commonly known as: Senokot S Take 1 tablet by mouth 2 (two) times daily. What changed:   how much to take  when to take this   Xtampza ER 36 MG C12a Generic drug: oxyCODONE ER Take 36 mg by mouth daily. What changed: Another medication with the same name was removed. Continue taking this medication, and follow the directions you see here.            Durable Medical Equipment  (  From admission, onward)         Start     Ordered   11/25/18 1638  For home use only DME Shower stool  Once     11/25/18 1637         Follow-up Information    Newman Pies, MD. Schedule an appointment as soon as possible for a visit in 2 week(s).   Specialty: Neurosurgery Contact information: [address] [place] [address] [place] [address] [phone]           Signed: Patricia Nettle 11/26/2018, 10:06 AM

## 2018-11-26 NOTE — Progress Notes (Signed)
D/C instructions provided to patient, denies questions/concerns at this time. Patient transported to front entrance via WC, tol well. 

## 2018-11-26 NOTE — TOC Transition Note (Signed)
Transition of Care [facility]) - CM/SW Discharge Note   Patient Details  Name: Thomas Mcclure MRN: [id number] Date of Birth: [birth year]  [facility]) CM/SW Contact:  Pollie Friar, RN Phone Number: 11/26/2018, 10:58 AM   Clinical Narrative:    CM spoke to patient's wife yesterday and she is requesting home therapies. CM updated NP. Wife also requesting a shower seat for home.  Wife requesting [facility] services. Tiffany with [facility] accepted the referral.  DME to be delivered to the room.  Wife to provide transportation home.   Final next level of care: Home w Home Health Services Barriers to Discharge: No Barriers Identified   Patient Goals and CMS Choice   CMS Medicare.gov Compare Post Acute Care list provided to:: Patient Represenative (must comment) Choice offered to / list presented to : Spouse  Discharge Placement                       Discharge Plan and Services                DME Arranged: Shower stool DME Agency: [employer] Date DME Agency Contacted: 11/26/18   Representative spoke with at DME Agency: [place]: PT, OT [place] Agency: [facility] (formerly [employer]) Date [place]: 11/26/18   Representative spoke with at [place]: [place]) Interventions     Readmission Risk Interventions No flowsheet data found.

## 2018-11-26 NOTE — Progress Notes (Signed)
Physical Therapy Treatment Patient Details Name: Thomas Mcclure MRN: [id number] DOB: 1955/08/09 Today's Date: 11/26/2018    History of Present Illness Pt is a 63 y/o male now s/p PLIF L1-2, explore fusion, posterior instrumentation and fusion T10-S1. PMHx includes HTN, chronic back pain, RA, DM, hx of previous spinal surgeries, hx of cardiac catheterization     PT Comments    Patient is making good progress with PT.  From a mobility standpoint anticipate patient will be ready for DC home when medically ready.    Follow Up Recommendations  No PT follow up;Supervision for mobility/OOB     Equipment Recommendations  None recommended by PT    Recommendations for Other Services       Precautions / Restrictions Precautions Precautions: Fall;Back Required Braces or Orthoses: Spinal Brace Spinal Brace: Thoracolumbosacral orthotic;Applied in sitting position(reviewed proper fit of TLSO with pt) Restrictions Weight Bearing Restrictions: No    Mobility  Bed Mobility Overal bed mobility: Needs Assistance Bed Mobility: Sit to Sidelying;Rolling Rolling: Supervision       Sit to sidelying: Supervision General bed mobility comments: use of rail  Transfers Overall transfer level: Needs assistance Equipment used: Rolling walker (2 wheeled) Transfers: Sit to/from Stand Sit to Stand: Min guard         General transfer comment: carry over of safe hand placement demonstrated  Ambulation/Gait Ambulation/Gait assistance: Min guard Gait Distance (Feet): 150 Feet Assistive device: Rolling walker (2 wheeled) Gait Pattern/deviations: Step-through pattern;Decreased stride length;Trunk flexed Gait velocity: Decreased   General Gait Details: cues for upright posture; distance limited by stomach cramps and pt requesting to return to room   Stairs             Wheelchair Mobility    Modified Rankin (Stroke Patients Only)       Balance Overall balance assessment: Needs  assistance Sitting-balance support: Feet supported Sitting balance-Leahy Scale: Good     Standing balance support: Bilateral upper extremity supported;During functional activity Standing balance-Leahy Scale: Poor Standing balance comment: pt is able to static stand for hand hygiene without UE support; poor for gait                            Cognition Arousal/Alertness: Awake/alert Behavior During Therapy: WFL for tasks assessed/performed Overall Cognitive Status: Within Functional Limits for tasks assessed                                        Exercises      General Comments        Pertinent Vitals/Pain Pain Assessment: 0-10 Pain Score: 7  Pain Location: back, incisional Pain Descriptors / Indicators: Discomfort;Grimacing;Sore Pain Intervention(s): Limited activity within patient's tolerance;Monitored during session;Repositioned    Home Living                      Prior Function            PT Goals (current goals can now be found in the care plan section) Progress towards PT goals: Progressing toward goals    Frequency    Min 5X/week      PT Plan Current plan remains appropriate    Co-evaluation              AM-PAC PT "6 Clicks" Mobility   Outcome Measure  Help needed turning from your back  to your side while in a flat bed without using bedrails?: A Little Help needed moving from lying on your back to sitting on the side of a flat bed without using bedrails?: A Little Help needed moving to and from a bed to a chair (including a wheelchair)?: A Little Help needed standing up from a chair using your arms (e.g., wheelchair or bedside chair)?: A Little Help needed to walk in hospital room?: A Little Help needed climbing 3-5 steps with a railing? : A Little 6 Click Score: 18    End of Session Equipment Utilized During Treatment: Gait belt;Back brace Activity Tolerance: Patient tolerated treatment well Patient  left: with call bell/phone within reach;in chair Nurse Communication: Mobility status PT Visit Diagnosis: Unsteadiness on feet (R26.81);Pain Pain - part of body: (back)     Time: [id number] PT Time Calculation (min) (ACUTE ONLY): 34 min  Charges:  $Gait Training: 8-22 mins $Therapeutic Activity: 8-22 mins                     Earney Navy, PTA Acute Rehabilitation Services Pager: [phone] Office: [phone]     Darliss Cheney 11/26/2018, 1:11 PM

## 2018-11-26 NOTE — Care Management Important Message (Signed)
Important Message  Patient Details  Name: Thomas Mcclure MRN: [id number] Date of Birth: 10-Nov-1955   Medicare Important Message Given:  Yes     Orbie Pyo 11/26/2018, 8:39 AM

## 2018-11-26 NOTE — Discharge Instructions (Signed)
Wound Care Keep incision covered and dry for two days.    Do not put any creams, lotions, or ointments on incision. Leave steri-strips on back.  They will fall off by themselves.  Activity Walk each and every day, increasing distance each day. No lifting greater than 5 lbs.  Avoid excessive neck motion. No driving for 2 weeks; may ride as a passenger locally.  Diet Resume your normal diet.   Return to Work Will be discussed at your follow up appointment.  Call Your Doctor If Any of These Occur Redness, drainage, or swelling at the wound.  Temperature greater than 101 degrees. Severe pain not relieved by pain medication. Incision starts to come apart.  Follow Up Appt Call today for appointment in 1-2 weeks ([phone]) or for problems.  If you have any hardware placed in your spine, you will need an x-ray before your appointment.  

## 2019-01-08 NOTE — Progress Notes (Signed)
Cardiology Office Note:    Date:  01/10/2019   ID:  Thomas Mcclure, DOB 1955/03/17, MRN [id number]  PCP:  Vassie Moment, MD  Cardiologist:  Sinclair Grooms, MD   Referring MD: Vassie Moment, MD   Chief Complaint  Patient presents with  . Coronary Artery Disease  . Advice Only    Elevated heart rate    History of Present Illness:    Thomas Mcclure is a 63 y.o. male with a hx of NSTEMI in 2017status post DES to the RCA. 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 Cath in 2018 demonstrated patent stent.  He is undergoing his fourth orthopedic/spine procedure in the last 3 years.  This most recent procedure was a T2-L2 spine procedure by Dr. Newman Pies.. They feel that Dr. Rockne Menghini has done an excellent job.  They have nothing by complaints about the care received at [facility].  They feel he was stranded and not adequately cared for.    He is doing relatively well relative to recuperation.  Still having significant back pain. He denies angina, significant dyspnea, orthopnea, PND, and peripheral edema.  They complaining Toprol causes him sluggish.  Past Medical History:  Diagnosis Date  . Baker's cyst    Left calf  . CAD in native artery, 07/15/15 PCI of RCA with DES 07/16/2015   a.   NSTEMI 5/17: LHC - pLAD 20, pLCx 20, OM1 30, pRCA 100, EF 45-50%>> PCI: 2.5 x 24 mm Promus DES to RCA  //  b.   Echo 5/17: mild LVH, EF 50-55%, no RWMA, mod RVE //  c. LHC 6/17: pLAD 20, pLCx 20, OM1 50, pRCA stent ok, EF 35-45% with mod sized inf wall and basal segment aneurysm  . Chronic back pain    "down my back, down my legs" (02/18/2017)  . Constipation   . GERD (gastroesophageal reflux disease)   . Hyperlipidemia   . Hypertension    Dr. [name] [phone]  . Ischemic cardiomyopathy    a. LV-gram at time of LHC  in 6/17 with EF 35-45%  //  b. Echo 7/17: EF 45-50%, inferior HK, grade 1 diastolic dysfunction, mildly dilated aortic root, moderately reduced RVSF, mild RAE  . Myocardial infarction ([place]) 2017  . 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 Numbness and tingling of both lower extremities    "on the outside of both sides" (02/18/2017)  . Rheumatoid arthritis ([place])   . Type II diabetes mellitus ([place])    diet controlled    Past Surgical History:  Procedure Laterality Date  . BACK SURGERY     x3  . CARDIAC CATHETERIZATION N/A 07/15/2015   Procedure: Left Heart Cath and Coronary Angiography;  Surgeon: [name], MD;  Location: [place] [facility];  Service: Cardiovascular;  Laterality: N/A;  . CARDIAC CATHETERIZATION N/A 07/15/2015   Procedure: Coronary Stent Intervention;  Surgeon: [name], MD;  Location: [place] [facility];  Service: Cardiovascular;  Laterality: N/A;  . CARDIAC CATHETERIZATION N/A 08/07/2015   Procedure: Left Heart Cath and Coronary Angiography;  Surgeon: [name], MD;  Location: [place] [facility];  Service: Cardiovascular;  Laterality: N/A;  . CORONARY ANGIOPLASTY WITH STENT PLACEMENT  07/2015  . JOINT REPLACEMENT    . LEFT HEART CATH AND CORONARY ANGIOGRAPHY N/A 02/19/2017   Procedure:  LEFT HEART CATH AND CORONARY ANGIOGRAPHY;  Surgeon: Martinique, Peter M, MD;  Location: [place] [facility];  Service: Cardiovascular;  Laterality: N/A;  . LUMBAR SPINE SURGERY  03/2009  & 2012  . MASS EXCISION N/A 04/19/2012   Procedure: removal of posterior cervical lipoma;  Surgeon: [name], MD;  Location: [place] [facility];  Service: Neurosurgery;  Laterality: N/A;  Removal of posterior cervical lipoma  . POPLITEAL SYNOVIAL CYST EXCISION Left    "opened up behind my knee; scraped out arthritis"  . POSTERIOR LUMBAR FUSION 4 LEVEL N/A 11/22/2018   Procedure: Posterior Lateral and Interbody fusion - Lumbar one-Lumbar two; explore fusion, posterior instrumentation  and fusion Thoracic ten to the ilium;  Surgeon: Newman Pies, MD;  Location: [place];  Service: Neurosurgery;  Laterality: N/A;  . SPINAL CORD STIMULATOR INSERTION N/A 01/07/2013   Procedure:  SPINAL CORD STIMULATOR INSERTION;  Surgeon: [name], MD;  Location: [place] [facility];  Service: Neurosurgery;  Laterality: N/A;  . TONSILLECTOMY    . TOTAL HIP ARTHROPLASTY Right 10/03/2016   Procedure: TOTAL HIP ARTHROPLASTY;  Surgeon: [name], MD;  Location: [place];  Service: Orthopedics;  Laterality: Right;  . WISDOM TOOTH EXTRACTION     hx of    Current Medications: Current Meds  Medication Sig  . acetaminophen (TYLENOL) 500 MG tablet Take 1,000 mg by mouth 2 (two) times daily as needed (pain.).  Marland Kitchen aspirin EC 81 MG tablet Take 1 tablet (81 mg total) by mouth daily.  Marland Kitchen atorvastatin (LIPITOR) 80 MG tablet Take 80 mg by mouth daily at 6 PM.  . chlorthalidone (HYGROTON) 25 MG tablet Take 1 tablet (25 mg total) by mouth daily.  . cyclobenzaprine (FLEXERIL) 10 MG tablet TAKE 1 TABLET(10 MG) BY MOUTH DAILY AS NEEDED FOR MUSCLE SPASMS  . diclofenac sodium (VOLTAREN) 1 % GEL Apply 1 application topically 4 (four) times daily as needed (pain.).   Marland Kitchen DILT-XR 180 MG 24 hr capsule Take 180 mg by mouth daily.   . DULoxetine (CYMBALTA) 60 MG capsule Take 1 capsule (60 mg total) by mouth daily.  Marland Kitchen glipiZIDE (GLUCOTROL) 5 MG tablet Take 5 mg by mouth daily before breakfast.   . lisinopril (PRINIVIL,ZESTRIL) 20 MG tablet Take 20 mg by mouth at bedtime.   Marland Kitchen lubiprostone (AMITIZA) 24 MCG capsule Take 48 mcg by mouth at bedtime.   . metFORMIN (GLUCOPHAGE XR) 500 MG 24 hr tablet Take 2 tablets (1,000 mg total) by mouth 2 (two) times daily.  . Multiple Vitamin (MULTIVITAMIN WITH MINERALS) TABS tablet Take 1 tablet by mouth daily.  . nitroGLYCERIN (NITROSTAT) 0.4 MG SL tablet Place 1 tablet (0.4 mg total) under the tongue every 5 (five) minutes as needed for chest pain.  Marland Kitchen omeprazole (PRILOSEC) 40 MG capsule  Take 1 capsule (40 mg total) by mouth daily.  . Oxycodone HCl 20 MG TABS Take 1 tablet by mouth 4 (four) times daily as needed.  . polyethylene glycol powder (GLYCOLAX/MIRALAX) powder Take 17 g by mouth daily. Mix in 8 oz water, juice, soda, coffee or tea and drink  . potassium chloride SA (K-DUR,KLOR-CON) 20 MEQ tablet Take 1 tablet (20 mEq total) by mouth daily.  . pregabalin (LYRICA) 200 MG capsule Take 1 capsule (200 mg total) by mouth 2 (two) times daily.  . QC LIDOCAINE PAIN RELIEF EX Place 1 patch onto the skin daily as needed (pain.).  Marland Kitchen senna-docusate (SENOKOT S) 8.6-50 MG tablet Take 1 tablet by mouth 2 (two) times  daily.  . [DISCONTINUED] metoprolol tartrate (LOPRESSOR) 25 MG tablet Take 1 tablet (25 mg total) by mouth 2 (two) times daily.  . [DISCONTINUED] oxyCODONE (ROXICODONE) 15 MG immediate release tablet Take 15 mg by mouth 4 (four) times daily as needed for pain.      Allergies:   Brilinta [ticagrelor], Methylprednisolone, Prednisone, Toradol [ketorolac tromethamine], and Adhesive [tape]   Social History   Socioeconomic History  . Marital status: Married    Spouse name: Not on file  . Number of children: 5  . Years of education: Not on file  . Highest education level: Not on file  Occupational History  . Occupation: Development worker, community, now on disability due to back pain  Social Needs  . Financial resource strain: Not on file  . Food insecurity    Worry: Not on file    Inability: Not on file  . Transportation needs    Medical: Not on file    Non-medical: Not on file  Tobacco Use  . Smoking status: Former Smoker    Packs/day: 0.25    Years: 40.00    Pack years: 10.00    Types: Cigarettes  . Smokeless tobacco: Never Used  . Tobacco comment: 02/18/2017 "quit in 2012"  Substance and Sexual Activity  . Alcohol use: No    Alcohol/week: 0.0 standard drinks  . Drug use: No  . Sexual activity: Not Currently  Lifestyle  . Physical activity    Days per week:  Not on file    Minutes per session: Not on file  . Stress: Not on file  Relationships  . Social Herbalist on phone: Not on file    Gets together: Not on file    Attends religious service: Not on file    Active member of club or organization: Not on file    Attends meetings of clubs or organizations: Not on file    Relationship status: Not on file  Other Topics Concern  . Not on file  Social History Narrative  . Not on file     Family History: The patient's family history includes Heart disease in his mother; Prostate cancer in his brother.  ROS:   Please see the history of present illness.    He does have back discomfort.  He is not looking forward to an upcoming reprogramming of his spine stimulator.  All other systems reviewed and are negative.  EKGs/Labs/Other Studies Reviewed:    The following studies were reviewed today: 2D Doppler echocardiogram August 2020 IMPRESSIONS    1. The left ventricle has mildly reduced systolic function, with an ejection fraction of 45-50%. The cavity size was normal. There is mildly increased left ventricular wall thickness. Left ventricular diastolic Doppler parameters are consistent with  impaired relaxation.  2. Abnormal septal motion inferior basal hypokinesis.  3. The right ventricle has normal systolic function. The cavity was normal. There is no increase in right ventricular wall thickness.  4. Mild thickening of the mitral valve leaflet. Mild calcification of the mitral valve leaflet. There is mild mitral annular calcification present.  5. The aortic valve is tricuspid. Mild thickening of the aortic valve. Sclerosis without any evidence of stenosis of the aortic valve.  6. The aorta is normal unless otherwise noted.  7. The interatrial septum was not well visualized.   EKG:  EKG not repeated  Recent Labs: 04/26/2018: ALT 39; TSH 0.981 11/23/2018: BUN 17; Creatinine, Ser 1.17; Potassium 3.8; Sodium 138 11/24/2018:  Hemoglobin 9.9;  Platelets 232  Recent Lipid Panel    Component Value Date/Time   CHOL 111 04/26/2018 1035   TRIG 114 04/26/2018 1035   HDL 31 (L) 04/26/2018 1035   CHOLHDL 3.6 04/26/2018 1035   CHOLHDL 4.3 02/19/2017 0222   VLDL 26 02/19/2017 0222   LDLCALC 57 04/26/2018 1035    Physical Exam:    VS:  BP (!) 142/92   Pulse (!) 102   Ht 6' (1.829 m)   Wt 275 lb 12.8 oz (125.1 kg)   SpO2 95%   BMI 37.41 kg/m     Wt Readings from Last 3 Encounters:  01/10/19 275 lb 12.8 oz (125.1 kg)  11/23/18 268 lb 15.4 oz (122 kg)  11/18/18 268 lb 15.4 oz (122 kg)     GEN: Morbid obesity is developing.. No acute distress HEENT: Normal NECK: No JVD. LYMPHATICS: No lymphadenopathy CARDIAC:  RRR without murmur, gallop, or edema. VASCULAR:  Normal Pulses. No bruits. RESPIRATORY:  Clear to auscultation without rales, wheezing or rhonchi  ABDOMEN: Soft, non-tender, non-distended, No pulsatile mass, MUSCULOSKELETAL: No deformity  SKIN: Warm and dry NEUROLOGIC:  Alert and oriented x 3 PSYCHIATRIC:  Normal affect   ASSESSMENT:    1. Coronary artery disease involving native coronary artery of native heart without angina pectoris   2. Essential hypertension   3. Chronic combined systolic and diastolic CHF (congestive heart failure) ([place])   4. Hyperlipidemia with target LDL less than 100   5. Educated about COVID-19 virus infection    PLAN:    In order of problems listed above:  1. Secondary prevention is discussed. 2. Blood pressure is elevated, leading to a change from metoprolol 25 mg twice daily to carvedilol 6.25 mg twice daily.  Follow-up in 1 month with team member to reassess heart rate and blood pressure.  Attempting to get heart rate less and 100 bpm. 3. Carvedilol will be a more appropriate therapy given mild systolic dysfunction. 4. LDL target is less than 70.  Most recently in March was 54. 5. The 3W's is understood and practiced by the patient and wife.  Overall education  and awareness concerning primary/secondary risk prevention was discussed in detail: LDL less than 70, hemoglobin A1c less than 7, blood pressure target less than 130/80 mmHg, >150 minutes of moderate aerobic activity per week, avoidance of smoking, weight control (via diet and exercise), and continued surveillance/management of/for obstructive sleep apnea.    Medication Adjustments/Labs and Tests Ordered: Current medicines are reviewed at length with the patient today.  Concerns regarding medicines are outlined above.  No orders of the defined types were placed in this encounter.  Meds ordered this encounter  Medications  . carvedilol (COREG) 6.25 MG tablet    Sig: Take 1 tablet (6.25 mg total) by mouth 2 (two) times daily.    Dispense:  180 tablet    Refill:  3    D/c Metoprolol    Patient Instructions  Medication Instructions:  1) DISCONTINUE Metoprolol 2) START Carvedilol 6.25mg  twice daily  *If you need a refill on your cardiac medications before your next appointment, please call your pharmacy*  Lab Work: None If you have labs (blood work) drawn today and your tests are completely normal, you will receive your results only by: Marland Kitchen MyChart Message (if you have MyChart) OR . A paper copy in the mail If you have any lab test that is abnormal or we need to change your treatment, we will call you to review the results.  Testing/Procedures: None  Follow-Up:  Your physician recommends that you schedule a follow-up appointment in: 1 month with a PA or NP.   At [facility], you and your health needs are our priority.  As part of our continuing mission to provide you with exceptional heart care, we have created designated Provider Care Teams.  These Care Teams include your primary Cardiologist (physician) and Advanced Practice Providers (APPs -  Physician Assistants and Nurse Practitioners) who all work together to provide you with the care you need, when you need it.  Your next  appointment:   12 months  The format for your next appointment:   In Person  Provider:   You may see Sinclair Grooms, MD or one of the following Advanced Practice Providers on your designated Care Team:    Truitt Merle, NP  Cecilie Kicks, NP  Kathyrn Drown, NP   Other Instructions      Signed, Sinclair Grooms, MD  01/10/2019 9:20 AM    [place]

## 2019-01-10 ENCOUNTER — Ambulatory Visit (INDEPENDENT_AMBULATORY_CARE_PROVIDER_SITE_OTHER): Payer: Medicare Other | Admitting: Interventional Cardiology

## 2019-01-10 ENCOUNTER — Other Ambulatory Visit: Payer: Self-pay

## 2019-01-10 ENCOUNTER — Encounter: Payer: Self-pay | Admitting: Interventional Cardiology

## 2019-01-10 ENCOUNTER — Encounter (INDEPENDENT_AMBULATORY_CARE_PROVIDER_SITE_OTHER): Payer: Self-pay

## 2019-01-10 VITALS — BP 142/92 | HR 102 | Ht 72.0 in | Wt 275.8 lb

## 2019-01-10 DIAGNOSIS — I10 Essential (primary) hypertension: Secondary | ICD-10-CM

## 2019-01-10 DIAGNOSIS — I5042 Chronic combined systolic (congestive) and diastolic (congestive) heart failure: Secondary | ICD-10-CM | POA: Diagnosis not present

## 2019-01-10 DIAGNOSIS — I110 Hypertensive heart disease with heart failure: Secondary | ICD-10-CM | POA: Diagnosis not present

## 2019-01-10 DIAGNOSIS — Z7189 Other specified counseling: Secondary | ICD-10-CM

## 2019-01-10 DIAGNOSIS — E785 Hyperlipidemia, unspecified: Secondary | ICD-10-CM | POA: Diagnosis not present

## 2019-01-10 DIAGNOSIS — I2510 Atherosclerotic heart disease of native coronary artery without angina pectoris: Secondary | ICD-10-CM

## 2019-01-10 MED ORDER — CARVEDILOL 6.25 MG PO TABS: 6.2500 mg | ORAL_TABLET | Freq: Two times a day (BID) | ORAL | 3 refills | Status: DC

## 2019-01-10 NOTE — Patient Instructions (Signed)
Medication Instructions:  1) DISCONTINUE Metoprolol 2) START Carvedilol 6.25mg  twice daily  *If you need a refill on your cardiac medications before your next appointment, please call your pharmacy*  Lab Work: None If you have labs (blood work) drawn today and your tests are completely normal, you will receive your results only by: Marland Kitchen MyChart Message (if you have MyChart) OR . A paper copy in the mail If you have any lab test that is abnormal or we need to change your treatment, we will call you to review the results.  Testing/Procedures: None  Follow-Up:  Your physician recommends that you schedule a follow-up appointment in: 1 month with a PA or NP.   At [facility], you and your health needs are our priority.  As part of our continuing mission to provide you with exceptional heart care, we have created designated Provider Care Teams.  These Care Teams include your primary Cardiologist (physician) and Advanced Practice Providers (APPs -  Physician Assistants and Nurse Practitioners) who all work together to provide you with the care you need, when you need it.  Your next appointment:   12 months  The format for your next appointment:   In Person  Provider:   You may see Sinclair Grooms, MD or one of the following Advanced Practice Providers on your designated Care Team:    Truitt Merle, NP  Cecilie Kicks, NP  Kathyrn Drown, NP   Other Instructions

## 2019-01-18 ENCOUNTER — Emergency Department (HOSPITAL_COMMUNITY): Payer: Medicare Other

## 2019-01-18 ENCOUNTER — Other Ambulatory Visit: Payer: Self-pay

## 2019-01-18 ENCOUNTER — Emergency Department (HOSPITAL_COMMUNITY)
Admission: EM | Admit: 2019-01-18 | Discharge: 2019-01-18 | Disposition: A | Discharge status: Home / Self Care | Payer: Medicare Other | Attending: Emergency Medicine | Admitting: Emergency Medicine

## 2019-01-18 ENCOUNTER — Encounter (HOSPITAL_COMMUNITY): Payer: Self-pay

## 2019-01-18 DIAGNOSIS — E119 Type 2 diabetes mellitus without complications: Secondary | ICD-10-CM | POA: Insufficient documentation

## 2019-01-18 DIAGNOSIS — I252 Old myocardial infarction: Secondary | ICD-10-CM | POA: Diagnosis not present

## 2019-01-18 DIAGNOSIS — Z7982 Long term (current) use of aspirin: Secondary | ICD-10-CM | POA: Insufficient documentation

## 2019-01-18 DIAGNOSIS — M549 Dorsalgia, unspecified: Secondary | ICD-10-CM

## 2019-01-18 DIAGNOSIS — Z87891 Personal history of nicotine dependence: Secondary | ICD-10-CM | POA: Insufficient documentation

## 2019-01-18 DIAGNOSIS — I10 Essential (primary) hypertension: Secondary | ICD-10-CM | POA: Insufficient documentation

## 2019-01-18 DIAGNOSIS — I2510 Atherosclerotic heart disease of native coronary artery without angina pectoris: Secondary | ICD-10-CM | POA: Diagnosis not present

## 2019-01-18 DIAGNOSIS — Z9889 Other specified postprocedural states: Secondary | ICD-10-CM | POA: Diagnosis not present

## 2019-01-18 DIAGNOSIS — Z7984 Long term (current) use of oral hypoglycemic drugs: Secondary | ICD-10-CM | POA: Diagnosis not present

## 2019-01-18 DIAGNOSIS — G8929 Other chronic pain: Secondary | ICD-10-CM | POA: Diagnosis not present

## 2019-01-18 DIAGNOSIS — M545 Low back pain: Secondary | ICD-10-CM

## 2019-01-18 DIAGNOSIS — Z79899 Other long term (current) drug therapy: Secondary | ICD-10-CM | POA: Diagnosis not present

## 2019-01-18 MED ORDER — DIAZEPAM 5 MG PO TABS
5.0000 mg | ORAL_TABLET | Freq: Once | ORAL | Status: AC
  Administered 2019-01-18: 19:00:00 5 mg via ORAL
  Filled 2019-01-18: qty 1

## 2019-01-18 MED ORDER — HYDROMORPHONE HCL 1 MG/ML IJ SOLN: 2.0000 mg | Freq: Once | INTRAMUSCULAR | Status: DC

## 2019-01-18 MED ORDER — HYDROMORPHONE HCL 1 MG/ML IJ SOLN
2.0000 mg | Freq: Once | INTRAMUSCULAR | Status: AC
  Administered 2019-01-18: 2 mg via INTRAMUSCULAR
  Filled 2019-01-18: qty 2

## 2019-01-18 MED ORDER — OXYCODONE-ACETAMINOPHEN 5-325 MG PO TABS
1.0000 | ORAL_TABLET | ORAL | Status: DC | PRN
  Administered 2019-01-18: 1 via ORAL
  Filled 2019-01-18: qty 1

## 2019-01-18 MED ORDER — HYDROMORPHONE HCL 1 MG/ML IJ SOLN
2.0000 mg | Freq: Once | INTRAMUSCULAR | Status: AC
  Administered 2019-01-18: 22:00:00 2 mg via INTRAVENOUS
  Filled 2019-01-18: qty 2

## 2019-01-18 NOTE — ED Provider Notes (Signed)
[facility] EMERGENCY DEPARTMENT Provider Note   CSN: [id number] Arrival date & time: 01/18/19  1346     History   Chief Complaint Chief Complaint  Patient presents with   Back Pain    HPI Thomas Mcclure is a 63 y.o. male with chronic back pain s/p multiple back surgeries and has a spinal stimulator who presents with back pain. He states that he had a lumbar fusion on 9/28 and ever since then his spinal stimulator has been "messing up". It was originally placed in 2014 by Dr. Maryjean Ka. 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 It has greatly helped him with pain over the years. Since the surgery he states that it was only working in one leg. Last week he felt a shock which caused a lot of pain and took 2 days to recover. Today he was overall feeling well until he bent over and he felt a severe shocking pain in his lower back. He had to crawl to get help and had to turn off the stimulator. Since then he reports not being able to walk. He reports severe pain with any movement. No loss of bowel of bladder function. He takes 20mg  Oxy daily every 4 hours  HPI  Past Medical History:  Diagnosis Date   Baker's cyst    Left calf   CAD in native artery, 07/15/15 PCI of RCA with DES 07/16/2015   a.   NSTEMI 5/17: LHC - pLAD 20, pLCx 20, OM1 30, pRCA 100, EF 45-50%>> PCI: 2.5 x 24 mm Promus DES to RCA  //  b.   Echo 5/17: mild LVH, EF 50-55%, no RWMA, mod RVE //  c. LHC 6/17: pLAD 20, pLCx 20, OM1 50, pRCA stent ok, EF 35-45% with mod sized inf wall and basal segment aneurysm   Chronic back pain    "down my back, down my legs" (02/18/2017)   Constipation    GERD (gastroesophageal reflux disease)    Hyperlipidemia    Hypertension    Dr. [name] [phone]   Ischemic cardiomyopathy    a. LV-gram at time of LHC in 6/17 with EF 35-45%  //  b. 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"   Numbness and tingling of both lower extremities    "on the outside of both sides" (02/18/2017)   Rheumatoid arthritis ([place])    Type II diabetes mellitus ([place])    diet controlled    Patient Active Problem List   Diagnosis Date Noted   Spinal stenosis of lumbar region with neurogenic claudication 11/22/2018   Radiculopathy of lumbar region 05/31/2017   HCAP (healthcare-associated pneumonia) 05/30/2017   Leukocytosis 05/30/2017   Lumbar pseudoarthrosis 05/27/2017   Unstable angina ([place]) 02/18/2017   Primary localized osteoarthritis of right hip 10/03/2016   Chest pain, rule out acute myocardial infarction 05/01/2016   Diabetes mellitus type 2, controlled, with complications ([place]) [id number]   Obesity (BMI 30-39.9) 05/01/2016   Left ventricular aneurysm 12/03/2015   Ischemic cardiomyopathy    CAD (coronary artery disease) 07/16/2015   Hypokalemia 07/16/2015   History of non-ST elevation myocardial infarction (NSTEMI) 07/15/2015   Hypertension 07/15/2015   Hyperlipidemia with target LDL less than 100 07/15/2015   Chronic back pain 07/15/2015   GERD (gastroesophageal reflux disease) 06/18/2012   Chronic radicular low back pain 06/18/2012  Past Surgical History:  Procedure Laterality Date   BACK SURGERY     x3   CARDIAC CATHETERIZATION N/A 07/15/2015   Procedure: Left Heart Cath and Coronary Angiography;  Surgeon: [name], MD;  Location: [facility];  Service: Cardiovascular;  Laterality: N/A;   CARDIAC CATHETERIZATION N/A 07/15/2015   Procedure: Coronary Stent Intervention;  Surgeon: [name], MD;  Location: [facility];  Service: Cardiovascular;  Laterality: N/A;   CARDIAC CATHETERIZATION N/A 08/07/2015   Procedure: Left Heart Cath and Coronary Angiography;  Surgeon: [name], MD;  Location: [facility];  Service:  Cardiovascular;  Laterality: N/A;   CORONARY ANGIOPLASTY WITH STENT PLACEMENT  07/2015   JOINT REPLACEMENT     LEFT HEART CATH AND CORONARY ANGIOGRAPHY N/A 02/19/2017   Procedure: LEFT HEART CATH AND CORONARY ANGIOGRAPHY;  Surgeon: Martinique, Peter M, MD;  Location: [facility];  Service: Cardiovascular;  Laterality: N/A;   LUMBAR SPINE SURGERY  03/2009  & 2012   MASS EXCISION N/A 04/19/2012   Procedure: removal of posterior cervical lipoma;  Surgeon: [name], MD;  Location: [facility];  Service: Neurosurgery;  Laterality: N/A;  Removal of posterior cervical lipoma   POPLITEAL SYNOVIAL CYST EXCISION Left    "opened up behind my knee; scraped out arthritis"   POSTERIOR LUMBAR FUSION 4 LEVEL N/A 11/22/2018   Procedure: Posterior Lateral and Interbody fusion - Lumbar one-Lumbar two; explore fusion, posterior instrumentation and fusion Thoracic ten to the ilium;  Surgeon: Newman Pies, MD;  Location: [facility];  Service: Neurosurgery;  Laterality: N/A;   SPINAL CORD STIMULATOR INSERTION N/A 01/07/2013   Procedure:  SPINAL CORD STIMULATOR INSERTION;  Surgeon: [name], MD;  Location: [facility];  Service: Neurosurgery;  Laterality: N/A;   TONSILLECTOMY     TOTAL HIP ARTHROPLASTY Right 10/03/2016   Procedure: TOTAL HIP ARTHROPLASTY;  Surgeon: [name], MD;  Location: [facility];  Service: Orthopedics;  Laterality: Right;   WISDOM TOOTH EXTRACTION     hx of        Home Medications    Prior to Admission medications   Medication Sig Start Date End Date Taking? Authorizing Provider  acetaminophen (TYLENOL) 500 MG tablet Take 1,000 mg by mouth 2 (two) times daily as needed (pain.).    [provider]  aspirin EC 81 MG tablet Take 1 tablet (81 mg total) by mouth daily. 07/18/15   Burns, Arloa Koh, MD  atorvastatin (LIPITOR) 80 MG tablet Take 80 mg by mouth daily at 6 PM.    [provider]  carvedilol (COREG) 6.25 MG tablet Take 1 tablet (6.25 mg  total) by mouth 2 (two) times daily. 01/10/19   [name], MD  chlorthalidone (HYGROTON) 25 MG tablet Take 1 tablet (25 mg total) by mouth daily. 05/27/16   Burtis Junes, NP  cyclobenzaprine (FLEXERIL) 10 MG tablet TAKE 1 TABLET(10 MG) BY MOUTH DAILY AS NEEDED FOR MUSCLE SPASMS 02/16/18   Marrian Salvage, FNP  diclofenac sodium (VOLTAREN) 1 % GEL Apply 1 application topically 4 (four) times daily as needed (pain.).  10/04/18   [provider]  DILT-XR 180 MG 24 hr capsule Take 180 mg by mouth daily.  04/09/16   [provider]  DULoxetine (CYMBALTA) 60 MG capsule Take 1 capsule (60 mg total) by mouth daily. 11/06/17   Marrian Salvage, FNP  glipiZIDE (GLUCOTROL) 5 MG tablet Take 5 mg by mouth daily  before breakfast.     [provider]  lisinopril (PRINIVIL,ZESTRIL) 20 MG tablet Take 20 mg by mouth at bedtime.  07/07/16   [provider]  lubiprostone (AMITIZA) 24 MCG capsule Take 48 mcg by mouth at bedtime.     [provider]  metFORMIN (GLUCOPHAGE XR) 500 MG 24 hr tablet Take 2 tablets (1,000 mg total) by mouth 2 (two) times daily. 11/09/17   Marrian Salvage, FNP  Multiple Vitamin (MULTIVITAMIN WITH MINERALS) TABS tablet Take 1 tablet by mouth daily.    [provider]  nitroGLYCERIN (NITROSTAT) 0.4 MG SL tablet Place 1 tablet (0.4 mg total) under the tongue every 5 (five) minutes as needed for chest pain. 04/26/18   Isaiah Serge, NP  omeprazole (PRILOSEC) 40 MG capsule Take 1 capsule (40 mg total) by mouth daily. 11/06/17   Marrian Salvage, FNP  Oxycodone HCl 20 MG TABS Take 1 tablet by mouth 4 (four) times daily as needed. 01/03/19   [provider]  polyethylene glycol powder (GLYCOLAX/MIRALAX) powder Take 17 g by mouth daily. Mix in 8 oz water, juice, soda, coffee or tea and drink 11/06/17   Marrian Salvage, FNP  potassium chloride SA (K-DUR,KLOR-CON) 20 MEQ tablet Take 1 tablet (20 mEq total) by  mouth daily. 05/24/18   [name], MD  pregabalin (LYRICA) 200 MG capsule Take 1 capsule (200 mg total) by mouth 2 (two) times daily. 08/02/18   Bayard Hugger, NP  QC LIDOCAINE PAIN RELIEF EX Place 1 patch onto the skin daily as needed (pain.).    [provider]  senna-docusate (SENOKOT S) 8.6-50 MG tablet Take 1 tablet by mouth 2 (two) times daily. 05/31/17   Eugenie Filler, MD    Family History Family History  Problem Relation Age of Onset   Heart disease Mother    Prostate cancer Brother     Social History Social History   Tobacco Use   Smoking status: Former Smoker    Packs/day: 0.25    Years: 40.00    Pack years: 10.00    Types: Cigarettes   Smokeless tobacco: Never Used   Tobacco comment: 02/18/2017 "quit in 2012"  Substance Use Topics   Alcohol use: No    Alcohol/week: 0.0 standard drinks   Drug use: No     Allergies   Brilinta [ticagrelor], Methylprednisolone, Prednisone, Toradol [ketorolac tromethamine], and Adhesive [tape]   Review of Systems Review of Systems  Musculoskeletal: Positive for back pain.  Neurological: Negative for weakness and numbness.  All other systems reviewed and are negative.    Physical Exam Updated Vital Signs BP (!) 138/94 (BP Location: Right Arm)    Pulse (!) 105    Temp 98.6 F (37 C) (Oral)    Resp (!) 23    SpO2 100%   Physical Exam Vitals signs and nursing note reviewed.  Constitutional:      General: He is in acute distress.     Appearance: Normal appearance. He is well-developed. He is not ill-appearing.     Comments: Cooperative. In distress due to pain  HENT:     Head: Normocephalic and atraumatic.  Eyes:     General: No scleral icterus.       Right eye: No discharge.        Left eye: No discharge.     Conjunctiva/sclera: Conjunctivae normal.     Pupils: Pupils are equal, round, and reactive to light.  Neck:  Musculoskeletal: Normal range of motion.  Cardiovascular:     Rate and  Rhythm: Normal rate.  Pulmonary:     Effort: Pulmonary effort is normal. No respiratory distress.  Abdominal:     General: There is no distension.  Musculoskeletal:     Comments: Large scar extending from the thoracic to lumbar spine. There is diffuse tenderness and severe pain is elicited with minimal movement. 5/5 strength in both legs  Skin:    General: Skin is warm and dry.  Neurological:     Mental Status: He is alert and oriented to person, place, and time.  Psychiatric:        Behavior: Behavior normal.      ED Treatments / Results  Labs (all labs ordered are listed, but only abnormal results are displayed) Labs Reviewed - No data to display  EKG None  Radiology Dg Lumbar Spine Complete  Result Date: 01/18/2019 CLINICAL DATA:  Low back pain with recent fall EXAM: [facility] 4+ VIEW COMPARISON:  December 14, 2018 FINDINGS: Frontal, lateral, spot lumbosacral lateral, and bilateral oblique views were obtained. There are 5 non-rib-bearing lumbar type vertebral bodies. There is posterior screw and plate fixation from [id number] with surgical screws transfixing each sacroiliac joint as well. Screw tips are in the respective vertebral bodies at all levels. There is a stimulator on the right with lead tips at the T11 and T12 levels. There is no fracture or spondylolisthesis. There is moderate disc space narrowing at all levels. There is facet osteoarthritic change at all levels bilaterally. There is aortic atherosclerosis. There is a total hip replacement on the right. IMPRESSION: Extensive postoperative change at multiple levels. Support hardware appears intact at all levels. Extensive multilevel arthropathy. No fracture or spondylolisthesis. Aortic Atherosclerosis (ICD10-I70.0). Electronically Signed   By: Lowella Grip III M.D.   On: 01/18/2019 17:40    Procedures Procedures (including critical care time)  Medications Ordered in ED Medications    oxyCODONE-acetaminophen (PERCOCET/ROXICET) 5-325 MG per tablet 1 tablet (1 tablet Oral Given 01/18/19 1431)  HYDROmorphone (DILAUDID) injection 2 mg (2 mg Intramuscular Given 01/18/19 1711)  HYDROmorphone (DILAUDID) injection 2 mg (2 mg Intramuscular Given 01/18/19 1852)  diazepam (VALIUM) tablet 5 mg (5 mg Oral Given 01/18/19 1852)     Initial Impression / Assessment and Plan / ED Course  I have reviewed the triage vital signs and the nursing notes.  Pertinent labs & imaging results that were available during my care of the patient were reviewed by me and considered in my medical decision making (see chart for details).  63 year old male presents with acute severe back pain after his spinal cord stimulator shocked him today. He is in severe pain on exam but does not have leg weakness or bowel or bladder incontinence. Pain control given. Discussed with Dr. Wilson Singer. Will obtain xray of the back to assess stimulator leads.  Xray is unremarkable. Will discuss with neurosurgery.  6:15 PM Discussed with Lyndle Herrlich PA with Neurosurgery. He will come to see.  Neurosurgery is recommending CT. At shift change care was signed out to [employer] who will dispo. Pt may need admission for pain control however if pain is adequately controlled he may be able to go home with neurosurgery f/u.  Final Clinical Impressions(s) / ED Diagnoses   Final diagnoses:  Acute midline back pain, unspecified back location    ED Discharge Orders    None       Recardo Evangelist, PA-C  01/18/19 1912    Virgel Manifold, MD 01/18/19 1956

## 2019-01-18 NOTE — ED Notes (Signed)
Pt in excruciating pain transferring from chair to wheelchair. States it feels like

## 2019-01-18 NOTE — ED Notes (Signed)
Pt in tears ambulating from [facility] to recliner, barely able to stand up straight due to pain.

## 2019-01-18 NOTE — Discharge Instructions (Signed)
Please call Dr. Maryjean Ka tomorrow morning to schedule an appointment for follow-up on the spinal stimulator. Continue to take your pain medication as prescribed. You were given a lot of pain medication today for your back, so do not take anymore medication tonight. Return to the ER if you experience numbness/tingling in groin area, bowel/bladder incontinence, or severe weakness. Call your pain management clinic tomorrow to see if they can adjust your pain medications.

## 2019-01-18 NOTE — Consult Note (Addendum)
TRH H&P    Patient Demographics:    Thomas Mcclure, is a 63 y.o. male  MRN: [id number]  DOB - 1955/10/18  Admit Date - 01/18/2019  Referring MD/NP/PA:  Lanna Poche  Outpatient Primary MD for the patient is Vassie Moment, MD Newman Pies  Patient coming from:  home  Chief complaint-  Back pain   HPI:    Thomas Mcclure  is a 63 y.o. male,  w hypertension, hyperlipidemia, Dm2, CAD, Gerd, RA, Chronic back pain, w recent L1 laminotomy/foraminotomies//medial facetectomy to decompress the bilateral L1 and L2 nerve roots(the work required to do this was in addition to the work required to do the posterior lumbar interbody fusion because of the patient's spinal stenosis, facet arthropathy. Etc. requiring a wide decompression of the nerve roots.);  L1-2 transforaminal lumbar interbody fusion with local morselized autograft bone, bone morphogenic protein soaked collagen sponges and Zimmer DBM; insertion of interbody prosthesis at L1 to (globus peek expandable interbody prosthesis); posterior segmental instrumentation from T10 to ilium with globus titanium pedicle screws and rods; posterior lateral arthrodesis at T10-11, T11-12, T12-L1, L1-2 with local morselized autograft bone, bone morphogenic protein soaked collagen sponges and Zimmer DBM; exploration of lumbar fusion on 11/22/2018  apparently presents with c/o spinal stimulator discharging this am.  Pt denies fever, chills, incontinence, radiation of pain to legs.    ED consulted neurosurgery who recommended outpatient follow up in AM at neurosurgery office.    ED requested admission for chronic back pain.    Review of systems:    In addition to the HPI above,  No Fever-chills, No Headache, No changes with Vision or hearing, No problems swallowing food or Liquids, No Chest pain, Cough or Shortness of Breath, No Abdominal pain, No Nausea or Vomiting, bowel  movements are regular, No Blood in stool or Urine, No dysuria, No new skin rashes or bruises,   No new weakness, tingling, numbness in any extremity, No recent weight gain or loss, No polyuria, polydypsia or polyphagia, No significant Mental Stressors.  All other systems reviewed and are negative.    Past History of the following :    Past Medical History:  Diagnosis Date   Baker's cyst    Left calf   CAD in native artery, 07/15/15 PCI of RCA with DES 07/16/2015   a.   NSTEMI 5/17: LHC - pLAD 20, pLCx 20, OM1 30, pRCA 100, EF 45-50%>> PCI: 2.5 x 24 mm Promus DES to RCA  //  b.   Echo 5/17: mild LVH, EF 50-55%, no RWMA, mod RVE //  c. LHC 6/17: pLAD 20, pLCx 20, OM1 50, pRCA stent ok, EF 35-45% with mod sized inf wall and basal segment aneurysm   Chronic back pain    "down my back, down my legs" (02/18/2017)   Constipation    GERD (gastroesophageal reflux disease)    Hyperlipidemia    Hypertension    Dr. [name] [phone]   Ischemic cardiomyopathy    a. LV-gram at time of LHC in 6/17 with EF 35-45%  //  b. 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"   Numbness and tingling of both lower extremities    "on the outside of both sides" (02/18/2017)   Rheumatoid arthritis ([place])    Type II diabetes mellitus ([place])    diet controlled      Past Surgical History:  Procedure Laterality Date   BACK SURGERY     x3   CARDIAC CATHETERIZATION N/A 07/15/2015   Procedure: Left Heart Cath and Coronary Angiography;  Surgeon: [name], MD;  Location: [facility];  Service: Cardiovascular;  Laterality: N/A;   CARDIAC CATHETERIZATION N/A 07/15/2015   Procedure: Coronary Stent Intervention;  Surgeon: [name], MD;  Location: [facility];  Service: Cardiovascular;  Laterality:  N/A;   CARDIAC CATHETERIZATION N/A 08/07/2015   Procedure: Left Heart Cath and Coronary Angiography;  Surgeon: [name], MD;  Location: [facility];  Service: Cardiovascular;  Laterality: N/A;   CORONARY ANGIOPLASTY WITH STENT PLACEMENT  07/2015   JOINT REPLACEMENT     LEFT HEART CATH AND CORONARY ANGIOGRAPHY N/A 02/19/2017   Procedure: LEFT HEART CATH AND CORONARY ANGIOGRAPHY;  Surgeon: Martinique, Peter M, MD;  Location: [facility];  Service: Cardiovascular;  Laterality: N/A;   LUMBAR SPINE SURGERY  03/2009  & 2012   MASS EXCISION N/A 04/19/2012   Procedure: removal of posterior cervical lipoma;  Surgeon: [name], MD;  Location: [facility];  Service: Neurosurgery;  Laterality: N/A;  Removal of posterior cervical lipoma   POPLITEAL SYNOVIAL CYST EXCISION Left    "opened up behind my knee; scraped out arthritis"   POSTERIOR LUMBAR FUSION 4 LEVEL N/A 11/22/2018   Procedure: Posterior Lateral and Interbody fusion - Lumbar one-Lumbar two; explore fusion, posterior instrumentation and fusion Thoracic ten to the ilium;  Surgeon: Newman Pies, MD;  Location: [facility];  Service: Neurosurgery;  Laterality: N/A;   SPINAL CORD STIMULATOR INSERTION N/A 01/07/2013   Procedure:  SPINAL CORD STIMULATOR INSERTION;  Surgeon: [name], MD;  Location: [facility];  Service: Neurosurgery;  Laterality: N/A;   TONSILLECTOMY     TOTAL HIP ARTHROPLASTY Right 10/03/2016   Procedure: TOTAL HIP ARTHROPLASTY;  Surgeon: [name], MD;  Location: [facility];  Service: Orthopedics;  Laterality: Right;   WISDOM TOOTH EXTRACTION     hx of      Social History:      Social History   Tobacco Use   Smoking status: Former Smoker    Packs/day: 0.25    Years: 40.00    Pack years: 10.00    Types: Cigarettes   Smokeless tobacco: Never Used   Tobacco comment: 02/18/2017 "quit in 2012"  Substance Use Topics   Alcohol use: No    Alcohol/week: 0.0 standard drinks        Family History :     Family History  Problem Relation Age of Onset   Heart disease Mother    Prostate cancer Brother        Home Medications:   Prior to Admission medications   Medication Sig Start Date End Date Taking? Authorizing Provider  acetaminophen (TYLENOL) 500 MG tablet Take 1,000 mg by mouth 2 (two) times daily as needed (pain.).    [provider]  aspirin EC 81 MG tablet Take 1 tablet (  81 mg total) by mouth daily. 07/18/15   Burns, Arloa Koh, MD  atorvastatin (LIPITOR) 80 MG tablet Take 80 mg by mouth daily at 6 PM.    [provider]  carvedilol (COREG) 6.25 MG tablet Take 1 tablet (6.25 mg total) by mouth 2 (two) times daily. 01/10/19   [name], MD  chlorthalidone (HYGROTON) 25 MG tablet Take 1 tablet (25 mg total) by mouth daily. 05/27/16   Burtis Junes, NP  cyclobenzaprine (FLEXERIL) 10 MG tablet TAKE 1 TABLET(10 MG) BY MOUTH DAILY AS NEEDED FOR MUSCLE SPASMS 02/16/18   Marrian Salvage, FNP  diclofenac sodium (VOLTAREN) 1 % GEL Apply 1 application topically 4 (four) times daily as needed (pain.).  10/04/18   [provider]  DILT-XR 180 MG 24 hr capsule Take 180 mg by mouth daily.  04/09/16   [provider]  DULoxetine (CYMBALTA) 60 MG capsule Take 1 capsule (60 mg total) by mouth daily. 11/06/17   Marrian Salvage, FNP  glipiZIDE (GLUCOTROL) 5 MG tablet Take 5 mg by mouth daily before breakfast.     [provider]  lisinopril (PRINIVIL,ZESTRIL) 20 MG tablet Take 20 mg by mouth at bedtime.  07/07/16   [provider]  lubiprostone (AMITIZA) 24 MCG capsule Take 48 mcg by mouth at bedtime.     [provider]  metFORMIN (GLUCOPHAGE XR) 500 MG 24 hr tablet Take 2 tablets (1,000 mg total) by mouth 2 (two) times daily. 11/09/17   Marrian Salvage, FNP  Multiple Vitamin (MULTIVITAMIN WITH MINERALS) TABS tablet Take 1 tablet by mouth daily.    [provider]  nitroGLYCERIN (NITROSTAT)  0.4 MG SL tablet Place 1 tablet (0.4 mg total) under the tongue every 5 (five) minutes as needed for chest pain. 04/26/18   Isaiah Serge, NP  omeprazole (PRILOSEC) 40 MG capsule Take 1 capsule (40 mg total) by mouth daily. 11/06/17   Marrian Salvage, FNP  Oxycodone HCl 20 MG TABS Take 1 tablet by mouth 4 (four) times daily as needed. 01/03/19   [provider]  polyethylene glycol powder (GLYCOLAX/MIRALAX) powder Take 17 g by mouth daily. Mix in 8 oz water, juice, soda, coffee or tea and drink 11/06/17   Marrian Salvage, FNP  potassium chloride SA (K-DUR,KLOR-CON) 20 MEQ tablet Take 1 tablet (20 mEq total) by mouth daily. 05/24/18   [name], MD  pregabalin (LYRICA) 200 MG capsule Take 1 capsule (200 mg total) by mouth 2 (two) times daily. 08/02/18   Bayard Hugger, NP  QC LIDOCAINE PAIN RELIEF EX Place 1 patch onto the skin daily as needed (pain.).    [provider]  senna-docusate (SENOKOT S) 8.6-50 MG tablet Take 1 tablet by mouth 2 (two) times daily. 05/31/17   Eugenie Filler, MD     Allergies:     Allergies  Allergen Reactions   Brilinta [Ticagrelor] Shortness Of Breath and Other (See Comments)    CHEST PAIN    Methylprednisolone Other (See Comments)    DIAPHORESIS, HYPOTENSION   Prednisone Other (See Comments)    DIAPHORESIS, HYPOTENSION   Toradol [Ketorolac Tromethamine] Other (See Comments)    DIAPHORESIS, HYPOTENSION     Adhesive [Tape] Rash and Other (See Comments)    "blisters" ( NO SURGICAL TAPE, PLEASE)     Physical Exam:   Vitals  Blood pressure (!) 148/91, pulse 92, temperature 98.6 F (37 C), temperature source Oral, resp. rate (!) 23, SpO2 95 %.  1.  General: axoxo3  2. Psychiatric: euthymic  3. Neurologic: Nonfocal, cn2-12 intact, reflexes 2+ symmetric, diffuse with downgoing toes bilaterally, motor 5/5 in all 4 ext  4. HEENMT:  Anicteric,   5. Respiratory : CTAB ant ausculation  6. Cardiovascular : rrr  s1, s2,   7. Gastrointestinal:  ABd: soft, nt, nd,   8. Skin:  Ext: no c/c/e, onychomycosis  9.Musculoskeletal:  Good ROM    Data Review:    CBC No results for input(s): WBC, HGB, HCT, PLT, MCV, MCH, MCHC, RDW, LYMPHSABS, MONOABS, EOSABS, BASOSABS, BANDABS in the last 168 hours.  Invalid input(s): NEUTRABS, BANDSABD ------------------------------------------------------------------------------------------------------------------  No results found for this or any previous visit (from the past 6 hour(s)).  Chemistries  No results for input(s): NA, K, CL, CO2, GLUCOSE, BUN, CREATININE, CALCIUM, MG, AST, ALT, ALKPHOS, BILITOT in the last 168 hours.  Invalid input(s): GFRCGP ------------------------------------------------------------------------------------------------------------------  ------------------------------------------------------------------------------------------------------------------ GFR: CrCl cannot be calculated (Patient's most recent lab result is older than the maximum 21 days allowed.). Liver Function Tests: No results for input(s): AST, ALT, ALKPHOS, BILITOT, PROT, ALBUMIN in the last 168 hours. No results for input(s): LIPASE, AMYLASE in the last 168 hours. No results for input(s): AMMONIA in the last 168 hours. Coagulation Profile: No results for input(s): INR, PROTIME in the last 168 hours. Cardiac Enzymes: No results for input(s): CKTOTAL, CKMB, CKMBINDEX, TROPONINI in the last 168 hours. BNP (last 3 results) No results for input(s): PROBNP in the last 8760 hours. HbA1C: No results for input(s): HGBA1C in the last 72 hours. CBG: No results for input(s): GLUCAP in the last 168 hours. Lipid Profile: No results for input(s): CHOL, HDL, LDLCALC, TRIG, CHOLHDL, LDLDIRECT in the last 72 hours. Thyroid Function Tests: No results for input(s): TSH, T4TOTAL, FREET4, T3FREE, THYROIDAB in the last 72 hours. Anemia Panel: No results for input(s):  VITAMINB12, FOLATE, FERRITIN, TIBC, IRON, RETICCTPCT in the last 72 hours.  --------------------------------------------------------------------------------------------------------------- Urine analysis:    Component Value Date/Time   COLORURINE YELLOW 05/30/2017 1542   APPEARANCEUR CLEAR 05/30/2017 1542   LABSPEC 1.011 05/30/2017 1542   PHURINE 6.0 05/30/2017 1542   GLUCOSEU NEGATIVE 05/30/2017 1542   HGBUR NEGATIVE 05/30/2017 1542   [place] 05/30/2017 1542   KETONESUR NEGATIVE 05/30/2017 1542   PROTEINUR NEGATIVE 05/30/2017 1542   UROBILINOGEN 1.0 07/26/2012 1552   NITRITE NEGATIVE 05/30/2017 1542   LEUKOCYTESUR NEGATIVE 05/30/2017 1542      Imaging Results:    Dg Lumbar Spine Complete  Result Date: 01/18/2019 CLINICAL DATA:  Low back pain with recent fall EXAM: LUMBAR SPINE - COMPLETE 4+ VIEW COMPARISON:  December 14, 2018 FINDINGS: Frontal, lateral, spot lumbosacral lateral, and bilateral oblique views were obtained. There are 5 non-rib-bearing lumbar type vertebral bodies. There is posterior screw and plate fixation from [id number] with surgical screws transfixing each sacroiliac joint as well. Screw tips are in the respective vertebral bodies at all levels. There is a stimulator on the right with lead tips at the T11 and T12 levels. There is no fracture or spondylolisthesis. There is moderate disc space narrowing at all levels. There is facet osteoarthritic change at all levels bilaterally. There is aortic atherosclerosis. There is a total hip replacement on the right. IMPRESSION: Extensive postoperative change at multiple levels. Support hardware appears intact at all levels. Extensive multilevel arthropathy. No fracture or spondylolisthesis. Aortic Atherosclerosis (ICD10-I70.0). Electronically Signed   By: Lowella Grip III M.D.   On: 01/18/2019 17:40   Ct Lumbar Spine Wo Contrast  Result  Date: 01/18/2019 CLINICAL DATA:  Spinal fusion surgery 4 weeks ago.  Intermittent shocking from spinal stimulator. EXAM: CT LUMBAR SPINE WITHOUT CONTRAST TECHNIQUE: Multidetector CT imaging of the lumbar spine was performed without intravenous contrast administration. Multiplanar CT image reconstructions were also generated. COMPARISON:  Lumbar spine CT 10/26/2018 FINDINGS: Segmentation: Standard Alignment: Straightening of normal lumbar lordosis with long segment posterior instrumented fusion extending from T10-S1 and across the SI joints. There are intervertebral spacers at all levels below L1. Vertebrae: Bilateral laminectomies at L1, L4 and L5. No fracture. Paraspinal and other soft tissues: There is a generator for a spinal stimulator within the right lower quadrant posterior subcutaneous fat. The leads follow an S-shaped course before entering the epidural space just below the T12 spinous process. The tip of the stimulator device is at the left ventral epidural space at the T11 level. Course of the leads is different than on the 10/26/2018 study. The lead tips or not visible on the 10/26/2018 study, but they have clearly retracted inferiorly. The proximal aspect of the stimulator segment is outside the spinal canal (series 6, image 50). Disc levels: There has been interval decompression at the L1-2 level with no residual spinal canal stenosis. There is moderate bilateral neural foraminal stenosis at L2-3 and L3-4 with severe neural foraminal stenosis bilaterally at L4-5 and L5-S1, unchanged. IMPRESSION: 1. Retraction of spinal stimulator leads, with tip now in the left ventral epidural space at the T11 level. The proximal portion of the stimulator segment is outside the spinal canal (series 6, image 50). 2. Status post decompression at the L1-2 level without residual spinal canal stenosis. 3. Unchanged moderate bilateral neural foraminal stenosis at L2-3 and L3-4 and severe neural foraminal stenosis at L4-5 and L5-S1. Electronically Signed   By: Ulyses Jarred M.D.   On:  01/18/2019 20:22       Assessment & Plan:    Active Problems:   Back pain Back pain Trial of dilaudid 2mg  iv x1 Appreciate neurosurgery recommendations Please f/u with neurosurgery in am If pain is uncontrolled , ED can contact us to admit for observation  Time spent in minutes :30 minutes   Jani Gravel M.D on 01/18/2019 at 9:45 PM

## 2019-01-18 NOTE — ED Provider Notes (Signed)
Care assumed from [facility], PA-C at shift change. See her note for full HPI.  In short, patient presents to the ED today for severe, 10/10 low back pain. He has had numerous back operations with his latest being on 9/28 which included a lumbar fusion. Patient has a spinal stimulator that has been malfunctioning for some time now. Today, patient bent over and felt an electric shock in his low back and was unable to ambulate due to severe pain. Patient currently has the stimulator off, but has been unable to control his pain. He is prescribed 20mg  oxy q 4 hours. No red flags concerning for cauda equina.  Physical Exam  BP (!) 148/91   Pulse 92   Temp 98.6 F (37 C) (Oral)   Resp (!) 23   SpO2 95%   Physical Exam Vitals signs and nursing note reviewed.  Constitutional:      General: He is not in acute distress.    Comments: Tearful and very uncomfortable in bed  HENT:     Head: Normocephalic.  Neck:     Musculoskeletal: Neck supple.  Cardiovascular:     Rate and Rhythm: Normal rate and regular rhythm.     Pulses: Normal pulses.     Heart sounds: Normal heart sounds. No murmur. No friction rub. No gallop.   Pulmonary:     Effort: Pulmonary effort is normal.     Breath sounds: Normal breath sounds.  Abdominal:     General: Abdomen is flat. There is no distension.     Palpations: Abdomen is soft.     Tenderness: There is no abdominal tenderness. There is no guarding.  Musculoskeletal:     Comments: 5/5 strength bilaterally in lower extremities. Severe pain is elicited with any movement of back. Unable to assess ambulation due to severe pain. Pulses and sensation intact bilaterally.  Skin:    General: Skin is warm and dry.  Neurological:     General: No focal deficit present.     ED Course/Procedures   Clinical Course as of Jan 17 2226  Tue Jan 18, 2019  2143 Spoke to Dr.Kim with [facility] hospitalist who recommends another round of Dilaudid and then reassessment.    [CC]     Clinical Course User Index [CC] Jonette Eva, PA-C    Procedures  MDM   CT scan personally reviewed which show malposition of stimulator leads. No central cord compression. Spoke to [employer] with neurosurgery who recommends outpatient follow-up. Discussed with patient the CT results and patient is very concerned about his pain management at home given that his current prescription has not been helping his pain. Patient has been given 2 2mg  of dilaudid and 5mg  valium with only mild relief.   I do not feel more opioid pain medication is appropriate to send him home with at this time. Patient deferred muscle relaxers and other pain management. Patient discharged home with pain relatively controlled after a 3rd dose of Dilaudid. Patient advised to follow-up with Dr. Maryjean Ka tomorrow for further evaluation of spinal stimulator. Strict ED precautions discussed with patient. Patient states understanding and agrees to plan. Patient discharged home in no acute distress     Romie Levee 01/18/19 2231    Davonna Belling, MD 01/18/19 [phone]

## 2019-01-18 NOTE — ED Notes (Signed)
Pt verbalized understanding of discharge instructions. Pain management and follow up care reviewed.

## 2019-01-18 NOTE — ED Notes (Signed)
Patient transported to X-ray 

## 2019-01-18 NOTE — ED Triage Notes (Signed)
Pt from home with ems c.o severe lower back pain. Pt has an electric back stimulator that was implanted 8 years ago. Pt saw his surgeon yesterday and they noted that his leads have moved and he needs surgery to fix it. Pt ws walking this morning and felt a big jolt in his back that knocked him to the floor. Denies LOC from fall. Pt a.o, nad noted

## 2019-01-18 NOTE — ED Notes (Signed)
Patient transported to CT 

## 2019-01-18 NOTE — Consult Note (Addendum)
Chief Complaint   Chief Complaint  Patient presents with   Back Pain    HPI   Consult requested by: Janetta Hora, PA-C, [facility] Reason for consult: low back pain  HPI: Thomas Mcclure is a 62 y.o. male with fairly complex and extensive history of back pain including post laminectomy syndrome s/p SCS placement 8ish years ago by Dr Maryjean Ka and recent extension of lumbar fusion now from T9-S1 by Dr [name] in September 2020 who presents to the ED with severe low back pin pain. NSY consultation requested due to extensive history for recommendations. I came by to evaluate the patient. He reports that since his most recent fusion, his SCS has been "acting up". He reports intermittent "shocks" in his lower back in addition to decreased effectiveness. This morning, he was walking and had a severe "shock" in his back. This caused him to fall to the ground. He believes he landed on left side, but unsure. He has been unable to ambulate since due to severity of the pain. Pain affects mid-to lower back and radiates to belly button. He denies bowel/bladder dysfunction or new paresthesias.  He has turned his SCS off.  Patient Active Problem List   Diagnosis Date Noted   Spinal stenosis of lumbar region with neurogenic claudication 11/22/2018   Radiculopathy of lumbar region 05/31/2017   HCAP (healthcare-associated pneumonia) 05/30/2017   Leukocytosis 05/30/2017   Lumbar pseudoarthrosis 05/27/2017   Unstable angina ([place]) 02/18/2017   Primary localized osteoarthritis of right hip 10/03/2016   Chest pain, rule out acute myocardial infarction 05/01/2016   Diabetes mellitus type 2, controlled, with complications ([place]) [id number]   Obesity (BMI 30-39.9) 05/01/2016   Left ventricular aneurysm 12/03/2015   Ischemic cardiomyopathy    CAD (coronary artery disease) 07/16/2015   Hypokalemia 07/16/2015   History of non-ST elevation myocardial infarction (NSTEMI) 07/15/2015   Hypertension  07/15/2015   Hyperlipidemia with target LDL less than 100 07/15/2015   Chronic back pain 07/15/2015   GERD (gastroesophageal reflux disease) 06/18/2012   Chronic radicular low back pain 06/18/2012    PMH: Past Medical History:  Diagnosis Date   Baker's cyst    Left calf   CAD in native artery, 07/15/15 PCI of RCA with DES 07/16/2015   a.   NSTEMI 5/17: LHC - pLAD 20, pLCx 20, OM1 30, pRCA 100, EF 45-50%>> PCI: 2.5 x 24 mm Promus DES to RCA  //  b.   Echo 5/17: mild LVH, EF 50-55%, no RWMA, mod RVE //  c. LHC 6/17: pLAD 20, pLCx 20, OM1 50, pRCA stent ok, EF 35-45% with mod sized inf wall and basal segment aneurysm   Chronic back pain    "down my back, down my legs" (02/18/2017)   Constipation    GERD (gastroesophageal reflux disease)    Hyperlipidemia    Hypertension    Dr. [name] [phone]   Ischemic cardiomyopathy    a. LV-gram at time of LHC in 6/17 with EF 35-45%  //  b. 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"   Numbness and tingling of both lower extremities    "on the outside of both sides" (02/18/2017)   Rheumatoid arthritis (HCC)    Type II diabetes mellitus ([place])    diet controlled    PSH: Past Surgical History:  Procedure Laterality Date   BACK SURGERY     x3   CARDIAC CATHETERIZATION N/A 07/15/2015   Procedure: Left Heart Cath and Coronary Angiography;  Surgeon: [name], MD;  Location: [place] [facility];  Service: Cardiovascular;  Laterality: N/A;   CARDIAC CATHETERIZATION N/A 07/15/2015   Procedure: Coronary Stent Intervention;  Surgeon: [name], MD;  Location: [place] [facility];  Service: Cardiovascular;  Laterality: N/A;   CARDIAC CATHETERIZATION N/A 08/07/2015   Procedure: Left Heart Cath and Coronary Angiography;  Surgeon: [name], MD;   Location: [place] [facility];  Service: Cardiovascular;  Laterality: N/A;   CORONARY ANGIOPLASTY WITH STENT PLACEMENT  07/2015   JOINT REPLACEMENT     LEFT HEART CATH AND CORONARY ANGIOGRAPHY N/A 02/19/2017   Procedure: LEFT HEART CATH AND CORONARY ANGIOGRAPHY;  Surgeon: Martinique, Peter M, MD;  Location: [place] [facility];  Service: Cardiovascular;  Laterality: N/A;   LUMBAR SPINE SURGERY  03/2009  & 2012   MASS EXCISION N/A 04/19/2012   Procedure: removal of posterior cervical lipoma;  Surgeon: [name], MD;  Location: [place] [facility];  Service: Neurosurgery;  Laterality: N/A;  Removal of posterior cervical lipoma   POPLITEAL SYNOVIAL CYST EXCISION Left    "opened up behind my knee; scraped out arthritis"   POSTERIOR LUMBAR FUSION 4 LEVEL N/A 11/22/2018   Procedure: Posterior Lateral and Interbody fusion - Lumbar one-Lumbar two; explore fusion, posterior instrumentation and fusion Thoracic ten to the ilium;  Surgeon: Newman Pies, MD;  Location: [facility];  Service: Neurosurgery;  Laterality: N/A;   SPINAL CORD STIMULATOR INSERTION N/A 01/07/2013   Procedure:  SPINAL CORD STIMULATOR INSERTION;  Surgeon: [name], MD;  Location: [place] [facility];  Service: Neurosurgery;  Laterality: N/A;   TONSILLECTOMY     TOTAL HIP ARTHROPLASTY Right 10/03/2016   Procedure: TOTAL HIP ARTHROPLASTY;  Surgeon: [name], MD;  Location: [facility];  Service: Orthopedics;  Laterality: Right;   WISDOM TOOTH EXTRACTION     hx of    (Not in a hospital admission)   SH: Social History   Tobacco Use   Smoking status: Former Smoker    Packs/day: 0.25    Years: 40.00    Pack years: 10.00    Types: Cigarettes   Smokeless tobacco: Never Used   Tobacco comment: 02/18/2017 "quit in 2012"  Substance Use Topics   Alcohol use: No    Alcohol/week: 0.0 standard drinks   Drug use: No    MEDS: Prior to Admission medications   Medication Sig Start Date End Date Taking? Authorizing Provider    acetaminophen (TYLENOL) 500 MG tablet Take 1,000 mg by mouth 2 (two) times daily as needed (pain.).    [provider]  aspirin EC 81 MG tablet Take 1 tablet (81 mg total) by mouth daily. 07/18/15   Burns, Arloa Koh, MD  atorvastatin (LIPITOR) 80 MG tablet Take 80 mg by mouth daily at 6 PM.    [provider]  carvedilol (COREG) 6.25 MG tablet Take 1 tablet (6.25 mg total) by mouth 2 (two) times daily. 01/10/19   [name], MD  chlorthalidone (HYGROTON) 25 MG tablet Take 1 tablet (25 mg total) by mouth daily. 05/27/16   Burtis Junes, NP  cyclobenzaprine (FLEXERIL) 10 MG tablet TAKE 1 TABLET(10 MG) BY MOUTH DAILY AS NEEDED FOR MUSCLE SPASMS 02/16/18   Marrian Salvage, FNP  diclofenac sodium (VOLTAREN) 1 % GEL Apply 1 application topically  4 (four) times daily as needed (pain.).  10/04/18   [provider]  DILT-XR 180 MG 24 hr capsule Take 180 mg by mouth daily.  04/09/16   [provider]  DULoxetine (CYMBALTA) 60 MG capsule Take 1 capsule (60 mg total) by mouth daily. 11/06/17   Marrian Salvage, FNP  glipiZIDE (GLUCOTROL) 5 MG tablet Take 5 mg by mouth daily before breakfast.     [provider]  lisinopril (PRINIVIL,ZESTRIL) 20 MG tablet Take 20 mg by mouth at bedtime.  07/07/16   [provider]  lubiprostone (AMITIZA) 24 MCG capsule Take 48 mcg by mouth at bedtime.     [provider]  metFORMIN (GLUCOPHAGE XR) 500 MG 24 hr tablet Take 2 tablets (1,000 mg total) by mouth 2 (two) times daily. 11/09/17   Marrian Salvage, FNP  Multiple Vitamin (MULTIVITAMIN WITH MINERALS) TABS tablet Take 1 tablet by mouth daily.    [provider]  nitroGLYCERIN (NITROSTAT) 0.4 MG SL tablet Place 1 tablet (0.4 mg total) under the tongue every 5 (five) minutes as needed for chest pain. 04/26/18   Isaiah Serge, NP  omeprazole (PRILOSEC) 40 MG capsule Take 1 capsule (40 mg total) by mouth daily. 11/06/17   Marrian Salvage, FNP  Oxycodone HCl 20 MG TABS Take 1 tablet by mouth 4 (four) times daily as needed. 01/03/19   [provider]  polyethylene glycol powder (GLYCOLAX/MIRALAX) powder Take 17 g by mouth daily. Mix in 8 oz water, juice, soda, coffee or tea and drink 11/06/17   Marrian Salvage, FNP  potassium chloride SA (K-DUR,KLOR-CON) 20 MEQ tablet Take 1 tablet (20 mEq total) by mouth daily. 05/24/18   [name], MD  pregabalin (LYRICA) 200 MG capsule Take 1 capsule (200 mg total) by mouth 2 (two) times daily. 08/02/18   Bayard Hugger, NP  QC LIDOCAINE PAIN RELIEF EX Place 1 patch onto the skin daily as needed (pain.).    [provider]  senna-docusate (SENOKOT S) 8.6-50 MG tablet Take 1 tablet by mouth 2 (two) times daily. 05/31/17   Eugenie Filler, MD    ALLERGY: Allergies  Allergen Reactions   Brilinta [Ticagrelor] Shortness Of Breath and Other (See Comments)    CHEST PAIN    Methylprednisolone Other (See Comments)    DIAPHORESIS, HYPOTENSION   Prednisone Other (See Comments)    DIAPHORESIS, HYPOTENSION   Toradol [Ketorolac Tromethamine] Other (See Comments)    DIAPHORESIS, HYPOTENSION     Adhesive [Tape] Rash and Other (See Comments)    "blisters" ( NO SURGICAL TAPE, PLEASE)    Social History   Tobacco Use   Smoking status: Former Smoker    Packs/day: 0.25    Years: 40.00    Pack years: 10.00    Types: Cigarettes   Smokeless tobacco: Never Used   Tobacco comment: 02/18/2017 "quit in 2012"  Substance Use Topics   Alcohol use: No    Alcohol/week: 0.0 standard drinks     Family History  Problem Relation Age of Onset   Heart disease Mother    Prostate cancer Brother      ROS   Review of Systems  Constitutional: Negative.   HENT: Negative.   Eyes: Negative.   Respiratory: Negative.   Cardiovascular: Negative.   Gastrointestinal: Negative.   Genitourinary: Negative.   Musculoskeletal: Positive for back pain and myalgias.  Negative for neck pain.  Skin: Negative.   Neurological: Positive for weakness. Negative for  dizziness, tingling, tremors, sensory change, speech change, focal weakness and headaches.    Exam   Vitals:   01/18/19 1631 01/18/19 1855  BP: (!) 138/94 (!) 148/91  Pulse: (!) 105 92  Resp: (!) 23   Temp: 98.6 F (37 C)   SpO2: 100% 95%   General appearance: appears uncomfortable Eyes: No scleral injection Cardiovascular: Regular rate and rhythm without murmurs, rubs, gallops. No edema or variciosities. Distal pulses normal. Pulmonary: Effort normal, non-labored breathing Musculoskeletal:     Muscle tone upper extremities: Normal    Muscle tone lower extremities: Normal    Motor exam: Upper Extremities Deltoid Bicep Tricep Grip  Right 5/5 5/5 5/5 5/5  Left 5/5 5/5 5/5 5/5   Lower Extremity IP Quad PF DF EHL  Right 4/5 4/5 5/5 5/5 5/5  Left 4/5 4/5 5/5 5/5 5/5   Neurological Mental Status:    - Patient is awake, alert, oriented to person, place, month, year, and situation    - Patient is able to give a clear and coherent history.    - No signs of aphasia or neglect Cranial Nerves    - II: Visual Fields are full. PERRL    - III/IV/VI: EOMI without ptosis or diploplia.     - V: Facial sensation is grossly normal    - VII: Facial movement is symmetric.     - VIII: hearing is intact to voice    - X: Uvula elevates symmetrically    - XI: Shoulder shrug is symmetric.    - XII: tongue is midline without atrophy or fasciculations.  Sensory: Sensation grossly intact to LT Vertical TL incision - healing well. No evidence of dehiscence or infection  Results - Imaging/Labs   No results found for this or any previous visit (from the past 59 hour(s)).  Dg Lumbar Spine Complete  Result Date: 01/18/2019 CLINICAL DATA:  Low back pain with recent fall EXAM: LUMBAR SPINE - COMPLETE 4+ VIEW COMPARISON:  December 14, 2018 FINDINGS: Frontal, lateral, spot lumbosacral lateral, and bilateral  oblique views were obtained. There are 5 non-rib-bearing lumbar type vertebral bodies. There is posterior screw and plate fixation from [id number] with surgical screws transfixing each sacroiliac joint as well. Screw tips are in the respective vertebral bodies at all levels. There is a stimulator on the right with lead tips at the T11 and T12 levels. There is no fracture or spondylolisthesis. There is moderate disc space narrowing at all levels. There is facet osteoarthritic change at all levels bilaterally. There is aortic atherosclerosis. There is a total hip replacement on the right. IMPRESSION: Extensive postoperative change at multiple levels. Support hardware appears intact at all levels. Extensive multilevel arthropathy. No fracture or spondylolisthesis. Aortic Atherosclerosis (ICD10-I70.0). Electronically Signed   By: Lowella Grip III M.D.   On: 01/18/2019 17:40   Impression/Plan   63 y.o. male with extensive chronic back pain history, s/p SCS stimulator placement and T9-S1 fusion (multiple surgeries), most recently in September 2020 by Dr [name]. He has new back pain after being "shocked" by his SCS. He has pain mediated weakness proximal BLE, but normal strength distally. Unclear what is causing this severe pain. Certainly agree with turning SCS off and outpt f/u with [employer] and Dr Maryjean Ka. Will obtain CT T/L spine to assess hardware and r/o central canal stenosis. If normal, can d/c home from our perspective.  Addendum CT T/L spine reviewed. SCS leads have retracted. CT otherwise looks good with good placement of hardware, no  evidence of failure/fracture or central canal stenosis. Do not see a cause for this drastic pain he is experiencing. SCS will need to be addressed on an outpatient basis with Dr Maryjean Ka. Nothing for NS to offer at this point.   Ferne Reus, PA-C [facility] [employer]

## 2019-01-28 ENCOUNTER — Other Ambulatory Visit: Payer: Self-pay | Admitting: Neurosurgery

## 2019-02-02 ENCOUNTER — Telehealth: Payer: Self-pay | Admitting: *Deleted

## 2019-02-02 ENCOUNTER — Other Ambulatory Visit: Payer: Self-pay | Admitting: Neurosurgery

## 2019-02-02 NOTE — Telephone Encounter (Signed)
Our office received an office note from Dr. [name] office today. In the ov note Plan section Dr. [name] states pt is agreeable to surgery. I called Dr. [name] office to confirm is this just an ov note for [name] or is the ov note requesting cardiac clearance. I did request a call back from [facility], Dr. [name] surgery scheduler to confirm.

## 2019-02-02 NOTE — Telephone Encounter (Addendum)
   Primary Cardiologist: Sinclair Grooms, MD  Chart reviewed and patient contacted by phone today as part of pre-operative protocol coverage. Given past medical history and time since last visit, based on ACC/AHA guidelines, Thomas Mcclure would be at acceptable risk for the planned procedure without further cardiovascular testing.   OK to hold aspirin 5-7 days pre op if needed.  I will route this recommendation to the requesting party via Epic fax function and remove from pre-op pool.  Please call with questions.  Kerin Ransom, PA-C 02/02/2019, 3:05 PM

## 2019-02-02 NOTE — Telephone Encounter (Signed)
   [facility] Pre-operative Risk Assessment    Request for surgical clearance:  1. What type of surgery is being performed? SPINAL CORD STIMULATOR REPLACEMENT    2. When is this surgery scheduled? 02/09/19   3. What type of clearance is required (medical clearance vs. Pharmacy clearance to hold med vs. Both)? MEDICAL  4. Are there any medications that need to be held prior to surgery and how long? ASA    5. Practice name and name of physician performing surgery? [facility]; DR. [name]   6. What is your office phone number [phone]    7.   What is your office fax number [phone]  8.   Anesthesia type (None, local, MAC, general) ? GENERAL   Thomas Mcclure 02/02/2019, 11:45 AM  _________________________________________________________________   (provider comments below)

## 2019-02-07 ENCOUNTER — Other Ambulatory Visit (HOSPITAL_COMMUNITY)
Admission: RE | Admit: 2019-02-07 | Discharge: 2019-02-07 | Disposition: A | Discharge status: Home / Self Care | Payer: Medicare Other | Source: Ambulatory Visit | Attending: Neurosurgery | Admitting: Neurosurgery

## 2019-02-07 DIAGNOSIS — Z20828 Contact with and (suspected) exposure to other viral communicable diseases: Secondary | ICD-10-CM | POA: Insufficient documentation

## 2019-02-07 DIAGNOSIS — Z01812 Encounter for preprocedural laboratory examination: Secondary | ICD-10-CM | POA: Insufficient documentation

## 2019-02-07 LAB — SARS CORONAVIRUS 2 (TAT 6-24 HRS): SARS Coronavirus 2: NEGATIVE

## 2019-02-08 ENCOUNTER — Encounter (HOSPITAL_COMMUNITY): Payer: Self-pay | Admitting: Neurosurgery

## 2019-02-08 ENCOUNTER — Other Ambulatory Visit: Payer: Self-pay

## 2019-02-08 MED ORDER — DEXTROSE 5 % IV SOLN
3.0000 g | INTRAVENOUS | Status: AC
  Administered 2019-02-09: 3 g via INTRAVENOUS
  Filled 2019-02-08: qty 3

## 2019-02-08 NOTE — Progress Notes (Signed)
Thomas Mcclure denies chest pain or shortness of breath.Patient  Had Covid test yesterday and has been in quarantine with his wife since that time.  Thomas Lilja has type II diabetes. I instructed patient to not take any medications for diabetes in am.I instructed patient to check CBG after awaking and every 2 hours until arrival  to the hospital.  I Instructed patient if CBG is less than 70 to drink 1/2 cup of a clear juice. Recheck CBG in 15 minutes then call pre- op desk at [phone] for further instructions. CBGs run 118 - 120. Information was given by Thomas Outcalt wife with patient in the room answering as needed, per patient's request.

## 2019-02-09 ENCOUNTER — Ambulatory Visit (HOSPITAL_COMMUNITY): Payer: Medicare Other | Admitting: Certified Registered Nurse Anesthetist

## 2019-02-09 ENCOUNTER — Encounter (HOSPITAL_COMMUNITY): Payer: Self-pay | Admitting: Neurosurgery

## 2019-02-09 ENCOUNTER — Other Ambulatory Visit: Payer: Self-pay

## 2019-02-09 ENCOUNTER — Ambulatory Visit (HOSPITAL_COMMUNITY): Payer: Medicare Other

## 2019-02-09 ENCOUNTER — Ambulatory Visit (HOSPITAL_COMMUNITY)
Admission: RE | Admit: 2019-02-09 | Discharge: 2019-02-10 | Disposition: A | Discharge status: Home / Self Care | Payer: Medicare Other | Attending: Neurosurgery | Admitting: Neurosurgery

## 2019-02-09 ENCOUNTER — Encounter (HOSPITAL_COMMUNITY)
Admission: RE | Disposition: A | Discharge status: Home / Self Care | Payer: Self-pay | Source: Home / Self Care | Attending: Neurosurgery

## 2019-02-09 DIAGNOSIS — T85890A Other specified complication of nervous system prosthetic devices, implants and grafts, initial encounter: Secondary | ICD-10-CM | POA: Insufficient documentation

## 2019-02-09 DIAGNOSIS — Z7982 Long term (current) use of aspirin: Secondary | ICD-10-CM | POA: Insufficient documentation

## 2019-02-09 DIAGNOSIS — I255 Ischemic cardiomyopathy: Secondary | ICD-10-CM | POA: Diagnosis not present

## 2019-02-09 DIAGNOSIS — Z91048 Other nonmedicinal substance allergy status: Secondary | ICD-10-CM | POA: Insufficient documentation

## 2019-02-09 DIAGNOSIS — Z79899 Other long term (current) drug therapy: Secondary | ICD-10-CM | POA: Insufficient documentation

## 2019-02-09 DIAGNOSIS — Z981 Arthrodesis status: Secondary | ICD-10-CM | POA: Diagnosis not present

## 2019-02-09 DIAGNOSIS — Z96641 Presence of right artificial hip joint: Secondary | ICD-10-CM | POA: Diagnosis not present

## 2019-02-09 DIAGNOSIS — Z7984 Long term (current) use of oral hypoglycemic drugs: Secondary | ICD-10-CM | POA: Insufficient documentation

## 2019-02-09 DIAGNOSIS — M545 Low back pain: Secondary | ICD-10-CM | POA: Insufficient documentation

## 2019-02-09 DIAGNOSIS — Z8249 Family history of ischemic heart disease and other diseases of the circulatory system: Secondary | ICD-10-CM | POA: Diagnosis not present

## 2019-02-09 DIAGNOSIS — I252 Old myocardial infarction: Secondary | ICD-10-CM | POA: Insufficient documentation

## 2019-02-09 DIAGNOSIS — Z888 Allergy status to other drugs, medicaments and biological substances status: Secondary | ICD-10-CM | POA: Insufficient documentation

## 2019-02-09 DIAGNOSIS — K219 Gastro-esophageal reflux disease without esophagitis: Secondary | ICD-10-CM | POA: Insufficient documentation

## 2019-02-09 DIAGNOSIS — Z6839 Body mass index (BMI) 39.0-39.9, adult: Secondary | ICD-10-CM | POA: Insufficient documentation

## 2019-02-09 DIAGNOSIS — Z955 Presence of coronary angioplasty implant and graft: Secondary | ICD-10-CM | POA: Diagnosis not present

## 2019-02-09 DIAGNOSIS — G8929 Other chronic pain: Secondary | ICD-10-CM | POA: Diagnosis present

## 2019-02-09 DIAGNOSIS — M069 Rheumatoid arthritis, unspecified: Secondary | ICD-10-CM | POA: Insufficient documentation

## 2019-02-09 DIAGNOSIS — Z791 Long term (current) use of non-steroidal anti-inflammatories (NSAID): Secondary | ICD-10-CM | POA: Insufficient documentation

## 2019-02-09 DIAGNOSIS — I25119 Atherosclerotic heart disease of native coronary artery with unspecified angina pectoris: Secondary | ICD-10-CM | POA: Insufficient documentation

## 2019-02-09 DIAGNOSIS — I10 Essential (primary) hypertension: Secondary | ICD-10-CM | POA: Insufficient documentation

## 2019-02-09 DIAGNOSIS — Z8042 Family history of malignant neoplasm of prostate: Secondary | ICD-10-CM | POA: Insufficient documentation

## 2019-02-09 DIAGNOSIS — E785 Hyperlipidemia, unspecified: Secondary | ICD-10-CM | POA: Insufficient documentation

## 2019-02-09 DIAGNOSIS — R000 Tachycardia, unspecified: Secondary | ICD-10-CM | POA: Insufficient documentation

## 2019-02-09 DIAGNOSIS — E669 Obesity, unspecified: Secondary | ICD-10-CM | POA: Insufficient documentation

## 2019-02-09 DIAGNOSIS — Z87891 Personal history of nicotine dependence: Secondary | ICD-10-CM | POA: Insufficient documentation

## 2019-02-09 DIAGNOSIS — Z419 Encounter for procedure for purposes other than remedying health state, unspecified: Secondary | ICD-10-CM

## 2019-02-09 DIAGNOSIS — E119 Type 2 diabetes mellitus without complications: Secondary | ICD-10-CM | POA: Diagnosis not present

## 2019-02-09 HISTORY — DX: Adverse effect of other opioids, initial encounter: T40.2X5A

## 2019-02-09 HISTORY — DX: Adverse effect of other opioids, initial encounter: K59.03

## 2019-02-09 HISTORY — DX: Tachycardia, unspecified: R00.0

## 2019-02-09 HISTORY — DX: Drug induced constipation: K59.03

## 2019-02-09 HISTORY — PX: SPINAL CORD STIMULATOR INSERTION: SHX5378

## 2019-02-09 LAB — BASIC METABOLIC PANEL
Anion gap: 12 (ref 5–15)
BUN: 14 mg/dL (ref 8–23)
CO2: 27 mmol/L (ref 22–32)
Calcium: 9.4 mg/dL (ref 8.9–10.3)
Chloride: 98 mmol/L (ref 98–111)
Creatinine, Ser: 1.09 mg/dL (ref 0.61–1.24)
GFR calc Af Amer: >60 mL/min (ref >60)
GFR calc non Af Amer: >60 mL/min (ref >60)
Glucose, Bld: 125 mg/dL — ABNORMAL HIGH (ref 70–99)
Potassium: 4.3 mmol/L (ref 3.5–5.1)
Sodium: 137 mmol/L (ref 135–145)

## 2019-02-09 LAB — HEMOGLOBIN A1C
Hgb A1c MFr Bld: 7.5 % — ABNORMAL HIGH (ref 4.8–5.6)
Mean Plasma Glucose: 168.55 mg/dL

## 2019-02-09 LAB — GLUCOSE, CAPILLARY
Glucose-Capillary: 131 mg/dL — ABNORMAL HIGH (ref 70–99)
Glucose-Capillary: 134 mg/dL — ABNORMAL HIGH (ref 70–99)
Glucose-Capillary: 139 mg/dL — ABNORMAL HIGH (ref 70–99)
Glucose-Capillary: 181 mg/dL — ABNORMAL HIGH (ref 70–99)
Glucose-Capillary: 406 mg/dL — ABNORMAL HIGH (ref 70–99)
Glucose-Capillary: 463 mg/dL — ABNORMAL HIGH (ref 70–99)
Glucose-Capillary: 263 mg/dL — ABNORMAL HIGH (ref 70–99)

## 2019-02-09 LAB — CBC
HCT: 37.2 % — ABNORMAL LOW (ref 39.0–52.0)
Hemoglobin: 11.4 g/dL — ABNORMAL LOW (ref 13.0–17.0)
MCH: 24.7 pg — ABNORMAL LOW (ref 26.0–34.0)
MCHC: 30.6 g/dL (ref 30.0–36.0)
MCV: 80.5 fL (ref 80.0–100.0)
Platelets: 364 10*3/uL (ref 150–400)
RBC: 4.62 MIL/uL (ref 4.22–5.81)
RDW: 16.4 % — ABNORMAL HIGH (ref 11.5–15.5)
WBC: 7.7 10*3/uL (ref 4.0–10.5)
nRBC: 0 % (ref 0.0–0.2)

## 2019-02-09 SURGERY — INSERTION, SPINAL CORD STIMULATOR, LUMBAR
Anesthesia: General | Site: Thoracic

## 2019-02-09 MED ORDER — DOCUSATE SODIUM 100 MG PO CAPS
100.0000 mg | ORAL_CAPSULE | Freq: Two times a day (BID) | ORAL | Status: DC
  Administered 2019-02-09 – 2019-02-10 (×2): 100 mg via ORAL
  Filled 2019-02-09 (×2): qty 1

## 2019-02-09 MED ORDER — ACETAMINOPHEN 500 MG PO TABS
1000.0000 mg | ORAL_TABLET | Freq: Four times a day (QID) | ORAL | Status: AC
  Administered 2019-02-09 – 2019-02-10 (×4): 1000 mg via ORAL
  Filled 2019-02-09 (×4): qty 2

## 2019-02-09 MED ORDER — PHENOL 1.4 % MT LIQD: 1.0000 | OROMUCOSAL | Status: DC | PRN

## 2019-02-09 MED ORDER — SENNOSIDES-DOCUSATE SODIUM 8.6-50 MG PO TABS
1.0000 | ORAL_TABLET | Freq: Two times a day (BID) | ORAL | Status: DC
  Administered 2019-02-09 – 2019-02-10 (×2): 1 via ORAL
  Filled 2019-02-09 (×2): qty 1

## 2019-02-09 MED ORDER — 0.9 % SODIUM CHLORIDE (POUR BTL) OPTIME
TOPICAL | Status: DC | PRN
  Administered 2019-02-09: 1000 mL

## 2019-02-09 MED ORDER — OXYCODONE HCL 5 MG PO TABS
20.0000 mg | ORAL_TABLET | ORAL | Status: DC | PRN
  Administered 2019-02-09 – 2019-02-10 (×5): 20 mg via ORAL
  Filled 2019-02-09 (×4): qty 4

## 2019-02-09 MED ORDER — FENTANYL CITRATE (PF) 100 MCG/2ML IJ SOLN: 25.0000 ug | INTRAMUSCULAR | Status: DC | PRN

## 2019-02-09 MED ORDER — CYCLOBENZAPRINE HCL 10 MG PO TABS
10.0000 mg | ORAL_TABLET | Freq: Three times a day (TID) | ORAL | Status: DC | PRN
  Filled 2019-02-09: qty 1

## 2019-02-09 MED ORDER — ONDANSETRON HCL 4 MG/2ML IJ SOLN: 4.0000 mg | Freq: Four times a day (QID) | INTRAMUSCULAR | Status: DC | PRN

## 2019-02-09 MED ORDER — LIDOCAINE 2% (20 MG/ML) 5 ML SYRINGE
INTRAMUSCULAR | Status: AC
  Filled 2019-02-09: qty 5

## 2019-02-09 MED ORDER — FENTANYL CITRATE (PF) 250 MCG/5ML IJ SOLN
INTRAMUSCULAR | Status: AC
  Filled 2019-02-09: qty 5

## 2019-02-09 MED ORDER — POLYETHYLENE GLYCOL 3350 17 GM/SCOOP PO POWD
17.0000 g | Freq: Every day | ORAL | Status: DC
  Administered 2019-02-09 – 2019-02-10 (×2): 17 g via ORAL
  Filled 2019-02-09: qty 255

## 2019-02-09 MED ORDER — ACETAMINOPHEN 325 MG PO TABS: 650.0000 mg | ORAL_TABLET | ORAL | Status: DC | PRN

## 2019-02-09 MED ORDER — CHLORHEXIDINE GLUCONATE CLOTH 2 % EX PADS: 6.0000 | MEDICATED_PAD | Freq: Once | CUTANEOUS | Status: DC

## 2019-02-09 MED ORDER — ACETAMINOPHEN 500 MG PO TABS: 1000.0000 mg | ORAL_TABLET | Freq: Two times a day (BID) | ORAL | Status: DC | PRN

## 2019-02-09 MED ORDER — ONDANSETRON HCL 4 MG/2ML IJ SOLN
INTRAMUSCULAR | Status: AC
  Filled 2019-02-09: qty 2

## 2019-02-09 MED ORDER — DEXTROSE 5 % IV SOLN
3.0000 g | Freq: Three times a day (TID) | INTRAVENOUS | Status: AC
  Administered 2019-02-09 – 2019-02-10 (×2): 3 g via INTRAVENOUS
  Filled 2019-02-09 (×4): qty 3000

## 2019-02-09 MED ORDER — PREGABALIN 75 MG PO CAPS
200.0000 mg | ORAL_CAPSULE | Freq: Two times a day (BID) | ORAL | Status: DC
  Administered 2019-02-09 – 2019-02-10 (×2): 200 mg via ORAL
  Filled 2019-02-09 (×2): qty 2

## 2019-02-09 MED ORDER — MENTHOL 3 MG MT LOZG: 1.0000 | LOZENGE | OROMUCOSAL | Status: DC | PRN

## 2019-02-09 MED ORDER — SUGAMMADEX SODIUM 200 MG/2ML IV SOLN
INTRAVENOUS | Status: DC | PRN
  Administered 2019-02-09: 300 mg via INTRAVENOUS

## 2019-02-09 MED ORDER — FENTANYL CITRATE (PF) 250 MCG/5ML IJ SOLN
INTRAMUSCULAR | Status: DC | PRN
  Administered 2019-02-09: 25 ug via INTRAVENOUS
  Administered 2019-02-09: 50 ug via INTRAVENOUS
  Administered 2019-02-09: 25 ug via INTRAVENOUS
  Administered 2019-02-09: 50 ug via INTRAVENOUS
  Administered 2019-02-09: 100 ug via INTRAVENOUS
  Administered 2019-02-09 (×2): 50 ug via INTRAVENOUS

## 2019-02-09 MED ORDER — BUPIVACAINE-EPINEPHRINE 0.5% -1:200000 IJ SOLN
INTRAMUSCULAR | Status: AC
  Filled 2019-02-09: qty 1

## 2019-02-09 MED ORDER — LUBIPROSTONE 24 MCG PO CAPS
48.0000 ug | ORAL_CAPSULE | Freq: Every day | ORAL | Status: DC
  Administered 2019-02-09: 48 ug via ORAL
  Filled 2019-02-09 (×2): qty 2

## 2019-02-09 MED ORDER — SODIUM CHLORIDE 0.9 % IV SOLN
INTRAVENOUS | Status: DC | PRN
  Administered 2019-02-09: 500 mL

## 2019-02-09 MED ORDER — SODIUM CHLORIDE 0.9% FLUSH: 3.0000 mL | Freq: Two times a day (BID) | INTRAVENOUS | Status: DC

## 2019-02-09 MED ORDER — SODIUM CHLORIDE 0.9 % IV SOLN
250.0000 mL | INTRAVENOUS | Status: DC
  Administered 2019-02-09: 250 mL via INTRAVENOUS

## 2019-02-09 MED ORDER — MIDAZOLAM HCL 5 MG/5ML IJ SOLN
INTRAMUSCULAR | Status: DC | PRN
  Administered 2019-02-09: 1 mg via INTRAVENOUS

## 2019-02-09 MED ORDER — GLIPIZIDE 5 MG PO TABS
5.0000 mg | ORAL_TABLET | Freq: Every day | ORAL | Status: DC
  Administered 2019-02-10: 5 mg via ORAL
  Filled 2019-02-09: qty 1

## 2019-02-09 MED ORDER — DULOXETINE HCL 60 MG PO CPEP
60.0000 mg | ORAL_CAPSULE | Freq: Every day | ORAL | Status: DC
  Administered 2019-02-10: 60 mg via ORAL
  Filled 2019-02-09: qty 1

## 2019-02-09 MED ORDER — MORPHINE SULFATE (PF) 4 MG/ML IV SOLN
4.0000 mg | INTRAVENOUS | Status: DC | PRN
  Administered 2019-02-09 – 2019-02-10 (×5): 4 mg via INTRAVENOUS
  Filled 2019-02-09 (×5): qty 1

## 2019-02-09 MED ORDER — ONDANSETRON HCL 4 MG PO TABS: 4.0000 mg | ORAL_TABLET | Freq: Four times a day (QID) | ORAL | Status: DC | PRN

## 2019-02-09 MED ORDER — ROCURONIUM BROMIDE 100 MG/10ML IV SOLN
INTRAVENOUS | Status: DC | PRN
  Administered 2019-02-09 (×3): 10 mg via INTRAVENOUS
  Administered 2019-02-09: 40 mg via INTRAVENOUS

## 2019-02-09 MED ORDER — PROPOFOL 10 MG/ML IV BOLUS
INTRAVENOUS | Status: DC | PRN
  Administered 2019-02-09: 200 mg via INTRAVENOUS

## 2019-02-09 MED ORDER — SUCCINYLCHOLINE CHLORIDE 20 MG/ML IJ SOLN
INTRAMUSCULAR | Status: DC | PRN
  Administered 2019-02-09: 100 mg via INTRAVENOUS

## 2019-02-09 MED ORDER — DEXAMETHASONE SODIUM PHOSPHATE 10 MG/ML IJ SOLN
INTRAMUSCULAR | Status: AC
  Filled 2019-02-09: qty 1

## 2019-02-09 MED ORDER — INSULIN ASPART 100 UNIT/ML ~~LOC~~ SOLN
0.0000 [IU] | SUBCUTANEOUS | Status: DC
  Administered 2019-02-09: 4 [IU] via SUBCUTANEOUS
  Administered 2019-02-10: 7 [IU] via SUBCUTANEOUS

## 2019-02-09 MED ORDER — LACTATED RINGERS IV SOLN: INTRAVENOUS | Status: DC

## 2019-02-09 MED ORDER — ADULT MULTIVITAMIN W/MINERALS CH
1.0000 | ORAL_TABLET | Freq: Every day | ORAL | Status: DC
  Administered 2019-02-10: 09:00:00 1 via ORAL
  Filled 2019-02-09: qty 1

## 2019-02-09 MED ORDER — SUGAMMADEX SODIUM 500 MG/5ML IV SOLN
INTRAVENOUS | Status: AC
  Filled 2019-02-09: qty 5

## 2019-02-09 MED ORDER — BACITRACIN ZINC 500 UNIT/GM EX OINT
TOPICAL_OINTMENT | CUTANEOUS | Status: DC | PRN
  Administered 2019-02-09: 1 via TOPICAL

## 2019-02-09 MED ORDER — BUPIVACAINE-EPINEPHRINE (PF) 0.5% -1:200000 IJ SOLN
INTRAMUSCULAR | Status: DC | PRN
  Administered 2019-02-09: 10 mL via PERINEURAL

## 2019-02-09 MED ORDER — THROMBIN 5000 UNITS EX SOLR
OROMUCOSAL | Status: DC | PRN
  Administered 2019-02-09: 12:00:00 5 mL via TOPICAL

## 2019-02-09 MED ORDER — CYCLOBENZAPRINE HCL 10 MG PO TABS
10.0000 mg | ORAL_TABLET | Freq: Three times a day (TID) | ORAL | Status: DC | PRN
  Administered 2019-02-10: 10 mg via ORAL
  Filled 2019-02-09: qty 1

## 2019-02-09 MED ORDER — ROCURONIUM BROMIDE 10 MG/ML (PF) SYRINGE
PREFILLED_SYRINGE | INTRAVENOUS | Status: AC
  Filled 2019-02-09: qty 10

## 2019-02-09 MED ORDER — EPHEDRINE 5 MG/ML INJ
INTRAVENOUS | Status: AC
  Filled 2019-02-09: qty 10

## 2019-02-09 MED ORDER — METFORMIN HCL ER 500 MG PO TB24
1000.0000 mg | ORAL_TABLET | Freq: Two times a day (BID) | ORAL | Status: DC
  Administered 2019-02-10: 1000 mg via ORAL
  Filled 2019-02-09 (×2): qty 2

## 2019-02-09 MED ORDER — BACITRACIN ZINC 500 UNIT/GM EX OINT
TOPICAL_OINTMENT | CUTANEOUS | Status: AC
  Filled 2019-02-09: qty 28.35

## 2019-02-09 MED ORDER — BISACODYL 10 MG RE SUPP: 10.0000 mg | Freq: Every day | RECTAL | Status: DC | PRN

## 2019-02-09 MED ORDER — LIDOCAINE 2% (20 MG/ML) 5 ML SYRINGE
INTRAMUSCULAR | Status: DC | PRN
  Administered 2019-02-09: 60 mg via INTRAVENOUS

## 2019-02-09 MED ORDER — SODIUM CHLORIDE 0.9% FLUSH: 3.0000 mL | INTRAVENOUS | Status: DC | PRN

## 2019-02-09 MED ORDER — PHENYLEPHRINE HCL-NACL 10-0.9 MG/250ML-% IV SOLN
INTRAVENOUS | Status: DC | PRN
  Administered 2019-02-09: 30 ug/min via INTRAVENOUS

## 2019-02-09 MED ORDER — DILTIAZEM HCL ER COATED BEADS 180 MG PO CP24
180.0000 mg | ORAL_CAPSULE | Freq: Every day | ORAL | Status: DC
  Administered 2019-02-10: 180 mg via ORAL
  Filled 2019-02-09 (×2): qty 1

## 2019-02-09 MED ORDER — CARVEDILOL 6.25 MG PO TABS
6.2500 mg | ORAL_TABLET | Freq: Two times a day (BID) | ORAL | Status: DC
  Administered 2019-02-09 – 2019-02-10 (×2): 6.25 mg via ORAL
  Filled 2019-02-09 (×2): qty 1

## 2019-02-09 MED ORDER — ATORVASTATIN CALCIUM 80 MG PO TABS: 80.0000 mg | ORAL_TABLET | Freq: Every day | ORAL | Status: DC

## 2019-02-09 MED ORDER — PANTOPRAZOLE SODIUM 40 MG PO TBEC
80.0000 mg | DELAYED_RELEASE_TABLET | Freq: Every day | ORAL | Status: DC
  Administered 2019-02-10: 80 mg via ORAL
  Filled 2019-02-09: qty 2

## 2019-02-09 MED ORDER — CHLORTHALIDONE 25 MG PO TABS
25.0000 mg | ORAL_TABLET | Freq: Every day | ORAL | Status: DC
  Filled 2019-02-09: qty 1

## 2019-02-09 MED ORDER — THROMBIN 5000 UNITS EX SOLR
CUTANEOUS | Status: AC
  Filled 2019-02-09: qty 5000

## 2019-02-09 MED ORDER — PROPOFOL 10 MG/ML IV BOLUS
INTRAVENOUS | Status: AC
  Filled 2019-02-09: qty 20

## 2019-02-09 MED ORDER — ONDANSETRON HCL 4 MG/2ML IJ SOLN: 4.0000 mg | Freq: Once | INTRAMUSCULAR | Status: DC | PRN

## 2019-02-09 MED ORDER — MIDAZOLAM HCL 2 MG/2ML IJ SOLN
INTRAMUSCULAR | Status: AC
  Filled 2019-02-09: qty 2

## 2019-02-09 MED ORDER — OXYCODONE HCL 5 MG PO TABS
ORAL_TABLET | ORAL | Status: AC
  Filled 2019-02-09: qty 4

## 2019-02-09 MED ORDER — ONDANSETRON HCL 4 MG/2ML IJ SOLN
INTRAMUSCULAR | Status: DC | PRN
  Administered 2019-02-09: 4 mg via INTRAVENOUS

## 2019-02-09 MED ORDER — DEXAMETHASONE SODIUM PHOSPHATE 10 MG/ML IJ SOLN
INTRAMUSCULAR | Status: DC | PRN
  Administered 2019-02-09: 5 mg via INTRAVENOUS

## 2019-02-09 MED ORDER — LISINOPRIL 20 MG PO TABS
20.0000 mg | ORAL_TABLET | Freq: Every day | ORAL | Status: DC
  Administered 2019-02-09: 20 mg via ORAL
  Filled 2019-02-09: qty 1

## 2019-02-09 MED ORDER — PHENYLEPHRINE 40 MCG/ML (10ML) SYRINGE FOR IV PUSH (FOR BLOOD PRESSURE SUPPORT)
PREFILLED_SYRINGE | INTRAVENOUS | Status: DC | PRN
  Administered 2019-02-09 (×5): 80 ug via INTRAVENOUS

## 2019-02-09 MED ORDER — POTASSIUM CHLORIDE CRYS ER 20 MEQ PO TBCR
20.0000 meq | EXTENDED_RELEASE_TABLET | Freq: Every day | ORAL | Status: DC
  Administered 2019-02-10: 09:00:00 20 meq via ORAL
  Filled 2019-02-09: qty 1

## 2019-02-09 MED ORDER — SCOPOLAMINE 1 MG/3DAYS TD PT72
MEDICATED_PATCH | TRANSDERMAL | Status: AC
  Filled 2019-02-09: qty 1

## 2019-02-09 MED ORDER — NITROGLYCERIN 0.4 MG SL SUBL: 0.4000 mg | SUBLINGUAL_TABLET | SUBLINGUAL | Status: DC | PRN

## 2019-02-09 MED ORDER — ACETAMINOPHEN 650 MG RE SUPP: 650.0000 mg | RECTAL | Status: DC | PRN

## 2019-02-09 SURGICAL SUPPLY — 71 items
ADH SKN CLS APL DERMABOND .7 (GAUZE/BANDAGES/DRESSINGS)
ANCH LD 4 SETX2 CLIK X (Stimulator) ×1 IMPLANT
ANCHOR CLIK X NEURO (Stimulator) ×2 IMPLANT
APL SKNCLS STERI-STRIP NONHPOA (GAUZE/BANDAGES/DRESSINGS) ×1
BAG DECANTER FOR FLEXI CONT (MISCELLANEOUS) ×3 IMPLANT
BENZOIN TINCTURE PRP APPL 2/3 (GAUZE/BANDAGES/DRESSINGS) ×2 IMPLANT
BLADE CLIPPER SURG (BLADE) IMPLANT
BUR MATCHSTICK NEURO 3.0 LAGG (BURR) ×2 IMPLANT
BUR PRECISION FLUTE 6.0 (BURR) ×3 IMPLANT
CARTRIDGE OIL MAESTRO DRILL (MISCELLANEOUS) ×1 IMPLANT
CLOSURE WOUND 1/2 X4 (GAUZE/BANDAGES/DRESSINGS) ×3
COVER WAND RF STERILE (DRAPES) ×1 IMPLANT
DECANTER SPIKE VIAL GLASS SM (MISCELLANEOUS) ×2 IMPLANT
DERMABOND ADVANCED (GAUZE/BANDAGES/DRESSINGS)
DERMABOND ADVANCED .7 DNX12 (GAUZE/BANDAGES/DRESSINGS) IMPLANT
DIFFUSER DRILL AIR PNEUMATIC (MISCELLANEOUS) ×3 IMPLANT
DRAPE C-ARM 42X72 X-RAY (DRAPES) ×7 IMPLANT
DRAPE INCISE IOBAN 85X60 (DRAPES) IMPLANT
DRAPE LAPAROTOMY 100X72X124 (DRAPES) ×3 IMPLANT
DRAPE POUCH INSTRU U-SHP 10X18 (DRAPES) ×1 IMPLANT
DRAPE SURG 17X23 STRL (DRAPES) ×12 IMPLANT
DRSG OPSITE POSTOP 4X6 (GAUZE/BANDAGES/DRESSINGS) ×6 IMPLANT
ELECT BLADE 4.0 EZ CLEAN MEGAD (MISCELLANEOUS) ×3
ELECT REM PT RETURN 9FT ADLT (ELECTROSURGICAL) ×3
ELECTRODE BLDE 4.0 EZ CLN MEGD (MISCELLANEOUS) IMPLANT
ELECTRODE REM PT RTRN 9FT ADLT (ELECTROSURGICAL) ×1 IMPLANT
ELEVATER PASSER (SPINAL CORD STIMULATOR) ×3
GAUZE 4X4 16PLY RFD (DISPOSABLE) IMPLANT
GENERATOR NEUROSTIMULATOR (Neurostimulator) ×2 IMPLANT
GLOVE BIO SURGEON STRL SZ7 (GLOVE) ×2 IMPLANT
GLOVE BIO SURGEON STRL SZ8 (GLOVE) ×3 IMPLANT
GLOVE BIO SURGEON STRL SZ8.5 (GLOVE) ×3 IMPLANT
GLOVE BIOGEL PI IND STRL 6.5 (GLOVE) IMPLANT
GLOVE BIOGEL PI INDICATOR 6.5 (GLOVE) ×8
GLOVE EXAM NITRILE XL STR (GLOVE) IMPLANT
GLOVE SURG SS PI 6.0 STRL IVOR (GLOVE) ×8 IMPLANT
GOWN STRL REUS W/ TWL LRG LVL3 (GOWN DISPOSABLE) IMPLANT
GOWN STRL REUS W/ TWL XL LVL3 (GOWN DISPOSABLE) IMPLANT
GOWN STRL REUS W/TWL 2XL LVL3 (GOWN DISPOSABLE) IMPLANT
GOWN STRL REUS W/TWL LRG LVL3 (GOWN DISPOSABLE) ×12
GOWN STRL REUS W/TWL XL LVL3 (GOWN DISPOSABLE) ×3
HEMOSTAT POWDER SURGIFOAM 1G (HEMOSTASIS) ×2 IMPLANT
KIT BASIN OR (CUSTOM PROCEDURE TRAY) ×3 IMPLANT
KIT CHARGING (KITS) ×2
KIT CHARGING PRECISION NEURO (KITS) IMPLANT
KIT REMOTE CONTROL 112802 FREE (KITS) ×2 IMPLANT
KIT TURNOVER KIT B (KITS) ×3 IMPLANT
LEAD PADDLE COVEREDGE 70CM (Lead) ×2 IMPLANT
NEEDLE HYPO 22GX1.5 SAFETY (NEEDLE) ×3 IMPLANT
NS IRRIG 1000ML POUR BTL (IV SOLUTION) ×3 IMPLANT
OIL CARTRIDGE MAESTRO DRILL (MISCELLANEOUS) ×3
PACK LAMINECTOMY NEURO (CUSTOM PROCEDURE TRAY) ×3 IMPLANT
PAD ARMBOARD 7.5X6 YLW CONV (MISCELLANEOUS) ×13 IMPLANT
PASSER ELEVATOR (SPINAL CORD STIMULATOR) IMPLANT
PATTIES SURGICAL .5 X1 (DISPOSABLE) ×1 IMPLANT
SPONGE LAP 4X18 RFD (DISPOSABLE) IMPLANT
SPONGE SURGIFOAM ABS GEL SZ50 (HEMOSTASIS) ×1 IMPLANT
STAPLER SKIN PROX WIDE 3.9 (STAPLE) ×1 IMPLANT
STRIP CLOSURE SKIN 1/2X4 (GAUZE/BANDAGES/DRESSINGS) ×3 IMPLANT
SUT SILK 0 TIES 10X30 (SUTURE) IMPLANT
SUT SILK 2 0 PERMA HAND 18 BK (SUTURE) ×5 IMPLANT
SUT SILK 2 0 TIES 10X30 (SUTURE) ×3 IMPLANT
SUT VIC AB 1 CT1 18XBRD ANBCTR (SUTURE) ×1 IMPLANT
SUT VIC AB 1 CT1 8-18 (SUTURE) ×6
SUT VIC AB 2-0 CP2 18 (SUTURE) ×7 IMPLANT
SUT VIC AB 2-0 CT2 18 VCP726D (SUTURE) ×2 IMPLANT
TOOL LONG TUNNEL (SPINAL CORD STIMULATOR) ×2 IMPLANT
TOWEL GREEN STERILE (TOWEL DISPOSABLE) ×3 IMPLANT
TOWEL GREEN STERILE FF (TOWEL DISPOSABLE) ×3 IMPLANT
WATER STERILE IRR 1000ML POUR (IV SOLUTION) ×3 IMPLANT
WRENCH HEX 7.6 (SPINAL CORD STIMULATOR) ×2 IMPLANT

## 2019-02-09 NOTE — Transfer of Care (Signed)
Immediate Anesthesia Transfer of Care Note  Patient: Thomas Mcclure  Procedure(s) Performed: REPLACEMENT OF LUMBAR SPINAL CORD STIMULATOR (N/A Thoracic)  Patient Location: PACU  Anesthesia Type:General  Level of Consciousness: drowsy  Airway & Oxygen Therapy: Patient Spontanous Breathing and Patient connected to face mask oxygen  Post-op Assessment: Report given to RN and Post -op Vital signs reviewed and stable  Post vital signs: Reviewed  Last Vitals:  Vitals Value Taken Time  BP 120/84 02/09/19 1408  Temp    Pulse 97 02/09/19 1411  Resp 14 02/09/19 1411  SpO2 94 % 02/09/19 1411  Vitals shown include unvalidated device data.  Last Pain:  Vitals:   02/09/19 0940  TempSrc:   PainSc: 7       Patients Stated Pain Goal: 5 ([id number])  Complications: No apparent anesthesia complications

## 2019-02-09 NOTE — Progress Notes (Signed)
PT Cancellation Note  Patient Details Name: Thomas Mcclure MRN: [id number] DOB: 02/23/1956   Cancelled Treatment:    Reason Eval/Treat Not Completed: Other (comment) PT orders received and chart reviewed.  At arrival , pt had just received his dinner and declined PT at this time.  Reports he is doing well and would like therapy to come early tomorrow so that he can go home.  Maggie Font, PT Acute Rehab Services Pager [phone] [facility] [phone]    Karlton Lemon 02/09/2019, 5:28 PM

## 2019-02-09 NOTE — Anesthesia Procedure Notes (Addendum)
Procedure Name: Intubation Date/Time: 02/09/2019 11:12 AM Performed by: Janene Harvey, CRNA Pre-anesthesia Checklist: Patient identified, Emergency Drugs available, Suction available and Patient being monitored Patient Re-evaluated:Patient Re-evaluated prior to induction Oxygen Delivery Method: Circle system utilized Preoxygenation: Pre-oxygenation with 100% oxygen Induction Type: IV induction Ventilation: Mask ventilation without difficulty and Oral airway inserted - appropriate to patient size Laryngoscope Size: Mac and 4 Grade View: Grade II Tube type: Oral Tube size: 7.5 mm Number of attempts: 1 Airway Equipment and Method: Stylet and Oral airway Placement Confirmation: ETT inserted through vocal cords under direct vision,  positive ETCO2 and breath sounds checked- equal and bilateral Secured at: 23 cm Tube secured with: Tape Dental Injury: Teeth and Oropharynx as per pre-operative assessment

## 2019-02-09 NOTE — Op Note (Signed)
Brief history: The patient is a 63 year old black male who is had multiple previous back surgeries.  He has had a spinal cord stimulator placed by another physician about 6 years ago.  Usually he was pleased with a spinal cord stimulator.  After his last revision of his fusion, i.e. an extension up to T10, spinal cord stimulator malfunction.  Work-up demonstrated the leads had migrated caudally.  I discussed the various treatment options with the patient and his wife.  He has weighed the risks, benefits and alternatives surgery and decided proceed with a removal and replacement of his spinal cord stimulator.  Preop diagnosis: Chronic back pain  Postop diagnosis: The same  Procedure: Removal of spinal cord stimulator; insertion of [employer] spinal cord stimulator generator and leads  Surgeon: Dr. Earle Gell  Assistant: Arnetha Massy nurse practitioner  Anesthesia: General tracheal  Estimated blood loss: Minimal  Specimens: None  Drains: None  Complications: None  Description of procedure: The patient was brought to the operating room by the anesthesia team.  General endotracheal anesthesia was induced.  He was turned to the prone position on the chest rolls.  His thoracolumbar region was then prepared with Betadine scrub and Betadine solution.  Sterile drapes were applied.  I then injected the areas to be incised with Marcaine with epinephrine solution.  A scalpel to make an incision over the patient's spinal cord stimulator generator on the right side, incising through the old surgical scar.  I used electrocautery to expose the old spinal cord stimulator and the wires.  We freed up the stimulator wires with electrocautery.  I then made a second incision over the insertion site of the spinal cord stimulator leads into the spine and the lower thoracic region.  I then exposed the fasteners that secured the leads with electrocautery.  I then remove the fasteners, the leads, and the  stimulator.  We now turned our attention to insertion of the new stimulator.  I another incision at the T9-10 interspace.  I used electrocautery to perform a right subperiosteal dissection exposing the right T9 and T10 lamina.  We confirmed our location with intraoperative fluoroscopy.  I inserted the McCullough retractor for exposure.  I used the high-speed drill to create a right T9-10 laminotomy.  I remove the ligamentum flavum with a Kerrison punches and widen the laminotomy with a Kerrison punches.  Carefully dissected away the epidural fat from the dura.  I then used the trial lead and passed it through the laminotomy into the epidural space posteriorly without any resistance.  I then remove the trial or and inserted the paddle lead into the posterior epidural space under fluoroscopic guidance to the T7 level.  After some adjustment we got in the midline.  I then secured the leads to the interspinous ligament with a fastener and a 0 Vicryl suture.  We then used the shunt passer to pass the distal end of the leads to the generator pocket.  I secured the leads to the generator.  We confirmed the unit was working properly using telemetry.  I then coiled the excess wires behind the generator and inserted the generator into the subcutaneous pocket.  We then remove the retractors and reapproximated the patient's thoracic fascia with interrupted 0 Vicryl suture.  We reapproximated the subcutaneous tissue in all 3 incisions with the 2-0 Vicryl suture.  We reapproximated the skin with Steri-Strips and benzoin.  We coated the wounds with bacitracin ointment.  A sterile dressing was applied.  The  drapes were removed.  By report all sponge, instrument, and needle counts were correct at the end of this case.  Once again telemetry confirmed good functioning of the spinal cord stimulator.

## 2019-02-09 NOTE — Progress Notes (Signed)
Silver wedding band given to patient's spouse, Brevin Durkee.

## 2019-02-09 NOTE — Anesthesia Postprocedure Evaluation (Signed)
Anesthesia Post Note  Patient: Thomas Mcclure  Procedure(s) Performed: REPLACEMENT OF LUMBAR SPINAL CORD STIMULATOR (N/A Thoracic)     Patient location during evaluation: PACU Anesthesia Type: General Level of consciousness: awake and alert Pain management: pain level controlled Vital Signs Assessment: post-procedure vital signs reviewed and stable Respiratory status: spontaneous breathing, nonlabored ventilation, respiratory function stable and patient connected to nasal cannula oxygen Cardiovascular status: blood pressure returned to baseline and stable Postop Assessment: no apparent nausea or vomiting Anesthetic complications: no    Last Vitals:  Vitals:   02/09/19 1535 02/09/19 1624  BP: (!) 121/98 139/88  Pulse: 90 94  Resp: (!) 2 18  Temp: 36.5 C 36.8 C  SpO2: 94% 90%    Last Pain:  Vitals:   02/09/19 2032  TempSrc:   PainSc: 8                  Tiajuana Amass

## 2019-02-09 NOTE — H&P (Signed)
Subjective: The patient is a 63 year old obese black male who is had multiple back surgeries.  He had a spinal cord stimulator placed about 8 years ago which has provided good relief.  He had a revision/extension of his spinal fusion a few months ago.  Since then his spinal cord similar has not worked properly.  He was worked up with x-rays which demonstrated his leads had migrated caudally.  I discussed the various treatment options with him.  We discussed the placement of a spinal cord stimulator and leads.  He has weighed the risks, benefits and alternatives and decided to proceed with surgery.  Past Medical History:  Diagnosis Date  . Baker's cyst    Left calf  . CAD in native artery, 07/15/15 PCI of RCA with DES 07/16/2015   a.   NSTEMI 5/17: LHC - pLAD 20, pLCx 20, OM1 30, pRCA 100, EF 45-50%>> PCI: 2.5 x 24 mm Promus DES to RCA  //  b.   Echo 5/17: mild LVH, EF 50-55%, no RWMA, mod RVE //  c. LHC 6/17: pLAD 20, pLCx 20, OM1 50, pRCA stent ok, EF 35-45% with mod sized inf wall and basal segment aneurysm  . Chronic back pain    "down my back, down my legs" (02/18/2017)  . Constipation   . Constipation due to opioid therapy   . GERD (gastroesophageal reflux disease)   . Hyperlipidemia   . Hypertension    Dr. [name] [phone]  . Ischemic cardiomyopathy    a. LV-gram at time of LHC in 6/17 with EF 35-45%  //  b. Echo 7/17: EF 45-50%, inferior HK, grade 1 diastolic dysfunction, mildly dilated aortic root, moderately reduced RVSF, mild RAE  . Myocardial infarction ([place]) 2017  . 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 Numbness and tingling of both lower extremities    "on the outside of both sides" (02/18/2017)  . Rheumatoid arthritis ([place])   . Tachycardia   . Type II diabetes mellitus ([place])    diet controlled    Past Surgical History:  Procedure Laterality Date  . BACK SURGERY     x3  . CARDIAC CATHETERIZATION N/A 07/15/2015   Procedure: Left Heart Cath and  Coronary Angiography;  Surgeon: [name], MD;  Location: [facility];  Service: Cardiovascular;  Laterality: N/A;  . CARDIAC CATHETERIZATION N/A 07/15/2015   Procedure: Coronary Stent Intervention;  Surgeon: [name], MD;  Location: [facility];  Service: Cardiovascular;  Laterality: N/A;  . CARDIAC CATHETERIZATION N/A 08/07/2015   Procedure: Left Heart Cath and Coronary Angiography;  Surgeon: [name], MD;  Location: [facility];  Service: Cardiovascular;  Laterality: N/A;  . CORONARY ANGIOPLASTY WITH STENT PLACEMENT  07/2015  . LEFT HEART CATH AND CORONARY ANGIOGRAPHY N/A 02/19/2017   Procedure: LEFT HEART CATH AND CORONARY ANGIOGRAPHY;  Surgeon: Martinique, Peter M, MD;  Location: [facility];  Service: Cardiovascular;  Laterality: N/A;  . LUMBAR SPINE SURGERY  03/2009  & 2012  . MASS EXCISION N/A 04/19/2012   Procedure: removal of posterior cervical lipoma;  Surgeon: [name], MD;  Location: [facility];  Service: Neurosurgery;  Laterality: N/A;  Removal of posterior cervical lipoma  . POPLITEAL SYNOVIAL CYST EXCISION Left    "opened up behind my knee; scraped out arthritis"  . POSTERIOR LUMBAR FUSION 4 LEVEL N/A 11/22/2018   Procedure: Posterior Lateral and Interbody fusion - Lumbar one-Lumbar two; explore  fusion, posterior instrumentation and fusion Thoracic ten to the ilium;  Surgeon: Newman Pies, MD;  Location: [facility];  Service: Neurosurgery;  Laterality: N/A;  . SPINAL CORD STIMULATOR INSERTION N/A 01/07/2013   Procedure:  SPINAL CORD STIMULATOR INSERTION;  Surgeon: [name], MD;  Location: [facility];  Service: Neurosurgery;  Laterality: N/A;  . TONSILLECTOMY     patient denies  . TOTAL HIP ARTHROPLASTY Right 10/03/2016   Procedure: TOTAL HIP ARTHROPLASTY;  Surgeon: [name], MD;  Location: [facility];  Service: Orthopedics;  Laterality: Right;  . WISDOM TOOTH EXTRACTION     hx of    Allergies  Allergen Reactions  .  Brilinta [Ticagrelor] Shortness Of Breath and Other (See Comments)    CHEST PAIN   . Methylprednisolone Other (See Comments)    DIAPHORESIS, HYPOTENSION  . Prednisone Other (See Comments)    DIAPHORESIS, HYPOTENSION  . Toradol [Ketorolac Tromethamine] Other (See Comments)    DIAPHORESIS, HYPOTENSION    . Adhesive [Tape] Rash and Other (See Comments)    "blisters" ( NO SURGICAL TAPE, PLEASE)    Social History   Tobacco Use  . Smoking status: Former Smoker    Packs/day: 0.25    Years: 40.00    Pack years: 10.00    Types: Cigarettes    Quit date: 2008    Years since quitting: 12.9  . Smokeless tobacco: Never Used  . Tobacco comment: 02/18/2017 "quit in 2012"  Substance Use Topics  . Alcohol use: No    Alcohol/week: 0.0 standard drinks    Family History  Problem Relation Age of Onset  . Heart disease Mother   . Prostate cancer Brother    Prior to Admission medications   Medication Sig Start Date End Date Taking? Authorizing Provider  acetaminophen (TYLENOL) 500 MG tablet Take 1,000 mg by mouth 2 (two) times daily as needed (pain.).   Yes [provider]  aspirin EC 81 MG tablet Take 1 tablet (81 mg total) by mouth daily. 07/18/15  Yes Burns, Arloa Koh, MD  atorvastatin (LIPITOR) 80 MG tablet Take 80 mg by mouth daily at 6 PM.   Yes [provider]  carvedilol (COREG) 6.25 MG tablet Take 1 tablet (6.25 mg total) by mouth 2 (two) times daily. 01/10/19  Yes [name], MD  chlorthalidone (HYGROTON) 25 MG tablet Take 1 tablet (25 mg total) by mouth daily. 05/27/16  Yes Burtis Junes, NP  cyclobenzaprine (FLEXERIL) 10 MG tablet TAKE 1 TABLET(10 MG) BY MOUTH DAILY AS NEEDED FOR MUSCLE SPASMS Patient taking differently: Take 10 mg by mouth 3 (three) times daily as needed for muscle spasms.  02/16/18  Yes Marrian Salvage, FNP  diclofenac sodium (VOLTAREN) 1 % GEL Apply 1 application topically 4 (four) times daily as needed (pain.).  10/04/18  Yes [provider]  DILT-XR 180 MG 24 hr capsule Take 180 mg by mouth daily.  04/09/16  Yes [provider]  DULoxetine (CYMBALTA) 60 MG capsule Take 1 capsule (60 mg total) by mouth daily. 11/06/17  Yes Marrian Salvage, FNP  glipiZIDE (GLUCOTROL) 5 MG tablet Take 5 mg by mouth daily before breakfast.    Yes [provider]  lisinopril (PRINIVIL,ZESTRIL) 20 MG tablet Take 20 mg by mouth at bedtime.  07/07/16  Yes [provider]  lubiprostone (AMITIZA) 24 MCG capsule Take 48 mcg by mouth at bedtime.    Yes [provider]  metFORMIN (GLUCOPHAGE XR) 500 MG 24 hr tablet  Take 2 tablets (1,000 mg total) by mouth 2 (two) times daily. 11/09/17  Yes Marrian Salvage, FNP  Multiple Vitamin (MULTIVITAMIN WITH MINERALS) TABS tablet Take 1 tablet by mouth daily.   Yes [provider]  omeprazole (PRILOSEC) 40 MG capsule Take 1 capsule (40 mg total) by mouth daily. 11/06/17  Yes Marrian Salvage, FNP  Oxycodone HCl 20 MG TABS Take 1 tablet by mouth 5 (five) times daily.  01/03/19  Yes [provider]  polyethylene glycol powder (GLYCOLAX/MIRALAX) powder Take 17 g by mouth daily. Mix in 8 oz water, juice, soda, coffee or tea and drink Patient taking differently: Take 17 g by mouth every other day. Mix in 8 oz water, juice, soda, coffee or tea and drink 11/06/17  Yes Marrian Salvage, FNP  potassium chloride SA (K-DUR,KLOR-CON) 20 MEQ tablet Take 1 tablet (20 mEq total) by mouth daily. 05/24/18  Yes [name], MD  pregabalin (LYRICA) 200 MG capsule Take 1 capsule (200 mg total) by mouth 2 (two) times daily. 08/02/18  Yes Bayard Hugger, NP  QC LIDOCAINE PAIN RELIEF EX Place 1 patch onto the skin daily as needed (pain.).   Yes [provider]  senna-docusate (SENOKOT S) 8.6-50 MG tablet Take 1 tablet by mouth 2 (two) times daily. Patient taking differently: Take 1-2 tablets by mouth See admin instructions. 1 tab midday; 2 tab at  bedtime 05/31/17  Yes Eugenie Filler, MD  nitroGLYCERIN (NITROSTAT) 0.4 MG SL tablet Place 1 tablet (0.4 mg total) under the tongue every 5 (five) minutes as needed for chest pain. 04/26/18   Isaiah Serge, NP     Review of Systems  Positive ROS: As above  All other systems have been reviewed and were otherwise negative with the exception of those mentioned in the HPI and as above.  Objective: Vital signs in last 24 hours: Temp:  [98.6 F (37 C)] 98.6 F (37 C) (12/16 0926) Pulse Rate:  [93] 93 (12/16 0921) Resp:  [18] 18 (12/16 0921) BP: (143-173)/(96-112) 143/96 (12/16 0926) SpO2:  [98 %] 98 % (12/16 0921) Weight:  [117.9 kg-120.2 kg] 120.2 kg (12/16 0921) Estimated body mass index is 39.13 kg/m as calculated from the following:   Height as of this encounter: 5\' 9"  (1.753 m).   Weight as of this encounter: 120.2 kg.   General Appearance: Alert Head: Normocephalic, without obvious abnormality, atraumatic Eyes: PERRL, conjunctiva/corneas clear, EOM's intact,    Ears: Normal  Throat: Normal  Neck: Supple, Back: The patient's thoracolumbar incision is well-healed. Lungs: Clear to auscultation bilaterally, respirations unlabored Heart: Regular rate and rhythm, no murmur, rub or gallop Abdomen: Soft, non-tender Extremities: Extremities normal, atraumatic, no cyanosis or edema Skin: unremarkable  NEUROLOGIC:   Mental status: alert and oriented,Motor Exam - grossly normal Sensory Exam - grossly normal Reflexes:  Coordination - grossly normal Gait - grossly normal Balance - grossly normal Cranial Nerves: I: smell Not tested  II: visual acuity  OS: Normal  OD: Normal   II: visual fields Full to confrontation  II: pupils Equal, round, reactive to light  III,VII: ptosis None  III,IV,VI: extraocular muscles  Full ROM  V: mastication Normal  V: facial light touch sensation  Normal  V,VII: corneal reflex  Present  VII: facial muscle function - upper  Normal  VII: facial  muscle function - lower Normal  VIII: hearing Not tested  IX: soft palate elevation  Normal  IX,X: gag reflex Present  XI: trapezius  strength  5/5  XI: sternocleidomastoid strength 5/5  XI: neck flexion strength  5/5  XII: tongue strength  Normal    Data Review Lab Results  Component Value Date   WBC 13.6 (H) 11/24/2018   HGB 9.9 (L) 11/24/2018   HCT 29.7 (L) 11/24/2018   MCV 82.0 11/24/2018   PLT 232 11/24/2018   Lab Results  Component Value Date   NA 138 11/23/2018   K 3.8 11/23/2018   CL 100 11/23/2018   CO2 26 11/23/2018   BUN 17 11/23/2018   CREATININE 1.17 11/23/2018   GLUCOSE 202 (H) 11/23/2018   Lab Results  Component Value Date   INR 0.96 02/18/2017    Assessment/Plan: Spinal cord stimulator malfunction: I have discussed the situation with the patient.  We have discussed the various treatment options including a replacement of his spinal cord stimulator and leads.  I have described the surgery to him.  We have discussed the risks, benefits, alternatives, expected postoperative course, and likelihood of achieving our goals with surgery.  He has decided to proceed with surgery.   [name] 02/09/2019 10:09 AM

## 2019-02-09 NOTE — Progress Notes (Signed)
Patient arrived to [phone]. Alert and oriented x4. Complains of pain in lower back rating it an 8/10. POC provided to patient. Call bell within reach.

## 2019-02-09 NOTE — Anesthesia Preprocedure Evaluation (Signed)
Anesthesia Evaluation  Patient identified by MRN, date of birth, ID band Patient awake    Reviewed: Allergy & Precautions, NPO status , Patient's Chart, lab work & pertinent test results, reviewed documented beta blocker date and time   Airway Mallampati: II  TM Distance: >3 FB Neck ROM: Full    Dental  (+) Missing, Dental Advisory Given   Pulmonary former smoker,    breath sounds clear to auscultation       Cardiovascular hypertension, Pt. on medications and Pt. on home beta blockers + angina + CAD and + Past MI   Rhythm:Regular Rate:Normal     Neuro/Psych    GI/Hepatic Neg liver ROS, GERD  ,  Endo/Other  diabetes  Renal/GU      Musculoskeletal   Abdominal   Peds  Hematology   Anesthesia Other Findings   Reproductive/Obstetrics                             Lab Results  Component Value Date   WBC 13.6 (H) 11/24/2018   HGB 9.9 (L) 11/24/2018   HCT 29.7 (L) 11/24/2018   MCV 82.0 11/24/2018   PLT 232 11/24/2018   Lab Results  Component Value Date   CREATININE 1.17 11/23/2018   BUN 17 11/23/2018   NA 138 11/23/2018   K 3.8 11/23/2018   CL 100 11/23/2018   CO2 26 11/23/2018    Anesthesia Physical  Anesthesia Plan  ASA: III  Anesthesia Plan: General   Post-op Pain Management:    Induction:   PONV Risk Score and Plan: 2 and Ondansetron, Dexamethasone and Midazolam  Airway Management Planned: Oral ETT  Additional Equipment:   Intra-op Plan:   Post-operative Plan: Possible Post-op intubation/ventilation  Informed Consent: I have reviewed the patients History and Physical, chart, labs and discussed the procedure including the risks, benefits and alternatives for the proposed anesthesia with the patient or authorized representative who has indicated his/her understanding and acceptance.     Dental advisory given  Plan Discussed with: CRNA  Anesthesia Plan Comments:  (Follows with cardiology for hx of CAD with prior NSTEMI in 2017 with DES to the RCA - complicated by LV aneurysm, combined systolic/diastolic HF.  Clearance per telephone encounter 11/08/18: "Thomas Mcclure was last seen on 10/06/18 by Truitt Merle, NP and evaluated for some chest pain, no ischemia on stress test and echo with minimal decrease in Ef to 45-50%.  Pt does have a hx of CAD with pror NSTEMI in 2017 and DES to RCA, LV aneurysm.  Last cath 2018 with patent stent.  Since that day, Thomas Mcclure has done well with no further chest pain. Therefore, based on ACC/AHA guidelines, the patient would be at acceptable risk for the planned procedure without further cardiovascular testing.  OK to hold asprin for procedure and resume after when stable."  TTE 10/15/18: 1. The left ventricle has mildly reduced systolic function, with an ejection fraction of 45-50%. The cavity size was normal. There is mildly increased left ventricular wall thickness. Left ventricular diastolic Doppler parameters are consistent with  impaired relaxation.  2. Abnormal septal motion inferior basal hypokinesis.  3. The right ventricle has normal systolic function. The cavity was normal. There is no increase in right ventricular wall thickness.  4. Mild thickening of the mitral valve leaflet. Mild calcification of the mitral valve leaflet. There is mild mitral annular calcification present.  5. The aortic valve is tricuspid. Mild  thickening of the aortic valve. Sclerosis without any evidence of stenosis of the aortic valve.  6. The aorta is normal unless otherwise noted.  7. The interatrial septum was not well visualized.)        Anesthesia Quick Evaluation

## 2019-02-10 ENCOUNTER — Encounter: Payer: Self-pay | Admitting: *Deleted

## 2019-02-10 DIAGNOSIS — T85890A Other specified complication of nervous system prosthetic devices, implants and grafts, initial encounter: Secondary | ICD-10-CM | POA: Diagnosis not present

## 2019-02-10 LAB — GLUCOSE, CAPILLARY
Glucose-Capillary: 163 mg/dL — ABNORMAL HIGH (ref 70–99)
Glucose-Capillary: 190 mg/dL — ABNORMAL HIGH (ref 70–99)
Glucose-Capillary: 229 mg/dL — ABNORMAL HIGH (ref 70–99)
Glucose-Capillary: 410 mg/dL — ABNORMAL HIGH (ref 70–99)

## 2019-02-10 MED ORDER — INFLUENZA VAC SPLIT QUAD 0.5 ML IM SUSY: 0.5000 mL | PREFILLED_SYRINGE | INTRAMUSCULAR | Status: DC

## 2019-02-10 MED ORDER — CYCLOBENZAPRINE HCL 10 MG PO TABS: 10.0000 mg | ORAL_TABLET | Freq: Three times a day (TID) | ORAL | 0 refills | Status: DC | PRN

## 2019-02-10 MED ORDER — DOCUSATE SODIUM 100 MG PO CAPS: 100.0000 mg | ORAL_CAPSULE | Freq: Two times a day (BID) | ORAL | 0 refills | Status: AC

## 2019-02-10 MED ORDER — OXYCODONE HCL 20 MG PO TABS: 1.0000 | ORAL_TABLET | Freq: Every day | ORAL | 0 refills | Status: DC

## 2019-02-10 NOTE — Progress Notes (Signed)
Patient discharging home. Wife called for transportation home. All discharge instructions went over with patient. All questions and concerns addressed. IV morphine given before discharge home. Patient taken down in wheelchair. [place]

## 2019-02-10 NOTE — Discharge Instructions (Signed)
Wound Care Keep incision covered and dry for two days.    Do not put any creams, lotions, or ointments on incision. Leave steri-strips on back.  They will fall off by themselves. Activity Walk each and every day, increasing distance each day. No lifting greater than 5 lbs.  Avoid excessive neck motion. No driving for 2 weeks; may ride as a passenger locally.  Diet Resume your normal diet.  Return to Work Will be discussed at your follow up appointment. Call Your Doctor If Any of These Occur Redness, drainage, or swelling at the wound.  Temperature greater than 101 degrees. Severe pain not relieved by pain medication. Incision starts to come apart. Follow Up Appt Call today for appointment in 3 weeks [phone]) or for problems.  If you have any hardware placed in your spine, you will need an x-ray before your appointment.

## 2019-02-10 NOTE — TOC Transition Note (Signed)
Transition of Care [facility]) - CM/SW Discharge Note   Patient Details  Name: Thomas Mcclure MRN: [id number] Date of Birth: [birth year]  [facility]) CM/SW Contact:  Pollie Friar, RN Phone Number: 02/10/2019, 11:49 AM   Clinical Narrative:    Pt discharging home with self care. No f/u per PT and no DME needs.  Pt has spouse at home and transportation to home.    Final next level of care: Home/Self Care Barriers to Discharge: No Barriers Identified   Patient Goals and CMS Choice        Discharge Placement                       Discharge Plan and Services                                     Social Determinants of Health (SDOH) Interventions     Readmission Risk Interventions No flowsheet data found.

## 2019-02-10 NOTE — Evaluation (Addendum)
Physical Therapy Evaluation Patient Details Name: Thomas Mcclure MRN: [id number] DOB: 11/08/1955 Today's Date: 02/10/2019   History of Present Illness  Pt is a 63 y.o. M with significant PMH of MI, RA, multiple back surgeries and spinal cord stimulator placed about 8 years ago. Recent work up x-rays showing leads migrated caudally. S/p removal and replacement of spinal cord stimulator 12/16.  Clinical Impression  Patient evaluated by Physical Therapy with no further acute PT needs identified.  Pt reports improved pain control post surgery listed above. Pt ambulating hallway distances with a walker without physical difficulty. Negotiated 2 steps with bilateral railings to simulate home environment. Education provided re: cryotherapy, car transfer technique. All education has been completed and the patient has no further questions. No follow-up Physical Therapy or equipment needs. PT is signing off. Thank you for this referral.     Follow Up Recommendations No PT follow up    Equipment Recommendations  None recommended by PT    Recommendations for Other Services       Precautions / Restrictions Precautions Precautions: Back(for comfort) Precaution Booklet Issued: No Restrictions Weight Bearing Restrictions: No      Mobility  Bed Mobility Overal bed mobility: Modified Independent             General bed mobility comments: HOB elevated, use of bed rail. Pt states he has an adjustable bed at home  Transfers Overall transfer level: Modified independent Equipment used: Rolling walker (2 wheeled)             General transfer comment: Increased time/effort  Ambulation/Gait Ambulation/Gait assistance: Supervision Gait Distance (Feet): 300 Feet Assistive device: Rolling walker (2 wheeled) Gait Pattern/deviations: Step-through pattern;Decreased stride length;Trunk flexed;Wide base of support     General Gait Details: Pt with slow and steady pace, increased trunk flexion  with progressive distance  Stairs Stairs: Yes Stairs assistance: Supervision Stair Management: Two rails Number of Stairs: 2    Wheelchair Mobility    Modified Rankin (Stroke Patients Only)       Balance Overall balance assessment: Mild deficits observed, not formally tested                                           Pertinent Vitals/Pain Pain Assessment: Faces Faces Pain Scale: Hurts a little bit Pain Location: surgical site Pain Descriptors / Indicators: Operative site guarding Pain Intervention(s): Monitored during session    Home Living Family/patient expects to be discharged to:: Private residence Living Arrangements: Spouse/significant other Available Help at Discharge: Family Type of Home: House Home Access: Stairs to enter Entrance Stairs-Rails: Right;Left;Can reach both Entrance Stairs-Number of Steps: 2 Home Layout: One level Home Equipment: Environmental consultant - 2 wheels;Cane - single point;Bedside commode      Prior Function Level of Independence: Independent with assistive device(s)         Comments: using RW/SPC for mobility. Pt is a radio host for [employer]     Hand Dominance   Dominant Hand: Right    Extremity/Trunk Assessment   Upper Extremity Assessment Upper Extremity Assessment: Overall WFL for tasks assessed    Lower Extremity Assessment Lower Extremity Assessment: Overall WFL for tasks assessed    Cervical / Trunk Assessment Cervical / Trunk Assessment: Kyphotic  Communication   Communication: No difficulties  Cognition Arousal/Alertness: Awake/alert Behavior During Therapy: WFL for tasks assessed/performed Overall Cognitive Status: Within Functional Limits for tasks  assessed                                        General Comments      Exercises     Assessment/Plan    PT Assessment Patent does not need any further PT services  PT Problem List Decreased strength;Decreased activity tolerance;Decreased  balance;Decreased mobility;Pain       PT Treatment Interventions      PT Goals (Current goals can be found in the Care Plan section)  Acute Rehab PT Goals Patient Stated Goal: "go home today." PT Goal Formulation: All assessment and education complete, DC therapy    Frequency     Barriers to discharge        Co-evaluation               AM-PAC PT "6 Clicks" Mobility  Outcome Measure Help needed turning from your back to your side while in a flat bed without using bedrails?: None Help needed moving from lying on your back to sitting on the side of a flat bed without using bedrails?: None Help needed moving to and from a bed to a chair (including a wheelchair)?: None Help needed standing up from a chair using your arms (e.g., wheelchair or bedside chair)?: None Help needed to walk in hospital room?: None Help needed climbing 3-5 steps with a railing? : None 6 Click Score: 24    End of Session   Activity Tolerance: Patient tolerated treatment well Patient left: in bed;with call bell/phone within reach;with bed alarm set Nurse Communication: Mobility status PT Visit Diagnosis: Pain;Difficulty in walking, not elsewhere classified (R26.2) Pain - part of body: (back)    Time: [id number] PT Time Calculation (min) (ACUTE ONLY): 24 min   Charges:   PT Evaluation $PT Eval Low Complexity: 1 Low PT Treatments $Therapeutic Activity: 8-22 mins        Ellamae Sia, PT, DPT Acute Rehabilitation Services Pager [phone] Office [phone]   Willy Eddy 02/10/2019, 8:56 AM

## 2019-02-10 NOTE — Discharge Summary (Signed)
Physician Discharge Summary  Patient ID: Thomas Mcclure MRN: [id number] DOB/AGE: Jul 04, 1955 63 y.o.  Admit date: 02/09/2019 Discharge date: 02/10/2019  Admission Diagnoses: Chronic low back pain  Discharge Diagnoses:  Active Problems:   Chronic low back pain   Discharged Condition: good  Hospital Course: Patient was admitted to 3W following removal of old spinal cord stimulator and insertion of new spinal cord stimulator generator and leads. He has done well since the procedure. He has ambulated in the hall and worked with therapies. His pain is at a tolerable level. He is ready for discharge home.  Consults: rehabilitation medicine  Significant Diagnostic Studies: radiology: DG Thoracolumabar Spine  Result Date: 02/09/2019 CLINICAL DATA:  Spinal stimulator placement EXAM: DG C-ARM 1-60 MIN; THORACOLUMBAR SPINE - 2 VIEW COMPARISON:  None. FLUOROSCOPY TIME:  Fluoroscopy Time:  Not available Radiation Exposure Index (if provided by the fluoroscopic device): Not available Number of Acquired Spot Images: 1 FINDINGS: Spinal stimulator is noted over the lower thoracic spine. Changes of prior thoracolumbar fusion are seen. IMPRESSION: Status post spinal stimulator placement Electronically Signed   By: Inez Catalina M.D.   On: 02/09/2019 13:48   DG C-Arm 1-60 Min  Result Date: 02/09/2019 CLINICAL DATA:  Spinal stimulator placement EXAM: DG C-ARM 1-60 MIN; THORACOLUMBAR SPINE - 2 VIEW COMPARISON:  None. FLUOROSCOPY TIME:  Fluoroscopy Time:  Not available Radiation Exposure Index (if provided by the fluoroscopic device): Not available Number of Acquired Spot Images: 1 FINDINGS: Spinal stimulator is noted over the lower thoracic spine. Changes of prior thoracolumbar fusion are seen. IMPRESSION: Status post spinal stimulator placement Electronically Signed   By: Inez Catalina M.D.   On: 02/09/2019 13:48     Treatments: surgery: Removal of spinal cord stimulator; insertion of [employer]  spinal cord stimulator generator and leads  Discharge Exam: Blood pressure 130/88, pulse (!) 104, temperature 98.5 F (36.9 C), temperature source Oral, resp. rate 20, height 5\' 9"  (1.753 m), weight 120.2 kg, SpO2 95 %.   Alert and oriented x 4 PERRLA CN II-XII grossly intact Speech fluent MAE, Strength and sensation intact Incisions are covered with Honeycomb dressing and Steri Strips; Dressing has a small amount of sanguinous drainage, but is dry and intact   Disposition: Discharge disposition: 01-Home or Self Care        Allergies as of 02/10/2019      Reactions   Brilinta [ticagrelor] Shortness Of Breath, Other (See Comments)   CHEST PAIN    Methylprednisolone Other (See Comments)   DIAPHORESIS, HYPOTENSION   Prednisone Other (See Comments)   DIAPHORESIS, HYPOTENSION   Toradol [ketorolac Tromethamine] Other (See Comments)   DIAPHORESIS, HYPOTENSION   Adhesive [tape] Rash, Other (See Comments)   "blisters" ( NO SURGICAL TAPE, PLEASE)      Medication List    TAKE these medications   acetaminophen 500 MG tablet Commonly known as: TYLENOL Take 1,000 mg by mouth 2 (two) times daily as needed (pain.).   aspirin EC 81 MG tablet Take 1 tablet (81 mg total) by mouth daily.   atorvastatin 80 MG tablet Commonly known as: LIPITOR Take 80 mg by mouth daily at 6 PM.   carvedilol 6.25 MG tablet Commonly known as: COREG Take 1 tablet (6.25 mg total) by mouth 2 (two) times daily.   chlorthalidone 25 MG tablet Commonly known as: HYGROTON Take 1 tablet (25 mg total) by mouth daily.   cyclobenzaprine 10 MG tablet Commonly known as: FLEXERIL Take 1 tablet (10 mg total) by  mouth 3 (three) times daily as needed for muscle spasms. What changed: See the new instructions.   diclofenac sodium 1 % Gel Commonly known as: VOLTAREN Apply 1 application topically 4 (four) times daily as needed (pain.).   Dilt-XR 180 MG 24 hr capsule Generic drug: diltiazem Take 180 mg by mouth  daily.   docusate sodium 100 MG capsule Commonly known as: COLACE Take 1 capsule (100 mg total) by mouth 2 (two) times daily.   DULoxetine 60 MG capsule Commonly known as: CYMBALTA Take 1 capsule (60 mg total) by mouth daily.   glipiZIDE 5 MG tablet Commonly known as: GLUCOTROL Take 5 mg by mouth daily before breakfast.   lisinopril 20 MG tablet Commonly known as: ZESTRIL Take 20 mg by mouth at bedtime.   lubiprostone 24 MCG capsule Commonly known as: AMITIZA Take 48 mcg by mouth at bedtime.   metFORMIN 500 MG 24 hr tablet Commonly known as: Glucophage XR Take 2 tablets (1,000 mg total) by mouth 2 (two) times daily.   multivitamin with minerals Tabs tablet Take 1 tablet by mouth daily.   nitroGLYCERIN 0.4 MG SL tablet Commonly known as: NITROSTAT Place 1 tablet (0.4 mg total) under the tongue every 5 (five) minutes as needed for chest pain.   omeprazole 40 MG capsule Commonly known as: PRILOSEC Take 1 capsule (40 mg total) by mouth daily.   Oxycodone HCl 20 MG Tabs Take 1 tablet (20 mg total) by mouth 5 (five) times daily.   polyethylene glycol powder 17 GM/SCOOP powder Commonly known as: GLYCOLAX/MIRALAX Take 17 g by mouth daily. Mix in 8 oz water, juice, soda, coffee or tea and drink What changed: when to take this   potassium chloride SA 20 MEQ tablet Commonly known as: KLOR-CON Take 1 tablet (20 mEq total) by mouth daily.   pregabalin 200 MG capsule Commonly known as: LYRICA Take 1 capsule (200 mg total) by mouth 2 (two) times daily.   QC LIDOCAINE PAIN RELIEF EX Place 1 patch onto the skin daily as needed (pain.).   senna-docusate 8.6-50 MG tablet Commonly known as: Senokot S Take 1 tablet by mouth 2 (two) times daily. What changed:   how much to take  when to take this  additional instructions      Follow-up Information    Newman Pies, MD. Schedule an appointment as soon as possible for a visit in 3 week(s).   Specialty:  Neurosurgery Contact information: [address] [place] [address] [place] [address] [phone]           Signed: Patricia Nettle 02/10/2019, 11:31 AM

## 2019-02-15 ENCOUNTER — Ambulatory Visit: Payer: Medicare Other | Admitting: Cardiology

## 2019-02-27 NOTE — Progress Notes (Signed)
Virtual Visit via Telephone Note   This visit type was conducted due to national recommendations for restrictions regarding the COVID-19 Pandemic (e.g. social distancing) in an effort to limit this patient's exposure and mitigate transmission in our community.  Due to his co-morbid illnesses, this patient is at least at moderate risk for complications without adequate follow up.  This format is felt to be most appropriate for this patient at this time.  The patient did not have access to video technology/had technical difficulties with video requiring transitioning to audio format only (telephone).  All issues noted in this document were discussed and addressed.  No physical exam could be performed with this format.  Please refer to the patient's chart for his  consent to telehealth for [facility].   Date:  02/28/2019   ID:  Thomas Mcclure, DOB [birth year], MRN [id number]  Patient Location: Home Provider Location: Office  PCP:  Vassie Moment, MD  Cardiologist:  Sinclair Grooms, MD  Electrophysiologist:  None   Evaluation Performed:  Follow-Up Visit  Chief Complaint:  Tachycardia.   History of Present Illness:    Thomas Mcclure is a 64 y.o. male with a hx of NSTEMI in 2017status post DES to the RCA. MI wascomplicated byIBLV aneurysm and chroniccombinedsystolic/diastolicheart 123456 percent 2020), diabetes mellitusII, hypertension, hyperlipidemia, and chronic back pain status post spinal stimulator. Cath in 2018 demonstrated patent stent.  He has undergoing his fourth orthopedic/spine procedure in the last 3 years.  This most recent procedure was a T2-L2 spine procedure by Dr. Newman Pies.. They feel that Dr. Rockne Menghini has done an excellent job.  They have nothing by complaints about the care received at [facility].  They feel he was stranded and not adequately cared for.   Last visit He was doing relatively well relative to recuperation.  Still having  significant back pain. They complaining Toprol causes him sluggish.  Last visit with change of bp  meds from lopressor to coreg 6.25 BID with goal for HR < 100  02/09/19 he had removal of old spinal cord stimulator and new one placed.   This was found after his device shocked him in 2 different incidences.      Today he is doing well. No angina, no SOB.  He is recovering from second surgery.  His HR is in the 80s and BP is elevated, has been elevated recently, may be related to back pain Has been watching his diet.  He and his wife are taking their pets to the vet.  They sound as if they are enjoying each other.   The patient does not have symptoms concerning for COVID-19 infection (fever, chills, cough, or new shortness of breath).    Past Medical History:  Diagnosis Date  . Baker's cyst    Left calf  . CAD in native artery, 07/15/15 PCI of RCA with DES 07/16/2015   a.   NSTEMI 5/17: LHC - pLAD 20, pLCx 20, OM1 30, pRCA 100, EF 45-50%>> PCI: 2.5 x 24 mm Promus DES to RCA  //  b.   Echo 5/17: mild LVH, EF 50-55%, no RWMA, mod RVE //  c. LHC 6/17: pLAD 20, pLCx 20, OM1 50, pRCA stent ok, EF 35-45% with mod sized inf wall and basal segment aneurysm  . Chronic back pain    "down my back, down my legs" (02/18/2017)  . Constipation   . Constipation due to opioid therapy   . GERD (gastroesophageal reflux  disease)   . Hyperlipidemia   . Hypertension    Dr. [name] [phone]  . Ischemic cardiomyopathy    a. LV-gram at time of LHC in 6/17 with EF 35-45%  //  b. Echo 7/17: EF 45-50%, inferior HK, grade 1 diastolic dysfunction, mildly dilated aortic root, moderately reduced RVSF, mild RAE  . Myocardial infarction ([place]) 2017  . 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 Numbness and tingling of both lower extremities    "on the outside of both sides" (02/18/2017)  . Rheumatoid arthritis ([place])   . Tachycardia   . Type II diabetes mellitus ([place])    diet controlled   Past  Surgical History:  Procedure Laterality Date  . BACK SURGERY     x3  . CARDIAC CATHETERIZATION N/A 07/15/2015   Procedure: Left Heart Cath and Coronary Angiography;  Surgeon: [name], MD;  Location: [place] [facility];  Service: Cardiovascular;  Laterality: N/A;  . CARDIAC CATHETERIZATION N/A 07/15/2015   Procedure: Coronary Stent Intervention;  Surgeon: [name], MD;  Location: [place] [facility];  Service: Cardiovascular;  Laterality: N/A;  . CARDIAC CATHETERIZATION N/A 08/07/2015   Procedure: Left Heart Cath and Coronary Angiography;  Surgeon: [name], MD;  Location: [place] [facility];  Service: Cardiovascular;  Laterality: N/A;  . CORONARY ANGIOPLASTY WITH STENT PLACEMENT  07/2015  . LEFT HEART CATH AND CORONARY ANGIOGRAPHY N/A 02/19/2017   Procedure: LEFT HEART CATH AND CORONARY ANGIOGRAPHY;  Surgeon: Martinique, Peter M, MD;  Location: [place] [facility];  Service: Cardiovascular;  Laterality: N/A;  . LUMBAR SPINE SURGERY  03/2009  & 2012  . MASS EXCISION N/A 04/19/2012   Procedure: removal of posterior cervical lipoma;  Surgeon: [name], MD;  Location: [place] [facility];  Service: Neurosurgery;  Laterality: N/A;  Removal of posterior cervical lipoma  . POPLITEAL SYNOVIAL CYST EXCISION Left    "opened up behind my knee; scraped out arthritis"  . POSTERIOR LUMBAR FUSION 4 LEVEL N/A 11/22/2018   Procedure: Posterior Lateral and Interbody fusion - Lumbar one-Lumbar two; explore fusion, posterior instrumentation and fusion Thoracic ten to the ilium;  Surgeon: Newman Pies, MD;  Location: [place];  Service: Neurosurgery;  Laterality: N/A;  . SPINAL CORD STIMULATOR INSERTION N/A 01/07/2013   Procedure:  SPINAL CORD STIMULATOR INSERTION;  Surgeon: [name], MD;  Location: [place] [facility];  Service: Neurosurgery;  Laterality: N/A;  . SPINAL CORD STIMULATOR INSERTION N/A 02/09/2019   Procedure: REPLACEMENT OF LUMBAR SPINAL CORD STIMULATOR;  Surgeon: Newman Pies, MD;   Location: [place];  Service: Neurosurgery;  Laterality: N/A;  Thoracic/Lumbar spine  . TONSILLECTOMY     patient denies  . TOTAL HIP ARTHROPLASTY Right 10/03/2016   Procedure: TOTAL HIP ARTHROPLASTY;  Surgeon: [name], MD;  Location: [place];  Service: Orthopedics;  Laterality: Right;  . WISDOM TOOTH EXTRACTION     hx of     Current Meds  Medication Sig  . acetaminophen (TYLENOL) 500 MG tablet Take 1,000 mg by mouth 2 (two) times daily as needed (pain.).  Marland Kitchen aspirin EC 81 MG tablet Take 1 tablet (81 mg total) by mouth daily.  Marland Kitchen atorvastatin (LIPITOR) 80 MG tablet Take 80 mg by mouth daily at 6 PM.  . chlorthalidone (HYGROTON) 25 MG tablet Take 1 tablet (25 mg total) by mouth daily.  . cyclobenzaprine (FLEXERIL) 10 MG tablet Take 1 tablet (10 mg total) by mouth 3 (three) times daily  as needed for muscle spasms.  . diclofenac sodium (VOLTAREN) 1 % GEL Apply 1 application topically 4 (four) times daily as needed (pain.).   Marland Kitchen DILT-XR 180 MG 24 hr capsule Take 180 mg by mouth daily.   Marland Kitchen docusate sodium (COLACE) 100 MG capsule Take 1 capsule (100 mg total) by mouth 2 (two) times daily.  . DULoxetine (CYMBALTA) 60 MG capsule Take 1 capsule (60 mg total) by mouth daily.  Marland Kitchen glipiZIDE (GLUCOTROL) 5 MG tablet Take 5 mg by mouth daily before breakfast.   . lisinopril (PRINIVIL,ZESTRIL) 20 MG tablet Take 20 mg by mouth at bedtime.   Marland Kitchen lubiprostone (AMITIZA) 24 MCG capsule Take 48 mcg by mouth at bedtime.   . metFORMIN (GLUCOPHAGE XR) 500 MG 24 hr tablet Take 2 tablets (1,000 mg total) by mouth 2 (two) times daily.  . Multiple Vitamin (MULTIVITAMIN WITH MINERALS) TABS tablet Take 1 tablet by mouth daily.  . nitroGLYCERIN (NITROSTAT) 0.4 MG SL tablet Place 1 tablet (0.4 mg total) under the tongue every 5 (five) minutes as needed for chest pain.  Marland Kitchen omeprazole (PRILOSEC) 40 MG capsule Take 1 capsule (40 mg total) by mouth daily.  . Oxycodone HCl 20 MG TABS Take 1 tablet (20 mg total) by mouth 5 (five)  times daily.  . polyethylene glycol powder (GLYCOLAX/MIRALAX) powder Take 17 g by mouth daily. Mix in 8 oz water, juice, soda, coffee or tea and drink (Patient taking differently: Take 17 g by mouth every other day. Mix in 8 oz water, juice, soda, coffee or tea and drink)  . potassium chloride SA (K-DUR,KLOR-CON) 20 MEQ tablet Take 1 tablet (20 mEq total) by mouth daily.  . pregabalin (LYRICA) 200 MG capsule Take 1 capsule (200 mg total) by mouth 2 (two) times daily.  . QC LIDOCAINE PAIN RELIEF EX Place 1 patch onto the skin daily as needed (pain.).  Marland Kitchen senna-docusate (SENOKOT S) 8.6-50 MG tablet Take 1 tablet by mouth 2 (two) times daily. (Patient taking differently: Take 1-2 tablets by mouth See admin instructions. 1 tab midday; 2 tab at bedtime)  . [DISCONTINUED] carvedilol (COREG) 6.25 MG tablet Take 1 tablet (6.25 mg total) by mouth 2 (two) times daily.     Allergies:   Brilinta [ticagrelor], Methylprednisolone, Prednisone, Toradol [ketorolac tromethamine], and Adhesive [tape]   Social History   Tobacco Use  . Smoking status: Former Smoker    Packs/day: 0.25    Years: 40.00    Pack years: 10.00    Types: Cigarettes    Quit date: 2008    Years since quitting: 13.0  . Smokeless tobacco: Never Used  . Tobacco comment: 02/18/2017 "quit in 2012"  Substance Use Topics  . Alcohol use: No    Alcohol/week: 0.0 standard drinks  . Drug use: No     Family Hx: The patient's family history includes Heart disease in his mother; Prostate cancer in his brother.  ROS:   Please see the history of present illness.    General:no colds or fevers, no weight changes Skin:no rashes or ulcers HEENT:no blurred vision, no congestion CV:see HPI PUL:see HPI GI:no diarrhea constipation or melena, no indigestion GU:no hematuria, no dysuria MS:no joint pain, no claudication Neuro:no syncope, no lightheadedness Endo:+ diabetes, no thyroid disease  All other systems reviewed and are  negative.   Prior CV studies:   The following studies were reviewed today:  Echo 10/15/18  IMPRESSIONS    1. The left ventricle has mildly reduced systolic function, with an ejection  fraction of 45-50%. The cavity size was normal. There is mildly increased left ventricular wall thickness. Left ventricular diastolic Doppler parameters are consistent with  impaired relaxation.  2. Abnormal septal motion inferior basal hypokinesis.  3. The right ventricle has normal systolic function. The cavity was normal. There is no increase in right ventricular wall thickness.  4. Mild thickening of the mitral valve leaflet. Mild calcification of the mitral valve leaflet. There is mild mitral annular calcification present.  5. The aortic valve is tricuspid. Mild thickening of the aortic valve. Sclerosis without any evidence of stenosis of the aortic valve.  6. The aorta is normal unless otherwise noted.  7. The interatrial septum was not well visualized.  FINDINGS  Left Ventricle: The left ventricle has mildly reduced systolic function, with an ejection fraction of 45-50%. The cavity size was normal. There is mildly increased left ventricular wall thickness. Left ventricular diastolic Doppler parameters are  consistent with impaired relaxation. Abnormal septal motion inferior basal hypokinesis.  Right Ventricle: The right ventricle has normal systolic function. The cavity was normal. There is no increase in right ventricular wall thickness.  Left Atrium: Left atrial size was normal in size.  Right Atrium: Right atrial size was normal in size. Right atrial pressure is estimated at 10 mmHg.  Interatrial Septum: The interatrial septum was not well visualized.  Pericardium: There is no evidence of pericardial effusion.  Mitral Valve: The mitral valve is normal in structure. Mild thickening of the mitral valve leaflet. Mild calcification of the mitral valve leaflet. There is mild mitral annular  calcification present. Mitral valve regurgitation is trivial by color flow  Doppler.  Tricuspid Valve: The tricuspid valve is normal in structure. Tricuspid valve regurgitation is mild by color flow Doppler.  Aortic Valve: The aortic valve is tricuspid Mild thickening of the aortic valve. Sclerosis without any evidence of stenosis of the aortic valve. Aortic valve regurgitation was not visualized by color flow Doppler. There is no evidence of aortic valve  stenosis.  Pulmonic Valve: The pulmonic valve was grossly normal. Pulmonic valve regurgitation is mild by color flow Doppler.  Aorta: The aorta is normal unless otherwise noted.   Labs/Other Tests and Data Reviewed:    EKG:  An ECG dated 10/06/18 was personally reviewed today and demonstrated:  ST at 8, old inf MI with Q waves in inf. leads, old, no new changes.  Recent Labs: 04/26/2018: ALT 39; TSH 0.981 02/09/2019: BUN 14; Creatinine, Ser 1.09; Hemoglobin 11.4; Platelets 364; Potassium 4.3; Sodium 137   Recent Lipid Panel Lab Results  Component Value Date/Time   CHOL 111 04/26/2018 10:35 AM   TRIG 114 04/26/2018 10:35 AM   HDL 31 (L) 04/26/2018 10:35 AM   CHOLHDL 3.6 04/26/2018 10:35 AM   CHOLHDL 4.3 02/19/2017 02:22 AM   LDLCALC 57 04/26/2018 10:35 AM    Wt Readings from Last 3 Encounters:  02/09/19 265 lb (120.2 kg)  01/10/19 275 lb 12.8 oz (125.1 kg)  11/23/18 268 lb 15.4 oz (122 kg)     Objective:    Vital Signs:  BP (!) 154/96   Pulse 84    VITAL SIGNS:  reviewed  General, alert male in NAD Neuro A&O X 3 answers questions approp. Psych: pleasant.  ASSESSMENT & PLAN:    1. Essential HTN, BP still elevated, though HR improved.  Will increase coreg to 12.5 BID for goal BP 130/82 pt is agreeable and will repeat visit in 2 months, virtual is fine.  2. CAD  with no angina on statin, asa, BB 3. 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   COVID-19 Education: The signs and symptoms of  COVID-19 were discussed with the patient and how to seek care for testing (follow up with PCP or arrange E-visit).  The importance of social distancing was discussed today.  Time:   Today, I have spent 12 minutes with the patient with telehealth technology discussing the above problems.     Medication Adjustments/Labs and Tests Ordered: Current medicines are reviewed at length with the patient today.  Concerns regarding medicines are outlined above.   Tests Ordered: No orders of the defined types were placed in this encounter.   Medication Changes: Meds ordered this encounter  Medications  . carvedilol (COREG) 12.5 MG tablet    Sig: Take 1 tablet (12.5 mg total) by mouth 2 (two) times daily.    Dispense:  180 tablet    Refill:  3    Follow Up:  Virtual Visit  in 2 month(s)  Signed, Cecilie Kicks, NP  02/28/2019 1:49 PM    [facility]

## 2019-02-28 ENCOUNTER — Encounter: Payer: Self-pay | Admitting: Cardiology

## 2019-02-28 ENCOUNTER — Telehealth (INDEPENDENT_AMBULATORY_CARE_PROVIDER_SITE_OTHER): Payer: Medicare Other | Admitting: Cardiology

## 2019-02-28 ENCOUNTER — Other Ambulatory Visit: Payer: Self-pay

## 2019-02-28 ENCOUNTER — Telehealth: Payer: Self-pay

## 2019-02-28 VITALS — BP 154/96 | HR 84

## 2019-02-28 DIAGNOSIS — I2510 Atherosclerotic heart disease of native coronary artery without angina pectoris: Secondary | ICD-10-CM

## 2019-02-28 DIAGNOSIS — I10 Essential (primary) hypertension: Secondary | ICD-10-CM

## 2019-02-28 DIAGNOSIS — I5042 Chronic combined systolic (congestive) and diastolic (congestive) heart failure: Secondary | ICD-10-CM

## 2019-02-28 MED ORDER — CARVEDILOL 12.5 MG PO TABS: 12.5000 mg | ORAL_TABLET | Freq: Two times a day (BID) | ORAL | 3 refills | Status: DC

## 2019-02-28 NOTE — Telephone Encounter (Signed)

## 2019-02-28 NOTE — Patient Instructions (Signed)
Medication Instructions:  Your physician has recommended you make the following change in your medication:  1.  INCREASE your Carvedilol to 12.5 mg taking 1 tablet twice a day   *If you need a refill on your cardiac medications before your next appointment, please call your pharmacy*  Lab Work: None ordered  If you have labs (blood work) drawn today and your tests are completely normal, you will receive your results only by: Marland Kitchen MyChart Message (if you have MyChart) OR . A paper copy in the mail If you have any lab test that is abnormal or we need to change your treatment, we will call you to review the results.  Testing/Procedures: None ordered  Follow-Up: At [facility], you and your health needs are our priority.  As part of our continuing mission to provide you with exceptional heart care, we have created designated Provider Care Teams.  These Care Teams include your primary Cardiologist (physician) and Advanced Practice Providers (APPs -  Physician Assistants and Nurse Practitioners) who all work together to provide you with the care you need, when you need it.  Your next appointment:   2 month(s)   MARCH 8 8:15  The format for your next appointment:   Virtual Visit   Provider:   Cecilie Kicks, NP  Other Instructions

## 2019-04-01 ENCOUNTER — Other Ambulatory Visit: Payer: Self-pay | Admitting: Neurosurgery

## 2019-04-01 ENCOUNTER — Telehealth: Payer: Self-pay | Admitting: *Deleted

## 2019-04-01 NOTE — Telephone Encounter (Signed)
   [facility] Pre-operative Risk Assessment    Request for surgical clearance:   1. What type of surgery is being performed? REVISION OF INSTRUMENTATION SACROLUMBAR FUSION  2. When is this surgery scheduled? 04/11/19   3. What type of clearance is required (medical clearance vs. Pharmacy clearance to hold med vs. Both)? MEDICAL  4. Are there any medications that need to be held prior to surgery and how long? ASA   5. Practice name and name of physician performing surgery? [facility]; DR. JEFFREY JENKINS   6. What is your office phone number [phone]    7.   What is your office fax number [phone]  8.   Anesthesia type (None, local, MAC, general) ? GENERAL   Thomas Mcclure 04/01/2019, 4:02 PM  _________________________________________________________________   (provider comments below)

## 2019-04-01 NOTE — Telephone Encounter (Signed)
Sinclair Grooms, MD Cecilie Kicks, NP 02/28/2019 From her note:  1. Essential HTN, BP still elevated, though HR improved.  Will increase coreg to 12.5 BID for goal BP 130/82 pt is agreeable and will repeat visit in 2 months, virtual is fine.  2. CAD with no angina on statin, asa, BB 3. 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   Will route to Dr [name] to address holding ASA.  Will route to Cecilie Kicks, NP to address cardiac clearance.   Rosaria Ferries, PA-C 04/01/2019 4:38 PM

## 2019-04-02 NOTE — Telephone Encounter (Signed)
Okay to hold ASA.  °

## 2019-04-03 NOTE — Telephone Encounter (Signed)
Pt last physically seen in 12/2018 on telehealth his BP was elevated so would need phone call and BP check to clear.  I will send back to pre-op but if stable he is moderate risk with type of surgery.  He did have surgery in December and did well.

## 2019-04-04 NOTE — Telephone Encounter (Signed)
   Primary Cardiologist: Sinclair Grooms, MD  Chart reviewed as part of pre-operative protocol coverage. Patient was last seen by Cecilie Kicks, NP, for a virtual visit on 02/28/2019 at which time patient was doing well from a cardiac standpoint. I called and spoke with patient today, and he reports no changes since last visit. No chest pain, shortness of breath, palpitations, lightheadedness, dizziness, syncope, or acute CHF symptoms. Activity is limited by back pain but he denies any exertional symptoms. He has had 2 other back surgeries since 10/2018 and tolerated them both well form a cardiac standpoint.  Given past medical history and time since last visit, based on ACC/AHA guidelines, Thomas Mcclure would be at acceptable risk for the planned procedure without further cardiovascular testing.   Per [name], [place] to hold Aspirin prior to procedure if needed. Should be restarted as soon as able following procedure.  I will route this recommendation to the requesting party via Epic fax function and remove from pre-op pool.  Please call with questions.  Thomas Mclean, PA-C 04/04/2019, 9:27 AM

## 2019-04-06 NOTE — Pre-Procedure Instructions (Signed)
MARTE ALLDAY  04/06/2019      [facility] [address] [place], [place] [address] [place] [address] Phone: [phone] Fax: [phone]    Your procedure is scheduled on Feb.15  Report to [facility] entrance A at 10:55 A.M.  Call this number if you have problems the morning of surgery:  [phone]   Remember:  Do not eat or drink after midnight.      Take these medicines the morning of surgery with A SIP OF WATER :             Tylenol if needed            Carvedilol (coreg)            flexeril if needed            dilt-xr            Docusate sodium (colace)            Duloxetine (cymbalta)            Nitroglycerine if needed            Omeprazole (prilosec)            Oxycodone            pregabalin (lyrica)         7 days prior to surgery STOP taking any Aspirin (unless otherwise instructed by your surgeon), Aleve, Naproxen, Ibuprofen, Motrin, Advil, Goody's, BC's, all herbal medications, fish oil, and all vitamins.     Follow your surgeon's instructions on when to stop Aspirin.  If no instructions were given by your surgeon then you will need to call the office to get those instructions.        How to Manage Your Diabetes Before and After Surgery  Why is it important to control my blood sugar before and after surgery? . Improving blood sugar levels before and after surgery helps healing and can limit problems. . A way of improving blood sugar control is eating a healthy diet by: o  Eating less sugar and carbohydrates o  Increasing activity/exercise o  Talking with your doctor about reaching your blood sugar goals . High blood sugars (greater than 180 mg/dL) can raise your risk of infections and slow your recovery, so you will need to focus on controlling your diabetes during the weeks before surgery. . Make sure that the doctor who takes care of your diabetes knows about your  planned surgery including the date and location.  How do I manage my blood sugar before surgery? . Check your blood sugar at least 4 times a day, starting 2 days before surgery, to make sure that the level is not too high or low. o Check your blood sugar the morning of your surgery when you wake up and every 2 hours until you get to the Short Stay unit. . If your blood sugar is less than 70 mg/dL, you will need to treat for low blood sugar: o Do not take insulin. o Treat a low blood sugar (less than 70 mg/dL) with  cup of clear juice (cranberry or apple), 4 glucose tablets, OR glucose gel. Recheck blood sugar in 15 minutes after treatment (to make sure it is greater than 70 mg/dL). If your blood sugar is not greater than 70 mg/dL on recheck, call [phone] o  for further instructions. . Report your  blood sugar to the short stay nurse when you get to Short Stay.  . If you are admitted to the hospital after surgery: o Your blood sugar will be checked by the staff and you will probably be given insulin after surgery (instead of oral diabetes medicines) to make sure you have good blood sugar levels. o The goal for blood sugar control after surgery is 80-180 mg/dL.         WHAT DO I DO ABOUT MY DIABETES MEDICATION?   Marland Kitchen Do not take oral diabetes medicines (pills) the morning of surgery.        Do not wear jewelry.  Do not wear lotions, powders, or perfumes, or deodorant.  Do not shave 48 hours prior to surgery.  Men may shave face and neck.  Do not bring valuables to the hospital.  [facility] is not responsible for any belongings or valuables.  Contacts, dentures or bridgework may not be worn into surgery.  Leave your suitcase in the car.  After surgery it may be brought to your room.  For patients admitted to the hospital, discharge time will be determined by your treatment team.  Patients discharged the day of surgery will not be allowed to drive home.    Special  instructions:  [facility]- Preparing For Surgery  Before surgery, you can play an important role. Because skin is not sterile, your skin needs to be as free of germs as possible. You can reduce the number of germs on your skin by washing with CHG (chlorahexidine gluconate) Soap before surgery.  CHG is an antiseptic cleaner which kills germs and bonds with the skin to continue killing germs even after washing.    Oral Hygiene is also important to reduce your risk of infection.  Remember - BRUSH YOUR TEETH THE MORNING OF SURGERY WITH YOUR REGULAR TOOTHPASTE  Please do not use if you have an allergy to CHG or antibacterial soaps. If your skin becomes reddened/irritated stop using the CHG.  Do not shave (including legs and underarms) for at least 48 hours prior to first CHG shower. It is OK to shave your face.  Please follow these instructions carefully.   1. Shower the NIGHT BEFORE SURGERY and the MORNING OF SURGERY with CHG.   2. If you chose to wash your hair, wash your hair first as usual with your normal shampoo.  3. After you shampoo, rinse your hair and body thoroughly to remove the shampoo.  4. Use CHG as you would any other liquid soap. You can apply CHG directly to the skin and wash gently with a scrungie or a clean washcloth.   5. Apply the CHG Soap to your body ONLY FROM THE NECK DOWN.  Do not use on open wounds or open sores. Avoid contact with your eyes, ears, mouth and genitals (private parts). Wash Face and genitals (private parts)  with your normal soap.  6. Wash thoroughly, paying special attention to the area where your surgery will be performed.  7. Thoroughly rinse your body with warm water from the neck down.  8. DO NOT shower/wash with your normal soap after using and rinsing off the CHG Soap.  9. Pat yourself dry with a CLEAN TOWEL.  10. Wear CLEAN PAJAMAS to bed the night before surgery, wear comfortable clothes the morning of surgery  11. Place CLEAN SHEETS on  your bed the night of your first shower and DO NOT SLEEP WITH PETS.    Day of Surgery:  Do not apply any deodorants/lotions.  Please wear clean clothes to the hospital/surgery center.   Remember to brush your teeth WITH YOUR REGULAR TOOTHPASTE.    Please read over the following fact sheets that you were given. Coughing and Deep Breathing and Surgical Site Infection Prevention

## 2019-04-07 ENCOUNTER — Encounter (HOSPITAL_COMMUNITY)
Admission: RE | Admit: 2019-04-07 | Discharge: 2019-04-07 | Disposition: A | Discharge status: Home / Self Care | Payer: Medicare Other | Source: Ambulatory Visit | Attending: Neurosurgery | Admitting: Neurosurgery

## 2019-04-07 ENCOUNTER — Other Ambulatory Visit (HOSPITAL_COMMUNITY)
Admission: RE | Admit: 2019-04-07 | Discharge: 2019-04-07 | Disposition: A | Discharge status: Home / Self Care | Payer: Medicare Other | Source: Ambulatory Visit | Attending: Neurosurgery | Admitting: Neurosurgery

## 2019-04-07 ENCOUNTER — Encounter (HOSPITAL_COMMUNITY): Payer: Self-pay

## 2019-04-07 ENCOUNTER — Other Ambulatory Visit: Payer: Self-pay

## 2019-04-07 DIAGNOSIS — S32009A Unspecified fracture of unspecified lumbar vertebra, initial encounter for closed fracture: Secondary | ICD-10-CM | POA: Diagnosis not present

## 2019-04-07 DIAGNOSIS — Z01812 Encounter for preprocedural laboratory examination: Secondary | ICD-10-CM | POA: Diagnosis not present

## 2019-04-07 DIAGNOSIS — Z20822 Contact with and (suspected) exposure to covid-19: Secondary | ICD-10-CM | POA: Insufficient documentation

## 2019-04-07 HISTORY — DX: Heart failure, unspecified: I50.9

## 2019-04-07 LAB — CBC
HCT: 37.1 % — ABNORMAL LOW (ref 39.0–52.0)
Hemoglobin: 11.2 g/dL — ABNORMAL LOW (ref 13.0–17.0)
MCH: 23.9 pg — ABNORMAL LOW (ref 26.0–34.0)
MCHC: 30.2 g/dL (ref 30.0–36.0)
MCV: 79.3 fL — ABNORMAL LOW (ref 80.0–100.0)
Platelets: 373 10*3/uL (ref 150–400)
RBC: 4.68 MIL/uL (ref 4.22–5.81)
RDW: 19.7 % — ABNORMAL HIGH (ref 11.5–15.5)
WBC: 7.8 10*3/uL (ref 4.0–10.5)
nRBC: 0 % (ref 0.0–0.2)

## 2019-04-07 LAB — SURGICAL PCR SCREEN
MRSA, PCR: NEGATIVE
Staphylococcus aureus: NEGATIVE

## 2019-04-07 LAB — HEMOGLOBIN A1C
Hgb A1c MFr Bld: 7.5 % — ABNORMAL HIGH (ref 4.8–5.6)
Mean Plasma Glucose: 168.55 mg/dL

## 2019-04-07 LAB — BASIC METABOLIC PANEL
Anion gap: 13 (ref 5–15)
BUN: 14 mg/dL (ref 8–23)
CO2: 25 mmol/L (ref 22–32)
Calcium: 9.1 mg/dL (ref 8.9–10.3)
Chloride: 98 mmol/L (ref 98–111)
Creatinine, Ser: 1.2 mg/dL (ref 0.61–1.24)
GFR calc Af Amer: >60 mL/min (ref >60)
GFR calc non Af Amer: >60 mL/min (ref >60)
Glucose, Bld: 122 mg/dL — ABNORMAL HIGH (ref 70–99)
Potassium: 4.1 mmol/L (ref 3.5–5.1)
Sodium: 136 mmol/L (ref 135–145)

## 2019-04-07 LAB — TYPE AND SCREEN
ABO/RH(D): B POS
Antibody Screen: NEGATIVE

## 2019-04-07 LAB — SARS CORONAVIRUS 2 (TAT 6-24 HRS): SARS Coronavirus 2: NEGATIVE

## 2019-04-07 LAB — GLUCOSE, CAPILLARY: Glucose-Capillary: 118 mg/dL — ABNORMAL HIGH (ref 70–99)

## 2019-04-07 NOTE — Pre-Procedure Instructions (Signed)
Thomas Mcclure  04/07/2019       Your procedure is scheduled on Feb.15  Report to [facility] entrance A at 10:55 A.M.              Your surgery or procedure is scheduled for 12:50 AM  Call this number if you have problems the morning of surgery:  [phone]- this is the pre- surgery desk   Remember:  Do not eat or drink after midnightthe evening before surgery      Take these medicines the morning of surgery with A SIP OF WATER : Carvedilol (coreg) Duloxetine (cymbalta) dDilt-xr  Docusate sodium (colace)  Omeprazole (prilosec) Oxycodone pregabalin (lyrica)  Tylenol if needed  flexril if needed  Nitroglycerine if needed                7 days prior to surgery STOP taking any Aspirin (unless otherwise instructed by your surgeon), Aleve, Naproxen, Ibuprofen, Motrin, Advil, Voltren gel, Goody's, BC's, all herbal medications, fish oil, and all vitamins.     Follow your surgeon's instructions on when to stop Aspirin.  If no instructions were given by your surgeon then you will need to call the office to get those instructions.    How to Manage Your Diabetes Before and After Surgery  Why is it important to control my blood sugar before and after surgery? . Improving blood sugar levels before and after surgery helps healing and can limit problems. . A way of improving blood sugar control is eating a healthy diet by: o  Eating less sugar and carbohydrates o  Increasing activity/exercise o  Talking with your doctor about reaching your blood sugar goals . High blood sugars (greater than 180 mg/dL) can raise your risk of infections and slow your recovery, so you will need to focus on controlling your diabetes during the weeks before surgery. . Make sure that the doctor who takes care of your diabetes knows about your planned surgery including the date and location.  How do I manage my blood sugar before surgery? . Check your blood sugar at least 4 times a day, starting 2 days  before surgery, to make sure that the level is not too high or low. o Check your blood sugar the morning of your surgery when you wake up and every 2 hours until you get to the Short Stay unit. . If your blood sugar is less than 70 mg/dL, you will need to treat for low blood sugar: o Do not take insulin. o Treat a low blood sugar (less than 70 mg/dL) with  cup of clear juice (cranberry or apple), 4 glucose tablets, OR glucose gel. Recheck blood sugar in 15 minutes after treatment (to make sure it is greater than 70 mg/dL). If your blood sugar is not greater than 70 mg/dL on recheck, call [phone] o  for further instructions. . Report your blood sugar to the short stay nurse when you get to Short Stay.  . If you are admitted to the hospital after surgery: o Your blood sugar will be checked by the staff and you will probably be given insulin after surgery (instead of oral diabetes medicines) to make sure you have good blood sugar levels. o The goal for blood sugar control after surgery is 80-180 mg/dL.          WHAT DO I DO ABOUT MY DIABETES MEDICATION?  Marland Kitchen Do not take oral diabetes medicines (pills) the morning of surgery.  Do not wear jewelry.  Do not wear lotions, powders, or perfumes, or deodorant.  Do not shave 48 hours prior to surgery.  Men may shave face and neck.  Do not bring valuables to the hospital.  [facility] is not responsible for any belongings or valuables.  Contacts, dentures or bridgework may not be worn into surgery.  Leave your suitcase in the car.  After surgery it may be brought to your room.  For patients admitted to the hospital, discharge time will be determined by your treatment team.  Patients discharged the day of surgery will not be allowed to drive home.    Special instructions:  - Preparing For Surgery  Before surgery, you can play an important role. Because skin is not sterile, your skin needs to be as free of germs as  possible. You can reduce the number of germs on your skin by washing with CHG (chlorahexidine gluconate) Soap before surgery.  CHG is an antiseptic cleaner which kills germs and bonds with the skin to continue killing germs even after washing.    Oral Hygiene is also important to reduce your risk of infection.  Remember - BRUSH YOUR TEETH THE MORNING OF SURGERY WITH YOUR REGULAR TOOTHPASTE  Please do not use if you have an allergy to CHG or antibacterial soaps. If your skin becomes reddened/irritated stop using the CHG.  Do not shave (including legs and underarms) for at least 48 hours prior to first CHG shower. It is OK to shave your face.  Please follow these instructions carefully.   1. Shower the NIGHT BEFORE SURGERY and the MORNING OF SURGERY with CHG.   2. If you chose to wash your hair, wash your hair first as usual with your normal shampoo.  3. After you shampoo, wash your face and private area with the soap you use at home, then rinse your hair and body thoroughly to remove the shampoo and soap.  4. Use CHG as you would any other liquid soap. You can apply CHG directly to the skin and wash gently with a scrungie or a clean washcloth.   5. Apply the CHG Soap to your body ONLY FROM THE NECK DOWN.  Do not use on open wounds or open sores. Avoid contact with your eyes, ears, mouth and genitals (private parts).   6. Wash thoroughly, paying special attention to the area where your surgery will be performed.  7. Thoroughly rinse your body with warm water from the neck down.  8. DO NOT shower/wash with your normal soap after using and rinsing off the CHG Soap.  9. Pat yourself dry with a CLEAN TOWEL.  10. Wear CLEAN PAJAMAS to bed the night before surgery, wear comfortable clothes the morning of surgery  Place CLEAN SHEETS on your bed the night of your first shower and DO NOT SLEEP WITH PETS.  Day of Surgery: Shower as instructed above. D0 not apply any deodorants/lotions, powders  or colognes..  Please wear clean clothes to the hospital/surgery center.   Remember to brush your teeth WITH YOUR REGULAR TOOTHPASTE.  Do not wear jewelry.             Do not shave 48 hours prior to surgery.  Men may shave face and neck.  Do not bring valuables to the hospital.  [facility] is not responsible for any belongings or valuables.  Contacts, dentures or bridgework may not be worn into surgery.  Leave your suitcase in the car.  After surgery  it may be brought to your room.  For patients admitted to the hospital, discharge time will be determined by your treatment team. Please read over the following fact sheets that you were given. Coughing and Deep Breathing and Surgical Site Infection Prevention, Pain Management

## 2019-04-07 NOTE — Progress Notes (Addendum)
PCP - Dr. Vassie Moment  Cardiologist - Dr [name]  Chest x-ray -   EKG - 10/06/2018  Stress Test - 10/12/2018  ECHO - 10/15/2018  Cardiac Cath - 02/19/2017  Sleep Study - no CPAP - no  LABS- CBC, BMP, T/S, A1C, PCR  ASA- ok to hold and start as soon as possible post op - cardiology [id number]/82021  ERAS-no  HA1C- Fasting Blood Sugar - 110- 170 Checks Blood Sugar _1____ times a day  Anesthesia-  Pt denies having chest pain, sob, or fever at this time. All instructions explained to the pt, with a verbal understanding of the material. Pt agrees to go over the instructions while at home for a better understanding. Pt also instructed to self quarantine after being tested for COVID-19. The opportunity to ask questions was provided.

## 2019-04-08 ENCOUNTER — Other Ambulatory Visit: Payer: Self-pay | Admitting: Neurosurgery

## 2019-04-08 MED ORDER — DEXTROSE 5 % IV SOLN
3.0000 g | INTRAVENOUS | Status: AC
  Administered 2019-04-11 (×2): 3 g via INTRAVENOUS
  Filled 2019-04-08: qty 3
  Filled 2019-04-08: qty 3000

## 2019-04-10 NOTE — Progress Notes (Signed)
Thomas Mcclure, the spouse of Mr. Corbett Spittle made aware to have the pt arrive for surgery tomorrow at 0830.

## 2019-04-11 ENCOUNTER — Inpatient Hospital Stay (HOSPITAL_COMMUNITY)
Admission: RE | Admit: 2019-04-11 | Discharge: 2019-04-15 | DRG: 460 | Disposition: A | Discharge status: Home / Self Care | Payer: Medicare Other | Attending: Neurosurgery | Admitting: Neurosurgery

## 2019-04-11 ENCOUNTER — Other Ambulatory Visit: Payer: Self-pay

## 2019-04-11 ENCOUNTER — Encounter (HOSPITAL_COMMUNITY): Payer: Self-pay | Admitting: Neurosurgery

## 2019-04-11 ENCOUNTER — Encounter (HOSPITAL_COMMUNITY)
Admission: RE | Disposition: A | Discharge status: Home / Self Care | Payer: Self-pay | Source: Home / Self Care | Attending: Neurosurgery

## 2019-04-11 ENCOUNTER — Inpatient Hospital Stay (HOSPITAL_COMMUNITY): Payer: Medicare Other | Admitting: Anesthesiology

## 2019-04-11 ENCOUNTER — Inpatient Hospital Stay (HOSPITAL_COMMUNITY): Payer: Medicare Other

## 2019-04-11 DIAGNOSIS — K08409 Partial loss of teeth, unspecified cause, unspecified class: Secondary | ICD-10-CM | POA: Diagnosis present

## 2019-04-11 DIAGNOSIS — I255 Ischemic cardiomyopathy: Secondary | ICD-10-CM | POA: Diagnosis present

## 2019-04-11 DIAGNOSIS — M069 Rheumatoid arthritis, unspecified: Secondary | ICD-10-CM | POA: Diagnosis present

## 2019-04-11 DIAGNOSIS — E119 Type 2 diabetes mellitus without complications: Secondary | ICD-10-CM | POA: Diagnosis present

## 2019-04-11 DIAGNOSIS — M48062 Spinal stenosis, lumbar region with neurogenic claudication: Secondary | ICD-10-CM

## 2019-04-11 DIAGNOSIS — Z8249 Family history of ischemic heart disease and other diseases of the circulatory system: Secondary | ICD-10-CM

## 2019-04-11 DIAGNOSIS — M40204 Unspecified kyphosis, thoracic region: Secondary | ICD-10-CM | POA: Diagnosis present

## 2019-04-11 DIAGNOSIS — Z91048 Other nonmedicinal substance allergy status: Secondary | ICD-10-CM

## 2019-04-11 DIAGNOSIS — E785 Hyperlipidemia, unspecified: Secondary | ICD-10-CM | POA: Diagnosis present

## 2019-04-11 DIAGNOSIS — Z87891 Personal history of nicotine dependence: Secondary | ICD-10-CM | POA: Diagnosis not present

## 2019-04-11 DIAGNOSIS — S32009K Unspecified fracture of unspecified lumbar vertebra, subsequent encounter for fracture with nonunion: Secondary | ICD-10-CM | POA: Diagnosis present

## 2019-04-11 DIAGNOSIS — I509 Heart failure, unspecified: Secondary | ICD-10-CM | POA: Diagnosis present

## 2019-04-11 DIAGNOSIS — M960 Pseudarthrosis after fusion or arthrodesis: Secondary | ICD-10-CM | POA: Diagnosis present

## 2019-04-11 DIAGNOSIS — I252 Old myocardial infarction: Secondary | ICD-10-CM | POA: Diagnosis not present

## 2019-04-11 DIAGNOSIS — K219 Gastro-esophageal reflux disease without esophagitis: Secondary | ICD-10-CM | POA: Diagnosis present

## 2019-04-11 DIAGNOSIS — T84216A Breakdown (mechanical) of internal fixation device of vertebrae, initial encounter: Secondary | ICD-10-CM | POA: Diagnosis present

## 2019-04-11 DIAGNOSIS — I110 Hypertensive heart disease with heart failure: Secondary | ICD-10-CM | POA: Diagnosis present

## 2019-04-11 DIAGNOSIS — Z888 Allergy status to other drugs, medicaments and biological substances status: Secondary | ICD-10-CM | POA: Diagnosis not present

## 2019-04-11 DIAGNOSIS — Z419 Encounter for procedure for purposes other than remedying health state, unspecified: Secondary | ICD-10-CM

## 2019-04-11 DIAGNOSIS — Z79899 Other long term (current) drug therapy: Secondary | ICD-10-CM

## 2019-04-11 DIAGNOSIS — I2510 Atherosclerotic heart disease of native coronary artery without angina pectoris: Secondary | ICD-10-CM | POA: Diagnosis present

## 2019-04-11 DIAGNOSIS — Z9682 Presence of neurostimulator: Secondary | ICD-10-CM | POA: Diagnosis not present

## 2019-04-11 DIAGNOSIS — G8929 Other chronic pain: Secondary | ICD-10-CM | POA: Diagnosis present

## 2019-04-11 DIAGNOSIS — M5416 Radiculopathy, lumbar region: Secondary | ICD-10-CM

## 2019-04-11 DIAGNOSIS — Y838 Other surgical procedures as the cause of abnormal reaction of the patient, or of later complication, without mention of misadventure at the time of the procedure: Secondary | ICD-10-CM | POA: Diagnosis present

## 2019-04-11 DIAGNOSIS — Z955 Presence of coronary angioplasty implant and graft: Secondary | ICD-10-CM

## 2019-04-11 DIAGNOSIS — Z7982 Long term (current) use of aspirin: Secondary | ICD-10-CM

## 2019-04-11 HISTORY — PX: LUMBAR FUSION: SHX111

## 2019-04-11 LAB — HEMOGLOBIN A1C
Hgb A1c MFr Bld: 7.5 % — ABNORMAL HIGH (ref 4.8–5.6)
Mean Plasma Glucose: 168.55 mg/dL

## 2019-04-11 LAB — GLUCOSE, CAPILLARY
Glucose-Capillary: 110 mg/dL — ABNORMAL HIGH (ref 70–99)
Glucose-Capillary: 96 mg/dL (ref 70–99)
Glucose-Capillary: 105 mg/dL — ABNORMAL HIGH (ref 70–99)
Glucose-Capillary: 105 mg/dL — ABNORMAL HIGH (ref 70–99)
Glucose-Capillary: 185 mg/dL — ABNORMAL HIGH (ref 70–99)

## 2019-04-11 SURGERY — POSTERIOR LUMBAR FUSION 1 WITH HARDWARE REMOVAL
Anesthesia: General | Site: Spine Thoracic

## 2019-04-11 MED ORDER — SODIUM CHLORIDE 0.9 % IV SOLN
250.0000 mL | INTRAVENOUS | Status: DC
  Administered 2019-04-11: 250 mL via INTRAVENOUS

## 2019-04-11 MED ORDER — ONDANSETRON HCL 4 MG/2ML IJ SOLN
INTRAMUSCULAR | Status: AC
  Filled 2019-04-11: qty 2

## 2019-04-11 MED ORDER — ONDANSETRON HCL 4 MG PO TABS: 4.0000 mg | ORAL_TABLET | Freq: Four times a day (QID) | ORAL | Status: DC | PRN

## 2019-04-11 MED ORDER — ACETAMINOPHEN 500 MG PO TABS
1000.0000 mg | ORAL_TABLET | Freq: Once | ORAL | Status: AC
  Administered 2019-04-11: 1000 mg via ORAL
  Filled 2019-04-11: qty 2

## 2019-04-11 MED ORDER — PANTOPRAZOLE SODIUM 40 MG PO TBEC
40.0000 mg | DELAYED_RELEASE_TABLET | Freq: Every day | ORAL | Status: DC
  Administered 2019-04-12 – 2019-04-15 (×4): 40 mg via ORAL
  Filled 2019-04-11 (×4): qty 1

## 2019-04-11 MED ORDER — ACETAMINOPHEN 650 MG RE SUPP: 650.0000 mg | RECTAL | Status: DC | PRN

## 2019-04-11 MED ORDER — HYDROMORPHONE HCL 1 MG/ML IJ SOLN
INTRAMUSCULAR | Status: AC
  Filled 2019-04-11: qty 0.5

## 2019-04-11 MED ORDER — CYCLOBENZAPRINE HCL 10 MG PO TABS
10.0000 mg | ORAL_TABLET | Freq: Three times a day (TID) | ORAL | Status: DC | PRN
  Administered 2019-04-11 – 2019-04-15 (×6): 10 mg via ORAL
  Filled 2019-04-11 (×6): qty 1

## 2019-04-11 MED ORDER — FENTANYL CITRATE (PF) 100 MCG/2ML IJ SOLN: 25.0000 ug | INTRAMUSCULAR | Status: DC | PRN

## 2019-04-11 MED ORDER — LIDOCAINE-EPINEPHRINE 1 %-1:100000 IJ SOLN
INTRAMUSCULAR | Status: AC
  Filled 2019-04-11: qty 1

## 2019-04-11 MED ORDER — CHLORHEXIDINE GLUCONATE CLOTH 2 % EX PADS: 6.0000 | MEDICATED_PAD | Freq: Once | CUTANEOUS | Status: DC

## 2019-04-11 MED ORDER — FENTANYL CITRATE (PF) 100 MCG/2ML IJ SOLN
INTRAMUSCULAR | Status: AC
  Filled 2019-04-11: qty 2

## 2019-04-11 MED ORDER — DEXMEDETOMIDINE HCL IN NACL 200 MCG/50ML IV SOLN
INTRAVENOUS | Status: DC | PRN
  Administered 2019-04-11: 8 ug via INTRAVENOUS

## 2019-04-11 MED ORDER — DULOXETINE HCL 60 MG PO CPEP
60.0000 mg | ORAL_CAPSULE | Freq: Every day | ORAL | Status: DC
  Administered 2019-04-12 – 2019-04-15 (×4): 60 mg via ORAL
  Filled 2019-04-11 (×4): qty 1

## 2019-04-11 MED ORDER — KETAMINE HCL 10 MG/ML IJ SOLN
INTRAMUSCULAR | Status: DC | PRN
  Administered 2019-04-11: 20 mg via INTRAVENOUS
  Administered 2019-04-11: 30 mg via INTRAVENOUS

## 2019-04-11 MED ORDER — NITROGLYCERIN 0.4 MG SL SUBL: 0.4000 mg | SUBLINGUAL_TABLET | SUBLINGUAL | Status: DC | PRN

## 2019-04-11 MED ORDER — CARVEDILOL 12.5 MG PO TABS
12.5000 mg | ORAL_TABLET | Freq: Two times a day (BID) | ORAL | Status: DC
  Administered 2019-04-12 – 2019-04-15 (×7): 12.5 mg via ORAL
  Filled 2019-04-11 (×7): qty 1

## 2019-04-11 MED ORDER — PHENOL 1.4 % MT LIQD: 1.0000 | OROMUCOSAL | Status: DC | PRN

## 2019-04-11 MED ORDER — THROMBIN 5000 UNITS EX SOLR
CUTANEOUS | Status: AC
  Filled 2019-04-11: qty 5000

## 2019-04-11 MED ORDER — FENTANYL CITRATE (PF) 100 MCG/2ML IJ SOLN
25.0000 ug | INTRAMUSCULAR | Status: DC | PRN
  Administered 2019-04-11 (×4): 50 ug via INTRAVENOUS

## 2019-04-11 MED ORDER — OXYCODONE HCL 5 MG PO TABS
20.0000 mg | ORAL_TABLET | ORAL | Status: DC | PRN
  Administered 2019-04-11 – 2019-04-15 (×20): 20 mg via ORAL
  Filled 2019-04-11 (×20): qty 4

## 2019-04-11 MED ORDER — CHLORTHALIDONE 25 MG PO TABS
25.0000 mg | ORAL_TABLET | Freq: Every day | ORAL | Status: DC
  Administered 2019-04-12 – 2019-04-15 (×4): 25 mg via ORAL
  Filled 2019-04-11 (×4): qty 1

## 2019-04-11 MED ORDER — ROCURONIUM BROMIDE 50 MG/5ML IV SOSY
PREFILLED_SYRINGE | INTRAVENOUS | Status: DC | PRN
  Administered 2019-04-11: 20 mg via INTRAVENOUS
  Administered 2019-04-11: 60 mg via INTRAVENOUS
  Administered 2019-04-11: 40 mg via INTRAVENOUS
  Administered 2019-04-11 (×3): 20 mg via INTRAVENOUS
  Administered 2019-04-11: 30 mg via INTRAVENOUS

## 2019-04-11 MED ORDER — BACITRACIN ZINC 500 UNIT/GM EX OINT
TOPICAL_OINTMENT | CUTANEOUS | Status: DC | PRN
  Administered 2019-04-11: 1 via TOPICAL

## 2019-04-11 MED ORDER — ESMOLOL HCL 100 MG/10ML IV SOLN
INTRAVENOUS | Status: AC
  Filled 2019-04-11: qty 10

## 2019-04-11 MED ORDER — FENTANYL CITRATE (PF) 250 MCG/5ML IJ SOLN
INTRAMUSCULAR | Status: AC
  Filled 2019-04-11: qty 5

## 2019-04-11 MED ORDER — PROPOFOL 10 MG/ML IV BOLUS
INTRAVENOUS | Status: DC | PRN
  Administered 2019-04-11: 140 mg via INTRAVENOUS

## 2019-04-11 MED ORDER — POLYETHYLENE GLYCOL 3350 17 G PO PACK
17.0000 g | PACK | Freq: Every day | ORAL | Status: DC
  Administered 2019-04-12 – 2019-04-15 (×4): 17 g via ORAL
  Filled 2019-04-11 (×4): qty 1

## 2019-04-11 MED ORDER — POTASSIUM CHLORIDE CRYS ER 20 MEQ PO TBCR
20.0000 meq | EXTENDED_RELEASE_TABLET | Freq: Every day | ORAL | Status: DC
  Administered 2019-04-12 – 2019-04-15 (×4): 20 meq via ORAL
  Filled 2019-04-11 (×4): qty 1

## 2019-04-11 MED ORDER — ACETAMINOPHEN 500 MG PO TABS
1000.0000 mg | ORAL_TABLET | Freq: Four times a day (QID) | ORAL | Status: AC
  Administered 2019-04-11 – 2019-04-12 (×3): 1000 mg via ORAL
  Filled 2019-04-11 (×3): qty 2

## 2019-04-11 MED ORDER — SODIUM CHLORIDE 0.9% FLUSH
3.0000 mL | Freq: Two times a day (BID) | INTRAVENOUS | Status: DC
  Administered 2019-04-11 – 2019-04-15 (×7): 3 mL via INTRAVENOUS

## 2019-04-11 MED ORDER — MENTHOL 3 MG MT LOZG: 1.0000 | LOZENGE | OROMUCOSAL | Status: DC | PRN

## 2019-04-11 MED ORDER — MORPHINE SULFATE (PF) 4 MG/ML IV SOLN
4.0000 mg | INTRAVENOUS | Status: DC | PRN
  Administered 2019-04-11 – 2019-04-12 (×5): 4 mg via INTRAVENOUS
  Filled 2019-04-11 (×5): qty 1

## 2019-04-11 MED ORDER — FENTANYL CITRATE (PF) 100 MCG/2ML IJ SOLN
50.0000 ug | Freq: Once | INTRAMUSCULAR | Status: AC
  Administered 2019-04-11: 50 ug via INTRAVENOUS

## 2019-04-11 MED ORDER — HYDROMORPHONE HCL 1 MG/ML IJ SOLN
INTRAMUSCULAR | Status: DC | PRN
  Administered 2019-04-11: 1 mg via INTRAVENOUS

## 2019-04-11 MED ORDER — ATORVASTATIN CALCIUM 80 MG PO TABS
80.0000 mg | ORAL_TABLET | Freq: Every day | ORAL | Status: DC
  Administered 2019-04-12 – 2019-04-14 (×3): 80 mg via ORAL
  Filled 2019-04-11 (×3): qty 1

## 2019-04-11 MED ORDER — PROMETHAZINE HCL 25 MG/ML IJ SOLN
6.2500 mg | INTRAMUSCULAR | Status: DC | PRN
  Administered 2019-04-11: 12.5 mg via INTRAVENOUS

## 2019-04-11 MED ORDER — SODIUM CHLORIDE 0.9% FLUSH: 3.0000 mL | INTRAVENOUS | Status: DC | PRN

## 2019-04-11 MED ORDER — ONDANSETRON HCL 4 MG/2ML IJ SOLN: 4.0000 mg | Freq: Once | INTRAMUSCULAR | Status: DC | PRN

## 2019-04-11 MED ORDER — INSULIN ASPART 100 UNIT/ML ~~LOC~~ SOLN
0.0000 [IU] | SUBCUTANEOUS | Status: DC
  Administered 2019-04-11 – 2019-04-12 (×2): 4 [IU] via SUBCUTANEOUS
  Administered 2019-04-12 (×2): 3 [IU] via SUBCUTANEOUS
  Administered 2019-04-12: 4 [IU] via SUBCUTANEOUS
  Administered 2019-04-13 – 2019-04-14 (×5): 3 [IU] via SUBCUTANEOUS
  Administered 2019-04-14: 4 [IU] via SUBCUTANEOUS
  Administered 2019-04-14 – 2019-04-15 (×3): 3 [IU] via SUBCUTANEOUS

## 2019-04-11 MED ORDER — LISINOPRIL 20 MG PO TABS
20.0000 mg | ORAL_TABLET | Freq: Every day | ORAL | Status: DC
  Administered 2019-04-12 – 2019-04-14 (×2): 20 mg via ORAL
  Filled 2019-04-11 (×3): qty 1

## 2019-04-11 MED ORDER — LUBIPROSTONE 24 MCG PO CAPS
24.0000 ug | ORAL_CAPSULE | Freq: Two times a day (BID) | ORAL | Status: DC
  Administered 2019-04-12 – 2019-04-15 (×7): 24 ug via ORAL
  Filled 2019-04-11 (×9): qty 1

## 2019-04-11 MED ORDER — LIDOCAINE-EPINEPHRINE 1 %-1:100000 IJ SOLN
INTRAMUSCULAR | Status: DC | PRN
  Administered 2019-04-11: 10 mL

## 2019-04-11 MED ORDER — KETAMINE HCL 50 MG/5ML IJ SOSY
PREFILLED_SYRINGE | INTRAMUSCULAR | Status: AC
  Filled 2019-04-11: qty 5

## 2019-04-11 MED ORDER — FENTANYL CITRATE (PF) 100 MCG/2ML IJ SOLN
INTRAMUSCULAR | Status: DC | PRN
  Administered 2019-04-11: 50 ug via INTRAVENOUS
  Administered 2019-04-11: 100 ug via INTRAVENOUS
  Administered 2019-04-11 (×3): 50 ug via INTRAVENOUS
  Administered 2019-04-11 (×2): 100 ug via INTRAVENOUS

## 2019-04-11 MED ORDER — MIDAZOLAM HCL 2 MG/2ML IJ SOLN
INTRAMUSCULAR | Status: AC
  Filled 2019-04-11: qty 2

## 2019-04-11 MED ORDER — LIDOCAINE 2% (20 MG/ML) 5 ML SYRINGE
INTRAMUSCULAR | Status: DC | PRN
  Administered 2019-04-11: 100 mg via INTRAVENOUS

## 2019-04-11 MED ORDER — ROCURONIUM BROMIDE 10 MG/ML (PF) SYRINGE
PREFILLED_SYRINGE | INTRAVENOUS | Status: AC
  Filled 2019-04-11: qty 10

## 2019-04-11 MED ORDER — SUGAMMADEX SODIUM 200 MG/2ML IV SOLN
INTRAVENOUS | Status: DC | PRN
  Administered 2019-04-11: 240 mg via INTRAVENOUS

## 2019-04-11 MED ORDER — PROPOFOL 10 MG/ML IV BOLUS
INTRAVENOUS | Status: AC
  Filled 2019-04-11: qty 20

## 2019-04-11 MED ORDER — BISACODYL 10 MG RE SUPP: 10.0000 mg | Freq: Every day | RECTAL | Status: DC | PRN

## 2019-04-11 MED ORDER — DILTIAZEM HCL ER COATED BEADS 180 MG PO CP24
180.0000 mg | ORAL_CAPSULE | Freq: Every day | ORAL | Status: DC
  Administered 2019-04-12 – 2019-04-15 (×4): 180 mg via ORAL
  Filled 2019-04-11 (×4): qty 1

## 2019-04-11 MED ORDER — GLYCOPYRROLATE PF 0.2 MG/ML IJ SOSY
PREFILLED_SYRINGE | INTRAMUSCULAR | Status: DC | PRN
  Administered 2019-04-11: .1 mg via INTRAVENOUS

## 2019-04-11 MED ORDER — PHENYLEPHRINE HCL-NACL 10-0.9 MG/250ML-% IV SOLN
INTRAVENOUS | Status: DC | PRN
  Administered 2019-04-11: 40 ug/min via INTRAVENOUS

## 2019-04-11 MED ORDER — PHENYLEPHRINE 40 MCG/ML (10ML) SYRINGE FOR IV PUSH (FOR BLOOD PRESSURE SUPPORT)
PREFILLED_SYRINGE | INTRAVENOUS | Status: AC
  Filled 2019-04-11: qty 10

## 2019-04-11 MED ORDER — GLYCOPYRROLATE PF 0.2 MG/ML IJ SOSY
PREFILLED_SYRINGE | INTRAMUSCULAR | Status: AC
  Filled 2019-04-11: qty 1

## 2019-04-11 MED ORDER — DOCUSATE SODIUM 100 MG PO CAPS
100.0000 mg | ORAL_CAPSULE | Freq: Two times a day (BID) | ORAL | Status: DC
  Administered 2019-04-11 – 2019-04-15 (×8): 100 mg via ORAL
  Filled 2019-04-11 (×8): qty 1

## 2019-04-11 MED ORDER — KETAMINE HCL 10 MG/ML IJ SOLN: INTRAMUSCULAR | Status: DC | PRN

## 2019-04-11 MED ORDER — LACTATED RINGERS IV SOLN: INTRAVENOUS | Status: DC

## 2019-04-11 MED ORDER — CEFAZOLIN SODIUM-DEXTROSE 2-4 GM/100ML-% IV SOLN
2.0000 g | Freq: Three times a day (TID) | INTRAVENOUS | Status: AC
  Administered 2019-04-11 – 2019-04-12 (×2): 2 g via INTRAVENOUS
  Filled 2019-04-11 (×2): qty 100

## 2019-04-11 MED ORDER — THROMBIN 5000 UNITS EX SOLR
OROMUCOSAL | Status: DC | PRN
  Administered 2019-04-11: 12:00:00 5 mL via TOPICAL

## 2019-04-11 MED ORDER — MIDAZOLAM HCL 5 MG/5ML IJ SOLN
INTRAMUSCULAR | Status: DC | PRN
  Administered 2019-04-11: 2 mg via INTRAVENOUS

## 2019-04-11 MED ORDER — CEFAZOLIN SODIUM 1 G IJ SOLR
INTRAMUSCULAR | Status: AC
  Filled 2019-04-11: qty 30

## 2019-04-11 MED ORDER — ESMOLOL HCL 100 MG/10ML IV SOLN
INTRAVENOUS | Status: DC | PRN
  Administered 2019-04-11 (×2): 30 mg via INTRAVENOUS

## 2019-04-11 MED ORDER — 0.9 % SODIUM CHLORIDE (POUR BTL) OPTIME
TOPICAL | Status: DC | PRN
  Administered 2019-04-11: 12:00:00 1000 mL

## 2019-04-11 MED ORDER — FENTANYL CITRATE (PF) 100 MCG/2ML IJ SOLN
50.0000 ug | Freq: Once | INTRAMUSCULAR | Status: AC
  Administered 2019-04-11: 50 ug via INTRAVENOUS
  Filled 2019-04-11: qty 2

## 2019-04-11 MED ORDER — SODIUM CHLORIDE 0.9 % IV SOLN
INTRAVENOUS | Status: DC | PRN
  Administered 2019-04-11: 13:00:00 500 mL

## 2019-04-11 MED ORDER — METFORMIN HCL ER 500 MG PO TB24
1000.0000 mg | ORAL_TABLET | Freq: Two times a day (BID) | ORAL | Status: DC
  Administered 2019-04-12 – 2019-04-15 (×7): 1000 mg via ORAL
  Filled 2019-04-11 (×9): qty 2

## 2019-04-11 MED ORDER — LIDOCAINE 2% (20 MG/ML) 5 ML SYRINGE
INTRAMUSCULAR | Status: AC
  Filled 2019-04-11: qty 5

## 2019-04-11 MED ORDER — ONDANSETRON HCL 4 MG/2ML IJ SOLN
4.0000 mg | Freq: Four times a day (QID) | INTRAMUSCULAR | Status: DC | PRN
  Administered 2019-04-14: 4 mg via INTRAVENOUS
  Filled 2019-04-11: qty 2

## 2019-04-11 MED ORDER — ACETAMINOPHEN 325 MG PO TABS
650.0000 mg | ORAL_TABLET | ORAL | Status: DC | PRN
  Administered 2019-04-13 – 2019-04-15 (×3): 650 mg via ORAL
  Filled 2019-04-11 (×3): qty 2

## 2019-04-11 MED ORDER — BACITRACIN ZINC 500 UNIT/GM EX OINT
TOPICAL_OINTMENT | CUTANEOUS | Status: AC
  Filled 2019-04-11: qty 28.35

## 2019-04-11 MED ORDER — ALBUMIN HUMAN 5 % IV SOLN: INTRAVENOUS | Status: DC | PRN

## 2019-04-11 MED ORDER — PROMETHAZINE HCL 25 MG/ML IJ SOLN
INTRAMUSCULAR | Status: AC
  Filled 2019-04-11: qty 1

## 2019-04-11 MED ORDER — GLIPIZIDE 5 MG PO TABS
5.0000 mg | ORAL_TABLET | Freq: Every day | ORAL | Status: DC
  Administered 2019-04-12 – 2019-04-15 (×4): 5 mg via ORAL
  Filled 2019-04-11 (×4): qty 1

## 2019-04-11 MED ORDER — LACTATED RINGERS IV SOLN: INTRAVENOUS | Status: DC | PRN

## 2019-04-11 MED ORDER — PREGABALIN 100 MG PO CAPS
200.0000 mg | ORAL_CAPSULE | Freq: Two times a day (BID) | ORAL | Status: DC
  Administered 2019-04-11 – 2019-04-15 (×8): 200 mg via ORAL
  Filled 2019-04-11 (×8): qty 2

## 2019-04-11 MED ORDER — PHENYLEPHRINE 40 MCG/ML (10ML) SYRINGE FOR IV PUSH (FOR BLOOD PRESSURE SUPPORT)
PREFILLED_SYRINGE | INTRAVENOUS | Status: DC | PRN
  Administered 2019-04-11: 120 ug via INTRAVENOUS
  Administered 2019-04-11 (×3): 80 ug via INTRAVENOUS

## 2019-04-11 MED ORDER — BUPIVACAINE LIPOSOME 1.3 % IJ SUSP
20.0000 mL | INTRAMUSCULAR | Status: AC
  Administered 2019-04-11: 16:00:00 20 mL
  Filled 2019-04-11: qty 20

## 2019-04-11 SURGICAL SUPPLY — 71 items
APL SKNCLS STERI-STRIP NONHPOA (GAUZE/BANDAGES/DRESSINGS) ×1
BAG DECANTER FOR FLEXI CONT (MISCELLANEOUS) ×3 IMPLANT
BENZOIN TINCTURE PRP APPL 2/3 (GAUZE/BANDAGES/DRESSINGS) ×3 IMPLANT
BLADE CLIPPER SURG (BLADE) IMPLANT
BUR MATCHSTICK NEURO 3.0 LAGG (BURR) ×3 IMPLANT
BUR PRECISION FLUTE 6.0 (BURR) ×3 IMPLANT
CANISTER SUCT 3000ML PPV (MISCELLANEOUS) ×3 IMPLANT
CARTRIDGE OIL MAESTRO DRILL (MISCELLANEOUS) ×1 IMPLANT
CLOSURE WOUND 1/2 X4 (GAUZE/BANDAGES/DRESSINGS) ×1
CNTNR URN SCR LID CUP LEK RST (MISCELLANEOUS) ×1 IMPLANT
CONNECTOR 6.35 6.35 (Connector) ×18 IMPLANT
CONNECTOR SPINAL CLOSE/OPEN (Connector) ×2 IMPLANT
CONT SPEC 4OZ STRL OR WHT (MISCELLANEOUS) ×6
COVER BACK TABLE 60X90IN (DRAPES) ×3 IMPLANT
COVER WAND RF STERILE (DRAPES) ×3 IMPLANT
DECANTER SPIKE VIAL GLASS SM (MISCELLANEOUS) ×3 IMPLANT
DIFFUSER DRILL AIR PNEUMATIC (MISCELLANEOUS) ×3 IMPLANT
DRAPE C-ARM 42X72 X-RAY (DRAPES) ×6 IMPLANT
DRAPE HALF SHEET 40X57 (DRAPES) ×3 IMPLANT
DRAPE LAPAROTOMY 100X72X124 (DRAPES) ×3 IMPLANT
DRAPE SURG 17X23 STRL (DRAPES) ×12 IMPLANT
DRSG OPSITE POSTOP 4X10 (GAUZE/BANDAGES/DRESSINGS) ×2 IMPLANT
DRSG OPSITE POSTOP 4X6 (GAUZE/BANDAGES/DRESSINGS) ×2 IMPLANT
ELECT BLADE 4.0 EZ CLEAN MEGAD (MISCELLANEOUS) ×3
ELECT REM PT RETURN 9FT ADLT (ELECTROSURGICAL) ×3
ELECTRODE BLDE 4.0 EZ CLN MEGD (MISCELLANEOUS) ×1 IMPLANT
ELECTRODE REM PT RTRN 9FT ADLT (ELECTROSURGICAL) ×1 IMPLANT
EVACUATOR 1/8 PVC DRAIN (DRAIN) ×2 IMPLANT
GAUZE 4X4 16PLY RFD (DISPOSABLE) ×1 IMPLANT
GAUZE SPONGE 4X4 12PLY STRL (GAUZE/BANDAGES/DRESSINGS) ×1 IMPLANT
GAUZE SPONGE 4X4 16PLY XRAY LF (GAUZE/BANDAGES/DRESSINGS) ×2 IMPLANT
GLOVE BIO SURGEON STRL SZ8 (GLOVE) ×6 IMPLANT
GLOVE BIO SURGEON STRL SZ8.5 (GLOVE) ×6 IMPLANT
GLOVE ECLIPSE 7.0 STRL STRAW (GLOVE) ×2 IMPLANT
GLOVE ECLIPSE 7.5 STRL STRAW (GLOVE) ×10 IMPLANT
GLOVE EXAM NITRILE XL STR (GLOVE) IMPLANT
GOWN STRL REUS W/ TWL LRG LVL3 (GOWN DISPOSABLE) IMPLANT
GOWN STRL REUS W/ TWL XL LVL3 (GOWN DISPOSABLE) ×2 IMPLANT
GOWN STRL REUS W/TWL 2XL LVL3 (GOWN DISPOSABLE) ×4 IMPLANT
GOWN STRL REUS W/TWL LRG LVL3 (GOWN DISPOSABLE) ×3
GOWN STRL REUS W/TWL XL LVL3 (GOWN DISPOSABLE) ×9
HEMOSTAT POWDER KIT SURGIFOAM (HEMOSTASIS) ×3 IMPLANT
KIT BASIN OR (CUSTOM PROCEDURE TRAY) ×3 IMPLANT
KIT INFUSE SMALL (Orthopedic Implant) ×2 IMPLANT
KIT TURNOVER KIT B (KITS) ×3 IMPLANT
MILL MEDIUM DISP (BLADE) ×1 IMPLANT
NDL HYPO 21X1.5 SAFETY (NEEDLE) IMPLANT
NEEDLE HYPO 21X1.5 SAFETY (NEEDLE) ×3 IMPLANT
NEEDLE HYPO 22GX1.5 SAFETY (NEEDLE) ×3 IMPLANT
NS IRRIG 1000ML POUR BTL (IV SOLUTION) ×3 IMPLANT
OIL CARTRIDGE MAESTRO DRILL (MISCELLANEOUS) ×3
PACK LAMINECTOMY NEURO (CUSTOM PROCEDURE TRAY) ×3 IMPLANT
PAD ARMBOARD 7.5X6 YLW CONV (MISCELLANEOUS) ×9 IMPLANT
PATTIES SURGICAL .5 X1 (DISPOSABLE) IMPLANT
PENCIL BUTTON HOLSTER BLD 10FT (ELECTRODE) ×2 IMPLANT
PUTTY DBM 10CC CALC GRAN (Putty) ×4 IMPLANT
ROD L635 HEX END UNLINED (Rod) ×6 IMPLANT
SCREW CONNECTOR SET (Screw) ×38 IMPLANT
SCREW SET BREAK OFF (Screw) ×18 IMPLANT
SPONGE LAP 4X18 RFD (DISPOSABLE) IMPLANT
SPONGE NEURO XRAY DETECT 1X3 (DISPOSABLE) IMPLANT
SPONGE SURGIFOAM ABS GEL 100 (HEMOSTASIS) IMPLANT
STRIP CLOSURE SKIN 1/2X4 (GAUZE/BANDAGES/DRESSINGS) ×2 IMPLANT
SUT VIC AB 1 CT1 18XBRD ANBCTR (SUTURE) ×2 IMPLANT
SUT VIC AB 1 CT1 8-18 (SUTURE) ×15
SUT VIC AB 2-0 CP2 18 (SUTURE) ×12 IMPLANT
SYR 20ML LL LF (SYRINGE) ×2 IMPLANT
TOWEL GREEN STERILE (TOWEL DISPOSABLE) ×3 IMPLANT
TOWEL GREEN STERILE FF (TOWEL DISPOSABLE) ×3 IMPLANT
TRAY FOLEY MTR SLVR 16FR STAT (SET/KITS/TRAYS/PACK) ×3 IMPLANT
WATER STERILE IRR 1000ML POUR (IV SOLUTION) ×3 IMPLANT

## 2019-04-11 NOTE — Progress Notes (Signed)
Subjective: The patient is somnolent but arousable.  He is in no apparent distress.  Objective: Vital signs in last 24 hours: Temp:  [98.7 F (37.1 C)-99.1 F (37.3 C)] 99.1 F (37.3 C) (02/15 1705) Pulse Rate:  [79-108] 102 (02/15 1750) Resp:  [8-20] 11 (02/15 1750) BP: (109-170)/(79-105) 126/89 (02/15 1750) SpO2:  [95 %-100 %] 96 % (02/15 1750) Estimated body mass index is 38.5 kg/m as calculated from the following:   Height as of 04/07/19: 5\' 10"  (1.778 m).   Weight as of 04/07/19: 121.7 kg.   Intake/Output from previous day: No intake/output data recorded. Intake/Output this shift: Total I/O In: 2550 [I.V.:2000; IV Piggyback:550] Out: 2010 [Urine:1360; Blood:650]  Physical exam the patient is somnolent but arousable.  He is moving all 4 extremities.  Lab Results: No results for input(s): WBC, HGB, HCT, PLT in the last 72 hours. BMET No results for input(s): NA, K, CL, CO2, GLUCOSE, BUN, CREATININE, CALCIUM in the last 72 hours.  Studies/Results: DG THORACOLUMABAR SPINE  Result Date: 04/11/2019 CLINICAL DATA:  Revision of thoracolumbar fusion. EXAM: THORACOLUMBAR SPINE 1V COMPARISON:  Intraoperative radiographs 02/09/2019 FINDINGS: The superior aspect of the construct is imaged. Hardware is intact. Single view is submitted. IMPRESSION: Single image of hardware revision without evidence for complication. Electronically Signed   By: San Morelle M.D.   On: 04/11/2019 16:51   DG C-Arm 1-60 Min  Result Date: 04/11/2019 CLINICAL DATA:  Revision of thoracolumbar fusion. EXAM: THORACOLUMBAR SPINE 1V COMPARISON:  Intraoperative radiographs 02/09/2019 FINDINGS: The superior aspect of the construct is imaged. Hardware is intact. Single view is submitted. IMPRESSION: Single image of hardware revision without evidence for complication. Electronically Signed   By: San Morelle M.D.   On: 04/11/2019 16:51    Assessment/Plan: The patient is doing well.  I spoke with the  patient's wife.  LOS: 0 days     [name] 04/11/2019, 5:59 PM

## 2019-04-11 NOTE — Progress Notes (Signed)
Patient c/o 10/10 back back pain. 50 mcg Fentanyl IV repeatedx1 per Dr. Gifford Shave. Patient still on monitor.   04/11/19 1120 04/11/19 1126  Vitals  Pulse Rate 93  --   Pulse Rate Source Monitor  --   ECG Heart Rate 84  --   Resp 14  --   Respiratory Pattern Regular  --   BP (!) 146/105  --   SpO2 95 %  --   Oxygen Therapy  O2 Device Room Air  --   Patient Activity (if Appropriate) In bed  --   Pain Assessment  Pain Scale  --  0-10  Pain Score  --  10  Pain Type  --  Chronic pain  Pain Location  --  Back  Pain Orientation  --  Lower  Pain Descriptors / Indicators  --  Sharp  Pain Onset  --  On-going  Pain Intervention(s)  --  Medication (See eMAR)  LOC  Level of Consciousness  --  Alert  Orientation Level  --  Oriented X4  Ald[place]te  Activity  --  2  Respiration  --  2  Circulation  --  2  Consciousness  --  2  Color  --  2  Aldrete Score  --  10

## 2019-04-11 NOTE — Progress Notes (Signed)
Patient c/o 8/10 back pain. Requested something for pain. Dr. Gifford Shave Notified. Telephone Order received for 41mcg Fentanyl, repeat x1 as needed. Patient on monitor.    04/11/19 0950  Vitals  Pulse Rate 88  ECG Heart Rate 90  Resp 17  BP (!) 157/94  SpO2 96 %  Pain Assessment  Pain Scale 0-10  Pain Score 8  Pain Type Chronic pain  Pain Location Back  Pain Orientation Lower  Pain Descriptors / Indicators Sharp  Patients Stated Pain Goal 2  Pain Intervention(s) Medication (See eMAR)  LOC  Level of Consciousness Alert  Orientation Level Oriented X4  Aldrete  Activity 2  Respiration 2  Circulation 2  Consciousness 2  Color 2  Aldrete Score 10

## 2019-04-11 NOTE — H&P (Signed)
Subjective: The patient is a 64 year old black male who has had multiple back surgeries. Most recently he had an extension of his fusion up to T10 with a subsequent revision of his spinal cord stimulator. The patient initially did well. Had a follow-up appointment he was noted to have fractured rods at the connectors and a thoracic kyphosis. I discussed the situation with him. He has weighed the risks, benefits and alternatives and decided to proceed with a revision of his thoracolumbar fusion.  Past Medical History:  Diagnosis Date  . Baker's cyst    Left calf  . CAD in native artery, 07/15/15 PCI of RCA with DES 07/16/2015   a.   NSTEMI 5/17: LHC - pLAD 20, pLCx 20, OM1 30, pRCA 100, EF 45-50%>> PCI: 2.5 x 24 mm Promus DES to RCA  //  b.   Echo 5/17: mild LVH, EF 50-55%, no RWMA, mod RVE //  c. LHC 6/17: pLAD 20, pLCx 20, OM1 50, pRCA stent ok, EF 35-45% with mod sized inf wall and basal segment aneurysm  . CHF (congestive heart failure) ([place])   . Chronic back pain    "down my back, down my legs" (02/18/2017)  . Constipation   . Constipation due to opioid therapy   . GERD (gastroesophageal reflux disease)    04/07/2019- not current  . Hyperlipidemia   . Hypertension    Dr. [name] [phone]  . Ischemic cardiomyopathy    a. LV-gram at time of LHC in 6/17 with EF 35-45%  //  b. Echo 7/17: EF 45-50%, inferior HK, grade 1 diastolic dysfunction, mildly dilated aortic root, moderately reduced RVSF, mild RAE  . Myocardial infarction ([place]) 2017  . Neuromuscular disorder ([place])    "with nerve damage"  . Numbness and tingling of both lower extremities    "on the outside of both sides" (02/18/2017)  . Rheumatoid arthritis ([facility])    RA  . Tachycardia   . Type II diabetes mellitus ([place])    diet controlled    Past Surgical History:  Procedure Laterality Date  . BACK SURGERY     x3  . CARDIAC CATHETERIZATION N/A 07/15/2015   Procedure: Left Heart Cath and Coronary Angiography;  Surgeon:  [name], MD;  Location: [facility];  Service: Cardiovascular;  Laterality: N/A;  . CARDIAC CATHETERIZATION N/A 07/15/2015   Procedure: Coronary Stent Intervention;  Surgeon: [name], MD;  Location: [facility];  Service: Cardiovascular;  Laterality: N/A;  . CARDIAC CATHETERIZATION N/A 08/07/2015   Procedure: Left Heart Cath and Coronary Angiography;  Surgeon: [name], MD;  Location: [facility];  Service: Cardiovascular;  Laterality: N/A;  . CORONARY ANGIOPLASTY WITH STENT PLACEMENT  07/2015  . LEFT HEART CATH AND CORONARY ANGIOGRAPHY N/A 02/19/2017   Procedure: LEFT HEART CATH AND CORONARY ANGIOGRAPHY;  Surgeon: Martinique, Peter M, MD;  Location: [facility];  Service: Cardiovascular;  Laterality: N/A;  . LUMBAR SPINE SURGERY  03/2009  & 2012  . MASS EXCISION N/A 04/19/2012   Procedure: removal of posterior cervical lipoma;  Surgeon: [name], MD;  Location: [facility];  Service: Neurosurgery;  Laterality: N/A;  Removal of posterior cervical lipoma  . POPLITEAL SYNOVIAL CYST EXCISION Left    "opened up behind my knee; scraped out arthritis"  . POSTERIOR LUMBAR FUSION 4 LEVEL N/A 11/22/2018   Procedure: Posterior Lateral and Interbody fusion - Lumbar one-Lumbar two; explore fusion, posterior instrumentation and fusion Thoracic ten  to the ilium;  Surgeon: Newman Pies, MD;  Location: [facility];  Service: Neurosurgery;  Laterality: N/A;  . SPINAL CORD STIMULATOR INSERTION N/A 01/07/2013   Procedure:  SPINAL CORD STIMULATOR INSERTION;  Surgeon: [name], MD;  Location: [facility];  Service: Neurosurgery;  Laterality: N/A;  . SPINAL CORD STIMULATOR INSERTION N/A 02/09/2019   Procedure: REPLACEMENT OF LUMBAR SPINAL CORD STIMULATOR;  Surgeon: Newman Pies, MD;  Location: [facility];  Service: Neurosurgery;  Laterality: N/A;  Thoracic/Lumbar spine  . TONSILLECTOMY     patient denies  . TOTAL HIP ARTHROPLASTY Right 10/03/2016   Procedure: TOTAL  HIP ARTHROPLASTY;  Surgeon: [name], MD;  Location: [facility];  Service: Orthopedics;  Laterality: Right;  . WISDOM TOOTH EXTRACTION     hx of    Allergies  Allergen Reactions  . Brilinta [Ticagrelor] Shortness Of Breath and Other (See Comments)    CHEST PAIN   . Methylprednisolone Other (See Comments)    DIAPHORESIS, HYPOTENSION  . Prednisone Other (See Comments)    DIAPHORESIS, HYPOTENSION  . Toradol [Ketorolac Tromethamine] Other (See Comments)    DIAPHORESIS, HYPOTENSION    . Adhesive [Tape] Rash and Other (See Comments)    "blisters" ( NO SURGICAL TAPE, PLEASE)    Social History   Tobacco Use  . Smoking status: Former Smoker    Packs/day: 0.25    Years: 40.00    Pack years: 10.00    Types: Cigarettes    Quit date: 2008    Years since quitting: 13.1  . Smokeless tobacco: Never Used  . Tobacco comment: 02/18/2017 "quit in 2012"  Substance Use Topics  . Alcohol use: No    Alcohol/week: 0.0 standard drinks    Family History  Problem Relation Age of Onset  . Heart disease Mother   . Prostate cancer Brother    Prior to Admission medications   Medication Sig Start Date End Date Taking? Authorizing Provider  acetaminophen (TYLENOL) 500 MG tablet Take 1,000 mg by mouth 2 (two) times daily as needed (pain.).   Yes [provider]  aspirin EC 81 MG tablet Take 1 tablet (81 mg total) by mouth daily. 07/18/15  Yes Burns, Arloa Koh, MD  atorvastatin (LIPITOR) 80 MG tablet Take 80 mg by mouth daily at 6 PM.   Yes [provider]  carvedilol (COREG) 12.5 MG tablet Take 1 tablet (12.5 mg total) by mouth 2 (two) times daily. 02/28/19 05/29/19 Yes Isaiah Serge, NP  celecoxib (CELEBREX) 400 MG capsule Take 400 mg by mouth daily. 04/06/19  Yes [provider]  chlorthalidone (HYGROTON) 25 MG tablet Take 1 tablet (25 mg total) by mouth daily. 05/27/16  Yes Burtis Junes, NP  cyclobenzaprine (FLEXERIL) 10 MG tablet Take 1 tablet (10 mg total) by mouth 3  (three) times daily as needed for muscle spasms. 02/10/19  Yes Viona Gilmore D, NP  DILT-XR 180 MG 24 hr capsule Take 180 mg by mouth daily.  04/09/16  Yes [provider]  docusate sodium (COLACE) 100 MG capsule Take 1 capsule (100 mg total) by mouth 2 (two) times daily. 02/10/19  Yes Viona Gilmore D, NP  DULoxetine (CYMBALTA) 60 MG capsule Take 1 capsule (60 mg total) by mouth daily. 11/06/17  Yes Marrian Salvage, FNP  glipiZIDE (GLUCOTROL) 5 MG tablet Take 5 mg by mouth daily before breakfast.    Yes [provider]  lisinopril (PRINIVIL,ZESTRIL) 20 MG tablet Take 20 mg by mouth at bedtime.  07/07/16  Yes [provider]  lubiprostone (AMITIZA) 24 MCG capsule Take 48 mcg by mouth at bedtime.    Yes [provider]  metFORMIN (GLUCOPHAGE XR) 500 MG 24 hr tablet Take 2 tablets (1,000 mg total) by mouth 2 (two) times daily. 11/09/17  Yes Marrian Salvage, FNP  Multiple Vitamin (MULTIVITAMIN WITH MINERALS) TABS tablet Take 1 tablet by mouth daily.   Yes [provider]  omeprazole (PRILOSEC) 40 MG capsule Take 1 capsule (40 mg total) by mouth daily. 11/06/17  Yes Marrian Salvage, FNP  Oxycodone HCl 20 MG TABS Take 1 tablet (20 mg total) by mouth 5 (five) times daily. 02/10/19  Yes Viona Gilmore D, NP  polyethylene glycol powder (GLYCOLAX/MIRALAX) powder Take 17 g by mouth daily. Mix in 8 oz water, juice, soda, coffee or tea and drink 11/06/17  Yes Marrian Salvage, FNP  potassium chloride SA (K-DUR,KLOR-CON) 20 MEQ tablet Take 1 tablet (20 mEq total) by mouth daily. 05/24/18  Yes [name], MD  pregabalin (LYRICA) 200 MG capsule Take 1 capsule (200 mg total) by mouth 2 (two) times daily. 08/02/18  Yes Bayard Hugger, NP  QC LIDOCAINE PAIN RELIEF EX Place 1 patch onto the skin daily as needed (pain.).   Yes [provider]  diclofenac sodium (VOLTAREN) 1 % GEL Apply 1 application topically 4 (four) times daily as needed  (pain.).  10/04/18   [provider]  nitroGLYCERIN (NITROSTAT) 0.4 MG SL tablet Place 1 tablet (0.4 mg total) under the tongue every 5 (five) minutes as needed for chest pain. 04/26/18   Isaiah Serge, NP  senna-docusate (SENOKOT S) 8.6-50 MG tablet Take 1 tablet by mouth 2 (two) times daily. Patient not taking: Reported on 04/07/2019 05/31/17   Eugenie Filler, MD     Review of Systems  Positive ROS: As above  All other systems have been reviewed and were otherwise negative with the exception of those mentioned in the HPI and as above.  Objective: Vital signs in last 24 hours: Temp:  [98.7 F (37.1 C)] 98.7 F (37.1 C) (02/15 0836) Pulse Rate:  [83-95] 83 (02/15 1020) Resp:  [14-20] 14 (02/15 1020) BP: (140-170)/(94-104) 140/94 (02/15 1020) SpO2:  [96 %-100 %] 97 % (02/15 1020) Estimated body mass index is 38.5 kg/m as calculated from the following:   Height as of 04/07/19: 5\' 10"  (1.778 m).   Weight as of 04/07/19: 121.7 kg.   General Appearance: Alert Head: Normocephalic, without obvious abnormality, atraumatic Eyes: PERRL, conjunctiva/corneas clear, EOM's intact,    Ears: Normal  Throat: Normal  Neck: Supple, Back: The patient's multiple incisions are well-healed. Lungs: Clear to auscultation bilaterally, respirations unlabored Heart: Regular rate and rhythm, no murmur, rub or gallop Abdomen: Soft, non-tender Extremities: Extremities normal, atraumatic, no cyanosis or edema Skin: unremarkable  NEUROLOGIC:   Mental status: alert and oriented,Motor Exam - grossly normal, with giveaway Sensory Exam - grossly normal Reflexes:  Coordination - grossly normal Gait -antalgic Balance - grossly normal Cranial Nerves: I: smell Not tested  II: visual acuity  OS: Normal  OD: Normal   II: visual fields Full to confrontation  II: pupils Equal, round, reactive to light  III,VII: ptosis None  III,IV,VI: extraocular muscles  Full ROM  V: mastication Normal  V: facial  light touch sensation  Normal  V,VII: corneal reflex  Present  VII: facial muscle function - upper  Normal  VII: facial muscle function - lower Normal  VIII: hearing Not  tested  IX: soft palate elevation  Normal  IX,X: gag reflex Present  XI: trapezius strength  5/5  XI: sternocleidomastoid strength 5/5  XI: neck flexion strength  5/5  XII: tongue strength  Normal    Data Review Lab Results  Component Value Date   WBC 7.8 04/07/2019   HGB 11.2 (L) 04/07/2019   HCT 37.1 (L) 04/07/2019   MCV 79.3 (L) 04/07/2019   PLT 373 04/07/2019   Lab Results  Component Value Date   NA 136 04/07/2019   K 4.1 04/07/2019   CL 98 04/07/2019   CO2 25 04/07/2019   BUN 14 04/07/2019   CREATININE 1.20 04/07/2019   GLUCOSE 122 (H) 04/07/2019   Lab Results  Component Value Date   INR 0.96 02/18/2017    Assessment/Plan: Thoracolumbar pseudoarthrosis, instrumentation failure, thoracic spine pain: I have discussed the situation with the patient. I reviewed his imaging studies with him and pointed out the abnormalities. We have discussed the various treatment options including a revision of his thoracolumbar instrumentation and fusion. I have described that surgery to him. We have discussed the risks, benefits, alternatives, expected postoperative course, and likelihood of achieving our goals with surgery. I have answered all his questions. He has decided to proceed with surgery.   [name] 04/11/2019 11:23 AM

## 2019-04-11 NOTE — Transfer of Care (Signed)
Immediate Anesthesia Transfer of Care Note  Patient: Thomas Mcclure  Procedure(s) Performed: REVISION OF THORACOLUMBAR FUSION (N/A Spine Thoracic)  Patient Location: PACU  Anesthesia Type:General  Level of Consciousness: awake, alert  and oriented  Airway & Oxygen Therapy: Patient Spontanous Breathing and Patient connected to face mask oxygen  Post-op Assessment: Report given to RN, Post -op Vital signs reviewed and stable and Patient moving all extremities  Post vital signs: Reviewed and stable  Last Vitals:  Vitals Value Taken Time  BP 109/79 04/11/19 1704  Temp    Pulse 103 04/11/19 1714  Resp 28 04/11/19 1714  SpO2 91 % 04/11/19 1714  Vitals shown include unvalidated device data.  Last Pain:  Vitals:   04/11/19 1126  PainSc: 10-Worst pain ever      Patients Stated Pain Goal: 2 ([id number])  Complications: No apparent anesthesia complications

## 2019-04-11 NOTE — Anesthesia Procedure Notes (Signed)
Procedure Name: Intubation Date/Time: 04/11/2019 11:55 AM Performed by: Orlie Dakin, CRNA Pre-anesthesia Checklist: Patient identified, Emergency Drugs available, Suction available and Patient being monitored Patient Re-evaluated:Patient Re-evaluated prior to induction Oxygen Delivery Method: Circle system utilized Preoxygenation: Pre-oxygenation with 100% oxygen Induction Type: IV induction Ventilation: Mask ventilation without difficulty and Oral airway inserted - appropriate to patient size Laryngoscope Size: Mac and 4 Grade View: Grade II Tube type: Oral Tube size: 7.5 mm Number of attempts: 1 Airway Equipment and Method: Stylet Placement Confirmation: ETT inserted through vocal cords under direct vision,  positive ETCO2 and breath sounds checked- equal and bilateral Secured at: 25 cm Tube secured with: Tape Dental Injury: Teeth and Oropharynx as per pre-operative assessment

## 2019-04-11 NOTE — Anesthesia Preprocedure Evaluation (Addendum)
Anesthesia Evaluation  Patient identified by MRN, date of birth, ID band Patient awake    Reviewed: Allergy & Precautions, NPO status , Patient's Chart, lab work & pertinent test results, reviewed documented beta blocker date and time   Airway Mallampati: II  TM Distance: >3 FB Neck ROM: Full    Dental  (+) Dental Advisory Given, Edentulous Upper   Pulmonary former smoker,    Pulmonary exam normal breath sounds clear to auscultation       Cardiovascular hypertension, Pt. on home beta blockers and Pt. on medications + angina + CAD, + Past MI, + Cardiac Stents and +CHF  Normal cardiovascular exam Rhythm:Regular Rate:Normal  Echo 10/15/2018: IMPRESSIONS    1. The left ventricle has mildly reduced systolic function, with an ejection fraction of 45-50%. The cavity size was normal. There is mildly increased left ventricular wall thickness. Left ventricular diastolic Doppler parameters are consistent with impaired relaxation.  2. Abnormal septal motion inferior basal hypokinesis.  3. The right ventricle has normal systolic function. The cavity was normal. There is no increase in right ventricular wall thickness.  4. Mild thickening of the mitral valve leaflet. Mild calcification of the mitral valve leaflet. There is mild mitral annular calcification present.  5. The aortic valve is tricuspid. Mild thickening of the aortic valve. Sclerosis without any evidence of stenosis of the aortic valve.  6. The aorta is normal unless otherwise noted.  7. The interatrial septum was not well visualized.   Neuro/Psych LUMBAR PSEUDOARTHROSIS  Neuromuscular disease    GI/Hepatic Neg liver ROS, GERD  Medicated and Controlled,  Endo/Other  diabetes, Well Controlled, Type 2, Oral Hypoglycemic AgentsObesity   Renal/GU negative Renal ROS     Musculoskeletal  (+) Arthritis , Rheumatoid disorders,    Abdominal   Peds  Hematology negative  hematology ROS (+)   Anesthesia Other Findings Day of surgery medications reviewed with the patient.  Reproductive/Obstetrics                            Anesthesia Physical Anesthesia Plan  ASA: III  Anesthesia Plan: General   Post-op Pain Management:    Induction: Intravenous  PONV Risk Score and Plan: 3 and Midazolam, Dexamethasone and Ondansetron  Airway Management Planned: Oral ETT  Additional Equipment:   Intra-op Plan:   Post-operative Plan: Extubation in OR  Informed Consent: I have reviewed the patients History and Physical, chart, labs and discussed the procedure including the risks, benefits and alternatives for the proposed anesthesia with the patient or authorized representative who has indicated his/her understanding and acceptance.     Dental advisory given  Plan Discussed with: CRNA  Anesthesia Plan Comments: (2nd PIV)       Anesthesia Quick Evaluation

## 2019-04-11 NOTE — Anesthesia Postprocedure Evaluation (Signed)
Anesthesia Post Note  Patient: Thomas Mcclure  Procedure(s) Performed: REVISION OF THORACOLUMBAR FUSION (N/A Spine Thoracic)     Patient location during evaluation: PACU Anesthesia Type: General Level of consciousness: awake and alert Pain management: pain level controlled Vital Signs Assessment: post-procedure vital signs reviewed and stable Respiratory status: spontaneous breathing, nonlabored ventilation, respiratory function stable and patient connected to nasal cannula oxygen Cardiovascular status: blood pressure returned to baseline and stable Postop Assessment: no apparent nausea or vomiting Anesthetic complications: no    Last Vitals:  Vitals:   04/11/19 1839 04/11/19 1958  BP: 113/79 124/87  Pulse: (!) 101 99  Resp: 17 18  Temp: (!) 36.4 C 36.9 C  SpO2: 97% 99%    Last Pain:  Vitals:   04/11/19 1958  TempSrc: Oral  PainSc:                  Catalina Gravel

## 2019-04-11 NOTE — Op Note (Signed)
Brief history: The patient is a 64 year old black male who is had multiple prior back surgeries.  I performed an extension of his lumbar fusion to T10 previously.  The patient initially did well but had a postop visit was noted to have a bilateral fractured rods.  I discussed that situation with the patient.  We discussed the various treatment options including surgery.  He has weighed the risks, benefits and alternatives of surgery and decided to proceed with a revision of his thoracolumbar fusion.  Preop diagnosis: Lumbar pseudoarthrosis, chronic low back pain, instrumentation failure  Postop diagnosis: The same  Procedure: Exploration of thoracolumbar fusion; removal of thoracolumbar hardware; redo posterior lateral arthrodesis T10-11, T11-12, T12-L1, L1-2 with local morselized autograft bone, Zimmer bone graft extender, and bone morphogenic protein soaked collagen sponges; posterior thoracolumbar instrumentation T10-L4 bilaterally  Surgeon: Dr. Earle Gell  Assistant: Arnetha Massy nurse practitioner  Anesthesia: General tracheal  Estimated blood loss: 200 cc  Specimens: None  Drains: 1 medium Hemovac in the prevertebral space.  Complications: None  Description of procedure: The patient was brought to the operating room by the anesthesia team.  General endotracheal anesthesia was induced.  The patient was turned to the prone position on the [place] table.  He was appropriately padded.  The patient's thoracolumbar region was then prepared with Betadine scrub and Betadine solution.  Sterile drapes were applied.  I then injected the area to be incised with Marcaine with epinephrine solution.  I then used a scalpel to make a midline incision through his previous surgical scar from approximately L4-T10.  We use electrocautery to perform a bilateral subperiosteal dissection dissecting through the scar tissue and exposing the instrumentation from T10-L4.  We inserted the Versatrac retractor for  exposure.  We explored the fusion by removing the caps from the pedicle screws bilaterally at T10, T11, T12, and L1.  We then remove the connectors and then remove the rods.  As expected the upper rods were fractured at the connectors.  I inspected the arthrodesis.  The screws were not loose, but I cannot see any clear bony fusion.  We now turned our attention to the redo posterior lateral arthrodesis at T10-11, T11-12, T12-L1 and L1-2.  We used electrocautery to expose the remainder of the facets, pars, transverse process etc. at these levels.  We cleared the soft tissue.  We then used a high-speed drill to decorticate.  We then laid a combination of local morselized autograft bone we obtained during the drilling, intragrow Zimmer bone graft extender, and bone morphogenic protein soaked collagen sponges over these decorticated posterior lateral structures at T10-11, T11-12, T12-L1, and L1-2.  We now turned our attention to the posterior instrumentation.  In order to decrease the chance of another rod fracture I placed 2 rods on each side from T10 and extending to the lumbar rods down to L4.  I secured these rods using into and and side to side connectors.  In order to place one of the lower connectors we had to remove the cross connector from the lower lumbar rod.  We secured the connectors in place with the snap off caps.  We then obtained hemostasis using electrocautery.  We placed a Hemovac drain and tunneled it out through a separate stab wound.  We injected Exparel.  We then remove the retractors and reapproximated patient's thoracolumbar fascia with interrupted 0 Vicryl suture.  We reapproximated the subcutaneous tissue with interrupted 2-0 Vicryl suture.  We reapproximated the skin with Steri-Strips and benzoin.  The wound was then coated with bacitracin ointment.  A sterile dressing was applied.  The drapes were removed.  By report all sponge, instrument, and needle counts were correct at the end  this case.

## 2019-04-12 ENCOUNTER — Encounter (HOSPITAL_COMMUNITY): Payer: Self-pay | Admitting: Neurosurgery

## 2019-04-12 LAB — CBC
HCT: 31.8 % — ABNORMAL LOW (ref 39.0–52.0)
Hemoglobin: 9.9 g/dL — ABNORMAL LOW (ref 13.0–17.0)
MCH: 24.1 pg — ABNORMAL LOW (ref 26.0–34.0)
MCHC: 31.1 g/dL (ref 30.0–36.0)
MCV: 77.4 fL — ABNORMAL LOW (ref 80.0–100.0)
Platelets: 288 10*3/uL (ref 150–400)
RBC: 4.11 MIL/uL — ABNORMAL LOW (ref 4.22–5.81)
RDW: 19.3 % — ABNORMAL HIGH (ref 11.5–15.5)
WBC: 10.1 10*3/uL (ref 4.0–10.5)
nRBC: 0 % (ref 0.0–0.2)

## 2019-04-12 LAB — BASIC METABOLIC PANEL
Anion gap: 10 (ref 5–15)
BUN: 7 mg/dL — ABNORMAL LOW (ref 8–23)
CO2: 29 mmol/L (ref 22–32)
Calcium: 8.9 mg/dL (ref 8.9–10.3)
Chloride: 101 mmol/L (ref 98–111)
Creatinine, Ser: 1.02 mg/dL (ref 0.61–1.24)
GFR calc Af Amer: >60 mL/min (ref >60)
GFR calc non Af Amer: >60 mL/min (ref >60)
Glucose, Bld: 148 mg/dL — ABNORMAL HIGH (ref 70–99)
Potassium: 4 mmol/L (ref 3.5–5.1)
Sodium: 140 mmol/L (ref 135–145)

## 2019-04-12 LAB — GLUCOSE, CAPILLARY
Glucose-Capillary: 116 mg/dL — ABNORMAL HIGH (ref 70–99)
Glucose-Capillary: 116 mg/dL — ABNORMAL HIGH (ref 70–99)
Glucose-Capillary: 125 mg/dL — ABNORMAL HIGH (ref 70–99)
Glucose-Capillary: 143 mg/dL — ABNORMAL HIGH (ref 70–99)
Glucose-Capillary: 154 mg/dL — ABNORMAL HIGH (ref 70–99)
Glucose-Capillary: 197 mg/dL — ABNORMAL HIGH (ref 70–99)

## 2019-04-12 MED ORDER — HYDROMORPHONE HCL 1 MG/ML IJ SOLN
1.0000 mg | INTRAMUSCULAR | Status: DC | PRN
  Administered 2019-04-12 – 2019-04-14 (×10): 1 mg via INTRAVENOUS
  Filled 2019-04-12 (×10): qty 1

## 2019-04-12 NOTE — Plan of Care (Signed)
  Problem: Activity: Goal: Risk for activity intolerance will decrease Outcome: Progressing   Problem: Coping: Goal: Level of anxiety will decrease Outcome: Progressing   Problem: Pain Managment: Goal: General experience of comfort will improve Outcome: Progressing   Problem: Safety: Goal: Ability to remain free from injury will improve Outcome: Progressing   Problem: Skin Integrity: Goal: Risk for impaired skin integrity will decrease Outcome: Progressing   

## 2019-04-12 NOTE — Progress Notes (Signed)
PT Cancellation Note  Patient Details Name: Thomas Mcclure MRN: [id number] DOB: [birth year]   Cancelled Treatment:    Reason Eval/Treat Not Completed: Pain limiting ability to participate(pt reports pain 8/10 and denies mobility until after next pain medication)   Winda Summerall B Hamsa Laurich 04/12/2019, 7:42 AM Bayard Males, PT Acute Rehabilitation Services Pager: [phone] Office: [phone]

## 2019-04-12 NOTE — Progress Notes (Signed)
OT Cancellation Note  Patient Details Name: Thomas Mcclure MRN: [id number] DOB: September 20, 1955   Cancelled Treatment:    Reason Eval/Treat Not Completed: Pain limiting ability to participate Pt requested OT return at later time due to elevated pain level, pt wincing at rest. Pt just returned to bed prior to OT arrival, per RN received pain medication around 10:30am. OT will return later as time allows and pt is appropriate.   Dorinda Hill OTR/L Acute Rehabilitation Services Office: [place] 04/12/2019, 10:54 AM

## 2019-04-12 NOTE — Progress Notes (Signed)
Subjective: The patient is alert and pleasant.  He does not feel his spinal cord stimulator is functioning.  The rep is coming by later on.  Objective: Vital signs in last 24 hours: Temp:  [97.5 F (36.4 C)-99.1 F (37.3 C)] 98.5 F (36.9 C) (02/16 0437) Pulse Rate:  [79-108] 108 (02/16 0437) Resp:  [8-20] 19 (02/16 0437) BP: (109-170)/(77-105) 114/77 (02/16 0437) SpO2:  [94 %-100 %] 94 % (02/16 0437) Estimated body mass index is 38.5 kg/m as calculated from the following:   Height as of 04/07/19: 5\' 10"  (1.778 m).   Weight as of 04/07/19: 121.7 kg.   Intake/Output from previous day: 02/15 0701 - 02/16 0700 In: 2550 [I.V.:2000; IV Piggyback:550] Out: [id number] [Urine:2935; Drains:560; Blood:650] Intake/Output this shift: No intake/output data recorded.  Physical exam patient is alert and pleasant.  He is moving his lower extremities well.  Lab Results: Recent Labs    04/12/19 0307  WBC 10.1  HGB 9.9*  HCT 31.8*  PLT 288   BMET Recent Labs    04/12/19 0307  NA 140  K 4.0  CL 101  CO2 29  GLUCOSE 148*  BUN 7*  CREATININE 1.02  CALCIUM 8.9    Studies/Results: DG THORACOLUMABAR SPINE  Result Date: 04/11/2019 CLINICAL DATA:  Revision of thoracolumbar fusion. EXAM: THORACOLUMBAR SPINE 1V COMPARISON:  Intraoperative radiographs 02/09/2019 FINDINGS: The superior aspect of the construct is imaged. Hardware is intact. Single view is submitted. IMPRESSION: Single image of hardware revision without evidence for complication. Electronically Signed   By: San Morelle M.D.   On: 04/11/2019 16:51   DG C-Arm 1-60 Min  Result Date: 04/11/2019 CLINICAL DATA:  Revision of thoracolumbar fusion. EXAM: THORACOLUMBAR SPINE 1V COMPARISON:  Intraoperative radiographs 02/09/2019 FINDINGS: The superior aspect of the construct is imaged. Hardware is intact. Single view is submitted. IMPRESSION: Single image of hardware revision without evidence for complication. Electronically Signed    By: San Morelle M.D.   On: 04/11/2019 16:51    Assessment/Plan: Postop day #1: He is doing well neurologically.  His drain put out 560 cc, so we will continue it until tomorrow.  His hemoglobin is okay.  We will mobilize him with PT.  LOS: 1 day     Thomas Mcclure 04/12/2019, 7:43 AM

## 2019-04-12 NOTE — Evaluation (Signed)
Physical Therapy Evaluation Patient Details Name: Thomas Mcclure MRN: [id number] DOB: 1955-04-08 Today's Date: 04/12/2019   History of Present Illness  64 yo admitted for revision of T10-L4 PLIF. PMHx: multiple back surgeries and spinal cord stimulator, RA, MI, HTN, HLD, CAD, obesity, DM, [place], ICM  Clinical Impression  Pt initially very adamant against mobility stating pain but after medication agreeable and very pleasant. Pt able to verbalize precautions from prior surgeries and needs cues to maintain during mobility. Pt required min assist to don brace appropriately and frequent cues for posture with mobility. Pt with decreased strength, transfers, gait and function who will benefit from acute therapy to maximize mobility, precautions and independence.      Follow Up Recommendations Outpatient PT    Equipment Recommendations  Rolling walker with 5" wheels;3in1 (PT)    Recommendations for Other Services OT consult     Precautions / Restrictions Precautions Precautions: Back;Fall Precaution Booklet Issued: Yes (comment) Precaution Comments: hemovac Required Braces or Orthoses: Spinal Brace Spinal Brace: Thoracolumbosacral orthotic;Applied in sitting position Restrictions Weight Bearing Restrictions: No      Mobility  Bed Mobility Overal bed mobility: Needs Assistance Bed Mobility: Rolling;Sidelying to Sit Rolling: Min guard Sidelying to sit: Min guard       General bed mobility comments: cues for sequence with HOB 30 degrees, pt has adjustable bed at home, reliance on rail and increased time  Transfers Overall transfer level: Needs assistance   Transfers: Sit to/from Stand Sit to Stand: Min guard         General transfer comment: cues for hand placement and posture  Ambulation/Gait Ambulation/Gait assistance: Min guard Gait Distance (Feet): 300 Feet Assistive device: Rolling walker (2 wheeled) Gait Pattern/deviations: Step-through pattern;Decreased stride  length   Gait velocity interpretation: >2.62 ft/sec, indicative of community ambulatory General Gait Details: cues for posture and proximity to [employer] Mobility    Modified Rankin (Stroke Patients Only)       Balance Overall balance assessment: Mild deficits observed, not formally tested                                           Pertinent Vitals/Pain Pain Assessment: 0-10 Pain Score: 7  Pain Location: back Pain Descriptors / Indicators: Aching;Guarding Pain Intervention(s): Limited activity within patient's tolerance;Monitored during session;Premedicated before session;Repositioned    Home Living Family/patient expects to be discharged to:: Private residence Living Arrangements: Spouse/significant other Available Help at Discharge: Family;Available 24 hours/day Type of Home: Apartment Home Access: Stairs to enter Entrance Stairs-Rails: Right;Left;Can reach both Entrance Stairs-Number of Steps: 2 Home Layout: One level Home Equipment: Cane - single point      Prior Function Level of Independence: Independent with assistive device(s)         Comments: uses cane at times with increased pain, was managing bathing and dressing on his own     Hand Dominance   Dominant Hand: Right    Extremity/Trunk Assessment   Upper Extremity Assessment Upper Extremity Assessment: Overall WFL for tasks assessed    Lower Extremity Assessment Lower Extremity Assessment: Overall WFL for tasks assessed    Cervical / Trunk Assessment Cervical / Trunk Assessment: Other exceptions Cervical / Trunk Exceptions: post spinal sx  Communication   Communication: No difficulties  Cognition Arousal/Alertness: Awake/alert Behavior During Therapy: WFL for tasks  assessed/performed Overall Cognitive Status: Within Functional Limits for tasks assessed                                        General Comments      Exercises      Assessment/Plan    PT Assessment Patient needs continued PT services  PT Problem List Decreased mobility;Decreased activity tolerance;Pain;Decreased knowledge of use of DME;Decreased knowledge of precautions       PT Treatment Interventions Stair training;Functional mobility training;Therapeutic activities;Patient/family education;Gait training;DME instruction;Therapeutic exercise    PT Goals (Current goals can be found in the Care Plan section)  Acute Rehab PT Goals Patient Stated Goal: be able to walk and go home PT Goal Formulation: With patient Time For Goal Achievement: 04/26/19 Potential to Achieve Goals: Good    Frequency Min 5X/week   Barriers to discharge        Co-evaluation               AM-PAC PT "6 Clicks" Mobility  Outcome Measure Help needed turning from your back to your side while in a flat bed without using bedrails?: A Little Help needed moving from lying on your back to sitting on the side of a flat bed without using bedrails?: A Little Help needed moving to and from a bed to a chair (including a wheelchair)?: A Little Help needed standing up from a chair using your arms (e.g., wheelchair or bedside chair)?: A Little Help needed to walk in hospital room?: A Little Help needed climbing 3-5 steps with a railing? : A Little 6 Click Score: 18    End of Session Equipment Utilized During Treatment: Back brace Activity Tolerance: Patient tolerated treatment well Patient left: in chair;with call bell/phone within reach Nurse Communication: Mobility status;Precautions PT Visit Diagnosis: Other abnormalities of gait and mobility (R26.89)    Time: [id number] PT Time Calculation (min) (ACUTE ONLY): 38 min   Charges:   PT Evaluation $PT Eval Moderate Complexity: 1 Mod PT Treatments $Gait Training: 8-22 mins $Therapeutic Activity: 8-22 mins        Thomas Mcclure, PT Acute Rehabilitation Services Pager: [phone] Office: [place]  Tiffanny Lamarche 04/12/2019, 12:35 PM

## 2019-04-12 NOTE — Plan of Care (Signed)

## 2019-04-12 NOTE — Progress Notes (Signed)
Occupational Therapy Evaluation Patient Details Name: Thomas Mcclure MRN: [id number] DOB: 10-19-1955 Today's Date: 04/12/2019    History of Present Illness 64 yo admitted for revision of T10-L4 PLIF. PMHx: multiple back surgeries and spinal cord stimulator, RA, MI, HTN, HLD, CAD, obesity, DM, [place], ICM   Clinical Impression   PTA, pt was living at home with his wife, and reports he was independent with ADL/IADL and functional mobility with use of spc. Pt currently requires minguard for functional mobility at RW level. He requires minguard to don his back brace while sitting and setupA for grooming while seated EOB. Pt will benefit from acute OT services to maximize independence with ADL/IADL and functional mobility. Feel pt will benefit from followup outpt PT services and assistance from his wife, without need for followup OT. Will continue to follow acutely to address AE to assist with LB dressing, tub transfers, and continue to progress mobility and independence with ADL.     Follow Up Recommendations  No OT follow up;Supervision - Intermittent(with mobility)    Equipment Recommendations  None recommended by OT    Recommendations for Other Services       Precautions / Restrictions Precautions Precautions: Back;Fall Precaution Booklet Issued: Yes (comment) Precaution Comments: hemovac Required Braces or Orthoses: Spinal Brace Spinal Brace: Thoracolumbosacral orthotic;Applied in sitting position Restrictions Weight Bearing Restrictions: No      Mobility Bed Mobility Overal bed mobility: Needs Assistance Bed Mobility: Rolling;Sidelying to Sit Rolling: Supervision Sidelying to sit: Supervision       General bed mobility comments: supervision for safety;reliance on rail and increased time and effort  Transfers Overall transfer level: Needs assistance Equipment used: Rolling walker (2 wheeled) Transfers: Sit to/from Stand Sit to Stand: Min guard         General  transfer comment: minguard for safety    Balance Overall balance assessment: Mild deficits observed, not formally tested                                         ADL either performed or assessed with clinical judgement   ADL Overall ADL's : Needs assistance/impaired Eating/Feeding: Set up;Sitting   Grooming: Set up;Sitting Grooming Details (indicate cue type and reason): washed/dried face, applied deodorant while sitting EOB Upper Body Bathing: Min guard;Sitting   Lower Body Bathing: Minimal assistance Lower Body Bathing Details (indicate cue type and reason): to access feet Upper Body Dressing : Min guard;Sitting Upper Body Dressing Details (indicate cue type and reason): donned back brace sitting EOB Lower Body Dressing: Minimal assistance Lower Body Dressing Details (indicate cue type and reason): to access feet Toilet Transfer: Min guard;Ambulation;RW   Toileting- Water quality scientist and Hygiene: Min guard;Sit to/from stand       Functional mobility during ADLs: Min guard;Rolling walker General ADL Comments: minguard for safety;pt demonstrates good adherence to back precautions;educated on importance of activity progression and limiting sitting for 41min at a time     Vision Patient Visual Report: No change from baseline Vision Assessment?: No apparent visual deficits     Perception     Praxis      Pertinent Vitals/Pain Pain Assessment: 0-10 Pain Score: 7  Pain Location: back Pain Descriptors / Indicators: Aching;Guarding Pain Intervention(s): Limited activity within patient's tolerance;Monitored during session;Repositioned     Hand Dominance Right   Extremity/Trunk Assessment Upper Extremity Assessment Upper Extremity Assessment: Overall WFL for tasks assessed  Lower Extremity Assessment Lower Extremity Assessment: Overall WFL for tasks assessed   Cervical / Trunk Assessment Cervical / Trunk Assessment: Other exceptions Cervical /  Trunk Exceptions: post spinal sx   Communication Communication Communication: No difficulties   Cognition Arousal/Alertness: Awake/alert Behavior During Therapy: WFL for tasks assessed/performed Overall Cognitive Status: Within Functional Limits for tasks assessed                                 General Comments: demonstrated good adherence to back precautions   General Comments  noted blood on chuck pad and blood on lower part of bandaging;RN notified    Exercises     Shoulder Instructions      Home Living Family/patient expects to be discharged to:: Private residence Living Arrangements: Spouse/significant other Available Help at Discharge: Family;Available 24 hours/day Type of Home: Apartment Home Access: Stairs to enter [employer] of Steps: 2 Entrance Stairs-Rails: Right;Left;Can reach both Home Layout: One level     Bathroom Shower/Tub: Teacher, early years/pre: Standard     Home Equipment: Cane - single point   Additional Comments: pt is a Publishing copy and loves gospel music      Prior Functioning/Environment Level of Independence: Independent with assistive device(s)        Comments: uses cane at times with increased pain, was managing bathing and dressing on his own        OT Problem List: Decreased strength;Decreased activity tolerance;Impaired balance (sitting and/or standing);Decreased safety awareness;Decreased knowledge of use of DME or AE;Pain      OT Treatment/Interventions: Self-care/ADL training;Therapeutic exercise;DME and/or AE instruction;Therapeutic activities;Patient/family education;Balance training    OT Goals(Current goals can be found in the care plan section) Acute Rehab OT Goals Patient Stated Goal: be able to walk and go home OT Goal Formulation: With patient Time For Goal Achievement: 04/26/19 Potential to Achieve Goals: Good ADL Goals Pt Will Perform Grooming: with supervision;standing Pt Will  Perform Upper Body Dressing: with supervision;sitting Pt Will Perform Lower Body Dressing: with supervision;sit to/from stand Pt Will Transfer to Toilet: with supervision;ambulating Pt Will Perform Tub/Shower Transfer: Tub transfer;with supervision  OT Frequency: Min 2X/week   Barriers to D/C: Decreased caregiver support  pt lives with wife       Co-evaluation              AM-PAC OT "6 Clicks" Daily Activity     Outcome Measure Help from another person eating meals?: None Help from another person taking care of personal grooming?: A Little Help from another person toileting, which includes using toliet, bedpan, or urinal?: A Little Help from another person bathing (including washing, rinsing, drying)?: A Little Help from another person to put on and taking off regular upper body clothing?: A Little Help from another person to put on and taking off regular lower body clothing?: A Little 6 Click Score: 19   End of Session Equipment Utilized During Treatment: Gait belt;Rolling walker Nurse Communication: Mobility status;Other (comment)(blood around surgical site)  Activity Tolerance: Patient tolerated treatment well Patient left: in chair;with call bell/phone within reach;with chair alarm set  OT Visit Diagnosis: Unsteadiness on feet (R26.81);Other abnormalities of gait and mobility (R26.89);Pain Pain - part of body: (back incision site)                Time: [id number] OT Time Calculation (min): 34 min Charges:  OT General Charges $OT Visit: 1 Visit OT Evaluation $  OT Eval Moderate Complexity: 1 Mod OT Treatments $Self Care/Home Management : 8-22 mins  Dorinda Hill OTR/L Acute Rehabilitation Services Office: [phone]   Wyn Forster 04/12/2019, 2:54 PM

## 2019-04-12 NOTE — Progress Notes (Signed)
MEWS 2  Temp: 99.0 BP: 112/80 Pulse rate: 113 Respitations: 18   Pain level 8/10 1 mg of Dilaudid administered at 2004  Not an acute change. MD notified of MEWs change. Will continue to monitor.

## 2019-04-12 NOTE — TOC Initial Note (Addendum)
Transition of Care [facility]) - Initial/Assessment Note    Patient Details  Name: Thomas Mcclure MRN: [id number] Date of Birth: [birth year]  [facility] of [facility]) CM/SW Contact:    Marilu Favre, RN Phone Number: 04/12/2019, 1:06 PM  Clinical Narrative:                 Patient from home. PT recommending OP PT. Discussed with patient, patient has been to [facility] on [address] before. Called DR [name] office spoke to Junie Panning was transferred to [employer]. Awaiting call back to see if NCM should order OP PT. Becky returned call , and DR [name] wants referral to neuro rehab order placed. Called Zack with [facility] for walker and 3 in 1  Expected Discharge Plan: Home/Self Care     Patient Goals and CMS Choice Patient states their goals for this hospitalization and ongoing recovery are:: to return to home CMS Medicare.gov Compare Post Acute Care list provided to:: Patient Choice offered to / list presented to : Patient  Expected Discharge Plan and Services Expected Discharge Plan: Home/Self Care   Discharge Planning Services: CM Consult Post Acute Care Choice: Durable Medical Equipment Living arrangements for the past 2 months: Single Family Home                 DME Arranged: 3-N-1, Walker rolling DME Agency: [employer] Date DME Agency Contacted: 04/12/19 Time DME Agency Contacted: [phone] Representative spoke with at DME Agency: [place]: NA          Prior Living Arrangements/Services Living arrangements for the past 2 months: [place] Lives with:: Spouse Patient language and need for interpreter reviewed:: Yes Do you feel safe going back to the place where you live?: Yes      Need for Family Participation in Patient Care: Yes (Comment) Care giver support system in place?: Yes (comment)   Criminal Activity/Legal Involvement Pertinent to Current Situation/Hospitalization: No - Comment as needed  Activities of Daily Living Home Assistive  Devices/Equipment: Walker (specify type), Cane (specify quad or straight) ADL Screening (condition at time of admission) Patient's cognitive ability adequate to safely complete daily activities?: Yes Is the patient deaf or have difficulty hearing?: No Does the patient have difficulty seeing, even when wearing glasses/contacts?: No Does the patient have difficulty concentrating, remembering, or making decisions?: No Patient able to express need for assistance with ADLs?: Yes Does the patient have difficulty dressing or bathing?: No Independently performs ADLs?: Yes (appropriate for developmental age) Does the patient have difficulty walking or climbing stairs?: Yes Weakness of Legs: Both Weakness of Arms/Hands: None  Permission Sought/Granted   Permission granted to share information with : No              Emotional Assessment Appearance:: Appears stated age Attitude/Demeanor/Rapport: Engaged Affect (typically observed): Accepting Orientation: : Oriented to Self, Oriented to Place, Oriented to  Time, Oriented to Situation Alcohol / Substance Use: Not Applicable    Admission diagnosis:  Lumbar pseudoarthrosis [S32.009K] Patient Active Problem List   Diagnosis Date Noted  . Chronic low back pain 02/09/2019  . Spinal stenosis of lumbar region with neurogenic claudication 11/22/2018  . Radiculopathy of lumbar region 05/31/2017  . HCAP (healthcare-associated pneumonia) 05/30/2017  . Leukocytosis 05/30/2017  . Lumbar pseudoarthrosis 05/27/2017  . Unstable angina ([place]) 02/18/2017  . Primary localized osteoarthritis of right hip 10/03/2016  . Chest pain, rule out acute myocardial infarction 05/01/2016  . Diabetes mellitus type 2, controlled, with complications (  [place]) 05/01/2016  . Obesity (BMI 30-39.9) 05/01/2016  . Left ventricular aneurysm 12/03/2015  . Ischemic cardiomyopathy   . CAD (coronary artery disease) 07/16/2015  . Hypokalemia 07/16/2015  . History of non-ST elevation  myocardial infarction (NSTEMI) 07/15/2015  . Hypertension 07/15/2015  . Hyperlipidemia with target LDL less than 100 07/15/2015  . Back pain 07/15/2015  . GERD (gastroesophageal reflux disease) 06/18/2012  . Chronic radicular low back pain 06/18/2012   PCP:  Vassie Moment, MD Pharmacy:   [facility] [place], [address] [place] [address] Phone: [phone] Fax: [phone]     Social Determinants of Health (SDOH) Interventions    Readmission Risk Interventions No flowsheet data found.

## 2019-04-13 LAB — GLUCOSE, CAPILLARY
Glucose-Capillary: 114 mg/dL — ABNORMAL HIGH (ref 70–99)
Glucose-Capillary: 120 mg/dL — ABNORMAL HIGH (ref 70–99)
Glucose-Capillary: 126 mg/dL — ABNORMAL HIGH (ref 70–99)
Glucose-Capillary: 131 mg/dL — ABNORMAL HIGH (ref 70–99)
Glucose-Capillary: 132 mg/dL — ABNORMAL HIGH (ref 70–99)
Glucose-Capillary: 148 mg/dL — ABNORMAL HIGH (ref 70–99)

## 2019-04-13 NOTE — Progress Notes (Signed)
OT Treatment Progress Note:  Clinical Impression: Pt Progressing towards OT goals this session. Focus was AE for LB ADL, and tub transfer. Pt was OOB in recliner, min guard for transfers, able to ambulate to ortho gym, tub transfer at min guard assist (Pt has grab bars at home) and reviewed AE kit with return demo at mod A (verbal cues only). Current OT plan remains appropriate at this time.     04/13/19 0900  OT Visit Information  Last OT Received On 04/13/19  Assistance Needed +1  History of Present Illness 64 yo admitted for revision of T10-L4 PLIF. PMHx: multiple back surgeries and spinal cord stimulator, RA, MI, HTN, HLD, CAD, obesity, DM, [place], ICM  Precautions  Precautions Back;Fall  Precaution Booklet Issued Yes (comment)  Precaution Comments hemovac  Required Braces or Orthoses Spinal Brace  Spinal Brace TLSO;Applied in sitting position  Pain Assessment  Pain Location back  Pain Descriptors / Indicators Aching;Guarding  Cognition  Arousal/Alertness Awake/alert  Behavior During Therapy WFL for tasks assessed/performed  Overall Cognitive Status Within Functional Limits for tasks assessed  General Comments demonstrated good adherence to back precautions  Upper Extremity Assessment  Upper Extremity Assessment Overall WFL for tasks assessed  Lower Extremity Assessment  Lower Extremity Assessment Overall WFL for tasks assessed  ADL  Overall ADL's  Needs assistance/impaired  Lower Body Bathing Set up;With adaptive equipment  Lower Body Bathing Details (indicate cue type and reason) educated on AE with long handle shower sponge  Upper Body Dressing  Min guard;Sitting  Upper Body Dressing Details (indicate cue type and reason) don/doff brace with return demo  Lower Body Dressing Minimal assistance;Adhering to back precautions;With adaptive equipment  Lower Body Dressing Details (indicate cue type and reason) educated on AE Occupational psychologist Min guard;Ambulation;RW  Toileting-  Forensic psychologist Min guard;Sit to/from stand  Functional mobility during ADLs Min guard;Rolling walker  General ADL Comments minguard for safety;pt demonstrates good adherence to back precautions;educated on importance of activity progression and limiting sitting for 48mn at a time  Bed Mobility  Overal bed mobility Needs Assistance  Bed Mobility Rolling;Sidelying to Sit  Rolling Supervision  Sidelying to sit Supervision  General bed mobility comments supervision for safety;reliance on rail and increased time and effort  Balance  Overall balance assessment Mild deficits observed, not formally tested  Restrictions  Weight Bearing Restrictions No  Vision- Assessment  Vision Assessment? No apparent visual deficits  Transfers  Overall transfer level Needs assistance  Equipment used Rolling walker (2 wheeled)  Transfers Sit to/from Stand  Sit to Stand Min guard  General transfer comment minguard for safety  OT - End of Session  Equipment Utilized During Treatment Gait belt;Rolling walker  Activity Tolerance Patient tolerated treatment well  Patient left in chair;with call bell/phone within reach;with chair alarm set  Nurse Communication Mobility status;Other (comment) (blood around surgical site)  OT Assessment/Plan  OT Visit Diagnosis Unsteadiness on feet (R26.81);Other abnormalities of gait and mobility (R26.89);Pain  Pain - part of body  (back incision site)  OT Frequency (ACUTE ONLY) Min 2X/week  Follow Up Recommendations No OT follow up;Supervision - Intermittent (with mobility)  OT Equipment None recommended by OT  AM-PAC OT "6 Clicks" Daily Activity Outcome Measure (Version 2)  Help from another person eating meals? 4  Help from another person taking care of personal grooming? 3  Help from another person toileting, which includes using toliet, bedpan, or urinal? 3  Help from another person bathing (including washing,  rinsing, drying)? 3  Help from another  person to put on and taking off regular upper body clothing? 3  Help from another person to put on and taking off regular lower body clothing? 3  6 Click Score 19  OT Goal Progression  Progress towards OT goals Progressing toward goals  Acute Rehab OT Goals  Patient Stated Goal be able to walk and go home  OT Goal Formulation With patient  Time For Goal Achievement 04/26/19  Potential to Achieve Goals Good  OT Time Calculation  OT Start Time (ACUTE ONLY) 0911  OT Stop Time (ACUTE ONLY) 0940  OT Time Calculation (min) 29 min  OT General Charges  $OT Visit 1 Visit  OT Treatments  $Self Care/Home Management  23-37 mins   Jesse Sans OTR/L Acute Rehabilitation Services Pager: [phone] Office: [phone]

## 2019-04-13 NOTE — Progress Notes (Addendum)
   Providing Compassionate, Quality Care - Together   Subjective: Patient reports pain is slowly improving. He just finished working with therapies who report he is doing very well functionally. Patient doesn't feel his pain is at a level where he's comfortable being discharged home. Patient reports he was able to get his spinal cord stimulator functioning again yesterday.  Objective: Vital signs in last 24 hours: Temp:  [97.9 F (36.6 C)-100.3 F (37.9 C)] 98.3 F (36.8 C) (02/17 0839) Pulse Rate:  [98-120] 98 (02/17 0839) Resp:  [16-18] 16 (02/17 0839) BP: (97-119)/(63-80) 97/77 (02/17 0839) SpO2:  [90 %-95 %] 95 % (02/17 0839)  Intake/Output from previous day: 02/16 0701 - 02/17 0700 In: 798.6 [P.O.:480; I.V.:218.6] Out: 2400 [Urine:2300; Drains:100] Intake/Output this shift: No intake/output data recorded.  Alert and oriented x 4 PERRLA CN II-XII grossly intact MAE, Strength and sensation intact Incision is covered with Honeycomb dressing and Steri Strips; Dressing is dry and intact with old sanguinous drainage. No signs of active bleeding. Hemovac with 100 mL out overnight   Lab Results: Recent Labs    04/12/19 0307  WBC 10.1  HGB 9.9*  HCT 31.8*  PLT 288   BMET Recent Labs    04/12/19 0307  NA 140  K 4.0  CL 101  CO2 29  GLUCOSE 148*  BUN 7*  CREATININE 1.02  CALCIUM 8.9    Studies/Results: DG THORACOLUMABAR SPINE  Result Date: 04/11/2019 CLINICAL DATA:  Revision of thoracolumbar fusion. EXAM: THORACOLUMBAR SPINE 1V COMPARISON:  Intraoperative radiographs 02/09/2019 FINDINGS: The superior aspect of the construct is imaged. Hardware is intact. Single view is submitted. IMPRESSION: Single image of hardware revision without evidence for complication. Electronically Signed   By: San Morelle M.D.   On: 04/11/2019 16:51   DG C-Arm 1-60 Min  Result Date: 04/11/2019 CLINICAL DATA:  Revision of thoracolumbar fusion. EXAM: THORACOLUMBAR SPINE 1V  COMPARISON:  Intraoperative radiographs 02/09/2019 FINDINGS: The superior aspect of the construct is imaged. Hardware is intact. Single view is submitted. IMPRESSION: Single image of hardware revision without evidence for complication. Electronically Signed   By: San Morelle M.D.   On: 04/11/2019 16:51    Assessment/Plan: Patient is two days status post exploration and revision of his T10-11, T11-12, T12-L1, L1-2 fusion. He is slowly improving.   LOS: 2 days    -Continue to mobilize with therapies -Remove Hemovac -Possible discharge home tomorrow, pending pain control   Viona Gilmore, DNP, AGNP-C Nurse Practitioner  [facility] [place]. [address], [address], [place], [place] [address] P: [phone]    F: [phone]  04/13/2019, 10:35 AM

## 2019-04-13 NOTE — Progress Notes (Signed)
Physical Therapy Treatment Patient Details Name: Thomas Mcclure MRN: [id number] DOB: 02-Jul-1955 Today's Date: 04/13/2019    History of Present Illness 64 yo admitted for revision of T10-L4 PLIF. PMHx: multiple back surgeries and spinal cord stimulator, RA, MI, HTN, HLD, CAD, obesity, DM, [place], ICM    PT Comments    Continuing work on functional mobility and activity tolerance;  Session focused on progressive ambulation and stair training, both of which he managed quite well; Ended session in bed, quite painful after having PT immediately after OT; On track for dc home tomorrow from a functional mobility standpoint   Follow Up Recommendations  Outpatient PT     Equipment Recommendations  Rolling walker with 5" wheels;3in1 (PT)    Recommendations for Other Services       Precautions / Restrictions Precautions Precautions: Back;Fall Precaution Booklet Issued: Yes (comment) Precaution Comments: hemovac Required Braces or Orthoses: Spinal Brace Spinal Brace: Thoracolumbosacral orthotic;Applied in sitting position Restrictions Weight Bearing Restrictions: No    Mobility  Bed Mobility Overal bed mobility: Needs Assistance Bed Mobility: Sit to Sidelying;Rolling Rolling: Supervision       Sit to sidelying: Min guard General bed mobility comments: supervision for safety;reliance on rail and increased time and effort  Transfers Overall transfer level: Needs assistance Equipment used: Rolling walker (2 wheeled) Transfers: Sit to/from Stand Sit to Stand: Min guard         General transfer comment: minguard for safety  Ambulation/Gait Ambulation/Gait assistance: Min guard Gait Distance (Feet): 150 Feet Assistive device: Rolling walker (2 wheeled) Gait Pattern/deviations: Step-through pattern;Decreased stride length     General Gait Details: cues for posture and proximity to [employer] Stairs: Yes Stairs assistance: Supervision Stair Management: Two  rails;Alternating pattern;Forwards Number of Stairs: 5 General stair comments: No difficulty   Wheelchair Mobility    Modified Rankin (Stroke Patients Only)       Balance Overall balance assessment: Mild deficits observed, not formally tested                                          Cognition Arousal/Alertness: Awake/alert Behavior During Therapy: WFL for tasks assessed/performed Overall Cognitive Status: Within Functional Limits for tasks assessed                                 General Comments: demonstrated good adherence to back precautions      Exercises      General Comments        Pertinent Vitals/Pain Pain Assessment: 0-10 Pain Score: 7  Pain Location: back Pain Descriptors / Indicators: Aching;Guarding Pain Intervention(s): Patient requesting pain meds-RN notified;Repositioned    Home Living                      Prior Function            PT Goals (current goals can now be found in the care plan section) Acute Rehab PT Goals Patient Stated Goal: be able to walk and go home PT Goal Formulation: With patient Time For Goal Achievement: 04/26/19 Potential to Achieve Goals: Good Progress towards PT goals: Progressing toward goals    Frequency    Min 5X/week      PT Plan Current plan remains appropriate    Co-evaluation  AM-PAC PT "6 Clicks" Mobility   Outcome Measure  Help needed turning from your back to your side while in a flat bed without using bedrails?: A Little Help needed moving from lying on your back to sitting on the side of a flat bed without using bedrails?: None Help needed moving to and from a bed to a chair (including a wheelchair)?: A Little Help needed standing up from a chair using your arms (e.g., wheelchair or bedside chair)?: A Little Help needed to walk in hospital room?: None Help needed climbing 3-5 steps with a railing? : None 6 Click Score: 21    End of  Session Equipment Utilized During Treatment: Back brace Activity Tolerance: Patient tolerated treatment well Patient left: in bed;with call bell/phone within reach Nurse Communication: Mobility status;Precautions PT Visit Diagnosis: Other abnormalities of gait and mobility (R26.89)     Time: [id number] PT Time Calculation (min) (ACUTE ONLY): 19 min  Charges:  $Gait Training: 8-22 mins                     Roney Marion, PT  [place] Pager [phone] Office [place] 04/13/2019, 2:49 PM

## 2019-04-14 LAB — GLUCOSE, CAPILLARY
Glucose-Capillary: 105 mg/dL — ABNORMAL HIGH (ref 70–99)
Glucose-Capillary: 119 mg/dL — ABNORMAL HIGH (ref 70–99)
Glucose-Capillary: 124 mg/dL — ABNORMAL HIGH (ref 70–99)
Glucose-Capillary: 125 mg/dL — ABNORMAL HIGH (ref 70–99)
Glucose-Capillary: 146 mg/dL — ABNORMAL HIGH (ref 70–99)
Glucose-Capillary: 151 mg/dL — ABNORMAL HIGH (ref 70–99)

## 2019-04-14 MED ORDER — CEFAZOLIN SODIUM-DEXTROSE 2-4 GM/100ML-% IV SOLN
2.0000 g | Freq: Three times a day (TID) | INTRAVENOUS | Status: DC
  Administered 2019-04-14 – 2019-04-15 (×4): 2 g via INTRAVENOUS
  Filled 2019-04-14 (×4): qty 100

## 2019-04-14 MED ORDER — POLYVINYL ALCOHOL 1.4 % OP SOLN
1.0000 [drp] | OPHTHALMIC | Status: DC | PRN
  Administered 2019-04-14: 1 [drp] via OPHTHALMIC
  Filled 2019-04-14: qty 15

## 2019-04-14 NOTE — Progress Notes (Signed)
   Providing Compassionate, Quality Care - Together   Subjective: Patient reports expected post operative pain overnight. He tells me he's going to try to only use oral pain medication today and aim for discharging home tomorrow.  Objective: Vital signs in last 24 hours: Temp:  [97.3 F (36.3 C)-100.1 F (37.8 C)] 100.1 F (37.8 C) (02/18 0807) Pulse Rate:  [100-118] 100 (02/18 0807) Resp:  [16-18] 18 (02/18 0807) BP: (86-114)/(63-75) 109/69 (02/18 0807) SpO2:  [90 %-97 %] 97 % (02/18 0807) Weight:  [120.5 kg] 120.5 kg (02/18 0744)  Intake/Output from previous day: 02/17 0701 - 02/18 0700 In: 480 [P.O.:480] Out: 200 [Drains:200] Intake/Output this shift: Total I/O In: 240 [P.O.:240] Out: 100 [Urine:100]  Alert and oriented x 4 PERRLA CN II-XII grossly intact MAE, Strength and sensation intact Incision is covered with Honeycomb dressing and Steri Strips; Dressing was changed overnight and there is a moderate amount of sanguinous drainage.  Lab Results: Recent Labs    04/12/19 0307  WBC 10.1  HGB 9.9*  HCT 31.8*  PLT 288   BMET Recent Labs    04/12/19 0307  NA 140  K 4.0  CL 101  CO2 29  GLUCOSE 148*  BUN 7*  CREATININE 1.02  CALCIUM 8.9    Studies/Results: No results found.  Assessment/Plan: Patient is three days status post exploration and revision of his T10-11, T11-12, T12-L1, L1-2 fusion. He is slowly improving.   LOS: 3 days    -Continue to mobilize -Plan is to discharge on 04/15/2019   Viona Gilmore, [place], AGNP-C Nurse Practitioner  [facility] [place]. [address], [address], [place], [place] [address] P: [phone]    F: [phone]  04/14/2019, 8:45 AM

## 2019-04-14 NOTE — Progress Notes (Signed)
MEWS YELLOW Elevated pulse.  Notified Ashok Pall with [facility]. Will continue to monitor. Pain meds administered.

## 2019-04-14 NOTE — Progress Notes (Addendum)
Held @2200  Lisinopril d/t low BP, MD orders.  MEWS score 2 @0000  Notified Mauri Pole with [facility] MD ordered notify if SBP <85 Will continue to monitor

## 2019-04-14 NOTE — Progress Notes (Signed)
Pt called out multiple times throughout the night for pain medication. Pt educated on only using dilaudid as break through pain, pt stated he understood and would try to back off of it today and just use the oxy and flexeril.

## 2019-04-14 NOTE — Plan of Care (Signed)

## 2019-04-14 NOTE — Progress Notes (Signed)
Physical Therapy Treatment Patient Details Name: Thomas Mcclure MRN: [id number] DOB: 23-Jul-1955 Today's Date: 04/14/2019    History of Present Illness Pt is a 64 y/o male admitted for revision of T10-L4 PLIF. PMHx: multiple back surgeries and spinal cord stimulator, RA, MI, HTN, HLD, CAD, obesity, DM, [place], ICM    PT Comments    Pt continuing to make steady progress with functional mobility. He was able to increase distance ambulated this session. Plan is for pt to d/c home tomorrow per pt. Pt would continue to benefit from skilled physical therapy services at this time while admitted and after d/c to address the below listed limitations in order to improve overall safety and independence with functional mobility.    Follow Up Recommendations  Outpatient PT     Equipment Recommendations  Rolling walker with 5" wheels;3in1 (PT)    Recommendations for Other Services       Precautions / Restrictions Precautions Precautions: Back;Fall Precaution Comments: cueing needed to maintain back precautions throughout with functional mobility  Required Braces or Orthoses: Spinal Brace Spinal Brace: Thoracolumbosacral orthotic;Applied in sitting position Restrictions Weight Bearing Restrictions: No    Mobility  Bed Mobility Overal bed mobility: Needs Assistance Bed Mobility: Rolling;Sidelying to Sit Rolling: Supervision Sidelying to sit: Supervision       General bed mobility comments: supervision for safety, use of rails  Transfers Overall transfer level: Needs assistance Equipment used: Rolling walker (2 wheeled) Transfers: Sit to/from Stand Sit to Stand: From elevated surface;Min guard         General transfer comment: use of momentum, min guard for safety with transition  Ambulation/Gait Ambulation/Gait assistance: Min guard Gait Distance (Feet): 500 Feet Assistive device: Rolling walker (2 wheeled) Gait Pattern/deviations: Step-through pattern;Decreased stride  length Gait velocity: able to fluctuate   General Gait Details: frequent cueing for improved upright posture and to maintain proximity to [employer] Mobility    Modified Rankin (Stroke Patients Only)       Balance Overall balance assessment: Mild deficits observed, not formally tested                                          Cognition Arousal/Alertness: Awake/alert Behavior During Therapy: WFL for tasks assessed/performed Overall Cognitive Status: Within Functional Limits for tasks assessed                                        Exercises      General Comments        Pertinent Vitals/Pain Pain Assessment: 0-10 Pain Score: 8  Pain Location: back Pain Descriptors / Indicators: Aching;Guarding Pain Intervention(s): Monitored during session;Repositioned    Home Living                      Prior Function            PT Goals (current goals can now be found in the care plan section) Acute Rehab PT Goals PT Goal Formulation: With patient Time For Goal Achievement: 04/26/19 Potential to Achieve Goals: Good Progress towards PT goals: Progressing toward goals    Frequency    Min 5X/week      PT Plan Current plan remains appropriate  Co-evaluation              AM-PAC PT "6 Clicks" Mobility   Outcome Measure  Help needed turning from your back to your side while in a flat bed without using bedrails?: A Little Help needed moving from lying on your back to sitting on the side of a flat bed without using bedrails?: None Help needed moving to and from a bed to a chair (including a wheelchair)?: A Little Help needed standing up from a chair using your arms (e.g., wheelchair or bedside chair)?: A Little Help needed to walk in hospital room?: None Help needed climbing 3-5 steps with a railing? : None 6 Click Score: 21    End of Session Equipment Utilized During Treatment: Back  brace Activity Tolerance: Patient tolerated treatment well Patient left: in chair;with call bell/phone within reach;with nursing/sitter in room Nurse Communication: Mobility status;Precautions PT Visit Diagnosis: Other abnormalities of gait and mobility (R26.89)     Time: [id number] PT Time Calculation (min) (ACUTE ONLY): 23 min  Charges:  $Gait Training: 8-22 mins $Therapeutic Activity: 8-22 mins                     Anastasio Champion, DPT  Acute Rehabilitation Services Pager [phone] Office [place] 04/14/2019, 11:34 AM

## 2019-04-14 NOTE — Progress Notes (Signed)
OT Cancellation Note  Patient Details Name: Thomas Mcclure MRN: [id number] DOB: 09/02/1955   Cancelled Treatment:    Reason Eval/Treat Not Completed: Pain limiting ability to participate;Other (comment) Pt reporting pain and nausea declining OT session. Will check back as time allows for OT session.   Lanier Clam., COTA/L Acute Rehabilitation Services [phone] [place] 04/14/2019, 2:14 PM

## 2019-04-15 LAB — GLUCOSE, CAPILLARY
Glucose-Capillary: 113 mg/dL — ABNORMAL HIGH (ref 70–99)
Glucose-Capillary: 115 mg/dL — ABNORMAL HIGH (ref 70–99)
Glucose-Capillary: 121 mg/dL — ABNORMAL HIGH (ref 70–99)

## 2019-04-15 MED ORDER — CEPHALEXIN 500 MG PO CAPS: 1000.0000 mg | ORAL_CAPSULE | Freq: Four times a day (QID) | ORAL | 0 refills | Status: AC

## 2019-04-15 MED ORDER — OXYCODONE HCL 20 MG PO TABS: 20.0000 mg | ORAL_TABLET | ORAL | 0 refills | Status: DC | PRN

## 2019-04-15 NOTE — Progress Notes (Signed)
Physical Therapy Treatment Patient Details Name: Thomas Mcclure MRN: [id number] DOB: 1955/10/07 Today's Date: 04/15/2019    History of Present Illness Pt is a 64 y/o male admitted for revision of T10-L4 PLIF. PMHx: multiple back surgeries and spinal cord stimulator, RA, MI, HTN, HLD, CAD, obesity, DM, [place], ICM    PT Comments    Pt continuing to progress well with mobility. Plan is to d/c home today with family support. Pt would continue to benefit from skilled physical therapy services at this time while admitted and after d/c to address the below listed limitations in order to improve overall safety and independence with functional mobility.    Follow Up Recommendations  Outpatient PT     Equipment Recommendations  Rolling walker with 5" wheels;3in1 (PT)    Recommendations for Other Services       Precautions / Restrictions Precautions Precautions: Back;Fall Precaution Comments: cueing needed to maintain back precautions throughout with functional mobility  Required Braces or Orthoses: Spinal Brace Spinal Brace: Thoracolumbosacral orthotic;Applied in sitting position Restrictions Weight Bearing Restrictions: No    Mobility  Bed Mobility Overal bed mobility: Needs Assistance Bed Mobility: Rolling;Sidelying to Sit Rolling: Supervision Sidelying to sit: Supervision       General bed mobility comments: supervision for safety, use of rails  Transfers Overall transfer level: Needs assistance Equipment used: Rolling walker (2 wheeled) Transfers: Sit to/from Stand Sit to Stand: Supervision         General transfer comment: supervision for safety, good technique utilized  Ambulation/Gait Ambulation/Gait assistance: Min guard Gait Distance (Feet): 350 Feet Assistive device: Rolling walker (2 wheeled) Gait Pattern/deviations: Step-through pattern;Decreased stride length Gait velocity: able to fluctuate   General Gait Details: frequent cueing for improved upright  posture and to maintain proximity to RW; pt required one standing rest break secondary to fatigue   Stairs             Wheelchair Mobility    Modified Rankin (Stroke Patients Only)       Balance Overall balance assessment: Mild deficits observed, not formally tested                                          Cognition Arousal/Alertness: Awake/alert Behavior During Therapy: WFL for tasks assessed/performed Overall Cognitive Status: Within Functional Limits for tasks assessed                                        Exercises      General Comments        Pertinent Vitals/Pain Pain Assessment: Faces Faces Pain Scale: Hurts a little bit Pain Location: back Pain Descriptors / Indicators: Aching;Guarding Pain Intervention(s): Monitored during session;Repositioned    Home Living                      Prior Function            PT Goals (current goals can now be found in the care plan section) Acute Rehab PT Goals PT Goal Formulation: With patient Time For Goal Achievement: 04/26/19 Potential to Achieve Goals: Good Progress towards PT goals: Progressing toward goals    Frequency    Min 5X/week      PT Plan Current plan remains appropriate    Co-evaluation  AM-PAC PT "6 Clicks" Mobility   Outcome Measure  Help needed turning from your back to your side while in a flat bed without using bedrails?: A Little Help needed moving from lying on your back to sitting on the side of a flat bed without using bedrails?: None Help needed moving to and from a bed to a chair (including a wheelchair)?: A Little Help needed standing up from a chair using your arms (e.g., wheelchair or bedside chair)?: A Little Help needed to walk in hospital room?: None Help needed climbing 3-5 steps with a railing? : None 6 Click Score: 21    End of Session Equipment Utilized During Treatment: Back brace Activity Tolerance:  Patient tolerated treatment well Patient left: in chair;with call bell/phone within reach;with nursing/sitter in room Nurse Communication: Mobility status;Precautions PT Visit Diagnosis: Other abnormalities of gait and mobility (R26.89)     Time: [id number] PT Time Calculation (min) (ACUTE ONLY): 24 min  Charges:  $Gait Training: 8-22 mins $Therapeutic Activity: 8-22 mins                     Anastasio Champion, DPT  Acute Rehabilitation Services Pager [phone] Office [place] 04/15/2019, 12:25 PM

## 2019-04-15 NOTE — Discharge Summary (Signed)
Physician Discharge Summary  Patient ID: Thomas Mcclure MRN: [id number] DOB/AGE: 1955/05/26 64 y.o.  Admit date: 04/11/2019 Discharge date: 04/15/2019  Admission Diagnoses: Lumbar pseudoarthrosis, thoracolumbar instrumentation failure, lumbago  Discharge Diagnoses: The same Active Problems:   Lumbar pseudoarthrosis   Discharged Condition: good  Hospital Course: I performed a revision of the patient's thoracolumbar fusion on 04/11/2019.  The surgery went well.  The patient's postoperative course was unremarkable except for some serosanguineous wound drainage.  The patient was started on empiric Kefzol.  On 04/15/2019 the patient requested discharge to home.  He was given oral and written discharge instructions.  I instructed him on twice daily dressing changes.  I will plan to see him next Friday for a wound check.  Consults: PT, OT Significant Diagnostic Studies: None Treatments: Revision of thoracolumbar instrumentation and fusion Discharge Exam: Blood pressure 105/77, pulse (!) 104, temperature 98.5 F (36.9 C), temperature source Oral, resp. rate 17, height 5\' 10"  (1.778 m), weight 120.5 kg, SpO2 91 %. The patient is alert and pleasant.  He looks well.  His dressing has a small amount of serosanguineous discharge.  His lower extremity strength is normal.  Disposition: Home  Discharge Instructions    Ambulatory referral to Physical Therapy   Complete by: As directed    Iontophoresis - 4 mg/ml of dexamethasone: No   T.E.N.S. Unit Evaluation and Dispense as Indicated: No   Call MD for:  difficulty breathing, headache or visual disturbances   Complete by: As directed    Call MD for:  extreme fatigue   Complete by: As directed    Call MD for:  hives   Complete by: As directed    Call MD for:  persistant dizziness or light-headedness   Complete by: As directed    Call MD for:  persistant nausea and vomiting   Complete by: As directed    Call MD for:  redness, tenderness, or  signs of infection (pain, swelling, redness, odor or green/yellow discharge around incision site)   Complete by: As directed    Call MD for:  severe uncontrolled pain   Complete by: As directed    Call MD for:  temperature >100.4   Complete by: As directed    Diet - low sodium heart healthy   Complete by: As directed    Discharge instructions   Complete by: As directed    Call [phone] for a followup appointment. Take a stool softener while you are using pain medications.   Discharge wound care:   Complete by: As directed    Apply a sterile 4 x 4 and tape bandage to the wound and change it twice a day.   Driving Restrictions   Complete by: As directed    Do not drive for 2 weeks.   Increase activity slowly   Complete by: As directed    Lifting restrictions   Complete by: As directed    Do not lift more than 5 pounds. No excessive bending or twisting.   May shower / Bathe   Complete by: As directed    Remove the dressing for 3 days after surgery.  You may shower, but leave the incision alone.     Allergies as of 04/15/2019      Reactions   Brilinta [ticagrelor] Shortness Of Breath, Other (See Comments)   CHEST PAIN    Methylprednisolone Other (See Comments)   DIAPHORESIS, HYPOTENSION   Prednisone Other (See Comments)   DIAPHORESIS, HYPOTENSION   Toradol [ketorolac Tromethamine]  Other (See Comments)   DIAPHORESIS, HYPOTENSION   Adhesive [tape] Rash, Other (See Comments)   "blisters" ( NO SURGICAL TAPE, PLEASE)      Medication List    STOP taking these medications   celecoxib 400 MG capsule Commonly known as: CELEBREX   nitroGLYCERIN 0.4 MG SL tablet Commonly known as: NITROSTAT     TAKE these medications   acetaminophen 500 MG tablet Commonly known as: TYLENOL Take 1,000 mg by mouth 2 (two) times daily as needed (pain.).   aspirin EC 81 MG tablet Take 1 tablet (81 mg total) by mouth daily.   atorvastatin 80 MG tablet Commonly known as: LIPITOR Take 80 mg  by mouth daily at 6 PM.   carvedilol 12.5 MG tablet Commonly known as: COREG Take 1 tablet (12.5 mg total) by mouth 2 (two) times daily.   cephALEXin 500 MG capsule Commonly known as: KEFLEX Take 2 capsules (1,000 mg total) by mouth 4 (four) times daily for 7 days.   chlorthalidone 25 MG tablet Commonly known as: HYGROTON Take 1 tablet (25 mg total) by mouth daily.   cyclobenzaprine 10 MG tablet Commonly known as: FLEXERIL Take 1 tablet (10 mg total) by mouth 3 (three) times daily as needed for muscle spasms.   diclofenac sodium 1 % Gel Commonly known as: VOLTAREN Apply 1 application topically 4 (four) times daily as needed (pain.).   Dilt-XR 180 MG 24 hr capsule Generic drug: diltiazem Take 180 mg by mouth daily.   docusate sodium 100 MG capsule Commonly known as: COLACE Take 1 capsule (100 mg total) by mouth 2 (two) times daily.   DULoxetine 60 MG capsule Commonly known as: CYMBALTA Take 1 capsule (60 mg total) by mouth daily.   glipiZIDE 5 MG tablet Commonly known as: GLUCOTROL Take 5 mg by mouth daily before breakfast.   lisinopril 20 MG tablet Commonly known as: ZESTRIL Take 20 mg by mouth at bedtime.   lubiprostone 24 MCG capsule Commonly known as: AMITIZA Take 48 mcg by mouth at bedtime.   metFORMIN 500 MG 24 hr tablet Commonly known as: Glucophage XR Take 2 tablets (1,000 mg total) by mouth 2 (two) times daily.   multivitamin with minerals Tabs tablet Take 1 tablet by mouth daily.   omeprazole 40 MG capsule Commonly known as: PRILOSEC Take 1 capsule (40 mg total) by mouth daily.   Oxycodone HCl 20 MG Tabs Take 1 tablet (20 mg total) by mouth every 4 (four) hours as needed for moderate pain. What changed:   when to take this  reasons to take this   polyethylene glycol powder 17 GM/SCOOP powder Commonly known as: GLYCOLAX/MIRALAX Take 17 g by mouth daily. Mix in 8 oz water, juice, soda, coffee or tea and drink   potassium chloride SA 20 MEQ  tablet Commonly known as: KLOR-CON Take 1 tablet (20 mEq total) by mouth daily.   pregabalin 200 MG capsule Commonly known as: LYRICA Take 1 capsule (200 mg total) by mouth 2 (two) times daily.   QC LIDOCAINE PAIN RELIEF EX Place 1 patch onto the skin daily as needed (pain.).   senna-docusate 8.6-50 MG tablet Commonly known as: Senokot S Take 1 tablet by mouth 2 (two) times daily.            Durable Medical Equipment  (From admission, onward)         Start     Ordered   04/12/19 1309  For home use only DME 3 n 1  Once     04/12/19 1308   04/12/19 1308  For home use only DME Walker rolling  Once    Question Answer Comment  Walker: With [place]   Patient needs a walker to treat with the following condition Weakness      04/12/19 1308           Discharge Care Instructions  (From admission, onward)         Start     Ordered   04/15/19 0000  Discharge wound care:    Comments: Apply a sterile 4 x 4 and tape bandage to the wound and change it twice a day.   04/15/19 [place]. Schedule an appointment as soon as possible for a visit.   Specialty: Rehabilitation Contact information: [address] [place] [id number] [place] [phone]          Signed: [name] 04/15/2019, 7:46 AM

## 2019-04-15 NOTE — Progress Notes (Signed)
Orthopedic Tech Progress Note Patient Details:  Thomas Mcclure Apr 19, 1955 [id number] HANGER called checking to see if patient had brace. Someone routed over information saying patient needed brace but I let HANGER know patient brought brace/TLSO from home Patient ID: Thomas Mcclure, male   DOB: 04-07-1955, 64 y.o.   MRN: [id number]   Janit Pagan 04/15/2019, 9:59 AM

## 2019-04-15 NOTE — Plan of Care (Signed)

## 2019-04-15 NOTE — Progress Notes (Signed)
Discharge instruction packet provided to the patient.  Instructions were reviewed with the patient and the patient verbalizes understanding The patient will be discharge to the home with his wife and wearing his post op back brace.

## 2019-04-15 NOTE — Progress Notes (Signed)
Occupational Therapy Treatment Patient Details Name: Thomas Mcclure MRN: [id number] DOB: Jul 09, 1955 Today's Date: 04/15/2019    History of present illness Pt is a 64 y/o male admitted for revision of T10-L4 PLIF. PMHx: multiple back surgeries and spinal cord stimulator, RA, MI, HTN, HLD, CAD, obesity, DM, [place], ICM   OT comments  Pt progressing towards acute OT goals. Eager for d/c home today. Focus of session was UB/LB dressing utilizing AE. Extra time and setup needed but pt able to complete at supervision-min guard level.   Follow Up Recommendations  No OT follow up;Supervision - Intermittent    Equipment Recommendations  None recommended by OT    Recommendations for Other Services      Precautions / Restrictions Precautions Precautions: Back;Fall Precaution Booklet Issued: Yes (comment) Precaution Comments: cueing needed to maintain back precautions throughout with functional mobility  Required Braces or Orthoses: Spinal Brace Spinal Brace: Thoracolumbosacral orthotic;Applied in sitting position Restrictions Weight Bearing Restrictions: No       Mobility Bed Mobility Overal bed mobility: Needs Assistance Bed Mobility: Rolling;Sidelying to Sit Rolling: Supervision Sidelying to sit: Supervision       General bed mobility comments: supervision for safety, use of rails  Transfers Overall transfer level: Needs assistance Equipment used: Rolling walker (2 wheeled) Transfers: Sit to/from Stand Sit to Stand: Supervision         General transfer comment: supervision for safety, good technique utilized    Balance Overall balance assessment: Mild deficits observed, not formally tested                                         ADL either performed or assessed with clinical judgement   ADL Overall ADL's : Needs assistance/impaired                 Upper Body Dressing : Supervision/safety;Sitting   Lower Body Dressing: Min guard;With adaptive  equipment;Sit to/from stand                 General ADL Comments: bed mobility, UB/LB dressing using AE, transferred to recliner     Vision       Perception     Praxis      Cognition Arousal/Alertness: Awake/alert Behavior During Therapy: WFL for tasks assessed/performed Overall Cognitive Status: Within Functional Limits for tasks assessed                                          Exercises     Shoulder Instructions       General Comments      Pertinent Vitals/ Pain       Pain Assessment: 0-10 Pain Score: 8  Pain Location: back Pain Descriptors / Indicators: Aching;Guarding Pain Intervention(s): Monitored during session;Limited activity within patient's tolerance;Repositioned  Home Living                                          Prior Functioning/Environment              Frequency  Min 2X/week        Progress Toward Goals  OT Goals(current goals can now be found in the care plan section)  Progress towards OT  goals: Progressing toward goals  Acute Rehab OT Goals Patient Stated Goal: be able to walk and go home OT Goal Formulation: With patient Time For Goal Achievement: 04/26/19 Potential to Achieve Goals: Good ADL Goals Pt Will Perform Grooming: with supervision;standing Pt Will Perform Upper Body Dressing: with supervision;sitting Pt Will Perform Lower Body Dressing: with supervision;sit to/from stand Pt Will Transfer to Toilet: with supervision;ambulating Pt Will Perform Tub/Shower Transfer: Tub transfer;with supervision  Plan Discharge plan remains appropriate    Co-evaluation                 AM-PAC OT "6 Clicks" Daily Activity     Outcome Measure   Help from another person eating meals?: None Help from another person taking care of personal grooming?: None Help from another person toileting, which includes using toliet, bedpan, or urinal?: A Little Help from another person bathing  (including washing, rinsing, drying)?: A Little Help from another person to put on and taking off regular upper body clothing?: A Little Help from another person to put on and taking off regular lower body clothing?: A Little 6 Click Score: 20    End of Session Equipment Utilized During Treatment: Rolling walker;Back brace  OT Visit Diagnosis: Unsteadiness on feet (R26.81);Other abnormalities of gait and mobility (R26.89);Pain   Activity Tolerance Patient tolerated treatment well   Patient Left in chair;with call bell/phone within reach;with chair alarm set   Nurse Communication          Time: [id number] OT Time Calculation (min): 51 min  Charges: OT General Charges $OT Visit: 1 Visit OT Treatments $Self Care/Home Management : 38-52 mins  Tyrone Schimke, OT Acute Rehabilitation Services Pager: [phone] Office: [phone]    Thomas Mcclure 04/15/2019, 3:03 PM

## 2019-04-19 ENCOUNTER — Other Ambulatory Visit: Payer: Self-pay

## 2019-04-19 ENCOUNTER — Other Ambulatory Visit (HOSPITAL_COMMUNITY): Payer: Self-pay | Admitting: Neurosurgery

## 2019-04-19 ENCOUNTER — Ambulatory Visit (HOSPITAL_COMMUNITY)
Admission: RE | Admit: 2019-04-19 | Discharge: 2019-04-19 | Disposition: A | Discharge status: Home / Self Care | Payer: Medicare Other | Source: Ambulatory Visit | Attending: Surgery | Admitting: Surgery

## 2019-04-19 DIAGNOSIS — R600 Localized edema: Secondary | ICD-10-CM

## 2019-04-25 ENCOUNTER — Ambulatory Visit: Payer: Medicare Other | Attending: Neurosurgery | Admitting: Physical Therapy

## 2019-04-25 ENCOUNTER — Other Ambulatory Visit: Payer: Self-pay

## 2019-04-25 ENCOUNTER — Encounter: Payer: Self-pay | Admitting: Physical Therapy

## 2019-04-25 DIAGNOSIS — G8929 Other chronic pain: Secondary | ICD-10-CM | POA: Insufficient documentation

## 2019-04-25 DIAGNOSIS — R208 Other disturbances of skin sensation: Secondary | ICD-10-CM

## 2019-04-25 DIAGNOSIS — R296 Repeated falls: Secondary | ICD-10-CM | POA: Diagnosis present

## 2019-04-25 DIAGNOSIS — M545 Low back pain: Secondary | ICD-10-CM | POA: Insufficient documentation

## 2019-04-25 DIAGNOSIS — M6281 Muscle weakness (generalized): Secondary | ICD-10-CM | POA: Diagnosis present

## 2019-04-25 DIAGNOSIS — R293 Abnormal posture: Secondary | ICD-10-CM | POA: Diagnosis present

## 2019-04-25 DIAGNOSIS — R209 Unspecified disturbances of skin sensation: Secondary | ICD-10-CM | POA: Insufficient documentation

## 2019-04-25 DIAGNOSIS — R262 Difficulty in walking, not elsewhere classified: Secondary | ICD-10-CM | POA: Diagnosis present

## 2019-04-26 NOTE — Therapy (Signed)
[facility] [address] [place], [place], [address] Phone: [phone]   Fax:  [phone]  Physical Therapy Evaluation  Patient Details  Name: Thomas Mcclure MRN: [id number] Date of Birth: 02/08/1956 Referring Provider (PT): Newman Pies, MD   Encounter Date: 04/25/2019  PT End of Session - 04/26/19 1126    Visit Number  1    Number of Visits  17    Date for PT Re-Evaluation  06/25/19    Authorization Type  [place] - 10th visit PN.  $0 copay as long as Medicaid is active.  VL: MN    PT Start Time  1148    PT Stop Time  1238    PT Time Calculation (min)  50 min    Equipment Utilized During Treatment  Back brace    Activity Tolerance  Patient tolerated treatment well    Behavior During Therapy  WFL for tasks assessed/performed       Past Medical History:  Diagnosis Date  . Baker's cyst    Left calf  . CAD in native artery, 07/15/15 PCI of RCA with DES 07/16/2015   a.   NSTEMI 5/17: LHC - pLAD 20, pLCx 20, OM1 30, pRCA 100, EF 45-50%>> PCI: 2.5 x 24 mm Promus DES to RCA  //  b.   Echo 5/17: mild LVH, EF 50-55%, no RWMA, mod RVE //  c. LHC 6/17: pLAD 20, pLCx 20, OM1 50, pRCA stent ok, EF 35-45% with mod sized inf wall and basal segment aneurysm  . CHF (congestive heart failure) ([place])   . Chronic back pain    "down my back, down my legs" (02/18/2017)  . Constipation   . Constipation due to opioid therapy   . GERD (gastroesophageal reflux disease)    04/07/2019- not current  . Hyperlipidemia   . Hypertension    Dr. [name] [phone]  . Ischemic cardiomyopathy    a. LV-gram at time of LHC in 6/17 with EF 35-45%  //  b. Echo 7/17: EF 45-50%, inferior HK, grade 1 diastolic dysfunction, mildly dilated aortic root, moderately reduced RVSF, mild RAE  . Myocardial infarction ([place]) 2017  . 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 Numbness and tingling of both lower extremities    "on the outside  of both sides" (02/18/2017)  . Rheumatoid arthritis (HCC)    RA  . Tachycardia   . Type II diabetes mellitus ([place])    diet controlled    Past Surgical History:  Procedure Laterality Date  . BACK SURGERY     x3  . CARDIAC CATHETERIZATION N/A 07/15/2015   Procedure: Left Heart Cath and Coronary Angiography;  Surgeon: [name], MD;  Location: [facility];  Service: Cardiovascular;  Laterality: N/A;  . CARDIAC CATHETERIZATION N/A 07/15/2015   Procedure: Coronary Stent Intervention;  Surgeon: [name], MD;  Location: [facility];  Service: Cardiovascular;  Laterality: N/A;  . CARDIAC CATHETERIZATION N/A 08/07/2015   Procedure: Left Heart Cath and Coronary Angiography;  Surgeon: [name], MD;  Location: [facility];  Service: Cardiovascular;  Laterality: N/A;  . CORONARY ANGIOPLASTY WITH STENT PLACEMENT  07/2015  . LEFT HEART CATH AND CORONARY ANGIOGRAPHY N/A 02/19/2017   Procedure: LEFT HEART CATH AND CORONARY ANGIOGRAPHY;  Surgeon: Martinique, Peter M, MD;  Location: [facility];  Service: Cardiovascular;  Laterality: N/A;  . LUMBAR FUSION  04/11/2019  REVISION OF THORACOLUMBAR FUSION   . LUMBAR SPINE SURGERY  03/2009  & 2012  . MASS EXCISION N/A 04/19/2012   Procedure: removal of posterior cervical lipoma;  Surgeon: [name], MD;  Location: [facility];  Service: Neurosurgery;  Laterality: N/A;  Removal of posterior cervical lipoma  . POPLITEAL SYNOVIAL CYST EXCISION Left    "opened up behind my knee; scraped out arthritis"  . POSTERIOR LUMBAR FUSION 4 LEVEL N/A 11/22/2018   Procedure: Posterior Lateral and Interbody fusion - Lumbar one-Lumbar two; explore fusion, posterior instrumentation and fusion Thoracic ten to the ilium;  Surgeon: Newman Pies, MD;  Location: [facility];  Service: Neurosurgery;  Laterality: N/A;  . SPINAL CORD STIMULATOR INSERTION N/A 01/07/2013   Procedure:  SPINAL CORD STIMULATOR INSERTION;  Surgeon: [name], MD;   Location: [facility];  Service: Neurosurgery;  Laterality: N/A;  . SPINAL CORD STIMULATOR INSERTION N/A 02/09/2019   Procedure: REPLACEMENT OF LUMBAR SPINAL CORD STIMULATOR;  Surgeon: Newman Pies, MD;  Location: [facility];  Service: Neurosurgery;  Laterality: N/A;  Thoracic/Lumbar spine  . TONSILLECTOMY     patient denies  . TOTAL HIP ARTHROPLASTY Right 10/03/2016   Procedure: TOTAL HIP ARTHROPLASTY;  Surgeon: [name], MD;  Location: [facility];  Service: Orthopedics;  Laterality: Right;  . WISDOM TOOTH EXTRACTION     hx of    There were no vitals filed for this visit.   Subjective Assessment - 04/25/19 1156    Subjective  Pt reports this surgery has been the hardest.  Prior to surgery pt was using cane and RW depending on how his back felt.  Has new spinal cord stimulator since the last one malfunctioned.  Wearing TLSO when OOB - dons in sitting.  Back precautions - no bending, arching, twisting.  No lifting >5 lbs.   Harder to get dressed but is using adaptive equipment.  Wearing compression socks due to edema.  Has been trying to help with washing dishes and taking trash out.    Pertinent History  NSTEMI, HTN, HLD, chronic back pain with multiple surgeries and spinal cord stimulator, CAD, ischemic cardiomyopathy with coronary angioplasty with stent placement, L ventricular aneurysm, type 2 DM, obesity, R hip OA with R THA    Limitations  Standing;Walking;House hold activities    How long can you sit comfortably?  1 hour    How long can you stand comfortably?  10 minutes    How long can you walk comfortably?  30-40'    Patient Stated Goals  To walk without the RW    Currently in Pain?  Yes    Pain Score  7    9 at worst   Pain Location  Back    Pain Orientation  Lower    Pain Descriptors / Indicators  Radiating    Pain Type  Surgical pain    Pain Radiating Towards  bilat LE down to feet    Pain Onset  1 to 4 weeks ago    Pain Frequency  Constant    Aggravating Factors   walking  long distances, back brace rubs    Pain Relieving Factors  rest         [facility] PT Assessment - 04/25/19 1207      Assessment   Medical Diagnosis  thoracolumbar fusion revision    Referring Provider (PT)  Newman Pies, MD    Onset Date/Surgical Date  04/12/19    Prior Therapy  yes  Precautions   Precautions  Back;Other (comment)    Precaution Comments  NSTEMI, HTN, HLD, chronic back pain with multiple surgeries and spinal cord stimulator, CAD, ischemic cardiomyopathy with coronary angioplasty with stent placement, L ventricular aneurysm, type 2 DM, obesity, R hip OA with R THA    Required Braces or Orthoses  Spinal Brace    Spinal Brace  Thoracolumbosacral orthotic;Applied in sitting position      Restrictions   Other Position/Activity Restrictions  Lifting restricted to 5lb      Balance Screen   Has the patient fallen in the past 6 months  Yes    How many times?  3   tripped on the stairs   Has the patient had a decrease in activity level because of a fear of falling?   Yes      [place]  Private residence    Living Arrangements  Spouse/significant other    Type of [place] to enter    Entrance Stairs-Number of Steps  2    Entrance Stairs-Rails  Right;Left;Can reach both    [place]  One level    [place] - 2 wheels;Cane - single point;Toilet riser;Other (comment);Grab bars - tub/shower;Grab bars - toilet;Shower seat   sock aid, shoe horn, reacher     Prior Function   Level of Independence  Independent with household mobility with device;Independent with basic ADLs;Independent with homemaking with ambulation;Independent with transfers    Vocation  Other (comment);Retired    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      Observation/Other Assessments   Focus on Therapeutic Outcomes Human resources officer)   Not assessed      Sensation   Light Touch  Impaired by  gross assessment    Additional Comments  burning in bilat LE; numbness on lateral lower leg down into feet      Posture/Postural Control   Posture/Postural Control  Postural limitations    Postural Limitations  Flexed trunk    Posture Comments  unable to fully assess due to TLSO      ROM / Strength   AROM / PROM / Strength  Strength      Strength   Overall Strength  Deficits    Overall Strength Comments  RLE: 2/5 hip flexion, 4/5 knee extension and knee flexion, ankle DF.   LLE: 2/5 hip flexion, 4/5 knee extension, flexion, ankle Df      Transfers   Transfers  Sit to Stand;Stand to Sit;Stand Pivot Transfers    Sit to Stand  6: Modified independent (Device/Increase time)    Stand to Sit  6: Modified independent (Device/Increase time)    Stand Pivot Transfers  6: Modified independent (Device/Increase time)    Comments  with UE support and TLSO      Ambulation/Gait   Ambulation/Gait  Yes    Ambulation/Gait Assistance  6: Modified independent (Device/Increase time)    Ambulation Distance (Feet)  100 Feet    Assistive device  Rolling walker    Gait Pattern  Step-through pattern;Decreased stride length;Decreased hip/knee flexion - right;Decreased hip/knee flexion - left;Decreased dorsiflexion - right;Decreased dorsiflexion - left;Trunk flexed;Poor foot clearance - left;Poor foot clearance - right    Ambulation Surface  Level;Indoor    Stairs  Yes    Stairs Assistance  5: Supervision    Stair Management Technique  Two rails;Step to pattern;Forwards  Number of Stairs  4    Height of Stairs  6      Standardized Balance Assessment   Standardized Balance Assessment  Five Times Sit to Stand;10 meter walk test    Five times sit to stand comments   32.94 seconds from chair with use of arm rests    10 Meter Walk  29.09 seconds with RW or 1.12 ft/sec                 Objective measurements completed on examination: See above findings.              PT Education -  04/26/19 1125    Education Details  Clinical findings, PT POC and goals    Person(s) Educated  Patient    Methods  Explanation    Comprehension  Verbalized understanding       PT Short Term Goals - 04/26/19 1133      PT SHORT TERM GOAL #1   Title  Pt will participate in further assessment of falls risk and endurance with DGI and 2 minute walk test of endurance    Time  4    Period  Weeks    Status  New    Target Date  05/26/19      PT SHORT TERM GOAL #2   Title  Pt will demonstrate independence with initial HEP    Time  4    Period  Weeks    Status  New    Target Date  05/26/19      PT SHORT TERM GOAL #3   Title  Pt will demonstrate improvement in LE strength as indicated by improvement in five time sit to stand from chair with use of UE to </= 28 seconds    Baseline  32.94    Time  4    Period  Weeks    Status  New    Target Date  05/26/19      PT SHORT TERM GOAL #4   Title  Pt will improve gait velocity with RW to >/= 1.8 ft/sec    Baseline  1.12 ft/sec    Time  4    Period  Weeks    Status  New    Target Date  05/26/19      PT SHORT TERM GOAL #5   Title  Pt will negotiate 4 stairs with 2 rails, alternating sequence Mod I    Time  4    Period  Weeks    Status  New    Target Date  05/26/19        PT Long Term Goals - 04/26/19 1249      PT LONG TERM GOAL #1   Title  Pt will be independent with final HEP    Time  8    Period  Weeks    Status  New    Target Date  06/25/19      PT LONG TERM GOAL #2   Title  Pt will improve five time sit to stand to </= 22 seconds with use of UE    Time  8    Period  Weeks    Status  New    Target Date  06/25/19      PT LONG TERM GOAL #3   Title  Pt will improve gait velocity with LRAD to >/= 2.2 ft/sec    Time  8    Period  Weeks    Status  New  Target Date  06/25/19      PT LONG TERM GOAL #4   Title  Pt will improve DGI by 4 points with LRAD to indicate decreased falls when ambulating in the community     Baseline  TBD    Time  8    Period  Weeks    Status  New    Target Date  06/25/19      PT LONG TERM GOAL #5   Title  Pt will improve 2 minute walk test by 40 feet with LRAD to indicate improvement in functional endurance    Baseline  TBD    Time  8    Period  Weeks    Status  New    Target Date  06/25/19      Additional Long Term Goals   Additional Long Term Goals  Yes      PT LONG TERM GOAL #6   Title  Pt will negotiate 4 stairs with cane and one rail MOD I    Time  8    Period  Weeks    Status  New    Target Date  06/25/19             Plan - 04/26/19 1127    Clinical Impression Statement  Pt is a 64 year old male referred to Neuro OPPT for evaluation following revision of thoracolumbar fusion for spinal stenosis of lumbar region with neurogenic claudication and radiculopathy.  Pt's PMH is significant for the following: NSTEMI, HTN, HLD, chronic back pain with multiple surgeries and spinal cord stimulator, CAD, ischemic cardiomyopathy with coronary angioplasty with stent placement, L ventricular aneurysm, type 2 DM, obesity, R hip OA with R THA. The following deficits were noted during pt's exam: impaired LE sensation, impaired LE ROM and impaired LE strength, impaired posture, pain in low back radiating down bilat LE, impaired balance and impaired gait.  Pt's gait velocity and five time sit to stand scores indicate pt is at high risk for falls. Pt would benefit from skilled PT to address these impairments and functional limitations to maximize functional mobility independence and reduce falls risk.    Personal Factors and Comorbidities  Comorbidity 3+;Fitness;Past/Current Experience    Comorbidities  NSTEMI, HTN, HLD, chronic back pain with multiple surgeries and spinal cord stimulator, CAD, ischemic cardiomyopathy with coronary angioplasty with stent placement, L ventricular aneurysm, type 2 DM, obesity, R hip OA with R THA    Examination-Activity Limitations  Bed  Mobility;Bend;Dressing;Lift;Locomotion Level;Stairs;Stand    Examination-Participation Restrictions  Cleaning;Community Activity    Stability/Clinical Decision Making  Evolving/Moderate complexity    Clinical Decision Making  Moderate    Rehab Potential  Good    PT Frequency  2x / week    PT Duration  8 weeks    PT Treatment/Interventions  ADLs/Self Care Home Management;Aquatic Therapy;Cryotherapy;Moist Heat;DME Instruction;Gait training;Stair training;Functional mobility training;Therapeutic activities;Therapeutic exercise;Balance training;Neuromuscular re-education;Patient/family education;Orthotic Fit/Training;Scar mobilization;Passive range of motion;Dry needling;Taping;Manual techniques    PT Next Visit Plan  PATIENT WEARS TLSO, BACK PRECAUTIONS.  Assess DGI with RW and 2 minute walk test - reset baselines.  Initiate HEP focusing on gentle LE stretching, isometric strengthening, core strengthening (supine to stabilize back), standing balance and endurance, posture while walking    Consulted and Agree with Plan of Care  Patient       Patient will benefit from skilled therapeutic intervention in order to improve the following deficits and impairments:  Decreased activity tolerance, Decreased balance, Decreased endurance, Decreased mobility,  Decreased range of motion, Decreased strength, Difficulty walking, Increased edema, Impaired sensation, Postural dysfunction, Pain  Visit Diagnosis: Chronic bilateral low back pain, unspecified whether sciatica present  Muscle weakness (generalized)  Abnormal posture  Difficulty in walking, not elsewhere classified  Repeated falls  Other disturbances of skin sensation     Problem List Patient Active Problem List   Diagnosis Date Noted  . Chronic low back pain 02/09/2019  . Spinal stenosis of lumbar region with neurogenic claudication 11/22/2018  . Radiculopathy of lumbar region 05/31/2017  . HCAP (healthcare-associated pneumonia) 05/30/2017   . Leukocytosis 05/30/2017  . Lumbar pseudoarthrosis 05/27/2017  . Unstable angina ([place]) 02/18/2017  . Primary localized osteoarthritis of right hip 10/03/2016  . Chest pain, rule out acute myocardial infarction 05/01/2016  . Diabetes mellitus type 2, controlled, with complications ([place]) [id number]  . Obesity (BMI 30-39.9) 05/01/2016  . Left ventricular aneurysm 12/03/2015  . Ischemic cardiomyopathy   . CAD (coronary artery disease) 07/16/2015  . Hypokalemia 07/16/2015  . History of non-ST elevation myocardial infarction (NSTEMI) 07/15/2015  . Hypertension 07/15/2015  . Hyperlipidemia with target LDL less than 100 07/15/2015  . Back pain 07/15/2015  . GERD (gastroesophageal reflux disease) 06/18/2012  . Chronic radicular low back pain 06/18/2012    Rico Junker, PT, DPT 04/26/19    12:59 PM    [facility] [address] [place], [place], [address] Phone: [phone]   Fax:  [phone]  Name: Thomas Mcclure MRN: [id number] Date of Birth: 08-14-1955

## 2019-04-26 NOTE — Addendum Note (Signed)
Addended by: Misty Stanley F on: 04/26/2019 01:03 PM   Modules accepted: Orders

## 2019-04-29 ENCOUNTER — Encounter: Payer: Self-pay | Admitting: Rehabilitative and Restorative Service Providers"

## 2019-04-29 ENCOUNTER — Ambulatory Visit: Payer: Medicare Other | Admitting: Rehabilitative and Restorative Service Providers"

## 2019-04-29 ENCOUNTER — Other Ambulatory Visit: Payer: Self-pay

## 2019-04-29 DIAGNOSIS — R262 Difficulty in walking, not elsewhere classified: Secondary | ICD-10-CM

## 2019-04-29 DIAGNOSIS — M6281 Muscle weakness (generalized): Secondary | ICD-10-CM

## 2019-04-29 DIAGNOSIS — R293 Abnormal posture: Secondary | ICD-10-CM

## 2019-04-29 DIAGNOSIS — G8929 Other chronic pain: Secondary | ICD-10-CM

## 2019-04-29 NOTE — Therapy (Signed)
[facility] [address] [place], [place], [address] Phone: [phone]   Fax:  [phone]  Physical Therapy Treatment  Patient Details  Name: Thomas Mcclure MRN: [id number] Date of Birth: 07-04-1955 Referring Provider (PT): Newman Pies, MD   Encounter Date: 04/29/2019  PT End of Session - 04/29/19 1449    Visit Number  2    Number of Visits  17    Date for PT Re-Evaluation  06/25/19    Authorization Type  [place] - 10th visit PN.  $0 copay as long as Medicaid is active.  VL: MN    PT Start Time  1106    PT Stop Time  1150    PT Time Calculation (min)  44 min    Equipment Utilized During Treatment  Back brace    Activity Tolerance  Patient tolerated treatment well    Behavior During Therapy  WFL for tasks assessed/performed       Past Medical History:  Diagnosis Date  . Baker's cyst    Left calf  . CAD in native artery, 07/15/15 PCI of RCA with DES 07/16/2015   a.   NSTEMI 5/17: LHC - pLAD 20, pLCx 20, OM1 30, pRCA 100, EF 45-50%>> PCI: 2.5 x 24 mm Promus DES to RCA  //  b.   Echo 5/17: mild LVH, EF 50-55%, no RWMA, mod RVE //  c. LHC 6/17: pLAD 20, pLCx 20, OM1 50, pRCA stent ok, EF 35-45% with mod sized inf wall and basal segment aneurysm  . CHF (congestive heart failure) ([place])   . Chronic back pain    "down my back, down my legs" (02/18/2017)  . Constipation   . Constipation due to opioid therapy   . GERD (gastroesophageal reflux disease)    04/07/2019- not current  . Hyperlipidemia   . Hypertension    Dr. [name] [phone]  . Ischemic cardiomyopathy    a. LV-gram at time of LHC in 6/17 with EF 35-45%  //  b. Echo 7/17: EF 45-50%, inferior HK, grade 1 diastolic dysfunction, mildly dilated aortic root, moderately reduced RVSF, mild RAE  . Myocardial infarction ([facility] [place]) 2017  . 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 Numbness and tingling of both lower extremities    "on the outside  of both sides" (02/18/2017)  . Rheumatoid arthritis ([facility])    RA  . Tachycardia   . Type II diabetes mellitus ([place])    diet controlled    Past Surgical History:  Procedure Laterality Date  . BACK SURGERY     x3  . CARDIAC CATHETERIZATION N/A 07/15/2015   Procedure: Left Heart Cath and Coronary Angiography;  Surgeon: [name], MD;  Location: [facility];  Service: Cardiovascular;  Laterality: N/A;  . CARDIAC CATHETERIZATION N/A 07/15/2015   Procedure: Coronary Stent Intervention;  Surgeon: [name], MD;  Location: [facility];  Service: Cardiovascular;  Laterality: N/A;  . CARDIAC CATHETERIZATION N/A 08/07/2015   Procedure: Left Heart Cath and Coronary Angiography;  Surgeon: [name], MD;  Location: [facility];  Service: Cardiovascular;  Laterality: N/A;  . CORONARY ANGIOPLASTY WITH STENT PLACEMENT  07/2015  . LEFT HEART CATH AND CORONARY ANGIOGRAPHY N/A 02/19/2017   Procedure: LEFT HEART CATH AND CORONARY ANGIOGRAPHY;  Surgeon: Martinique, Peter M, MD;  Location: [facility];  Service: Cardiovascular;  Laterality: N/A;  . LUMBAR FUSION  04/11/2019  REVISION OF THORACOLUMBAR FUSION   . LUMBAR SPINE SURGERY  03/2009  & 2012  . MASS EXCISION N/A 04/19/2012   Procedure: removal of posterior cervical lipoma;  Surgeon: [name], MD;  Location: [facility];  Service: Neurosurgery;  Laterality: N/A;  Removal of posterior cervical lipoma  . POPLITEAL SYNOVIAL CYST EXCISION Left    "opened up behind my knee; scraped out arthritis"  . POSTERIOR LUMBAR FUSION 4 LEVEL N/A 11/22/2018   Procedure: Posterior Lateral and Interbody fusion - Lumbar one-Lumbar two; explore fusion, posterior instrumentation and fusion Thoracic ten to the ilium;  Surgeon: Newman Pies, MD;  Location: [facility];  Service: Neurosurgery;  Laterality: N/A;  . SPINAL CORD STIMULATOR INSERTION N/A 01/07/2013   Procedure:  SPINAL CORD STIMULATOR INSERTION;  Surgeon: [name], MD;   Location: [facility];  Service: Neurosurgery;  Laterality: N/A;  . SPINAL CORD STIMULATOR INSERTION N/A 02/09/2019   Procedure: REPLACEMENT OF LUMBAR SPINAL CORD STIMULATOR;  Surgeon: Newman Pies, MD;  Location: [facility];  Service: Neurosurgery;  Laterality: N/A;  Thoracic/Lumbar spine  . TONSILLECTOMY     patient denies  . TOTAL HIP ARTHROPLASTY Right 10/03/2016   Procedure: TOTAL HIP ARTHROPLASTY;  Surgeon: [name], MD;  Location: [facility];  Service: Orthopedics;  Laterality: Right;  . WISDOM TOOTH EXTRACTION     hx of    There were no vitals filed for this visit.  Subjective Assessment - 04/29/19 1107    Subjective  Patient reports that he thinks he is starting to slowly get his strength back.    Pertinent History  NSTEMI, HTN, HLD, chronic back pain with multiple surgeries and spinal cord stimulator, CAD, ischemic cardiomyopathy with coronary angioplasty with stent placement, L ventricular aneurysm, type 2 DM, obesity, R hip OA with R THA    Limitations  Standing;Walking;House hold activities    How long can you stand comfortably?  10 minutes    How long can you walk comfortably?  short distances in my home    Currently in Pain?  Yes    Pain Score  7     Pain Location  Back    Pain Orientation  Lower;Other (Comment)   center   Pain Descriptors / Indicators  Aching         [facility] PT Assessment - 04/29/19 1112      Standardized Balance Assessment   Standardized Balance Assessment  Dynamic Gait Index      Dynamic Gait Index   Level Surface  Moderate Impairment   20 feet in 14.31 seconds   Change in Gait Speed  Moderate Impairment    Gait with Horizontal Head Turns  Mild Impairment    Gait with Vertical Head Turns  Moderate Impairment    Gait and Pivot Turn  Moderate Impairment    Step Over Obstacle  Severe Impairment    Step Around Obstacles  Mild Impairment    Steps  Moderate Impairment    Total Score  9    DGI comment:  Scores <19/24 indicate high fall risk                        [facility] Adult PT Treatment/Exercise - 04/29/19 1112      Transfers   Transfers  Sit to Stand;Stand to [employer] Transfers    Sit to Stand  6: Modified independent (Device/Increase time)    Stand to Sit  6: Modified independent (Device/Increase time)  Comments  VCs to scoot to edge of mat/chair prior ot standing and TLSO       Ambulation/Gait   Ambulation/Gait  Yes    Ambulation/Gait Assistance  6: Modified independent (Device/Increase time)    Ambulation Distance (Feet)  200 Feet    Assistive device  Rolling walker    Gait Pattern  Step-through pattern;Decreased stride length;Decreased hip/knee flexion - right;Decreased hip/knee flexion - left;Decreased dorsiflexion - right;Decreased dorsiflexion - left;Trunk flexed;Poor foot clearance - left;Poor foot clearance - right    Ambulation Surface  Level;Indoor      Exercises   Exercises  Lumbar;Knee/Hip      Lumbar Exercises: Stretches   Pelvic Tilt  10 reps   3 second hold, 2 sets   Pelvic Tilt Limitations  required verbal and tactile cueing for proper form and technique      Knee/Hip Exercises: Supine   Other Supine Knee/Hip Exercises  Hooklying glute set (5 second hold, 10 reps) - required verbal cues for breathing sequence to minimize valsalva             PT Education - 04/29/19 1449    Education Details  updated HEP (see patient instructions)    Person(s) Educated  Patient    Methods  Explanation;Demonstration;Verbal cues;Handout    Comprehension  Verbalized understanding;Returned demonstration;Need further instruction       PT Short Term Goals - 04/29/19 1502      PT SHORT TERM GOAL #1   Title  Pt will participate in further assessment of falls risk and endurance with DGI and 2 minute walk test of endurance    Baseline  DGI 9/24 and 2 minute walk test = 201 feet both performed with 2WRW    Time  4    Period  Weeks    Status  New    Target Date  05/26/19      PT SHORT TERM  GOAL #2   Title  Pt will demonstrate independence with initial HEP    Time  4    Period  Weeks    Status  New    Target Date  05/26/19      PT SHORT TERM GOAL #3   Title  Pt will demonstrate improvement in LE strength as indicated by improvement in five time sit to stand from chair with use of UE to </= 28 seconds    Baseline  32.94    Time  4    Period  Weeks    Status  New    Target Date  05/26/19      PT SHORT TERM GOAL #4   Title  Pt will improve gait velocity with RW to >/= 1.8 ft/sec    Baseline  1.12 ft/sec    Time  4    Period  Weeks    Status  New    Target Date  05/26/19      PT SHORT TERM GOAL #5   Title  Pt will negotiate 4 stairs with 2 rails, alternating sequence Mod I    Time  4    Period  Weeks    Status  New    Target Date  05/26/19        PT Long Term Goals - 04/26/19 1249      PT LONG TERM GOAL #1   Title  Pt will be independent with final HEP    Time  8    Period  Weeks    Status  New  Target Date  06/25/19      PT LONG TERM GOAL #2   Title  Pt will improve five time sit to stand to </= 22 seconds with use of UE    Time  8    Period  Weeks    Status  New    Target Date  06/25/19      PT LONG TERM GOAL #3   Title  Pt will improve gait velocity with LRAD to >/= 2.2 ft/sec    Time  8    Period  Weeks    Status  New    Target Date  06/25/19      PT LONG TERM GOAL #4   Title  Pt will improve DGI by 4 points with LRAD to indicate decreased falls when ambulating in the community    Baseline  TBD    Time  8    Period  Weeks    Status  New    Target Date  06/25/19      PT LONG TERM GOAL #5   Title  Pt will improve 2 minute walk test by 40 feet with LRAD to indicate improvement in functional endurance    Baseline  TBD    Time  8    Period  Weeks    Status  New    Target Date  06/25/19      Additional Long Term Goals   Additional Long Term Goals  Yes      PT LONG TERM GOAL #6   Title  Pt will negotiate 4 stairs with cane and one  rail MOD I    Time  8    Period  Weeks    Status  New    Target Date  06/25/19            Plan - 04/29/19 1450    Clinical Impression Statement  PT performed DGI and 2 minute walk test with patient scoring 9/24 and 201 feet, respectively, which both indicate that he is at an increased risk for falling and has impaired endurance. Of note, his DGI score was greatly impacted by his use of 2WRW. Baselines were updated for this STG. Patient tolerated well introduction to supine lumbar stabilization thereapeutic exercise with initial HEP initiated accordingly.    Personal Factors and Comorbidities  Comorbidity 3+;Fitness;Past/Current Experience    Comorbidities  NSTEMI, HTN, HLD, chronic back pain with multiple surgeries and spinal cord stimulator, CAD, ischemic cardiomyopathy with coronary angioplasty with stent placement, L ventricular aneurysm, type 2 DM, obesity, R hip OA with R THA    Examination-Activity Limitations  Bed Mobility;Bend;Dressing;Lift;Locomotion Level;Stairs;Stand    Examination-Participation Restrictions  Cleaning;Community Activity    Stability/Clinical Decision Making  Evolving/Moderate complexity    Rehab Potential  Good    PT Frequency  2x / week    PT Duration  8 weeks    PT Treatment/Interventions  ADLs/Self Care Home Management;Aquatic Therapy;Cryotherapy;Moist Heat;DME Instruction;Gait training;Stair training;Functional mobility training;Therapeutic activities;Therapeutic exercise;Balance training;Neuromuscular re-education;Patient/family education;Orthotic Fit/Training;Scar mobilization;Passive range of motion;Dry needling;Taping;Manual techniques    PT Next Visit Plan  PATIENT WEARS TLSO, BACK PRECAUTIONS. progress (with update(s) to HEP as indicated) to include LE stretching, (and more) isometric strengthening; initiate standing balance and endurance activities    Consulted and Agree with Plan of Care  Patient       Patient will benefit from skilled therapeutic  intervention in order to improve the following deficits and impairments:  Decreased activity tolerance, Decreased balance, Decreased endurance, Decreased  mobility, Decreased range of motion, Decreased strength, Difficulty walking, Increased edema, Impaired sensation, Postural dysfunction, Pain  Visit Diagnosis: Chronic bilateral low back pain, unspecified whether sciatica present  Muscle weakness (generalized)  Abnormal posture  Difficulty in walking, not elsewhere classified     Problem List Patient Active Problem List   Diagnosis Date Noted  . Chronic low back pain 02/09/2019  . Spinal stenosis of lumbar region with neurogenic claudication 11/22/2018  . Radiculopathy of lumbar region 05/31/2017  . HCAP (healthcare-associated pneumonia) 05/30/2017  . Leukocytosis 05/30/2017  . Lumbar pseudoarthrosis 05/27/2017  . Unstable angina ([place]) 02/18/2017  . Primary localized osteoarthritis of right hip 10/03/2016  . Chest pain, rule out acute myocardial infarction 05/01/2016  . Diabetes mellitus type 2, controlled, with complications ([place]) [id number]  . Obesity (BMI 30-39.9) 05/01/2016  . Left ventricular aneurysm 12/03/2015  . Ischemic cardiomyopathy   . CAD (coronary artery disease) 07/16/2015  . Hypokalemia 07/16/2015  . History of non-ST elevation myocardial infarction (NSTEMI) 07/15/2015  . Hypertension 07/15/2015  . Hyperlipidemia with target LDL less than 100 07/15/2015  . Back pain 07/15/2015  . GERD (gastroesophageal reflux disease) 06/18/2012  . Chronic radicular low back pain 06/18/2012    Nubieber, Yaniah Thiemann, DPT  04/29/2019, 3:04 PM  [facility] [address] [place], [place], [address] Phone: [phone]   Fax:  [phone]  Name: Thomas Mcclure MRN: [id number] Date of Birth: 11/26/1955

## 2019-04-29 NOTE — Patient Instructions (Signed)
Access Code: [facility] URL: [email or web address] Date: 04/29/2019 Prepared by: Rosanne Ashing  Exercises Supine Posterior Pelvic Tilt - 10 reps - 2 sets - 2x daily - 7x weekly Hooklying Gluteal Sets - 10 reps - 2 sets - 2x daily - 7x weekly Supine Hip Adduction Isometric with Ball - 10 reps - 2 sets - 2x daily - 7x weekly

## 2019-05-01 NOTE — Progress Notes (Addendum)
Virtual Visit via Telephone Note   This visit type was conducted due to national recommendations for restrictions regarding the COVID-19 Pandemic (e.g. social distancing) in an effort to limit this patient's exposure and mitigate transmission in our community.  Due to his co-morbid illnesses, this patient is at least at moderate risk for complications without adequate follow up.  This format is felt to be most appropriate for this patient at this time.  The patient did not have access to video technology/had technical difficulties with video requiring transitioning to audio format only (telephone).  All issues noted in this document were discussed and addressed.  No physical exam could be performed with this format.  Please refer to the patient's chart for his  consent to telehealth for [facility].   The patient was identified using 2 identifiers.  Date:  05/02/2019   ID:  Thomas Mcclure, DOB Feb 03, 1956, MRN [id number]  Patient Location: Home Provider Location: Office  PCP:  Vassie Moment, MD  Cardiologist:  Sinclair Grooms, MD  Electrophysiologist:  None   Evaluation Performed:  Follow-Up Visit  Chief Complaint:  HTN on last visit.   History of Present Illness:    Thomas Mcclure is a 64 y.o. male with a hx of NSTEMI in 2017status post DES to the RCA. MI wascomplicated byIBLV aneurysm and chroniccombinedsystolic/diastolicheart [id number] percent2020), diabetesmellitusII, hypertension, hyperlipidemia, and chronic back pain status post spinal stimulator. Cath in 2018 demonstrated patentstent.  He has undergoing his fourth orthopedic/spine procedure in the last 3 years. This most recent procedure was a T2-L2 spine procedure by Dr. Newman Pies.. They feel that Dr. Rockne Menghini has done an excellent job. They have nothing by complaints about the care received at [facility]. They feel he was stranded and not adequately cared for.   Last visit He was doing relatively  well relative to recuperation. Still having significant back pain. They complaining Toprol causes him sluggish.  Last visit with change of bp  meds from lopressor to coreg 6.25 BID with goal for HR < 100  02/09/19 he had removal of old spinal cord stimulator and new one placed.   This was found after his device shocked him in 2 different incidences.      telehealth visit with HTN and coreg to 12.5 BID    he is doing well. No angina, no SOB.  He is recovering from second surgery.  His HR is in the 80s and BP is elevated, has been elevated recently, may be related to back pain Has been watching his diet. He was cleared for surgery.     Recent hospitalization for lumbar pseudoarthrosis with revision of thoracolumbar fusion 04/11/19  Today his back is much improved, though he relates Rt leg drags a little but PT is aware, but his pain is resolved and he feels much better.  He was having car issues so his BP is elevated and was taken before he took his meds.   He has no chest pain or SOB.  He will see PCP this week for yearly labs including lipids.  Last March his LDL was 57, HDL 31 tg 114.  I reviewed labs from PCP office His hgbA1C 7.5  .   His glucose was up some today followed by PCP  Pt is planning for COVID vaccine - encouraged to do so.   The patient does not have symptoms concerning for COVID-19 infection (fever, chills, cough, or new shortness of breath).    Past Medical History:  Diagnosis Date  . Baker's cyst    Left calf  . CAD in native artery, 07/15/15 PCI of RCA with DES 07/16/2015   a.   NSTEMI 5/17: LHC - pLAD 20, pLCx 20, OM1 30, pRCA 100, EF 45-50%>> PCI: 2.5 x 24 mm Promus DES to RCA  //  b.   Echo 5/17: mild LVH, EF 50-55%, no RWMA, mod RVE //  c. LHC 6/17: pLAD 20, pLCx 20, OM1 50, pRCA stent ok, EF 35-45% with mod sized inf wall and basal segment aneurysm  . CHF (congestive heart failure) ([place])   . Chronic back pain    "down my back, down my legs" (02/18/2017)  .  Constipation   . Constipation due to opioid therapy   . GERD (gastroesophageal reflux disease)    04/07/2019- not current  . Hyperlipidemia   . Hypertension    Dr. [name] [phone]  . Ischemic cardiomyopathy    a. LV-gram at time of LHC in 6/17 with EF 35-45%  //  b. Echo 7/17: EF 45-50%, inferior HK, grade 1 diastolic dysfunction, mildly dilated aortic root, moderately reduced RVSF, mild RAE  . Myocardial infarction ([place]) 2017  . 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 Numbness and tingling of both lower extremities    "on the outside of both sides" (02/18/2017)  . Rheumatoid arthritis (H[facility])    RA  . Tachycardia   . Type II diabetes mellitus ([place])    diet controlled   Past Surgical History:  Procedure Laterality Date  . BACK SURGERY     x3  . CARDIAC CATHETERIZATION N/A 07/15/2015   Procedure: Left Heart Cath and Coronary Angiography;  Surgeon: [name], MD;  Location: [place] [facility];  Service: Cardiovascular;  Laterality: N/A;  . CARDIAC CATHETERIZATION N/A 07/15/2015   Procedure: Coronary Stent Intervention;  Surgeon: [name], MD;  Location: [place] [facility];  Service: Cardiovascular;  Laterality: N/A;  . CARDIAC CATHETERIZATION N/A 08/07/2015   Procedure: Left Heart Cath and Coronary Angiography;  Surgeon: [name], MD;  Location: [place] [facility];  Service: Cardiovascular;  Laterality: N/A;  . CORONARY ANGIOPLASTY WITH STENT PLACEMENT  07/2015  . LEFT HEART CATH AND CORONARY ANGIOGRAPHY N/A 02/19/2017   Procedure: LEFT HEART CATH AND CORONARY ANGIOGRAPHY;  Surgeon: Martinique, Peter M, MD;  Location: [place] [facility];  Service: Cardiovascular;  Laterality: N/A;  . LUMBAR FUSION  04/11/2019   REVISION OF THORACOLUMBAR FUSION   . LUMBAR SPINE SURGERY  03/2009  & 2012  . MASS EXCISION N/A 04/19/2012   Procedure: removal of posterior cervical lipoma;  Surgeon: [name], MD;  Location: [place] [facility];  Service: Neurosurgery;   Laterality: N/A;  Removal of posterior cervical lipoma  . POPLITEAL SYNOVIAL CYST EXCISION Left    "opened up behind my knee; scraped out arthritis"  . POSTERIOR LUMBAR FUSION 4 LEVEL N/A 11/22/2018   Procedure: Posterior Lateral and Interbody fusion - Lumbar one-Lumbar two; explore fusion, posterior instrumentation and fusion Thoracic ten to the ilium;  Surgeon: Newman Pies, MD;  Location: [place];  Service: Neurosurgery;  Laterality: N/A;  . SPINAL CORD STIMULATOR INSERTION N/A 01/07/2013   Procedure:  SPINAL CORD STIMULATOR INSERTION;  Surgeon: [name], MD;  Location: [place] [facility];  Service: Neurosurgery;  Laterality: N/A;  . SPINAL CORD STIMULATOR INSERTION N/A 02/09/2019   Procedure: REPLACEMENT OF LUMBAR SPINAL CORD STIMULATOR;  Surgeon: Newman Pies, MD;  Location:  [place] [facility];  Service: Neurosurgery;  Laterality: N/A;  Thoracic/Lumbar spine  . TONSILLECTOMY     patient denies  . TOTAL HIP ARTHROPLASTY Right 10/03/2016   Procedure: TOTAL HIP ARTHROPLASTY;  Surgeon: [name], MD;  Location: [place];  Service: Orthopedics;  Laterality: Right;  . WISDOM TOOTH EXTRACTION     hx of     Current Meds  Medication Sig  . ACCU-CHEK AVIVA PLUS test strip   . acetaminophen (TYLENOL) 500 MG tablet Take 1,000 mg by mouth 2 (two) times daily as needed (pain.).  Marland Kitchen aspirin EC 81 MG tablet Take 1 tablet (81 mg total) by mouth daily.  Marland Kitchen atorvastatin (LIPITOR) 80 MG tablet Take 80 mg by mouth daily at 6 PM.  . carvedilol (COREG) 12.5 MG tablet Take 1 tablet (12.5 mg total) by mouth 2 (two) times daily.  . chlorthalidone (HYGROTON) 25 MG tablet Take 1 tablet (25 mg total) by mouth daily.  . cyclobenzaprine (FLEXERIL) 10 MG tablet Take 1 tablet (10 mg total) by mouth 3 (three) times daily as needed for muscle spasms.  . diclofenac sodium (VOLTAREN) 1 % GEL Apply 1 application topically 4 (four) times daily as needed (pain.).   Marland Kitchen DILT-XR 180 MG 24 hr capsule Take 180 mg by mouth daily.   Marland Kitchen  docusate sodium (COLACE) 100 MG capsule Take 1 capsule (100 mg total) by mouth 2 (two) times daily.  . DULoxetine (CYMBALTA) 60 MG capsule Take 1 capsule (60 mg total) by mouth daily.  Marland Kitchen glipiZIDE (GLUCOTROL) 5 MG tablet Take 5 mg by mouth daily before breakfast.   . lisinopril (PRINIVIL,ZESTRIL) 20 MG tablet Take 20 mg by mouth at bedtime.   Marland Kitchen lubiprostone (AMITIZA) 24 MCG capsule Take 48 mcg by mouth at bedtime.   . metFORMIN (GLUCOPHAGE XR) 500 MG 24 hr tablet Take 2 tablets (1,000 mg total) by mouth 2 (two) times daily.  . Multiple Vitamin (MULTIVITAMIN WITH MINERALS) TABS tablet Take 1 tablet by mouth daily.  Marland Kitchen omeprazole (PRILOSEC) 40 MG capsule Take 1 capsule (40 mg total) by mouth daily.  Marland Kitchen oxyCODONE 20 MG TABS Take 1 tablet (20 mg total) by mouth every 4 (four) hours as needed for moderate pain.  . polyethylene glycol powder (GLYCOLAX/MIRALAX) powder Take 17 g by mouth daily. Mix in 8 oz water, juice, soda, coffee or tea and drink  . potassium chloride SA (K-DUR,KLOR-CON) 20 MEQ tablet Take 1 tablet (20 mEq total) by mouth daily.  . pregabalin (LYRICA) 200 MG capsule Take 1 capsule (200 mg total) by mouth 2 (two) times daily.  . QC LIDOCAINE PAIN RELIEF EX Place 1 patch onto the skin daily as needed (pain.).  Marland Kitchen senna-docusate (SENOKOT S) 8.6-50 MG tablet Take 1 tablet by mouth 2 (two) times daily.     Allergies:   Brilinta [ticagrelor], Methylprednisolone, Prednisone, Toradol [ketorolac tromethamine], and Adhesive [tape]   Social History   Tobacco Use  . Smoking status: Former Smoker    Packs/day: 0.25    Years: 40.00    Pack years: 10.00    Types: Cigarettes    Quit date: 2008    Years since quitting: 13.1  . Smokeless tobacco: Never Used  . Tobacco comment: 02/18/2017 "quit in 2012"  Substance Use Topics  . Alcohol use: No    Alcohol/week: 0.0 standard drinks  . Drug use: No     Family Hx: The patient's family history includes Heart disease in his mother; Prostate  cancer in his brother.  ROS:  Please see the history of present illness.    General:no colds or fevers, no weight changes Skin:no rashes or ulcers HEENT:no blurred vision, no congestion CV:see HPI PUL:see HPI GI:no diarrhea constipation or melena, no indigestion GU:no hematuria, no dysuria MS:no joint pain, no claudication Neuro:no syncope, no lightheadedness Endo:no diabetes, no thyroid disease  All other systems reviewed and are negative.   Prior CV studies:   The following studies were reviewed today:  Echo 10/15/18  IMPRESSIONS   1. The left ventricle has mildly reduced systolic function, with an ejection fraction of 45-50%. The cavity size was normal. There is mildly increased left ventricular wall thickness. Left ventricular diastolic Doppler parameters are consistent with  impaired relaxation. 2. Abnormal septal motion inferior basal hypokinesis. 3. The right ventricle has normal systolic function. The cavity was normal. There is no increase in right ventricular wall thickness. 4. Mild thickening of the mitral valve leaflet. Mild calcification of the mitral valve leaflet. There is mild mitral annular calcification present. 5. The aortic valve is tricuspid. Mild thickening of the aortic valve. Sclerosis without any evidence of stenosis of the aortic valve. 6. The aorta is normal unless otherwise noted. 7. The interatrial septum was not well visualized.  FINDINGS Left Ventricle: The left ventricle has mildly reduced systolic function, with an ejection fraction of 45-50%. The cavity size was normal. There is mildly increased left ventricular wall thickness. Left ventricular diastolic Doppler parameters are  consistent with impaired relaxation. Abnormal septal motion inferior basal hypokinesis.  Right Ventricle: The right ventricle has normal systolic function. The cavity was normal. There is no increase in right ventricular wall thickness.  Left Atrium: Left  atrial size was normal in size.  Right Atrium: Right atrial size was normal in size. Right atrial pressure is estimated at 10 mmHg.  Interatrial Septum: The interatrial septum was not well visualized.  Pericardium: There is no evidence of pericardial effusion.  Mitral Valve: The mitral valve is normal in structure. Mild thickening of the mitral valve leaflet. Mild calcification of the mitral valve leaflet. There is mild mitral annular calcification present. Mitral valve regurgitation is trivial by color flow  Doppler.  Tricuspid Valve: The tricuspid valve is normal in structure. Tricuspid valve regurgitation is mild by color flow Doppler.  Aortic Valve: The aortic valve is tricuspid Mild thickening of the aortic valve. Sclerosis without any evidence of stenosis of the aortic valve. Aortic valve regurgitation was not visualized by color flow Doppler. There is no evidence of aortic valve  stenosis.  Pulmonic Valve: The pulmonic valve was grossly normal. Pulmonic valve regurgitation is mild by color flow Doppler.  Aorta: The aorta is normal unless otherwise noted.  Labs/Other Tests and Data Reviewed:    EKG:  No ECG reviewed.  Recent Labs: 04/12/2019: BUN 7; Creatinine, Ser 1.02; Hemoglobin 9.9; Platelets 288; Potassium 4.0; Sodium 140   Recent Lipid Panel Lab Results  Component Value Date/Time   CHOL 111 04/26/2018 10:35 AM   TRIG 114 04/26/2018 10:35 AM   HDL 31 (L) 04/26/2018 10:35 AM   CHOLHDL 3.6 04/26/2018 10:35 AM   CHOLHDL 4.3 02/19/2017 02:22 AM   LDLCALC 57 04/26/2018 10:35 AM    Wt Readings from Last 3 Encounters:  05/02/19 262 lb (118.8 kg)  04/14/19 265 lb 8.7 oz (120.5 kg)  04/07/19 268 lb 5 oz (121.7 kg)     Objective:    Vital Signs:  BP (!) 156/97   Pulse 88   Temp (!) 97 F (36.1  C)   Wt 262 lb (118.8 kg)   BMI 37.59 kg/m    VITAL SIGNS:  reviewed  General: NAD Lungs can speak in complete sentences without SOB Neuro A&O X 3   ASSESSMENT  & PLAN:    1. HTN continues to be elevated with goal <130/85.  he had not taken morning meds today  At home other readings have been 120/86 and 136/87  -he will call back in an hour to 30 min with repeat BP  He sees his PCP this week may need to increase BB 2. CAD no angina continue asprin and BB 3. HLD followed by PCP and to be checked this week. On Lipitor and per PCP labs last year LDL at goal of < 70 (his 57) 4. Combined systolic and diastolic HF without acute symptoms no edema per pt.   Continue ACE and BB 5. Diabetes -2 per PCP to see this week.    ADDENDUM  Pt called back and BP on recheck was 133/85  No change in meds.  COVID-19 Education: The signs and symptoms of COVID-19 were discussed with the patient and how to seek care for testing (follow up with PCP or arrange E-visit).  The importance of social distancing was discussed today.  Time:   Today, I have spent 11 minutes with the patient with telehealth technology discussing the above problems.     Medication Adjustments/Labs and Tests Ordered: Current medicines are reviewed at length with the patient today.  Concerns regarding medicines are outlined above.   Tests Ordered: No orders of the defined types were placed in this encounter.   Medication Changes: No orders of the defined types were placed in this encounter.   Follow Up:  In Person in 3 month(s) with [name]   Signed, Cecilie Kicks, NP  05/02/2019 8:18 AM    [place]

## 2019-05-02 ENCOUNTER — Other Ambulatory Visit: Payer: Self-pay

## 2019-05-02 ENCOUNTER — Telehealth (INDEPENDENT_AMBULATORY_CARE_PROVIDER_SITE_OTHER): Payer: Medicare Other | Admitting: Cardiology

## 2019-05-02 ENCOUNTER — Encounter: Payer: Self-pay | Admitting: Cardiology

## 2019-05-02 VITALS — BP 133/85 | HR 80 | Temp 97.0°F | Wt 262.0 lb

## 2019-05-02 DIAGNOSIS — I5042 Chronic combined systolic (congestive) and diastolic (congestive) heart failure: Secondary | ICD-10-CM

## 2019-05-02 DIAGNOSIS — I10 Essential (primary) hypertension: Secondary | ICD-10-CM

## 2019-05-02 DIAGNOSIS — E785 Hyperlipidemia, unspecified: Secondary | ICD-10-CM

## 2019-05-02 DIAGNOSIS — I2510 Atherosclerotic heart disease of native coronary artery without angina pectoris: Secondary | ICD-10-CM

## 2019-05-02 NOTE — Patient Instructions (Addendum)
Medication Instructions:  Your physician recommends that you continue on your current medications as directed. Please refer to the Current Medication list given to you today.  *If you need a refill on your cardiac medications before your next appointment, please call your pharmacy*   Lab Work: None ordered  If you have labs (blood work) drawn today and your tests are completely normal, you will receive your results only by: Marland Kitchen MyChart Message (if you have MyChart) OR . A paper copy in the mail If you have any lab test that is abnormal or we need to change your treatment, we will call you to review the results.   Testing/Procedures: None ordered   Follow-Up: At [facility], you and your health needs are our priority.  As part of our continuing mission to provide you with exceptional heart care, we have created designated Provider Care Teams.  These Care Teams include your primary Cardiologist (physician) and Advanced Practice Providers (APPs -  Physician Assistants and Nurse Practitioners) who all work together to provide you with the care you need, when you need it.  We recommend signing up for the patient portal called "MyChart".  Sign up information is provided on this After Visit Summary.  MyChart is used to connect with patients for Virtual Visits (Telemedicine).  Patients are able to view lab/test results, encounter notes, upcoming appointments, etc.  Non-urgent messages can be sent to your provider as well.   To learn more about what you can do with MyChart, go to [email or web address].    Your next appointment:   08/15/2019 ARRIVE AT 9:05 TO SEE DR [name]   The format for your next appointment:   In Person  Provider:   You may see Sinclair Grooms, MD or one of the following Advanced Practice Providers on your designated Care Team:    Truitt Merle, NP  Cecilie Kicks, NP  Kathyrn Drown, NP    Other Instructions Call the office back with your blood pressure  recheck.

## 2019-05-13 ENCOUNTER — Encounter: Payer: Self-pay | Admitting: Rehabilitation

## 2019-05-13 ENCOUNTER — Other Ambulatory Visit: Payer: Self-pay

## 2019-05-13 ENCOUNTER — Ambulatory Visit: Payer: Medicare Other | Admitting: Rehabilitation

## 2019-05-13 DIAGNOSIS — M6281 Muscle weakness (generalized): Secondary | ICD-10-CM

## 2019-05-13 DIAGNOSIS — G8929 Other chronic pain: Secondary | ICD-10-CM

## 2019-05-13 DIAGNOSIS — M545 Low back pain: Secondary | ICD-10-CM | POA: Diagnosis not present

## 2019-05-13 DIAGNOSIS — R293 Abnormal posture: Secondary | ICD-10-CM

## 2019-05-13 DIAGNOSIS — R262 Difficulty in walking, not elsewhere classified: Secondary | ICD-10-CM

## 2019-05-13 NOTE — Therapy (Signed)
[facility] [address] [place], [place], [address] Phone: [phone]   Fax:  [phone]  Physical Therapy Treatment  Patient Details  Name: Thomas Mcclure MRN: [id number] Date of Birth: 07-Dec-1955 Referring Provider (PT): Newman Pies, MD   Encounter Date: 05/13/2019  PT End of Session - 05/13/19 1357    Visit Number  3    Number of Visits  17    Date for PT Re-Evaluation  06/25/19    Authorization Type  [facility] - 10th visit PN.  $0 copay as long as Medicaid is active.  VL: MN    PT Start Time  1105    PT Stop Time  1149    PT Time Calculation (min)  44 min    Equipment Utilized During Treatment  Back brace    Activity Tolerance  Patient tolerated treatment well    Behavior During Therapy  WFL for tasks assessed/performed       Past Medical History:  Diagnosis Date  . Baker's cyst    Left calf  . CAD in native artery, 07/15/15 PCI of RCA with DES 07/16/2015   a.   NSTEMI 5/17: LHC - pLAD 20, pLCx 20, OM1 30, pRCA 100, EF 45-50%>> PCI: 2.5 x 24 mm Promus DES to RCA  //  b.   Echo 5/17: mild LVH, EF 50-55%, no RWMA, mod RVE //  c. LHC 6/17: pLAD 20, pLCx 20, OM1 50, pRCA stent ok, EF 35-45% with mod sized inf wall and basal segment aneurysm  . CHF (congestive heart failure) ([facility])   . Chronic back pain    "down my back, down my legs" (02/18/2017)  . Constipation   . Constipation due to opioid therapy   . GERD (gastroesophageal reflux disease)    04/07/2019- not current  . Hyperlipidemia   . Hypertension    Dr. [name] [phone]  . Ischemic cardiomyopathy    a. LV-gram at time of LHC in 6/17 with EF 35-45%  //  b. Echo 7/17: EF 45-50%, inferior HK, grade 1 diastolic dysfunction, mildly dilated aortic root, moderately reduced RVSF, mild RAE  . Myocardial infarction ([facility]) 2017  . Neuromuscular disorder ([facility])    "with nerve damage"  . Numbness and tingling of both lower extremities    "on the outside  of both sides" (02/18/2017)  . Rheumatoid arthritis ([facility])    RA  . Tachycardia   . Type II diabetes mellitus ([facility])    diet controlled    Past Surgical History:  Procedure Laterality Date  . BACK SURGERY     x3  . CARDIAC CATHETERIZATION N/A 07/15/2015   Procedure: Left Heart Cath and Coronary Angiography;  Surgeon: [name], MD;  Location: [facility];  Service: Cardiovascular;  Laterality: N/A;  . CARDIAC CATHETERIZATION N/A 07/15/2015   Procedure: Coronary Stent Intervention;  Surgeon: [name], MD;  Location: [facility];  Service: Cardiovascular;  Laterality: N/A;  . CARDIAC CATHETERIZATION N/A 08/07/2015   Procedure: Left Heart Cath and Coronary Angiography;  Surgeon: [name], MD;  Location: [facility];  Service: Cardiovascular;  Laterality: N/A;  . CORONARY ANGIOPLASTY WITH STENT PLACEMENT  07/2015  . LEFT HEART CATH AND CORONARY ANGIOGRAPHY N/A 02/19/2017   Procedure: LEFT HEART CATH AND CORONARY ANGIOGRAPHY;  Surgeon: Martinique, Peter M, MD;  Location: [facility];  Service: Cardiovascular;  Laterality: N/A;  . LUMBAR FUSION  04/11/2019  REVISION OF THORACOLUMBAR FUSION   . LUMBAR SPINE SURGERY  03/2009  & 2012  . MASS EXCISION N/A 04/19/2012   Procedure: removal of posterior cervical lipoma;  Surgeon: [name], MD;  Location: [facility];  Service: Neurosurgery;  Laterality: N/A;  Removal of posterior cervical lipoma  . POPLITEAL SYNOVIAL CYST EXCISION Left    "opened up behind my knee; scraped out arthritis"  . POSTERIOR LUMBAR FUSION 4 LEVEL N/A 11/22/2018   Procedure: Posterior Lateral and Interbody fusion - Lumbar one-Lumbar two; explore fusion, posterior instrumentation and fusion Thoracic ten to the ilium;  Surgeon: Newman Pies, MD;  Location: [facility];  Service: Neurosurgery;  Laterality: N/A;  . SPINAL CORD STIMULATOR INSERTION N/A 01/07/2013   Procedure:  SPINAL CORD STIMULATOR INSERTION;  Surgeon: [name], MD;   Location: [facility];  Service: Neurosurgery;  Laterality: N/A;  . SPINAL CORD STIMULATOR INSERTION N/A 02/09/2019   Procedure: REPLACEMENT OF LUMBAR SPINAL CORD STIMULATOR;  Surgeon: Newman Pies, MD;  [facility];  Service: Neurosurgery;  Laterality: N/A;  Thoracic/Lumbar spine  . TONSILLECTOMY     patient denies  . TOTAL HIP ARTHROPLASTY Right 10/03/2016   Procedure: TOTAL HIP ARTHROPLASTY;  Surgeon: [name], MD;  Location: [facility];  Service: Orthopedics;  Laterality: Right;  . WISDOM TOOTH EXTRACTION     hx of    There were no vitals filed for this visit.  Subjective Assessment - 05/13/19 1110    Subjective  Pt reports frustration with being asked same questions every visit and having different therapist each time.  Also upset at not being seen 2x/wk    Limitations  Standing;Walking;House hold activities    Currently in Pain?  Yes    Pain Score  7     Pain Location  Back    Pain Orientation  Lower    Pain Descriptors / Indicators  Aching    Pain Type  Surgical pain    Pain Onset  1 to 4 weeks ago    Pain Frequency  Constant    Aggravating Factors   walking long distnaces, back brace rubs    Pain Relieving Factors  rest                       [facility] Adult PT Treatment/Exercise - 05/13/19 1105      Ambulation/Gait   Ambulation/Gait  Yes    Ambulation/Gait Assistance  6: Modified independent (Device/Increase time);5: Supervision    Ambulation/Gait Assistance Details  Min cues for posture.  Pt reports R foot tends to drag at times and has diffculty clearing, therefore PT trialed use of R foot up brace vs R PLS ottobock AFO.  Ambulated x 115' x 2 reps (one lap with each type of brace) at mod I to S level.  Pt reports liking PLS AFO as it provides good support.  PT to reach out to MD regarding order for AFO.  Pt verbalized understanding.      Ambulation Distance (Feet)  15 Feet   x 2 reps    Assistive device  Rolling walker    Gait Pattern  Step-through  pattern;Decreased stride length;Decreased hip/knee flexion - right;Decreased hip/knee flexion - left;Decreased dorsiflexion - right;Decreased dorsiflexion - left;Trunk flexed;Poor foot clearance - left;Poor foot clearance - right    Ambulation Surface  Level;Indoor      Exercises   Exercises  Lumbar;Knee/Hip      Lumbar Exercises: [employer] operational officer  Right;Left;1 rep;60 seconds    Active Hamstring Stretch Limitations  Cues for posture and forward trunk lean from hip    Figure 4 Stretch Limitations  Attempted in seated position however was difficult due to brace.  may benefit from doing supine in future sessions    Gastroc Stretch  Right;Left;1 rep;60 seconds    Gastroc Stretch Limitations  standing runners stretch at // bars for support       Lumbar Exercises: Standing   Functional Squats Limitations  Side stepping in mini squat position with BUE support on outside of // bars.  Pt did very well maintaining back straight.  Performed x 4 laps       Knee/Hip Exercises: Standing   Hip Flexion  Stengthening;Both    Hip Flexion Limitations  Marching forwards x 4 laps with single UE support.  PT providing light support on opposite side.  Added to HEP and educated pt to use cane in opposite hand for safety at home.     Hip Extension  Stengthening;Both;1 set;10 reps    Extension Limitations  Cues for posture (not flexing forward) with knee extension          Access Code: [email or web address] URL: [email or web address] Date: 05/13/2019 Prepared by: Cameron Sprang  Exercises Standing Hip Extension with Counter Support - 1 x daily - 7 x weekly - 1 sets - 10 reps Side Stepping with Resistance at Thighs - 1 x daily - 7 x weekly - 1 sets - 4 reps Walking March - 1 x daily - 7 x weekly - 1 sets - 4 reps  ADD THIS TO PREVIOUS HEP IF ABLE     PT Education - 05/13/19 1357    Education Details  standing HEP    Person(s) Educated  Patient    Methods   Explanation;Demonstration;Handout    Comprehension  Verbalized understanding       PT Short Term Goals - 04/29/19 1502      PT SHORT TERM GOAL #1   Title  Pt will participate in further assessment of falls risk and endurance with DGI and 2 minute walk test of endurance    Baseline  DGI 9/24 and 2 minute walk test = 201 feet both performed with 2WRW    Time  4    Period  Weeks    Status  New    Target Date  05/26/19      PT SHORT TERM GOAL #2   Title  Pt will demonstrate independence with initial HEP    Time  4    Period  Weeks    Status  New    Target Date  05/26/19      PT SHORT TERM GOAL #3   Title  Pt will demonstrate improvement in LE strength as indicated by improvement in five time sit to stand from chair with use of UE to </= 28 seconds    Baseline  32.94    Time  4    Period  Weeks    Status  New    Target Date  05/26/19      PT SHORT TERM GOAL #4   Title  Pt will improve gait velocity with RW to >/= 1.8 ft/sec    Baseline  1.12 ft/sec    Time  4    Period  Weeks    Status  New    Target Date  05/26/19      PT SHORT TERM GOAL #5   Title  Pt will  negotiate 4 stairs with 2 rails, alternating sequence Mod I    Time  4    Period  Weeks    Status  New    Target Date  05/26/19        PT Long Term Goals - 04/26/19 1249      PT LONG TERM GOAL #1   Title  Pt will be independent with final HEP    Time  8    Period  Weeks    Status  New    Target Date  06/25/19      PT LONG TERM GOAL #2   Title  Pt will improve five time sit to stand to </= 22 seconds with use of UE    Time  8    Period  Weeks    Status  New    Target Date  06/25/19      PT LONG TERM GOAL #3   Title  Pt will improve gait velocity with LRAD to >/= 2.2 ft/sec    Time  8    Period  Weeks    Status  New    Target Date  06/25/19      PT LONG TERM GOAL #4   Title  Pt will improve DGI by 4 points with LRAD to indicate decreased falls when ambulating in the community    Baseline  TBD     Time  8    Period  Weeks    Status  New    Target Date  06/25/19      PT LONG TERM GOAL #5   Title  Pt will improve 2 minute walk test by 40 feet with LRAD to indicate improvement in functional endurance    Baseline  TBD    Time  8    Period  Weeks    Status  New    Target Date  06/25/19      Additional Long Term Goals   Additional Long Term Goals  Yes      PT LONG TERM GOAL #6   Title  Pt will negotiate 4 stairs with cane and one rail MOD I    Time  8    Period  Weeks    Status  New    Target Date  06/25/19            Plan - 05/13/19 1358    Clinical Impression Statement  Skilled session focused on assessment of RLE bracing/support for improved R foot clearance as pt reports R foot drag at times.  PLS AFO seemed to provide good support with less foot drag and therefore will send request to MD for AFO referral.  Also provided pt with standing exercises today, note difficulty in R SLS due to hip weakness.    Personal Factors and Comorbidities  Comorbidity 3+;Fitness;Past/Current Experience    Comorbidities  NSTEMI, HTN, HLD, chronic back pain with multiple surgeries and spinal cord stimulator, CAD, ischemic cardiomyopathy with coronary angioplasty with stent placement, L ventricular aneurysm, type 2 DM, obesity, R hip OA with R THA    Examination-Activity Limitations  Bed Mobility;Bend;Dressing;Lift;Locomotion Level;Stairs;Stand    Examination-Participation Restrictions  Cleaning;Community Activity    Stability/Clinical Decision Making  Evolving/Moderate complexity    Rehab Potential  Good    PT Frequency  2x / week    PT Duration  8 weeks    PT Treatment/Interventions  ADLs/Self Care Home Management;Aquatic Therapy;Cryotherapy;Moist Heat;DME Instruction;Gait training;Stair training;Functional mobility training;Therapeutic activities;Therapeutic exercise;Balance training;Neuromuscular re-education;Patient/family education;Orthotic  Fit/Training;Scar mobilization;Passive range of  motion;Dry needling;Taping;Manual techniques    PT Next Visit Plan  follow up on AFO request, PATIENT WEARS TLSO, BACK PRECAUTIONS. add seated hamstring stretch to HEP, (and more) isometric strengthening; initiate standing balance and endurance activities, R hip strengthening    Consulted and Agree with Plan of Care  Patient       Patient will benefit from skilled therapeutic intervention in order to improve the following deficits and impairments:  Decreased activity tolerance, Decreased balance, Decreased endurance, Decreased mobility, Decreased range of motion, Decreased strength, Difficulty walking, Increased edema, Impaired sensation, Postural dysfunction, Pain  Visit Diagnosis: Chronic bilateral low back pain, unspecified whether sciatica present  Muscle weakness (generalized)  Abnormal posture  Difficulty in walking, not elsewhere classified     Problem List Patient Active Problem List   Diagnosis Date Noted  . Chronic low back pain 02/09/2019  . Spinal stenosis of lumbar region with neurogenic claudication 11/22/2018  . Radiculopathy of lumbar region 05/31/2017  . HCAP (healthcare-associated pneumonia) 05/30/2017  . Leukocytosis 05/30/2017  . Lumbar pseudoarthrosis 05/27/2017  . Unstable angina ([facility]) 02/18/2017  . Primary localized osteoarthritis of right hip 10/03/2016  . Chest pain, rule out acute myocardial infarction 05/01/2016  . Diabetes mellitus type 2, controlled, with complications ([facility]) [id number]  . Obesity (BMI 30-39.9) 05/01/2016  . Left ventricular aneurysm 12/03/2015  . Ischemic cardiomyopathy   . CAD (coronary artery disease) 07/16/2015  . Hypokalemia 07/16/2015  . History of non-ST elevation myocardial infarction (NSTEMI) 07/15/2015  . Hypertension 07/15/2015  . Hyperlipidemia with target LDL less than 100 07/15/2015  . Back pain 07/15/2015  . GERD (gastroesophageal reflux disease) 06/18/2012  . Chronic radicular low back pain 06/18/2012    Cameron Sprang, PT, MPT [facility] [address] [place], [place], [address] Phone: [phone]   Fax:  [phone] 05/13/19, 2:02 PM  Name: Thomas Mcclure MRN: [id number] Date of Birth: 1955-03-29

## 2019-05-13 NOTE — Patient Instructions (Signed)
Access Code: [email or web address] URL: [email or web address] Date: 05/13/2019 Prepared by: Cameron Sprang  Exercises Standing Hip Extension with Counter Support - 1 x daily - 7 x weekly - 1 sets - 10 reps Side Stepping with Resistance at Thighs - 1 x daily - 7 x weekly - 1 sets - 4 reps Walking March - 1 x daily - 7 x weekly - 1 sets - 4 reps

## 2019-05-16 ENCOUNTER — Other Ambulatory Visit: Payer: Self-pay

## 2019-05-16 ENCOUNTER — Encounter: Payer: Self-pay | Admitting: Rehabilitation

## 2019-05-16 ENCOUNTER — Ambulatory Visit: Payer: Medicare Other | Admitting: Rehabilitation

## 2019-05-16 DIAGNOSIS — M545 Low back pain, unspecified: Secondary | ICD-10-CM

## 2019-05-16 DIAGNOSIS — G8929 Other chronic pain: Secondary | ICD-10-CM

## 2019-05-16 DIAGNOSIS — R262 Difficulty in walking, not elsewhere classified: Secondary | ICD-10-CM

## 2019-05-16 DIAGNOSIS — R296 Repeated falls: Secondary | ICD-10-CM

## 2019-05-16 DIAGNOSIS — M6281 Muscle weakness (generalized): Secondary | ICD-10-CM

## 2019-05-16 DIAGNOSIS — R293 Abnormal posture: Secondary | ICD-10-CM

## 2019-05-16 NOTE — Therapy (Signed)
[facility] [address] [place], [place], [address] Phone: [phone]   Fax:  [phone]  Physical Therapy Treatment  Patient Details  Name: Thomas Mcclure MRN: [id number] Date of Birth: 1955/10/12 Referring Provider (PT): Newman Pies, MD   Encounter Date: 05/16/2019  PT End of Session - 05/16/19 1255    Visit Number  4    Number of Visits  17    Date for PT Re-Evaluation  06/25/19    Authorization Type  [employer] - [place] - 10th visit PN.  $0 copay as long as Medicaid is active.  VL: MN    PT Start Time  1015    PT Stop Time  1100    PT Time Calculation (min)  45 min    Equipment Utilized During Treatment  Back brace    Activity Tolerance  Patient tolerated treatment well    Behavior During Therapy  WFL for tasks assessed/performed       Past Medical History:  Diagnosis Date  . Baker's cyst    Left calf  . CAD in native artery, 07/15/15 PCI of RCA with DES 07/16/2015   a.   NSTEMI 5/17: LHC - pLAD 20, pLCx 20, OM1 30, pRCA 100, EF 45-50%>> PCI: 2.5 x 24 mm Promus DES to RCA  //  b.   Echo 5/17: mild LVH, EF 50-55%, no RWMA, mod RVE //  c. LHC 6/17: pLAD 20, pLCx 20, OM1 50, pRCA stent ok, EF 35-45% with mod sized inf wall and basal segment aneurysm  . CHF (congestive heart failure) ([place])   . Chronic back pain    "down my back, down my legs" (02/18/2017)  . Constipation   . Constipation due to opioid therapy   . GERD (gastroesophageal reflux disease)    04/07/2019- not current  . Hyperlipidemia   . Hypertension    Dr. [name] [phone]  . Ischemic cardiomyopathy    a. LV-gram at time of LHC in 6/17 with EF 35-45%  //  b. Echo 7/17: EF 45-50%, inferior HK, grade 1 diastolic dysfunction, mildly dilated aortic root, moderately reduced RVSF, mild RAE  . Myocardial infarction ([place]) 2017  . 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 Numbness and tingling of both lower extremities    "on the outside  of both sides" (02/18/2017)  . Rheumatoid arthritis ([facility])    RA  . Tachycardia   . Type II diabetes mellitus ([place])    diet controlled    Past Surgical History:  Procedure Laterality Date  . BACK SURGERY     x3  . CARDIAC CATHETERIZATION N/A 07/15/2015   Procedure: Left Heart Cath and Coronary Angiography;  Surgeon: [name], MD;  Location: [facility];  Service: Cardiovascular;  Laterality: N/A;  . CARDIAC CATHETERIZATION N/A 07/15/2015   Procedure: Coronary Stent Intervention;  Surgeon: [name], MD;  Location: [facility];  Service: Cardiovascular;  Laterality: N/A;  . CARDIAC CATHETERIZATION N/A 08/07/2015   Procedure: Left Heart Cath and Coronary Angiography;  Surgeon: [name], MD;  Location: [facility];  Service: Cardiovascular;  Laterality: N/A;  . CORONARY ANGIOPLASTY WITH STENT PLACEMENT  07/2015  . LEFT HEART CATH AND CORONARY ANGIOGRAPHY N/A 02/19/2017   Procedure: LEFT HEART CATH AND CORONARY ANGIOGRAPHY;  Surgeon: Martinique, Peter M, MD;  Location: [facility];  Service: Cardiovascular;  Laterality: N/A;  . LUMBAR FUSION  04/11/2019  REVISION OF THORACOLUMBAR FUSION   . LUMBAR SPINE SURGERY  03/2009  & 2012  . MASS EXCISION N/A 04/19/2012   Procedure: removal of posterior cervical lipoma;  Surgeon: [name], MD;  Location: [facility];  Service: Neurosurgery;  Laterality: N/A;  Removal of posterior cervical lipoma  . POPLITEAL SYNOVIAL CYST EXCISION Left    "opened up behind my knee; scraped out arthritis"  . POSTERIOR LUMBAR FUSION 4 LEVEL N/A 11/22/2018   Procedure: Posterior Lateral and Interbody fusion - Lumbar one-Lumbar two; explore fusion, posterior instrumentation and fusion Thoracic ten to the ilium;  Surgeon: Newman Pies, MD;  Location: [facility];  Service: Neurosurgery;  Laterality: N/A;  . SPINAL CORD STIMULATOR INSERTION N/A 01/07/2013   Procedure:  SPINAL CORD STIMULATOR INSERTION;  Surgeon: [name], MD;   Location: [facility];  Service: Neurosurgery;  Laterality: N/A;  . SPINAL CORD STIMULATOR INSERTION N/A 02/09/2019   Procedure: REPLACEMENT OF LUMBAR SPINAL CORD STIMULATOR;  Surgeon: Newman Pies, MD;  Location: [facility];  Service: Neurosurgery;  Laterality: N/A;  Thoracic/Lumbar spine  . TONSILLECTOMY     patient denies  . TOTAL HIP ARTHROPLASTY Right 10/03/2016   Procedure: TOTAL HIP ARTHROPLASTY;  Surgeon: [name], MD;  Location: [facility];  Service: Orthopedics;  Laterality: Right;  . WISDOM TOOTH EXTRACTION     hx of    There were no vitals filed for this visit.  Subjective Assessment - 05/16/19 1016    Subjective  Had to attend nephew's funeral yesterday and did lots of sitting.  Was tired, but pain is better today.    Pertinent History  NSTEMI, HTN, HLD, chronic back pain with multiple surgeries and spinal cord stimulator, CAD, ischemic cardiomyopathy with coronary angioplasty with stent placement, L ventricular aneurysm, type 2 DM, obesity, R hip OA with R THA    Limitations  Standing;Walking;House hold activities    Currently in Pain?  Yes    Pain Score  6     Pain Location  Back    Pain Orientation  Lower    Pain Descriptors / Indicators  Aching    Pain Onset  1 to 4 weeks ago    Pain Frequency  Constant    Aggravating Factors   walking long distances, back brace rubs    Pain Relieving Factors  rest                       [facility] Adult PT Treatment/Exercise - 05/16/19 1030      Ambulation/Gait   Ambulation/Gait  Yes    Ambulation/Gait Assistance  5: Supervision    Ambulation/Gait Assistance Details  Cues for upright posture and increased stride length.  Ended session with trial of SPC x 115' with min/guard for light facilitation for upright posture and cues for sequencing/larger step length.  Cues for improved R hip extension during R stance phase.  Attempted another lap with SPC, however due to fatigue and marked gait compensations (Trendelenburg gait).   Pt was able to ambulate another lap with RW with improved posture and min cues for larger stride length which did improve with cues.     Ambulation Distance (Feet)  115 Feet    Assistive device  Rolling walker    Gait Pattern  Step-through pattern;Decreased stride length;Decreased hip/knee flexion - right;Decreased hip/knee flexion - left;Decreased dorsiflexion - right;Decreased dorsiflexion - left;Trunk flexed;Poor foot clearance - left;Poor foot clearance - right    Ambulation Surface  Level;Indoor  Neuro Re-ed    Neuro Re-ed Details   Rocker board in // bars: Vertically biased maintaining balance x 2 sets of 20 secs, progressing to posterior weight shift to anterior weight shift (arms out ahead of him when post, to arms by side when anterior) x 10 reps,  feet apart performing head turns and nods x 10 reps each.        Exercises   Exercises  Other Exercises    Other Exercises   Forward step ups to 6" steps with opposite LE march x 10 reps with BUE support progressing to lateral step up/down x 10 reps BUE reps (still 6" step).   Controlled step downs from 4" steps with BUEs (cues for being light as possible).  Pt needing frequent seated rest breaks due to fatigue.              PT Education - 05/16/19 1254    Education Details  ambulating with cane with wife on level paved outdoor surfaces for practice/endurance.    Person(s) Educated  Patient    Methods  Explanation    Comprehension  Verbalized understanding       PT Short Term Goals - 04/29/19 1502      PT SHORT TERM GOAL #1   Title  Pt will participate in further assessment of falls risk and endurance with DGI and 2 minute walk test of endurance    Baseline  DGI 9/24 and 2 minute walk test = 201 feet both performed with 2WRW    Time  4    Period  Weeks    Status  New    Target Date  05/26/19      PT SHORT TERM GOAL #2   Title  Pt will demonstrate independence with initial HEP    Time  4    Period  Weeks    Status   New    Target Date  05/26/19      PT SHORT TERM GOAL #3   Title  Pt will demonstrate improvement in LE strength as indicated by improvement in five time sit to stand from chair with use of UE to </= 28 seconds    Baseline  32.94    Time  4    Period  Weeks    Status  New    Target Date  05/26/19      PT SHORT TERM GOAL #4   Title  Pt will improve gait velocity with RW to >/= 1.8 ft/sec    Baseline  1.12 ft/sec    Time  4    Period  Weeks    Status  New    Target Date  05/26/19      PT SHORT TERM GOAL #5   Title  Pt will negotiate 4 stairs with 2 rails, alternating sequence Mod I    Time  4    Period  Weeks    Status  New    Target Date  05/26/19        PT Long Term Goals - 04/26/19 1249      PT LONG TERM GOAL #1   Title  Pt will be independent with final HEP    Time  8    Period  Weeks    Status  New    Target Date  06/25/19      PT LONG TERM GOAL #2   Title  Pt will improve five time sit to stand to </= 22 seconds with use  of UE    Time  8    Period  Weeks    Status  New    Target Date  06/25/19      PT LONG TERM GOAL #3   Title  Pt will improve gait velocity with LRAD to >/= 2.2 ft/sec    Time  8    Period  Weeks    Status  New    Target Date  06/25/19      PT LONG TERM GOAL #4   Title  Pt will improve DGI by 4 points with LRAD to indicate decreased falls when ambulating in the community    Baseline  TBD    Time  8    Period  Weeks    Status  New    Target Date  06/25/19      PT LONG TERM GOAL #5   Title  Pt will improve 2 minute walk test by 40 feet with LRAD to indicate improvement in functional endurance    Baseline  TBD    Time  8    Period  Weeks    Status  New    Target Date  06/25/19      Additional Long Term Goals   Additional Long Term Goals  Yes      PT LONG TERM GOAL #6   Title  Pt will negotiate 4 stairs with cane and one rail MOD I    Time  8    Period  Weeks    Status  New    Target Date  06/25/19            Plan -  05/16/19 1255    Clinical Impression Statement  During gait noted improved foot clearance, esp when cued by PT, therefore pt not wanting to pursue AFO at this time.  Addressed strengthening during session for BLEs as well as balance.  Pt limited by poor endurance needing several seated rest breaks throughout.    Personal Factors and Comorbidities  Comorbidity 3+;Fitness;Past/Current Experience    Comorbidities  NSTEMI, HTN, HLD, chronic back pain with multiple surgeries and spinal cord stimulator, CAD, ischemic cardiomyopathy with coronary angioplasty with stent placement, L ventricular aneurysm, type 2 DM, obesity, R hip OA with R THA    Examination-Activity Limitations  Bed Mobility;Bend;Dressing;Lift;Locomotion Level;Stairs;Stand    Examination-Participation Restrictions  Cleaning;Community Activity    Stability/Clinical Decision Making  Evolving/Moderate complexity    Rehab Potential  Good    PT Frequency  2x / week    PT Duration  8 weeks    PT Treatment/Interventions  ADLs/Self Care Home Management;Aquatic Therapy;Cryotherapy;Moist Heat;DME Instruction;Gait training;Stair training;Functional mobility training;Therapeutic activities;Therapeutic exercise;Balance training;Neuromuscular re-education;Patient/family education;Orthotic Fit/Training;Scar mobilization;Passive range of motion;Dry needling;Taping;Manual techniques    PT Next Visit Plan  continue balance, compliant surfaces, EC, gait with SPC as able,  PATIENT WEARS TLSO, BACK PRECAUTIONS. add seated hamstring stretch to HEP, (and more) isometric strengthening; initiate standing balance and endurance activities, R hip strengthening    Consulted and Agree with Plan of Care  Patient       Patient will benefit from skilled therapeutic intervention in order to improve the following deficits and impairments:  Decreased activity tolerance, Decreased balance, Decreased endurance, Decreased mobility, Decreased range of motion, Decreased strength,  Difficulty walking, Increased edema, Impaired sensation, Postural dysfunction, Pain  Visit Diagnosis: Chronic bilateral low back pain, unspecified whether sciatica present  Muscle weakness (generalized)  Abnormal posture  Difficulty in walking, not elsewhere classified  Repeated falls  Problem List Patient Active Problem List   Diagnosis Date Noted  . Chronic low back pain 02/09/2019  . Spinal stenosis of lumbar region with neurogenic claudication 11/22/2018  . Radiculopathy of lumbar region 05/31/2017  . HCAP (healthcare-associated pneumonia) 05/30/2017  . Leukocytosis 05/30/2017  . Lumbar pseudoarthrosis 05/27/2017  . Unstable angina ([place]) 02/18/2017  . Primary localized osteoarthritis of right hip 10/03/2016  . Chest pain, rule out acute myocardial infarction 05/01/2016  . Diabetes mellitus type 2, controlled, with complications ([place]) [id number]  . Obesity (BMI 30-39.9) 05/01/2016  . Left ventricular aneurysm 12/03/2015  . Ischemic cardiomyopathy   . CAD (coronary artery disease) 07/16/2015  . Hypokalemia 07/16/2015  . History of non-ST elevation myocardial infarction (NSTEMI) 07/15/2015  . Hypertension 07/15/2015  . Hyperlipidemia with target LDL less than 100 07/15/2015  . Back pain 07/15/2015  . GERD (gastroesophageal reflux disease) 06/18/2012  . Chronic radicular low back pain 06/18/2012    Cameron Sprang, PT, MPT [facility] [address] [place], [place], [address] Phone: [phone]   Fax:  [phone] 05/16/19, 12:58 PM  Name: Thomas Mcclure MRN: [id number] Date of Birth: 1955-04-27

## 2019-05-19 ENCOUNTER — Other Ambulatory Visit: Payer: Self-pay

## 2019-05-19 MED ORDER — POTASSIUM CHLORIDE CRYS ER 20 MEQ PO TBCR: 20.0000 meq | EXTENDED_RELEASE_TABLET | Freq: Every day | ORAL | 3 refills | Status: DC

## 2019-05-20 ENCOUNTER — Encounter: Payer: Self-pay | Admitting: Rehabilitation

## 2019-05-20 ENCOUNTER — Other Ambulatory Visit: Payer: Self-pay

## 2019-05-20 ENCOUNTER — Ambulatory Visit: Payer: Medicare Other | Admitting: Rehabilitation

## 2019-05-20 DIAGNOSIS — R262 Difficulty in walking, not elsewhere classified: Secondary | ICD-10-CM

## 2019-05-20 DIAGNOSIS — M545 Low back pain, unspecified: Secondary | ICD-10-CM

## 2019-05-20 DIAGNOSIS — G8929 Other chronic pain: Secondary | ICD-10-CM

## 2019-05-20 DIAGNOSIS — R296 Repeated falls: Secondary | ICD-10-CM

## 2019-05-20 DIAGNOSIS — M6281 Muscle weakness (generalized): Secondary | ICD-10-CM

## 2019-05-20 DIAGNOSIS — R293 Abnormal posture: Secondary | ICD-10-CM

## 2019-05-20 NOTE — Therapy (Signed)
[facility] [address] [place], [place], [address] Phone: [phone]   Fax:  [phone]  Physical Therapy Treatment  Patient Details  Name: Thomas Mcclure MRN: [id number] Date of Birth: May 03, 1955 Referring Provider (PT): Newman Pies, MD   Encounter Date: 05/20/2019  PT End of Session - 05/20/19 1254    Visit Number  5    Number of Visits  17    Date for PT Re-Evaluation  06/25/19    Authorization Type  [employer] - [place] - 10th visit PN.  $0 copay as long as Medicaid is active.  VL: MN    PT Start Time  1018    PT Stop Time  1102    PT Time Calculation (min)  44 min    Equipment Utilized During Treatment  Back brace    Activity Tolerance  Patient tolerated treatment well    Behavior During Therapy  WFL for tasks assessed/performed       Past Medical History:  Diagnosis Date  . Baker's cyst    Left calf  . CAD in native artery, 07/15/15 PCI of RCA with DES 07/16/2015   a.   NSTEMI 5/17: LHC - pLAD 20, pLCx 20, OM1 30, pRCA 100, EF 45-50%>> PCI: 2.5 x 24 mm Promus DES to RCA  //  b.   Echo 5/17: mild LVH, EF 50-55%, no RWMA, mod RVE //  c. LHC 6/17: pLAD 20, pLCx 20, OM1 50, pRCA stent ok, EF 35-45% with mod sized inf wall and basal segment aneurysm  . CHF (congestive heart failure) ([place])   . Chronic back pain    "down my back, down my legs" (02/18/2017)  . Constipation   . Constipation due to opioid therapy   . GERD (gastroesophageal reflux disease)    04/07/2019- not current  . Hyperlipidemia   . Hypertension    Dr. [name] [phone]  . Ischemic cardiomyopathy    a. LV-gram at time of LHC in 6/17 with EF 35-45%  //  b. Echo 7/17: EF 45-50%, inferior HK, grade 1 diastolic dysfunction, mildly dilated aortic root, moderately reduced RVSF, mild RAE  . Myocardial infarction ([place]) 2017  . 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 Numbness and tingling of both lower extremities    "on the outside  of both sides" (02/18/2017)  . Rheumatoid arthritis ([facility])    RA  . Tachycardia   . Type II diabetes mellitus ([place])    diet controlled    Past Surgical History:  Procedure Laterality Date  . BACK SURGERY     x3  . CARDIAC CATHETERIZATION N/A 07/15/2015   Procedure: Left Heart Cath and Coronary Angiography;  Surgeon: [name], MD;  Location: [facility];  Service: Cardiovascular;  Laterality: N/A;  . CARDIAC CATHETERIZATION N/A 07/15/2015   Procedure: Coronary Stent Intervention;  Surgeon: [name], MD;  Location: [facility];  Service: Cardiovascular;  Laterality: N/A;  . CARDIAC CATHETERIZATION N/A 08/07/2015   Procedure: Left Heart Cath and Coronary Angiography;  Surgeon: [name], MD;  Location: [facility];  Service: Cardiovascular;  Laterality: N/A;  . CORONARY ANGIOPLASTY WITH STENT PLACEMENT  07/2015  . LEFT HEART CATH AND CORONARY ANGIOGRAPHY N/A 02/19/2017   Procedure: LEFT HEART CATH AND CORONARY ANGIOGRAPHY;  Surgeon: Martinique, Peter M, MD;  Location: [facility];  Service: Cardiovascular;  Laterality: N/A;  . LUMBAR FUSION  04/11/2019  REVISION OF THORACOLUMBAR FUSION   . LUMBAR SPINE SURGERY  03/2009  & 2012  . MASS EXCISION N/A 04/19/2012   Procedure: removal of posterior cervical lipoma;  Surgeon: [name], MD;  Location: [facility];  Service: Neurosurgery;  Laterality: N/A;  Removal of posterior cervical lipoma  . POPLITEAL SYNOVIAL CYST EXCISION Left    "opened up behind my knee; scraped out arthritis"  . POSTERIOR LUMBAR FUSION 4 LEVEL N/A 11/22/2018   Procedure: Posterior Lateral and Interbody fusion - Lumbar one-Lumbar two; explore fusion, posterior instrumentation and fusion Thoracic ten to the ilium;  Surgeon: Newman Pies, MD;  Location: [facility];  Service: Neurosurgery;  Laterality: N/A;  . SPINAL CORD STIMULATOR INSERTION N/A 01/07/2013   Procedure:  SPINAL CORD STIMULATOR INSERTION;  Surgeon: [name], MD;   Location: [facility];  Service: Neurosurgery;  Laterality: N/A;  . SPINAL CORD STIMULATOR INSERTION N/A 02/09/2019   Procedure: REPLACEMENT OF LUMBAR SPINAL CORD STIMULATOR;  Surgeon: Newman Pies, MD;  Location: [facility];  Service: Neurosurgery;  Laterality: N/A;  Thoracic/Lumbar spine  . TONSILLECTOMY     patient denies  . TOTAL HIP ARTHROPLASTY Right 10/03/2016   Procedure: TOTAL HIP ARTHROPLASTY;  Surgeon: [name], MD;  Location: [facility];  Service: Orthopedics;  Laterality: Right;  . WISDOM TOOTH EXTRACTION     hx of    There were no vitals filed for this visit.  Subjective Assessment - 05/20/19 1018    Subjective  Pt reports doing very well this morning, no pain.    Pertinent History  NSTEMI, HTN, HLD, chronic back pain with multiple surgeries and spinal cord stimulator, CAD, ischemic cardiomyopathy with coronary angioplasty with stent placement, L ventricular aneurysm, type 2 DM, obesity, R hip OA with R THA    Limitations  Standing;Walking;House hold activities    Patient Stated Goals  To walk without the RW    Currently in Pain?  No/denies                       [facility] Adult PT Treatment/Exercise - 05/20/19 1024      Ambulation/Gait   Ambulation/Gait  Yes    Ambulation/Gait Assistance  5: Supervision    Ambulation/Gait Assistance Details  Gait x 230 with RW with emphasis on upright posture, decreased support on RW, and improved stide length with heel to toe contact.  Pt did very well but needs min cues for R LE abd as he tends to adduct to midline during gait.      Ambulation Distance (Feet)  230 Feet    Assistive device  Rolling walker    Gait Pattern  Step-through pattern;Decreased stride length;Decreased hip/knee flexion - right;Decreased hip/knee flexion - left;Decreased dorsiflexion - right;Decreased dorsiflexion - left;Trunk flexed;Poor foot clearance - left;Poor foot clearance - right    Ambulation Surface  Level;Indoor      High Level Balance    High Level Balance Comments  At counter top for single UE support: Stepping over orange barriers forward each step in different space with min/guard to min A .  Cues for improved R LE activation when in R stance.  Note more difficulty when he has RUE support vs LUE support.  Progressed to side stepping over barriers x 2 laps down and back with BUE support and tactile cues for RLE activation.       Exercises   Exercises  Knee/Hip    Other Exercises   Core strengthening sitting on  mat with back leaning on upside down chair, performing post pelvic tilt then moving off chair x 10 reps, PT backing up chair and adding 4.4 lbs medicine ball with arms outstretched.  Progressed to elevated mat without LE support performing task without medicine ball x 10 reps and with x 10 reps.  Cues for maintaining tilt and upright posture.  Sit<>stand at end of session x 10 reps without UE support with emphasis on slow controlled descent.  Pt did very well.        Lumbar Exercises: Aerobic   Other Aerobic Exercise  Scifit stepper with LEs only at level 3 resistance maintaining RPM at 60-70's throughout x 5 mins without rest.  Tolerated well               PT Short Term Goals - 04/29/19 1502      PT SHORT TERM GOAL #1   Title  Pt will participate in further assessment of falls risk and endurance with DGI and 2 minute walk test of endurance    Baseline  DGI 9/24 and 2 minute walk test = 201 feet both performed with 2WRW    Time  4    Period  Weeks    Status  New    Target Date  05/26/19      PT SHORT TERM GOAL #2   Title  Pt will demonstrate independence with initial HEP    Time  4    Period  Weeks    Status  New    Target Date  05/26/19      PT SHORT TERM GOAL #3   Title  Pt will demonstrate improvement in LE strength as indicated by improvement in five time sit to stand from chair with use of UE to </= 28 seconds    Baseline  32.94    Time  4    Period  Weeks    Status  New    Target Date  05/26/19       PT SHORT TERM GOAL #4   Title  Pt will improve gait velocity with RW to >/= 1.8 ft/sec    Baseline  1.12 ft/sec    Time  4    Period  Weeks    Status  New    Target Date  05/26/19      PT SHORT TERM GOAL #5   Title  Pt will negotiate 4 stairs with 2 rails, alternating sequence Mod I    Time  4    Period  Weeks    Status  New    Target Date  05/26/19        PT Long Term Goals - 04/26/19 1249      PT LONG TERM GOAL #1   Title  Pt will be independent with final HEP    Time  8    Period  Weeks    Status  New    Target Date  06/25/19      PT LONG TERM GOAL #2   Title  Pt will improve five time sit to stand to </= 22 seconds with use of UE    Time  8    Period  Weeks    Status  New    Target Date  06/25/19      PT LONG TERM GOAL #3   Title  Pt will improve gait velocity with LRAD to >/= 2.2 ft/sec    Time  8    Period  Weeks  Status  New    Target Date  06/25/19      PT LONG TERM GOAL #4   Title  Pt will improve DGI by 4 points with LRAD to indicate decreased falls when ambulating in the community    Baseline  TBD    Time  8    Period  Weeks    Status  New    Target Date  06/25/19      PT LONG TERM GOAL #5   Title  Pt will improve 2 minute walk test by 40 feet with LRAD to indicate improvement in functional endurance    Baseline  TBD    Time  8    Period  Weeks    Status  New    Target Date  06/25/19      Additional Long Term Goals   Additional Long Term Goals  Yes      PT LONG TERM GOAL #6   Title  Pt will negotiate 4 stairs with cane and one rail MOD I    Time  8    Period  Weeks    Status  New    Target Date  06/25/19            Plan - 05/20/19 1256    Clinical Impression Statement  Pt with no pain today and note improvement in RLE strength during tasks.  Continues to be limited by fatigue and needs frequent seated rest breaks.    Personal Factors and Comorbidities  Comorbidity 3+;Fitness;Past/Current Experience    Comorbidities   NSTEMI, HTN, HLD, chronic back pain with multiple surgeries and spinal cord stimulator, CAD, ischemic cardiomyopathy with coronary angioplasty with stent placement, L ventricular aneurysm, type 2 DM, obesity, R hip OA with R THA    Examination-Activity Limitations  Bed Mobility;Bend;Dressing;Lift;Locomotion Level;Stairs;Stand    Examination-Participation Restrictions  Cleaning;Community Activity    Stability/Clinical Decision Making  Evolving/Moderate complexity    Rehab Potential  Good    PT Frequency  2x / week    PT Duration  8 weeks    PT Treatment/Interventions  ADLs/Self Care Home Management;Aquatic Therapy;Cryotherapy;Moist Heat;DME Instruction;Gait training;Stair training;Functional mobility training;Therapeutic activities;Therapeutic exercise;Balance training;Neuromuscular re-education;Patient/family education;Orthotic Fit/Training;Scar mobilization;Passive range of motion;Dry needling;Taping;Manual techniques    PT Next Visit Plan  STGs 4/1, continue balance, compliant surfaces, EC, gait with SPC as able,  PATIENT WEARS TLSO, BACK PRECAUTIONS. add seated hamstring stretch to HEP, (and more) isometric strengthening; initiate standing balance and endurance activities, R hip strengthening    Consulted and Agree with Plan of Care  Patient       Patient will benefit from skilled therapeutic intervention in order to improve the following deficits and impairments:  Decreased activity tolerance, Decreased balance, Decreased endurance, Decreased mobility, Decreased range of motion, Decreased strength, Difficulty walking, Increased edema, Impaired sensation, Postural dysfunction, Pain  Visit Diagnosis: Chronic bilateral low back pain, unspecified whether sciatica present  Muscle weakness (generalized)  Abnormal posture  Difficulty in walking, not elsewhere classified  Repeated falls     Problem List Patient Active Problem List   Diagnosis Date Noted  . Chronic low back pain 02/09/2019   . Spinal stenosis of lumbar region with neurogenic claudication 11/22/2018  . Radiculopathy of lumbar region 05/31/2017  . HCAP (healthcare-associated pneumonia) 05/30/2017  . Leukocytosis 05/30/2017  . Lumbar pseudoarthrosis 05/27/2017  . Unstable angina ([place]) 02/18/2017  . Primary localized osteoarthritis of right hip 10/03/2016  . Chest pain, rule out acute myocardial infarction 05/01/2016  . Diabetes  mellitus type 2, controlled, with complications ([place]) [id number]  . Obesity (BMI 30-39.9) 05/01/2016  . Left ventricular aneurysm 12/03/2015  . Ischemic cardiomyopathy   . CAD (coronary artery disease) 07/16/2015  . Hypokalemia 07/16/2015  . History of non-ST elevation myocardial infarction (NSTEMI) 07/15/2015  . Hypertension 07/15/2015  . Hyperlipidemia with target LDL less than 100 07/15/2015  . Back pain 07/15/2015  . GERD (gastroesophageal reflux disease) 06/18/2012  . Chronic radicular low back pain 06/18/2012    Cameron Sprang, PT, MPT [facility] [address] [place], [place], [address] Phone: [phone]   Fax:  [phone] 05/20/19, 12:59 PM  Name: Thomas Mcclure MRN: [id number] Date of Birth: 1955-04-22

## 2019-05-23 ENCOUNTER — Other Ambulatory Visit: Payer: Self-pay

## 2019-05-23 ENCOUNTER — Encounter: Payer: Self-pay | Admitting: Rehabilitation

## 2019-05-23 ENCOUNTER — Ambulatory Visit: Payer: Medicare Other | Admitting: Rehabilitation

## 2019-05-23 DIAGNOSIS — M6281 Muscle weakness (generalized): Secondary | ICD-10-CM

## 2019-05-23 DIAGNOSIS — G8929 Other chronic pain: Secondary | ICD-10-CM

## 2019-05-23 DIAGNOSIS — R296 Repeated falls: Secondary | ICD-10-CM

## 2019-05-23 DIAGNOSIS — R262 Difficulty in walking, not elsewhere classified: Secondary | ICD-10-CM

## 2019-05-23 DIAGNOSIS — M545 Low back pain, unspecified: Secondary | ICD-10-CM

## 2019-05-23 DIAGNOSIS — R293 Abnormal posture: Secondary | ICD-10-CM

## 2019-05-23 NOTE — Patient Instructions (Signed)
Access Code: [email or web address] URL: [email or web address] Date: 05/23/2019 Prepared by: Cameron Sprang  Exercises Standing Hip Extension with Counter Support - 1 x daily - 7 x weekly - 1 sets - 10 reps Side Stepping with Resistance at Thighs - 1 x daily - 7 x weekly - 1 sets - 4 reps Walking March - 1 x daily - 7 x weekly - 1 sets - 4 reps Romberg Stance on Foam Pad - 1 x daily - 7 x weekly - 1 sets - 3 reps - 20 secs hold Romberg Stance Eyes Closed on Foam Pad - 1 x daily - 7 x weekly - 1 sets - 3 reps - 20 secs hold

## 2019-05-23 NOTE — Therapy (Signed)
[facility] [address] [place], [place], [address] Phone: [phone]   Fax:  [phone]  Physical Therapy Treatment  Patient Details  Name: Thomas Mcclure MRN: [id number] Date of Birth: Apr 17, 1955 Referring Provider (PT): Newman Pies, MD   Encounter Date: 05/23/2019  PT End of Session - 05/23/19 1244    Visit Number  6    Number of Visits  17    Date for PT Re-Evaluation  06/25/19    Authorization Type  [employer] - [place] - 10th visit PN.  $0 copay as long as Medicaid is active.  VL: MN    PT Start Time  1149    PT Stop Time  1231    PT Time Calculation (min)  42 min    Equipment Utilized During Treatment  Back brace    Activity Tolerance  Patient tolerated treatment well    Behavior During Therapy  WFL for tasks assessed/performed       Past Medical History:  Diagnosis Date  . Baker's cyst    Left calf  . CAD in native artery, 07/15/15 PCI of RCA with DES 07/16/2015   a.   NSTEMI 5/17: LHC - pLAD 20, pLCx 20, OM1 30, pRCA 100, EF 45-50%>> PCI: 2.5 x 24 mm Promus DES to RCA  //  b.   Echo 5/17: mild LVH, EF 50-55%, no RWMA, mod RVE //  c. LHC 6/17: pLAD 20, pLCx 20, OM1 50, pRCA stent ok, EF 35-45% with mod sized inf wall and basal segment aneurysm  . CHF (congestive heart failure) ([place])   . Chronic back pain    "down my back, down my legs" (02/18/2017)  . Constipation   . Constipation due to opioid therapy   . GERD (gastroesophageal reflux disease)    04/07/2019- not current  . Hyperlipidemia   . Hypertension    Dr. [name] [phone]  . Ischemic cardiomyopathy    a. LV-gram at time of LHC in 6/17 with EF 35-45%  //  b. Echo 7/17: EF 45-50%, inferior HK, grade 1 diastolic dysfunction, mildly dilated aortic root, moderately reduced RVSF, mild RAE  . Myocardial infarction ([place]) 2017  . 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 Numbness and tingling of both lower extremities    "on the outside  of both sides" (02/18/2017)  . Rheumatoid arthritis ([facility])    RA  . Tachycardia   . Type II diabetes mellitus ([place])    diet controlled    Past Surgical History:  Procedure Laterality Date  . BACK SURGERY     x3  . CARDIAC CATHETERIZATION N/A 07/15/2015   Procedure: Left Heart Cath and Coronary Angiography;  Surgeon: [name], MD;  Location: [facility];  Service: Cardiovascular;  Laterality: N/A;  . CARDIAC CATHETERIZATION N/A 07/15/2015   Procedure: Coronary Stent Intervention;  Surgeon: [name], MD;  Location: [facility];  Service: Cardiovascular;  Laterality: N/A;  . CARDIAC CATHETERIZATION N/A 08/07/2015   Procedure: Left Heart Cath and Coronary Angiography;  Surgeon: [name], MD;  Location: [facility];  Service: Cardiovascular;  Laterality: N/A;  . CORONARY ANGIOPLASTY WITH STENT PLACEMENT  07/2015  . LEFT HEART CATH AND CORONARY ANGIOGRAPHY N/A 02/19/2017   Procedure: LEFT HEART CATH AND CORONARY ANGIOGRAPHY;  Surgeon: Martinique, Peter M, MD;  Location: [facility];  Service: Cardiovascular;  Laterality: N/A;  . LUMBAR FUSION  04/11/2019  REVISION OF THORACOLUMBAR FUSION   . LUMBAR SPINE SURGERY  03/2009  & 2012  . MASS EXCISION N/A 04/19/2012   Procedure: removal of posterior cervical lipoma;  Surgeon: [name], MD;  Location: [facility];  Service: Neurosurgery;  Laterality: N/A;  Removal of posterior cervical lipoma  . POPLITEAL SYNOVIAL CYST EXCISION Left    "opened up behind my knee; scraped out arthritis"  . POSTERIOR LUMBAR FUSION 4 LEVEL N/A 11/22/2018   Procedure: Posterior Lateral and Interbody fusion - Lumbar one-Lumbar two; explore fusion, posterior instrumentation and fusion Thoracic ten to the ilium;  Surgeon: Newman Pies, MD;  Location: [facility];  Service: Neurosurgery;  Laterality: N/A;  . SPINAL CORD STIMULATOR INSERTION N/A 01/07/2013   Procedure:  SPINAL CORD STIMULATOR INSERTION;  Surgeon: [name], MD;   Location: [facility];  Service: Neurosurgery;  Laterality: N/A;  . SPINAL CORD STIMULATOR INSERTION N/A 02/09/2019   Procedure: REPLACEMENT OF LUMBAR SPINAL CORD STIMULATOR;  Surgeon: Newman Pies, MD;  Location: [facility];  Service: Neurosurgery;  Laterality: N/A;  Thoracic/Lumbar spine  . TONSILLECTOMY     patient denies  . TOTAL HIP ARTHROPLASTY Right 10/03/2016   Procedure: TOTAL HIP ARTHROPLASTY;  Surgeon: [name], MD;  Location: [facility];  Service: Orthopedics;  Laterality: Right;  . WISDOM TOOTH EXTRACTION     hx of    There were no vitals filed for this visit.  Subjective Assessment - 05/23/19 1151    Subjective  Not doing as well today, did a lot of standing at church yesterday and some walking without walker.    Pertinent History  NSTEMI, HTN, HLD, chronic back pain with multiple surgeries and spinal cord stimulator, CAD, ischemic cardiomyopathy with coronary angioplasty with stent placement, L ventricular aneurysm, type 2 DM, obesity, R hip OA with R THA    Limitations  Standing;Walking;House hold activities    Patient Stated Goals  To walk without the RW    Currently in Pain?  Yes    Pain Score  4     Pain Location  Back    Pain Orientation  Lower    Pain Descriptors / Indicators  Aching    Pain Type  Surgical pain    Pain Onset  More than a month ago    Pain Frequency  Intermittent    Aggravating Factors   walking long periods, standing long periods    Pain Relieving Factors  rest                       [facility] Adult PT Treatment/Exercise - 05/23/19 1154      Ambulation/Gait   Ambulation/Gait  Yes    Ambulation/Gait Assistance  5: Supervision    Ambulation/Gait Assistance Details  Pt much more forward flexed today in trunk with cues for upright posture and light support on RW.      Ambulation Distance (Feet)  100 Feet    Assistive device  Rolling walker    Gait Pattern  Step-through pattern;Decreased stride length;Decreased hip/knee flexion -  right;Decreased hip/knee flexion - left;Decreased dorsiflexion - right;Decreased dorsiflexion - left;Trunk flexed;Poor foot clearance - left;Poor foot clearance - right    Ambulation Surface  Level;Indoor      High Level Balance   High Level Balance Comments  Corner balance exercises:  See HEP for added exercises.  In // bars standing on foam airex alternating cone taps x 10 reps with BUE support>single UE support with emphasis on  upright posture (looking in mirror) and improved activation of proximal RLE.  Progressed to tapping across and then in front before returning to ground for increased time spent in SLS x 10 reps.  Again progressing from BUE support>single UE support.  Continues to demonstrate R hip abd weakness with R stance activities.        Exercises   Exercises  Knee/Hip      Lumbar Exercises: Aerobic   Other Aerobic Exercise  Scifit stepper with BLEs only at level 3 resistance x 5 mins with moderate fatigue but able to maintain RPMs in 60-70's.        Lumbar Exercises: Standing   Other Standing Lumbar Exercises  In // bars lateral step up to 6" step with UE overhead press with 5lb kettle bell x 8 reps on each side.   Squats with 5lb kettle bell lift when in squat x 10 reps.           Access Code: [email or web address] URL: [email or web address] Date: 05/23/2019 Prepared by: Cameron Sprang  Exercises Standing Hip Extension with Counter Support - 1 x daily - 7 x weekly - 1 sets - 10 reps Side Stepping with Resistance at Thighs - 1 x daily - 7 x weekly - 1 sets - 4 reps Walking March - 1 x daily - 7 x weekly - 1 sets - 4 reps Romberg Stance on Foam Pad - 1 x daily - 7 x weekly - 1 sets - 3 reps - 20 secs hold Romberg Stance Eyes Closed on Foam Pad - 1 x daily - 7 x weekly - 1 sets - 3 reps - 20 secs hold     PT Education - 05/23/19 1238    Education Details  importance of using AD at all times for gait at home/community.    Person(s) Educated  Patient    Methods   Explanation    Comprehension  Verbalized understanding       PT Short Term Goals - 04/29/19 1502      PT SHORT TERM GOAL #1   Title  Pt will participate in further assessment of falls risk and endurance with DGI and 2 minute walk test of endurance    Baseline  DGI 9/24 and 2 minute walk test = 201 feet both performed with 2WRW    Time  4    Period  Weeks    Status  New    Target Date  05/26/19      PT SHORT TERM GOAL #2   Title  Pt will demonstrate independence with initial HEP    Time  4    Period  Weeks    Status  New    Target Date  05/26/19      PT SHORT TERM GOAL #3   Title  Pt will demonstrate improvement in LE strength as indicated by improvement in five time sit to stand from chair with use of UE to </= 28 seconds    Baseline  32.94    Time  4    Period  Weeks    Status  New    Target Date  05/26/19      PT SHORT TERM GOAL #4   Title  Pt will improve gait velocity with RW to >/= 1.8 ft/sec    Baseline  1.12 ft/sec    Time  4    Period  Weeks    Status  New    Target Date  05/26/19  PT SHORT TERM GOAL #5   Title  Pt will negotiate 4 stairs with 2 rails, alternating sequence Mod I    Time  4    Period  Weeks    Status  New    Target Date  05/26/19        PT Long Term Goals - 04/26/19 1249      PT LONG TERM GOAL #1   Title  Pt will be independent with final HEP    Time  8    Period  Weeks    Status  New    Target Date  06/25/19      PT LONG TERM GOAL #2   Title  Pt will improve five time sit to stand to </= 22 seconds with use of UE    Time  8    Period  Weeks    Status  New    Target Date  06/25/19      PT LONG TERM GOAL #3   Title  Pt will improve gait velocity with LRAD to >/= 2.2 ft/sec    Time  8    Period  Weeks    Status  New    Target Date  06/25/19      PT LONG TERM GOAL #4   Title  Pt will improve DGI by 4 points with LRAD to indicate decreased falls when ambulating in the community    Baseline  TBD    Time  8    Period   Weeks    Status  New    Target Date  06/25/19      PT LONG TERM GOAL #5   Title  Pt will improve 2 minute walk test by 40 feet with LRAD to indicate improvement in functional endurance    Baseline  TBD    Time  8    Period  Weeks    Status  New    Target Date  06/25/19      Additional Long Term Goals   Additional Long Term Goals  Yes      PT LONG TERM GOAL #6   Title  Pt will negotiate 4 stairs with cane and one rail MOD I    Time  8    Period  Weeks    Status  New    Target Date  06/25/19            Plan - 05/23/19 1246    Clinical Impression Statement  Pt reports increased fatigue today from being in church yesterday and gait at home without AD.  Discussed that due to high fall risk,  he should be using AD at all times.  Pt verbalized understanding.  Continue to address BLE strengthening and balance.  Added 2 corner balance exercises to HEP.    Personal Factors and Comorbidities  Comorbidity 3+;Fitness;Past/Current Experience    Comorbidities  NSTEMI, HTN, HLD, chronic back pain with multiple surgeries and spinal cord stimulator, CAD, ischemic cardiomyopathy with coronary angioplasty with stent placement, L ventricular aneurysm, type 2 DM, obesity, R hip OA with R THA    Examination-Activity Limitations  Bed Mobility;Bend;Dressing;Lift;Locomotion Level;Stairs;Stand    Examination-Participation Restrictions  Cleaning;Community Activity    Stability/Clinical Decision Making  Evolving/Moderate complexity    Rehab Potential  Good    PT Frequency  2x / week    PT Duration  8 weeks    PT Treatment/Interventions  ADLs/Self Care Home Management;Aquatic Therapy;Cryotherapy;Moist Heat;DME Instruction;Gait training;Stair training;Functional mobility training;Therapeutic activities;Therapeutic  exercise;Balance training;Neuromuscular re-education;Patient/family education;Orthotic Fit/Training;Scar mobilization;Passive range of motion;Dry needling;Taping;Manual techniques    PT Next Visit  Plan  STGs 4/1, continue balance, compliant surfaces, EC, gait with SPC as able,  PATIENT WEARS TLSO, BACK PRECAUTIONS. add seated hamstring stretch to HEP, (and more) isometric strengthening; initiate standing balance and endurance activities, R hip strengthening    PT Home Exercise Plan  [email or web address]    Consulted and Agree with Plan of Care  Patient       Patient will benefit from skilled therapeutic intervention in order to improve the following deficits and impairments:  Decreased activity tolerance, Decreased balance, Decreased endurance, Decreased mobility, Decreased range of motion, Decreased strength, Difficulty walking, Increased edema, Impaired sensation, Postural dysfunction, Pain  Visit Diagnosis: Chronic bilateral low back pain, unspecified whether sciatica present  Muscle weakness (generalized)  Abnormal posture  Difficulty in walking, not elsewhere classified  Repeated falls     Problem List Patient Active Problem List   Diagnosis Date Noted  . Chronic low back pain 02/09/2019  . Spinal stenosis of lumbar region with neurogenic claudication 11/22/2018  . Radiculopathy of lumbar region 05/31/2017  . HCAP (healthcare-associated pneumonia) 05/30/2017  . Leukocytosis 05/30/2017  . Lumbar pseudoarthrosis 05/27/2017  . Unstable angina ([place]) 02/18/2017  . Primary localized osteoarthritis of right hip 10/03/2016  . Chest pain, rule out acute myocardial infarction 05/01/2016  . Diabetes mellitus type 2, controlled, with complications ([place]) [id number]  . Obesity (BMI 30-39.9) 05/01/2016  . Left ventricular aneurysm 12/03/2015  . Ischemic cardiomyopathy   . CAD (coronary artery disease) 07/16/2015  . Hypokalemia 07/16/2015  . History of non-ST elevation myocardial infarction (NSTEMI) 07/15/2015  . Hypertension 07/15/2015  . Hyperlipidemia with target LDL less than 100 07/15/2015  . Back pain 07/15/2015  . GERD (gastroesophageal reflux disease) 06/18/2012  . Chronic  radicular low back pain 06/18/2012    Cameron Sprang, PT, MPT [facility] [address] [place], [place], [address] Phone: [phone]   Fax:  [phone] 05/23/19, 12:51 PM  Name: Thomas Mcclure MRN: [id number] Date of Birth: Jan 07, 1956

## 2019-05-26 ENCOUNTER — Other Ambulatory Visit: Payer: Self-pay

## 2019-05-26 ENCOUNTER — Encounter: Payer: Self-pay | Admitting: Rehabilitation

## 2019-05-26 ENCOUNTER — Ambulatory Visit: Payer: Medicare Other | Attending: Family Medicine | Admitting: Rehabilitation

## 2019-05-26 DIAGNOSIS — M545 Low back pain, unspecified: Secondary | ICD-10-CM

## 2019-05-26 DIAGNOSIS — M62830 Muscle spasm of back: Secondary | ICD-10-CM | POA: Insufficient documentation

## 2019-05-26 DIAGNOSIS — R209 Unspecified disturbances of skin sensation: Secondary | ICD-10-CM | POA: Diagnosis present

## 2019-05-26 DIAGNOSIS — R293 Abnormal posture: Secondary | ICD-10-CM | POA: Diagnosis present

## 2019-05-26 DIAGNOSIS — R296 Repeated falls: Secondary | ICD-10-CM

## 2019-05-26 DIAGNOSIS — M6281 Muscle weakness (generalized): Secondary | ICD-10-CM

## 2019-05-26 DIAGNOSIS — G8929 Other chronic pain: Secondary | ICD-10-CM | POA: Diagnosis present

## 2019-05-26 DIAGNOSIS — R262 Difficulty in walking, not elsewhere classified: Secondary | ICD-10-CM | POA: Diagnosis present

## 2019-05-26 NOTE — Therapy (Signed)
[facility] [address] [place], [place], [address] Phone: [phone]   Fax:  [phone]  Physical Therapy Treatment  Patient Details  Name: Thomas Mcclure MRN: [id number] Date of Birth: Sep 04, 1955 Referring Provider (PT): Newman Pies, MD   Encounter Date: 05/26/2019  PT End of Session - 05/26/19 1707    Visit Number  7    Number of Visits  17    Date for PT Re-Evaluation  06/25/19    Authorization Type  [employer] - [place] - 10th visit PN.  $0 copay as long as Medicaid is active.  VL: MN    PT Start Time  0246    PT Stop Time  0330    PT Time Calculation (min)  44 min    Equipment Utilized During Treatment  Back brace    Activity Tolerance  Patient tolerated treatment well    Behavior During Therapy  WFL for tasks assessed/performed       Past Medical History:  Diagnosis Date  . Baker's cyst    Left calf  . CAD in native artery, 07/15/15 PCI of RCA with DES 07/16/2015   a.   NSTEMI 5/17: LHC - pLAD 20, pLCx 20, OM1 30, pRCA 100, EF 45-50%>> PCI: 2.5 x 24 mm Promus DES to RCA  //  b.   Echo 5/17: mild LVH, EF 50-55%, no RWMA, mod RVE //  c. LHC 6/17: pLAD 20, pLCx 20, OM1 50, pRCA stent ok, EF 35-45% with mod sized inf wall and basal segment aneurysm  . CHF (congestive heart failure) ([facility])   . Chronic back pain    "down my back, down my legs" (02/18/2017)  . Constipation   . Constipation due to opioid therapy   . GERD (gastroesophageal reflux disease)    04/07/2019- not current  . Hyperlipidemia   . Hypertension    Dr. [name] [phone]  . Ischemic cardiomyopathy    a. LV-gram at time of LHC in 6/17 with EF 35-45%  //  b. Echo 7/17: EF 45-50%, inferior HK, grade 1 diastolic dysfunction, mildly dilated aortic root, moderately reduced RVSF, mild RAE  . Myocardial infarction ([place]) 2017  . 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 Numbness and tingling of both lower extremities    "on the outside  of both sides" (02/18/2017)  . Rheumatoid arthritis ([facility])    RA  . Tachycardia   . Type II diabetes mellitus ([facility])    diet controlled    Past Surgical History:  Procedure Laterality Date  . BACK SURGERY     x3  . CARDIAC CATHETERIZATION N/A 07/15/2015   Procedure: Left Heart Cath and Coronary Angiography;  Surgeon: [name], MD;  Location: [facility];  Service: Cardiovascular;  Laterality: N/A;  . CARDIAC CATHETERIZATION N/A 07/15/2015   Procedure: Coronary Stent Intervention;  Surgeon: [name], MD;  Location: [facility];  Service: Cardiovascular;  Laterality: N/A;  . CARDIAC CATHETERIZATION N/A 08/07/2015   Procedure: Left Heart Cath and Coronary Angiography;  Surgeon: [name], MD;  Location: [facility];  Service: Cardiovascular;  Laterality: N/A;  . CORONARY ANGIOPLASTY WITH STENT PLACEMENT  07/2015  . LEFT HEART CATH AND CORONARY ANGIOGRAPHY N/A 02/19/2017   Procedure: LEFT HEART CATH AND CORONARY ANGIOGRAPHY;  Surgeon: Martinique, Peter M, MD;  Location: [facility];  Service: Cardiovascular;  Laterality: N/A;  . LUMBAR FUSION  04/11/2019  REVISION OF THORACOLUMBAR FUSION   . LUMBAR SPINE SURGERY  03/2009  & 2012  . MASS EXCISION N/A 04/19/2012   Procedure: removal of posterior cervical lipoma;  Surgeon: [name], MD;  Location: [facility];  Service: Neurosurgery;  Laterality: N/A;  Removal of posterior cervical lipoma  . POPLITEAL SYNOVIAL CYST EXCISION Left    "opened up behind my knee; scraped out arthritis"  . POSTERIOR LUMBAR FUSION 4 LEVEL N/A 11/22/2018   Procedure: Posterior Lateral and Interbody fusion - Lumbar one-Lumbar two; explore fusion, posterior instrumentation and fusion Thoracic ten to the ilium;  Surgeon: Newman Pies, MD;  Location: [facility];  Service: Neurosurgery;  Laterality: N/A;  . SPINAL CORD STIMULATOR INSERTION N/A 01/07/2013   Procedure:  SPINAL CORD STIMULATOR INSERTION;  Surgeon: [name], MD;   Location: [facility];  Service: Neurosurgery;  Laterality: N/A;  . SPINAL CORD STIMULATOR INSERTION N/A 02/09/2019   Procedure: REPLACEMENT OF LUMBAR SPINAL CORD STIMULATOR;  Surgeon: Newman Pies, MD;  Location: [facility];  Service: Neurosurgery;  Laterality: N/A;  Thoracic/Lumbar spine  . TONSILLECTOMY     patient denies  . TOTAL HIP ARTHROPLASTY Right 10/03/2016   Procedure: TOTAL HIP ARTHROPLASTY;  Surgeon: [name], MD;  Location: [facility];  Service: Orthopedics;  Laterality: Right;  . WISDOM TOOTH EXTRACTION     hx of    There were no vitals filed for this visit.  Subjective Assessment - 05/26/19 1447    Subjective  Reports feeling tired as he gets up at 5am.  Got last round of gel shots in knees today.    Pertinent History  NSTEMI, HTN, HLD, chronic back pain with multiple surgeries and spinal cord stimulator, CAD, ischemic cardiomyopathy with coronary angioplasty with stent placement, L ventricular aneurysm, type 2 DM, obesity, R hip OA with R THA    Limitations  Standing;Walking;House hold activities    How long can you sit comfortably?  1 hour    How long can you stand comfortably?  10 minutes    How long can you walk comfortably?  short distances in my home    Patient Stated Goals  To walk without the RW    Currently in Pain?  Yes    Pain Score  4     Pain Location  Back    Pain Orientation  Lower    Pain Descriptors / Indicators  Aching    Pain Onset  More than a month ago    Pain Frequency  Intermittent    Aggravating Factors   walking long periods of time    Pain Relieving Factors  rest         [facility] PT Assessment - 05/26/19 1457      Transfers   Transfers  Sit to Stand;Stand to Sit;Stand Pivot Transfers    Sit to Stand  6: Modified independent (Device/Increase time)    Five time sit to stand comments   8.22 secs without UE support    Stand to Sit  6: Modified independent (Device/Increase time)      Ambulation/Gait   Gait velocity  2.89 ft/sec with RW     Stairs  Yes    Stairs Assistance  6: Modified independent (Device/Increase time)    Stair Management Technique  One rail Right;Alternating pattern;Forwards    Number of Stairs  4    Height of Stairs  6      6 Minute Walk- Baseline   6 Minute Walk- Baseline  yes  BP (mmHg)  137/86    HR (bpm)  91    02 Sat (%RA)  98 %    Modified Borg Scale for Dyspnea  0- Nothing at all    Perceived Rate of Exertion (Borg)  6-      6 Minute walk- Post Test   6 Minute Walk Post Test  yes    BP (mmHg)  139/82    HR (bpm)  111    02 Sat (%RA)  96 %    Modified Borg Scale for Dyspnea  1- Very mild shortness of breath    Perceived Rate of Exertion (Borg)  12-      6 minute walk test results    Aerobic Endurance Distance Walked  383    Endurance additional comments  2 minute walk test with RW and no rest breaks      Balance   Balance Assessed  Yes      Standardized Balance Assessment   Standardized Balance Assessment  Dynamic Gait Index      Dynamic Gait Index   Level Surface  Mild Impairment    Change in Gait Speed  Mild Impairment    Gait with Horizontal Head Turns  Moderate Impairment    Gait with Vertical Head Turns  Mild Impairment    Gait and Pivot Turn  Mild Impairment    Step Over Obstacle  Mild Impairment    Step Around Obstacles  Mild Impairment    Steps  Mild Impairment    Total Score  15                   [facility] Adult PT Treatment/Exercise - 05/26/19 1527      Lumbar Exercises: Aerobic   Other Aerobic Exercise  Scitfit stepper with  BLEs only at level 3.5 resistance x 4 mins (2 mins without UEs, 2 mins with UEs due to fatigue) for generalized strengthening and endurance.  Pt moderately fatigued.               PT Education - 05/26/19 1707    Education Details  progress towards LTGs and need to update LTGs    Person(s) Educated  Patient    Methods  Explanation    Comprehension  Verbalized understanding       PT Short Term Goals - 05/26/19 1449       PT SHORT TERM GOAL #1   Title  Pt will participate in further assessment of falls risk and endurance with DGI and 2 minute walk test of endurance    Baseline  DGI 9/24 and 2 minute walk test = 201 feet both performed with 2WRW to 38' with RW on 2MWT and 15/24 without AD on 05/26/19    Time  4    Period  Weeks    Status  Achieved    Target Date  05/26/19      PT SHORT TERM GOAL #2   Title  Pt will demonstrate independence with initial HEP    Baseline  met except for corner balance exercises added last visit.    Time  4    Period  Weeks    Status  Partially Met    Target Date  05/26/19      PT SHORT TERM GOAL #3   Title  Pt will demonstrate improvement in LE strength as indicated by improvement in five time sit to stand from chair with use of UE to </= 28 seconds    Baseline  32.94 to 8.22 secs without UE support from standard arm chair    Time  4    Period  Weeks    Status  Achieved    Target Date  05/26/19      PT SHORT TERM GOAL #4   Title  Pt will improve gait velocity with RW to >/= 1.8 ft/sec    Baseline  1.12 ft/sec to 2.89 ft/sec with RW    Time  4    Period  Weeks    Status  Achieved    Target Date  05/26/19      PT SHORT TERM GOAL #5   Title  Pt will negotiate 4 stairs with 2 rails, alternating sequence Mod I    Baseline  met with single rail on 05/26/19    Time  4    Period  Weeks    Status  Achieved    Target Date  05/26/19        PT Long Term Goals - 05/26/19 1529      PT LONG TERM GOAL #1   Title  Pt will be independent with final HEP    Time  8    Period  Weeks    Status  New      PT LONG TERM GOAL #2   Title  Pt will participate in 6MWT and goal distance to be established.    Time  8    Period  Weeks    Status  New      PT LONG TERM GOAL #3   Title  Pt will improve gait speed to >/= 2.62 ft/sec with SPC.    Time  8    Period  Weeks    Status  Revised      PT LONG TERM GOAL #4   Title  Pt will improve DGI to >/=18/24 without AD to indicate  decreased falls when ambulating in the community    Baseline  --    Time  8    Period  Weeks    Status  New      PT LONG TERM GOAL #5   Title  Pt will ambulate 500' over unlevel outdoor paved surfaces w/ SPC at mod I level in order to indicate improved community mobility.    Baseline  --    Time  8    Period  Weeks    Status  New      PT LONG TERM GOAL #6   Title  --    Time  --    Period  --    Status  --            Plan - 05/26/19 1708    Clinical Impression Statement  Skilled session focused on assessment of STGs.  Pt has met all but HEP goal as he has not performed corner balance exercises issued at last session.  Continues to be a high fall risk and recommend RW both for safety and improved gait quality.  Pt verbalized understanding.    Personal Factors and Comorbidities  Comorbidity 3+;Fitness;Past/Current Experience    Comorbidities  NSTEMI, HTN, HLD, chronic back pain with multiple surgeries and spinal cord stimulator, CAD, ischemic cardiomyopathy with coronary angioplasty with stent placement, L ventricular aneurysm, type 2 DM, obesity, R hip OA with R THA    Examination-Activity Limitations  Bed Mobility;Bend;Dressing;Lift;Locomotion Level;Stairs;Stand    Examination-Participation Restrictions  Cleaning;Community Activity    Stability/Clinical Decision Making  Evolving/Moderate complexity    Rehab  Potential  Good    PT Frequency  2x / week    PT Duration  8 weeks    PT Treatment/Interventions  ADLs/Self Care Home Management;Aquatic Therapy;Cryotherapy;Moist Heat;DME Instruction;Gait training;Stair training;Functional mobility training;Therapeutic activities;Therapeutic exercise;Balance training;Neuromuscular re-education;Patient/family education;Orthotic Fit/Training;Scar mobilization;Passive range of motion;Dry needling;Taping;Manual techniques    PT Next Visit Plan  6MWT, update goal, continue balance, compliant surfaces, EC, gait with SPC as able,  PATIENT WEARS  TLSO, BACK PRECAUTIONS. add seated hamstring stretch to HEP, (and more) isometric strengthening; initiate standing balance and endurance activities, R hip strengthening    PT Home Exercise Plan  [id number]    Consulted and Agree with Plan of Care  Patient       Patient will benefit from skilled therapeutic intervention in order to improve the following deficits and impairments:  Decreased activity tolerance, Decreased balance, Decreased endurance, Decreased mobility, Decreased range of motion, Decreased strength, Difficulty walking, Increased edema, Impaired sensation, Postural dysfunction, Pain  Visit Diagnosis: Muscle weakness (generalized)  Abnormal posture  Chronic bilateral low back pain, unspecified whether sciatica present  Difficulty in walking, not elsewhere classified  Repeated falls     Problem List Patient Active Problem List   Diagnosis Date Noted  . Chronic low back pain 02/09/2019  . Spinal stenosis of lumbar region with neurogenic claudication 11/22/2018  . Radiculopathy of lumbar region 05/31/2017  . HCAP (healthcare-associated pneumonia) 05/30/2017  . Leukocytosis 05/30/2017  . Lumbar pseudoarthrosis 05/27/2017  . Unstable angina ([facility]) 02/18/2017  . Primary localized osteoarthritis of right hip 10/03/2016  . Chest pain, rule out acute myocardial infarction 05/01/2016  . Diabetes mellitus type 2, controlled, with complications ([place]) 32/35/5732  . Obesity (BMI 30-39.9) 05/01/2016  . Left ventricular aneurysm 12/03/2015  . Ischemic cardiomyopathy   . CAD (coronary artery disease) 07/16/2015  . Hypokalemia 07/16/2015  . History of non-ST elevation myocardial infarction (NSTEMI) 07/15/2015  . Hypertension 07/15/2015  . Hyperlipidemia with target LDL less than 100 07/15/2015  . Back pain 07/15/2015  . GERD (gastroesophageal reflux disease) 06/18/2012  . Chronic radicular low back pain 06/18/2012    Cameron Sprang, PT, MPT [facility] [address] [place], [place], [address] Phone: [phone]   Fax:  [phone] 05/26/19, 5:13 PM  Name: Thomas Mcclure MRN: [id number] Date of Birth: February 18, 1956

## 2019-05-30 ENCOUNTER — Ambulatory Visit: Payer: Medicare Other | Admitting: Rehabilitation

## 2019-05-30 ENCOUNTER — Other Ambulatory Visit: Payer: Self-pay

## 2019-05-30 ENCOUNTER — Encounter: Payer: Self-pay | Admitting: Rehabilitation

## 2019-05-30 DIAGNOSIS — R293 Abnormal posture: Secondary | ICD-10-CM

## 2019-05-30 DIAGNOSIS — M545 Low back pain, unspecified: Secondary | ICD-10-CM

## 2019-05-30 DIAGNOSIS — M6281 Muscle weakness (generalized): Secondary | ICD-10-CM

## 2019-05-30 DIAGNOSIS — R262 Difficulty in walking, not elsewhere classified: Secondary | ICD-10-CM

## 2019-05-30 DIAGNOSIS — G8929 Other chronic pain: Secondary | ICD-10-CM

## 2019-05-30 DIAGNOSIS — R296 Repeated falls: Secondary | ICD-10-CM

## 2019-05-30 NOTE — Patient Instructions (Signed)
Access Code: [email or web address] URL: [email or web address] Date: 05/30/2019 Prepared by: Cameron Sprang  Exercises Standing Hip Extension with Counter Support - 1 x daily - 7 x weekly - 1 sets - 10 reps Side Stepping with Resistance at Thighs - 1 x daily - 7 x weekly - 1 sets - 4 reps Walking March - 1 x daily - 7 x weekly - 1 sets - 4 reps Romberg Stance on Foam Pad - 1 x daily - 7 x weekly - 1 sets - 3 reps - 20 secs hold Romberg Stance Eyes Closed on Foam Pad - 1 x daily - 7 x weekly - 1 sets - 3 reps - 20 secs hold Clamshell with Resistance - 1 x daily - 7 x weekly - 1 sets - 10 reps

## 2019-05-30 NOTE — Therapy (Signed)
[facility] [address] [place], [place], [address] Phone: [phone]   Fax:  [phone]  Physical Therapy Treatment  Patient Details  Name: Thomas Mcclure MRN: [id number] Date of Birth: 1955-07-20 Referring Provider (PT): Newman Pies, MD   Encounter Date: 05/30/2019  PT End of Session - 05/30/19 1302    Visit Number  8    Number of Visits  17    Date for PT Re-Evaluation  06/25/19    Authorization Type  [employer] - [place] - 10th visit PN.  $0 copay as long as Medicaid is active.  VL: MN    PT Start Time  1103    PT Stop Time  1146    PT Time Calculation (min)  43 min    Equipment Utilized During Treatment  Back brace    Activity Tolerance  Patient tolerated treatment well    Behavior During Therapy  WFL for tasks assessed/performed       Past Medical History:  Diagnosis Date  . Baker's cyst    Left calf  . CAD in native artery, 07/15/15 PCI of RCA with DES 07/16/2015   a.   NSTEMI 5/17: LHC - pLAD 20, pLCx 20, OM1 30, pRCA 100, EF 45-50%>> PCI: 2.5 x 24 mm Promus DES to RCA  //  b.   Echo 5/17: mild LVH, EF 50-55%, no RWMA, mod RVE //  c. LHC 6/17: pLAD 20, pLCx 20, OM1 50, pRCA stent ok, EF 35-45% with mod sized inf wall and basal segment aneurysm  . CHF (congestive heart failure) ([place])   . Chronic back pain    "down my back, down my legs" (02/18/2017)  . Constipation   . Constipation due to opioid therapy   . GERD (gastroesophageal reflux disease)    04/07/2019- not current  . Hyperlipidemia   . Hypertension    Dr. [name] [phone]  . Ischemic cardiomyopathy    a. LV-gram at time of LHC in 6/17 with EF 35-45%  //  b. Echo 7/17: EF 45-50%, inferior HK, grade 1 diastolic dysfunction, mildly dilated aortic root, moderately reduced RVSF, mild RAE  . Myocardial infarction ([place]) 2017  . 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 Numbness and tingling of both lower extremities    "on the outside  of both sides" (02/18/2017)  . Rheumatoid arthritis ([facility])    RA  . Tachycardia   . Type II diabetes mellitus ([place])    diet controlled    Past Surgical History:  Procedure Laterality Date  . BACK SURGERY     x3  . CARDIAC CATHETERIZATION N/A 07/15/2015   Procedure: Left Heart Cath and Coronary Angiography;  Surgeon: [name], MD;  Location: [facility];  Service: Cardiovascular;  Laterality: N/A;  . CARDIAC CATHETERIZATION N/A 07/15/2015   Procedure: Coronary Stent Intervention;  Surgeon: [name], MD;  Location: [facility];  Service: Cardiovascular;  Laterality: N/A;  . CARDIAC CATHETERIZATION N/A 08/07/2015   Procedure: Left Heart Cath and Coronary Angiography;  Surgeon: [name], MD;  Location: [facility];  Service: Cardiovascular;  Laterality: N/A;  . CORONARY ANGIOPLASTY WITH STENT PLACEMENT  07/2015  . LEFT HEART CATH AND CORONARY ANGIOGRAPHY N/A 02/19/2017   Procedure: LEFT HEART CATH AND CORONARY ANGIOGRAPHY;  Surgeon: Martinique, Peter M, MD;  Location: [facility];  Service: Cardiovascular;  Laterality: N/A;  . LUMBAR FUSION  04/11/2019  REVISION OF THORACOLUMBAR FUSION   . LUMBAR SPINE SURGERY  03/2009  & 2012  . MASS EXCISION N/A 04/19/2012   Procedure: removal of posterior cervical lipoma;  Surgeon: [name], MD;  Location: [facility];  Service: Neurosurgery;  Laterality: N/A;  Removal of posterior cervical lipoma  . POPLITEAL SYNOVIAL CYST EXCISION Left    "opened up behind my knee; scraped out arthritis"  . POSTERIOR LUMBAR FUSION 4 LEVEL N/A 11/22/2018   Procedure: Posterior Lateral and Interbody fusion - Lumbar one-Lumbar two; explore fusion, posterior instrumentation and fusion Thoracic ten to the ilium;  Surgeon: Newman Pies, MD;  Location: [facility];  Service: Neurosurgery;  Laterality: N/A;  . SPINAL CORD STIMULATOR INSERTION N/A 01/07/2013   Procedure:  SPINAL CORD STIMULATOR INSERTION;  Surgeon: [name], MD;   Location: [facility];  Service: Neurosurgery;  Laterality: N/A;  . SPINAL CORD STIMULATOR INSERTION N/A 02/09/2019   Procedure: REPLACEMENT OF LUMBAR SPINAL CORD STIMULATOR;  Surgeon: Newman Pies, MD;  Location: [facility];  Service: Neurosurgery;  Laterality: N/A;  Thoracic/Lumbar spine  . TONSILLECTOMY     patient denies  . TOTAL HIP ARTHROPLASTY Right 10/03/2016   Procedure: TOTAL HIP ARTHROPLASTY;  Surgeon: [name], MD;  Location: [facility];  Service: Orthopedics;  Laterality: Right;  . WISDOM TOOTH EXTRACTION     hx of    There were no vitals filed for this visit.  Subjective Assessment - 05/30/19 1106    Subjective  Reports feeling "hungover" from weekend.    Pertinent History  NSTEMI, HTN, HLD, chronic back pain with multiple surgeries and spinal cord stimulator, CAD, ischemic cardiomyopathy with coronary angioplasty with stent placement, L ventricular aneurysm, type 2 DM, obesity, R hip OA with R THA    Limitations  Standing;Walking;House hold activities    How long can you sit comfortably?  1 hour    How long can you stand comfortably?  10 minutes    How long can you walk comfortably?  short distances in my home    Patient Stated Goals  To walk without the RW    Currently in Pain?  Yes    Pain Score  5     Pain Location  Back    Pain Orientation  Lower    Pain Descriptors / Indicators  Tiring    Pain Onset  More than a month ago    Pain Frequency  Intermittent    Aggravating Factors   increased activity    Pain Relieving Factors  rest         [facility] PT Assessment - 05/30/19 1112      6 Minute Walk- Baseline   6 Minute Walk- Baseline  yes    BP (mmHg)  120/83    HR (bpm)  108    02 Sat (%RA)  96 %    Modified Borg Scale for Dyspnea  0- Nothing at all    Perceived Rate of Exertion (Borg)  6-      6 Minute walk- Post Test   6 Minute Walk Post Test  yes    BP (mmHg)  131/86    HR (bpm)  122    02 Sat (%RA)  92 %    Modified Borg Scale for Dyspnea  2- Mild  shortness of breath    Perceived Rate of Exertion (Borg)  13- Somewhat hard      6 minute walk test results    Aerobic Endurance Distance Walked  843  Endurance additional comments  with RW with 2 standing rest breaks no more than 20 secs each                    [facility] Adult PT Treatment/Exercise - 05/30/19 0001      Self-Care   Self-Care  Other Self-Care Comments    Other Self-Care Comments   Discussed walking program at home as 6 mins during 6MWT was very difficult.  Recommend that he begin with 3-4 mins consecutively and do 2-3 times per day if able.  Then increase by 1 min weekly as long as tolerating well to work up to 10 mins.  Recommend he continue to do this with RW at this time.  Pt verbalized understanding.       Exercises   Other Exercises   closed chain R hip abd on step with BUE support x 20 reps with heavy demo and verbal cues for technique but he did finally do correctly.  SL clam abduction with green theraband x 15 reps, straight leg SL R hip abd x 10 reps, however pt has difficulty keeping RLE in abd vs flex, therefore added clam abd to HEP.               PT Education - 05/30/19 1302    Education Details  See self care    Person(s) Educated  Patient    Methods  Explanation    Comprehension  Verbalized understanding       PT Short Term Goals - 05/26/19 1449      PT SHORT TERM GOAL #1   Title  Pt will participate in further assessment of falls risk and endurance with DGI and 2 minute walk test of endurance    Baseline  DGI 9/24 and 2 minute walk test = 201 feet both performed with 2WRW to 51' with RW on 2MWT and 15/24 without AD on 05/26/19    Time  4    Period  Weeks    Status  Achieved    Target Date  05/26/19      PT SHORT TERM GOAL #2   Title  Pt will demonstrate independence with initial HEP    Baseline  met except for corner balance exercises added last visit.    Time  4    Period  Weeks    Status  Partially Met    Target Date   05/26/19      PT SHORT TERM GOAL #3   Title  Pt will demonstrate improvement in LE strength as indicated by improvement in five time sit to stand from chair with use of UE to </= 28 seconds    Baseline  32.94 to 8.22 secs without UE support from standard arm chair    Time  4    Period  Weeks    Status  Achieved    Target Date  05/26/19      PT SHORT TERM GOAL #4   Title  Pt will improve gait velocity with RW to >/= 1.8 ft/sec    Baseline  1.12 ft/sec to 2.89 ft/sec with RW    Time  4    Period  Weeks    Status  Achieved    Target Date  05/26/19      PT SHORT TERM GOAL #5   Title  Pt will negotiate 4 stairs with 2 rails, alternating sequence Mod I    Baseline  met with single rail on 05/26/19  Time  4    Period  Weeks    Status  Achieved    Target Date  05/26/19        PT Long Term Goals - 05/30/19 1305      PT LONG TERM GOAL #1   Title  Pt will be independent with final HEP    Time  8    Period  Weeks    Status  New      PT LONG TERM GOAL #2   Title  Pt will improve 6MWT distance to >/=993' with LRAD in order to indicate improved endurance.    Time  8    Period  Weeks    Status  New      PT LONG TERM GOAL #3   Title  Pt will improve gait speed to >/= 2.62 ft/sec with SPC.    Time  8    Period  Weeks    Status  Revised      PT LONG TERM GOAL #4   Title  Pt will improve DGI to >/=18/24 without AD to indicate decreased falls when ambulating in the community    Time  8    Period  Weeks    Status  New      PT LONG TERM GOAL #5   Title  Pt will ambulate 500' over unlevel outdoor paved surfaces w/ SPC at mod I level in order to indicate improved community mobility.    Time  8    Period  Weeks    Status  New            Plan - 05/30/19 1303    Clinical Impression Statement  Skilled session focused on assessment of endurance with 6MWT. Pt had distance of 843', which is well under normal distance of his age group.  Initiated walking program today with pt with  goal of ambulating 2-3 times per day at 3-4 mins to begin with and increasing from there.  Also continue to address R hip weakness with therex.    Personal Factors and Comorbidities  Comorbidity 3+;Fitness;Past/Current Experience    Comorbidities  NSTEMI, HTN, HLD, chronic back pain with multiple surgeries and spinal cord stimulator, CAD, ischemic cardiomyopathy with coronary angioplasty with stent placement, L ventricular aneurysm, type 2 DM, obesity, R hip OA with R THA    Examination-Activity Limitations  Bed Mobility;Bend;Dressing;Lift;Locomotion Level;Stairs;Stand    Examination-Participation Restrictions  Cleaning;Community Activity    Stability/Clinical Decision Making  Evolving/Moderate complexity    Rehab Potential  Good    PT Frequency  2x / week    PT Duration  8 weeks    PT Treatment/Interventions  ADLs/Self Care Home Management;Aquatic Therapy;Cryotherapy;Moist Heat;DME Instruction;Gait training;Stair training;Functional mobility training;Therapeutic activities;Therapeutic exercise;Balance training;Neuromuscular re-education;Patient/family education;Orthotic Fit/Training;Scar mobilization;Passive range of motion;Dry needling;Taping;Manual techniques    PT Next Visit Plan  hip abd strength, continue balance, compliant surfaces, EC, gait with SPC as able,  PATIENT WEARS TLSO, BACK PRECAUTIONS. add seated hamstring stretch to HEP, (and more) isometric strengthening; initiate standing balance and endurance activities, R hip strengthening    PT Home Exercise Plan  [id number]    Consulted and Agree with Plan of Care  Patient       Patient will benefit from skilled therapeutic intervention in order to improve the following deficits and impairments:  Decreased activity tolerance, Decreased balance, Decreased endurance, Decreased mobility, Decreased range of motion, Decreased strength, Difficulty walking, Increased edema, Impaired sensation, Postural dysfunction, Pain  Visit Diagnosis: Muscle  weakness (  generalized)  Abnormal posture  Chronic bilateral low back pain, unspecified whether sciatica present  Difficulty in walking, not elsewhere classified  Repeated falls     Problem List Patient Active Problem List   Diagnosis Date Noted  . Chronic low back pain 02/09/2019  . Spinal stenosis of lumbar region with neurogenic claudication 11/22/2018  . Radiculopathy of lumbar region 05/31/2017  . HCAP (healthcare-associated pneumonia) 05/30/2017  . Leukocytosis 05/30/2017  . Lumbar pseudoarthrosis 05/27/2017  . Unstable angina ([place]) 02/18/2017  . Primary localized osteoarthritis of right hip 10/03/2016  . Chest pain, rule out acute myocardial infarction 05/01/2016  . Diabetes mellitus type 2, controlled, with complications ([address]) 16/11/9602  . Obesity (BMI 30-39.9) 05/01/2016  . Left ventricular aneurysm 12/03/2015  . Ischemic cardiomyopathy   . CAD (coronary artery disease) 07/16/2015  . Hypokalemia 07/16/2015  . History of non-ST elevation myocardial infarction (NSTEMI) 07/15/2015  . Hypertension 07/15/2015  . Hyperlipidemia with target LDL less than 100 07/15/2015  . Back pain 07/15/2015  . GERD (gastroesophageal reflux disease) 06/18/2012  . Chronic radicular low back pain 06/18/2012   Cameron Sprang, PT, MPT [facility] [address] [place], [place], [address] Phone: [phone]   Fax:  [phone] 05/30/19, 1:07 PM  Name: Thomas Mcclure MRN: [id number] Date of Birth: July 30, 1955

## 2019-06-03 ENCOUNTER — Other Ambulatory Visit: Payer: Self-pay

## 2019-06-03 ENCOUNTER — Encounter: Payer: Self-pay | Admitting: Rehabilitation

## 2019-06-03 ENCOUNTER — Ambulatory Visit: Payer: Medicare Other | Admitting: Rehabilitation

## 2019-06-03 DIAGNOSIS — M6281 Muscle weakness (generalized): Secondary | ICD-10-CM

## 2019-06-03 DIAGNOSIS — R296 Repeated falls: Secondary | ICD-10-CM

## 2019-06-03 DIAGNOSIS — R293 Abnormal posture: Secondary | ICD-10-CM

## 2019-06-03 DIAGNOSIS — R262 Difficulty in walking, not elsewhere classified: Secondary | ICD-10-CM

## 2019-06-03 DIAGNOSIS — G8929 Other chronic pain: Secondary | ICD-10-CM

## 2019-06-03 DIAGNOSIS — M545 Low back pain, unspecified: Secondary | ICD-10-CM

## 2019-06-03 DIAGNOSIS — R208 Other disturbances of skin sensation: Secondary | ICD-10-CM

## 2019-06-03 NOTE — Therapy (Signed)
[facility] [address] [place], [place], [address] Phone: [phone]   Fax:  [phone]  Physical Therapy Treatment  Patient Details  Name: Thomas Mcclure MRN: [id number] Date of Birth: 03-21-1955 Referring Provider (PT): Newman Pies, MD   Encounter Date: 06/03/2019  PT End of Session - 06/03/19 1342    Visit Number  9    Number of Visits  17    Date for PT Re-Evaluation  06/25/19    Authorization Type  [facility] - 10th visit PN.  $0 copay as long as Medicaid is active.  VL: MN    PT Start Time  1103    PT Stop Time  1145    PT Time Calculation (min)  42 min    Equipment Utilized During Treatment  Back brace    Activity Tolerance  Patient tolerated treatment well    Behavior During Therapy  WFL for tasks assessed/performed       Past Medical History:  Diagnosis Date  . Baker's cyst    Left calf  . CAD in native artery, 07/15/15 PCI of RCA with DES 07/16/2015   a.   NSTEMI 5/17: LHC - pLAD 20, pLCx 20, OM1 30, pRCA 100, EF 45-50%>> PCI: 2.5 x 24 mm Promus DES to RCA  //  b.   Echo 5/17: mild LVH, EF 50-55%, no RWMA, mod RVE //  c. LHC 6/17: pLAD 20, pLCx 20, OM1 50, pRCA stent ok, EF 35-45% with mod sized inf wall and basal segment aneurysm  . CHF (congestive heart failure) ([place])   . Chronic back pain    "down my back, down my legs" (02/18/2017)  . Constipation   . Constipation due to opioid therapy   . GERD (gastroesophageal reflux disease)    04/07/2019- not current  . Hyperlipidemia   . Hypertension    Dr. [name] [phone]  . Ischemic cardiomyopathy    a. LV-gram at time of LHC in 6/17 with EF 35-45%  //  b. Echo 7/17: EF 45-50%, inferior HK, grade 1 diastolic dysfunction, mildly dilated aortic root, moderately reduced RVSF, mild RAE  . Myocardial infarction ([facility] [place]) 2017  . 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 Numbness and tingling of both lower extremities    "on the outside  of both sides" (02/18/2017)  . Rheumatoid arthritis ([facility])    RA  . Tachycardia   . Type II diabetes mellitus ([facility])    diet controlled    Past Surgical History:  Procedure Laterality Date  . BACK SURGERY     x3  . CARDIAC CATHETERIZATION N/A 07/15/2015   Procedure: Left Heart Cath and Coronary Angiography;  Surgeon: [name], MD;  Location: [facility];  Service: Cardiovascular;  Laterality: N/A;  . CARDIAC CATHETERIZATION N/A 07/15/2015   Procedure: Coronary Stent Intervention;  Surgeon: [name], MD;  Location: [facility];  Service: Cardiovascular;  Laterality: N/A;  . CARDIAC CATHETERIZATION N/A 08/07/2015   Procedure: Left Heart Cath and Coronary Angiography;  Surgeon: [name], MD;  Location: [facility];  Service: Cardiovascular;  Laterality: N/A;  . CORONARY ANGIOPLASTY WITH STENT PLACEMENT  07/2015  . LEFT HEART CATH AND CORONARY ANGIOGRAPHY N/A 02/19/2017   Procedure: LEFT HEART CATH AND CORONARY ANGIOGRAPHY;  Surgeon: Martinique, Peter M, MD;  Location: [facility];  Service: Cardiovascular;  Laterality: N/A;  . LUMBAR FUSION  04/11/2019  REVISION OF THORACOLUMBAR FUSION   . LUMBAR SPINE SURGERY  03/2009  & 2012  . MASS EXCISION N/A 04/19/2012   Procedure: removal of posterior cervical lipoma;  Surgeon: [name], MD;  Location: [facility];  Service: Neurosurgery;  Laterality: N/A;  Removal of posterior cervical lipoma  . POPLITEAL SYNOVIAL CYST EXCISION Left    "opened up behind my knee; scraped out arthritis"  . POSTERIOR LUMBAR FUSION 4 LEVEL N/A 11/22/2018   Procedure: Posterior Lateral and Interbody fusion - Lumbar one-Lumbar two; explore fusion, posterior instrumentation and fusion Thoracic ten to the ilium;  Surgeon: Newman Pies, MD;  Location: [facility];  Service: Neurosurgery;  Laterality: N/A;  . SPINAL CORD STIMULATOR INSERTION N/A 01/07/2013   Procedure:  SPINAL CORD STIMULATOR INSERTION;  Surgeon: [name], MD;   Location: [facility];  Service: Neurosurgery;  Laterality: N/A;  . SPINAL CORD STIMULATOR INSERTION N/A 02/09/2019   Procedure: REPLACEMENT OF LUMBAR SPINAL CORD STIMULATOR;  Surgeon: Newman Pies, MD;  Location: [facility];  Service: Neurosurgery;  Laterality: N/A;  Thoracic/Lumbar spine  . TONSILLECTOMY     patient denies  . TOTAL HIP ARTHROPLASTY Right 10/03/2016   Procedure: TOTAL HIP ARTHROPLASTY;  Surgeon: [name], MD;  Location: [facility];  Service: Orthopedics;  Laterality: Right;  . WISDOM TOOTH EXTRACTION     hx of    There were no vitals filed for this visit.  Subjective Assessment - 06/03/19 1110    Subjective  Feeling really good today.    Pertinent History  NSTEMI, HTN, HLD, chronic back pain with multiple surgeries and spinal cord stimulator, CAD, ischemic cardiomyopathy with coronary angioplasty with stent placement, L ventricular aneurysm, type 2 DM, obesity, R hip OA with R THA    Limitations  Standing;Walking;House hold activities    Patient Stated Goals  To walk without the RW    Currently in Pain?  Yes    Pain Score  3     Pain Location  Back    Pain Orientation  Lower    Pain Descriptors / Indicators  Tiring    Pain Type  Surgical pain    Pain Frequency  Intermittent    Aggravating Factors   increased activity    Pain Relieving Factors  rest                       [facility] Adult PT Treatment/Exercise - 06/03/19 1111      Ambulation/Gait   Ambulation/Gait  Yes    Ambulation/Gait Assistance  5: Supervision    Ambulation/Gait Assistance Details  Gait into treatment area with RW with cues for upright posture and closeness to RW in order to maintain that posture.      Ambulation Distance (Feet)  100 Feet    Assistive device  Rolling walker    Gait Pattern  Step-through pattern;Decreased stride length;Decreased hip/knee flexion - right;Decreased hip/knee flexion - left;Decreased dorsiflexion - right;Decreased dorsiflexion - left;Trunk flexed;Poor  foot clearance - left;Poor foot clearance - right    Ambulation Surface  Level;Indoor      Self-Care   Self-Care  Other Self-Care Comments    Other Self-Care Comments   Continue to encourage walking program.  Pt has been keeping track of number of laps he is doing at home.  Encouraged him to increase distance slightly weekly as able.       Neuro Re-ed    Neuro Re-ed Details   High level balance on foam  balance beam perpendicularly maintaining balance x 20 secs>alternating arm raises x 20 reps>alternating cone taps with single UE support x 10 reps>no UE support with min/guard x 5 reps with cues for R hip activation in R SLS.  Side stepping squat while in foam beam x 4 reps down and back, no UE support       Exercises   Other Exercises   Sit<>stand from lowest setting hi/lo mat x 10 reps without UE support moving arms out into flex to extension with emphasis on glute and back extension activation in full standing.        Lumbar Exercises: Aerobic   Other Aerobic Exercise  SciFit stepper x 8 mins at level 3 resistance with BLEs.  Pt tolerated well.                PT Short Term Goals - 05/26/19 1449      PT SHORT TERM GOAL #1   Title  Pt will participate in further assessment of falls risk and endurance with DGI and 2 minute walk test of endurance    Baseline  DGI 9/24 and 2 minute walk test = 201 feet both performed with 2WRW to 24' with RW on 2MWT and 15/24 without AD on 05/26/19    Time  4    Period  Weeks    Status  Achieved    Target Date  05/26/19      PT SHORT TERM GOAL #2   Title  Pt will demonstrate independence with initial HEP    Baseline  met except for corner balance exercises added last visit.    Time  4    Period  Weeks    Status  Partially Met    Target Date  05/26/19      PT SHORT TERM GOAL #3   Title  Pt will demonstrate improvement in LE strength as indicated by improvement in five time sit to stand from chair with use of UE to </= 28 seconds    Baseline   32.94 to 8.22 secs without UE support from standard arm chair    Time  4    Period  Weeks    Status  Achieved    Target Date  05/26/19      PT SHORT TERM GOAL #4   Title  Pt will improve gait velocity with RW to >/= 1.8 ft/sec    Baseline  1.12 ft/sec to 2.89 ft/sec with RW    Time  4    Period  Weeks    Status  Achieved    Target Date  05/26/19      PT SHORT TERM GOAL #5   Title  Pt will negotiate 4 stairs with 2 rails, alternating sequence Mod I    Baseline  met with single rail on 05/26/19    Time  4    Period  Weeks    Status  Achieved    Target Date  05/26/19        PT Long Term Goals - 05/30/19 1305      PT LONG TERM GOAL #1   Title  Pt will be independent with final HEP    Time  8    Period  Weeks    Status  New      PT LONG TERM GOAL #2   Title  Pt will improve 6MWT distance to >/=993' with LRAD in order to indicate improved endurance.    Time  8  Period  Weeks    Status  New      PT LONG TERM GOAL #3   Title  Pt will improve gait speed to >/= 2.62 ft/sec with SPC.    Time  8    Period  Weeks    Status  Revised      PT LONG TERM GOAL #4   Title  Pt will improve DGI to >/=18/24 without AD to indicate decreased falls when ambulating in the community    Time  8    Period  Weeks    Status  New      PT LONG TERM GOAL #5   Title  Pt will ambulate 500' over unlevel outdoor paved surfaces w/ SPC at mod I level in order to indicate improved community mobility.    Time  8    Period  Weeks    Status  New            Plan - 06/03/19 1342    Clinical Impression Statement  Skilled session continues to focus on generalized strength and endurance with Scifit stepper as well as high level balance on compliant sufaces.  Pt verbalizes compliance with corner balance tasks at home which seems to be translating to improved balance in clinic.  Pt very conversational and needing max cues at times to continue with task.    Personal Factors and Comorbidities   Comorbidity 3+;Fitness;Past/Current Experience    Comorbidities  NSTEMI, HTN, HLD, chronic back pain with multiple surgeries and spinal cord stimulator, CAD, ischemic cardiomyopathy with coronary angioplasty with stent placement, L ventricular aneurysm, type 2 DM, obesity, R hip OA with R THA    Examination-Activity Limitations  Bed Mobility;Bend;Dressing;Lift;Locomotion Level;Stairs;Stand    Examination-Participation Restrictions  Cleaning;Community Activity    Stability/Clinical Decision Making  Evolving/Moderate complexity    Rehab Potential  Good    PT Frequency  2x / week    PT Duration  8 weeks    PT Treatment/Interventions  ADLs/Self Care Home Management;Aquatic Therapy;Cryotherapy;Moist Heat;DME Instruction;Gait training;Stair training;Functional mobility training;Therapeutic activities;Therapeutic exercise;Balance training;Neuromuscular re-education;Patient/family education;Orthotic Fit/Training;Scar mobilization;Passive range of motion;Dry needling;Taping;Manual techniques    PT Next Visit Plan  10th visit PN, hip abd strength, continue balance, compliant surfaces, EC, gait with SPC as able,  PATIENT WEARS TLSO, BACK PRECAUTIONS. add seated hamstring stretch to HEP, (and more) isometric strengthening; initiate standing balance and endurance activities, R hip strengthening    PT Home Exercise Plan  [id number]    Consulted and Agree with Plan of Care  Patient       Patient will benefit from skilled therapeutic intervention in order to improve the following deficits and impairments:  Decreased activity tolerance, Decreased balance, Decreased endurance, Decreased mobility, Decreased range of motion, Decreased strength, Difficulty walking, Increased edema, Impaired sensation, Postural dysfunction, Pain  Visit Diagnosis: Muscle weakness (generalized)  Abnormal posture  Chronic bilateral low back pain, unspecified whether sciatica present  Difficulty in walking, not elsewhere  classified  Repeated falls  Other disturbances of skin sensation     Problem List Patient Active Problem List   Diagnosis Date Noted  . Chronic low back pain 02/09/2019  . Spinal stenosis of lumbar region with neurogenic claudication 11/22/2018  . Radiculopathy of lumbar region 05/31/2017  . HCAP (healthcare-associated pneumonia) 05/30/2017  . Leukocytosis 05/30/2017  . Lumbar pseudoarthrosis 05/27/2017  . Unstable angina ([facility]) 02/18/2017  . Primary localized osteoarthritis of right hip 10/03/2016  . Chest pain, rule out acute myocardial infarction 05/01/2016  . Diabetes  mellitus type 2, controlled, with complications ([place]) 64/38/3818  . Obesity (BMI 30-39.9) 05/01/2016  . Left ventricular aneurysm 12/03/2015  . Ischemic cardiomyopathy   . CAD (coronary artery disease) 07/16/2015  . Hypokalemia 07/16/2015  . History of non-ST elevation myocardial infarction (NSTEMI) 07/15/2015  . Hypertension 07/15/2015  . Hyperlipidemia with target LDL less than 100 07/15/2015  . Back pain 07/15/2015  . GERD (gastroesophageal reflux disease) 06/18/2012  . Chronic radicular low back pain 06/18/2012    Cameron Sprang, PT, MPT [facility] [address] [place], [place], [address] Phone: [phone]   Fax:  [phone] 06/03/19, 1:45 PM  Name: Thomas Mcclure MRN: [id number] Date of Birth: 01/24/1956

## 2019-06-06 ENCOUNTER — Ambulatory Visit: Payer: Medicare Other | Admitting: Rehabilitation

## 2019-06-06 ENCOUNTER — Encounter: Payer: Self-pay | Admitting: Rehabilitation

## 2019-06-06 ENCOUNTER — Other Ambulatory Visit: Payer: Self-pay

## 2019-06-06 DIAGNOSIS — M545 Low back pain, unspecified: Secondary | ICD-10-CM

## 2019-06-06 DIAGNOSIS — M6281 Muscle weakness (generalized): Secondary | ICD-10-CM | POA: Diagnosis not present

## 2019-06-06 DIAGNOSIS — R262 Difficulty in walking, not elsewhere classified: Secondary | ICD-10-CM

## 2019-06-06 DIAGNOSIS — R296 Repeated falls: Secondary | ICD-10-CM

## 2019-06-06 DIAGNOSIS — G8929 Other chronic pain: Secondary | ICD-10-CM

## 2019-06-06 DIAGNOSIS — R293 Abnormal posture: Secondary | ICD-10-CM

## 2019-06-06 NOTE — Therapy (Signed)
[facility] [address] [place], [place], [address] Phone: [phone]   Fax:  [phone]  Physical Therapy Treatment and Progress Note   Patient Details  Name: Thomas Mcclure MRN: [id number] Date of Birth: 1955/11/05 Referring Provider (PT): Newman Pies, MD   Encounter Date: 06/06/2019  PT End of Session - 06/06/19 1106    Visit Number  10    Number of Visits  17    Date for PT Re-Evaluation  06/25/19    Authorization Type  [employer] - [place] - 10th visit PN.  $0 copay as long as Medicaid is active.  VL: MN    PT Start Time  1103    PT Stop Time  1145    PT Time Calculation (min)  42 min    Equipment Utilized During Treatment  Back brace    Activity Tolerance  Patient tolerated treatment well    Behavior During Therapy  WFL for tasks assessed/performed       Past Medical History:  Diagnosis Date  . Baker's cyst    Left calf  . CAD in native artery, 07/15/15 PCI of RCA with DES 07/16/2015   a.   NSTEMI 5/17: LHC - pLAD 20, pLCx 20, OM1 30, pRCA 100, EF 45-50%>> PCI: 2.5 x 24 mm Promus DES to RCA  //  b.   Echo 5/17: mild LVH, EF 50-55%, no RWMA, mod RVE //  c. LHC 6/17: pLAD 20, pLCx 20, OM1 50, pRCA stent ok, EF 35-45% with mod sized inf wall and basal segment aneurysm  . CHF (congestive heart failure) ([facility] [place])   . Chronic back pain    "down my back, down my legs" (02/18/2017)  . Constipation   . Constipation due to opioid therapy   . GERD (gastroesophageal reflux disease)    04/07/2019- not current  . Hyperlipidemia   . Hypertension    Dr. [name] [phone]  . Ischemic cardiomyopathy    a. LV-gram at time of LHC in 6/17 with EF 35-45%  //  b. Echo 7/17: EF 45-50%, inferior HK, grade 1 diastolic dysfunction, mildly dilated aortic root, moderately reduced RVSF, mild RAE  . Myocardial infarction ([facility]) 2017  . 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 Numbness and tingling of both lower extremities     "on the outside of both sides" (02/18/2017)  . Rheumatoid arthritis ([facility])    RA  . Tachycardia   . Type II diabetes mellitus ([facility])    diet controlled    Past Surgical History:  Procedure Laterality Date  . BACK SURGERY     x3  . CARDIAC CATHETERIZATION N/A 07/15/2015   Procedure: Left Heart Cath and Coronary Angiography;  Surgeon: [name], MD;  Location: [facility];  Service: Cardiovascular;  Laterality: N/A;  . CARDIAC CATHETERIZATION N/A 07/15/2015   Procedure: Coronary Stent Intervention;  Surgeon: [name], MD;  Location: [facility];  Service: Cardiovascular;  Laterality: N/A;  . CARDIAC CATHETERIZATION N/A 08/07/2015   Procedure: Left Heart Cath and Coronary Angiography;  Surgeon: [name], MD;  Location: [facility];  Service: Cardiovascular;  Laterality: N/A;  . CORONARY ANGIOPLASTY WITH STENT PLACEMENT  07/2015  . LEFT HEART CATH AND CORONARY ANGIOGRAPHY N/A 02/19/2017   Procedure: LEFT HEART CATH AND CORONARY ANGIOGRAPHY;  Surgeon: Martinique, Peter M, MD;  Location: [facility];  Service: Cardiovascular;  Laterality: N/A;  . LUMBAR FUSION  04/11/2019   REVISION OF THORACOLUMBAR FUSION   . LUMBAR SPINE SURGERY  03/2009  & 2012  . MASS EXCISION N/A 04/19/2012   Procedure: removal of posterior cervical lipoma;  Surgeon: [name], MD;  Location: [facility];  Service: Neurosurgery;  Laterality: N/A;  Removal of posterior cervical lipoma  . POPLITEAL SYNOVIAL CYST EXCISION Left    "opened up behind my knee; scraped out arthritis"  . POSTERIOR LUMBAR FUSION 4 LEVEL N/A 11/22/2018   Procedure: Posterior Lateral and Interbody fusion - Lumbar one-Lumbar two; explore fusion, posterior instrumentation and fusion Thoracic ten to the ilium;  Surgeon: Newman Pies, MD;  Location: [facility];  Service: Neurosurgery;  Laterality: N/A;  . SPINAL CORD STIMULATOR INSERTION N/A 01/07/2013   Procedure:  SPINAL CORD STIMULATOR INSERTION;  Surgeon:  [name], MD;  Location: [facility];  Service: Neurosurgery;  Laterality: N/A;  . SPINAL CORD STIMULATOR INSERTION N/A 02/09/2019   Procedure: REPLACEMENT OF LUMBAR SPINAL CORD STIMULATOR;  Surgeon: Newman Pies, MD;  Location: [facility];  Service: Neurosurgery;  Laterality: N/A;  Thoracic/Lumbar spine  . TONSILLECTOMY     patient denies  . TOTAL HIP ARTHROPLASTY Right 10/03/2016   Procedure: TOTAL HIP ARTHROPLASTY;  Surgeon: [name], MD;  Location: [facility];  Service: Orthopedics;  Laterality: Right;  . WISDOM TOOTH EXTRACTION     hx of    There were no vitals filed for this visit.  Subjective Assessment - 06/06/19 1104    Subjective  Pt feeling good today, some soreness in B calves from walking in driveway over the weekend.    Pertinent History  NSTEMI, HTN, HLD, chronic back pain with multiple surgeries and spinal cord stimulator, CAD, ischemic cardiomyopathy with coronary angioplasty with stent placement, L ventricular aneurysm, type 2 DM, obesity, R hip OA with R THA    Currently in Pain?  Yes    Pain Score  4     Pain Location  Back    Pain Orientation  Lower    Pain Descriptors / Indicators  Tiring    Pain Type  Surgical pain    Pain Onset  More than a month ago    Pain Frequency  Intermittent    Aggravating Factors   increased activity    Pain Relieving Factors  rest                       [facility] Adult PT Treatment/Exercise - 06/06/19 1108      Ambulation/Gait   Ambulation/Gait  Yes    Ambulation/Gait Assistance  4: Min guard    Ambulation/Gait Assistance Details  Addressed outdoor gait during session with SPC over unlevel paved surfaces and grassy surfaces.  Cues for improved R hip activation in stance, but does demo improvement throughout session.  Also ambulated through grass with SPC with no overt LOB, but verbal and tactile cues for posture.  Also addressed gait over transitions from grass to pavement and up/down curb step.  Continue to note  Trendelenburg type gait pattern, but overall did well.  See  below for curb details.     Ambulation Distance (Feet)  500 Feet    Assistive device  Straight cane    Gait Pattern  Step-through pattern;Decreased stride length;Decreased hip/knee flexion - right;Decreased hip/knee flexion - left;Decreased dorsiflexion - right;Decreased dorsiflexion - left;Trunk flexed;Poor foot clearance - left;Poor foot clearance - right    Ambulation Surface  Level;Unlevel;Indoor;Outdoor;Paved;Grass    Curb  4: Min  assist    Curb Details (indicate cue type and reason)  Cues and demonstration for sequencing and stepping through both when ascending and descending curb.        Neuro Re-ed    Neuro Re-ed Details   High level balance standing on blue therapy mat alternating cone tap without use of AD x 6 reps (use of mirror so pt does not have to look down).  Cues for improved R hip activation when lifting LLE.  Pt frustrated that this is harder than lifting RLE.  Educated on R hip weakness and process of nerve regeneration from back surgery taking a long time to happen.  Pt has made great progress. Pt verbalized understanding.       Lumbar Exercises: Aerobic   Other Aerobic Exercise  SciFit stepper x 10 mins at level 3.5 resistance maintaining rpms in 60-70 without rest.                PT Short Term Goals - 05/26/19 1449      PT SHORT TERM GOAL #1   Title  Pt will participate in further assessment of falls risk and endurance with DGI and 2 minute walk test of endurance    Baseline  DGI 9/24 and 2 minute walk test = 201 feet both performed with 2WRW to 70' with RW on 2MWT and 15/24 without AD on 05/26/19    Time  4    Period  Weeks    Status  Achieved    Target Date  05/26/19      PT SHORT TERM GOAL #2   Title  Pt will demonstrate independence with initial HEP    Baseline  met except for corner balance exercises added last visit.    Time  4    Period  Weeks    Status  Partially Met    Target Date   05/26/19      PT SHORT TERM GOAL #3   Title  Pt will demonstrate improvement in LE strength as indicated by improvement in five time sit to stand from chair with use of UE to </= 28 seconds    Baseline  32.94 to 8.22 secs without UE support from standard arm chair    Time  4    Period  Weeks    Status  Achieved    Target Date  05/26/19      PT SHORT TERM GOAL #4   Title  Pt will improve gait velocity with RW to >/= 1.8 ft/sec    Baseline  1.12 ft/sec to 2.89 ft/sec with RW    Time  4    Period  Weeks    Status  Achieved    Target Date  05/26/19      PT SHORT TERM GOAL #5   Title  Pt will negotiate 4 stairs with 2 rails, alternating sequence Mod I    Baseline  met with single rail on 05/26/19    Time  4    Period  Weeks    Status  Achieved    Target Date  05/26/19        PT Long Term Goals - 05/30/19 1305      PT LONG TERM GOAL #1   Title  Pt will be independent with final HEP    Time  8    Period  Weeks    Status  New      PT LONG TERM GOAL #2   Title  Pt will  improve 6MWT distance to >/=993' with LRAD in order to indicate improved endurance.    Time  8    Period  Weeks    Status  New      PT LONG TERM GOAL #3   Title  Pt will improve gait speed to >/= 2.62 ft/sec with SPC.    Time  8    Period  Weeks    Status  Revised      PT LONG TERM GOAL #4   Title  Pt will improve DGI to >/=18/24 without AD to indicate decreased falls when ambulating in the community    Time  8    Period  Weeks    Status  New      PT LONG TERM GOAL #5   Title  Pt will ambulate 500' over unlevel outdoor paved surfaces w/ SPC at mod I level in order to indicate improved community mobility.    Time  8    Period  Weeks    Status  New         Progress Note Reporting Period 04/25/19 to 06/06/19  See note below for Objective Data and Assessment of Progress/Goals.         Plan - 06/06/19 1307    Clinical Impression Statement  Skilled session focused on outdoor gait with SPC, high  level balance and strengthening.  Pt making excellent progress towards goals but continues to demonstrate poor posture and RLE weakness.    Personal Factors and Comorbidities  Comorbidity 3+;Fitness;Past/Current Experience    Comorbidities  NSTEMI, HTN, HLD, chronic back pain with multiple surgeries and spinal cord stimulator, CAD, ischemic cardiomyopathy with coronary angioplasty with stent placement, L ventricular aneurysm, type 2 DM, obesity, R hip OA with R THA    Examination-Activity Limitations  Bed Mobility;Bend;Dressing;Lift;Locomotion Level;Stairs;Stand    Examination-Participation Restrictions  Cleaning;Community Activity    Stability/Clinical Decision Making  Evolving/Moderate complexity    Rehab Potential  Good    PT Frequency  2x / week    PT Duration  8 weeks    PT Treatment/Interventions  ADLs/Self Care Home Management;Aquatic Therapy;Cryotherapy;Moist Heat;DME Instruction;Gait training;Stair training;Functional mobility training;Therapeutic activities;Therapeutic exercise;Balance training;Neuromuscular re-education;Patient/family education;Orthotic Fit/Training;Scar mobilization;Passive range of motion;Dry needling;Taping;Manual techniques    PT Next Visit Plan  hip abd strength, continue balance, compliant surfaces, EC, gait with SPC as able,  PATIENT WEARS TLSO, BACK PRECAUTIONS. add seated hamstring stretch to HEP, (and more) isometric strengthening; initiate standing balance and endurance activities, R hip strengthening    PT Home Exercise Plan  [id number]    Consulted and Agree with Plan of Care  Patient       Patient will benefit from skilled therapeutic intervention in order to improve the following deficits and impairments:  Decreased activity tolerance, Decreased balance, Decreased endurance, Decreased mobility, Decreased range of motion, Decreased strength, Difficulty walking, Increased edema, Impaired sensation, Postural dysfunction, Pain  Visit Diagnosis: Muscle weakness  (generalized)  Abnormal posture  Chronic bilateral low back pain, unspecified whether sciatica present  Difficulty in walking, not elsewhere classified  Repeated falls     Problem List Patient Active Problem List   Diagnosis Date Noted  . Chronic low back pain 02/09/2019  . Spinal stenosis of lumbar region with neurogenic claudication 11/22/2018  . Radiculopathy of lumbar region 05/31/2017  . HCAP (healthcare-associated pneumonia) 05/30/2017  . Leukocytosis 05/30/2017  . Lumbar pseudoarthrosis 05/27/2017  . Unstable angina ([place]) 02/18/2017  . Primary localized osteoarthritis of right hip 10/03/2016  . Chest pain,  rule out acute myocardial infarction 05/01/2016  . Diabetes mellitus type 2, controlled, with complications ([place]) 96/78/9381  . Obesity (BMI 30-39.9) 05/01/2016  . Left ventricular aneurysm 12/03/2015  . Ischemic cardiomyopathy   . CAD (coronary artery disease) 07/16/2015  . Hypokalemia 07/16/2015  . History of non-ST elevation myocardial infarction (NSTEMI) 07/15/2015  . Hypertension 07/15/2015  . Hyperlipidemia with target LDL less than 100 07/15/2015  . Back pain 07/15/2015  . GERD (gastroesophageal reflux disease) 06/18/2012  . Chronic radicular low back pain 06/18/2012    Cameron Sprang, PT, MPT [facility] [address] [place], [place], [address] Phone: [phone]   Fax:  [phone] 06/06/19, 1:11 PM  Name: AVIEL DAVALOS MRN: [id number] Date of Birth: [birth year]

## 2019-06-09 ENCOUNTER — Other Ambulatory Visit: Payer: Self-pay

## 2019-06-09 ENCOUNTER — Encounter: Payer: Self-pay | Admitting: Physical Therapy

## 2019-06-09 ENCOUNTER — Ambulatory Visit: Payer: Medicare Other | Admitting: Physical Therapy

## 2019-06-09 DIAGNOSIS — G8929 Other chronic pain: Secondary | ICD-10-CM

## 2019-06-09 DIAGNOSIS — M6281 Muscle weakness (generalized): Secondary | ICD-10-CM

## 2019-06-09 DIAGNOSIS — R296 Repeated falls: Secondary | ICD-10-CM

## 2019-06-09 DIAGNOSIS — R293 Abnormal posture: Secondary | ICD-10-CM

## 2019-06-09 DIAGNOSIS — R262 Difficulty in walking, not elsewhere classified: Secondary | ICD-10-CM

## 2019-06-09 NOTE — Therapy (Signed)
[facility] [address] [place], [place], [address] Phone: [phone]   Fax:  [phone]  Physical Therapy Treatment  Patient Details  Name: Thomas Mcclure MRN: [id number] Date of Birth: 1955-08-02 Referring Provider (PT): Newman Pies, MD   Encounter Date: 06/09/2019  PT End of Session - 06/09/19 1301    Visit Number  11    Number of Visits  17    Date for PT Re-Evaluation  06/25/19    Authorization Type  [facility] - 10th visit PN.  $0 copay as long as Medicaid is active.  VL: MN    Progress Note Due on Visit  20    PT Start Time  1105    PT Stop Time  1150    PT Time Calculation (min)  45 min    Equipment Utilized During Treatment  Back brace    Activity Tolerance  Patient tolerated treatment well    Behavior During Therapy  WFL for tasks assessed/performed       Past Medical History:  Diagnosis Date  . Baker's cyst    Left calf  . CAD in native artery, 07/15/15 PCI of RCA with DES 07/16/2015   a.   NSTEMI 5/17: LHC - pLAD 20, pLCx 20, OM1 30, pRCA 100, EF 45-50%>> PCI: 2.5 x 24 mm Promus DES to RCA  //  b.   Echo 5/17: mild LVH, EF 50-55%, no RWMA, mod RVE //  c. LHC 6/17: pLAD 20, pLCx 20, OM1 50, pRCA stent ok, EF 35-45% with mod sized inf wall and basal segment aneurysm  . CHF (congestive heart failure) ([place])   . Chronic back pain    "down my back, down my legs" (02/18/2017)  . Constipation   . Constipation due to opioid therapy   . GERD (gastroesophageal reflux disease)    04/07/2019- not current  . Hyperlipidemia   . Hypertension    Dr. [name] [phone]  . Ischemic cardiomyopathy    a. LV-gram at time of LHC in 6/17 with EF 35-45%  //  b. Echo 7/17: EF 45-50%, inferior HK, grade 1 diastolic dysfunction, mildly dilated aortic root, moderately reduced RVSF, mild RAE  . Myocardial infarction ([place]) 2017  . 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 Numbness and tingling of both  lower extremities    "on the outside of both sides" (02/18/2017)  . Rheumatoid arthritis ([facility])    RA  . Tachycardia   . Type II diabetes mellitus ([facility])    diet controlled    Past Surgical History:  Procedure Laterality Date  . BACK SURGERY     x3  . CARDIAC CATHETERIZATION N/A 07/15/2015   Procedure: Left Heart Cath and Coronary Angiography;  Surgeon: [name], MD;  Location: [facility];  Service: Cardiovascular;  Laterality: N/A;  . CARDIAC CATHETERIZATION N/A 07/15/2015   Procedure: Coronary Stent Intervention;  Surgeon: [name], MD;  Location: [facility];  Service: Cardiovascular;  Laterality: N/A;  . CARDIAC CATHETERIZATION N/A 08/07/2015   Procedure: Left Heart Cath and Coronary Angiography;  Surgeon: [name], MD;  Location: [facility];  Service: Cardiovascular;  Laterality: N/A;  . CORONARY ANGIOPLASTY WITH STENT PLACEMENT  07/2015  . LEFT HEART CATH AND CORONARY ANGIOGRAPHY N/A 02/19/2017   Procedure: LEFT HEART CATH AND CORONARY ANGIOGRAPHY;  Surgeon: Martinique, Peter M, MD;  Location: [facility];  Service: Cardiovascular;  Laterality: N/A;  . LUMBAR FUSION  04/11/2019   REVISION OF THORACOLUMBAR FUSION   . LUMBAR SPINE SURGERY  03/2009  & 2012  . MASS EXCISION N/A 04/19/2012   Procedure: removal of posterior cervical lipoma;  Surgeon: [name], MD;  Location: [facility];  Service: Neurosurgery;  Laterality: N/A;  Removal of posterior cervical lipoma  . POPLITEAL SYNOVIAL CYST EXCISION Left    "opened up behind my knee; scraped out arthritis"  . POSTERIOR LUMBAR FUSION 4 LEVEL N/A 11/22/2018   Procedure: Posterior Lateral and Interbody fusion - Lumbar one-Lumbar two; explore fusion, posterior instrumentation and fusion Thoracic ten to the ilium;  Surgeon: Newman Pies, MD;  Location: [facility];  Service: Neurosurgery;  Laterality: N/A;  . SPINAL CORD STIMULATOR INSERTION N/A 01/07/2013   Procedure:  SPINAL CORD STIMULATOR  INSERTION;  Surgeon: [name], MD;  Location: [facility];  Service: Neurosurgery;  Laterality: N/A;  . SPINAL CORD STIMULATOR INSERTION N/A 02/09/2019   Procedure: REPLACEMENT OF LUMBAR SPINAL CORD STIMULATOR;  Surgeon: Newman Pies, MD;  Location: [facility];  Service: Neurosurgery;  Laterality: N/A;  Thoracic/Lumbar spine  . TONSILLECTOMY     patient denies  . TOTAL HIP ARTHROPLASTY Right 10/03/2016   Procedure: TOTAL HIP ARTHROPLASTY;  Surgeon: [name], MD;  Location: [facility];  Service: Orthopedics;  Laterality: Right;  . WISDOM TOOTH EXTRACTION     hx of    There were no vitals filed for this visit.  Subjective Assessment - 06/09/19 1110    Subjective  Calves feel better, it was edema and that is better.  Was in the yard yesterday all day.  No issues in his back, some soreness in hamstrings.  Goes back in May for follow up with surgeon and hopes to come out of back brace.    Pertinent History  NSTEMI, HTN, HLD, chronic back pain with multiple surgeries and spinal cord stimulator, CAD, ischemic cardiomyopathy with coronary angioplasty with stent placement, L ventricular aneurysm, type 2 DM, obesity, R hip OA with R THA    Patient Stated Goals  To walk without the RW    Currently in Pain?  Yes    Pain Onset  More than a month ago                       [facility] Adult PT Treatment/Exercise - 06/09/19 1114      Ambulation/Gait   Ambulation/Gait  Yes    Ambulation/Gait Assistance  6: Modified independent (Device/Increase time)    Ambulation/Gait Assistance Details  with RW; still presents with significant trunk flexion    Ambulation Distance (Feet)  230 Feet    Assistive device  Rolling walker    Gait Pattern  Trendelenburg;Trunk flexed    Ambulation Surface  Level;Indoor      Lumbar Exercises: Stretches   Active Hamstring Stretch  Right;Left;1 rep;60 seconds    Active Hamstring Stretch Limitations  seated edge of mat    Standing Side Bend  Right;5 reps;10  seconds    Standing Side Bend Limitations  SEATED side bend to L over bloster with pt reaching up and over while therapist performed R pelvic depression      Lumbar Exercises: Aerobic   Other Aerobic Exercise  SciFit stepper x 10 minutes at level 3 resistance with LE only      Knee/Hip Exercises: Standing   Lateral Step Up  Right;Left;1 set;10 reps;Hand Hold: 1;Step Height: 4"    Lateral  Step Up Limitations  greater difficulty to the R due to significant glute med weakness; L hip drop stepping up with R    Forward Step Up  Right;Left;1 set;10 reps;Hand Hold: 0;Step Height: 4"    Forward Step Up Limitations  greater difficulty leading with RLE due to glute med weakness; required min-mod A for balance when not holding counter top; UE support required when leading with RLE             PT Education - 06/09/19 1300    Education Details  weakness in glute med and how it affects balance    Person(s) Educated  Patient    Methods  Explanation    Comprehension  Verbalized understanding       PT Short Term Goals - 05/26/19 1449      PT SHORT TERM GOAL #1   Title  Pt will participate in further assessment of falls risk and endurance with DGI and 2 minute walk test of endurance    Baseline  DGI 9/24 and 2 minute walk test = 201 feet both performed with 2WRW to 25' with RW on 2MWT and 15/24 without AD on 05/26/19    Time  4    Period  Weeks    Status  Achieved    Target Date  05/26/19      PT SHORT TERM GOAL #2   Title  Pt will demonstrate independence with initial HEP    Baseline  met except for corner balance exercises added last visit.    Time  4    Period  Weeks    Status  Partially Met    Target Date  05/26/19      PT SHORT TERM GOAL #3   Title  Pt will demonstrate improvement in LE strength as indicated by improvement in five time sit to stand from chair with use of UE to </= 28 seconds    Baseline  32.94 to 8.22 secs without UE support from standard arm chair    Time  4     Period  Weeks    Status  Achieved    Target Date  05/26/19      PT SHORT TERM GOAL #4   Title  Pt will improve gait velocity with RW to >/= 1.8 ft/sec    Baseline  1.12 ft/sec to 2.89 ft/sec with RW    Time  4    Period  Weeks    Status  Achieved    Target Date  05/26/19      PT SHORT TERM GOAL #5   Title  Pt will negotiate 4 stairs with 2 rails, alternating sequence Mod I    Baseline  met with single rail on 05/26/19    Time  4    Period  Weeks    Status  Achieved    Target Date  05/26/19        PT Long Term Goals - 05/30/19 1305      PT LONG TERM GOAL #1   Title  Pt will be independent with final HEP    Time  8    Period  Weeks    Status  New      PT LONG TERM GOAL #2   Title  Pt will improve 6MWT distance to >/=993' with LRAD in order to indicate improved endurance.    Time  8    Period  Weeks    Status  New      PT  LONG TERM GOAL #3   Title  Pt will improve gait speed to >/= 2.62 ft/sec with SPC.    Time  8    Period  Weeks    Status  Revised      PT LONG TERM GOAL #4   Title  Pt will improve DGI to >/=18/24 without AD to indicate decreased falls when ambulating in the community    Time  8    Period  Weeks    Status  New      PT LONG TERM GOAL #5   Title  Pt will ambulate 500' over unlevel outdoor paved surfaces w/ SPC at mod I level in order to indicate improved community mobility.    Time  8    Period  Weeks    Status  New            Plan - 06/09/19 1301    Clinical Impression Statement  Continued to progress aerobic conditioning and continued with glute max and glute med strengthening with step ups.  Pt continues to have significant difficulty on R side and required increased assistance from therapist and UE support maintain balance.  Will continue to address and progress towards LTG.    Personal Factors and Comorbidities  Comorbidity 3+;Fitness;Past/Current Experience    Comorbidities  NSTEMI, HTN, HLD, chronic back pain with multiple surgeries  and spinal cord stimulator, CAD, ischemic cardiomyopathy with coronary angioplasty with stent placement, L ventricular aneurysm, type 2 DM, obesity, R hip OA with R THA    Examination-Activity Limitations  Bed Mobility;Bend;Dressing;Lift;Locomotion Level;Stairs;Stand    Examination-Participation Restrictions  Cleaning;Community Activity    Stability/Clinical Decision Making  Evolving/Moderate complexity    Rehab Potential  Good    PT Frequency  2x / week    PT Duration  8 weeks    PT Treatment/Interventions  ADLs/Self Care Home Management;Aquatic Therapy;Cryotherapy;Moist Heat;DME Instruction;Gait training;Stair training;Functional mobility training;Therapeutic activities;Therapeutic exercise;Balance training;Neuromuscular re-education;Patient/family education;Orthotic Fit/Training;Scar mobilization;Passive range of motion;Dry needling;Taping;Manual techniques    PT Next Visit Plan  hip abd strength-step ups on shorter step, SLS; continue balance, compliant surfaces, EC, gait with SPC as able,  PATIENT WEARS TLSO, BACK PRECAUTIONS. add seated hamstring stretch to HEP, (and more) isometric strengthening; initiate standing balance and endurance activities, R hip strengthening    PT Home Exercise Plan  [id number]    Consulted and Agree with Plan of Care  Patient       Patient will benefit from skilled therapeutic intervention in order to improve the following deficits and impairments:  Decreased activity tolerance, Decreased balance, Decreased endurance, Decreased mobility, Decreased range of motion, Decreased strength, Difficulty walking, Increased edema, Impaired sensation, Postural dysfunction, Pain  Visit Diagnosis: Muscle weakness (generalized)  Abnormal posture  Chronic bilateral low back pain, unspecified whether sciatica present  Difficulty in walking, not elsewhere classified  Repeated falls     Problem List Patient Active Problem List   Diagnosis Date Noted  . Chronic low back  pain 02/09/2019  . Spinal stenosis of lumbar region with neurogenic claudication 11/22/2018  . Radiculopathy of lumbar region 05/31/2017  . HCAP (healthcare-associated pneumonia) 05/30/2017  . Leukocytosis 05/30/2017  . Lumbar pseudoarthrosis 05/27/2017  . Unstable angina ([facility]) 02/18/2017  . Primary localized osteoarthritis of right hip 10/03/2016  . Chest pain, rule out acute myocardial infarction 05/01/2016  . Diabetes mellitus type 2, controlled, with complications ([place]) 43/15/4008  . Obesity (BMI 30-39.9) 05/01/2016  . Left ventricular aneurysm 12/03/2015  . Ischemic cardiomyopathy   . CAD (  coronary artery disease) 07/16/2015  . Hypokalemia 07/16/2015  . History of non-ST elevation myocardial infarction (NSTEMI) 07/15/2015  . Hypertension 07/15/2015  . Hyperlipidemia with target LDL less than 100 07/15/2015  . Back pain 07/15/2015  . GERD (gastroesophageal reflux disease) 06/18/2012  . Chronic radicular low back pain 06/18/2012    Rico Junker, PT, DPT 06/09/19    1:04 PM    [facility] [address] [place], [place], [address] Phone: [phone]   Fax:  [phone]  Name: MIKLE STERNBERG MRN: [id number] Date of Birth: 04/13/1955

## 2019-06-13 ENCOUNTER — Encounter: Payer: Self-pay | Admitting: Rehabilitation

## 2019-06-13 ENCOUNTER — Other Ambulatory Visit: Payer: Self-pay

## 2019-06-13 ENCOUNTER — Ambulatory Visit: Payer: Medicare Other | Admitting: Rehabilitation

## 2019-06-13 DIAGNOSIS — R293 Abnormal posture: Secondary | ICD-10-CM

## 2019-06-13 DIAGNOSIS — R296 Repeated falls: Secondary | ICD-10-CM

## 2019-06-13 DIAGNOSIS — G8929 Other chronic pain: Secondary | ICD-10-CM

## 2019-06-13 DIAGNOSIS — R262 Difficulty in walking, not elsewhere classified: Secondary | ICD-10-CM

## 2019-06-13 DIAGNOSIS — M6281 Muscle weakness (generalized): Secondary | ICD-10-CM

## 2019-06-13 DIAGNOSIS — M545 Low back pain, unspecified: Secondary | ICD-10-CM

## 2019-06-13 DIAGNOSIS — R208 Other disturbances of skin sensation: Secondary | ICD-10-CM

## 2019-06-13 NOTE — Therapy (Signed)
[facility] [address] [place], [place], [address] Phone: [phone]   Fax:  [phone]  Physical Therapy Treatment  Patient Details  Name: Thomas Mcclure MRN: [id number] Date of Birth: May 09, 1955 Referring Provider (PT): Newman Pies, MD   Encounter Date: 06/13/2019  PT End of Session - 06/13/19 1253    Visit Number  12    Number of Visits  17    Date for PT Re-Evaluation  06/25/19    Authorization Type  [place] - 10th visit PN.  $0 copay as long as Medicaid is active.  VL: MN    Progress Note Due on Visit  20    PT Start Time  1103    PT Stop Time  1145    PT Time Calculation (min)  42 min    Equipment Utilized During Treatment  Back brace    Activity Tolerance  Patient tolerated treatment well    Behavior During Therapy  WFL for tasks assessed/performed       Past Medical History:  Diagnosis Date  . Baker's cyst    Left calf  . CAD in native artery, 07/15/15 PCI of RCA with DES 07/16/2015   a.   NSTEMI 5/17: LHC - pLAD 20, pLCx 20, OM1 30, pRCA 100, EF 45-50%>> PCI: 2.5 x 24 mm Promus DES to RCA  //  b.   Echo 5/17: mild LVH, EF 50-55%, no RWMA, mod RVE //  c. LHC 6/17: pLAD 20, pLCx 20, OM1 50, pRCA stent ok, EF 35-45% with mod sized inf wall and basal segment aneurysm  . CHF (congestive heart failure) ([place])   . Chronic back pain    "down my back, down my legs" (02/18/2017)  . Constipation   . Constipation due to opioid therapy   . GERD (gastroesophageal reflux disease)    04/07/2019- not current  . Hyperlipidemia   . Hypertension    Dr. [name] [phone]  . Ischemic cardiomyopathy    a. LV-gram at time of LHC in 6/17 with EF 35-45%  //  b. Echo 7/17: EF 45-50%, inferior HK, grade 1 diastolic dysfunction, mildly dilated aortic root, moderately reduced RVSF, mild RAE  . Myocardial infarction ([place]) 2017  . 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 Numbness and tingling of both  lower extremities    "on the outside of both sides" (02/18/2017)  . Rheumatoid arthritis ([facility])    RA  . Tachycardia   . Type II diabetes mellitus ([place])    diet controlled    Past Surgical History:  Procedure Laterality Date  . BACK SURGERY     x3  . CARDIAC CATHETERIZATION N/A 07/15/2015   Procedure: Left Heart Cath and Coronary Angiography;  Surgeon: [name], MD;  Location: [facility];  Service: Cardiovascular;  Laterality: N/A;  . CARDIAC CATHETERIZATION N/A 07/15/2015   Procedure: Coronary Stent Intervention;  Surgeon: [name], MD;  Location: [facility];  Service: Cardiovascular;  Laterality: N/A;  . CARDIAC CATHETERIZATION N/A 08/07/2015   Procedure: Left Heart Cath and Coronary Angiography;  Surgeon: [name], MD;  Location: [facility];  Service: Cardiovascular;  Laterality: N/A;  . CORONARY ANGIOPLASTY WITH STENT PLACEMENT  07/2015  . LEFT HEART CATH AND CORONARY ANGIOGRAPHY N/A 02/19/2017   Procedure: LEFT HEART CATH AND CORONARY ANGIOGRAPHY;  Surgeon: Martinique, Peter M, MD;  Location: [facility];  Service: Cardiovascular;  Laterality: N/A;  . LUMBAR FUSION  04/11/2019   REVISION OF THORACOLUMBAR FUSION   . LUMBAR SPINE SURGERY  03/2009  & 2012  . MASS EXCISION N/A 04/19/2012   Procedure: removal of posterior cervical lipoma;  Surgeon: [name], MD;  Location: [facility];  Service: Neurosurgery;  Laterality: N/A;  Removal of posterior cervical lipoma  . POPLITEAL SYNOVIAL CYST EXCISION Left    "opened up behind my knee; scraped out arthritis"  . POSTERIOR LUMBAR FUSION 4 LEVEL N/A 11/22/2018   Procedure: Posterior Lateral and Interbody fusion - Lumbar one-Lumbar two; explore fusion, posterior instrumentation and fusion Thoracic ten to the ilium;  Surgeon: Newman Pies, MD;  Location: [facility];  Service: Neurosurgery;  Laterality: N/A;  . SPINAL CORD STIMULATOR INSERTION N/A 01/07/2013   Procedure:  SPINAL CORD STIMULATOR  INSERTION;  Surgeon: [name], MD;  Location: [facility];  Service: Neurosurgery;  Laterality: N/A;  . SPINAL CORD STIMULATOR INSERTION N/A 02/09/2019   Procedure: REPLACEMENT OF LUMBAR SPINAL CORD STIMULATOR;  Surgeon: Newman Pies, MD;  Location: [facility];  Service: Neurosurgery;  Laterality: N/A;  Thoracic/Lumbar spine  . TONSILLECTOMY     patient denies  . TOTAL HIP ARTHROPLASTY Right 10/03/2016   Procedure: TOTAL HIP ARTHROPLASTY;  Surgeon: [name], MD;  Location: [facility];  Service: Orthopedics;  Laterality: Right;  . WISDOM TOOTH EXTRACTION     hx of    There were no vitals filed for this visit.  Subjective Assessment - 06/13/19 1105    Subjective  Pt has been running errands this morning, doing well.    Pertinent History  NSTEMI, HTN, HLD, chronic back pain with multiple surgeries and spinal cord stimulator, CAD, ischemic cardiomyopathy with coronary angioplasty with stent placement, L ventricular aneurysm, type 2 DM, obesity, R hip OA with R THA    Limitations  Standing;Walking;House hold activities    How long can you sit comfortably?  1 hour    How long can you stand comfortably?  10 minutes    How long can you walk comfortably?  short distances in my home    Patient Stated Goals  To walk without the RW    Currently in Pain?  Yes    Pain Score  3     Pain Location  Back    Pain Orientation  Lower    Pain Descriptors / Indicators  Tiring    Pain Type  Surgical pain    Pain Onset  More than a month ago    Pain Frequency  Intermittent    Aggravating Factors   increased activity    Pain Relieving Factors  rest                       [facility] Adult PT Treatment/Exercise - 06/13/19 1112      Ambulation/Gait   Ambulation/Gait  Yes    Ambulation/Gait Assistance  6: Modified independent (Device/Increase time)    Ambulation/Gait Assistance Details  Throughout session with RW.  Continues to demo forward flexed posture.     Ambulation Distance (Feet)   150 Feet    Assistive device  Rolling walker    Gait Pattern  Trendelenburg;Trunk flexed    Ambulation Surface  Level;Indoor      Neuro Re-ed    Neuro Re-ed Details   High level balance in // bars:  with RLE in stance on blue top BOSU moving LLE into abd x 10 reps, then x 10  reps vice versa.  Progressed to RLE in stance lifting LLE into march x 10 reps then vice versa with light BUE support.  Standing beside BOSU with BUE support from PT, moving BOSU to the side with side step x 15' each direction.  Cues for improved proximal hip activation.   In tall kneeling for improved proximal hip activation (BUE support on physioball) mini squats x 10 reps with cues for increased hip extension vs lumbar extension, LLE abd x 10 reps with tactile and verbal cues for R glute med activation.        Exercises   Other Exercises   supine hip flex stretch off EOM x 2 mins on R side (encouraged both sides at home) Note marked tightness as foot does not touch floor without assist.       Lumbar Exercises: Aerobic   Other Aerobic Exercise  Scifit x 10 mins at level 4 resistance with rpms in 60's due to more resistance.              PT Education - 06/13/19 1253    Education Details  verbally added hip flex stretch to HEP    Person(s) Educated  Patient    Methods  Explanation;Demonstration    Comprehension  Verbalized understanding;Returned demonstration       PT Short Term Goals - 05/26/19 1449      PT SHORT TERM GOAL #1   Title  Pt will participate in further assessment of falls risk and endurance with DGI and 2 minute walk test of endurance    Baseline  DGI 9/24 and 2 minute walk test = 201 feet both performed with 2WRW to 18' with RW on 2MWT and 15/24 without AD on 05/26/19    Time  4    Period  Weeks    Status  Achieved    Target Date  05/26/19      PT SHORT TERM GOAL #2   Title  Pt will demonstrate independence with initial HEP    Baseline  met except for corner balance exercises added last  visit.    Time  4    Period  Weeks    Status  Partially Met    Target Date  05/26/19      PT SHORT TERM GOAL #3   Title  Pt will demonstrate improvement in LE strength as indicated by improvement in five time sit to stand from chair with use of UE to </= 28 seconds    Baseline  32.94 to 8.22 secs without UE support from standard arm chair    Time  4    Period  Weeks    Status  Achieved    Target Date  05/26/19      PT SHORT TERM GOAL #4   Title  Pt will improve gait velocity with RW to >/= 1.8 ft/sec    Baseline  1.12 ft/sec to 2.89 ft/sec with RW    Time  4    Period  Weeks    Status  Achieved    Target Date  05/26/19      PT SHORT TERM GOAL #5   Title  Pt will negotiate 4 stairs with 2 rails, alternating sequence Mod I    Baseline  met with single rail on 05/26/19    Time  4    Period  Weeks    Status  Achieved    Target Date  05/26/19        PT Long Term  Goals - 06/13/19 1256      PT LONG TERM GOAL #1   Title  Pt will be independent with final HEP (All LTGs due 06/25/19)    Time  8    Period  Weeks    Status  New      PT LONG TERM GOAL #2   Title  Pt will improve 6MWT distance to >/=993' with LRAD in order to indicate improved endurance.    Time  8    Period  Weeks    Status  New      PT LONG TERM GOAL #3   Title  Pt will improve gait speed to >/= 2.62 ft/sec with SPC.    Time  8    Period  Weeks    Status  Revised      PT LONG TERM GOAL #4   Title  Pt will improve DGI to >/=18/24 without AD to indicate decreased falls when ambulating in the community    Time  8    Period  Weeks    Status  New      PT LONG TERM GOAL #5   Title  Pt will ambulate 500' over unlevel outdoor paved surfaces w/ SPC at mod I level in order to indicate improved community mobility.    Time  8    Period  Weeks    Status  New            Plan - 06/13/19 1254    Clinical Impression Statement  Skilled session continues to focus on R proximal hip strengthening with open and  closed chain tasks, balance on compliant surfaces and ended with hip flexor stretch.  Note marked tightness in R hip that is likely preventing full upright posture.    Personal Factors and Comorbidities  Comorbidity 3+;Fitness;Past/Current Experience    Comorbidities  NSTEMI, HTN, HLD, chronic back pain with multiple surgeries and spinal cord stimulator, CAD, ischemic cardiomyopathy with coronary angioplasty with stent placement, L ventricular aneurysm, type 2 DM, obesity, R hip OA with R THA    Examination-Activity Limitations  Bed Mobility;Bend;Dressing;Lift;Locomotion Level;Stairs;Stand    Examination-Participation Restrictions  Cleaning;Community Activity    Stability/Clinical Decision Making  Evolving/Moderate complexity    Rehab Potential  Good    PT Frequency  2x / week    PT Duration  8 weeks    PT Treatment/Interventions  ADLs/Self Care Home Management;Aquatic Therapy;Cryotherapy;Moist Heat;DME Instruction;Gait training;Stair training;Functional mobility training;Therapeutic activities;Therapeutic exercise;Balance training;Neuromuscular re-education;Patient/family education;Orthotic Fit/Training;Scar mobilization;Passive range of motion;Dry needling;Taping;Manual techniques    PT Next Visit Plan  add hip flex stretch to HEP, work towards LTGs (we discussed probable extension for 4 more weeks), hip abd strength-step ups on shorter step, SLS; continue balance, compliant surfaces, EC, gait with SPC as able,  PATIENT WEARS TLSO, BACK PRECAUTIONS. add seated hamstring stretch to HEP, (and more) isometric strengthening; initiate standing balance and endurance activities, R hip strengthening    PT Home Exercise Plan  [id number]    Consulted and Agree with Plan of Care  Patient       Patient will benefit from skilled therapeutic intervention in order to improve the following deficits and impairments:  Decreased activity tolerance, Decreased balance, Decreased endurance, Decreased mobility, Decreased  range of motion, Decreased strength, Difficulty walking, Increased edema, Impaired sensation, Postural dysfunction, Pain  Visit Diagnosis: Muscle weakness (generalized)  Abnormal posture  Chronic bilateral low back pain, unspecified whether sciatica present  Difficulty in walking, not elsewhere classified  Repeated falls  Other disturbances of skin sensation     Problem List Patient Active Problem List   Diagnosis Date Noted  . Chronic low back pain 02/09/2019  . Spinal stenosis of lumbar region with neurogenic claudication 11/22/2018  . Radiculopathy of lumbar region 05/31/2017  . HCAP (healthcare-associated pneumonia) 05/30/2017  . Leukocytosis 05/30/2017  . Lumbar pseudoarthrosis 05/27/2017  . Unstable angina ([place]) 02/18/2017  . Primary localized osteoarthritis of right hip 10/03/2016  . Chest pain, rule out acute myocardial infarction 05/01/2016  . Diabetes mellitus type 2, controlled, with complications ([place]) 79/98/7215  . Obesity (BMI 30-39.9) 05/01/2016  . Left ventricular aneurysm 12/03/2015  . Ischemic cardiomyopathy   . CAD (coronary artery disease) 07/16/2015  . Hypokalemia 07/16/2015  . History of non-ST elevation myocardial infarction (NSTEMI) 07/15/2015  . Hypertension 07/15/2015  . Hyperlipidemia with target LDL less than 100 07/15/2015  . Back pain 07/15/2015  . GERD (gastroesophageal reflux disease) 06/18/2012  . Chronic radicular low back pain 06/18/2012    Cameron Sprang, PT, MPT [facility] [address] [place], [place], [address] Phone: [phone]   Fax:  [phone] 06/13/19, 1:00 PM  Name: DIONIS AUTRY MRN: [id number] Date of Birth: 02-Sep-1955

## 2019-06-16 ENCOUNTER — Ambulatory Visit: Payer: Medicare Other | Admitting: Physical Therapy

## 2019-06-16 ENCOUNTER — Other Ambulatory Visit: Payer: Self-pay

## 2019-06-16 ENCOUNTER — Encounter: Payer: Self-pay | Admitting: Physical Therapy

## 2019-06-16 DIAGNOSIS — R296 Repeated falls: Secondary | ICD-10-CM

## 2019-06-16 DIAGNOSIS — M6281 Muscle weakness (generalized): Secondary | ICD-10-CM

## 2019-06-16 DIAGNOSIS — G8929 Other chronic pain: Secondary | ICD-10-CM

## 2019-06-16 DIAGNOSIS — R293 Abnormal posture: Secondary | ICD-10-CM

## 2019-06-16 DIAGNOSIS — R208 Other disturbances of skin sensation: Secondary | ICD-10-CM

## 2019-06-16 DIAGNOSIS — M62830 Muscle spasm of back: Secondary | ICD-10-CM

## 2019-06-16 DIAGNOSIS — R262 Difficulty in walking, not elsewhere classified: Secondary | ICD-10-CM

## 2019-06-16 NOTE — Therapy (Signed)
[facility] [address] [place], [place], [address] Phone: [phone]   Fax:  [phone]  Physical Therapy Treatment  Patient Details  Name: Thomas Mcclure MRN: [id number] Date of Birth: 10/15/1955 Referring Provider (PT): Newman Pies, MD   Encounter Date: 06/16/2019  PT End of Session - 06/16/19 1302    Visit Number  13    Number of Visits  17    Date for PT Re-Evaluation  06/25/19    Authorization Type  [employer] - 10th visit PN.  $0 copay as long as Medicaid is active.  VL: MN    Progress Note Due on Visit  20    PT Start Time  1021    PT Stop Time  1104    PT Time Calculation (min)  43 min    Equipment Utilized During Treatment  Back brace    Activity Tolerance  Patient tolerated treatment well    Behavior During Therapy  WFL for tasks assessed/performed       Past Medical History:  Diagnosis Date  . Baker's cyst    Left calf  . CAD in native artery, 07/15/15 PCI of RCA with DES 07/16/2015   a.   NSTEMI 5/17: LHC - pLAD 20, pLCx 20, OM1 30, pRCA 100, EF 45-50%>> PCI: 2.5 x 24 mm Promus DES to RCA  //  b.   Echo 5/17: mild LVH, EF 50-55%, no RWMA, mod RVE //  c. LHC 6/17: pLAD 20, pLCx 20, OM1 50, pRCA stent ok, EF 35-45% with mod sized inf wall and basal segment aneurysm  . CHF (congestive heart failure) ([place])   . Chronic back pain    "down my back, down my legs" (02/18/2017)  . Constipation   . Constipation due to opioid therapy   . GERD (gastroesophageal reflux disease)    04/07/2019- not current  . Hyperlipidemia   . Hypertension    Dr. [name] [phone]  . Ischemic cardiomyopathy    a. LV-gram at time of LHC in 6/17 with EF 35-45%  //  b. Echo 7/17: EF 45-50%, inferior HK, grade 1 diastolic dysfunction, mildly dilated aortic root, moderately reduced RVSF, mild RAE  . Myocardial infarction ([place]) 2017  . 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 Numbness and tingling of both  lower extremities    "on the outside of both sides" (02/18/2017)  . Rheumatoid arthritis ([facility])    RA  . Tachycardia   . Type II diabetes mellitus ([place])    diet controlled    Past Surgical History:  Procedure Laterality Date  . BACK SURGERY     x3  . CARDIAC CATHETERIZATION N/A 07/15/2015   Procedure: Left Heart Cath and Coronary Angiography;  Surgeon: [name], MD;  Location: [facility];  Service: Cardiovascular;  Laterality: N/A;  . CARDIAC CATHETERIZATION N/A 07/15/2015   Procedure: Coronary Stent Intervention;  Surgeon: [name], MD;  Location: [facility];  Service: Cardiovascular;  Laterality: N/A;  . CARDIAC CATHETERIZATION N/A 08/07/2015   Procedure: Left Heart Cath and Coronary Angiography;  Surgeon: [name], MD;  Location: [facility];  Service: Cardiovascular;  Laterality: N/A;  . CORONARY ANGIOPLASTY WITH STENT PLACEMENT  07/2015  . LEFT HEART CATH AND CORONARY ANGIOGRAPHY N/A 02/19/2017   Procedure: LEFT HEART CATH AND CORONARY ANGIOGRAPHY;  Surgeon: Martinique, Peter M, MD;  Location: [facility];  Service: Cardiovascular;  Laterality: N/A;  . LUMBAR FUSION  04/11/2019   REVISION OF THORACOLUMBAR FUSION   . LUMBAR SPINE SURGERY  03/2009  & 2012  . MASS EXCISION N/A 04/19/2012   Procedure: removal of posterior cervical lipoma;  Surgeon: [name], MD;  Location: [facility];  Service: Neurosurgery;  Laterality: N/A;  Removal of posterior cervical lipoma  . POPLITEAL SYNOVIAL CYST EXCISION Left    "opened up behind my knee; scraped out arthritis"  . POSTERIOR LUMBAR FUSION 4 LEVEL N/A 11/22/2018   Procedure: Posterior Lateral and Interbody fusion - Lumbar one-Lumbar two; explore fusion, posterior instrumentation and fusion Thoracic ten to the ilium;  Surgeon: Newman Pies, MD;  Location: [facility];  Service: Neurosurgery;  Laterality: N/A;  . SPINAL CORD STIMULATOR INSERTION N/A 01/07/2013   Procedure:  SPINAL CORD STIMULATOR  INSERTION;  Surgeon: [name], MD;  Location: [facility];  Service: Neurosurgery;  Laterality: N/A;  . SPINAL CORD STIMULATOR INSERTION N/A 02/09/2019   Procedure: REPLACEMENT OF LUMBAR SPINAL CORD STIMULATOR;  Surgeon: Newman Pies, MD;  Location: [facility];  Service: Neurosurgery;  Laterality: N/A;  Thoracic/Lumbar spine  . TONSILLECTOMY     patient denies  . TOTAL HIP ARTHROPLASTY Right 10/03/2016   Procedure: TOTAL HIP ARTHROPLASTY;  Surgeon: [name], MD;  Location: [facility];  Service: Orthopedics;  Laterality: Right;  . WISDOM TOOTH EXTRACTION     hx of    There were no vitals filed for this visit.  Subjective Assessment - 06/16/19 1026    Subjective  Pt reports he is seeing really good results with therapy, felt great Monday and was able to mow without any breaks.    Pertinent History  NSTEMI, HTN, HLD, chronic back pain with multiple surgeries and spinal cord stimulator, CAD, ischemic cardiomyopathy with coronary angioplasty with stent placement, L ventricular aneurysm, type 2 DM, obesity, R hip OA with R THA    Limitations  Standing;Walking;House hold activities    How long can you sit comfortably?  1 hour    Currently in Pain?  No/denies                       [facility] Adult PT Treatment/Exercise - 06/16/19 1037      Bed Mobility   Bed Mobility  Supine to Sit;Sit to Supine    Supine to Sit  Independent with assistive device    Sit to Supine  Independent with assistive device      Transfers   Transfers  Sit to Stand;Stand to Sit;Stand Pivot Transfers    Sit to Stand  6: Modified independent (Device/Increase time)    Stand to Sit  6: Modified independent (Device/Increase time)      Ambulation/Gait   Ambulation/Gait  Yes    Ambulation/Gait Assistance  6: Modified independent (Device/Increase time)    Ambulation/Gait Assistance Details  still ambulates with slightly flexed posture    Ambulation Distance (Feet)  230 Feet    Assistive device  Rolling  walker    Gait Pattern  Trendelenburg;Trunk flexed    Ambulation Surface  Level;Indoor      Lumbar Exercises: Stretches   Hip Flexor Stretch  Right;Left;1 rep;60 seconds    Hip Flexor Stretch Limitations  lying in supine with one leg off side of mat       Lumbar Exercises: Aerobic   Other Aerobic Exercise  SciFit x 11 minutes at level 2 with LE only for aerobic conditioning and LE strengthening; attempting  to go at a faster pace for aerobic      Lumbar Exercises: Supine   Single Leg Bridge  Other (comment)    Other Supine Lumbar Exercises  Following hip flexor stretch with LE still off side of mat but on 4" step to perform single leg bridge for terminal hip extension; 8 reps each side      Knee/Hip Exercises: Standing   Hip Abduction  Stengthening;Right;Left;1 set;Knee straight;Limitations   8 reps, closed chain lateral step ups and SLS   Abduction Limitations  Closed chain hip ABD with lateral step ups on 2" step with SLS at top of step up attempting to release UE support; 8 reps each side, min A overall               PT Short Term Goals - 05/26/19 1449      PT SHORT TERM GOAL #1   Title  Pt will participate in further assessment of falls risk and endurance with DGI and 2 minute walk test of endurance    Baseline  DGI 9/24 and 2 minute walk test = 201 feet both performed with 2WRW to 26' with RW on 2MWT and 15/24 without AD on 05/26/19    Time  4    Period  Weeks    Status  Achieved    Target Date  05/26/19      PT SHORT TERM GOAL #2   Title  Pt will demonstrate independence with initial HEP    Baseline  met except for corner balance exercises added last visit.    Time  4    Period  Weeks    Status  Partially Met    Target Date  05/26/19      PT SHORT TERM GOAL #3   Title  Pt will demonstrate improvement in LE strength as indicated by improvement in five time sit to stand from chair with use of UE to </= 28 seconds    Baseline  32.94 to 8.22 secs without UE support  from standard arm chair    Time  4    Period  Weeks    Status  Achieved    Target Date  05/26/19      PT SHORT TERM GOAL #4   Title  Pt will improve gait velocity with RW to >/= 1.8 ft/sec    Baseline  1.12 ft/sec to 2.89 ft/sec with RW    Time  4    Period  Weeks    Status  Achieved    Target Date  05/26/19      PT SHORT TERM GOAL #5   Title  Pt will negotiate 4 stairs with 2 rails, alternating sequence Mod I    Baseline  met with single rail on 05/26/19    Time  4    Period  Weeks    Status  Achieved    Target Date  05/26/19        PT Long Term Goals - 06/13/19 1256      PT LONG TERM GOAL #1   Title  Pt will be independent with final HEP (All LTGs due 06/25/19)    Time  8    Period  Weeks    Status  New      PT LONG TERM GOAL #2   Title  Pt will improve 6MWT distance to >/=993' with LRAD in order to indicate improved endurance.    Time  8    Period  Weeks  Status  New      PT LONG TERM GOAL #3   Title  Pt will improve gait speed to >/= 2.62 ft/sec with SPC.    Time  8    Period  Weeks    Status  Revised      PT LONG TERM GOAL #4   Title  Pt will improve DGI to >/=18/24 without AD to indicate decreased falls when ambulating in the community    Time  8    Period  Weeks    Status  New      PT LONG TERM GOAL #5   Title  Pt will ambulate 500' over unlevel outdoor paved surfaces w/ SPC at mod I level in order to indicate improved community mobility.    Time  8    Period  Weeks    Status  New            Plan - 06/16/19 1303    Clinical Impression Statement  Initiated session with hip flexor stretching with pt demonstrating improvement in ROM with ability to touch floor with foot; added knee flexion to increase stretch.  While in extended position performed single leg terminal hip extension strengthening and continued to progress aerobic conditioning on SciFit.  Pt also demonstrated improved closed chain hip ABD strength today and demonstrated ability to  activate on R side and maintain SLS without UE support.  Will continue to progress twoards LTG.    Personal Factors and Comorbidities  Comorbidity 3+;Fitness;Past/Current Experience    Comorbidities  NSTEMI, HTN, HLD, chronic back pain with multiple surgeries and spinal cord stimulator, CAD, ischemic cardiomyopathy with coronary angioplasty with stent placement, L ventricular aneurysm, type 2 DM, obesity, R hip OA with R THA    Examination-Activity Limitations  Bed Mobility;Bend;Dressing;Lift;Locomotion Level;Stairs;Stand    Examination-Participation Restrictions  Cleaning;Community Activity    Stability/Clinical Decision Making  Evolving/Moderate complexity    Rehab Potential  Good    PT Frequency  2x / week    PT Duration  8 weeks    PT Treatment/Interventions  ADLs/Self Care Home Management;Aquatic Therapy;Cryotherapy;Moist Heat;DME Instruction;Gait training;Stair training;Functional mobility training;Therapeutic activities;Therapeutic exercise;Balance training;Neuromuscular re-education;Patient/family education;Orthotic Fit/Training;Scar mobilization;Passive range of motion;Dry needling;Taping;Manual techniques    PT Next Visit Plan  ADD 4 MORE WEEKS OF VISITS; CHECK LTG AND RECERT ON 4/66; Continue hip flexor stretch on taller mat, terminal hip extension strength, hip abd strength-step ups on shorter step, SLS; continue balance, compliant surfaces, EC, gait with SPC as able,  PATIENT WEARS TLSO, BACK PRECAUTIONS. add seated hamstring stretch to HEP, (and more) isometric strengthening; initiate standing balance and endurance activities, R hip strengthening    PT Home Exercise Plan  [id number]    Consulted and Agree with Plan of Care  Patient       Patient will benefit from skilled therapeutic intervention in order to improve the following deficits and impairments:  Decreased activity tolerance, Decreased balance, Decreased endurance, Decreased mobility, Decreased range of motion, Decreased  strength, Difficulty walking, Increased edema, Impaired sensation, Postural dysfunction, Pain  Visit Diagnosis: Muscle weakness (generalized)  Abnormal posture  Chronic bilateral low back pain, unspecified whether sciatica present  Difficulty in walking, not elsewhere classified  Repeated falls  Other disturbances of skin sensation  Muscle spasm of back     Problem List Patient Active Problem List   Diagnosis Date Noted  . Chronic low back pain 02/09/2019  . Spinal stenosis of lumbar region with neurogenic claudication 11/22/2018  . Radiculopathy of lumbar  region 05/31/2017  . HCAP (healthcare-associated pneumonia) 05/30/2017  . Leukocytosis 05/30/2017  . Lumbar pseudoarthrosis 05/27/2017  . Unstable angina ([place]) 02/18/2017  . Primary localized osteoarthritis of right hip 10/03/2016  . Chest pain, rule out acute myocardial infarction 05/01/2016  . Diabetes mellitus type 2, controlled, with complications ([place]) 50/72/2575  . Obesity (BMI 30-39.9) 05/01/2016  . Left ventricular aneurysm 12/03/2015  . Ischemic cardiomyopathy   . CAD (coronary artery disease) 07/16/2015  . Hypokalemia 07/16/2015  . History of non-ST elevation myocardial infarction (NSTEMI) 07/15/2015  . Hypertension 07/15/2015  . Hyperlipidemia with target LDL less than 100 07/15/2015  . Back pain 07/15/2015  . GERD (gastroesophageal reflux disease) 06/18/2012  . Chronic radicular low back pain 06/18/2012    Rico Junker, PT, DPT 06/16/19    1:09 PM    [facility] [address] [place], [place], [address] Phone: [phone]   Fax:  [phone]  Name: LUCUS LAMBERTSON MRN: [id number] Date of Birth: 1955/12/22

## 2019-06-20 ENCOUNTER — Other Ambulatory Visit: Payer: Self-pay

## 2019-06-20 ENCOUNTER — Ambulatory Visit: Payer: Medicare Other | Admitting: Physical Therapy

## 2019-06-20 DIAGNOSIS — R262 Difficulty in walking, not elsewhere classified: Secondary | ICD-10-CM

## 2019-06-20 DIAGNOSIS — M6281 Muscle weakness (generalized): Secondary | ICD-10-CM | POA: Diagnosis not present

## 2019-06-20 DIAGNOSIS — R208 Other disturbances of skin sensation: Secondary | ICD-10-CM

## 2019-06-20 DIAGNOSIS — R296 Repeated falls: Secondary | ICD-10-CM

## 2019-06-20 DIAGNOSIS — R293 Abnormal posture: Secondary | ICD-10-CM

## 2019-06-20 DIAGNOSIS — G8929 Other chronic pain: Secondary | ICD-10-CM

## 2019-06-21 NOTE — Therapy (Signed)
[facility] [address] [name], [place], [address] Phone: [phone]   Fax:  [phone]  Physical Therapy Treatment  Patient Details  Name: Thomas Mcclure MRN: [id number] Date of Birth: 1955/07/10 Referring Provider (PT): Newman Pies, MD   Encounter Date: 06/20/2019  PT End of Session - 06/21/19 1415    Visit Number  14    Number of Visits  17    Date for PT Re-Evaluation  06/25/19    Authorization Type  [place] - 10th visit PN.  $0 copay as long as Medicaid is active.  VL: MN    Progress Note Due on Visit  20    PT Start Time  1152    PT Stop Time  1230    PT Time Calculation (min)  38 min    Equipment Utilized During Treatment  Back brace    Activity Tolerance  Patient tolerated treatment well    Behavior During Therapy  WFL for tasks assessed/performed       Past Medical History:  Diagnosis Date  . Baker's cyst    Left calf  . CAD in native artery, 07/15/15 PCI of RCA with DES 07/16/2015   a.   NSTEMI 5/17: LHC - pLAD 20, pLCx 20, OM1 30, pRCA 100, EF 45-50%>> PCI: 2.5 x 24 mm Promus DES to RCA  //  b.   Echo 5/17: mild LVH, EF 50-55%, no RWMA, mod RVE //  c. LHC 6/17: pLAD 20, pLCx 20, OM1 50, pRCA stent ok, EF 35-45% with mod sized inf wall and basal segment aneurysm  . CHF (congestive heart failure) ([place])   . Chronic back pain    "down my back, down my legs" (02/18/2017)  . Constipation   . Constipation due to opioid therapy   . GERD (gastroesophageal reflux disease)    04/07/2019- not current  . Hyperlipidemia   . Hypertension    Dr. [name] [phone]  . Ischemic cardiomyopathy    a. LV-gram at time of LHC in 6/17 with EF 35-45%  //  b. Echo 7/17: EF 45-50%, inferior HK, grade 1 diastolic dysfunction, mildly dilated aortic root, moderately reduced RVSF, mild RAE  . Myocardial infarction (Thomas Mcclure) 2017  . 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 Numbness and tingling of both  lower extremities    "on the outside of both sides" (02/18/2017)  . Rheumatoid arthritis (Thomas Mcclure)    RA  . Tachycardia   . Type II diabetes mellitus (Thomas Mcclure)    diet controlled    Past Surgical History:  Procedure Laterality Date  . BACK SURGERY     x3  . CARDIAC CATHETERIZATION N/A 07/15/2015   Procedure: Left Heart Cath and Coronary Angiography;  Surgeon: [name], MD;  Location: [facility];  Service: Cardiovascular;  Laterality: N/A;  . CARDIAC CATHETERIZATION N/A 07/15/2015   Procedure: Coronary Stent Intervention;  Surgeon: [name], MD;  Location: [facility];  Service: Cardiovascular;  Laterality: N/A;  . CARDIAC CATHETERIZATION N/A 08/07/2015   Procedure: Left Heart Cath and Coronary Angiography;  Surgeon: [name], MD;  Location: [facility];  Service: Cardiovascular;  Laterality: N/A;  . CORONARY ANGIOPLASTY WITH STENT PLACEMENT  07/2015  . LEFT HEART CATH AND CORONARY ANGIOGRAPHY N/A 02/19/2017   Procedure: LEFT HEART CATH AND CORONARY ANGIOGRAPHY;  Surgeon: Martinique, Peter M, MD;  Location: [facility];  Service: Cardiovascular;  Laterality: N/A;  . LUMBAR FUSION  04/11/2019   REVISION OF THORACOLUMBAR FUSION   . LUMBAR SPINE SURGERY  03/2009  & 2012  . MASS EXCISION N/A 04/19/2012   Procedure: removal of posterior cervical lipoma;  Surgeon: Thomas Charter, MD;  Location: [facility];  Service: Neurosurgery;  Laterality: N/A;  Removal of posterior cervical lipoma  . POPLITEAL SYNOVIAL CYST EXCISION Left    "opened up behind my knee; scraped out arthritis"  . POSTERIOR LUMBAR FUSION 4 LEVEL N/A 11/22/2018   Procedure: Posterior Lateral and Interbody fusion - Lumbar one-Lumbar two; explore fusion, posterior instrumentation and fusion Thoracic ten to the ilium;  Surgeon: Newman Pies, MD;  Location: [facility];  Service: Neurosurgery;  Laterality: N/A;  . SPINAL CORD STIMULATOR INSERTION N/A 01/07/2013   Procedure:  SPINAL CORD STIMULATOR  INSERTION;  Surgeon: Thomas Gains, MD;  Location: [facility];  Service: Neurosurgery;  Laterality: N/A;  . SPINAL CORD STIMULATOR INSERTION N/A 02/09/2019   Procedure: REPLACEMENT OF LUMBAR SPINAL CORD STIMULATOR;  Surgeon: Newman Pies, MD;  Location: [facility];  Service: Neurosurgery;  Laterality: N/A;  Thoracic/Lumbar spine  . TONSILLECTOMY     patient denies  . TOTAL HIP ARTHROPLASTY Right 10/03/2016   Procedure: TOTAL HIP ARTHROPLASTY;  Surgeon: Thomas Server, MD;  Location: [facility];  Service: Orthopedics;  Laterality: Right;  . WISDOM TOOTH EXTRACTION     hx of    There were no vitals filed for this visit.  Subjective Assessment - 06/20/19 1157    Subjective  A little fatigued from running around this morning.  Had a good weekend.  A little sore in LE from step ups    Pertinent History  NSTEMI, HTN, HLD, chronic back pain with multiple surgeries and spinal cord stimulator, CAD, ischemic cardiomyopathy with coronary angioplasty with stent placement, L ventricular aneurysm, type 2 DM, obesity, R hip OA with R THA    Currently in Pain?  No/denies                       [facility] Adult PT Treatment/Exercise - 06/20/19 1217      Bed Mobility   Bed Mobility  Supine to Sit;Sit to Supine    Supine to Sit  Independent with assistive device    Sit to Supine  Independent with assistive device      Transfers   Transfers  Sit to Stand;Stand to Sit;Stand Pivot Transfers    Sit to Stand  6: Modified independent (Device/Increase time)    Stand to Sit  6: Modified independent (Device/Increase time)    Stand Pivot Transfers  6: Modified independent (Device/Increase time)      Lumbar Exercises: Stretches   Active Hamstring Stretch  Right;Left;Other (comment)   10 reps   Active Hamstring Stretch Limitations  supine, knee flexion<>extension with hip flexed to 90, also added ankle DF<>PF    Passive Hamstring Stretch  Right;Left;1 rep    Passive Hamstring Stretch Limitations   Supine, held for 2 minutes each side    Hip Flexor Stretch  Right;Left;1 rep;Other (comment)    Hip Flexor Stretch Limitations  supine, LE off side of mat x 2 minutes each side          Balance Exercises - 06/20/19 1228      Balance Exercises: Standing   Tandem Stance  Eyes open;Foam/compliant surface;Intermittent upper extremity support;4 reps;15 secs    Tandem Gait  Forward;Intermittent upper extremity support;Foam/compliant surface;4 reps  Sidestepping  3 reps   to L and R along blue foam balance beam       PT Education - 06/21/19 1414    Education Details  more visits added, will perform assessment and recertification next visit    Person(s) Educated  Patient    Methods  Explanation    Comprehension  Verbalized understanding       PT Short Term Goals - 05/26/19 1449      PT SHORT TERM GOAL #1   Title  Pt will participate in further assessment of falls risk and endurance with DGI and 2 minute walk test of endurance    Baseline  DGI 9/24 and 2 minute walk test = 201 feet both performed with 2WRW to 27' with RW on 2MWT and 15/24 without AD on 05/26/19    Time  4    Period  Weeks    Status  Achieved    Target Date  05/26/19      PT SHORT TERM GOAL #2   Title  Pt will demonstrate Thomas [place] with initial HEP    Baseline  met except for corner balance exercises added last visit.    Time  4    Period  Weeks    Status  Partially Met    Target Date  05/26/19      PT SHORT TERM GOAL #3   Title  Pt will demonstrate improvement in LE strength as indicated by improvement in five time sit to stand from chair with use of UE to </= 28 seconds    Baseline  32.94 to 8.22 secs without UE support from standard arm chair    Time  4    Period  Weeks    Status  Achieved    Target Date  05/26/19      PT SHORT TERM GOAL #4   Title  Pt will improve gait velocity with RW to >/= 1.8 ft/sec    Baseline  1.12 ft/sec to 2.89 ft/sec with RW    Time  4    Period  Weeks    Status   Achieved    Target Date  05/26/19      PT SHORT TERM GOAL #5   Title  Pt will negotiate 4 stairs with 2 rails, alternating sequence Mod I    Baseline  met with single rail on 05/26/19    Time  4    Period  Weeks    Status  Achieved    Target Date  05/26/19        PT Long Term Goals - 06/13/19 1256      PT LONG TERM GOAL #1   Title  Pt will be independent with final HEP (All LTGs due 06/25/19)    Time  8    Period  Weeks    Status  New      PT LONG TERM GOAL #2   Title  Pt will improve 6MWT distance to >/=993' with LRAD in order to indicate improved endurance.    Time  8    Period  Weeks    Status  New      PT LONG TERM GOAL #3   Title  Pt will improve gait speed to >/= 2.62 ft/sec with SPC.    Time  8    Period  Weeks    Status  Revised      PT LONG TERM GOAL #4   Title  Pt will improve DGI to >/=18/24  without AD to indicate decreased falls when ambulating in the community    Time  8    Period  Weeks    Status  New      PT LONG TERM GOAL #5   Title  Pt will ambulate 500' over unlevel outdoor paved surfaces w/ SPC at mod I level in order to indicate improved community mobility.    Time  8    Period  Weeks    Status  New            Plan - 06/21/19 1415    Clinical Impression Statement  Progressed hip flexor stretching by performing on taller table and added hamstring stretching; pt demonstrates decreased ROM of hip flexors on R, but more limited hamstring ROM on LLE.   Continued glute med strengthening and balance training with balance activities with narrow BOS and side stepping on compliant foam to increase use of hip strategies.  Pt tolerated well with no increase in back pain.    Personal Factors and Comorbidities  Comorbidity 3+;Fitness;Past/Current Experience    Comorbidities  NSTEMI, HTN, HLD, chronic back pain with multiple surgeries and spinal cord stimulator, CAD, ischemic cardiomyopathy with coronary angioplasty with stent placement, L ventricular  aneurysm, type 2 DM, obesity, R hip OA with R THA    Examination-Activity Limitations  Bed Mobility;Bend;Dressing;Lift;Locomotion Level;Stairs;Stand    Examination-Participation Restrictions  Cleaning;Community Activity    Stability/Clinical Decision Making  Evolving/Moderate complexity    Rehab Potential  Good    PT Frequency  2x / week    PT Duration  8 weeks    PT Treatment/Interventions  ADLs/Self Care Home Management;Aquatic Therapy;Cryotherapy;Moist Heat;DME Instruction;Gait training;Stair training;Functional mobility training;Therapeutic activities;Therapeutic exercise;Balance training;Neuromuscular re-education;Patient/family education;Orthotic Fit/Training;Scar mobilization;Passive range of motion;Dry needling;Taping;Manual techniques    PT Next Visit Plan  CHECK LTG AND RECERT ON 7/90; Continue hip flexor stretch on taller mat, terminal hip extension strength, hip abd strength-step ups on shorter step, SLS; continue balance, compliant surfaces, EC, gait with SPC as able,  PATIENT WEARS TLSO, BACK PRECAUTIONS. add seated hamstring stretch to HEP, (and more) isometric strengthening; initiate standing balance and endurance activities, R hip strengthening    PT Home Exercise Plan  [id number]    Consulted and Agree with Plan of Care  Patient       Patient will benefit from skilled therapeutic intervention in order to improve the following deficits and impairments:  Decreased activity tolerance, Decreased balance, Decreased endurance, Decreased mobility, Decreased range of motion, Decreased strength, Difficulty walking, Increased edema, Impaired sensation, Postural dysfunction, Pain  Visit Diagnosis: Muscle weakness (generalized)  Abnormal posture  Chronic bilateral low back pain, unspecified whether sciatica present  Difficulty in walking, not elsewhere classified  Repeated falls  Other disturbances of skin sensation     Problem List Patient Active Problem List   Diagnosis Date  Noted  . Chronic low back pain 02/09/2019  . Spinal stenosis of lumbar region with neurogenic claudication 11/22/2018  . Radiculopathy of lumbar region 05/31/2017  . HCAP (healthcare-associated pneumonia) 05/30/2017  . Leukocytosis 05/30/2017  . Lumbar pseudoarthrosis 05/27/2017  . Unstable angina ([place]) 02/18/2017  . Primary localized osteoarthritis of right hip 10/03/2016  . Chest pain, rule out acute myocardial infarction 05/01/2016  . Diabetes mellitus type 2, controlled, with complications ([facility]) 29/92/4268  . Obesity (BMI 30-39.9) 05/01/2016  . Left ventricular aneurysm 12/03/2015  . Ischemic cardiomyopathy   . CAD (coronary artery disease) 07/16/2015  . Hypokalemia 07/16/2015  . History of non-ST elevation myocardial  infarction (NSTEMI) 07/15/2015  . Hypertension 07/15/2015  . Hyperlipidemia with target LDL less than 100 07/15/2015  . Back pain 07/15/2015  . GERD (gastroesophageal reflux disease) 06/18/2012  . Chronic radicular low back pain 06/18/2012    Rico Junker, PT, DPT 06/21/19    2:24 PM    [facility] [address] [place], [place], [address] Phone: [phone]   Fax:  [phone]  Name: NGHIA MCENTEE MRN: [id number] Date of Birth: 11/07/1955

## 2019-06-23 ENCOUNTER — Other Ambulatory Visit: Payer: Self-pay

## 2019-06-23 ENCOUNTER — Encounter: Payer: Self-pay | Admitting: Physical Therapy

## 2019-06-23 ENCOUNTER — Ambulatory Visit: Payer: Medicare Other | Admitting: Physical Therapy

## 2019-06-23 DIAGNOSIS — R296 Repeated falls: Secondary | ICD-10-CM

## 2019-06-23 DIAGNOSIS — G8929 Other chronic pain: Secondary | ICD-10-CM

## 2019-06-23 DIAGNOSIS — R208 Other disturbances of skin sensation: Secondary | ICD-10-CM

## 2019-06-23 DIAGNOSIS — M6281 Muscle weakness (generalized): Secondary | ICD-10-CM

## 2019-06-23 DIAGNOSIS — R293 Abnormal posture: Secondary | ICD-10-CM

## 2019-06-23 DIAGNOSIS — R262 Difficulty in walking, not elsewhere classified: Secondary | ICD-10-CM

## 2019-06-23 NOTE — Therapy (Signed)
[facility] [address] [place], [place], [address] Phone: [phone]   Fax:  [phone]  Physical Therapy Treatment  Patient Details  Name: Thomas Mcclure MRN: [id number] Date of Birth: 06-04-1955 Referring Provider (PT): Newman Pies, MD   Encounter Date: 06/23/2019  PT End of Session - 06/23/19 1144    Visit Number  15    Number of Visits  17    Date for PT Re-Evaluation  06/25/19    Authorization Type  [place] - 10th visit PN.  $0 copay as long as Medicaid is active.  VL: MN    Progress Note Due on Visit  20    PT Start Time  1100    PT Stop Time  1145    PT Time Calculation (min)  45 min    Equipment Utilized During Treatment  Back brace    Activity Tolerance  Patient tolerated treatment well    Behavior During Therapy  WFL for tasks assessed/performed       Past Medical History:  Diagnosis Date  . Baker's cyst    Left calf  . CAD in native artery, 07/15/15 PCI of RCA with DES 07/16/2015   a.   NSTEMI 5/17: LHC - pLAD 20, pLCx 20, OM1 30, pRCA 100, EF 45-50%>> PCI: 2.5 x 24 mm Promus DES to RCA  //  b.   Echo 5/17: mild LVH, EF 50-55%, no RWMA, mod RVE //  c. LHC 6/17: pLAD 20, pLCx 20, OM1 50, pRCA stent ok, EF 35-45% with mod sized inf wall and basal segment aneurysm  . CHF (congestive heart failure) ([facility])   . Chronic back pain    "down my back, down my legs" (02/18/2017)  . Constipation   . Constipation due to opioid therapy   . GERD (gastroesophageal reflux disease)    04/07/2019- not current  . Hyperlipidemia   . Hypertension    Dr. [name] [phone]  . Ischemic cardiomyopathy    a. LV-gram at time of LHC in 6/17 with EF 35-45%  //  b. Echo 7/17: EF 45-50%, inferior HK, grade 1 diastolic dysfunction, mildly dilated aortic root, moderately reduced RVSF, mild RAE  . Myocardial infarction ([facility]) 2017  . Neuromuscular disorder ([facility])    "with nerve damage"  . Numbness and tingling of both  lower extremities    "on the outside of both sides" (02/18/2017)  . Rheumatoid arthritis ([facility])    RA  . Tachycardia   . Type II diabetes mellitus ([facility])    diet controlled    Past Surgical History:  Procedure Laterality Date  . BACK SURGERY     x3  . CARDIAC CATHETERIZATION N/A 07/15/2015   Procedure: Left Heart Cath and Coronary Angiography;  Surgeon: [name], MD;  Location: [facility];  Service: Cardiovascular;  Laterality: N/A;  . CARDIAC CATHETERIZATION N/A 07/15/2015   Procedure: Coronary Stent Intervention;  Surgeon: [name], MD;  Location: [facility];  Service: Cardiovascular;  Laterality: N/A;  . CARDIAC CATHETERIZATION N/A 08/07/2015   Procedure: Left Heart Cath and Coronary Angiography;  Surgeon: [name], MD;  Location: [facility];  Service: Cardiovascular;  Laterality: N/A;  . CORONARY ANGIOPLASTY WITH STENT PLACEMENT  07/2015  . LEFT HEART CATH AND CORONARY ANGIOGRAPHY N/A 02/19/2017   Procedure: LEFT HEART CATH AND CORONARY ANGIOGRAPHY;  Surgeon: Martinique, Peter M, MD;  Location: [facility];  Service: Cardiovascular;  Laterality: N/A;  . LUMBAR FUSION  04/11/2019   REVISION OF THORACOLUMBAR FUSION   . LUMBAR SPINE SURGERY  03/2009  & 2012  . MASS EXCISION N/A 04/19/2012   Procedure: removal of posterior cervical lipoma;  Surgeon: [name], MD;  Location: [facility];  Service: Neurosurgery;  Laterality: N/A;  Removal of posterior cervical lipoma  . POPLITEAL SYNOVIAL CYST EXCISION Left    "opened up behind my knee; scraped out arthritis"  . POSTERIOR LUMBAR FUSION 4 LEVEL N/A 11/22/2018   Procedure: Posterior Lateral and Interbody fusion - Lumbar one-Lumbar two; explore fusion, posterior instrumentation and fusion Thoracic ten to the ilium;  Surgeon: Newman Pies, MD;  Location: [facility];  Service: Neurosurgery;  Laterality: N/A;  . SPINAL CORD STIMULATOR INSERTION N/A 01/07/2013   Procedure:  SPINAL CORD STIMULATOR  INSERTION;  Surgeon: [name], MD;  Location: [facility];  Service: Neurosurgery;  Laterality: N/A;  . SPINAL CORD STIMULATOR INSERTION N/A 02/09/2019   Procedure: REPLACEMENT OF LUMBAR SPINAL CORD STIMULATOR;  Surgeon: Newman Pies, MD;  Location: [facility];  Service: Neurosurgery;  Laterality: N/A;  Thoracic/Lumbar spine  . TONSILLECTOMY     patient denies  . TOTAL HIP ARTHROPLASTY Right 10/03/2016   Procedure: TOTAL HIP ARTHROPLASTY;  Surgeon: [name], MD;  Location: [facility];  Service: Orthopedics;  Laterality: Right;  . WISDOM TOOTH EXTRACTION     hx of    There were no vitals filed for this visit.  Subjective Assessment - 06/23/19 1108    Subjective  Has had significant leg pain since last session x 2 days; not cramps or sore, "just hurt!".  Wants to take it easy today since he has to travel tomorrow.    Pertinent History  NSTEMI, HTN, HLD, chronic back pain with multiple surgeries and spinal cord stimulator, CAD, ischemic cardiomyopathy with coronary angioplasty with stent placement, L ventricular aneurysm, type 2 DM, obesity, R hip OA with R THA    Currently in Pain?  Yes         [facility] PT Assessment - 06/23/19 1120      Assessment   Medical Diagnosis  thoracolumbar fusion revision    Referring Provider (PT)  Newman Pies, MD    Onset Date/Surgical Date  04/12/19    Prior Therapy  yes      Precautions   Precautions  Back;Other (comment)    Precaution Comments  NSTEMI, HTN, HLD, chronic back pain with multiple surgeries and spinal cord stimulator, CAD, ischemic cardiomyopathy with coronary angioplasty with stent placement, L ventricular aneurysm, type 2 DM, obesity, R hip OA with R THA    Required Braces or Orthoses  Spinal Brace    Spinal Brace  Thoracolumbosacral orthotic;Applied in sitting position      Prior Function   Level of Independence  Independent with household mobility with device;Independent with basic ADLs;Independent with homemaking with  ambulation;Independent with transfers    Vocation  Other (comment);Retired    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      Observation/Other Assessments   Focus on Therapeutic Outcomes Human resources officer)   Not assessed      Ambulation/Gait   Ambulation/Gait  Yes    Ambulation/Gait Assistance  6: Modified independent (Device/Increase time)    Ambulation/Gait Assistance Details  with TLSO, no RW shorter distances and then with RW for longer distances    Ambulation Distance (Feet)  Ebro  device  Rolling walker;None    Gait Pattern  Trendelenburg;Trunk flexed    Ambulation Surface  Level;Indoor    Stairs  Yes    Stairs Assistance  6: Modified independent (Device/Increase time)    Stair Management Technique  Two rails;Alternating pattern;Forwards    Number of Stairs  8    Height of Stairs  6      6 Minute Walk- Baseline   6 Minute Walk- Baseline  yes    BP (mmHg)  (!) 139/91    HR (bpm)  99    02 Sat (%RA)  99 %    Modified Borg Scale for Dyspnea  0- Nothing at all    Perceived Rate of Exertion (Borg)  6-      6 Minute walk- Post Test   6 Minute Walk Post Test  yes    BP (mmHg)  164/90    HR (bpm)  113    02 Sat (%RA)  98 %    Modified Borg Scale for Dyspnea  2- Mild shortness of breath    Perceived Rate of Exertion (Borg)  13- Somewhat hard      6 minute walk test results    Aerobic Endurance Distance Walked  1189    Endurance additional comments  with RW, no rest breaks      Standardized Balance Assessment   10 Meter Walk  12.87 seconds without AD but with TLSO; 2.54 ft/sec       Dynamic Gait Index   Level Surface  Mild Impairment    Change in Gait Speed  Normal    Gait with Horizontal Head Turns  Mild Impairment    Gait with Vertical Head Turns  Normal    Gait and Pivot Turn  Normal    Step Over Obstacle  Normal    Step Around Obstacles  Normal    Steps  Mild Impairment    Total Score  21    DGI comment:  21/24                            PT Education - 06/23/19 1652    Education Details  Progress towards goals    Person(s) Educated  Patient    Methods  Explanation    Comprehension  Verbalized understanding       PT Short Term Goals - 05/26/19 1449      PT SHORT TERM GOAL #1   Title  Pt will participate in further assessment of falls risk and endurance with DGI and 2 minute walk test of endurance    Baseline  DGI 9/24 and 2 minute walk test = 201 feet both performed with 2WRW to 25' with RW on 2MWT and 15/24 without AD on 05/26/19    Time  4    Period  Weeks    Status  Achieved    Target Date  05/26/19      PT SHORT TERM GOAL #2   Title  Pt will demonstrate independence with initial HEP    Baseline  met except for corner balance exercises added last visit.    Time  4    Period  Weeks    Status  Partially Met    Target Date  05/26/19      PT SHORT TERM GOAL #3   Title  Pt will demonstrate improvement in LE strength as indicated by improvement in five time sit to stand from chair with use  of UE to </= 28 seconds    Baseline  32.94 to 8.22 secs without UE support from standard arm chair    Time  4    Period  Weeks    Status  Achieved    Target Date  05/26/19      PT SHORT TERM GOAL #4   Title  Pt will improve gait velocity with RW to >/= 1.8 ft/sec    Baseline  1.12 ft/sec to 2.89 ft/sec with RW    Time  4    Period  Weeks    Status  Achieved    Target Date  05/26/19      PT SHORT TERM GOAL #5   Title  Pt will negotiate 4 stairs with 2 rails, alternating sequence Mod I    Baseline  met with single rail on 05/26/19    Time  4    Period  Weeks    Status  Achieved    Target Date  05/26/19        PT Long Term Goals - 06/23/19 1111      PT LONG TERM GOAL #1   Title  Pt will be independent with final HEP (All LTGs due 06/25/19)    Time  8    Period  Weeks    Status  Achieved      PT LONG TERM GOAL #2   Title  Pt will improve 6MWT distance to >/=993' with  LRAD in order to indicate improved endurance.    Baseline  1189'    Time  8    Period  Weeks    Status  Achieved      PT LONG TERM GOAL #3   Title  Pt will improve gait speed to >/= 2.62 ft/sec with SPC.    Time  8    Period  Weeks    Status  Partially Met      PT LONG TERM GOAL #4   Title  Pt will improve DGI to >/=18/24 without AD to indicate decreased falls when ambulating in the community    Baseline  21/24    Time  8    Period  Weeks    Status  Achieved      PT LONG TERM GOAL #5   Title  Pt will ambulate 500' over unlevel outdoor paved surfaces w/ SPC at mod I level in order to indicate improved community mobility.    Time  8    Period  Weeks    Status  On-going      PT LONG TERM GOAL #6   Title  Pt will negotiate 4 stairs with cane and one rail MOD I    Time  8    Period  Weeks    Status  On-going      New goals for recertification:  PT Short Term Goals - 06/23/19 1654      PT SHORT TERM GOAL #1   Title  Pt will demonstrate independence with ongoing and progressed HEP    Time  4    Period  Weeks    Status  New    Target Date  07/25/19      PT SHORT TERM GOAL #2   Title  Pt will improve DGI without AD by 2 points    Baseline  21/24    Time  4    Period  Weeks    Status  Revised    Target Date  07/25/19  PT SHORT TERM GOAL #3   Title  Pt will improve gait velocity without AD to >/= 2.62 ft/sec    Baseline  2.54 ft/sec without AD    Time  4    Period  Weeks    Status  New    Target Date  07/25/19      PT SHORT TERM GOAL #4   Title  Pt will negotiate 8 stairs with one rail, alternating sequence with supervision    Time  4    Period  Weeks    Status  Revised    Target Date  07/25/19      PT Long Term Goals - 06/23/19 1658      PT LONG TERM GOAL #1   Title  Pt will be independent with final HEP    Time  8    Period  Weeks    Status  Revised    Target Date  08/24/19      PT LONG TERM GOAL #2   Title  Pt will improve 6MWT distance to >/=  1200' with LRAD in order to indicate improved endurance.    Baseline  1189'    Time  8    Period  Weeks    Status  Revised    Target Date  08/24/19      PT LONG TERM GOAL #3   Title  Pt will improve gait speed to >/= 3.0 ft/sec without AD    Time  8    Period  Weeks    Status  Revised    Target Date  08/24/19      PT LONG TERM GOAL #4   Title  Pt will improve DGI to >/=23/24 without AD to indicate decreased falls when ambulating in the community    Baseline  21/24    Time  8    Period  Weeks    Status  Achieved    Target Date  08/24/19      PT LONG TERM GOAL #5   Title  Pt will ambulate 500' over unlevel outdoor paved surfaces w/ SPC at mod I level in order to indicate improved community mobility.    Time  8    Period  Weeks    Status  On-going    Target Date  08/24/19         Plan - 06/23/19 1145    Clinical Impression Statement  Pt is making good progress towards goals.  He has met 3/6 LTG, partially met 1 goal with outdoor gait goal and stair goal ongoing - did not formally assess today due to time.  Pt demonstrates increased gait velocity without use of AD, significant improvement in balance during gait without AD and improved functional endurance.  Pt continues to present with decreased LE, core and back strength, impaired posture, impaired balance and gait.  Pt would benefit from continued skilled PT services to address these impairments to maximize functional mobility independence and decrease falls risk.    Personal Factors and Comorbidities  Comorbidity 3+;Fitness;Past/Current Experience    Comorbidities  NSTEMI, HTN, HLD, chronic back pain with multiple surgeries and spinal cord stimulator, CAD, ischemic cardiomyopathy with coronary angioplasty with stent placement, L ventricular aneurysm, type 2 DM, obesity, R hip OA with R THA    Examination-Activity Limitations  Bed Mobility;Bend;Dressing;Lift;Locomotion Level;Stairs;Stand    Examination-Participation Restrictions   Cleaning;Community Activity    Stability/Clinical Decision Making  Evolving/Moderate complexity    Rehab Potential  Good  PT Frequency  2x / week    PT Duration  8 weeks    PT Treatment/Interventions  ADLs/Self Care Home Management;Aquatic Therapy;Cryotherapy;Moist Heat;DME Instruction;Gait training;Stair training;Functional mobility training;Therapeutic activities;Therapeutic exercise;Balance training;Neuromuscular re-education;Patient/family education;Orthotic Fit/Training;Scar mobilization;Passive range of motion;Dry needling;Taping;Manual techniques    PT Next Visit Plan  UPDATE HEP due to progress.  Endurance on SCI FIT - increase to 12 minutes.  terminal hip extension strength, hip abd strength-step ups on shorter step, SLS; continue balance, compliant surfaces, EC, gait with SPC as able,  PATIENT WEARS TLSO, BACK PRECAUTIONS.    PT Home Exercise Plan  [email or web address]    Consulted and Agree with Plan of Care  Patient       Patient will benefit from skilled therapeutic intervention in order to improve the following deficits and impairments:  Decreased activity tolerance, Decreased balance, Decreased endurance, Decreased mobility, Decreased range of motion, Decreased strength, Difficulty walking, Increased edema, Impaired sensation, Postural dysfunction, Pain  Visit Diagnosis: Muscle weakness (generalized)  Abnormal posture  Chronic bilateral low back pain, unspecified whether sciatica present  Difficulty in walking, not elsewhere classified  Repeated falls  Other disturbances of skin sensation     Problem List Patient Active Problem List   Diagnosis Date Noted  . Chronic low back pain 02/09/2019  . Spinal stenosis of lumbar region with neurogenic claudication 11/22/2018  . Radiculopathy of lumbar region 05/31/2017  . HCAP (healthcare-associated pneumonia) 05/30/2017  . Leukocytosis 05/30/2017  . Lumbar pseudoarthrosis 05/27/2017  . Unstable angina ([facility]) 02/18/2017  . Primary  localized osteoarthritis of right hip 10/03/2016  . Chest pain, rule out acute myocardial infarction 05/01/2016  . Diabetes mellitus type 2, controlled, with complications ([facility]) 78/29/5621  . Obesity (BMI 30-39.9) 05/01/2016  . Left ventricular aneurysm 12/03/2015  . Ischemic cardiomyopathy   . CAD (coronary artery disease) 07/16/2015  . Hypokalemia 07/16/2015  . History of non-ST elevation myocardial infarction (NSTEMI) 07/15/2015  . Hypertension 07/15/2015  . Hyperlipidemia with target LDL less than 100 07/15/2015  . Back pain 07/15/2015  . GERD (gastroesophageal reflux disease) 06/18/2012  . Chronic radicular low back pain 06/18/2012    Rico Junker, PT, DPT 06/23/19    4:52 PM    [facility] [address] [place], [place], [address] Phone: [phone]   Fax:  [phone]  Name: Thomas Mcclure MRN: [id number] Date of Birth: December 25, 1955

## 2019-06-30 ENCOUNTER — Ambulatory Visit: Payer: Medicare Other | Attending: Family Medicine | Admitting: Rehabilitation

## 2019-06-30 ENCOUNTER — Other Ambulatory Visit: Payer: Self-pay

## 2019-06-30 ENCOUNTER — Ambulatory Visit: Payer: Medicare Other | Admitting: Rehabilitation

## 2019-06-30 ENCOUNTER — Encounter: Payer: Self-pay | Admitting: Rehabilitation

## 2019-06-30 DIAGNOSIS — R293 Abnormal posture: Secondary | ICD-10-CM | POA: Insufficient documentation

## 2019-06-30 DIAGNOSIS — R262 Difficulty in walking, not elsewhere classified: Secondary | ICD-10-CM | POA: Diagnosis present

## 2019-06-30 DIAGNOSIS — G8929 Other chronic pain: Secondary | ICD-10-CM | POA: Insufficient documentation

## 2019-06-30 DIAGNOSIS — M545 Low back pain, unspecified: Secondary | ICD-10-CM

## 2019-06-30 DIAGNOSIS — M6281 Muscle weakness (generalized): Secondary | ICD-10-CM | POA: Diagnosis not present

## 2019-06-30 DIAGNOSIS — R208 Other disturbances of skin sensation: Secondary | ICD-10-CM

## 2019-06-30 DIAGNOSIS — R209 Unspecified disturbances of skin sensation: Secondary | ICD-10-CM | POA: Diagnosis present

## 2019-06-30 DIAGNOSIS — R296 Repeated falls: Secondary | ICD-10-CM | POA: Diagnosis present

## 2019-06-30 NOTE — Patient Instructions (Signed)
Access Code: [email or web address] URL: [email or web address] Date: 05/30/2019 Prepared by: Cameron Sprang  Exercises Standing Hip Extension with Counter Support - 1 x daily - 7 x weekly - 1 sets - 10 reps Side Stepping with Resistance at Thighs - 1 x daily - 7 x weekly - 1 sets - 4 reps Romberg Stance on Foam Pad - 1 x daily - 7 x weekly - 1 sets - 3 reps - 20 secs hold Romberg Stance Eyes Closed on Foam Pad - 1 x daily - 7 x weekly - 1 sets - 3 reps - 20 secs hold Clamshell with Resistance - 1 x daily - 7 x weekly - 1 sets - 10 reps Supine Hip Flexor Stretch with Weight - 1 x daily - 7 x weekly - 1 sets - 2 reps - 30-60 secs hold Hip and Knee Flexion with Anchored Resistance - 1 x daily - 7 x weekly - 1 sets - 10 reps

## 2019-06-30 NOTE — Therapy (Signed)
[facility] [address] [place], [place], [address] Phone: [phone]   Fax:  [phone]  Physical Therapy Treatment  Patient Details  Name: RAMEIR IVANOVA MRN: [id number] Date of Birth: 1956-01-15 Referring Provider (PT): Newman Pies, MD   Encounter Date: 06/30/2019  PT End of Session - 06/30/19 1851    Visit Number  16    Number of Visits  31    Date for PT Re-Evaluation  08/24/19    Authorization Type  [facility] - 10th visit PN.  $0 copay as long as Medicaid is active.  VL: MN    Progress Note Due on Visit  20    PT Start Time  1617    PT Stop Time  1700    PT Time Calculation (min)  43 min    Equipment Utilized During Treatment  Back brace    Activity Tolerance  Patient tolerated treatment well    Behavior During Therapy  WFL for tasks assessed/performed       Past Medical History:  Diagnosis Date  . Baker's cyst    Left calf  . CAD in native artery, 07/15/15 PCI of RCA with DES 07/16/2015   a.   NSTEMI 5/17: LHC - pLAD 20, pLCx 20, OM1 30, pRCA 100, EF 45-50%>> PCI: 2.5 x 24 mm Promus DES to RCA  //  b.   Echo 5/17: mild LVH, EF 50-55%, no RWMA, mod RVE //  c. LHC 6/17: pLAD 20, pLCx 20, OM1 50, pRCA stent ok, EF 35-45% with mod sized inf wall and basal segment aneurysm  . CHF (congestive heart failure) ([place])   . Chronic back pain    "down my back, down my legs" (02/18/2017)  . Constipation   . Constipation due to opioid therapy   . GERD (gastroesophageal reflux disease)    04/07/2019- not current  . Hyperlipidemia   . Hypertension    Dr. [name] [phone]  . Ischemic cardiomyopathy    a. LV-gram at time of LHC in 6/17 with EF 35-45%  //  b. Echo 7/17: EF 45-50%, inferior HK, grade 1 diastolic dysfunction, mildly dilated aortic root, moderately reduced RVSF, mild RAE  . Myocardial infarction ([place]) 2017  . 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 Numbness and tingling of both  lower extremities    "on the outside of both sides" (02/18/2017)  . Rheumatoid arthritis (H[facility])    RA  . Tachycardia   . Type II diabetes mellitus ([place])    diet controlled    Past Surgical History:  Procedure Laterality Date  . BACK SURGERY     x3  . CARDIAC CATHETERIZATION N/A 07/15/2015   Procedure: Left Heart Cath and Coronary Angiography;  Surgeon: [name], MD;  Location: [facility];  Service: Cardiovascular;  Laterality: N/A;  . CARDIAC CATHETERIZATION N/A 07/15/2015   Procedure: Coronary Stent Intervention;  Surgeon: [name], MD;  Location: [facility];  Service: Cardiovascular;  Laterality: N/A;  . CARDIAC CATHETERIZATION N/A 08/07/2015   Procedure: Left Heart Cath and Coronary Angiography;  Surgeon: [name], MD;  Location: [facility];  Service: Cardiovascular;  Laterality: N/A;  . CORONARY ANGIOPLASTY WITH STENT PLACEMENT  07/2015  . LEFT HEART CATH AND CORONARY ANGIOGRAPHY N/A 02/19/2017   Procedure: LEFT HEART CATH AND CORONARY ANGIOGRAPHY;  Surgeon: Martinique, Peter M, MD;  Location: [facility];  Service: Cardiovascular;  Laterality: N/A;  . LUMBAR FUSION  04/11/2019   REVISION OF THORACOLUMBAR FUSION   . LUMBAR SPINE SURGERY  03/2009  & 2012  . MASS EXCISION N/A 04/19/2012   Procedure: removal of posterior cervical lipoma;  Surgeon: [name], MD;  Location: [facility];  Service: Neurosurgery;  Laterality: N/A;  Removal of posterior cervical lipoma  . POPLITEAL SYNOVIAL CYST EXCISION Left    "opened up behind my knee; scraped out arthritis"  . POSTERIOR LUMBAR FUSION 4 LEVEL N/A 11/22/2018   Procedure: Posterior Lateral and Interbody fusion - Lumbar one-Lumbar two; explore fusion, posterior instrumentation and fusion Thoracic ten to the ilium;  Surgeon: Newman Pies, MD;  Location: [facility];  Service: Neurosurgery;  Laterality: N/A;  . SPINAL CORD STIMULATOR INSERTION N/A 01/07/2013   Procedure:  SPINAL CORD STIMULATOR  INSERTION;  Surgeon: [name], MD;  Location: [facility];  Service: Neurosurgery;  Laterality: N/A;  . SPINAL CORD STIMULATOR INSERTION N/A 02/09/2019   Procedure: REPLACEMENT OF LUMBAR SPINAL CORD STIMULATOR;  Surgeon: Newman Pies, MD;  Location: [facility];  Service: Neurosurgery;  Laterality: N/A;  Thoracic/Lumbar spine  . TONSILLECTOMY     patient denies  . TOTAL HIP ARTHROPLASTY Right 10/03/2016   Procedure: TOTAL HIP ARTHROPLASTY;  Surgeon: [name], MD;  Location: [facility];  Service: Orthopedics;  Laterality: Right;  . WISDOM TOOTH EXTRACTION     hx of    There were no vitals filed for this visit.  Subjective Assessment - 06/30/19 1628    Subjective  Pt doing well, has been walking more without cane/walker outdoors.    Pertinent History  NSTEMI, HTN, HLD, chronic back pain with multiple surgeries and spinal cord stimulator, CAD, ischemic cardiomyopathy with coronary angioplasty with stent placement, L ventricular aneurysm, type 2 DM, obesity, R hip OA with R THA    Limitations  Standing;Walking;House hold activities    How long can you sit comfortably?  1 hour    Currently in Pain?  No/denies                       [facility] Adult PT Treatment/Exercise - 06/30/19 1644      Ambulation/Gait   Ambulation/Gait  Yes    Ambulation/Gait Assistance  5: Supervision;4: Min guard    Ambulation/Gait Assistance Details  Ambulation outdoors x 1000' with SPC for endurance and improved quality.  TLSO donned.  Min cues for posture and increased hip/knee flex when in grass and min/guard when in grass for safety.  Note increased flexion in trunk and Trendelenburg when fatigued.  Continue to recommend RW for longer distances due to gait deviations.     Ambulation Distance (Feet)  1000 Feet    Assistive device  Straight cane    Gait Pattern  Trendelenburg;Trunk flexed    Ambulation Surface  Level;Unlevel;Indoor;Outdoor;Paved;Grass      Self-Care   Self-Care  Other Self-Care  Comments    Other Self-Care Comments   Continue to educate on use of RW when ambulating longer distances to decrease gait deviations.  Also encourage him to continue with HEP and do with light support as needed.  Pt reports he has added braiding to routine.  PT assessed to ensure safety-recommend UE support.  Also discussed POC and that he will likely only need 4 more weeks of PT vs 8 weeks.       Exercises   Other Exercises   Supine hip flex stretch x 2 mins each side  with PT providing light over pressure into knee flexion, standing marching x 10 reps on each side with use of red theraband.  updated HEP.         Access Code: [email or web address] URL: [email or web address] Date: 05/30/2019 Prepared by: Cameron Sprang  Exercises Standing Hip Extension with Counter Support - 1 x daily - 7 x weekly - 1 sets - 10 reps Side Stepping with Resistance at Thighs - 1 x daily - 7 x weekly - 1 sets - 4 reps Romberg Stance on Foam Pad - 1 x daily - 7 x weekly - 1 sets - 3 reps - 20 secs hold Romberg Stance Eyes Closed on Foam Pad - 1 x daily - 7 x weekly - 1 sets - 3 reps - 20 secs hold Clamshell with Resistance - 1 x daily - 7 x weekly - 1 sets - 10 reps Supine Hip Flexor Stretch with Weight - 1 x daily - 7 x weekly - 1 sets - 2 reps - 30-60 secs hold Hip and Knee Flexion with Anchored Resistance - 1 x daily - 7 x weekly - 1 sets - 10 reps        PT Short Term Goals - 06/23/19 1654      PT SHORT TERM GOAL #1   Title  Pt will demonstrate independence with ongoing and progressed HEP    Time  4    Period  Weeks    Status  New    Target Date  07/25/19      PT SHORT TERM GOAL #2   Title  Pt will improve DGI without AD by 2 points    Baseline  21/24    Time  4    Period  Weeks    Status  Revised    Target Date  07/25/19      PT SHORT TERM GOAL #3   Title  Pt will improve gait velocity without AD to >/= 2.62 ft/sec    Baseline  2.54 ft/sec without AD    Time  4    Period  Weeks     Status  New    Target Date  07/25/19      PT SHORT TERM GOAL #4   Title  Pt will negotiate 8 stairs with one rail, alternating sequence with supervision    Time  4    Period  Weeks    Status  Revised    Target Date  07/25/19        PT Long Term Goals - 06/23/19 1658      PT LONG TERM GOAL #1   Title  Pt will be independent with final HEP    Time  8    Period  Weeks    Status  Revised    Target Date  08/24/19      PT LONG TERM GOAL #2   Title  Pt will improve 6MWT distance to >/= 1200' with LRAD in order to indicate improved endurance.    Baseline  1189'    Time  8    Period  Weeks    Status  Revised    Target Date  08/24/19      PT LONG TERM GOAL #3   Title  Pt will improve gait speed to >/= 3.0 ft/sec without AD    Time  8    Period  Weeks    Status  Revised    Target Date  08/24/19      PT  LONG TERM GOAL #4   Title  Pt will improve DGI to >/=23/24 without AD to indicate decreased falls when ambulating in the community    Baseline  21/24    Time  8    Period  Weeks    Status  Achieved    Target Date  08/24/19      PT LONG TERM GOAL #5   Title  Pt will ambulate 500' over unlevel outdoor paved surfaces w/ SPC at mod I level in order to indicate improved community mobility.    Time  8    Period  Weeks    Status  On-going    Target Date  08/24/19            Plan - 06/30/19 1852    Clinical Impression Statement  Pt reporting he is ambulating outdoors without AD therefore did gait outdoors today x 1000' to also address endurance goals.  Note that he preferred to use [facility] and still demo'd gait deviations therefore recommend he use RW for longer distances.  Began to review HEP and update, will continue at next session.    Personal Factors and Comorbidities  Comorbidity 3+;Fitness;Past/Current Experience    Comorbidities  NSTEMI, HTN, HLD, chronic back pain with multiple surgeries and spinal cord stimulator, CAD, ischemic cardiomyopathy with coronary angioplasty  with stent placement, L ventricular aneurysm, type 2 DM, obesity, R hip OA with R THA    Examination-Activity Limitations  Bed Mobility;Bend;Dressing;Lift;Locomotion Level;Stairs;Stand    Examination-Participation Restrictions  Cleaning;Community Activity    Stability/Clinical Decision Making  Evolving/Moderate complexity    Rehab Potential  Good    PT Frequency  2x / week    PT Duration  8 weeks    PT Treatment/Interventions  ADLs/Self Care Home Management;Aquatic Therapy;Cryotherapy;Moist Heat;DME Instruction;Gait training;Stair training;Functional mobility training;Therapeutic activities;Therapeutic exercise;Balance training;Neuromuscular re-education;Patient/family education;Orthotic Fit/Training;Scar mobilization;Passive range of motion;Dry needling;Taping;Manual techniques    PT Next Visit Plan  UPDATE HEP due to progress.  Endurance on SCI FIT - increase to 12 minutes.  terminal hip extension strength, hip abd strength-step ups on shorter step, SLS; continue balance, compliant surfaces, EC, gait with SPC as able,  PATIENT WEARS TLSO, BACK PRECAUTIONS.    PT Home Exercise Plan  [id number]    Consulted and Agree with Plan of Care  Patient       Patient will benefit from skilled therapeutic intervention in order to improve the following deficits and impairments:  Decreased activity tolerance, Decreased balance, Decreased endurance, Decreased mobility, Decreased range of motion, Decreased strength, Difficulty walking, Increased edema, Impaired sensation, Postural dysfunction, Pain  Visit Diagnosis: Muscle weakness (generalized)  Abnormal posture  Chronic bilateral low back pain, unspecified whether sciatica present  Difficulty in walking, not elsewhere classified  Repeated falls  Other disturbances of skin sensation     Problem List Patient Active Problem List   Diagnosis Date Noted  . Chronic low back pain 02/09/2019  . Spinal stenosis of lumbar region with neurogenic  claudication 11/22/2018  . Radiculopathy of lumbar region 05/31/2017  . HCAP (healthcare-associated pneumonia) 05/30/2017  . Leukocytosis 05/30/2017  . Lumbar pseudoarthrosis 05/27/2017  . Unstable angina ([place]) 02/18/2017  . Primary localized osteoarthritis of right hip 10/03/2016  . Chest pain, rule out acute myocardial infarction 05/01/2016  . Diabetes mellitus type 2, controlled, with complications ([place]) [id number]  . Obesity (BMI 30-39.9) 05/01/2016  . Left ventricular aneurysm 12/03/2015  . Ischemic cardiomyopathy   . CAD (coronary artery disease) 07/16/2015  . Hypokalemia 07/16/2015  .  History of non-ST elevation myocardial infarction (NSTEMI) 07/15/2015  . Hypertension 07/15/2015  . Hyperlipidemia with target LDL less than 100 07/15/2015  . Back pain 07/15/2015  . GERD (gastroesophageal reflux disease) 06/18/2012  . Chronic radicular low back pain 06/18/2012    Cameron Sprang, PT, MPT [facility] [address] [place], [place], [address] Phone: [phone]   Fax:  [phone] 06/30/19, 7:03 PM  Name: NATHYN DONOHOE MRN: [id number] Date of Birth: [birth year]

## 2019-07-01 ENCOUNTER — Ambulatory Visit: Payer: Medicare Other | Admitting: Rehabilitation

## 2019-07-01 ENCOUNTER — Encounter: Payer: Self-pay | Admitting: Rehabilitation

## 2019-07-01 DIAGNOSIS — R293 Abnormal posture: Secondary | ICD-10-CM

## 2019-07-01 DIAGNOSIS — R296 Repeated falls: Secondary | ICD-10-CM

## 2019-07-01 DIAGNOSIS — R208 Other disturbances of skin sensation: Secondary | ICD-10-CM

## 2019-07-01 DIAGNOSIS — G8929 Other chronic pain: Secondary | ICD-10-CM

## 2019-07-01 DIAGNOSIS — M545 Low back pain: Secondary | ICD-10-CM

## 2019-07-01 DIAGNOSIS — R262 Difficulty in walking, not elsewhere classified: Secondary | ICD-10-CM

## 2019-07-01 DIAGNOSIS — M6281 Muscle weakness (generalized): Secondary | ICD-10-CM | POA: Diagnosis not present

## 2019-07-01 NOTE — Patient Instructions (Signed)
Access Code: [email or web address] URL: [email or web address] Date: 05/30/2019 Prepared by: Cameron Sprang  Exercises Standing Hip Extension with Counter Support - 1 x daily - 7 x weekly - 1 sets - 10 reps Side Stepping with Resistance at Thighs - 1 x daily - 7 x weekly - 1 sets - 4 reps Romberg Stance Eyes Closed on Foam Pad - 1 x daily - 7 x weekly - 1 sets - 3 reps - 20 secs hold Clamshell with Resistance - 1 x daily - 7 x weekly - 1 sets - 10 reps Supine Hip Flexor Stretch with Weight - 1 x daily - 7 x weekly - 1 sets - 2 reps - 30-60 secs hold Hip and Knee Flexion with Anchored Resistance - 1 x daily - 7 x weekly - 1 sets - 10 reps Romberg Stance on Foam Pad with Head Rotation - 1 x daily - 7 x weekly - 1 sets - 10 reps Romberg Stance with Head Nods on Foam Pad - 1 x daily - 7 x weekly - 1 sets - 10 reps

## 2019-07-01 NOTE — Therapy (Signed)
[facility] [address] [place], [place], [address] Phone: [phone]   Fax:  [phone]  Physical Therapy Treatment  Patient Details  Name: Thomas Mcclure MRN: [id number] Date of Birth: 04/21/1955 Referring Provider (PT): Newman Pies, MD   Encounter Date: 07/01/2019  PT End of Session - 07/01/19 1230    Visit Number  17    Number of Visits  31    Date for PT Re-Evaluation  08/24/19    Authorization Type  [facility] - 10th visit PN.  $0 copay as long as Medicaid is active.  VL: MN    Progress Note Due on Visit  20    PT Start Time  0930    PT Stop Time  1015    PT Time Calculation (min)  45 min    Equipment Utilized During Treatment  Back brace    Activity Tolerance  Patient tolerated treatment well    Behavior During Therapy  WFL for tasks assessed/performed       Past Medical History:  Diagnosis Date  . Baker's cyst    Left calf  . CAD in native artery, 07/15/15 PCI of RCA with DES 07/16/2015   a.   NSTEMI 5/17: LHC - pLAD 20, pLCx 20, OM1 30, pRCA 100, EF 45-50%>> PCI: 2.5 x 24 mm Promus DES to RCA  //  b.   Echo 5/17: mild LVH, EF 50-55%, no RWMA, mod RVE //  c. LHC 6/17: pLAD 20, pLCx 20, OM1 50, pRCA stent ok, EF 35-45% with mod sized inf wall and basal segment aneurysm  . CHF (congestive heart failure) ([place])   . Chronic back pain    "down my back, down my legs" (02/18/2017)  . Constipation   . Constipation due to opioid therapy   . GERD (gastroesophageal reflux disease)    04/07/2019- not current  . Hyperlipidemia   . Hypertension    Dr. [name] [phone]  . Ischemic cardiomyopathy    a. LV-gram at time of LHC in 6/17 with EF 35-45%  //  b. Echo 7/17: EF 45-50%, inferior HK, grade 1 diastolic dysfunction, mildly dilated aortic root, moderately reduced RVSF, mild RAE  . Myocardial infarction ([place]) 2017  . 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 Numbness and tingling of both  lower extremities    "on the outside of both sides" (02/18/2017)  . Rheumatoid arthritis ([facility])    RA  . Tachycardia   . Type II diabetes mellitus ([facility])    diet controlled    Past Surgical History:  Procedure Laterality Date  . BACK SURGERY     x3  . CARDIAC CATHETERIZATION N/A 07/15/2015   Procedure: Left Heart Cath and Coronary Angiography;  Surgeon: [name], MD;  Location: [facility];  Service: Cardiovascular;  Laterality: N/A;  . CARDIAC CATHETERIZATION N/A 07/15/2015   Procedure: Coronary Stent Intervention;  Surgeon: [name], MD;  Location: [facility];  Service: Cardiovascular;  Laterality: N/A;  . CARDIAC CATHETERIZATION N/A 08/07/2015   Procedure: Left Heart Cath and Coronary Angiography;  Surgeon: [name], MD;  Location: [facility];  Service: Cardiovascular;  Laterality: N/A;  . CORONARY ANGIOPLASTY WITH STENT PLACEMENT  07/2015  . LEFT HEART CATH AND CORONARY ANGIOGRAPHY N/A 02/19/2017   Procedure: LEFT HEART CATH AND CORONARY ANGIOGRAPHY;  Surgeon: Martinique, Peter M, MD;  Location: [facility];  Service: Cardiovascular;  Laterality: N/A;  . LUMBAR FUSION  04/11/2019   REVISION OF THORACOLUMBAR FUSION   . LUMBAR SPINE SURGERY  03/2009  & 2012  . MASS EXCISION N/A 04/19/2012   Procedure: removal of posterior cervical lipoma;  Surgeon: [name], MD;  Location: [facility];  Service: Neurosurgery;  Laterality: N/A;  Removal of posterior cervical lipoma  . POPLITEAL SYNOVIAL CYST EXCISION Left    "opened up behind my knee; scraped out arthritis"  . POSTERIOR LUMBAR FUSION 4 LEVEL N/A 11/22/2018   Procedure: Posterior Lateral and Interbody fusion - Lumbar one-Lumbar two; explore fusion, posterior instrumentation and fusion Thoracic ten to the ilium;  Surgeon: Newman Pies, MD;  Location: [facility];  Service: Neurosurgery;  Laterality: N/A;  . SPINAL CORD STIMULATOR INSERTION N/A 01/07/2013   Procedure:  SPINAL CORD STIMULATOR  INSERTION;  Surgeon: [name], MD;  Location: [facility];  Service: Neurosurgery;  Laterality: N/A;  . SPINAL CORD STIMULATOR INSERTION N/A 02/09/2019   Procedure: REPLACEMENT OF LUMBAR SPINAL CORD STIMULATOR;  Surgeon: Newman Pies, MD;  Location: [facility];  Service: Neurosurgery;  Laterality: N/A;  Thoracic/Lumbar spine  . TONSILLECTOMY     patient denies  . TOTAL HIP ARTHROPLASTY Right 10/03/2016   Procedure: TOTAL HIP ARTHROPLASTY;  Surgeon: [name], MD;  Location: [facility];  Service: Orthopedics;  Laterality: Right;  . WISDOM TOOTH EXTRACTION     hx of    There were no vitals filed for this visit.  Subjective Assessment - 07/01/19 0934    Subjective  Doing well this morning!    Pertinent History  NSTEMI, HTN, HLD, chronic back pain with multiple surgeries and spinal cord stimulator, CAD, ischemic cardiomyopathy with coronary angioplasty with stent placement, L ventricular aneurysm, type 2 DM, obesity, R hip OA with R THA    Limitations  Standing;Walking;House hold activities    Patient Stated Goals  To walk without the RW    Currently in Pain?  No/denies                       [facility] Adult PT Treatment/Exercise - 07/01/19 0939      Ambulation/Gait   Ambulation/Gait  Yes    Ambulation/Gait Assistance  5: Supervision    Ambulation/Gait Assistance Details  Without device during session with min cues for posture and improved R hip activation in stance with improved B hip extension.     Ambulation Distance (Feet)  300 Feet    Assistive device  None    Gait Pattern  Trendelenburg;Trunk flexed    Ambulation Surface  Level;Indoor      Exercises   Exercises  Other Exercises    Other Exercises   Continued to review HEP and performed corner exercises, removed romberg stance with eyes open and added romberg stance with EC with head turns (already doing romberg EC)-all on foam.  See pt instruction for details.  Also performed side stepping with red band today,  updated HEP.  Attempted hip extension with red band, however he is unable to maintain proper technique, therefore did not continue with this task and provided cues for maintaining posture and doing exercise slowly at home.       Lumbar Exercises: Aerobic   Other Aerobic Exercise  Scifit stepper x 12 minutes with BLEs only at level 3 resistance maintaining rpms in 70's.            Access Code: [email or web address] URL: [email or web address] Date: 05/30/2019 Prepared by: Cameron Sprang  Exercises Standing Hip Extension with Counter Support - 1 x daily - 7 x weekly - 1 sets - 10 reps Side Stepping with Resistance at Thighs - 1 x daily - 7 x weekly - 1 sets - 4 reps Romberg Stance Eyes Closed on Foam Pad - 1 x daily - 7 x weekly - 1 sets - 3 reps - 20 secs hold Clamshell with Resistance - 1 x daily - 7 x weekly - 1 sets - 10 reps Supine Hip Flexor Stretch with Weight - 1 x daily - 7 x weekly - 1 sets - 2 reps - 30-60 secs hold Hip and Knee Flexion with Anchored Resistance - 1 x daily - 7 x weekly - 1 sets - 10 reps Romberg Stance on Foam Pad with Head Rotation - 1 x daily - 7 x weekly - 1 sets - 10 reps Romberg Stance with Head Nods on Foam Pad - 1 x daily - 7 x weekly - 1 sets - 10 reps    PT Education - 07/01/19 1230    Education Details  updates to HEP    Person(s) Educated  Patient    Methods  Explanation;Demonstration;Handout    Comprehension  Verbalized understanding;Returned demonstration       PT Short Term Goals - 06/23/19 1654      PT SHORT TERM GOAL #1   Title  Pt will demonstrate independence with ongoing and progressed HEP    Time  4    Period  Weeks    Status  New    Target Date  07/25/19      PT SHORT TERM GOAL #2   Title  Pt will improve DGI without AD by 2 points    Baseline  21/24    Time  4    Period  Weeks    Status  Revised    Target Date  07/25/19      PT SHORT TERM GOAL #3   Title  Pt will improve gait velocity without AD to >/= 2.62 ft/sec     Baseline  2.54 ft/sec without AD    Time  4    Period  Weeks    Status  New    Target Date  07/25/19      PT SHORT TERM GOAL #4   Title  Pt will negotiate 8 stairs with one rail, alternating sequence with supervision    Time  4    Period  Weeks    Status  Revised    Target Date  07/25/19        PT Long Term Goals - 06/23/19 1658      PT LONG TERM GOAL #1   Title  Pt will be independent with final HEP    Time  8    Period  Weeks    Status  Revised    Target Date  08/24/19      PT LONG TERM GOAL #2   Title  Pt will improve 6MWT distance to >/= 1200' with LRAD in order to indicate improved endurance.    Baseline  1189'    Time  8    Period  Weeks    Status  Revised    Target Date  08/24/19      PT LONG TERM GOAL #3   Title  Pt will improve gait speed to >/= 3.0 ft/sec without AD    Time  8    Period  Weeks    Status  Revised  Target Date  08/24/19      PT LONG TERM GOAL #4   Title  Pt will improve DGI to >/=23/24 without AD to indicate decreased falls when ambulating in the community    Baseline  21/24    Time  8    Period  Weeks    Status  Achieved    Target Date  08/24/19      PT LONG TERM GOAL #5   Title  Pt will ambulate 500' over unlevel outdoor paved surfaces w/ SPC at mod I level in order to indicate improved community mobility.    Time  8    Period  Weeks    Status  On-going    Target Date  08/24/19            Plan - 07/01/19 1230    Clinical Impression Statement  Session continues to focus on endurance with seated Scifit, which he was able to complete 12 mins with LEs only maintaining rpms in the 70's.  Tolerated well.  Also reviewed corner balance tasks as pt has not been very compliant with these at home.  Did modify slighty but educated on importance of performing daily and how this will carry over to outdoor activity and improved BLE strrength.  Pt verbalized understanding.    Personal Factors and Comorbidities  Comorbidity  3+;Fitness;Past/Current Experience    Comorbidities  NSTEMI, HTN, HLD, chronic back pain with multiple surgeries and spinal cord stimulator, CAD, ischemic cardiomyopathy with coronary angioplasty with stent placement, L ventricular aneurysm, type 2 DM, obesity, R hip OA with R THA    Examination-Activity Limitations  Bed Mobility;Bend;Dressing;Lift;Locomotion Level;Stairs;Stand    Examination-Participation Restrictions  Cleaning;Community Activity    Stability/Clinical Decision Making  Evolving/Moderate complexity    Rehab Potential  Good    PT Frequency  2x / week    PT Duration  8 weeks    PT Treatment/Interventions  ADLs/Self Care Home Management;Aquatic Therapy;Cryotherapy;Moist Heat;DME Instruction;Gait training;Stair training;Functional mobility training;Therapeutic activities;Therapeutic exercise;Balance training;Neuromuscular re-education;Patient/family education;Orthotic Fit/Training;Scar mobilization;Passive range of motion;Dry needling;Taping;Manual techniques    PT Next Visit Plan  Endurance on SCI FIT - increase to 12 minutes.  terminal hip extension strength, hip abd strength-step ups on shorter step, SLS; continue balance, compliant surfaces, EC, gait with SPC as able,  PATIENT WEARS TLSO, BACK PRECAUTIONS.    PT Home Exercise Plan  [email or web address]    Consulted and Agree with Plan of Care  Patient       Patient will benefit from skilled therapeutic intervention in order to improve the following deficits and impairments:  Decreased activity tolerance, Decreased balance, Decreased endurance, Decreased mobility, Decreased range of motion, Decreased strength, Difficulty walking, Increased edema, Impaired sensation, Postural dysfunction, Pain  Visit Diagnosis: Muscle weakness (generalized)  Abnormal posture  Chronic bilateral low back pain, unspecified whether sciatica present  Difficulty in walking, not elsewhere classified  Repeated falls  Other disturbances of skin  sensation     Problem List Patient Active Problem List   Diagnosis Date Noted  . Chronic low back pain 02/09/2019  . Spinal stenosis of lumbar region with neurogenic claudication 11/22/2018  . Radiculopathy of lumbar region 05/31/2017  . HCAP (healthcare-associated pneumonia) 05/30/2017  . Leukocytosis 05/30/2017  . Lumbar pseudoarthrosis 05/27/2017  . Unstable angina ([place]) 02/18/2017  . Primary localized osteoarthritis of right hip 10/03/2016  . Chest pain, rule out acute myocardial infarction 05/01/2016  . Diabetes mellitus type 2, controlled, with complications ([place]) [id number]  . Obesity (BMI 30-39.9) 05/01/2016  .  Left ventricular aneurysm 12/03/2015  . Ischemic cardiomyopathy   . CAD (coronary artery disease) 07/16/2015  . Hypokalemia 07/16/2015  . History of non-ST elevation myocardial infarction (NSTEMI) 07/15/2015  . Hypertension 07/15/2015  . Hyperlipidemia with target LDL less than 100 07/15/2015  . Back pain 07/15/2015  . GERD (gastroesophageal reflux disease) 06/18/2012  . Chronic radicular low back pain 06/18/2012    Cameron Sprang, PT, MPT [facility] [address] [place], [place], [address] Phone: [phone]   Fax:  [phone] 07/01/19, 12:34 PM  Name: ADARRYLL LARRIMORE MRN: [id number] Date of Birth: 1955-10-27

## 2019-07-04 ENCOUNTER — Telehealth: Payer: Self-pay | Admitting: Rehabilitation

## 2019-07-04 NOTE — Telephone Encounter (Signed)
Dr. [name],   I have been seeing Thomas Mcclure here at [facility] neuro for PT following back surgery with you.  He has made great progress and he is wanting to know if he can remove TLSO and only wear LSO??  His surgery was 2/15 (revision of thoracolumbar fusion).  Please advise and I will let him know.   Thanks,  Cameron Sprang, PT, MPT [facility] [address] [place], [place], [address] Phone: [phone]   Fax:  [phone] 07/04/19, 9:53 AM

## 2019-07-07 ENCOUNTER — Encounter: Payer: Self-pay | Admitting: Rehabilitation

## 2019-07-07 ENCOUNTER — Ambulatory Visit: Payer: Medicare Other | Admitting: Rehabilitation

## 2019-07-07 ENCOUNTER — Other Ambulatory Visit: Payer: Self-pay

## 2019-07-07 DIAGNOSIS — M6281 Muscle weakness (generalized): Secondary | ICD-10-CM | POA: Diagnosis not present

## 2019-07-07 DIAGNOSIS — R296 Repeated falls: Secondary | ICD-10-CM

## 2019-07-07 DIAGNOSIS — R293 Abnormal posture: Secondary | ICD-10-CM

## 2019-07-07 DIAGNOSIS — R262 Difficulty in walking, not elsewhere classified: Secondary | ICD-10-CM

## 2019-07-07 DIAGNOSIS — G8929 Other chronic pain: Secondary | ICD-10-CM

## 2019-07-07 NOTE — Therapy (Signed)
[facility] [address] [place], [place], [address] Phone: [phone]   Fax:  [phone]  Physical Therapy Treatment  Patient Details  Name: Thomas Mcclure MRN: [id number] Date of Birth: 09/20/1955 Referring Provider (PT): Newman Pies, MD   Encounter Date: 07/07/2019  PT End of Session - 07/07/19 1815    Visit Number  18    Number of Visits  31    Date for PT Re-Evaluation  08/24/19    Authorization Type  [place] - 10th visit PN.  $0 copay as long as Medicaid is active.  VL: MN    Progress Note Due on Visit  20    PT Start Time  1534    PT Stop Time  1615    PT Time Calculation (min)  41 min    Equipment Utilized During Treatment  Back brace    Activity Tolerance  Patient tolerated treatment well    Behavior During Therapy  WFL for tasks assessed/performed       Past Medical History:  Diagnosis Date  . Baker's cyst    Left calf  . CAD in native artery, 07/15/15 PCI of RCA with DES 07/16/2015   a.   NSTEMI 5/17: LHC - pLAD 20, pLCx 20, OM1 30, pRCA 100, EF 45-50%>> PCI: 2.5 x 24 mm Promus DES to RCA  //  b.   Echo 5/17: mild LVH, EF 50-55%, no RWMA, mod RVE //  c. LHC 6/17: pLAD 20, pLCx 20, OM1 50, pRCA stent ok, EF 35-45% with mod sized inf wall and basal segment aneurysm  . CHF (congestive heart failure) ([place])   . Chronic back pain    "down my back, down my legs" (02/18/2017)  . Constipation   . Constipation due to opioid therapy   . GERD (gastroesophageal reflux disease)    04/07/2019- not current  . Hyperlipidemia   . Hypertension    Dr. [name] [phone]  . Ischemic cardiomyopathy    a. LV-gram at time of LHC in 6/17 with EF 35-45%  //  b. Echo 7/17: EF 45-50%, inferior HK, grade 1 diastolic dysfunction, mildly dilated aortic root, moderately reduced RVSF, mild RAE  . Myocardial infarction ([place]) 2017  . 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 Numbness and tingling of both  lower extremities    "on the outside of both sides" (02/18/2017)  . Rheumatoid arthritis ([facility])    RA  . Tachycardia   . Type II diabetes mellitus ([place])    diet controlled    Past Surgical History:  Procedure Laterality Date  . BACK SURGERY     x3  . CARDIAC CATHETERIZATION N/A 07/15/2015   Procedure: Left Heart Cath and Coronary Angiography;  Surgeon: [name], MD;  Location: [facility];  Service: Cardiovascular;  Laterality: N/A;  . CARDIAC CATHETERIZATION N/A 07/15/2015   Procedure: Coronary Stent Intervention;  Surgeon: [name], MD;  Location: [facility];  Service: Cardiovascular;  Laterality: N/A;  . CARDIAC CATHETERIZATION N/A 08/07/2015   Procedure: Left Heart Cath and Coronary Angiography;  Surgeon: [name], MD;  Location: [facility];  Service: Cardiovascular;  Laterality: N/A;  . CORONARY ANGIOPLASTY WITH STENT PLACEMENT  07/2015  . LEFT HEART CATH AND CORONARY ANGIOGRAPHY N/A 02/19/2017   Procedure: LEFT HEART CATH AND CORONARY ANGIOGRAPHY;  Surgeon: Martinique, Peter M, MD;  Location: [facility];  Service: Cardiovascular;  Laterality: N/A;  . LUMBAR FUSION  04/11/2019   REVISION OF THORACOLUMBAR FUSION   . LUMBAR SPINE SURGERY  03/2009  & 2012  . MASS EXCISION N/A 04/19/2012   Procedure: removal of posterior cervical lipoma;  Surgeon: [name], MD;  Location: [facility];  Service: Neurosurgery;  Laterality: N/A;  Removal of posterior cervical lipoma  . POPLITEAL SYNOVIAL CYST EXCISION Left    "opened up behind my knee; scraped out arthritis"  . POSTERIOR LUMBAR FUSION 4 LEVEL N/A 11/22/2018   Procedure: Posterior Lateral and Interbody fusion - Lumbar one-Lumbar two; explore fusion, posterior instrumentation and fusion Thoracic ten to the ilium;  Surgeon: Newman Pies, MD;  Location: [facility];  Service: Neurosurgery;  Laterality: N/A;  . SPINAL CORD STIMULATOR INSERTION N/A 01/07/2013   Procedure:  SPINAL CORD STIMULATOR  INSERTION;  Surgeon: [name], MD;  Location: [facility];  Service: Neurosurgery;  Laterality: N/A;  . SPINAL CORD STIMULATOR INSERTION N/A 02/09/2019   Procedure: REPLACEMENT OF LUMBAR SPINAL CORD STIMULATOR;  Surgeon: Newman Pies, MD;  Location: [facility];  Service: Neurosurgery;  Laterality: N/A;  Thoracic/Lumbar spine  . TONSILLECTOMY     patient denies  . TOTAL HIP ARTHROPLASTY Right 10/03/2016   Procedure: TOTAL HIP ARTHROPLASTY;  Surgeon: [name], MD;  Location: [facility];  Service: Orthopedics;  Laterality: Right;  . WISDOM TOOTH EXTRACTION     hx of    There were no vitals filed for this visit.  Subjective Assessment - 07/07/19 1536    Subjective  Pt tired today, has been moving sound equipment around all day in spare room.    Pertinent History  NSTEMI, HTN, HLD, chronic back pain with multiple surgeries and spinal cord stimulator, CAD, ischemic cardiomyopathy with coronary angioplasty with stent placement, L ventricular aneurysm, type 2 DM, obesity, R hip OA with R THA    How long can you walk comfortably?  short distances in my home    Patient Stated Goals  To walk without the RW    Currently in Pain?  No/denies    Pain Score  0-No pain                        [facility] Adult PT Treatment/Exercise - 07/07/19 1551      Ambulation/Gait   Ambulation/Gait  Yes    Ambulation/Gait Assistance  5: Supervision    Ambulation/Gait Assistance Details  Pt with very forward flexed posture into clinic today and note increase in Trendelenburg esp with increased fatigue.  Again, pt reporting he has been busy today moving things.     Ambulation Distance (Feet)  100 Feet    Assistive device  Rolling walker;None    Gait Pattern  Trendelenburg;Trunk flexed    Ambulation Surface  Level;Indoor      Self-Care   Self-Care  Lifting    Lifting  Pt reports lifting 10lbs from ground today.  had pt demo how he was lifting.  Note small BOS and forward bending, therefore PT  demo'd how to widen BOS, get close to object and keep object close to body when lifting and lowering.  Performed x 5 reps.        Neuro Re-ed    Neuro Re-ed Details   High level balance on foam therapy mats: marching forwards/backwards x 1 rep each, tandem gait forwards x 2 reps (approx 10' each).   Step ups onto small rocker board with RLE tapping LLE to chair  then back to ground x 7 reps on each side with single UE support.  Continue to note R hip weakness but has improved since previous sessions.        Lumbar Exercises: Aerobic   Other Aerobic Exercise  Sci fit stepper x 5 mins (due to time) at level 3.5 resistance LEs only maintaining rpms in 70's.                 PT Short Term Goals - 06/23/19 1654      PT SHORT TERM GOAL #1   Title  Pt will demonstrate independence with ongoing and progressed HEP    Time  4    Period  Weeks    Status  New    Target Date  07/25/19      PT SHORT TERM GOAL #2   Title  Pt will improve DGI without AD by 2 points    Baseline  21/24    Time  4    Period  Weeks    Status  Revised    Target Date  07/25/19      PT SHORT TERM GOAL #3   Title  Pt will improve gait velocity without AD to >/= 2.62 ft/sec    Baseline  2.54 ft/sec without AD    Time  4    Period  Weeks    Status  New    Target Date  07/25/19      PT SHORT TERM GOAL #4   Title  Pt will negotiate 8 stairs with one rail, alternating sequence with supervision    Time  4    Period  Weeks    Status  Revised    Target Date  07/25/19        PT Long Term Goals - 06/23/19 1658      PT LONG TERM GOAL #1   Title  Pt will be independent with final HEP    Time  8    Period  Weeks    Status  Revised    Target Date  08/24/19      PT LONG TERM GOAL #2   Title  Pt will improve 6MWT distance to >/= 1200' with LRAD in order to indicate improved endurance.    Baseline  1189'    Time  8    Period  Weeks    Status  Revised    Target Date  08/24/19      PT LONG TERM GOAL #3    Title  Pt will improve gait speed to >/= 3.0 ft/sec without AD    Time  8    Period  Weeks    Status  Revised    Target Date  08/24/19      PT LONG TERM GOAL #4   Title  Pt will improve DGI to >/=23/24 without AD to indicate decreased falls when ambulating in the community    Baseline  21/24    Time  8    Period  Weeks    Status  Achieved    Target Date  08/24/19      PT LONG TERM GOAL #5   Title  Pt will ambulate 500' over unlevel outdoor paved surfaces w/ SPC at mod I level in order to indicate improved community mobility.    Time  8    Period  Weeks    Status  On-going    Target Date  08/24/19  Plan - 07/07/19 1815    Clinical Impression Statement  Skilled session focused on high level balance with decreased UE support.  Pt had marked difficulty with tandem gait and R SLS (due to hip weakness).  Note overall increased fatigue during session with balance tasks.    Personal Factors and Comorbidities  Comorbidity 3+;Fitness;Past/Current Experience    Comorbidities  NSTEMI, HTN, HLD, chronic back pain with multiple surgeries and spinal cord stimulator, CAD, ischemic cardiomyopathy with coronary angioplasty with stent placement, L ventricular aneurysm, type 2 DM, obesity, R hip OA with R THA    Examination-Activity Limitations  Bed Mobility;Bend;Dressing;Lift;Locomotion Level;Stairs;Stand    Examination-Participation Restrictions  Cleaning;Community Activity    Stability/Clinical Decision Making  Evolving/Moderate complexity    Rehab Potential  Good    PT Frequency  2x / week    PT Duration  8 weeks    PT Treatment/Interventions  ADLs/Self Care Home Management;Aquatic Therapy;Cryotherapy;Moist Heat;DME Instruction;Gait training;Stair training;Functional mobility training;Therapeutic activities;Therapeutic exercise;Balance training;Neuromuscular re-education;Patient/family education;Orthotic Fit/Training;Scar mobilization;Passive range of motion;Dry needling;Taping;Manual  techniques    PT Next Visit Plan  Endurance on SCI FIT - increase to 12 minutes.  terminal hip extension strength, hip abd strength-step ups on shorter step, SLS; continue balance, compliant surfaces, EC, gait with SPC as able,  PATIENT WEARS TLSO, BACK PRECAUTIONS.    PT Home Exercise Plan  [id number]    Consulted and Agree with Plan of Care  Patient       Patient will benefit from skilled therapeutic intervention in order to improve the following deficits and impairments:  Decreased activity tolerance, Decreased balance, Decreased endurance, Decreased mobility, Decreased range of motion, Decreased strength, Difficulty walking, Increased edema, Impaired sensation, Postural dysfunction, Pain  Visit Diagnosis: Muscle weakness (generalized)  Abnormal posture  Chronic bilateral low back pain, unspecified whether sciatica present  Difficulty in walking, not elsewhere classified  Repeated falls     Problem List Patient Active Problem List   Diagnosis Date Noted  . Chronic low back pain 02/09/2019  . Spinal stenosis of lumbar region with neurogenic claudication 11/22/2018  . Radiculopathy of lumbar region 05/31/2017  . HCAP (healthcare-associated pneumonia) 05/30/2017  . Leukocytosis 05/30/2017  . Lumbar pseudoarthrosis 05/27/2017  . Unstable angina ([facility]) 02/18/2017  . Primary localized osteoarthritis of right hip 10/03/2016  . Chest pain, rule out acute myocardial infarction 05/01/2016  . Diabetes mellitus type 2, controlled, with complications ([facility]) [id number]  . Obesity (BMI 30-39.9) 05/01/2016  . Left ventricular aneurysm 12/03/2015  . Ischemic cardiomyopathy   . CAD (coronary artery disease) 07/16/2015  . Hypokalemia 07/16/2015  . History of non-ST elevation myocardial infarction (NSTEMI) 07/15/2015  . Hypertension 07/15/2015  . Hyperlipidemia with target LDL less than 100 07/15/2015  . Back pain 07/15/2015  . GERD (gastroesophageal reflux disease) 06/18/2012  . Chronic  radicular low back pain 06/18/2012    Cameron Sprang, PT, MPT [facility] [address] [place], [place], [address] Phone: [phone]   Fax:  [phone] 07/07/19, 6:18 PM  Name: Thomas Mcclure MRN: [id number] Date of Birth: [birth year]

## 2019-07-08 ENCOUNTER — Encounter: Payer: Self-pay | Admitting: Rehabilitation

## 2019-07-08 ENCOUNTER — Ambulatory Visit: Payer: Medicare Other | Admitting: Rehabilitation

## 2019-07-08 DIAGNOSIS — R293 Abnormal posture: Secondary | ICD-10-CM

## 2019-07-08 DIAGNOSIS — R262 Difficulty in walking, not elsewhere classified: Secondary | ICD-10-CM

## 2019-07-08 DIAGNOSIS — M6281 Muscle weakness (generalized): Secondary | ICD-10-CM | POA: Diagnosis not present

## 2019-07-08 DIAGNOSIS — G8929 Other chronic pain: Secondary | ICD-10-CM

## 2019-07-08 DIAGNOSIS — R208 Other disturbances of skin sensation: Secondary | ICD-10-CM

## 2019-07-08 DIAGNOSIS — R296 Repeated falls: Secondary | ICD-10-CM

## 2019-07-08 NOTE — Therapy (Signed)
[facility] [address] [place], [place], [address] Phone: [phone]   Fax:  [phone]  Physical Therapy Treatment  Patient Details  Name: Thomas Mcclure MRN: [id number] Date of Birth: June 01, 1955 Referring Provider (PT): Newman Pies, MD   Encounter Date: 07/08/2019  PT End of Session - 07/08/19 1342    Visit Number  19    Number of Visits  31    Date for PT Re-Evaluation  08/24/19    Authorization Type  [facility] - 10th visit PN.  $0 copay as long as Medicaid is active.  VL: MN    Progress Note Due on Visit  20    PT Start Time  1146    PT Stop Time  1228    PT Time Calculation (min)  42 min    Equipment Utilized During Treatment  Back brace    Activity Tolerance  Patient tolerated treatment well    Behavior During Therapy  WFL for tasks assessed/performed       Past Medical History:  Diagnosis Date  . Baker's cyst    Left calf  . CAD in native artery, 07/15/15 PCI of RCA with DES 07/16/2015   a.   NSTEMI 5/17: LHC - pLAD 20, pLCx 20, OM1 30, pRCA 100, EF 45-50%>> PCI: 2.5 x 24 mm Promus DES to RCA  //  b.   Echo 5/17: mild LVH, EF 50-55%, no RWMA, mod RVE //  c. LHC 6/17: pLAD 20, pLCx 20, OM1 50, pRCA stent ok, EF 35-45% with mod sized inf wall and basal segment aneurysm  . CHF (congestive heart failure) ([facility])   . Chronic back pain    "down my back, down my legs" (02/18/2017)  . Constipation   . Constipation due to opioid therapy   . GERD (gastroesophageal reflux disease)    04/07/2019- not current  . Hyperlipidemia   . Hypertension    Dr. [name] [phone]  . Ischemic cardiomyopathy    a. LV-gram at time of LHC in 6/17 with EF 35-45%  //  b. Echo 7/17: EF 45-50%, inferior HK, grade 1 diastolic dysfunction, mildly dilated aortic root, moderately reduced RVSF, mild RAE  . Myocardial infarction ([facility]) 2017  . Neuromuscular disorder ([facility])    "with nerve damage"  . Numbness and tingling of both  lower extremities    "on the outside of both sides" (02/18/2017)  . Rheumatoid arthritis ([facility])    RA  . Tachycardia   . Type II diabetes mellitus ([facility])    diet controlled    Past Surgical History:  Procedure Laterality Date  . BACK SURGERY     x3  . CARDIAC CATHETERIZATION N/A 07/15/2015   Procedure: Left Heart Cath and Coronary Angiography;  Surgeon: [name], MD;  Location: [facility];  Service: Cardiovascular;  Laterality: N/A;  . CARDIAC CATHETERIZATION N/A 07/15/2015   Procedure: Coronary Stent Intervention;  Surgeon: [name], MD;  Location: [facility];  Service: Cardiovascular;  Laterality: N/A;  . CARDIAC CATHETERIZATION N/A 08/07/2015   Procedure: Left Heart Cath and Coronary Angiography;  Surgeon: [name], MD;  Location: [facility];  Service: Cardiovascular;  Laterality: N/A;  . CORONARY ANGIOPLASTY WITH STENT PLACEMENT  07/2015  . LEFT HEART CATH AND CORONARY ANGIOGRAPHY N/A 02/19/2017   Procedure: LEFT HEART CATH AND CORONARY ANGIOGRAPHY;  Surgeon: Martinique, Peter M, MD;  Location: [facility];  Service: Cardiovascular;  Laterality: N/A;  . LUMBAR FUSION  04/11/2019   REVISION OF THORACOLUMBAR FUSION   . LUMBAR SPINE SURGERY  03/2009  & 2012  . MASS EXCISION N/A 04/19/2012   Procedure: removal of posterior cervical lipoma;  Surgeon: [name], MD;  Location: [facility];  Service: Neurosurgery;  Laterality: N/A;  Removal of posterior cervical lipoma  . POPLITEAL SYNOVIAL CYST EXCISION Left    "opened up behind my knee; scraped out arthritis"  . POSTERIOR LUMBAR FUSION 4 LEVEL N/A 11/22/2018   Procedure: Posterior Lateral and Interbody fusion - Lumbar one-Lumbar two; explore fusion, posterior instrumentation and fusion Thoracic ten to the ilium;  Surgeon: Newman Pies, MD;  Location: [facility];  Service: Neurosurgery;  Laterality: N/A;  . SPINAL CORD STIMULATOR INSERTION N/A 01/07/2013   Procedure:  SPINAL CORD STIMULATOR  INSERTION;  Surgeon: [name], MD;  Location: [facility];  Service: Neurosurgery;  Laterality: N/A;  . SPINAL CORD STIMULATOR INSERTION N/A 02/09/2019   Procedure: REPLACEMENT OF LUMBAR SPINAL CORD STIMULATOR;  Surgeon: Newman Pies, MD;  Location: [facility];  Service: Neurosurgery;  Laterality: N/A;  Thoracic/Lumbar spine  . TONSILLECTOMY     patient denies  . TOTAL HIP ARTHROPLASTY Right 10/03/2016   Procedure: TOTAL HIP ARTHROPLASTY;  Surgeon: [name], MD;  Location: [facility];  Service: Orthopedics;  Laterality: Right;  . WISDOM TOOTH EXTRACTION     hx of    There were no vitals filed for this visit.  Subjective Assessment - 07/08/19 1151    Subjective  Pt reporting wanting to "take it easy" today.    Pertinent History  NSTEMI, HTN, HLD, chronic back pain with multiple surgeries and spinal cord stimulator, CAD, ischemic cardiomyopathy with coronary angioplasty with stent placement, L ventricular aneurysm, type 2 DM, obesity, R hip OA with R THA    Currently in Pain?  Yes    Pain Score  3     Pain Location  Back    Pain Orientation  Lower    Pain Descriptors / Indicators  Aching;Sore    Pain Type  Surgical pain;Chronic pain    Pain Onset  More than a month ago    Pain Frequency  Intermittent    Aggravating Factors   increased activity    Pain Relieving Factors  rest                        [facility] Adult PT Treatment/Exercise - 07/08/19 1157      Exercises   Exercises  Other Exercises    Other Exercises   Quadruped alternating UE extension x 10 reps with cues for taught core, progressing to alternating LE extension x 10 reps and finally alternating UE/LE extension x 10 reps with light facilitation for decreased lumbar arching and core activation.  Standing partial dead lift with 5lb kettle bell ( maintained back straight, knees straight, forward flexion at hips down to knees and engaging glutes/hamstrings to come back to upright).   Sit <>stand with 5lb  kettle bell holding out in front (flexion) x 10 reps.  Pt tolerated well, needed intermittent seated rest breaks in between.  Educated to perform hamstring stretching later today and tomorrow as he may have some soreness in back.       Lumbar Exercises: Aerobic   Other Aerobic Exercise  scifit stepper with BLEs only x 10 mins at level 4 resistance (elevated resistance today).  Pt moderately fatigued following task.  PT Short Term Goals - 06/23/19 1654      PT SHORT TERM GOAL #1   Title  Pt will demonstrate independence with ongoing and progressed HEP    Time  4    Period  Weeks    Status  New    Target Date  07/25/19      PT SHORT TERM GOAL #2   Title  Pt will improve DGI without AD by 2 points    Baseline  21/24    Time  4    Period  Weeks    Status  Revised    Target Date  07/25/19      PT SHORT TERM GOAL #3   Title  Pt will improve gait velocity without AD to >/= 2.62 ft/sec    Baseline  2.54 ft/sec without AD    Time  4    Period  Weeks    Status  New    Target Date  07/25/19      PT SHORT TERM GOAL #4   Title  Pt will negotiate 8 stairs with one rail, alternating sequence with supervision    Time  4    Period  Weeks    Status  Revised    Target Date  07/25/19        PT Long Term Goals - 06/23/19 1658      PT LONG TERM GOAL #1   Title  Pt will be independent with final HEP    Time  8    Period  Weeks    Status  Revised    Target Date  08/24/19      PT LONG TERM GOAL #2   Title  Pt will improve 6MWT distance to >/= 1200' with LRAD in order to indicate improved endurance.    Baseline  1189'    Time  8    Period  Weeks    Status  Revised    Target Date  08/24/19      PT LONG TERM GOAL #3   Title  Pt will improve gait speed to >/= 3.0 ft/sec without AD    Time  8    Period  Weeks    Status  Revised    Target Date  08/24/19      PT LONG TERM GOAL #4   Title  Pt will improve DGI to >/=23/24 without AD to indicate decreased falls  when ambulating in the community    Baseline  21/24    Time  8    Period  Weeks    Status  Achieved    Target Date  08/24/19      PT LONG TERM GOAL #5   Title  Pt will ambulate 500' over unlevel outdoor paved surfaces w/ SPC at mod I level in order to indicate improved community mobility.    Time  8    Period  Weeks    Status  On-going    Target Date  08/24/19            Plan - 07/08/19 1343    Clinical Impression Statement  Skilled session focused on overall strengthening and cardiovascular endurance with seated scifit.  Pt able to tolerate increased resistance but did decrease time to 10 mins.  Remainder of session focused on core activation/stabilization.  Pt tolerated well.    Personal Factors and Comorbidities  Comorbidity 3+;Fitness;Past/Current Experience    Comorbidities  NSTEMI, HTN, HLD, chronic back pain with multiple surgeries and  spinal cord stimulator, CAD, ischemic cardiomyopathy with coronary angioplasty with stent placement, L ventricular aneurysm, type 2 DM, obesity, R hip OA with R THA    Examination-Activity Limitations  Bed Mobility;Bend;Dressing;Lift;Locomotion Level;Stairs;Stand    Examination-Participation Restrictions  Cleaning;Community Activity    Stability/Clinical Decision Making  Evolving/Moderate complexity    Rehab Potential  Good    PT Frequency  2x / week    PT Duration  8 weeks    PT Treatment/Interventions  ADLs/Self Care Home Management;Aquatic Therapy;Cryotherapy;Moist Heat;DME Instruction;Gait training;Stair training;Functional mobility training;Therapeutic activities;Therapeutic exercise;Balance training;Neuromuscular re-education;Patient/family education;Orthotic Fit/Training;Scar mobilization;Passive range of motion;Dry needling;Taping;Manual techniques    PT Next Visit Plan  Endurance on SCI FIT - increase to 12 minutes at level 4 if able.  terminal hip extension strength, hip abd strength-step ups on shorter step, SLS; continue balance,  compliant surfaces, EC, gait with SPC as able,  PATIENT WEARS TLSO, BACK PRECAUTIONS.    PT Home Exercise Plan  [id number]    Consulted and Agree with Plan of Care  Patient       Patient will benefit from skilled therapeutic intervention in order to improve the following deficits and impairments:  Decreased activity tolerance, Decreased balance, Decreased endurance, Decreased mobility, Decreased range of motion, Decreased strength, Difficulty walking, Increased edema, Impaired sensation, Postural dysfunction, Pain  Visit Diagnosis: Muscle weakness (generalized)  Abnormal posture  Chronic bilateral low back pain, unspecified whether sciatica present  Difficulty in walking, not elsewhere classified  Repeated falls  Other disturbances of skin sensation     Problem List Patient Active Problem List   Diagnosis Date Noted  . Chronic low back pain 02/09/2019  . Spinal stenosis of lumbar region with neurogenic claudication 11/22/2018  . Radiculopathy of lumbar region 05/31/2017  . HCAP (healthcare-associated pneumonia) 05/30/2017  . Leukocytosis 05/30/2017  . Lumbar pseudoarthrosis 05/27/2017  . Unstable angina ([facility]) 02/18/2017  . Primary localized osteoarthritis of right hip 10/03/2016  . Chest pain, rule out acute myocardial infarction 05/01/2016  . Diabetes mellitus type 2, controlled, with complications ([facility]) [id number]  . Obesity (BMI 30-39.9) 05/01/2016  . Left ventricular aneurysm 12/03/2015  . Ischemic cardiomyopathy   . CAD (coronary artery disease) 07/16/2015  . Hypokalemia 07/16/2015  . History of non-ST elevation myocardial infarction (NSTEMI) 07/15/2015  . Hypertension 07/15/2015  . Hyperlipidemia with target LDL less than 100 07/15/2015  . Back pain 07/15/2015  . GERD (gastroesophageal reflux disease) 06/18/2012  . Chronic radicular low back pain 06/18/2012    Cameron Sprang, PT, MPT [facility] [address] [place], [place], [address] Phone: [phone]   Fax:  [phone] 07/08/19, 1:45 PM  Name: Thomas Mcclure MRN: [id number] Date of Birth: 12-Mar-1955

## 2019-07-14 ENCOUNTER — Ambulatory Visit: Payer: Medicare Other | Admitting: Rehabilitation

## 2019-07-15 ENCOUNTER — Ambulatory Visit: Payer: Medicare Other | Admitting: Rehabilitation

## 2019-07-18 ENCOUNTER — Encounter: Payer: Self-pay | Admitting: Rehabilitation

## 2019-07-18 ENCOUNTER — Other Ambulatory Visit: Payer: Self-pay

## 2019-07-18 ENCOUNTER — Ambulatory Visit: Payer: Medicare Other | Admitting: Rehabilitation

## 2019-07-18 DIAGNOSIS — M6281 Muscle weakness (generalized): Secondary | ICD-10-CM | POA: Diagnosis not present

## 2019-07-18 DIAGNOSIS — R293 Abnormal posture: Secondary | ICD-10-CM

## 2019-07-18 DIAGNOSIS — R208 Other disturbances of skin sensation: Secondary | ICD-10-CM

## 2019-07-18 DIAGNOSIS — G8929 Other chronic pain: Secondary | ICD-10-CM

## 2019-07-18 DIAGNOSIS — R262 Difficulty in walking, not elsewhere classified: Secondary | ICD-10-CM

## 2019-07-18 NOTE — Therapy (Signed)
[facility] [address] [place], [place], [address] Phone: [phone]   Fax:  [phone]  Physical Therapy Treatment and Progress Note  Patient Details  Name: Thomas Mcclure MRN: [id number] Date of Birth: Jan 02, 1956 Referring Provider (PT): Newman Pies, MD   Encounter Date: 07/18/2019  PT End of Session - 07/18/19 1249    Visit Number  20    Number of Visits  31    Date for PT Re-Evaluation  08/24/19    Authorization Type  [employer] - [place] - 10th visit PN.  $0 copay as long as Medicaid is active.  VL: MN    Progress Note Due on Visit  30    PT Start Time  1152    PT Stop Time  1236    PT Time Calculation (min)  44 min    Equipment Utilized During Treatment  Back brace    Activity Tolerance  Patient tolerated treatment well    Behavior During Therapy  WFL for tasks assessed/performed       Past Medical History:  Diagnosis Date  . Baker's cyst    Left calf  . CAD in native artery, 07/15/15 PCI of RCA with DES 07/16/2015   a.   NSTEMI 5/17: LHC - pLAD 20, pLCx 20, OM1 30, pRCA 100, EF 45-50%>> PCI: 2.5 x 24 mm Promus DES to RCA  //  b.   Echo 5/17: mild LVH, EF 50-55%, no RWMA, mod RVE //  c. LHC 6/17: pLAD 20, pLCx 20, OM1 50, pRCA stent ok, EF 35-45% with mod sized inf wall and basal segment aneurysm  . CHF (congestive heart failure) ([facility])   . Chronic back pain    "down my back, down my legs" (02/18/2017)  . Constipation   . Constipation due to opioid therapy   . GERD (gastroesophageal reflux disease)    04/07/2019- not current  . Hyperlipidemia   . Hypertension    Dr. [name] [phone]  . Ischemic cardiomyopathy    a. LV-gram at time of LHC in 6/17 with EF 35-45%  //  b. Echo 7/17: EF 45-50%, inferior HK, grade 1 diastolic dysfunction, mildly dilated aortic root, moderately reduced RVSF, mild RAE  . Myocardial infarction ([facility]) 2017  . Neuromuscular disorder ([facility])    "with nerve damage"  . Numbness and  tingling of both lower extremities    "on the outside of both sides" (02/18/2017)  . Rheumatoid arthritis ([facility])    RA  . Tachycardia   . Type II diabetes mellitus ([facility])    diet controlled    Past Surgical History:  Procedure Laterality Date  . BACK SURGERY     x3  . CARDIAC CATHETERIZATION N/A 07/15/2015   Procedure: Left Heart Cath and Coronary Angiography;  Surgeon: [name], MD;  Location: [facility];  Service: Cardiovascular;  Laterality: N/A;  . CARDIAC CATHETERIZATION N/A 07/15/2015   Procedure: Coronary Stent Intervention;  Surgeon: [name], MD;  Location: [facility];  Service: Cardiovascular;  Laterality: N/A;  . CARDIAC CATHETERIZATION N/A 08/07/2015   Procedure: Left Heart Cath and Coronary Angiography;  Surgeon: [name], MD;  Location: [facility];  Service: Cardiovascular;  Laterality: N/A;  . CORONARY ANGIOPLASTY WITH STENT PLACEMENT  07/2015  . LEFT HEART CATH AND CORONARY ANGIOGRAPHY N/A 02/19/2017   Procedure: LEFT HEART CATH AND CORONARY ANGIOGRAPHY;  Surgeon: Martinique, Peter M, MD;  Location: [facility];  Service: Cardiovascular;  Laterality: N/A;  . LUMBAR FUSION  04/11/2019   REVISION OF THORACOLUMBAR FUSION   . LUMBAR SPINE SURGERY  03/2009  & 2012  . MASS EXCISION N/A 04/19/2012   Procedure: removal of posterior cervical lipoma;  Surgeon: [name], MD;  Location: [facility];  Service: Neurosurgery;  Laterality: N/A;  Removal of posterior cervical lipoma  . POPLITEAL SYNOVIAL CYST EXCISION Left    "opened up behind my knee; scraped out arthritis"  . POSTERIOR LUMBAR FUSION 4 LEVEL N/A 11/22/2018   Procedure: Posterior Lateral and Interbody fusion - Lumbar one-Lumbar two; explore fusion, posterior instrumentation and fusion Thoracic ten to the ilium;  Surgeon: Newman Pies, MD;  Location: [facility];  Service: Neurosurgery;  Laterality: N/A;  . SPINAL CORD STIMULATOR INSERTION N/A 01/07/2013   Procedure:  SPINAL  CORD STIMULATOR INSERTION;  Surgeon: [name], MD;  Location: [facility];  Service: Neurosurgery;  Laterality: N/A;  . SPINAL CORD STIMULATOR INSERTION N/A 02/09/2019   Procedure: REPLACEMENT OF LUMBAR SPINAL CORD STIMULATOR;  Surgeon: Newman Pies, MD;  Location: [facility];  Service: Neurosurgery;  Laterality: N/A;  Thoracic/Lumbar spine  . TONSILLECTOMY     patient denies  . TOTAL HIP ARTHROPLASTY Right 10/03/2016   Procedure: TOTAL HIP ARTHROPLASTY;  Surgeon: [name], MD;  Location: [facility];  Service: Orthopedics;  Laterality: Right;  . WISDOM TOOTH EXTRACTION     hx of    There were no vitals filed for this visit.  Subjective Assessment - 07/18/19 1155    Subjective  I've been up running since I got up this morning.    Pertinent History  NSTEMI, HTN, HLD, chronic back pain with multiple surgeries and spinal cord stimulator, CAD, ischemic cardiomyopathy with coronary angioplasty with stent placement, L ventricular aneurysm, type 2 DM, obesity, R hip OA with R THA    Limitations  Standing;Walking;House hold activities    Patient Stated Goals  To walk without the RW    Currently in Pain?  Yes    Pain Score  2     Pain Location  Back    Pain Orientation  Lower    Pain Descriptors / Indicators  Aching;Sore    Pain Type  Chronic pain    Pain Onset  More than a month ago    Pain Frequency  Intermittent    Aggravating Factors   increased activity    Pain Relieving Factors  rest                        [facility] Adult PT Treatment/Exercise - 07/18/19 1203      Neuro Re-ed    Neuro Re-ed Details   High level balance in // bars: Standing on BOSU (black top up) maintaining balance with feet apart x 2 sets of 20 secs, elevating arms into flex alternating UEs x 10 reps, alternating UE flex with opposite head turn x 10 reps, (blue top up) single leg in stance while opposite legs moves into abd x 10 reps then vice versa, single leg in stance on BOSU while opposite moves  from floor and up to tapping chair seat x 10 reps on each side.  All with intermittent UE support.   Pt needing standing rest in between tasks.       Exercises   Exercises  Other Exercises    Other Exercises   Picking up 10lb kettle bell from approx 6" from floor and elevating overhead  x 10 reps on each side with cues for back straight and proper squat with lift.        Lumbar Exercises: Aerobic   Other Aerobic Exercise  Scifit stepper with LEs only at level 3.5 resistance maintaining rpms at 60-70's x 10 mins.  Tolerated well but with moderate fatigue, needing period of rest following task.         Education:  Pt verbalized not needing RW, therefore okayed him to ambulate in/out of clinic without AD, however if doing longer distances or fatigued, to use SPC.  Pt verbalized understanding.      PT Education - 07/18/19 1249    Education Details  Discussed that we would check STGs at next session and if meeting them plus LTGs we may wrap up, if not, will continue into next 4 weeks.    Person(s) Educated  Patient    Methods  Explanation    Comprehension  Verbalized understanding       PT Short Term Goals - 06/23/19 1654      PT SHORT TERM GOAL #1   Title  Pt will demonstrate independence with ongoing and progressed HEP    Time  4    Period  Weeks    Status  New    Target Date  07/25/19      PT SHORT TERM GOAL #2   Title  Pt will improve DGI without AD by 2 points    Baseline  21/24    Time  4    Period  Weeks    Status  Revised    Target Date  07/25/19      PT SHORT TERM GOAL #3   Title  Pt will improve gait velocity without AD to >/= 2.62 ft/sec    Baseline  2.54 ft/sec without AD    Time  4    Period  Weeks    Status  New    Target Date  07/25/19      PT SHORT TERM GOAL #4   Title  Pt will negotiate 8 stairs with one rail, alternating sequence with supervision    Time  4    Period  Weeks    Status  Revised    Target Date  07/25/19        PT Long Term Goals -  06/23/19 1658      PT LONG TERM GOAL #1   Title  Pt will be independent with final HEP    Time  8    Period  Weeks    Status  Revised    Target Date  08/24/19      PT LONG TERM GOAL #2   Title  Pt will improve 6MWT distance to >/= 1200' with LRAD in order to indicate improved endurance.    Baseline  1189'    Time  8    Period  Weeks    Status  Revised    Target Date  08/24/19      PT LONG TERM GOAL #3   Title  Pt will improve gait speed to >/= 3.0 ft/sec without AD    Time  8    Period  Weeks    Status  Revised    Target Date  08/24/19      PT LONG TERM GOAL #4   Title  Pt will improve DGI to >/=23/24 without AD to indicate decreased falls when ambulating in the community    Baseline  21/24  Time  8    Period  Weeks    Status  Achieved    Target Date  08/24/19      PT LONG TERM GOAL #5   Title  Pt will ambulate 500' over unlevel outdoor paved surfaces w/ SPC at mod I level in order to indicate improved community mobility.    Time  8    Period  Weeks    Status  On-going    Target Date  08/24/19         Progress Note Reporting Period 06/06/19 to 07/18/19  See note below for Objective Data and Assessment of Progress/Goals.         Plan - 07/18/19 1250    Clinical Impression Statement  Session focused on cardiovascular endurance with sci fit stepper which he continues to be able to do at increased resistance for 10 mins but with moderate fatigue.  Also continue with high level balance and R hip strengthening along with beginning to load pt into over head motions.  He continues to make progress with strength and have okayed pt to ambulate in/out of clinic and shorter distances without AD.  Advise to still use SPC for longer distances or when fatigued.    Personal Factors and Comorbidities  Comorbidity 3+;Fitness;Past/Current Experience    Comorbidities  NSTEMI, HTN, HLD, chronic back pain with multiple surgeries and spinal cord stimulator, CAD, ischemic  cardiomyopathy with coronary angioplasty with stent placement, L ventricular aneurysm, type 2 DM, obesity, R hip OA with R THA    Examination-Activity Limitations  Bed Mobility;Bend;Dressing;Lift;Locomotion Level;Stairs;Stand    Examination-Participation Restrictions  Cleaning;Community Activity    Stability/Clinical Decision Making  Evolving/Moderate complexity    Rehab Potential  Good    PT Frequency  2x / week    PT Duration  8 weeks    PT Treatment/Interventions  ADLs/Self Care Home Management;Aquatic Therapy;Cryotherapy;Moist Heat;DME Instruction;Gait training;Stair training;Functional mobility training;Therapeutic activities;Therapeutic exercise;Balance training;Neuromuscular re-education;Patient/family education;Orthotic Fit/Training;Scar mobilization;Passive range of motion;Dry needling;Taping;Manual techniques    PT Next Visit Plan  check STGs (if meeting LTGs too then DC) Endurance on SCI FIT - increase to 12 minutes at level 4 if able.  terminal hip extension strength, hip abd strength-step ups on shorter step, SLS; continue balance, compliant surfaces, EC, gait with SPC as able,  PATIENT WEARS TLSO, BACK PRECAUTIONS.    PT Home Exercise Plan  [id number]    Consulted and Agree with Plan of Care  Patient       Patient will benefit from skilled therapeutic intervention in order to improve the following deficits and impairments:  Decreased activity tolerance, Decreased balance, Decreased endurance, Decreased mobility, Decreased range of motion, Decreased strength, Difficulty walking, Increased edema, Impaired sensation, Postural dysfunction, Pain  Visit Diagnosis: Muscle weakness (generalized)  Abnormal posture  Chronic bilateral low back pain, unspecified whether sciatica present  Difficulty in walking, not elsewhere classified  Other disturbances of skin sensation     Problem List Patient Active Problem List   Diagnosis Date Noted  . Chronic low back pain 02/09/2019  .  Spinal stenosis of lumbar region with neurogenic claudication 11/22/2018  . Radiculopathy of lumbar region 05/31/2017  . HCAP (healthcare-associated pneumonia) 05/30/2017  . Leukocytosis 05/30/2017  . Lumbar pseudoarthrosis 05/27/2017  . Unstable angina ([facility]) 02/18/2017  . Primary localized osteoarthritis of right hip 10/03/2016  . Chest pain, rule out acute myocardial infarction 05/01/2016  . Diabetes mellitus type 2, controlled, with complications ([facility]) [id number]  . Obesity (BMI 30-39.9) 05/01/2016  .  Left ventricular aneurysm 12/03/2015  . Ischemic cardiomyopathy   . CAD (coronary artery disease) 07/16/2015  . Hypokalemia 07/16/2015  . History of non-ST elevation myocardial infarction (NSTEMI) 07/15/2015  . Hypertension 07/15/2015  . Hyperlipidemia with target LDL less than 100 07/15/2015  . Back pain 07/15/2015  . GERD (gastroesophageal reflux disease) 06/18/2012  . Chronic radicular low back pain 06/18/2012    Cameron Sprang, PT, MPT [facility] [address] [place], [place], [address] Phone: [phone]   Fax:  [phone] 07/18/19, 12:53 PM  Name: FREDDIE RUMLEY MRN: [id number] Date of Birth: 02-10-1956

## 2019-07-21 ENCOUNTER — Other Ambulatory Visit: Payer: Self-pay

## 2019-07-21 ENCOUNTER — Ambulatory Visit: Payer: Medicare Other | Admitting: Rehabilitation

## 2019-07-21 ENCOUNTER — Encounter: Payer: Self-pay | Admitting: Rehabilitation

## 2019-07-21 DIAGNOSIS — M545 Low back pain, unspecified: Secondary | ICD-10-CM

## 2019-07-21 DIAGNOSIS — M6281 Muscle weakness (generalized): Secondary | ICD-10-CM | POA: Diagnosis not present

## 2019-07-21 DIAGNOSIS — R262 Difficulty in walking, not elsewhere classified: Secondary | ICD-10-CM

## 2019-07-21 DIAGNOSIS — R293 Abnormal posture: Secondary | ICD-10-CM

## 2019-07-21 DIAGNOSIS — R208 Other disturbances of skin sensation: Secondary | ICD-10-CM

## 2019-07-21 NOTE — Therapy (Signed)
[facility] [address] [place], [place], [address] Phone: [phone]   Fax:  [phone]  Physical Therapy Treatment and D/C Summary   Patient Details  Name: RION CATALA MRN: [id number] Date of Birth: 10-21-1955 Referring Provider (PT): Newman Pies, MD   Encounter Date: 07/21/2019  PT End of Session - 07/21/19 1636    Visit Number  21    Number of Visits  31    Date for PT Re-Evaluation  08/24/19    Authorization Type  [place] - 10th visit PN.  $0 copay as long as Medicaid is active.  VL: MN    Progress Note Due on Visit  30    PT Start Time  1448    PT Stop Time  1530    PT Time Calculation (min)  42 min    Equipment Utilized During Treatment  Back brace    Activity Tolerance  Patient tolerated treatment well    Behavior During Therapy  WFL for tasks assessed/performed       Past Medical History:  Diagnosis Date  . Baker's cyst    Left calf  . CAD in native artery, 07/15/15 PCI of RCA with DES 07/16/2015   a.   NSTEMI 5/17: LHC - pLAD 20, pLCx 20, OM1 30, pRCA 100, EF 45-50%>> PCI: 2.5 x 24 mm Promus DES to RCA  //  b.   Echo 5/17: mild LVH, EF 50-55%, no RWMA, mod RVE //  c. LHC 6/17: pLAD 20, pLCx 20, OM1 50, pRCA stent ok, EF 35-45% with mod sized inf wall and basal segment aneurysm  . CHF (congestive heart failure) ([place])   . Chronic back pain    "down my back, down my legs" (02/18/2017)  . Constipation   . Constipation due to opioid therapy   . GERD (gastroesophageal reflux disease)    04/07/2019- not current  . Hyperlipidemia   . Hypertension    Dr. [name] [phone]  . Ischemic cardiomyopathy    a. LV-gram at time of LHC in 6/17 with EF 35-45%  //  b. Echo 7/17: EF 45-50%, inferior HK, grade 1 diastolic dysfunction, mildly dilated aortic root, moderately reduced RVSF, mild RAE  . Myocardial infarction ([facility]) 2017  . 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 Numbness and  tingling of both lower extremities    "on the outside of both sides" (02/18/2017)  . Rheumatoid arthritis ([facility])    RA  . Tachycardia   . Type II diabetes mellitus ([facility] [place])    diet controlled    Past Surgical History:  Procedure Laterality Date  . BACK SURGERY     x3  . CARDIAC CATHETERIZATION N/A 07/15/2015   Procedure: Left Heart Cath and Coronary Angiography;  Surgeon: [name], MD;  Location: [facility];  Service: Cardiovascular;  Laterality: N/A;  . CARDIAC CATHETERIZATION N/A 07/15/2015   Procedure: Coronary Stent Intervention;  Surgeon: [name], MD;  Location: [facility];  Service: Cardiovascular;  Laterality: N/A;  . CARDIAC CATHETERIZATION N/A 08/07/2015   Procedure: Left Heart Cath and Coronary Angiography;  Surgeon: [name], MD;  Location: [facility];  Service: Cardiovascular;  Laterality: N/A;  . CORONARY ANGIOPLASTY WITH STENT PLACEMENT  07/2015  . LEFT HEART CATH AND CORONARY ANGIOGRAPHY N/A 02/19/2017   Procedure: LEFT HEART CATH AND CORONARY ANGIOGRAPHY;  Surgeon: Martinique, Peter M, MD;  Location: [facility];  Service: Cardiovascular;  Laterality: N/A;  . LUMBAR FUSION  04/11/2019   REVISION OF THORACOLUMBAR FUSION   . LUMBAR SPINE SURGERY  03/2009  & 2012  . MASS EXCISION N/A 04/19/2012   Procedure: removal of posterior cervical lipoma;  Surgeon: [name], MD;  Location: [facility];  Service: Neurosurgery;  Laterality: N/A;  Removal of posterior cervical lipoma  . POPLITEAL SYNOVIAL CYST EXCISION Left    "opened up behind my knee; scraped out arthritis"  . POSTERIOR LUMBAR FUSION 4 LEVEL N/A 11/22/2018   Procedure: Posterior Lateral and Interbody fusion - Lumbar one-Lumbar two; explore fusion, posterior instrumentation and fusion Thoracic ten to the ilium;  Surgeon: Newman Pies, MD;  Location: [facility];  Service: Neurosurgery;  Laterality: N/A;  . SPINAL CORD STIMULATOR INSERTION N/A 01/07/2013   Procedure:  SPINAL  CORD STIMULATOR INSERTION;  Surgeon: [name], MD;  Location: [facility];  Service: Neurosurgery;  Laterality: N/A;  . SPINAL CORD STIMULATOR INSERTION N/A 02/09/2019   Procedure: REPLACEMENT OF LUMBAR SPINAL CORD STIMULATOR;  Surgeon: Newman Pies, MD;  Location: [facility];  Service: Neurosurgery;  Laterality: N/A;  Thoracic/Lumbar spine  . TONSILLECTOMY     patient denies  . TOTAL HIP ARTHROPLASTY Right 10/03/2016   Procedure: TOTAL HIP ARTHROPLASTY;  Surgeon: [name], MD;  Location: [facility];  Service: Orthopedics;  Laterality: Right;  . WISDOM TOOTH EXTRACTION     hx of    There were no vitals filed for this visit.  Subjective Assessment - 07/21/19 1450    Subjective  Feeling great today    Pertinent History  NSTEMI, HTN, HLD, chronic back pain with multiple surgeries and spinal cord stimulator, CAD, ischemic cardiomyopathy with coronary angioplasty with stent placement, L ventricular aneurysm, type 2 DM, obesity, R hip OA with R THA    Limitations  Standing;Walking;House hold activities    Patient Stated Goals  To walk without the RW    Currently in Pain?  No/denies         [facility] PT Assessment - 07/21/19 1454      Ambulation/Gait   Ambulation/Gait  Yes    Ambulation/Gait Assistance  6: Modified independent (Device/Increase time)    Ambulation/Gait Assistance Details  Mod I throughout session with SPC and without AD at times.      Stairs  Yes    Stairs Assistance  6: Modified independent (Device/Increase time)    Stair Management Technique  One rail Right;Alternating pattern;Forwards    Number of Stairs  8    Height of Stairs  6      6 Minute Walk- Baseline   6 Minute Walk- Baseline  yes    BP (mmHg)  118/76    HR (bpm)  101    02 Sat (%RA)  96 %    Modified Borg Scale for Dyspnea  0- Nothing at all    Perceived Rate of Exertion (Borg)  6-      6 Minute walk- Post Test   6 Minute Walk Post Test  yes    BP (mmHg)  142/81    HR (bpm)  108    02 Sat (%RA)   97 %    Modified Borg Scale for Dyspnea  2- Mild shortness of breath    Perceived Rate of Exertion (Borg)  11- Fairly light      6 minute walk test results    Aerobic Endurance Distance Walked  1042    Endurance additional comments  with  SPC (did 2 mins without device, then turmed to retrieve cane)      Standardized Balance Assessment   Standardized Balance Assessment  Dynamic Gait Index      Dynamic Gait Index   Level Surface  Normal    Change in Gait Speed  Normal    Gait with Horizontal Head Turns  Mild Impairment    Gait with Vertical Head Turns  Normal    Gait and Pivot Turn  Normal    Step Over Obstacle  Normal    Step Around Obstacles  Normal    Steps  Mild Impairment    Total Score  22        Self Care:  Discussed that when doing cardiovascular training, to utilize RW as he can push himself more into appropriate RPE with less gait compensations and pain.                      PT Education - 07/21/19 1635    Education Details  Progress with goals, importance of performing balance exercises at home    Person(s) Educated  Patient    Methods  Explanation    Comprehension  Verbalized understanding       PT Short Term Goals - 07/21/19 1451      PT SHORT TERM GOAL #1   Title  Pt will demonstrate independence with ongoing and progressed HEP    Baseline  met per pt report    Time  4    Period  Weeks    Status  Achieved    Target Date  07/25/19      PT SHORT TERM GOAL #2   Title  Pt will improve DGI without AD by 2 points    Baseline  21/24 to 22/24 on 07/21/19    Time  4    Period  Weeks    Status  Partially Met    Target Date  07/25/19      PT SHORT TERM GOAL #3   Title  Pt will improve gait velocity without AD to >/= 2.62 ft/sec    Baseline  2.54 ft/sec without AD, 3.56 ft/sec with SPC and 3.71 ft/sec without AD on 07/21/19    Time  4    Period  Weeks    Status  Achieved    Target Date  07/25/19      PT SHORT TERM GOAL #4   Title  Pt will  negotiate 8 stairs with one rail, alternating sequence with supervision    Baseline  8 steps with single rail reciprocal at mod I level    Time  4    Period  Weeks    Status  Achieved    Target Date  07/25/19        PT Long Term Goals - 07/21/19 1508      PT LONG TERM GOAL #1   Title  Pt will be independent with final HEP    Baseline  met per pt report    Time  8    Period  Weeks    Status  Achieved      PT LONG TERM GOAL #2   Title  Pt will improve 6MWT distance to >/= 1200' with LRAD in order to indicate improved endurance.    Baseline  1189' to 1042' with SPC on 07/21/19 (did with RW for last test)    Time  8    Period  Weeks    Status  Not Met      PT LONG TERM GOAL #3   Title  Pt will improve gait speed to >/= 3.0 ft/sec without AD    Baseline  3.71 ft/sec without AD    Time  8    Period  Weeks    Status  Achieved      PT LONG TERM GOAL #4   Title  Pt will improve DGI to >/=23/24 without AD to indicate decreased falls when ambulating in the community    Baseline  21/24 to 22/24 on 07/21/19    Time  8    Period  Weeks    Status  Partially Met      PT LONG TERM GOAL #5   Title  Pt will ambulate 500' over unlevel outdoor paved surfaces w/ SPC at mod I level in order to indicate improved community mobility.    Baseline  did not have time to perform, however pt is meeting per verbal report at home    Time  8    Period  Weeks    Status  Achieved         PHYSICAL THERAPY DISCHARGE SUMMARY  Visits from Start of Care: 21  Current functional level related to goals / functional outcomes: See goals above   Remaining deficits: Pt continues to have tightness in hips, weakness in proximal hip abd and high level balance deficits.    Education / Equipment: HEP to address continued deficits.   Plan: Patient agrees to discharge.  Patient goals were partially met. Patient is being discharged due to meeting the stated rehab goals.  ?????         Plan - 07/21/19  1636    Clinical Impression Statement  Skilled session focused on assessment of STG/LTGs as pt verbalizing he may be ready to wrap up and based on progress in previous sessions.  Pt has met 3/4 STGs (partially meeting goal for DGI) and has met 3/5 LTGs (partially met balance goal and did not meet 6MWT goal).  Discussed that PT continues to recommend use of RW when walking for cardiovascular endurance to ensure better gait quality and increased distance.  PT discussed that we could do a few more visits to work on balance, however pt is happy with his progress stating "I'm better than I've been in 10 years."  Will D/C at this time.    Personal Factors and Comorbidities  Comorbidity 3+;Fitness;Past/Current Experience    Comorbidities  NSTEMI, HTN, HLD, chronic back pain with multiple surgeries and spinal cord stimulator, CAD, ischemic cardiomyopathy with coronary angioplasty with stent placement, L ventricular aneurysm, type 2 DM, obesity, R hip OA with R THA    Examination-Activity Limitations  Bed Mobility;Bend;Dressing;Lift;Locomotion Level;Stairs;Stand    Examination-Participation Restrictions  Cleaning;Community Activity    Stability/Clinical Decision Making  Evolving/Moderate complexity    Rehab Potential  Good    PT Frequency  2x / week    PT Duration  8 weeks    PT Treatment/Interventions  ADLs/Self Care Home Management;Aquatic Therapy;Cryotherapy;Moist Heat;DME Instruction;Gait training;Stair training;Functional mobility training;Therapeutic activities;Therapeutic exercise;Balance training;Neuromuscular re-education;Patient/family education;Orthotic Fit/Training;Scar mobilization;Passive range of motion;Dry needling;Taping;Manual techniques    PT Home Exercise Plan  [id number]    Consulted and Agree with Plan of Care  Patient       Patient will benefit from skilled therapeutic intervention in order to improve the following deficits and impairments:  Decreased activity tolerance, Decreased  balance, Decreased endurance, Decreased mobility, Decreased range of motion, Decreased strength, Difficulty walking,  Increased edema, Impaired sensation, Postural dysfunction, Pain  Visit Diagnosis: Muscle weakness (generalized)  Abnormal posture  Chronic bilateral low back pain, unspecified whether sciatica present  Difficulty in walking, not elsewhere classified  Other disturbances of skin sensation     Problem List Patient Active Problem List   Diagnosis Date Noted  . Chronic low back pain 02/09/2019  . Spinal stenosis of lumbar region with neurogenic claudication 11/22/2018  . Radiculopathy of lumbar region 05/31/2017  . HCAP (healthcare-associated pneumonia) 05/30/2017  . Leukocytosis 05/30/2017  . Lumbar pseudoarthrosis 05/27/2017  . Unstable angina ([facility]) 02/18/2017  . Primary localized osteoarthritis of right hip 10/03/2016  . Chest pain, rule out acute myocardial infarction 05/01/2016  . Diabetes mellitus type 2, controlled, with complications ([place]) 22/30/0979  . Obesity (BMI 30-39.9) 05/01/2016  . Left ventricular aneurysm 12/03/2015  . Ischemic cardiomyopathy   . CAD (coronary artery disease) 07/16/2015  . Hypokalemia 07/16/2015  . History of non-ST elevation myocardial infarction (NSTEMI) 07/15/2015  . Hypertension 07/15/2015  . Hyperlipidemia with target LDL less than 100 07/15/2015  . Back pain 07/15/2015  . GERD (gastroesophageal reflux disease) 06/18/2012  . Chronic radicular low back pain 06/18/2012    Cameron Sprang, PT, MPT [facility] [address] [place], [place], [address] Phone: [phone]   Fax:  [phone] 07/21/19, 4:43 PM  Name: LASH MATULICH MRN: [id number] Date of Birth: 1955/08/14

## 2019-07-27 ENCOUNTER — Ambulatory Visit: Payer: Medicare Other | Admitting: Physical Therapy

## 2019-08-01 ENCOUNTER — Ambulatory Visit: Payer: Medicare Other | Admitting: Physical Therapy

## 2019-08-04 ENCOUNTER — Ambulatory Visit: Payer: Medicare Other | Admitting: Physical Therapy

## 2019-08-05 ENCOUNTER — Ambulatory Visit: Payer: Medicare Other | Admitting: Physical Therapy

## 2019-08-08 ENCOUNTER — Ambulatory Visit: Payer: Medicare Other | Admitting: Physical Therapy

## 2019-08-10 ENCOUNTER — Ambulatory Visit: Payer: Medicare Other | Admitting: Physical Therapy

## 2019-08-11 NOTE — Progress Notes (Signed)
Cardiology Office Note:    Date:  08/15/2019   ID:  Thomas Mcclure, DOB [birth year], MRN [id number]  PCP:  Vassie Moment, MD  Cardiologist:  Sinclair Grooms, MD   Referring MD: Vassie Moment, MD   Chief Complaint  Patient presents with  . Coronary Artery Disease    History of Present Illness:    Thomas Mcclure is a 64 y.o. male with a hx of NSTEMI in 2017status post DES to the RCA. MI wascomplicated byIBLV aneurysm and chroniccombinedsystolic/diastolicheart QHUTMLY(YT03-54 percent 2020), diabetes mellitusII, hypertension, hyperlipidemia, and chronic back pain status post spinal stimulator. Cath in 2018 demonstrated patent stent.  Thomas Mcclure is doing well.  He denies angina.  He has not needed nitroglycerin.  He denies shortness of breath.  There is no peripheral edema.  He has not had syncope.  He denies palpitations and syncope.  There is no orthopnea.  Past Medical History:  Diagnosis Date  . Baker's cyst    Left calf  . CAD in native artery, 07/15/15 PCI of RCA with DES 07/16/2015   a.   NSTEMI 5/17: LHC - pLAD 20, pLCx 20, OM1 30, pRCA 100, EF 45-50%>> PCI: 2.5 x 24 mm Promus DES to RCA  //  b.   Echo 5/17: mild LVH, EF 50-55%, no RWMA, mod RVE //  c. LHC 6/17: pLAD 20, pLCx 20, OM1 50, pRCA stent ok, EF 35-45% with mod sized inf wall and basal segment aneurysm  . CHF (congestive heart failure) ([place])   . Chronic back pain    "down my back, down my legs" (02/18/2017)  . Constipation   . Constipation due to opioid therapy   . GERD (gastroesophageal reflux disease)    04/07/2019- not current  . Hyperlipidemia   . Hypertension    Dr. [name] [phone]  . Ischemic cardiomyopathy    a. LV-gram at time of LHC in 6/17 with EF 35-45%  //  b. Echo 7/17: EF 45-50%, inferior HK, grade 1 diastolic dysfunction, mildly dilated aortic root, moderately reduced RVSF, mild RAE  . Myocardial infarction ([place]) 2017  . 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  Numbness and tingling of both lower extremities    "on the outside of both sides" (02/18/2017)  . Rheumatoid arthritis ([facility])    RA  . Tachycardia   . Type II diabetes mellitus ([place])    diet controlled    Past Surgical History:  Procedure Laterality Date  . BACK SURGERY     x3  . CARDIAC CATHETERIZATION N/A 07/15/2015   Procedure: Left Heart Cath and Coronary Angiography;  Surgeon: [name], MD;  Location: [facility];  Service: Cardiovascular;  Laterality: N/A;  . CARDIAC CATHETERIZATION N/A 07/15/2015   Procedure: Coronary Stent Intervention;  Surgeon: [name], MD;  Location: [facility];  Service: Cardiovascular;  Laterality: N/A;  . CARDIAC CATHETERIZATION N/A 08/07/2015   Procedure: Left Heart Cath and Coronary Angiography;  Surgeon: [name], MD;  Location: [facility];  Service: Cardiovascular;  Laterality: N/A;  . CORONARY ANGIOPLASTY WITH STENT PLACEMENT  07/2015  . LEFT HEART CATH AND CORONARY ANGIOGRAPHY N/A 02/19/2017   Procedure: LEFT HEART CATH AND CORONARY ANGIOGRAPHY;  Surgeon: Martinique, Peter M, MD;  Location: [facility];  Service: Cardiovascular;  Laterality: N/A;  . LUMBAR FUSION  04/11/2019   REVISION OF THORACOLUMBAR FUSION   . LUMBAR SPINE SURGERY  03/2009  &  2012  . MASS EXCISION N/A 04/19/2012   Procedure: removal of posterior cervical lipoma;  Surgeon: [name], MD;  Location: [facility];  Service: Neurosurgery;  Laterality: N/A;  Removal of posterior cervical lipoma  . POPLITEAL SYNOVIAL CYST EXCISION Left    "opened up behind my knee; scraped out arthritis"  . POSTERIOR LUMBAR FUSION 4 LEVEL N/A 11/22/2018   Procedure: Posterior Lateral and Interbody fusion - Lumbar one-Lumbar two; explore fusion, posterior instrumentation and fusion Thoracic ten to the ilium;  Surgeon: Newman Pies, MD;  Location: [facility];  Service: Neurosurgery;  Laterality: N/A;  . SPINAL CORD STIMULATOR INSERTION N/A 01/07/2013    Procedure:  SPINAL CORD STIMULATOR INSERTION;  Surgeon: [name], MD;  Location: [facility];  Service: Neurosurgery;  Laterality: N/A;  . SPINAL CORD STIMULATOR INSERTION N/A 02/09/2019   Procedure: REPLACEMENT OF LUMBAR SPINAL CORD STIMULATOR;  Surgeon: Newman Pies, MD;  Location: [facility];  Service: Neurosurgery;  Laterality: N/A;  Thoracic/Lumbar spine  . TONSILLECTOMY     patient denies  . TOTAL HIP ARTHROPLASTY Right 10/03/2016   Procedure: TOTAL HIP ARTHROPLASTY;  Surgeon: [name], MD;  Location: [employer];  Service: Orthopedics;  Laterality: Right;  . WISDOM TOOTH EXTRACTION     hx of    Current Medications: Current Meds  Medication Sig  . ACCU-CHEK AVIVA PLUS test strip   . acetaminophen (TYLENOL) 500 MG tablet Take 1,000 mg by mouth 2 (two) times daily as needed (pain.).  Marland Kitchen aspirin EC 81 MG tablet Take 1 tablet (81 mg total) by mouth daily.  Marland Kitchen atorvastatin (LIPITOR) 80 MG tablet Take 80 mg by mouth daily at 6 PM.  . carvedilol (COREG) 12.5 MG tablet Take 1 tablet (12.5 mg total) by mouth 2 (two) times daily.  . chlorthalidone (HYGROTON) 25 MG tablet Take 1 tablet (25 mg total) by mouth daily.  . cyclobenzaprine (FLEXERIL) 10 MG tablet Take 1 tablet (10 mg total) by mouth 3 (three) times daily as needed for muscle spasms.  . diclofenac sodium (VOLTAREN) 1 % GEL Apply 1 application topically 4 (four) times daily as needed (pain.).   Marland Kitchen DILT-XR 180 MG 24 hr capsule Take 180 mg by mouth daily.   Marland Kitchen docusate sodium (COLACE) 100 MG capsule Take 1 capsule (100 mg total) by mouth 2 (two) times daily.  . DULoxetine (CYMBALTA) 60 MG capsule Take 1 capsule (60 mg total) by mouth daily.  Marland Kitchen glipiZIDE (GLUCOTROL) 5 MG tablet Take 5 mg by mouth daily before breakfast.   . lisinopril (PRINIVIL,ZESTRIL) 20 MG tablet Take 20 mg by mouth at bedtime.   Marland Kitchen lubiprostone (AMITIZA) 24 MCG capsule Take 48 mcg by mouth at bedtime.   . metFORMIN (GLUCOPHAGE XR) 500 MG 24 hr tablet Take 2 tablets  (1,000 mg total) by mouth 2 (two) times daily.  . Multiple Vitamin (MULTIVITAMIN WITH MINERALS) TABS tablet Take 1 tablet by mouth daily.  Marland Kitchen omeprazole (PRILOSEC) 40 MG capsule Take 1 capsule (40 mg total) by mouth daily.  Marland Kitchen oxyCODONE 20 MG TABS Take 1 tablet (20 mg total) by mouth every 4 (four) hours as needed for moderate pain.  . polyethylene glycol powder (GLYCOLAX/MIRALAX) powder Take 17 g by mouth daily. Mix in 8 oz water, juice, soda, coffee or tea and drink  . potassium chloride SA (KLOR-CON) 20 MEQ tablet Take 1 tablet (20 mEq total) by mouth daily.  . pregabalin (LYRICA) 200 MG capsule Take 1 capsule (200 mg total) by mouth  2 (two) times daily.  . QC LIDOCAINE PAIN RELIEF EX Place 1 patch onto the skin daily as needed (pain.).  Marland Kitchen senna-docusate (SENOKOT S) 8.6-50 MG tablet Take 1 tablet by mouth 2 (two) times daily.     Allergies:   Brilinta [ticagrelor], Methylprednisolone, Prednisone, Toradol [ketorolac tromethamine], and Adhesive [tape]   Social History   Socioeconomic History  . Marital status: Married    Spouse name: Not on file  . Number of children: 5  . Years of education: Not on file  . Highest education level: Not on file  Occupational History  . 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 Smoking status: Former Smoker    Packs/day: 0.25    Years: 40.00    Pack years: 10.00    Types: Cigarettes    Quit date: 2008    Years since quitting: 13.4  . Smokeless tobacco: Never Used  . Tobacco comment: 02/18/2017 "quit in 2012"  Vaping Use  . Vaping Use: Never used  Substance and Sexual Activity  . Alcohol use: No    Alcohol/week: 0.0 standard drinks  . Drug use: No  . Sexual activity: Not Currently  Other Topics Concern  . Not on file  Social History Narrative  . Not on file   Social Determinants of Health   Financial Resource Strain:   . Difficulty of Paying Living Expenses:   Food Insecurity:   . Worried  About Charity fundraiser in the Last Year:   . Arboriculturist in the Last Year:   Transportation Needs:   . Film/video editor (Medical):   Marland Kitchen Lack of Transportation (Non-Medical):   Physical Activity:   . Days of Exercise per Week:   . Minutes of Exercise per Session:   Stress:   . Feeling of Stress :   Social Connections:   . Frequency of Communication with Friends and Family:   . Frequency of Social Gatherings with Friends and Family:   . Attends Religious Services:   . Active Member of Clubs or Organizations:   . Attends Archivist Meetings:   Marland Kitchen Marital Status:      Family History: The patient's family history includes Heart disease in his mother; Prostate cancer in his brother.  ROS:   Please see the history of present illness.    Sleeping relatively well.  Compliant with CPAP.  Still has some chronic back pain but much improved.  Not able to mow his own grass.  Physical activity is increasing.  He is able to go fishing.  Psychologically feels much better.  Wife recently had back surgery so this is because some stress because he has had to help her.  All other systems reviewed and are negative.  EKGs/Labs/Other Studies Reviewed:    The following studies were reviewed today: 2D Doppler echocardiogram August 2020: IMPRESSIONS    1. The left ventricle has mildly reduced systolic function, with an  ejection fraction of 45-50%. The cavity size was normal. There is mildly  increased left ventricular wall thickness. Left ventricular diastolic  Doppler parameters are consistent with  impaired relaxation.  2. Abnormal septal motion inferior basal hypokinesis.  3. The right ventricle has normal systolic function. The cavity was  normal. There is no increase in right ventricular wall thickness.  4. Mild thickening of the mitral valve leaflet. Mild calcification of the  mitral valve leaflet. There is mild mitral annular calcification present.  5. The aortic  valve is tricuspid. Mild thickening of the aortic valve.  Sclerosis without any evidence of stenosis of the aortic valve.  6. The aorta is normal unless otherwise noted.  7. The interatrial septum was not well visualized.    EKG:  EKG normal sinus rhythm, nonspecific T wave abnormality, old inferior infarct unchanged from prior.  Recent Labs: 04/12/2019: BUN 7; Creatinine, Ser 1.02; Hemoglobin 9.9; Platelets 288; Potassium 4.0; Sodium 140  Recent Lipid Panel    Component Value Date/Time   CHOL 111 04/26/2018 1035   TRIG 114 04/26/2018 1035   HDL 31 (L) 04/26/2018 1035   CHOLHDL 3.6 04/26/2018 1035   CHOLHDL 4.3 02/19/2017 0222   VLDL 26 02/19/2017 0222   LDLCALC 57 04/26/2018 1035    Physical Exam:    VS:  BP 122/80   Pulse 87   Ht 5\' 10"  (1.778 m)   Wt 248 lb 1.9 oz (112.5 kg)   SpO2 98%   BMI 35.60 kg/m     Wt Readings from Last 3 Encounters:  08/15/19 248 lb 1.9 oz (112.5 kg)  05/02/19 262 lb (118.8 kg)  04/14/19 265 lb 8.7 oz (120.5 kg)     GEN: Obesity.. No acute distress HEENT: Normal NECK: No JVD. LYMPHATICS: No lymphadenopathy CARDIAC:  RRR without murmur, gallop, or edema. VASCULAR:  Normal Pulses. No bruits. RESPIRATORY:  Clear to auscultation without rales, wheezing or rhonchi  ABDOMEN: Soft, non-tender, non-distended, No pulsatile mass, MUSCULOSKELETAL: No deformity  SKIN: Warm and dry NEUROLOGIC:  Alert and oriented x 3 PSYCHIATRIC:  Normal affect   ASSESSMENT:    1. Coronary artery disease involving native coronary artery of native heart without angina pectoris   2. Chronic combined systolic and diastolic CHF (congestive heart failure) ([place])   3. Essential hypertension   4. Hyperlipidemia with target LDL less than 100   5. Bilateral lower extremity edema   6. Controlled type 2 diabetes mellitus with complication, without long-term current use of insulin ([place])   7. Educated about COVID-19 virus infection    PLAN:    In order of problems  listed above:  1. Secondary prevention reviewed.  As much as possible, increase physical activity to greater than 150 minutes/week.  Other risk factors seem to be well controlled.  Continue antiplatelet therapy in the form of aspirin 81 mg/day. 2. Continue beta-blocker and ACE inhibitor therapy. 3. Blood pressure is excellent.  Continue CPAP.  Continue Zestril, diltiazem carvedilol, and high-grade time. 4. Lipitor 80 mg/day has not yielded LDL cholesterol less than 70 on multiple prior determinations.  Most recent blood work by Dr. Thomasene Lot were " at target" per patient.  I do not have actual copies of that lab.  At target" per patient.  I do not have actual copies of the lab. 5. No significant lower extremity edema is noted on today's exam. 6. Consider adding SGLT2 therapy given slightly reduced LVEF and perhaps getting rid of Glucotrol. 7. Has received COVID-19 vaccine and is practicing social distancing.  Overall education and awareness concerning primary/secondary risk prevention was discussed in detail: LDL less than 70, hemoglobin A1c less than 7, blood pressure target less than 130/80 mmHg, >150 minutes of moderate aerobic activity per week, avoidance of smoking, weight control (via diet and exercise), and continued surveillance/management of/for obstructive sleep apnea.    Medication Adjustments/Labs and Tests Ordered: Current medicines are reviewed at length with the patient today.  Concerns regarding medicines are outlined above.  Orders Placed This  Encounter  Procedures  . EKG 12-Lead   No orders of the defined types were placed in this encounter.   There are no Patient Instructions on file for this visit.   Signed, Sinclair Grooms, MD  08/15/2019 9:36 AM    [place]

## 2019-08-15 ENCOUNTER — Other Ambulatory Visit: Payer: Self-pay

## 2019-08-15 ENCOUNTER — Encounter: Payer: Self-pay | Admitting: Interventional Cardiology

## 2019-08-15 ENCOUNTER — Ambulatory Visit (INDEPENDENT_AMBULATORY_CARE_PROVIDER_SITE_OTHER): Payer: Medicare Other | Admitting: Interventional Cardiology

## 2019-08-15 VITALS — BP 122/80 | HR 87 | Ht 70.0 in | Wt 248.1 lb

## 2019-08-15 DIAGNOSIS — I5042 Chronic combined systolic (congestive) and diastolic (congestive) heart failure: Secondary | ICD-10-CM

## 2019-08-15 DIAGNOSIS — Z7189 Other specified counseling: Secondary | ICD-10-CM

## 2019-08-15 DIAGNOSIS — I2510 Atherosclerotic heart disease of native coronary artery without angina pectoris: Secondary | ICD-10-CM

## 2019-08-15 DIAGNOSIS — E785 Hyperlipidemia, unspecified: Secondary | ICD-10-CM

## 2019-08-15 DIAGNOSIS — R600 Localized edema: Secondary | ICD-10-CM

## 2019-08-15 DIAGNOSIS — I10 Essential (primary) hypertension: Secondary | ICD-10-CM

## 2019-08-15 DIAGNOSIS — E118 Type 2 diabetes mellitus with unspecified complications: Secondary | ICD-10-CM

## 2019-08-15 NOTE — Patient Instructions (Signed)

## 2019-08-17 ENCOUNTER — Ambulatory Visit: Payer: Medicare Other | Admitting: Physical Therapy

## 2019-08-19 ENCOUNTER — Ambulatory Visit: Payer: Medicare Other | Admitting: Physical Therapy

## 2019-10-31 ENCOUNTER — Other Ambulatory Visit: Payer: Self-pay | Admitting: Cardiology

## 2019-11-02 ENCOUNTER — Other Ambulatory Visit: Payer: Self-pay

## 2019-11-25 ENCOUNTER — Telehealth: Payer: Self-pay | Admitting: Interventional Cardiology

## 2019-11-25 NOTE — Telephone Encounter (Signed)
    [name] calling she said she have question about pt's medications

## 2019-11-25 NOTE — Telephone Encounter (Signed)
Wife states pt went to new PCP today and requested refills on Sildenafil.  They told him they would not fill without permission from Cardiologist.  Spoke with [name] and he said ok for pt to use Sildenafil.  Spoke with wife and made her aware.  She asked that we fax something or call the office to let them know.  [facility] on [address] and operator will send message to Fredrich Romans, PA-C with information.

## 2019-11-28 ENCOUNTER — Other Ambulatory Visit: Payer: Self-pay | Admitting: Interventional Cardiology

## 2019-11-30 MED ORDER — CARVEDILOL 12.5 MG PO TABS: 12.5000 mg | ORAL_TABLET | Freq: Two times a day (BID) | ORAL | 2 refills | Status: DC

## 2019-12-22 ENCOUNTER — Other Ambulatory Visit (HOSPITAL_COMMUNITY): Payer: Self-pay | Admitting: Family Medicine

## 2019-12-23 ENCOUNTER — Other Ambulatory Visit (HOSPITAL_COMMUNITY): Payer: Self-pay | Admitting: Family Medicine

## 2019-12-26 ENCOUNTER — Other Ambulatory Visit (HOSPITAL_COMMUNITY): Payer: Self-pay | Admitting: Family Medicine

## 2020-01-20 ENCOUNTER — Other Ambulatory Visit (HOSPITAL_COMMUNITY): Payer: Self-pay | Admitting: Family Medicine

## 2020-02-15 ENCOUNTER — Other Ambulatory Visit (HOSPITAL_COMMUNITY): Payer: Self-pay | Admitting: Family Medicine

## 2020-03-07 ENCOUNTER — Other Ambulatory Visit: Payer: Self-pay

## 2020-03-07 MED ORDER — POTASSIUM CHLORIDE CRYS ER 20 MEQ PO TBCR: 20.0000 meq | EXTENDED_RELEASE_TABLET | Freq: Every day | ORAL | 1 refills | Status: DC

## 2020-03-15 ENCOUNTER — Other Ambulatory Visit (HOSPITAL_COMMUNITY): Payer: Self-pay | Admitting: Family Medicine

## 2020-03-20 ENCOUNTER — Other Ambulatory Visit (HOSPITAL_COMMUNITY): Payer: Self-pay | Admitting: Physician Assistant

## 2020-04-12 ENCOUNTER — Other Ambulatory Visit (HOSPITAL_COMMUNITY): Payer: Self-pay | Admitting: Family Medicine

## 2020-05-10 ENCOUNTER — Other Ambulatory Visit (HOSPITAL_COMMUNITY): Payer: Self-pay | Admitting: Family Medicine

## 2020-06-07 ENCOUNTER — Other Ambulatory Visit (HOSPITAL_COMMUNITY): Payer: Self-pay

## 2020-06-07 MED ORDER — OXYCODONE HCL 20 MG PO TABS
ORAL_TABLET | ORAL | 0 refills | Status: DC
  Filled 2020-06-07: qty 120, 30d supply, fill #0

## 2020-06-07 MED ORDER — PREGABALIN 150 MG PO CAPS
ORAL_CAPSULE | ORAL | 0 refills | Status: DC
  Filled 2020-06-07: qty 60, 30d supply, fill #0

## 2020-06-07 MED ORDER — BUPRENORPHINE HCL 8 MG SL SUBL
SUBLINGUAL_TABLET | SUBLINGUAL | 0 refills | Status: DC
  Filled 2020-06-07: qty 30, 30d supply, fill #0

## 2020-06-08 ENCOUNTER — Telehealth: Payer: Self-pay | Admitting: Interventional Cardiology

## 2020-06-08 ENCOUNTER — Other Ambulatory Visit (HOSPITAL_COMMUNITY): Payer: Self-pay

## 2020-06-08 ENCOUNTER — Other Ambulatory Visit: Payer: Self-pay | Admitting: Interventional Cardiology

## 2020-06-08 NOTE — Telephone Encounter (Signed)
Pt's medication has already been sent to pt's pharmacy as requested. Confirmation received.  

## 2020-06-08 NOTE — Telephone Encounter (Signed)
*  STAT* If patient is at the pharmacy, call can be transferred to refill team.   1. Which medications need to be refilled? (please list name of each medication and dose if known) carvedilol (COREG) 12.5 MG tablet  2. Which pharmacy/location (including street and city if local pharmacy) is medication to be sent to? [place], [place]  3. Do they need a 30 day or 90 day supply? [place]

## 2020-07-04 ENCOUNTER — Other Ambulatory Visit (HOSPITAL_COMMUNITY): Payer: Self-pay

## 2020-07-05 ENCOUNTER — Other Ambulatory Visit (HOSPITAL_COMMUNITY): Payer: Self-pay

## 2020-07-05 MED ORDER — PREGABALIN 150 MG PO CAPS
ORAL_CAPSULE | ORAL | 0 refills | Status: DC
  Filled 2020-07-05: qty 60, 30d supply, fill #0

## 2020-07-05 MED ORDER — BELBUCA 900 MCG BU FILM
ORAL_FILM | BUCCAL | 0 refills | Status: DC
  Filled 2020-07-05: qty 60, 30d supply, fill #0

## 2020-07-05 MED ORDER — OXYCODONE HCL 20 MG PO TABS
ORAL_TABLET | ORAL | 0 refills | Status: DC
  Filled 2020-07-05: qty 120, 30d supply, fill #0

## 2020-07-06 ENCOUNTER — Other Ambulatory Visit (HOSPITAL_COMMUNITY): Payer: Self-pay

## 2020-08-06 ENCOUNTER — Other Ambulatory Visit (HOSPITAL_COMMUNITY): Payer: Self-pay

## 2020-08-06 MED ORDER — BELBUCA 900 MCG BU FILM
ORAL_FILM | BUCCAL | 0 refills | Status: DC
  Filled 2020-08-07: qty 60, 30d supply, fill #0

## 2020-08-06 MED ORDER — PREGABALIN 150 MG PO CAPS
ORAL_CAPSULE | ORAL | 0 refills | Status: DC
  Filled 2020-08-07: qty 60, 30d supply, fill #0

## 2020-08-06 MED ORDER — OXYCODONE HCL 20 MG PO TABS
ORAL_TABLET | ORAL | 0 refills | Status: DC
  Filled 2020-08-07: qty 120, 30d supply, fill #0

## 2020-08-07 ENCOUNTER — Other Ambulatory Visit (HOSPITAL_COMMUNITY): Payer: Self-pay

## 2020-08-17 ENCOUNTER — Other Ambulatory Visit (HOSPITAL_COMMUNITY): Payer: Self-pay

## 2020-08-21 ENCOUNTER — Other Ambulatory Visit (HOSPITAL_COMMUNITY): Payer: Self-pay

## 2020-08-21 MED ORDER — DICLOFENAC SODIUM 1 % EX GEL
CUTANEOUS | 5 refills | Status: DC
  Filled 2020-08-21: qty 100, 30d supply, fill #0
  Filled 2020-11-01: qty 100, 30d supply, fill #1

## 2020-09-03 ENCOUNTER — Other Ambulatory Visit (HOSPITAL_COMMUNITY): Payer: Self-pay

## 2020-09-04 ENCOUNTER — Other Ambulatory Visit (HOSPITAL_COMMUNITY): Payer: Self-pay

## 2020-09-04 MED ORDER — BELBUCA 900 MCG BU FILM
ORAL_FILM | BUCCAL | 0 refills | Status: DC
  Filled 2020-09-04: qty 60, 30d supply, fill #0

## 2020-09-04 MED ORDER — PREGABALIN 150 MG PO CAPS
ORAL_CAPSULE | ORAL | 0 refills | Status: DC
  Filled ????-??-??: fill #0

## 2020-09-04 MED ORDER — OXYCODONE HCL 20 MG PO TABS
ORAL_TABLET | ORAL | 0 refills | Status: DC
  Filled ????-??-??: fill #0

## 2020-09-04 MED ORDER — OXYCODONE HCL 20 MG PO TABS
ORAL_TABLET | ORAL | 0 refills | Status: DC
  Filled 2020-09-04: qty 116, 29d supply, fill #0

## 2020-09-04 MED ORDER — PREGABALIN 150 MG PO CAPS
ORAL_CAPSULE | ORAL | 0 refills | Status: DC
  Filled 2020-09-04: qty 56, 28d supply, fill #0

## 2020-09-04 MED ORDER — BELBUCA 900 MCG BU FILM
ORAL_FILM | BUCCAL | 0 refills | Status: DC
  Filled ????-??-??: fill #0

## 2020-09-13 ENCOUNTER — Other Ambulatory Visit (HOSPITAL_COMMUNITY): Payer: Self-pay

## 2020-09-14 ENCOUNTER — Other Ambulatory Visit (HOSPITAL_COMMUNITY): Payer: Self-pay

## 2020-09-14 MED ORDER — CELECOXIB 400 MG PO CAPS
ORAL_CAPSULE | ORAL | 1 refills | Status: DC
  Filled 2020-09-14 – 2020-10-05 (×2): qty 90, 90d supply, fill #0

## 2020-09-17 ENCOUNTER — Other Ambulatory Visit (HOSPITAL_COMMUNITY): Payer: Self-pay

## 2020-09-18 ENCOUNTER — Telehealth: Payer: Self-pay

## 2020-09-18 NOTE — Telephone Encounter (Signed)
Patient has follow up appointment scheduled with Dr [name] on 10/01/20. I advised patient that Dr [name] could address his pre-op clearance at that time. The patient voiced understanding.

## 2020-09-18 NOTE — Telephone Encounter (Signed)
Primary Cardiologist:Henry Nicholes Stairs III, MD  Chart reviewed as part of pre-operative protocol coverage. Because of Thomas Mcclure's past medical history and time since last visit, he/she will require a follow-up visit in order to better assess preoperative cardiovascular risk.  Pre-op covering staff: - Please schedule appointment and call patient to inform them. - Please contact requesting surgeon's office via preferred method (i.e, phone, fax) to inform them of need for appointment prior to surgery.  If applicable, this message will also be routed to pharmacy pool and/or primary cardiologist for input on holding anticoagulant/antiplatelet agent as requested below so that this information is available at time of patient's appointment.   Deberah Pelton, NP  09/18/2020, 3:07 PM

## 2020-09-18 NOTE — Telephone Encounter (Signed)
   [facility] Pre-operative Risk Assessment    Patient Name: Thomas Mcclure  DOB: 1955-04-21 MRN: [id number]  HEARTCARE STAFF:  - IMPORTANT!!!!!! Under Visit Info/Reason for Call, type in Other and utilize the format Clearance MM/DD/YY or Clearance TBD. Do not use dashes or single digits. - Please review there is not already an duplicate clearance open for this procedure. - If request is for dental extraction, please clarify the # of teeth to be extracted. - If the patient is currently at the dentist's office, call Pre-Op Callback Staff (MA/nurse) to input urgent request.  - If the patient is not currently in the dentist office, please route to the Pre-Op pool.  Request for surgical clearance:  What type of surgery is being performed? Right Total Knee Replacement  When is this surgery scheduled? TBD  What type of clearance is required (medical clearance vs. Pharmacy clearance to hold med vs. Both)? both  Are there any medications that need to be held prior to surgery and how long? Aspirin 21m   Practice name and name of physician performing surgery? Thomas Mcclure  Thomas Mcclure What is the office phone number? [phone] ([name])   7.   What is the office fax number? [phone] 8.   Anesthesia type (None, local, MAC, general) ? None listed   Thomas Mcclure 09/18/2020, 2:49 PM  _________________________________________________________________   (provider comments below)

## 2020-09-26 ENCOUNTER — Other Ambulatory Visit (HOSPITAL_COMMUNITY): Payer: Self-pay

## 2020-09-28 NOTE — Progress Notes (Signed)
Cardiology Office Note:    Date:  10/01/2020   ID:  [name], DOB 1955-07-08, MRN [id number]  PCP:  Fredrich Romans, PA  Cardiologist:  Sinclair Grooms, MD   Referring MD: Vassie Moment, MD   Chief Complaint  Patient presents with   Coronary Artery Disease     History of Present Illness:    Thomas Mcclure is a 65 y.o. male with a hx of  NSTEMI in 2017 status post DES to the RCA. 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 Cath in 2018 demonstrated patent stent.  He is having right knee replacement surgery by Dr. French Ana.  He is here for clearance.  He is not having angina or any signs or symptoms of heart failure.  Most recent imaging revealed widely patent coronary arteries in 2018 and a stress nuclear study in 2020 that did not reveal any evidence of ischemia.  Not requiring nitroglycerin use.  Past Medical History:  Diagnosis Date   Baker's cyst    Left calf   CAD in native artery, 07/15/15 PCI of RCA with DES 07/16/2015   a.   NSTEMI 5/17: LHC - pLAD 20, pLCx 20, OM1 30, pRCA 100, EF 45-50%>> PCI: 2.5 x 24 mm Promus DES to RCA  //  b.   Echo 5/17: mild LVH, EF 50-55%, no RWMA, mod RVE //  c. LHC 6/17: pLAD 20, pLCx 20, OM1 50, pRCA stent ok, EF 35-45% with mod sized inf wall and basal segment aneurysm   CHF (congestive heart failure) (HCC)    Chronic back pain    "down my back, down my legs" (02/18/2017)   Constipation    Constipation due to opioid therapy    GERD (gastroesophageal reflux disease)    04/07/2019- not current   Hyperlipidemia    Hypertension    Dr. [name] [phone]   Ischemic cardiomyopathy    a. LV-gram at time of LHC in 6/17 with EF 35-45%  //  b. 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"   Numbness and tingling of both lower extremities    "on the outside of both sides" (02/18/2017)   Rheumatoid arthritis (HCC)    RA   Tachycardia    Type II diabetes mellitus ([place])    diet controlled    Past Surgical History:  Procedure Laterality Date   BACK SURGERY     x3   CARDIAC CATHETERIZATION N/A 07/15/2015   Procedure: Left Heart Cath and Coronary Angiography;  Surgeon: [name], MD;  Location: [facility];  Service: Cardiovascular;  Laterality: N/A;   CARDIAC CATHETERIZATION N/A 07/15/2015   Procedure: Coronary Stent Intervention;  Surgeon: [name], MD;  Location: [facility];  Service: Cardiovascular;  Laterality: N/A;   CARDIAC CATHETERIZATION N/A 08/07/2015   Procedure: Left Heart Cath and Coronary Angiography;  Surgeon: [name], MD;  Location: [facility];  Service: Cardiovascular;  Laterality: N/A;   CORONARY ANGIOPLASTY WITH STENT PLACEMENT  07/2015   LEFT HEART CATH AND CORONARY ANGIOGRAPHY N/A 02/19/2017   Procedure: LEFT HEART CATH AND CORONARY ANGIOGRAPHY;  Surgeon: Martinique, Peter M, MD;  Location: [facility];  Service: Cardiovascular;  Laterality: N/A;   LUMBAR FUSION  04/11/2019   REVISION OF THORACOLUMBAR FUSION    LUMBAR SPINE SURGERY  03/2009  & 2012   MASS EXCISION N/A 04/19/2012   Procedure: removal of posterior cervical lipoma;  Surgeon: [name], MD;  Location: [facility];  Service: Neurosurgery;  Laterality: N/A;  Removal of posterior cervical lipoma   POPLITEAL SYNOVIAL CYST EXCISION Left    "opened up behind my knee; scraped out arthritis"   POSTERIOR LUMBAR FUSION 4 LEVEL N/A 11/22/2018   Procedure: Posterior Lateral and Interbody fusion - Lumbar one-Lumbar two; explore fusion, posterior instrumentation and fusion Thoracic ten to the ilium;  Surgeon: Newman Pies, MD;  Location: [facility];  Service:  Neurosurgery;  Laterality: N/A;   SPINAL CORD STIMULATOR INSERTION N/A 01/07/2013   Procedure:  SPINAL CORD STIMULATOR INSERTION;  Surgeon: [name], MD;  Location: [facility];  Service: Neurosurgery;  Laterality: N/A;   SPINAL CORD STIMULATOR INSERTION N/A 02/09/2019   Procedure: REPLACEMENT OF LUMBAR SPINAL CORD STIMULATOR;  Surgeon: Newman Pies, MD;  Location: [facility];  Service: Neurosurgery;  Laterality: N/A;  Thoracic/Lumbar spine   TONSILLECTOMY     patient denies   TOTAL HIP ARTHROPLASTY Right 10/03/2016   Procedure: TOTAL HIP ARTHROPLASTY;  Surgeon: [name], MD;  Location: [facility];  Service: Orthopedics;  Laterality: Right;   WISDOM TOOTH EXTRACTION     hx of    Current Medications: Current Meds  Medication Sig   ACCU-CHEK AVIVA PLUS test strip    Accu-Chek Softclix Lancets lancets 4 (four) times daily.   acetaminophen (TYLENOL) 500 MG tablet Take 1,000 mg by mouth 2 (two) times daily as needed (pain.).   aspirin EC 81 MG tablet Take 1 tablet (81 mg total) by mouth daily.   atorvastatin (LIPITOR) 80 MG tablet Take 80 mg by mouth daily at 6 PM.   buprenorphine (SUBUTEX) 8 MG SUBL SL tablet Place 1/2 Tablet under the tongue two times daily   Buprenorphine HCl (BELBUCA) 900 MCG FILM Place 1 film inside cheeks 2 times daily   carvedilol (COREG) 12.5 MG tablet TAKE 1 TABLET(12.5 MG) BY MOUTH TWICE DAILY   celecoxib (CELEBREX) 400 MG capsule Take 1 capsule by mouth once a day at the start of a meal   chlorthalidone (HYGROTON) 25 MG tablet TAKE 1 TABLET BY MOUTH DAILY   cyclobenzaprine (FLEXERIL) 10 MG tablet Take 1 tablet (10 mg total) by mouth 3 (three) times daily as needed for muscle spasms.   diclofenac Sodium (VOLTAREN) 1 % GEL Apply a small amount topically to affected area 2 times a day as needed.   Diclofenac Sodium-Menthol (DITHOL) 1.5 & 10 % THPK Apply topically.   DILT-XR 180 MG 24 hr capsule Take 180 mg by mouth daily.    docusate sodium (COLACE) 100 MG  capsule Take 1 capsule (100 mg total) by mouth 2 (two) times daily.   DULoxetine (CYMBALTA) 60 MG capsule Take 1 capsule (60 mg total) by mouth daily.   glipiZIDE (GLUCOTROL) 10 MG tablet TAKE 1 TABLET BY MOUTH DAILY   glipiZIDE (GLUCOTROL) 5 MG tablet Take 5 mg by mouth daily before breakfast.    lisinopril (PRINIVIL,ZESTRIL) 20 MG tablet Take 20 mg by mouth at bedtime.    lubiprostone (AMITIZA) 24 MCG capsule Take 48 mcg by mouth at bedtime.    metFORMIN (GLUCOPHAGE XR) 500 MG 24 hr tablet Take 2 tablets (1,000 mg total) by mouth 2 (  two) times daily.   Multiple Vitamin (MULTIVITAMIN WITH MINERALS) TABS tablet Take 1 tablet by mouth daily.   omeprazole (PRILOSEC) 40 MG capsule Take 1 capsule (40 mg total) by mouth daily.   Oxycodone HCl 20 MG TABS Take 1 Tablet by mouth every 6 hours, as needed   polyethylene glycol powder (GLYCOLAX/MIRALAX) powder Take 17 g by mouth daily. Mix in 8 oz water, juice, soda, coffee or tea and drink   potassium chloride SA (KLOR-CON) 20 MEQ tablet Take 1 tablet (20 mEq total) by mouth daily.   pregabalin (LYRICA) 150 MG capsule Take 1 Capsule by mouth 2 times daily   QC LIDOCAINE PAIN RELIEF EX Place 1 patch onto the skin daily as needed (pain.).   senna-docusate (SENOKOT S) 8.6-50 MG tablet Take 1 tablet by mouth 2 (two) times daily.   sildenafil (VIAGRA) 100 MG tablet Take 100 mg by mouth as needed.     Allergies:   Brilinta [ticagrelor], Methylprednisolone, Prednisone, Toradol [ketorolac tromethamine], and Adhesive [tape]   Social History   Socioeconomic History   Marital status: Married    Spouse name: Not on file   Number of children: 5   Years of education: Not on file   Highest education level: Not on file  Occupational History   Occupation: Development worker, community, now on disability due to back pain  Tobacco Use   Smoking status: Former    Packs/day: 0.25    Years: 40.00    Pack years: 10.00    Types: Cigarettes    Quit date: 2008    Years  since quitting: 14.6   Smokeless tobacco: Never   Tobacco comments:    02/18/2017 "quit in 2012"  Vaping Use   Vaping Use: Never used  Substance and Sexual Activity   Alcohol use: No    Alcohol/week: 0.0 standard drinks   Drug use: No   Sexual activity: Not Currently  Other Topics Concern   Not on file  Social History Narrative   Not on file   Social Determinants of Health   Financial Resource Strain: Not on file  Food Insecurity: Not on file  Transportation Needs: Not on file  Physical Activity: Not on file  Stress: Not on file  Social Connections: Not on file     Family History: The patient's family history includes Heart disease in his mother; Prostate cancer in his brother.  ROS:   Please see the history of present illness.    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  Has chronic lower right back pain.  Unable to exercise very much due to both back and knee.  No nitroglycerin use.  No episodes of chest discomfort.  Denies orthopnea and PND.  All other systems reviewed and are negative.  EKGs/Labs/Other Studies Reviewed:    The following studies were reviewed today: Cardiac cath 2018:  Diagnostic Dominance: Right  Diagnostic Dominance: Right  Proximal RCA stent has no stenosis  Nuclear stress test October 2020: Study Highlights  Nuclear stress EF: 42%. The left ventricular ejection fraction is moderately decreased (30-44%). There was no ST segment deviation noted during stress. Defect 1: There is a small defect of severe severity present in the basal inferior and mid inferior location. Findings consistent with prior Inferior wall myocardial infarction. This is an intermediate risk study based on reduction of EF . There is no evidence of ischemia.  EKG:  EKG normal sinus rhythm with inferior Q waves.  No changes noted when compared to June 2021.  Recent Labs: No results found for requested labs within last 8760 hours.  Recent Lipid Panel    Component Value  Date/Time   CHOL 111 04/26/2018 1035   TRIG 114 04/26/2018 1035   HDL 31 (L) 04/26/2018 1035   CHOLHDL 3.6 04/26/2018 1035   CHOLHDL 4.3 02/19/2017 0222   VLDL 26 02/19/2017 0222   LDLCALC 57 04/26/2018 1035    Physical Exam:    VS:  BP 122/72   Pulse 94   Ht '5\' 10"'$  (1.778 m)   Wt 254 lb (115.2 kg)   SpO2 96%   BMI 36.45 kg/m     Wt Readings from Last 3 Encounters:  10/01/20 254 lb (115.2 kg)  08/15/19 248 lb 1.9 oz (112.5 kg)  05/02/19 262 lb (118.8 kg)     GEN: Abdominal obesity. No acute distress HEENT: Normal NECK: No JVD. LYMPHATICS: No lymphadenopathy CARDIAC: No murmur. RRR no gallop, or edema. VASCULAR:  Normal Pulses. No bruits. RESPIRATORY:  Clear to auscultation without rales, wheezing or rhonchi  ABDOMEN: Soft, non-tender, non-distended, No pulsatile mass, MUSCULOSKELETAL: No deformity  SKIN: Warm and dry NEUROLOGIC:  Alert and oriented x 3 PSYCHIATRIC:  Normal affect   ASSESSMENT:    1. Coronary artery disease involving native coronary artery of native heart without angina pectoris   2. Preoperative clearance   3. Chronic combined systolic and diastolic CHF (congestive heart failure) ([place])   4. Essential hypertension   5. Hyperlipidemia with target LDL less than 100   6. Controlled type 2 diabetes mellitus with complication, without long-term current use of insulin (HCC)    PLAN:    In order of problems listed above:  Clinically stable from cardiac standpoint.  Essentially widely patent coronaries 2018 and nonischemic nuclear study 2020.  Has been on risk prevention since that time.  Not having angina.  Secondary prevention discussed. Upcoming surgery should go well.  He is cleared from cardiac standpoint without further testing required.  Will make sure that Dr. French Ana knows we have cleared him for right knee surgery. No clinical signs or symptoms of heart failure.  EF is at least 50%. Blood pressures well controlled. Continue lipid therapy to  keep LDL less than 70.  No side effects. Target hemoglobin A1c less than 7.   Overall education and awareness concerning primary/secondary risk prevention was discussed in detail: LDL less than 70, hemoglobin A1c less than 7, blood pressure target less than 130/80 mmHg, >150 minutes of moderate aerobic activity per week, avoidance of smoking, weight control (via diet and exercise), and continued surveillance/management of/for obstructive sleep apnea.  Clinical follow-up in 1 year unless symptoms.   Medication Adjustments/Labs and Tests Ordered: Current medicines are reviewed at length with the patient today.  Concerns regarding medicines are outlined above.  Orders Placed This Encounter  Procedures   EKG 12-Lead   No orders of the defined types were placed in this encounter.   Patient Instructions  Medication Instructions:  Your physician recommends that you continue on your current medications as directed. Please refer to the Current Medication list given to you today.  *If you need a refill on your cardiac medications before your next appointment, please call your pharmacy*   Lab Work: None If you have labs (blood work) drawn today and your tests are completely normal, you will receive your results only by: [place] (if you have MyChart) OR A paper copy in the mail If you have any lab test that is abnormal or we  need to change your treatment, we will call you to review the results.   Testing/Procedures: None   Follow-Up: At [facility], you and your health needs are our priority.  As part of our continuing mission to provide you with exceptional heart care, we have created designated Provider Care Teams.  These Care Teams include your primary Cardiologist (physician) and Advanced Practice Providers (APPs -  Physician Assistants and Nurse Practitioners) who all work together to provide you with the care you need, when you need it.  We recommend signing up for the  patient portal called "MyChart".  Sign up information is provided on this After Visit Summary.  MyChart is used to connect with patients for Virtual Visits (Telemedicine).  Patients are able to view lab/test results, encounter notes, upcoming appointments, etc.  Non-urgent messages can be sent to your provider as well.   To learn more about what you can do with MyChart, go to [email or web address].    Your next appointment:   1 year(s)  The format for your next appointment:   In Person  Provider:   You may see Sinclair Grooms, MD or one of the following Advanced Practice Providers on your designated Care Team:   Cecilie Kicks, NP   Other Instructions     Signed, Sinclair Grooms, MD  10/01/2020 10:06 AM    [place]

## 2020-10-01 ENCOUNTER — Encounter: Payer: Self-pay | Admitting: Interventional Cardiology

## 2020-10-01 ENCOUNTER — Ambulatory Visit (INDEPENDENT_AMBULATORY_CARE_PROVIDER_SITE_OTHER): Payer: Medicare Other | Admitting: Interventional Cardiology

## 2020-10-01 ENCOUNTER — Other Ambulatory Visit: Payer: Self-pay

## 2020-10-01 VITALS — BP 122/72 | HR 94 | Ht 70.0 in | Wt 254.0 lb

## 2020-10-01 DIAGNOSIS — Z01810 Encounter for preprocedural cardiovascular examination: Secondary | ICD-10-CM | POA: Diagnosis not present

## 2020-10-01 DIAGNOSIS — I10 Essential (primary) hypertension: Secondary | ICD-10-CM | POA: Diagnosis not present

## 2020-10-01 DIAGNOSIS — I2510 Atherosclerotic heart disease of native coronary artery without angina pectoris: Secondary | ICD-10-CM

## 2020-10-01 DIAGNOSIS — E118 Type 2 diabetes mellitus with unspecified complications: Secondary | ICD-10-CM

## 2020-10-01 DIAGNOSIS — I5042 Chronic combined systolic (congestive) and diastolic (congestive) heart failure: Secondary | ICD-10-CM

## 2020-10-01 DIAGNOSIS — E785 Hyperlipidemia, unspecified: Secondary | ICD-10-CM

## 2020-10-01 DIAGNOSIS — Z01818 Encounter for other preprocedural examination: Secondary | ICD-10-CM

## 2020-10-01 NOTE — Patient Instructions (Signed)
Medication Instructions:  Your physician recommends that you continue on your current medications as directed. Please refer to the Current Medication list given to you today.  *If you need a refill on your cardiac medications before your next appointment, please call your pharmacy*   Lab Work: None If you have labs (blood work) drawn today and your tests are completely normal, you will receive your results only by: MyChart Message (if you have MyChart) OR A paper copy in the mail If you have any lab test that is abnormal or we need to change your treatment, we will call you to review the results.   Testing/Procedures: None   Follow-Up: At [facility], you and your health needs are our priority.  As part of our continuing mission to provide you with exceptional heart care, we have created designated Provider Care Teams.  These Care Teams include your primary Cardiologist (physician) and Advanced Practice Providers (APPs -  Physician Assistants and Nurse Practitioners) who all work together to provide you with the care you need, when you need it.  We recommend signing up for the patient portal called "MyChart".  Sign up information is provided on this After Visit Summary.  MyChart is used to connect with patients for Virtual Visits (Telemedicine).  Patients are able to view lab/test results, encounter notes, upcoming appointments, etc.  Non-urgent messages can be sent to your provider as well.   To learn more about what you can do with MyChart, go to [email or web address].    Your next appointment:   1 year(s)  The format for your next appointment:   In Person  Provider:   You may see Henry W Smith III, MD or one of the following Advanced Practice Providers on your designated Care Team:   Laura Ingold, NP   Other Instructions   

## 2020-10-05 ENCOUNTER — Other Ambulatory Visit (HOSPITAL_COMMUNITY): Payer: Self-pay

## 2020-10-05 MED ORDER — BELBUCA 900 MCG BU FILM
ORAL_FILM | BUCCAL | 0 refills | Status: DC
  Filled 2020-10-05: qty 60, 30d supply, fill #0

## 2020-10-05 MED ORDER — OXYCODONE HCL 20 MG PO TABS
ORAL_TABLET | ORAL | 0 refills | Status: DC
  Filled 2020-10-05: qty 120, 30d supply, fill #0

## 2020-10-05 MED ORDER — PREGABALIN 150 MG PO CAPS
150.0000 mg | ORAL_CAPSULE | Freq: Two times a day (BID) | ORAL | 0 refills | Status: DC
  Filled 2020-10-05: qty 60, 30d supply, fill #0

## 2020-10-05 MED ORDER — OXYCODONE HCL 20 MG PO TABS: ORAL_TABLET | ORAL | 0 refills | Status: DC

## 2020-10-06 ENCOUNTER — Other Ambulatory Visit (HOSPITAL_COMMUNITY): Payer: Self-pay

## 2020-10-08 ENCOUNTER — Other Ambulatory Visit (HOSPITAL_COMMUNITY): Payer: Self-pay

## 2020-10-11 ENCOUNTER — Telehealth: Payer: Self-pay | Admitting: Interventional Cardiology

## 2020-10-11 NOTE — Telephone Encounter (Signed)
   [facility] Pre-operative Risk Assessment    Patient Name: Thomas Mcclure  DOB: May 09, 1955 MRN: [id number]  HEARTCARE STAFF:  - IMPORTANT!!!!!! Under Visit Info/Reason for Call, type in Other and utilize the format Clearance MM/DD/YY or Clearance TBD. Do not use dashes or single digits. - Please review there is not already an duplicate clearance open for this procedure. - If request is for dental extraction, please clarify the # of teeth to be extracted. - If the patient is currently at the dentist's office, call Pre-Op Callback Staff (MA/nurse) to input urgent request.  - If the patient is not currently in the dentist office, please route to the Pre-Op pool.  Request for surgical clearance:  What type of surgery is being performed? Right Total Knee Replacement  When is this surgery scheduled? Pending  What type of clearance is required (medical clearance vs. Pharmacy clearance to hold med vs. Both)? Medical  Are there any medications that need to be held prior to surgery and how long?   Practice name and name of physician performing surgery?  Dr [name]  What is the office phone number? [phone] x [phone]   7.   What is the office fax number? [phone]  8.   Anesthesia type (None, local, MAC, general) ? Not sure   Glyn Ade 10/11/2020, 8:22 AM  _________________________________________________________________   (provider comments below)

## 2020-10-18 NOTE — Progress Notes (Signed)
Sent message, via epic in basket, requesting orders in epic from surgeon.  

## 2020-10-19 ENCOUNTER — Other Ambulatory Visit: Payer: Self-pay | Admitting: Physician Assistant

## 2020-10-25 ENCOUNTER — Ambulatory Visit: Payer: Self-pay | Admitting: Physician Assistant

## 2020-10-25 NOTE — H&P (View-Only) (Signed)
TOTAL KNEE ADMISSION H&P  Patient is being admitted for right total knee arthroplasty.  Subjective:  Chief Complaint:right knee pain.  HPI: Thomas Mcclure, 65 y.o. male, has a history of pain and functional disability in the right knee due to arthritis and has failed non-surgical conservative treatments for greater than 12 weeks to includeNSAID's and/or analgesics, corticosteriod injections, viscosupplementation injections, flexibility and strengthening excercises, use of assistive devices, and activity modification.  Onset of symptoms was gradual, starting >10 years ago with gradually worsening course since that time. The patient noted prior procedures on the knee to include  arthroscopy and menisectomy on the right knee(s).  Patient currently rates pain in the right knee(s) at 10 out of 10 with activity. Patient has night pain, worsening of pain with activity and weight bearing, pain that interferes with activities of daily living, pain with passive range of motion, crepitus, and joint swelling.  Patient has evidence of periarticular osteophytes and joint space narrowing by imaging studies.There is no active infection.  Patient Active Problem List   Diagnosis Date Noted   Chronic low back pain 02/09/2019   Spinal stenosis of lumbar region with neurogenic claudication 11/22/2018   Radiculopathy of lumbar region 05/31/2017   HCAP (healthcare-associated pneumonia) 05/30/2017   Leukocytosis 05/30/2017   Lumbar pseudoarthrosis 05/27/2017   Unstable angina ([place]) 02/18/2017   Primary localized osteoarthritis of right hip 10/03/2016   Chest pain, rule out acute myocardial infarction 05/01/2016   Diabetes mellitus type 2, controlled, with complications ([place]) [id number]   Obesity (BMI 30-39.9) 05/01/2016   Left ventricular aneurysm 12/03/2015   Ischemic cardiomyopathy    CAD (coronary artery disease) 07/16/2015   Hypokalemia 07/16/2015   History of non-ST elevation myocardial infarction (NSTEMI)  07/15/2015   Hypertension 07/15/2015   Hyperlipidemia with target LDL less than 100 07/15/2015   Back pain 07/15/2015   GERD (gastroesophageal reflux disease) 06/18/2012   Chronic radicular low back pain 06/18/2012   Past Medical History:  Diagnosis Date   Baker's cyst    Left calf   CAD in native artery, 07/15/15 PCI of RCA with DES 07/16/2015   a.   NSTEMI 5/17: LHC - pLAD 20, pLCx 20, OM1 30, pRCA 100, EF 45-50%>> PCI: 2.5 x 24 mm Promus DES to RCA  //  b.   Echo 5/17: mild LVH, EF 50-55%, no RWMA, mod RVE //  c. LHC 6/17: pLAD 20, pLCx 20, OM1 50, pRCA stent ok, EF 35-45% with mod sized inf wall and basal segment aneurysm   CHF (congestive heart failure) (HCC)    Chronic back pain    "down my back, down my legs" (02/18/2017)   Constipation    Constipation due to opioid therapy    GERD (gastroesophageal reflux disease)    04/07/2019- not current   Hyperlipidemia    Hypertension    Dr. [name] [phone]   Ischemic cardiomyopathy    a. LV-gram at time of LHC in 6/17 with EF 35-45%  //  b. 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"   Numbness and tingling of both lower extremities    "on the outside of both sides" (02/18/2017)   Rheumatoid arthritis (HCC)    RA   Tachycardia    Type II diabetes mellitus ([place])    diet controlled    Past Surgical History:  Procedure Laterality Date   BACK SURGERY     x3   CARDIAC CATHETERIZATION N/A 07/15/2015   Procedure: Left Heart Cath and Coronary Angiography;  Surgeon: [name], MD;  Location: [facility];  Service: Cardiovascular;  Laterality: N/A;   CARDIAC CATHETERIZATION N/A 07/15/2015   Procedure: Coronary Stent Intervention;  Surgeon: [name], MD;  Location: [facility];  Service: Cardiovascular;  Laterality: N/A;   CARDIAC  CATHETERIZATION N/A 08/07/2015   Procedure: Left Heart Cath and Coronary Angiography;  Surgeon: [name], MD;  Location: [facility];  Service: Cardiovascular;  Laterality: N/A;   CORONARY ANGIOPLASTY WITH STENT PLACEMENT  07/2015   LEFT HEART CATH AND CORONARY ANGIOGRAPHY N/A 02/19/2017   Procedure: LEFT HEART CATH AND CORONARY ANGIOGRAPHY;  Surgeon: Martinique, Peter M, MD;  Location: [facility];  Service: Cardiovascular;  Laterality: N/A;   LUMBAR FUSION  04/11/2019   REVISION OF THORACOLUMBAR FUSION    LUMBAR SPINE SURGERY  03/2009  & 2012   MASS EXCISION N/A 04/19/2012   Procedure: removal of posterior cervical lipoma;  Surgeon: [name], MD;  Location: [facility];  Service: Neurosurgery;  Laterality: N/A;  Removal of posterior cervical lipoma   POPLITEAL SYNOVIAL CYST EXCISION Left    "opened up behind my knee; scraped out arthritis"   POSTERIOR LUMBAR FUSION 4 LEVEL N/A 11/22/2018   Procedure: Posterior Lateral and Interbody fusion - Lumbar one-Lumbar two; explore fusion, posterior instrumentation and fusion Thoracic ten to the ilium;  Surgeon: Newman Pies, MD;  Location: [facility];  Service: Neurosurgery;  Laterality: N/A;   SPINAL CORD STIMULATOR INSERTION N/A 01/07/2013   Procedure:  SPINAL CORD STIMULATOR INSERTION;  Surgeon: [name], MD;  Location: [facility];  Service: Neurosurgery;  Laterality: N/A;   SPINAL CORD STIMULATOR INSERTION N/A 02/09/2019   Procedure: REPLACEMENT OF LUMBAR SPINAL CORD STIMULATOR;  Surgeon: Newman Pies, MD;  Location: [facility];  Service: Neurosurgery;  Laterality: N/A;  Thoracic/Lumbar spine   TONSILLECTOMY     patient denies   TOTAL HIP ARTHROPLASTY Right 10/03/2016   Procedure: TOTAL HIP ARTHROPLASTY;  Surgeon: [name], MD;  Location: [facility];  Service: Orthopedics;  Laterality: Right;   WISDOM TOOTH EXTRACTION     hx of    Current Outpatient Medications  Medication Sig Dispense Refill Last Dose   ACCU-CHEK  AVIVA PLUS test strip       Accu-Chek Softclix Lancets lancets 4 (four) times daily.      acetaminophen (TYLENOL) 500 MG tablet Take 1,000 mg by mouth 2 (two) times daily as needed (pain.).      aspirin EC 81 MG tablet Take 1 tablet (81 mg total) by mouth daily. 30 tablet 1    atorvastatin (LIPITOR) 80 MG tablet Take 80 mg by mouth daily at 6 PM.      buprenorphine (SUBUTEX) 2 MG SUBL SL tablet PLACE 1 TABLET UNDER TONGUE TWO TIMES DAILY (Patient not taking: Reported on 10/01/2020) 60 tablet 0    buprenorphine (SUBUTEX) 8 MG SUBL SL tablet Place 1/2 Tablet under the tongue two times daily 30 tablet 0    Buprenorphine HCl (BELBUCA) 900 MCG FILM Place 1 film inside cheeks twice daily as directed 60 each 0    carvedilol (COREG) 12.5 MG tablet TAKE 1 TABLET(12.5 MG) BY MOUTH TWICE DAILY 180 tablet 1    celecoxib (CELEBREX) 400 MG capsule Take 1 capsule by mouth once a day at  the start of a meal 90 capsule 1    chlorthalidone (HYGROTON) 25 MG tablet TAKE 1 TABLET BY MOUTH DAILY 90 tablet 0    cyclobenzaprine (FLEXERIL) 10 MG tablet Take 1 tablet (10 mg total) by mouth 3 (three) times daily as needed for muscle spasms. 30 tablet 0    diclofenac Sodium (VOLTAREN) 1 % GEL Apply a small amount topically to affected area 2 times a day as needed. 100 g 5    Diclofenac Sodium-Menthol (DITHOL) 1.5 & 10 % THPK Apply topically.      DILT-XR 180 MG 24 hr capsule Take 180 mg by mouth daily.   4    docusate sodium (COLACE) 100 MG capsule Take 1 capsule (100 mg total) by mouth 2 (two) times daily. 10 capsule 0    DULoxetine (CYMBALTA) 60 MG capsule Take 1 capsule (60 mg total) by mouth daily. 90 capsule 0    glipiZIDE (GLUCOTROL) 10 MG tablet TAKE 1 TABLET BY MOUTH DAILY 90 tablet 0    glipiZIDE (GLUCOTROL) 5 MG tablet Take 5 mg by mouth daily before breakfast.       lisinopril (PRINIVIL,ZESTRIL) 20 MG tablet Take 20 mg by mouth at bedtime.   3    lubiprostone (AMITIZA) 24 MCG capsule Take 48 mcg by mouth at bedtime.        metFORMIN (GLUCOPHAGE XR) 500 MG 24 hr tablet Take 2 tablets (1,000 mg total) by mouth 2 (two) times daily. 360 tablet 0    Multiple Vitamin (MULTIVITAMIN WITH MINERALS) TABS tablet Take 1 tablet by mouth daily.      omeprazole (PRILOSEC) 40 MG capsule Take 1 capsule (40 mg total) by mouth daily. 90 capsule 0    Oxycodone HCl 20 MG TABS Take 1 tablet by mouth every six hours as needed 120 tablet 0    Oxycodone HCl 20 MG TABS Take 1 tablet by mouth every six hours as needed 116 tablet 0    polyethylene glycol powder (GLYCOLAX/MIRALAX) powder Take 17 g by mouth daily. Mix in 8 oz water, juice, soda, coffee or tea and drink 250 g 0    potassium chloride SA (KLOR-CON) 20 MEQ tablet Take 1 tablet (20 mEq total) by mouth daily. 90 tablet 1    pregabalin (LYRICA) 150 MG capsule Take 1 capsule (150 mg total) by mouth 2 (two) times daily. 60 capsule 0    pregabalin (LYRICA) 200 MG capsule Take 1 capsule (200 mg total) by mouth 2 (two) times daily. (Patient not taking: Reported on 10/01/2020) 60 capsule 3    pregabalin (LYRICA) 225 MG capsule TAKE 1 (ONE) CAPSULE BY MOUTH TWO TIMES DAILY 60 capsule 0    QC LIDOCAINE PAIN RELIEF EX Place 1 patch onto the skin daily as needed (pain.).      senna-docusate (SENOKOT S) 8.6-50 MG tablet Take 1 tablet by mouth 2 (two) times daily.      sildenafil (VIAGRA) 100 MG tablet Take 100 mg by mouth as needed.      No current facility-administered medications for this visit.   Allergies  Allergen Reactions   Brilinta [Ticagrelor] Shortness Of Breath and Other (See Comments)    CHEST PAIN    Methylprednisolone Other (See Comments)    DIAPHORESIS, HYPOTENSION   Prednisone Other (See Comments)    DIAPHORESIS, HYPOTENSION   Toradol [Ketorolac Tromethamine] Other (See Comments)    DIAPHORESIS, HYPOTENSION     Adhesive [Tape] Rash and Other (See Comments)    "blisters" ( NO  SURGICAL TAPE, PLEASE)    Social History   Tobacco Use   Smoking status: Former     Packs/day: 0.25    Years: 40.00    Pack years: 10.00    Types: Cigarettes    Quit date: 2008    Years since quitting: 14.6   Smokeless tobacco: Never   Tobacco comments:    02/18/2017 "quit in 2012"  Substance Use Topics   Alcohol use: No    Alcohol/week: 0.0 standard drinks    Family History  Problem Relation Age of Onset   Heart disease Mother    Prostate cancer Brother      Review of Systems  Cardiovascular:  Positive for chest pain, palpitations and leg swelling.  Gastrointestinal:  Positive for constipation.  Musculoskeletal:  Positive for arthralgias and back pain.  Neurological:  Positive for headaches.  All other systems reviewed and are negative.  Objective:  Physical Exam Constitutional:      General: He is not in acute distress.    Appearance: Normal appearance.  HENT:     Head: Normocephalic and atraumatic.  Eyes:     Extraocular Movements: Extraocular movements intact.     Pupils: Pupils are equal, round, and reactive to light.  Cardiovascular:     Rate and Rhythm: Normal rate and regular rhythm.     Pulses: Normal pulses.     Heart sounds: Normal heart sounds. No murmur heard. Pulmonary:     Effort: Pulmonary effort is normal. No respiratory distress.     Breath sounds: Normal breath sounds.  Abdominal:     General: Abdomen is flat. Bowel sounds are normal. There is no distension.     Palpations: Abdomen is soft.     Tenderness: There is no abdominal tenderness.  Musculoskeletal:     Cervical back: Normal range of motion and neck supple.     Comments: :  examination of the right lower extremity shows he is neurovascularly intact.  Intact dorsiflexion and plantarflexion of the ankle.  There is no calf tenderness to palpation.  He has joint line tenderness with the knee.  Positive patellofemoral crepitus.  Stable varus and valgus testing.  Good range of motion.    Lymphadenopathy:     Cervical: No cervical adenopathy.  Skin:    General: Skin is warm  and dry.     Findings: No erythema or rash.  Neurological:     General: No focal deficit present.     Mental Status: He is alert and oriented to person, place, and time.  Psychiatric:        Mood and Affect: Mood normal.        Behavior: Behavior normal.    Vital signs in last 24 hours: '@VSRANGES'$ @  Labs:   Estimated body mass index is 36.45 kg/m as calculated from the following:   Height as of 10/01/20: '5\' 10"'$  (1.778 m).   Weight as of 10/01/20: 115.2 kg.   Imaging Review Plain radiographs demonstrate moderate degenerative joint disease of the right knee(s). The overall alignment ismild varus. The bone quality appears to be good for age and reported activity level.      Assessment/Plan:  End stage arthritis, right knee   The patient history, physical examination, clinical judgment of the provider and imaging studies are consistent with end stage degenerative joint disease of the right knee(s) and total knee arthroplasty is deemed medically necessary. The treatment options including medical management, injection therapy arthroscopy and arthroplasty were discussed at length. The  risks and benefits of total knee arthroplasty were presented and reviewed. The risks due to aseptic loosening, infection, stiffness, patella tracking problems, thromboembolic complications and other imponderables were discussed. The patient acknowledged the explanation, agreed to proceed with the plan and consent was signed. Patient is being admitted for inpatient treatment for surgery, pain control, PT, OT, prophylactic antibiotics, VTE prophylaxis, progressive ambulation and ADL's and discharge planning. The patient is planning to be discharged  home with outpt PT    Anticipated LOS equal to or greater than 2 midnights due to - Age 33 and older with one or more of the following:  - Obesity  - Expected need for hospital services (PT, OT, Nursing) required for safe  discharge  - Anticipated need for  postoperative skilled nursing care or inpatient rehab  - Active co-morbidities: Chronic pain requiring opiods, Diabetes, Coronary Artery Disease, and Heart Failure OR   - Unanticipated findings during/Post Surgery: None  - Patient is a high risk of re-admission due to: None

## 2020-10-25 NOTE — H&P (Signed)
TOTAL KNEE ADMISSION H&P  Patient is being admitted for right total knee arthroplasty.  Subjective:  Chief Complaint:right knee pain.  HPI: Thomas Mcclure, 66 y.o. male, has a history of pain and functional disability in the right knee due to arthritis and has failed non-surgical conservative treatments for greater than 12 weeks to includeNSAID's and/or analgesics, corticosteriod injections, viscosupplementation injections, flexibility and strengthening excercises, use of assistive devices, and activity modification.  Onset of symptoms was gradual, starting >10 years ago with gradually worsening course since that time. The patient noted prior procedures on the knee to include  arthroscopy and menisectomy on the right knee(s).  Patient currently rates pain in the right knee(s) at 10 out of 10 with activity. Patient has night pain, worsening of pain with activity and weight bearing, pain that interferes with activities of daily living, pain with passive range of motion, crepitus, and joint swelling.  Patient has evidence of periarticular osteophytes and joint space narrowing by imaging studies.There is no active infection.  Patient Active Problem List   Diagnosis Date Noted   Chronic low back pain 02/09/2019   Spinal stenosis of lumbar region with neurogenic claudication 11/22/2018   Radiculopathy of lumbar region 05/31/2017   HCAP (healthcare-associated pneumonia) 05/30/2017   Leukocytosis 05/30/2017   Lumbar pseudoarthrosis 05/27/2017   Unstable angina ([place]) 02/18/2017   Primary localized osteoarthritis of right hip 10/03/2016   Chest pain, rule out acute myocardial infarction 05/01/2016   Diabetes mellitus type 2, controlled, with complications ([place]) [id number]   Obesity (BMI 30-39.9) 05/01/2016   Left ventricular aneurysm 12/03/2015   Ischemic cardiomyopathy    CAD (coronary artery disease) 07/16/2015   Hypokalemia 07/16/2015   History of non-ST elevation myocardial infarction (NSTEMI)  07/15/2015   Hypertension 07/15/2015   Hyperlipidemia with target LDL less than 100 07/15/2015   Back pain 07/15/2015   GERD (gastroesophageal reflux disease) 06/18/2012   Chronic radicular low back pain 06/18/2012   Past Medical History:  Diagnosis Date   Baker's cyst    Left calf   CAD in native artery, 07/15/15 PCI of RCA with DES 07/16/2015   a.   NSTEMI 5/17: LHC - pLAD 20, pLCx 20, OM1 30, pRCA 100, EF 45-50%>> PCI: 2.5 x 24 mm Promus DES to RCA  //  b.   Echo 5/17: mild LVH, EF 50-55%, no RWMA, mod RVE //  c. LHC 6/17: pLAD 20, pLCx 20, OM1 50, pRCA stent ok, EF 35-45% with mod sized inf wall and basal segment aneurysm   CHF (congestive heart failure) (HCC)    Chronic back pain    "down my back, down my legs" (02/18/2017)   Constipation    Constipation due to opioid therapy    GERD (gastroesophageal reflux disease)    04/07/2019- not current   Hyperlipidemia    Hypertension    Dr. [name] [phone]   Ischemic cardiomyopathy    a. LV-gram at time of LHC in 6/17 with EF 35-45%  //  b. 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"   Numbness and tingling of both lower extremities    "on the outside of both sides" (02/18/2017)   Rheumatoid arthritis (HCC)    RA   Tachycardia    Type II diabetes mellitus ([place])    diet controlled    Past Surgical History:  Procedure Laterality Date   BACK SURGERY     x3   CARDIAC CATHETERIZATION N/A 07/15/2015   Procedure: Left Heart Cath and Coronary Angiography;  Surgeon: [name], MD;  Location: [place] [facility];  Service: Cardiovascular;  Laterality: N/A;   CARDIAC CATHETERIZATION N/A 07/15/2015   Procedure: Coronary Stent Intervention;  Surgeon: [name], MD;  Location: [place] [facility];  Service: Cardiovascular;  Laterality: N/A;   CARDIAC  CATHETERIZATION N/A 08/07/2015   Procedure: Left Heart Cath and Coronary Angiography;  Surgeon: [name], MD;  Location: [place] [facility];  Service: Cardiovascular;  Laterality: N/A;   CORONARY ANGIOPLASTY WITH STENT PLACEMENT  07/2015   LEFT HEART CATH AND CORONARY ANGIOGRAPHY N/A 02/19/2017   Procedure: LEFT HEART CATH AND CORONARY ANGIOGRAPHY;  Surgeon: Martinique, Peter M, MD;  Location: [place] [facility];  Service: Cardiovascular;  Laterality: N/A;   LUMBAR FUSION  04/11/2019   REVISION OF THORACOLUMBAR FUSION    LUMBAR SPINE SURGERY  03/2009  & 2012   MASS EXCISION N/A 04/19/2012   Procedure: removal of posterior cervical lipoma;  Surgeon: [name], MD;  Location: [facility] [facility];  Service: Neurosurgery;  Laterality: N/A;  Removal of posterior cervical lipoma   POPLITEAL SYNOVIAL CYST EXCISION Left    "opened up behind my knee; scraped out arthritis"   POSTERIOR LUMBAR FUSION 4 LEVEL N/A 11/22/2018   Procedure: Posterior Lateral and Interbody fusion - Lumbar one-Lumbar two; explore fusion, posterior instrumentation and fusion Thoracic ten to the ilium;  Surgeon: Newman Pies, MD;  Location: [facility];  Service: Neurosurgery;  Laterality: N/A;   SPINAL CORD STIMULATOR INSERTION N/A 01/07/2013   Procedure:  SPINAL CORD STIMULATOR INSERTION;  Surgeon: [name], MD;  Location: [place] [facility];  Service: Neurosurgery;  Laterality: N/A;   SPINAL CORD STIMULATOR INSERTION N/A 02/09/2019   Procedure: REPLACEMENT OF LUMBAR SPINAL CORD STIMULATOR;  Surgeon: Newman Pies, MD;  Location: [facility];  Service: Neurosurgery;  Laterality: N/A;  Thoracic/Lumbar spine   TONSILLECTOMY     patient denies   TOTAL HIP ARTHROPLASTY Right 10/03/2016   Procedure: TOTAL HIP ARTHROPLASTY;  Surgeon: [name], MD;  Location: [place];  Service: Orthopedics;  Laterality: Right;   WISDOM TOOTH EXTRACTION     hx of    Current Outpatient Medications  Medication Sig Dispense Refill Last Dose   ACCU-CHEK  AVIVA PLUS test strip       Accu-Chek Softclix Lancets lancets 4 (four) times daily.      acetaminophen (TYLENOL) 500 MG tablet Take 1,000 mg by mouth 2 (two) times daily as needed (pain.).      aspirin EC 81 MG tablet Take 1 tablet (81 mg total) by mouth daily. 30 tablet 1    atorvastatin (LIPITOR) 80 MG tablet Take 80 mg by mouth daily at 6 PM.      buprenorphine (SUBUTEX) 2 MG SUBL SL tablet PLACE 1 TABLET UNDER TONGUE TWO TIMES DAILY (Patient not taking: Reported on 10/01/2020) 60 tablet 0    buprenorphine (SUBUTEX) 8 MG SUBL SL tablet Place 1/2 Tablet under the tongue two times daily 30 tablet 0    Buprenorphine HCl (BELBUCA) 900 MCG FILM Place 1 film inside cheeks twice daily as directed 60 each 0    carvedilol (COREG) 12.5 MG tablet TAKE 1 TABLET(12.5 MG) BY MOUTH TWICE DAILY 180 tablet 1    celecoxib (CELEBREX) 400 MG capsule Take 1 capsule by mouth once a day at  the start of a meal 90 capsule 1    chlorthalidone (HYGROTON) 25 MG tablet TAKE 1 TABLET BY MOUTH DAILY 90 tablet 0    cyclobenzaprine (FLEXERIL) 10 MG tablet Take 1 tablet (10 mg total) by mouth 3 (three) times daily as needed for muscle spasms. 30 tablet 0    diclofenac Sodium (VOLTAREN) 1 % GEL Apply a small amount topically to affected area 2 times a day as needed. 100 g 5    Diclofenac Sodium-Menthol (DITHOL) 1.5 & 10 % THPK Apply topically.      DILT-XR 180 MG 24 hr capsule Take 180 mg by mouth daily.   4    docusate sodium (COLACE) 100 MG capsule Take 1 capsule (100 mg total) by mouth 2 (two) times daily. 10 capsule 0    DULoxetine (CYMBALTA) 60 MG capsule Take 1 capsule (60 mg total) by mouth daily. 90 capsule 0    glipiZIDE (GLUCOTROL) 10 MG tablet TAKE 1 TABLET BY MOUTH DAILY 90 tablet 0    glipiZIDE (GLUCOTROL) 5 MG tablet Take 5 mg by mouth daily before breakfast.       lisinopril (PRINIVIL,ZESTRIL) 20 MG tablet Take 20 mg by mouth at bedtime.   3    lubiprostone (AMITIZA) 24 MCG capsule Take 48 mcg by mouth at bedtime.        metFORMIN (GLUCOPHAGE XR) 500 MG 24 hr tablet Take 2 tablets (1,000 mg total) by mouth 2 (two) times daily. 360 tablet 0    Multiple Vitamin (MULTIVITAMIN WITH MINERALS) TABS tablet Take 1 tablet by mouth daily.      omeprazole (PRILOSEC) 40 MG capsule Take 1 capsule (40 mg total) by mouth daily. 90 capsule 0    Oxycodone HCl 20 MG TABS Take 1 tablet by mouth every six hours as needed 120 tablet 0    Oxycodone HCl 20 MG TABS Take 1 tablet by mouth every six hours as needed 116 tablet 0    polyethylene glycol powder (GLYCOLAX/MIRALAX) powder Take 17 g by mouth daily. Mix in 8 oz water, juice, soda, coffee or tea and drink 250 g 0    potassium chloride SA (KLOR-CON) 20 MEQ tablet Take 1 tablet (20 mEq total) by mouth daily. 90 tablet 1    pregabalin (LYRICA) 150 MG capsule Take 1 capsule (150 mg total) by mouth 2 (two) times daily. 60 capsule 0    pregabalin (LYRICA) 200 MG capsule Take 1 capsule (200 mg total) by mouth 2 (two) times daily. (Patient not taking: Reported on 10/01/2020) 60 capsule 3    pregabalin (LYRICA) 225 MG capsule TAKE 1 (ONE) CAPSULE BY MOUTH TWO TIMES DAILY 60 capsule 0    QC LIDOCAINE PAIN RELIEF EX Place 1 patch onto the skin daily as needed (pain.).      senna-docusate (SENOKOT S) 8.6-50 MG tablet Take 1 tablet by mouth 2 (two) times daily.      sildenafil (VIAGRA) 100 MG tablet Take 100 mg by mouth as needed.      No current facility-administered medications for this visit.   Allergies  Allergen Reactions   Brilinta [Ticagrelor] Shortness Of Breath and Other (See Comments)    CHEST PAIN    Methylprednisolone Other (See Comments)    DIAPHORESIS, HYPOTENSION   Prednisone Other (See Comments)    DIAPHORESIS, HYPOTENSION   Toradol [Ketorolac Tromethamine] Other (See Comments)    DIAPHORESIS, HYPOTENSION     Adhesive [Tape] Rash and Other (See Comments)    "blisters" ( NO  SURGICAL TAPE, PLEASE)    Social History   Tobacco Use   Smoking status: Former     Packs/day: 0.25    Years: 40.00    Pack years: 10.00    Types: Cigarettes    Quit date: 2008    Years since quitting: 14.6   Smokeless tobacco: Never   Tobacco comments:    02/18/2017 "quit in 2012"  Substance Use Topics   Alcohol use: No    Alcohol/week: 0.0 standard drinks    Family History  Problem Relation Age of Onset   Heart disease Mother    Prostate cancer Brother      Review of Systems  Cardiovascular:  Positive for chest pain, palpitations and leg swelling.  Gastrointestinal:  Positive for constipation.  Musculoskeletal:  Positive for arthralgias and back pain.  Neurological:  Positive for headaches.  All other systems reviewed and are negative.  Objective:  Physical Exam Constitutional:      General: He is not in acute distress.    Appearance: Normal appearance.  HENT:     Head: Normocephalic and atraumatic.  Eyes:     Extraocular Movements: Extraocular movements intact.     Pupils: Pupils are equal, round, and reactive to light.  Cardiovascular:     Rate and Rhythm: Normal rate and regular rhythm.     Pulses: Normal pulses.     Heart sounds: Normal heart sounds. No murmur heard. Pulmonary:     Effort: Pulmonary effort is normal. No respiratory distress.     Breath sounds: Normal breath sounds.  Abdominal:     General: Abdomen is flat. Bowel sounds are normal. There is no distension.     Palpations: Abdomen is soft.     Tenderness: There is no abdominal tenderness.  Musculoskeletal:     Cervical back: Normal range of motion and neck supple.     Comments: :  examination of the right lower extremity shows he is neurovascularly intact.  Intact dorsiflexion and plantarflexion of the ankle.  There is no calf tenderness to palpation.  He has joint line tenderness with the knee.  Positive patellofemoral crepitus.  Stable varus and valgus testing.  Good range of motion.    Lymphadenopathy:     Cervical: No cervical adenopathy.  Skin:    General: Skin is warm  and dry.     Findings: No erythema or rash.  Neurological:     General: No focal deficit present.     Mental Status: He is alert and oriented to person, place, and time.  Psychiatric:        Mood and Affect: Mood normal.        Behavior: Behavior normal.    Vital signs in last 24 hours: '@VSRANGES'$ @  Labs:   Estimated body mass index is 36.45 kg/m as calculated from the following:   Height as of 10/01/20: '5\' 10"'$  (1.778 m).   Weight as of 10/01/20: 115.2 kg.   Imaging Review Plain radiographs demonstrate moderate degenerative joint disease of the right knee(s). The overall alignment ismild varus. The bone quality appears to be good for age and reported activity level.      Assessment/Plan:  End stage arthritis, right knee   The patient history, physical examination, clinical judgment of the provider and imaging studies are consistent with end stage degenerative joint disease of the right knee(s) and total knee arthroplasty is deemed medically necessary. The treatment options including medical management, injection therapy arthroscopy and arthroplasty were discussed at length. The  risks and benefits of total knee arthroplasty were presented and reviewed. The risks due to aseptic loosening, infection, stiffness, patella tracking problems, thromboembolic complications and other imponderables were discussed. The patient acknowledged the explanation, agreed to proceed with the plan and consent was signed. Patient is being admitted for inpatient treatment for surgery, pain control, PT, OT, prophylactic antibiotics, VTE prophylaxis, progressive ambulation and ADL's and discharge planning. The patient is planning to be discharged  home with outpt PT    Anticipated LOS equal to or greater than 2 midnights due to - Age 32 and older with one or more of the following:  - Obesity  - Expected need for hospital services (PT, OT, Nursing) required for safe  discharge  - Anticipated need for  postoperative skilled nursing care or inpatient rehab  - Active co-morbidities: Chronic pain requiring opiods, Diabetes, Coronary Artery Disease, and Heart Failure OR   - Unanticipated findings during/Post Surgery: None  - Patient is a high risk of re-admission due to: None

## 2020-10-26 NOTE — Progress Notes (Signed)
DUE TO COVID-19 ONLY ONE VISITOR IS ALLOWED TO COME WITH YOU AND STAY IN THE WAITING ROOM ONLY DURING PRE OP AND PROCEDURE DAY OF SURGERY.  2 VISITOR  MAY VISIT WITH YOU AFTER SURGERY IN YOUR PRIVATE ROOM DURING VISITING HOURS ONLY!  YOU NEED TO HAVE A COVID 19 TEST ON______AME'@_'$  '@_from'$  8am-3pm _____, THIS TEST MUST BE DONE BEFORE SURGERY,  Covid test is done at [place], [place] [address].  This is a drive thru.  No appt required. Please see map.                 Your procedure is scheduled on:  11/09/2020   Report to [facility] Main  Entrance   Report to admitting at      [phone]     Call this number if you have problems the morning of surgery [phone]    REMEMBER: NO  SOLID FOOD CANDY OR GUM AFTER MIDNIGHT. CLEAR LIQUIDS UNTIL    0720am         . NOTHING BY MOUTH EXCEPT CLEAR LIQUIDS UNTIL 0720am   . PLEASE FINISH ENSURE DRINK PER SURGEON ORDER  WHICH NEEDS TO BE COMPLETED AT     0720am  .      CLEAR LIQUID DIET   Foods Allowed                                                                    Coffee and tea, regular and decaf                            Fruit ices (not with fruit pulp)                                      Iced Popsicles                                    Carbonated beverages, regular and diet                                    Cranberry, grape and apple juices Sports drinks like Gatorade Lightly seasoned clear broth or consume(fat free) Sugar, honey syrup ___________________________________________________________________      BRUSH YOUR TEETH MORNING OF SURGERY AND RINSE YOUR MOUTH OUT, NO CHEWING GUM CANDY OR MINTS.     Take these medicines the morning of surgery with A SIP OF WATER:  lyrica, prilosec, coreg, dilt xr, cymbalta   DO NOT TAKE ANY DIABETIC MEDICATIONS DAY OF YOUR SURGERY                               You may not have any metal on your body including hair pins and              piercings  Do not wear jewelry,  make-up, lotions, powders or perfumes, deodorant  Do not wear nail polish on your fingernails.  Do not shave  48 hours prior to surgery.              Men may shave face and neck.   Do not bring valuables to the hospital. [place].  Contacts, dentures or bridgework may not be worn into surgery.  Leave suitcase in the car. After surgery it may be brought to your room.     Patients discharged the day of surgery will not be allowed to drive home. IF YOU ARE HAVING SURGERY AND GOING HOME THE SAME DAY, YOU MUST HAVE AN ADULT TO DRIVE YOU HOME AND BE WITH YOU FOR 24 HOURS. YOU MAY GO HOME BY TAXI OR UBER OR ORTHERWISE, BUT AN ADULT MUST ACCOMPANY YOU HOME AND STAY WITH YOU FOR 24 HOURS.  Name and phone number of your driver:  Special Instructions: N/A              Please read over the following fact sheets you were given: _____________________________________________________________________  [facility] - Preparing for Surgery Before surgery, you can play an important role.  Because skin is not sterile, your skin needs to be as free of germs as possible.  You can reduce the number of germs on your skin by washing with CHG (chlorahexidine gluconate) soap before surgery.  CHG is an antiseptic cleaner which kills germs and bonds with the skin to continue killing germs even after washing. Please DO NOT use if you have an allergy to CHG or antibacterial soaps.  If your skin becomes reddened/irritated stop using the CHG and inform your nurse when you arrive at Short Stay. Do not shave (including legs and underarms) for at least 48 hours prior to the first CHG shower.  You may shave your face/neck. Please follow these instructions carefully:  1.  Shower with CHG Soap the night before surgery and the  morning of Surgery.  2.  If you choose to wash your hair, wash your hair first as usual with your  normal  shampoo.  3.  After you shampoo, rinse  your hair and body thoroughly to remove the  shampoo.                           4.  Use CHG as you would any other liquid soap.  You can apply chg directly  to the skin and wash                       Gently with a scrungie or clean washcloth.  5.  Apply the CHG Soap to your body ONLY FROM THE NECK DOWN.   Do not use on face/ open                           Wound or open sores. Avoid contact with eyes, ears mouth and genitals (private parts).                       Wash face,  Genitals (private parts) with your normal soap.             6.  Wash thoroughly, paying special attention to the area where your surgery  will be performed.  7.  Thoroughly rinse your body with  warm water from the neck down.  8.  DO NOT shower/wash with your normal soap after using and rinsing off  the CHG Soap.                9.  Pat yourself dry with a clean towel.            10.  Wear clean pajamas.            11.  Place clean sheets on your bed the night of your first shower and do not  sleep with pets. Day of Surgery : Do not apply any lotions/deodorants the morning of surgery.  Please wear clean clothes to the hospital/surgery center.  FAILURE TO FOLLOW THESE INSTRUCTIONS MAY RESULT IN THE CANCELLATION OF YOUR SURGERY PATIENT SIGNATURE_________________________________  NURSE SIGNATURE__________________________________  ________________________________________________________________________

## 2020-10-30 ENCOUNTER — Other Ambulatory Visit: Payer: Self-pay

## 2020-10-30 ENCOUNTER — Encounter (HOSPITAL_COMMUNITY)
Admission: RE | Admit: 2020-10-30 | Discharge: 2020-10-30 | Disposition: A | Discharge status: Home / Self Care | Payer: Medicare Other | Source: Ambulatory Visit | Attending: Anesthesiology | Admitting: Anesthesiology

## 2020-10-30 ENCOUNTER — Encounter (HOSPITAL_COMMUNITY)
Admission: RE | Admit: 2020-10-30 | Discharge: 2020-10-30 | Disposition: A | Discharge status: Home / Self Care | Payer: Medicare Other | Source: Ambulatory Visit | Attending: Physician Assistant | Admitting: Physician Assistant

## 2020-10-30 ENCOUNTER — Encounter (HOSPITAL_COMMUNITY): Payer: Self-pay

## 2020-10-30 NOTE — Progress Notes (Addendum)
Anesthesia Review:  PCP:LOV 07/17/20- Haley McClune- on chart - clearance on chart dated 10/11/2020  Cardiologist : DR Daneen Schick - [place] 10/01/20  on chart clearance in note  Chest x-ray : EKG : 10/01/20  Echo : 2020  Stress test: 2020  Cardiac Cath :  2018  Activity level: can do a flight of stairs without difficulty  Sleep Study/ CPAP : none  Fasting Blood Sugar :      / Checks Blood Sugar -- times a day:   Blood Thinner/ Instructions /Last Dose: ASA / Instructions/ Last Dose :   Ambulatory surgery- no covid needed  Spinal cord Stimulator- pt to bring remote  DM- type 2  Hgba1c- 11/01/20 -7.1 routed to Dr French Ana on 11/01/20.

## 2020-11-01 ENCOUNTER — Other Ambulatory Visit (HOSPITAL_COMMUNITY): Payer: Medicare Other

## 2020-11-01 ENCOUNTER — Encounter (HOSPITAL_COMMUNITY)
Admission: RE | Admit: 2020-11-01 | Discharge: 2020-11-01 | Disposition: A | Discharge status: Home / Self Care | Payer: Medicare Other | Source: Ambulatory Visit | Attending: Orthopedic Surgery | Admitting: Orthopedic Surgery

## 2020-11-01 ENCOUNTER — Other Ambulatory Visit (HOSPITAL_COMMUNITY): Payer: Self-pay

## 2020-11-01 ENCOUNTER — Other Ambulatory Visit: Payer: Self-pay

## 2020-11-01 DIAGNOSIS — E119 Type 2 diabetes mellitus without complications: Secondary | ICD-10-CM | POA: Diagnosis not present

## 2020-11-01 DIAGNOSIS — Z01812 Encounter for preprocedural laboratory examination: Secondary | ICD-10-CM | POA: Insufficient documentation

## 2020-11-01 DIAGNOSIS — M1711 Unilateral primary osteoarthritis, right knee: Secondary | ICD-10-CM | POA: Diagnosis not present

## 2020-11-01 LAB — CBC WITH DIFFERENTIAL/PLATELET
Abs Immature Granulocytes: 0.03 10*3/uL (ref 0.00–0.07)
Basophils Absolute: 0 10*3/uL (ref 0.0–0.1)
Basophils Relative: 1 %
Eosinophils Absolute: 0.4 10*3/uL (ref 0.0–0.5)
Eosinophils Relative: 6 %
HCT: 40.4 % (ref 39.0–52.0)
Hemoglobin: 12.6 g/dL — ABNORMAL LOW (ref 13.0–17.0)
Immature Granulocytes: 0 %
Lymphocytes Relative: 26 %
Lymphs Abs: 1.9 10*3/uL (ref 0.7–4.0)
MCH: 24.6 pg — ABNORMAL LOW (ref 26.0–34.0)
MCHC: 31.2 g/dL (ref 30.0–36.0)
MCV: 78.8 fL — ABNORMAL LOW (ref 80.0–100.0)
Monocytes Absolute: 0.8 10*3/uL (ref 0.1–1.0)
Monocytes Relative: 11 %
Neutro Abs: 4.2 10*3/uL (ref 1.7–7.7)
Neutrophils Relative %: 56 %
Platelets: 262 10*3/uL (ref 150–400)
RBC: 5.13 MIL/uL (ref 4.22–5.81)
RDW: 18 % — ABNORMAL HIGH (ref 11.5–15.5)
WBC: 7.4 10*3/uL (ref 4.0–10.5)
nRBC: 0 % (ref 0.0–0.2)

## 2020-11-01 LAB — APTT: aPTT: 30 seconds (ref 24–36)

## 2020-11-01 LAB — COMPREHENSIVE METABOLIC PANEL
ALT: 23 U/L (ref 0–44)
AST: 23 U/L (ref 15–41)
Albumin: 4.2 g/dL (ref 3.5–5.0)
Alkaline Phosphatase: 89 U/L (ref 38–126)
Anion gap: 12 (ref 5–15)
BUN: 24 mg/dL — ABNORMAL HIGH (ref 8–23)
CO2: 25 mmol/L (ref 22–32)
Calcium: 9.4 mg/dL (ref 8.9–10.3)
Chloride: 100 mmol/L (ref 98–111)
Creatinine, Ser: 1.1 mg/dL (ref 0.61–1.24)
GFR, Estimated: >60 mL/min (ref >60)
Glucose, Bld: 143 mg/dL — ABNORMAL HIGH (ref 70–99)
Potassium: 3.8 mmol/L (ref 3.5–5.1)
Sodium: 137 mmol/L (ref 135–145)
Total Bilirubin: 0.5 mg/dL (ref 0.3–1.2)
Total Protein: 7.5 g/dL (ref 6.5–8.1)

## 2020-11-01 LAB — PROTIME-INR
INR: 0.9 (ref 0.8–1.2)
Prothrombin Time: 12.2 seconds (ref 11.4–15.2)

## 2020-11-01 LAB — GLUCOSE, CAPILLARY: Glucose-Capillary: 174 mg/dL — ABNORMAL HIGH (ref 70–99)

## 2020-11-01 LAB — HEMOGLOBIN A1C
Hgb A1c MFr Bld: 7.1 % — ABNORMAL HIGH (ref 4.8–5.6)
Mean Plasma Glucose: 157.07 mg/dL

## 2020-11-01 NOTE — Anesthesia Preprocedure Evaluation (Addendum)
Anesthesia Evaluation  Patient identified by MRN, date of birth, ID band Patient awake    Reviewed: Allergy & Precautions, NPO status , Patient's Chart, lab work & pertinent test results  Airway Mallampati: II  TM Distance: >3 FB Neck ROM: Full    Dental  (+) Dental Advisory Given   Pulmonary former smoker,    breath sounds clear to auscultation       Cardiovascular hypertension, Pt. on medications and Pt. on home beta blockers + CAD, + Cardiac Stents and +CHF   Rhythm:Regular Rate:Normal     Neuro/Psych Spinal cord stimulator  Neuromuscular disease    GI/Hepatic Neg liver ROS, GERD  Medicated,  Endo/Other  diabetes, Type 2  Renal/GU negative Renal ROS     Musculoskeletal  (+) Arthritis ,   Abdominal   Peds  Hematology  (+) anemia ,   Anesthesia Other Findings   Reproductive/Obstetrics                            Lab Results  Component Value Date   WBC 7.4 11/01/2020   HGB 12.6 (L) 11/01/2020   HCT 40.4 11/01/2020   MCV 78.8 (L) 11/01/2020   PLT 262 11/01/2020   Lab Results  Component Value Date   CREATININE 1.10 11/01/2020   BUN 24 (H) 11/01/2020   NA 137 11/01/2020   K 3.8 11/01/2020   CL 100 11/01/2020   CO2 25 11/01/2020    Anesthesia Physical Anesthesia Plan  ASA: 3  Anesthesia Plan: General   Post-op Pain Management:  Regional for Post-op pain   Induction: Intravenous  PONV Risk Score and Plan: 2 and Dexamethasone, Ondansetron and Treatment may vary due to age or medical condition  Airway Management Planned: LMA  Additional Equipment:   Intra-op Plan:   Post-operative Plan: Extubation in OR  Informed Consent: I have reviewed the patients History and Physical, chart, labs and discussed the procedure including the risks, benefits and alternatives for the proposed anesthesia with the patient or authorized representative who has indicated his/her understanding  and acceptance.     Dental advisory given  Plan Discussed with: CRNA  Anesthesia Plan Comments:        Anesthesia Quick Evaluation

## 2020-11-01 NOTE — Progress Notes (Signed)
Anesthesia Chart Review   Case: [id number] Date/Time: 11/09/20 1006   Procedure: TOTAL KNEE ARTHROPLASTY (Right: Knee)   Anesthesia type: Choice   Pre-op diagnosis: OA RIGHT KNEE   Location: [facility] 05 / [facility]   Surgeons: [name], MD       DISCUSSION:65 y.o. former smoker with h/o HTN, GERD, DM II (A1C 7.1 on 11/01/20), CAD (NSTEMI in 2017 status post DES to the RCA), CHF, RA, chronic pain (on Bel[place] and oxycodone prn, spinal cord stimulator in place), right knee OA scheduled for above procedure 11/09/2020 with Dr. [name].   Pt with spinal cord stimulator in place, advised to bring remote.   Pt seen by cardiology 10/01/2020 for preoperative evaluation.  Per OV note, "Clinically stable from cardiac standpoint.  Essentially widely patent coronaries 2018 and nonischemic nuclear study 2020.  Has been on risk prevention since that time.  Not having angina.  Secondary prevention discussed. Upcoming surgery should go well.  He is cleared from cardiac standpoint without further testing required.  Will make sure that Dr. French Ana knows we have cleared him for right knee surgery."  Anticipate pt can proceed with planned procedure barring acute status change.   VS: There were no vitals taken for this visit.  PROVIDERS: Fredrich Romans, PA is PCP   Daneen Schick, MD is Cardiologist  LABS: Labs reviewed: Acceptable for surgery. (all labs ordered are listed, but only abnormal results are displayed)  Labs Reviewed - No data to display   IMAGES:   EKG: 08/01/2020 Rate 94 bpm  NSR   CV: Echo 10/15/2018  1. The left ventricle has mildly reduced systolic function, with an  ejection fraction of 45-50%. The cavity size was normal. There is mildly  increased left ventricular wall thickness. Left ventricular diastolic  Doppler parameters are consistent with  impaired relaxation.   2. Abnormal septal motion inferior basal hypokinesis.   3. The right ventricle has normal systolic  function. The cavity was  normal. There is no increase in right ventricular wall thickness.   4. Mild thickening of the mitral valve leaflet. Mild calcification of the  mitral valve leaflet. There is mild mitral annular calcification present.   5. The aortic valve is tricuspid. Mild thickening of the aortic valve.  Sclerosis without any evidence of stenosis of the aortic valve.   6. The aorta is normal unless otherwise noted.   7. The interatrial septum was not well visualized.   Stress Test 10/12/2018 Nuclear stress EF: 42%. The left ventricular ejection fraction is moderately decreased (30-44%). There was no ST segment deviation noted during stress. Defect 1: There is a small defect of severe severity present in the basal inferior and mid inferior location. Findings consistent with prior Inferior wall myocardial infarction. This is an intermediate risk study based on reduction of EF . There is no evidence of ischemia.  Cardiac Cath 02/19/2017 Prox LAD to Mid LAD lesion is 20% stenosed. Prox Cx to Mid Cx lesion is 20% stenosed. Ost 1st Mrg to 1st Mrg lesion is 50% stenosed. Non-stenotic Prox RCA lesion previously treated. The left ventricular systolic function is normal. LV end diastolic pressure is normal. The left ventricular ejection fraction is 50-55% by visual estimate.   1. Nonobstructive CAD. Patent stent in the RCA.  2. Overall good LV function 3. Normal LVEDP   Past Medical History:  Diagnosis Date   Baker's cyst    Left calf   CAD in native artery, 07/15/15 PCI of RCA with  DES 07/16/2015   a.   NSTEMI 5/17: LHC - pLAD 20, pLCx 20, OM1 30, pRCA 100, EF 45-50%>> PCI: 2.5 x 24 mm Promus DES to RCA  //  b.   Echo 5/17: mild LVH, EF 50-55%, no RWMA, mod RVE //  c. LHC 6/17: pLAD 20, pLCx 20, OM1 50, pRCA stent ok, EF 35-45% with mod sized inf wall and basal segment aneurysm   CHF (congestive heart failure) (HCC)    Chronic back pain    "down my back, down my legs"  (02/18/2017)   Constipation    Constipation due to opioid therapy    GERD (gastroesophageal reflux disease)    04/07/2019- not current   Hyperlipidemia    Hypertension    Dr. [name] [phone]   Ischemic cardiomyopathy    a. LV-gram at time of LHC in 6/17 with EF 35-45%  //  b. 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"   Numbness and tingling of both lower extremities    "on the outside of both sides" (02/18/2017)   Rheumatoid arthritis (HCC)    RA   Tachycardia    Type II diabetes mellitus ([place])    diet controlled    Past Surgical History:  Procedure Laterality Date   BACK SURGERY     x3   bilateral cataract curgery      CARDIAC CATHETERIZATION N/A 07/15/2015   Procedure: Left Heart Cath and Coronary Angiography;  Surgeon: [name], MD;  Location: [place] [facility];  Service: Cardiovascular;  Laterality: N/A;   CARDIAC CATHETERIZATION N/A 07/15/2015   Procedure: Coronary Stent Intervention;  Surgeon: [name], MD;  Location: [place] [facility];  Service: Cardiovascular;  Laterality: N/A;   CARDIAC CATHETERIZATION N/A 08/07/2015   Procedure: Left Heart Cath and Coronary Angiography;  Surgeon: [name], MD;  Location: [place] [facility];  Service: Cardiovascular;  Laterality: N/A;   CORONARY ANGIOPLASTY WITH STENT PLACEMENT  07/2015   LEFT HEART CATH AND CORONARY ANGIOGRAPHY N/A 02/19/2017   Procedure: LEFT HEART CATH AND CORONARY ANGIOGRAPHY;  Surgeon: Martinique, Peter M, MD;  Location: [place] [facility];  Service: Cardiovascular;  Laterality: N/A;   LUMBAR FUSION  04/11/2019   REVISION OF THORACOLUMBAR FUSION    LUMBAR SPINE SURGERY  03/2009  & 2012   MASS EXCISION N/A 04/19/2012   Procedure: removal of posterior cervical lipoma;  Surgeon: [name], MD;  Location: [place] [facility];  Service: Neurosurgery;  Laterality: N/A;  Removal of posterior cervical lipoma   POPLITEAL SYNOVIAL CYST EXCISION Left    "opened up behind my knee; scraped out arthritis"   POSTERIOR LUMBAR FUSION 4 LEVEL N/A 11/22/2018   Procedure: Posterior Lateral and Interbody fusion - Lumbar one-Lumbar two; explore fusion, posterior instrumentation and fusion Thoracic ten to the ilium;  Surgeon: Newman Pies, MD;  Location: [place];  Service: Neurosurgery;  Laterality: N/A;   SPINAL CORD STIMULATOR INSERTION N/A 01/07/2013   Procedure:  SPINAL CORD STIMULATOR INSERTION;  Surgeon: [name], MD;  Location: [place] [facility];  Service: Neurosurgery;  Laterality: N/A;   SPINAL CORD STIMULATOR INSERTION N/A 02/09/2019   Procedure: REPLACEMENT OF LUMBAR SPINAL CORD STIMULATOR;  Surgeon: Newman Pies, MD;  Location: [place];  Service: Neurosurgery;  Laterality: N/A;  Thoracic/Lumbar spine  TONSILLECTOMY     patient denies   TOTAL HIP ARTHROPLASTY Right 10/03/2016   Procedure: TOTAL HIP ARTHROPLASTY;  Surgeon: [name], MD;  Location: [place];  Service: Orthopedics;  Laterality: Right;   WISDOM TOOTH EXTRACTION     hx of    MEDICATIONS:  ACCU-CHEK AVIVA PLUS test strip   Accu-Chek Softclix Lancets lancets   acetaminophen (TYLENOL) 500 MG tablet   aspirin EC 81 MG tablet   atorvastatin (LIPITOR) 80 MG tablet   buprenorphine (SUBUTEX) 2 MG SUBL SL tablet   buprenorphine (SUBUTEX) 8 MG SUBL SL tablet   Buprenorphine HCl (BELBUCA) 900 MCG FILM   carvedilol (COREG) 12.5 MG tablet   celecoxib (CELEBREX) 400 MG capsule   chlorthalidone (HYGROTON) 25 MG tablet   cyclobenzaprine (FLEXERIL) 10 MG tablet   diclofenac Sodium (VOLTAREN) 1 % GEL   Diclofenac Sodium-Menthol (DITHOL) 1.5 & 10 % THPK   DILT-XR 180 MG 24 hr capsule   docusate sodium (COLACE) 100 MG capsule   DULoxetine (CYMBALTA) 60 MG capsule   glipiZIDE (GLUCOTROL) 10 MG tablet   glipiZIDE (GLUCOTROL) 5 MG tablet   lisinopril  (PRINIVIL,ZESTRIL) 20 MG tablet   lubiprostone (AMITIZA) 24 MCG capsule   metFORMIN (GLUCOPHAGE XR) 500 MG 24 hr tablet   Multiple Vitamin (MULTIVITAMIN WITH MINERALS) TABS tablet   omeprazole (PRILOSEC) 40 MG capsule   Oxycodone HCl 20 MG TABS   Oxycodone HCl 20 MG TABS   polyethylene glycol powder (GLYCOLAX/MIRALAX) powder   potassium chloride SA (KLOR-CON) 20 MEQ tablet   pregabalin (LYRICA) 150 MG capsule   pregabalin (LYRICA) 200 MG capsule   pregabalin (LYRICA) 225 MG capsule   QC LIDOCAINE PAIN RELIEF EX   senna-docusate (SENOKOT S) 8.6-50 MG tablet   sildenafil (VIAGRA) 100 MG tablet   No current facility-administered medications for this encounter.     Konrad Felix, PA-C [facility] [phone]

## 2020-11-06 ENCOUNTER — Other Ambulatory Visit (HOSPITAL_COMMUNITY): Payer: Self-pay

## 2020-11-06 MED ORDER — PREGABALIN 150 MG PO CAPS
ORAL_CAPSULE | ORAL | 0 refills | Status: DC
  Filled 2020-11-06: qty 60, 30d supply, fill #0

## 2020-11-06 MED ORDER — OXYCODONE HCL 20 MG PO TABS
ORAL_TABLET | ORAL | 0 refills | Status: DC
  Filled 2020-11-06: qty 120, 30d supply, fill #0

## 2020-11-06 MED ORDER — BELBUCA 900 MCG BU FILM
ORAL_FILM | BUCCAL | 0 refills | Status: DC
  Filled 2020-11-06: qty 60, 30d supply, fill #0

## 2020-11-07 ENCOUNTER — Other Ambulatory Visit (HOSPITAL_COMMUNITY): Payer: Self-pay

## 2020-11-08 MED ORDER — TRANEXAMIC ACID 1000 MG/10ML IV SOLN
2000.0000 mg | INTRAVENOUS | Status: DC
  Filled 2020-11-08: qty 20

## 2020-11-09 ENCOUNTER — Encounter (HOSPITAL_COMMUNITY): Payer: Self-pay | Admitting: Orthopedic Surgery

## 2020-11-09 ENCOUNTER — Inpatient Hospital Stay (HOSPITAL_COMMUNITY)
Admission: RE | Admit: 2020-11-09 | Discharge: 2020-11-11 | DRG: 470 | Disposition: A | Discharge status: Home / Self Care | Payer: Medicare Other | Attending: Orthopedic Surgery | Admitting: Orthopedic Surgery

## 2020-11-09 ENCOUNTER — Other Ambulatory Visit: Payer: Self-pay

## 2020-11-09 ENCOUNTER — Ambulatory Visit (HOSPITAL_COMMUNITY): Payer: Medicare Other | Admitting: Physician Assistant

## 2020-11-09 ENCOUNTER — Ambulatory Visit (HOSPITAL_COMMUNITY): Payer: Medicare Other | Admitting: Certified Registered Nurse Anesthetist

## 2020-11-09 ENCOUNTER — Encounter (HOSPITAL_COMMUNITY)
Admission: RE | Disposition: A | Discharge status: Home / Self Care | Payer: Self-pay | Source: Home / Self Care | Attending: Orthopedic Surgery

## 2020-11-09 DIAGNOSIS — I110 Hypertensive heart disease with heart failure: Secondary | ICD-10-CM | POA: Diagnosis present

## 2020-11-09 DIAGNOSIS — Z888 Allergy status to other drugs, medicaments and biological substances status: Secondary | ICD-10-CM

## 2020-11-09 DIAGNOSIS — Z96651 Presence of right artificial knee joint: Secondary | ICD-10-CM

## 2020-11-09 DIAGNOSIS — M21161 Varus deformity, not elsewhere classified, right knee: Secondary | ICD-10-CM | POA: Diagnosis present

## 2020-11-09 DIAGNOSIS — E669 Obesity, unspecified: Secondary | ICD-10-CM | POA: Diagnosis present

## 2020-11-09 DIAGNOSIS — M21261 Flexion deformity, right knee: Secondary | ICD-10-CM | POA: Diagnosis present

## 2020-11-09 DIAGNOSIS — E119 Type 2 diabetes mellitus without complications: Secondary | ICD-10-CM | POA: Diagnosis present

## 2020-11-09 DIAGNOSIS — Z79899 Other long term (current) drug therapy: Secondary | ICD-10-CM

## 2020-11-09 DIAGNOSIS — K219 Gastro-esophageal reflux disease without esophagitis: Secondary | ICD-10-CM | POA: Diagnosis present

## 2020-11-09 DIAGNOSIS — Z8249 Family history of ischemic heart disease and other diseases of the circulatory system: Secondary | ICD-10-CM

## 2020-11-09 DIAGNOSIS — Z7982 Long term (current) use of aspirin: Secondary | ICD-10-CM

## 2020-11-09 DIAGNOSIS — Z91048 Other nonmedicinal substance allergy status: Secondary | ICD-10-CM

## 2020-11-09 DIAGNOSIS — Z7984 Long term (current) use of oral hypoglycemic drugs: Secondary | ICD-10-CM

## 2020-11-09 DIAGNOSIS — Z8701 Personal history of pneumonia (recurrent): Secondary | ICD-10-CM

## 2020-11-09 DIAGNOSIS — I255 Ischemic cardiomyopathy: Secondary | ICD-10-CM | POA: Diagnosis present

## 2020-11-09 DIAGNOSIS — E785 Hyperlipidemia, unspecified: Secondary | ICD-10-CM | POA: Diagnosis present

## 2020-11-09 DIAGNOSIS — M549 Dorsalgia, unspecified: Secondary | ICD-10-CM | POA: Diagnosis present

## 2020-11-09 DIAGNOSIS — I252 Old myocardial infarction: Secondary | ICD-10-CM

## 2020-11-09 DIAGNOSIS — I2510 Atherosclerotic heart disease of native coronary artery without angina pectoris: Secondary | ICD-10-CM | POA: Diagnosis present

## 2020-11-09 DIAGNOSIS — Z87891 Personal history of nicotine dependence: Secondary | ICD-10-CM

## 2020-11-09 DIAGNOSIS — M25761 Osteophyte, right knee: Secondary | ICD-10-CM | POA: Diagnosis present

## 2020-11-09 DIAGNOSIS — Z981 Arthrodesis status: Secondary | ICD-10-CM

## 2020-11-09 DIAGNOSIS — M1711 Unilateral primary osteoarthritis, right knee: Principal | ICD-10-CM | POA: Diagnosis present

## 2020-11-09 DIAGNOSIS — M069 Rheumatoid arthritis, unspecified: Secondary | ICD-10-CM | POA: Diagnosis present

## 2020-11-09 DIAGNOSIS — Z955 Presence of coronary angioplasty implant and graft: Secondary | ICD-10-CM

## 2020-11-09 DIAGNOSIS — I509 Heart failure, unspecified: Secondary | ICD-10-CM | POA: Diagnosis present

## 2020-11-09 DIAGNOSIS — G8929 Other chronic pain: Secondary | ICD-10-CM | POA: Diagnosis present

## 2020-11-09 HISTORY — PX: TOTAL KNEE ARTHROPLASTY: SHX125

## 2020-11-09 LAB — GLUCOSE, CAPILLARY
Glucose-Capillary: 131 mg/dL — ABNORMAL HIGH (ref 70–99)
Glucose-Capillary: 107 mg/dL — ABNORMAL HIGH (ref 70–99)
Glucose-Capillary: 120 mg/dL — ABNORMAL HIGH (ref 70–99)
Glucose-Capillary: 206 mg/dL — ABNORMAL HIGH (ref 70–99)

## 2020-11-09 LAB — SURGICAL PCR SCREEN
MRSA, PCR: NEGATIVE
Staphylococcus aureus: NEGATIVE

## 2020-11-09 LAB — TYPE AND SCREEN
ABO/RH(D): B POS
Antibody Screen: NEGATIVE

## 2020-11-09 SURGERY — ARTHROPLASTY, KNEE, TOTAL
Anesthesia: General | Site: Knee | Laterality: Right

## 2020-11-09 MED ORDER — ONDANSETRON HCL 4 MG/2ML IJ SOLN
INTRAMUSCULAR | Status: DC | PRN
  Administered 2020-11-09: 4 mg via INTRAVENOUS

## 2020-11-09 MED ORDER — DULOXETINE HCL 60 MG PO CPEP
60.0000 mg | ORAL_CAPSULE | Freq: Every day | ORAL | Status: DC
  Administered 2020-11-10 – 2020-11-11 (×2): 60 mg via ORAL
  Filled 2020-11-09 (×2): qty 1

## 2020-11-09 MED ORDER — BUPIVACAINE-EPINEPHRINE (PF) 0.25% -1:200000 IJ SOLN
INTRAMUSCULAR | Status: AC
  Filled 2020-11-09: qty 30

## 2020-11-09 MED ORDER — OXYCODONE HCL 5 MG PO TABS: ORAL_TABLET | ORAL | 0 refills | Status: DC

## 2020-11-09 MED ORDER — FENTANYL CITRATE (PF) 100 MCG/2ML IJ SOLN
INTRAMUSCULAR | Status: DC | PRN
  Administered 2020-11-09 (×2): 50 ug via INTRAVENOUS

## 2020-11-09 MED ORDER — POVIDONE-IODINE 10 % EX SWAB
2.0000 "application " | Freq: Once | CUTANEOUS | Status: AC
  Administered 2020-11-09: 2 via TOPICAL

## 2020-11-09 MED ORDER — CELECOXIB 200 MG PO CAPS
200.0000 mg | ORAL_CAPSULE | Freq: Two times a day (BID) | ORAL | Status: DC
  Administered 2020-11-09 – 2020-11-11 (×4): 200 mg via ORAL
  Filled 2020-11-09 (×4): qty 1

## 2020-11-09 MED ORDER — SODIUM CHLORIDE 0.9 % IV SOLN: INTRAVENOUS | Status: DC

## 2020-11-09 MED ORDER — MIDAZOLAM HCL 2 MG/2ML IJ SOLN
INTRAMUSCULAR | Status: AC
  Filled 2020-11-09: qty 2

## 2020-11-09 MED ORDER — OXYCODONE HCL 5 MG PO TABS
5.0000 mg | ORAL_TABLET | ORAL | Status: DC | PRN
Start: 2020-11-09 — End: 2020-11-11
  Administered 2020-11-11: 5 mg via ORAL
  Filled 2020-11-09 (×2): qty 1

## 2020-11-09 MED ORDER — TRANEXAMIC ACID-NACL 1000-0.7 MG/100ML-% IV SOLN
1000.0000 mg | INTRAVENOUS | Status: AC
  Administered 2020-11-09: 1000 mg via INTRAVENOUS
  Filled 2020-11-09: qty 100

## 2020-11-09 MED ORDER — DEXAMETHASONE SODIUM PHOSPHATE 10 MG/ML IJ SOLN
INTRAMUSCULAR | Status: DC | PRN
  Administered 2020-11-09: 5 mg via INTRAVENOUS

## 2020-11-09 MED ORDER — LIDOCAINE 2% (20 MG/ML) 5 ML SYRINGE
INTRAMUSCULAR | Status: DC | PRN
  Administered 2020-11-09: 60 mg via INTRAVENOUS

## 2020-11-09 MED ORDER — PHENYLEPHRINE HCL (PRESSORS) 10 MG/ML IV SOLN
INTRAVENOUS | Status: AC
  Filled 2020-11-09: qty 2

## 2020-11-09 MED ORDER — LUBIPROSTONE 24 MCG PO CAPS
24.0000 ug | ORAL_CAPSULE | Freq: Two times a day (BID) | ORAL | Status: DC
  Administered 2020-11-10 – 2020-11-11 (×3): 24 ug via ORAL
  Filled 2020-11-09 (×4): qty 1

## 2020-11-09 MED ORDER — CYCLOBENZAPRINE HCL 10 MG PO TABS
10.0000 mg | ORAL_TABLET | Freq: Three times a day (TID) | ORAL | Status: DC | PRN
  Administered 2020-11-09 – 2020-11-11 (×5): 10 mg via ORAL
  Filled 2020-11-09 (×5): qty 1

## 2020-11-09 MED ORDER — AMISULPRIDE (ANTIEMETIC) 5 MG/2ML IV SOLN: 10.0000 mg | Freq: Once | INTRAVENOUS | Status: DC | PRN

## 2020-11-09 MED ORDER — TRANEXAMIC ACID-NACL 1000-0.7 MG/100ML-% IV SOLN
1000.0000 mg | Freq: Once | INTRAVENOUS | Status: AC
  Administered 2020-11-09: 1000 mg via INTRAVENOUS
  Filled 2020-11-09: qty 100

## 2020-11-09 MED ORDER — BUPIVACAINE LIPOSOME 1.3 % IJ SUSP
INTRAMUSCULAR | Status: AC
  Filled 2020-11-09: qty 20

## 2020-11-09 MED ORDER — PREGABALIN 100 MG PO CAPS
200.0000 mg | ORAL_CAPSULE | Freq: Once | ORAL | Status: DC
  Filled 2020-11-09: qty 2

## 2020-11-09 MED ORDER — ALBUMIN HUMAN 5 % IV SOLN: INTRAVENOUS | Status: DC | PRN

## 2020-11-09 MED ORDER — PANTOPRAZOLE SODIUM 40 MG PO TBEC
80.0000 mg | DELAYED_RELEASE_TABLET | Freq: Every day | ORAL | Status: DC
  Administered 2020-11-10 – 2020-11-11 (×2): 80 mg via ORAL
  Filled 2020-11-09: qty 2

## 2020-11-09 MED ORDER — MIDAZOLAM HCL 5 MG/5ML IJ SOLN
INTRAMUSCULAR | Status: DC | PRN
Start: 2020-11-09 — End: 2020-11-09
  Administered 2020-11-09: .5 mg via INTRAVENOUS

## 2020-11-09 MED ORDER — ACETAMINOPHEN 500 MG PO TABS
1000.0000 mg | ORAL_TABLET | Freq: Four times a day (QID) | ORAL | Status: AC
  Administered 2020-11-09 – 2020-11-10 (×4): 1000 mg via ORAL
  Filled 2020-11-09 (×4): qty 2

## 2020-11-09 MED ORDER — FLEET ENEMA 7-19 GM/118ML RE ENEM: 1.0000 | ENEMA | Freq: Once | RECTAL | Status: DC | PRN

## 2020-11-09 MED ORDER — TRANEXAMIC ACID 1000 MG/10ML IV SOLN
INTRAVENOUS | Status: DC | PRN
  Administered 2020-11-09: 2000 mg via TOPICAL

## 2020-11-09 MED ORDER — EPHEDRINE SULFATE-NACL 50-0.9 MG/10ML-% IV SOSY
PREFILLED_SYRINGE | INTRAVENOUS | Status: DC | PRN
  Administered 2020-11-09 (×2): 10 mg via INTRAVENOUS
  Administered 2020-11-09: 5 mg via INTRAVENOUS

## 2020-11-09 MED ORDER — FENTANYL CITRATE PF 50 MCG/ML IJ SOSY
50.0000 ug | PREFILLED_SYRINGE | Freq: Once | INTRAMUSCULAR | Status: AC
  Administered 2020-11-09: 50 ug via INTRAVENOUS
  Filled 2020-11-09: qty 2

## 2020-11-09 MED ORDER — PHENYLEPHRINE HCL-NACL 20-0.9 MG/250ML-% IV SOLN
INTRAVENOUS | Status: DC | PRN
  Administered 2020-11-09: 50 ug/min via INTRAVENOUS
  Administered 2020-11-09: 40 ug/min via INTRAVENOUS

## 2020-11-09 MED ORDER — CEFAZOLIN SODIUM-DEXTROSE 2-4 GM/100ML-% IV SOLN
2.0000 g | INTRAVENOUS | Status: AC
  Administered 2020-11-09: 2 g via INTRAVENOUS
  Filled 2020-11-09: qty 100

## 2020-11-09 MED ORDER — LACTATED RINGERS IV SOLN: INTRAVENOUS | Status: DC

## 2020-11-09 MED ORDER — INSULIN ASPART 100 UNIT/ML IJ SOLN: 0.0000 [IU] | Freq: Every day | INTRAMUSCULAR | Status: DC

## 2020-11-09 MED ORDER — SODIUM CHLORIDE 0.9% FLUSH
INTRAVENOUS | Status: DC | PRN
  Administered 2020-11-09: 50 mL via INTRAVENOUS

## 2020-11-09 MED ORDER — OXYCODONE HCL 5 MG PO TABS
20.0000 mg | ORAL_TABLET | Freq: Four times a day (QID) | ORAL | Status: DC
  Administered 2020-11-09 – 2020-11-10 (×3): 20 mg via ORAL
  Filled 2020-11-09 (×3): qty 4

## 2020-11-09 MED ORDER — PREGABALIN 75 MG PO CAPS
225.0000 mg | ORAL_CAPSULE | Freq: Two times a day (BID) | ORAL | Status: DC
  Administered 2020-11-09 – 2020-11-11 (×4): 225 mg via ORAL
  Filled 2020-11-09 (×4): qty 3

## 2020-11-09 MED ORDER — ONDANSETRON HCL 4 MG/2ML IJ SOLN: 4.0000 mg | Freq: Four times a day (QID) | INTRAMUSCULAR | Status: DC | PRN

## 2020-11-09 MED ORDER — SORBITOL 70 % SOLN
30.0000 mL | Freq: Every day | Status: DC | PRN
  Filled 2020-11-09: qty 30

## 2020-11-09 MED ORDER — HYDROMORPHONE HCL 1 MG/ML IJ SOLN
0.5000 mg | INTRAMUSCULAR | Status: DC | PRN
  Administered 2020-11-09 – 2020-11-11 (×12): 1 mg via INTRAVENOUS
  Filled 2020-11-09 (×12): qty 1

## 2020-11-09 MED ORDER — HYDROMORPHONE HCL 1 MG/ML IJ SOLN
0.5000 mg | INTRAMUSCULAR | Status: DC | PRN
  Administered 2020-11-09: 1 mg via INTRAVENOUS
  Filled 2020-11-09: qty 1

## 2020-11-09 MED ORDER — SODIUM CHLORIDE 0.9 % IR SOLN
Status: DC | PRN
  Administered 2020-11-09: 1000 mL

## 2020-11-09 MED ORDER — INSULIN ASPART 100 UNIT/ML IJ SOLN
0.0000 [IU] | Freq: Three times a day (TID) | INTRAMUSCULAR | Status: DC
  Administered 2020-11-10: 3 [IU] via SUBCUTANEOUS

## 2020-11-09 MED ORDER — DOCUSATE SODIUM 100 MG PO CAPS
100.0000 mg | ORAL_CAPSULE | Freq: Two times a day (BID) | ORAL | Status: DC
  Administered 2020-11-09 – 2020-11-11 (×4): 100 mg via ORAL
  Filled 2020-11-09 (×4): qty 1

## 2020-11-09 MED ORDER — CHLORHEXIDINE GLUCONATE 0.12 % MT SOLN
15.0000 mL | Freq: Once | OROMUCOSAL | Status: AC
  Administered 2020-11-09: 15 mL via OROMUCOSAL

## 2020-11-09 MED ORDER — CARVEDILOL 12.5 MG PO TABS
12.5000 mg | ORAL_TABLET | Freq: Two times a day (BID) | ORAL | Status: DC
  Administered 2020-11-09 – 2020-11-11 (×4): 12.5 mg via ORAL
  Filled 2020-11-09 (×4): qty 1

## 2020-11-09 MED ORDER — ASPIRIN 81 MG PO CHEW
81.0000 mg | CHEWABLE_TABLET | Freq: Two times a day (BID) | ORAL | Status: DC
  Administered 2020-11-09 – 2020-11-11 (×4): 81 mg via ORAL
  Filled 2020-11-09 (×4): qty 1

## 2020-11-09 MED ORDER — BUPIVACAINE LIPOSOME 1.3 % IJ SUSP: 20.0000 mL | Freq: Once | INTRAMUSCULAR | Status: DC

## 2020-11-09 MED ORDER — ACETAMINOPHEN 500 MG PO TABS
1000.0000 mg | ORAL_TABLET | Freq: Once | ORAL | Status: AC
  Administered 2020-11-09: 1000 mg via ORAL
  Filled 2020-11-09: qty 2

## 2020-11-09 MED ORDER — ONDANSETRON HCL 4 MG/2ML IJ SOLN
INTRAMUSCULAR | Status: AC
  Filled 2020-11-09: qty 2

## 2020-11-09 MED ORDER — FENTANYL CITRATE (PF) 100 MCG/2ML IJ SOLN
INTRAMUSCULAR | Status: AC
  Filled 2020-11-09: qty 2

## 2020-11-09 MED ORDER — BUPIVACAINE LIPOSOME 1.3 % IJ SUSP
INTRAMUSCULAR | Status: DC | PRN
  Administered 2020-11-09: 20 mL

## 2020-11-09 MED ORDER — PHENOL 1.4 % MT LIQD
1.0000 | OROMUCOSAL | Status: DC | PRN
  Filled 2020-11-09: qty 177

## 2020-11-09 MED ORDER — METOCLOPRAMIDE HCL 5 MG/ML IJ SOLN: 5.0000 mg | Freq: Three times a day (TID) | INTRAMUSCULAR | Status: DC | PRN

## 2020-11-09 MED ORDER — LACTATED RINGERS IV BOLUS: 250.0000 mL | Freq: Once | INTRAVENOUS | Status: DC

## 2020-11-09 MED ORDER — INSULIN ASPART 100 UNIT/ML IJ SOLN
4.0000 [IU] | Freq: Three times a day (TID) | INTRAMUSCULAR | Status: DC
  Administered 2020-11-10: 4 [IU] via SUBCUTANEOUS

## 2020-11-09 MED ORDER — CEFAZOLIN SODIUM-DEXTROSE 1-4 GM/50ML-% IV SOLN
1.0000 g | Freq: Four times a day (QID) | INTRAVENOUS | Status: AC
Start: 2020-11-09 — End: 2020-11-09
  Administered 2020-11-09 (×2): 1 g via INTRAVENOUS
  Filled 2020-11-09 (×2): qty 50

## 2020-11-09 MED ORDER — POLYETHYLENE GLYCOL 3350 17 G PO PACK: 17.0000 g | PACK | Freq: Every day | ORAL | Status: DC | PRN

## 2020-11-09 MED ORDER — PHENYLEPHRINE 40 MCG/ML (10ML) SYRINGE FOR IV PUSH (FOR BLOOD PRESSURE SUPPORT)
PREFILLED_SYRINGE | INTRAVENOUS | Status: DC | PRN
  Administered 2020-11-09: 120 ug via INTRAVENOUS
  Administered 2020-11-09: 40 ug via INTRAVENOUS
  Administered 2020-11-09: 80 ug via INTRAVENOUS

## 2020-11-09 MED ORDER — ACETAMINOPHEN 325 MG PO TABS: 325.0000 mg | ORAL_TABLET | Freq: Four times a day (QID) | ORAL | Status: DC | PRN

## 2020-11-09 MED ORDER — WATER FOR IRRIGATION, STERILE IR SOLN
Status: DC | PRN
  Administered 2020-11-09: 2000 mL

## 2020-11-09 MED ORDER — FENTANYL CITRATE PF 50 MCG/ML IJ SOSY: 25.0000 ug | PREFILLED_SYRINGE | INTRAMUSCULAR | Status: DC | PRN

## 2020-11-09 MED ORDER — ONDANSETRON HCL 4 MG PO TABS: 4.0000 mg | ORAL_TABLET | Freq: Four times a day (QID) | ORAL | Status: DC | PRN

## 2020-11-09 MED ORDER — CELECOXIB 200 MG PO CAPS
200.0000 mg | ORAL_CAPSULE | Freq: Once | ORAL | Status: AC
  Administered 2020-11-09: 200 mg via ORAL
  Filled 2020-11-09: qty 1

## 2020-11-09 MED ORDER — POTASSIUM CHLORIDE 20 MEQ PO PACK
20.0000 meq | PACK | Freq: Every day | ORAL | Status: DC
  Administered 2020-11-10 – 2020-11-11 (×2): 20 meq via ORAL
  Filled 2020-11-09 (×2): qty 1

## 2020-11-09 MED ORDER — OXYCODONE HCL 5 MG PO TABS: 20.0000 mg | ORAL_TABLET | Freq: Four times a day (QID) | ORAL | Status: DC | PRN

## 2020-11-09 MED ORDER — KETAMINE HCL 10 MG/ML IJ SOLN
INTRAMUSCULAR | Status: DC | PRN
  Administered 2020-11-09: 30 mg via INTRAVENOUS
  Administered 2020-11-09: 20 mg via INTRAVENOUS

## 2020-11-09 MED ORDER — 0.9 % SODIUM CHLORIDE (POUR BTL) OPTIME
TOPICAL | Status: DC | PRN
  Administered 2020-11-09: 1000 mL

## 2020-11-09 MED ORDER — METOCLOPRAMIDE HCL 5 MG PO TABS: 5.0000 mg | ORAL_TABLET | Freq: Three times a day (TID) | ORAL | Status: DC | PRN

## 2020-11-09 MED ORDER — BUPIVACAINE-EPINEPHRINE (PF) 0.25% -1:200000 IJ SOLN
INTRAMUSCULAR | Status: DC | PRN
  Administered 2020-11-09: 30 mL

## 2020-11-09 MED ORDER — MENTHOL 3 MG MT LOZG: 1.0000 | LOZENGE | OROMUCOSAL | Status: DC | PRN

## 2020-11-09 MED ORDER — PROPOFOL 10 MG/ML IV BOLUS
INTRAVENOUS | Status: AC
  Filled 2020-11-09: qty 20

## 2020-11-09 MED ORDER — ASPIRIN EC 81 MG PO TBEC: 81.0000 mg | DELAYED_RELEASE_TABLET | Freq: Two times a day (BID) | ORAL | 0 refills | Status: AC

## 2020-11-09 MED ORDER — LACTATED RINGERS IV BOLUS: 500.0000 mL | Freq: Once | INTRAVENOUS | Status: DC

## 2020-11-09 MED ORDER — PROPOFOL 10 MG/ML IV BOLUS
INTRAVENOUS | Status: DC | PRN
  Administered 2020-11-09: 150 mg via INTRAVENOUS

## 2020-11-09 MED ORDER — KETAMINE HCL 10 MG/ML IJ SOLN
INTRAMUSCULAR | Status: AC
  Filled 2020-11-09: qty 1

## 2020-11-09 MED ORDER — ALBUMIN HUMAN 5 % IV SOLN
INTRAVENOUS | Status: AC
  Filled 2020-11-09: qty 250

## 2020-11-09 MED ORDER — ATORVASTATIN CALCIUM 40 MG PO TABS
80.0000 mg | ORAL_TABLET | Freq: Every day | ORAL | Status: DC
  Administered 2020-11-10 – 2020-11-11 (×2): 80 mg via ORAL
  Filled 2020-11-09 (×2): qty 2

## 2020-11-09 MED ORDER — MIDAZOLAM HCL 2 MG/2ML IJ SOLN
1.0000 mg | Freq: Once | INTRAMUSCULAR | Status: AC
  Administered 2020-11-09: 1 mg via INTRAVENOUS
  Filled 2020-11-09: qty 2

## 2020-11-09 MED ORDER — OXYCODONE HCL 5 MG PO TABS
ORAL_TABLET | ORAL | Status: AC
  Filled 2020-11-09: qty 1

## 2020-11-09 MED ORDER — SUCCINYLCHOLINE CHLORIDE 200 MG/10ML IV SOSY
PREFILLED_SYRINGE | INTRAVENOUS | Status: DC | PRN
  Administered 2020-11-09: 100 mg via INTRAVENOUS

## 2020-11-09 MED ORDER — CHLORTHALIDONE 25 MG PO TABS
25.0000 mg | ORAL_TABLET | Freq: Every day | ORAL | Status: DC
  Administered 2020-11-10 – 2020-11-11 (×2): 25 mg via ORAL
  Filled 2020-11-09 (×2): qty 1

## 2020-11-09 MED ORDER — ROPIVACAINE HCL 5 MG/ML IJ SOLN
INTRAMUSCULAR | Status: DC | PRN
  Administered 2020-11-09: 20 mL via PERINEURAL

## 2020-11-09 MED ORDER — ORAL CARE MOUTH RINSE: 15.0000 mL | Freq: Once | OROMUCOSAL | Status: AC

## 2020-11-09 SURGICAL SUPPLY — 71 items
APL SKNCLS STERI-STRIP NONHPOA (GAUZE/BANDAGES/DRESSINGS) ×1
ATTUNE MED DOME PAT 38 KNEE (Knees) ×1 IMPLANT
ATTUNE PS FEM RT SZ 7 CEM KNEE (Femur) ×1 IMPLANT
ATTUNE PSRP INSR SZ7 6 KNEE (Insert) ×1 IMPLANT
BAG COUNTER SPONGE SURGICOUNT (BAG) ×2 IMPLANT
BAG DECANTER FOR FLEXI CONT (MISCELLANEOUS) ×3 IMPLANT
BAG SPEC THK2 15X12 ZIP CLS (MISCELLANEOUS) ×1
BAG SPNG CNTER NS LX DISP (BAG) ×1
BAG ZIPLOCK 12X15 (MISCELLANEOUS) ×2 IMPLANT
BASE TIBIAL ROT PLAT SZ 7 KNEE (Knees) IMPLANT
BENZOIN TINCTURE PRP APPL 2/3 (GAUZE/BANDAGES/DRESSINGS) ×1 IMPLANT
BLADE SAGITTAL 25.0X1.19X90 (BLADE) ×2 IMPLANT
BLADE SAW SGTL 13X75X1.27 (BLADE) ×2 IMPLANT
BLADE SURG 15 STRL LF DISP TIS (BLADE) ×1 IMPLANT
BLADE SURG 15 STRL SS (BLADE) ×2
BLADE SURG SZ10 CARB STEEL (BLADE) ×7 IMPLANT
BNDG CMPR MED 15X6 ELC VLCR LF (GAUZE/BANDAGES/DRESSINGS) ×1
BNDG ELASTIC 6X15 VLCR STRL LF (GAUZE/BANDAGES/DRESSINGS) ×2 IMPLANT
BOWL SMART MIX CTS (DISPOSABLE) ×2 IMPLANT
BSPLAT TIB 7 CMNT ROT PLAT STR (Knees) ×1 IMPLANT
CEMENT HV SMART SET (Cement) ×4 IMPLANT
CLSR STERI-STRIP ANTIMIC 1/2X4 (GAUZE/BANDAGES/DRESSINGS) ×3 IMPLANT
COVER SURGICAL LIGHT HANDLE (MISCELLANEOUS) ×2 IMPLANT
CUFF TOURN SGL QUICK 34 (TOURNIQUET CUFF)
CUFF TOURN SGL QUICK 42 (TOURNIQUET CUFF) ×1 IMPLANT
CUFF TRNQT CYL 34X4.125X (TOURNIQUET CUFF) ×1 IMPLANT
DECANTER SPIKE VIAL GLASS SM (MISCELLANEOUS) ×4 IMPLANT
DRAPE INCISE IOBAN 66X45 STRL (DRAPES) ×5 IMPLANT
DRAPE ORTHO SPLIT 77X108 STRL (DRAPES) ×4
DRAPE SURG ORHT 6 SPLT 77X108 (DRAPES) ×2 IMPLANT
DRAPE U-SHAPE 47X51 STRL (DRAPES) ×2 IMPLANT
DRESSING AQUACEL AG SP 3.5X10 (GAUZE/BANDAGES/DRESSINGS) ×1 IMPLANT
DRSG AQUACEL AG SP 3.5X10 (GAUZE/BANDAGES/DRESSINGS) ×2
DURAPREP 26ML APPLICATOR (WOUND CARE) ×5 IMPLANT
ELECT REM PT RETURN 15FT ADLT (MISCELLANEOUS) ×2 IMPLANT
GLOVE SRG 8 PF TXTR STRL LF DI (GLOVE) ×2 IMPLANT
GLOVE SURG ENC MOIS LTX SZ7 (GLOVE) ×2 IMPLANT
GLOVE SURG ORTHO LTX SZ8 (GLOVE) ×2 IMPLANT
GLOVE SURG POLY ORTHO LF SZ7.5 (GLOVE) ×3 IMPLANT
GLOVE SURG POLYISO LF SZ7.5 (GLOVE) ×2 IMPLANT
GLOVE SURG UNDER POLY LF SZ7 (GLOVE) ×2 IMPLANT
GLOVE SURG UNDER POLY LF SZ8 (GLOVE) ×4
GOWN STRL REUS W/TWL LRG LVL3 (GOWN DISPOSABLE) ×2 IMPLANT
GOWN STRL REUS W/TWL XL LVL3 (GOWN DISPOSABLE) ×8 IMPLANT
HANDPIECE INTERPULSE COAX TIP (DISPOSABLE) ×2
HOLDER FOLEY CATH W/STRAP (MISCELLANEOUS) IMPLANT
HOOD PEEL AWAY FLYTE STAYCOOL (MISCELLANEOUS) ×3 IMPLANT
IMMOBILIZER KNEE 20 (SOFTGOODS) ×2
IMMOBILIZER KNEE 20 THIGH 36 (SOFTGOODS) ×1 IMPLANT
KIT TURNOVER KIT A (KITS) ×2 IMPLANT
MANIFOLD NEPTUNE II (INSTRUMENTS) ×2 IMPLANT
NEEDLE HYPO 22GX1.5 SAFETY (NEEDLE) ×4 IMPLANT
NS IRRIG 1000ML POUR BTL (IV SOLUTION) ×2 IMPLANT
PACK ICE MAXI GEL EZY WRAP (MISCELLANEOUS) ×2 IMPLANT
PACK TOTAL KNEE CUSTOM (KITS) ×2 IMPLANT
PIN DRILL FIX HALF THREAD (BIT) ×1 IMPLANT
PIN STEINMAN FIXATION KNEE (PIN) ×1 IMPLANT
PROTECTOR NERVE ULNAR (MISCELLANEOUS) ×2 IMPLANT
SET HNDPC FAN SPRY TIP SCT (DISPOSABLE) ×1 IMPLANT
SLEEVE SURGEON STRL (DRAPES) ×1 IMPLANT
SUT ETHIBOND NAB CT1 #1 30IN (SUTURE) ×4 IMPLANT
SUT MNCRL AB 3-0 PS2 18 (SUTURE) ×2 IMPLANT
SUT VIC AB 0 CT1 36 (SUTURE) ×2 IMPLANT
SUT VIC AB 2-0 CT1 27 (SUTURE) ×4
SUT VIC AB 2-0 CT1 TAPERPNT 27 (SUTURE) ×2 IMPLANT
SYR CONTROL 10ML LL (SYRINGE) ×5 IMPLANT
TIBIAL BASE ROT PLAT SZ 7 KNEE (Knees) ×2 IMPLANT
TOWEL OR 17X26 10 PK STRL BLUE (TOWEL DISPOSABLE) ×2 IMPLANT
TRAY FOLEY MTR SLVR 16FR STAT (SET/KITS/TRAYS/PACK) ×2 IMPLANT
WATER STERILE IRR 1000ML POUR (IV SOLUTION) ×4 IMPLANT
WRAP KNEE MAXI GEL POST OP (GAUZE/BANDAGES/DRESSINGS) ×1 IMPLANT

## 2020-11-09 NOTE — Anesthesia Procedure Notes (Signed)
Procedure Name: Intubation Date/Time: 11/09/2020 11:05 AM Performed by: West Pugh, CRNA Pre-anesthesia Checklist: Patient identified, Emergency Drugs available, Suction available, Patient being monitored and Timeout performed Patient Re-evaluated:Patient Re-evaluated prior to induction Oxygen Delivery Method: Circle system utilized Preoxygenation: Pre-oxygenation with 100% oxygen Induction Type: IV induction Ventilation: Two handed mask ventilation required and Oral airway inserted - appropriate to patient size Laryngoscope Size: Mac and 4 Grade View: Grade II Tube type: Oral Tube size: 7.5 mm Number of attempts: 1 Airway Equipment and Method: Stylet Placement Confirmation: ETT inserted through vocal cords under direct vision, positive ETCO2, CO2 detector and breath sounds checked- equal and bilateral Secured at: 22 cm Tube secured with: Tape Dental Injury: Teeth and Oropharynx as per pre-operative assessment  Comments: Attempted to placed proseal LMA #5. Unable to ventilate. Regular LMA #4 placed with same results. 7.5 ETT placed with grade II view, large tongue and large epiglottis. Masked throughout with inhalation agent on.

## 2020-11-09 NOTE — Progress Notes (Signed)
Orthopedic Tech Progress Note Patient Details:  Thomas Mcclure [birth year] [id number]  CPM Right Knee CPM Right Knee: On Right Knee Flexion (Degrees): 90 Right Knee Extension (Degrees): 0 Additional Comments: bone foam  Post Interventions Patient Tolerated: Well Instructions Provided: Care of device, Adjustment of device  [place] Pink 11/09/2020, 1:19 PM

## 2020-11-09 NOTE — Discharge Instructions (Signed)

## 2020-11-09 NOTE — Interval H&P Note (Signed)
History and Physical Interval Note:  11/09/2020 9:27 AM  Thomas Mcclure  has presented today for surgery, with the diagnosis of OA RIGHT KNEE.  The various methods of treatment have been discussed with the patient and family. After consideration of risks, benefits and other options for treatment, the patient has consented to  Procedure(s): TOTAL KNEE ARTHROPLASTY (Right) as a surgical intervention.  The patient's history has been reviewed, patient examined, no change in status, stable for surgery.  I have reviewed the patient's chart and labs.  Questions were answered to the patient's satisfaction.     Yvette Rack

## 2020-11-09 NOTE — Op Note (Signed)
[name], Thomas Mcclure ACCOUNT NO: [id number] DATE OF BIRTH: 1955-11-29 FACILITY[facility] LOCATION: [id number] PHYSICIAN: W D. Valeta Harms., MD  Operative Report   DATE OF PROCEDURE: 11/09/2020  PREOPERATIVE DIAGNOSIS:  Severe osteoarthritis with flexion deformity and varus deformity, right knee.  POSTOPERATIVE DIAGNOSIS:  Severe osteoarthritis with flexion deformity and varus deformity, right knee.  PROCEDURE:  Right total knee replacement (Attune cemented knee, size 7 femur, size 7 tibia, 6 mm bearing with 38 mm all poly patella.  SURGEON:  Yvette Rack., MD  ASSISTANTMarjo Bicker.  TOURNIQUET TIME:  65 minutes.  ANESTHESIA:  General with adductor block.  DESCRIPTION OF PROCEDURE: Supine position.  Inflation of the thigh tourniquet, exsanguination of the leg to 350.  Straight skin incision with a medial parapatellar approach to the knee made.  There was a significant varus deformity and flexion  contracture in this patient.  We did a 9 mm distal femoral cut with 5 degree of valgus, followed by cutting 3-4 mm below the most diseased medial compartment.  Extension gap was eventually measured at 6 mm.  Placed all-in-1 cutting block for the femoral  after sizing the femur to be a size 7, accomplishing the anterior, posterior and chamfer cuts.  Tibia was sized to be a size 7, placement of the keel cut on the tibial baseplate with the tibial trial.  Box cut was made on the femur followed by placement  of the femoral trial.  The patella was cut, resecting 9.5 mm of native patella.  We placed a 38 mm trial button on the patella.  The patient had resolution of his varus deformity and flexion contracture with full extension, excellent tracking was noted  as well and good stability of the bearing.  Final components were inserted, tibia followed by femoral, patella with cement in the doughy state. Prior to this, we infiltrated the knee and soft tissues with a mixture of  Exparel and Marcaine with the  addition of topical TXA.  Final components were inserted with a trial bearing.  The cement was allowed to harden.  The trial bearing was removed.  Portions of excess cement were removed from the posterior aspect of the knee.  Tourniquet was released  under direct vision prior to placement of the final bearing.  No excessive bleeding was noted.  Small bleeders were coagulated.  The final bearing was placed.  Closure was affected on the capsule with #1 Ethibond, 2-0 Vicryl, and Monocryl in the skin.   SHW D: 11/09/2020 12:44:02 pm T: 11/09/2020 10:28:00 pm  JOB: [id number]

## 2020-11-09 NOTE — Plan of Care (Signed)
  Problem: Education: Goal: Knowledge of General Education information will improve Description: Including pain rating scale, medication(s)/side effects and non-pharmacologic comfort measures Outcome: Progressing   Problem: Health Behavior/Discharge Planning: Goal: Ability to manage health-related needs will improve Outcome: Progressing   Problem: Clinical Measurements: Goal: Ability to maintain clinical measurements within normal limits will improve Outcome: Progressing Goal: Will remain free from infection Outcome: Progressing Goal: Diagnostic test results will improve Outcome: Progressing Goal: Respiratory complications will improve Outcome: Progressing Goal: Cardiovascular complication will be avoided Outcome: Progressing   Problem: Activity: Goal: Risk for activity intolerance will decrease Outcome: Progressing   Problem: Pain Managment: Goal: General experience of comfort will improve Outcome: Progressing   Problem: Safety: Goal: Ability to remain free from injury will improve Outcome: Progressing   Problem: Skin Integrity: Goal: Risk for impaired skin integrity will decrease Outcome: Progressing   Problem: Education: Goal: Knowledge of the prescribed therapeutic regimen will improve Outcome: Progressing Goal: Individualized Educational Video(s) Outcome: Progressing   Problem: Activity: Goal: Ability to avoid complications of mobility impairment will improve Outcome: Progressing Goal: Range of joint motion will improve Outcome: Progressing   Problem: Clinical Measurements: Goal: Postoperative complications will be avoided or minimized Outcome: Progressing   Problem: Pain Management: Goal: Pain level will decrease with appropriate interventions Outcome: Progressing   Problem: Skin Integrity: Goal: Will show signs of wound healing Outcome: Progressing

## 2020-11-09 NOTE — Anesthesia Procedure Notes (Signed)
Anesthesia Regional Block: Adductor canal block   Pre-Anesthetic Checklist: , timeout performed,  Correct Patient, Correct Site, Correct Laterality,  Correct Procedure, Correct Position, site marked,  Risks and benefits discussed,  Surgical consent,  Pre-op evaluation,  At surgeon's request and post-op pain management  Laterality: Right  Prep: chloraprep       Needles:  Injection technique: Single-shot  Needle Type: Echogenic Needle     Needle Length: 9cm  Needle Gauge: 21     Additional Needles:   Procedures:,,,, ultrasound used (permanent image in chart),,    Narrative:  Start time: 11/09/2020 9:22 AM End time: 11/09/2020 9:27 AM Injection made incrementally with aspirations every 5 mL.  Performed by: Personally  Anesthesiologist: Suzette Battiest, MD

## 2020-11-09 NOTE — Transfer of Care (Signed)
Immediate Anesthesia Transfer of Care Note  Patient: Thomas Mcclure  Procedure(s) Performed: TOTAL KNEE ARTHROPLASTY (Right: Knee)  Patient Location: PACU  Anesthesia Type:GA combined with regional for post-op pain  Level of Consciousness: awake and responds to stimulation  Airway & Oxygen Therapy: Patient Spontanous Breathing and Patient connected to face mask oxygen  Post-op Assessment: Report given to RN and Post -op Vital signs reviewed and stable  Post vital signs: Reviewed and stable  Last Vitals:  Vitals Value Taken Time  BP 100/66 11/09/20 1330  Temp 36.6 C 11/09/20 1325  Pulse 84 11/09/20 1342  Resp 13 11/09/20 1342  SpO2 99 % 11/09/20 1342  Vitals shown include unvalidated device data.  Last Pain:  Vitals:   11/09/20 0928  TempSrc:   PainSc: 0-No pain         Complications: No notable events documented.

## 2020-11-09 NOTE — Interval H&P Note (Signed)
History and Physical Interval Note:  11/09/2020 9:28 AM  Thomas Mcclure  has presented today for surgery, with the diagnosis of OA RIGHT KNEE.  The various methods of treatment have been discussed with the patient and family. After consideration of risks, benefits and other options for treatment, the patient has consented to  Procedure(s): TOTAL KNEE ARTHROPLASTY (Right) as a surgical intervention.  The patient's history has been reviewed, patient examined, no change in status, stable for surgery.  I have reviewed the patient's chart and labs.  Questions were answered to the patient's satisfaction.     Yvette Rack

## 2020-11-09 NOTE — Progress Notes (Signed)
AssistedDr. Rob Fitzgerald with right, ultrasound guided, adductor canal block. Side rails up, monitors on throughout procedure. See vital signs in flow sheet. Tolerated Procedure well.  

## 2020-11-09 NOTE — Progress Notes (Signed)
PT Cancellation Note  Patient Details Name: Thomas Mcclure MRN: [id number] DOB: [birth year]   Cancelled Treatment:     PT eval attempted but pt refused stating "I'm not doing no therapy now.  I just had a pain shoot through me and I haven't had pain medicine."  RN aware.  Pt received Dilaudid by IV and Flexeril at 1659 and Oxycodone by mouth at 1535.  Eval attempted at 1813.  Will follow in am.   Marius Betts 11/09/2020, 6:29 PM

## 2020-11-10 ENCOUNTER — Encounter (HOSPITAL_COMMUNITY): Payer: Self-pay | Admitting: Orthopedic Surgery

## 2020-11-10 LAB — GLUCOSE, CAPILLARY
Glucose-Capillary: 172 mg/dL — ABNORMAL HIGH (ref 70–99)
Glucose-Capillary: 104 mg/dL — ABNORMAL HIGH (ref 70–99)
Glucose-Capillary: 155 mg/dL — ABNORMAL HIGH (ref 70–99)
Glucose-Capillary: 206 mg/dL — ABNORMAL HIGH (ref 70–99)

## 2020-11-10 MED ORDER — ACETAMINOPHEN 500 MG PO TABS
1000.0000 mg | ORAL_TABLET | Freq: Three times a day (TID) | ORAL | Status: DC
  Administered 2020-11-10 – 2020-11-11 (×3): 1000 mg via ORAL
  Filled 2020-11-10 (×3): qty 2

## 2020-11-10 MED ORDER — OXYCODONE HCL 5 MG PO TABS
20.0000 mg | ORAL_TABLET | ORAL | Status: DC
  Administered 2020-11-10 – 2020-11-11 (×7): 20 mg via ORAL
  Filled 2020-11-10 (×7): qty 4

## 2020-11-10 NOTE — Progress Notes (Signed)
   11/10/20 1100  PT Visit Information  Last PT Received On 11/10/20  PT continues to mobilize well and make progress toward goals. After amb and gentle exercises pt bled through the knee dressing again.  RN aware. Continue to follow   Assistance Needed +1  History of Present Illness 65 yo male adm with R TKA. PMH: multiple back surgeries, RA, MI, HTN, CAD, DM  Precautions  Precautions Knee  Restrictions  Weight Bearing Restrictions No  RLE Weight Bearing WBAT  Pain Assessment  Pain Assessment 0-10  Pain Score 4  Pain Location right knee  Pain Descriptors / Indicators Aching;Grimacing;Sore  Pain Intervention(s) Limited activity within patient's tolerance;Monitored during session;Premedicated before session;Repositioned  Cognition  Arousal/Alertness Awake/alert  Behavior During Therapy WFL for tasks assessed/performed  Overall Cognitive Status Within Functional Limits for tasks assessed  Bed Mobility  General bed mobility comments pt in recliner  Transfers  Overall transfer level Needs assistance  Equipment used Rolling walker (2 wheeled)  Transfers Sit to/from Stand  Sit to Stand Supervision  General transfer comment cues for hand placement  Ambulation/Gait  Ambulation/Gait assistance Min guard;Supervision  Gait Distance (Feet) 110 Feet  Assistive device Rolling walker (2 wheeled)  Gait Pattern/deviations Step-through pattern;Decreased stance time - right  General Gait Details cues for sequence and RW position from self as well as trunk extension/upward gaze  Total Joint Exercises  Ankle Circles/Pumps AROM;Both;10 reps  Quad Sets AROM;Both;10 reps  Heel Slides AAROM;Right;5 reps;Limitations  Hip ABduction/ADduction AAROM;AROM;Right;10 reps  Straight Leg Raises AROM;Right;10 reps  Heel Slides Limitations very gentle ROM through limited ROM d/t pt with incr bleeding from incision site this am  PT - End of Session  Equipment Utilized During Treatment Gait belt  Activity  Tolerance Patient tolerated treatment well  Patient left in chair;with call bell/phone within reach;with chair alarm set  Nurse Communication Mobility status   PT - Assessment/Plan  PT Plan Current plan remains appropriate  PT Visit Diagnosis Other abnormalities of gait and mobility (R26.89)  PT Frequency (ACUTE ONLY) 7X/week  Follow Up Recommendations Follow surgeon's recommendation for DC plan and follow-up therapies  PT equipment None recommended by PT  AM-PAC PT "6 Clicks" Mobility Outcome Measure (Version 2)  Help needed turning from your back to your side while in a flat bed without using bedrails? 3  Help needed moving from lying on your back to sitting on the side of a flat bed without using bedrails? 3  Help needed moving to and from a bed to a chair (including a wheelchair)? 3  Help needed standing up from a chair using your arms (e.g., wheelchair or bedside chair)? 3  Help needed to walk in hospital room? 3  Help needed climbing 3-5 steps with a railing?  3  6 Click Score 18  Consider Recommendation of Discharge To: Home with [facility]  PT Goal Progression  Progress towards PT goals Progressing toward goals  Acute Rehab PT Goals  PT Goal Formulation With patient  Time For Goal Achievement 11/17/20  Potential to Achieve Goals Good  PT Time Calculation  PT Start Time (ACUTE ONLY) 1120  PT Stop Time (ACUTE ONLY) 1143  PT Time Calculation (min) (ACUTE ONLY) 23 min  PT General Charges  $$ ACUTE PT VISIT 1 Visit  PT Treatments  $Gait Training 8-22 mins  $Therapeutic Exercise 8-22 mins

## 2020-11-10 NOTE — Progress Notes (Signed)
Rn attempting to Notify on call provider Noemi Chapel concerning pt continued icy feeling in lower operative leg and also that pt bled through his surgical dressing with physical therapy requiring a dressing change to the site. RN awaiting a call back.

## 2020-11-10 NOTE — Progress Notes (Signed)
11/09/20 1905 Patient agreed he would allow Writer to serve his needs after Patient expresd anger at his prior RN and CN.  1918 attempts to medicate Patient with '5mg'$  oxycodone and scheduled night medications failed.  Pyxis indicated 20 mg oxycodone not due.  Patient O2 sat dropped into high 80's when demanding '20mg'$  oxycodone every 4 hours.  Writer informed patient of current doctor orders and intent to notify MD on-call of uncontrolled pain...  Obtained orders from [facility] for Dilaudid frequency to be increased to every 2 hours, Patient then refused Dilaudid.  2038/06/05 Signature obtained for AMA form when patient stated "I'm going home tonight."  [place] educated Patient on consequences of AMA and that staff nurse would not give narcotics prior to him leaving when he was admitted for oxygen desaturation after surgery due to risk of injury/death.  2048-06-04 Supervisor Brayton Caves PA and Dr. Percell Miller and Patient have had multiple conversations with the goal of Patient accepting medication and agreeing to stay in hospital to be monitored and evaluated by PT prior to discharge...  0300 Patient woke requesting more medication.  0600 tolerated standing at the bedside well.  0730 Calm and cooperative with expressed goal of meeting his PT goals and going home. Patient accepted and participated with prayer and allowed Writer to join him in singing a hymn after sharing about mutual faith.

## 2020-11-10 NOTE — Progress Notes (Signed)
   ORTHOPAEDIC PROGRESS NOTE  s/p Procedure(s): TOTAL KNEE ARTHROPLASTY on 11/09/2020 with Dr. French Ana  SUBJECTIVE: Patient's pain was controlled this morning, however has increased throughout the day. He had a bad experience with the staff when he was brought up to the floor from short stay. There were also issues with his medications overnight, but this has been adjusted. He also began to have more drainage from his incision after working with therapy. He is motivated to work with therapy.   OBJECTIVE: PE: General: sitting up in recliner, NAD Cardiac: regular rate Pulmonary: no increased work of breathing RLE: dressing was CDI this morning, a bulky dressing was placed this afternoon due to increased amount of drainage. This was removed. Incision with steri-strips in place and mild drainage. Swelling of the right knee as to be expected, tender to palpation throughout, new bulky dressing placed, compartments soft and compressible, intact EHL/TA/GSC, he endorses distal sensation - but does have peripheral neuropathy from back issues, + DP pulse   Vitals:   11/10/20 1013 11/10/20 1407  BP: 128/77 115/72  Pulse: 100 92  Resp: 18 14  Temp: 98 F (36.7 C) 98.3 F (36.8 C)  SpO2: 94% 92%     ASSESSMENT: Thomas Mcclure is a 65 y.o. male POD#1  PLAN: Weightbearing: WBAT RLE Insicional and dressing care: Reinforce dressings as needed Orthopedic device(s): None Showering: Hold for now VTE prophylaxis: Aspirin BID. SCDs. Ambulation Pain control:  Scheduled Oxycodone 20 mg every 4 hours. Oxycodone 5 mg every 4 hours for breakthrough pain  Dilaudid 0.5-1 mg every 2 hours for severe breakthrough pain Celebrex 200 mg BID Tylenol 1000 mg every 8 hours Flexeril 10 mg 3 times a day as needed for muscle spasms DonJoy IceMan  - use caution with PRN medications as patient is on scheduled pain medication to avoid oversedation  Follow - up plan: In office with Dr. French Ana Dispo: TBD. PT  eval today. Has outpatient therapy set up. Goal is to get pain under control and monitor his incision for bleeding. If pain is better controlled tomorrow and there are no continued wound concerns, then patient may discharge home. But we will continue to monitor  Contact information:  After hours and holidays please check [email or web address] for group call information for [facility]  Noemi Chapel, PA-C 11/10/2020

## 2020-11-10 NOTE — TOC Initial Note (Signed)
Transition of Care [facility]) - Initial/Assessment Note    Patient Details  Name: Thomas Mcclure MRN: [id number] Date of Birth: [birth year]  [facility]) CM/SW Contact:    Joaquin Courts, RN Phone Number: 11/10/2020, 11:27 AM  Clinical Narrative:        Patient plans to discharge home with outpatient PT. Patient has all necessary dme.            Expected Discharge Plan: Home/Self Care Barriers to Discharge: No Barriers Identified   Patient Goals and CMS Choice Patient states their goals for this hospitalization and ongoing recovery are:: to go home      Expected Discharge Plan and Services Expected Discharge Plan: Home/Self Care   Discharge Planning Services: CM Consult   Living arrangements for the past 2 months: Single Family Home Expected Discharge Date: 11/10/20               DME Arranged: N/A DME Agency: NA       HH Arranged: NA          Prior Living Arrangements/Services Living arrangements for the past 2 months: Single Family Home   Patient language and need for interpreter reviewed:: Yes Do you feel safe going back to the place where you live?: Yes            Criminal Activity/Legal Involvement Pertinent to Current Situation/Hospitalization: No - Comment as needed  Activities of Daily Living Home Assistive Devices/Equipment: Stimulators, Cane (specify quad or straight) ADL Screening (condition at time of admission) Patient's cognitive ability adequate to safely complete daily activities?: Yes Is the patient deaf or have difficulty hearing?: No Does the patient have difficulty seeing, even when wearing glasses/contacts?: No Does the patient have difficulty concentrating, remembering, or making decisions?: No Patient able to express need for assistance with ADLs?: Yes Does the patient have difficulty dressing or bathing?: No Independently performs ADLs?: Yes (appropriate for developmental age) Does the patient have difficulty walking or  climbing stairs?: Yes Weakness of Legs: Both Weakness of Arms/Hands: None  Permission Sought/Granted                  Emotional Assessment Appearance:: Appears stated age         Psych Involvement: No (comment)  Admission diagnosis:  Primary localized osteoarthritis of right knee [M17.11] Patient Active Problem List   Diagnosis Date Noted   Primary localized osteoarthritis of right knee 11/09/2020   Chronic low back pain 02/09/2019   Spinal stenosis of lumbar region with neurogenic claudication 11/22/2018   Radiculopathy of lumbar region 05/31/2017   HCAP (healthcare-associated pneumonia) 05/30/2017   Leukocytosis 05/30/2017   Lumbar pseudoarthrosis 05/27/2017   Unstable angina ([place]) 02/18/2017   Primary localized osteoarthritis of right hip 10/03/2016   Chest pain, rule out acute myocardial infarction 05/01/2016   Diabetes mellitus type 2, controlled, with complications ([place]) [id number]   Obesity (BMI 30-39.9) 05/01/2016   Left ventricular aneurysm 12/03/2015   Ischemic cardiomyopathy    CAD (coronary artery disease) 07/16/2015   Hypokalemia 07/16/2015   History of non-ST elevation myocardial infarction (NSTEMI) 07/15/2015   Hypertension 07/15/2015   Hyperlipidemia with target LDL less than 100 07/15/2015   Back pain 07/15/2015   GERD (gastroesophageal reflux disease) 06/18/2012   Chronic radicular low back pain 06/18/2012   PCP:  Fredrich Romans, PA Pharmacy:   [facility] [id number] - [place], [address] AT [address] [place]  [place] [address] Phone: [phone] Fax: [phone]  [address] [address] Phone: [phone] Fax: [phone]     Social Determinants of Health (SDOH) Interventions    Readmission Risk Interventions No flowsheet data found.

## 2020-11-10 NOTE — Plan of Care (Signed)
  Problem: Clinical Measurements: Goal: Respiratory complications will improve Outcome: Progressing   Problem: Clinical Measurements: Goal: Cardiovascular complication will be avoided Outcome: Progressing   Problem: Skin Integrity: Goal: Risk for impaired skin integrity will decrease Outcome: Progressing   Problem: Education: Goal: Knowledge of the prescribed therapeutic regimen will improve Outcome: Progressing

## 2020-11-10 NOTE — Evaluation (Signed)
Physical Therapy Evaluation Patient Details Name: Thomas Mcclure MRN: [id number] DOB: 1955/04/30 Today's Date: 11/10/2020  History of Present Illness  65 yo male adm with R TKA. PMH: multiple back surgeries, RA, MI, HTN, CAD, DM  Clinical Impression  Pt is s/p TKA resulting in the deficits listed below (see PT Problem List).  Will see for a second session and pt will be ready to d/c with family assist  Doing well this am, pain controlled, mobilizing well and motivated to d/c   Pt will benefit from skilled PT to increase their independence and safety with mobility to allow discharge to the venue listed below.         Recommendations for follow up therapy are one component of a multi-disciplinary discharge planning process, led by the attending physician.  Recommendations may be updated based on patient status, additional functional criteria and insurance authorization.  Follow Up Recommendations Follow surgeon's recommendation for DC plan and follow-up therapies    Equipment Recommendations  None recommended by PT    Recommendations for Other Services       Precautions / Restrictions Precautions Precautions: Knee Restrictions Weight Bearing Restrictions: No RLE Weight Bearing: Weight bearing as tolerated      Mobility  Bed Mobility               General bed mobility comments: pt in recliner    Transfers Overall transfer level: Needs assistance Equipment used: Rolling walker (2 wheeled) Transfers: Sit to/from Stand Sit to Stand: Min guard         General transfer comment: cues for hand placement  Ambulation/Gait Ambulation/Gait assistance: Min guard;Supervision Gait Distance (Feet): 190 Feet Assistive device: Rolling walker (2 wheeled) Gait Pattern/deviations: Step-through pattern;Decreased stance time - right        Stairs Stairs: Yes Stairs assistance: Min guard Stair Management: Step to pattern;Sideways Number of Stairs: 3 General stair  comments: cues for sequence and safe technique  Wheelchair Mobility    Modified Rankin (Stroke Patients Only)       Balance                                             Pertinent Vitals/Pain Pain Assessment: 0-10 Pain Location: right knee Pain Descriptors / Indicators: Aching;Grimacing;Sore Pain Intervention(s): Limited activity within patient's tolerance;Monitored during session;Premedicated before session;Repositioned    Home Living Family/patient expects to be discharged to:: Private residence Living Arrangements: Spouse/significant other Available Help at Discharge: Family;Available 24 hours/day   Home Access: Stairs to enter     Home Layout: One level Home Equipment: Cane - single point      Prior Function Level of Independence: Independent with assistive device(s)               Hand Dominance        Extremity/Trunk Assessment   Upper Extremity Assessment Upper Extremity Assessment: Overall WFL for tasks assessed    Lower Extremity Assessment Lower Extremity Assessment: RLE deficits/detail RLE Deficits / Details: ankle WFL, knee extension and hip flexion 2+/5; knee flexion ~ 10 to 55 degrees       Communication   Communication: No difficulties  Cognition Arousal/Alertness: Awake/alert Behavior During Therapy: WFL for tasks assessed/performed Overall Cognitive Status: Within Functional Limits for tasks assessed  General Comments      Exercises Total Joint Exercises Ankle Circles/Pumps: AROM;Both;10 reps   Assessment/Plan    PT Assessment Patient needs continued PT services  PT Problem List Decreased strength;Decreased mobility;Decreased range of motion;Decreased activity tolerance;Pain;Decreased knowledge of use of DME       PT Treatment Interventions Functional mobility training;DME instruction;Gait training;Therapeutic exercise;Therapeutic  activities;Patient/family education;Stair training    PT Goals (Current goals can be found in the Care Plan section)  Acute Rehab PT Goals PT Goal Formulation: With patient Time For Goal Achievement: 11/17/20 Potential to Achieve Goals: Good    Frequency 7X/week   Barriers to discharge        Co-evaluation               AM-PAC PT "6 Clicks" Mobility  Outcome Measure Help needed turning from your back to your side while in a flat bed without using bedrails?: A Little Help needed moving from lying on your back to sitting on the side of a flat bed without using bedrails?: A Little Help needed moving to and from a bed to a chair (including a wheelchair)?: A Little Help needed standing up from a chair using your arms (e.g., wheelchair or bedside chair)?: A Little Help needed to walk in hospital room?: A Little Help needed climbing 3-5 steps with a railing? : A Little 6 Click Score: 18    End of Session Equipment Utilized During Treatment: Gait belt Activity Tolerance: Patient tolerated treatment well Patient left: in chair;with call bell/phone within reach;with chair alarm set Nurse Communication: Mobility status PT Visit Diagnosis: Other abnormalities of gait and mobility (R26.89)    [id number] PT Time Calculation (min) (ACUTE ONLY): 20 min   Charges:   PT Evaluation $PT Eval Low Complexity: [place], PT  Acute Rehab Dept ([place]) [phone] Pager [phone]  11/10/2020   [facility] 11/10/2020, 9:59 AM

## 2020-11-10 NOTE — Plan of Care (Signed)
  Problem: Pain Managment: Goal: General experience of comfort will improve Outcome: Progressing   

## 2020-11-10 NOTE — Progress Notes (Signed)
[place] contacted concerning pt continued bleeding from surgical knee wound. She stated she would come look at it soon. Rn will continue to monitor.

## 2020-11-11 DIAGNOSIS — Z7982 Long term (current) use of aspirin: Secondary | ICD-10-CM | POA: Diagnosis not present

## 2020-11-11 DIAGNOSIS — M1711 Unilateral primary osteoarthritis, right knee: Secondary | ICD-10-CM | POA: Diagnosis present

## 2020-11-11 DIAGNOSIS — I255 Ischemic cardiomyopathy: Secondary | ICD-10-CM | POA: Diagnosis present

## 2020-11-11 DIAGNOSIS — M069 Rheumatoid arthritis, unspecified: Secondary | ICD-10-CM | POA: Diagnosis present

## 2020-11-11 DIAGNOSIS — Z8701 Personal history of pneumonia (recurrent): Secondary | ICD-10-CM | POA: Diagnosis not present

## 2020-11-11 DIAGNOSIS — Z888 Allergy status to other drugs, medicaments and biological substances status: Secondary | ICD-10-CM | POA: Diagnosis not present

## 2020-11-11 DIAGNOSIS — Z7984 Long term (current) use of oral hypoglycemic drugs: Secondary | ICD-10-CM | POA: Diagnosis not present

## 2020-11-11 DIAGNOSIS — I110 Hypertensive heart disease with heart failure: Secondary | ICD-10-CM | POA: Diagnosis present

## 2020-11-11 DIAGNOSIS — E785 Hyperlipidemia, unspecified: Secondary | ICD-10-CM | POA: Diagnosis present

## 2020-11-11 DIAGNOSIS — M25761 Osteophyte, right knee: Secondary | ICD-10-CM | POA: Diagnosis present

## 2020-11-11 DIAGNOSIS — G8929 Other chronic pain: Secondary | ICD-10-CM | POA: Diagnosis present

## 2020-11-11 DIAGNOSIS — Z79899 Other long term (current) drug therapy: Secondary | ICD-10-CM | POA: Diagnosis not present

## 2020-11-11 DIAGNOSIS — M21261 Flexion deformity, right knee: Secondary | ICD-10-CM | POA: Diagnosis present

## 2020-11-11 DIAGNOSIS — K219 Gastro-esophageal reflux disease without esophagitis: Secondary | ICD-10-CM | POA: Diagnosis present

## 2020-11-11 DIAGNOSIS — Z981 Arthrodesis status: Secondary | ICD-10-CM | POA: Diagnosis not present

## 2020-11-11 DIAGNOSIS — E119 Type 2 diabetes mellitus without complications: Secondary | ICD-10-CM | POA: Diagnosis present

## 2020-11-11 DIAGNOSIS — I509 Heart failure, unspecified: Secondary | ICD-10-CM | POA: Diagnosis present

## 2020-11-11 DIAGNOSIS — M21161 Varus deformity, not elsewhere classified, right knee: Secondary | ICD-10-CM | POA: Diagnosis present

## 2020-11-11 DIAGNOSIS — M549 Dorsalgia, unspecified: Secondary | ICD-10-CM | POA: Diagnosis present

## 2020-11-11 DIAGNOSIS — E669 Obesity, unspecified: Secondary | ICD-10-CM | POA: Diagnosis present

## 2020-11-11 DIAGNOSIS — I252 Old myocardial infarction: Secondary | ICD-10-CM | POA: Diagnosis not present

## 2020-11-11 DIAGNOSIS — Z955 Presence of coronary angioplasty implant and graft: Secondary | ICD-10-CM | POA: Diagnosis not present

## 2020-11-11 DIAGNOSIS — I2510 Atherosclerotic heart disease of native coronary artery without angina pectoris: Secondary | ICD-10-CM | POA: Diagnosis present

## 2020-11-11 DIAGNOSIS — Z87891 Personal history of nicotine dependence: Secondary | ICD-10-CM | POA: Diagnosis not present

## 2020-11-11 LAB — GLUCOSE, CAPILLARY
Glucose-Capillary: 134 mg/dL — ABNORMAL HIGH (ref 70–99)
Glucose-Capillary: 168 mg/dL — ABNORMAL HIGH (ref 70–99)

## 2020-11-11 MED ORDER — ACETAMINOPHEN 500 MG PO TABS
1000.0000 mg | ORAL_TABLET | Freq: Three times a day (TID) | ORAL | 0 refills | Status: DC
Start: 2020-11-11 — End: 2021-09-15
  Filled 2020-11-11: qty 30, 5d supply, fill #0

## 2020-11-11 NOTE — Plan of Care (Signed)
Pt stable at this time. Pt to d/c home when rn and family are ready.

## 2020-11-11 NOTE — Progress Notes (Signed)
Pt stable at time of d/c. No needs at time of d/c. Pt dressing clean, dry, and intact.  

## 2020-11-11 NOTE — Progress Notes (Signed)
    Subjective: Patient reports pain as moderate to severe. Worse with movement. Chronic back pain. Tolerating diet. Urinating. No CP, SOB.  Has worked with PT on mobilizing OOB. Sessions have gone ok but he was bleeding through his dressing each time he worked with them.   Objective:   VITALS:   Vitals:   11/10/20 1013 11/10/20 1407 11/10/20 2248 11/11/20 0700  BP: 128/77 115/72 106/72 110/79  Pulse: 100 92 (!) 110 100  Resp: '18 14 17 16  '$ Temp: 98 F (36.7 C) 98.3 F (36.8 C) 98.2 F (36.8 C) 98.8 F (37.1 C)  TempSrc:  Oral Oral Oral  SpO2: 94% 92% 94% 95%  Weight:      Height:       CBC Latest Ref Rng & Units 11/01/2020 04/12/2019 04/07/2019  WBC 4.0 - 10.5 K/uL 7.4 10.1 7.8  Hemoglobin 13.0 - 17.0 g/dL 12.6(L) 9.9(L) 11.2(L)  Hematocrit 39.0 - 52.0 % 40.4 31.8(L) 37.1(L)  Platelets 150 - 400 K/uL 262 288 373   BMP Latest Ref Rng & Units 11/01/2020 04/12/2019 04/07/2019  Glucose 70 - 99 mg/dL 143(H) 148(H) 122(H)  BUN 8 - 23 mg/dL 24(H) 7(L) 14  Creatinine 0.61 - 1.24 mg/dL 1.10 1.02 1.20  BUN/Creat Ratio 10 - 24 - - -  Sodium 135 - 145 mmol/L 137 140 136  Potassium 3.5 - 5.1 mmol/L 3.8 4.0 4.1  Chloride 98 - 111 mmol/L 100 101 98  CO2 22 - 32 mmol/L '25 29 25  '$ Calcium 8.9 - 10.3 mg/dL 9.4 8.9 9.1   Intake/Output      09/17 0701 09/18 0700 09/18 0701 09/19 0700   P.O. 2520 600   I.V. (mL/kg) 0 (0)    IV Piggyback     Total Intake(mL/kg) 2520 (22.1) 600 (5.3)   Urine (mL/kg/hr) 5150 (1.9) 950 (1.3)   Stool 0 0   Total Output 5150 950   Net -2630 -350        Urine Occurrence 5 x 1 x   Stool Occurrence 1 x 0 x      Physical Exam: General: NAD.  Laying in bed. Calm, conversant Resp: No increased wob Cardio: regular rate and rhythm ABD soft Neurologically intact MSK Neurovascularly intact Sensation intact distally Intact pulses distally Dorsiflexion/Plantar flexion intact Extremely TTP RLE Incision: bulky dressing C/D/I so replaced with new  Aquacel   Assessment: 2 Days Post-Op  S/P Procedure(s) (LRB): TOTAL KNEE ARTHROPLASTY (Right) by Dr. French Ana on 11/09/20  Active Problems:   Primary localized osteoarthritis of right knee   Plan: Patient wants to go home.  Advance diet Up with therapy Incentive Spirometry Elevate and Apply ice  Weightbearing: WBAT RLE Insicional and dressing care: Dressings left intact until follow-up and Reinforce dressings as needed Orthopedic device(s):  ice man Showering: Keep dressing dry VTE prophylaxis: Aspirin '81mg'$  BID  x 30 days , SCDs, ambulation Pain control: on Oxy '20mg'$  q6h at home, getting Oxy '20mg'$  q4h in hospital with Oxy '5mg'$  q4h PRN and Dilaudid '1mg'$  q2h PRN Follow - up plan: 2 weeks with Dr. French Ana  Dispo: Home today. Patient reports he feels ready to go home. Unsure how his wife will feel about this but feels confident he can manage himself ok.     Britt Bottom, PA-C Office [phone] 11/11/2020, 1:39 PM

## 2020-11-11 NOTE — Progress Notes (Signed)
Physical Therapy Treatment Patient Details Name: Thomas Mcclure MRN: [id number] DOB: September 17, 1955 Today's Date: 11/11/2020   History of Present Illness 65 yo male adm with R TKA. PMH: multiple back surgeries, RA, MI, HTN, CAD, DM    PT Comments    Pt continues to progress toward goals. Having more pain overall however not requiring incr assistance, amb short distance into hallway, limited by pain. Deferred knee flexion/exercise d/t bleeding yesterday with pressure dressing in place.  We did review stairs yesterday. Will leave d/c up to MD/PA. Pt states that he wants to go home   Recommendations for follow up therapy are one component of a multi-disciplinary discharge planning process, led by the attending physician.  Recommendations may be updated based on patient status, additional functional criteria and insurance authorization.  Follow Up Recommendations  Follow surgeon's recommendation for DC plan and follow-up therapies     Equipment Recommendations  None recommended by PT    Recommendations for Other Services       Precautions / Restrictions Precautions Precautions: Knee     Mobility  Bed Mobility Overal bed mobility: Needs Assistance Bed Mobility: Supine to Sit;Sit to Supine     Supine to sit: Supervision Sit to supine: Supervision   General bed mobility comments: pt is able to use gait belt as leg lifter to bring RLE on and off bed with incr time    Transfers Overall transfer level: Needs assistance Equipment used: Rolling walker (2 wheeled) Transfers: Sit to/from Stand Sit to Stand: Supervision         General transfer comment: cues for hand placement  Ambulation/Gait Ambulation/Gait assistance: Min guard;Supervision Gait Distance (Feet): 25 Feet Assistive device: Rolling walker (2 wheeled) Gait Pattern/deviations: Step-through pattern;Decreased stance time - right     General Gait Details: cues for sequence and RW position from self as well as trunk  extension/upward gaze. pt is not requiring more assist however distance limited by pain   Stairs             Wheelchair Mobility    Modified Rankin (Stroke Patients Only)       Balance                                            Cognition Arousal/Alertness: Awake/alert Behavior During Therapy: WFL for tasks assessed/performed Overall Cognitive Status: Within Functional Limits for tasks assessed                                        Exercises Total Joint Exercises Ankle Circles/Pumps: AROM;Both;10 reps    General Comments        Pertinent Vitals/Pain Pain Location: right knee Pain Descriptors / Indicators: Aching;Grimacing;Sore    Home Living                      Prior Function            PT Goals (current goals can now be found in the care plan section) Acute Rehab PT Goals Patient Stated Goal: home soon PT Goal Formulation: With patient Time For Goal Achievement: 11/17/20 Potential to Achieve Goals: Good Progress towards PT goals: Progressing toward goals    Frequency    7X/week      PT Plan Current plan remains appropriate  Co-evaluation              AM-PAC PT "6 Clicks" Mobility   Outcome Measure  Help needed turning from your back to your side while in a flat bed without using bedrails?: A Little Help needed moving from lying on your back to sitting on the side of a flat bed without using bedrails?: A Little Help needed moving to and from a bed to a chair (including a wheelchair)?: A Little Help needed standing up from a chair using your arms (e.g., wheelchair or bedside chair)?: A Little Help needed to walk in hospital room?: A Little Help needed climbing 3-5 steps with a railing? : A Little 6 Click Score: 18    End of Session Equipment Utilized During Treatment: Gait belt Activity Tolerance: Patient tolerated treatment well Patient left: with call bell/phone within reach;in  bed;with bed alarm set Nurse Communication: Mobility status PT Visit Diagnosis: Other abnormalities of gait and mobility (R26.89)     Time: [id number] PT Time Calculation (min) (ACUTE ONLY): 28 min  Charges:  $Gait Training: 8-22 mins $Therapeutic Activity: 8-22 mins                     Baxter Flattery, PT  Acute Rehab Dept ([place]) [phone] Pager [phone]  11/11/2020    [facility] 11/11/2020, 1:13 PM

## 2020-11-11 NOTE — Progress Notes (Signed)
11/11/20 1546  PT Visit Information  Last PT Received On 11/11/20  Stairs reviewed again to ensure safety with pt having incr pain today. Pt did well, feels he can manage at home with spouse assist prn.   Assistance Needed +1  History of Present Illness 65 yo male adm with R TKA. PMH: multiple back surgeries, RA, MI, HTN, CAD, DM  Subjective Data  Patient Stated Goal home soon  Precautions  Precautions Knee  Precaution Comments reviewed use, precautions and donning KI  Required Braces or Orthoses Knee Immobilizer - Right  Restrictions  Weight Bearing Restrictions No  Pain Assessment  Pain Assessment 0-10  Pain Score 4  Pain Location right knee  Pain Descriptors / Indicators Aching;Grimacing;Sore  Pain Intervention(s) Limited activity within patient's tolerance;Monitored during session;Premedicated before session;Repositioned;Ice applied  Cognition  Arousal/Alertness Awake/alert  Behavior During Therapy WFL for tasks assessed/performed  Overall Cognitive Status Within Functional Limits for tasks assessed  Transfers  Overall transfer level Needs assistance  Equipment used Rolling walker (2 wheeled)  Transfers Sit to/from Stand  Sit to Stand Supervision  General transfer comment cues for hand placement and RLE position. extra time, no physical assist  Ambulation/Gait  Ambulation/Gait assistance Min guard;Supervision  Gait Distance (Feet) 25 Feet  Assistive device Rolling walker (2 wheeled)  Gait Pattern/deviations Step-through pattern;Decreased stance time - right  General Gait Details cues for sequence and RW position from self as well as trunk extension, supervision to min-guard for safety.no LOB, improved wt shift to RLE  Stairs Yes  Stairs assistance Min guard  Stair Management Step to pattern;Sideways  Number of Stairs 2  General stair comments cues for sequence and safe technique  Total Joint Exercises  Quad Sets  (deferred ex  d/t pt not wanting to get pain "out of  control" and pt bleeding from incision site yesterday)  PT - End of Session  Equipment Utilized During Treatment Gait belt  Activity Tolerance Patient tolerated treatment well  Patient left with call bell/phone within reach;in chair;with chair alarm set  Nurse Communication Mobility status   PT - Assessment/Plan  PT Plan Current plan remains appropriate  PT Visit Diagnosis Other abnormalities of gait and mobility (R26.89)  PT Frequency (ACUTE ONLY) 7X/week  Follow Up Recommendations Follow surgeon's recommendation for DC plan and follow-up therapies  PT equipment None recommended by PT  AM-PAC PT "6 Clicks" Mobility Outcome Measure (Version 2)  Help needed turning from your back to your side while in a flat bed without using bedrails? 3  Help needed moving from lying on your back to sitting on the side of a flat bed without using bedrails? 3  Help needed moving to and from a bed to a chair (including a wheelchair)? 3  Help needed standing up from a chair using your arms (e.g., wheelchair or bedside chair)? 3  Help needed to walk in hospital room? 3  Help needed climbing 3-5 steps with a railing?  3  6 Click Score 18  Consider Recommendation of Discharge To: Home with [facility]  PT Goal Progression  Progress towards PT goals Progressing toward goals  Acute Rehab PT Goals  PT Goal Formulation With patient  Time For Goal Achievement 11/17/20  Potential to Achieve Goals Good  PT Time Calculation  PT Start Time (ACUTE ONLY) 1500  PT Stop Time (ACUTE ONLY) 1511  PT Time Calculation (min) (ACUTE ONLY) 11 min  PT General Charges  $$ ACUTE PT VISIT 1 Visit  PT Treatments  $Gait  Training 8-22 mins

## 2020-11-11 NOTE — Discharge Summary (Signed)
Physician Discharge Summary  Patient ID: Thomas Mcclure MRN: [id number] DOB/AGE: [birth year] 65 y.o.  Admit date: 11/09/2020 Discharge date: 11/11/2020  Admission Diagnoses: right knee OA  Discharge Diagnoses:  Active Problems:   Primary localized osteoarthritis of right knee   Discharged Condition: fair  Hospital Course: Patient underwent a right TKA by Dr. French Ana on 11/09/20. He had some issues with anesthesia and pain control so he was admitted to the floor. His pain control and mobilization improved. He passed his PT evals and is ready to go home.   Consults: None  Significant Diagnostic Studies: none  Treatments: IV hydration, antibiotics: Ancef, analgesia: acetaminophen, Dilaudid, and Oxycodone, anticoagulation: ASA, and surgery: right TKA  Discharge Exam: Blood pressure 110/79, pulse 100, temperature 98.8 F (37.1 C), temperature source Oral, resp. rate 16, height 6' (1.829 m), weight 114 kg, SpO2 95 %. General appearance: alert, cooperative, and no distress Head: Normocephalic, without obvious abnormality, atraumatic Eyes: conjunctivae/corneas clear. PERRL, EOM's intact. Fundi benign. Resp: clear to auscultation bilaterally Cardio: regular rate and rhythm, S1, S2 normal, no murmur, click, rub or gallop GI: soft, non-tender; bowel sounds normal; no masses,  no organomegaly Extremities: extremities normal, atraumatic, no cyanosis or edema Pulses:  L brachial 2+ R brachial 2+  L radial 2+ R radial 2+  L inguinal 2+ R inguinal 2+  L popliteal 2+ R popliteal 2+  L posterior tibial 2+ R posterior tibial 2+  L dorsalis pedis 2+ R dorsalis pedis 2+   Neurologic: Alert and oriented X 3, normal strength and tone. Normal symmetric reflexes. Normal coordination and gait Incision/Wound: c/d/i  Disposition: Discharge disposition: 01-Home or Self Care       Discharge Instructions     Call MD / Call 911   Complete by: As directed    If you experience chest pain or  shortness of breath, CALL 911 and be transported to the hospital emergency room.  If you develope a fever above 101 F, pus (white drainage) or increased drainage or redness at the wound, or calf pain, call your surgeon's office.   Call MD for:  redness, tenderness, or signs of infection (pain, swelling, redness, odor or green/yellow discharge around incision site)   Complete by: As directed    Call MD for:  severe uncontrolled pain   Complete by: As directed    Call MD for:  temperature >100.4   Complete by: As directed    Diet - low sodium heart healthy   Complete by: As directed    Discharge instructions   Complete by: As directed    You may bear weight as tolerated. Keep your dressing on and dry until follow up. Take medicine to prevent blood clots as directed. Take pain medicine as needed with the goal of transitioning to over the counter medicines.    INSTRUCTIONS AFTER JOINT REPLACEMENT   Remove items at home which could result in a fall. This includes throw rugs or furniture in walking pathways ICE to the affected joint every three hours while awake for 30 minutes at a time, for at least the first 3-5 days, and then as needed for pain and swelling.  Continue to use ice for pain and swelling. You may notice swelling that will progress down to the foot and ankle.  This is normal after surgery.  Elevate your leg when you are not up walking on it.   Continue to use the breathing machine you got in the hospital (incentive spirometer) which will help keep  your temperature down.  It is common for your temperature to cycle up and down following surgery, especially at night when you are not up moving around and exerting yourself.  The breathing machine keeps your lungs expanded and your temperature down.   DIET:  As you were doing prior to hospitalization, we recommend a well-balanced diet.  DRESSING / WOUND CARE / SHOWERING  You may shower 3 days after surgery, but keep the wounds dry  during showering.  You may use an occlusive plastic wrap (Press'n Seal for example) with blue painter's tape at edges, NO SOAKING/SUBMERGING IN THE BATHTUB.  If the bandage gets wet, call the office.   ACTIVITY  Increase activity slowly as tolerated, but follow the weight bearing instructions below.   No driving for 6 weeks or until further direction given by your physician.  You cannot drive while taking narcotics.  No lifting or carrying greater than 10 lbs. until further directed by your surgeon. Avoid periods of inactivity such as sitting longer than an hour when not asleep. This helps prevent blood clots.  You may return to work once you are authorized by your doctor.    WEIGHT BEARING   Weight bearing as tolerated with assist device (walker, cane, etc) as directed, use it as long as suggested by your surgeon or therapist, typically at least 4-6 weeks.   EXERCISES  Results after joint replacement surgery are often greatly improved when you follow the exercise, range of motion and muscle strengthening exercises prescribed by your doctor. Safety measures are also important to protect the joint from further injury. Any time any of these exercises cause you to have increased pain or swelling, decrease what you are doing until you are comfortable again and then slowly increase them. If you have problems or questions, call your caregiver or physical therapist for advice.   Rehabilitation is important following a joint replacement. After just a few days of immobilization, the muscles of the leg can become weakened and shrink (atrophy).  These exercises are designed to build up the tone and strength of the thigh and leg muscles and to improve motion. Often times heat used for twenty to thirty minutes before working out will loosen up your tissues and help with improving the range of motion but do not use heat for the first two weeks following surgery (sometimes heat can increase post-operative  swelling).   These exercises can be done on a training (exercise) mat, on the floor, on a table or on a bed. Use whatever works the best and is most comfortable for you.    Use music or television while you are exercising so that the exercises are a pleasant break in your day. This will make your life better with the exercises acting as a break in your routine that you can look forward to.   Perform all exercises about fifteen times, three times per day or as directed.  You should exercise both the operative leg and the other leg as well.  Exercises include:   Quad Sets - Tighten up the muscle on the front of the thigh (Quad) and hold for 5-10 seconds.   Straight Leg Raises - With your knee straight (if you were given a brace, keep it on), lift the leg to 60 degrees, hold for 3 seconds, and slowly lower the leg.  Perform this exercise against resistance later as your leg gets stronger.  Leg Slides: Lying on your back, slowly slide your foot toward your buttocks,  bending your knee up off the floor (only go as far as is comfortable). Then slowly slide your foot back down until your leg is flat on the floor again.  Angel Wings: Lying on your back spread your legs to the side as far apart as you can without causing discomfort.  Hamstring Strength:  Lying on your back, push your heel against the floor with your leg straight by tightening up the muscles of your buttocks.  Repeat, but this time bend your knee to a comfortable angle, and push your heel against the floor.  You may put a pillow under the heel to make it more comfortable if necessary.   A rehabilitation program following joint replacement surgery can speed recovery and prevent re-injury in the future due to weakened muscles. Contact your doctor or a physical therapist for more information on knee rehabilitation.    CONSTIPATION  Constipation is defined medically as fewer than three stools per week and severe constipation as less than one stool  per week.  Even if you have a regular bowel pattern at home, your normal regimen is likely to be disrupted due to multiple reasons following surgery.  Combination of anesthesia, postoperative narcotics, change in appetite and fluid intake all can affect your bowels.   YOU MUST use at least one of the following options; they are listed in order of increasing strength to get the job done.  They are all available over the counter, and you may need to use some, POSSIBLY even all of these options:    Drink plenty of fluids (prune juice may be helpful) and high fiber foods Colace 100 mg by mouth twice a day  Senokot for constipation as directed and as needed Dulcolax (bisacodyl), take with full glass of water  Miralax (polyethylene glycol) once or twice a day as needed.  If you have tried all these things and are unable to have a bowel movement in the first 3-4 days after surgery call either your surgeon or your primary doctor.    If you experience loose stools or diarrhea, hold the medications until you stool forms back up.  If your symptoms do not get better within 1 week or if they get worse, check with your doctor.  If you experience "the worst abdominal pain ever" or develop nausea or vomiting, please contact the office immediately for further recommendations for treatment.   ITCHING:  If you experience itching with your medications, try taking only a single pain pill, or even half a pain pill at a time.  You can also use Benadryl over the counter for itching or also to help with sleep.   TED HOSE STOCKINGS:  Use stockings on both legs until for at least 2 weeks or as directed by physician office. They may be removed at night for sleeping.  MEDICATIONS:  See your medication summary on the "After Visit Summary" that nursing will review with you.  You may have some home medications which will be placed on hold until you complete the course of blood thinner medication.  It is important for you to  complete the blood thinner medication as prescribed.  Take medicines as prescribed.   You have several different medicines that work in different ways. - Tylenol is for mild to moderate pain. Try to take this medicine before turning to your narcotic medicines.  - Oxycodone is a narcotic pain medicine.  Take this for severe pain. This medicine can be dehydrating / constipating. - Aspirin is to  prevent blood clots after surgery.   PRECAUTIONS:  If you experience chest pain or shortness of breath - call 911 immediately for transfer to the hospital emergency department.   If you develop a fever greater that 101 F, purulent drainage from wound, increased redness or drainage from wound, foul odor from the wound/dressing, or calf pain - CONTACT YOUR SURGEON.                                                   FOLLOW-UP APPOINTMENTS:  If you do not already have a post-op appointment, please call the office [phone] for an appointment to be seen by Dr. Percell Miller in 2 weeks.   OTHER INSTRUCTIONS:   MAKE SURE YOU:  Understand these instructions.  Get help right away if you are not doing well or get worse.    Thank you for letting us be a part of your medical care team.  It is a privilege we respect greatly.  We hope these instructions will help you stay on track for a fast and full recovery!   Do not put a pillow under the knee. Place it under the heel.   Complete by: As directed    Driving restrictions   Complete by: As directed    No driving for 6 weeks   Post-operative opioid taper instructions:   Complete by: As directed    POST-OPERATIVE OPIOID TAPER INSTRUCTIONS: It is important to wean off of your opioid medication as soon as possible. If you do not need pain medication after your surgery it is ok to stop day one. Opioids include: Codeine, Hydrocodone(Norco, Vicodin), Oxycodone(Percocet, oxycontin) and hydromorphone amongst others.  Long term and even short term use of opiods can  cause: Increased pain response Dependence Constipation Depression Respiratory depression And more.  Withdrawal symptoms can include Flu like symptoms Nausea, vomiting And more Techniques to manage these symptoms Hydrate well Eat regular healthy meals Stay active Use relaxation techniques(deep breathing, meditating, yoga) Do Not substitute Alcohol to help with tapering If you have been on opioids for less than two weeks and do not have pain than it is ok to stop all together.  Plan to wean off of opioids This plan should start within one week post op of your joint replacement. Maintain the same interval or time between taking each dose and first decrease the dose.  Cut the total daily intake of opioids by one tablet each day Next start to increase the time between doses. The last dose that should be eliminated is the evening dose.      Weight bearing as tolerated   Complete by: As directed       Allergies as of 11/11/2020       Reactions   Brilinta [ticagrelor] Shortness Of Breath, Other (See Comments)   CHEST PAIN    Methylprednisolone Other (See Comments)   DIAPHORESIS, HYPOTENSION   Prednisone Other (See Comments)   DIAPHORESIS, HYPOTENSION   Toradol [ketorolac Tromethamine] Other (See Comments)   DIAPHORESIS, HYPOTENSION   Adhesive [tape] Rash, Other (See Comments)   "blisters" ( NO SURGICAL TAPE, PLEASE)        Medication List     STOP taking these medications    celecoxib 400 MG capsule Commonly known as: CeleBREX   diclofenac Sodium 1 % Gel Commonly known as: VOLTAREN   DiThol  1.5 & 10 % Thpk Generic drug: Diclofenac Sodium-Menthol   QC LIDOCAINE PAIN RELIEF EX       TAKE these medications    Accu-Chek Aviva Plus test strip Generic drug: glucose blood   Accu-Chek Softclix Lancets lancets 4 (four) times daily.   acetaminophen 500 MG tablet Commonly known as: TYLENOL Take 1,000 mg by mouth 2 (two) times daily as needed (pain.). What  changed: Another medication with the same name was added. Make sure you understand how and when to take each.   acetaminophen 500 MG tablet Commonly known as: TYLENOL Take 2 tablets (1,000 mg total) by mouth every 8 (eight) hours. What changed: You were already taking a medication with the same name, and this prescription was added. Make sure you understand how and when to take each.   aspirin EC 81 MG tablet Take 1 tablet (81 mg total) by mouth 2 (two) times daily. What changed: when to take this   atorvastatin 80 MG tablet Commonly known as: LIPITOR Take 80 mg by mouth daily at 6 PM.   Belbuca 900 MCG Film Generic drug: Buprenorphine HCl Dissolve1 (one) film inside cheeks two times daily What changed:  how much to take how to take this when to take this Another medication with the same name was removed. Continue taking this medication, and follow the directions you see here.   carvedilol 12.5 MG tablet Commonly known as: COREG TAKE 1 TABLET(12.5 MG) BY MOUTH TWICE DAILY What changed: See the new instructions.   chlorthalidone 25 MG tablet Commonly known as: HYGROTON TAKE 1 TABLET BY MOUTH DAILY What changed: how much to take   cyclobenzaprine 10 MG tablet Commonly known as: FLEXERIL Take 1 tablet (10 mg total) by mouth 3 (three) times daily as needed for muscle spasms.   dapagliflozin propanediol 10 MG Tabs tablet Commonly known as: FARXIGA Take 10 mg by mouth daily.   diltiazem 180 MG 24 hr capsule Commonly known as: DILACOR XR Take 180 mg by mouth daily.   docusate sodium 100 MG capsule Commonly known as: COLACE Take 1 capsule (100 mg total) by mouth 2 (two) times daily.   DULoxetine 60 MG capsule Commonly known as: CYMBALTA Take 1 capsule (60 mg total) by mouth daily.   glipiZIDE 10 MG 24 hr tablet Commonly known as: GLUCOTROL XL Take 20 mg by mouth daily with breakfast. What changed:  Another medication with the same name was changed. Make sure you  understand how and when to take each. Another medication with the same name was removed. Continue taking this medication, and follow the directions you see here.   glipiZIDE 10 MG tablet Commonly known as: GLUCOTROL TAKE 1 TABLET BY MOUTH DAILY What changed:  how much to take Another medication with the same name was removed. Continue taking this medication, and follow the directions you see here.   lisinopril 20 MG tablet Commonly known as: ZESTRIL Take 20 mg by mouth at bedtime.   lubiprostone 24 MCG capsule Commonly known as: AMITIZA Take 48 mcg by mouth at bedtime.   metFORMIN 1000 MG tablet Commonly known as: GLUCOPHAGE Take 1,000 mg by mouth 2 (two) times daily with a meal.   metFORMIN 500 MG 24 hr tablet Commonly known as: Glucophage XR Take 2 tablets (1,000 mg total) by mouth 2 (two) times daily.   multivitamin with minerals Tabs tablet Take 1 tablet by mouth daily.   omeprazole 40 MG capsule Commonly known as: PRILOSEC Take 1 capsule (40 mg  total) by mouth daily.   Oxycodone HCl 20 MG Tabs Take 1 (one) tablet by mouth every six hours as needed What changed:  how much to take how to take this when to take this reasons to take this Another medication with the same name was removed. Continue taking this medication, and follow the directions you see here.   oxyCODONE 5 MG immediate release tablet Commonly known as: Oxy IR/ROXICODONE Take one tab po q4-6hrs prn pain breakthrough pain beyond your normal pain med. What changed:  medication strength additional instructions Another medication with the same name was removed. Continue taking this medication, and follow the directions you see here.   polyethylene glycol powder 17 GM/SCOOP powder Commonly known as: GLYCOLAX/MIRALAX Take 17 g by mouth daily. Mix in 8 oz water, juice, soda, coffee or tea and drink   potassium chloride SA 20 MEQ tablet Commonly known as: KLOR-CON Take 1 tablet (20 mEq total) by mouth  daily.   pregabalin 150 MG capsule Commonly known as: LYRICA Take 1 (one) capsule by mouth two times daily What changed:  how much to take how to take this when to take this Another medication with the same name was removed. Continue taking this medication, and follow the directions you see here.   senna-docusate 8.6-50 MG tablet Commonly known as: Senokot S Take 1 tablet by mouth 2 (two) times daily.   sildenafil 100 MG tablet Commonly known as: VIAGRA Take 100 mg by mouth as needed for erectile dysfunction.               Durable Medical Equipment  (From admission, onward)           Start     Ordered   11/09/20 1614  DME Walker rolling  Once       Question Answer Comment  Walker: With [place] Wheels   Patient needs a walker to treat with the following condition Primary localized osteoarthritis of right knee      11/09/20 1613              Discharge Care Instructions  (From admission, onward)           Start     Ordered   11/11/20 0000  Weight bearing as tolerated        11/11/20 1436            Follow-up Information     [name], MD. Schedule an appointment as soon as possible for a visit in 2 week(s).   Specialty: Orthopedic Surgery Why: or as previously scheduled Contact information: [address] [place] [place] [address] [phone]                 Signed: Alisa Graff 11/11/2020, 2:36 PM

## 2020-11-11 NOTE — Plan of Care (Signed)
  Problem: Clinical Measurements: Goal: Respiratory complications will improve Outcome: Progressing   Problem: Clinical Measurements: Goal: Cardiovascular complication will be avoided Outcome: Progressing   Problem: Skin Integrity: Goal: Risk for impaired skin integrity will decrease Outcome: Progressing   Problem: Education: Goal: Knowledge of the prescribed therapeutic regimen will improve Outcome: Progressing   Problem: Pain Management: Goal: Pain level will decrease with appropriate interventions Outcome: Progressing

## 2020-11-11 NOTE — Progress Notes (Signed)
   11/11/20 1500  PT Visit Information  Last PT Received On 11/11/20   Assistance Needed +1  History of Present Illness 65 yo male adm with R TKA. PMH: multiple back surgeries, RA, MI, HTN, CAD, DM  Subjective Data  Patient Stated Goal home soon  Precautions  Precautions Knee  Pain Assessment  Pain Assessment 0-10  Pain Score 5  Pain Location right knee  Pain Descriptors / Indicators Aching;Grimacing;Sore  Pain Intervention(s) Limited activity within patient's tolerance;Monitored during session;Premedicated before session;Repositioned  Cognition  Arousal/Alertness Awake/alert  Behavior During Therapy WFL for tasks assessed/performed  Overall Cognitive Status Within Functional Limits for tasks assessed  Bed Mobility  Overal bed mobility Needs Assistance  Bed Mobility Supine to Sit  Supine to sit Supervision;Modified independent (Device/Increase time)  General bed mobility comments pt is able to use gait belt as leg lifter to bring RLE on and off bed with incr time  Transfers  Overall transfer level Needs assistance  Equipment used Rolling walker (2 wheeled)  Transfers Sit to/from Stand  Sit to Stand Supervision;Min guard  General transfer comment cues for hand placement and RLE position. bed ht elevated, able to perform from [facility]  with same assist  Ambulation/Gait  Ambulation/Gait assistance Min guard;Supervision  Gait Distance (Feet) 15 Feet  Assistive device Rolling walker (2 wheeled)  Gait Pattern/deviations Step-through pattern;Decreased stance time - right  General Gait Details cues for sequence and RW position from self as well as trunk extension, supervision to min-guard for safety  Total Joint Exercises  Quad Sets  (deferred ex  d/t pt not wanting to get pain "out of control" and pt bleeding from incision site yesterday)  PT - End of Session  Equipment Utilized During Treatment Gait belt  Activity Tolerance Patient tolerated treatment well  Patient left with call  bell/phone within reach;Other (comment) (bathroom)  Nurse Communication Mobility status   PT - Assessment/Plan  PT Plan Current plan remains appropriate  PT Visit Diagnosis Other abnormalities of gait and mobility (R26.89)  PT Frequency (ACUTE ONLY) 7X/week  Follow Up Recommendations Follow surgeon's recommendation for DC plan and follow-up therapies  PT equipment None recommended by PT  AM-PAC PT "6 Clicks" Mobility Outcome Measure (Version 2)  Help needed turning from your back to your side while in a flat bed without using bedrails? 3  Help needed moving from lying on your back to sitting on the side of a flat bed without using bedrails? 3  Help needed moving to and from a bed to a chair (including a wheelchair)? 3  Help needed standing up from a chair using your arms (e.g., wheelchair or bedside chair)? 3  Help needed to walk in hospital room? 3  Help needed climbing 3-5 steps with a railing?  3  6 Click Score 18  Consider Recommendation of Discharge To: Home with [facility]  PT Goal Progression  Progress towards PT goals Progressing toward goals  Acute Rehab PT Goals  PT Goal Formulation With patient  Time For Goal Achievement 11/17/20  Potential to Achieve Goals Good  PT Time Calculation  PT Start Time (ACUTE ONLY) 1429  PT Stop Time (ACUTE ONLY) 1452  PT Time Calculation (min) (ACUTE ONLY) 23 min  PT General Charges  $$ ACUTE PT VISIT 1 Visit  PT Treatments  $Gait Training 8-22 mins  $Therapeutic Activity 8-22 mins

## 2020-11-12 ENCOUNTER — Other Ambulatory Visit (HOSPITAL_COMMUNITY): Payer: Self-pay

## 2020-11-12 NOTE — Anesthesia Postprocedure Evaluation (Signed)
Anesthesia Post Note  Patient: Thomas Mcclure  Procedure(s) Performed: TOTAL KNEE ARTHROPLASTY (Right: Knee)     Patient location during evaluation: PACU Anesthesia Type: General Level of consciousness: awake and alert Pain management: pain level controlled Vital Signs Assessment: post-procedure vital signs reviewed and stable Respiratory status: spontaneous breathing, nonlabored ventilation, respiratory function stable and patient connected to nasal cannula oxygen Cardiovascular status: blood pressure returned to baseline and stable Postop Assessment: no apparent nausea or vomiting Anesthetic complications: no   No notable events documented.  Last Vitals:  Vitals:   11/10/20 2248 11/11/20 0700  BP: 106/72 110/79  Pulse: (!) 110 100  Resp: 17 16  Temp: 36.8 C 37.1 C  SpO2: 94% 95%    Last Pain:  Vitals:   11/11/20 1232  TempSrc:   PainSc: 3                  Tiajuana Amass

## 2020-11-23 ENCOUNTER — Telehealth: Payer: Self-pay | Admitting: Interventional Cardiology

## 2020-11-23 NOTE — Telephone Encounter (Signed)
Called and spoke with pt wife ok per DPR.  She reports that pt right leg, scrotum, and face are swollen.  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  Wife expresses that swelling to leg started last Thursday 11/15/20 and has progressively gotten worse.  She also says he has excessive sweating from his scrotum and also has a red area to foreskin.  He was up all night going back and forth to the bathroom and says it burns when he urinates.  When I spoke with pt he expresses that wife is going over board swelling is not as bad as wife is making it out to be.  When asked if his scrotum is swollen he says no but he does have a red place that burns when he urinates.  He thinks that swelling to right leg is not bad considering he had knee surgery.  He says that orthopedic office told him to put on compression socks and elevate leg.  Wife says pt has been elevating and icing leg since surgery and swelling is getting worse.  Pt and wife began to argue about severity of symptoms and I could not get a word in.  Wife states pt will be okay; he never listens to her- she is not going to worry anymore and hung up.  I waited 10 minutes and called back, they were still arguing.  I did manage to tell pt to weigh himself daily at the same time and write it down.  Also to check BP daily.  If he develops leg pain, sob, difficulty breathing to call 911.  I also advised him to contact Surgeon to see if he needs to have a [place] of RLE for possible clot.  Also to reach out to PCP in regards to urinary issues.  Pt did say he is taking chlorthalidone. Pt left the room and wife says that she was only trying to help and is not going to worry and good bye. I did advice her to call 911 if needed before she hung up again. Will route to MD.

## 2020-11-23 NOTE — Telephone Encounter (Signed)
Pt c/o swelling: STAT is pt has developed SOB within 24 hours  If swelling, where is the swelling located? Right leg, stomach, private parts, and face  How much weight have you gained and in what time span? Doesn't know   Have you gained 3 pounds in a day or 5 pounds in a week? Thinks 5 lbs since Monday  Do you have a log of your daily weights (if so, list)? No   Are you currently taking a fluid pill? Yes  Are you currently SOB? No   Have you traveled recently? No      [name] is calling stating they were just at [facility] and are hoping to be worked in today. They advised Mattheus may have a UTI. They are concerned with his severe swelling and are hoping to have a urine sample taken. No other symptoms are present. Please advise.

## 2020-12-06 ENCOUNTER — Other Ambulatory Visit (HOSPITAL_COMMUNITY): Payer: Self-pay

## 2020-12-06 MED ORDER — BELBUCA 900 MCG BU FILM
ORAL_FILM | BUCCAL | 0 refills | Status: DC
  Filled 2020-12-06 (×2): qty 58, 29d supply, fill #0

## 2020-12-06 MED ORDER — OXYCODONE HCL 20 MG PO TABS
ORAL_TABLET | ORAL | 0 refills | Status: DC
  Filled 2020-12-06: qty 120, 30d supply, fill #0

## 2020-12-06 MED ORDER — PREGABALIN 150 MG PO CAPS
ORAL_CAPSULE | ORAL | 0 refills | Status: DC
  Filled 2020-12-06 – 2021-04-01 (×2): qty 60, 30d supply, fill #0

## 2020-12-18 ENCOUNTER — Ambulatory Visit (HOSPITAL_COMMUNITY)
Admission: RE | Admit: 2020-12-18 | Discharge: 2020-12-18 | Disposition: A | Discharge status: Home / Self Care | Payer: Medicare Other | Source: Ambulatory Visit | Attending: Orthopedic Surgery | Admitting: Orthopedic Surgery

## 2020-12-18 ENCOUNTER — Other Ambulatory Visit: Payer: Self-pay

## 2020-12-18 ENCOUNTER — Other Ambulatory Visit (HOSPITAL_COMMUNITY): Payer: Self-pay | Admitting: Orthopedic Surgery

## 2020-12-18 DIAGNOSIS — M7989 Other specified soft tissue disorders: Secondary | ICD-10-CM

## 2020-12-18 DIAGNOSIS — M79604 Pain in right leg: Secondary | ICD-10-CM | POA: Insufficient documentation

## 2021-01-03 ENCOUNTER — Other Ambulatory Visit (HOSPITAL_COMMUNITY): Payer: Self-pay

## 2021-01-03 MED ORDER — OXYCODONE HCL 20 MG PO TABS: 20.0000 mg | ORAL_TABLET | Freq: Four times a day (QID) | ORAL | 0 refills | Status: DC | PRN

## 2021-01-03 MED ORDER — BELBUCA 900 MCG BU FILM
ORAL_FILM | BUCCAL | 0 refills | Status: DC
  Filled 2021-01-03: qty 54, 27d supply, fill #0

## 2021-01-03 MED ORDER — PREGABALIN 150 MG PO CAPS
ORAL_CAPSULE | ORAL | 0 refills | Status: DC
  Filled 2021-01-03: qty 56, 28d supply, fill #0

## 2021-01-03 MED ORDER — BELBUCA 900 MCG BU FILM
ORAL_FILM | BUCCAL | 0 refills | Status: DC
  Filled 2021-01-03: qty 58, 29d supply, fill #0
  Filled 2021-03-01: qty 46, 23d supply, fill #0
  Filled 2021-03-01: qty 12, 6d supply, fill #0

## 2021-01-03 MED ORDER — OXYCODONE HCL 20 MG PO TABS
ORAL_TABLET | ORAL | 0 refills | Status: DC
  Filled 2021-01-03: qty 112, 28d supply, fill #0

## 2021-01-03 MED ORDER — PREGABALIN 150 MG PO CAPS: 150.0000 mg | ORAL_CAPSULE | Freq: Two times a day (BID) | ORAL | 0 refills | Status: DC

## 2021-01-04 ENCOUNTER — Other Ambulatory Visit (HOSPITAL_COMMUNITY): Payer: Self-pay

## 2021-01-30 ENCOUNTER — Other Ambulatory Visit (HOSPITAL_COMMUNITY): Payer: Self-pay

## 2021-01-30 MED ORDER — BELBUCA 900 MCG BU FILM
ORAL_FILM | BUCCAL | 0 refills | Status: DC
  Filled 2021-01-30: qty 48, 24d supply, fill #0
  Filled 2021-01-30: qty 6, 3d supply, fill #0

## 2021-01-30 MED ORDER — PREGABALIN 150 MG PO CAPS
150.0000 mg | ORAL_CAPSULE | Freq: Two times a day (BID) | ORAL | 0 refills | Status: DC
  Filled 2021-01-30: qty 54, 27d supply, fill #0

## 2021-01-30 MED ORDER — OXYCODONE HCL 20 MG PO TABS
20.0000 mg | ORAL_TABLET | Freq: Four times a day (QID) | ORAL | 0 refills | Status: DC | PRN
  Filled 2021-01-30: qty 110, 28d supply, fill #0

## 2021-01-31 ENCOUNTER — Other Ambulatory Visit (HOSPITAL_COMMUNITY): Payer: Self-pay

## 2021-02-11 ENCOUNTER — Other Ambulatory Visit (HOSPITAL_COMMUNITY): Payer: Self-pay

## 2021-02-26 ENCOUNTER — Other Ambulatory Visit: Payer: Self-pay | Admitting: *Deleted

## 2021-02-26 MED ORDER — CARVEDILOL 12.5 MG PO TABS: 12.5000 mg | ORAL_TABLET | Freq: Two times a day (BID) | ORAL | 1 refills | Status: DC

## 2021-03-01 ENCOUNTER — Other Ambulatory Visit (HOSPITAL_COMMUNITY): Payer: Self-pay

## 2021-03-01 MED ORDER — BELBUCA 900 MCG BU FILM
ORAL_FILM | BUCCAL | 0 refills | Status: DC
  Filled 2021-03-01: qty 60, 30d supply, fill #0
  Filled 2021-04-01: qty 54, 27d supply, fill #0
  Filled 2021-04-01: qty 6, 3d supply, fill #0

## 2021-03-01 MED ORDER — OXYCODONE HCL 20 MG PO TABS
ORAL_TABLET | ORAL | 0 refills | Status: DC
  Filled 2021-03-01: qty 120, 30d supply, fill #0

## 2021-03-01 MED ORDER — PREGABALIN 150 MG PO CAPS
ORAL_CAPSULE | ORAL | 0 refills | Status: DC
  Filled 2021-03-01 (×3): qty 60, 30d supply, fill #0

## 2021-03-04 ENCOUNTER — Other Ambulatory Visit (HOSPITAL_COMMUNITY): Payer: Self-pay

## 2021-03-08 ENCOUNTER — Telehealth: Payer: Self-pay | Admitting: Interventional Cardiology

## 2021-03-08 MED ORDER — CARVEDILOL 12.5 MG PO TABS: 12.5000 mg | ORAL_TABLET | Freq: Two times a day (BID) | ORAL | 2 refills | Status: DC

## 2021-03-08 MED ORDER — CHLORTHALIDONE 25 MG PO TABS: ORAL_TABLET | Freq: Every day | ORAL | 2 refills | Status: DC

## 2021-03-08 NOTE — Telephone Encounter (Signed)
Pt c/o swelling: STAT is pt has developed SOB within 24 hours  How much weight have you gained and in what time span?  No weight gain   If swelling, where is the swelling located?  Right ankle/leg  Are you currently taking a fluid pill?  Yes   Are you currently SOB?  No   Do you have a log of your daily weights (if so, list)?  No log available, but patient's wife states his weight remains at about 252 lbs Have you gained 3 pounds in a day or 5 pounds in a week?  No  Have you traveled recently?  No

## 2021-03-08 NOTE — Telephone Encounter (Signed)
Pt's medication was sent to pt's pharmacy as requested. Confirmation received.  °

## 2021-03-08 NOTE — Telephone Encounter (Signed)
Wife states pt has been having issues with intermittent swelling of the right leg since his knee surgery.  States they had to drain it one time and what they got off looked like orange juice.  Pt seen by PCP recently and they ordered for him to hold Chlorthalidone and take Furosemide 20mg  QD for 7 days instead.  Wife states they also did an ultrasound and xray that showed stones in both kidneys and a large cyst on the right kidney.  He is being referred to Nephrology.  Wife was calling us to see if we had any recommendations on who he should see at Nephrology.  Advised we typically refer to [facility].  Wife very appreciative for call.

## 2021-03-08 NOTE — Telephone Encounter (Signed)
Left message to call back  

## 2021-03-08 NOTE — Telephone Encounter (Signed)
*  STAT* If patient is at the pharmacy, call can be transferred to refill team.   1. Which medications need to be refilled? (please list name of each medication and dose if known) carvedilol (COREG) 12.5 MG tablet chlorthalidone (HYGROTON) 25 MG tablet  2. Which pharmacy/location (including street and city if local pharmacy) is medication to be sent to? [place], [place]  3. Do they need a 30 day or 90 day supply?  90 day supply

## 2021-03-18 ENCOUNTER — Other Ambulatory Visit (HOSPITAL_COMMUNITY): Payer: Self-pay

## 2021-04-01 ENCOUNTER — Other Ambulatory Visit (HOSPITAL_COMMUNITY): Payer: Self-pay

## 2021-04-01 MED ORDER — PREGABALIN 150 MG PO CAPS
ORAL_CAPSULE | ORAL | 0 refills | Status: DC
  Filled 2021-04-29: qty 60, 30d supply, fill #0

## 2021-04-01 MED ORDER — BELBUCA 900 MCG BU FILM
ORAL_FILM | BUCCAL | 0 refills | Status: DC
  Filled 2021-04-29: qty 54, 27d supply, fill #0
  Filled 2021-04-29: qty 6, 3d supply, fill #0

## 2021-04-01 MED ORDER — OXYCODONE HCL 20 MG PO TABS
ORAL_TABLET | ORAL | 0 refills | Status: DC
  Filled 2021-04-01: qty 120, 30d supply, fill #0

## 2021-04-02 ENCOUNTER — Other Ambulatory Visit (HOSPITAL_COMMUNITY): Payer: Self-pay

## 2021-04-29 ENCOUNTER — Other Ambulatory Visit (HOSPITAL_COMMUNITY): Payer: Self-pay

## 2021-04-30 ENCOUNTER — Other Ambulatory Visit (HOSPITAL_COMMUNITY): Payer: Self-pay

## 2021-04-30 MED ORDER — BELBUCA 900 MCG BU FILM
ORAL_FILM | BUCCAL | 0 refills | Status: DC
  Filled 2021-05-30: qty 60, 30d supply, fill #0
  Filled 2021-05-30: qty 6, 3d supply, fill #0
  Filled 2021-05-30: qty 54, 27d supply, fill #0

## 2021-04-30 MED ORDER — PREGABALIN 150 MG PO CAPS
ORAL_CAPSULE | ORAL | 0 refills | Status: DC
  Filled 2021-06-27: qty 60, 30d supply, fill #0

## 2021-04-30 MED ORDER — OXYCODONE HCL 20 MG PO TABS
ORAL_TABLET | ORAL | 0 refills | Status: DC
  Filled 2021-04-30: qty 120, 30d supply, fill #0

## 2021-05-02 ENCOUNTER — Other Ambulatory Visit (HOSPITAL_COMMUNITY): Payer: Self-pay

## 2021-05-02 MED ORDER — DILTIAZEM HCL ER 180 MG PO CP24
ORAL_CAPSULE | ORAL | 0 refills | Status: DC
  Filled 2021-05-02: qty 30, 30d supply, fill #0

## 2021-05-02 MED ORDER — POTASSIUM CHLORIDE ER 20 MEQ PO TBCR
1.0000 | EXTENDED_RELEASE_TABLET | Freq: Every day | ORAL | 0 refills | Status: DC
  Filled 2021-05-02: qty 30, 30d supply, fill #0

## 2021-05-02 MED ORDER — LUBIPROSTONE 24 MCG PO CAPS
ORAL_CAPSULE | ORAL | 0 refills | Status: DC
  Filled 2021-05-02 – 2021-09-04 (×2): qty 180, 90d supply, fill #0

## 2021-05-03 ENCOUNTER — Other Ambulatory Visit (HOSPITAL_COMMUNITY): Payer: Self-pay

## 2021-05-07 ENCOUNTER — Telehealth: Payer: Self-pay

## 2021-05-09 ENCOUNTER — Telehealth: Payer: Self-pay | Admitting: *Deleted

## 2021-05-09 NOTE — Telephone Encounter (Signed)
? ?  Pre-operative Risk Assessment  ?  ?Patient Name: Thomas Mcclure  ?DOB: 1955-05-02 ?MRN: [id number]  ? ? ? ?Request for Surgical Clearance   ? ?Procedure:   LEFT MESSAGE FOR CLARIFICATION ? ?Date of Surgery:  Clearance TBD                              ?   ?Surgeon:  NEED NAME  ?Surgeon's Group or Practice Name:  [facility]  ?Phone number:  [phone] ?Fax number:  [phone] ?  ?Type of Clearance Requested:   ?NEED INFO ?  ?Type of Anesthesia:   LEFT MESSAGE ?  ?Additional requests/questions:   ? ?Signed, ?Julaine Hua   ?05/09/2021, 1:30 PM  ? ?

## 2021-05-10 ENCOUNTER — Other Ambulatory Visit (HOSPITAL_COMMUNITY): Payer: Self-pay

## 2021-05-13 ENCOUNTER — Other Ambulatory Visit (HOSPITAL_COMMUNITY): Payer: Self-pay

## 2021-05-15 NOTE — Telephone Encounter (Signed)
I s/w [facility] office. It was stated to me by Susie that they have referred the pt to an oral surgeon Dr. Pearline Cables, DDS, as he is going to need multiple teeth extracted.  ? ?I then did call Dr. Katheren Shams office to request for a clearance request to be faxed over to our office, though they did not yet have the pt in their system. Stated that possibly [facility] has not sent over referral yet to them. I thanked them for their time. I stated we will await a clearance request to come over from their office. In the meantime I will remove from the pre op call back pool.  ?

## 2021-05-15 NOTE — Telephone Encounter (Signed)
Have not heard back from the dental office. I will fax notes to their office asking to please clarify the procedure to be done, need name of performing provider, need what type of anesthesia to be used if any. Please keep in mind the the cardiologist cannot not provide a blanket type clearance if pt has a multi Tx plan with your office. Will need to be able to try and clear the pt for the procedure to be done at the time. For example if pt has having teeth extracted; we will need to now how many teeth are to be extracted (not the number of the tooth location in the mouth), if any anesthesia to be used, date of procedure, name of provider doing procedure, any meds need to be help (blood thinners). Please fax over a new clearance wit the needed information. Pt will not be cleared by cardiology until pre op assessment with completed clearance request, in order for our pre op team to make an informed decision on care.  ?

## 2021-05-17 ENCOUNTER — Other Ambulatory Visit (HOSPITAL_BASED_OUTPATIENT_CLINIC_OR_DEPARTMENT_OTHER): Payer: Self-pay

## 2021-05-29 ENCOUNTER — Telehealth: Payer: Self-pay | Admitting: Interventional Cardiology

## 2021-05-29 NOTE — Telephone Encounter (Signed)
?*  STAT* If patient is at the pharmacy, call can be transferred to refill team. ? ? ?1. Which medications need to be refilled? (please list name of each medication and dose if known)  ?carvedilol (COREG) 12.5 MG tablet ? ?2. Which pharmacy/location (including street and city if local pharmacy) is medication to be sent to? ?[id number] - [place], [place] ? ?3. Do they need a 30 day or 90 day supply?  ?90 day supply ? ?Patient's wife states the pharmacy doesn't have refills on Carvedilol. ?

## 2021-05-29 NOTE — Telephone Encounter (Signed)
Returned call to the pt, she was informed that pt has a prescription for Carvedilol that was sent 03/08/21 with 2 additional refills.  Per pt's wife, they were told from [facility] that there wasn't any refills there. ? ?I placed call to [facility] to confirm prescription for Carvedilol was there, per pharmacist it is ready. Asked pharmacist to have someone call the pt to let them know. ?

## 2021-05-30 ENCOUNTER — Other Ambulatory Visit (HOSPITAL_COMMUNITY): Payer: Self-pay

## 2021-05-30 MED ORDER — PREGABALIN 150 MG PO CAPS
ORAL_CAPSULE | ORAL | 0 refills | Status: DC
  Filled 2021-05-30: qty 60, 30d supply, fill #0

## 2021-05-30 MED ORDER — OXYCODONE HCL 20 MG PO TABS
ORAL_TABLET | ORAL | 0 refills | Status: DC
  Filled 2021-05-30: qty 120, 30d supply, fill #0

## 2021-05-30 MED ORDER — BELBUCA 900 MCG BU FILM
ORAL_FILM | BUCCAL | 0 refills | Status: DC
  Filled 2021-05-30: qty 60, 30d supply, fill #0
  Filled 2021-06-27: qty 54, 27d supply, fill #0
  Filled 2021-06-27: qty 6, 3d supply, fill #0

## 2021-05-31 ENCOUNTER — Other Ambulatory Visit (HOSPITAL_COMMUNITY): Payer: Self-pay

## 2021-06-12 ENCOUNTER — Other Ambulatory Visit: Payer: Self-pay | Admitting: Interventional Cardiology

## 2021-06-27 ENCOUNTER — Other Ambulatory Visit (HOSPITAL_COMMUNITY): Payer: Self-pay

## 2021-06-28 ENCOUNTER — Other Ambulatory Visit (HOSPITAL_COMMUNITY): Payer: Self-pay

## 2021-06-28 MED ORDER — OXYCODONE HCL 20 MG PO TABS
20.0000 mg | ORAL_TABLET | Freq: Four times a day (QID) | ORAL | 0 refills | Status: DC | PRN
  Filled 2021-06-28 – 2021-06-29 (×2): qty 120, 30d supply, fill #0

## 2021-06-28 MED ORDER — PREGABALIN 150 MG PO CAPS
ORAL_CAPSULE | ORAL | 0 refills | Status: DC
  Filled 2021-06-28 – 2021-08-24 (×2): qty 60, 30d supply, fill #0

## 2021-06-28 MED ORDER — BELBUCA 900 MCG BU FILM
ORAL_FILM | BUCCAL | 0 refills | Status: DC
  Filled 2021-07-25: qty 6, 3d supply, fill #0
  Filled 2021-07-25: qty 54, 27d supply, fill #0

## 2021-06-29 ENCOUNTER — Other Ambulatory Visit (HOSPITAL_COMMUNITY): Payer: Self-pay

## 2021-07-25 ENCOUNTER — Other Ambulatory Visit (HOSPITAL_COMMUNITY): Payer: Self-pay

## 2021-07-25 MED ORDER — OXYCODONE HCL 20 MG PO TABS
ORAL_TABLET | ORAL | 0 refills | Status: DC
  Filled 2021-07-25: qty 120, 30d supply, fill #0
  Filled 2021-07-27: qty 120, fill #0
  Filled 2021-07-29: qty 120, 30d supply, fill #0

## 2021-07-25 MED ORDER — BELBUCA 900 MCG BU FILM
ORAL_FILM | BUCCAL | 0 refills | Status: DC
  Filled 2021-07-25: qty 60, 30d supply, fill #0
  Filled 2021-08-22: qty 54, 27d supply, fill #1
  Filled 2021-08-22: qty 6, 3d supply, fill #0
  Filled 2021-08-22: qty 54, 27d supply, fill #0

## 2021-07-25 MED ORDER — PREGABALIN 150 MG PO CAPS
ORAL_CAPSULE | ORAL | 0 refills | Status: DC
  Filled 2021-07-25 – 2021-07-27 (×2): qty 60, 30d supply, fill #0

## 2021-07-26 ENCOUNTER — Other Ambulatory Visit (HOSPITAL_COMMUNITY): Payer: Self-pay

## 2021-07-27 ENCOUNTER — Other Ambulatory Visit (HOSPITAL_COMMUNITY): Payer: Self-pay

## 2021-07-29 ENCOUNTER — Other Ambulatory Visit (HOSPITAL_COMMUNITY): Payer: Self-pay

## 2021-08-19 ENCOUNTER — Other Ambulatory Visit: Payer: Self-pay | Admitting: Interventional Cardiology

## 2021-08-19 ENCOUNTER — Other Ambulatory Visit (HOSPITAL_COMMUNITY): Payer: Self-pay

## 2021-08-19 MED ORDER — OMEPRAZOLE 40 MG PO CPDR
DELAYED_RELEASE_CAPSULE | ORAL | 0 refills | Status: DC
  Filled 2021-08-19: qty 90, 90d supply, fill #0

## 2021-08-19 MED ORDER — LISINOPRIL 20 MG PO TABS
20.0000 mg | ORAL_TABLET | Freq: Every day | ORAL | 1 refills | Status: DC
  Filled 2021-08-19: qty 90, 90d supply, fill #0

## 2021-08-19 MED ORDER — LUBIPROSTONE 24 MCG PO CAPS
48.0000 ug | ORAL_CAPSULE | Freq: Every evening | ORAL | 0 refills | Status: DC
  Filled 2021-08-19: qty 180, 90d supply, fill #0

## 2021-08-19 MED ORDER — DULOXETINE HCL 60 MG PO CPEP
ORAL_CAPSULE | ORAL | 1 refills | Status: DC
  Filled 2021-08-19: qty 90, 90d supply, fill #0
  Filled 2021-11-27: qty 90, 90d supply, fill #1

## 2021-08-19 MED ORDER — FARXIGA 10 MG PO TABS
10.0000 mg | ORAL_TABLET | Freq: Every day | ORAL | 0 refills | Status: DC
  Filled 2021-08-19: qty 90, 90d supply, fill #0

## 2021-08-22 ENCOUNTER — Other Ambulatory Visit (HOSPITAL_COMMUNITY): Payer: Self-pay

## 2021-08-23 ENCOUNTER — Other Ambulatory Visit (HOSPITAL_COMMUNITY): Payer: Self-pay

## 2021-08-23 MED ORDER — BELBUCA 900 MCG BU FILM
ORAL_FILM | BUCCAL | 0 refills | Status: DC
  Filled 2021-09-23: qty 6, 3d supply, fill #0
  Filled 2021-09-23: qty 54, 27d supply, fill #0

## 2021-08-23 MED ORDER — OXYCODONE HCL 20 MG PO TABS
ORAL_TABLET | ORAL | 0 refills | Status: DC
  Filled 2021-08-26: qty 120, 30d supply, fill #0

## 2021-08-23 MED ORDER — PREGABALIN 150 MG PO CAPS
ORAL_CAPSULE | ORAL | 0 refills | Status: DC
  Filled 2022-03-01: qty 60, 30d supply, fill #0

## 2021-08-24 ENCOUNTER — Other Ambulatory Visit (HOSPITAL_COMMUNITY): Payer: Self-pay

## 2021-08-26 ENCOUNTER — Other Ambulatory Visit (HOSPITAL_COMMUNITY): Payer: Self-pay

## 2021-08-29 ENCOUNTER — Other Ambulatory Visit (HOSPITAL_COMMUNITY): Payer: Self-pay

## 2021-09-04 ENCOUNTER — Other Ambulatory Visit (HOSPITAL_COMMUNITY): Payer: Self-pay

## 2021-09-11 ENCOUNTER — Other Ambulatory Visit (HOSPITAL_COMMUNITY): Payer: Self-pay

## 2021-09-13 ENCOUNTER — Other Ambulatory Visit (HOSPITAL_COMMUNITY): Payer: Self-pay

## 2021-09-14 ENCOUNTER — Other Ambulatory Visit: Payer: Self-pay | Admitting: Interventional Cardiology

## 2021-09-14 ENCOUNTER — Other Ambulatory Visit (HOSPITAL_COMMUNITY): Payer: Self-pay

## 2021-09-15 ENCOUNTER — Observation Stay (HOSPITAL_COMMUNITY)
Admission: EM | Admit: 2021-09-15 | Discharge: 2021-09-16 | Disposition: A | Discharge status: Home / Self Care | Payer: Medicare Other | Attending: Student in an Organized Health Care Education/Training Program | Admitting: Student in an Organized Health Care Education/Training Program

## 2021-09-15 ENCOUNTER — Emergency Department (HOSPITAL_COMMUNITY): Payer: Medicare Other

## 2021-09-15 ENCOUNTER — Other Ambulatory Visit: Payer: Self-pay

## 2021-09-15 ENCOUNTER — Encounter (HOSPITAL_COMMUNITY): Payer: Self-pay | Admitting: Physician Assistant

## 2021-09-15 DIAGNOSIS — R0789 Other chest pain: Secondary | ICD-10-CM | POA: Diagnosis not present

## 2021-09-15 DIAGNOSIS — R778 Other specified abnormalities of plasma proteins: Secondary | ICD-10-CM | POA: Insufficient documentation

## 2021-09-15 DIAGNOSIS — I5040 Unspecified combined systolic (congestive) and diastolic (congestive) heart failure: Secondary | ICD-10-CM | POA: Insufficient documentation

## 2021-09-15 DIAGNOSIS — M7918 Myalgia, other site: Secondary | ICD-10-CM | POA: Diagnosis not present

## 2021-09-15 DIAGNOSIS — E785 Hyperlipidemia, unspecified: Secondary | ICD-10-CM | POA: Diagnosis present

## 2021-09-15 DIAGNOSIS — Z955 Presence of coronary angioplasty implant and graft: Secondary | ICD-10-CM | POA: Diagnosis not present

## 2021-09-15 DIAGNOSIS — I2510 Atherosclerotic heart disease of native coronary artery without angina pectoris: Secondary | ICD-10-CM | POA: Diagnosis not present

## 2021-09-15 DIAGNOSIS — E1140 Type 2 diabetes mellitus with diabetic neuropathy, unspecified: Secondary | ICD-10-CM | POA: Insufficient documentation

## 2021-09-15 DIAGNOSIS — Z96651 Presence of right artificial knee joint: Secondary | ICD-10-CM | POA: Diagnosis not present

## 2021-09-15 DIAGNOSIS — I10 Essential (primary) hypertension: Secondary | ICD-10-CM | POA: Diagnosis present

## 2021-09-15 DIAGNOSIS — Z96641 Presence of right artificial hip joint: Secondary | ICD-10-CM | POA: Insufficient documentation

## 2021-09-15 DIAGNOSIS — Z79899 Other long term (current) drug therapy: Secondary | ICD-10-CM | POA: Insufficient documentation

## 2021-09-15 DIAGNOSIS — Z87891 Personal history of nicotine dependence: Secondary | ICD-10-CM | POA: Diagnosis not present

## 2021-09-15 DIAGNOSIS — G8929 Other chronic pain: Secondary | ICD-10-CM | POA: Diagnosis present

## 2021-09-15 DIAGNOSIS — R079 Chest pain, unspecified: Secondary | ICD-10-CM

## 2021-09-15 DIAGNOSIS — Z7984 Long term (current) use of oral hypoglycemic drugs: Secondary | ICD-10-CM | POA: Diagnosis not present

## 2021-09-15 DIAGNOSIS — M549 Dorsalgia, unspecified: Secondary | ICD-10-CM | POA: Diagnosis present

## 2021-09-15 DIAGNOSIS — Z7982 Long term (current) use of aspirin: Secondary | ICD-10-CM | POA: Insufficient documentation

## 2021-09-15 HISTORY — DX: Chronic combined systolic (congestive) and diastolic (congestive) heart failure: I50.42

## 2021-09-15 LAB — TROPONIN I (HIGH SENSITIVITY)
Troponin I (High Sensitivity): 175 ng/L (ref <18)
Troponin I (High Sensitivity): 175 ng/L (ref <18)

## 2021-09-15 LAB — LIPID PANEL
Cholesterol: 97 mg/dL (ref 0–200)
HDL: 32 mg/dL — ABNORMAL LOW (ref >40)
LDL Cholesterol: 55 mg/dL (ref 0–99)
Total CHOL/HDL Ratio: 3 RATIO
Triglycerides: 49 mg/dL (ref <150)
VLDL: 10 mg/dL (ref 0–40)

## 2021-09-15 LAB — CBC
HCT: 38.4 % — ABNORMAL LOW (ref 39.0–52.0)
Hemoglobin: 12.2 g/dL — ABNORMAL LOW (ref 13.0–17.0)
MCH: 25.5 pg — ABNORMAL LOW (ref 26.0–34.0)
MCHC: 31.8 g/dL (ref 30.0–36.0)
MCV: 80.2 fL (ref 80.0–100.0)
Platelets: 222 10*3/uL (ref 150–400)
RBC: 4.79 MIL/uL (ref 4.22–5.81)
RDW: 16.9 % — ABNORMAL HIGH (ref 11.5–15.5)
WBC: 6.9 10*3/uL (ref 4.0–10.5)
nRBC: 0 % (ref 0.0–0.2)

## 2021-09-15 LAB — BASIC METABOLIC PANEL
Anion gap: 8 (ref 5–15)
BUN: 16 mg/dL (ref 8–23)
CO2: 26 mmol/L (ref 22–32)
Calcium: 9.2 mg/dL (ref 8.9–10.3)
Chloride: 105 mmol/L (ref 98–111)
Creatinine, Ser: 0.94 mg/dL (ref 0.61–1.24)
GFR, Estimated: >60 mL/min (ref >60)
Glucose, Bld: 110 mg/dL — ABNORMAL HIGH (ref 70–99)
Potassium: 4 mmol/L (ref 3.5–5.1)
Sodium: 139 mmol/L (ref 135–145)

## 2021-09-15 LAB — HEMOGLOBIN A1C
Hgb A1c MFr Bld: 6.5 % — ABNORMAL HIGH (ref 4.8–5.6)
Mean Plasma Glucose: 139.85 mg/dL

## 2021-09-15 LAB — BRAIN NATRIURETIC PEPTIDE: B Natriuretic Peptide: 10.7 pg/mL (ref 0.0–100.0)

## 2021-09-15 LAB — HEPARIN LEVEL (UNFRACTIONATED): Heparin Unfractionated: 0.4 IU/mL (ref 0.30–0.70)

## 2021-09-15 LAB — MRSA NEXT GEN BY PCR, NASAL: MRSA by PCR Next Gen: NOT DETECTED

## 2021-09-15 LAB — HIV ANTIBODY (ROUTINE TESTING W REFLEX): HIV Screen 4th Generation wRfx: NONREACTIVE

## 2021-09-15 MED ORDER — ENOXAPARIN SODIUM 40 MG/0.4ML IJ SOSY
40.0000 mg | PREFILLED_SYRINGE | INTRAMUSCULAR | Status: DC
  Administered 2021-09-16: 40 mg via SUBCUTANEOUS
  Filled 2021-09-15: qty 0.4

## 2021-09-15 MED ORDER — METHOCARBAMOL 500 MG PO TABS
750.0000 mg | ORAL_TABLET | Freq: Four times a day (QID) | ORAL | Status: DC | PRN
  Administered 2021-09-15 – 2021-09-16 (×3): 750 mg via ORAL
  Filled 2021-09-15 (×3): qty 2

## 2021-09-15 MED ORDER — METFORMIN HCL 500 MG PO TABS
1000.0000 mg | ORAL_TABLET | Freq: Two times a day (BID) | ORAL | Status: DC
  Administered 2021-09-16: 1000 mg via ORAL
  Filled 2021-09-15: qty 2

## 2021-09-15 MED ORDER — HYDROMORPHONE HCL 1 MG/ML IJ SOLN: 1.0000 mg | Freq: Once | INTRAMUSCULAR | Status: DC

## 2021-09-15 MED ORDER — ATORVASTATIN CALCIUM 80 MG PO TABS
80.0000 mg | ORAL_TABLET | Freq: Every day | ORAL | Status: DC
Start: 2021-09-15 — End: 2021-09-16
  Administered 2021-09-15: 80 mg via ORAL
  Filled 2021-09-15: qty 2

## 2021-09-15 MED ORDER — POTASSIUM CHLORIDE CRYS ER 10 MEQ PO TBCR
20.0000 meq | EXTENDED_RELEASE_TABLET | Freq: Every day | ORAL | Status: DC
  Administered 2021-09-15 – 2021-09-16 (×2): 20 meq via ORAL
  Filled 2021-09-15 (×2): qty 2

## 2021-09-15 MED ORDER — ONDANSETRON HCL 4 MG/2ML IJ SOLN: 4.0000 mg | Freq: Four times a day (QID) | INTRAMUSCULAR | Status: DC | PRN

## 2021-09-15 MED ORDER — HEPARIN (PORCINE) 25000 UT/250ML-% IV SOLN
1200.0000 [IU]/h | INTRAVENOUS | Status: DC
  Administered 2021-09-15: 1200 [IU]/h via INTRAVENOUS
  Filled 2021-09-15: qty 250

## 2021-09-15 MED ORDER — DILTIAZEM HCL ER COATED BEADS 180 MG PO CP24
180.0000 mg | ORAL_CAPSULE | Freq: Every day | ORAL | Status: DC
  Administered 2021-09-15 – 2021-09-16 (×2): 180 mg via ORAL
  Filled 2021-09-15 (×2): qty 1

## 2021-09-15 MED ORDER — ONDANSETRON HCL 4 MG PO TABS: 4.0000 mg | ORAL_TABLET | Freq: Four times a day (QID) | ORAL | Status: DC | PRN

## 2021-09-15 MED ORDER — HYDROMORPHONE HCL 1 MG/ML IJ SOLN
1.0000 mg | Freq: Once | INTRAMUSCULAR | Status: AC
  Administered 2021-09-15: 1 mg via INTRAVENOUS
  Filled 2021-09-15: qty 1

## 2021-09-15 MED ORDER — SENNOSIDES-DOCUSATE SODIUM 8.6-50 MG PO TABS
1.0000 | ORAL_TABLET | Freq: Every evening | ORAL | Status: DC | PRN
Start: 2021-09-15 — End: 2021-09-16

## 2021-09-15 MED ORDER — CARVEDILOL 12.5 MG PO TABS
12.5000 mg | ORAL_TABLET | Freq: Two times a day (BID) | ORAL | Status: DC
  Administered 2021-09-15 – 2021-09-16 (×2): 12.5 mg via ORAL
  Filled 2021-09-15 (×2): qty 1

## 2021-09-15 MED ORDER — DULOXETINE HCL 60 MG PO CPEP
60.0000 mg | ORAL_CAPSULE | Freq: Every day | ORAL | Status: DC
Start: 2021-09-15 — End: 2021-09-16
  Administered 2021-09-15 – 2021-09-16 (×2): 60 mg via ORAL
  Filled 2021-09-15: qty 1
  Filled 2021-09-15: qty 2

## 2021-09-15 MED ORDER — DAPAGLIFLOZIN PROPANEDIOL 10 MG PO TABS
10.0000 mg | ORAL_TABLET | Freq: Every day | ORAL | Status: DC
  Administered 2021-09-15 – 2021-09-16 (×2): 10 mg via ORAL
  Filled 2021-09-15 (×3): qty 1

## 2021-09-15 MED ORDER — BUPRENORPHINE HCL 900 MCG BU FILM
ORAL_FILM | Freq: Two times a day (BID) | BUCCAL | Status: DC
Start: 2021-09-15 — End: 2021-09-16

## 2021-09-15 MED ORDER — IOHEXOL 350 MG/ML SOLN
100.0000 mL | Freq: Once | INTRAVENOUS | Status: AC | PRN
  Administered 2021-09-15: 100 mL via INTRAVENOUS

## 2021-09-15 MED ORDER — HEPARIN BOLUS VIA INFUSION
4000.0000 [IU] | Freq: Once | INTRAVENOUS | Status: AC
  Administered 2021-09-15: 4000 [IU] via INTRAVENOUS
  Filled 2021-09-15: qty 4000

## 2021-09-15 MED ORDER — OXYCODONE HCL 5 MG PO TABS
20.0000 mg | ORAL_TABLET | Freq: Four times a day (QID) | ORAL | Status: DC | PRN
  Administered 2021-09-15 – 2021-09-16 (×4): 20 mg via ORAL
  Filled 2021-09-15 (×4): qty 4

## 2021-09-15 MED ORDER — MORPHINE SULFATE (PF) 4 MG/ML IV SOLN
4.0000 mg | Freq: Once | INTRAVENOUS | Status: AC
  Administered 2021-09-15: 4 mg via INTRAVENOUS
  Filled 2021-09-15: qty 1

## 2021-09-15 MED ORDER — NITROGLYCERIN 0.4 MG SL SUBL: 0.4000 mg | SUBLINGUAL_TABLET | SUBLINGUAL | Status: DC | PRN

## 2021-09-15 MED ORDER — LUBIPROSTONE 24 MCG PO CAPS
48.0000 ug | ORAL_CAPSULE | Freq: Every day | ORAL | Status: DC
  Administered 2021-09-15: 48 ug via ORAL
  Filled 2021-09-15 (×2): qty 2

## 2021-09-15 MED ORDER — HYDROCODONE-ACETAMINOPHEN 5-325 MG PO TABS
1.0000 | ORAL_TABLET | Freq: Four times a day (QID) | ORAL | Status: DC | PRN
  Filled 2021-09-15: qty 1

## 2021-09-15 MED ORDER — PREGABALIN 75 MG PO CAPS
150.0000 mg | ORAL_CAPSULE | Freq: Two times a day (BID) | ORAL | Status: DC
  Administered 2021-09-15 – 2021-09-16 (×2): 150 mg via ORAL
  Filled 2021-09-15 (×2): qty 2

## 2021-09-15 MED ORDER — GLIPIZIDE ER 10 MG PO TB24
10.0000 mg | ORAL_TABLET | Freq: Every day | ORAL | Status: DC
Start: 2021-09-16 — End: 2021-09-16
  Administered 2021-09-16: 10 mg via ORAL
  Filled 2021-09-15: qty 1

## 2021-09-15 NOTE — Progress Notes (Addendum)
  ANTICOAGULATION CONSULT NOTE  Pharmacy Consult for heparin Indication: chest pain/ACS  Allergies  Allergen Reactions   Brilinta [Ticagrelor] Shortness Of Breath and Other (See Comments)    CHEST PAIN    Methylprednisolone Other (See Comments)    DIAPHORESIS, HYPOTENSION   Prednisone Other (See Comments)    DIAPHORESIS, HYPOTENSION   Toradol [Ketorolac Tromethamine] Other (See Comments)    DIAPHORESIS, HYPOTENSION     Adhesive [Tape] Rash and Other (See Comments)    "blisters" ( NO SURGICAL TAPE, PLEASE)    Patient Measurements:   Heparin Dosing Weight: 93kg  Vital Signs: Temp: 97.8 F (36.6 C) (07/23 1713) Temp Source: Oral (07/23 1713) BP: 120/87 (07/23 1830) Pulse Rate: 89 (07/23 1830)  Labs: Recent Labs    09/15/21 0900 09/15/21 1040 09/15/21 1807  HGB 12.2*  --   --   HCT 38.4*  --   --   PLT 222  --   --   HEPARINUNFRC  --   --  0.40  CREATININE 0.94  --   --   TROPONINIHS 175* 175*  --     CrCl cannot be calculated (Unknown ideal weight.).   Assessment: 53 YOM presenting with CP and back pain worsening, hx CAD, elevated troponin.  He is not on anticoagulation PTA, CBC wnl.  Heparin level therapeutic (0.4) on infusion at 1200 units/hr. No bleeding noted. Per cards, elevated troponins multiple times on previous ER visits. Plan to observe overnight.  Goal of Therapy:  Heparin level 0.3-0.7 units/ml Monitor platelets by anticoagulation protocol: Yes   Plan:  Continue heparin at 1200 units/hr F/u confirmatory heparin level in the a.m. F/u cards eval and recs  Sherlon Handing, PharmD, BCPS Please see amion for complete clinical pharmacist phone list 09/15/2021 6:58 PM  ADDENDUM: MD d/cing heparin as doesn't seem to be ACS.  Sherlon Handing, PharmD, BCPS Please see amion for complete clinical pharmacist phone list 09/15/2021 7:05 PM

## 2021-09-15 NOTE — Hospital Course (Signed)
Admitted 09/15/2021  Allergies: Brilinta [ticagrelor], Methylprednisolone, Prednisone, Toradol [ketorolac tromethamine], and Adhesive [tape] Pertinent Hx:  CAD, NSTEMI, systolic and diastolic HF, ischemic cardiomyopathy, T2DM, HTN, HLD, chronic back pain s/p spinal fusions and spinal cord stimulator   66 y.o. male p/w sudden onset of back pain/chest pain  * Atypical chest pain: Initial thought to be ACS. Trops flat, EKG reassuring. Likely MSK-related. Started on robaxin and chronic opioids  Consults: Cards  Meds: Pain meds, lyrica, dilt, coreg VTE ppx: Lovenox IVF: N/A Diet: HH

## 2021-09-15 NOTE — Consult Note (Addendum)
Cardiology Consultation:   Patient ID: Thomas Mcclure MRN: [id number]; DOB: 1956-01-20  Admit date: 09/15/2021 Date of Consult: 09/15/2021  PCP:  Fredrich Romans, [facility] Providers Cardiologist:  Sinclair Grooms, MD        Patient Profile:   Thomas Mcclure is a 66 y.o. male with a hx of CAD with NSTEMI 2017 s/p DES to RCA, chronic combined CHF, ICM,  DM, HTN, HLD, chronic back pain s/p spinal cord stimulator, GERD, RA who is being seen 09/15/2021 for the evaluation of elevated troponin at the request of Dr. Colvin Caroli.  History of Present Illness:   Mr. Thomas Mcclure was admitted in 06/2015 with NSTEMI, found to have occluded pRCA tx with aspiration thrombectomy and DES. 2D echo showed mild LVH with normal LVEF, moderate RVE and hypocontractility of the RV apex. He was readmitted 07/2015 with symptoms of unstable angina and minimally elevated troponin levels without clear trend. LHC demonstrated patent RCA stent and no significant CAD elsewhere, otherwise OM1 50% stenosis. 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 Since that time he has had other encounters in the medical system with marginally elevated troponin of unclear significance, last in 2018. Last cath 01/2017 showed patent RCA stent and otherwise nonobstructive CAD, normal LVEDP, EF 50-55%. Last echo 09/2018 EF 45-50%, impaired relaxation, abnormal septal motion inferior basal hypokinesis, aortic sclerosis without stenosis. He is on chronic pain medications with oxycodone, buprenorphine, and Lyrica.   He presented to the emergency room with persistent left sided and posterior back pain which started abruptly after lifting a tire while he was trying to change it. Since that time it has never fully resolved but would wax and wane. It is specifically aggravated by very specific movements including stretching upwards, standing, and was especially aggravated by the bumps on the road in the car  over. There is also a pleuritic component he feels in his back. No SOB, nausea, vomiting, diaphoresis, palpitations, fevers, chills, syncope. He had a similar brief episode a few weeks ago following a fall where he thought he'd cracked a rib. He did not try anything for the symptoms at home aside from his usual pain regimen. Upon arrival to ED, VSS. Nursing notes indicate that patient was verbally abusive to staff upon arrival then apologetic and tearful. CXR NAD. Labs notable for BNP 10, hsTroponin 175->175, Hgb 12.2 (generally c/w prior). CT angio chest/abd/pelvis showed no evidence of dissection or PE. There is aortic and coronary calcification as well as COPD and fatty liver but otherwise no acute findings. He received IV dilaudid. He and his wife are requesting additional pain control at this time.   Past Medical History:  Diagnosis Date   Baker's cyst    Left calf   CAD in native artery, 07/15/15 PCI of RCA with DES 07/16/2015   a.   NSTEMI 5/17: LHC - pLAD 20, pLCx 20, OM1 30, pRCA 100, EF 45-50%>> PCI: 2.5 x 24 mm Promus DES to RCA  //  b.   Echo 5/17: mild LVH, EF 50-55%, no RWMA, mod RVE //  c. LHC 6/17: pLAD 20, pLCx 20, OM1 50, pRCA stent ok, EF 35-45% with mod sized inf wall and basal segment aneurysm   CHF (congestive heart failure) (HCC)    Chronic back pain    "down my back, down my legs" (02/18/2017)   Constipation    Constipation due to opioid therapy    GERD (gastroesophageal  reflux disease)    04/07/2019- not current   Hyperlipidemia    Hypertension    Dr. [name] [phone]   Ischemic cardiomyopathy    a. LV-gram at time of LHC in 6/17 with EF 35-45%  //  b. 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"   Numbness and tingling of both lower extremities    "on the outside of both sides" (02/18/2017)    Rheumatoid arthritis (HCC)    RA   Tachycardia    Type II diabetes mellitus ([place])    diet controlled    Past Surgical History:  Procedure Laterality Date   BACK SURGERY     x3   bilateral cataract curgery      CARDIAC CATHETERIZATION N/A 07/15/2015   Procedure: Left Heart Cath and Coronary Angiography;  Surgeon: [name], MD;  Location: [facility];  Service: Cardiovascular;  Laterality: N/A;   CARDIAC CATHETERIZATION N/A 07/15/2015   Procedure: Coronary Stent Intervention;  Surgeon: [name], MD;  Location: [facility];  Service: Cardiovascular;  Laterality: N/A;   CARDIAC CATHETERIZATION N/A 08/07/2015   Procedure: Left Heart Cath and Coronary Angiography;  Surgeon: [name], MD;  Location: [facility];  Service: Cardiovascular;  Laterality: N/A;   CORONARY ANGIOPLASTY WITH STENT PLACEMENT  07/2015   LEFT HEART CATH AND CORONARY ANGIOGRAPHY N/A 02/19/2017   Procedure: LEFT HEART CATH AND CORONARY ANGIOGRAPHY;  Surgeon: Martinique, Peter M, MD;  Location: [facility];  Service: Cardiovascular;  Laterality: N/A;   LUMBAR FUSION  04/11/2019   REVISION OF THORACOLUMBAR FUSION    LUMBAR SPINE SURGERY  03/2009  & 2012   MASS EXCISION N/A 04/19/2012   Procedure: removal of posterior cervical lipoma;  Surgeon: [name], MD;  Location: [facility];  Service: Neurosurgery;  Laterality: N/A;  Removal of posterior cervical lipoma   POPLITEAL SYNOVIAL CYST EXCISION Left    "opened up behind my knee; scraped out arthritis"   POSTERIOR LUMBAR FUSION 4 LEVEL N/A 11/22/2018   Procedure: Posterior Lateral and Interbody fusion - Lumbar one-Lumbar two; explore fusion, posterior instrumentation and fusion Thoracic ten to the ilium;  Surgeon: Newman Pies, MD;  Location: [facility];  Service: Neurosurgery;  Laterality: N/A;   SPINAL CORD STIMULATOR INSERTION N/A 01/07/2013   Procedure:  SPINAL CORD STIMULATOR INSERTION;  Surgeon: [name], MD;   Location: [facility];  Service: Neurosurgery;  Laterality: N/A;   SPINAL CORD STIMULATOR INSERTION N/A 02/09/2019   Procedure: REPLACEMENT OF LUMBAR SPINAL CORD STIMULATOR;  Surgeon: Newman Pies, MD;  Location: [facility];  Service: Neurosurgery;  Laterality: N/A;  Thoracic/Lumbar spine   TONSILLECTOMY     patient denies   TOTAL HIP ARTHROPLASTY Right 10/03/2016   Procedure: TOTAL HIP ARTHROPLASTY;  Surgeon: [name], MD;  Location: [facility];  Service: Orthopedics;  Laterality: Right;   TOTAL KNEE ARTHROPLASTY Right 11/09/2020   Procedure: TOTAL KNEE ARTHROPLASTY;  Surgeon: [name], MD;  Location: [facility];  Service: Orthopedics;  Laterality: Right;   WISDOM TOOTH EXTRACTION     hx of     Home Medications:  Prior to Admission medications   Medication Sig Start Date End Date Taking? Authorizing Provider  ACCU-CHEK AVIVA PLUS test strip  04/30/19   [provider]  Accu-Chek Softclix Lancets lancets 4 (four) times daily. 07/12/20   [provider]  acetaminophen (TYLENOL) 500 MG tablet Take 1,000 mg by mouth 2 (two) times daily as needed (pain.).    [provider]  acetaminophen (TYLENOL) 500 MG tablet Take 2 tablets (1,000 mg total) by mouth every 8 (eight) hours. 11/11/20   Britt Bottom, PA-C  atorvastatin (LIPITOR) 80 MG tablet Take 80 mg by mouth daily at 6 PM.    [provider]  Buprenorphine HCl (BELBUCA) 900 MCG FILM Dissolve 1 (one) film inside cheeks two times daily. 12/06/20     Buprenorphine HCl (BELBUCA) 900 MCG FILM Dissolve 1 film inside cheeks 2 times daily 01/03/21     Buprenorphine HCl (BELBUCA) 900 MCG FILM Dissolve 1 film inside cheeks two times daily 01/03/21     Buprenorphine HCl (BELBUCA) 900 MCG FILM Place 1 film inside cheeks 2 times daily 01/30/21     Buprenorphine HCl (BELBUCA) 900 MCG FILM Place 1 film inside cheek two times daily as directed 03/01/21     Buprenorphine HCl (BELBUCA) 900 MCG FILM Dissolve 1 film inside  cheeks two times daily 04/01/21     Buprenorphine HCl (BELBUCA) 900 MCG FILM Place 1 (one) Film inside cheeks two times daily 04/29/21     Buprenorphine HCl (BELBUCA) 900 MCG FILM Place 1 film inside cheeks two times daily 05/30/21     Buprenorphine HCl (BELBUCA) 900 MCG FILM Place 1 film inside the cheek 2 times a day as directed. 06/27/21     Buprenorphine HCl (BELBUCA) 900 MCG FILM Place 1 film inside cheeks 2 times daily 07/25/21     Buprenorphine HCl (BELBUCA) 900 MCG FILM Place 1 Film inside cheeks two times daily 08/22/21     carvedilol (COREG) 12.5 MG tablet TAKE 1 TABLET BY MOUTH TWICE DAILY WITH A MEAL 08/20/21   [name], MD  chlorthalidone (HYGROTON) 25 MG tablet TAKE 1 TABLET BY MOUTH DAILY 03/08/21   [name], MD  cyclobenzaprine (FLEXERIL) 10 MG tablet Take 1 tablet (10 mg total) by mouth 3 (three) times daily as needed for muscle spasms. 02/10/19   Viona Gilmore D, NP  dapagliflozin propanediol (FARXIGA) 10 MG TABS tablet Take 10 mg by mouth daily.    [provider]  dapagliflozin propanediol (FARXIGA) 10 MG TABS tablet Take 1 tablet by mouth daily 08/19/21     diltiazem (DILACOR XR) 180 MG 24 hr capsule Take 180 mg by mouth daily.    [provider]  diltiazem (DILT-XR) 180 MG 24 hr capsule Take 1 capsule by mouth daily 05/02/21     docusate sodium (COLACE) 100 MG capsule Take 1 capsule (100 mg total) by mouth 2 (two) times daily. 02/10/19   Viona Gilmore D, NP  DULoxetine (CYMBALTA) 60 MG capsule Take 1 capsule (60 mg total) by mouth daily. 11/06/17   Marrian Salvage, FNP  DULoxetine (CYMBALTA) 60 MG capsule Take 1 capsule by mouth daily 08/19/21     glipiZIDE (GLUCOTROL XL) 10 MG 24 hr tablet Take 20 mg by mouth daily with breakfast.    [provider]  glipiZIDE (GLUCOTROL) 10 MG tablet TAKE 1 TABLET BY MOUTH DAILY Patient taking differently: Take 10 mg by mouth daily. 03/20/20 03/20/21  Fredrich Romans, PA  lisinopril (PRINIVIL,ZESTRIL) 20 MG  tablet Take 20 mg by mouth at bedtime.  07/07/16   [provider]  lisinopril (ZESTRIL) 20 MG tablet Take 1 tablet (20 mg total) by mouth  daily. 08/19/21     lubiprostone (AMITIZA) 24 MCG capsule Take 48 mcg by mouth at bedtime.     [provider]  lubiprostone (AMITIZA) 24 MCG capsule Take 2 capsules by mouth at bedtime 05/02/21     lubiprostone (AMITIZA) 24 MCG capsule Take 2 capsules (48 mcg total) by mouth at bedtime. 08/19/21     metFORMIN (GLUCOPHAGE XR) 500 MG 24 hr tablet Take 2 tablets (1,000 mg total) by mouth 2 (two) times daily. 11/09/17   Marrian Salvage, FNP  metFORMIN (GLUCOPHAGE) 1000 MG tablet Take 1,000 mg by mouth 2 (two) times daily with a meal.    [provider]  Multiple Vitamin (MULTIVITAMIN WITH MINERALS) TABS tablet Take 1 tablet by mouth daily.    [provider]  omeprazole (PRILOSEC) 40 MG capsule Take 1 capsule (40 mg total) by mouth daily. 11/06/17   Marrian Salvage, FNP  omeprazole (PRILOSEC) 40 MG capsule Take 1 capsule by mouth daily 08/19/21     oxyCODONE (OXY IR/ROXICODONE) 5 MG immediate release tablet Take one tab po q4-6hrs prn pain breakthrough pain beyond your normal pain med. 11/09/20   Chadwell, Vonna Kotyk, PA-C  Oxycodone HCl 20 MG TABS Take 1 (one) Tablet by mouth every six hours, as needed 12/06/20     Oxycodone HCl 20 MG TABS Take 1 tablet by mouth every six hours as needed 01/03/21     Oxycodone HCl 20 MG TABS Take 1 tablet (20 mg total) by mouth every 6 (six) hours as needed. 01/30/21     Oxycodone HCl 20 MG TABS Take 1 tablet (20 mg total) by mouth every 6 (six) hours as needed. 06/27/21     Oxycodone HCl 20 MG TABS Take 1 tablet by mouth every 6 hours, as needed 08/22/21     polyethylene glycol powder (GLYCOLAX/MIRALAX) powder Take 17 g by mouth daily. Mix in 8 oz water, juice, soda, coffee or tea and drink 11/06/17   Marrian Salvage, FNP  Potassium Chloride ER 20 MEQ TBCR Take 1 tablet by mouth daily 05/02/21      potassium chloride SA (KLOR-CON) 20 MEQ tablet Take 1 tablet (20 mEq total) by mouth daily. 03/07/20   [name], MD  pregabalin (LYRICA) 150 MG capsule Take 1 capsule by mouth two times daily 01/03/21     pregabalin (LYRICA) 150 MG capsule Take 1 capsule by mouth 2 times daily. 01/30/21     pregabalin (LYRICA) 150 MG capsule Take 1 capsule by mouth 2 times daily 03/01/21     pregabalin (LYRICA) 150 MG capsule Take 1 (one) Capsule by mouth two times daily 04/29/21     pregabalin (LYRICA) 150 MG capsule Take 1 capsule by mouth 2 times daily 05/30/21     pregabalin (LYRICA) 150 MG capsule Take 1 capsule by mouth 2 times a day 06/27/21     pregabalin (LYRICA) 150 MG capsule Take 1 capsule by mouth two times daily 07/25/21     pregabalin (LYRICA) 150 MG capsule Take 1 capsule by mouth two times daily 08/22/21     senna-docusate (SENOKOT S) 8.6-50 MG tablet Take 1 tablet by mouth 2 (two) times daily. 05/31/17   Eugenie Filler, MD  sildenafil (VIAGRA) 100 MG tablet Take 100 mg by mouth as needed for erectile dysfunction.    [provider]    Inpatient Medications: Scheduled Meds:  Continuous Infusions:  PRN Meds: nitroGLYCERIN  Allergies:    Allergies  Allergen Reactions   Brilinta [  Ticagrelor] Shortness Of Breath and Other (See Comments)    CHEST PAIN    Methylprednisolone Other (See Comments)    DIAPHORESIS, HYPOTENSION   Prednisone Other (See Comments)    DIAPHORESIS, HYPOTENSION   Toradol [Ketorolac Tromethamine] Other (See Comments)    DIAPHORESIS, HYPOTENSION     Adhesive [Tape] Rash and Other (See Comments)    "blisters" ( NO SURGICAL TAPE, PLEASE)    Social History:   Social History   Socioeconomic History   Marital status: Married    Spouse name: Not on file   Number of children: 5   Years of education: Not on file   Highest education level: Not on file  Occupational History   Occupation: Development worker, community, now on disability due to back pain  Tobacco  Use   Smoking status: Former    Packs/day: 0.25    Years: 40.00    Total pack years: 10.00    Types: Cigarettes    Quit date: 2008    Years since quitting: 15.5   Smokeless tobacco: Never   Tobacco comments:    02/18/2017 "quit in 2012"  Vaping Use   Vaping Use: Never used  Substance and Sexual Activity   Alcohol use: No    Alcohol/week: 0.0 standard drinks of alcohol   Drug use: No   Sexual activity: Not Currently  Other Topics Concern   Not on file  Social History Narrative   Not on file   Social Determinants of Health   Financial Resource Strain: Not on file  Food Insecurity: Not on file  Transportation Needs: Not on file  Physical Activity: Not on file  Stress: Not on file  Social Connections: Not on file  Intimate Partner Violence: Not on file    Family History:    Family History  Problem Relation Age of Onset   Heart disease Mother    Prostate cancer Brother      ROS:  Please see the history of present illness.  All other ROS reviewed and negative.     Physical Exam/Data:   Vitals:   09/15/21 1000 09/15/21 1020 09/15/21 1030 09/15/21 1040  BP: 133/86 111/87 119/83 (!) 117/92  Pulse: 85 81 77 79  Resp: '14 13 11 14  '$ Temp:      TempSrc:      SpO2: 97% 98% 98% 96%   No intake or output data in the 24 hours ending 09/15/21 1109    11/09/2020    8:19 AM 11/01/2020    9:39 AM 10/01/2020    9:51 AM  Last 3 Weights  Weight (lbs) 251 lb 6.5 oz 252 lb 254 lb  Weight (kg) 114.036 kg 114.306 kg 115.214 kg     There is no height or weight on file to calculate BMI.  General: Well developed, well nourished, in no acute distress. Head: Normocephalic, atraumatic, sclera non-icteric, no xanthomas, nares are without discharge. Neck: Negative for carotid bruits. JVP not elevated. Lungs: Clear bilaterally to auscultation without wheezes, rales, or rhonchi. Breathing is unlabored. Heart: RRR S1 S2 without murmurs, rubs, or gallops.  Abdomen: Soft, non-tender,  non-distended with normoactive bowel sounds. No rebound/guarding. Extremities: No clubbing or cyanosis. No edema. Distal pedal pulses are 2+ and equal bilaterally. Neuro: Alert and oriented X 3. Moves all extremities spontaneously. Psych:  Responds to questions appropriately with a normal affect.   EKG:  The EKG was personally reviewed and demonstrates:  NSR 87bpm, known inferior Q waves, nonspecific STTW changes, unchanged from prior  Telemetry:  Telemetry was personally reviewed and demonstrates:  NSR  Relevant CV Studies: 2D echo 09/2018    1. The left ventricle has mildly reduced systolic function, with an  ejection fraction of 45-50%. The cavity size was normal. There is mildly  increased left ventricular wall thickness. Left ventricular diastolic  Doppler parameters are consistent with  impaired relaxation.   2. Abnormal septal motion inferior basal hypokinesis.   3. The right ventricle has normal systolic function. The cavity was  normal. There is no increase in right ventricular wall thickness.   4. Mild thickening of the mitral valve leaflet. Mild calcification of the  mitral valve leaflet. There is mild mitral annular calcification present.   5. The aortic valve is tricuspid. Mild thickening of the aortic valve.  Sclerosis without any evidence of stenosis of the aortic valve.   6. The aorta is normal unless otherwise noted.   7. The interatrial septum was not well visualized.   [place] 2018 Prox LAD to Mid LAD lesion is 20% stenosed. Prox Cx to Mid Cx lesion is 20% stenosed. Ost 1st Mrg to 1st Mrg lesion is 50% stenosed. Non-stenotic Prox RCA lesion previously treated. The left ventricular systolic function is normal. LV end diastolic pressure is normal. The left ventricular ejection fraction is 50-55% by visual estimate.   1. Nonobstructive CAD. Patent stent in the RCA.  2. Overall good LV function 3. Normal LVEDP   Plan: continue medical therapy  Laboratory  Data:  High Sensitivity Troponin:   Recent Labs  Lab 09/15/21 0900  TROPONINIHS 175*     Chemistry Recent Labs  Lab 09/15/21 0900  NA 139  K 4.0  CL 105  CO2 26  GLUCOSE 110*  BUN 16  CREATININE 0.94  CALCIUM 9.2  GFRNONAA >60  ANIONGAP 8    No results for input(s): "PROT", "ALBUMIN", "AST", "ALT", "ALKPHOS", "BILITOT" in the last 168 hours. Lipids No results for input(s): "CHOL", "TRIG", "HDL", "LABVLDL", "LDLCALC", "CHOLHDL" in the last 168 hours.  Hematology Recent Labs  Lab 09/15/21 0900  WBC 6.9  RBC 4.79  HGB 12.2*  HCT 38.4*  MCV 80.2  MCH 25.5*  MCHC 31.8  RDW 16.9*  PLT 222   Thyroid No results for input(s): "TSH", "FREET4" in the last 168 hours.  BNP Recent Labs  Lab 09/15/21 0905  BNP 10.7    DDimer No results for input(s): "DDIMER" in the last 168 hours.   Radiology/Studies:  DG Chest Port 1 View  Result Date: 09/15/2021 CLINICAL DATA:  Chest pain EXAM: PORTABLE CHEST 1 VIEW COMPARISON:  04/19/2018 FINDINGS: Normal heart size and mediastinal contours. Low volume chest. There is no edema, consolidation, effusion, or pneumothorax. Spinal fusion and spinal cord stimulator leads. IMPRESSION: No evidence of active disease. Electronically Signed   By: Jorje Guild M.D.   On: 09/15/2021 09:27     Assessment and Plan:   1. Chest pain, atypical, with elevated troponin - quality of chest pain/back pain sounds MSK in etiology - CT negative for dissection, PE, fluid, effusions - hsTroponin elevated at 175->175 of unclear significance - has known hx of prior chronic marginal elevation in the past with last cath in 2018 demonstrating patent stent and nonobstructive disease otherwise - ASA not on home med list- would recommend to add back if OK with ER team and no additional MSK procedures planned - will discuss further recommendations with Dr. Acie Fredrickson  2. CAD s/p prior PCI 2018 - home med list has not  yet been reconciled but would suggest to continue  prior cardiac regimen at last OV which included ASA '81mg'$  daily, carvedilol 12.'5mg'$  BID, atorvastatin '80mg'$  daily (would also continue diltiazem, chlorthalidone, and lisinopril if confirmed taking prior to admission)  3. Chronic HFrEF - appears euvolemic on exam - continue outpatient follow-up  4. Essential HTN - BP controlled  5. Chronic pain  - per ED team  Tentatively arranged f/u 09/25/21 (I looked but there was no availability until later Aug with prior APPs previously familiar with pt). Dispo pending MD evaluation.  Risk Assessment/Risk Scores:       For questions or updates, please contact [facility] Please consult [email or web address] for contact info under    Signed, Charlie Pitter, PA-C  09/15/2021 11:09 AM  Attending Note:   The patient was seen and examined.  Agree with assessment and plan as noted above.  Changes made to the above note as needed.  Patient seen and independently examined with Melina Copa, PA .   We discussed all aspects of the encounter. I agree with the assessment and plan as stated above.     MSK pain :   Patient developed severe chest pain left-sided pain when he was lifting up a tire.  CT scan shows that there is no evidence of aortic dissection.  There is no pericardial effusion. I suspect that he has a strain of his ribs or connective tissue.  EKG is unchanged.  His troponin elevation is very minimal.  Of note is that he has had troponin elevations numerous times on previous ER visits.  He will follow-up with [name] at his previously scheduled appointment. I will defer to the emergency room for treatment of his musculoskeletal pain.    I have spent a total of 40 minutes with patient reviewing hospital  notes , telemetry, EKGs, labs and examining patient as well as establishing an assessment and plan that was discussed with the patient.  > 50% of time was spent in direct patient care.    Thayer Headings, Brooke Bonito., MD, [facility] 09/15/2021, 1:17 PM [address],  [place] Pager [phone]

## 2021-09-15 NOTE — ED Notes (Signed)
Pt continues to be verbally abusive towards staff. RN explained that he is here so we can help him. Wife is now at bedside. Pt is arguing with wife at bedside and wife states "she will handle it".

## 2021-09-15 NOTE — Progress Notes (Signed)
  ANTICOAGULATION CONSULT NOTE - Initial Consult  Pharmacy Consult for heparin Indication: chest pain/ACS  Allergies  Allergen Reactions   Brilinta [Ticagrelor] Shortness Of Breath and Other (See Comments)    CHEST PAIN    Methylprednisolone Other (See Comments)    DIAPHORESIS, HYPOTENSION   Prednisone Other (See Comments)    DIAPHORESIS, HYPOTENSION   Toradol [Ketorolac Tromethamine] Other (See Comments)    DIAPHORESIS, HYPOTENSION     Adhesive [Tape] Rash and Other (See Comments)    "blisters" ( NO SURGICAL TAPE, PLEASE)    Patient Measurements:   Heparin Dosing Weight: 93kg  Vital Signs: Temp: 97.5 F (36.4 C) (07/23 0830) Temp Source: Oral (07/23 0830) BP: 117/92 (07/23 1040) Pulse Rate: 79 (07/23 1040)  Labs: Recent Labs    09/15/21 0900 09/15/21 1040  HGB 12.2*  --   HCT 38.4*  --   PLT 222  --   CREATININE 0.94  --   TROPONINIHS 175* 175*    CrCl cannot be calculated (Unknown ideal weight.).   Medical History: Past Medical History:  Diagnosis Date   Baker's cyst    Left calf   CAD in native artery, 07/15/15 PCI of RCA with DES 07/16/2015   a.   NSTEMI 5/17: LHC - pLAD 20, pLCx 20, OM1 30, pRCA 100, EF 45-50%>> PCI: 2.5 x 24 mm Promus DES to RCA  //  b.   Echo 5/17: mild LVH, EF 50-55%, no RWMA, mod RVE //  c. LHC 6/17: pLAD 20, pLCx 20, OM1 50, pRCA stent ok, EF 35-45% with mod sized inf wall and basal segment aneurysm   Chronic back pain    "down my back, down my legs" (02/18/2017)   Chronic combined systolic and diastolic HF (heart failure) (HCC)    Constipation    Constipation due to opioid therapy    GERD (gastroesophageal reflux disease)    04/07/2019- not current   Hyperlipidemia    Hypertension    Dr. [name] [phone]   Ischemic cardiomyopathy    a. LV-gram at time of LHC in 6/17 with EF 35-45%  //  b. 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  He is not on anticoagulation PTA, CBC wnl.  Goal of Therapy:  Heparin level 0.3-0.7 units/ml Monitor platelets by anticoagulation protocol: Yes   Plan:  Heparin 4000 units IV x 1, and gtt at 1200 units/hr F/u 6 hour heparin level F/u cards eval and recs  Bertis Ruddy, PharmD Clinical Pharmacist ED Pharmacist Phone # [phone] 09/15/2021 11:56 AM

## 2021-09-15 NOTE — ED Notes (Signed)
Pt is apologetic and tearful

## 2021-09-15 NOTE — ED Provider Notes (Cosign Needed Addendum)
[facility] Provider Note   CSN: [id number] Arrival date & time: 09/15/21  0759     History  Chief Complaint  Patient presents with   Chest Pain    [facility] is a 66 y.o. male.   Chest Pain   Patient with medical history of CAD PCI of RCA 07/15/2015, CHF, chronic back pain, hypertension, hyperlipidemia, ischemic cardiomyopathy, type 2 diabetes presents today with chest pain and back pain.  Started on Friday when he was lifting heavy tire.  The pain is in the thoracic mid back and feels like a tearing pain. He is also having left-sided chest pain which is worse with any inspiration.  The pain itself feels sharp, it is constant but the severity changes based on provocation.  He feels short of breath but denies any nausea or vomiting.  He states this feels similar to his previous MI.    He came via EMS, he had 325 baby aspirin in route.  Home Medications Prior to Admission medications   Medication Sig Start Date End Date Taking? Authorizing Provider  ACCU-CHEK AVIVA PLUS test strip  04/30/19   [provider]  Accu-Chek Softclix Lancets lancets 4 (four) times daily. 07/12/20   [provider]  acetaminophen (TYLENOL) 500 MG tablet Take 1,000 mg by mouth 2 (two) times daily as needed (pain.).    [provider]  acetaminophen (TYLENOL) 500 MG tablet Take 2 tablets (1,000 mg total) by mouth every 8 (eight) hours. 11/11/20   Britt Bottom, PA-C  atorvastatin (LIPITOR) 80 MG tablet Take 80 mg by mouth daily at 6 PM.    [provider]  Buprenorphine HCl (BELBUCA) 900 MCG FILM Dissolve 1 (one) film inside cheeks two times daily. 12/06/20     Buprenorphine HCl (BELBUCA) 900 MCG FILM Dissolve 1 film inside cheeks 2 times daily 01/03/21     Buprenorphine HCl (BELBUCA) 900 MCG FILM Dissolve 1 film inside cheeks two times daily 01/03/21     Buprenorphine HCl (BELBUCA) 900 MCG FILM Place 1 film inside cheeks 2 times  daily 01/30/21     Buprenorphine HCl (BELBUCA) 900 MCG FILM Place 1 film inside cheek two times daily as directed 03/01/21     Buprenorphine HCl (BELBUCA) 900 MCG FILM Dissolve 1 film inside cheeks two times daily 04/01/21     Buprenorphine HCl (BELBUCA) 900 MCG FILM Place 1 (one) Film inside cheeks two times daily 04/29/21     Buprenorphine HCl (BELBUCA) 900 MCG FILM Place 1 film inside cheeks two times daily 05/30/21     Buprenorphine HCl (BELBUCA) 900 MCG FILM Place 1 film inside the cheek 2 times a day as directed. 06/27/21     Buprenorphine HCl (BELBUCA) 900 MCG FILM Place 1 film inside cheeks 2 times daily 07/25/21     Buprenorphine HCl (BELBUCA) 900 MCG FILM Place 1 Film inside cheeks two times daily 08/22/21     carvedilol (COREG) 12.5 MG tablet TAKE 1 TABLET BY MOUTH TWICE DAILY WITH A MEAL 08/20/21   [name], MD  chlorthalidone (HYGROTON) 25 MG tablet TAKE 1 TABLET BY MOUTH DAILY 03/08/21   [name], MD  cyclobenzaprine (FLEXERIL) 10 MG tablet Take 1 tablet (10 mg total) by mouth 3 (three) times daily as needed for muscle spasms. 02/10/19   Viona Gilmore D, NP  dapagliflozin propanediol (FARXIGA) 10 MG TABS tablet Take 10 mg by mouth daily.    [provider]  dapagliflozin propanediol (  FARXIGA) 10 MG TABS tablet Take 1 tablet by mouth daily 08/19/21     diltiazem (DILACOR XR) 180 MG 24 hr capsule Take 180 mg by mouth daily.    [provider]  diltiazem (DILT-XR) 180 MG 24 hr capsule Take 1 capsule by mouth daily 05/02/21     docusate sodium (COLACE) 100 MG capsule Take 1 capsule (100 mg total) by mouth 2 (two) times daily. 02/10/19   Viona Gilmore D, NP  DULoxetine (CYMBALTA) 60 MG capsule Take 1 capsule (60 mg total) by mouth daily. 11/06/17   Marrian Salvage, FNP  DULoxetine (CYMBALTA) 60 MG capsule Take 1 capsule by mouth daily 08/19/21     glipiZIDE (GLUCOTROL XL) 10 MG 24 hr tablet Take 20 mg by mouth daily with breakfast.    [provider]   glipiZIDE (GLUCOTROL) 10 MG tablet TAKE 1 TABLET BY MOUTH DAILY Patient taking differently: Take 10 mg by mouth daily. 03/20/20 03/20/21  Fredrich Romans, PA  lisinopril (PRINIVIL,ZESTRIL) 20 MG tablet Take 20 mg by mouth at bedtime.  07/07/16   [provider]  lisinopril (ZESTRIL) 20 MG tablet Take 1 tablet (20 mg total) by mouth daily. 08/19/21     lubiprostone (AMITIZA) 24 MCG capsule Take 48 mcg by mouth at bedtime.     [provider]  lubiprostone (AMITIZA) 24 MCG capsule Take 2 capsules by mouth at bedtime 05/02/21     lubiprostone (AMITIZA) 24 MCG capsule Take 2 capsules (48 mcg total) by mouth at bedtime. 08/19/21     metFORMIN (GLUCOPHAGE XR) 500 MG 24 hr tablet Take 2 tablets (1,000 mg total) by mouth 2 (two) times daily. 11/09/17   Marrian Salvage, FNP  metFORMIN (GLUCOPHAGE) 1000 MG tablet Take 1,000 mg by mouth 2 (two) times daily with a meal.    [provider]  Multiple Vitamin (MULTIVITAMIN WITH MINERALS) TABS tablet Take 1 tablet by mouth daily.    [provider]  omeprazole (PRILOSEC) 40 MG capsule Take 1 capsule (40 mg total) by mouth daily. 11/06/17   Marrian Salvage, FNP  omeprazole (PRILOSEC) 40 MG capsule Take 1 capsule by mouth daily 08/19/21     oxyCODONE (OXY IR/ROXICODONE) 5 MG immediate release tablet Take one tab po q4-6hrs prn pain breakthrough pain beyond your normal pain med. 11/09/20   Chadwell, Vonna Kotyk, PA-C  Oxycodone HCl 20 MG TABS Take 1 (one) Tablet by mouth every six hours, as needed 12/06/20     Oxycodone HCl 20 MG TABS Take 1 tablet by mouth every six hours as needed 01/03/21     Oxycodone HCl 20 MG TABS Take 1 tablet (20 mg total) by mouth every 6 (six) hours as needed. 01/30/21     Oxycodone HCl 20 MG TABS Take 1 tablet (20 mg total) by mouth every 6 (six) hours as needed. 06/27/21     Oxycodone HCl 20 MG TABS Take 1 tablet by mouth every 6 hours, as needed 08/22/21     polyethylene glycol powder (GLYCOLAX/MIRALAX)  powder Take 17 g by mouth daily. Mix in 8 oz water, juice, soda, coffee or tea and drink 11/06/17   Marrian Salvage, FNP  Potassium Chloride ER 20 MEQ TBCR Take 1 tablet by mouth daily 05/02/21     potassium chloride SA (KLOR-CON) 20 MEQ tablet Take 1 tablet (20 mEq total) by mouth daily. 03/07/20   [name], MD  pregabalin (LYRICA) 150 MG capsule Take 1 capsule by mouth two times daily  01/03/21     pregabalin (LYRICA) 150 MG capsule Take 1 capsule by mouth 2 times daily. 01/30/21     pregabalin (LYRICA) 150 MG capsule Take 1 capsule by mouth 2 times daily 03/01/21     pregabalin (LYRICA) 150 MG capsule Take 1 (one) Capsule by mouth two times daily 04/29/21     pregabalin (LYRICA) 150 MG capsule Take 1 capsule by mouth 2 times daily 05/30/21     pregabalin (LYRICA) 150 MG capsule Take 1 capsule by mouth 2 times a day 06/27/21     pregabalin (LYRICA) 150 MG capsule Take 1 capsule by mouth two times daily 07/25/21     pregabalin (LYRICA) 150 MG capsule Take 1 capsule by mouth two times daily 08/22/21     senna-docusate (SENOKOT S) 8.6-50 MG tablet Take 1 tablet by mouth 2 (two) times daily. 05/31/17   Eugenie Filler, MD  sildenafil (VIAGRA) 100 MG tablet Take 100 mg by mouth as needed for erectile dysfunction.    [provider]      Allergies    Brilinta [ticagrelor], Methylprednisolone, Prednisone, Toradol [ketorolac tromethamine], and Adhesive [tape]    Review of Systems   Review of Systems  Cardiovascular:  Positive for chest pain.    Physical Exam Updated Vital Signs BP 118/79   Pulse 73   Temp 97.7 F (36.5 C) (Oral)   Resp 10   SpO2 97%  Physical Exam Vitals and nursing note reviewed. Exam conducted with a chaperone present.  Constitutional:      Appearance: Normal appearance.  HENT:     Head: Normocephalic and atraumatic.  Eyes:     General: No scleral icterus.       Right eye: No discharge.        Left eye: No discharge.     Extraocular Movements: Extraocular  movements intact.     Pupils: Pupils are equal, round, and reactive to light.  Neck:     Comments: No JVD Cardiovascular:     Rate and Rhythm: Normal rate and regular rhythm.     Pulses: Normal pulses.     Heart sounds: Normal heart sounds. No murmur heard.    No friction rub. No gallop.     Comments: Radial pulse 2+ bilaterally Pulmonary:     Effort: Pulmonary effort is normal. No respiratory distress.     Breath sounds: Normal breath sounds.  Chest:     Chest wall: No tenderness.  Abdominal:     General: Abdomen is flat. Bowel sounds are normal. There is no distension.     Palpations: Abdomen is soft.     Tenderness: There is no abdominal tenderness.  Musculoskeletal:        General: Tenderness present.     Right lower leg: No edema.     Left lower leg: No edema.     Comments: Midthoracic pinpoint tenderness  Skin:    General: Skin is warm and dry.     Capillary Refill: Capillary refill takes less than 2 seconds.     Coloration: Skin is not jaundiced.  Neurological:     Mental Status: He is alert. Mental status is at baseline.     Coordination: Coordination normal.     ED Results / Procedures / Treatments   Labs (all labs ordered are listed, but only abnormal results are displayed) Labs Reviewed  BASIC METABOLIC PANEL - Abnormal; Notable for the following components:      Result Value   Glucose, Bld  110 (*)    All other components within normal limits  CBC - Abnormal; Notable for the following components:   Hemoglobin 12.2 (*)    HCT 38.4 (*)    MCH 25.5 (*)    RDW 16.9 (*)    All other components within normal limits  TROPONIN I (HIGH SENSITIVITY) - Abnormal; Notable for the following components:   Troponin I (High Sensitivity) 175 (*)    All other components within normal limits  TROPONIN I (HIGH SENSITIVITY) - Abnormal; Notable for the following components:   Troponin I (High Sensitivity) 175 (*)    All other components within normal limits  BRAIN  NATRIURETIC PEPTIDE  HEPARIN LEVEL (UNFRACTIONATED)    EKG EKG Interpretation  Date/Time:  Sunday September 15 2021 08:20:42 EDT Ventricular Rate:  87 PR Interval:  176 QRS Duration: 106 QT Interval:  373 QTC Calculation: 449 R Axis:   16 Text Interpretation: Sinus rhythm Probable left atrial enlargement Abnormal inferior Q waves Nonspecific T abnormalities, anterior leads slight decrease voltage throughout, otherwise similar to previous Confirmed by Charlesetta Shanks [phone]) on 09/15/2021 9:05:54 AM  Radiology CT Angio Chest/Abd/Pel for Dissection W and/or W/WO  Result Date: 09/15/2021 CLINICAL DATA:  Chest pain, back pain, acute aortic syndrome suspected EXAM: CT ANGIOGRAPHY CHEST, ABDOMEN AND PELVIS TECHNIQUE: Non-contrast CT of the chest was initially obtained. Multidetector CT imaging through the chest, abdomen and pelvis was performed using the standard protocol during bolus administration of intravenous contrast. Multiplanar reconstructed images and MIPs were obtained and reviewed to evaluate the vascular anatomy. RADIATION DOSE REDUCTION: This exam was performed according to the departmental dose-optimization program which includes automated exposure control, adjustment of the mA and/or kV according to patient size and/or use of iterative reconstruction technique. CONTRAST:  152m OMNIPAQUE IOHEXOL 350 MG/ML SOLN COMPARISON:  Noncontrast CT done on 04/22/2021 FINDINGS: CTA CHEST FINDINGS Cardiovascular: There is no demonstrable intramural hematoma in the thoracic aorta in the noncontrast images of the chest. There is homogeneous enhancement in thoracic aorta. There is no demonstrable intimal flap. There are scattered calcifications. Coronary artery calcifications are seen. There is no evidence of central pulmonary artery embolism. Mediastinum/Nodes: No significant lymphadenopathy is seen. Lungs/Pleura: Centrilobular emphysema is seen. Scattered blebs and bullae are seen. There is no focal  pulmonary consolidation. There is no pleural effusion or pneumothorax. Musculoskeletal: There is surgical fusion in lower thoracic spine and lumbar spine. Pain control lead is seen in the thoracic spinal canal. Review of the MIP images confirms the above findings. CTA ABDOMEN AND PELVIS FINDINGS VASCULAR Aorta: Atherosclerotic plaques and calcifications are seen in the aorta without dissection or aneurysm Celiac: Unremarkable. SMA: Unremarkable. Renals: No significant stenosis. IMA: Patent. Inflow: Atherosclerotic plaques and calcifications are seen in the iliac arteries. There is ectasia of left common iliac artery. 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 Veins: Unremarkable. Review of the MIP images confirms the above findings. NON-VASCULAR Hepatobiliary: There is fatty infiltration. There is no dilation of bile ducts. Gallbladder is unremarkable. Pancreas: No focal abnormalities are seen. Spleen: Unremarkable. Adrenals/Urinary Tract: Mild hyperplasia of left adrenal has not changed. There is no hydronephrosis. There are no renal or ureteral stones. There is a 11 mm low-density in the lower pole of right kidney suggesting renal cysts. Urinary bladder is unremarkable. Stomach/Bowel: Stomach is unremarkable. Small bowel loops are nondilated. Appendix is not dilated. There is no significant wall thickening and colon. There is no pericolic stranding. Lymphatic: Unremarkable Reproductive: Coarse  calcification is seen in the prostate. Other: There is no ascites or pneumoperitoneum. Right inguinal hernia containing fat is seen. Musculoskeletal: Degenerative changes are noted in lower thoracic spine and lumbar spine. There is surgical fusion in lower thoracic spine and lumbar spine. Laminectomy is seen in the lower lumbar region. There is previous right hip arthroplasty. Review of the MIP images confirms the above findings. IMPRESSION: There is no evidence of dissection in the  thoracic and abdominal aorta. Major branches of thoracic and abdominal aorta are patent. Scattered atherosclerotic plaques and calcifications are seen in the aorta and its major branches. 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 Coronary artery calcifications are seen. There are no signs of pulmonary artery embolism. COPD.  No focal pulmonary infiltrates are seen. There is no evidence of intestinal obstruction or pneumoperitoneum. There is no hydronephrosis. Appendix is not dilated. Fatty liver. Other findings as described in the body of the report. Electronically Signed   By: Elmer Picker M.D.   On: 09/15/2021 11:42   DG Chest Port 1 View  Result Date: 09/15/2021 CLINICAL DATA:  Chest pain EXAM: PORTABLE CHEST 1 VIEW COMPARISON:  04/19/2018 FINDINGS: Normal heart size and mediastinal contours. Low volume chest. There is no edema, consolidation, effusion, or pneumothorax. Spinal fusion and spinal cord stimulator leads. IMPRESSION: No evidence of active disease. Electronically Signed   By: Jorje Guild M.D.   On: 09/15/2021 09:27    Procedures .Critical Care  Performed by: Sherrill Raring, PA-C Authorized by: Sherrill Raring, PA-C   Critical care provider statement:    Critical care time (minutes):  30   Critical care start time:  09/15/2021 12:00 PM   Critical care end time:  09/15/2021 12:30 PM   Critical care was necessary to treat or prevent imminent or life-threatening deterioration of the following conditions:  Cardiac failure (heparin, ACS)   Critical care was time spent personally by me on the following activities:  Development of treatment plan with patient or surrogate, discussions with consultants, evaluation of patient's response to treatment, examination of patient, ordering and review of laboratory studies, ordering and review of radiographic studies, ordering and performing treatments and interventions, pulse oximetry, re-evaluation of patient's  condition and review of old charts   Care discussed with: admitting provider       Medications Ordered in ED Medications  heparin ADULT infusion 100 units/mL (25000 units/268m) (1,200 Units/hr Intravenous New Bag/Given 09/15/21 1232)  HYDROmorphone (DILAUDID) injection 1 mg (1 mg Intravenous Given 09/15/21 0931)  iohexol (OMNIPAQUE) 350 MG/ML injection 100 mL (100 mLs Intravenous Contrast Given 09/15/21 1116)  heparin bolus via infusion 4,000 Units (4,000 Units Intravenous Bolus from Bag 09/15/21 1232)  morphine (PF) 4 MG/ML injection 4 mg (4 mg Intravenous Given 09/15/21 1228)    ED Course/ Medical Decision Making/ A&P Clinical Course as of 09/15/21 1244  Sun Sep 15, 2021  0938 CBC(!) No leukocytosis or anemia. [HS]  08101DG Chest Port 1 View Negative for any acute process.  Specifically no widened mediastinum, cardiomegaly, pneumonia, pneumothorax. [HS]  17510Basic metabolic panel(!) No gross electrolyte derangement or AKI, patient's mildly hyperglycemic at 110. [HS]  1026 Troponin I (High Sensitivity)(!!) Troponin elevated at 175, will consult with cardiology.  Patient given 325 of aspirin prior to arrival by EMS. [HS]  12585Brain natriuretic peptide BNP is unremarkable. [HS]  158CT Angio Chest/Abd/Pel for Dissection W and/or W/WO CTA chest does not show any evidence of dissection.  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 [HS]  1155 Troponin I (High Sensitivity)(!!) Delta troponin 0. [HS]    Clinical Course User Index [HS] Sherrill Raring, PA-C                           Medical Decision Making Amount and/or Complexity of Data Reviewed Labs: ordered. Decision-making details documented in ED Course. Radiology: ordered. Decision-making details documented in ED Course.  Risk Prescription drug management. Decision regarding hospitalization.   Patient presents with chest pain  back pain.  Differential includes not limited to ACS, aortic dissection, PE, pneumonia, pneumothorax, msk  Patient is not febrile, not tachycardic, hypoxic or tachypneic.  He is uncomfortable appearing, I do not appreciate any murmurs or rubs. Lungs CTA. Reproducible thoracic pain. We will proceed with cardiac work-up, dissection is high on my differential.   I reviewed external records including patient's cardiology history involving last cath in 2017.  Patient's wife is at bedside providing history.  EMS also provided history including giving patient aspirin prior to arrival.  I ordered and viewed and interpreted laboratory work-up.  I also ordered and reviewed the imaging.  Please see ED course for interpretation as they pertain to the differential.  I consulted with Dr. Acie Fredrickson with cardiology.  Troponins are elevated at 175 but are flat.  He advises heparin and admission to medical service.  Cardiology will consult as needed.  Initially I did consider sending the patient home with close outpatient follow-up.  Wife is very anxious, patient also has not had an echocardiogram since 2020.  His last cardiac catheterization was in 2018.  He does have significant risk factors and is still having persistent pain so I feel patient would benefit from admission for observation and ACS work-up.  I will consult hospitalist service for admission.  Dr. Doristine Bosworth accepts the patient.   Discussed HPI, physical exam and plan of care for this patient with attending Charlesetta Shanks. The attending physician evaluated this patient as part of a shared visit and agrees with plan of care.    Final Clinical Impression(s) / ED Diagnoses Final diagnoses:  Elevated troponin  Chest pain, unspecified type    Rx / DC Orders ED Discharge Orders     None         Sherrill Raring, PA-C 09/15/21 1241    Sherrill Raring, PA-C 09/15/21 1244    [address], PA-C 09/15/21 1354    Charlesetta Shanks, MD 09/18/21 1007

## 2021-09-15 NOTE — Plan of Care (Signed)
  Problem: Education: Goal: Knowledge of General Education information will improve Description: Including pain rating scale, medication(s)/side effects and non-pharmacologic comfort measures Outcome: Progressing   Problem: Clinical Measurements: Goal: Ability to maintain clinical measurements within normal limits will improve Outcome: Progressing Goal: Will remain free from infection Outcome: Progressing Goal: Respiratory complications will improve Outcome: Progressing Goal: Cardiovascular complication will be avoided Outcome: Progressing   Problem: Coping: Goal: Level of anxiety will decrease Outcome: Progressing   Problem: Elimination: Goal: Will not experience complications related to bowel motility Outcome: Progressing Goal: Will not experience complications related to urinary retention Outcome: Progressing

## 2021-09-15 NOTE — ED Notes (Signed)
RN ordered pt house tray

## 2021-09-15 NOTE — ED Triage Notes (Signed)
Pt BIB EMS due to chest and back pain. Pt has hx of MI. Pt states he was chaning tire on Friday and thinks he pulled a muscle doing that. Pt states its hard to breathe in. VSS. Axox4. Pt abusive towards staff on arrival and states he is leaving. Pt continues top lay in stretcher.

## 2021-09-15 NOTE — ED Provider Notes (Signed)
I provided a substantive portion of the care of this patient.  I personally performed the entirety of the history for this encounter.  EKG Interpretation  Date/Time:  Sunday September 15 2021 08:20:42 EDT Ventricular Rate:  87 PR Interval:  176 QRS Duration: 106 QT Interval:  373 QTC Calculation: 449 R Axis:   16 Text Interpretation: Sinus rhythm Probable left atrial enlargement Abnormal inferior Q waves Nonspecific T abnormalities, anterior leads slight decrease voltage throughout, otherwise similar to previous Confirmed by Charlesetta Shanks [phone]) on 09/15/2021 9:05:54 AM   Patient has history of chronic back pain.  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  When he had significantly increased pain in the left thoracic back he thought it was due to muscular strain from working on the car.  However, he started to get a lot of pain into the left chest as well.  Since yesterday he has had associated chest pain worse with deep breaths and movements.  Patient reports he feels short of breath and is hard to take a deep breath.  He reports that he has prior history of MI and the pain in his chest now feels like it did when he had his heart attack.  Patient is alert.  He is very uncomfortable in appearance.  He has increased respiratory rate with more shallow breathing.  He is maintaining his airway without difficulty and oxygen saturation is 100% on the monitor in room air.  Mental status is clear heart regular.  I cannot appreciate asymmetric breath sounds.  Grossly clear but patient is taking shallow inspirations.  No significant peripheral edema.  Calves are soft and nontender.  Portable chest x-ray reviewed by myself at bedside.  No apparent pneumothorax.  Both lungs appear inflated evenly.  No mediastinal widening.  Normal aortic knob.  No significant cardiomegaly.  No evident effusions or vascular congestion.  No obvious rib fractures.  Assessed the  patient.  He does have severe pain.  Pain is both thoracic back and left chest with severe pain on inspiration..  Chest x-ray does not show evident abnormality of pneumothorax or significant cardiomegaly.  At this time with pneumothorax and significant pericardial effusion or tamponade low probability based on preliminary imaging, agree with plan of management and continuing work-up.  I have ordered 1 mg Dilaudid IV for pain control   Charlesetta Shanks, MD 09/15/21 [phone]

## 2021-09-15 NOTE — Progress Notes (Signed)
[name] had called answering service earlier today to notify that pt was in ED. On-call APP Almyra Deforest received page and called her back, got her VM, notified her that he had received msg and cardiology team would await ER recs. We have since consulted on his care. She paged the answering service back a second time to notify [name] he was in the ER. Isaac Laud tried to call her back but again went to VM. Therefore we sent msg to ED PA to please relay to the wife that we have received her msg and will make sure [name] gets a routed copy of the encounter today. EDPA indicates plan for overnight obs to IM team.

## 2021-09-15 NOTE — H&P (Signed)
Date: 09/15/2021               Patient Name:  Thomas Mcclure MRN: [id number]  DOB: 11/05/1955 Age / Sex: 66 y.o., male   PCP: Fredrich Romans, [place]         Medical Service: Internal Medicine Teaching Service         Attending Physician: Dr. Evette Doffing, Mallie Mussel, *    First Contact: Gaylyn Rong, MD      Pager: Dorothea Ogle [phone]      Second Contact: Linwood Dibbles, MD      Pager: PA [phone]           After Hours (After 5p/  First Contact Pager: [phone]  weekends / holidays): Second Contact Pager: [phone]   SUBJECTIVE   Chief Complaint: Sudden onset back pain  History of Present Illness: 66 year old male with past medical history of CAD with NSTEMI status post stent to RCA, systolic and diastolic heart failure, ischemic cardiomyopathy, T2DM, HTN, HLD, chronic back pain status post multiple level spinal fusions and spinal cord stimulator presenting with sudden onset chest and back pain.  Wife is in the room to provide additional history.  He was working on his truck last Friday and laying his back.  When he got up he felt a sudden sharp, pulling pain in his back between his shoulder blades as he was changing one of the tires.  This pain is gotten worse over the past 48 hours and he said that at times it is a 10 out of 10.  It seems to be provoked by positional changes and also while taking deep breaths.  He says that it is tender to palpation.  He denies any shortness of breath, nausea, vomiting at this time.  Of note, he had a fall about 2 weeks ago, but he mentions that the only pain he felt following the fall was in his right wrist, which resolved with conservative measures. He mentions that this feels different than his previous lower back pain and also does not feel exactly like his prior NSTEMI, but that it was somewhat similar and he wanted to make sure he was not having another heart attack.   Patient has had multiple spinal fusion surgeries and also has spinal stimulators  implanted.  He is on a significant pain regimen at home for back pain and he mentions that he has tried Tylenol and also his Flexeril (which he takes for leg cramps), but that these have not worked.  ED Course: Patient initially hemodynamically stable, afebrile and satting well on room air in the ED. Tenderness to palpation of his thoracic back on initial exam.  Initial EKG similar to prior with sinus rhythm and nonspecific T wave abnormalities, but no clear ST elevations.  Initial tropes flat at 175 and 175 (of note this patient has an extensive history of mildly elevated troponins).  BMP unremarkable.  Chest x-ray showing no acute cardiopulmonary process or clear fractures.  CTA dissection protocol showing no thoracic or abdominal aortic dissection/aneurysm.  Consensus is the likely etiology is musculoskeletal.  Patient received IV Dilaudid for pain management x1 and also an injection of morphine.  Patient was also started on heparin bolus and is on heparin drip.  Meds:  Current Meds  Medication Sig   acetaminophen (TYLENOL) 500 MG tablet Take 1,000 mg by mouth 2 (two) times daily as needed (pain.).   atorvastatin (LIPITOR) 80 MG tablet Take 80 mg by mouth daily at 6 PM.  Buprenorphine HCl (BELBUCA) 900 MCG FILM Place 1 Film inside cheeks two times daily   carvedilol (COREG) 12.5 MG tablet TAKE 1 TABLET BY MOUTH TWICE DAILY WITH A MEAL (Patient taking differently: Take 12.5 mg by mouth 2 (two) times daily with a meal.)   chlorhexidine (PERIDEX) 0.12 % solution 15 mLs by Mouth Rinse route 2 (two) times daily.   chlorthalidone (HYGROTON) 25 MG tablet TAKE 1 TABLET BY MOUTH DAILY (Patient taking differently: Take 25 mg by mouth daily.)   cyclobenzaprine (FLEXERIL) 10 MG tablet Take 1 tablet (10 mg total) by mouth 3 (three) times daily as needed for muscle spasms.   dapagliflozin propanediol (FARXIGA) 10 MG TABS tablet Take 1 tablet by mouth daily   diclofenac Sodium (VOLTAREN) 1 % GEL Apply 1  Application topically 2 (two) times daily as needed for pain.   diltiazem (DILT-XR) 180 MG 24 hr capsule Take 1 capsule by mouth daily (Patient taking differently: Take 180 mg by mouth daily.)   docusate sodium (COLACE) 100 MG capsule Take 1 capsule (100 mg total) by mouth 2 (two) times daily. (Patient taking differently: Take 300 mg by mouth 2 (two) times daily.)   DULoxetine (CYMBALTA) 60 MG capsule Take 1 capsule by mouth daily (Patient taking differently: Take 60 mg by mouth daily.)   glipiZIDE (GLUCOTROL XL) 10 MG 24 hr tablet Take 10 mg by mouth daily with breakfast.   lisinopril (ZESTRIL) 20 MG tablet Take 1 tablet (20 mg total) by mouth daily. (Patient taking differently: Take 20 mg by mouth at bedtime.)   lubiprostone (AMITIZA) 24 MCG capsule Take 2 capsules (48 mcg total) by mouth at bedtime.   metFORMIN (GLUCOPHAGE) 1000 MG tablet Take 1,000 mg by mouth 2 (two) times daily with a meal.   Multiple Vitamin (MULTIVITAMIN WITH MINERALS) TABS tablet Take 1 tablet by mouth daily.   omeprazole (PRILOSEC) 40 MG capsule Take 1 capsule by mouth daily   Oxycodone HCl 20 MG TABS Take 1 tablet (20 mg total) by mouth every 6 (six) hours as needed. (Patient taking differently: Take 20 mg by mouth every 6 (six) hours as needed (pain).)   Potassium Chloride ER 20 MEQ TBCR Take 1 tablet by mouth daily (Patient taking differently: Take 20 mEq by mouth daily.)   pregabalin (LYRICA) 150 MG capsule Take 1 capsule by mouth two times daily (Patient taking differently: Take 150 mg by mouth 2 (two) times daily.)   sildenafil (VIAGRA) 100 MG tablet Take 100 mg by mouth as needed for erectile dysfunction.    Past Medical History  Past Surgical History:  Procedure Laterality Date   BACK SURGERY     x3   bilateral cataract curgery      CARDIAC CATHETERIZATION N/A 07/15/2015   Procedure: Left Heart Cath and Coronary Angiography;  Surgeon: [name], MD;  Location: [place] [facility];  Service:  Cardiovascular;  Laterality: N/A;   CARDIAC CATHETERIZATION N/A 07/15/2015   Procedure: Coronary Stent Intervention;  Surgeon: [name], MD;  Location: [place] [facility];  Service: Cardiovascular;  Laterality: N/A;   CARDIAC CATHETERIZATION N/A 08/07/2015   Procedure: Left Heart Cath and Coronary Angiography;  Surgeon: [name], MD;  Location: [place] [facility];  Service: Cardiovascular;  Laterality: N/A;   CORONARY ANGIOPLASTY WITH STENT PLACEMENT  07/2015   LEFT HEART CATH AND CORONARY ANGIOGRAPHY N/A 02/19/2017   Procedure: LEFT HEART CATH AND CORONARY ANGIOGRAPHY;  Surgeon: Martinique, Peter M, MD;  Location: [place]  [facility];  Service: Cardiovascular;  Laterality: N/A;   LUMBAR FUSION  04/11/2019   REVISION OF THORACOLUMBAR FUSION    LUMBAR SPINE SURGERY  03/2009  & 2012   MASS EXCISION N/A 04/19/2012   Procedure: removal of posterior cervical lipoma;  Surgeon: [name], MD;  Location: [place] [facility];  Service: Neurosurgery;  Laterality: N/A;  Removal of posterior cervical lipoma   POPLITEAL SYNOVIAL CYST EXCISION Left    "opened up behind my knee; scraped out arthritis"   POSTERIOR LUMBAR FUSION 4 LEVEL N/A 11/22/2018   Procedure: Posterior Lateral and Interbody fusion - Lumbar one-Lumbar two; explore fusion, posterior instrumentation and fusion Thoracic ten to the ilium;  Surgeon: Newman Pies, MD;  Location: [place];  Service: Neurosurgery;  Laterality: N/A;   SPINAL CORD STIMULATOR INSERTION N/A 01/07/2013   Procedure:  SPINAL CORD STIMULATOR INSERTION;  Surgeon: [name], MD;  Location: [place] [facility];  Service: Neurosurgery;  Laterality: N/A;   SPINAL CORD STIMULATOR INSERTION N/A 02/09/2019   Procedure: REPLACEMENT OF LUMBAR SPINAL CORD STIMULATOR;  Surgeon: Newman Pies, MD;  Location: [place];  Service: Neurosurgery;  Laterality: N/A;  Thoracic/Lumbar spine   TONSILLECTOMY     patient denies   TOTAL HIP ARTHROPLASTY Right 10/03/2016   Procedure: TOTAL  HIP ARTHROPLASTY;  Surgeon: [name], MD;  Location: [place];  Service: Orthopedics;  Laterality: Right;   TOTAL KNEE ARTHROPLASTY Right 11/09/2020   Procedure: TOTAL KNEE ARTHROPLASTY;  Surgeon: [name], MD;  Location: [facility];  Service: Orthopedics;  Laterality: Right;   WISDOM TOOTH EXTRACTION     hx of    Social:  Lives With: His wife at home Occupation: Unemployed, does a lot of manual labor Support: Wife present with him during assessment, very active in his church Level of Function: Independently manages his ADLs and IADLs, able to perform significant amounts of manual labor without issues PCP: Fredrich Romans, [place] Substances: Denies tobacco, alcohol, other substance use  Family History: Not obtained  Allergies: Allergies as of 09/15/2021 - Review Complete 09/15/2021  Allergen Reaction Noted   Brilinta [ticagrelor] Shortness Of Breath and Other (See Comments) 01/04/2016   Methylprednisolone Other (See Comments) 12/03/2015   Prednisone Other (See Comments) 12/03/2015   Toradol [ketorolac tromethamine] Other (See Comments) 01/04/2013   Adhesive [tape] Rash and Other (See Comments) 10/07/2010    Review of Systems: A complete ROS was negative except as per HPI.   OBJECTIVE:   Physical Exam: Blood pressure 116/75, pulse 80, temperature 97.7 F (36.5 C), temperature source Oral, resp. rate 10, SpO2 98 %.  Constitutional: Comfortable appearing male laying in bed, appears to be in pain and uncomfortable with movements and deep breathing cardiovascular: RRR, no murmurs, rubs or gallops Respiratory: Normal work of breathing on room air, lungs clear to auscultation bilaterally Abdominal: soft, non-tender, non-distended Extremities: No lower extremity edema noted, extremity pulses 2+ MSK/Skin: Sharp tenderness to palpation of right subscapular paraspinal muscle and soft tissue.  No clear point vertebral tenderness.  Small area of erythema overlying the right flank    Labs:    Latest Ref Rng & Units 09/15/2021    9:00 AM 11/01/2020    9:38 AM 04/12/2019    3:07 AM 04/07/2019   10:33 AM 02/09/2019    9:09 AM 11/24/2018    4:09 AM 11/23/2018    5:53 AM  CBC  Hb 13.0 - 17.0 g/dL 12.2  12.6  9.9  11.2  11.4  9.9  9.9  Hct 39.0 - 52.0 % 38.4  40.4  31.8  37.1  37.2  29.7  30.8   MCV 80.0 - 100.0 fL 80.2  78.8  77.4  79.3  80.5  82.0  82.8   Plts 150 - 400 K/uL 222  262  288  373  364  232  259       Latest Ref Rng & Units 09/15/2021    9:00 AM 11/01/2020    9:38 AM 04/12/2019    3:07 AM 04/07/2019   10:33 AM 02/09/2019    9:09 AM 11/23/2018    5:53 AM 11/18/2018   10:02 AM  CMP  Glucose 70 - 99 mg/dL 110  143  148  122  125  202  151   BUN 8 - 23 mg/dL '16  24  7  14  14  17  12   '$ Creatinine 0.61 - 1.24 mg/dL 0.94  1.10  1.02  1.20  1.09  1.17  0.97   Sodium 135 - 145 mmol/L 139  137  140  136  137  138  136   Potassium 3.5 - 5.1 mmol/L 4.0  3.8  4.0  4.1  4.3  3.8  3.9   Chloride 98 - 111 mmol/L 105  100  101  98  98  100  99   CO2 22 - 32 mmol/L '26  25  29  25  27  26  25   '$ Calcium 8.9 - 10.3 mg/dL 9.2  9.4  8.9  9.1  9.4  8.6  9.3   Total Protein 6.5 - 8.1 g/dL  7.5        Total Bilirubin 0.3 - 1.2 mg/dL  0.5        Alkaline Phos 38 - 126 U/L  89        AST 15 - 41 U/L  23        ALT 0 - 44 U/L  23        BNP 10.7 Troponins 175 and 175 Imaging: Chest x-ray (09/15/2021): No clear consolidation, aortic knob, rib or vertebral fractures.  No pneumothorax  CTA dissection protocol (09/15/2021): Negative for thoracic or abdominal aorta dissection/aneurysm.  No PE. Multiple spinal level fusions visualized, but no acute fractures.  EKG: personally reviewed my interpretation is normal sinus rhythm, l pronounced QRSs in V5, inferior Q waves.no clear ST elevations   ASSESSMENT & PLAN:   Assessment & Plan by Problem: Principal Problem:   Atypical chest pain   KEMONI ORTEGA is a 66 y.o. male with PMH CAD with NSTEMI status post stent to RCA, systolic  and diastolic heart failure, ischemic cardiomyopathy, T2DM, HTN, HLD, chronic back pain status post multiple level spinal fusions and spinal cord stimulator who presented with chest pain and back pain and admitted for atypical chest pain/rule out ACS.  #Atypical chest pain #CAD with NSTEMI status post RCA stent #HTN/HLD Patient presents with likely musculoskeletal/pleuritic subscapular back pain following significant exertion.  Initial trops flat with a chronic history of minimally elevated troponins, EKG does not show STEMI.  Imaging rules out pneumonia, dissection, AAA, PE.  Blood pressures well managed in the ED.  Cardiology consulted, with low suspicion of ACS at this point.  Status post heparin bolus. - Cardiology following, appreciate recs - Continue heparin drip overnight.  CBC tomorrow. - Continue home Coreg 12.5 twice daily, diltiazem 180 daily, holding home lisinopril and chlorthalidone for now - Continue home Lipitor 80.  Lipid  panel today - Discuss starting aspirin 81 with patient  #Subscapular back pain #Chronic back pain status post spinal fusions and spinal cord stimulator Patient's presentation is more consistent with acute back pain and atypical chest pain.  Etiologies ruled out as above, and also additionally no acute fractures noted on imaging.  Patient has a history of multiple spinal fusions with spinal stimulator intact, also takes oxycodone 20 mg every 6 at home and has a contract with his pain clinic.  Takes Flexeril 10 every night at home for leg cramps.  Received Dilaudid and morphine in the ED.  Low concern for vertebral fracture or disc herniation at this point and no alarm symptoms, no further imaging indicated. - Continue home oxycodone 20 mg every 6 as needed and buprenorphine sublingual twice daily.  Patient cannot be discharged on any other opioid medications due to his pain clinic contract. - Start Robaxin 750 every 6 as needed  #T2DM with neuropathy A1c back in  2022 7.1.  Patient takes Cymbalta and Lyrica at home.  As well as glipizide and [place]. - Continue home glipizide 10, Farxiga 10, Cymbalta 60 daily and Lyrica 150 twice daily - A1c  #Combined systolic and diastolic heart failure Echo in 2020 showing EF 45 to 50% with increased LV wall thickness and impaired relaxation.  Euvolemic on exam today. -Continue Coreg, Farxiga.  Holding lisinopril and chlorthalidone.   Diet: Heart Healthy VTE: Heparin IVF: None Code: Full  Prior to Admission Living Arrangement: Home, living with wife Anticipated Discharge Location: Home Barriers to Discharge: Resolution of atypical chest/back pain and rule out ACS.  Dispo: Admit patient to Observation with expected length of stay less than 2 midnights.  Signed: Linus Galas, MD Internal Medicine Resident PGY-1  09/15/2021, 4:31 PM

## 2021-09-16 ENCOUNTER — Other Ambulatory Visit (HOSPITAL_COMMUNITY): Payer: Self-pay

## 2021-09-16 DIAGNOSIS — R0789 Other chest pain: Secondary | ICD-10-CM | POA: Diagnosis not present

## 2021-09-16 LAB — LIPID PANEL
Cholesterol: 105 mg/dL (ref 0–200)
HDL: 31 mg/dL — ABNORMAL LOW (ref >40)
LDL Cholesterol: 56 mg/dL (ref 0–99)
Total CHOL/HDL Ratio: 3.4 RATIO
Triglycerides: 89 mg/dL (ref <150)
VLDL: 18 mg/dL (ref 0–40)

## 2021-09-16 LAB — BASIC METABOLIC PANEL
Anion gap: 9 (ref 5–15)
BUN: 17 mg/dL (ref 8–23)
CO2: 30 mmol/L (ref 22–32)
Calcium: 9.6 mg/dL (ref 8.9–10.3)
Chloride: 101 mmol/L (ref 98–111)
Creatinine, Ser: 1.06 mg/dL (ref 0.61–1.24)
GFR, Estimated: >60 mL/min (ref >60)
Glucose, Bld: 162 mg/dL — ABNORMAL HIGH (ref 70–99)
Potassium: 3.6 mmol/L (ref 3.5–5.1)
Sodium: 140 mmol/L (ref 135–145)

## 2021-09-16 LAB — HEMOGLOBIN A1C
Hgb A1c MFr Bld: 6.6 % — ABNORMAL HIGH (ref 4.8–5.6)
Mean Plasma Glucose: 142.72 mg/dL

## 2021-09-16 MED ORDER — ORAL CARE MOUTH RINSE: 15.0000 mL | OROMUCOSAL | Status: DC | PRN

## 2021-09-16 MED ORDER — ACETAMINOPHEN 325 MG PO TABS
650.0000 mg | ORAL_TABLET | Freq: Four times a day (QID) | ORAL | Status: DC | PRN
  Administered 2021-09-16 (×2): 650 mg via ORAL
  Filled 2021-09-16 (×2): qty 2

## 2021-09-16 MED ORDER — METHOCARBAMOL 750 MG PO TABS
750.0000 mg | ORAL_TABLET | Freq: Four times a day (QID) | ORAL | 2 refills | Status: DC | PRN
Start: 2021-09-16 — End: 2022-03-24
  Filled 2021-09-16: qty 40, 10d supply, fill #0

## 2021-09-16 MED ORDER — ASPIRIN 81 MG PO CHEW
81.0000 mg | CHEWABLE_TABLET | Freq: Every day | ORAL | Status: DC
  Administered 2021-09-16: 81 mg via ORAL
  Filled 2021-09-16: qty 1

## 2021-09-16 MED ORDER — ASPIRIN 81 MG PO CHEW
81.0000 mg | CHEWABLE_TABLET | Freq: Every day | ORAL | 2 refills | Status: DC
  Filled 2021-09-16: qty 30, 30d supply, fill #0

## 2021-09-16 NOTE — TOC Progression Note (Signed)
Transition of Care [facility]) - Progression Note    Patient Details  Name: Thomas Mcclure MRN: [id number] Date of Birth: 11-29-1955  Transition of Care [facility]) CM/SW [place], RN Phone [phone]  09/16/2021, 10:23 AM  Clinical Narrative:     Transition of Care [facility]) Screening Note   Patient Details  Name: Thomas Mcclure Date of Birth: 1955/04/22   Transition of Care [facility]) CM/SW Contact:    Angelita Ingles, RN Phone Number: 09/16/2021, 10:24 AM    Transition of Care Department (TOC) has reviewed patient and no TOC needs have been identified at this time. We will continue to monitor patient advancement through interdisciplinary progression rounds. If new patient transition needs arise, please place a TOC consult.          Expected Discharge Plan and Services                                                 Social Determinants of Health (SDOH) Interventions    Readmission Risk Interventions     No data to display

## 2021-09-16 NOTE — Discharge Summary (Cosign Needed Addendum)
Name: Thomas Mcclure MRN: [id number] DOB: 08/08/1955 66 y.o. PCP: Thomas Romans, PA  Date of Admission: 09/15/2021  7:59 AM Date of Discharge: 09/16/2021 Attending Physician: Dr. Evette Mcclure  Discharge Diagnosis: Principal Problem:   Atypical chest pain Active Problems:   Hypertension   Hyperlipidemia with target LDL less than 100   CAD (coronary artery disease)   Chronic low back pain    Discharge Medications: Allergies as of 09/16/2021       Reactions   Brilinta [ticagrelor] Shortness Of Breath, Other (See Comments)   CHEST PAIN    Methylprednisolone Other (See Comments)   DIAPHORESIS, HYPOTENSION   Prednisone Other (See Comments)   DIAPHORESIS, HYPOTENSION   Toradol [ketorolac Tromethamine] Other (See Comments)   DIAPHORESIS, HYPOTENSION   Adhesive [tape] Rash, Other (See Comments)   "blisters" ( NO SURGICAL TAPE, PLEASE)        Medication List     STOP taking these medications    cyclobenzaprine 10 MG tablet Commonly known as: FLEXERIL       TAKE these medications    Accu-Chek Aviva Plus test strip Generic drug: glucose blood   Accu-Chek Softclix Lancets lancets 4 (four) times daily.   acetaminophen 500 MG tablet Commonly known as: TYLENOL Take 1,000 mg by mouth 2 (two) times daily as needed (pain.).   Aspirin Low Dose 81 MG chewable tablet Generic drug: aspirin Chew 1 tablet (81 mg total) by mouth daily.   atorvastatin 80 MG tablet Commonly known as: LIPITOR Take 80 mg by mouth daily at 6 PM.   Belbuca 900 MCG Film Generic drug: Buprenorphine HCl Place 1 Film inside cheeks two times daily   carvedilol 12.5 MG tablet Commonly known as: COREG TAKE 1 TABLET BY MOUTH TWICE DAILY WITH A MEAL   chlorhexidine 0.12 % solution Commonly known as: PERIDEX 15 mLs by Mouth Rinse route 2 (two) times daily.   chlorthalidone 25 MG tablet Commonly known as: HYGROTON TAKE 1 TABLET BY MOUTH DAILY What changed: how much to take   diclofenac Sodium 1 %  Gel Commonly known as: VOLTAREN Apply 1 Application topically 2 (two) times daily as needed for pain.   diltiazem 180 MG 24 hr capsule Commonly known as: Dilt-XR Take 1 capsule by mouth daily What changed:  how much to take when to take this   docusate sodium 100 MG capsule Commonly known as: COLACE Take 1 capsule (100 mg total) by mouth 2 (two) times daily. What changed: how much to take   DULoxetine 60 MG capsule Commonly known as: CYMBALTA Take 1 capsule by mouth daily What changed:  how much to take when to take this   Farxiga 10 MG Tabs tablet Generic drug: dapagliflozin propanediol Take 1 tablet by mouth daily   glipiZIDE 10 MG 24 hr tablet Commonly known as: GLUCOTROL XL Take 10 mg by mouth daily with breakfast.   lisinopril 20 MG tablet Commonly known as: ZESTRIL Take 1 tablet (20 mg total) by mouth daily. What changed: when to take this   lubiprostone 24 MCG capsule Commonly known as: Amitiza Take 2 capsules (48 mcg total) by mouth at bedtime.   metFORMIN 1000 MG tablet Commonly known as: GLUCOPHAGE Take 1,000 mg by mouth 2 (two) times daily with a meal.   methocarbamol 750 MG tablet Commonly known as: ROBAXIN Take 1 tablet (750 mg total) by mouth every 6 (six) hours as needed for muscle spasms.   multivitamin with minerals Tabs tablet Take 1 tablet by mouth  daily.   omeprazole 40 MG capsule Commonly known as: PRILOSEC Take 1 capsule by mouth daily   Oxycodone HCl 20 MG Tabs Take 1 tablet (20 mg total) by mouth every 6 (six) hours as needed. What changed:  reasons to take this Another medication with the same name was removed. Continue taking this medication, and follow the directions you see here.   Potassium Chloride ER 20 MEQ Tbcr Take 1 tablet by mouth daily What changed: how much to take   pregabalin 150 MG capsule Commonly known as: LYRICA Take 1 capsule by mouth two times daily What changed:  how much to take how to take this when  to take this   sildenafil 100 MG tablet Commonly known as: VIAGRA Take 100 mg by mouth as needed for erectile dysfunction.        Disposition and follow-up:   Thomas Mcclure was discharged from Thomas Mcclure in Stable condition.  At the Mcclure follow up visit please address:  1.  Follow-up:  a.  Please assess Thomas Mcclure' pain and make adjustments as possible.  Make sure he is not having any worsening anginal chest pain.  2.  Labs / imaging needed at time of follow-up: None. Consider EKG/troponin if patient is having chest pain  3.  Pending labs/ test needing follow-up: None  Follow-up Appointments:  Follow-up Information     Thomas Mcclure, [place], [place] Follow up.   Specialty: Cardiology Why: [place] location - a cardiology follow-up visit has been arranged for you on Wednesday August 2 at 10:55 AM (Arrive by 10:40 AM). Thomas Mcclure is one of the PAs that works closely with [name]. You may contact the office if this appointment time does not work for you. Contact information: [address] [place] [address] [phone]         Thomas Mcclure, [place]. Schedule an appointment as soon as possible for a visit in 1 week(s).   Specialty: Physician Assistant Contact information: [place] [address] [phone]         Thomas Crome, MD .   Specialty: Cardiology Contact information: [phone] [address] [address] [phone]                 Mcclure Course by problem list: Thomas Mcclure is a 66 y.o. male with PMH CAD with NSTEMI status post stent to RCA, systolic and diastolic heart failure, ischemic cardiomyopathy, T2DM, HTN, HLD, chronic back pain status post multiple level spinal fusions and spinal cord stimulator who presented with chest pain and back pain and admitted for atypical chest pain.  He was discharged following observation after ACS was ruled out.   #Atypical  chest pain #CAD with NSTEMI status post RCA stent #HTN/HLD Patient presents with likely musculoskeletal/pleuritic subscapular back pain following significant exertion.  Initial trops flat with a chronic history of minimally elevated troponins, EKG does not show STEMI.  Imaging rules out pneumonia, dissection, AAA, PE.  Blood pressures well managed in the ED.  Cardiology consulted, with low suspicion of ACS at this point.  Received heparin bolus in the ED, but this was stopped at admission.  Reporting no worsening chest pain or alarm symptoms on the day of discharge.  Of note, the patient reported he was already taking aspirin 81 at home.  I put in a formal prescription for aspirin as this was not already on his medication list.  #Subscapular back  pain #Chronic back pain status post spinal fusions and spinal cord stimulator Patient's presentation is more consistent with acute back pain/atypical chest pain.  Etiologies ruled out as above, and also additionally no acute fractures noted on imaging.  Patient has a history of multiple spinal fusions with spinal stimulator intact, also takes oxycodone 20 mg every 6 hours at home and has a contract with his pain clinic.  Takes Flexeril 10 every night at home for leg cramps.  Received Dilaudid and morphine in the ED.  Low concern for vertebral fracture or disc herniation at this point and no alarm symptoms, no further imaging indicated.  Changed the patient's Flexeril to Robaxin 750 every 6 hours and restarted his home pain regimen during admission.  On the morning of discharge, the patient reported slightly improved pain on the Robaxin and felt like he could go home on this regimen.  #T2DM with neuropathy A1c admission 6.6, continued patient's home diabetes medications without any acute concerns.    #Combined systolic and diastolic heart failure Echo in 2020 showing EF 45 to 50% with increased LV wall thickness and impaired relaxation.  Euvolemic on exam during  the admission without any acute concerns.  Continued patient's home Coreg and Wilder Glade and held lisinopril and chlorthalidone because his pressures were already well controlled.  Restarted home regimen on discharge.  Subjective: Patient with improved pain this morning.  No new chest pain, exertional dyspnea, nausea or vomiting reported.  Patient and family in the room decided they were amenable to discharge today.  They were instructed on return precautions and follow-up measures. Discharge Vitals:   BP 106/84   Pulse 73   Temp 98.7 F (37.1 C) (Oral)   Resp 13   Ht '5\' 9"'$  (1.753 m)   Wt 103.4 kg   SpO2 98%   BMI 33.66 kg/m  Discharge exam: Constitutional: Comfortable appearing male laying in bed, appears to be in pain and uncomfortable with movements and deep breathing cardiovascular: RRR, no murmurs, rubs or gallops Respiratory: Normal work of breathing on room air, lungs clear to auscultation bilaterally Abdominal: soft, non-tender, non-distended Extremities: No lower extremity edema noted, extremity pulses 2+ MSK/Skin: Sharp tenderness to palpation of right subscapular paraspinal muscle and soft tissue.  No clear point vertebral tenderness.  Small area of erythema overlying the right flank    Pertinent Labs, Studies, and Procedures:     Latest Ref Rng & Units 09/15/2021    9:00 AM 11/01/2020    9:38 AM 04/12/2019    3:07 AM  CBC  WBC 4.0 - 10.5 K/uL 6.9  7.4  10.1   Hemoglobin 13.0 - 17.0 g/dL 12.2  12.6  9.9   Hematocrit 39.0 - 52.0 % 38.4  40.4  31.8   Platelets 150 - 400 K/uL 222  262  288        Latest Ref Rng & Units 09/16/2021   12:42 AM 09/15/2021    9:00 AM 11/01/2020    9:38 AM  CMP  Glucose 70 - 99 mg/dL 162  110  143   BUN 8 - 23 mg/dL '17  16  24   '$ Creatinine 0.61 - 1.24 mg/dL 1.06  0.94  1.10   Sodium 135 - 145 mmol/L 140  139  137   Potassium 3.5 - 5.1 mmol/L 3.6  4.0  3.8   Chloride 98 - 111 mmol/L 101  105  100   CO2 22 - 32 mmol/L '30  26  25   '$ Calcium  8.9 -  10.3 mg/dL 9.6  9.2  9.4   Total Protein 6.5 - 8.1 g/dL   7.5   Total Bilirubin 0.3 - 1.2 mg/dL   0.5   Alkaline Phos 38 - 126 U/L   89   AST 15 - 41 U/L   23   ALT 0 - 44 U/L   23    BNP 10.7 Troponins 175 and 175  EKG showingnormal sinus rhythm, pronounced QRSs in V5, inferior leads with Q waves.no clear ST elevations present.  CT Angio Chest/Abd/Pel for Dissection W and/or W/WO  Result Date: 09/15/2021 CLINICAL DATA:  Chest pain, back pain, acute aortic syndrome suspected EXAM: CT ANGIOGRAPHY CHEST, ABDOMEN AND PELVIS TECHNIQUE: Non-contrast CT of the chest was initially obtained. Multidetector CT imaging through the chest, abdomen and pelvis was performed using the standard protocol during bolus administration of intravenous contrast. Multiplanar reconstructed images and MIPs were obtained and reviewed to evaluate the vascular anatomy. RADIATION DOSE REDUCTION: This exam was performed according to the departmental dose-optimization program which includes automated exposure control, adjustment of the mA and/or kV according to patient size and/or use of iterative reconstruction technique. CONTRAST:  175m OMNIPAQUE IOHEXOL 350 MG/ML SOLN COMPARISON:  Noncontrast CT done on 04/22/2021 FINDINGS: CTA CHEST FINDINGS Cardiovascular: There is no demonstrable intramural hematoma in the thoracic aorta in the noncontrast images of the chest. There is homogeneous enhancement in thoracic aorta. There is no demonstrable intimal flap. There are scattered calcifications. Coronary artery calcifications are seen. There is no evidence of central pulmonary artery embolism. Mediastinum/Nodes: No significant lymphadenopathy is seen. Lungs/Pleura: Centrilobular emphysema is seen. Scattered blebs and bullae are seen. There is no focal pulmonary consolidation. There is no pleural effusion or pneumothorax. Musculoskeletal: There is surgical fusion in lower thoracic spine and lumbar spine. Pain control lead is seen in the  thoracic spinal canal. Review of the MIP images confirms the above findings. CTA ABDOMEN AND PELVIS FINDINGS VASCULAR Aorta: Atherosclerotic plaques and calcifications are seen in the aorta without dissection or aneurysm Celiac: Unremarkable. SMA: Unremarkable. Renals: No significant stenosis. IMA: Patent. Inflow: Atherosclerotic plaques and calcifications are seen in the iliac arteries. There is ectasia of left common iliac artery. 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 Veins: Unremarkable. Review of the MIP images confirms the above findings. NON-VASCULAR Hepatobiliary: There is fatty infiltration. There is no dilation of bile ducts. Gallbladder is unremarkable. Pancreas: No focal abnormalities are seen. Spleen: Unremarkable. Adrenals/Urinary Tract: Mild hyperplasia of left adrenal has not changed. There is no hydronephrosis. There are no renal or ureteral stones. There is a 11 mm low-density in the lower pole of right kidney suggesting renal cysts. Urinary bladder is unremarkable. Stomach/Bowel: Stomach is unremarkable. Small bowel loops are nondilated. Appendix is not dilated. There is no significant wall thickening and colon. There is no pericolic stranding. Lymphatic: Unremarkable Reproductive: Coarse calcification is seen in the prostate. Other: There is no ascites or pneumoperitoneum. Right inguinal hernia containing fat is seen. Musculoskeletal: Degenerative changes are noted in lower thoracic spine and lumbar spine. There is surgical fusion in lower thoracic spine and lumbar spine. Laminectomy is seen in the lower lumbar region. There is previous right hip arthroplasty. Review of the MIP images confirms the above findings. IMPRESSION: There is no evidence of dissection in the thoracic and abdominal aorta. Major branches of thoracic and abdominal aorta are patent. Scattered atherosclerotic plaques and calcifications are seen in the aorta and its major branches.  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 Coronary artery calcifications are seen. There are no signs of pulmonary artery embolism. COPD.  No focal pulmonary infiltrates are seen. There is no evidence of intestinal obstruction or pneumoperitoneum. There is no hydronephrosis. Appendix is not dilated. Fatty liver. Other findings as described in the body of the report. Electronically Signed   By: Elmer Picker M.D.   On: 09/15/2021 11:42   DG Chest Port 1 View  Result Date: 09/15/2021 CLINICAL DATA:  Chest pain EXAM: PORTABLE CHEST 1 VIEW COMPARISON:  04/19/2018 FINDINGS: Normal heart size and mediastinal contours. Low volume chest. There is no edema, consolidation, effusion, or pneumothorax. Spinal fusion and spinal cord stimulator leads. IMPRESSION: No evidence of active disease. Electronically Signed   By: Jorje Guild M.D.   On: 09/15/2021 09:27     Discharge Instructions: Discharge Instructions     Call MD for:  difficulty breathing, headache or visual disturbances   Complete by: As directed    Call MD for:  persistant nausea and vomiting   Complete by: As directed    Call MD for:  severe uncontrolled pain   Complete by: As directed    Diet - low sodium heart healthy   Complete by: As directed    Discharge instructions   Complete by: As directed    Please call your doctor or come back to the ED to be evaluated if you are having worsening chest pain, trouble breathing, nausea vomiting.  1.  We think your back/chest pain was likely due to a muscle spasm or strain, or another musculoskeletal issue.  We are hoping the Robaxin can help with this.  We started that instead of your Flexeril because it will make you less drowsy and it should still be effective.  Otherwise we maintained you on your normal pain regimen that your pain clinic recommends. 2.  You can follow-up with orthopedics if your muscle pain continues.  Please also go to your cardiology  appointment in August and make an appointment with your primary care doctor in the next 1 to 2 weeks.   Increase activity slowly   Complete by: As directed        Discharge Instructions   None     Signed: Linus Galas, MD 09/16/2021, 4:22 PM   Pager: [phone]

## 2021-09-19 ENCOUNTER — Other Ambulatory Visit (HOSPITAL_COMMUNITY): Payer: Self-pay

## 2021-09-19 MED ORDER — METHOCARBAMOL 750 MG PO TABS
ORAL_TABLET | ORAL | 4 refills | Status: DC
Start: 2021-09-19 — End: 2022-04-04
  Filled 2021-09-19: qty 60, 30d supply, fill #0
  Filled 2021-10-18: qty 60, 30d supply, fill #1
  Filled 2021-11-19: qty 60, 30d supply, fill #2
  Filled 2021-12-23: qty 60, 30d supply, fill #3

## 2021-09-20 ENCOUNTER — Other Ambulatory Visit (HOSPITAL_COMMUNITY): Payer: Self-pay

## 2021-09-20 MED ORDER — OXYCODONE HCL 20 MG PO TABS
ORAL_TABLET | ORAL | 0 refills | Status: DC
  Filled 2021-09-23 (×3): qty 120, fill #0
  Filled 2021-09-25: qty 120, 30d supply, fill #0

## 2021-09-20 MED ORDER — PREGABALIN 150 MG PO CAPS
ORAL_CAPSULE | ORAL | 0 refills | Status: DC
  Filled 2021-09-20 – 2021-09-23 (×3): qty 60, 30d supply, fill #0

## 2021-09-20 MED ORDER — LUBIPROSTONE 24 MCG PO CAPS
ORAL_CAPSULE | ORAL | 0 refills | Status: DC
  Filled 2021-09-20: qty 180, 90d supply, fill #0

## 2021-09-23 ENCOUNTER — Other Ambulatory Visit (HOSPITAL_COMMUNITY): Payer: Self-pay

## 2021-09-24 ENCOUNTER — Other Ambulatory Visit (HOSPITAL_COMMUNITY): Payer: Self-pay

## 2021-09-25 ENCOUNTER — Ambulatory Visit: Payer: Medicare Other | Admitting: Physician Assistant

## 2021-09-25 ENCOUNTER — Other Ambulatory Visit (HOSPITAL_COMMUNITY): Payer: Self-pay

## 2021-09-25 ENCOUNTER — Ambulatory Visit: Payer: Medicare Other | Admitting: Nurse Practitioner

## 2021-09-26 NOTE — Progress Notes (Signed)
Cardiology Office Note:    Date:  09/27/2021   ID:  Thomas Mcclure, DOB [birth year], MRN [id number]  PCP:  Fredrich Romans, PA  Cardiologist:  Sinclair Grooms, MD   Referring MD: Fredrich Romans, [place]   Chief Complaint  Patient presents with   Coronary Artery Disease   Chest Pain   Hypertension   Hyperlipidemia    History of Present Illness:    Thomas Mcclure is a 66 y.o. male with a hx of  NSTEMI in 2017 status post DES to the RCA. 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 Cath in 2018 demonstrated patent stent.  Left iliac aneurysm (CT July 2023).   Hospital admission 09/15/2021 per discharge summary:  "#Atypical chest pain #CAD with NSTEMI status post RCA stent #HTN/HLD Patient presents with likely musculoskeletal/pleuritic subscapular back pain following significant exertion.  Initial trops flat with a chronic history of minimally elevated troponins, EKG does not show STEMI.  Imaging rules out pneumonia, dissection, AAA, PE.  Blood pressures well managed in the ED.  Cardiology consulted, with low suspicion of ACS at this point.  Received heparin bolus in the ED, but this was stopped at admission.  Reporting no worsening chest pain or alarm symptoms on the day of discharge.  Of note, the patient reported he was already taking aspirin 81 at home.  I put in a formal prescription for aspirin as this was not already on his medication list."    Since the admission, he has had no recurrence of the back discomfort.  PE and aortic dissection were excluded.  He was not felt to be having an acute coronary syndrome.  He likely had musculoskeletal discomfort related to crawling under his car the day before to get the spare tire and then change the tire on a hot day.  The discomfort occurred 36 hours late upon arising from  bed.  He has had no recurrence of the discomfort since then.  Past Medical History:  Diagnosis Date   Baker's cyst    Left calf   CAD in native artery, 07/15/15 PCI of RCA with DES 07/16/2015   a.   NSTEMI 5/17: LHC - pLAD 20, pLCx 20, OM1 30, pRCA 100, EF 45-50%>> PCI: 2.5 x 24 mm Promus DES to RCA  //  b.   Echo 5/17: mild LVH, EF 50-55%, no RWMA, mod RVE //  c. LHC 6/17: pLAD 20, pLCx 20, OM1 50, pRCA stent ok, EF 35-45% with mod sized inf wall and basal segment aneurysm   Chronic back pain    "down my back, down my legs" (02/18/2017)   Chronic combined systolic and diastolic HF (heart failure) (HCC)    Constipation    Constipation due to opioid therapy    GERD (gastroesophageal reflux disease)    04/07/2019- not current   Hyperlipidemia    Hypertension    Dr. [name] [phone]   Ischemic cardiomyopathy    a. LV-gram at time of LHC in 6/17 with EF 35-45%  //  b. 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"   Numbness and tingling of both lower extremities    "on the outside of both sides" (02/18/2017)  Rheumatoid arthritis (HCC)    RA   Tachycardia    Type II diabetes mellitus ([place])    diet controlled    Past Surgical History:  Procedure Laterality Date   BACK SURGERY     x3   bilateral cataract curgery      CARDIAC CATHETERIZATION N/A 07/15/2015   Procedure: Left Heart Cath and Coronary Angiography;  Surgeon: [name], MD;  Location: [facility];  Service: Cardiovascular;  Laterality: N/A;   CARDIAC CATHETERIZATION N/A 07/15/2015   Procedure: Coronary Stent Intervention;  Surgeon: [name], MD;  Location: [facility];  Service: Cardiovascular;  Laterality: N/A;   CARDIAC CATHETERIZATION N/A 08/07/2015   Procedure: Left Heart Cath and Coronary Angiography;  Surgeon: [name], MD;  Location: [facility];  Service: Cardiovascular;  Laterality: N/A;   CORONARY ANGIOPLASTY WITH STENT PLACEMENT  07/2015   LEFT HEART CATH AND CORONARY ANGIOGRAPHY N/A 02/19/2017   Procedure: LEFT HEART CATH AND CORONARY ANGIOGRAPHY;  Surgeon: Martinique, Peter M, MD;  Location: [place] [facility];  Service: Cardiovascular;  Laterality: N/A;   LUMBAR FUSION  04/11/2019   REVISION OF THORACOLUMBAR FUSION    LUMBAR SPINE SURGERY  03/2009  & 2012   MASS EXCISION N/A 04/19/2012   Procedure: removal of posterior cervical lipoma;  Surgeon: [name], MD;  Location: [facility];  Service: Neurosurgery;  Laterality: N/A;  Removal of posterior cervical lipoma   POPLITEAL SYNOVIAL CYST EXCISION Left    "opened up behind my knee; scraped out arthritis"   POSTERIOR LUMBAR FUSION 4 LEVEL N/A 11/22/2018   Procedure: Posterior Lateral and Interbody fusion - Lumbar one-Lumbar two; explore fusion, posterior instrumentation and fusion Thoracic ten to the ilium;  Surgeon: Newman Pies, MD;  Location: [facility];  Service: Neurosurgery;  Laterality: N/A;   SPINAL CORD STIMULATOR INSERTION N/A 01/07/2013   Procedure:  SPINAL CORD STIMULATOR INSERTION;  Surgeon: [name], MD;  Location: [facility];  Service: Neurosurgery;  Laterality: N/A;   SPINAL CORD STIMULATOR INSERTION N/A 02/09/2019   Procedure: REPLACEMENT OF LUMBAR SPINAL CORD STIMULATOR;  Surgeon: Newman Pies, MD;  Location: [facility];  Service: Neurosurgery;  Laterality: N/A;  Thoracic/Lumbar spine   TONSILLECTOMY     patient denies   TOTAL HIP ARTHROPLASTY Right 10/03/2016   Procedure: TOTAL HIP ARTHROPLASTY;  Surgeon: [name], MD;  Location: [facility];  Service: Orthopedics;  Laterality: Right;   TOTAL KNEE ARTHROPLASTY Right 11/09/2020   Procedure: TOTAL KNEE ARTHROPLASTY;  Surgeon: [name], MD;  Location: [facility];  Service: Orthopedics;  Laterality: Right;   WISDOM TOOTH EXTRACTION     hx of    Current  Medications: Current Meds  Medication Sig   ACCU-CHEK AVIVA PLUS test strip    Accu-Chek Softclix Lancets lancets 4 (four) times daily.   acetaminophen (TYLENOL) 500 MG tablet Take 1,000 mg by mouth 2 (two) times daily as needed (pain.).   aspirin 81 MG chewable tablet Chew 1 tablet (81 mg total) by mouth daily.   atorvastatin (LIPITOR) 80 MG tablet Take 80 mg by mouth daily at 6 PM.   Buprenorphine HCl (BELBUCA) 900 MCG FILM Place 1 film inside cheeks 2 times daily.   carvedilol (COREG) 12.5 MG tablet TAKE 1 TABLET BY MOUTH TWICE DAILY WITH A MEAL (Patient taking differently: Take 12.5 mg by mouth 2 (two) times daily with a meal.)   chlorhexidine (PERIDEX) 0.12 % solution 15 mLs by  Mouth Rinse route 2 (two) times daily.   chlorthalidone (HYGROTON) 25 MG tablet TAKE 1 TABLET BY MOUTH DAILY   dapagliflozin propanediol (FARXIGA) 10 MG TABS tablet Take 1 tablet by mouth daily   diclofenac Sodium (VOLTAREN) 1 % GEL Apply 1 Application topically 2 (two) times daily as needed for pain.   diltiazem (DILT-XR) 180 MG 24 hr capsule Take 1 capsule by mouth daily (Patient taking differently: Take 180 mg by mouth daily.)   docusate sodium (COLACE) 100 MG capsule Take 1 capsule (100 mg total) by mouth 2 (two) times daily. (Patient taking differently: Take 300 mg by mouth 2 (two) times daily.)   DULoxetine (CYMBALTA) 60 MG capsule Take 1 capsule by mouth daily (Patient taking differently: Take 60 mg by mouth daily.)   glipiZIDE (GLUCOTROL XL) 10 MG 24 hr tablet Take 10 mg by mouth daily with breakfast.   LINZESS 145 MCG CAPS capsule Take 145 mcg by mouth as needed.   lisinopril (ZESTRIL) 20 MG tablet Take 1 tablet (20 mg total) by mouth daily. (Patient taking differently: Take 20 mg by mouth at bedtime.)   lubiprostone (AMITIZA) 24 MCG capsule Take 2 capsules (48 mcg total) by mouth at bedtime.   lubiprostone (AMITIZA) 24 MCG capsule Take 2 (two) Capsules by mouth at bedtime   metFORMIN (GLUCOPHAGE) 1000 MG  tablet Take 1,000 mg by mouth 2 (two) times daily with a meal.   methocarbamol (ROBAXIN) 750 MG tablet Take 1 tablet (750 mg total) by mouth every 6 (six) hours as needed for muscle spasms.   methocarbamol (ROBAXIN) 750 MG tablet Take 1 tablet by mouth 2 times daily as needed.   Multiple Vitamin (MULTIVITAMIN WITH MINERALS) TABS tablet Take 1 tablet by mouth daily.   omeprazole (PRILOSEC) 40 MG capsule Take 1 capsule by mouth daily   Oxycodone HCl 20 MG TABS Take 1 tablet (20 mg total) by mouth every 6 (six) hours as needed. (Patient taking differently: Take 20 mg by mouth every 6 (six) hours as needed (pain).)   Oxycodone HCl 20 MG TABS Take 1 (one) tablet by mouth every six hours as needed   Potassium Chloride ER 20 MEQ TBCR Take 1 tablet by mouth daily (Patient taking differently: Take 20 mEq by mouth daily.)   predniSONE (STERAPRED UNI-PAK 48 TAB) 5 MG (48) TBPK tablet Take 10 mg by mouth 2 (two) times daily.   pregabalin (LYRICA) 150 MG capsule Take 1 capsule by mouth two times daily (Patient taking differently: Take 150 mg by mouth 2 (two) times daily.)   pregabalin (LYRICA) 150 MG capsule Take 1 capsule by mouth 2 times daily   sildenafil (VIAGRA) 100 MG tablet Take 100 mg by mouth as needed for erectile dysfunction.     Allergies:   Brilinta [ticagrelor], Methylprednisolone, Prednisone, Toradol [ketorolac tromethamine], and Adhesive [tape]   Social History   Socioeconomic History   Marital status: Married    Spouse name: Not on file   Number of children: 5   Years of education: Not on file   Highest education level: Not on file  Occupational History   Occupation: Development worker, community, now on disability due to back pain  Tobacco Use   Smoking status: Former    Packs/day: 0.25    Years: 40.00    Total pack years: 10.00    Types: Cigarettes    Quit date: 2008    Years since quitting: 15.6   Smokeless tobacco: Never   Tobacco comments:  02/18/2017 "quit in 2012"  Vaping  Use   Vaping Use: Never used  Substance and Sexual Activity   Alcohol use: No    Alcohol/week: 0.0 standard drinks of alcohol   Drug use: No   Sexual activity: Not Currently  Other Topics Concern   Not on file  Social History Narrative   Not on file   Social Determinants of Health   Financial Resource Strain: Not on file  Food Insecurity: Not on file  Transportation Needs: Not on file  Physical Activity: Not on file  Stress: Not on file  Social Connections: Not on file     Family History: The patient's family history includes Heart disease in his mother; Prostate cancer in his brother.  ROS:   Please see the history of present illness.    Chronic back pain.  Limited mobility.  Still smokes cigarettes.  All other systems reviewed and are negative.  EKGs/Labs/Other Studies Reviewed:    The following studies were reviewed today:  CT scan, chest/abdomen/pelvis for rule out dissection 09/15/2021: IMPRESSION: There is no evidence of dissection in the thoracic and abdominal aorta. Major branches of thoracic and abdominal aorta are patent. Scattered atherosclerotic plaques and calcifications are seen in the aorta and its major branches. 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 Coronary artery calcifications are seen. There are no signs of pulmonary artery embolism.   COPD.  No focal pulmonary infiltrates are seen.   There is no evidence of intestinal obstruction or pneumoperitoneum. There is no hydronephrosis. Appendix is not dilated.   Fatty liver.   Other findings as described in the body of the report.   EKG:  EKG performed 7/20 04/2021, old inferior infarct, sinus rhythm/left atrial abnormality.  Nonspecific T wave flattening.  No acute change.  Recent Labs: 11/01/2020: ALT 23 09/15/2021: B Natriuretic Peptide 10.7; Hemoglobin 12.2; Platelets 222 09/16/2021: BUN 17; Creatinine, Ser 1.06; Potassium 3.6; Sodium 140  Recent Lipid  Panel    Component Value Date/Time   CHOL 105 09/16/2021 0042   CHOL 111 04/26/2018 1035   TRIG 89 09/16/2021 0042   HDL 31 (L) 09/16/2021 0042   HDL 31 (L) 04/26/2018 1035   CHOLHDL 3.4 09/16/2021 0042   VLDL 18 09/16/2021 0042   LDLCALC 56 09/16/2021 0042   LDLCALC 57 04/26/2018 1035    Physical Exam:    VS:  BP 116/64   Pulse 83   Ht '5\' 9"'$  (1.753 m)   Wt 234 lb (106.1 kg)   SpO2 93%   BMI 34.56 kg/m     Wt Readings from Last 3 Encounters:  09/27/21 234 lb (106.1 kg)  09/15/21 227 lb 15.3 oz (103.4 kg)  11/09/20 251 lb 6.5 oz (114 kg)     GEN: 17 pound weight loss since September 2022.. No acute distress HEENT: Normal NECK: No JVD. LYMPHATICS: No lymphadenopathy CARDIAC: No murmur. RRR no gallop, or edema. VASCULAR:  Normal Pulses. No bruits. RESPIRATORY:  Clear to auscultation without rales, wheezing or rhonchi  ABDOMEN: Soft, non-tender, non-distended, No pulsatile mass, MUSCULOSKELETAL: No deformity  SKIN: Warm and dry NEUROLOGIC:  Alert and oriented x 3 PSYCHIATRIC:  Normal affect   ASSESSMENT:    1. Coronary artery disease involving native coronary artery of native heart without angina pectoris   2. Chronic combined systolic and diastolic CHF (congestive heart failure) ([place])   3. Essential hypertension   4. Hyperlipidemia with target LDL less than 100  5. Controlled type 2 diabetes mellitus with complication, without long-term current use of insulin ([place])   6. Aneurysm of left iliac artery (HCC)    PLAN:    In order of problems listed above:  Continue aggressive risk factor modification.  Smoking cessation.  Aggressive lipid-lowering and blood pressure control.  Encourage 150 minutes of moderate activity per week (very difficult to achieve because of lumbosacral spine disease). Continue carvedilol, Farxiga, Zestril, and high-grade time for decongestion.  An echocardiogram was not done as a part of the recent hospitalization.  This should probably be  repeated within the next 6 to 12 months for surveillance. Blood pressures well controlled on the current regimen noted: Carvedilol, hypertension, diltiazem Zestril.  If EF is still less than normal, Eliquis should probably be discontinued. Continue Farxiga. Have the patient seen by vascular surgery to establish management of the iliac aneurysm.   Medication Adjustments/Labs and Tests Ordered: Current medicines are reviewed at length with the patient today.  Concerns regarding medicines are outlined above.  Orders Placed This Encounter  Procedures   Ambulatory referral to Vascular Surgery   No orders of the defined types were placed in this encounter.   Patient Instructions  Medication Instructions:  Your physician recommends that you continue on your current medications as directed. Please refer to the Current Medication list given to you today.  *If you need a refill on your cardiac medications before your next appointment, please call your pharmacy*  Lab Work: NONE  Testing/Procedures: NONE  Follow-Up: At [facility], you and your health needs are our priority.  As part of our continuing mission to provide you with exceptional heart care, we have created designated Provider Care Teams.  These Care Teams include your primary Cardiologist (physician) and Advanced Practice Providers (APPs -  Physician Assistants and Nurse Practitioners) who all work together to provide you with the care you need, when you need it.  Your next appointment:   9-12 month(s)  The format for your next appointment:   In Person  Provider:   Sinclair Grooms, MD {  Other Instructions You have been referred to [facility] for follow-up on aneurysm of left iliac artery. Their office will call you to schedule an appointment with one of their physicians.  Important Information About Sugar         Signed, Sinclair Grooms, MD  09/27/2021 12:48 PM    [facility]  HeartCare

## 2021-09-27 ENCOUNTER — Encounter: Payer: Self-pay | Admitting: Interventional Cardiology

## 2021-09-27 ENCOUNTER — Ambulatory Visit (INDEPENDENT_AMBULATORY_CARE_PROVIDER_SITE_OTHER): Payer: Medicare Other | Admitting: Interventional Cardiology

## 2021-09-27 ENCOUNTER — Ambulatory Visit: Payer: Medicare Other | Admitting: Interventional Cardiology

## 2021-09-27 VITALS — BP 116/64 | HR 83 | Ht 69.0 in | Wt 234.0 lb

## 2021-09-27 DIAGNOSIS — I723 Aneurysm of iliac artery: Secondary | ICD-10-CM

## 2021-09-27 DIAGNOSIS — I2510 Atherosclerotic heart disease of native coronary artery without angina pectoris: Secondary | ICD-10-CM

## 2021-09-27 DIAGNOSIS — I5042 Chronic combined systolic (congestive) and diastolic (congestive) heart failure: Secondary | ICD-10-CM | POA: Diagnosis not present

## 2021-09-27 DIAGNOSIS — E785 Hyperlipidemia, unspecified: Secondary | ICD-10-CM | POA: Diagnosis not present

## 2021-09-27 DIAGNOSIS — I10 Essential (primary) hypertension: Secondary | ICD-10-CM | POA: Diagnosis not present

## 2021-09-27 DIAGNOSIS — E118 Type 2 diabetes mellitus with unspecified complications: Secondary | ICD-10-CM

## 2021-09-27 NOTE — Patient Instructions (Addendum)
Medication Instructions:  Your physician recommends that you continue on your current medications as directed. Please refer to the Current Medication list given to you today.  *If you need a refill on your cardiac medications before your next appointment, please call your pharmacy*  Lab Work: NONE  Testing/Procedures: NONE  Follow-Up: At [facility], you and your health needs are our priority.  As part of our continuing mission to provide you with exceptional heart care, we have created designated Provider Care Teams.  These Care Teams include your primary Cardiologist (physician) and Advanced Practice Providers (APPs -  Physician Assistants and Nurse Practitioners) who all work together to provide you with the care you need, when you need it.  Your next appointment:   9-12 month(s)  The format for your next appointment:   In Person  Provider:   Sinclair Grooms, MD {  Other Instructions You have been referred to [facility] for follow-up on aneurysm of left iliac artery. Their office will call you to schedule an appointment with one of their physicians.  Important Information About Sugar

## 2021-10-03 ENCOUNTER — Encounter: Payer: Self-pay | Admitting: Vascular Surgery

## 2021-10-03 ENCOUNTER — Telehealth: Payer: Self-pay | Admitting: Interventional Cardiology

## 2021-10-03 ENCOUNTER — Ambulatory Visit (INDEPENDENT_AMBULATORY_CARE_PROVIDER_SITE_OTHER): Payer: Medicare Other | Admitting: Vascular Surgery

## 2021-10-03 VITALS — BP 106/69 | HR 73 | Temp 98.3°F | Resp 20 | Ht 69.0 in | Wt 237.0 lb

## 2021-10-03 DIAGNOSIS — I723 Aneurysm of iliac artery: Secondary | ICD-10-CM | POA: Diagnosis not present

## 2021-10-03 MED ORDER — CARVEDILOL 12.5 MG PO TABS: 12.5000 mg | ORAL_TABLET | Freq: Two times a day (BID) | ORAL | 2 refills | Status: DC

## 2021-10-03 MED ORDER — CHLORTHALIDONE 25 MG PO TABS: 25.0000 mg | ORAL_TABLET | Freq: Every day | ORAL | 3 refills | Status: DC

## 2021-10-03 NOTE — Telephone Encounter (Signed)
*  STAT* If patient is at the pharmacy, call can be transferred to refill team.   1. Which medications need to be refilled? (please list name of each medication and dose if known)   chlorthalidone (HYGROTON) 25 MG tablet carvedilol (COREG) 12.5 MG tablet  2. Which pharmacy/location (including street and city if local pharmacy) is medication to be sent to? [place], [place]  3. Do they need a 30 day or 90 day supply? 90 day supply

## 2021-10-03 NOTE — Telephone Encounter (Signed)
Pt c/o medication issue:  1. Name of Medication: Potassium Chloride ER 20 MEQ TBCR atorvastatin (LIPITOR) 80 MG tablet  2. How are you currently taking this medication (dosage and times per day)?  3. Are you having a reaction (difficulty breathing--STAT)? No  4. What is your medication issue? Patient's PCP took the patient off of potasium today due to his numbers being good. They also lowered his atorvastatin to 20 MG. Wife wanted to make patient aware.

## 2021-10-03 NOTE — Progress Notes (Signed)
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  He does not have an abdominal aortic aneurysm or common iliac artery aneurysms.  Iliacs are slightly ectatic.  Given the location of the aneurysm in the pelvis I think this will have to be followed with CT scan.  This is quite small and therefore I think a follow-up CT in 18 months is reasonable.  I have ordered that study and we will see him back at that time.  We would only consider addressing this if it enlarges significantly.  If it did enlarge significantly it could likely be addressed with a covered stent and embolization.  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  The consult is requested by Dr. Daneen Schick.  HPI:   Thomas Mcclure is a 66 y.o. male who had presented with chest pain and back pain to the emergency department.  It was felt that he could potentially have an acute aortic syndrome which prompted a CT angio of the chest abdomen and pelvis.  This did not show any evidence of dissection.  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  The patient was sent for vascular consultation.  On my history the patient has a history of back pain.  He has had 7 previous back operations he tells me.  He got a flat tire and had to get under the truck to get the tire and developed significant back pain.  That is when he presented to the emergency department.  He is still having some back pain but this has been getting better.  He denies any significant chest pain.  His blood pressure has been under excellent control.  He quit smoking in 2008.  He is unaware of any family history of aneurysmal disease.  I do not get any history of claudication, rest pain, or nonhealing ulcers.  His risk factors for peripheral arterial disease include type 2 diabetes, hypertension, hypercholesterolemia,  and a remote history of tobacco use.  He denies any family history of premature cardiovascular disease  Past Medical History:  Diagnosis Date   Baker's cyst    Left calf   CAD in native artery, 07/15/15 PCI of RCA with DES 07/16/2015   a.   NSTEMI 5/17: LHC - pLAD 20, pLCx 20, OM1 30, pRCA 100, EF 45-50%>> PCI: 2.5 x 24 mm Promus DES to RCA  //  b.   Echo 5/17: mild LVH, EF 50-55%, no RWMA, mod RVE //  c. LHC 6/17: pLAD 20, pLCx 20, OM1 50, pRCA stent ok, EF 35-45% with mod sized inf wall and basal segment aneurysm   Chronic back pain    "down my back, down my legs" (02/18/2017)   Chronic combined systolic and diastolic HF (heart failure) (HCC)    Constipation    Constipation due to opioid therapy    GERD (gastroesophageal reflux disease)    04/07/2019- not current   Hyperlipidemia    Hypertension    Dr. [name] [phone]   Ischemic cardiomyopathy    a. LV-gram at time of LHC in 6/17 with EF 35-45%  //  b. 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"  Numbness and tingling of both lower extremities    "on the outside of both sides" (02/18/2017)   Rheumatoid arthritis (HCC)    RA   Tachycardia    Type II diabetes mellitus ([place])    diet controlled    Family History  Problem Relation Age of Onset   Heart disease Mother    Prostate cancer Brother     SOCIAL HISTORY: Social History   Tobacco Use   Smoking status: Former    Packs/day: 0.25    Years: 40.00    Total pack years: 10.00    Types: Cigarettes    Quit date: 2008    Years since quitting: 15.6   Smokeless tobacco: Never   Tobacco comments:    02/18/2017 "quit in 2012"  Substance Use Topics   Alcohol use: No    Alcohol/week: 0.0 standard drinks of alcohol    Allergies  Allergen Reactions   Brilinta [Ticagrelor] Shortness Of Breath  and Other (See Comments)    CHEST PAIN    Methylprednisolone Other (See Comments)    DIAPHORESIS, HYPOTENSION   Prednisone Other (See Comments)    DIAPHORESIS, HYPOTENSION   Toradol [Ketorolac Tromethamine] Other (See Comments)    DIAPHORESIS, HYPOTENSION     Adhesive [Tape] Rash and Other (See Comments)    "blisters" ( NO SURGICAL TAPE, PLEASE)    Current Outpatient Medications  Medication Sig Dispense Refill   ACCU-CHEK AVIVA PLUS test strip      Accu-Chek Softclix Lancets lancets 4 (four) times daily.     acetaminophen (TYLENOL) 500 MG tablet Take 1,000 mg by mouth 2 (two) times daily as needed (pain.).     aspirin 81 MG chewable tablet Chew 1 tablet (81 mg total) by mouth daily. 30 tablet 2   atorvastatin (LIPITOR) 20 MG tablet Take 20 mg by mouth daily.     Buprenorphine HCl (BELBUCA) 900 MCG FILM Place 1 film inside cheeks 2 times daily. 60 each 0   carvedilol (COREG) 12.5 MG tablet Take 1 tablet (12.5 mg total) by mouth 2 (two) times daily with a meal. 180 tablet 3   chlorhexidine (PERIDEX) 0.12 % solution 15 mLs by Mouth Rinse route 2 (two) times daily.     chlorthalidone (HYGROTON) 25 MG tablet Take 1 tablet (25 mg total) by mouth daily. 90 tablet 3   dapagliflozin propanediol (FARXIGA) 10 MG TABS tablet Take 1 tablet by mouth daily 90 tablet 0   diclofenac Sodium (VOLTAREN) 1 % GEL Apply 1 Application topically 2 (two) times daily as needed for pain.     diltiazem (DILT-XR) 180 MG 24 hr capsule Take 1 capsule by mouth daily (Patient taking differently: Take 180 mg by mouth daily.) 30 capsule 0   docusate sodium (COLACE) 100 MG capsule Take 1 capsule (100 mg total) by mouth 2 (two) times daily. (Patient taking differently: Take 300 mg by mouth 2 (two) times daily.) 10 capsule 0   DULoxetine (CYMBALTA) 60 MG capsule Take 1 capsule by mouth daily (Patient taking differently: Take 60 mg by mouth daily.) 90 capsule 1   glipiZIDE (GLUCOTROL XL) 10 MG 24 hr tablet Take 10 mg by mouth  daily with breakfast.     LINZESS 145 MCG CAPS capsule Take 145 mcg by mouth as needed.     lisinopril (ZESTRIL) 20 MG tablet Take 1 tablet (20 mg total) by mouth daily. (Patient taking differently: Take 20 mg by mouth at bedtime.) 90 tablet 1   lubiprostone (AMITIZA) 24 MCG capsule  Take 2 capsules (48 mcg total) by mouth at bedtime. 180 capsule 0   lubiprostone (AMITIZA) 24 MCG capsule Take 2 (two) Capsules by mouth at bedtime 180 capsule 0   metFORMIN (GLUCOPHAGE) 1000 MG tablet Take 1,000 mg by mouth 2 (two) times daily with a meal.     methocarbamol (ROBAXIN) 750 MG tablet Take 1 tablet (750 mg total) by mouth every 6 (six) hours as needed for muscle spasms. 40 tablet 2   methocarbamol (ROBAXIN) 750 MG tablet Take 1 tablet by mouth 2 times daily as needed. 60 tablet 4   Multiple Vitamin (MULTIVITAMIN WITH MINERALS) TABS tablet Take 1 tablet by mouth daily.     omeprazole (PRILOSEC) 40 MG capsule Take 1 capsule by mouth daily 90 capsule 0   Oxycodone HCl 20 MG TABS Take 1 tablet (20 mg total) by mouth every 6 (six) hours as needed. (Patient taking differently: Take 20 mg by mouth every 6 (six) hours as needed (pain).) 120 tablet 0   Oxycodone HCl 20 MG TABS Take 1 (one) tablet by mouth every six hours as needed 120 tablet 0   predniSONE (STERAPRED UNI-PAK 48 TAB) 5 MG (48) TBPK tablet Take 10 mg by mouth 2 (two) times daily.     pregabalin (LYRICA) 150 MG capsule Take 1 capsule by mouth two times daily (Patient taking differently: Take 150 mg by mouth 2 (two) times daily.) 60 capsule 0   pregabalin (LYRICA) 150 MG capsule Take 1 capsule by mouth 2 times daily 60 capsule 0   sildenafil (VIAGRA) 100 MG tablet Take 100 mg by mouth as needed for erectile dysfunction.     Potassium Chloride ER 20 MEQ TBCR Take 1 tablet by mouth daily (Patient not taking: Reported on 10/03/2021) 30 tablet 0   No current facility-administered medications for this visit.    REVIEW OF SYSTEMS:  '[X]'$  denotes positive  finding, '[ ]'$  denotes negative finding Cardiac  Comments:  Chest pain or chest pressure:    Shortness of breath upon exertion:    Short of breath when lying flat:    Irregular heart rhythm:        Vascular    Pain in calf, thigh, or hip brought on by ambulation:    Pain in feet at night that wakes you up from your sleep:     Blood clot in your veins:    Leg swelling:         Pulmonary    Oxygen at home:    Productive cough:     Wheezing:         Neurologic    Sudden weakness in arms or legs:     Sudden numbness in arms or legs:     Sudden onset of difficulty speaking or slurred speech:    Temporary loss of vision in one eye:     Problems with dizziness:         Gastrointestinal    Blood in stool:     Vomited blood:         Genitourinary    Burning when urinating:     Blood in urine:        Psychiatric    Major depression:         Hematologic    Bleeding problems:    Problems with blood clotting too easily:        Skin    Rashes or ulcers:        Constitutional    Fever  or chills:    -  PHYSICAL EXAM:   Vitals:   10/03/21 1403  BP: 106/69  Pulse: 73  Resp: 20  Temp: 98.3 F (36.8 C)  SpO2: 94%  Weight: 237 lb (107.5 kg)  Height: '5\' 9"'$  (1.753 m)   Body mass index is 35 kg/m. GENERAL: The patient is a well-nourished male, in no acute distress. The vital signs are documented above. CARDIAC: There is a regular rate and rhythm.  VASCULAR: I do not detect carotid bruits. He has palpable femoral, popliteal, dorsalis pedis, posterior tibial pulses bilaterally. He has no significant lower extremity swelling. PULMONARY: There is good air exchange bilaterally without wheezing or rales. ABDOMEN: Soft and non-tender with normal pitched bowel sounds.  MUSCULOSKELETAL: There are no major deformities. NEUROLOGIC: No focal weakness or paresthesias are detected. SKIN: There are no ulcers or rashes noted. PSYCHIATRIC: The patient has a normal affect.  DATA:     CT ANGIO CHEST ABDOMEN PELVIS: I reviewed the images of his CT angio of the chest abdomen and pelvis.  He had no evidence of abdominal aortic aneurysm  The common iliac arteries were slightly ectatic.  On the left side the artery measured 1.5 cm in maximum diameter.  The left side was a little bit smaller than that.  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  Deitra Mayo [facility]

## 2021-10-03 NOTE — Telephone Encounter (Signed)
Pt's medications were sent to pt's pharmacy as requested. Confirmation received.  

## 2021-10-18 ENCOUNTER — Other Ambulatory Visit (HOSPITAL_COMMUNITY): Payer: Self-pay

## 2021-10-19 ENCOUNTER — Other Ambulatory Visit (HOSPITAL_COMMUNITY): Payer: Self-pay

## 2021-10-21 ENCOUNTER — Other Ambulatory Visit (HOSPITAL_COMMUNITY): Payer: Self-pay

## 2021-10-22 ENCOUNTER — Other Ambulatory Visit (HOSPITAL_COMMUNITY): Payer: Self-pay

## 2021-10-22 MED ORDER — PREGABALIN 150 MG PO CAPS
ORAL_CAPSULE | ORAL | 0 refills | Status: DC
Start: 2021-10-22 — End: 2022-03-24
  Filled 2021-10-22 – 2021-10-25 (×2): qty 60, 30d supply, fill #0

## 2021-10-22 MED ORDER — BELBUCA 900 MCG BU FILM
ORAL_FILM | BUCCAL | 0 refills | Status: DC
  Filled 2021-10-22: qty 6, 3d supply, fill #0
  Filled 2021-10-22: qty 54, 27d supply, fill #0

## 2021-10-22 MED ORDER — ATORVASTATIN CALCIUM 20 MG PO TABS
20.0000 mg | ORAL_TABLET | Freq: Every evening | ORAL | 1 refills | Status: DC
  Filled 2021-10-22: qty 30, 30d supply, fill #0

## 2021-10-22 MED ORDER — LISINOPRIL 20 MG PO TABS
20.0000 mg | ORAL_TABLET | Freq: Every day | ORAL | 1 refills | Status: DC
  Filled 2021-10-22 – 2021-12-30 (×4): qty 90, 90d supply, fill #0

## 2021-10-22 MED ORDER — LINZESS 145 MCG PO CAPS
145.0000 ug | ORAL_CAPSULE | Freq: Every day | ORAL | 3 refills | Status: DC
  Filled 2021-10-22 – 2021-12-24 (×3): qty 90, 90d supply, fill #0
  Filled 2022-04-13 – 2022-05-27 (×14): qty 90, 90d supply, fill #1

## 2021-10-22 MED ORDER — OMEPRAZOLE 40 MG PO CPDR
40.0000 mg | DELAYED_RELEASE_CAPSULE | Freq: Every day | ORAL | 0 refills | Status: DC
  Filled 2021-10-22 – 2021-11-20 (×2): qty 90, 90d supply, fill #0

## 2021-10-22 MED ORDER — POTASSIUM CHLORIDE ER 20 MEQ PO TBCR
1.0000 | EXTENDED_RELEASE_TABLET | Freq: Every day | ORAL | 0 refills | Status: DC
  Filled 2021-10-22: qty 90, 90d supply, fill #0

## 2021-10-22 MED ORDER — DULOXETINE HCL 60 MG PO CPEP
60.0000 mg | ORAL_CAPSULE | Freq: Every day | ORAL | 1 refills | Status: DC
  Filled 2021-10-22: qty 90, 90d supply, fill #0
  Filled 2022-03-26: qty 90, 90d supply, fill #1

## 2021-10-22 MED ORDER — LUBIPROSTONE 24 MCG PO CAPS
48.0000 ug | ORAL_CAPSULE | Freq: Every evening | ORAL | 0 refills | Status: DC
  Filled 2021-10-22 – 2022-03-26 (×2): qty 180, 90d supply, fill #0

## 2021-10-22 MED ORDER — OXYCODONE HCL 20 MG PO TABS
ORAL_TABLET | ORAL | 0 refills | Status: DC
  Filled 2021-10-22: qty 120, 30d supply, fill #0
  Filled 2021-10-23: qty 120, fill #0
  Filled 2021-10-25: qty 120, 30d supply, fill #0

## 2021-10-22 MED ORDER — METFORMIN HCL 1000 MG PO TABS
1000.0000 mg | ORAL_TABLET | Freq: Two times a day (BID) | ORAL | 1 refills | Status: DC
Start: 2021-10-22 — End: 2022-12-08
  Filled 2021-10-22 – 2022-03-26 (×2): qty 180, 90d supply, fill #0
  Filled 2022-06-24 – 2022-08-12 (×4): qty 180, 90d supply, fill #1

## 2021-10-22 MED ORDER — GLIPIZIDE ER 10 MG PO TB24
20.0000 mg | ORAL_TABLET | Freq: Every day | ORAL | 1 refills | Status: DC
  Filled 2021-10-22 – 2022-03-26 (×4): qty 90, 45d supply, fill #0
  Filled 2022-05-09 (×2): qty 90, 45d supply, fill #1

## 2021-10-23 ENCOUNTER — Other Ambulatory Visit (HOSPITAL_COMMUNITY): Payer: Self-pay

## 2021-10-25 ENCOUNTER — Other Ambulatory Visit (HOSPITAL_COMMUNITY): Payer: Self-pay

## 2021-10-26 ENCOUNTER — Other Ambulatory Visit (HOSPITAL_COMMUNITY): Payer: Self-pay

## 2021-11-04 ENCOUNTER — Other Ambulatory Visit (HOSPITAL_COMMUNITY): Payer: Self-pay

## 2021-11-04 MED ORDER — LINZESS 145 MCG PO CAPS
145.0000 ug | ORAL_CAPSULE | Freq: Every day | ORAL | 3 refills | Status: DC
  Filled 2021-11-04 – 2022-03-20 (×3): qty 90, 90d supply, fill #0

## 2021-11-04 MED ORDER — ATORVASTATIN CALCIUM 20 MG PO TABS
20.0000 mg | ORAL_TABLET | Freq: Every day | ORAL | 1 refills | Status: DC
  Filled 2021-11-04 – 2022-02-19 (×4): qty 90, 90d supply, fill #0
  Filled 2022-04-26 – 2022-05-20 (×2): qty 90, 90d supply, fill #1

## 2021-11-04 MED ORDER — LUBIPROSTONE 24 MCG PO CAPS
48.0000 ug | ORAL_CAPSULE | Freq: Every evening | ORAL | 1 refills | Status: DC
  Filled 2021-11-04: qty 180, 90d supply, fill #0

## 2021-11-08 ENCOUNTER — Other Ambulatory Visit (HOSPITAL_COMMUNITY): Payer: Self-pay

## 2021-11-13 ENCOUNTER — Other Ambulatory Visit (HOSPITAL_COMMUNITY): Payer: Self-pay

## 2021-11-16 ENCOUNTER — Other Ambulatory Visit (HOSPITAL_COMMUNITY): Payer: Self-pay

## 2021-11-16 MED ORDER — OXYCODONE HCL 20 MG PO TABS
20.0000 mg | ORAL_TABLET | Freq: Four times a day (QID) | ORAL | 0 refills | Status: DC | PRN
  Filled 2021-11-16 – 2021-11-23 (×2): qty 120, 30d supply, fill #0

## 2021-11-16 MED ORDER — BELBUCA 900 MCG BU FILM
1.0000 | ORAL_FILM | Freq: Two times a day (BID) | BUCCAL | 0 refills | Status: DC
  Filled 2021-11-16 – 2021-11-21 (×3): qty 60, 30d supply, fill #0

## 2021-11-16 MED ORDER — PREGABALIN 150 MG PO CAPS
150.0000 mg | ORAL_CAPSULE | Freq: Two times a day (BID) | ORAL | 0 refills | Status: DC
  Filled 2021-11-20 – 2021-11-27 (×2): qty 60, 30d supply, fill #0

## 2021-11-16 MED ORDER — NALOXONE HCL 4 MG/0.1ML NA LIQD
1.0000 | NASAL | 3 refills | Status: AC | PRN
  Filled 2021-11-16: qty 2, 2d supply, fill #0
  Filled 2022-06-09: qty 2, 2d supply, fill #1
  Filled 2022-08-25: qty 2, 2d supply, fill #2

## 2021-11-19 ENCOUNTER — Other Ambulatory Visit (HOSPITAL_COMMUNITY): Payer: Self-pay

## 2021-11-20 ENCOUNTER — Other Ambulatory Visit (HOSPITAL_COMMUNITY): Payer: Self-pay

## 2021-11-21 ENCOUNTER — Other Ambulatory Visit (HOSPITAL_COMMUNITY): Payer: Self-pay

## 2021-11-23 ENCOUNTER — Other Ambulatory Visit (HOSPITAL_COMMUNITY): Payer: Self-pay

## 2021-11-27 ENCOUNTER — Other Ambulatory Visit (HOSPITAL_COMMUNITY): Payer: Self-pay

## 2021-11-28 ENCOUNTER — Other Ambulatory Visit (HOSPITAL_COMMUNITY): Payer: Self-pay

## 2021-12-02 ENCOUNTER — Ambulatory Visit: Payer: Medicare Other | Admitting: Interventional Cardiology

## 2021-12-03 ENCOUNTER — Other Ambulatory Visit (HOSPITAL_COMMUNITY): Payer: Self-pay

## 2021-12-11 ENCOUNTER — Telehealth: Payer: Self-pay | Admitting: Interventional Cardiology

## 2021-12-11 MED ORDER — CHLORTHALIDONE 25 MG PO TABS
25.0000 mg | ORAL_TABLET | Freq: Every day | ORAL | 3 refills | Status: DC
  Filled 2022-01-28: qty 90, 90d supply, fill #0

## 2021-12-11 NOTE — Telephone Encounter (Signed)
*  STAT* If patient is at the pharmacy, call can be transferred to refill team.   1. Which medications need to be refilled? (please list name of each medication and dose if known)  chlorthalidone (HYGROTON) 25 MG tablet  2. Which pharmacy/location (including street and city if local pharmacy) is medication to be sent to? [place], [place]  3. Do they need a 30 day or 90 day supply?  90 day supply

## 2021-12-11 NOTE — Telephone Encounter (Signed)
Pt's medication was sent to pt's pharmacy as requested. Confirmation received.  °

## 2021-12-11 NOTE — Telephone Encounter (Signed)
Spoke with the patient's wife who states that the patient's PCP stopped his potassium supplement. She states that she never rechecked the levels. Wife is concerned because patient has been stumbling and falling more frequently. She states that he does have a spinal stimulator that sometimes gives him trouble. She states that she is concerned about him and does not know what they need to do. I have advised her to call the patient's PCP. They will need to recheck his lab work and get him in for evaluation in regards to his balance concerns. She verbalized understanding.

## 2021-12-11 NOTE — Telephone Encounter (Signed)
Patient's wife states new PCP discontinued patient's Potassium because he eats bananas. Spouse states the patient has been off of Potassium  2 months. She states since then the patient has been stumbling and has not had repeat lab work to recheck potassium levels. She would like to know if [name] has recommendations.

## 2021-12-12 ENCOUNTER — Other Ambulatory Visit (HOSPITAL_COMMUNITY): Payer: Self-pay

## 2021-12-13 ENCOUNTER — Other Ambulatory Visit (HOSPITAL_COMMUNITY): Payer: Self-pay

## 2021-12-13 MED ORDER — DICLOFENAC SODIUM 1 % EX GEL
CUTANEOUS | 2 refills | Status: DC
  Filled 2022-03-26: qty 100, 30d supply, fill #0
  Filled 2022-04-26: qty 100, 30d supply, fill #1
  Filled 2022-05-27: qty 100, 30d supply, fill #2

## 2021-12-13 MED ORDER — CHLORTHALIDONE 25 MG PO TABS: 25.0000 mg | ORAL_TABLET | Freq: Every day | ORAL | 0 refills | Status: DC

## 2021-12-13 MED ORDER — POTASSIUM CHLORIDE CRYS ER 20 MEQ PO TBCR: 20.0000 meq | EXTENDED_RELEASE_TABLET | Freq: Every day | ORAL | 0 refills | Status: DC

## 2021-12-13 MED ORDER — DULOXETINE HCL 60 MG PO CPEP
60.0000 mg | ORAL_CAPSULE | Freq: Every day | ORAL | 1 refills | Status: DC
  Filled 2022-01-22: qty 90, 90d supply, fill #0
  Filled 2022-04-17 – 2022-06-09 (×13): qty 90, 90d supply, fill #1

## 2021-12-13 MED ORDER — LISINOPRIL 20 MG PO TABS: 20.0000 mg | ORAL_TABLET | Freq: Every day | ORAL | 0 refills | Status: DC

## 2021-12-13 MED ORDER — OMEPRAZOLE 40 MG PO CPDR: 40.0000 mg | DELAYED_RELEASE_CAPSULE | Freq: Every day | ORAL | 0 refills | Status: DC

## 2021-12-13 MED ORDER — SILDENAFIL CITRATE 100 MG PO TABS: 100.0000 mg | ORAL_TABLET | Freq: Every day | ORAL | 0 refills | Status: DC | PRN

## 2021-12-16 ENCOUNTER — Other Ambulatory Visit (HOSPITAL_COMMUNITY): Payer: Self-pay

## 2021-12-16 MED ORDER — DAPAGLIFLOZIN PROPANEDIOL 10 MG PO TABS
10.0000 mg | ORAL_TABLET | Freq: Every day | ORAL | 0 refills | Status: DC
  Filled 2022-03-26: qty 90, 90d supply, fill #0

## 2021-12-16 MED ORDER — LUBIPROSTONE 24 MCG PO CAPS
48.0000 ug | ORAL_CAPSULE | Freq: Every evening | ORAL | 0 refills | Status: DC
  Filled 2021-12-24: qty 180, 90d supply, fill #0

## 2021-12-16 MED ORDER — GLUCOSE BLOOD VI STRP
ORAL_STRIP | 11 refills | Status: AC
  Filled 2022-01-24: qty 150, 38d supply, fill #0
  Filled 2022-03-25: qty 150, 38d supply, fill #1
  Filled 2022-04-19: qty 150, 38d supply, fill #2
  Filled 2022-06-09: qty 150, 38d supply, fill #3
  Filled 2022-07-24: qty 150, 38d supply, fill #4
  Filled 2022-09-09: qty 150, 38d supply, fill #5
  Filled 2022-10-16: qty 150, 38d supply, fill #6
  Filled 2022-12-02: qty 150, 38d supply, fill #7

## 2021-12-16 MED ORDER — CARVEDILOL 12.5 MG PO TABS
12.5000 mg | ORAL_TABLET | Freq: Two times a day (BID) | ORAL | 2 refills | Status: DC
  Filled 2022-01-22: qty 180, 90d supply, fill #0

## 2021-12-16 MED ORDER — CHLORTHALIDONE 25 MG PO TABS
25.0000 mg | ORAL_TABLET | Freq: Every day | ORAL | 3 refills | Status: DC
  Filled 2022-04-26: qty 90, 90d supply, fill #0
  Filled 2022-07-28: qty 90, 90d supply, fill #1

## 2021-12-16 MED ORDER — LISINOPRIL 20 MG PO TABS
20.0000 mg | ORAL_TABLET | Freq: Every day | ORAL | 1 refills | Status: DC
  Filled 2022-04-04: qty 90, 90d supply, fill #0
  Filled 2022-07-24: qty 90, 90d supply, fill #1

## 2021-12-16 MED ORDER — FREESTYLE LIBRE 2 SENSOR MISC
4 refills | Status: DC
  Filled 2022-02-19: qty 6, 84d supply, fill #0

## 2021-12-16 MED ORDER — LINACLOTIDE 145 MCG PO CAPS
145.0000 ug | ORAL_CAPSULE | Freq: Every day | ORAL | 3 refills | Status: DC
  Filled 2022-03-20: qty 90, 90d supply, fill #0

## 2021-12-16 MED ORDER — OMEPRAZOLE 40 MG PO CPDR
40.0000 mg | DELAYED_RELEASE_CAPSULE | Freq: Every day | ORAL | 0 refills | Status: DC
  Filled 2022-02-19: qty 90, 90d supply, fill #0

## 2021-12-16 MED ORDER — CARVEDILOL 12.5 MG PO TABS: 12.5000 mg | ORAL_TABLET | Freq: Two times a day (BID) | ORAL | 0 refills | Status: DC

## 2021-12-16 MED ORDER — CARVEDILOL 12.5 MG PO TABS
12.5000 mg | ORAL_TABLET | Freq: Two times a day (BID) | ORAL | 1 refills | Status: DC
  Filled 2022-05-05: qty 180, 90d supply, fill #0

## 2021-12-16 MED ORDER — METHOCARBAMOL 750 MG PO TABS
750.0000 mg | ORAL_TABLET | Freq: Two times a day (BID) | ORAL | 4 refills | Status: DC | PRN
  Filled 2022-01-22: qty 60, 30d supply, fill #0
  Filled 2022-03-19: qty 60, 30d supply, fill #1

## 2021-12-17 ENCOUNTER — Other Ambulatory Visit (HOSPITAL_COMMUNITY): Payer: Self-pay

## 2021-12-20 ENCOUNTER — Other Ambulatory Visit (HOSPITAL_COMMUNITY): Payer: Self-pay

## 2021-12-20 MED ORDER — CYCLOBENZAPRINE HCL 10 MG PO TABS
10.0000 mg | ORAL_TABLET | Freq: Two times a day (BID) | ORAL | 0 refills | Status: DC
  Filled 2021-12-20: qty 180, 90d supply, fill #0

## 2021-12-21 ENCOUNTER — Other Ambulatory Visit (HOSPITAL_COMMUNITY): Payer: Self-pay

## 2021-12-21 MED ORDER — PREGABALIN 150 MG PO CAPS
150.0000 mg | ORAL_CAPSULE | Freq: Two times a day (BID) | ORAL | 0 refills | Status: DC
  Filled 2021-12-24 – 2021-12-27 (×2): qty 60, 30d supply, fill #0

## 2021-12-21 MED ORDER — OXYCODONE HCL 20 MG PO TABS
20.0000 mg | ORAL_TABLET | Freq: Four times a day (QID) | ORAL | 0 refills | Status: DC | PRN
  Filled 2021-12-23: qty 120, 30d supply, fill #0

## 2021-12-21 MED ORDER — BELBUCA 900 MCG BU FILM
900.0000 ug | ORAL_FILM | Freq: Two times a day (BID) | BUCCAL | 0 refills | Status: DC
  Filled 2021-12-23: qty 60, 30d supply, fill #0

## 2021-12-23 ENCOUNTER — Other Ambulatory Visit (HOSPITAL_COMMUNITY): Payer: Self-pay

## 2021-12-24 ENCOUNTER — Other Ambulatory Visit (HOSPITAL_COMMUNITY): Payer: Self-pay

## 2021-12-25 ENCOUNTER — Telehealth: Payer: Self-pay | Admitting: Interventional Cardiology

## 2021-12-25 ENCOUNTER — Other Ambulatory Visit (HOSPITAL_COMMUNITY): Payer: Self-pay

## 2021-12-25 MED ORDER — FARXIGA 10 MG PO TABS
10.0000 mg | ORAL_TABLET | Freq: Every day | ORAL | 0 refills | Status: DC
  Filled 2021-12-25 – 2022-09-25 (×3): qty 90, 90d supply, fill #0

## 2021-12-25 NOTE — Telephone Encounter (Signed)
Patient's wife is requesting to speak with Dr. Thompson Caul nurse regarding medications prescribed by another doctor. Wife declined going into detail with me. She states she has previously discussed this with Dr. Thompson Caul nurse and she is already aware of this matter.

## 2021-12-26 NOTE — Telephone Encounter (Signed)
Returned call to patient's wife [name].  [name] states she is concerned about patient because he is experiencing the same symptoms he had before when his potassium was low: puffy eyes, stumbling and falling, and feeling "heavy headed." [name] states these symptoms started shortly after PCP stopped potassium.  [name] states patient saw PCP Dr. Posey Pronto yesterday and she would not refill his potassium 50mq QD or Diltiazem '180mg'$  QD.  RShirlean Mylarreports patient had labs drawn yesterday, will request labs be sent to our office for Dr. STamala Julianto review.  RShirlean Mylarstates she is looking for new PCP for patient.  RShirlean Mylarwould like to know if Dr. STamala Julianwould refill patient's diltiazem and potassium.  Will forward to Dr. STamala Julianto review and advise.

## 2021-12-27 ENCOUNTER — Other Ambulatory Visit (HOSPITAL_COMMUNITY): Payer: Self-pay

## 2021-12-30 ENCOUNTER — Other Ambulatory Visit (HOSPITAL_COMMUNITY): Payer: Self-pay

## 2021-12-30 ENCOUNTER — Other Ambulatory Visit: Payer: Self-pay | Admitting: Interventional Cardiology

## 2022-01-06 ENCOUNTER — Other Ambulatory Visit: Payer: Self-pay | Admitting: Orthopedic Surgery

## 2022-01-06 DIAGNOSIS — T84032A Mechanical loosening of internal right knee prosthetic joint, initial encounter: Secondary | ICD-10-CM

## 2022-01-06 MED ORDER — DILTIAZEM HCL ER 180 MG PO CP24
180.0000 mg | ORAL_CAPSULE | Freq: Every day | ORAL | 3 refills | Status: DC
  Filled 2022-01-10: qty 90, 90d supply, fill #0
  Filled 2022-03-26: qty 90, 90d supply, fill #1
  Filled 2022-07-07: qty 90, 90d supply, fill #2
  Filled 2022-10-08: qty 90, 90d supply, fill #3

## 2022-01-08 ENCOUNTER — Other Ambulatory Visit (HOSPITAL_COMMUNITY): Payer: Self-pay

## 2022-01-08 NOTE — Telephone Encounter (Signed)
Left message for patient's spouse [name]. [name] reviewed labs from PCP. Potassium 3.6 on 12/25/21. [name] recommends patient supplement with fruit (bananas and oranges).  Provided office number for callback if any questions.

## 2022-01-09 ENCOUNTER — Other Ambulatory Visit (HOSPITAL_COMMUNITY): Payer: Self-pay

## 2022-01-10 ENCOUNTER — Other Ambulatory Visit (HOSPITAL_COMMUNITY): Payer: Self-pay

## 2022-01-10 ENCOUNTER — Ambulatory Visit: Payer: Medicare Other | Admitting: Adult Health

## 2022-01-10 MED ORDER — GLIPIZIDE ER 10 MG PO TB24
ORAL_TABLET | ORAL | 0 refills | Status: DC
  Filled 2022-01-10: qty 90, 45d supply, fill #0

## 2022-01-10 MED ORDER — LINACLOTIDE 145 MCG PO CAPS
145.0000 ug | ORAL_CAPSULE | Freq: Every day | ORAL | 3 refills | Status: DC
  Filled 2022-01-10 – 2022-03-20 (×2): qty 90, 90d supply, fill #0

## 2022-01-13 ENCOUNTER — Encounter (HOSPITAL_COMMUNITY)
Admission: RE | Admit: 2022-01-13 | Discharge: 2022-01-13 | Disposition: A | Discharge status: Home / Self Care | Payer: Medicare Other | Source: Ambulatory Visit | Attending: Orthopedic Surgery | Admitting: Orthopedic Surgery

## 2022-01-13 ENCOUNTER — Other Ambulatory Visit (HOSPITAL_COMMUNITY): Payer: Self-pay

## 2022-01-13 DIAGNOSIS — T84032A Mechanical loosening of internal right knee prosthetic joint, initial encounter: Secondary | ICD-10-CM | POA: Insufficient documentation

## 2022-01-13 MED ORDER — TECHNETIUM TC 99M MEDRONATE IV KIT
20.0000 | PACK | Freq: Once | INTRAVENOUS | Status: AC | PRN
  Administered 2022-01-13: 19.9 via INTRAVENOUS

## 2022-01-17 ENCOUNTER — Other Ambulatory Visit (HOSPITAL_COMMUNITY): Payer: Self-pay

## 2022-01-21 ENCOUNTER — Other Ambulatory Visit (HOSPITAL_COMMUNITY): Payer: Self-pay

## 2022-01-21 MED ORDER — BELBUCA 900 MCG BU FILM
1.0000 | ORAL_FILM | Freq: Two times a day (BID) | BUCCAL | 0 refills | Status: DC
  Filled 2022-01-21 – 2022-01-22 (×2): qty 60, 30d supply, fill #0

## 2022-01-21 MED ORDER — PREGABALIN 150 MG PO CAPS
150.0000 mg | ORAL_CAPSULE | Freq: Two times a day (BID) | ORAL | 0 refills | Status: DC
  Filled 2022-01-21 – 2022-01-29 (×3): qty 60, 30d supply, fill #0

## 2022-01-21 MED ORDER — OXYCODONE HCL 20 MG PO TABS
20.0000 mg | ORAL_TABLET | Freq: Four times a day (QID) | ORAL | 0 refills | Status: DC | PRN
  Filled 2022-01-21 – 2022-01-22 (×2): qty 120, 30d supply, fill #0

## 2022-01-21 MED ORDER — OXYCODONE HCL 5 MG PO TABS
5.0000 mg | ORAL_TABLET | Freq: Four times a day (QID) | ORAL | 0 refills | Status: DC | PRN
  Filled 2022-01-21: qty 20, 5d supply, fill #0

## 2022-01-22 ENCOUNTER — Other Ambulatory Visit (HOSPITAL_COMMUNITY): Payer: Self-pay

## 2022-01-24 ENCOUNTER — Other Ambulatory Visit (HOSPITAL_COMMUNITY): Payer: Self-pay

## 2022-01-27 ENCOUNTER — Other Ambulatory Visit (HOSPITAL_COMMUNITY): Payer: Self-pay

## 2022-01-28 ENCOUNTER — Other Ambulatory Visit (HOSPITAL_COMMUNITY): Payer: Self-pay

## 2022-01-29 ENCOUNTER — Other Ambulatory Visit (HOSPITAL_COMMUNITY): Payer: Self-pay

## 2022-02-06 ENCOUNTER — Other Ambulatory Visit (HOSPITAL_COMMUNITY): Payer: Self-pay

## 2022-02-08 ENCOUNTER — Other Ambulatory Visit (HOSPITAL_COMMUNITY): Payer: Self-pay

## 2022-02-10 ENCOUNTER — Other Ambulatory Visit (HOSPITAL_COMMUNITY): Payer: Self-pay

## 2022-02-11 ENCOUNTER — Other Ambulatory Visit (HOSPITAL_COMMUNITY): Payer: Self-pay

## 2022-02-11 MED ORDER — OXYCODONE HCL 5 MG PO TABS
ORAL_TABLET | ORAL | 0 refills | Status: DC
  Filled 2022-02-11 (×2): qty 20, 5d supply, fill #0

## 2022-02-18 ENCOUNTER — Other Ambulatory Visit (HOSPITAL_COMMUNITY): Payer: Self-pay

## 2022-02-18 MED ORDER — PREGABALIN 150 MG PO CAPS
150.0000 mg | ORAL_CAPSULE | Freq: Two times a day (BID) | ORAL | 0 refills | Status: DC
  Filled 2022-02-18: qty 60, 30d supply, fill #0
  Filled 2022-02-21: qty 60, fill #0
  Filled 2022-03-26 – 2022-04-07 (×2): qty 60, 30d supply, fill #0

## 2022-02-18 MED ORDER — BELBUCA 900 MCG BU FILM
ORAL_FILM | BUCCAL | 0 refills | Status: DC
  Filled 2022-02-18: qty 60, 30d supply, fill #0

## 2022-02-18 MED ORDER — OXYCODONE HCL 20 MG PO TABS
ORAL_TABLET | ORAL | 0 refills | Status: DC
  Filled 2022-02-18: qty 120, 30d supply, fill #0

## 2022-02-19 ENCOUNTER — Other Ambulatory Visit (HOSPITAL_COMMUNITY): Payer: Self-pay

## 2022-02-20 ENCOUNTER — Telehealth: Payer: Self-pay | Admitting: *Deleted

## 2022-02-20 NOTE — Telephone Encounter (Signed)
   Pre-operative Risk Assessment    Patient Name: Thomas Mcclure  DOB: February 06, 1956 MRN: [id number]      Request for Surgical Clearance    Procedure:   REVISION RT TOTAL KNEE ARTHROPLASTY  Date of Surgery:  Clearance TBD                                 Surgeon:  Charlies Constable, MD Surgeon's Group or Practice Name:  [facility] Phone number:  [phone] Fax number:  [phone]   Type of Clearance Requested:   - Medical  - Pharmacy:  Hold Aspirin NOT INDICATED   Type of Anesthesia:  Spinal   Additional requests/questions:    Astrid Divine   02/20/2022, 12:32 PM

## 2022-02-20 NOTE — Telephone Encounter (Signed)
   Name: Thomas Mcclure  DOB: 12-16-1955  MRN: [id number]  Primary Cardiologist: Sinclair Grooms, MD   Preoperative team, please contact this patient and set up a phone call appointment for further preoperative risk assessment. Please obtain consent and complete medication review. Thank you for your help.  I confirm that guidance regarding antiplatelet and oral anticoagulation therapy has been completed and, if necessary, noted below.  Per office protocol, if patient is without any new symptoms or concerns at the time of their virtual visit, he may hold Aspirin for 5-7 days prior to procedure. Please resume Aspirin as soon as possible postprocedure, at the discretion of the surgeon.    Lenna Sciara, NP 02/20/2022, 1:38 PM [place]

## 2022-02-20 NOTE — Telephone Encounter (Signed)
Returned call to pt / pt's wife, left another message for them to call back.

## 2022-02-20 NOTE — Telephone Encounter (Signed)
1st attempt to reach pt regarding surgical clearance and the need for a tele visit.  Left message for pt to call back and ask for the preop team.

## 2022-02-20 NOTE — Telephone Encounter (Signed)
Pt is returning call. Requesting return call.  

## 2022-02-21 ENCOUNTER — Other Ambulatory Visit (HOSPITAL_COMMUNITY): Payer: Self-pay

## 2022-02-25 ENCOUNTER — Telehealth: Payer: Self-pay | Admitting: *Deleted

## 2022-02-25 ENCOUNTER — Other Ambulatory Visit (HOSPITAL_COMMUNITY): Payer: Self-pay

## 2022-02-25 MED ORDER — OXYCODONE HCL 5 MG PO TABS
5.0000 mg | ORAL_TABLET | Freq: Four times a day (QID) | ORAL | 0 refills | Status: DC | PRN
  Filled 2022-02-25: qty 20, 5d supply, fill #0

## 2022-02-25 NOTE — Telephone Encounter (Signed)
[name] is returning call. Requesting call back.

## 2022-02-25 NOTE — Telephone Encounter (Signed)
S/w the pt's wife who scheduled a tele pre op appt for the pt 02/27/22 @ 2:40. Med rec and consent are done.Marland Kitchen

## 2022-02-25 NOTE — Telephone Encounter (Signed)
S/w the pt's wife who scheduled a tele pre op appt for the pt 02/27/22 @ 2:40. Med rec and consent are done..    Patient Consent for Virtual Visit        Thomas Mcclure has provided verbal consent on 02/25/2022 for a virtual visit (video or telephone).   CONSENT FOR VIRTUAL VISIT FOR:  Thomas Mcclure  By participating in this virtual visit I agree to the following:  I hereby voluntarily request, consent and authorize [facility] and its employed or contracted physicians, physician assistants, nurse practitioners or other licensed health care professionals (the Practitioner), to provide me with telemedicine health care services (the "Services") as deemed necessary by the treating Practitioner. I acknowledge and consent to receive the Services by the Practitioner via telemedicine. I understand that the telemedicine visit will involve communicating with the Practitioner through live audiovisual communication technology and the disclosure of certain medical information by electronic transmission. I acknowledge that I have been given the opportunity to request an in-person assessment or other available alternative prior to the telemedicine visit and am voluntarily participating in the telemedicine visit.  I understand that I have the right to withhold or withdraw my consent to the use of telemedicine in the course of my care at any time, without affecting my right to future care or treatment, and that the Practitioner or I may terminate the telemedicine visit at any time. I understand that I have the right to inspect all information obtained and/or recorded in the course of the telemedicine visit and may receive copies of available information for a reasonable fee.  I understand that some of the potential risks of receiving the Services via telemedicine include:  Delay or interruption in medical evaluation due to technological equipment failure or disruption; Information transmitted may not be  sufficient (e.g. poor resolution of images) to allow for appropriate medical decision making by the Practitioner; and/or  In rare instances, security protocols could fail, causing a breach of personal health information.  Furthermore, I acknowledge that it is my responsibility to provide information about my medical history, conditions and care that is complete and accurate to the best of my ability. I acknowledge that Practitioner's advice, recommendations, and/or decision may be based on factors not within their control, such as incomplete or inaccurate data provided by me or distortions of diagnostic images or specimens that may result from electronic transmissions. I understand that the practice of medicine is not an exact science and that Practitioner makes no warranties or guarantees regarding treatment outcomes. I acknowledge that a copy of this consent can be made available to me via my patient portal ([place]), or I can request a printed copy by calling the office of [facility].    I understand that my insurance will be billed for this visit.   I have read or had this consent read to me. I understand the contents of this consent, which adequately explains the benefits and risks of the Services being provided via telemedicine.  I have been provided ample opportunity to ask questions regarding this consent and the Services and have had my questions answered to my satisfaction. I give my informed consent for the services to be provided through the use of telemedicine in my medical care

## 2022-02-27 ENCOUNTER — Ambulatory Visit: Payer: Medicare Other | Attending: Cardiology | Admitting: Nurse Practitioner

## 2022-02-27 ENCOUNTER — Encounter: Payer: Self-pay | Admitting: Nurse Practitioner

## 2022-02-27 DIAGNOSIS — Z01810 Encounter for preprocedural cardiovascular examination: Secondary | ICD-10-CM

## 2022-02-27 NOTE — Progress Notes (Signed)
Virtual Visit via Telephone Note   Because of Thomas Mcclure's co-morbid illnesses, he is at least at moderate risk for complications without adequate follow up.  This format is felt to be most appropriate for this patient at this time.  The patient did not have access to video technology/had technical difficulties with video requiring transitioning to audio format only (telephone).  All issues noted in this document were discussed and addressed.  No physical exam could be performed with this format.  Please refer to the patient's chart for his consent to telehealth for [facility].  Evaluation Performed:  Preoperative cardiovascular risk assessment _____________   Date:  02/27/2022   Patient ID:  Thomas Mcclure, Thomas Mcclure 10-18-1955, MRN [id number] Patient Location:  Home Provider location:   Office  Primary Care Provider:  Selena Batten, MD Primary Cardiologist:  Sinclair Grooms, MD  Chief Complaint / Patient Profile   67 y.o. y/o male with a h/o CAD s/p NSTEMI 2017, chronic combined CHF, DM type II, HTN, HLD who is pending right total knee revision and presents today for telephonic preoperative cardiovascular risk assessment.  History of Present Illness    Thomas Mcclure is a 67 y.o. male who presents via audio/video conferencing for a telehealth visit today.  Pt was last seen in cardiology clinic on 09/27/2021 by [name] with no recurrence of chest discomfort.  At that time Thomas Mcclure was doing well.  The patient is now pending procedure as outlined above. Since his last visit, he reports that he is doing well without any new cardiac complaints.  He is very excited and anxious to get his knee revised and reports that he is still very active but limited with his knee pain.    Past Medical History    Past Medical History:  Diagnosis Date   Baker's cyst    Left calf   CAD in native artery, 07/15/15 PCI of RCA with DES 07/16/2015   a.   NSTEMI 5/17: LHC - pLAD 20,  pLCx 20, OM1 30, pRCA 100, EF 45-50%>> PCI: 2.5 x 24 mm Promus DES to RCA  //  b.   Echo 5/17: mild LVH, EF 50-55%, no RWMA, mod RVE //  c. LHC 6/17: pLAD 20, pLCx 20, OM1 50, pRCA stent ok, EF 35-45% with mod sized inf wall and basal segment aneurysm   Chronic back pain    "down my back, down my legs" (02/18/2017)   Chronic combined systolic and diastolic HF (heart failure) (HCC)    Constipation    Constipation due to opioid therapy    GERD (gastroesophageal reflux disease)    04/07/2019- not current   Hyperlipidemia    Hypertension    Dr. [name] [phone]   Ischemic cardiomyopathy    a. LV-gram at time of LHC in 6/17 with EF 35-45%  //  b. 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"   Numbness and tingling of both lower extremities    "on the outside of both sides" (02/18/2017)   Rheumatoid arthritis (HCC)    RA   Tachycardia    Type II diabetes mellitus ([place])    diet controlled   Past Surgical History:  Procedure Laterality Date   BACK SURGERY     x3   bilateral cataract curgery  CARDIAC CATHETERIZATION N/A 07/15/2015   Procedure: Left Heart Cath and Coronary Angiography;  Surgeon: [name], MD;  Location: [place] [facility];  Service: Cardiovascular;  Laterality: N/A;   CARDIAC CATHETERIZATION N/A 07/15/2015   Procedure: Coronary Stent Intervention;  Surgeon: [name], MD;  Location: [place] [facility];  Service: Cardiovascular;  Laterality: N/A;   CARDIAC CATHETERIZATION N/A 08/07/2015   Procedure: Left Heart Cath and Coronary Angiography;  Surgeon: [name], MD;  Location: [place] [facility];  Service: Cardiovascular;  Laterality: N/A;   CORONARY ANGIOPLASTY WITH STENT PLACEMENT  07/2015   LEFT HEART CATH AND CORONARY ANGIOGRAPHY N/A 02/19/2017   Procedure: LEFT HEART CATH AND  CORONARY ANGIOGRAPHY;  Surgeon: Martinique, Peter M, MD;  Location: [place] [facility];  Service: Cardiovascular;  Laterality: N/A;   LUMBAR FUSION  04/11/2019   REVISION OF THORACOLUMBAR FUSION    LUMBAR SPINE SURGERY  03/2009  & 2012   MASS EXCISION N/A 04/19/2012   Procedure: removal of posterior cervical lipoma;  Surgeon: [name], MD;  Location: [place] [facility];  Service: Neurosurgery;  Laterality: N/A;  Removal of posterior cervical lipoma   POPLITEAL SYNOVIAL CYST EXCISION Left    "opened up behind my knee; scraped out arthritis"   POSTERIOR LUMBAR FUSION 4 LEVEL N/A 11/22/2018   Procedure: Posterior Lateral and Interbody fusion - Lumbar one-Lumbar two; explore fusion, posterior instrumentation and fusion Thoracic ten to the ilium;  Surgeon: Newman Pies, MD;  Location: [place];  Service: Neurosurgery;  Laterality: N/A;   SPINAL CORD STIMULATOR INSERTION N/A 01/07/2013   Procedure:  SPINAL CORD STIMULATOR INSERTION;  Surgeon: [name], MD;  Location: [place] [facility];  Service: Neurosurgery;  Laterality: N/A;   SPINAL CORD STIMULATOR INSERTION N/A 02/09/2019   Procedure: REPLACEMENT OF LUMBAR SPINAL CORD STIMULATOR;  Surgeon: Newman Pies, MD;  Location: [place];  Service: Neurosurgery;  Laterality: N/A;  Thoracic/Lumbar spine   TONSILLECTOMY     patient denies   TOTAL HIP ARTHROPLASTY Right 10/03/2016   Procedure: TOTAL HIP ARTHROPLASTY;  Surgeon: [name], MD;  Location: [place];  Service: Orthopedics;  Laterality: Right;   TOTAL KNEE ARTHROPLASTY Right 11/09/2020   Procedure: TOTAL KNEE ARTHROPLASTY;  Surgeon: [name], MD;  Location: [facility];  Service: Orthopedics;  Laterality: Right;   WISDOM TOOTH EXTRACTION     hx of    Allergies  Allergies  Allergen Reactions   Brilinta [Ticagrelor] Shortness Of Breath and Other (See Comments)    CHEST PAIN    Methylprednisolone Other (See Comments)    DIAPHORESIS, HYPOTENSION   Prednisone Other (See Comments)     DIAPHORESIS, HYPOTENSION   Toradol [Ketorolac Tromethamine] Other (See Comments)    DIAPHORESIS, HYPOTENSION     Adhesive [Tape] Rash and Other (See Comments)    "blisters" ( NO SURGICAL TAPE, PLEASE)    Home Medications    Prior to Admission medications   Medication Sig Start Date End Date Taking? Authorizing Provider  ACCU-CHEK AVIVA PLUS test strip  04/30/19   [provider]  Accu-Chek Softclix Lancets lancets 4 (four) times daily. 07/12/20   [provider]  acetaminophen (TYLENOL) 500 MG tablet Take 1,000 mg by mouth 2 (two) times daily as needed (pain.).    [provider]  aspirin 81 MG chewable tablet Chew 1 tablet (81 mg total) by mouth daily. 09/16/21   Linus Galas, MD  atorvastatin (LIPITOR) 20 MG tablet Take 20 mg by mouth daily.  Patient not taking: Reported on 02/25/2022    [provider]  atorvastatin (LIPITOR) 20 MG tablet Take 1 tablet (20 mg total) by mouth at bedtime. Patient not taking: Reported on 02/25/2022 10/22/21     atorvastatin (LIPITOR) 20 MG tablet Take 1 tablet (20 mg total) by mouth at bedtime. 11/04/21     Buprenorphine HCl (BELBUCA) 900 MCG FILM Place 1 film inside cheeks 2 times daily. Patient not taking: Reported on 02/25/2022 08/22/21     Buprenorphine HCl (BELBUCA) 900 MCG FILM Dissolve 1 film inside cheeks 2 times daily 02/18/22     carvedilol (COREG) 12.5 MG tablet Take 1 tablet (12.5 mg total) by mouth 2 (two) times daily with a meal. Patient not taking: Reported on 02/25/2022 10/03/21   [name], MD  carvedilol (COREG) 12.5 MG tablet Take 1 tablet (12.5 mg total) by mouth 2 (two) times daily with a meal. Patient not taking: Reported on 02/25/2022 03/08/21     carvedilol (COREG) 12.5 MG tablet Take 1 tablet (12.5 mg total) by mouth 2 (two) times daily before a meal. Patient not taking: Reported on 02/25/2022 08/20/21     carvedilol (COREG) 12.5 MG tablet Take 1 tablet (12.5 mg total) by mouth 2 (two) times daily  with a meal. 06/12/21     chlorhexidine (PERIDEX) 0.12 % solution 15 mLs by Mouth Rinse route 2 (two) times daily. Patient not taking: Reported on 02/25/2022 08/29/21   [provider]  chlorthalidone (HYGROTON) 25 MG tablet Take 1 tablet (25 mg total) by mouth daily. Patient not taking: Reported on 02/25/2022 12/11/21   [name], MD  chlorthalidone (HYGROTON) 25 MG tablet Take 1 tablet (25 mg total) by mouth daily. Patient not taking: Reported on 02/25/2022 09/14/21     chlorthalidone (HYGROTON) 25 MG tablet Take 1 tablet (25 mg total) by mouth daily. 10/03/21     Continuous Blood Gluc Sensor (FREESTYLE LIBRE 2 SENSOR) MISC Use to test blood sugar, change every other week 10/03/21     cyclobenzaprine (FLEXERIL) 10 MG tablet Take 1 tablet (10 mg total) by mouth 2 (two) times daily. 12/20/21     dapagliflozin propanediol (FARXIGA) 10 MG TABS tablet Take 1 tablet (10 mg total) by mouth daily. Patient not taking: Reported on 02/25/2022 10/03/21     dapagliflozin propanediol (FARXIGA) 10 MG TABS tablet Take 1 tablet (10 mg total) by mouth daily. 12/25/21     diclofenac Sodium (VOLTAREN) 1 % GEL Apply 1 Application topically 2 (two) times daily as needed for pain. Patient not taking: Reported on 02/25/2022 06/06/21   [provider]  diclofenac Sodium (VOLTAREN) 1 % GEL Apply small amount to the affected area 3 times a day as needed for pain 08/02/21     diltiazem (DILT-XR) 180 MG 24 hr capsule Take 1 capsule (180 mg total) by mouth daily. 01/06/22   [name], MD  docusate sodium (COLACE) 100 MG capsule Take 1 capsule (100 mg total) by mouth 2 (two) times daily. Patient taking differently: Take 300 mg by mouth 2 (two) times daily. 02/10/19   Viona Gilmore D, NP  DULoxetine (CYMBALTA) 60 MG capsule Take 1 capsule by mouth daily Patient not taking: Reported on 02/25/2022 08/19/21     DULoxetine (CYMBALTA) 60 MG capsule Take 1 capsule (60 mg total) by mouth daily. Patient not taking: Reported  on 02/25/2022 10/22/21     DULoxetine (CYMBALTA) 60 MG capsule Take 1 capsule (60 mg total) by mouth daily. 08/22/21  glipiZIDE (GLUCOTROL XL) 10 MG 24 hr tablet Take 10 mg by mouth daily with breakfast. Patient not taking: Reported on 02/25/2022    [provider]  glipiZIDE (GLUCOTROL XL) 10 MG 24 hr tablet Take 2 tablets (20 mg total) by mouth daily at breakfast. Patient not taking: Reported on 02/25/2022 10/22/21     glipiZIDE (GLUCOTROL XL) 10 MG 24 hr tablet Take 2 tablets by mouth daily at breakfast 10/03/21     glucose blood test strip Use to test 4 times a day as directed 12/09/21     linaclotide (LINZESS) 145 MCG CAPS capsule Take 1 capsule (145 mcg total) by mouth daily. Patient not taking: Reported on 02/25/2022 10/22/21     linaclotide (LINZESS) 145 MCG CAPS capsule Take 1 capsule (145 mcg total) by mouth daily. Patient not taking: Reported on 02/25/2022 11/04/21     linaclotide (LINZESS) 145 MCG CAPS capsule Take 1 capsule (145 mcg total) by mouth daily. Patient not taking: Reported on 02/25/2022 10/03/21     linaclotide (LINZESS) 145 MCG CAPS capsule Take 1 capsule (145 mcg total) by mouth daily. 01/07/22     LINZESS 145 MCG CAPS capsule Take 145 mcg by mouth as needed. Patient not taking: Reported on 02/25/2022 09/27/21   [provider]  lisinopril (ZESTRIL) 20 MG tablet Take 1 tablet (20 mg total) by mouth daily. Patient not taking: Reported on 02/25/2022 08/19/21     lisinopril (ZESTRIL) 20 MG tablet Take 1 tablet (20 mg total) by mouth daily. Patient not taking: Reported on 02/25/2022 10/22/21     lisinopril (ZESTRIL) 20 MG tablet Take 1 tablet (20 mg total) by mouth daily. Patient not taking: Reported on 02/25/2022 08/22/21     lisinopril (ZESTRIL) 20 MG tablet Take 1 tablet (20 mg total) by mouth daily. 10/03/21     lubiprostone (AMITIZA) 24 MCG capsule Take 2 capsules (48 mcg total) by mouth at bedtime. Patient not taking: Reported on 02/25/2022 08/19/21     lubiprostone (AMITIZA)  24 MCG capsule Take 2 (two) Capsules by mouth at bedtime Patient not taking: Reported on 02/25/2022 09/19/21     lubiprostone (AMITIZA) 24 MCG capsule Take 2 capsules (48 mcg total) by mouth at bedtime. Patient not taking: Reported on 02/25/2022 10/22/21     lubiprostone (AMITIZA) 24 MCG capsule Take 2 capsules (48 mcg total) by mouth at bedtime. Patient not taking: Reported on 02/25/2022 11/04/21     lubiprostone (AMITIZA) 24 MCG capsule Take 2 capsules by mouth at bedtime. 10/03/21     metFORMIN (GLUCOPHAGE) 1000 MG tablet Take 1,000 mg by mouth 2 (two) times daily with a meal. Patient not taking: Reported on 02/25/2022    [provider]  metFORMIN (GLUCOPHAGE) 1000 MG tablet Take 1 tablet (1,000 mg total) by mouth 2 (two) times daily. 10/22/21     methocarbamol (ROBAXIN) 750 MG tablet Take 1 tablet (750 mg total) by mouth every 6 (six) hours as needed for muscle spasms. Patient not taking: Reported on 02/25/2022 09/16/21   Linus Galas, MD  methocarbamol (ROBAXIN) 750 MG tablet Take 1 tablet by mouth 2 times daily as needed. Patient not taking: Reported on 02/25/2022 09/19/21     methocarbamol (ROBAXIN) 750 MG tablet Take 1 tablet (750 mg total) by mouth 2 (two) times daily as needed. 10/03/21     Multiple Vitamin (MULTIVITAMIN WITH MINERALS) TABS tablet Take 1 tablet by mouth daily.    [provider]  naloxone [facility]) nasal spray 4 mg/0.1 mL Place  1 spray into the nose as needed. If found unresposive then spray this into nose and call 911 immediately Patient not taking: Reported on 02/25/2022 11/16/21     omeprazole (PRILOSEC) 40 MG capsule Take 1 capsule (40 mg total) by mouth daily. Patient not taking: Reported on 02/25/2022 03/01/21     omeprazole (PRILOSEC) 40 MG capsule Take 1 capsule (40 mg total) by mouth daily. 10/03/21     oxyCODONE (OXY IR/ROXICODONE) 5 MG immediate release tablet Take 1 tablet (5 mg total) by mouth every 6 (six) hours as needed for pain. Patient not taking:  Reported on 02/25/2022 01/21/22     oxyCODONE (OXY IR/ROXICODONE) 5 MG immediate release tablet Take 1 tablet by mouth every 6 hours as needed for pain Patient not taking: Reported on 02/25/2022 02/11/22     oxyCODONE (OXY IR/ROXICODONE) 5 MG immediate release tablet Take 1 tablet (5 mg total) by mouth every 6 (six) hours as needed for pain. 02/25/22     Oxycodone HCl 20 MG TABS Take 1 tablet (20 mg total) by mouth every 6 (six) hours as needed. Patient not taking: Reported on 02/25/2022 06/27/21     Oxycodone HCl 20 MG TABS Take 1 (one) tablet by mouth every six hours as needed Patient not taking: Reported on 02/25/2022 09/19/21     Oxycodone HCl 20 MG TABS Take 1 tablet by mouth every 6 hours as needed Patient not taking: Reported on 02/25/2022 10/22/21     Oxycodone HCl 20 MG TABS Take 1 tablet (20 mg total) by mouth every 6 (six) hours as needed. Patient not taking: Reported on 02/25/2022 01/21/22     Oxycodone HCl 20 MG TABS Take 1 tablet by mouth every 6 hours, as needed 02/18/22     Potassium Chloride ER 20 MEQ TBCR Take 1 tablet by mouth daily Patient not taking: Reported on 10/03/2021 05/02/21     Potassium Chloride ER 20 MEQ TBCR Take 1 tablet by mouth daily. Patient not taking: Reported on 02/25/2022 10/22/21     potassium chloride SA (KLOR-CON M) 20 MEQ tablet Take 1 tablet (20 mEq total) by mouth daily. Patient not taking: Reported on 02/25/2022 09/26/21     predniSONE (STERAPRED UNI-PAK 48 TAB) 5 MG (48) TBPK tablet Take 10 mg by mouth 2 (two) times daily. Patient not taking: Reported on 02/25/2022 09/24/21   [provider]  pregabalin (LYRICA) 150 MG capsule Take 1 capsule by mouth two times daily Patient not taking: Reported on 02/25/2022 08/22/21     pregabalin (LYRICA) 150 MG capsule Take 1 capsule by mouth 2 times daily Patient not taking: Reported on 02/25/2022 09/19/21     pregabalin (LYRICA) 150 MG capsule Take 1 (one) Capsule by mouth two times daily Patient not taking: Reported on 02/25/2022  10/22/21     pregabalin (LYRICA) 150 MG capsule Take 1 capsule (150 mg total) by mouth 2 (two) times daily. Patient not taking: Reported on 02/25/2022 11/16/21     pregabalin (LYRICA) 150 MG capsule Take 1 capsule (150 mg total) by mouth 2 (two) times daily. Patient not taking: Reported on 02/25/2022 12/21/21     pregabalin (LYRICA) 150 MG capsule Take 1 capsule (150 mg total) by mouth 2 (two) times daily. Patient not taking: Reported on 02/25/2022 01/21/22     pregabalin (LYRICA) 150 MG capsule Take 1 capsule by mouth 2 times daily 02/18/22     sildenafil (VIAGRA) 100 MG tablet Take 100 mg by mouth as needed for erectile dysfunction.  Patient not taking: Reported on 02/25/2022    [provider]  sildenafil (VIAGRA) 100 MG tablet Take 1 tablet (100 mg total) by mouth daily as needed. 05/07/21       Physical Exam    Vital Signs:  JAYQUON THEILER does not have vital signs available for review today.  Given telephonic nature of communication, physical exam is limited. AAOx3. NAD. Normal affect.  Speech and respirations are unlabored.  Accessory Clinical Findings    None  Assessment & Plan    1.  Preoperative Cardiovascular Risk Assessment: { The patient affirms he has been doing well without any new cardiac symptoms. They are able to achieve 5 METS without cardiac limitations. Therefore, based on ACC/AHA guidelines, the patient would be at acceptable risk for the planned procedure without further cardiovascular testing. The patient was advised that if he develops new symptoms prior to surgery to contact our office to arrange for a follow-up visit, and he verbalized understanding.   Mr. Holley perioperative risk of a major cardiac event is 6.6% according to the Revised Cardiac Risk Index (RCRI).  Therefore, he is at low risk for perioperative complications.   His functional capacity is fair at 5.59 METs according to the [facility] Activity Status Index (DASI). Recommendations: According to  ACC/AHA guidelines, no further cardiovascular testing needed.  The patient may proceed to surgery at acceptable risk.   Antiplatelet and/or Anticoagulation Recommendations: Aspirin can be held for 5-7 days prior to his surgery.  Please resume Aspirin post operatively when it is felt to be safe from a bleeding standpoint.      A copy of this note will be routed to requesting surgeon.  Time:   Today, I have spent 8 minutes with the patient with telehealth technology discussing medical history, symptoms, and management plan.     Mable Fill, Marissa Nestle, NP  02/27/2022, 12:26 PM

## 2022-03-01 ENCOUNTER — Other Ambulatory Visit (HOSPITAL_COMMUNITY): Payer: Self-pay

## 2022-03-06 ENCOUNTER — Other Ambulatory Visit (HOSPITAL_COMMUNITY): Payer: Self-pay

## 2022-03-06 MED ORDER — OXYCODONE HCL 5 MG PO TABS
ORAL_TABLET | ORAL | 0 refills | Status: DC
  Filled 2022-03-06: qty 20, 5d supply, fill #0

## 2022-03-16 ENCOUNTER — Ambulatory Visit: Payer: Self-pay | Admitting: Physician Assistant

## 2022-03-16 DIAGNOSIS — G8929 Other chronic pain: Secondary | ICD-10-CM

## 2022-03-16 NOTE — H&P (Signed)
TOTAL KNEE REVISION ADMISSION H&P  Patient is being admitted for right revision total knee arthroplasty.  Subjective:  Chief Complaint:right knee pain.  HPI: Thomas Mcclure, 67 y.o. male, has a history of pain and functional disability in the right knee(s) due to failed previous arthroplasty and patient has failed non-surgical conservative treatments for greater than 12 weeks to include NSAID's and/or analgesics, flexibility and strengthening excercises, supervised PT with diminished ADL's post treatment, use of assistive devices, and activity modification. The indications for the revision of the total knee arthroplasty are loosening of one or more components. Onset of symptoms was gradual starting 2 years ago with gradually worsening course since that time.  Prior procedures on the right knee(s) include arthroplasty.  Patient currently rates pain in the right knee(s) at 10 out of 10 with activity. There is night pain, worsening of pain with activity and weight bearing, pain that interferes with activities of daily living, pain with passive range of motion, and joint swelling.  Patient has evidence of prosthetic loosening by imaging studies. This condition presents safety issues increasing the risk of falls.  There is no current active infection.  Patient Active Problem List   Diagnosis Date Noted   Atypical chest pain 09/15/2021   Primary localized osteoarthritis of right knee 11/09/2020   Chronic low back pain 02/09/2019   Spinal stenosis of lumbar region with neurogenic claudication 11/22/2018   Radiculopathy of lumbar region 05/31/2017   HCAP (healthcare-associated pneumonia) 05/30/2017   Leukocytosis 05/30/2017   Lumbar pseudoarthrosis 05/27/2017   Unstable angina ([place]) 02/18/2017   Primary localized osteoarthritis of right hip 10/03/2016   Chest pain, rule out acute myocardial infarction 05/01/2016   Diabetes mellitus type 2, controlled, with complications ([place]) 09/62/8366   Obesity  (BMI 30-39.9) 05/01/2016   Left ventricular aneurysm 12/03/2015   Ischemic cardiomyopathy    CAD (coronary artery disease) 07/16/2015   Hypokalemia 07/16/2015   History of non-ST elevation myocardial infarction (NSTEMI) 07/15/2015   Hypertension 07/15/2015   Hyperlipidemia with target LDL less than 100 07/15/2015   GERD (gastroesophageal reflux disease) 06/18/2012   Chronic radicular low back pain 06/18/2012   Past Medical History:  Diagnosis Date   Baker's cyst    Left calf   CAD in native artery, 07/15/15 PCI of RCA with DES 07/16/2015   a.   NSTEMI 5/17: LHC - pLAD 20, pLCx 20, OM1 30, pRCA 100, EF 45-50%>> PCI: 2.5 x 24 mm Promus DES to RCA  //  b.   Echo 5/17: mild LVH, EF 50-55%, no RWMA, mod RVE //  c. LHC 6/17: pLAD 20, pLCx 20, OM1 50, pRCA stent ok, EF 35-45% with mod sized inf wall and basal segment aneurysm   Chronic back pain    "down my back, down my legs" (02/18/2017)   Chronic combined systolic and diastolic HF (heart failure) (HCC)    Constipation    Constipation due to opioid therapy    GERD (gastroesophageal reflux disease)    04/07/2019- not current   Hyperlipidemia    Hypertension    Dr. [name] [phone]   Ischemic cardiomyopathy    a. LV-gram at time of LHC in 6/17 with EF 35-45%  //  b. 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"   Numbness and tingling of both lower extremities    "  on the outside of both sides" (02/18/2017)   Rheumatoid arthritis ([place])    RA   Tachycardia    Type II diabetes mellitus ([place])    diet controlled    Past Surgical History:  Procedure Laterality Date   BACK SURGERY     x3   bilateral cataract curgery      CARDIAC CATHETERIZATION N/A 07/15/2015   Procedure: Left Heart Cath and Coronary Angiography;  Surgeon: [name], MD;  Location: [facility];  Service: Cardiovascular;  Laterality: N/A;   CARDIAC CATHETERIZATION N/A 07/15/2015   Procedure: Coronary Stent Intervention;  Surgeon: [name], MD;  Location: [facility];  Service: Cardiovascular;  Laterality: N/A;   CARDIAC CATHETERIZATION N/A 08/07/2015   Procedure: Left Heart Cath and Coronary Angiography;  Surgeon: [name], MD;  Location: [facility];  Service: Cardiovascular;  Laterality: N/A;   CORONARY ANGIOPLASTY WITH STENT PLACEMENT  07/2015   LEFT HEART CATH AND CORONARY ANGIOGRAPHY N/A 02/19/2017   Procedure: LEFT HEART CATH AND CORONARY ANGIOGRAPHY;  Surgeon: Martinique, Peter M, MD;  Location: [facility];  Service: Cardiovascular;  Laterality: N/A;   LUMBAR FUSION  04/11/2019   REVISION OF THORACOLUMBAR FUSION    LUMBAR SPINE SURGERY  03/2009  & 2012   MASS EXCISION N/A 04/19/2012   Procedure: removal of posterior cervical lipoma;  Surgeon: [name], MD;  Location: [facility];  Service: Neurosurgery;  Laterality: N/A;  Removal of posterior cervical lipoma   POPLITEAL SYNOVIAL CYST EXCISION Left    "opened up behind my knee; scraped out arthritis"   POSTERIOR LUMBAR FUSION 4 LEVEL N/A 11/22/2018   Procedure: Posterior Lateral and Interbody fusion - Lumbar one-Lumbar two; explore fusion, posterior instrumentation and fusion Thoracic ten to the ilium;  Surgeon: Newman Pies, MD;  Location: [facility];  Service: Neurosurgery;  Laterality: N/A;   SPINAL CORD STIMULATOR INSERTION N/A 01/07/2013   Procedure:  SPINAL CORD STIMULATOR INSERTION;  Surgeon: [name], MD;  Location: [facility];  Service: Neurosurgery;  Laterality: N/A;   SPINAL CORD STIMULATOR INSERTION N/A 02/09/2019   Procedure: REPLACEMENT OF LUMBAR SPINAL CORD STIMULATOR;  Surgeon: Newman Pies, MD;  Location: [facility];  Service: Neurosurgery;  Laterality: N/A;  Thoracic/Lumbar spine   TONSILLECTOMY     patient denies   TOTAL HIP ARTHROPLASTY Right  10/03/2016   Procedure: TOTAL HIP ARTHROPLASTY;  Surgeon: [name], MD;  Location: [facility];  Service: Orthopedics;  Laterality: Right;   TOTAL KNEE ARTHROPLASTY Right 11/09/2020   Procedure: TOTAL KNEE ARTHROPLASTY;  Surgeon: [name], MD;  Location: [facility];  Service: Orthopedics;  Laterality: Right;   WISDOM TOOTH EXTRACTION     hx of    Current Outpatient Medications  Medication Sig Dispense Refill Last Dose   ACCU-CHEK AVIVA PLUS test strip       Accu-Chek Softclix Lancets lancets 4 (four) times daily.      acetaminophen (TYLENOL) 500 MG tablet Take 1,000 mg by mouth 2 (two) times daily as needed (pain.).      aspirin 81 MG chewable tablet Chew 1 tablet (81 mg total) by mouth daily. 30 tablet 2    atorvastatin (LIPITOR) 20 MG tablet Take 20 mg by mouth daily. (Patient not taking: Reported on 02/25/2022)      atorvastatin (LIPITOR) 20 MG tablet Take 1 tablet (20 mg total) by mouth at bedtime. (Patient not taking: Reported on 02/25/2022) 30 tablet 1  atorvastatin (LIPITOR) 20 MG tablet Take 1 tablet (20 mg total) by mouth at bedtime. 90 tablet 1    Buprenorphine HCl (BELBUCA) 900 MCG FILM Place 1 film inside cheeks 2 times daily. (Patient not taking: Reported on 02/25/2022) 60 each 0    Buprenorphine HCl (BELBUCA) 900 MCG FILM Dissolve 1 film inside cheeks 2 times daily 60 each 0    carvedilol (COREG) 12.5 MG tablet Take 1 tablet (12.5 mg total) by mouth 2 (two) times daily with a meal. (Patient not taking: Reported on 02/25/2022) 180 tablet 2    carvedilol (COREG) 12.5 MG tablet Take 1 tablet (12.5 mg total) by mouth 2 (two) times daily with a meal. (Patient not taking: Reported on 02/25/2022) 180 tablet 2    carvedilol (COREG) 12.5 MG tablet Take 1 tablet (12.5 mg total) by mouth 2 (two) times daily before a meal. (Patient not taking: Reported on 02/25/2022) 180 tablet 0    carvedilol (COREG) 12.5 MG tablet Take 1 tablet (12.5 mg total) by mouth 2 (two) times daily with a meal. 180 tablet 1     chlorhexidine (PERIDEX) 0.12 % solution 15 mLs by Mouth Rinse route 2 (two) times daily. (Patient not taking: Reported on 02/25/2022)      chlorthalidone (HYGROTON) 25 MG tablet Take 1 tablet (25 mg total) by mouth daily. (Patient not taking: Reported on 02/25/2022) 90 tablet 3    chlorthalidone (HYGROTON) 25 MG tablet Take 1 tablet (25 mg total) by mouth daily. (Patient not taking: Reported on 02/25/2022) 90 tablet 0    chlorthalidone (HYGROTON) 25 MG tablet Take 1 tablet (25 mg total) by mouth daily. 90 tablet 3    Continuous Blood Gluc Sensor (FREESTYLE LIBRE 2 SENSOR) MISC Use to test blood sugar, change every other week 6 each 4    cyclobenzaprine (FLEXERIL) 10 MG tablet Take 1 tablet (10 mg total) by mouth 2 (two) times daily. 180 tablet 0    dapagliflozin propanediol (FARXIGA) 10 MG TABS tablet Take 1 tablet (10 mg total) by mouth daily. (Patient not taking: Reported on 02/25/2022) 90 tablet 0    dapagliflozin propanediol (FARXIGA) 10 MG TABS tablet Take 1 tablet (10 mg total) by mouth daily. 90 tablet 0    diclofenac Sodium (VOLTAREN) 1 % GEL Apply 1 Application topically 2 (two) times daily as needed for pain. (Patient not taking: Reported on 02/25/2022)      diclofenac Sodium (VOLTAREN) 1 % GEL Apply small amount to the affected area 3 times a day as needed for pain 100 g 2    diltiazem (DILT-XR) 180 MG 24 hr capsule Take 1 capsule (180 mg total) by mouth daily. 90 capsule 3    docusate sodium (COLACE) 100 MG capsule Take 1 capsule (100 mg total) by mouth 2 (two) times daily. (Patient taking differently: Take 300 mg by mouth 2 (two) times daily.) 10 capsule 0    DULoxetine (CYMBALTA) 60 MG capsule Take 1 capsule by mouth daily (Patient not taking: Reported on 02/25/2022) 90 capsule 1    DULoxetine (CYMBALTA) 60 MG capsule Take 1 capsule (60 mg total) by mouth daily. (Patient not taking: Reported on 02/25/2022) 90 capsule 1    DULoxetine (CYMBALTA) 60 MG capsule Take 1 capsule (60 mg total) by mouth  daily. 90 capsule 1    glipiZIDE (GLUCOTROL XL) 10 MG 24 hr tablet Take 10 mg by mouth daily with breakfast. (Patient not taking: Reported on 02/25/2022)      glipiZIDE (GLUCOTROL XL) 10  MG 24 hr tablet Take 2 tablets (20 mg total) by mouth daily at breakfast. (Patient not taking: Reported on 02/25/2022) 90 tablet 1    glipiZIDE (GLUCOTROL XL) 10 MG 24 hr tablet Take 2 tablets by mouth daily at breakfast 90 tablet 0    glucose blood test strip Use to test 4 times a day as directed 150 each 11    linaclotide (LINZESS) 145 MCG CAPS capsule Take 1 capsule (145 mcg total) by mouth daily. (Patient not taking: Reported on 02/25/2022) 90 capsule 3    linaclotide (LINZESS) 145 MCG CAPS capsule Take 1 capsule (145 mcg total) by mouth daily. (Patient not taking: Reported on 02/25/2022) 90 capsule 3    linaclotide (LINZESS) 145 MCG CAPS capsule Take 1 capsule (145 mcg total) by mouth daily. (Patient not taking: Reported on 02/25/2022) 90 capsule 3    linaclotide (LINZESS) 145 MCG CAPS capsule Take 1 capsule (145 mcg total) by mouth daily. 90 capsule 3    LINZESS 145 MCG CAPS capsule Take 145 mcg by mouth as needed. (Patient not taking: Reported on 02/25/2022)      lisinopril (ZESTRIL) 20 MG tablet Take 1 tablet (20 mg total) by mouth daily. (Patient not taking: Reported on 02/25/2022) 90 tablet 1    lisinopril (ZESTRIL) 20 MG tablet Take 1 tablet (20 mg total) by mouth daily. (Patient not taking: Reported on 02/25/2022) 90 tablet 1    lisinopril (ZESTRIL) 20 MG tablet Take 1 tablet (20 mg total) by mouth daily. (Patient not taking: Reported on 02/25/2022) 90 tablet 0    lisinopril (ZESTRIL) 20 MG tablet Take 1 tablet (20 mg total) by mouth daily. 90 tablet 1    lubiprostone (AMITIZA) 24 MCG capsule Take 2 capsules (48 mcg total) by mouth at bedtime. (Patient not taking: Reported on 02/25/2022) 180 capsule 0    lubiprostone (AMITIZA) 24 MCG capsule Take 2 (two) Capsules by mouth at bedtime (Patient not taking: Reported on 02/25/2022)  180 capsule 0    lubiprostone (AMITIZA) 24 MCG capsule Take 2 capsules (48 mcg total) by mouth at bedtime. (Patient not taking: Reported on 02/25/2022) 180 capsule 0    lubiprostone (AMITIZA) 24 MCG capsule Take 2 capsules (48 mcg total) by mouth at bedtime. (Patient not taking: Reported on 02/25/2022) 180 capsule 1    lubiprostone (AMITIZA) 24 MCG capsule Take 2 capsules by mouth at bedtime. 180 capsule 0    metFORMIN (GLUCOPHAGE) 1000 MG tablet Take 1,000 mg by mouth 2 (two) times daily with a meal. (Patient not taking: Reported on 02/25/2022)      metFORMIN (GLUCOPHAGE) 1000 MG tablet Take 1 tablet (1,000 mg total) by mouth 2 (two) times daily. 180 tablet 1    methocarbamol (ROBAXIN) 750 MG tablet Take 1 tablet (750 mg total) by mouth every 6 (six) hours as needed for muscle spasms. (Patient not taking: Reported on 02/25/2022) 40 tablet 2    methocarbamol (ROBAXIN) 750 MG tablet Take 1 tablet by mouth 2 times daily as needed. (Patient not taking: Reported on 02/25/2022) 60 tablet 4    methocarbamol (ROBAXIN) 750 MG tablet Take 1 tablet (750 mg total) by mouth 2 (two) times daily as needed. 60 tablet 4    Multiple Vitamin (MULTIVITAMIN WITH MINERALS) TABS tablet Take 1 tablet by mouth daily.      naloxone (NARCAN) nasal spray 4 mg/0.1 mL Place 1 spray into the nose as needed. If found unresposive then spray this into nose and call 911 immediately (Patient not  taking: Reported on 02/25/2022) 2 each 3    omeprazole (PRILOSEC) 40 MG capsule Take 1 capsule (40 mg total) by mouth daily. (Patient not taking: Reported on 02/25/2022) 90 capsule 0    omeprazole (PRILOSEC) 40 MG capsule Take 1 capsule (40 mg total) by mouth daily. 90 capsule 0    oxyCODONE (OXY IR/ROXICODONE) 5 MG immediate release tablet Take 1 tablet (5 mg total) by mouth every 6 (six) hours as needed for pain. (Patient not taking: Reported on 02/25/2022) 20 tablet 0    oxyCODONE (OXY IR/ROXICODONE) 5 MG immediate release tablet Take 1 tablet by mouth every  6 hours as needed for pain (Patient not taking: Reported on 02/25/2022) 20 tablet 0    oxyCODONE (OXY IR/ROXICODONE) 5 MG immediate release tablet Take 1 tablet (5 mg total) by mouth every 6 (six) hours as needed for pain. 20 tablet 0    oxyCODONE (OXY IR/ROXICODONE) 5 MG immediate release tablet Take 1 tablet by mouth every 6 hours as needed for pain 20 tablet 0    Oxycodone HCl 20 MG TABS Take 1 tablet (20 mg total) by mouth every 6 (six) hours as needed. (Patient not taking: Reported on 02/25/2022) 120 tablet 0    Oxycodone HCl 20 MG TABS Take 1 (one) tablet by mouth every six hours as needed (Patient not taking: Reported on 02/25/2022) 120 tablet 0    Oxycodone HCl 20 MG TABS Take 1 tablet by mouth every 6 hours as needed (Patient not taking: Reported on 02/25/2022) 120 tablet 0    Oxycodone HCl 20 MG TABS Take 1 tablet (20 mg total) by mouth every 6 (six) hours as needed. (Patient not taking: Reported on 02/25/2022) 120 tablet 0    Oxycodone HCl 20 MG TABS Take 1 tablet by mouth every 6 hours, as needed 120 tablet 0    Potassium Chloride ER 20 MEQ TBCR Take 1 tablet by mouth daily (Patient not taking: Reported on 10/03/2021) 30 tablet 0    Potassium Chloride ER 20 MEQ TBCR Take 1 tablet by mouth daily. (Patient not taking: Reported on 02/25/2022) 90 tablet 0    potassium chloride SA (KLOR-CON M) 20 MEQ tablet Take 1 tablet (20 mEq total) by mouth daily. (Patient not taking: Reported on 02/25/2022) 90 tablet 0    predniSONE (STERAPRED UNI-PAK 48 TAB) 5 MG (48) TBPK tablet Take 10 mg by mouth 2 (two) times daily. (Patient not taking: Reported on 02/25/2022)      pregabalin (LYRICA) 150 MG capsule Take 1 capsule by mouth two times daily (Patient not taking: Reported on 02/25/2022) 60 capsule 0    pregabalin (LYRICA) 150 MG capsule Take 1 capsule by mouth 2 times daily (Patient not taking: Reported on 02/25/2022) 60 capsule 0    pregabalin (LYRICA) 150 MG capsule Take 1 (one) Capsule by mouth two times daily (Patient not  taking: Reported on 02/25/2022) 60 capsule 0    pregabalin (LYRICA) 150 MG capsule Take 1 capsule (150 mg total) by mouth 2 (two) times daily. (Patient not taking: Reported on 02/25/2022) 60 capsule 0    pregabalin (LYRICA) 150 MG capsule Take 1 capsule (150 mg total) by mouth 2 (two) times daily. (Patient not taking: Reported on 02/25/2022) 60 capsule 0    pregabalin (LYRICA) 150 MG capsule Take 1 capsule (150 mg total) by mouth 2 (two) times daily. (Patient not taking: Reported on 02/25/2022) 60 capsule 0    pregabalin (LYRICA) 150 MG capsule Take 1 capsule by mouth  2 times daily 60 capsule 0    sildenafil (VIAGRA) 100 MG tablet Take 100 mg by mouth as needed for erectile dysfunction. (Patient not taking: Reported on 02/25/2022)      sildenafil (VIAGRA) 100 MG tablet Take 1 tablet (100 mg total) by mouth daily as needed. 10 tablet 0    No current facility-administered medications for this visit.   Allergies  Allergen Reactions   Brilinta [Ticagrelor] Shortness Of Breath and Other (See Comments)    CHEST PAIN    Methylprednisolone Other (See Comments)    DIAPHORESIS, HYPOTENSION   Prednisone Other (See Comments)    DIAPHORESIS, HYPOTENSION   Toradol [Ketorolac Tromethamine] Other (See Comments)    DIAPHORESIS, HYPOTENSION     Adhesive [Tape] Rash and Other (See Comments)    "blisters" ( NO SURGICAL TAPE, PLEASE)    Social History   Tobacco Use   Smoking status: Former    Packs/day: 0.25    Years: 40.00    Total pack years: 10.00    Types: Cigarettes    Quit date: 2008    Years since quitting: 16.0   Smokeless tobacco: Never   Tobacco comments:    02/18/2017 "quit in 2012"  Substance Use Topics   Alcohol use: No    Alcohol/week: 0.0 standard drinks of alcohol    Family History  Problem Relation Age of Onset   Heart disease Mother    Prostate cancer Brother       Review of Systems  Cardiovascular:  Positive for chest pain, palpitations and leg swelling.  Gastrointestinal:   Positive for constipation.  Musculoskeletal:  Positive for arthralgias and joint swelling.  Neurological:  Positive for headaches.  All other systems reviewed and are negative.    Objective:  Physical Exam Constitutional:      General: He is not in acute distress.    Appearance: Normal appearance.  HENT:     Head: Normocephalic and atraumatic.  Eyes:     Extraocular Movements: Extraocular movements intact.     Pupils: Pupils are equal, round, and reactive to light.  Cardiovascular:     Rate and Rhythm: Normal rate and regular rhythm.     Pulses: Normal pulses.     Heart sounds: Normal heart sounds.  Pulmonary:     Effort: Pulmonary effort is normal. No respiratory distress.     Breath sounds: Normal breath sounds. No wheezing.  Abdominal:     General: Abdomen is flat. Bowel sounds are normal. There is no distension.     Palpations: Abdomen is soft.     Tenderness: There is no abdominal tenderness.  Musculoskeletal:     Cervical back: Normal range of motion and neck supple.     Right knee: Swelling and bony tenderness present. No effusion or erythema. Tenderness present.  Lymphadenopathy:     Cervical: No cervical adenopathy.  Skin:    General: Skin is warm and dry.     Findings: No erythema or rash.  Neurological:     General: No focal deficit present.     Mental Status: He is alert and oriented to person, place, and time.  Psychiatric:        Mood and Affect: Mood normal.        Behavior: Behavior normal.     Vital signs in last 24 hours: '@VSRANGES'$ @  Labs:  Estimated body mass index is 35 kg/m as calculated from the following:   Height as of 10/03/21: '5\' 9"'$  (1.753 m).   Weight  as of 10/03/21: 107.5 kg.  Imaging Review Plain radiographs demonstrate  s/p total knee for  degenerative joint disease of the right knee(s). The overall alignment is neutral.There is evidence of loosening of the tibial components. The bone quality appears to be good for age and reported  activity level.     Assessment/Plan:  End stage arthritis, right knee(s) with failed previous arthroplasty.   The patient history, physical examination, clinical judgment of the provider and imaging studies are consistent with end stage degenerative joint disease of the right knee(s), previous total knee arthroplasty. Revision total knee arthroplasty is deemed medically necessary. The treatment options including medical management, injection therapy, arthroscopy and revision arthroplasty were discussed at length. The risks and benefits of revision total knee arthroplasty were presented and reviewed. The risks due to aseptic loosening, infection, stiffness, patella tracking problems, thromboembolic complications and other imponderables were discussed. The patient acknowledged the explanation, agreed to proceed with the plan and consent was signed. Patient is being admitted for inpatient treatment for surgery, pain control, PT, OT, prophylactic antibiotics, VTE prophylaxis, progressive ambulation and ADL's and discharge planning.The patient is planning to be discharged home with home health services

## 2022-03-16 NOTE — H&P (View-Only) (Signed)
TOTAL KNEE REVISION ADMISSION H&P  Patient is being admitted for right revision total knee arthroplasty.  Subjective:  Chief Complaint:right knee pain.  HPI: Thomas Mcclure, 67 y.o. male, has a history of pain and functional disability in the right knee(s) due to failed previous arthroplasty and patient has failed non-surgical conservative treatments for greater than 12 weeks to include NSAID's and/or analgesics, flexibility and strengthening excercises, supervised PT with diminished ADL's post treatment, use of assistive devices, and activity modification. The indications for the revision of the total knee arthroplasty are loosening of one or more components. Onset of symptoms was gradual starting 2 years ago with gradually worsening course since that time.  Prior procedures on the right knee(s) include arthroplasty.  Patient currently rates pain in the right knee(s) at 10 out of 10 with activity. There is night pain, worsening of pain with activity and weight bearing, pain that interferes with activities of daily living, pain with passive range of motion, and joint swelling.  Patient has evidence of prosthetic loosening by imaging studies. This condition presents safety issues increasing the risk of falls.  There is no current active infection.  Patient Active Problem List   Diagnosis Date Noted   Atypical chest pain 09/15/2021   Primary localized osteoarthritis of right knee 11/09/2020   Chronic low back pain 02/09/2019   Spinal stenosis of lumbar region with neurogenic claudication 11/22/2018   Radiculopathy of lumbar region 05/31/2017   HCAP (healthcare-associated pneumonia) 05/30/2017   Leukocytosis 05/30/2017   Lumbar pseudoarthrosis 05/27/2017   Unstable angina ([place]) 02/18/2017   Primary localized osteoarthritis of right hip 10/03/2016   Chest pain, rule out acute myocardial infarction 05/01/2016   Diabetes mellitus type 2, controlled, with complications ([place]) 18/56/3149   Obesity  (BMI 30-39.9) 05/01/2016   Left ventricular aneurysm 12/03/2015   Ischemic cardiomyopathy    CAD (coronary artery disease) 07/16/2015   Hypokalemia 07/16/2015   History of non-ST elevation myocardial infarction (NSTEMI) 07/15/2015   Hypertension 07/15/2015   Hyperlipidemia with target LDL less than 100 07/15/2015   GERD (gastroesophageal reflux disease) 06/18/2012   Chronic radicular low back pain 06/18/2012   Past Medical History:  Diagnosis Date   Baker's cyst    Left calf   CAD in native artery, 07/15/15 PCI of RCA with DES 07/16/2015   a.   NSTEMI 5/17: LHC - pLAD 20, pLCx 20, OM1 30, pRCA 100, EF 45-50%>> PCI: 2.5 x 24 mm Promus DES to RCA  //  b.   Echo 5/17: mild LVH, EF 50-55%, no RWMA, mod RVE //  c. LHC 6/17: pLAD 20, pLCx 20, OM1 50, pRCA stent ok, EF 35-45% with mod sized inf wall and basal segment aneurysm   Chronic back pain    "down my back, down my legs" (02/18/2017)   Chronic combined systolic and diastolic HF (heart failure) (HCC)    Constipation    Constipation due to opioid therapy    GERD (gastroesophageal reflux disease)    04/07/2019- not current   Hyperlipidemia    Hypertension    Dr. [name] [phone]   Ischemic cardiomyopathy    a. LV-gram at time of LHC in 6/17 with EF 35-45%  //  b. 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"   Numbness and tingling of both lower extremities    "  on the outside of both sides" (02/18/2017)   Rheumatoid arthritis ([place])    RA   Tachycardia    Type II diabetes mellitus ([place])    diet controlled    Past Surgical History:  Procedure Laterality Date   BACK SURGERY     x3   bilateral cataract curgery      CARDIAC CATHETERIZATION N/A 07/15/2015   Procedure: Left Heart Cath and Coronary Angiography;  Surgeon: [name], MD;  Location: [facility];  Service: Cardiovascular;  Laterality: N/A;   CARDIAC CATHETERIZATION N/A 07/15/2015   Procedure: Coronary Stent Intervention;  Surgeon: [name], MD;  Location: [facility];  Service: Cardiovascular;  Laterality: N/A;   CARDIAC CATHETERIZATION N/A 08/07/2015   Procedure: Left Heart Cath and Coronary Angiography;  Surgeon: [name], MD;  Location: [facility];  Service: Cardiovascular;  Laterality: N/A;   CORONARY ANGIOPLASTY WITH STENT PLACEMENT  07/2015   LEFT HEART CATH AND CORONARY ANGIOGRAPHY N/A 02/19/2017   Procedure: LEFT HEART CATH AND CORONARY ANGIOGRAPHY;  Surgeon: Martinique, Peter M, MD;  Location: [facility];  Service: Cardiovascular;  Laterality: N/A;   LUMBAR FUSION  04/11/2019   REVISION OF THORACOLUMBAR FUSION    LUMBAR SPINE SURGERY  03/2009  & 2012   MASS EXCISION N/A 04/19/2012   Procedure: removal of posterior cervical lipoma;  Surgeon: [name], MD;  Location: [facility];  Service: Neurosurgery;  Laterality: N/A;  Removal of posterior cervical lipoma   POPLITEAL SYNOVIAL CYST EXCISION Left    "opened up behind my knee; scraped out arthritis"   POSTERIOR LUMBAR FUSION 4 LEVEL N/A 11/22/2018   Procedure: Posterior Lateral and Interbody fusion - Lumbar one-Lumbar two; explore fusion, posterior instrumentation and fusion Thoracic ten to the ilium;  Surgeon: Newman Pies, MD;  Location: [facility];  Service: Neurosurgery;  Laterality: N/A;   SPINAL CORD STIMULATOR INSERTION N/A 01/07/2013   Procedure:  SPINAL CORD STIMULATOR INSERTION;  Surgeon: [name], MD;  Location: [facility];  Service: Neurosurgery;  Laterality: N/A;   SPINAL CORD STIMULATOR INSERTION N/A 02/09/2019   Procedure: REPLACEMENT OF LUMBAR SPINAL CORD STIMULATOR;  Surgeon: Newman Pies, MD;  Location: [facility];  Service: Neurosurgery;  Laterality: N/A;  Thoracic/Lumbar spine   TONSILLECTOMY     patient denies   TOTAL HIP ARTHROPLASTY Right  10/03/2016   Procedure: TOTAL HIP ARTHROPLASTY;  Surgeon: [name], MD;  Location: [facility];  Service: Orthopedics;  Laterality: Right;   TOTAL KNEE ARTHROPLASTY Right 11/09/2020   Procedure: TOTAL KNEE ARTHROPLASTY;  Surgeon: [name], MD;  Location: [facility];  Service: Orthopedics;  Laterality: Right;   WISDOM TOOTH EXTRACTION     hx of    Current Outpatient Medications  Medication Sig Dispense Refill Last Dose   ACCU-CHEK AVIVA PLUS test strip       Accu-Chek Softclix Lancets lancets 4 (four) times daily.      acetaminophen (TYLENOL) 500 MG tablet Take 1,000 mg by mouth 2 (two) times daily as needed (pain.).      aspirin 81 MG chewable tablet Chew 1 tablet (81 mg total) by mouth daily. 30 tablet 2    atorvastatin (LIPITOR) 20 MG tablet Take 20 mg by mouth daily. (Patient not taking: Reported on 02/25/2022)      atorvastatin (LIPITOR) 20 MG tablet Take 1 tablet (20 mg total) by mouth at bedtime. (Patient not taking: Reported on 02/25/2022) 30 tablet 1  atorvastatin (LIPITOR) 20 MG tablet Take 1 tablet (20 mg total) by mouth at bedtime. 90 tablet 1    Buprenorphine HCl (BELBUCA) 900 MCG FILM Place 1 film inside cheeks 2 times daily. (Patient not taking: Reported on 02/25/2022) 60 each 0    Buprenorphine HCl (BELBUCA) 900 MCG FILM Dissolve 1 film inside cheeks 2 times daily 60 each 0    carvedilol (COREG) 12.5 MG tablet Take 1 tablet (12.5 mg total) by mouth 2 (two) times daily with a meal. (Patient not taking: Reported on 02/25/2022) 180 tablet 2    carvedilol (COREG) 12.5 MG tablet Take 1 tablet (12.5 mg total) by mouth 2 (two) times daily with a meal. (Patient not taking: Reported on 02/25/2022) 180 tablet 2    carvedilol (COREG) 12.5 MG tablet Take 1 tablet (12.5 mg total) by mouth 2 (two) times daily before a meal. (Patient not taking: Reported on 02/25/2022) 180 tablet 0    carvedilol (COREG) 12.5 MG tablet Take 1 tablet (12.5 mg total) by mouth 2 (two) times daily with a meal. 180 tablet 1     chlorhexidine (PERIDEX) 0.12 % solution 15 mLs by Mouth Rinse route 2 (two) times daily. (Patient not taking: Reported on 02/25/2022)      chlorthalidone (HYGROTON) 25 MG tablet Take 1 tablet (25 mg total) by mouth daily. (Patient not taking: Reported on 02/25/2022) 90 tablet 3    chlorthalidone (HYGROTON) 25 MG tablet Take 1 tablet (25 mg total) by mouth daily. (Patient not taking: Reported on 02/25/2022) 90 tablet 0    chlorthalidone (HYGROTON) 25 MG tablet Take 1 tablet (25 mg total) by mouth daily. 90 tablet 3    Continuous Blood Gluc Sensor (FREESTYLE LIBRE 2 SENSOR) MISC Use to test blood sugar, change every other week 6 each 4    cyclobenzaprine (FLEXERIL) 10 MG tablet Take 1 tablet (10 mg total) by mouth 2 (two) times daily. 180 tablet 0    dapagliflozin propanediol (FARXIGA) 10 MG TABS tablet Take 1 tablet (10 mg total) by mouth daily. (Patient not taking: Reported on 02/25/2022) 90 tablet 0    dapagliflozin propanediol (FARXIGA) 10 MG TABS tablet Take 1 tablet (10 mg total) by mouth daily. 90 tablet 0    diclofenac Sodium (VOLTAREN) 1 % GEL Apply 1 Application topically 2 (two) times daily as needed for pain. (Patient not taking: Reported on 02/25/2022)      diclofenac Sodium (VOLTAREN) 1 % GEL Apply small amount to the affected area 3 times a day as needed for pain 100 g 2    diltiazem (DILT-XR) 180 MG 24 hr capsule Take 1 capsule (180 mg total) by mouth daily. 90 capsule 3    docusate sodium (COLACE) 100 MG capsule Take 1 capsule (100 mg total) by mouth 2 (two) times daily. (Patient taking differently: Take 300 mg by mouth 2 (two) times daily.) 10 capsule 0    DULoxetine (CYMBALTA) 60 MG capsule Take 1 capsule by mouth daily (Patient not taking: Reported on 02/25/2022) 90 capsule 1    DULoxetine (CYMBALTA) 60 MG capsule Take 1 capsule (60 mg total) by mouth daily. (Patient not taking: Reported on 02/25/2022) 90 capsule 1    DULoxetine (CYMBALTA) 60 MG capsule Take 1 capsule (60 mg total) by mouth  daily. 90 capsule 1    glipiZIDE (GLUCOTROL XL) 10 MG 24 hr tablet Take 10 mg by mouth daily with breakfast. (Patient not taking: Reported on 02/25/2022)      glipiZIDE (GLUCOTROL XL) 10  MG 24 hr tablet Take 2 tablets (20 mg total) by mouth daily at breakfast. (Patient not taking: Reported on 02/25/2022) 90 tablet 1    glipiZIDE (GLUCOTROL XL) 10 MG 24 hr tablet Take 2 tablets by mouth daily at breakfast 90 tablet 0    glucose blood test strip Use to test 4 times a day as directed 150 each 11    linaclotide (LINZESS) 145 MCG CAPS capsule Take 1 capsule (145 mcg total) by mouth daily. (Patient not taking: Reported on 02/25/2022) 90 capsule 3    linaclotide (LINZESS) 145 MCG CAPS capsule Take 1 capsule (145 mcg total) by mouth daily. (Patient not taking: Reported on 02/25/2022) 90 capsule 3    linaclotide (LINZESS) 145 MCG CAPS capsule Take 1 capsule (145 mcg total) by mouth daily. (Patient not taking: Reported on 02/25/2022) 90 capsule 3    linaclotide (LINZESS) 145 MCG CAPS capsule Take 1 capsule (145 mcg total) by mouth daily. 90 capsule 3    LINZESS 145 MCG CAPS capsule Take 145 mcg by mouth as needed. (Patient not taking: Reported on 02/25/2022)      lisinopril (ZESTRIL) 20 MG tablet Take 1 tablet (20 mg total) by mouth daily. (Patient not taking: Reported on 02/25/2022) 90 tablet 1    lisinopril (ZESTRIL) 20 MG tablet Take 1 tablet (20 mg total) by mouth daily. (Patient not taking: Reported on 02/25/2022) 90 tablet 1    lisinopril (ZESTRIL) 20 MG tablet Take 1 tablet (20 mg total) by mouth daily. (Patient not taking: Reported on 02/25/2022) 90 tablet 0    lisinopril (ZESTRIL) 20 MG tablet Take 1 tablet (20 mg total) by mouth daily. 90 tablet 1    lubiprostone (AMITIZA) 24 MCG capsule Take 2 capsules (48 mcg total) by mouth at bedtime. (Patient not taking: Reported on 02/25/2022) 180 capsule 0    lubiprostone (AMITIZA) 24 MCG capsule Take 2 (two) Capsules by mouth at bedtime (Patient not taking: Reported on 02/25/2022)  180 capsule 0    lubiprostone (AMITIZA) 24 MCG capsule Take 2 capsules (48 mcg total) by mouth at bedtime. (Patient not taking: Reported on 02/25/2022) 180 capsule 0    lubiprostone (AMITIZA) 24 MCG capsule Take 2 capsules (48 mcg total) by mouth at bedtime. (Patient not taking: Reported on 02/25/2022) 180 capsule 1    lubiprostone (AMITIZA) 24 MCG capsule Take 2 capsules by mouth at bedtime. 180 capsule 0    metFORMIN (GLUCOPHAGE) 1000 MG tablet Take 1,000 mg by mouth 2 (two) times daily with a meal. (Patient not taking: Reported on 02/25/2022)      metFORMIN (GLUCOPHAGE) 1000 MG tablet Take 1 tablet (1,000 mg total) by mouth 2 (two) times daily. 180 tablet 1    methocarbamol (ROBAXIN) 750 MG tablet Take 1 tablet (750 mg total) by mouth every 6 (six) hours as needed for muscle spasms. (Patient not taking: Reported on 02/25/2022) 40 tablet 2    methocarbamol (ROBAXIN) 750 MG tablet Take 1 tablet by mouth 2 times daily as needed. (Patient not taking: Reported on 02/25/2022) 60 tablet 4    methocarbamol (ROBAXIN) 750 MG tablet Take 1 tablet (750 mg total) by mouth 2 (two) times daily as needed. 60 tablet 4    Multiple Vitamin (MULTIVITAMIN WITH MINERALS) TABS tablet Take 1 tablet by mouth daily.      naloxone (NARCAN) nasal spray 4 mg/0.1 mL Place 1 spray into the nose as needed. If found unresposive then spray this into nose and call 911 immediately (Patient not  taking: Reported on 02/25/2022) 2 each 3    omeprazole (PRILOSEC) 40 MG capsule Take 1 capsule (40 mg total) by mouth daily. (Patient not taking: Reported on 02/25/2022) 90 capsule 0    omeprazole (PRILOSEC) 40 MG capsule Take 1 capsule (40 mg total) by mouth daily. 90 capsule 0    oxyCODONE (OXY IR/ROXICODONE) 5 MG immediate release tablet Take 1 tablet (5 mg total) by mouth every 6 (six) hours as needed for pain. (Patient not taking: Reported on 02/25/2022) 20 tablet 0    oxyCODONE (OXY IR/ROXICODONE) 5 MG immediate release tablet Take 1 tablet by mouth every  6 hours as needed for pain (Patient not taking: Reported on 02/25/2022) 20 tablet 0    oxyCODONE (OXY IR/ROXICODONE) 5 MG immediate release tablet Take 1 tablet (5 mg total) by mouth every 6 (six) hours as needed for pain. 20 tablet 0    oxyCODONE (OXY IR/ROXICODONE) 5 MG immediate release tablet Take 1 tablet by mouth every 6 hours as needed for pain 20 tablet 0    Oxycodone HCl 20 MG TABS Take 1 tablet (20 mg total) by mouth every 6 (six) hours as needed. (Patient not taking: Reported on 02/25/2022) 120 tablet 0    Oxycodone HCl 20 MG TABS Take 1 (one) tablet by mouth every six hours as needed (Patient not taking: Reported on 02/25/2022) 120 tablet 0    Oxycodone HCl 20 MG TABS Take 1 tablet by mouth every 6 hours as needed (Patient not taking: Reported on 02/25/2022) 120 tablet 0    Oxycodone HCl 20 MG TABS Take 1 tablet (20 mg total) by mouth every 6 (six) hours as needed. (Patient not taking: Reported on 02/25/2022) 120 tablet 0    Oxycodone HCl 20 MG TABS Take 1 tablet by mouth every 6 hours, as needed 120 tablet 0    Potassium Chloride ER 20 MEQ TBCR Take 1 tablet by mouth daily (Patient not taking: Reported on 10/03/2021) 30 tablet 0    Potassium Chloride ER 20 MEQ TBCR Take 1 tablet by mouth daily. (Patient not taking: Reported on 02/25/2022) 90 tablet 0    potassium chloride SA (KLOR-CON M) 20 MEQ tablet Take 1 tablet (20 mEq total) by mouth daily. (Patient not taking: Reported on 02/25/2022) 90 tablet 0    predniSONE (STERAPRED UNI-PAK 48 TAB) 5 MG (48) TBPK tablet Take 10 mg by mouth 2 (two) times daily. (Patient not taking: Reported on 02/25/2022)      pregabalin (LYRICA) 150 MG capsule Take 1 capsule by mouth two times daily (Patient not taking: Reported on 02/25/2022) 60 capsule 0    pregabalin (LYRICA) 150 MG capsule Take 1 capsule by mouth 2 times daily (Patient not taking: Reported on 02/25/2022) 60 capsule 0    pregabalin (LYRICA) 150 MG capsule Take 1 (one) Capsule by mouth two times daily (Patient not  taking: Reported on 02/25/2022) 60 capsule 0    pregabalin (LYRICA) 150 MG capsule Take 1 capsule (150 mg total) by mouth 2 (two) times daily. (Patient not taking: Reported on 02/25/2022) 60 capsule 0    pregabalin (LYRICA) 150 MG capsule Take 1 capsule (150 mg total) by mouth 2 (two) times daily. (Patient not taking: Reported on 02/25/2022) 60 capsule 0    pregabalin (LYRICA) 150 MG capsule Take 1 capsule (150 mg total) by mouth 2 (two) times daily. (Patient not taking: Reported on 02/25/2022) 60 capsule 0    pregabalin (LYRICA) 150 MG capsule Take 1 capsule by mouth  2 times daily 60 capsule 0    sildenafil (VIAGRA) 100 MG tablet Take 100 mg by mouth as needed for erectile dysfunction. (Patient not taking: Reported on 02/25/2022)      sildenafil (VIAGRA) 100 MG tablet Take 1 tablet (100 mg total) by mouth daily as needed. 10 tablet 0    No current facility-administered medications for this visit.   Allergies  Allergen Reactions   Brilinta [Ticagrelor] Shortness Of Breath and Other (See Comments)    CHEST PAIN    Methylprednisolone Other (See Comments)    DIAPHORESIS, HYPOTENSION   Prednisone Other (See Comments)    DIAPHORESIS, HYPOTENSION   Toradol [Ketorolac Tromethamine] Other (See Comments)    DIAPHORESIS, HYPOTENSION     Adhesive [Tape] Rash and Other (See Comments)    "blisters" ( NO SURGICAL TAPE, PLEASE)    Social History   Tobacco Use   Smoking status: Former    Packs/day: 0.25    Years: 40.00    Total pack years: 10.00    Types: Cigarettes    Quit date: 2008    Years since quitting: 16.0   Smokeless tobacco: Never   Tobacco comments:    02/18/2017 "quit in 2012"  Substance Use Topics   Alcohol use: No    Alcohol/week: 0.0 standard drinks of alcohol    Family History  Problem Relation Age of Onset   Heart disease Mother    Prostate cancer Brother       Review of Systems  Cardiovascular:  Positive for chest pain, palpitations and leg swelling.  Gastrointestinal:   Positive for constipation.  Musculoskeletal:  Positive for arthralgias and joint swelling.  Neurological:  Positive for headaches.  All other systems reviewed and are negative.    Objective:  Physical Exam Constitutional:      General: He is not in acute distress.    Appearance: Normal appearance.  HENT:     Head: Normocephalic and atraumatic.  Eyes:     Extraocular Movements: Extraocular movements intact.     Pupils: Pupils are equal, round, and reactive to light.  Cardiovascular:     Rate and Rhythm: Normal rate and regular rhythm.     Pulses: Normal pulses.     Heart sounds: Normal heart sounds.  Pulmonary:     Effort: Pulmonary effort is normal. No respiratory distress.     Breath sounds: Normal breath sounds. No wheezing.  Abdominal:     General: Abdomen is flat. Bowel sounds are normal. There is no distension.     Palpations: Abdomen is soft.     Tenderness: There is no abdominal tenderness.  Musculoskeletal:     Cervical back: Normal range of motion and neck supple.     Right knee: Swelling and bony tenderness present. No effusion or erythema. Tenderness present.  Lymphadenopathy:     Cervical: No cervical adenopathy.  Skin:    General: Skin is warm and dry.     Findings: No erythema or rash.  Neurological:     General: No focal deficit present.     Mental Status: He is alert and oriented to person, place, and time.  Psychiatric:        Mood and Affect: Mood normal.        Behavior: Behavior normal.     Vital signs in last 24 hours: '@VSRANGES'$ @  Labs:  Estimated body mass index is 35 kg/m as calculated from the following:   Height as of 10/03/21: '5\' 9"'$  (1.753 m).   Weight  as of 10/03/21: 107.5 kg.  Imaging Review Plain radiographs demonstrate  s/p total knee for  degenerative joint disease of the right knee(s). The overall alignment is neutral.There is evidence of loosening of the tibial components. The bone quality appears to be good for age and reported  activity level.     Assessment/Plan:  End stage arthritis, right knee(s) with failed previous arthroplasty.   The patient history, physical examination, clinical judgment of the provider and imaging studies are consistent with end stage degenerative joint disease of the right knee(s), previous total knee arthroplasty. Revision total knee arthroplasty is deemed medically necessary. The treatment options including medical management, injection therapy, arthroscopy and revision arthroplasty were discussed at length. The risks and benefits of revision total knee arthroplasty were presented and reviewed. The risks due to aseptic loosening, infection, stiffness, patella tracking problems, thromboembolic complications and other imponderables were discussed. The patient acknowledged the explanation, agreed to proceed with the plan and consent was signed. Patient is being admitted for inpatient treatment for surgery, pain control, PT, OT, prophylactic antibiotics, VTE prophylaxis, progressive ambulation and ADL's and discharge planning.The patient is planning to be discharged home with home health services

## 2022-03-18 ENCOUNTER — Other Ambulatory Visit (HOSPITAL_COMMUNITY): Payer: Self-pay

## 2022-03-18 MED ORDER — OXYCODONE HCL 5 MG PO TABS
5.0000 mg | ORAL_TABLET | Freq: Four times a day (QID) | ORAL | 0 refills | Status: DC | PRN
  Filled 2022-03-18: qty 20, 5d supply, fill #0

## 2022-03-19 ENCOUNTER — Other Ambulatory Visit (HOSPITAL_COMMUNITY): Payer: Self-pay

## 2022-03-20 ENCOUNTER — Other Ambulatory Visit (HOSPITAL_COMMUNITY): Payer: Self-pay

## 2022-03-21 ENCOUNTER — Other Ambulatory Visit: Payer: Self-pay

## 2022-03-21 NOTE — Pre-Procedure Instructions (Signed)
Surgical Instructions    Your procedure is scheduled on Wednesday, April 02, 2022 at 1:00 PM.  Report to [facility] Main Entrance "A" at 11:00 A.M., then check in with the Admitting office.  Call this number if you have problems the morning of surgery:  [phone]   If you have any questions prior to your surgery date call [phone]: Open Monday-Friday 8am-4pm  *If you experience any cold or flu symptoms such as cough, fever, chills, shortness of breath, etc. between now and your scheduled surgery, please notify us.*    Remember:  Do not eat after midnight the night before your surgery  You may drink clear liquids until 10:00 AM the morning of your surgery.   Clear liquids allowed are: Water, Non-Citrus Juices (without pulp), Carbonated Beverages, Clear Tea, Black Coffee Only (NO MILK, CREAM OR POWDERED CREAMER of any kind), and Gatorade.  Patient Instructions  The night before surgery:  No food after midnight. ONLY clear liquids after midnight  The day of surgery (if you have diabetes): Drink ONE (1) 12 oz G2 given to you in your pre admission testing appointment by 10:00 AM the morning of surgery. Drink in one sitting. Do not sip.  This drink was given to you during your hospital  pre-op appointment visit.  Nothing else to drink after completing the  12 oz bottle of G2.         If you have questions, please contact your surgeon's office.     Take these medicines the morning of surgery with A SIP OF WATER:  carvedilol (COREG)  cyclobenzaprine (FLEXERIL)  diltiazem (DILT-XR)  DULoxetine (CYMBALTA)  linaclotide (LINZESS)  omeprazole (PRILOSEC)  pregabalin (LYRICA)   IF NEEDED: acetaminophen (TYLENOL)  methocarbamol (ROBAXIN)  oxyCODONE (OXY IR/ROXICODONE)    Contact your provider who prescribed Buprenorphine HCl (BELBUCA) regarding pre-op instructions for medication.    As of today, STOP taking any Aspirin (unless otherwise instructed by your surgeon)  Aleve, Naproxen, Ibuprofen, Motrin, Advil, Goody's, BC's, all herbal medications, fish oil, and all vitamins. This includes your diclofenac Sodium (VOLTAREN) 1 % GEL.  WHAT DO I DO ABOUT MY DIABETES MEDICATION?   Do not take glipiZIDE (GLUCOTROL XL)  the morning of surgery.  Stop taking dapagliflozin propanediol (FARXIGA) 72 hours prior to surgery. Your last dose should be on 03/29/22.   HOW TO MANAGE YOUR DIABETES BEFORE AND AFTER SURGERY  Why is it important to control my blood sugar before and after surgery? Improving blood sugar levels before and after surgery helps healing and can limit problems. A way of improving blood sugar control is eating a healthy diet by:  Eating less sugar and carbohydrates  Increasing activity/exercise  Talking with your doctor about reaching your blood sugar goals High blood sugars (greater than 180 mg/dL) can raise your risk of infections and slow your recovery, so you will need to focus on controlling your diabetes during the weeks before surgery. Make sure that the doctor who takes care of your diabetes knows about your planned surgery including the date and location.  How do I manage my blood sugar before surgery? Check your blood sugar at least 4 times a day, starting 2 days before surgery, to make sure that the level is not too high or low.  Check your blood sugar the morning of your surgery when you wake up and every 2 hours until you get to the Short Stay unit.  If your blood sugar is less than 70 mg/dL, you will  need to treat for low blood sugar: Do not take insulin. Treat a low blood sugar (less than 70 mg/dL) with  cup of clear juice (cranberry or apple), 4 glucose tablets, OR glucose gel. Recheck blood sugar in 15 minutes after treatment (to make sure it is greater than 70 mg/dL). If your blood sugar is not greater than 70 mg/dL on recheck, call [phone] for further instructions. Report your blood sugar to the short stay nurse when you  get to Short Stay.  If you are admitted to the hospital after surgery: Your blood sugar will be checked by the staff and you will probably be given insulin after surgery (instead of oral diabetes medicines) to make sure you have good blood sugar levels. The goal for blood sugar control after surgery is 80-180 mg/dL.                      Do NOT Smoke (Tobacco/Vaping) for 24 hours prior to your procedure.  If you use a CPAP at night, you may bring your mask/headgear for your overnight stay.   Contacts, glasses, piercing's, hearing aid's, dentures or partials may not be worn into surgery, please bring cases for these belongings.    For patients admitted to the hospital, discharge time will be determined by your treatment team.   Patients discharged the day of surgery will not be allowed to drive home, and someone needs to stay with them for 24 hours.  SURGICAL WAITING ROOM VISITATION Patients having surgery or a procedure may have two support people in the waiting area. Visitors may stay in the waiting area during the procedure and switch out with other visitors if needed. Only 1 support person is allowed in the pre-op area with the patient AFTER the patient is prepped. This person cannot be switched out. Children under the age of 80 must have an adult accompany them who is not the patient. If the patient needs to stay at the hospital during part of their recovery, the visitor guidelines for inpatient rooms apply.  Please refer to the [facility] website for the visitor guidelines for Inpatients (after your surgery is over and you are in a regular room).    Special instructions:   [place]- Preparing For Surgery  Before surgery, you can play an important role. Because skin is not sterile, your skin needs to be as free of germs as possible. You can reduce the number of germs on your skin by washing with CHG (chlorahexidine gluconate) Soap before surgery.  CHG is an antiseptic cleaner  which kills germs and bonds with the skin to continue killing germs even after washing.    Oral Hygiene is also important to reduce your risk of infection.  Remember - BRUSH YOUR TEETH THE MORNING OF SURGERY WITH YOUR REGULAR TOOTHPASTE  Please do not use if you have an allergy to CHG or antibacterial soaps. If your skin becomes reddened/irritated stop using the CHG.  Do not shave (including legs and underarms) for at least 48 hours prior to first CHG shower. It is OK to shave your face.  Please follow these instructions carefully.   Shower the NIGHT BEFORE SURGERY and the MORNING OF SURGERY  If you chose to wash your hair, wash your hair first as usual with your normal shampoo.  After you shampoo, rinse your hair and body thoroughly to remove the shampoo.  Use CHG Soap as you would any other liquid soap. You can apply CHG directly to the  skin and wash gently with a scrungie or a clean washcloth.   Apply the CHG Soap to your body ONLY FROM THE NECK DOWN.  Do not use on open wounds or open sores. Avoid contact with your eyes, ears, mouth and genitals (private parts). Wash Face and genitals (private parts)  with your normal soap.   Wash thoroughly, paying special attention to the area where your surgery will be performed.  Thoroughly rinse your body with warm water from the neck down.  DO NOT shower/wash with your normal soap after using and rinsing off the CHG Soap.  Pat yourself dry with a CLEAN TOWEL.  Wear CLEAN PAJAMAS to bed the night before surgery  Place CLEAN SHEETS on your bed the night before your surgery  DO NOT SLEEP WITH PETS.   Day of Surgery: Take a shower with CHG soap. Do not wear jewelry or makeup Do not wear lotions, powders, perfumes/colognes, or deodorant. Do not shave 48 hours prior to surgery.  Men may shave face and neck. Do not wear nail polish, gel polish, artificial nails, or any other type of covering on natural nails (fingers and toes) If you have  artificial nails or gel coating that need to be removed by a nail salon, please have this removed prior to surgery. Artificial nails or gel coating may interfere with anesthesia's ability to adequately monitor your vital signs. Wear Clean/Comfortable clothing the morning of surgery Do not bring valuables to the hospital.  [facility] is not responsible for any belongings or valuables. Remember to brush your teeth WITH YOUR REGULAR TOOTHPASTE.   Please read over the following fact sheets that you were given.  If you received a COVID test during your pre-op visit  it is requested that you wear a mask when out in public, stay away from anyone that may not be feeling well and notify your surgeon if you develop symptoms. If you have been in contact with anyone that has tested positive in the last 10 days please notify you surgeon.

## 2022-03-24 ENCOUNTER — Encounter (HOSPITAL_COMMUNITY)
Admission: RE | Admit: 2022-03-24 | Discharge: 2022-03-24 | Disposition: A | Discharge status: Home / Self Care | Payer: 59 | Source: Ambulatory Visit | Attending: Orthopedic Surgery | Admitting: Orthopedic Surgery

## 2022-03-24 ENCOUNTER — Encounter (HOSPITAL_COMMUNITY): Payer: Self-pay

## 2022-03-24 ENCOUNTER — Other Ambulatory Visit: Payer: Self-pay

## 2022-03-24 VITALS — BP 131/87 | HR 87 | Temp 97.6°F | Resp 18 | Ht 69.0 in | Wt 245.8 lb

## 2022-03-24 DIAGNOSIS — Z01818 Encounter for other preprocedural examination: Secondary | ICD-10-CM | POA: Diagnosis present

## 2022-03-24 DIAGNOSIS — E119 Type 2 diabetes mellitus without complications: Secondary | ICD-10-CM | POA: Diagnosis not present

## 2022-03-24 DIAGNOSIS — G8929 Other chronic pain: Secondary | ICD-10-CM | POA: Diagnosis not present

## 2022-03-24 DIAGNOSIS — M25561 Pain in right knee: Secondary | ICD-10-CM | POA: Diagnosis not present

## 2022-03-24 HISTORY — DX: Fatty (change of) liver, not elsewhere classified: K76.0

## 2022-03-24 HISTORY — DX: Aneurysm of iliac artery: I72.3

## 2022-03-24 LAB — CBC WITH DIFFERENTIAL/PLATELET
Abs Immature Granulocytes: 0.01 10*3/uL (ref 0.00–0.07)
Basophils Absolute: 0 10*3/uL (ref 0.0–0.1)
Basophils Relative: 0 %
Eosinophils Absolute: 0.4 10*3/uL (ref 0.0–0.5)
Eosinophils Relative: 6 %
HCT: 42.2 % (ref 39.0–52.0)
Hemoglobin: 13.2 g/dL (ref 13.0–17.0)
Immature Granulocytes: 0 %
Lymphocytes Relative: 29 %
Lymphs Abs: 1.9 10*3/uL (ref 0.7–4.0)
MCH: 24.5 pg — ABNORMAL LOW (ref 26.0–34.0)
MCHC: 31.3 g/dL (ref 30.0–36.0)
MCV: 78.4 fL — ABNORMAL LOW (ref 80.0–100.0)
Monocytes Absolute: 0.6 10*3/uL (ref 0.1–1.0)
Monocytes Relative: 8 %
Neutro Abs: 3.9 10*3/uL (ref 1.7–7.7)
Neutrophils Relative %: 57 %
Platelets: 227 10*3/uL (ref 150–400)
RBC: 5.38 MIL/uL (ref 4.22–5.81)
RDW: 18.9 % — ABNORMAL HIGH (ref 11.5–15.5)
WBC: 6.8 10*3/uL (ref 4.0–10.5)
nRBC: 0 % (ref 0.0–0.2)

## 2022-03-24 LAB — COMPREHENSIVE METABOLIC PANEL
ALT: 22 U/L (ref 0–44)
AST: 28 U/L (ref 15–41)
Albumin: 4 g/dL (ref 3.5–5.0)
Alkaline Phosphatase: 89 U/L (ref 38–126)
Anion gap: 10 (ref 5–15)
BUN: 15 mg/dL (ref 8–23)
CO2: 29 mmol/L (ref 22–32)
Calcium: 9.2 mg/dL (ref 8.9–10.3)
Chloride: 99 mmol/L (ref 98–111)
Creatinine, Ser: 1.12 mg/dL (ref 0.61–1.24)
GFR, Estimated: >60 mL/min (ref >60)
Glucose, Bld: 131 mg/dL — ABNORMAL HIGH (ref 70–99)
Potassium: 3.6 mmol/L (ref 3.5–5.1)
Sodium: 138 mmol/L (ref 135–145)
Total Bilirubin: 0.4 mg/dL (ref 0.3–1.2)
Total Protein: 7.1 g/dL (ref 6.5–8.1)

## 2022-03-24 LAB — TYPE AND SCREEN
ABO/RH(D): B POS
Antibody Screen: NEGATIVE

## 2022-03-24 LAB — HEMOGLOBIN A1C
Hgb A1c MFr Bld: 6.6 % — ABNORMAL HIGH (ref 4.8–5.6)
Mean Plasma Glucose: 142.72 mg/dL

## 2022-03-24 LAB — GLUCOSE, CAPILLARY: Glucose-Capillary: 220 mg/dL — ABNORMAL HIGH (ref 70–99)

## 2022-03-24 LAB — SURGICAL PCR SCREEN
MRSA, PCR: NEGATIVE
Staphylococcus aureus: NEGATIVE

## 2022-03-24 NOTE — Pre-Procedure Instructions (Addendum)
Surgical Instructions    Your procedure is scheduled on Wednesday, April 02, 2022 at 1:00 PM.  Report to [facility] Main Entrance "A" at 11:00 A.M., then check in with the Admitting office.  Call this number if you have problems the morning of surgery:  [phone]   If you have any questions prior to your surgery date call [phone]: Open Monday-Friday 8am-4pm  *If you experience any cold or flu symptoms such as cough, fever, chills, shortness of breath, etc. between now and your scheduled surgery, please notify us.*    Remember:  Do not eat after midnight the night before your surgery  You may drink clear liquids until 10:00 AM the morning of your surgery.   Clear liquids allowed are: Water, Non-Citrus Juices (without pulp), Carbonated Beverages, Clear Tea, Black Coffee Only (NO MILK, CREAM OR POWDERED CREAMER of any kind), and Gatorade.  Patient Instructions  The night before surgery:  No food after midnight. ONLY clear liquids after midnight  The day of surgery (if you have diabetes): Drink ONE (1) 12 oz G2 given to you in your pre admission testing appointment by 10:00 AM the morning of surgery. Drink in one sitting. Do not sip.  This drink was given to you during your hospital  pre-op appointment visit.  Nothing else to drink after completing the  12 oz bottle of G2.         If you have questions, please contact your surgeon's office.     Take these medicines the morning of surgery with A SIP OF WATER:  carvedilol (COREG)  cyclobenzaprine (FLEXERIL)  diltiazem (DILT-XR)  DULoxetine (CYMBALTA)  linaclotide (LINZESS)  omeprazole (PRILOSEC)  pregabalin (LYRICA)   IF NEEDED: acetaminophen (TYLENOL)  methocarbamol (ROBAXIN)  oxyCODONE (OXY IR/ROXICODONE) or Oxycodone HCl   Contact your provider who prescribed Buprenorphine HCl (BELBUCA) regarding pre-op instructions for medication.   Follow your surgeon's instructions on when to stop Aspirin.  If no  instructions were given by your surgeon then you will need to call the office to get those instructions.    As of today, STOP taking any Aleve, Naproxen, Ibuprofen, Motrin, Advil, Goody's, BC's, all herbal medications, fish oil, and all vitamins. This includes your diclofenac Sodium (VOLTAREN) 1 % GEL.  WHAT DO I DO ABOUT MY DIABETES MEDICATION?   Do not take metFORMIN (GLUCOPHAGE) or glipiZIDE (GLUCOTROL XL)  the morning of surgery.  Stop taking dapagliflozin propanediol (FARXIGA) 72 hours prior to surgery. Your last dose should be on 03/29/22.   HOW TO MANAGE YOUR DIABETES BEFORE AND AFTER SURGERY  Why is it important to control my blood sugar before and after surgery? Improving blood sugar levels before and after surgery helps healing and can limit problems. A way of improving blood sugar control is eating a healthy diet by:  Eating less sugar and carbohydrates  Increasing activity/exercise  Talking with your doctor about reaching your blood sugar goals High blood sugars (greater than 180 mg/dL) can raise your risk of infections and slow your recovery, so you will need to focus on controlling your diabetes during the weeks before surgery. Make sure that the doctor who takes care of your diabetes knows about your planned surgery including the date and location.  How do I manage my blood sugar before surgery? Check your blood sugar at least 4 times a day, starting 2 days before surgery, to make sure that the level is not too high or low.  Check your blood sugar the morning of  your surgery when you wake up and every 2 hours until you get to the Short Stay unit.  If your blood sugar is less than 70 mg/dL, you will need to treat for low blood sugar: Do not take insulin. Treat a low blood sugar (less than 70 mg/dL) with  cup of clear juice (cranberry or apple), 4 glucose tablets, OR glucose gel. Recheck blood sugar in 15 minutes after treatment (to make sure it is greater than 70 mg/dL).  If your blood sugar is not greater than 70 mg/dL on recheck, call [phone] for further instructions. Report your blood sugar to the short stay nurse when you get to Short Stay.  If you are admitted to the hospital after surgery: Your blood sugar will be checked by the staff and you will probably be given insulin after surgery (instead of oral diabetes medicines) to make sure you have good blood sugar levels. The goal for blood sugar control after surgery is 80-180 mg/dL.                      Do NOT Smoke (Tobacco/Vaping) for 24 hours prior to your procedure.  If you use a CPAP at night, you may bring your mask/headgear for your overnight stay.   Contacts, glasses, piercing's, hearing aid's, dentures or partials may not be worn into surgery, please bring cases for these belongings.    For patients admitted to the hospital, discharge time will be determined by your treatment team.   Patients discharged the day of surgery will not be allowed to drive home, and someone needs to stay with them for 24 hours.  SURGICAL WAITING ROOM VISITATION Patients having surgery or a procedure may have two support people in the waiting area. Visitors may stay in the waiting area during the procedure and switch out with other visitors if needed. Only 1 support person is allowed in the pre-op area with the patient AFTER the patient is prepped. This person cannot be switched out. Children under the age of 18 must have an adult accompany them who is not the patient. If the patient needs to stay at the hospital during part of their recovery, the visitor guidelines for inpatient rooms apply.  Please refer to the [facility] website for the visitor guidelines for Inpatients (after your surgery is over and you are in a regular room).    Special instructions:   [place]- Preparing For Surgery  Before surgery, you can play an important role. Because skin is not sterile, your skin needs to be as free of germs  as possible. You can reduce the number of germs on your skin by washing with CHG (chlorahexidine gluconate) Soap before surgery.  CHG is an antiseptic cleaner which kills germs and bonds with the skin to continue killing germs even after washing.    Oral Hygiene is also important to reduce your risk of infection.  Remember - BRUSH YOUR TEETH THE MORNING OF SURGERY WITH YOUR REGULAR TOOTHPASTE  Please do not use if you have an allergy to CHG or antibacterial soaps. If your skin becomes reddened/irritated stop using the CHG.  Do not shave (including legs and underarms) for at least 48 hours prior to first CHG shower. It is OK to shave your face.  Please follow these instructions carefully.   Shower the NIGHT BEFORE SURGERY and the MORNING OF SURGERY  If you chose to wash your hair, wash your hair first as usual with your normal shampoo.  After  you shampoo, rinse your hair and body thoroughly to remove the shampoo.  Use CHG Soap as you would any other liquid soap. You can apply CHG directly to the skin and wash gently with a scrungie or a clean washcloth.   Apply the CHG Soap to your body ONLY FROM THE NECK DOWN.  Do not use on open wounds or open sores. Avoid contact with your eyes, ears, mouth and genitals (private parts). Wash Face and genitals (private parts)  with your normal soap.   Wash thoroughly, paying special attention to the area where your surgery will be performed.  Thoroughly rinse your body with warm water from the neck down.  DO NOT shower/wash with your normal soap after using and rinsing off the CHG Soap.  Pat yourself dry with a CLEAN TOWEL.  Wear CLEAN PAJAMAS to bed the night before surgery  Place CLEAN SHEETS on your bed the night before your surgery  DO NOT SLEEP WITH PETS.   Day of Surgery: Take a shower with CHG soap. Do not wear jewelry. Do not wear lotions, powders, colognes, or deodorant. Men may shave face and neck. Wear Clean/Comfortable clothing the  morning of surgery Do not bring valuables to the hospital.  [facility] is not responsible for any belongings or valuables. Remember to brush your teeth WITH YOUR REGULAR TOOTHPASTE.   Please read over the following fact sheets that you were given.  If you received a COVID test during your pre-op visit  it is requested that you wear a mask when out in public, stay away from anyone that may not be feeling well and notify your surgeon if you develop symptoms. If you have been in contact with anyone that has tested positive in the last 10 days please notify you surgeon.

## 2022-03-24 NOTE — Progress Notes (Signed)
PCP - Dr. Catalina Gravel Cardiologist - Dr. Daneen Schick  PPM/ICD - n/a  Pt has spinal stimulator. Instructed to bring remote with him on DOS.  Chest x-ray - n/a EKG - 09/15/21 Stress Test - 09/26/18 ECHO - 10/15/18 Cardiac Cath - 02/19/17  Sleep Study - Pt states he has has a negative sleep study within the last 5 years. Stop bang scored 5 but pt has tested negative for OSA. CPAP - denies use  Fasting Blood Sugar - 124-136 Checks Blood Sugar 2-3 times a week. Last A1C 6.6, 6 months ago. Will collect A1C today.  Last dose of GLP1 agonist-  n/a GLP1 instructions: n/a  Blood Thinner Instructions: n/a Aspirin Instructions:LD 03/23/22.  ERAS Protcol -Clear liquids until 1000 DOS PRE-SURGERY Ensure or G2- G2 provided  COVID TEST- n/a   Anesthesia review: Yes, hx of CAD. Cardiac clearance received on 02/27/22.   Patient denies shortness of breath, fever, cough and chest pain at PAT appointment   All instructions explained to the patient, with a verbal understanding of the material. Patient agrees to go over the instructions while at home for a better understanding. Patient also instructed to self quarantine after being tested for COVID-19. The opportunity to ask questions was provided.

## 2022-03-25 ENCOUNTER — Other Ambulatory Visit: Payer: Self-pay

## 2022-03-25 ENCOUNTER — Encounter (HOSPITAL_COMMUNITY): Payer: Self-pay

## 2022-03-25 ENCOUNTER — Other Ambulatory Visit (HOSPITAL_COMMUNITY): Payer: Self-pay

## 2022-03-25 MED ORDER — BELBUCA 900 MCG BU FILM
ORAL_FILM | BUCCAL | 0 refills | Status: DC
  Filled 2022-03-25: qty 60, 30d supply, fill #0

## 2022-03-25 MED ORDER — OXYCODONE HCL 20 MG PO TABS
20.0000 mg | ORAL_TABLET | Freq: Four times a day (QID) | ORAL | 0 refills | Status: DC | PRN
  Filled 2022-03-25: qty 120, 30d supply, fill #0

## 2022-03-25 MED ORDER — PREGABALIN 150 MG PO CAPS: 150.0000 mg | ORAL_CAPSULE | Freq: Two times a day (BID) | ORAL | 0 refills | Status: DC

## 2022-03-25 NOTE — Progress Notes (Signed)
Anesthesia Chart Review:  Case: [id number] Date/Time: 04/02/22 1245   Procedure: TOTAL KNEE REVISION (Right: Knee)   Anesthesia type: Spinal   Pre-op diagnosis: FAILED RIGHT TOTAL KNEE PROSTHESIS   Location: [facility] ROOM 06 / [facility]   Surgeons: Willaim Sheng, MD       DISCUSSION: Patient is a 67 year old male scheduled for the above procedure.  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), GERD, RA, fatty liver, chronic pain (on Belbuca and oxycodone PRN; has spinal cord stimulator), spinal surgery (L3-S1 PLIF 03/05/09; placement of Spectra SCS generator 01/07/13 with removal/replacment with [facility] Scientific SCS 02/09/19; L2-3 TLIF, posterior instrumentation L2-ilium, posterolateral arthrodesis L2-3 & L5-S1 05/27/17; L1-2 TLIF, posterolateral arthrodesis T10-L2 11/22/18; exploration fusion, removal of thoracolumbar hardware, redo posterolateral arthrodesisi T10-L2, posterior thoracolumbar instrumentation T10-L4 04/11/19), osteoarthritis (right THA 10/03/16; right TKA 11/09/20)  Preoperative cardiology input outlined on 02/27/22 by Rebekah Chesterfield, NP. He wrote, " Preoperative Cardiovascular Risk Assessment: { The patient affirms he has been doing well without any new cardiac symptoms. They are able to achieve 5 METS without cardiac limitations. Therefore, based on ACC/AHA guidelines, the patient would be at acceptable risk for the planned procedure without further cardiovascular testing...  Mr. Mcwhirter perioperative risk of a major cardiac event is 6.6% according to the Revised Cardiac Risk Index (RCRI).  Therefore, he is at low risk for perioperative complications.   His functional capacity is fair at 5.59 METs according to the Duke Activity Status Index (DASI). Recommendations: According to ACC/AHA  guidelines, no further cardiovascular testing needed.  The patient may proceed to surgery at acceptable risk.   Antiplatelet and/or Anticoagulation Recommendations: Aspirin can be held for 5-7 days prior to his surgery.  Please resume Aspirin post operatively when it is felt to be safe from a bleeding standpoint." LVEF 45-50%, inferior basal hypokinesis by 10/15/18 echo.  He has a spinal cord stimulator and will bring the remote with him on the day of surgery.   Anesthesia team to evaluate on the day of surgery.    VS: BP 131/87   Pulse 87   Temp 36.4 C (Oral)   Resp 18   Ht '5\' 9"'$  (1.753 m)   Wt 111.5 kg   SpO2 98%   BMI 36.30 kg/m    PROVIDERS: Selena Batten, MD is PCP  Daneen Schick, MD is cardiologist, recently retired.   Deitra Mayo, MD is vascular surgeon. Last visit 10/03/21 with 18 month follow-up of 1. 5 cm left IIA aneurysm recommended.    LABS: Labs reviewed: Acceptable for surgery. (all labs ordered are listed, but only abnormal results are displayed)  Labs Reviewed  GLUCOSE, CAPILLARY - Abnormal; Notable for the following components:      Result Value   Glucose-Capillary 220 (*)    All other components within normal limits  CBC WITH DIFFERENTIAL/PLATELET - Abnormal; Notable for the following components:   MCV 78.4 (*)    MCH 24.5 (*)    RDW 18.9 (*)    All other components within normal limits  COMPREHENSIVE METABOLIC PANEL - Abnormal; Notable for the following components:   Glucose, Bld 131 (*)    All other components within normal limits  HEMOGLOBIN A1C - Abnormal; Notable for the following components:   Hgb A1c MFr Bld 6.6 (*)    All  other components within normal limits  SURGICAL PCR SCREEN  TYPE AND SCREEN     IMAGES: NM Bone Scan 01/13/2022: IMPRESSION: 1. There is asymmetric increased uptake on all 3 phases localizing to the right knee, which in the appropriate clinical setting may reflect underlying arthroplasty device loosening or  infection. However, increased radiotracer uptake may be present for up to 2 years after arthroplasty. Clinical correlation is advised.  CTA Chest/abd/pelvis 09/15/2021: IMPRESSION: - There is no evidence of dissection in the thoracic and abdominal aorta. Major branches of thoracic and abdominal aorta are patent. Scattered atherosclerotic plaques and calcifications are seen in the aorta and its major branches. 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 Coronary artery calcifications are seen. There are no signs of pulmonary artery embolism. - COPD.  No focal pulmonary infiltrates are seen. -  There is no evidence of intestinal obstruction or pneumoperitoneum. There is no hydronephrosis. Appendix is not dilated. -  Fatty liver. - Other findings as described in the body of the report  1V PCXR 09/15/2021: FINDINGS: - Normal heart size and mediastinal contours. - Low volume chest. There is no edema, consolidation, effusion, or pneumothorax. Spinal fusion and spinal cord stimulator leads.  IMPRESSION: No evidence of active disease.    EKG: 09/15/2021: Sinus rhythm Probable left atrial enlargement Abnormal inferior Q waves Nonspecific T abnormalities, anterior leads slight decrease voltage throughout, otherwise similar to previous Confirmed by Charlesetta Shanks [phone]) on 09/15/2021 9:05:54 AM   CV: Echo 10/15/2018  1. The left ventricle has mildly reduced systolic function, with an  ejection fraction of 45-50%. The cavity size was normal. There is mildly  increased left ventricular wall thickness. Left ventricular diastolic  Doppler parameters are consistent with  impaired relaxation.   2. Abnormal septal motion inferior basal hypokinesis.   3. The right ventricle has normal systolic function. The cavity was  normal. There is no increase in right ventricular wall thickness.   4. Mild thickening of the mitral valve leaflet. Mild calcification of  the  mitral valve leaflet. There is mild mitral annular calcification present.   5. The aortic valve is tricuspid. Mild thickening of the aortic valve.  Sclerosis without any evidence of stenosis of the aortic valve.   6. The aorta is normal unless otherwise noted.   7. The interatrial septum was not well visualized.     Stress Test 10/12/2018 Nuclear stress EF: 42%. The left ventricular ejection fraction is moderately decreased (30-44%). There was no ST segment deviation noted during stress. Defect 1: There is a small defect of severe severity present in the basal inferior and mid inferior location. Findings consistent with prior Inferior wall myocardial infarction. This is an intermediate risk study based on reduction of EF . There is no evidence of ischemia.    Cardiac event monitor 03/17/2017 - 04/15/2017 Normal sinus rhythm No significant pauses or arrhythmia Normal monitor.  No significant arrhythmia noted.   Cardiac Cath 02/19/2017 Prox LAD to Mid LAD lesion is 20% stenosed. Prox Cx to Mid Cx lesion is 20% stenosed. Ost 1st Mrg to 1st Mrg lesion is 50% stenosed. Non-stenotic Prox RCA lesion previously treated. The left ventricular systolic function is normal. LV end diastolic pressure is normal. The left ventricular ejection fraction is 50-55% by visual estimate.   1. Nonobstructive CAD. Patent stent in the RCA.  2. Overall good LV function 3. Normal LVEDP    Past Medical History:  Diagnosis Date  Baker's cyst    Left calf   CAD in native artery, 07/15/15 PCI of RCA with DES 07/16/2015   a.   NSTEMI 5/17: LHC - pLAD 20, pLCx 20, OM1 30, pRCA 100, EF 45-50%>> PCI: 2.5 x 24 mm Promus DES to RCA  //  b.   Echo 5/17: mild LVH, EF 50-55%, no RWMA, mod RVE //  c. LHC 6/17: pLAD 20, pLCx 20, OM1 50, pRCA stent ok, EF 35-45% with mod sized inf wall and basal segment aneurysm   Chronic back pain    "down my back, down my legs" (02/18/2017)   Chronic combined systolic and  diastolic HF (heart failure) (HCC)    Constipation    Constipation due to opioid therapy    Fatty liver    GERD (gastroesophageal reflux disease)    04/07/2019- not current   Hyperlipidemia    Hypertension    Dr. [name] [phone]   Iliac artery aneurysm ([place])    1.5 cm left IIA 09/15/21 CTA; follow-up in ~ 18 months per vascular surgeon   Ischemic cardiomyopathy    a. LV-gram at time of LHC in 6/17 with EF 35-45%  //  b. 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"   Numbness and tingling of both lower extremities    "on the outside of both sides" (02/18/2017)   Rheumatoid arthritis (HCC)    RA   Tachycardia    Type II diabetes mellitus ([place])    diet controlled    Past Surgical History:  Procedure Laterality Date   BACK SURGERY     x3   bilateral cataract curgery      CARDIAC CATHETERIZATION N/A 07/15/2015   Procedure: Left Heart Cath and Coronary Angiography;  Surgeon: [name], MD;  Location: [facility];  Service: Cardiovascular;  Laterality: N/A;   CARDIAC CATHETERIZATION N/A 07/15/2015   Procedure: Coronary Stent Intervention;  Surgeon: [name], MD;  Location: [facility];  Service: Cardiovascular;  Laterality: N/A;   CARDIAC CATHETERIZATION N/A 08/07/2015   Procedure: Left Heart Cath and Coronary Angiography;  Surgeon: [name], MD;  Location: [facility];  Service: Cardiovascular;  Laterality: N/A;   CORONARY ANGIOPLASTY WITH STENT PLACEMENT  07/2015   LEFT HEART CATH AND CORONARY ANGIOGRAPHY N/A 02/19/2017   Procedure: LEFT HEART CATH AND CORONARY ANGIOGRAPHY;  Surgeon: Martinique, Peter M, MD;  Location: [place] [facility];  Service: Cardiovascular;  Laterality: N/A;   LUMBAR FUSION  04/11/2019   REVISION OF THORACOLUMBAR FUSION    LUMBAR SPINE SURGERY   03/2009  & 2012   MASS EXCISION N/A 04/19/2012   Procedure: removal of posterior cervical lipoma;  Surgeon: [name], MD;  Location: [place];  Service: Neurosurgery;  Laterality: N/A;  Removal of posterior cervical lipoma   POPLITEAL SYNOVIAL CYST EXCISION Left    "opened up behind my knee; scraped out arthritis"   POSTERIOR LUMBAR FUSION 4 LEVEL N/A 11/22/2018   Procedure: Posterior Lateral and Interbody fusion - Lumbar one-Lumbar two; explore fusion, posterior instrumentation and fusion Thoracic ten to the ilium;  Surgeon: Newman Pies, MD;  Location: [facility];  Service: Neurosurgery;  Laterality: N/A;   SPINAL CORD STIMULATOR INSERTION N/A 01/07/2013   Procedure:  SPINAL CORD STIMULATOR INSERTION;  Surgeon: Hall Busing  Maryjean Ka, MD;  Location: [facility];  Service: Neurosurgery;  Laterality: N/A;   SPINAL CORD STIMULATOR INSERTION N/A 02/09/2019   Procedure: REPLACEMENT OF LUMBAR SPINAL CORD STIMULATOR;  Surgeon: Newman Pies, MD;  Location: [facility];  Service: Neurosurgery;  Laterality: N/A;  Thoracic/Lumbar spine   TONSILLECTOMY     patient denies   TOTAL HIP ARTHROPLASTY Right 10/03/2016   Procedure: TOTAL HIP ARTHROPLASTY;  Surgeon: [name], MD;  Location: [facility];  Service: Orthopedics;  Laterality: Right;   TOTAL KNEE ARTHROPLASTY Right 11/09/2020   Procedure: TOTAL KNEE ARTHROPLASTY;  Surgeon: [name], MD;  Location: [facility];  Service: Orthopedics;  Laterality: Right;   WISDOM TOOTH EXTRACTION     hx of    MEDICATIONS:  ACCU-CHEK AVIVA PLUS test strip   Accu-Chek Softclix Lancets lancets   acetaminophen (TYLENOL) 500 MG tablet   aspirin 81 MG chewable tablet   atorvastatin (LIPITOR) 20 MG tablet   Buprenorphine HCl (BELBUCA) 900 MCG FILM   carvedilol (COREG) 12.5 MG tablet   carvedilol (COREG) 12.5 MG tablet   carvedilol (COREG) 12.5 MG tablet   chlorthalidone (HYGROTON) 25 MG tablet   chlorthalidone (HYGROTON) 25 MG tablet   Continuous Blood Gluc  Sensor (FREESTYLE LIBRE 2 SENSOR) MISC   cyclobenzaprine (FLEXERIL) 10 MG tablet   dapagliflozin propanediol (FARXIGA) 10 MG TABS tablet   dapagliflozin propanediol (FARXIGA) 10 MG TABS tablet   diclofenac Sodium (VOLTAREN) 1 % GEL   diltiazem (DILT-XR) 180 MG 24 hr capsule   docusate sodium (COLACE) 100 MG capsule   DULoxetine (CYMBALTA) 60 MG capsule   DULoxetine (CYMBALTA) 60 MG capsule   DULoxetine (CYMBALTA) 60 MG capsule   glipiZIDE (GLUCOTROL XL) 10 MG 24 hr tablet   glipiZIDE (GLUCOTROL XL) 10 MG 24 hr tablet   glipiZIDE (GLUCOTROL XL) 10 MG 24 hr tablet   glucose blood test strip   linaclotide (LINZESS) 145 MCG CAPS capsule   linaclotide (LINZESS) 145 MCG CAPS capsule   linaclotide (LINZESS) 145 MCG CAPS capsule   linaclotide (LINZESS) 145 MCG CAPS capsule   lisinopril (ZESTRIL) 20 MG tablet   lisinopril (ZESTRIL) 20 MG tablet   lisinopril (ZESTRIL) 20 MG tablet   lubiprostone (AMITIZA) 24 MCG capsule   lubiprostone (AMITIZA) 24 MCG capsule   lubiprostone (AMITIZA) 24 MCG capsule   lubiprostone (AMITIZA) 24 MCG capsule   metFORMIN (GLUCOPHAGE) 1000 MG tablet   metFORMIN (GLUCOPHAGE) 1000 MG tablet   methocarbamol (ROBAXIN) 750 MG tablet   methocarbamol (ROBAXIN) 750 MG tablet   Multiple Vitamin (MULTIVITAMIN WITH MINERALS) TABS tablet   naloxone (NARCAN) nasal spray 4 mg/0.1 mL   omeprazole (PRILOSEC) 40 MG capsule   omeprazole (PRILOSEC) 40 MG capsule   oxyCODONE (OXY IR/ROXICODONE) 5 MG immediate release tablet   oxyCODONE (OXY IR/ROXICODONE) 5 MG immediate release tablet   oxyCODONE (OXY IR/ROXICODONE) 5 MG immediate release tablet   pregabalin (LYRICA) 150 MG capsule   sildenafil (VIAGRA) 100 MG tablet   No current facility-administered medications for this encounter.   Instructed to hold Fargixa for 72 hours prior to surgery. Reported last ASA 03/23/22.    Myra Gianotti, PA-C Surgical Short Stay/Anesthesiology [facility] Phone [phone] [facility] Phone [phone] 03/25/2022 5:05 PM

## 2022-03-26 ENCOUNTER — Other Ambulatory Visit: Payer: Self-pay

## 2022-03-26 ENCOUNTER — Other Ambulatory Visit (HOSPITAL_COMMUNITY): Payer: Self-pay

## 2022-03-31 ENCOUNTER — Other Ambulatory Visit (HOSPITAL_COMMUNITY): Payer: Self-pay

## 2022-04-01 ENCOUNTER — Other Ambulatory Visit (HOSPITAL_COMMUNITY): Payer: Self-pay

## 2022-04-01 MED ORDER — TRANEXAMIC ACID 1000 MG/10ML IV SOLN
2000.0000 mg | INTRAVENOUS | Status: DC
  Filled 2022-04-01: qty 20

## 2022-04-02 ENCOUNTER — Inpatient Hospital Stay (HOSPITAL_COMMUNITY): Payer: 59 | Admitting: Vascular Surgery

## 2022-04-02 ENCOUNTER — Inpatient Hospital Stay (HOSPITAL_COMMUNITY): Payer: 59

## 2022-04-02 ENCOUNTER — Inpatient Hospital Stay (HOSPITAL_COMMUNITY)
Admission: RE | Admit: 2022-04-02 | Discharge: 2022-04-04 | DRG: 467 | Disposition: A | Discharge status: Home / Self Care | Payer: 59 | Attending: Orthopedic Surgery | Admitting: Orthopedic Surgery

## 2022-04-02 ENCOUNTER — Inpatient Hospital Stay (HOSPITAL_COMMUNITY): Payer: 59 | Admitting: Anesthesiology

## 2022-04-02 ENCOUNTER — Other Ambulatory Visit: Payer: Self-pay

## 2022-04-02 ENCOUNTER — Encounter (HOSPITAL_COMMUNITY)
Admission: RE | Disposition: A | Discharge status: Home / Self Care | Payer: Self-pay | Source: Home / Self Care | Attending: Orthopedic Surgery

## 2022-04-02 ENCOUNTER — Encounter (HOSPITAL_COMMUNITY): Payer: Self-pay | Admitting: Orthopedic Surgery

## 2022-04-02 DIAGNOSIS — Z9682 Presence of neurostimulator: Secondary | ICD-10-CM

## 2022-04-02 DIAGNOSIS — M1611 Unilateral primary osteoarthritis, right hip: Secondary | ICD-10-CM | POA: Diagnosis present

## 2022-04-02 DIAGNOSIS — I110 Hypertensive heart disease with heart failure: Secondary | ICD-10-CM | POA: Diagnosis present

## 2022-04-02 DIAGNOSIS — Z7982 Long term (current) use of aspirin: Secondary | ICD-10-CM | POA: Diagnosis not present

## 2022-04-02 DIAGNOSIS — Z8249 Family history of ischemic heart disease and other diseases of the circulatory system: Secondary | ICD-10-CM

## 2022-04-02 DIAGNOSIS — Z8042 Family history of malignant neoplasm of prostate: Secondary | ICD-10-CM | POA: Diagnosis not present

## 2022-04-02 DIAGNOSIS — K760 Fatty (change of) liver, not elsewhere classified: Secondary | ICD-10-CM | POA: Diagnosis present

## 2022-04-02 DIAGNOSIS — Z7984 Long term (current) use of oral hypoglycemic drugs: Secondary | ICD-10-CM

## 2022-04-02 DIAGNOSIS — T84019A Broken internal joint prosthesis, unspecified site, initial encounter: Principal | ICD-10-CM

## 2022-04-02 DIAGNOSIS — T84032A Mechanical loosening of internal right knee prosthetic joint, initial encounter: Secondary | ICD-10-CM | POA: Diagnosis present

## 2022-04-02 DIAGNOSIS — M069 Rheumatoid arthritis, unspecified: Secondary | ICD-10-CM | POA: Diagnosis present

## 2022-04-02 DIAGNOSIS — I5042 Chronic combined systolic (congestive) and diastolic (congestive) heart failure: Secondary | ICD-10-CM | POA: Diagnosis present

## 2022-04-02 DIAGNOSIS — Z79899 Other long term (current) drug therapy: Secondary | ICD-10-CM | POA: Diagnosis not present

## 2022-04-02 DIAGNOSIS — Z96641 Presence of right artificial hip joint: Secondary | ICD-10-CM | POA: Diagnosis present

## 2022-04-02 DIAGNOSIS — K219 Gastro-esophageal reflux disease without esophagitis: Secondary | ICD-10-CM | POA: Diagnosis present

## 2022-04-02 DIAGNOSIS — Z981 Arthrodesis status: Secondary | ICD-10-CM

## 2022-04-02 DIAGNOSIS — I10 Essential (primary) hypertension: Secondary | ICD-10-CM

## 2022-04-02 DIAGNOSIS — Z87891 Personal history of nicotine dependence: Secondary | ICD-10-CM | POA: Diagnosis not present

## 2022-04-02 DIAGNOSIS — I2510 Atherosclerotic heart disease of native coronary artery without angina pectoris: Secondary | ICD-10-CM | POA: Diagnosis present

## 2022-04-02 DIAGNOSIS — M1711 Unilateral primary osteoarthritis, right knee: Secondary | ICD-10-CM | POA: Diagnosis present

## 2022-04-02 DIAGNOSIS — T84022A Instability of internal right knee prosthesis, initial encounter: Secondary | ICD-10-CM | POA: Diagnosis present

## 2022-04-02 DIAGNOSIS — I252 Old myocardial infarction: Secondary | ICD-10-CM

## 2022-04-02 DIAGNOSIS — I255 Ischemic cardiomyopathy: Secondary | ICD-10-CM | POA: Diagnosis present

## 2022-04-02 DIAGNOSIS — E785 Hyperlipidemia, unspecified: Secondary | ICD-10-CM | POA: Diagnosis present

## 2022-04-02 DIAGNOSIS — E119 Type 2 diabetes mellitus without complications: Secondary | ICD-10-CM | POA: Diagnosis present

## 2022-04-02 DIAGNOSIS — Y831 Surgical operation with implant of artificial internal device as the cause of abnormal reaction of the patient, or of later complication, without mention of misadventure at the time of the procedure: Secondary | ICD-10-CM | POA: Diagnosis present

## 2022-04-02 HISTORY — PX: TOTAL KNEE REVISION: SHX996

## 2022-04-02 LAB — GLUCOSE, CAPILLARY
Glucose-Capillary: 85 mg/dL (ref 70–99)
Glucose-Capillary: 80 mg/dL (ref 70–99)
Glucose-Capillary: 174 mg/dL — ABNORMAL HIGH (ref 70–99)

## 2022-04-02 SURGERY — TOTAL KNEE REVISION
Anesthesia: Spinal | Site: Knee | Laterality: Right

## 2022-04-02 MED ORDER — PROPOFOL 1000 MG/100ML IV EMUL
INTRAVENOUS | Status: AC
  Filled 2022-04-02: qty 100

## 2022-04-02 MED ORDER — TRANEXAMIC ACID-NACL 1000-0.7 MG/100ML-% IV SOLN
1000.0000 mg | INTRAVENOUS | Status: AC
  Administered 2022-04-02: 1000 mg via INTRAVENOUS
  Filled 2022-04-02: qty 100

## 2022-04-02 MED ORDER — ACETAMINOPHEN 500 MG PO TABS
1000.0000 mg | ORAL_TABLET | Freq: Once | ORAL | Status: AC
  Administered 2022-04-02: 1000 mg via ORAL
  Filled 2022-04-02: qty 2

## 2022-04-02 MED ORDER — OXYCODONE HCL 5 MG PO TABS
15.0000 mg | ORAL_TABLET | ORAL | Status: DC | PRN
  Administered 2022-04-02 – 2022-04-03 (×3): 20 mg via ORAL
  Filled 2022-04-02 (×2): qty 4

## 2022-04-02 MED ORDER — ADULT MULTIVITAMIN W/MINERALS CH
1.0000 | ORAL_TABLET | Freq: Every day | ORAL | Status: DC
  Administered 2022-04-03 – 2022-04-04 (×2): 1 via ORAL
  Filled 2022-04-02 (×2): qty 1

## 2022-04-02 MED ORDER — HYDROMORPHONE HCL 1 MG/ML IJ SOLN
INTRAMUSCULAR | Status: AC
  Administered 2022-04-02: 0.5 mg via INTRAVENOUS
  Filled 2022-04-02: qty 1

## 2022-04-02 MED ORDER — ORAL CARE MOUTH RINSE: 15.0000 mL | Freq: Once | OROMUCOSAL | Status: AC

## 2022-04-02 MED ORDER — POLYETHYLENE GLYCOL 3350 17 G PO PACK: 17.0000 g | PACK | Freq: Every day | ORAL | Status: DC | PRN

## 2022-04-02 MED ORDER — OXYCODONE HCL 5 MG/5ML PO SOLN: 5.0000 mg | Freq: Once | ORAL | Status: AC | PRN

## 2022-04-02 MED ORDER — PANTOPRAZOLE SODIUM 40 MG PO TBEC
40.0000 mg | DELAYED_RELEASE_TABLET | Freq: Every day | ORAL | Status: DC
  Administered 2022-04-02 – 2022-04-04 (×3): 40 mg via ORAL
  Filled 2022-04-02 (×3): qty 1

## 2022-04-02 MED ORDER — ZOLPIDEM TARTRATE 5 MG PO TABS: 5.0000 mg | ORAL_TABLET | Freq: Every evening | ORAL | Status: DC | PRN

## 2022-04-02 MED ORDER — GLIPIZIDE ER 10 MG PO TB24
20.0000 mg | ORAL_TABLET | Freq: Every day | ORAL | Status: DC
  Administered 2022-04-03 – 2022-04-04 (×2): 20 mg via ORAL
  Filled 2022-04-02 (×3): qty 2

## 2022-04-02 MED ORDER — METFORMIN HCL 500 MG PO TABS
1000.0000 mg | ORAL_TABLET | Freq: Two times a day (BID) | ORAL | Status: DC
  Administered 2022-04-02 – 2022-04-04 (×4): 1000 mg via ORAL
  Filled 2022-04-02 (×4): qty 2

## 2022-04-02 MED ORDER — METHOCARBAMOL 500 MG PO TABS
ORAL_TABLET | ORAL | Status: AC
  Administered 2022-04-02: 500 mg via ORAL
  Filled 2022-04-02: qty 1

## 2022-04-02 MED ORDER — CARVEDILOL 12.5 MG PO TABS
12.5000 mg | ORAL_TABLET | Freq: Two times a day (BID) | ORAL | Status: DC
  Administered 2022-04-03 – 2022-04-04 (×3): 12.5 mg via ORAL
  Filled 2022-04-02 (×3): qty 1

## 2022-04-02 MED ORDER — WATER FOR IRRIGATION, STERILE IR SOLN
Status: DC | PRN
  Administered 2022-04-02: 1000 mL

## 2022-04-02 MED ORDER — PROPOFOL 10 MG/ML IV BOLUS
INTRAVENOUS | Status: AC
  Filled 2022-04-02: qty 20

## 2022-04-02 MED ORDER — METHOCARBAMOL 500 MG PO TABS
500.0000 mg | ORAL_TABLET | Freq: Four times a day (QID) | ORAL | Status: DC | PRN
  Administered 2022-04-03 – 2022-04-04 (×5): 500 mg via ORAL
  Filled 2022-04-02 (×5): qty 1

## 2022-04-02 MED ORDER — HYDROMORPHONE HCL 1 MG/ML IJ SOLN
0.2500 mg | INTRAMUSCULAR | Status: DC | PRN
  Administered 2022-04-02 (×2): 0.5 mg via INTRAVENOUS

## 2022-04-02 MED ORDER — DOCUSATE SODIUM 100 MG PO CAPS: 300.0000 mg | ORAL_CAPSULE | Freq: Two times a day (BID) | ORAL | Status: DC

## 2022-04-02 MED ORDER — PREGABALIN 75 MG PO CAPS
150.0000 mg | ORAL_CAPSULE | Freq: Two times a day (BID) | ORAL | Status: DC
  Administered 2022-04-02 – 2022-04-04 (×4): 150 mg via ORAL
  Filled 2022-04-02 (×4): qty 2

## 2022-04-02 MED ORDER — MIDAZOLAM HCL 2 MG/2ML IJ SOLN: 2.0000 mg | Freq: Once | INTRAMUSCULAR | Status: AC

## 2022-04-02 MED ORDER — MIDAZOLAM HCL 2 MG/2ML IJ SOLN: 0.5000 mg | Freq: Once | INTRAMUSCULAR | Status: DC | PRN

## 2022-04-02 MED ORDER — FENTANYL CITRATE (PF) 100 MCG/2ML IJ SOLN
INTRAMUSCULAR | Status: AC
  Administered 2022-04-02: 100 ug via INTRAVENOUS
  Filled 2022-04-02: qty 2

## 2022-04-02 MED ORDER — ACETAMINOPHEN 500 MG PO TABS
1000.0000 mg | ORAL_TABLET | Freq: Four times a day (QID) | ORAL | Status: DC
  Administered 2022-04-02 – 2022-04-04 (×6): 1000 mg via ORAL
  Filled 2022-04-02 (×6): qty 2

## 2022-04-02 MED ORDER — OXYCODONE HCL 5 MG PO TABS
5.0000 mg | ORAL_TABLET | Freq: Once | ORAL | Status: AC | PRN
  Administered 2022-04-02: 5 mg via ORAL

## 2022-04-02 MED ORDER — LISINOPRIL 20 MG PO TABS
20.0000 mg | ORAL_TABLET | Freq: Every day | ORAL | Status: DC
  Administered 2022-04-03 – 2022-04-04 (×2): 20 mg via ORAL
  Filled 2022-04-02 (×2): qty 1

## 2022-04-02 MED ORDER — DULOXETINE HCL 60 MG PO CPEP
60.0000 mg | ORAL_CAPSULE | Freq: Every day | ORAL | Status: DC
  Administered 2022-04-03 – 2022-04-04 (×2): 60 mg via ORAL
  Filled 2022-04-02 (×2): qty 1

## 2022-04-02 MED ORDER — BUPIVACAINE LIPOSOME 1.3 % IJ SUSP
INTRAMUSCULAR | Status: DC | PRN
  Administered 2022-04-02: 20 mL

## 2022-04-02 MED ORDER — ATORVASTATIN CALCIUM 10 MG PO TABS
20.0000 mg | ORAL_TABLET | Freq: Every day | ORAL | Status: DC
  Administered 2022-04-02 – 2022-04-03 (×2): 20 mg via ORAL
  Filled 2022-04-02 (×2): qty 2

## 2022-04-02 MED ORDER — ONDANSETRON HCL 4 MG/2ML IJ SOLN
INTRAMUSCULAR | Status: DC | PRN
  Administered 2022-04-02: 4 mg via INTRAVENOUS

## 2022-04-02 MED ORDER — ACETAMINOPHEN 325 MG PO TABS
325.0000 mg | ORAL_TABLET | Freq: Four times a day (QID) | ORAL | Status: DC | PRN
  Administered 2022-04-03: 650 mg via ORAL
  Filled 2022-04-02: qty 2

## 2022-04-02 MED ORDER — BUPIVACAINE IN DEXTROSE 0.75-8.25 % IT SOLN
INTRATHECAL | Status: DC | PRN
  Administered 2022-04-02: 15 mg via INTRATHECAL

## 2022-04-02 MED ORDER — SURGIPHOR WOUND IRRIGATION SYSTEM - OPTIME: TOPICAL | Status: DC | PRN

## 2022-04-02 MED ORDER — ONDANSETRON HCL 4 MG/2ML IJ SOLN
INTRAMUSCULAR | Status: AC
  Filled 2022-04-02: qty 2

## 2022-04-02 MED ORDER — DIPHENHYDRAMINE HCL 12.5 MG/5ML PO ELIX: 12.5000 mg | ORAL_SOLUTION | ORAL | Status: DC | PRN

## 2022-04-02 MED ORDER — CEFAZOLIN SODIUM-DEXTROSE 2-4 GM/100ML-% IV SOLN
2.0000 g | INTRAVENOUS | Status: AC
  Administered 2022-04-02: 2 g via INTRAVENOUS
  Filled 2022-04-02: qty 100

## 2022-04-02 MED ORDER — CHLORTHALIDONE 25 MG PO TABS
25.0000 mg | ORAL_TABLET | Freq: Every day | ORAL | Status: DC
  Administered 2022-04-03 – 2022-04-04 (×2): 25 mg via ORAL
  Filled 2022-04-02 (×3): qty 1

## 2022-04-02 MED ORDER — PROMETHAZINE HCL 25 MG/ML IJ SOLN: 6.2500 mg | INTRAMUSCULAR | Status: DC | PRN

## 2022-04-02 MED ORDER — MENTHOL 3 MG MT LOZG: 1.0000 | LOZENGE | OROMUCOSAL | Status: DC | PRN

## 2022-04-02 MED ORDER — BUPIVACAINE LIPOSOME 1.3 % IJ SUSP
INTRAMUSCULAR | Status: AC
  Filled 2022-04-02: qty 20

## 2022-04-02 MED ORDER — ONDANSETRON HCL 4 MG/2ML IJ SOLN: 4.0000 mg | Freq: Four times a day (QID) | INTRAMUSCULAR | Status: DC | PRN

## 2022-04-02 MED ORDER — ASPIRIN 81 MG PO CHEW
81.0000 mg | CHEWABLE_TABLET | Freq: Two times a day (BID) | ORAL | Status: DC
  Administered 2022-04-02 – 2022-04-04 (×4): 81 mg via ORAL
  Filled 2022-04-02 (×4): qty 1

## 2022-04-02 MED ORDER — PHENOL 1.4 % MT LIQD: 1.0000 | OROMUCOSAL | Status: DC | PRN

## 2022-04-02 MED ORDER — INSULIN ASPART 100 UNIT/ML IJ SOLN: 0.0000 [IU] | Freq: Every day | INTRAMUSCULAR | Status: DC

## 2022-04-02 MED ORDER — CEFAZOLIN SODIUM-DEXTROSE 2-4 GM/100ML-% IV SOLN
2.0000 g | Freq: Three times a day (TID) | INTRAVENOUS | Status: DC
  Administered 2022-04-02 – 2022-04-04 (×5): 2 g via INTRAVENOUS
  Filled 2022-04-02 (×5): qty 100

## 2022-04-02 MED ORDER — DAPAGLIFLOZIN PROPANEDIOL 10 MG PO TABS
10.0000 mg | ORAL_TABLET | Freq: Every day | ORAL | Status: DC
  Administered 2022-04-03 – 2022-04-04 (×2): 10 mg via ORAL
  Filled 2022-04-02 (×2): qty 1

## 2022-04-02 MED ORDER — ONDANSETRON HCL 4 MG PO TABS: 4.0000 mg | ORAL_TABLET | Freq: Four times a day (QID) | ORAL | Status: DC | PRN

## 2022-04-02 MED ORDER — VANCOMYCIN HCL 1000 MG IV SOLR
INTRAVENOUS | Status: AC
  Filled 2022-04-02: qty 20

## 2022-04-02 MED ORDER — SODIUM CHLORIDE 0.9 % IR SOLN
Status: DC | PRN
  Administered 2022-04-02: 3000 mL

## 2022-04-02 MED ORDER — MIDAZOLAM HCL 2 MG/2ML IJ SOLN
INTRAMUSCULAR | Status: AC
  Administered 2022-04-02: 2 mg via INTRAVENOUS
  Filled 2022-04-02: qty 2

## 2022-04-02 MED ORDER — FENTANYL CITRATE (PF) 100 MCG/2ML IJ SOLN: 100.0000 ug | Freq: Once | INTRAMUSCULAR | Status: AC

## 2022-04-02 MED ORDER — POVIDONE-IODINE 10 % EX SWAB
2.0000 | Freq: Once | CUTANEOUS | Status: AC
  Administered 2022-04-02: 2 via TOPICAL

## 2022-04-02 MED ORDER — SODIUM CHLORIDE 0.9% FLUSH
INTRAVENOUS | Status: DC | PRN
  Administered 2022-04-02: 60 mL

## 2022-04-02 MED ORDER — PROPOFOL 10 MG/ML IV BOLUS
INTRAVENOUS | Status: DC | PRN
  Administered 2022-04-02: 10 mg via INTRAVENOUS

## 2022-04-02 MED ORDER — ROPIVACAINE HCL 7.5 MG/ML IJ SOLN
INTRAMUSCULAR | Status: DC | PRN
  Administered 2022-04-02: 20 mL via PERINEURAL

## 2022-04-02 MED ORDER — INSULIN ASPART 100 UNIT/ML IJ SOLN
0.0000 [IU] | Freq: Three times a day (TID) | INTRAMUSCULAR | Status: DC
  Administered 2022-04-03: 2 [IU] via SUBCUTANEOUS
  Administered 2022-04-03 (×2): 3 [IU] via SUBCUTANEOUS
  Administered 2022-04-04 (×2): 2 [IU] via SUBCUTANEOUS

## 2022-04-02 MED ORDER — MEPERIDINE HCL 25 MG/ML IJ SOLN: 6.2500 mg | INTRAMUSCULAR | Status: DC | PRN

## 2022-04-02 MED ORDER — DOCUSATE SODIUM 100 MG PO CAPS
100.0000 mg | ORAL_CAPSULE | Freq: Two times a day (BID) | ORAL | Status: DC
  Administered 2022-04-02 – 2022-04-04 (×4): 100 mg via ORAL
  Filled 2022-04-02 (×4): qty 1

## 2022-04-02 MED ORDER — OXYCODONE HCL 5 MG PO TABS
ORAL_TABLET | ORAL | Status: AC
  Filled 2022-04-02: qty 1

## 2022-04-02 MED ORDER — BUPRENORPHINE HCL 900 MCG BU FILM: 1.0000 | ORAL_FILM | Freq: Two times a day (BID) | BUCCAL | Status: DC

## 2022-04-02 MED ORDER — LACTATED RINGERS IV SOLN: INTRAVENOUS | Status: DC

## 2022-04-02 MED ORDER — HYDROMORPHONE HCL 1 MG/ML IJ SOLN
0.5000 mg | INTRAMUSCULAR | Status: DC | PRN
  Administered 2022-04-02 – 2022-04-04 (×10): 1 mg via INTRAVENOUS
  Filled 2022-04-02 (×11): qty 1

## 2022-04-02 MED ORDER — CHLORHEXIDINE GLUCONATE 0.12 % MT SOLN
15.0000 mL | Freq: Once | OROMUCOSAL | Status: AC
  Administered 2022-04-02: 15 mL via OROMUCOSAL
  Filled 2022-04-02: qty 15

## 2022-04-02 MED ORDER — BUPIVACAINE LIPOSOME 1.3 % IJ SUSP
20.0000 mL | Freq: Once | INTRAMUSCULAR | Status: DC
  Filled 2022-04-02: qty 10

## 2022-04-02 MED ORDER — SODIUM CHLORIDE 0.9 % IV SOLN: INTRAVENOUS | Status: DC

## 2022-04-02 MED ORDER — PROPOFOL 500 MG/50ML IV EMUL
INTRAVENOUS | Status: DC | PRN
  Administered 2022-04-02: 150 ug/kg/min via INTRAVENOUS

## 2022-04-02 MED ORDER — OXYCODONE HCL 5 MG PO TABS
10.0000 mg | ORAL_TABLET | ORAL | Status: DC | PRN
  Administered 2022-04-02: 15 mg via ORAL
  Filled 2022-04-02: qty 3
  Filled 2022-04-02: qty 2
  Filled 2022-04-02: qty 3

## 2022-04-02 MED ORDER — PHENYLEPHRINE HCL-NACL 20-0.9 MG/250ML-% IV SOLN
INTRAVENOUS | Status: DC | PRN
  Administered 2022-04-02: 20 ug/min via INTRAVENOUS

## 2022-04-02 MED ORDER — METHOCARBAMOL 1000 MG/10ML IJ SOLN: 500.0000 mg | Freq: Four times a day (QID) | INTRAVENOUS | Status: DC | PRN

## 2022-04-02 MED ORDER — DILTIAZEM HCL ER COATED BEADS 180 MG PO CP24
180.0000 mg | ORAL_CAPSULE | Freq: Every day | ORAL | Status: DC
  Administered 2022-04-03 – 2022-04-04 (×2): 180 mg via ORAL
  Filled 2022-04-02 (×2): qty 1

## 2022-04-02 MED ORDER — LINACLOTIDE 145 MCG PO CAPS
145.0000 ug | ORAL_CAPSULE | Freq: Every day | ORAL | Status: DC
  Administered 2022-04-03 – 2022-04-04 (×2): 145 ug via ORAL
  Filled 2022-04-02 (×3): qty 1

## 2022-04-02 MED ORDER — 0.9 % SODIUM CHLORIDE (POUR BTL) OPTIME
TOPICAL | Status: DC | PRN
  Administered 2022-04-02: 1000 mL

## 2022-04-02 SURGICAL SUPPLY — 88 items
ADH SKN CLS APL DERMABOND .7 (GAUZE/BANDAGES/DRESSINGS)
APL PRP STRL LF DISP 70% ISPRP (MISCELLANEOUS) ×2
AUG FEM 9 9+ 5 STRL LF KN POST (Miscellaneous) ×2 IMPLANT
AUG TIB SM CONE CNTR STRL LF (Miscellaneous) ×1 IMPLANT
BAG COUNTER SPONGE SURGICOUNT (BAG) IMPLANT
BAG SPNG CNTER NS LX DISP (BAG) ×1
BANDAGE ESMARK 6X9 LF (GAUZE/BANDAGES/DRESSINGS) IMPLANT
BLADE AVERAGE 25X9 (BLADE) IMPLANT
BLADE HEX COATED 2.75 (ELECTRODE) ×1 IMPLANT
BLADE SAG 18X100X1.27 (BLADE) ×1 IMPLANT
BLADE SAW SAG 35X64 .89 (BLADE) ×1 IMPLANT
BNDG CMPR 9X6 STRL LF SNTH (GAUZE/BANDAGES/DRESSINGS) ×1
BNDG CMPR MED 10X6 ELC LF (GAUZE/BANDAGES/DRESSINGS) ×1
BNDG ELASTIC 6X10 VLCR STRL LF (GAUZE/BANDAGES/DRESSINGS) ×1 IMPLANT
BNDG ESMARK 6X9 LF (GAUZE/BANDAGES/DRESSINGS) ×1
BOWL SMART MIX CTS (DISPOSABLE) ×1 IMPLANT
CANISTER WOUND CARE 500ML ATS (WOUND CARE) IMPLANT
CEMENT BONE REFOBACIN R1X40 US (Cement) IMPLANT
CHLORAPREP W/TINT 26 (MISCELLANEOUS) ×2 IMPLANT
CNTNR URN SCR LID CUP LEK RST (MISCELLANEOUS) IMPLANT
COMPONENT TIB PSN REV RT SZG (Miscellaneous) ×1 IMPLANT
COMPONET TIB PSN REV RT SZG (Miscellaneous) IMPLANT
CONT SPEC 4OZ STRL OR WHT (MISCELLANEOUS) ×4
COOLER ICEMAN CLASSIC (MISCELLANEOUS) IMPLANT
COVER SURGICAL LIGHT HANDLE (MISCELLANEOUS) ×1 IMPLANT
CUFF TOURN SGL QUICK 34 (TOURNIQUET CUFF) ×1
CUFF TRNQT CYL 34X4.125X (TOURNIQUET CUFF) ×1 IMPLANT
DERMABOND ADVANCED .7 DNX12 (GAUZE/BANDAGES/DRESSINGS) ×1 IMPLANT
DRAPE EXTREMITY T 121X128X90 (DISPOSABLE) IMPLANT
DRAPE INCISE IOBAN 66X45 STRL (DRAPES) IMPLANT
DRAPE INCISE IOBAN 85X60 (DRAPES) ×1 IMPLANT
DRAPE POUCH INSTRU U-SHP 10X18 (DRAPES) IMPLANT
DRAPE SHEET LG 3/4 BI-LAMINATE (DRAPES) ×1 IMPLANT
DRAPE U-SHAPE 47X51 STRL (DRAPES) ×1 IMPLANT
DRESSING AQUACEL AG SP 3.5X10 (GAUZE/BANDAGES/DRESSINGS) ×1 IMPLANT
DRESSING PEEL AND PLAC PRVNA20 (GAUZE/BANDAGES/DRESSINGS) IMPLANT
DRSG AQUACEL AG SP 3.5X10 (GAUZE/BANDAGES/DRESSINGS)
DRSG PEEL AND PLACE PREVENA 20 (GAUZE/BANDAGES/DRESSINGS) ×1
GLOVE BIO SURGEON STRL SZ7.5 (GLOVE) ×2 IMPLANT
GLOVE BIOGEL PI IND STRL 6.5 (GLOVE) IMPLANT
GLOVE BIOGEL PI IND STRL 7.5 (GLOVE) ×1 IMPLANT
GLOVE BIOGEL PI IND STRL 8 (GLOVE) ×1 IMPLANT
GLOVE BIOGEL PI MICRO STRL 6 (GLOVE) IMPLANT
GLOVE SURG ENC MOIS LTX SZ8 (GLOVE) ×2 IMPLANT
GOWN STRL REUS W/TWL XL LVL3 (GOWN DISPOSABLE) ×1 IMPLANT
HANDPIECE INTERPULSE COAX TIP (DISPOSABLE) ×1
HDLS TROCR DRIL PIN KNEE 75 (PIN) ×1
HOLDER FOLEY CATH W/STRAP (MISCELLANEOUS) IMPLANT
HOOD PEEL AWAY FLYTE STAYCOOL (MISCELLANEOUS) ×3 IMPLANT
INSTR SCRW HEX REV FIX 3.5X48 (ORTHOPEDIC DISPOSABLE SUPPLIES) ×1
INSTRUMENT SCRW HEX REV 3.5X48 (ORTHOPEDIC DISPOSABLE SUPPLIES) IMPLANT
LINER TIB PS KNEE GH/6-9 16 RT (Liner) IMPLANT
MANIFOLD NEPTUNE II (INSTRUMENTS) ×1 IMPLANT
MARKER SKIN DUAL TIP RULER LAB (MISCELLANEOUS) ×2 IMPLANT
NDL 18GX1X1/2 (RX/OR ONLY) (NEEDLE) IMPLANT
NEEDLE 18GX1X1/2 (RX/OR ONLY) (NEEDLE) ×1 IMPLANT
NS IRRIG 1000ML POUR BTL (IV SOLUTION) ×1 IMPLANT
PACK ICE MAXI GEL EZY WRAP (MISCELLANEOUS) IMPLANT
PACK TOTAL KNEE CUSTOM (KITS) ×1 IMPLANT
PIN DRILL HDLS TROCAR 75 4PK (PIN) IMPLANT
PROSTHESIS FEM KNEE UNCOATED (Knees) IMPLANT
PROTECTOR NERVE ULNAR (MISCELLANEOUS) ×1 IMPLANT
SCREW HEX HEADED 3.5X27 DISP (ORTHOPEDIC DISPOSABLE SUPPLIES) IMPLANT
SET HNDPC FAN SPRY TIP SCT (DISPOSABLE) ×1 IMPLANT
SOLUTION IRRIG SURGIPHOR (IV SOLUTION) ×1 IMPLANT
SPIKE FLUID TRANSFER (MISCELLANEOUS) ×1 IMPLANT
SPONGE T-LAP 18X18 ~~LOC~~+RFID (SPONGE) ×3 IMPLANT
STEM FEM OFFSET 6 EXT 16X135 (Miscellaneous) IMPLANT
STEM STRT REV PS KNEE 20X135 (Stem) IMPLANT
SUT ETHILON 3 0 FSL (SUTURE) IMPLANT
SUT ETHILON 3 0 PS 1 (SUTURE) ×4 IMPLANT
SUT MNCRL AB 3-0 PS2 18 (SUTURE) ×1 IMPLANT
SUT STRATAFIX 0 PDS 27 VIOLET (SUTURE) ×1
SUT STRATAFIX 1PDS 45CM VIOLET (SUTURE) ×1 IMPLANT
SUT STRATAFIX PDO 1 14 VIOLET (SUTURE) ×1
SUT STRATFX PDO 1 14 VIOLET (SUTURE) ×1
SUT VIC AB 1 CT1 27 (SUTURE) ×1
SUT VIC AB 1 CT1 27XBRD ANBCTR (SUTURE) IMPLANT
SUT VIC AB 2-0 CT2 27 (SUTURE) ×2 IMPLANT
SUTURE STRATFX 0 PDS 27 VIOLET (SUTURE) ×1 IMPLANT
SUTURE STRATFX PDO 1 14 VIOLET (SUTURE) ×1 IMPLANT
SYR 50ML LL SCALE MARK (SYRINGE) ×1 IMPLANT
TRAY FOLEY MTR SLVR 14FR STAT (SET/KITS/TRAYS/PACK) IMPLANT
TRAY FOLEY MTR SLVR 16FR STAT (SET/KITS/TRAYS/PACK) IMPLANT
TUBE SUCTION HIGH CAP CLEAR NV (SUCTIONS) ×1 IMPLANT
WEDGE FEM F/ARTHRO 9X5 (Miscellaneous) IMPLANT
WEDGE FEM F/ARTHRO SMALL (Miscellaneous) IMPLANT
WRAP KNEE MAXI GEL POST OP (GAUZE/BANDAGES/DRESSINGS) IMPLANT

## 2022-04-02 NOTE — Anesthesia Postprocedure Evaluation (Signed)
Anesthesia Post Note  Patient: Thomas Mcclure  Procedure(s) Performed: TOTAL KNEE REVISION (Right: Knee)     Patient location during evaluation: PACU Anesthesia Type: Spinal Level of consciousness: awake and alert, patient cooperative and oriented Pain management: pain level controlled Vital Signs Assessment: post-procedure vital signs reviewed and stable Respiratory status: nonlabored ventilation, respiratory function stable and spontaneous breathing Cardiovascular status: blood pressure returned to baseline and stable Postop Assessment: spinal receding, patient able to bend at knees and no apparent nausea or vomiting Anesthetic complications: no  No notable events documented.  Last Vitals:  Vitals:   04/02/22 1700 04/02/22 1715  BP: 108/74 110/85  Pulse: 63 63  Resp: 20 14  Temp:    SpO2: 100% 97%    Last Pain:  Vitals:   04/02/22 1715  PainSc: 8                  [name],E. Daymeon [name]

## 2022-04-02 NOTE — Anesthesia Preprocedure Evaluation (Addendum)
Anesthesia Evaluation  Patient identified by MRN, date of birth, ID band Patient awake    Reviewed: Allergy & Precautions, NPO status , Patient's Chart, lab work & pertinent test results, reviewed documented beta blocker date and time   History of Anesthesia Complications Negative for: history of anesthetic complications  Airway Mallampati: I  TM Distance: >3 FB Neck ROM: Full    Dental  (+) Edentulous Upper, Edentulous Lower   Pulmonary former smoker   breath sounds clear to auscultation       Cardiovascular hypertension, Pt. on medications and Pt. on home beta blockers (-) angina + CAD, + Past MI, + Cardiac Stents and + Peripheral Vascular Disease   Rhythm:Regular Rate:Normal  '20 ECHO: EF 45-50%. The cavity size was normal. There is mildly  increased left ventricular wall thickness. Left ventricular diastolic  Doppler parameters are consistent with impaired relaxation.   2. Abnormal septal motion inferior basal hypokinesis.   3. The RV has normal systolic function. The cavity was normal. There is no increase in right ventricular wall thickness.   4. Mild thickening of the mitral valve leaflet. Mild calcification of the mitral valve leaflet. There is mild mitral annular calcification present.   5. The aortic valve is tricuspid. Mild thickening of the aortic valve. Sclerosis without any evidence of stenosis of the aortic valve.    '20 Stress:  EF: 42%. The left ventricular EF is moderately decreased (30-44%). There was no ST segment deviation noted during stress. Defect 1: There is a small defect of severe severity present in the basal inferior and mid inferior location. Findings consistent with prior Inferior wall myocardial infarction. This is an intermediate risk study based on reduction of EF . There is no evidence of ischemia.    Neuro/Psych Chronic back pain    GI/Hepatic Neg liver ROS,GERD  Medicated and  Controlled,,  Endo/Other  diabetes (Glu 85), Oral Hypoglycemic Agents  BMI 35.44  Renal/GU negative Renal ROS     Musculoskeletal  (+) Arthritis ,    Abdominal  (+) + obese  Peds  Hematology negative hematology ROS (+)   Anesthesia Other Findings   Reproductive/Obstetrics                             Anesthesia Physical Anesthesia Plan  ASA: 3  Anesthesia Plan: Spinal   Post-op Pain Management: Regional block* and Tylenol PO (pre-op)*   Induction:   PONV Risk Score and Plan: 1 and Ondansetron  Airway Management Planned: Natural Airway and Simple Face Mask  Additional Equipment: None  Intra-op Plan:   Post-operative Plan:   Informed Consent: I have reviewed the patients History and Physical, chart, labs and discussed the procedure including the risks, benefits and alternatives for the proposed anesthesia with the patient or authorized representative who has indicated his/her understanding and acceptance.       Plan Discussed with: CRNA and Surgeon  Anesthesia Plan Comments: (Plan routine monitors, SAB with adductor canal block for post op analgesia)       Anesthesia Quick Evaluation

## 2022-04-02 NOTE — Progress Notes (Signed)
Orthopedic Tech Progress Note Patient Details:  Thomas Mcclure 08/28/1955 [id number]  Ortho Devices Type of Ortho Device: Bone foam zero knee Ortho Device/Splint Interventions: Ordered   Post Interventions Instructions Provided: Adjustment of device, Care of device  [name] 04/02/2022, 10:26 PM

## 2022-04-02 NOTE — Discharge Instructions (Signed)

## 2022-04-02 NOTE — Anesthesia Procedure Notes (Signed)
Spinal  Patient location during procedure: OR End time: 04/02/2022 12:36 PM Reason for block: surgical anesthesia Staffing Performed: anesthesiologist  Anesthesiologist: Annye Asa, MD Performed by: Annye Asa, MD Authorized by: Annye Asa, MD   Preanesthetic Checklist Completed: patient identified, IV checked, site marked, risks and benefits discussed, surgical consent, monitors and equipment checked, pre-op evaluation and timeout performed Spinal Block Patient position: sitting Prep: DuraPrep and site prepped and draped Patient monitoring: blood pressure, continuous pulse ox, cardiac monitor and heart rate Approach: midline Location: L3-4 Needle Needle type: Pencan and Introducer  Needle gauge: 24 G Needle length: 9 cm Assessment Events: CSF return Additional Notes Pt identified in Operating room.  Monitors applied. Working IV access confirmed. Sterile prep, drape lumbar spine.  1% lido local L 3,4.  #24ga Pencan into clear CSF L 3,4.  '15mg'$  0.75% Bupivacaine with dextrose injected with asp CSF beginning and end of injection.  Patient asymptomatic, VSS, no heme aspirated, tolerated well.  Jenita Seashore, MD

## 2022-04-02 NOTE — Op Note (Signed)
DATE OF SURGERY:  04/02/2022 TIME: 4:01 PM  PATIENT NAME:  Thomas Mcclure   AGE: 67 y.o.    PRE-OPERATIVE DIAGNOSIS: Aseptic loosening right total knee arthroplasty  POST-OPERATIVE DIAGNOSIS: Instability right total knee arthroplasty with aseptic loosening  PROCEDURE: Revision right total Knee Arthroplasty  SURGEON:  Tylisa Alcivar A Mannix Kroeker, MD   ASSISTANT: Izola Price, RNFA, present and scrubbed throughout the case, critical for assistance with exposure, retraction, instrumentation, and closure.   OPERATIVE IMPLANTS:  Cemented Zimmer persona size G revision tibial baseplate with 625 mm x 16 mm tibial stem splined with 6 mm offset, small trabecular metal central cone, persona revision femur cemented size 9 with 5 mm posterior medial and posterior lateral augments size 135 mm x 20 mm straight spline stem, Vivacit-E size 16 right CPS polyethylene liner Implant Name Type Inv. Item Serial No. Manufacturer Lot No. LRB No. Used Action  CEMENT BONE REFOBACIN [id number] [place] - [id number] Cement CEMENT BONE REFOBACIN [id number] [place]  ZIMMER RECON(ORTH,TRAU,BIO,SG) [id number] Right 2 Implanted  WEDGE FEM F/ARTHRO SMALL - [id number] Miscellaneous WEDGE FEM F/ARTHRO SMALL  ZIMMER RECON(ORTH,TRAU,BIO,SG) [id number] Right 1 Implanted  COMPONENT TIB PSN REV RT SZG - [id number] Miscellaneous COMPONENT TIB PSN REV RT SZG  ZIMMER RECON(ORTH,TRAU,BIO,SG) [id number] Right 1 Implanted  STEM FEM OFFSET 6 EXT [id number] - [id number] Miscellaneous STEM FEM OFFSET 6 EXT [id number]  ZIMMER RECON(ORTH,TRAU,BIO,SG) [id number] Right 1 Implanted  WEDGE FEM F/ARTHRO 9X5 - [id number] Miscellaneous WEDGE FEM F/ARTHRO 9X5  ZIMMER RECON(ORTH,TRAU,BIO,SG) [id number] Right 1 Implanted  WEDGE FEM F/ARTHRO 9X5 - [id number] Miscellaneous WEDGE FEM F/ARTHRO 9X5  ZIMMER RECON(ORTH,TRAU,BIO,SG) [id number] Right 1 Implanted  PROSTHESIS FEM KNEE UNCOATED - [id number] Knees PROSTHESIS FEM KNEE UNCOATED  ZIMMER RECON(ORTH,TRAU,BIO,SG) [id number] Right 1 Implanted  Persona  Revision Stem Extension Straight Splined    ZIMMER [id number] Right 1 Implanted  Persona The Personalized Knee System Vivacit-E Highly Crosslinked Polyethylene    ZIMMER [id number] Right 1 Implanted     PREOPERATIVE INDICATIONS:  Thomas Mcclure is a 67 y.o. year old male who underwent right total arthroplasty Dr. French Ana 11/09/2020.  Postoperatively he had significant issues with instability and buckling.  The pain is severely functionally limiting.  He is unable to live with the knee as it was.  Preoperative workup including aspiration was negative for infection.  Bone scan was concerning for possible aseptic loosening given asymmetric increased uptake along the distal femur and proximal tibial components.  There was also notably development of an anterior and lateral osteophytes around the tibial baseplate concerning for possible loosening.  The risks, benefits, and alternatives were discussed at length including but not limited to the risks of infection, bleeding, nerve injury, stiffness, blood clots, the need for revision surgery, cardiopulmonary complications, among others, and they were willing to proceed.  OPERATIVE FINDINGS AND UNIQUE ASPECTS OF THE CASE: Significant laxity especially with extension preoperatively with about 3 to 4 mm opening medially and laterally.  Implants and the patient only had about 1 to 2 mm of laxity with both varus valgus stress. Less than 5 mm anterior posterior translation and 90 degrees of flexion.  ESTIMATED BLOOD LOSS: 200cc  OPERATIVE DESCRIPTION:   Once adequate anesthesia was induced, preoperative antibiotics, 2 gm of ancef,1 gm of Tranexamic Acid, and 8 mg of Decadron administered, the patient was positioned supine with a right thigh tourniquet placed.  The right lower extremity was prepped and draped in sterile fashion.  A time-  out was performed identifying the patient, planned procedure, and the appropriate extremity.  The leg was  exsanguinated,  tourniquet elevated to 250 mmHg.  A midline incision was made utilizing his old scar.  A medial parapatellar arthrotomy was performed.  A large serous effusion was appreciated.  No evidence of purulence or infection.  A circumferential synovectomy was performed.  3 synovial tissue specimens were sent from the infrapatellar, lateral, medial gutters.  These were sent for aerobic anaerobic cultures.  We next performed a large medial release off the tibial plateau with Bovie cautery, to the posterior medial aspect of the tibia..   Suprapatellar fat pad was resected.  Soft tissue at the bone implant interface was resected.  Notably in the medial aspect of the femur there was significant debonding at the cement bone interface.  Using a small osteotome of the polyethylene liner was removed.  There was no significant wear or damage to the polyethylene insert. The patella appeared to be well-fixed and tracking well preoperatively.   We next turned our attention to explanting the femoral and tibial components.  The peripheral edge was exposed circumferentially around the femoral component using Bovie cautery and a rongeur.  Next using quarter inch straight osteotome, quarter inch Shukla flexible osteotome, and a small ACL blade saw.  We were able to break up the implant cement interface circumferentially.  Next using a bone tamp and mallet we were able to easily remove the femoral component.  There was minimal bone loss from the extraction.   Next we turned our attention to explanting the tibial component.  The tibia was also exposed circumferentially and using a ACL saw blade and quarter inch osteotome we were able to break up the implant cement interface.  The tibial component was able to be removed with no significant bone loss.  We then used the morelin instruments to remove the cement mantle including the cement from the keel.  Once this was removed we had access to the canal for reaming.  Morelin reverse curettes  were then used to clear the femoral and tibial canals for reaming.     We next turned our attention to preparing the femoral and and tibial canal for a 135 mm press-fit stem.  We sequentially reamed up to 364m on the femur and 164mon the tibia which had good cortical fit..  We performed a freshen up cut of the distal femur and had good distal bone through the 64m63mut both distally medially and laterally.  Felt that we could restore the joint line without any addition of augments.  Prior to explant of the implants the distance from the medial epicondyles to the joint line was 30 mm medially and 25 mm laterally.      Next we turned our attention to tibial preparation.  We then performed a freshen up cut of the proximal tibia at 0 degrees, cutting 2mm264mf the tibia with the stylus through the 64mm 21m.  We then sized the tibial component to be a size G which had good fit on the remaining tibial plateau.  And relative to the tibial canal the baseplate sat relatively posterior medial.  We used a 6 mm offset and were able to bring the baseplate anterior.  There was minimal central bone loss from the old tibial keel.  We prepped the canal for a cone and had good fill and rotational stability with a size small cone.   Next we turned our attention to femoral sizing.  We felt based on a mild diameter and preop templating a size 9  standard femoral component was most appropriate and match the anterior to posterior size of his prior implant.  The rotation of his prior implant does seem to match the transepicondylar access well, and the patient had good preoperative tracking.  No significant bone loss anterior was appreciated. There was mild posterior bone loss so we cut for 5 mm posterior augments both medially and laterally.  Next we carefully using Bovie cautery did a posterior capsule release to help improve extension.  Trial components were both inserted on the femur and tibia.  I felt that we had relatively symmetric  flexion and extension gaps.  We sized up to the 14 mm insert which seem to be relatively stable in both flexion and extension.    Knee felt stable to varus valgus stress although elected for CPS for final stability.   During this time the tourniquet which was at 110 minutes was deflated, and left down for 20 minutes as we assembled the real components on the back table..   The tourniquet was reinflated for cementing.The components were cemented using antibiotic cement.  Felt we had good press-fit with our stems. The knee was irrigated with surgery for irrigation and normal saline pulse lavage.  The synovial lining was  then injected a dilute Exparel.  We again checked stability and felt 16 mm insert gave improved stability was appropriate with about 1 mm opening medially and 2 mm opening laterally.  And good stability in flexion as well with about 3 to 4 mm anterior to posterior translation. The final poly was inserted. patellar tracking was excellent.         The tourniquet was again let down.  No significant  hemostasis was required.  The medial parapatellar arthrotomy was then reapproximated using   #1 Stratafix sutures and #1 vicryl with the knee in 45 degrees of flexion.  The  remaining wound was closed with 0 stratafix, 2-0 Vicryl, and interrupted 3-0 Nylon  The knee was cleaned, dried, dressed with a Prevena incisional wound VAC.  The patient was then  brought to recovery room in stable condition, tolerating the procedure  well. There were no complications.     Post op recs: WB: WBAT Abx: ancef  while in house, followed by 7 days of cefadroxil 500 twice daily, follow up intra-op tissue sent for culture. Imaging: PACU xrays DVT prophylaxis: Aspirin '81mg'$  BID x4 weeks Follow up: 2 weeks after surgery for a wound check with Dr. Zachery Dakins at [facility].  Address: [address], [place], [place] [address]  Office Phone: [phone]   Charlies Constable, MD Orthopaedic  Surgery

## 2022-04-02 NOTE — Progress Notes (Signed)
Talked to Ortho Tech about applying Foam to Right leg.

## 2022-04-02 NOTE — Anesthesia Procedure Notes (Signed)
Procedure Name: MAC Date/Time: 04/02/2022 12:27 PM  Performed by: Barrington Ellison, CRNAPre-anesthesia Checklist: Patient identified, Emergency Drugs available, Suction available, Patient being monitored and Timeout performed Patient Re-evaluated:Patient Re-evaluated prior to induction Oxygen Delivery Method: Simple face mask

## 2022-04-02 NOTE — Interval H&P Note (Signed)
The patient has been re-examined, and the chart reviewed, and there have been no interval changes to the documented history and physical.    Plan for R TKA revision for R knee aseptic loosening  The operative side was examined and the patient was confirmed to have sensation to DPN, SPN, TN intact, Motor EHL, ext, flex 5/5, and DP 2+, PT 2+, No significant edema.   The risks, benefits, and alternatives have been discussed at length with patient, and the patient is willing to proceed.  Right knee marked. Consent has been signed.  

## 2022-04-02 NOTE — Transfer of Care (Signed)
Immediate Anesthesia Transfer of Care Note  Patient: Thomas Mcclure  Procedure(s) Performed: TOTAL KNEE REVISION (Right: Knee)  Patient Location: PACU  Anesthesia Type:MAC, Regional, and Spinal  Level of Consciousness: drowsy  Airway & Oxygen Therapy: Patient Spontanous Breathing and Patient connected to nasal cannula oxygen  Post-op Assessment: Report given to RN  Post vital signs: Reviewed and stable  Last Vitals:  Vitals Value Taken Time  BP 100/72 04/02/22 1626  Temp    Pulse 72 04/02/22 1627  Resp 15 04/02/22 1627  SpO2 96 % 04/02/22 1627  Vitals shown include unvalidated device data.  Last Pain:  Vitals:   04/02/22 1142  PainSc: 8          Complications: No notable events documented.

## 2022-04-02 NOTE — Anesthesia Procedure Notes (Addendum)
Anesthesia Regional Block: Adductor canal block   Pre-Anesthetic Checklist: , timeout performed,  Correct Patient, Correct Site, Correct Laterality,  Correct Procedure, Correct Position, site marked,  Risks and benefits discussed,  Surgical consent,  Pre-op evaluation,  At surgeon's request and post-op pain management  Laterality: Right and Lower  Prep: chloraprep       Needles:  Injection technique: Single-shot  Needle Type: Echogenic Needle     Needle Length: 9cm  Needle Gauge: 21     Additional Needles:   Procedures:,,,, ultrasound used (permanent image in chart),,    Narrative:  Start time: 04/02/2022 12:08 PM End time: 04/02/2022 12:14 PM Injection made incrementally with aspirations every 5 mL.  Performed by: Personally  Anesthesiologist: Annye Asa, MD  Additional Notes: Pt identified in Holding room.  Monitors applied. Working IV access confirmed. Sterile prep R thigh.  #21ga ECHOgenic Arrow block needle into adductor canal with US guidance.  20cc 0.75% Ropivacaine injected incrementally after negative test dose.  Patient asymptomatic, VSS, no heme aspirated, tolerated well.   Jenita Seashore, MD

## 2022-04-03 ENCOUNTER — Encounter (HOSPITAL_COMMUNITY): Payer: Self-pay | Admitting: Orthopedic Surgery

## 2022-04-03 LAB — BASIC METABOLIC PANEL
Anion gap: 7 (ref 5–15)
BUN: 13 mg/dL (ref 8–23)
CO2: 33 mmol/L — ABNORMAL HIGH (ref 22–32)
Calcium: 8.8 mg/dL — ABNORMAL LOW (ref 8.9–10.3)
Chloride: 96 mmol/L — ABNORMAL LOW (ref 98–111)
Creatinine, Ser: 1.06 mg/dL (ref 0.61–1.24)
GFR, Estimated: >60 mL/min (ref >60)
Glucose, Bld: 129 mg/dL — ABNORMAL HIGH (ref 70–99)
Potassium: 3.3 mmol/L — ABNORMAL LOW (ref 3.5–5.1)
Sodium: 136 mmol/L (ref 135–145)

## 2022-04-03 LAB — CBC
HCT: 34.6 % — ABNORMAL LOW (ref 39.0–52.0)
Hemoglobin: 10.8 g/dL — ABNORMAL LOW (ref 13.0–17.0)
MCH: 24.5 pg — ABNORMAL LOW (ref 26.0–34.0)
MCHC: 31.2 g/dL (ref 30.0–36.0)
MCV: 78.5 fL — ABNORMAL LOW (ref 80.0–100.0)
Platelets: 218 10*3/uL (ref 150–400)
RBC: 4.41 MIL/uL (ref 4.22–5.81)
RDW: 18.4 % — ABNORMAL HIGH (ref 11.5–15.5)
WBC: 10.6 10*3/uL — ABNORMAL HIGH (ref 4.0–10.5)
nRBC: 0 % (ref 0.0–0.2)

## 2022-04-03 LAB — GLUCOSE, CAPILLARY
Glucose-Capillary: 148 mg/dL — ABNORMAL HIGH (ref 70–99)
Glucose-Capillary: 130 mg/dL — ABNORMAL HIGH (ref 70–99)
Glucose-Capillary: 192 mg/dL — ABNORMAL HIGH (ref 70–99)
Glucose-Capillary: 121 mg/dL — ABNORMAL HIGH (ref 70–99)

## 2022-04-03 MED ORDER — OXYCODONE HCL 5 MG PO TABS
20.0000 mg | ORAL_TABLET | ORAL | Status: DC | PRN
  Administered 2022-04-03 – 2022-04-04 (×7): 25 mg via ORAL
  Filled 2022-04-03 (×7): qty 5

## 2022-04-03 NOTE — TOC Initial Note (Signed)
Transition of Care [facility]) - Initial/Assessment Note    Patient Details  Name: Thomas Mcclure MRN: [id number] Date of Birth: Apr 15, 1955  Transition of [facility]) CM/SW Contact:    Sharin Mons, RN Phone Number: 04/03/2022, 4:13 PM  Clinical Narrative:                 S/p Revision R TKA 04/02/22      From home with wife. Independent with ADL's , PTA. No DME usage.   Wife to assist with care once d/c to home. Referral made with [employer] for RW. Equipment will be delivered ot bedside prior to d/c. Pt without transportation issues. No RX med concerns.   TOC team following for needs.  Expected Discharge Plan: Home/Self Care Barriers to Discharge: Continued Medical Work up   Patient Goals and CMS Choice     Choice offered to / list presented to : Patient      Expected Discharge Plan and Services       Living arrangements for the past 2 months: Single Family Home                 DME Arranged: Walker rolling DME Agency: [employer] Date DME Agency Contacted: 04/03/22 Time DME Agency Contacted: [id number] Representative spoke with at DME Agency: Erasmo Downer            Prior Living Arrangements/Services Living arrangements for the past 2 months: [place]   Patient language and need for interpreter reviewed:: Yes Do you feel safe going back to the place where you live?: Yes      Need for Family Participation in Patient Care: Yes (Comment) Care giver support system in place?: Yes (comment)   Criminal Activity/Legal Involvement Pertinent to Current Situation/Hospitalization: No - Comment as needed  Activities of Daily Living Home Assistive Devices/Equipment: Cane (specify quad or straight) ADL Screening (condition at time of admission) Patient's cognitive ability adequate to safely complete daily activities?: Yes Is the patient deaf or have difficulty hearing?: No Does the patient have difficulty seeing, even when wearing glasses/contacts?: No Does the patient  have difficulty concentrating, remembering, or making decisions?: No Patient able to express need for assistance with ADLs?: Yes Does the patient have difficulty dressing or bathing?: No Independently performs ADLs?: Yes (appropriate for developmental age) Does the patient have difficulty walking or climbing stairs?: Yes Weakness of Legs: Right Weakness of Arms/Hands: Right  Permission Sought/Granted   Permission granted to share information with : Yes, Verbal Permission Granted  Share Information with NAME: Stephon Weathers  Spouse  [phone]           Emotional Assessment       Orientation: : Oriented to Self, Oriented to Place, Oriented to  Time, Oriented to Situation Alcohol / Substance Use: Not Applicable Psych Involvement: No (comment)  Admission diagnosis:  Prosthetic joint implant failure, initial encounter [facility]) [T84.019A] Patient Active Problem List   Diagnosis Date Noted   Prosthetic joint implant failure, initial encounter (Thomas Mcclure) 04/02/2022   Atypical chest pain 09/15/2021   Primary localized osteoarthritis of right knee 11/09/2020   Chronic low back pain 02/09/2019   Spinal stenosis of lumbar region with neurogenic claudication 11/22/2018   Radiculopathy of lumbar region 05/31/2017   HCAP (healthcare-associated pneumonia) 05/30/2017   Leukocytosis 05/30/2017   Lumbar pseudoarthrosis 05/27/2017   Unstable angina ([place]) 02/18/2017   Primary localized osteoarthritis of right hip 10/03/2016   Chest pain, rule out acute myocardial infarction 05/01/2016   Diabetes mellitus type 2,  controlled, with complications ([place]) 30/16/0109   Obesity (BMI 30-39.9) 05/01/2016   Left ventricular aneurysm 12/03/2015   Ischemic cardiomyopathy    CAD (coronary artery disease) 07/16/2015   Hypokalemia 07/16/2015   History of non-ST elevation myocardial infarction (NSTEMI) 07/15/2015   Hypertension 07/15/2015   Hyperlipidemia with target LDL less than 100 07/15/2015   GERD  (gastroesophageal reflux disease) 06/18/2012   Chronic radicular low back pain 06/18/2012   PCP:  Selena Batten, MD Pharmacy:   [address] [place] [address] Phone: [phone] Fax: [phone]     Social Determinants of Health (SDOH) Social History: SDOH Screenings   Food Insecurity: No Food Insecurity (04/02/2022)  Housing: Low Risk  (04/02/2022)  Transportation Needs: No Transportation Needs (04/02/2022)  Utilities: Not At Risk (04/02/2022)  Depression (PHQ2-9): Low Risk  (02/11/2018)  Tobacco Use: Medium Risk (04/02/2022)   SDOH Interventions:     Readmission Risk Interventions     No data to display

## 2022-04-03 NOTE — Progress Notes (Signed)
Physical Therapy Treatment Patient Details Name: Thomas Mcclure MRN: [id number] DOB: September 19, 1955 Today's Date: 04/03/2022   History of Present Illness 67 y.o. male S/p Revision R TKA 04/02/22. PMH: multiple back surgeries, RA, MI, HTN, CAD, DM    PT Comments    Patient is progressing well towards physical therapy goals, ambulating up to 90  feet this afternoon. Educated on LE exercises, precautions, bone foam use and modalities for pain control (ice machine.) Min guard for safety with ambulation using RW for support. Slight instability of Rt knee with increased WB. Instructed on symmetry of gait and unloading for improved quality steps. Will need stair training tomorrow prior to d/c.    Recommendations for follow up therapy are one component of a multi-disciplinary discharge planning process, led by the attending physician.  Recommendations may be updated based on patient status, additional functional criteria and insurance authorization.  Follow Up Recommendations  Follow physician's recommendations for discharge plan and follow up therapies     Assistance Recommended at Discharge Intermittent Supervision/Assistance  Patient can return home with the following A little help with walking and/or transfers;A little help with bathing/dressing/bathroom;Assistance with cooking/housework;Assist for transportation;Help with stairs or ramp for entrance   Equipment Recommendations  Rolling walker (2 wheels)    Recommendations for Other Services       Precautions / Restrictions Precautions Precautions: Knee;Fall Precaution Booklet Issued: Yes (comment) Precaution Comments: Reviewed precautions post-op knee Required Braces or Orthoses:  (Bone foam) Restrictions Weight Bearing Restrictions: Yes RLE Weight Bearing: Weight bearing as tolerated     Mobility  Bed Mobility Overal bed mobility: Needs Assistance Bed Mobility: Supine to Sit, Sit to Supine     Supine to sit: Min guard Sit to  supine: Min guard   General bed mobility comments: Min guard for safety, no physical assist required this afternoon, cues for technique.    Transfers Overall transfer level: Needs assistance Equipment used: Rolling walker (2 wheels) Transfers: Sit to/from Stand Sit to Stand: Min assist           General transfer comment: Light assist for boost to rise from bed with cues for hand placement. Slow and effortful, reduced WB through RLE.    Ambulation/Gait Ambulation/Gait assistance: Min guard Gait Distance (Feet): 90 Feet Assistive device: Rolling walker (2 wheels) Gait Pattern/deviations: Step-to pattern, Step-through pattern, Decreased stride length, Decreased stance time - right, Knee flexed in stance - right, Antalgic Gait velocity: decr Gait velocity interpretation: <1.31 ft/sec, indicative of household ambulator   General Gait Details: Further training for gait symmetry. Cues not to release RW when ambulating in case knee buckles. Noticed some instability in Rt knee with increased WB. Still flexed through gait cycle, moderate improvement with cues to correct.   Stairs             Wheelchair Mobility    Modified Rankin (Stroke Patients Only)       Balance Overall balance assessment: Needs assistance Sitting-balance support: No upper extremity supported, Feet supported Sitting balance-Leahy Scale: Good     Standing balance support: No upper extremity supported, During functional activity Standing balance-Leahy Scale: Fair Standing balance comment: Prefers RW support                            Cognition Arousal/Alertness: Awake/alert Behavior During Therapy: WFL for tasks assessed/performed Overall Cognitive Status: Within Functional Limits for tasks assessed  Exercises Total Joint Exercises Ankle Circles/Pumps: AROM, Both, 10 reps, Seated Quad Sets: Strengthening, Both, 10 reps,  Seated Towel Squeeze: Strengthening, Both, 10 reps, Supine Short Arc Quad: Strengthening, Right, 5 reps, Supine Heel Slides: Right, 5 reps, Seated, AAROM (limited range) Hip ABduction/ADduction: Strengthening, Right, 10 reps, Supine Straight Leg Raises: Strengthening, AAROM, Right, 5 reps, Supine Knee Flexion: AROM, Right, 5 reps, Seated Goniometric ROM: approx 80 deg flexion, 10 deg extension lag    General Comments General comments (skin integrity, edema, etc.): Reviewed precautions, leg section of bed locked at 0deg.      Pertinent Vitals/Pain Pain Assessment Pain Assessment: Faces Faces Pain Scale: Hurts even more Pain Location: Rt knee Pain Descriptors / Indicators: Discomfort, Grimacing, Guarding Pain Intervention(s): Monitored during session, Repositioned    Home Living                          Prior Function            PT Goals (current goals can now be found in the care plan section) Acute Rehab PT Goals Patient Stated Goal: Get well, no more surgery PT Goal Formulation: With patient Time For Goal Achievement: 04/10/22 Potential to Achieve Goals: Good Progress towards PT goals: Progressing toward goals    Frequency    7X/week      PT Plan Current plan remains appropriate    Co-evaluation              AM-PAC PT "6 Clicks" Mobility   Outcome Measure  Help needed turning from your back to your side while in a flat bed without using bedrails?: None Help needed moving from lying on your back to sitting on the side of a flat bed without using bedrails?: A Little Help needed moving to and from a bed to a chair (including a wheelchair)?: A Little Help needed standing up from a chair using your arms (e.g., wheelchair or bedside chair)?: A Little Help needed to walk in hospital room?: A Little Help needed climbing 3-5 steps with a railing? : A Lot 6 Click Score: 18    End of Session Equipment Utilized During Treatment: Gait belt Activity  Tolerance: Patient tolerated treatment well Patient left: with call bell/phone within reach;in bed;with bed alarm set;Other (comment);with family/visitor present (Ice machine applied, in bone foam) Nurse Communication: Mobility status PT Visit Diagnosis: Unsteadiness on feet (R26.81);Other abnormalities of gait and mobility (R26.89);Pain;Difficulty in walking, not elsewhere classified (R26.2) Pain - Right/Left: Right Pain - part of body: Knee     Time: 1341-1417 PT Time Calculation (min) (ACUTE ONLY): 36 min  Charges:  $Gait Training: 8-22 mins $Therapeutic Exercise: 8-22 mins                     Candie Mile, PT, DPT Physical Therapist Acute Rehabilitation Services [place]    Ellouise Newer 04/03/2022, 3:14 PM

## 2022-04-03 NOTE — Evaluation (Signed)
Physical Therapy Evaluation Patient Details Name: Thomas Mcclure MRN: [id number] DOB: 10-Feb-1956 Today's Date: 04/03/2022  History of Present Illness  67 y.o. male S/p Revision R TKA 04/02/22. PMH: multiple back surgeries, RA, MI, HTN, CAD, DM  Clinical Impression  Patient is s/p above surgery resulting in functional limitations due to the deficits listed below (see PT Problem List). Educated on precautions, exercises, and safety with mobility. Min assist for bed mobility and transfers, min guard to ambulate in hallway up to 90 feet this morning focusing on symmetry of gait and safe AD use. Pt reports 24/7 support available from wife. Ice machine reapplied with bone foam in place RLE. Patient will benefit from skilled PT to increase their independence and safety with mobility to allow discharge to the venue listed below.          Recommendations for follow up therapy are one component of a multi-disciplinary discharge planning process, led by the attending physician.  Recommendations may be updated based on patient status, additional functional criteria and insurance authorization.  Follow Up Recommendations Follow physician's recommendations for discharge plan and follow up therapies      Assistance Recommended at Discharge Intermittent Supervision/Assistance  Patient can return home with the following  A little help with walking and/or transfers;A little help with bathing/dressing/bathroom;Assistance with cooking/housework;Assist for transportation;Help with stairs or ramp for entrance    Equipment Recommendations Rolling walker (2 wheels)  Recommendations for Other Services       Functional Status Assessment Patient has had a recent decline in their functional status and demonstrates the ability to make significant improvements in function in a reasonable and predictable amount of time.     Precautions / Restrictions Precautions Precautions: Knee;Fall Precaution Comments: Reviewed  precautions post-op knee Required Braces or Orthoses:  (Bone foam) Restrictions Weight Bearing Restrictions: Yes RLE Weight Bearing: Weight bearing as tolerated      Mobility  Bed Mobility Overal bed mobility: Needs Assistance Bed Mobility: Supine to Sit     Supine to sit: Min assist     General bed mobility comments: min assist for RLE support to bring out of bed, limited by pain. Cues for technique.    Transfers Overall transfer level: Needs assistance Equipment used: Rolling walker (2 wheels) Transfers: Sit to/from Stand Sit to Stand: Min assist           General transfer comment: Min assist for light stability, good strength through LLE. Stable once upright on RW, tolerating some WB through RLE. Cues for technique    Ambulation/Gait Ambulation/Gait assistance: Min guard Gait Distance (Feet): 90 Feet Assistive device: Rolling walker (2 wheels) Gait Pattern/deviations: Step-to pattern, Step-through pattern, Decreased stride length, Decreased stance time - right, Knee flexed in stance - right, Antalgic Gait velocity: decr Gait velocity interpretation: <1.31 ft/sec, indicative of household ambulator   General Gait Details: Educated on safe AD use with RW for support. Good UE strength, progressed with cues for symmetry. No buckling noted, starting to perform step-through patter, cues for Rt knee extension in mid to terminal stance.  Stairs            Wheelchair Mobility    Modified Rankin (Stroke Patients Only)       Balance Overall balance assessment: Needs assistance Sitting-balance support: No upper extremity supported, Feet supported Sitting balance-Leahy Scale: Good     Standing balance support: No upper extremity supported, During functional activity Standing balance-Leahy Scale: Fair Standing balance comment: Prefers RW support  Pertinent Vitals/Pain Pain Assessment Pain Assessment: Faces Faces Pain  Scale: Hurts whole lot Pain Location: Rt knee Pain Descriptors / Indicators: Discomfort, Grimacing, Guarding Pain Intervention(s): Monitored during session, Premedicated before session, Repositioned, Limited activity within patient's tolerance    Home Living Family/patient expects to be discharged to:: Private residence Living Arrangements: Spouse/significant other Available Help at Discharge: Family;Available 24 hours/day Type of Home: Apartment Home Access: Stairs to enter Entrance Stairs-Rails: Right;Left;Can reach both Entrance Stairs-Number of Steps: 2   Home Layout: One level Home Equipment: Cane - single point      Prior Function Prior Level of Function : Needs assist             Mobility Comments: Used SPC, drives, works as DJ ADLs Comments: Wife needed to help with donning socks/shoes on Rt     Hand Dominance   Dominant Hand: Right    Extremity/Trunk Assessment   Upper Extremity Assessment Upper Extremity Assessment: Defer to OT evaluation    Lower Extremity Assessment Lower Extremity Assessment: RLE deficits/detail RLE Deficits / Details: Pain, weakness and limited ROM as expected post op RLE: Unable to fully assess due to pain       Communication   Communication: No difficulties  Cognition Arousal/Alertness: Awake/alert Behavior During Therapy: WFL for tasks assessed/performed Overall Cognitive Status: Within Functional Limits for tasks assessed                                          General Comments General comments (skin integrity, edema, etc.): Educated on precautions, exercises, Rt knee extension. Ice machine applied.    Exercises Total Joint Exercises Ankle Circles/Pumps: AROM, Both, 10 reps, Seated Quad Sets: Strengthening, Both, 10 reps, Seated Heel Slides: AROM, Right, 5 reps, Seated (limited range)   Assessment/Plan    PT Assessment Patient needs continued PT services  PT Problem List Decreased strength;Decreased  range of motion;Decreased activity tolerance;Decreased balance;Decreased mobility;Decreased knowledge of use of DME;Decreased knowledge of precautions;Pain       PT Treatment Interventions DME instruction;Gait training;Stair training;Functional mobility training;Therapeutic activities;Therapeutic exercise;Balance training;Neuromuscular re-education;Patient/family education;Modalities    PT Goals (Current goals can be found in the Care Plan section)  Acute Rehab PT Goals Patient Stated Goal: Get well, no more surgery PT Goal Formulation: With patient Time For Goal Achievement: 04/10/22 Potential to Achieve Goals: Good    Frequency 7X/week     Co-evaluation               AM-PAC PT "6 Clicks" Mobility  Outcome Measure Help needed turning from your back to your side while in a flat bed without using bedrails?: None Help needed moving from lying on your back to sitting on the side of a flat bed without using bedrails?: A Little Help needed moving to and from a bed to a chair (including a wheelchair)?: A Little Help needed standing up from a chair using your arms (e.g., wheelchair or bedside chair)?: A Little Help needed to walk in hospital room?: A Little Help needed climbing 3-5 steps with a railing? : A Lot 6 Click Score: 18    End of Session Equipment Utilized During Treatment: Gait belt Activity Tolerance: Patient tolerated treatment well Patient left: in chair;with call bell/phone within reach;with chair alarm set;Other (comment) (Ice machine applied) Nurse Communication: Mobility status PT Visit Diagnosis: Unsteadiness on feet (R26.81);Other abnormalities of gait and mobility (R26.89);Pain;Difficulty in walking, not  elsewhere classified (R26.2) Pain - Right/Left: Right Pain - part of body: Knee    Time: 1749-4496 PT Time Calculation (min) (ACUTE ONLY): 40 min   Charges:   PT Evaluation $PT Eval Low Complexity: 1 Low PT Treatments $Gait Training: 8-22  mins $Therapeutic Activity: 8-22 mins        Candie Mile, PT, DPT Physical Therapist Acute Rehabilitation Services [place]   Ellouise Newer 04/03/2022, 11:49 AM

## 2022-04-03 NOTE — Progress Notes (Signed)
PT Cancellation Note  Patient Details Name: Thomas Mcclure MRN: [id number] DOB: 1955-10-18   Cancelled Treatment:    Reason Eval/Treat Not Completed: Patient declined, no reason specified  Declines states he is waiting for pain medication. RN notified. Will check back later for PT evaluation.  Ellouise Newer 04/03/2022, 9:07 AM

## 2022-04-03 NOTE — Progress Notes (Signed)
     Subjective: Patient reports pain as moderate.  Patient's belbuca was unavailable from pharmacy but has otherwise been managing pain with other PRN pain meds. Including IV dilaudid and oxy '20mg'$  q4. Plan for PT today. Denies distal n/t. Possible DC home tomorrow if making good progress.  Objective:   VITALS:   Vitals:   04/02/22 1856 04/02/22 1947 04/03/22 0534 04/03/22 0736  BP: 122/76 121/79 (!) 149/88 131/65  Pulse: 61 72 98 100  Resp: '18 20 20 20  '$ Temp: 98 F (36.7 C) (!) 97.4 F (36.3 C)  99.5 F (37.5 C)  TempSrc: Oral Oral  Oral  SpO2: 93% 97% 97%   Weight:      Height:        Sensation intact distally Intact pulses distally Dorsiflexion/Plantar flexion intact Incision: prevena wound vacdressing holding suction, no output in cannsiter Compartment soft   Lab Results  Component Value Date   WBC 10.6 (H) 04/03/2022   HGB 10.8 (L) 04/03/2022   HCT 34.6 (L) 04/03/2022   MCV 78.5 (L) 04/03/2022   PLT 218 04/03/2022   BMET    Component Value Date/Time   NA 136 04/03/2022 0242   NA 136 10/06/2018 0948   K 3.3 (L) 04/03/2022 0242   CL 96 (L) 04/03/2022 0242   CO2 33 (H) 04/03/2022 0242   GLUCOSE 129 (H) 04/03/2022 0242   BUN 13 04/03/2022 0242   BUN 13 10/06/2018 0948   CREATININE 1.06 04/03/2022 0242   CREATININE 0.89 02/13/2016 0931   CALCIUM 8.8 (L) 04/03/2022 0242   GFRNONAA >60 04/03/2022 0242      Xray: post op xrays show TKA components in good position, no adverse features  Assessment/Plan: 1 Day Post-Op   Principal Problem:   Prosthetic joint implant failure, initial encounter ([place])  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 Intra-op cxs NGTD Imaging: PACU xrays DVT prophylaxis: Aspirin '81mg'$  BID x4 weeks Follow up: 2 weeks after surgery for a wound check with Dr. Zachery Dakins at [facility].   Address: [address] [place], [place], [place] [address]  Office Phone: [phone]   Willaim Sheng 04/03/2022, 7:43 AM   Charlies Constable, MD  Contact information:   [phone] 7am-5pm epic message Dr. Zachery Dakins, or call office for patient follow up: [phone] After hours and holidays please check [email or web address] for group call information for [facility]

## 2022-04-04 ENCOUNTER — Telehealth (HOSPITAL_COMMUNITY): Payer: Self-pay | Admitting: Orthopedic Surgery

## 2022-04-04 ENCOUNTER — Other Ambulatory Visit (HOSPITAL_COMMUNITY): Payer: Self-pay

## 2022-04-04 ENCOUNTER — Encounter: Payer: Self-pay | Admitting: Internal Medicine

## 2022-04-04 ENCOUNTER — Telehealth: Payer: Self-pay | Admitting: Interventional Cardiology

## 2022-04-04 LAB — BASIC METABOLIC PANEL
Anion gap: 13 (ref 5–15)
BUN: 11 mg/dL (ref 8–23)
CO2: 29 mmol/L (ref 22–32)
Calcium: 8.6 mg/dL — ABNORMAL LOW (ref 8.9–10.3)
Chloride: 92 mmol/L — ABNORMAL LOW (ref 98–111)
Creatinine, Ser: 0.94 mg/dL (ref 0.61–1.24)
GFR, Estimated: >60 mL/min (ref >60)
Glucose, Bld: 106 mg/dL — ABNORMAL HIGH (ref 70–99)
Potassium: 3.1 mmol/L — ABNORMAL LOW (ref 3.5–5.1)
Sodium: 134 mmol/L — ABNORMAL LOW (ref 135–145)

## 2022-04-04 LAB — GLUCOSE, CAPILLARY
Glucose-Capillary: 143 mg/dL — ABNORMAL HIGH (ref 70–99)
Glucose-Capillary: 136 mg/dL — ABNORMAL HIGH (ref 70–99)

## 2022-04-04 LAB — CBC
HCT: 31.8 % — ABNORMAL LOW (ref 39.0–52.0)
Hemoglobin: 10.2 g/dL — ABNORMAL LOW (ref 13.0–17.0)
MCH: 24.7 pg — ABNORMAL LOW (ref 26.0–34.0)
MCHC: 32.1 g/dL (ref 30.0–36.0)
MCV: 77 fL — ABNORMAL LOW (ref 80.0–100.0)
Platelets: 188 10*3/uL (ref 150–400)
RBC: 4.13 MIL/uL — ABNORMAL LOW (ref 4.22–5.81)
RDW: 18.4 % — ABNORMAL HIGH (ref 11.5–15.5)
WBC: 13.1 10*3/uL — ABNORMAL HIGH (ref 4.0–10.5)
nRBC: 0 % (ref 0.0–0.2)

## 2022-04-04 MED ORDER — ACETAMINOPHEN 500 MG PO TABS: 1000.0000 mg | ORAL_TABLET | Freq: Three times a day (TID) | ORAL | 0 refills | Status: AC | PRN

## 2022-04-04 MED ORDER — POTASSIUM CHLORIDE CRYS ER 20 MEQ PO TBCR
20.0000 meq | EXTENDED_RELEASE_TABLET | Freq: Two times a day (BID) | ORAL | Status: DC
  Administered 2022-04-04: 20 meq via ORAL
  Filled 2022-04-04: qty 1

## 2022-04-04 MED ORDER — CEFADROXIL 500 MG PO CAPS
500.0000 mg | ORAL_CAPSULE | Freq: Two times a day (BID) | ORAL | 0 refills | Status: AC
  Filled 2022-04-04: qty 14, 7d supply, fill #0

## 2022-04-04 MED ORDER — ASPIRIN 81 MG PO CHEW: 81.0000 mg | CHEWABLE_TABLET | Freq: Two times a day (BID) | ORAL | 0 refills | Status: AC

## 2022-04-04 MED ORDER — POTASSIUM CHLORIDE CRYS ER 20 MEQ PO TBCR
20.0000 meq | EXTENDED_RELEASE_TABLET | Freq: Two times a day (BID) | ORAL | 0 refills | Status: DC
  Filled 2022-04-04: qty 14, 7d supply, fill #0

## 2022-04-04 MED ORDER — METHOCARBAMOL 500 MG PO TABS
500.0000 mg | ORAL_TABLET | Freq: Three times a day (TID) | ORAL | 0 refills | Status: AC | PRN
  Filled 2022-04-04: qty 40, 14d supply, fill #0

## 2022-04-04 MED ORDER — OXYCODONE HCL 5 MG PO TABS
5.0000 mg | ORAL_TABLET | ORAL | 0 refills | Status: DC | PRN
  Filled 2022-04-04: qty 40, 7d supply, fill #0

## 2022-04-04 MED ORDER — OXYCODONE HCL 20 MG PO TABS
20.0000 mg | ORAL_TABLET | ORAL | 0 refills | Status: DC | PRN
  Filled 2022-04-04 – 2022-04-12 (×2): qty 80, 14d supply, fill #0

## 2022-04-04 MED ORDER — ONDANSETRON HCL 4 MG PO TABS
4.0000 mg | ORAL_TABLET | Freq: Three times a day (TID) | ORAL | 0 refills | Status: AC | PRN
  Filled 2022-04-04: qty 14, 5d supply, fill #0

## 2022-04-04 NOTE — Telephone Encounter (Signed)
Spoke to patient's wife, patient got a dose of potasium this morning to replete K+ of 3.1. Likely 2/2 to LR fluids which have been stopped. Sent patient prescription for oral potassium as he has been on this in the past. Will recheck when he comes into clinic next week.

## 2022-04-04 NOTE — Telephone Encounter (Signed)
Pts wife is calling our office from the hospital.  Pt is currently admitted for s/p right total knee revision.  His anticipated discharge is today.  Wife states she noticed his K level in mychart showing 3.1 and the pt is getting ready to be discharged and his potassium supplementation has been taken off of his medication list.   Wife states that [name] had him on [facility] and his PCP took the pt off of this prior to the pt going in for orthopedic surgery, for wife states the PCP told them he didn't need the supplementation anymore for he could obtain this through his diet.   Wife is now concerned because of the low K level (hospital labs) of 3.1 showing up in the pts mychart and he will be discharged today with no supplementation for this.   Advised the pts wife that she should speak with the discharge nurse about this and have her run this by his Orthopedic Surgeon or discharging MD, so that they can safely advise if the pts should have potassium supplementation sent to his pharmacy to have and take after discharge.  Advised the pts wife to make sure she follows all the discharge paperwork and arrange all follow-up appts accordingly as it advises, and especially with the PCP in one week.  That way his labs can be closely followed.   Wife verbalized understanding and agrees with this plan.

## 2022-04-04 NOTE — Discharge Summary (Signed)
Physician Discharge Summary  Patient ID: Thomas Mcclure MRN: [id number] DOB/AGE: 1955-03-10 67 y.o.  Admit date: 04/02/2022 Discharge date: 04/04/2022  Admission Diagnoses:  Prosthetic joint implant failure, initial encounter [facility])  Discharge Diagnoses:  Principal Problem:   Prosthetic joint implant failure, initial encounter [facility])   Past Medical History:  Diagnosis Date   Baker's cyst    Left calf   CAD in native artery, 07/15/15 PCI of RCA with DES 07/16/2015   a.   NSTEMI 5/17: LHC - pLAD 20, pLCx 20, OM1 30, pRCA 100, EF 45-50%>> PCI: 2.5 x 24 mm Promus DES to RCA  //  b.   Echo 5/17: mild LVH, EF 50-55%, no RWMA, mod RVE //  c. 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 [name] [phone]   Iliac artery aneurysm ([place])    1.5 cm left IIA 09/15/21 CTA; follow-up in ~ 18 months per vascular surgeon   Ischemic cardiomyopathy    a. LV-gram at time of LHC in 6/17 with EF 35-45%  //  b. 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 male who was admitted  04/02/2022 with a diagnosis of Prosthetic joint implant failure, initial encounter ([place]) (aseptic loosening and instability right total knee arthroplasty) and went to the operating room on 04/02/2022 and underwent the above named procedures.    He was given perioperative antibiotics:  Anti-infectives (From admission, onward)    Start     Dose/Rate Route Frequency Ordered Stop   04/04/22 0000  cefadroxil (DURICEF) 500 MG capsule        500 mg Oral 2 times daily 04/04/22 0743 04/11/22 2359   04/02/22 2200  ceFAZolin (ANCEF) IVPB 2g/100 mL premix        2 g 200 mL/hr over 30 Minutes Intravenous Every 8 hours 04/02/22 1916 04/05/22 2159   04/02/22 1130  ceFAZolin (ANCEF) IVPB 2g/100 mL premix        2 g 200 mL/hr over 30 Minutes Intravenous On call to O.R. 04/02/22 1123 04/02/22 1240     .  He was given sequential compression devices, early ambulation, and aspirin for DVT prophylaxis.  He benefited maximally from the hospital stay and there were no complications.    Recent vital signs:  Vitals:   04/04/22 0441 04/04/22 0732  BP: 130/75 132/84  Pulse: 90 91  Resp: 17 20  Temp: 100.2 F (37.9 C) 99.1 F (37.3 C)  SpO2: 95% 96%    Recent laboratory studies:  Lab Results  Component Value Date   HGB 10.2 (L) 04/04/2022   HGB 10.8 (L) 04/03/2022   HGB 13.2 03/24/2022   Lab Results  Component Value Date   WBC 13.1 (H) 04/04/2022   PLT 188 04/04/2022   Lab Results  Component Value Date   INR 0.9 11/01/2020   Lab Results  Component Value Date   NA 134 (L) 04/04/2022   K 3.1 (L) 04/04/2022   CL 92 (L) 04/04/2022   CO2 29 04/04/2022   BUN 11 04/04/2022   CREATININE 0.94 04/04/2022   GLUCOSE 106 (H) 04/04/2022    Discharge Medications:   Allergies as of 04/04/2022       Reactions   Aquacel Extra Hydrofiber [wound Dressings] Other (See Comments)   Blisters to the skin   Brilinta [ticagrelor] Shortness Of Breath, Other (See Comments)   CHEST PAIN    Methylprednisolone  Other (See Comments)   DIAPHORESIS, HYPOTENSION   Prednisone Other (See Comments)   DIAPHORESIS, HYPOTENSION   Toradol [ketorolac Tromethamine] Other (See Comments)   DIAPHORESIS, HYPOTENSION   Adhesive [tape] Rash, Other (See Comments)   "blisters" ( NO SURGICAL TAPE, PLEASE)        Medication List     STOP taking these medications    cyclobenzaprine 10 MG tablet Commonly known as: FLEXERIL       TAKE these medications    Accu-Chek Aviva Plus test strip Generic drug: glucose blood   Accu-Chek Aviva Plus test strip Generic drug: glucose blood Use to test 4 times a day as directed   Accu-Chek Softclix Lancets lancets 4 (four) times daily.   acetaminophen 500 MG tablet Commonly known as: TYLENOL Take 1,000 mg by mouth 2 (two) times daily as needed (pain.). What changed: Another medication with the same name was added. Make sure you understand how and when to take each.   acetaminophen 500 MG tablet Commonly known as: TYLENOL Take 2 tablets (1,000 mg total) by mouth every 8 (eight) hours as needed. What changed: You were already taking a medication with the same name, and this prescription was added. Make sure you understand how and when to take each.   aspirin 81 MG chewable tablet Chew 1 tablet (81 mg total) by mouth in the morning and at bedtime. What changed: when to take this   atorvastatin 20 MG tablet Commonly known as: LIPITOR Take 1 tablet (20 mg total) by mouth at bedtime.   Belbuca 900 MCG Film Generic drug: Buprenorphine HCl Place 1 film inside cheeks two times daily   carvedilol 12.5 MG tablet Commonly known as: COREG Take 1 tablet (12.5 mg total) by mouth 2 (two) times daily with a meal.   cefadroxil 500 MG capsule Commonly known as: DURICEF Take 1 capsule (500 mg total) by mouth 2 (two) times daily for 7 days.   chlorthalidone 25 MG tablet Commonly known as: HYGROTON Take 1 tablet (25 mg total) by mouth daily.   diclofenac Sodium 1 %  Gel Commonly known as: VOLTAREN Apply small amount to the affected area 3 times a day as needed for pain   diltiazem 180 MG 24 hr capsule Commonly known as: Dilt-XR Take 1 capsule (180 mg total) by mouth daily.   docusate sodium 100 MG capsule Commonly known as: COLACE Take 1 capsule (100 mg total) by mouth 2 (two) times daily. What changed: how much to take   DULoxetine 60 MG capsule Commonly  known as: CYMBALTA Take 1 capsule (60 mg total) by mouth daily.   Farxiga 10 MG Tabs tablet Generic drug: dapagliflozin propanediol Take 1 tablet (10 mg total) by mouth daily.   FreeStyle Libre 2 Sensor Misc Use to test blood sugar, change every other week   glipiZIDE 10 MG 24 hr tablet Commonly known as: GLUCOTROL XL Take 2 tablets (20 mg total) by mouth daily at breakfast.   Linzess 145 MCG Caps capsule Generic drug: linaclotide Take 1 capsule (145 mcg total) by mouth daily.   lisinopril 20 MG tablet Commonly known as: ZESTRIL Take 1 tablet (20 mg total) by mouth daily.   lubiprostone 24 MCG capsule Commonly known as: Amitiza Take 2 capsules (48 mcg total) by mouth at bedtime.   metFORMIN 1000 MG tablet Commonly known as: GLUCOPHAGE Take 1 tablet (1,000 mg total) by mouth 2 (two) times daily.   methocarbamol 500 MG tablet Commonly known as: ROBAXIN Take 1 tablet (500 mg total) by mouth every 8 (eight) hours as needed for up to 13 days for muscle spasms. What changed:  medication strength how much to take how to take this when to take this reasons to take this Another medication with the same name was removed. Continue taking this medication, and follow the directions you see here.   multivitamin with minerals Tabs tablet Take 1 tablet by mouth daily.   naloxone 4 MG/0.1ML Liqd nasal spray kit Commonly known as: Narcan Place 1 spray into the nose as needed. If found unresposive then spray this into nose and call 911 immediately   omeprazole 40 MG capsule Commonly  known as: PRILOSEC Take 1 capsule (40 mg total) by mouth daily.   ondansetron 4 MG tablet Commonly known as: Zofran Take 1 tablet (4 mg total) by mouth every 8 (eight) hours as needed for up to 14 days for nausea or vomiting.   Oxycodone HCl 20 MG Tabs Take 1 tablet (20 mg total) by mouth every 4 (four) hours as needed. What changed:  when to take this Another medication with the same name was removed. Continue taking this medication, and follow the directions you see here.   oxyCODONE 5 MG immediate release tablet Commonly known as: Roxicodone Take 1 tablet (5 mg) by mouth every 4 hours as needed for up to 7 days for breakthrough pain (for breakthrough pain in addition to the 1m oxycodone as needed). What changed:  when to take this reasons to take this Another medication with the same name was removed. Continue taking this medication, and follow the directions you see here.   pregabalin 150 MG capsule Commonly known as: LYRICA Take 1 capsule (150 mg total) by mouth 2 (two) times daily.   sildenafil 100 MG tablet Commonly known as: VIAGRA Take 1 tablet (100 mg total) by mouth daily as needed.               Durable Medical Equipment  (From admission, onward)           Start     Ordered   04/03/22 1607  For home use only DME Walker rolling  Once       Question Answer Comment  Walker: With 5 Inch Wheels   Patient needs a walker to treat with the following condition Gait instability      04/03/22 1607            Diagnostic Studies: DG Knee Right Port  Result Date: 04/02/2022 CLINICAL DATA:  Status post total  right knee arthroplasty revision. EXAM: PORTABLE RIGHT KNEE - 1-2 VIEW COMPARISON:  Right tibia and fibula radiographs 10/24/2015 FINDINGS: Postsurgical changes are seen of total right knee arthroplasty. On the only evaluate 302017 radiographs no right knee arthroplasty hardware is seen. The femoral and tibial stems are moderately long consistent with  reported arthroplasty hardware revision. No perihardware lucency is seen to indicate hardware failure or loosening. Expected postoperative intra-articular and subcutaneous air. Small joint effusion. No acute fracture or dislocation. A likely vacuum assisted drain overlies the distal anterior thigh. IMPRESSION: Status post total right knee arthroplasty revision without evidence of hardware failure. Electronically Signed   By: Yvonne Kendall M.D.   On: 04/02/2022 17:47    Disposition: Discharge disposition: 01-Home or Self Care       Discharge Instructions     Call MD / Call 911   Complete by: As directed    If you experience chest pain or shortness of breath, CALL 911 and be transported to the hospital emergency room.  If you develope a fever above 101 F, pus (white drainage) or increased drainage or redness at the wound, or calf pain, call your surgeon's office.   Constipation Prevention   Complete by: As directed    Drink plenty of fluids.  Prune juice may be helpful.  You may use a stool softener, such as Colace (over the counter) 100 mg twice a day.  Use MiraLax (over the counter) for constipation as needed.   Diet - low sodium heart healthy   Complete by: As directed    Do not put a pillow under the knee. Place it under the heel.   Complete by: As directed    Increase activity slowly as tolerated   Complete by: As directed    Post-operative opioid taper instructions:   Complete by: As directed    POST-OPERATIVE OPIOID TAPER INSTRUCTIONS: It is important to wean off of your opioid medication as soon as possible. If you do not need pain medication after your surgery it is ok to stop day one. Opioids include: Codeine, Hydrocodone(Norco, Vicodin), Oxycodone(Percocet, oxycontin) and hydromorphone amongst others.  Long term and even short term use of opiods can cause: Increased pain response Dependence Constipation Depression Respiratory depression And more.  Withdrawal symptoms can  include Flu like symptoms Nausea, vomiting And more Techniques to manage these symptoms Hydrate well Eat regular healthy meals Stay active Use relaxation techniques(deep breathing, meditating, yoga) Do Not substitute Alcohol to help with tapering If you have been on opioids for less than two weeks and do not have pain than it is ok to stop all together.  Plan to wean off of opioids This plan should start within one week post op of your joint replacement. Maintain the same interval or time between taking each dose and first decrease the dose.  Cut the total daily intake of opioids by one tablet each day Next start to increase the time between doses. The last dose that should be eliminated is the evening dose.           Follow-up Information     Selena Batten, MD Follow up.   Specialty: Internal Medicine                   Discharge Instructions      INSTRUCTIONS AFTER JOINT REPLACEMENT   Remove items at home which could result in a fall. This includes throw rugs or furniture in walking pathways ICE to the affected joint every  three hours while awake for 30 minutes at a time, for at least the first 3-5 days, and then as needed for pain and swelling.  Continue to use ice for pain and swelling. You may notice swelling that will progress down to the foot and ankle.  This is normal after surgery.  Elevate your leg when you are not up walking on it.   Continue to use the breathing machine you got in the hospital (incentive spirometer) which will help keep your temperature down.  It is common for your temperature to cycle up and down following surgery, especially at night when you are not up moving around and exerting yourself.  The breathing machine keeps your lungs expanded and your temperature down.   DIET:  As you were doing prior to hospitalization, we recommend a well-balanced diet.  DRESSING / WOUND CARE / SHOWERING  Keep the surgical dressing until follow up.   The dressing is water proof, so you can shower without any extra covering.  IF THE DRESSING FALLS OFF or the wound gets wet inside, change the dressing with sterile gauze.  Please use good hand washing techniques before changing the dressing.  Do not use any lotions or creams on the incision until instructed by your surgeon.    ACTIVITY  Increase activity slowly as tolerated, but follow the weight bearing instructions below.   No driving for 6 weeks or until further direction given by your physician.  You cannot drive while taking narcotics.  No lifting or carrying greater than 10 lbs. until further directed by your surgeon. Avoid periods of inactivity such as sitting longer than an hour when not asleep. This helps prevent blood clots.  You may return to work once you are authorized by your doctor.     WEIGHT BEARING   Weight bearing as tolerated with assist device (walker, cane, etc) as directed, use it as long as suggested by your surgeon or therapist, typically at least 4-6 weeks.   EXERCISES  Results after joint replacement surgery are often greatly improved when you follow the exercise, range of motion and muscle strengthening exercises prescribed by your doctor. Safety measures are also important to protect the joint from further injury. Any time any of these exercises cause you to have increased pain or swelling, decrease what you are doing until you are comfortable again and then slowly increase them. If you have problems or questions, call your caregiver or physical therapist for advice.   Rehabilitation is important following a joint replacement. After just a few days of immobilization, the muscles of the leg can become weakened and shrink (atrophy).  These exercises are designed to build up the tone and strength of the thigh and leg muscles and to improve motion. Often times heat used for twenty to thirty minutes before working out will loosen up your tissues and help with improving  the range of motion but do not use heat for the first two weeks following surgery (sometimes heat can increase post-operative swelling).   These exercises can be done on a training (exercise) mat, on the floor, on a table or on a bed. Use whatever works the best and is most comfortable for you.    Use music or television while you are exercising so that the exercises are a pleasant break in your day. This will make your life better with the exercises acting as a break in your routine that you can look forward to.   Perform all exercises about fifteen  times, three times per day or as directed.  You should exercise both the operative leg and the other leg as well.  Exercises include:   Quad Sets - Tighten up the muscle on the front of the thigh (Quad) and hold for 5-10 seconds.   Straight Leg Raises - With your knee straight (if you were given a brace, keep it on), lift the leg to 60 degrees, hold for 3 seconds, and slowly lower the leg.  Perform this exercise against resistance later as your leg gets stronger.  Leg Slides: Lying on your back, slowly slide your foot toward your buttocks, bending your knee up off the floor (only go as far as is comfortable). Then slowly slide your foot back down until your leg is flat on the floor again.  Angel Wings: Lying on your back spread your legs to the side as far apart as you can without causing discomfort.  Hamstring Strength:  Lying on your back, push your heel against the floor with your leg straight by tightening up the muscles of your buttocks.  Repeat, but this time bend your knee to a comfortable angle, and push your heel against the floor.  You may put a pillow under the heel to make it more comfortable if necessary.   A rehabilitation program following joint replacement surgery can speed recovery and prevent re-injury in the future due to weakened muscles. Contact your doctor or a physical therapist for more information on knee rehabilitation.     CONSTIPATION  Constipation is defined medically as fewer than three stools per week and severe constipation as less than one stool per week.  Even if you have a regular bowel pattern at home, your normal regimen is likely to be disrupted due to multiple reasons following surgery.  Combination of anesthesia, postoperative narcotics, change in appetite and fluid intake all can affect your bowels.   YOU MUST use at least one of the following options; they are listed in order of increasing strength to get the job done.  They are all available over the counter, and you may need to use some, POSSIBLY even all of these options:    Drink plenty of fluids (prune juice may be helpful) and high fiber foods Colace 100 mg by mouth twice a day  Senokot for constipation as directed and as needed Dulcolax (bisacodyl), take with full glass of water  Miralax (polyethylene glycol) once or twice a day as needed.  If you have tried all these things and are unable to have a bowel movement in the first 3-4 days after surgery call either your surgeon or your primary doctor.    If you experience loose stools or diarrhea, hold the medications until you stool forms back up.  If your symptoms do not get better within 1 week or if they get worse, check with your doctor.  If you experience "the worst abdominal pain ever" or develop nausea or vomiting, please contact the office immediately for further recommendations for treatment.   ITCHING:  If you experience itching with your medications, try taking only a single pain pill, or even half a pain pill at a time.  You can also use Benadryl over the counter for itching or also to help with sleep.   TED HOSE STOCKINGS:  Use stockings on both legs until for at least 2 weeks or as directed by physician office. They may be removed at night for sleeping.  MEDICATIONS:  See your medication summary on the "After  Visit Summary" that nursing will review with you.  You may have some  home medications which will be placed on hold until you complete the course of blood thinner medication.  It is important for you to complete the blood thinner medication as prescribed.   Blood clot prevention (DVT Prophylaxis): After surgery you are at an increased risk for a blood clot. you were prescribed a blood thinner, Aspirin 38m, to be taken twice daily for a total of 4 weeks from surgery to help reduce your risk of getting a blood clot. This will help prevent a blood clot. Signs of a pulmonary embolus (blood clot in the lungs) include sudden short of breath, feeling lightheaded or dizzy, chest pain with a deep breath, rapid pulse rapid breathing. Signs of a blood clot in your arms or legs include new unexplained swelling and cramping, warm, red or darkened skin around the painful area. Please call the office or 911 right away if these signs or symptoms develop.  PRECAUTIONS:  If you experience chest pain or shortness of breath - call 911 immediately for transfer to the hospital emergency department.   If you develop a fever greater that 101 F, purulent drainage from wound, increased redness or drainage from wound, foul odor from the wound/dressing, or calf pain - CONTACT YOUR SURGEON.                                                   FOLLOW-UP APPOINTMENTS:  If you do not already have a post-op appointment, please call the office for an appointment to be seen by your surgeon.  Guidelines for how soon to be seen are listed in your "After Visit Summary", but are typically between 2-3 weeks after surgery.  OTHER INSTRUCTIONS:   Knee Replacement:  Do not place pillow under knee, focus on keeping the knee straight while resting.   DO NOT modify, tear, cut, or change the foam block in any way.  POST-OPERATIVE OPIOID TAPER INSTRUCTIONS: It is important to wean off of your opioid medication as soon as possible. If you do not need pain medication after your surgery it is ok to stop day one. Opioids  include: Codeine, Hydrocodone(Norco, Vicodin), Oxycodone(Percocet, oxycontin) and hydromorphone amongst others.  Long term and even short term use of opiods can cause: Increased pain response Dependence Constipation Depression Respiratory depression And more.  Withdrawal symptoms can include Flu like symptoms Nausea, vomiting And more Techniques to manage these symptoms Hydrate well Eat regular healthy meals Stay active Use relaxation techniques(deep breathing, meditating, yoga) Do Not substitute Alcohol to help with tapering If you have been on opioids for less than two weeks and do not have pain than it is ok to stop all together.  Plan to wean off of opioids This plan should start within one week post op of your joint replacement. Maintain the same interval or time between taking each dose and first decrease the dose.  Cut the total daily intake of opioids by one tablet each day Next start to increase the time between doses. The last dose that should be eliminated is the evening dose.   MAKE SURE YOU:  Understand these instructions.  Get help right away if you are not doing well or get worse.    Thank you for letting [facility] a part of your medical care team.  It is a privilege we respect greatly.  We hope these instructions will help you stay on track for a fast and full recovery!           Signed: Debbrah Sampedro A Hazelyn Kallen 04/04/2022, 12:38 PM

## 2022-04-04 NOTE — Telephone Encounter (Signed)
Patient's wife is requesting call back to discuss results in MyChart for potassium levels. Requesting his labs be checked.

## 2022-04-04 NOTE — Progress Notes (Signed)
Physical Therapy Treatment Patient Details Name: Thomas Mcclure MRN: [id number] DOB: 1955/10/05 Today's Date: 04/04/2022   History of Present Illness 67 y.o. male S/p Revision R TKA 04/02/22. PMH: multiple back surgeries, RA, MI, HTN, CAD, DM    PT Comments    Pt was received in supine and agreeable to therapy. Pt was able to manage RLE with BUE for bed mobility. Pt was able to demonstrate correct sit to stand technique with cues to put RLE forward to reduce pain. Pt was able to demonstrate safe stair technique with cues for LE sequence.  Anticipate safe discharge home with pt and wife able to manage functional mobility needs.    Recommendations for follow up therapy are one component of a multi-disciplinary discharge planning process, led by the attending physician.  Recommendations may be updated based on patient status, additional functional criteria and insurance authorization.  Follow Up Recommendations  Follow physician's recommendations for discharge plan and follow up therapies     Assistance Recommended at Discharge Intermittent Supervision/Assistance  Patient can return home with the following A little help with walking and/or transfers;A little help with bathing/dressing/bathroom;Assistance with cooking/housework;Assist for transportation;Help with stairs or ramp for entrance   Equipment Recommendations  Rolling walker (2 wheels)    Recommendations for Other Services       Precautions / Restrictions Precautions Precautions: Knee;Fall Precaution Booklet Issued: Yes (comment) Restrictions Weight Bearing Restrictions: Yes RLE Weight Bearing: Weight bearing as tolerated     Mobility  Bed Mobility Overal bed mobility: Needs Assistance Bed Mobility: Supine to Sit     Supine to sit: Supervision, HOB elevated     General bed mobility comments: Supervision for safety, pt uses BUE to assist RLE to EOB    Transfers Overall transfer level: Needs assistance Equipment  used: Rolling walker (2 wheels) Transfers: Sit to/from Stand Sit to Stand: Min guard           General transfer comment: from EOB to RW, recliner to RW, and bench to RW with min guard for safety and pt requiring increased time to power up.    Ambulation/Gait Ambulation/Gait assistance: Min guard, Supervision Gait Distance (Feet): 80 Feet Assistive device: Rolling walker (2 wheels) Gait Pattern/deviations: Step-to pattern, Trunk flexed, Antalgic Gait velocity: decr     General Gait Details: Pt given heavy cues for upright posture, but continuing to demonstrate flexed trunk and heavy reliance on RW. Pt demonstrating step-to pattern throughout and limiting WB on RLE due to pain.   Stairs Stairs: Yes Stairs assistance: Min guard Stair Management: With walker Number of Stairs: 2 General stair comments: Pt completed 1 stair x2 in room with walker to simulate 2 handrails pt will use at home. Pt required increased time, cues for sequencing, and min guard for safety.       Balance Overall balance assessment: Needs assistance Sitting-balance support: No upper extremity supported, Feet supported Sitting balance-Leahy Scale: Good Sitting balance - Comments: on EOB   Standing balance support: Bilateral upper extremity supported, During functional activity Standing balance-Leahy Scale: Fair Standing balance comment: with RW support                            Cognition Arousal/Alertness: Awake/alert Behavior During Therapy: WFL for tasks assessed/performed Overall Cognitive Status: Within Functional Limits for tasks assessed  Exercises      General Comments General comments (skin integrity, edema, etc.): Reviewed home set up and use of equipment      Pertinent Vitals/Pain Pain Assessment Pain Assessment: 0-10 Pain Score: 7  Pain Location: Rt knee Pain Descriptors / Indicators: Guarding, Grimacing,  Sore Pain Intervention(s): Limited activity within patient's tolerance, Monitored during session, Ice applied, Repositioned, Premedicated before session     PT Goals (current goals can now be found in the care plan section) Acute Rehab PT Goals Patient Stated Goal: Get well, no more surgery PT Goal Formulation: With patient Time For Goal Achievement: 04/10/22 Potential to Achieve Goals: Good Progress towards PT goals: Progressing toward goals    Frequency    7X/week      PT Plan Current plan remains appropriate       AM-PAC PT "6 Clicks" Mobility   Outcome Measure  Help needed turning from your back to your side while in a flat bed without using bedrails?: None Help needed moving from lying on your back to sitting on the side of a flat bed without using bedrails?: A Little Help needed moving to and from a bed to a chair (including a wheelchair)?: A Little Help needed standing up from a chair using your arms (e.g., wheelchair or bedside chair)?: A Little Help needed to walk in hospital room?: A Little Help needed climbing 3-5 steps with a railing? : A Little 6 Click Score: 19    End of Session Equipment Utilized During Treatment: Gait belt Activity Tolerance: Patient tolerated treatment well Patient left: in chair;with chair alarm set;with call bell/phone within reach Nurse Communication: Mobility status PT Visit Diagnosis: Unsteadiness on feet (R26.81);Other abnormalities of gait and mobility (R26.89);Pain;Difficulty in walking, not elsewhere classified (R26.2) Pain - Right/Left: Right Pain - part of body: Knee     Time: 0820-0914 PT Time Calculation (min) (ACUTE ONLY): 54 min  Charges:  $Gait Training: 38-52 mins $Therapeutic Activity: 8-22 mins                     Michelle Nasuti, PTA Acute Rehabilitation Services Secure Chat Preferred  [phone]    Michelle Nasuti 04/04/2022, 9:40 AM

## 2022-04-04 NOTE — Progress Notes (Signed)
     Subjective: Patient reports pain as moderate.  Added additional '5mg'$  oxy to his PRN meds. Did very well with PT yesterday. Says knee feels solid. Hopeful to go home today if clears PT. Oral potassium added this morning.  Objective:   VITALS:   Vitals:   04/03/22 0736 04/03/22 1423 04/03/22 2033 04/04/22 0441  BP: 131/65 102/70 109/67 130/75  Pulse: 100 82 87 90  Resp: '20 17  17  '$ Temp: 99.5 F (37.5 C) 98.4 F (36.9 C) 98.9 F (37.2 C) 100.2 F (37.9 C)  TempSrc: Oral Oral Oral Oral  SpO2: 97% 91% 95% 95%  Weight:      Height:        Sensation intact distally Intact pulses distally Dorsiflexion/Plantar flexion intact Incision: prevena wound vacdressing holding suction, no output in cannsiter Compartment soft   Lab Results  Component Value Date   WBC 13.1 (H) 04/04/2022   HGB 10.2 (L) 04/04/2022   HCT 31.8 (L) 04/04/2022   MCV 77.0 (L) 04/04/2022   PLT 188 04/04/2022   BMET    Component Value Date/Time   NA 134 (L) 04/04/2022 0317   NA 136 10/06/2018 0948   K 3.1 (L) 04/04/2022 0317   CL 92 (L) 04/04/2022 0317   CO2 29 04/04/2022 0317   GLUCOSE 106 (H) 04/04/2022 0317   BUN 11 04/04/2022 0317   BUN 13 10/06/2018 0948   CREATININE 0.94 04/04/2022 0317   CREATININE 0.89 02/13/2016 0931   CALCIUM 8.6 (L) 04/04/2022 0317   GFRNONAA >60 04/04/2022 0317      Xray: post op xrays show TKA components in good position, no adverse features  Assessment/Plan: 2 Days Post-Op   Principal Problem:   Prosthetic joint implant failure, initial encounter ([place])  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 Intra-op cxs NGTD Imaging: PACU xrays DVT prophylaxis: Aspirin '81mg'$  BID x4 weeks Follow up: 2 weeks after surgery for a wound check with Dr. Zachery Dakins at [facility].  Address: [address] [employer], [place],  [place] [address]  Office Phone: [phone]   Willaim Sheng 04/04/2022, 7:32 AM   Charlies Constable, MD  Contact information:   [phone] 7am-5pm epic message Dr. Zachery Dakins, or call office for patient follow up: [phone] After hours and holidays please check [email or web address] for group call information for [facility]

## 2022-04-07 ENCOUNTER — Other Ambulatory Visit (HOSPITAL_COMMUNITY): Payer: Self-pay

## 2022-04-07 LAB — AEROBIC/ANAEROBIC CULTURE W GRAM STAIN (SURGICAL/DEEP WOUND)
Culture: NO GROWTH
Culture: NO GROWTH
Culture: NO GROWTH
Gram Stain: NONE SEEN
Gram Stain: NONE SEEN
Gram Stain: NONE SEEN

## 2022-04-08 ENCOUNTER — Other Ambulatory Visit (HOSPITAL_COMMUNITY): Payer: Self-pay

## 2022-04-10 ENCOUNTER — Other Ambulatory Visit (HOSPITAL_COMMUNITY): Payer: Self-pay

## 2022-04-10 MED ORDER — OXYCODONE HCL 5 MG PO TABS
5.0000 mg | ORAL_TABLET | Freq: Four times a day (QID) | ORAL | 0 refills | Status: DC | PRN
  Filled 2022-04-10: qty 30, 8d supply, fill #0

## 2022-04-12 ENCOUNTER — Other Ambulatory Visit (HOSPITAL_COMMUNITY): Payer: Self-pay

## 2022-04-13 ENCOUNTER — Other Ambulatory Visit (HOSPITAL_COMMUNITY): Payer: Self-pay

## 2022-04-14 ENCOUNTER — Other Ambulatory Visit (HOSPITAL_COMMUNITY): Payer: Self-pay

## 2022-04-16 ENCOUNTER — Other Ambulatory Visit (HOSPITAL_COMMUNITY): Payer: Self-pay

## 2022-04-16 MED ORDER — OXYCODONE HCL 20 MG PO TABS
20.0000 mg | ORAL_TABLET | ORAL | 0 refills | Status: DC | PRN
  Filled 2022-04-16 – 2022-04-24 (×3): qty 40, 7d supply, fill #0

## 2022-04-16 MED ORDER — POTASSIUM CHLORIDE ER 20 MEQ PO TBCR
1.0000 | EXTENDED_RELEASE_TABLET | Freq: Two times a day (BID) | ORAL | 0 refills | Status: DC
  Filled 2022-04-16: qty 14, 7d supply, fill #0

## 2022-04-17 ENCOUNTER — Other Ambulatory Visit (HOSPITAL_COMMUNITY): Payer: Self-pay

## 2022-04-17 ENCOUNTER — Other Ambulatory Visit: Payer: Self-pay

## 2022-04-18 ENCOUNTER — Other Ambulatory Visit: Payer: Self-pay

## 2022-04-18 ENCOUNTER — Other Ambulatory Visit (HOSPITAL_COMMUNITY): Payer: Self-pay

## 2022-04-19 ENCOUNTER — Other Ambulatory Visit (HOSPITAL_COMMUNITY): Payer: Self-pay

## 2022-04-21 ENCOUNTER — Other Ambulatory Visit (HOSPITAL_COMMUNITY): Payer: Self-pay

## 2022-04-21 MED ORDER — METHOCARBAMOL 500 MG PO TABS
500.0000 mg | ORAL_TABLET | Freq: Three times a day (TID) | ORAL | 0 refills | Status: DC
  Filled 2022-04-21: qty 40, 14d supply, fill #0

## 2022-04-22 ENCOUNTER — Other Ambulatory Visit (HOSPITAL_COMMUNITY): Payer: Self-pay

## 2022-04-22 ENCOUNTER — Other Ambulatory Visit: Payer: Self-pay

## 2022-04-22 MED ORDER — OXYCODONE HCL 5 MG PO TABS
5.0000 mg | ORAL_TABLET | ORAL | 0 refills | Status: DC | PRN
  Filled 2022-04-22: qty 40, 7d supply, fill #0

## 2022-04-22 MED ORDER — PREGABALIN 150 MG PO CAPS
150.0000 mg | ORAL_CAPSULE | Freq: Two times a day (BID) | ORAL | 0 refills | Status: DC
  Filled 2022-05-05 – 2022-05-09 (×3): qty 60, 30d supply, fill #0

## 2022-04-22 MED ORDER — BELBUCA 900 MCG BU FILM
ORAL_FILM | BUCCAL | 0 refills | Status: DC
  Filled 2022-04-22: qty 60, 30d supply, fill #0
  Filled 2022-04-22: qty 60, fill #0

## 2022-04-22 MED ORDER — OXYCODONE HCL 20 MG PO TABS
20.0000 mg | ORAL_TABLET | ORAL | 0 refills | Status: DC | PRN
  Filled 2022-05-13 – 2022-06-21 (×6): qty 40, 7d supply, fill #0
  Filled ????-??-??: fill #0

## 2022-04-22 MED ORDER — OXYCODONE HCL 20 MG PO TABS
20.0000 mg | ORAL_TABLET | Freq: Four times a day (QID) | ORAL | 0 refills | Status: DC | PRN
  Filled 2022-04-29: qty 120, 30d supply, fill #0

## 2022-04-23 ENCOUNTER — Other Ambulatory Visit (HOSPITAL_COMMUNITY): Payer: Self-pay

## 2022-04-24 ENCOUNTER — Other Ambulatory Visit (HOSPITAL_COMMUNITY): Payer: Self-pay

## 2022-04-25 ENCOUNTER — Other Ambulatory Visit (HOSPITAL_COMMUNITY): Payer: Self-pay

## 2022-04-26 ENCOUNTER — Other Ambulatory Visit (HOSPITAL_COMMUNITY): Payer: Self-pay

## 2022-04-27 ENCOUNTER — Other Ambulatory Visit (HOSPITAL_COMMUNITY): Payer: Self-pay

## 2022-04-28 ENCOUNTER — Other Ambulatory Visit (HOSPITAL_COMMUNITY): Payer: Self-pay

## 2022-04-28 ENCOUNTER — Other Ambulatory Visit: Payer: Self-pay

## 2022-04-28 NOTE — Addendum Note (Signed)
Addendum  created 04/28/22 1048 by Annye Asa, MD   Clinical Note Signed, Intraprocedure Blocks edited, SmartForm saved

## 2022-04-29 ENCOUNTER — Other Ambulatory Visit (HOSPITAL_COMMUNITY): Payer: Self-pay

## 2022-04-30 ENCOUNTER — Other Ambulatory Visit (HOSPITAL_COMMUNITY): Payer: Self-pay

## 2022-05-02 ENCOUNTER — Other Ambulatory Visit (HOSPITAL_COMMUNITY): Payer: Self-pay

## 2022-05-03 ENCOUNTER — Other Ambulatory Visit (HOSPITAL_COMMUNITY): Payer: Self-pay

## 2022-05-05 ENCOUNTER — Other Ambulatory Visit (HOSPITAL_COMMUNITY): Payer: Self-pay

## 2022-05-07 ENCOUNTER — Other Ambulatory Visit (HOSPITAL_COMMUNITY): Payer: Self-pay

## 2022-05-07 ENCOUNTER — Other Ambulatory Visit: Payer: Self-pay

## 2022-05-09 ENCOUNTER — Other Ambulatory Visit: Payer: Self-pay

## 2022-05-09 ENCOUNTER — Other Ambulatory Visit (HOSPITAL_COMMUNITY): Payer: Self-pay

## 2022-05-13 ENCOUNTER — Other Ambulatory Visit (HOSPITAL_COMMUNITY): Payer: Self-pay

## 2022-05-13 MED ORDER — METHOCARBAMOL 500 MG PO TABS
500.0000 mg | ORAL_TABLET | Freq: Four times a day (QID) | ORAL | 0 refills | Status: DC | PRN
  Filled 2022-05-13: qty 30, 8d supply, fill #0

## 2022-05-13 MED ORDER — OXYCODONE HCL 5 MG PO TABS
ORAL_TABLET | ORAL | 0 refills | Status: DC
  Filled 2022-05-13: qty 40, 7d supply, fill #0

## 2022-05-16 ENCOUNTER — Other Ambulatory Visit: Payer: Self-pay

## 2022-05-16 ENCOUNTER — Other Ambulatory Visit (HOSPITAL_COMMUNITY): Payer: Self-pay

## 2022-05-17 ENCOUNTER — Other Ambulatory Visit (HOSPITAL_COMMUNITY): Payer: Self-pay

## 2022-05-19 ENCOUNTER — Other Ambulatory Visit (HOSPITAL_COMMUNITY): Payer: Self-pay

## 2022-05-20 ENCOUNTER — Other Ambulatory Visit (HOSPITAL_COMMUNITY): Payer: Self-pay

## 2022-05-21 ENCOUNTER — Other Ambulatory Visit: Payer: Self-pay

## 2022-05-21 ENCOUNTER — Other Ambulatory Visit (HOSPITAL_COMMUNITY): Payer: Self-pay

## 2022-05-21 MED ORDER — OMEPRAZOLE 40 MG PO CPDR
40.0000 mg | DELAYED_RELEASE_CAPSULE | Freq: Every day | ORAL | 0 refills | Status: DC
  Filled 2022-05-21: qty 90, 90d supply, fill #0

## 2022-05-21 MED ORDER — BELBUCA 900 MCG BU FILM
ORAL_FILM | BUCCAL | 0 refills | Status: DC
  Filled 2022-05-21 – 2022-05-23 (×2): qty 60, 30d supply, fill #0

## 2022-05-21 MED ORDER — OXYCODONE HCL 20 MG PO TABS
ORAL_TABLET | ORAL | 0 refills | Status: DC
  Filled 2022-05-29 (×2): qty 120, 30d supply, fill #0

## 2022-05-21 MED ORDER — PREGABALIN 150 MG PO CAPS
150.0000 mg | ORAL_CAPSULE | Freq: Two times a day (BID) | ORAL | 0 refills | Status: DC
  Filled 2022-06-02 – 2022-06-09 (×2): qty 60, 30d supply, fill #0

## 2022-05-22 ENCOUNTER — Other Ambulatory Visit (HOSPITAL_COMMUNITY): Payer: Self-pay

## 2022-05-23 ENCOUNTER — Other Ambulatory Visit (HOSPITAL_COMMUNITY): Payer: Self-pay

## 2022-05-23 ENCOUNTER — Other Ambulatory Visit: Payer: Self-pay

## 2022-05-26 ENCOUNTER — Other Ambulatory Visit (HOSPITAL_COMMUNITY): Payer: Self-pay

## 2022-05-27 ENCOUNTER — Other Ambulatory Visit: Payer: Self-pay

## 2022-05-27 ENCOUNTER — Other Ambulatory Visit (HOSPITAL_COMMUNITY): Payer: Self-pay

## 2022-05-28 ENCOUNTER — Other Ambulatory Visit: Payer: Self-pay

## 2022-05-28 ENCOUNTER — Other Ambulatory Visit (HOSPITAL_COMMUNITY): Payer: Self-pay

## 2022-05-29 ENCOUNTER — Other Ambulatory Visit (HOSPITAL_COMMUNITY): Payer: Self-pay

## 2022-06-02 ENCOUNTER — Other Ambulatory Visit (HOSPITAL_COMMUNITY): Payer: Self-pay

## 2022-06-03 ENCOUNTER — Other Ambulatory Visit (HOSPITAL_COMMUNITY): Payer: Self-pay

## 2022-06-03 MED ORDER — METFORMIN HCL 1000 MG PO TABS
1000.0000 mg | ORAL_TABLET | Freq: Two times a day (BID) | ORAL | 1 refills | Status: DC
  Filled 2022-06-04: qty 180, 90d supply, fill #0
  Filled 2022-09-09 – 2022-10-29 (×6): qty 180, 90d supply, fill #1

## 2022-06-03 MED ORDER — METHOCARBAMOL 750 MG PO TABS
750.0000 mg | ORAL_TABLET | Freq: Two times a day (BID) | ORAL | 4 refills | Status: DC | PRN
  Filled 2022-06-03: qty 60, 30d supply, fill #0
  Filled 2022-07-24: qty 60, 30d supply, fill #1
  Filled 2022-08-25: qty 60, 30d supply, fill #2
  Filled 2022-09-25: qty 60, 30d supply, fill #3
  Filled 2022-11-03: qty 60, 30d supply, fill #4

## 2022-06-03 MED ORDER — LINZESS 145 MCG PO CAPS
145.0000 ug | ORAL_CAPSULE | Freq: Every day | ORAL | 3 refills | Status: DC
  Filled 2022-06-18 – 2022-08-11 (×4): qty 90, 90d supply, fill #0
  Filled 2022-11-10: qty 90, 90d supply, fill #1

## 2022-06-03 MED ORDER — LISINOPRIL 20 MG PO TABS
20.0000 mg | ORAL_TABLET | Freq: Every day | ORAL | 1 refills | Status: DC
  Filled 2022-06-03 – 2022-10-29 (×2): qty 90, 90d supply, fill #0
  Filled 2023-02-03: qty 90, 90d supply, fill #1

## 2022-06-03 MED ORDER — OMEPRAZOLE 40 MG PO CPDR
40.0000 mg | DELAYED_RELEASE_CAPSULE | Freq: Every day | ORAL | 0 refills | Status: DC
  Filled 2022-06-03 – 2022-08-11 (×7): qty 90, 90d supply, fill #0

## 2022-06-03 MED ORDER — DAPAGLIFLOZIN PROPANEDIOL 10 MG PO TABS
10.0000 mg | ORAL_TABLET | Freq: Every day | ORAL | 0 refills | Status: DC
  Filled 2022-06-24: qty 90, 90d supply, fill #0

## 2022-06-03 MED ORDER — ATORVASTATIN CALCIUM 10 MG PO TABS
10.0000 mg | ORAL_TABLET | Freq: Every day | ORAL | 1 refills | Status: DC
  Filled 2022-06-03 (×2): qty 90, 90d supply, fill #0
  Filled 2022-09-09 – 2023-02-09 (×8): qty 90, 90d supply, fill #1

## 2022-06-03 MED ORDER — GLIPIZIDE ER 10 MG PO TB24
20.0000 mg | ORAL_TABLET | Freq: Every day | ORAL | 1 refills | Status: DC
  Filled 2022-06-03 – 2022-06-24 (×2): qty 90, 45d supply, fill #0
  Filled 2022-08-11 – 2022-11-10 (×3): qty 90, 45d supply, fill #1

## 2022-06-03 MED ORDER — POTASSIUM CHLORIDE ER 20 MEQ PO TBCR
1.0000 | EXTENDED_RELEASE_TABLET | Freq: Every day | ORAL | 0 refills | Status: DC
  Filled 2022-06-03: qty 90, 90d supply, fill #0

## 2022-06-03 MED ORDER — FUROSEMIDE 40 MG PO TABS
40.0000 mg | ORAL_TABLET | Freq: Every day | ORAL | 0 refills | Status: DC
  Filled 2022-06-03: qty 5, 5d supply, fill #0

## 2022-06-04 ENCOUNTER — Other Ambulatory Visit (HOSPITAL_COMMUNITY): Payer: Self-pay

## 2022-06-05 ENCOUNTER — Other Ambulatory Visit (HOSPITAL_COMMUNITY): Payer: Self-pay

## 2022-06-09 ENCOUNTER — Other Ambulatory Visit (HOSPITAL_COMMUNITY): Payer: Self-pay

## 2022-06-09 ENCOUNTER — Other Ambulatory Visit: Payer: Self-pay

## 2022-06-10 ENCOUNTER — Other Ambulatory Visit: Payer: Self-pay

## 2022-06-10 ENCOUNTER — Other Ambulatory Visit (HOSPITAL_COMMUNITY): Payer: Self-pay

## 2022-06-18 ENCOUNTER — Other Ambulatory Visit (HOSPITAL_COMMUNITY): Payer: Self-pay

## 2022-06-18 ENCOUNTER — Other Ambulatory Visit: Payer: Self-pay

## 2022-06-20 ENCOUNTER — Other Ambulatory Visit (HOSPITAL_COMMUNITY): Payer: Self-pay

## 2022-06-21 ENCOUNTER — Other Ambulatory Visit (HOSPITAL_COMMUNITY): Payer: Self-pay

## 2022-06-21 MED ORDER — BELBUCA 900 MCG BU FILM
900.0000 ug | ORAL_FILM | Freq: Two times a day (BID) | BUCCAL | 0 refills | Status: DC
  Filled 2022-06-21 – 2022-06-23 (×2): qty 60, 30d supply, fill #0

## 2022-06-21 MED ORDER — OXYCODONE HCL 20 MG PO TABS
20.0000 mg | ORAL_TABLET | Freq: Four times a day (QID) | ORAL | 0 refills | Status: DC | PRN
  Filled 2022-06-28: qty 120, 30d supply, fill #0
  Filled ????-??-??: fill #0

## 2022-06-21 MED ORDER — PREGABALIN 150 MG PO CAPS
150.0000 mg | ORAL_CAPSULE | Freq: Two times a day (BID) | ORAL | 0 refills | Status: DC
  Filled 2022-06-21 – 2022-07-09 (×3): qty 60, 30d supply, fill #0

## 2022-06-23 ENCOUNTER — Other Ambulatory Visit (HOSPITAL_COMMUNITY): Payer: Self-pay

## 2022-06-24 ENCOUNTER — Other Ambulatory Visit: Payer: Self-pay

## 2022-06-24 ENCOUNTER — Other Ambulatory Visit (HOSPITAL_COMMUNITY): Payer: Self-pay

## 2022-06-24 MED ORDER — OXYCODONE HCL 5 MG PO TABS
5.0000 mg | ORAL_TABLET | ORAL | 0 refills | Status: DC | PRN
  Filled 2022-06-24: qty 30, 5d supply, fill #0

## 2022-06-25 ENCOUNTER — Other Ambulatory Visit (HOSPITAL_COMMUNITY): Payer: Self-pay

## 2022-06-25 ENCOUNTER — Other Ambulatory Visit: Payer: Self-pay

## 2022-06-27 ENCOUNTER — Other Ambulatory Visit (HOSPITAL_COMMUNITY): Payer: Self-pay

## 2022-06-28 ENCOUNTER — Other Ambulatory Visit (HOSPITAL_COMMUNITY): Payer: Self-pay

## 2022-07-03 ENCOUNTER — Other Ambulatory Visit (HOSPITAL_COMMUNITY): Payer: Self-pay

## 2022-07-03 MED ORDER — DICLOFENAC SODIUM 1 % EX GEL
CUTANEOUS | 2 refills | Status: DC
  Filled 2022-07-03: qty 200, 20d supply, fill #0
  Filled 2022-09-25: qty 200, 20d supply, fill #1
  Filled 2022-11-23: qty 200, 20d supply, fill #2

## 2022-07-07 ENCOUNTER — Other Ambulatory Visit: Payer: Self-pay

## 2022-07-07 ENCOUNTER — Other Ambulatory Visit (HOSPITAL_COMMUNITY): Payer: Self-pay

## 2022-07-07 MED ORDER — ATORVASTATIN CALCIUM 10 MG PO TABS
10.0000 mg | ORAL_TABLET | Freq: Every evening | ORAL | 1 refills | Status: DC
  Filled 2022-07-24 – 2022-08-09 (×2): qty 90, 90d supply, fill #0
  Filled 2022-11-10: qty 90, 90d supply, fill #1

## 2022-07-07 MED ORDER — OMEPRAZOLE 40 MG PO CPDR: 40.0000 mg | DELAYED_RELEASE_CAPSULE | Freq: Every day | ORAL | 0 refills | Status: DC

## 2022-07-07 MED ORDER — DULOXETINE HCL 60 MG PO CPEP
60.0000 mg | ORAL_CAPSULE | Freq: Every day | ORAL | 1 refills | Status: DC
  Filled 2022-09-11: qty 90, 90d supply, fill #0

## 2022-07-07 MED ORDER — CARVEDILOL 12.5 MG PO TABS
12.5000 mg | ORAL_TABLET | Freq: Two times a day (BID) | ORAL | 1 refills | Status: DC
  Filled 2022-07-07 – 2022-08-09 (×2): qty 180, 90d supply, fill #0
  Filled 2022-11-10: qty 180, 90d supply, fill #1

## 2022-07-07 MED ORDER — POTASSIUM CHLORIDE ER 20 MEQ PO TBCR
1.0000 | EXTENDED_RELEASE_TABLET | Freq: Every day | ORAL | 0 refills | Status: DC
  Filled 2022-09-09: qty 90, 90d supply, fill #0

## 2022-07-07 MED ORDER — FUROSEMIDE 40 MG PO TABS
40.0000 mg | ORAL_TABLET | Freq: Every day | ORAL | 0 refills | Status: DC
  Filled 2022-07-07: qty 5, 5d supply, fill #0

## 2022-07-07 MED ORDER — DAPAGLIFLOZIN PROPANEDIOL 10 MG PO TABS: 10.0000 mg | ORAL_TABLET | Freq: Every day | ORAL | 0 refills | Status: DC

## 2022-07-07 MED ORDER — GLIPIZIDE ER 10 MG PO TB24
20.0000 mg | ORAL_TABLET | Freq: Every day | ORAL | 1 refills | Status: DC
  Filled 2022-08-11: qty 90, 45d supply, fill #0
  Filled 2022-09-25: qty 90, 45d supply, fill #1

## 2022-07-07 MED ORDER — LUBIPROSTONE 24 MCG PO CAPS
24.0000 ug | ORAL_CAPSULE | Freq: Two times a day (BID) | ORAL | 0 refills | Status: DC
  Filled 2022-07-07: qty 150, 75d supply, fill #0
  Filled 2022-07-07: qty 120, 60d supply, fill #0
  Filled 2022-07-07: qty 60, 30d supply, fill #0

## 2022-07-08 ENCOUNTER — Other Ambulatory Visit (HOSPITAL_COMMUNITY): Payer: Self-pay

## 2022-07-08 MED ORDER — OMEPRAZOLE 40 MG PO CPDR
40.0000 mg | DELAYED_RELEASE_CAPSULE | Freq: Every day | ORAL | 0 refills | Status: DC
  Filled 2022-11-10: qty 90, 90d supply, fill #0

## 2022-07-08 MED ORDER — DULOXETINE HCL 60 MG PO CPEP
60.0000 mg | ORAL_CAPSULE | Freq: Every day | ORAL | 1 refills | Status: DC
  Filled 2022-09-09: qty 90, 90d supply, fill #0
  Filled 2022-12-08: qty 90, 90d supply, fill #1

## 2022-07-09 ENCOUNTER — Other Ambulatory Visit: Payer: Self-pay

## 2022-07-09 ENCOUNTER — Other Ambulatory Visit (HOSPITAL_COMMUNITY): Payer: Self-pay

## 2022-07-22 ENCOUNTER — Other Ambulatory Visit (HOSPITAL_COMMUNITY): Payer: Self-pay

## 2022-07-22 MED ORDER — BELBUCA 900 MCG BU FILM
ORAL_FILM | BUCCAL | 0 refills | Status: DC
  Filled 2022-07-22: qty 60, 30d supply, fill #0

## 2022-07-22 MED ORDER — OXYCODONE HCL 20 MG PO TABS
20.0000 mg | ORAL_TABLET | Freq: Four times a day (QID) | ORAL | 0 refills | Status: DC | PRN
  Filled 2022-07-22 – 2022-07-28 (×2): qty 120, 30d supply, fill #0

## 2022-07-22 MED ORDER — PREGABALIN 150 MG PO CAPS
ORAL_CAPSULE | ORAL | 0 refills | Status: DC
  Filled 2022-07-22: qty 60, 30d supply, fill #0
  Filled 2022-07-24: qty 60, fill #0
  Filled 2022-08-09: qty 60, 30d supply, fill #0

## 2022-07-24 ENCOUNTER — Other Ambulatory Visit (HOSPITAL_COMMUNITY): Payer: Self-pay

## 2022-07-24 ENCOUNTER — Other Ambulatory Visit: Payer: Self-pay

## 2022-07-25 ENCOUNTER — Other Ambulatory Visit: Payer: Self-pay

## 2022-07-26 ENCOUNTER — Other Ambulatory Visit (HOSPITAL_COMMUNITY): Payer: Self-pay

## 2022-07-28 ENCOUNTER — Other Ambulatory Visit (HOSPITAL_COMMUNITY): Payer: Self-pay

## 2022-07-28 ENCOUNTER — Other Ambulatory Visit: Payer: Self-pay

## 2022-07-31 ENCOUNTER — Other Ambulatory Visit (HOSPITAL_COMMUNITY): Payer: Self-pay

## 2022-08-11 ENCOUNTER — Other Ambulatory Visit (HOSPITAL_COMMUNITY): Payer: Self-pay

## 2022-08-11 ENCOUNTER — Other Ambulatory Visit: Payer: Self-pay

## 2022-08-12 ENCOUNTER — Other Ambulatory Visit: Payer: Self-pay

## 2022-08-22 ENCOUNTER — Other Ambulatory Visit (HOSPITAL_COMMUNITY): Payer: Self-pay

## 2022-08-22 MED ORDER — PREGABALIN 150 MG PO CAPS
150.0000 mg | ORAL_CAPSULE | Freq: Two times a day (BID) | ORAL | 0 refills | Status: DC
  Filled 2022-09-10: qty 60, 30d supply, fill #0

## 2022-08-22 MED ORDER — BELBUCA 900 MCG BU FILM
1.0000 | ORAL_FILM | Freq: Two times a day (BID) | BUCCAL | 0 refills | Status: DC
  Filled 2022-08-22: qty 60, 30d supply, fill #0

## 2022-08-22 MED ORDER — OXYCODONE HCL 20 MG PO TABS
20.0000 mg | ORAL_TABLET | Freq: Four times a day (QID) | ORAL | 0 refills | Status: DC | PRN
  Filled 2022-08-22 – 2022-08-25 (×2): qty 120, 30d supply, fill #0

## 2022-08-25 ENCOUNTER — Other Ambulatory Visit: Payer: Self-pay

## 2022-08-25 ENCOUNTER — Other Ambulatory Visit (HOSPITAL_COMMUNITY): Payer: Self-pay

## 2022-09-09 ENCOUNTER — Other Ambulatory Visit (HOSPITAL_COMMUNITY): Payer: Self-pay

## 2022-09-09 ENCOUNTER — Other Ambulatory Visit: Payer: Self-pay

## 2022-09-10 ENCOUNTER — Other Ambulatory Visit (HOSPITAL_COMMUNITY): Payer: Self-pay

## 2022-09-10 ENCOUNTER — Other Ambulatory Visit: Payer: Self-pay

## 2022-09-11 ENCOUNTER — Other Ambulatory Visit (HOSPITAL_COMMUNITY): Payer: Self-pay

## 2022-09-11 ENCOUNTER — Other Ambulatory Visit: Payer: Self-pay

## 2022-09-12 ENCOUNTER — Other Ambulatory Visit (HOSPITAL_COMMUNITY): Payer: Self-pay

## 2022-09-13 ENCOUNTER — Other Ambulatory Visit (HOSPITAL_COMMUNITY): Payer: Self-pay

## 2022-09-18 ENCOUNTER — Other Ambulatory Visit (HOSPITAL_COMMUNITY): Payer: Self-pay

## 2022-09-18 MED ORDER — BELBUCA 900 MCG BU FILM
ORAL_FILM | BUCCAL | 0 refills | Status: DC
  Filled 2022-11-19: qty 60, 30d supply, fill #0

## 2022-09-18 MED ORDER — OXYCODONE HCL 20 MG PO TABS
20.0000 mg | ORAL_TABLET | Freq: Four times a day (QID) | ORAL | 0 refills | Status: DC | PRN
  Filled 2022-09-18 – 2022-09-22 (×2): qty 120, 30d supply, fill #0
  Filled 2022-09-24: qty 4, 1d supply, fill #0
  Filled 2022-09-24: qty 116, 29d supply, fill #0

## 2022-09-18 MED ORDER — PREGABALIN 150 MG PO CAPS
150.0000 mg | ORAL_CAPSULE | Freq: Two times a day (BID) | ORAL | 0 refills | Status: DC
  Filled 2022-09-25 – 2022-10-10 (×4): qty 60, 30d supply, fill #0

## 2022-09-18 MED ORDER — BELBUCA 900 MCG BU FILM
ORAL_FILM | BUCCAL | 0 refills | Status: DC
  Filled 2022-09-20 – 2022-09-22 (×2): qty 60, 30d supply, fill #0

## 2022-09-20 ENCOUNTER — Other Ambulatory Visit (HOSPITAL_COMMUNITY): Payer: Self-pay

## 2022-09-22 ENCOUNTER — Other Ambulatory Visit (HOSPITAL_COMMUNITY): Payer: Self-pay

## 2022-09-23 ENCOUNTER — Other Ambulatory Visit (HOSPITAL_COMMUNITY): Payer: Self-pay

## 2022-09-23 ENCOUNTER — Other Ambulatory Visit: Payer: Self-pay

## 2022-09-23 MED ORDER — LUBIPROSTONE 24 MCG PO CAPS
24.0000 ug | ORAL_CAPSULE | Freq: Two times a day (BID) | ORAL | 0 refills | Status: DC
  Filled 2022-09-23: qty 180, 90d supply, fill #0

## 2022-09-24 ENCOUNTER — Other Ambulatory Visit (HOSPITAL_COMMUNITY): Payer: Self-pay

## 2022-09-25 ENCOUNTER — Other Ambulatory Visit: Payer: Self-pay

## 2022-09-25 ENCOUNTER — Other Ambulatory Visit (HOSPITAL_COMMUNITY): Payer: Self-pay

## 2022-09-30 ENCOUNTER — Other Ambulatory Visit (HOSPITAL_COMMUNITY): Payer: Self-pay

## 2022-10-01 ENCOUNTER — Other Ambulatory Visit (HOSPITAL_COMMUNITY): Payer: Self-pay

## 2022-10-08 ENCOUNTER — Other Ambulatory Visit (HOSPITAL_COMMUNITY): Payer: Self-pay

## 2022-10-08 ENCOUNTER — Other Ambulatory Visit: Payer: Self-pay

## 2022-10-10 ENCOUNTER — Other Ambulatory Visit (HOSPITAL_COMMUNITY): Payer: Self-pay

## 2022-10-16 ENCOUNTER — Other Ambulatory Visit: Payer: Self-pay

## 2022-10-17 ENCOUNTER — Other Ambulatory Visit (HOSPITAL_COMMUNITY): Payer: Self-pay

## 2022-10-17 ENCOUNTER — Other Ambulatory Visit: Payer: Self-pay

## 2022-10-17 MED ORDER — PREGABALIN 150 MG PO CAPS
150.0000 mg | ORAL_CAPSULE | Freq: Two times a day (BID) | ORAL | 0 refills | Status: DC
  Filled 2022-11-06 – 2022-11-10 (×2): qty 60, 30d supply, fill #0

## 2022-10-17 MED ORDER — BELBUCA 900 MCG BU FILM
ORAL_FILM | BUCCAL | 0 refills | Status: DC
  Filled 2022-10-22: qty 60, 30d supply, fill #0

## 2022-10-17 MED ORDER — OXYCODONE HCL 20 MG PO TABS
20.0000 mg | ORAL_TABLET | Freq: Four times a day (QID) | ORAL | 0 refills | Status: DC | PRN
  Filled 2022-10-24: qty 120, 30d supply, fill #0

## 2022-10-20 ENCOUNTER — Other Ambulatory Visit (HOSPITAL_COMMUNITY): Payer: Self-pay

## 2022-10-22 ENCOUNTER — Other Ambulatory Visit (HOSPITAL_COMMUNITY): Payer: Self-pay

## 2022-10-24 ENCOUNTER — Other Ambulatory Visit (HOSPITAL_COMMUNITY): Payer: Self-pay

## 2022-10-29 ENCOUNTER — Other Ambulatory Visit (HOSPITAL_COMMUNITY): Payer: Self-pay

## 2022-10-30 ENCOUNTER — Other Ambulatory Visit (HOSPITAL_COMMUNITY): Payer: Self-pay

## 2022-11-06 ENCOUNTER — Other Ambulatory Visit (HOSPITAL_COMMUNITY): Payer: Self-pay

## 2022-11-10 ENCOUNTER — Other Ambulatory Visit: Payer: Self-pay

## 2022-11-10 ENCOUNTER — Other Ambulatory Visit (HOSPITAL_COMMUNITY): Payer: Self-pay

## 2022-11-10 MED ORDER — CHLORTHALIDONE 25 MG PO TABS
25.0000 mg | ORAL_TABLET | Freq: Every day | ORAL | 0 refills | Status: DC
  Filled 2022-11-10: qty 90, 90d supply, fill #0

## 2022-11-12 ENCOUNTER — Other Ambulatory Visit (HOSPITAL_COMMUNITY): Payer: Self-pay

## 2022-11-12 MED ORDER — CHLORHEXIDINE GLUCONATE 0.12 % MT SOLN
OROMUCOSAL | 0 refills | Status: DC
  Filled 2022-11-12: qty 473, 14d supply, fill #0

## 2022-11-12 MED ORDER — GLIPIZIDE ER 10 MG PO TB24
20.0000 mg | ORAL_TABLET | Freq: Every day | ORAL | 1 refills | Status: DC
  Filled 2022-11-12 – 2023-01-09 (×2): qty 90, 45d supply, fill #0
  Filled 2023-02-26: qty 90, 45d supply, fill #1

## 2022-11-12 MED ORDER — POTASSIUM CHLORIDE ER 20 MEQ PO TBCR
1.0000 | EXTENDED_RELEASE_TABLET | Freq: Every day | ORAL | 0 refills | Status: DC
  Filled 2022-11-12: qty 90, 90d supply, fill #0

## 2022-11-12 MED ORDER — LUBIPROSTONE 24 MCG PO CAPS
24.0000 ug | ORAL_CAPSULE | Freq: Two times a day (BID) | ORAL | 0 refills | Status: DC
  Filled 2022-11-12: qty 180, 90d supply, fill #0

## 2022-11-12 MED ORDER — METHOCARBAMOL 750 MG PO TABS
750.0000 mg | ORAL_TABLET | Freq: Two times a day (BID) | ORAL | 4 refills | Status: AC | PRN
  Filled 2022-11-12 – 2022-12-08 (×2): qty 60, 30d supply, fill #0
  Filled 2023-01-29: qty 60, 30d supply, fill #1
  Filled 2023-03-12: qty 60, 30d supply, fill #2
  Filled 2023-05-24 – 2023-05-25 (×2): qty 60, 30d supply, fill #3
  Filled 2023-07-16: qty 60, 30d supply, fill #4

## 2022-11-12 MED ORDER — LINZESS 145 MCG PO CAPS
145.0000 ug | ORAL_CAPSULE | Freq: Every day | ORAL | 3 refills | Status: AC
  Filled 2022-11-12 – 2023-02-09 (×2): qty 90, 90d supply, fill #0
  Filled 2023-05-07: qty 90, 90d supply, fill #1
  Filled 2023-08-07 – 2023-08-08 (×2): qty 90, 90d supply, fill #2
  Filled 2023-11-07: qty 90, 90d supply, fill #3

## 2022-11-12 MED ORDER — ATORVASTATIN CALCIUM 10 MG PO TABS
10.0000 mg | ORAL_TABLET | Freq: Every day | ORAL | 1 refills | Status: DC
  Filled 2022-11-12 – 2022-12-08 (×3): qty 90, 90d supply, fill #0

## 2022-11-12 MED ORDER — AZITHROMYCIN 250 MG PO TABS
ORAL_TABLET | ORAL | 0 refills | Status: AC
  Filled 2022-11-12: qty 6, 5d supply, fill #0

## 2022-11-12 MED ORDER — DAPAGLIFLOZIN PROPANEDIOL 10 MG PO TABS
10.0000 mg | ORAL_TABLET | Freq: Every day | ORAL | 0 refills | Status: DC
  Filled 2022-11-12: qty 90, 90d supply, fill #0

## 2022-11-12 MED ORDER — OMEPRAZOLE 40 MG PO CPDR
40.0000 mg | DELAYED_RELEASE_CAPSULE | Freq: Every day | ORAL | 0 refills | Status: DC
  Filled 2022-11-12 – 2023-02-09 (×2): qty 90, 90d supply, fill #0

## 2022-11-12 MED ORDER — METFORMIN HCL 1000 MG PO TABS
1000.0000 mg | ORAL_TABLET | Freq: Two times a day (BID) | ORAL | 1 refills | Status: DC
  Filled 2022-11-12 – 2023-02-03 (×2): qty 180, 90d supply, fill #0
  Filled 2023-05-04: qty 180, 90d supply, fill #1

## 2022-11-17 ENCOUNTER — Other Ambulatory Visit (HOSPITAL_COMMUNITY): Payer: Self-pay

## 2022-11-18 ENCOUNTER — Other Ambulatory Visit (HOSPITAL_COMMUNITY): Payer: Self-pay

## 2022-11-18 MED ORDER — PREGABALIN 150 MG PO CAPS
150.0000 mg | ORAL_CAPSULE | Freq: Two times a day (BID) | ORAL | 0 refills | Status: DC
  Filled 2022-12-07: qty 60, 30d supply, fill #0
  Filled ????-??-??: fill #0

## 2022-11-18 MED ORDER — OXYCODONE HCL 20 MG PO TABS
20.0000 mg | ORAL_TABLET | Freq: Four times a day (QID) | ORAL | 0 refills | Status: DC | PRN
  Filled 2022-11-24 (×2): qty 120, 30d supply, fill #0
  Filled ????-??-??: fill #0

## 2022-11-18 MED ORDER — BELBUCA 900 MCG BU FILM: ORAL_FILM | BUCCAL | 0 refills | Status: DC

## 2022-11-19 ENCOUNTER — Other Ambulatory Visit (HOSPITAL_COMMUNITY): Payer: Self-pay

## 2022-11-20 ENCOUNTER — Other Ambulatory Visit (HOSPITAL_COMMUNITY): Payer: Self-pay

## 2022-11-22 ENCOUNTER — Other Ambulatory Visit (HOSPITAL_COMMUNITY): Payer: Self-pay

## 2022-11-23 ENCOUNTER — Other Ambulatory Visit (HOSPITAL_COMMUNITY): Payer: Self-pay

## 2022-11-24 ENCOUNTER — Other Ambulatory Visit (HOSPITAL_COMMUNITY): Payer: Self-pay

## 2022-11-24 ENCOUNTER — Other Ambulatory Visit: Payer: Self-pay

## 2022-11-28 ENCOUNTER — Other Ambulatory Visit (HOSPITAL_COMMUNITY): Payer: Self-pay

## 2022-12-07 ENCOUNTER — Other Ambulatory Visit (HOSPITAL_COMMUNITY): Payer: Self-pay

## 2022-12-08 ENCOUNTER — Other Ambulatory Visit: Payer: Self-pay

## 2022-12-08 ENCOUNTER — Telehealth: Payer: Self-pay | Admitting: Cardiology

## 2022-12-08 ENCOUNTER — Other Ambulatory Visit (HOSPITAL_COMMUNITY): Payer: Self-pay

## 2022-12-08 ENCOUNTER — Encounter: Payer: Self-pay | Admitting: Cardiology

## 2022-12-08 ENCOUNTER — Ambulatory Visit: Payer: 59 | Attending: Cardiology | Admitting: Cardiology

## 2022-12-08 VITALS — BP 116/84 | HR 79 | Ht 69.0 in | Wt 245.0 lb

## 2022-12-08 DIAGNOSIS — I10 Essential (primary) hypertension: Secondary | ICD-10-CM | POA: Diagnosis not present

## 2022-12-08 DIAGNOSIS — I5042 Chronic combined systolic (congestive) and diastolic (congestive) heart failure: Secondary | ICD-10-CM | POA: Diagnosis not present

## 2022-12-08 DIAGNOSIS — I723 Aneurysm of iliac artery: Secondary | ICD-10-CM

## 2022-12-08 DIAGNOSIS — I2510 Atherosclerotic heart disease of native coronary artery without angina pectoris: Secondary | ICD-10-CM

## 2022-12-08 MED ORDER — CARVEDILOL 12.5 MG PO TABS
12.5000 mg | ORAL_TABLET | Freq: Two times a day (BID) | ORAL | 3 refills | Status: DC
  Filled 2022-12-08 – 2023-02-09 (×2): qty 180, 90d supply, fill #0
  Filled 2023-05-07: qty 180, 90d supply, fill #1
  Filled 2023-08-07 – 2023-08-08 (×2): qty 180, 90d supply, fill #2
  Filled 2023-11-07: qty 180, 90d supply, fill #3

## 2022-12-08 MED ORDER — CHLORTHALIDONE 25 MG PO TABS
25.0000 mg | ORAL_TABLET | Freq: Every day | ORAL | 3 refills | Status: DC
  Filled 2022-12-08 – 2023-02-09 (×2): qty 90, 90d supply, fill #0
  Filled 2023-05-07: qty 90, 90d supply, fill #1
  Filled 2023-08-07 – 2023-08-08 (×2): qty 90, 90d supply, fill #2
  Filled 2023-11-07: qty 90, 90d supply, fill #3

## 2022-12-08 MED ORDER — DILTIAZEM HCL ER 180 MG PO CP24
180.0000 mg | ORAL_CAPSULE | Freq: Every day | ORAL | 3 refills | Status: DC
  Filled 2022-12-08 – 2023-01-09 (×2): qty 90, 90d supply, fill #0
  Filled 2023-03-12 – 2023-04-08 (×2): qty 90, 90d supply, fill #1
  Filled 2023-07-08: qty 90, 90d supply, fill #2
  Filled 2023-10-18: qty 90, 90d supply, fill #3

## 2022-12-08 NOTE — Progress Notes (Signed)
Cardiology Office Note:  .   Date:  12/08/2022  ID:  Thomas Mcclure, DOB 1955/09/05, MRN [id number] PCP: Bettey Costa, MD  [facility] Providers Cardiologist:  Donato Schultz, MD     History of Present Illness: .   Thomas Mcclure is a 67 y.o. male Discussed with the use of AI scribe   History of Present Illness   The patient, a 68 year old male with a history of non-ST elevation myocardial infarction in 2017, for which a drug-eluting stent was placed in the right coronary artery, presents for follow-up. He also has a history of left ventricular aneurysm, mild reduction in pump function (45-50% in 2020), diabetes, hypertension, hyperlipidemia, and chronic back pain. The patient underwent a spinal stem and in 2018, a follow-up examination of the stent showed it was open and functioning well. 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  The patient reports no current chest pain or shortness of breath. However, he is experiencing difficulty with mobility due to recent knee surgery and ongoing issues with chronic back pain. The patient is on a regimen of carvedilol, Farxiga, and Zestril, which he reports has been effective in managing his conditions. He is also actively involved in his church and enjoys tinkering with electronics in his spare time.          ROS: No CP, no SOB. Uses a cane.   Studies Reviewed: Marland Kitchen   EKG Interpretation Date/Time:  Monday December 08 2022 10:11:35 EDT Ventricular Rate:  77 PR Interval:  166 QRS Duration:  96 QT Interval:  396 QTC Calculation: 448 R Axis:   14  Text Interpretation: Normal sinus rhythm Possible Inferior infarct , age undetermined Non-specific ST-t changes When compared with ECG of 15-Sep-2021 08:20, PREVIOUS ECG IS PRESENT Confirmed by Donato Schultz ([id number]) on 12/08/2022 10:15:52 AM    Results RADIOLOGY CT scan: Left iliac artery aneurysm 1.5 cm (08/2021)   LDL 56 Creat 1.12  Risk Assessment/Calculations:             Physical Exam:   VS:  BP 116/84   Pulse 79   Ht 5\' 9"  (1.753 m)   Wt 245 lb (111.1 kg)   SpO2 95%   BMI 36.18 kg/m    Wt Readings from Last 3 Encounters:  12/08/22 245 lb (111.1 kg)  04/02/22 240 lb (108.9 kg)  03/24/22 245 lb 12.8 oz (111.5 kg)    GEN: Well nourished, well developed in no acute distress NECK: No JVD; No carotid bruits CARDIAC: RRR, no murmurs, no rubs, no gallops RESPIRATORY:  Clear to auscultation without rales, wheezing or rhonchi  ABDOMEN: Soft, non-tender, non-distended EXTREMITIES:  Mild LLE edema; No deformity   ASSESSMENT AND PLAN: .    Assessment and Plan    Coronary Artery Disease History of non-ST elevation myocardial infarction in 2017 with drug-eluting stent placement in the right coronary artery. No current complaints of chest pain or shortness of breath. -Continue current medications including Carvedilol and Lisinopril.  Left Ventricular Aneurysm Noted in 2020, with improved pump function. No current symptoms suggestive of heart failure. -Continue current management.  Diabetes Mellitus No current complaints related to diabetes. -Continue Thomas Mcclure as prescribed.  Hypertension No current complaints suggestive of uncontrolled hypertension. -Continue current antihypertensive regimen including Lisinopril.  Hyperlipidemia No current complaints. -Continue current management.  Chronic Back Pain History of spinal stimulator placement. Current complaints of knee pain post-surgery affecting mobility, but no specific complaints about back pain. -Continue  current management.  Left Iliac Artery Aneurysm Noted on CT scan in July 2023. No current complaints suggestive of complications. -Continue monitoring as per previous plan.  General Health Maintenance -Continue current medications. -Refill prescriptions for 90 days.              Signed, Donato Schultz, MD

## 2022-12-08 NOTE — Telephone Encounter (Signed)
Pt c/o medication issue:  1. Name of Medication: pregabalin (LYRICA) 150 MG capsule   2. How are you currently taking this medication (dosage and times per day)?    3. Are you having a reaction (difficulty breathing--STAT)? no  4. What is your medication issue? Wife called in to say that office has cancel this medication, and dont understand why. States that patient needs this medication. Please advise

## 2022-12-08 NOTE — Patient Instructions (Signed)
Medication Instructions:  Your physician recommends that you continue on your current medications as directed. Please refer to the Current Medication list given to you today.  *If you need a refill on your cardiac medications before your next appointment, please call your pharmacy*   Lab Work: NONE If you have labs (blood work) drawn today and your tests are completely normal, you will receive your results only by: MyChart Message (if you have MyChart) OR A paper copy in the mail If you have any lab test that is abnormal or we need to change your treatment, we will call you to review the results.   Testing/Procedures: NONE   Follow-Up: At [facility], you and your health needs are our priority.  As part of our continuing mission to provide you with exceptional heart care, we have created designated Provider Care Teams.  These Care Teams include your primary Cardiologist (physician) and Advanced Practice Providers (APPs -  Physician Assistants and Nurse Practitioners) who all work together to provide you with the care you need, when you need it.  Your next appointment:   1 year(s)  Provider:   Donato Schultz, MD

## 2022-12-08 NOTE — Telephone Encounter (Signed)
Spoke with wife Zella Ball, [place] per DPR who is frustrated that pt's medication list was altered today at his office visit.  Advised Robin we did not change any of pt's medication at his appointment.  In review of pt's medication list when he arrived today, it demonstrated 4 atorvastatin rxs, 5 Buprenorphine rxs, 3 Dapagliflozin rxs, 2 diclofenac sodium rxs, 2 Duloxetine rxs, 3 glipizide rxs, 3 linaclotide rxs, 2 Lisinopril rxs, Lubiprostone rxs, 2 metformin rxs, 4 Methocarbamol rxs 5 omeprazole rxs, 12 oxycodone rxs, 3 potassium chloride rxs and 2 pregablin rxs.  Advised the list was only updated to demonstrate the actual prescriptions he is taking and none of his medications were discontinued.  She reports she is now going to have to call his PCP Dr to have his medications re-prescribed.  Dr Anne Fu aware of medication list and conversation with wife.  No further actions needed at this time.

## 2022-12-09 ENCOUNTER — Other Ambulatory Visit (HOSPITAL_COMMUNITY): Payer: Self-pay

## 2022-12-10 ENCOUNTER — Other Ambulatory Visit (HOSPITAL_COMMUNITY): Payer: Self-pay

## 2022-12-11 ENCOUNTER — Other Ambulatory Visit (HOSPITAL_COMMUNITY): Payer: Self-pay

## 2022-12-11 ENCOUNTER — Other Ambulatory Visit: Payer: Self-pay

## 2022-12-11 MED ORDER — DAPAGLIFLOZIN PROPANEDIOL 10 MG PO TABS
10.0000 mg | ORAL_TABLET | Freq: Every day | ORAL | 0 refills | Status: DC
  Filled 2022-12-11: qty 90, 90d supply, fill #0

## 2022-12-11 MED ORDER — OMEPRAZOLE 40 MG PO CPDR
40.0000 mg | DELAYED_RELEASE_CAPSULE | Freq: Every day | ORAL | 0 refills | Status: DC
  Filled 2022-12-11 – 2023-05-07 (×2): qty 90, 90d supply, fill #0

## 2022-12-11 MED ORDER — LUBIPROSTONE 24 MCG PO CAPS
24.0000 ug | ORAL_CAPSULE | Freq: Two times a day (BID) | ORAL | 0 refills | Status: DC
  Filled 2022-12-11: qty 180, 90d supply, fill #0

## 2022-12-11 MED ORDER — POTASSIUM CHLORIDE ER 20 MEQ PO TBCR
20.0000 meq | EXTENDED_RELEASE_TABLET | Freq: Every day | ORAL | 0 refills | Status: DC
  Filled 2022-12-11: qty 90, 90d supply, fill #0

## 2022-12-12 ENCOUNTER — Other Ambulatory Visit (HOSPITAL_COMMUNITY): Payer: Self-pay

## 2022-12-12 ENCOUNTER — Other Ambulatory Visit (HOSPITAL_COMMUNITY): Payer: Self-pay | Admitting: Physician Assistant

## 2022-12-12 DIAGNOSIS — R09A2 Foreign body sensation, throat: Secondary | ICD-10-CM

## 2022-12-12 DIAGNOSIS — R070 Pain in throat: Secondary | ICD-10-CM

## 2022-12-12 MED ORDER — CLINDAMYCIN HCL 300 MG PO CAPS
300.0000 mg | ORAL_CAPSULE | Freq: Three times a day (TID) | ORAL | 0 refills | Status: DC
  Filled 2022-12-12: qty 30, 10d supply, fill #0

## 2022-12-15 ENCOUNTER — Ambulatory Visit (HOSPITAL_COMMUNITY)
Admission: RE | Admit: 2022-12-15 | Discharge: 2022-12-15 | Disposition: A | Discharge status: Home / Self Care | Payer: 59 | Source: Ambulatory Visit | Attending: Physician Assistant | Admitting: Physician Assistant

## 2022-12-15 DIAGNOSIS — R09A2 Foreign body sensation, throat: Secondary | ICD-10-CM | POA: Diagnosis present

## 2022-12-15 DIAGNOSIS — R070 Pain in throat: Secondary | ICD-10-CM | POA: Diagnosis present

## 2022-12-15 MED ORDER — IOHEXOL 350 MG/ML SOLN
75.0000 mL | Freq: Once | INTRAVENOUS | Status: AC | PRN
  Administered 2022-12-15: 75 mL via INTRAVENOUS

## 2022-12-16 ENCOUNTER — Other Ambulatory Visit (HOSPITAL_COMMUNITY): Payer: Self-pay

## 2022-12-16 MED ORDER — PREGABALIN 150 MG PO CAPS
150.0000 mg | ORAL_CAPSULE | Freq: Two times a day (BID) | ORAL | 0 refills | Status: DC
  Filled 2022-12-16: qty 60, 30d supply, fill #0

## 2022-12-16 MED ORDER — DICLOFENAC SODIUM 1 % EX GEL
CUTANEOUS | 2 refills | Status: AC
  Filled 2022-12-16: qty 100, 30d supply, fill #0
  Filled 2023-01-09: qty 100, 15d supply, fill #0
  Filled 2023-07-04: qty 100, 33d supply, fill #0
  Filled 2023-08-07: qty 100, 33d supply, fill #1
  Filled 2023-11-07: qty 100, 33d supply, fill #2
  Filled 2023-12-04: qty 100, 15d supply, fill #2

## 2022-12-17 ENCOUNTER — Other Ambulatory Visit (HOSPITAL_COMMUNITY): Payer: Self-pay

## 2022-12-17 MED ORDER — BELBUCA 900 MCG BU FILM
900.0000 ug | ORAL_FILM | Freq: Two times a day (BID) | BUCCAL | 0 refills | Status: DC
  Filled 2022-12-19 (×2): qty 60, 30d supply, fill #0

## 2022-12-17 MED ORDER — OXYCODONE HCL 20 MG PO TABS
20.0000 mg | ORAL_TABLET | Freq: Four times a day (QID) | ORAL | 0 refills | Status: DC | PRN
  Filled 2022-12-24: qty 120, 30d supply, fill #0

## 2022-12-18 ENCOUNTER — Other Ambulatory Visit (HOSPITAL_COMMUNITY): Payer: Self-pay

## 2022-12-18 ENCOUNTER — Other Ambulatory Visit: Payer: Self-pay

## 2022-12-19 ENCOUNTER — Other Ambulatory Visit (HOSPITAL_COMMUNITY): Payer: Self-pay

## 2022-12-22 ENCOUNTER — Other Ambulatory Visit (HOSPITAL_COMMUNITY): Payer: Self-pay

## 2022-12-22 MED ORDER — OMEPRAZOLE 40 MG PO CPDR
40.0000 mg | DELAYED_RELEASE_CAPSULE | Freq: Every day | ORAL | 5 refills | Status: AC
  Filled 2022-12-22 – 2023-08-08 (×4): qty 30, 30d supply, fill #0

## 2022-12-24 ENCOUNTER — Other Ambulatory Visit (HOSPITAL_COMMUNITY): Payer: Self-pay

## 2022-12-29 ENCOUNTER — Other Ambulatory Visit (HOSPITAL_COMMUNITY): Payer: Self-pay

## 2023-01-07 ENCOUNTER — Other Ambulatory Visit (HOSPITAL_COMMUNITY): Payer: Self-pay

## 2023-01-09 ENCOUNTER — Other Ambulatory Visit (HOSPITAL_COMMUNITY): Payer: Self-pay

## 2023-01-09 ENCOUNTER — Other Ambulatory Visit: Payer: Self-pay

## 2023-01-12 ENCOUNTER — Other Ambulatory Visit: Payer: Self-pay

## 2023-01-12 ENCOUNTER — Other Ambulatory Visit (HOSPITAL_COMMUNITY): Payer: Self-pay

## 2023-01-12 MED ORDER — DICLOFENAC SODIUM 1 % EX GEL
CUTANEOUS | 5 refills | Status: DC
  Filled 2023-01-12: qty 100, 10d supply, fill #0
  Filled 2023-09-07: qty 100, 30d supply, fill #0
  Filled 2023-10-04: qty 100, 30d supply, fill #1
  Filled 2023-11-10: qty 100, 30d supply, fill #2
  Filled 2023-12-28: qty 100, 30d supply, fill #3

## 2023-01-13 ENCOUNTER — Other Ambulatory Visit (HOSPITAL_COMMUNITY): Payer: Self-pay

## 2023-01-16 ENCOUNTER — Other Ambulatory Visit (HOSPITAL_COMMUNITY): Payer: Self-pay

## 2023-01-16 MED ORDER — OXYCODONE HCL 20 MG PO TABS
20.0000 mg | ORAL_TABLET | Freq: Four times a day (QID) | ORAL | 0 refills | Status: DC | PRN
  Filled 2023-01-24: qty 120, 30d supply, fill #0

## 2023-01-16 MED ORDER — PREGABALIN 150 MG PO CAPS
150.0000 mg | ORAL_CAPSULE | Freq: Two times a day (BID) | ORAL | 0 refills | Status: DC
  Filled 2023-01-16: qty 60, 30d supply, fill #0

## 2023-01-16 MED ORDER — BELBUCA 900 MCG BU FILM
1.0000 | ORAL_FILM | Freq: Two times a day (BID) | BUCCAL | 0 refills | Status: DC
  Filled 2023-01-19: qty 60, 30d supply, fill #0

## 2023-01-19 ENCOUNTER — Other Ambulatory Visit (HOSPITAL_COMMUNITY): Payer: Self-pay

## 2023-01-21 ENCOUNTER — Other Ambulatory Visit (HOSPITAL_COMMUNITY): Payer: Self-pay

## 2023-01-23 ENCOUNTER — Other Ambulatory Visit (HOSPITAL_COMMUNITY): Payer: Self-pay

## 2023-01-24 ENCOUNTER — Other Ambulatory Visit (HOSPITAL_COMMUNITY): Payer: Self-pay

## 2023-01-27 ENCOUNTER — Other Ambulatory Visit (HOSPITAL_COMMUNITY): Payer: Self-pay

## 2023-01-28 ENCOUNTER — Other Ambulatory Visit (HOSPITAL_COMMUNITY): Payer: Self-pay

## 2023-01-28 MED ORDER — KROGER BLOOD GLUCOSE TEST VI STRP
ORAL_STRIP | 12 refills | Status: AC
  Filled 2023-01-28: qty 100, 25d supply, fill #0

## 2023-01-30 ENCOUNTER — Other Ambulatory Visit: Payer: Self-pay

## 2023-02-03 ENCOUNTER — Other Ambulatory Visit: Payer: Self-pay

## 2023-02-05 ENCOUNTER — Other Ambulatory Visit (HOSPITAL_COMMUNITY): Payer: Self-pay

## 2023-02-09 ENCOUNTER — Other Ambulatory Visit (HOSPITAL_COMMUNITY): Payer: Self-pay

## 2023-02-10 ENCOUNTER — Other Ambulatory Visit (HOSPITAL_COMMUNITY): Payer: Self-pay

## 2023-02-10 ENCOUNTER — Encounter: Payer: Self-pay | Admitting: Internal Medicine

## 2023-02-10 MED ORDER — CLINDAMYCIN HCL 300 MG PO CAPS
ORAL_CAPSULE | ORAL | 0 refills | Status: DC
  Filled 2023-02-10: qty 30, 10d supply, fill #0

## 2023-02-11 ENCOUNTER — Other Ambulatory Visit (HOSPITAL_COMMUNITY): Payer: Self-pay

## 2023-02-11 MED ORDER — DAPAGLIFLOZIN PROPANEDIOL 10 MG PO TABS
10.0000 mg | ORAL_TABLET | Freq: Every day | ORAL | 0 refills | Status: DC
  Filled 2023-02-11 – 2023-03-27 (×2): qty 90, 90d supply, fill #0

## 2023-02-11 MED ORDER — LISINOPRIL 20 MG PO TABS
20.0000 mg | ORAL_TABLET | Freq: Every day | ORAL | 1 refills | Status: AC
  Filled 2023-02-11 – 2023-05-04 (×2): qty 90, 90d supply, fill #0
  Filled 2023-08-07 – 2023-08-08 (×2): qty 90, 90d supply, fill #1

## 2023-02-11 MED ORDER — METFORMIN HCL 1000 MG PO TABS
1000.0000 mg | ORAL_TABLET | Freq: Two times a day (BID) | ORAL | 1 refills | Status: AC
  Filled 2023-02-11 – 2023-08-08 (×3): qty 180, 90d supply, fill #0
  Filled 2023-11-07: qty 180, 90d supply, fill #1

## 2023-02-11 MED ORDER — GLIPIZIDE ER 10 MG PO TB24
20.0000 mg | ORAL_TABLET | Freq: Every day | ORAL | 1 refills | Status: DC
  Filled 2023-02-11 – 2023-04-08 (×2): qty 90, 45d supply, fill #0
  Filled 2023-07-08: qty 90, 45d supply, fill #1

## 2023-02-11 MED ORDER — LUBIPROSTONE 24 MCG PO CAPS
24.0000 ug | ORAL_CAPSULE | Freq: Two times a day (BID) | ORAL | 0 refills | Status: DC
  Filled 2023-02-11: qty 180, 90d supply, fill #0

## 2023-02-11 MED ORDER — POTASSIUM CHLORIDE ER 20 MEQ PO TBCR
1.0000 | EXTENDED_RELEASE_TABLET | Freq: Every day | ORAL | 0 refills | Status: DC
  Filled 2023-02-11 – 2023-02-17 (×3): qty 90, 90d supply, fill #0

## 2023-02-11 MED ORDER — KROGER BLOOD GLUCOSE TEST VI STRP
ORAL_STRIP | 12 refills | Status: AC
  Filled 2023-02-11: qty 150, 90d supply, fill #0
  Filled 2023-03-01: qty 100, 25d supply, fill #0
  Filled 2023-03-29: qty 100, 25d supply, fill #1
  Filled 2023-04-23: qty 100, 25d supply, fill #2
  Filled 2023-05-16: qty 100, 25d supply, fill #3
  Filled 2023-06-12 – 2023-10-11 (×4): qty 100, 25d supply, fill #4

## 2023-02-11 MED ORDER — DULOXETINE HCL 60 MG PO CPEP
60.0000 mg | ORAL_CAPSULE | Freq: Every day | ORAL | 1 refills | Status: AC
  Filled 2023-02-11 – 2023-03-12 (×2): qty 90, 90d supply, fill #0
  Filled 2023-12-06: qty 90, 90d supply, fill #1

## 2023-02-11 MED ORDER — ATORVASTATIN CALCIUM 10 MG PO TABS
10.0000 mg | ORAL_TABLET | Freq: Every day | ORAL | 1 refills | Status: AC
  Filled 2023-02-11 – 2023-05-07 (×2): qty 90, 90d supply, fill #0
  Filled 2023-08-07 – 2023-08-08 (×2): qty 90, 90d supply, fill #1

## 2023-02-16 ENCOUNTER — Other Ambulatory Visit (HOSPITAL_BASED_OUTPATIENT_CLINIC_OR_DEPARTMENT_OTHER): Payer: Self-pay

## 2023-02-16 ENCOUNTER — Other Ambulatory Visit (HOSPITAL_COMMUNITY): Payer: Self-pay

## 2023-02-16 MED ORDER — PREGABALIN 150 MG PO CAPS
150.0000 mg | ORAL_CAPSULE | Freq: Two times a day (BID) | ORAL | 0 refills | Status: DC
  Filled 2023-02-16: qty 60, 30d supply, fill #0

## 2023-02-17 ENCOUNTER — Other Ambulatory Visit: Payer: Self-pay

## 2023-02-17 ENCOUNTER — Other Ambulatory Visit (HOSPITAL_COMMUNITY): Payer: Self-pay

## 2023-02-17 MED ORDER — BELBUCA 900 MCG BU FILM
1.0000 | ORAL_FILM | Freq: Two times a day (BID) | BUCCAL | 0 refills | Status: DC
  Filled 2023-02-17: qty 60, 30d supply, fill #0

## 2023-02-17 MED ORDER — OXYCODONE HCL 20 MG PO TABS
20.0000 mg | ORAL_TABLET | Freq: Four times a day (QID) | ORAL | 0 refills | Status: DC | PRN
  Filled 2023-02-17 – 2023-02-21 (×2): qty 120, 30d supply, fill #0

## 2023-02-22 ENCOUNTER — Other Ambulatory Visit (HOSPITAL_COMMUNITY): Payer: Self-pay

## 2023-02-23 ENCOUNTER — Other Ambulatory Visit (HOSPITAL_COMMUNITY): Payer: Self-pay

## 2023-02-24 NOTE — Telephone Encounter (Signed)
Closing open encounter

## 2023-02-28 ENCOUNTER — Other Ambulatory Visit (HOSPITAL_COMMUNITY): Payer: Self-pay

## 2023-03-02 ENCOUNTER — Other Ambulatory Visit: Payer: Self-pay

## 2023-03-02 ENCOUNTER — Other Ambulatory Visit (HOSPITAL_COMMUNITY): Payer: Self-pay

## 2023-03-06 ENCOUNTER — Other Ambulatory Visit (HOSPITAL_COMMUNITY): Payer: Self-pay

## 2023-03-08 ENCOUNTER — Other Ambulatory Visit (HOSPITAL_COMMUNITY): Payer: Self-pay

## 2023-03-09 ENCOUNTER — Other Ambulatory Visit: Payer: Self-pay

## 2023-03-09 DIAGNOSIS — I723 Aneurysm of iliac artery: Secondary | ICD-10-CM

## 2023-03-12 ENCOUNTER — Other Ambulatory Visit (HOSPITAL_COMMUNITY): Payer: Self-pay

## 2023-03-13 ENCOUNTER — Other Ambulatory Visit (HOSPITAL_COMMUNITY): Payer: Self-pay

## 2023-03-13 ENCOUNTER — Other Ambulatory Visit: Payer: Self-pay

## 2023-03-17 ENCOUNTER — Other Ambulatory Visit (HOSPITAL_COMMUNITY): Payer: Self-pay

## 2023-03-17 MED ORDER — BELBUCA 900 MCG BU FILM
1.0000 | ORAL_FILM | BUCCAL | 0 refills | Status: DC
  Filled 2023-03-17 – 2023-03-19 (×3): qty 60, 30d supply, fill #0

## 2023-03-17 MED ORDER — OXYCODONE HCL 20 MG PO TABS
20.0000 mg | ORAL_TABLET | Freq: Four times a day (QID) | ORAL | 0 refills | Status: DC
  Filled 2023-03-25: qty 120, 30d supply, fill #0

## 2023-03-17 MED ORDER — PREGABALIN 150 MG PO CAPS
150.0000 mg | ORAL_CAPSULE | Freq: Two times a day (BID) | ORAL | 0 refills | Status: DC
  Filled 2023-03-19: qty 60, 30d supply, fill #0

## 2023-03-18 ENCOUNTER — Ambulatory Visit (AMBULATORY_SURGERY_CENTER): Payer: 59

## 2023-03-18 ENCOUNTER — Telehealth: Payer: Self-pay | Admitting: Cardiology

## 2023-03-18 ENCOUNTER — Telehealth: Payer: Self-pay | Admitting: *Deleted

## 2023-03-18 VITALS — Ht 69.0 in | Wt 244.0 lb

## 2023-03-18 DIAGNOSIS — Z1211 Encounter for screening for malignant neoplasm of colon: Secondary | ICD-10-CM

## 2023-03-18 MED ORDER — BISACODYL 5 MG PO TBEC: 5.0000 mg | DELAYED_RELEASE_TABLET | Freq: Every day | ORAL | 0 refills | Status: DC | PRN

## 2023-03-18 MED ORDER — POLYETHYLENE GLYCOL 3350 17 GM/SCOOP PO POWD: 0.5000 | Freq: Once | ORAL | 0 refills | Status: AC

## 2023-03-18 MED ORDER — SUFLAVE 178.7 G PO SOLR: 1.0000 | ORAL | 0 refills | Status: DC

## 2023-03-18 NOTE — Telephone Encounter (Signed)
I s/w the pt and his wife. Per preop APP Robin Searing, NP add pt on 03/20/23 and advise pt to not take anymore after today until after his procedure. Pt and wife are aware that Dr. Jenne Pane will advise when to resume ASA after procedure. [name] gave verbal understanding to plan of care. Med rec and consent are done.

## 2023-03-18 NOTE — Telephone Encounter (Signed)
   Pre-operative Risk Assessment    Patient Name: Thomas Mcclure  DOB: 08-21-1955 MRN: [id number]   Date of last office visit: 12/08/2022 Date of next office visit: N/A   Request for Surgical Clearance    Procedure:   Direct Laryngoscopy with Biopsy  Date of Surgery:  Clearance 03/23/23                                Surgeon:  Dr. Marijo Conception Group or Practice Name: [facility] Phone number:  [phone] Fax number:  [phone]   Type of Clearance Requested:   - Medical  - Pharmacy:  Hold Aspirin TBD by cardiologist   Type of Anesthesia:  General    Additional requests/questions:  Please fax a copy of medical clearance to the surgeon's office.  Mardelle Matte   03/18/2023, 1:45 PM

## 2023-03-18 NOTE — Telephone Encounter (Signed)
   Name: Thomas Mcclure  DOB: [birth year]  MRN: [id number]  Primary Cardiologist: Donato Schultz, MD   Preoperative team, please contact this patient and set up a phone call appointment for further preoperative risk assessment. Please obtain consent and complete medication review. Thank you for your help.  I confirm that guidance regarding antiplatelet and oral anticoagulation therapy has been completed and, if necessary, noted below.  Regarding ASA therapy, we recommend continuation of ASA throughout the perioperative period.  However, if the surgeon feels that cessation of ASA is required in the perioperative period, it may be stopped 5-7 days prior to surgery with a plan to resume it as soon as felt to be feasible from a surgical standpoint in the post-operative period.   I also confirmed the patient resides in the state of [place]. As per [facility] Medical Board telemedicine laws, the patient must reside in the state in which the provider is licensed.   Napoleon Form, Leodis Rains, NP 03/18/2023, 2:16 PM [facility]

## 2023-03-18 NOTE — Progress Notes (Signed)
No egg or soy allergy known to patient  No issues known to pt with past sedation with any surgeries or procedures Patient denies ever being told they had issues or difficulty with intubation  No FH of Malignant Hyperthermia Pt is not on diet pills Pt is not on  home 02  Pt is not on blood thinners  Pt denies issues with constipation  No A fib or A flutter Have any cardiac testing pending--No Pt can ambulate with intermittent cane use Pt denies use of chewing tobacco Discussed diabetic I weight loss medication holds Discussed NSAID holds Checked BMI Pt instructed to use Singlecare.com or GoodRx for a price reduction on prep  Patient's chart reviewed by Cathlyn Parsons CNRA prior to previsit and patient appropriate for the [facility].  Pre visit completed and red dot placed by patient's name on their procedure day (on provider's schedule).

## 2023-03-18 NOTE — Telephone Encounter (Signed)
I s/w the pt and his wife. Per preop APP Robin Searing, NP add pt on 03/20/23 and advise pt to not take anymore after today until after his procedure. Pt and wife are aware that Dr. Jenne Pane will advise when to resume ASA after procedure. [name] gave verbal understanding to plan of care. Med rec and consent are done.      Patient Consent for Virtual Visit        RANEY SCHUBBE has provided verbal consent on 03/18/2023 for a virtual visit (video or telephone).   CONSENT FOR VIRTUAL VISIT FOR:  Ezra Sites  By participating in this virtual visit I agree to the following:  I hereby voluntarily request, consent and authorize [facility] and its employed or contracted physicians, physician assistants, nurse practitioners or other licensed health care professionals (the Practitioner), to provide me with telemedicine health care services (the "Services") as deemed necessary by the treating Practitioner. I acknowledge and consent to receive the Services by the Practitioner via telemedicine. I understand that the telemedicine visit will involve communicating with the Practitioner through live audiovisual communication technology and the disclosure of certain medical information by electronic transmission. I acknowledge that I have been given the opportunity to request an in-person assessment or other available alternative prior to the telemedicine visit and am voluntarily participating in the telemedicine visit.  I understand that I have the right to withhold or withdraw my consent to the use of telemedicine in the course of my care at any time, without affecting my right to future care or treatment, and that the Practitioner or I may terminate the telemedicine visit at any time. I understand that I have the right to inspect all information obtained and/or recorded in the course of the telemedicine visit and may receive copies of available information for a reasonable fee.  I understand that some  of the potential risks of receiving the Services via telemedicine include:  Delay or interruption in medical evaluation due to technological equipment failure or disruption; Information transmitted may not be sufficient (e.g. poor resolution of images) to allow for appropriate medical decision making by the Practitioner; and/or  In rare instances, security protocols could fail, causing a breach of personal health information.  Furthermore, I acknowledge that it is my responsibility to provide information about my medical history, conditions and care that is complete and accurate to the best of my ability. I acknowledge that Practitioner's advice, recommendations, and/or decision may be based on factors not within their control, such as incomplete or inaccurate data provided by me or distortions of diagnostic images or specimens that may result from electronic transmissions. I understand that the practice of medicine is not an exact science and that Practitioner makes no warranties or guarantees regarding treatment outcomes. I acknowledge that a copy of this consent can be made available to me via my patient portal [facility] MyChart), or I can request a printed copy by calling the office of [facility].    I understand that my insurance will be billed for this visit.   I have read or had this consent read to me. I understand the contents of this consent, which adequately explains the benefits and risks of the Services being provided via telemedicine.  I have been provided ample opportunity to ask questions regarding this consent and the Services and have had my questions answered to my satisfaction. I give my informed consent for the services to be provided through the use of  telemedicine in my medical care

## 2023-03-19 ENCOUNTER — Other Ambulatory Visit: Payer: Self-pay | Admitting: Otolaryngology

## 2023-03-19 ENCOUNTER — Other Ambulatory Visit (HOSPITAL_COMMUNITY): Payer: Self-pay

## 2023-03-20 ENCOUNTER — Encounter (HOSPITAL_BASED_OUTPATIENT_CLINIC_OR_DEPARTMENT_OTHER)
Admission: RE | Admit: 2023-03-20 | Discharge: 2023-03-20 | Disposition: A | Discharge status: Home / Self Care | Payer: 59 | Source: Ambulatory Visit | Attending: Otolaryngology | Admitting: Otolaryngology

## 2023-03-20 ENCOUNTER — Encounter (HOSPITAL_BASED_OUTPATIENT_CLINIC_OR_DEPARTMENT_OTHER): Payer: Self-pay | Admitting: Otolaryngology

## 2023-03-20 ENCOUNTER — Telehealth: Payer: Self-pay | Admitting: Nurse Practitioner

## 2023-03-20 ENCOUNTER — Ambulatory Visit: Payer: 59 | Attending: Nurse Practitioner

## 2023-03-20 ENCOUNTER — Other Ambulatory Visit: Payer: Self-pay

## 2023-03-20 DIAGNOSIS — Z01810 Encounter for preprocedural cardiovascular examination: Secondary | ICD-10-CM

## 2023-03-20 DIAGNOSIS — E118 Type 2 diabetes mellitus with unspecified complications: Secondary | ICD-10-CM | POA: Diagnosis not present

## 2023-03-20 DIAGNOSIS — Z01812 Encounter for preprocedural laboratory examination: Secondary | ICD-10-CM | POA: Insufficient documentation

## 2023-03-20 LAB — BASIC METABOLIC PANEL
Anion gap: 10 (ref 5–15)
BUN: 13 mg/dL (ref 8–23)
CO2: 28 mmol/L (ref 22–32)
Calcium: 9.1 mg/dL (ref 8.9–10.3)
Chloride: 99 mmol/L (ref 98–111)
Creatinine, Ser: 0.9 mg/dL (ref 0.61–1.24)
GFR, Estimated: >60 mL/min (ref >60)
Glucose, Bld: 107 mg/dL — ABNORMAL HIGH (ref 70–99)
Potassium: 3.8 mmol/L (ref 3.5–5.1)
Sodium: 137 mmol/L (ref 135–145)

## 2023-03-20 NOTE — Progress Notes (Signed)
Virtual Visit via Telephone Note   Because of Thomas Mcclure's co-morbid illnesses, he is at least at moderate risk for complications without adequate follow up.  This format is felt to be most appropriate for this patient at this time.  The patient did not have access to video technology/had technical difficulties with video requiring transitioning to audio format only (telephone).  All issues noted in this document were discussed and addressed.  No physical exam could be performed with this format.  Please refer to the patient's chart for his consent to telehealth for [facility].  Evaluation Performed:  Preoperative cardiovascular risk assessment _____________   Date:  03/20/2023   Patient ID:  Thomas Mcclure, Thomas Mcclure [birth year], MRN [id number] Patient Location:  Home Provider location:   Office  Primary Care Provider:  Bettey Costa, MD Primary Cardiologist:  Donato Schultz, MD  Chief Complaint / Patient Profile   68 y.o. y/o male with a h/o CAD s/p NSTEMI 2017 DES to RCA, HFrEF, HTN, HLD, DM type II, left iliac artery aneurysm who is pending direct laryngoscopy with biopsy and presents today for telephonic preoperative cardiovascular risk assessment.  History of Present Illness    Thomas Mcclure is a 68 y.o. male who presents via audio/video conferencing for a telehealth visit today.  Pt was last seen in cardiology clinic on 12/08/2022 by Dr. Anne Fu. At that time STOCKTON NUNLEY was doing well .  The patient is now pending procedure as outlined above. Since his last visit, he    denies chest pain, shortness of breath, lower extremity edema, fatigue, palpitations, melena, hematuria, hemoptysis, diaphoresis, weakness, presyncope, syncope, orthopnea, and PND.   Patient may hold ASA 81 mg 5 to 7 days prior to procedure and restart postprocedure when surgically safe.  -Patient was a no show for virtual visit today.  Past Medical History    Past Medical History:  Diagnosis  Date   Baker's cyst    Left calf   CAD in native artery, 07/15/15 PCI of RCA with DES 07/16/2015   a.   NSTEMI 5/17: LHC - pLAD 20, pLCx 20, OM1 30, pRCA 100, EF 45-50%>> PCI: 2.5 x 24 mm Promus DES to RCA  //  b.   Echo 5/17: mild LVH, EF 50-55%, no RWMA, mod RVE //  c. 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 Quitman Livings [phone]   Iliac artery aneurysm (HCC)    1.5 cm left IIA 09/15/21 CTA; follow-up in ~ 18 months per vascular surgeon   Ischemic cardiomyopathy    a. LV-gram at time of LHC in 6/17 with EF 35-45%  //  b. Echo 7/17: EF 45-50%, inferior HK, grade 1 diastolic dysfunction, mildly dilated aortic root, moderately reduced RVSF, mild RAE   Myocardial infarction (HCC) 2017   Neuromuscular disorder (HCC)    "with nerve damage"   Numbness and tingling of both lower extremities    "on the outside of both sides" (02/18/2017)   Rheumatoid arthritis (HCC)  RA   Tachycardia    Type II diabetes mellitus (HCC)    diet controlled   Past Surgical History:  Procedure Laterality Date   BACK SURGERY     x3   bilateral cataract curgery      CARDIAC CATHETERIZATION N/A 07/15/2015   Procedure: Left Heart Cath and Coronary Angiography;  Surgeon: Peter M Swaziland, MD;  Location: [facility] INVASIVE CV LAB;  Service: Cardiovascular;  Laterality: N/A;   CARDIAC CATHETERIZATION N/A 07/15/2015   Procedure: Coronary Stent Intervention;  Surgeon: Peter M Swaziland, MD;  Location: [facility] INVASIVE CV LAB;  Service: Cardiovascular;  Laterality: N/A;   CARDIAC CATHETERIZATION N/A 08/07/2015   Procedure: Left Heart Cath and Coronary  Angiography;  Surgeon: Lyn Records, MD;  Location: [facility] INVASIVE CV LAB;  Service: Cardiovascular;  Laterality: N/A;   CORONARY ANGIOPLASTY WITH STENT PLACEMENT  07/2015   LEFT HEART CATH AND CORONARY ANGIOGRAPHY N/A 02/19/2017   Procedure: LEFT HEART CATH AND CORONARY ANGIOGRAPHY;  Surgeon: Swaziland, Peter M, MD;  Location: [facility] INVASIVE CV LAB;  Service: Cardiovascular;  Laterality: N/A;   LUMBAR FUSION  04/11/2019   REVISION OF THORACOLUMBAR FUSION    LUMBAR SPINE SURGERY  03/2009  & 2012   MASS EXCISION N/A 04/19/2012   Procedure: removal of posterior cervical lipoma;  Surgeon: Cristi Loron, MD;  Location: [facility] NEURO ORS;  Service: Neurosurgery;  Laterality: N/A;  Removal of posterior cervical lipoma   POPLITEAL SYNOVIAL CYST EXCISION Left    "opened up behind my knee; scraped out arthritis"   POSTERIOR LUMBAR FUSION 4 LEVEL N/A 11/22/2018   Procedure: Posterior Lateral and Interbody fusion - Lumbar one-Lumbar two; explore fusion, posterior instrumentation and fusion Thoracic ten to the ilium;  Surgeon: Tressie Stalker, MD;  Location: [facility] OR;  Service: Neurosurgery;  Laterality: N/A;   SPINAL CORD STIMULATOR INSERTION N/A 01/07/2013   Procedure:  SPINAL CORD STIMULATOR INSERTION;  Surgeon: Gwynne Edinger, MD;  Location: [facility] NEURO ORS;  Service: Neurosurgery;  Laterality: N/A;   SPINAL CORD STIMULATOR INSERTION N/A 02/09/2019   Procedure: REPLACEMENT OF LUMBAR SPINAL CORD STIMULATOR;  Surgeon: Tressie Stalker, MD;  Location: [facility] OR;  Service: Neurosurgery;  Laterality: N/A;  Thoracic/Lumbar spine   TONSILLECTOMY     patient denies   TOTAL HIP ARTHROPLASTY Right 10/03/2016   Procedure: TOTAL HIP ARTHROPLASTY;  Surgeon: Frederico Hamman, MD;  Location: [facility] OR;  Service: Orthopedics;  Laterality: Right;   TOTAL KNEE ARTHROPLASTY Right 11/09/2020   Procedure: TOTAL KNEE ARTHROPLASTY;  Surgeon: Frederico Hamman, MD;  Location: [facility] ORS;  Service: Orthopedics;  Laterality: Right;   TOTAL KNEE REVISION  Right 04/02/2022   Procedure: TOTAL KNEE REVISION;  Surgeon: Joen Laura, MD;  Location: [facility] OR;  Service: Orthopedics;  Laterality: Right;   WISDOM TOOTH EXTRACTION     hx of    Allergies  Allergies  Allergen Reactions   Aquacel Extra Hydrofiber [Wound Dressings] Other (See Comments)    Blisters to the skin   Brilinta [Ticagrelor] Shortness Of Breath and Other (See Comments)    CHEST PAIN    Methylprednisolone Other (See Comments)    DIAPHORESIS, HYPOTENSION   Prednisone Other (See Comments)    DIAPHORESIS, HYPOTENSION   Toradol [Ketorolac Tromethamine] Other (See Comments)    DIAPHORESIS, HYPOTENSION     Adhesive [Tape] Rash and Other (See Comments)    "blisters" ( NO SURGICAL TAPE, PLEASE)    Home Medications    Prior to Admission medications   Medication Sig Start  Date End Date Taking? Authorizing Provider  ACCU-CHEK AVIVA PLUS test strip  04/30/19   [provider]  Accu-Chek Softclix Lancets lancets 4 (four) times daily. 07/12/20   [provider]  acetaminophen (TYLENOL) 500 MG tablet Take 1,000 mg by mouth 2 (two) times daily as needed (pain.).    [provider]  atorvastatin (LIPITOR) 10 MG tablet Take 1 tablet (10 mg total) by mouth at bedtime. Patient not taking: Reported on 03/18/2023 06/03/22     atorvastatin (LIPITOR) 10 MG tablet Take 1 tablet (10 mg total) by mouth at bedtime. 02/11/23     bisacodyl 5 MG EC tablet Take 1 tablet (5 mg total) by mouth daily as needed for moderate constipation. 03/18/23   Pyrtle, Carie Caddy, MD  Buprenorphine HCl (BELBUCA) 900 MCG FILM Place 1 (one) Film inside cheeks two times daily Patient not taking: Reported on 03/18/2023 05/20/22     Buprenorphine HCl (BELBUCA) 900 MCG FILM Dissolve 1 (one) Film inside cheeks two times daily 03/17/23     carvedilol (COREG) 12.5 MG tablet Take 1 tablet (12.5 mg total) by mouth 2 (two) times daily with a meal. 12/08/22   Jake Bathe, MD  chlorhexidine (PERIDEX) 0.12 %  solution Swish and spit out by mouth 2 times a day 11/12/22     chlorthalidone (HYGROTON) 25 MG tablet Take 1 tablet (25 mg total) by mouth daily. **must keep dr appointment in Oct 2024* 12/08/22   Jake Bathe, MD  clindamycin (CLEOCIN) 300 MG capsule Take 1 capsule (300 mg total) by mouth 3 (three) times a day for 10 days. 02/10/23     Continuous Blood Gluc Sensor (FREESTYLE LIBRE 2 SENSOR) MISC Use to test blood sugar, change every other week 10/03/21     dapagliflozin propanediol (FARXIGA) 10 MG TABS tablet 1 tab by mouth daily 02/11/23     diclofenac Sodium (VOLTAREN ARTHRITIS PAIN) 1 % GEL Apply small amount to affected area twice a day as needed Patient not taking: Reported on 03/18/2023 07/03/22     diclofenac Sodium (VOLTAREN) 1 % GEL Apply small amount to the affected area 3 times a day as needed for pain Patient not taking: Reported on 03/18/2023 12/16/22     diclofenac Sodium (VOLTAREN) 1 % GEL Apply a small amount to affected area 2 times a day as needed for pain 01/12/23     diltiazem (DILT-XR) 180 MG 24 hr capsule Take 1 capsule (180 mg total) by mouth daily. 12/08/22   Jake Bathe, MD  docusate sodium (COLACE) 100 MG capsule Take 1 capsule (100 mg total) by mouth 2 (two) times daily. Patient taking differently: Take 300 mg by mouth 2 (two) times daily. 02/10/19   Val Eagle D, NP  DULoxetine (CYMBALTA) 60 MG capsule Take 1 capsule (60 mg total) by mouth daily. 02/11/23     furosemide (LASIX) 40 MG tablet Take 1 tablet (40 mg total) by mouth daily. Patient not taking: Reported on 03/18/2023 07/07/22     glipiZIDE (GLUCOTROL XL) 10 MG 24 hr tablet Take 2 tablets (20 mg total) by mouth daily with breakfast. 11/12/22     glipiZIDE (GLUCOTROL XL) 10 MG 24 hr tablet Take 2 tablets (20 mg total) by mouth daily with breakfast. Patient not taking: Reported on 03/18/2023 02/11/23     glucose blood ([employer] BLOOD GLUCOSE TEST) test strip Use to test blood sugar 4 times a day 01/28/23      glucose blood ([employer] BLOOD GLUCOSE TEST)  test strip Use 1 Strip four times daily, accu-chek aviva plus 02/11/23     glucose blood test strip Use to test 4 times a day as directed 12/09/21     linaclotide (LINZESS) 145 MCG CAPS capsule Take 1 capsule (145 mcg total) by mouth daily. 11/12/22     linaclotide (LINZESS) 145 MCG CAPS capsule Take 145 mcg by mouth daily before breakfast.    [provider]  lisinopril (ZESTRIL) 20 MG tablet Take 1 tablet (20 mg total) by mouth daily. 02/11/23     lubiprostone (AMITIZA) 24 MCG capsule Take 2 capsules (48 mcg total) by mouth at bedtime. Patient not taking: Reported on 03/18/2023 08/19/21     lubiprostone (AMITIZA) 24 MCG capsule Take 1 capsule (24 mcg total) by mouth 2 (two) times daily. 02/11/23     metFORMIN (GLUCOPHAGE) 1000 MG tablet Take 1 tablet (1,000 mg total) by mouth 2 (two) times daily. Patient not taking: Reported on 03/18/2023 11/12/22     metFORMIN (GLUCOPHAGE) 1000 MG tablet Take 1 tablet (1,000 mg total) by mouth 2 (two) times daily. 02/11/23     methocarbamol (ROBAXIN) 500 MG tablet Take 1 tablet (500 mg total) by mouth every 6 (six) hours as needed for pain. Patient not taking: Reported on 03/18/2023 05/13/22     methocarbamol (ROBAXIN) 750 MG tablet Take 1 tablet (750 mg total) by mouth 2 (two) times daily as needed. 11/12/22     Multiple Vitamin (MULTIVITAMIN WITH MINERALS) TABS tablet Take 1 tablet by mouth daily.    [provider]  naloxone [facility] [place]) nasal spray 4 mg/0.1 mL Place 1 spray into the nose as needed. If found unresposive then spray this into nose and call 911 immediately 11/16/21     omeprazole (PRILOSEC) 40 MG capsule Take 1 capsule (40 mg total) by mouth daily. Patient not taking: Reported on 03/18/2023 11/12/22     omeprazole (PRILOSEC) 40 MG capsule Take 1 capsule (40 mg total) by mouth daily. Patient not taking: Reported on 03/18/2023 12/10/22     omeprazole (PRILOSEC) 40 MG capsule Take 1 capsule (40 mg  total) by mouth daily. 12/22/22     oxyCODONE (OXY IR/ROXICODONE) 5 MG immediate release tablet Take 1 tablet (5 mg total) by mouth every 4 (four) hours as needed for pain. Patient not taking: Reported on 03/18/2023 06/24/22     Oxycodone HCl 20 MG TABS Take 1 tablet (20 mg total) by mouth every 4 (four) hours as needed for pain. Patient not taking: Reported on 03/18/2023 04/16/22     Oxycodone HCl 20 MG TABS Take 1 tablet (20 mg total) by mouth every 6 (six) hours as needed. Patient not taking: Reported on 03/18/2023 12/16/22     Oxycodone HCl 20 MG TABS Take 1 tablet (20 mg total) by mouth every 6 (six) hours as needed. Patient not taking: Reported on 03/18/2023 02/16/23     Oxycodone HCl 20 MG TABS Take 1 tablet (20 mg total) by mouth every 6 (six) hours. Patient not taking: Reported on 03/18/2023 03/17/23     PEG 3350-KCl-NaCl-NaSulf-MgSul (SUFLAVE) 178.7 g SOLR Take 1 kit by mouth as directed. 03/18/23   Pyrtle, Carie Caddy, MD  Potassium Chloride ER 20 MEQ TBCR Take 1 tablet (20 mEq total) by mouth 2 (two) times daily. Patient not taking: Reported on 03/18/2023 04/16/22     Potassium Chloride ER 20 MEQ TBCR Take 1 tablet (20 mEq total) by mouth daily. Patient not taking: Reported on 03/18/2023 12/10/22  Potassium Chloride ER 20 MEQ TBCR Take 1 tablet (20 mEq total) by mouth daily. 02/11/23     pregabalin (LYRICA) 150 MG capsule Take 1 capsule by mouth 2 times daily Patient not taking: Reported on 03/18/2023 07/21/22     pregabalin (LYRICA) 150 MG capsule Take 1 capsule (150 mg total) by mouth 2 (two) times daily. Patient not taking: Reported on 03/18/2023 01/15/23     pregabalin (LYRICA) 150 MG capsule Take 1 capsule (150 mg total) by mouth 2 (two) times daily. 03/17/23     sildenafil (VIAGRA) 100 MG tablet Take 1 tablet (100 mg total) by mouth daily as needed. 05/07/21       Physical Exam    Vital Signs:  KYLAR LEONHARDT does not have vital signs available for review today.  Given telephonic nature  of communication, physical exam is limited. AAOx3. NAD. Normal affect.  Speech and respirations are unlabored.  Accessory Clinical Findings    None  Assessment & Plan    1.  Preoperative Cardiovascular Risk Assessment: -Patient's RCRI score is 11%  The patient was advised that if he develops new symptoms prior to surgery to contact our office to arrange for a follow-up visit, and he verbalized understanding.  (Reminder: Include SBE prophylaxis/Antiplatelet/Anticoag Instructions)  A copy of this note will be routed to requesting surgeon.  Time:   Today, I have spent  minutes with the patient with telehealth technology discussing medical history, symptoms, and management plan.     Napoleon Form, Leodis Rains, NP  03/20/2023, 7:36 AM

## 2023-03-20 NOTE — Progress Notes (Signed)
   03/20/23 1006  PAT Phone Screen  Is the patient taking a GLP-1 receptor agonist? No  Do You Have Diabetes? Yes  Do You Have Hypertension? Yes  Have You Ever Been to the ER for Asthma? No  Have You Taken Oral Steroids in the Past 3 Months? No  Do you Take Phenteramine or any Other Diet Drugs? No  Recent  Lab Work, EKG, CXR? Yes  Where was this test performed? 12-08-22 EKG  Do you have a history of heart problems? (S)  Yes (CAD, MI, stent to RCA 2017, CHF)  Cardiologist Name Dr Anne Fu  Have you ever had tests on your heart? Yes  What cardiac tests were performed? Cardiac Cath;Echo;Stress Test  What date/year were cardiac tests completed? 10-15-18 ECHO EF 45-50%, 10-12-18 Myoview stress test, no new ischemia  Results viewable: [facility] Media Tab  Any Recent Hospitalizations? No  Height 5\' 9"  (1.753 m)  Weight 110.7 kg  Pat Appointment Scheduled (S)  Yes (BMP)

## 2023-03-20 NOTE — Telephone Encounter (Signed)
Call placed today to Thomas Mcclure for scheduled preop clearance visit at 11:00 AM.  Patient was unavailable and a detailed message was left on his voicemail to return our call at his earliest convenience.  Robin Searing, NP

## 2023-03-23 ENCOUNTER — Ambulatory Visit (HOSPITAL_BASED_OUTPATIENT_CLINIC_OR_DEPARTMENT_OTHER): Payer: 59 | Admitting: Anesthesiology

## 2023-03-23 ENCOUNTER — Encounter (HOSPITAL_BASED_OUTPATIENT_CLINIC_OR_DEPARTMENT_OTHER)
Admission: RE | Disposition: A | Discharge status: Home / Self Care | Payer: Self-pay | Source: Home / Self Care | Attending: Otolaryngology

## 2023-03-23 ENCOUNTER — Other Ambulatory Visit: Payer: Self-pay

## 2023-03-23 ENCOUNTER — Encounter (HOSPITAL_BASED_OUTPATIENT_CLINIC_OR_DEPARTMENT_OTHER): Payer: Self-pay | Admitting: Otolaryngology

## 2023-03-23 ENCOUNTER — Ambulatory Visit (HOSPITAL_BASED_OUTPATIENT_CLINIC_OR_DEPARTMENT_OTHER)
Admission: RE | Admit: 2023-03-23 | Discharge: 2023-03-23 | Disposition: A | Discharge status: Home / Self Care | Payer: 59 | Attending: Otolaryngology | Admitting: Otolaryngology

## 2023-03-23 DIAGNOSIS — Z79899 Other long term (current) drug therapy: Secondary | ICD-10-CM | POA: Diagnosis not present

## 2023-03-23 DIAGNOSIS — I252 Old myocardial infarction: Secondary | ICD-10-CM | POA: Insufficient documentation

## 2023-03-23 DIAGNOSIS — J387 Other diseases of larynx: Secondary | ICD-10-CM | POA: Insufficient documentation

## 2023-03-23 DIAGNOSIS — I25119 Atherosclerotic heart disease of native coronary artery with unspecified angina pectoris: Secondary | ICD-10-CM | POA: Insufficient documentation

## 2023-03-23 DIAGNOSIS — R070 Pain in throat: Secondary | ICD-10-CM | POA: Insufficient documentation

## 2023-03-23 DIAGNOSIS — M549 Dorsalgia, unspecified: Secondary | ICD-10-CM | POA: Insufficient documentation

## 2023-03-23 DIAGNOSIS — K219 Gastro-esophageal reflux disease without esophagitis: Secondary | ICD-10-CM | POA: Insufficient documentation

## 2023-03-23 DIAGNOSIS — Z87891 Personal history of nicotine dependence: Secondary | ICD-10-CM | POA: Insufficient documentation

## 2023-03-23 DIAGNOSIS — M1990 Unspecified osteoarthritis, unspecified site: Secondary | ICD-10-CM | POA: Insufficient documentation

## 2023-03-23 DIAGNOSIS — E118 Type 2 diabetes mellitus with unspecified complications: Secondary | ICD-10-CM

## 2023-03-23 DIAGNOSIS — E1151 Type 2 diabetes mellitus with diabetic peripheral angiopathy without gangrene: Secondary | ICD-10-CM | POA: Insufficient documentation

## 2023-03-23 DIAGNOSIS — Z955 Presence of coronary angioplasty implant and graft: Secondary | ICD-10-CM | POA: Insufficient documentation

## 2023-03-23 DIAGNOSIS — G8929 Other chronic pain: Secondary | ICD-10-CM | POA: Insufficient documentation

## 2023-03-23 DIAGNOSIS — I5042 Chronic combined systolic (congestive) and diastolic (congestive) heart failure: Secondary | ICD-10-CM | POA: Insufficient documentation

## 2023-03-23 DIAGNOSIS — I110 Hypertensive heart disease with heart failure: Secondary | ICD-10-CM | POA: Diagnosis not present

## 2023-03-23 DIAGNOSIS — Z7984 Long term (current) use of oral hypoglycemic drugs: Secondary | ICD-10-CM | POA: Diagnosis not present

## 2023-03-23 HISTORY — PX: DIRECT LARYNGOSCOPY: SHX5326

## 2023-03-23 LAB — GLUCOSE, CAPILLARY
Glucose-Capillary: 132 mg/dL — ABNORMAL HIGH (ref 70–99)
Glucose-Capillary: 109 mg/dL — ABNORMAL HIGH (ref 70–99)

## 2023-03-23 SURGERY — LARYNGOSCOPY, DIRECT
Anesthesia: General | Site: Throat

## 2023-03-23 MED ORDER — OXYCODONE HCL 5 MG/5ML PO SOLN: 5.0000 mg | Freq: Once | ORAL | Status: DC | PRN

## 2023-03-23 MED ORDER — FENTANYL CITRATE (PF) 100 MCG/2ML IJ SOLN
INTRAMUSCULAR | Status: AC
  Filled 2023-03-23: qty 2

## 2023-03-23 MED ORDER — MIDAZOLAM HCL 5 MG/5ML IJ SOLN
INTRAMUSCULAR | Status: DC | PRN
  Administered 2023-03-23: 2 mg via INTRAVENOUS

## 2023-03-23 MED ORDER — EPINEPHRINE PF 1 MG/ML IJ SOLN
INTRAMUSCULAR | Status: AC
  Filled 2023-03-23: qty 2

## 2023-03-23 MED ORDER — SUCCINYLCHOLINE CHLORIDE 200 MG/10ML IV SOSY
PREFILLED_SYRINGE | INTRAVENOUS | Status: DC | PRN
  Administered 2023-03-23: 140 mg via INTRAVENOUS

## 2023-03-23 MED ORDER — PHENYLEPHRINE 80 MCG/ML (10ML) SYRINGE FOR IV PUSH (FOR BLOOD PRESSURE SUPPORT)
PREFILLED_SYRINGE | INTRAVENOUS | Status: AC
  Filled 2023-03-23: qty 10

## 2023-03-23 MED ORDER — PHENYLEPHRINE HCL-NACL 20-0.9 MG/250ML-% IV SOLN
INTRAVENOUS | Status: DC | PRN
  Administered 2023-03-23: 160 ug via INTRAVENOUS

## 2023-03-23 MED ORDER — FENTANYL CITRATE (PF) 250 MCG/5ML IJ SOLN
INTRAMUSCULAR | Status: DC | PRN
  Administered 2023-03-23 (×2): 50 ug via INTRAVENOUS

## 2023-03-23 MED ORDER — ACETAMINOPHEN 325 MG PO TABS
325.0000 mg | ORAL_TABLET | ORAL | Status: DC | PRN
Start: 2023-03-23 — End: 2023-03-23

## 2023-03-23 MED ORDER — MIDAZOLAM HCL 2 MG/2ML IJ SOLN
INTRAMUSCULAR | Status: AC
Start: 2023-03-23 — End: ?
  Filled 2023-03-23: qty 2

## 2023-03-23 MED ORDER — PROPOFOL 10 MG/ML IV BOLUS
INTRAVENOUS | Status: DC | PRN
  Administered 2023-03-23: 150 mg via INTRAVENOUS

## 2023-03-23 MED ORDER — ONDANSETRON HCL 4 MG/2ML IJ SOLN
INTRAMUSCULAR | Status: DC | PRN
  Administered 2023-03-23: 4 mg via INTRAVENOUS

## 2023-03-23 MED ORDER — ONDANSETRON HCL 4 MG/2ML IJ SOLN
INTRAMUSCULAR | Status: AC
  Filled 2023-03-23: qty 2

## 2023-03-23 MED ORDER — LIDOCAINE 2% (20 MG/ML) 5 ML SYRINGE
INTRAMUSCULAR | Status: AC
  Filled 2023-03-23: qty 5

## 2023-03-23 MED ORDER — SODIUM CHLORIDE 0.9 % IV SOLN: INTRAVENOUS | Status: DC | PRN

## 2023-03-23 MED ORDER — SUCCINYLCHOLINE CHLORIDE 200 MG/10ML IV SOSY
PREFILLED_SYRINGE | INTRAVENOUS | Status: AC
  Filled 2023-03-23: qty 10

## 2023-03-23 MED ORDER — EPINEPHRINE PF 1 MG/ML IJ SOLN
INTRAMUSCULAR | Status: DC | PRN
  Administered 2023-03-23: 1 mg

## 2023-03-23 MED ORDER — MEPERIDINE HCL 25 MG/ML IJ SOLN: 6.2500 mg | INTRAMUSCULAR | Status: DC | PRN

## 2023-03-23 MED ORDER — OXYCODONE HCL 5 MG PO TABS
5.0000 mg | ORAL_TABLET | Freq: Once | ORAL | Status: DC | PRN
Start: 2023-03-23 — End: 2023-03-23

## 2023-03-23 MED ORDER — LIDOCAINE 2% (20 MG/ML) 5 ML SYRINGE
INTRAMUSCULAR | Status: DC | PRN
  Administered 2023-03-23: 100 mg via INTRAVENOUS

## 2023-03-23 MED ORDER — LACTATED RINGERS IV SOLN: INTRAVENOUS | Status: DC

## 2023-03-23 MED ORDER — ACETAMINOPHEN 160 MG/5ML PO SOLN
325.0000 mg | ORAL | Status: DC | PRN
Start: 2023-03-23 — End: 2023-03-23

## 2023-03-23 MED ORDER — DEXMEDETOMIDINE HCL IN NACL 80 MCG/20ML IV SOLN
INTRAVENOUS | Status: AC
  Filled 2023-03-23: qty 20

## 2023-03-23 MED ORDER — FENTANYL CITRATE (PF) 100 MCG/2ML IJ SOLN
25.0000 ug | INTRAMUSCULAR | Status: DC | PRN
  Administered 2023-03-23: 50 ug via INTRAVENOUS

## 2023-03-23 MED ORDER — ONDANSETRON HCL 4 MG/2ML IJ SOLN
4.0000 mg | Freq: Once | INTRAMUSCULAR | Status: DC | PRN
Start: 2023-03-23 — End: 2023-03-23

## 2023-03-23 SURGICAL SUPPLY — 21 items
ANTIFOG SOL W/FOAM PAD STRL (MISCELLANEOUS) ×1
CANISTER SUCT 1200ML W/VALVE (MISCELLANEOUS) ×1 IMPLANT
CNTNR URN SCR LID CUP LEK RST (MISCELLANEOUS) IMPLANT
COVER BACK TABLE 60X90IN (DRAPES) ×1 IMPLANT
COVER MAYO STAND STRL (DRAPES) IMPLANT
GAUZE 4X4 16PLY ~~LOC~~+RFID DBL (SPONGE) ×1 IMPLANT
GAUZE SPONGE 4X4 12PLY STRL (GAUZE/BANDAGES/DRESSINGS) IMPLANT
GLOVE BIO SURGEON STRL SZ7.5 (GLOVE) ×1 IMPLANT
GUARD TEETH (MISCELLANEOUS) ×1 IMPLANT
MARKER SKIN DUAL TIP RULER LAB (MISCELLANEOUS) IMPLANT
NDL HYPO 18GX1.5 BLUNT FILL (NEEDLE) IMPLANT
NEEDLE HYPO 18GX1.5 BLUNT FILL (NEEDLE)
NS IRRIG 1000ML POUR BTL (IV SOLUTION) ×1 IMPLANT
PACK BASIN DAY SURGERY FS (CUSTOM PROCEDURE TRAY) ×1 IMPLANT
PATTIES SURGICAL .5 X3 (DISPOSABLE) IMPLANT
SHEET MEDIUM DRAPE 40X70 STRL (DRAPES) ×1 IMPLANT
SOLUTION ANTFG W/FOAM PAD STRL (MISCELLANEOUS) IMPLANT
SURGILUBE 2OZ TUBE FLIPTOP (MISCELLANEOUS) IMPLANT
SYR 5ML LL (SYRINGE) IMPLANT
TOWEL GREEN STERILE FF (TOWEL DISPOSABLE) ×2 IMPLANT
TUBE CONNECTING 20X1/4 (TUBING) ×1 IMPLANT

## 2023-03-23 NOTE — Anesthesia Procedure Notes (Signed)
Procedure Name: Intubation Date/Time: 03/23/2023 10:56 AM  Performed by: Yolanda Bonine, CRNAPre-anesthesia Checklist: Patient identified, Emergency Drugs available, Suction available, Patient being monitored and Timeout performed Patient Re-evaluated:Patient Re-evaluated prior to induction Oxygen Delivery Method: Circle system utilized Preoxygenation: Pre-oxygenation with 100% oxygen Induction Type: IV induction Ventilation: Mask ventilation without difficulty Laryngoscope Size: Mac and 4 Grade View: Grade I Tube size: 6.5 mm Number of attempts: 1 Airway Equipment and Method: Stylet Placement Confirmation: ETT inserted through vocal cords under direct vision, positive ETCO2 and breath sounds checked- equal and bilateral Secured at: 22 cm Tube secured with: Tape

## 2023-03-23 NOTE — Anesthesia Postprocedure Evaluation (Signed)
Anesthesia Post Note  Patient: Thomas Mcclure  Procedure(s) Performed: MICRODIRECT LARYNGOSCOPY WITH BIOPSY (Throat)     Patient location during evaluation: PACU Anesthesia Type: General Level of consciousness: awake and alert Pain management: pain level controlled Vital Signs Assessment: post-procedure vital signs reviewed and stable Respiratory status: spontaneous breathing, nonlabored ventilation, respiratory function stable and patient connected to nasal cannula oxygen Cardiovascular status: blood pressure returned to baseline and stable Postop Assessment: no apparent nausea or vomiting Anesthetic complications: no   No notable events documented.  Last Vitals:  Vitals:   03/23/23 1145 03/23/23 1215  BP: 92/63 99/77  Pulse: 76 79  Resp: (!) 8 16  Temp:  (!) 36.2 C  SpO2: 95% 94%    Last Pain:  Vitals:   03/23/23 1215  TempSrc:   PainSc: 0-No pain                 Jeralyn Nolden

## 2023-03-23 NOTE — H&P (Signed)
Thomas Mcclure is an 68 y.o. male.   Chief Complaint: Laryngeal ulcer HPI: 68 year old male with 7 months of sore throat and findings of ulceration of the epiglottis that has not responded to medicines.  He presents for surgical biopsy.  Past Medical History:  Diagnosis Date   Baker's cyst    Left calf   CAD in native artery, 07/15/15 PCI of RCA with DES 07/16/2015   a.   NSTEMI 5/17: LHC - pLAD 20, pLCx 20, OM1 30, pRCA 100, EF 45-50%>> PCI: 2.5 x 24 mm Promus DES to RCA  //  b.   Echo 5/17: mild LVH, EF 50-55%, no RWMA, mod RVE //  c. 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 Quitman Livings [phone]   Iliac artery aneurysm (HCC)    1.5 cm left IIA 09/15/21 CTA; follow-up in ~ 18 months per vascular surgeon   Ischemic cardiomyopathy    a. LV-gram at time of LHC in 6/17 with EF 35-45%  //  b. Echo 7/17: EF 45-50%, inferior HK, grade 1 diastolic dysfunction, mildly dilated aortic root, moderately reduced RVSF, mild RAE   Myocardial infarction (HCC) 2017   Neuromuscular disorder (HCC)    "with nerve damage"   Numbness and tingling of both lower extremities    "on the outside of both sides" (02/18/2017)   Rheumatoid arthritis (HCC)    RA   Tachycardia    Type II diabetes mellitus (HCC)    diet controlled    Past Surgical History:  Procedure Laterality Date   BACK SURGERY     x3   bilateral cataract curgery      CARDIAC CATHETERIZATION N/A 07/15/2015   Procedure: Left Heart Cath and Coronary Angiography;  Surgeon: Peter M Swaziland, MD;  Location: [facility] INVASIVE CV LAB;  Service: Cardiovascular;  Laterality:  N/A;   CARDIAC CATHETERIZATION N/A 07/15/2015   Procedure: Coronary Stent Intervention;  Surgeon: Peter M Swaziland, MD;  Location: [facility] INVASIVE CV LAB;  Service: Cardiovascular;  Laterality: N/A;   CARDIAC CATHETERIZATION N/A 08/07/2015   Procedure: Left Heart Cath and Coronary Angiography;  Surgeon: Lyn Records, MD;  Location: [facility] INVASIVE CV LAB;  Service: Cardiovascular;  Laterality: N/A;   CORONARY ANGIOPLASTY WITH STENT PLACEMENT  07/2015   LEFT HEART CATH AND CORONARY ANGIOGRAPHY N/A 02/19/2017   Procedure: LEFT HEART CATH AND CORONARY ANGIOGRAPHY;  Surgeon: Swaziland, Peter M, MD;  Location: [facility] INVASIVE CV LAB;  Service: Cardiovascular;  Laterality: N/A;   LUMBAR FUSION  04/11/2019   REVISION OF THORACOLUMBAR FUSION    LUMBAR SPINE SURGERY  03/2009  & 2012   MASS EXCISION N/A 04/19/2012   Procedure: removal of posterior cervical lipoma;  Surgeon: Cristi Loron, MD;  Location: [facility] NEURO ORS;  Service: Neurosurgery;  Laterality: N/A;  Removal of posterior cervical lipoma   POPLITEAL SYNOVIAL CYST EXCISION Left    "opened up behind my knee; scraped out arthritis"   POSTERIOR LUMBAR FUSION 4 LEVEL N/A 11/22/2018   Procedure: Posterior Lateral and  Interbody fusion - Lumbar one-Lumbar two; explore fusion, posterior instrumentation and fusion Thoracic ten to the ilium;  Surgeon: Tressie Stalker, MD;  Location: [facility] OR;  Service: Neurosurgery;  Laterality: N/A;   SPINAL CORD STIMULATOR INSERTION N/A 01/07/2013   Procedure:  SPINAL CORD STIMULATOR INSERTION;  Surgeon: Gwynne Edinger, MD;  Location: [facility] NEURO ORS;  Service: Neurosurgery;  Laterality: N/A;   SPINAL CORD STIMULATOR INSERTION N/A 02/09/2019   Procedure: REPLACEMENT OF LUMBAR SPINAL CORD STIMULATOR;  Surgeon: Tressie Stalker, MD;  Location[facility] OR;  Service: Neurosurgery;  Laterality: N/A;  Thoracic/Lumbar spine   TONSILLECTOMY     patient denies   TOTAL HIP ARTHROPLASTY Right 10/03/2016   Procedure: TOTAL HIP ARTHROPLASTY;  Surgeon:  Frederico Hamman, MD;  Location: [facility] OR;  Service: Orthopedics;  Laterality: Right;   TOTAL KNEE ARTHROPLASTY Right 11/09/2020   Procedure: TOTAL KNEE ARTHROPLASTY;  Surgeon: Frederico Hamman, MD;  Location: [facility] ORS;  Service: Orthopedics;  Laterality: Right;   TOTAL KNEE REVISION Right 04/02/2022   Procedure: TOTAL KNEE REVISION;  Surgeon: Joen Laura, MD;  Location: [facility] OR;  Service: Orthopedics;  Laterality: Right;   WISDOM TOOTH EXTRACTION     hx of    Family History  Problem Relation Age of Onset   Heart disease Mother    Colon cancer Brother    Prostate cancer Brother    Rectal cancer Neg Hx    Esophageal cancer Neg Hx    Social History:  reports that he quit smoking about 17 years ago. His smoking use included cigarettes. He started smoking about 57 years ago. He has a 10 pack-year smoking history. He has never used smokeless tobacco. He reports current drug use. He reports that he does not drink alcohol.  Allergies:  Allergies  Allergen Reactions   Aquacel Extra Hydrofiber [Wound Dressings] Other (See Comments)    Blisters to the skin   Brilinta [Ticagrelor] Shortness Of Breath and Other (See Comments)    CHEST PAIN    Methylprednisolone Other (See Comments)    DIAPHORESIS, HYPOTENSION   Prednisone Other (See Comments)    DIAPHORESIS, HYPOTENSION   Toradol [Ketorolac Tromethamine] Other (See Comments)    DIAPHORESIS, HYPOTENSION     Adhesive [Tape] Rash and Other (See Comments)    "blisters" ( NO SURGICAL TAPE, PLEASE)    Medications Prior to Admission  Medication Sig Dispense Refill   acetaminophen (TYLENOL) 500 MG tablet Take 1,000 mg by mouth 2 (two) times daily as needed (pain.).     ascorbic acid (VITAMIN C) 500 MG tablet Take 500 mg by mouth daily.     aspirin EC 81 MG tablet Take 81 mg by mouth daily. Swallow whole.     atorvastatin (LIPITOR) 10 MG tablet Take 1 tablet (10 mg total) by mouth at bedtime. 90 tablet 1   Buprenorphine HCl (BELBUCA) 900 MCG FILM  Dissolve 1 (one) Film inside cheeks two times daily 60 each 0   carvedilol (COREG) 12.5 MG tablet Take 1 tablet (12.5 mg total) by mouth 2 (two) times daily with a meal. 180 tablet 3   chlorthalidone (HYGROTON) 25 MG tablet Take 1 tablet (25 mg total) by mouth daily. **must keep dr appointment in Oct 2024* 90 tablet 3   dapagliflozin propanediol (FARXIGA) 10 MG TABS tablet 1 tab by mouth daily 90 tablet 0   diclofenac Sodium (VOLTAREN) 1 % GEL Apply a small amount to affected area 2 times a day as needed for pain 100 g 5  diltiazem (DILT-XR) 180 MG 24 hr capsule Take 1 capsule (180 mg total) by mouth daily. 90 capsule 3   docusate sodium (COLACE) 100 MG capsule Take 1 capsule (100 mg total) by mouth 2 (two) times daily. (Patient taking differently: Take 300 mg by mouth 2 (two) times daily.) 10 capsule 0   DULoxetine (CYMBALTA) 60 MG capsule Take 1 capsule (60 mg total) by mouth daily. 90 capsule 1   glipiZIDE (GLUCOTROL XL) 10 MG 24 hr tablet Take 2 tablets (20 mg total) by mouth daily with breakfast. 90 tablet 1   linaclotide (LINZESS) 145 MCG CAPS capsule Take 1 capsule (145 mcg total) by mouth daily. 90 capsule 3   linaclotide (LINZESS) 145 MCG CAPS capsule Take 145 mcg by mouth daily before breakfast.     lisinopril (ZESTRIL) 20 MG tablet Take 1 tablet (20 mg total) by mouth daily. 90 tablet 1   lubiprostone (AMITIZA) 24 MCG capsule Take 1 capsule (24 mcg total) by mouth 2 (two) times daily. 180 capsule 0   metFORMIN (GLUCOPHAGE) 1000 MG tablet Take 1 tablet (1,000 mg total) by mouth 2 (two) times daily. 180 tablet 1   methocarbamol (ROBAXIN) 750 MG tablet Take 1 tablet (750 mg total) by mouth 2 (two) times daily as needed. 60 tablet 4   Multiple Vitamin (MULTIVITAMIN WITH MINERALS) TABS tablet Take 1 tablet by mouth daily.     omeprazole (PRILOSEC) 40 MG capsule Take 1 capsule (40 mg total) by mouth daily. 30 capsule 5   Oxycodone HCl 20 MG TABS Take 1 tablet (20 mg total) by mouth every 6  (six) hours. 120 tablet 0   Potassium Chloride ER 20 MEQ TBCR Take 1 tablet (20 mEq total) by mouth daily. 90 tablet 0   pregabalin (LYRICA) 150 MG capsule Take 1 capsule (150 mg total) by mouth 2 (two) times daily. 60 capsule 0   ACCU-CHEK AVIVA PLUS test strip      Accu-Chek Softclix Lancets lancets 4 (four) times daily.     atorvastatin (LIPITOR) 10 MG tablet Take 1 tablet (10 mg total) by mouth at bedtime. (Patient not taking: Reported on 03/18/2023) 90 tablet 1   bisacodyl 5 MG EC tablet Take 1 tablet (5 mg total) by mouth daily as needed for moderate constipation. 8 tablet 0   Buprenorphine HCl (BELBUCA) 900 MCG FILM Place 1 (one) Film inside cheeks two times daily (Patient not taking: Reported on 03/18/2023) 60 each 0   Continuous Blood Gluc Sensor (FREESTYLE LIBRE 2 SENSOR) MISC Use to test blood sugar, change every other week 6 each 4   diclofenac Sodium (VOLTAREN ARTHRITIS PAIN) 1 % GEL Apply small amount to affected area twice a day as needed (Patient not taking: Reported on 03/18/2023) 150 g 2   diclofenac Sodium (VOLTAREN) 1 % GEL Apply small amount to the affected area 3 times a day as needed for pain (Patient not taking: Reported on 03/18/2023) 100 g 2   glipiZIDE (GLUCOTROL XL) 10 MG 24 hr tablet Take 2 tablets (20 mg total) by mouth daily with breakfast. (Patient not taking: Reported on 03/18/2023) 90 tablet 1   glucose blood ([employer] BLOOD GLUCOSE TEST) test strip Use to test blood sugar 4 times a day 120 strip 12   glucose blood ([employer] BLOOD GLUCOSE TEST) test strip Use 1 Strip four times daily, accu-chek aviva plus 120 strip 12   glucose blood test strip Use to test 4 times a day as directed 150 each 11  lubiprostone (AMITIZA) 24 MCG capsule Take 2 capsules (48 mcg total) by mouth at bedtime. (Patient not taking: Reported on 03/18/2023) 180 capsule 0   metFORMIN (GLUCOPHAGE) 1000 MG tablet Take 1 tablet (1,000 mg total) by mouth 2 (two) times daily. (Patient not taking: Reported on  03/18/2023) 180 tablet 1   methocarbamol (ROBAXIN) 500 MG tablet Take 1 tablet (500 mg total) by mouth every 6 (six) hours as needed for pain. (Patient not taking: Reported on 03/18/2023) 30 tablet 0   naloxone (NARCAN) nasal spray 4 mg/0.1 mL Place 1 spray into the nose as needed. If found unresposive then spray this into nose and call 911 immediately 2 each 3   omeprazole (PRILOSEC) 40 MG capsule Take 1 capsule (40 mg total) by mouth daily. (Patient not taking: Reported on 03/18/2023) 90 capsule 0   omeprazole (PRILOSEC) 40 MG capsule Take 1 capsule (40 mg total) by mouth daily. (Patient not taking: Reported on 03/18/2023) 90 capsule 0   oxyCODONE (OXY IR/ROXICODONE) 5 MG immediate release tablet Take 1 tablet (5 mg total) by mouth every 4 (four) hours as needed for pain. (Patient not taking: Reported on 03/18/2023) 30 tablet 0   Oxycodone HCl 20 MG TABS Take 1 tablet (20 mg total) by mouth every 4 (four) hours as needed for pain. (Patient not taking: Reported on 03/18/2023) 40 tablet 0   Oxycodone HCl 20 MG TABS Take 1 tablet (20 mg total) by mouth every 6 (six) hours as needed. (Patient not taking: Reported on 03/18/2023) 120 tablet 0   Oxycodone HCl 20 MG TABS Take 1 tablet (20 mg total) by mouth every 6 (six) hours as needed. (Patient not taking: Reported on 03/18/2023) 120 tablet 0   PEG 3350-KCl-NaCl-NaSulf-MgSul (SUFLAVE) 178.7 g SOLR Take 1 kit by mouth as directed. 1 each 0   Potassium Chloride ER 20 MEQ TBCR Take 1 tablet (20 mEq total) by mouth 2 (two) times daily. (Patient not taking: Reported on 03/18/2023) 14 tablet 0   Potassium Chloride ER 20 MEQ TBCR Take 1 tablet (20 mEq total) by mouth daily. (Patient not taking: Reported on 03/18/2023) 90 tablet 0   pregabalin (LYRICA) 150 MG capsule Take 1 capsule by mouth 2 times daily (Patient not taking: Reported on 03/18/2023) 60 capsule 0   pregabalin (LYRICA) 150 MG capsule Take 1 capsule (150 mg total) by mouth 2 (two) times daily. (Patient not  taking: Reported on 03/18/2023) 60 capsule 0   sildenafil (VIAGRA) 100 MG tablet Take 1 tablet (100 mg total) by mouth daily as needed. 10 tablet 0    Results for orders placed or performed during the hospital encounter of 03/23/23 (from the past 48 hours)  Glucose, capillary     Status: Abnormal   Collection Time: 03/23/23  9:41 AM  Result Value Ref Range   Glucose-Capillary 132 (H) 70 - 99 mg/dL    Comment: Glucose reference range applies only to samples taken after fasting for at least 8 hours.   No results found.  Review of Systems  All other systems reviewed and are negative.   Blood pressure 106/75, pulse 80, temperature 98.2 F (36.8 C), temperature source Temporal, resp. rate 15, height 5\' 9"  (1.753 m), weight 108.7 kg, SpO2 98%. Physical Exam Constitutional:      Appearance: Normal appearance. He is normal weight.  HENT:     Head: Normocephalic and atraumatic.     Right Ear: External ear normal.     Left Ear: External ear normal.  Nose: Nose normal.     Mouth/Throat:     Mouth: Mucous membranes are moist.     Pharynx: Oropharynx is clear.  Eyes:     Extraocular Movements: Extraocular movements intact.     Conjunctiva/sclera: Conjunctivae normal.     Pupils: Pupils are equal, round, and reactive to light.  Cardiovascular:     Rate and Rhythm: Normal rate.  Pulmonary:     Effort: Pulmonary effort is normal.  Musculoskeletal:     Cervical back: Normal range of motion.  Skin:    General: Skin is warm and dry.  Neurological:     General: No focal deficit present.     Mental Status: He is alert and oriented to person, place, and time.  Psychiatric:        Mood and Affect: Mood normal.        Behavior: Behavior normal.        Thought Content: Thought content normal.        Judgment: Judgment normal.      Assessment/Plan Laryngeal ulcer and throat pain  To OR for direct laryngoscopy with biopsy.  Christia Reading, MD 03/23/2023, 10:45 AM

## 2023-03-23 NOTE — Transfer of Care (Signed)
Immediate Anesthesia Transfer of Care Note  Patient: Thomas Mcclure  Procedure(s) Performed: DIRECT LARYNGOSCOPY WITH BIOPSY (Throat)  Patient Location: PACU  Anesthesia Type:General  Level of Consciousness: drowsy and patient cooperative  Airway & Oxygen Therapy: Patient Spontanous Breathing and Patient connected to face mask oxygen  Post-op Assessment: Report given to RN and Post -op Vital signs reviewed and stable  Post vital signs: Reviewed and stable  Last Vitals:  Vitals Value Taken Time  BP 93/64 03/23/23 1123  Temp    Pulse 79 03/23/23 1125  Resp 13 03/23/23 1125  SpO2 96 % 03/23/23 1125  Vitals shown include unfiled device data.  Last Pain:  Vitals:   03/23/23 0907  TempSrc: Temporal  PainSc: 5          Complications: No notable events documented.

## 2023-03-23 NOTE — Anesthesia Preprocedure Evaluation (Addendum)
Anesthesia Evaluation  Patient identified by MRN, date of birth, ID band Patient awake    Reviewed: Allergy & Precautions, NPO status , Patient's Chart, lab work & pertinent test results, reviewed documented beta blocker date and time   History of Anesthesia Complications Negative for: history of anesthetic complications  Airway Mallampati: I  TM Distance: >3 FB Neck ROM: Full    Dental  (+) Edentulous Upper, Edentulous Lower,    Pulmonary former smoker   breath sounds clear to auscultation       Cardiovascular hypertension, Pt. on medications and Pt. on home beta blockers + angina  + CAD, + Past MI, + Cardiac Stents and + Peripheral Vascular Disease   Rhythm:Regular Rate:Normal  '20 ECHO: EF 45-50%. The cavity size was normal. There is mildly  increased left ventricular wall thickness. Left ventricular diastolic  Doppler parameters are consistent with impaired relaxation.   2. Abnormal septal motion inferior basal hypokinesis.   3. The RV has normal systolic function. The cavity was normal. There is no increase in right ventricular wall thickness.   4. Mild thickening of the mitral valve leaflet. Mild calcification of the mitral valve leaflet. There is mild mitral annular calcification present.   5. The aortic valve is tricuspid. Mild thickening of the aortic valve. Sclerosis without any evidence of stenosis of the aortic valve.    '20 Stress:  EF: 42%. The left ventricular EF is moderately decreased (30-44%). There was no ST segment deviation noted during stress. Defect 1: There is a small defect of severe severity present in the basal inferior and mid inferior location. Findings consistent with prior Inferior wall myocardial infarction. This is an intermediate risk study based on reduction of EF . There is no evidence of ischemia.    Neuro/Psych Chronic back pain    GI/Hepatic Neg liver ROS,GERD  Medicated and  Controlled,,  Endo/Other  diabetes, Type 2, Oral Hypoglycemic Agents  BMI 35.44  Renal/GU negative Renal ROS     Musculoskeletal  (+) Arthritis , Osteoarthritis,    Abdominal  (+) + obese  Peds  Hematology negative hematology ROS (+)   Anesthesia Other Findings   Reproductive/Obstetrics                             Anesthesia Physical Anesthesia Plan  ASA: 3  Anesthesia Plan: General   Post-op Pain Management: Tylenol PO (pre-op)*   Induction: Intravenous  PONV Risk Score and Plan: 1 and Ondansetron  Airway Management Planned: Oral ETT  Additional Equipment: None  Intra-op Plan:   Post-operative Plan: Extubation in OR  Informed Consent: I have reviewed the patients History and Physical, chart, labs and discussed the procedure including the risks, benefits and alternatives for the proposed anesthesia with the patient or authorized representative who has indicated his/her understanding and acceptance.       Plan Discussed with: CRNA and Anesthesiologist  Anesthesia Plan Comments:        Anesthesia Quick Evaluation

## 2023-03-23 NOTE — Discharge Instructions (Signed)

## 2023-03-23 NOTE — Op Note (Signed)
PREOPERATIVE DIAGNOSIS:  Throat pain and laryngeal ulcer   POSTOPERATIVE DIAGNOSIS: Throat pain and laryngeal ulcer   PROCEDURE:  Suspended microdirect laryngoscopy with biopsy   SURGEON:  Christia Reading, MD   ANESTHESIA:  General endotracheal anesthesia.   COMPLICATIONS:  None.   INDICATIONS:  The patient is a 68 year old male with a 7 month history of throat pain and has been found to have an ulceration of the epiglottis that has not improved with medicines.  He presents to the operating room for surgical management.   FINDINGS:  Epiglottis mildly thickened with superficial ulceration/inflammation.  No clear mass.  Remainder of pharynx and larynx normal.   DESCRIPTION OF PROCEDURE:  The patient was identified in the holding room, informed consent having been obtained including discussion of risks, benefits and alternatives, the patient was brought to the operative suite and put the operative table in the supine position.  Anesthesia was induced and the patient was intubated by the Anesthesia team without difficulty.  The eyes were taped closed and bed was turned 90 degrees from anesthesia.  A damp gauze was placed over the upper gum and a Dedo laryngoscope was used to evaluate the various areas of the pharynx and larynx.  It was then placed into the supraglottic position and suspended to Mayo stand using the Lewy arm.  Photodocumentation was performed with the zero degree telescope.  An epinephrine-soaked pledget was held against the superior epiglottis for a minute or so.  Two biopsies were then taken from the superior epiglottis using cup forceps.  These were passed to nursing for pathology.  An epinephrine-soaked pledget was again held against the site for a minute or so.  The larynx was sprayed with topical lidocaine.  The laryngoscope was then taking out of suspension and removed from the patient's mouth while suctioning the airway.  The patient was turned back to anesthesia for wakeup, was  extubated, and taken to the recovery room in stable condition.

## 2023-03-24 ENCOUNTER — Encounter (HOSPITAL_BASED_OUTPATIENT_CLINIC_OR_DEPARTMENT_OTHER): Payer: Self-pay | Admitting: Otolaryngology

## 2023-03-24 LAB — SURGICAL PATHOLOGY

## 2023-03-25 ENCOUNTER — Telehealth: Payer: Self-pay | Admitting: Internal Medicine

## 2023-03-25 ENCOUNTER — Other Ambulatory Visit: Payer: Self-pay

## 2023-03-25 ENCOUNTER — Other Ambulatory Visit (HOSPITAL_COMMUNITY): Payer: Self-pay

## 2023-03-25 NOTE — Telephone Encounter (Signed)
Attempted to reach patient/wife; unable to speak with either-left message for patient to call back to the office to provide additional information; Advised to call back to the office at [phone];

## 2023-03-25 NOTE — Telephone Encounter (Signed)
Inbound call from patients wife stating that patient is scheduled to have a colonoscopy on 2/21 and the prep was sent to [facility] and they are going to charge the medication. Wife is requesting for medication to be sent to [facility] instead. Please advise.

## 2023-03-26 ENCOUNTER — Other Ambulatory Visit (HOSPITAL_COMMUNITY): Payer: Self-pay

## 2023-03-26 MED ORDER — POLYETHYLENE GLYCOL 3350 17 GM/SCOOP PO POWD
ORAL | 0 refills | Status: AC
  Filled 2023-03-26: qty 238, 10d supply, fill #0

## 2023-03-26 MED ORDER — BISACODYL 5 MG PO TBEC
DELAYED_RELEASE_TABLET | ORAL | 0 refills | Status: AC
  Filled 2023-03-26: qty 4, 1d supply, fill #0

## 2023-03-27 ENCOUNTER — Other Ambulatory Visit (HOSPITAL_COMMUNITY): Payer: Self-pay

## 2023-03-27 ENCOUNTER — Other Ambulatory Visit: Payer: Self-pay | Admitting: Internal Medicine

## 2023-03-27 NOTE — Telephone Encounter (Signed)
Per previous message - verbal order called in to [facility] pharmacy per patients wife's request- pharmacist reports the patient will pay less than $10 for medications; Pharmacist also stated they would contact the patient to inform when RX was ready for pick up;

## 2023-03-28 ENCOUNTER — Other Ambulatory Visit (HOSPITAL_COMMUNITY): Payer: Self-pay

## 2023-03-29 ENCOUNTER — Other Ambulatory Visit: Payer: Self-pay | Admitting: Internal Medicine

## 2023-03-30 ENCOUNTER — Other Ambulatory Visit (HOSPITAL_COMMUNITY): Payer: Self-pay

## 2023-03-30 MED ORDER — SUFLAVE 178.7 G PO SOLR
1.0000 | ORAL | 0 refills | Status: DC
  Filled 2023-03-30: qty 1, fill #0
  Filled 2023-04-08: qty 2, 7d supply, fill #0
  Filled 2023-05-07: qty 2, 1d supply, fill #0

## 2023-03-30 MED ORDER — SUFLAVE 178.7 G PO SOLR
1.0000 | ORAL | 0 refills | Status: DC
  Filled 2023-03-30: qty 2, 2d supply, fill #0
  Filled 2023-04-14: qty 2, 1d supply, fill #0

## 2023-03-31 ENCOUNTER — Other Ambulatory Visit (HOSPITAL_COMMUNITY): Payer: Self-pay

## 2023-04-02 ENCOUNTER — Other Ambulatory Visit: Payer: 59

## 2023-04-02 ENCOUNTER — Inpatient Hospital Stay: Admission: RE | Admit: 2023-04-02 | Payer: 59 | Source: Ambulatory Visit

## 2023-04-07 ENCOUNTER — Ambulatory Visit: Payer: 59 | Admitting: Vascular Surgery

## 2023-04-08 ENCOUNTER — Other Ambulatory Visit (HOSPITAL_COMMUNITY): Payer: Self-pay

## 2023-04-08 ENCOUNTER — Other Ambulatory Visit: Payer: Self-pay

## 2023-04-13 ENCOUNTER — Other Ambulatory Visit (HOSPITAL_COMMUNITY): Payer: Self-pay

## 2023-04-14 ENCOUNTER — Other Ambulatory Visit: Payer: Self-pay

## 2023-04-14 ENCOUNTER — Other Ambulatory Visit (HOSPITAL_COMMUNITY): Payer: Self-pay

## 2023-04-14 MED ORDER — POTASSIUM CHLORIDE ER 20 MEQ PO TBCR
1.0000 | EXTENDED_RELEASE_TABLET | Freq: Every day | ORAL | 0 refills | Status: DC
  Filled 2023-04-14 – 2023-05-20 (×2): qty 90, 90d supply, fill #0

## 2023-04-16 ENCOUNTER — Telehealth: Payer: Self-pay

## 2023-04-16 ENCOUNTER — Other Ambulatory Visit (HOSPITAL_COMMUNITY): Payer: Self-pay

## 2023-04-16 ENCOUNTER — Encounter: Payer: Self-pay | Admitting: Internal Medicine

## 2023-04-16 MED ORDER — OXYCODONE HCL 20 MG PO TABS
20.0000 mg | ORAL_TABLET | Freq: Four times a day (QID) | ORAL | 0 refills | Status: DC | PRN
  Filled 2023-04-16 – 2023-04-24 (×4): qty 120, 30d supply, fill #0

## 2023-04-16 MED ORDER — BELBUCA 900 MCG BU FILM
900.0000 ug | ORAL_FILM | BUCCAL | 0 refills | Status: DC
Start: 2023-04-16 — End: 2023-05-15
  Filled 2023-04-16 – 2023-04-18 (×2): qty 60, 30d supply, fill #0

## 2023-04-16 MED ORDER — NALOXONE HCL 4 MG/0.1ML NA LIQD
NASAL | 3 refills | Status: AC
Start: 2023-04-16 — End: ?
  Filled 2023-04-16: qty 2, 1d supply, fill #0
  Filled 2023-07-08: qty 2, 30d supply, fill #0

## 2023-04-16 MED ORDER — PREGABALIN 150 MG PO CAPS
150.0000 mg | ORAL_CAPSULE | Freq: Two times a day (BID) | ORAL | 0 refills | Status: DC
  Filled 2023-04-16: qty 60, 30d supply, fill #0

## 2023-04-16 NOTE — Telephone Encounter (Signed)
Dr. Rhea Belton,   I am sending this as an FYI. This patient was scheduled to have a colonoscopy with you tomorrow. I had to r/s him because he missed a whole day of his prep. He has been r/s for 4/8 @ 8:30 AM. Prep instructions have been reviewed with the patients wife.

## 2023-04-16 NOTE — Telephone Encounter (Signed)
Inbound call from patient spouse requesting to speak with a nurse in regards to previous note. Please advise.   Thank you

## 2023-04-16 NOTE — Telephone Encounter (Signed)
2 attempts made to reach patient. Phone forwarding to VM. Left message for patient to call back. Please send me a private chat if she returns call and I can provide the number to forward the call to.

## 2023-04-17 ENCOUNTER — Other Ambulatory Visit (HOSPITAL_COMMUNITY): Payer: Self-pay

## 2023-04-17 ENCOUNTER — Other Ambulatory Visit: Payer: Self-pay

## 2023-04-17 ENCOUNTER — Encounter: Payer: 59 | Admitting: Internal Medicine

## 2023-04-17 DIAGNOSIS — Z1211 Encounter for screening for malignant neoplasm of colon: Secondary | ICD-10-CM

## 2023-04-17 MED ORDER — BISACODYL 5 MG PO TBEC
5.0000 mg | DELAYED_RELEASE_TABLET | ORAL | 0 refills | Status: DC
  Filled 2023-04-17: qty 4, 1d supply, fill #0

## 2023-04-18 ENCOUNTER — Other Ambulatory Visit (HOSPITAL_COMMUNITY): Payer: Self-pay

## 2023-04-20 ENCOUNTER — Other Ambulatory Visit (HOSPITAL_COMMUNITY): Payer: Self-pay

## 2023-04-22 ENCOUNTER — Other Ambulatory Visit (HOSPITAL_COMMUNITY): Payer: Self-pay

## 2023-04-23 ENCOUNTER — Other Ambulatory Visit (HOSPITAL_COMMUNITY): Payer: Self-pay

## 2023-04-23 ENCOUNTER — Other Ambulatory Visit: Payer: Self-pay

## 2023-04-24 ENCOUNTER — Other Ambulatory Visit (HOSPITAL_COMMUNITY): Payer: Self-pay

## 2023-04-30 ENCOUNTER — Ambulatory Visit
Admission: RE | Admit: 2023-04-30 | Discharge: 2023-04-30 | Disposition: A | Discharge status: Home / Self Care | Payer: 59 | Source: Ambulatory Visit | Attending: Vascular Surgery | Admitting: Vascular Surgery

## 2023-04-30 DIAGNOSIS — I723 Aneurysm of iliac artery: Secondary | ICD-10-CM

## 2023-04-30 MED ORDER — IOPAMIDOL (ISOVUE-370) INJECTION 76%
100.0000 mL | Freq: Once | INTRAVENOUS | Status: AC | PRN
  Administered 2023-04-30: 75 mL via INTRAVENOUS

## 2023-05-04 NOTE — Progress Notes (Unsigned)
 Patient name: Thomas Mcclure MRN: [id number] DOB: 12-30-1955 Sex: male  REASON FOR CONSULT: 44-month follow-up for left internal iliac aneurysm  HPI: Thomas Mcclure is a 68 y.o. male, with history of coronary artery disease, CHF, hypertension, hyperlipidemia, diabetes, rheumatoid arthritis who presents for 56-month follow-up for left internal iliac aneurysm.  Last saw Dr. Edilia Bo on 10/04/2022.  He was scheduled for 40-month follow-up.  He had a CTA on 04/30/2023 showing a stable 1.7 cm iliac aneurysm on the left.  Reports no new issues.  No new abdominal pain.  Past Medical History:  Diagnosis Date   Baker's cyst    Left calf   CAD in native artery, 07/15/15 PCI of RCA with DES 07/16/2015   a.   NSTEMI 5/17: LHC - pLAD 20, pLCx 20, OM1 30, pRCA 100, EF 45-50%>> PCI: 2.5 x 24 mm Promus DES to RCA  //  b.   Echo 5/17: mild LVH, EF 50-55%, no RWMA, mod RVE //  c. 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 Quitman Livings [phone]   Iliac artery aneurysm (HCC)    1.5 cm left IIA 09/15/21 CTA; follow-up in ~ 18 months per vascular surgeon   Ischemic cardiomyopathy    a. LV-gram at time of LHC in 6/17 with EF 35-45%  //  b. Echo 7/17: EF 45-50%, inferior HK, grade 1 diastolic dysfunction, mildly dilated aortic root, moderately reduced RVSF, mild RAE   Myocardial infarction (HCC) 2017   Neuromuscular disorder (HCC)    "with nerve damage"   Numbness and tingling of both lower extremities    "on the outside of both sides" (02/18/2017)   Rheumatoid arthritis (HCC)    RA   Tachycardia    Type II diabetes mellitus  (HCC)    diet controlled    Past Surgical History:  Procedure Laterality Date   BACK SURGERY     x3   bilateral cataract curgery      CARDIAC CATHETERIZATION N/A 07/15/2015   Procedure: Left Heart Cath and Coronary Angiography;  Surgeon: Peter M Swaziland, MD;  Location: [facility] INVASIVE CV LAB;  Service: Cardiovascular;  Laterality: N/A;   CARDIAC CATHETERIZATION N/A 07/15/2015   Procedure: Coronary Stent Intervention;  Surgeon: Peter M Swaziland, MD;  Location: [facility] INVASIVE CV LAB;  Service: Cardiovascular;  Laterality: N/A;   CARDIAC CATHETERIZATION N/A 08/07/2015   Procedure: Left Heart Cath and Coronary Angiography;  Surgeon: Lyn Records, MD;  Location: [facility] INVASIVE CV LAB;  Service: Cardiovascular;  Laterality: N/A;   CORONARY ANGIOPLASTY WITH STENT PLACEMENT  07/2015   DIRECT LARYNGOSCOPY N/A 03/23/2023   Procedure: MICRODIRECT LARYNGOSCOPY WITH BIOPSY;  Surgeon: Christia Reading, MD;  Location: [facility];  Service: ENT;  Laterality: N/A;   LEFT HEART CATH AND CORONARY ANGIOGRAPHY N/A 02/19/2017   Procedure: LEFT HEART CATH AND CORONARY ANGIOGRAPHY;  Surgeon: Swaziland, Peter M, MD;  Location: [facility] INVASIVE CV LAB;  Service: Cardiovascular;  Laterality: N/A;   LUMBAR FUSION  04/11/2019   REVISION OF  THORACOLUMBAR FUSION    LUMBAR SPINE SURGERY  03/2009  & 2012   MASS EXCISION N/A 04/19/2012   Procedure: removal of posterior cervical lipoma;  Surgeon: Cristi Loron, MD;  Location: [facility] NEURO ORS;  Service: Neurosurgery;  Laterality: N/A;  Removal of posterior cervical lipoma   POPLITEAL SYNOVIAL CYST EXCISION Left    "opened up behind my knee; scraped out arthritis"   POSTERIOR LUMBAR FUSION 4 LEVEL N/A 11/22/2018   Procedure: Posterior Lateral and Interbody fusion - Lumbar one-Lumbar two; explore fusion, posterior instrumentation and fusion Thoracic ten to the ilium;  Surgeon: Tressie Stalker, MD;  Location: [facility] OR;  Service: Neurosurgery;  Laterality: N/A;   SPINAL CORD STIMULATOR  INSERTION N/A 01/07/2013   Procedure:  SPINAL CORD STIMULATOR INSERTION;  Surgeon: Gwynne Edinger, MD;  Location: [facility] NEURO OR[facility];  Service: Neurosurgery;  Laterality: N/A;   SPINAL CORD STIMULATOR INSERTION N/A 02/09/2019   Procedure: REPLACEMENT OF LUMBAR SPINAL CORD STIMULATOR;  Surgeon: Tressie Stalker, MD;  Location: [facility] OR;  Service: Neurosurgery;  Laterality: N/A;  Thoracic/Lumbar spine   TONSILLECTOMY     patient denies   TOTAL HIP ARTHROPLASTY Right 10/03/2016   Procedure: TOTAL HIP ARTHROPLASTY;  Surgeon: Frederico Hamman, MD;  Location: [facility] OR;  Service: Orthopedics;  Laterality: Right;   TOTAL KNEE ARTHROPLASTY Right 11/09/2020   Procedure: TOTAL KNEE ARTHROPLASTY;  Surgeon: Frederico Hamman, MD;  Location: [facility] ORS;  Service: Orthopedics;  Laterality: Right;   TOTAL KNEE REVISION Right 04/02/2022   Procedure: TOTAL KNEE REVISION;  Surgeon: Joen Laura, MD;  Location: [facility] OR;  Service: Orthopedics;  Laterality: Right;   WISDOM TOOTH EXTRACTION     hx of    Family History  Problem Relation Age of Onset   Heart disease Mother    Colon cancer Brother    Prostate cancer Brother    Rectal cancer Neg Hx    Esophageal cancer Neg Hx     SOCIAL HISTORY: Social History   Socioeconomic History   Marital status: Married    Spouse name: Not on file   Number of children: 5   Years of education: Not on file   Highest education level: Not on file  Occupational History   Occupation: Psychologist, forensic, now on disability due to back pain  Tobacco Use   Smoking status: Former    Current packs/day: 0.00    Average packs/day: 0.3 packs/day for 40.0 years (10.0 ttl pk-yrs)    Types: Cigarettes    Start date: 80    Quit date: 2008    Years since quitting: 17.2   Smokeless tobacco: Never   Tobacco comments:    02/18/2017 "quit in 2012"  Vaping Use   Vaping status: Never Used  Substance and Sexual Activity   Alcohol use: No    Alcohol/week: 0.0 standard drinks of alcohol    Drug use: Yes    Comment: chronic opioids for chronic back pain   Sexual activity: Not Currently  Other Topics Concern   Not on file  Social History Narrative   Not on file   Social Drivers of Health   Financial Resource Strain: Not on File (06/13/2021)   Received from [employer] [employer]    Financial Resource Strain: 0  Food Insecurity: Low Risk  (12/12/2022)   Received from [facility]   Hunger Vital Sign    Worried About Running Out of Food in the Last Year: Never true    Ran Out  of Food in the Last Year: Never true  Transportation Needs: No Transportation Needs (12/12/2022)   Received from [facility]    In the past 12 months, has lack of reliable transportation kept you from medical appointments, meetings, work or from getting things needed for daily living? : No  Physical Activity: Not on File (06/13/2021)   Received from [place], [place]   Physical Activity    Physical Activity: 0  Stress: Not on File (06/13/2021)   Received from [facility], [place]   Stress    Stress: 0  Social Connections: Not on File (11/08/2022)   Received from [facility]   Social Connections    Connectedness: 0  Intimate Partner Violence: Not At Risk (04/02/2022)   Humiliation, Afraid, Rape, and Kick questionnaire    Fear of Current or Ex-Partner: No    Emotionally Abused: No    Physically Abused: No    Sexually Abused: No    Allergies  Allergen Reactions   Aquacel Extra Hydrofiber [Wound Dressings] Other (See Comments)    Blisters to the skin   Brilinta [Ticagrelor] Shortness Of Breath and Other (See Comments)    CHEST PAIN    Methylprednisolone Other (See Comments)    DIAPHORESIS, HYPOTENSION   Prednisone Other (See Comments)    DIAPHORESIS, HYPOTENSION   Toradol [Ketorolac Tromethamine] Other (See Comments)    DIAPHORESIS, HYPOTENSION     Adhesive [Tape] Rash and Other (See Comments)    "blisters" ( NO SURGICAL TAPE, PLEASE)    Current Outpatient  Medications  Medication Sig Dispense Refill   ACCU-CHEK AVIVA PLUS test strip      Accu-Chek Softclix Lancets lancets 4 (four) times daily.     acetaminophen (TYLENOL) 500 MG tablet Take 1,000 mg by mouth 2 (two) times daily as needed (pain.).     ascorbic acid (VITAMIN C) 500 MG tablet Take 500 mg by mouth daily.     aspirin EC 81 MG tablet Take 81 mg by mouth daily. Swallow whole.     atorvastatin (LIPITOR) 10 MG tablet Take 1 tablet (10 mg total) by mouth at bedtime. (Patient not taking: Reported on 03/18/2023) 90 tablet 1   atorvastatin (LIPITOR) 10 MG tablet Take 1 tablet (10 mg total) by mouth at bedtime. 90 tablet 1   bisacodyl (DULCOLAX) 5 MG EC tablet Take by mouth as directed. 4 tablet 0   bisacodyl 5 MG EC tablet Take 4 tablets by mouth as directed on your prep instructions @ 3 PM on 4/6. 4 tablet 0   Buprenorphine HCl (BELBUCA) 900 MCG FILM Place 1 (one) Film inside cheeks two times daily (Patient not taking: Reported on 03/18/2023) 60 each 0   Buprenorphine HCl (BELBUCA) 900 MCG FILM Place 1 (one) film inside cheek two times daily 60 each 0   carvedilol (COREG) 12.5 MG tablet Take 1 tablet (12.5 mg total) by mouth 2 (two) times daily with a meal. 180 tablet 3   chlorthalidone (HYGROTON) 25 MG tablet Take 1 tablet (25 mg total) by mouth daily. **must keep dr appointment in Oct 2024* 90 tablet 3   Continuous Blood Gluc Sensor (FREESTYLE LIBRE 2 SENSOR) MISC Use to test blood sugar, change every other week 6 each 4   dapagliflozin propanediol (FARXIGA) 10 MG TABS tablet Take 1 tablet (10 mg total) by mouth daily. 90 tablet 0   diclofenac Sodium (VOLTAREN ARTHRITIS PAIN) 1 % GEL Apply small amount to affected area twice a day as needed (Patient not taking: Reported  on 03/18/2023) 150 g 2   diclofenac Sodium (VOLTAREN) 1 % GEL Apply small amount to the affected area 3 times a day as needed for pain (Patient not taking: Reported on 03/18/2023) 100 g 2   diclofenac Sodium (VOLTAREN) 1 % GEL  Apply a small amount to affected area 2 times a day as needed for pain 100 g 5   diltiazem (DILT-XR) 180 MG 24 hr capsule Take 1 capsule (180 mg total) by mouth daily. 90 capsule 3   docusate sodium (COLACE) 100 MG capsule Take 1 capsule (100 mg total) by mouth 2 (two) times daily. (Patient taking differently: Take 300 mg by mouth 2 (two) times daily.) 10 capsule 0   DULoxetine (CYMBALTA) 60 MG capsule Take 1 capsule (60 mg total) by mouth daily. 90 capsule 1   glipiZIDE (GLUCOTROL XL) 10 MG 24 hr tablet Take 2 tablets (20 mg total) by mouth daily with breakfast. 90 tablet 1   glipiZIDE (GLUCOTROL XL) 10 MG 24 hr tablet Take 2 tablets (20 mg total) by mouth daily with breakfast. (Patient not taking: Reported on 03/18/2023) 90 tablet 1   glucose blood ([employer] BLOOD GLUCOSE TEST) test strip Use to test blood sugar 4 times a day 120 strip 12   glucose blood ([employer] BLOOD GLUCOSE TEST) test strip Use 1 Strip four times daily, accu-chek aviva plus 120 strip 12   glucose blood test strip Use to test 4 times a day as directed 150 each 11   linaclotide (LINZESS) 145 MCG CAPS capsule Take 1 capsule (145 mcg total) by mouth daily. 90 capsule 3   linaclotide (LINZESS) 145 MCG CAPS capsule Take 145 mcg by mouth daily before breakfast.     lisinopril (ZESTRIL) 20 MG tablet Take 1 tablet (20 mg total) by mouth daily. 90 tablet 1   lubiprostone (AMITIZA) 24 MCG capsule Take 2 capsules (48 mcg total) by mouth at bedtime. (Patient not taking: Reported on 03/18/2023) 180 capsule 0   lubiprostone (AMITIZA) 24 MCG capsule Take 1 capsule (24 mcg total) by mouth 2 (two) times daily. 180 capsule 0   metFORMIN (GLUCOPHAGE) 1000 MG tablet Take 1 tablet (1,000 mg total) by mouth 2 (two) times daily. (Patient not taking: Reported on 03/18/2023) 180 tablet 1   metFORMIN (GLUCOPHAGE) 1000 MG tablet Take 1 tablet (1,000 mg total) by mouth 2 (two) times daily. 180 tablet 1   methocarbamol (ROBAXIN) 500 MG tablet Take 1 tablet (500 mg  total) by mouth every 6 (six) hours as needed for pain. (Patient not taking: Reported on 03/18/2023) 30 tablet 0   methocarbamol (ROBAXIN) 750 MG tablet Take 1 tablet (750 mg total) by mouth 2 (two) times daily as needed. 60 tablet 4   Multiple Vitamin (MULTIVITAMIN WITH MINERALS) TABS tablet Take 1 tablet by mouth daily.     naloxone (NARCAN) nasal spray 4 mg/0.1 mL Place 1 spray into the nose as needed. If found unresposive then spray this into nose and call 911 immediately 2 each 3   naloxone (NARCAN) nasal spray 4 mg/0.1 mL Use 1 (one)  spray as needed, IF FOUND UNRESPONSIVE THEN SPRAY THIS INTO NOSE AND CALL 911 IMMEDIATELY 2 each 3   omeprazole (PRILOSEC) 40 MG capsule Take 1 capsule (40 mg total) by mouth daily. (Patient not taking: Reported on 03/18/2023) 90 capsule 0   omeprazole (PRILOSEC) 40 MG capsule Take 1 capsule (40 mg total) by mouth daily. (Patient not taking: Reported on 03/18/2023) 90 capsule 0   omeprazole (  PRILOSEC) 40 MG capsule Take 1 capsule (40 mg total) by mouth daily. 30 capsule 5   oxyCODONE (OXY IR/ROXICODONE) 5 MG immediate release tablet Take 1 tablet (5 mg total) by mouth every 4 (four) hours as needed for pain. (Patient not taking: Reported on 03/18/2023) 30 tablet 0   Oxycodone HCl 20 MG TABS Take 1 tablet (20 mg total) by mouth every 4 (four) hours as needed for pain. (Patient not taking: Reported on 03/18/2023) 40 tablet 0   Oxycodone HCl 20 MG TABS Take 1 tablet (20 mg total) by mouth every 6 (six) hours as needed. (Patient not taking: Reported on 03/18/2023) 120 tablet 0   Oxycodone HCl 20 MG TABS Take 1 tablet (20 mg total) by mouth every 6 (six) hours as needed. (Patient not taking: Reported on 03/18/2023) 120 tablet 0   Oxycodone HCl 20 MG TABS Take 1 tablet (20 mg total) by mouth every 6 (six) hours. 120 tablet 0   Oxycodone HCl 20 MG TABS Take 1 tablet (20 mg total) by mouth every 6 (six) hours as needed. 120 tablet 0   PEG 3350-KCl-NaCl-NaSulf-MgSul (SUFLAVE) 178.7  g SOLR Take as directed. 2 each 0   PEG 3350-KCl-NaCl-NaSulf-MgSul (SUFLAVE) 178.7 g SOLR Take 1 kit by mouth as directed. 2 each 0   polyethylene glycol powder (MIRALAX) 17 GM/SCOOP powder Take as directed 238 g 0   Potassium Chloride ER 20 MEQ TBCR Take 1 tablet (20 mEq total) by mouth 2 (two) times daily. (Patient not taking: Reported on 03/18/2023) 14 tablet 0   Potassium Chloride ER 20 MEQ TBCR Take 1 tablet (20 mEq total) by mouth daily. (Patient not taking: Reported on 03/18/2023) 90 tablet 0   Potassium Chloride ER 20 MEQ TBCR Take 1 tablet (20 mEq total) by mouth daily. 90 tablet 0   pregabalin (LYRICA) 150 MG capsule Take 1 capsule by mouth 2 times daily (Patient not taking: Reported on 03/18/2023) 60 capsule 0   pregabalin (LYRICA) 150 MG capsule Take 1 capsule (150 mg total) by mouth 2 (two) times daily. (Patient not taking: Reported on 03/18/2023) 60 capsule 0   pregabalin (LYRICA) 150 MG capsule Take 1 capsule (150 mg total) by mouth 2 (two) times daily. 60 capsule 0   sildenafil (VIAGRA) 100 MG tablet Take 1 tablet (100 mg total) by mouth daily as needed. 10 tablet 0   No current facility-administered medications for this visit.    REVIEW OF SYSTEMS:  [X]  denotes positive finding, [ ]  denotes negative finding Cardiac  Comments:  Chest pain or chest pressure:    Shortness of breath upon exertion:    Short of breath when lying flat:    Irregular heart rhythm:        Vascular    Pain in calf, thigh, or hip brought on by ambulation:    Pain in feet at night that wakes you up from your sleep:     Blood clot in your veins:    Leg swelling:         Pulmonary    Oxygen at home:    Productive cough:     Wheezing:         Neurologic    Sudden weakness in arms or legs:     Sudden numbness in arms or legs:     Sudden onset of difficulty speaking or slurred speech:    Temporary loss of vision in one eye:     Problems with dizziness:  Gastrointestinal    Blood in stool:      Vomited blood:         Genitourinary    Burning when urinating:     Blood in urine:        Psychiatric    Major depression:         Hematologic    Bleeding problems:    Problems with blood clotting too easily:        Skin    Rashes or ulcers:        Constitutional    Fever or chills:      PHYSICAL EXAM: There were no vitals filed for this visit.  GENERAL: The patient is a well-nourished male, in no acute distress. The vital signs are documented above. CARDIAC: There is a regular rate and rhythm.  VASCULAR:  Bilateral femoral pulses palpable Bilateral DP pulses 1+ palpable PULMONARY: No respiratory distress. ABDOMEN: Soft and non-tender. MUSCULOSKELETAL: There are no major deformities or cyanosis. NEUROLOGIC: No focal weakness or paresthesias are detected. PSYCHIATRIC: The patient has a normal affect.  DATA:   CTA abdomen pelvis 04/30/2023  IMPRESSION: VASCULAR   1. No significant interval change in the size or appearance of the small aneurysmal dilation of the proximal left internal iliac artery. Maximal diameter is 1.7 cm which is unchanged by my measurements. The previously reported measurement of 1.5 cm was an underestimate. 2. Extensive scattered calcified atherosclerotic plaque without significant stenosis or occlusion. 3.  Aortic Atherosclerosis (ICD10-I70.0).   NON-VASCULAR   1. No acute abnormality within the abdomen or pelvis. 2. Ancillary findings as above without interval change.    Assessment/Plan:  68 year old male presents for 42-month follow-up for small left internal iliac aneurysm.  I agree with radiology's measurements of 1.7 cm in maximal diameter.  This is really unchanged from 18 months ago.  Will continue to monitor.  I discussed no indication for surgical intervention at this time.  I discussed no change over the last 18 months.  I discussed typically recommend surgical intervention when greater than 3 cm for internal iliac artery  aneurysm.  Will arrange follow-up CTA in 2 years given stability over the last 18 months.  Cephus Shelling, MD [facility] Office: [phone]

## 2023-05-05 ENCOUNTER — Ambulatory Visit (INDEPENDENT_AMBULATORY_CARE_PROVIDER_SITE_OTHER): Payer: 59 | Admitting: Vascular Surgery

## 2023-05-05 ENCOUNTER — Encounter: Payer: Self-pay | Admitting: Vascular Surgery

## 2023-05-05 ENCOUNTER — Other Ambulatory Visit (HOSPITAL_COMMUNITY): Payer: Self-pay

## 2023-05-05 ENCOUNTER — Other Ambulatory Visit: Payer: Self-pay

## 2023-05-05 VITALS — BP 109/78 | HR 82 | Temp 98.1°F | Resp 20 | Ht 69.0 in | Wt 242.2 lb

## 2023-05-05 DIAGNOSIS — I723 Aneurysm of iliac artery: Secondary | ICD-10-CM | POA: Insufficient documentation

## 2023-05-08 ENCOUNTER — Other Ambulatory Visit (HOSPITAL_COMMUNITY): Payer: Self-pay

## 2023-05-08 ENCOUNTER — Other Ambulatory Visit: Payer: Self-pay

## 2023-05-12 ENCOUNTER — Other Ambulatory Visit (HOSPITAL_COMMUNITY): Payer: Self-pay

## 2023-05-15 ENCOUNTER — Other Ambulatory Visit (HOSPITAL_COMMUNITY): Payer: Self-pay

## 2023-05-15 MED ORDER — PREGABALIN 150 MG PO CAPS
150.0000 mg | ORAL_CAPSULE | Freq: Two times a day (BID) | ORAL | 0 refills | Status: DC
  Filled 2023-05-15 – 2023-05-18 (×4): qty 60, 30d supply, fill #0

## 2023-05-15 MED ORDER — BELBUCA 900 MCG BU FILM
1.0000 | ORAL_FILM | Freq: Two times a day (BID) | BUCCAL | 0 refills | Status: DC
  Filled 2023-05-15 – 2023-05-18 (×2): qty 60, 30d supply, fill #0

## 2023-05-15 MED ORDER — OXYCODONE HCL 20 MG PO TABS
20.0000 mg | ORAL_TABLET | Freq: Four times a day (QID) | ORAL | 0 refills | Status: DC | PRN
Start: 2023-05-14 — End: 2023-06-16
  Filled 2023-05-25: qty 120, 30d supply, fill #0

## 2023-05-16 ENCOUNTER — Other Ambulatory Visit (HOSPITAL_COMMUNITY): Payer: Self-pay

## 2023-05-18 ENCOUNTER — Other Ambulatory Visit: Payer: Self-pay

## 2023-05-18 ENCOUNTER — Other Ambulatory Visit (HOSPITAL_COMMUNITY): Payer: Self-pay

## 2023-05-21 ENCOUNTER — Other Ambulatory Visit (HOSPITAL_COMMUNITY): Payer: Self-pay

## 2023-05-21 ENCOUNTER — Other Ambulatory Visit: Payer: Self-pay

## 2023-05-21 MED ORDER — DILTIAZEM HCL ER 180 MG PO CP24
180.0000 mg | ORAL_CAPSULE | Freq: Every day | ORAL | 0 refills | Status: AC
  Filled 2023-05-21 – 2023-09-22 (×5): qty 30, 30d supply, fill #0

## 2023-05-21 MED ORDER — GLUCOSE BLOOD VI STRP
ORAL_STRIP | 12 refills | Status: AC
Start: 2023-05-21 — End: ?
  Filled 2023-05-21: qty 150, 37d supply, fill #0
  Filled 2023-06-08: qty 100, 25d supply, fill #0
  Filled 2023-07-08: qty 100, 25d supply, fill #1
  Filled 2023-08-09: qty 100, 25d supply, fill #2

## 2023-05-21 MED ORDER — SILDENAFIL CITRATE 100 MG PO TABS
100.0000 mg | ORAL_TABLET | Freq: Every day | ORAL | 0 refills | Status: DC
  Filled 2023-05-21: qty 30, 30d supply, fill #0

## 2023-05-21 MED ORDER — LISINOPRIL 20 MG PO TABS
20.0000 mg | ORAL_TABLET | Freq: Every day | ORAL | 1 refills | Status: AC
  Filled 2023-05-21 – 2023-11-07 (×3): qty 90, 90d supply, fill #0

## 2023-05-21 MED ORDER — LINZESS 145 MCG PO CAPS
145.0000 ug | ORAL_CAPSULE | Freq: Every day | ORAL | 3 refills | Status: AC
  Filled 2023-05-21 – 2023-08-13 (×4): qty 90, 90d supply, fill #0

## 2023-05-21 MED ORDER — DAPAGLIFLOZIN PROPANEDIOL 10 MG PO TABS
10.0000 mg | ORAL_TABLET | Freq: Every day | ORAL | 0 refills | Status: DC
  Filled 2023-05-21 – 2023-07-04 (×2): qty 90, 90d supply, fill #0

## 2023-05-21 MED ORDER — METFORMIN HCL 1000 MG PO TABS
1000.0000 mg | ORAL_TABLET | Freq: Two times a day (BID) | ORAL | 1 refills | Status: AC
  Filled 2023-05-21 – 2023-11-10 (×4): qty 180, 90d supply, fill #0

## 2023-05-21 MED ORDER — ATORVASTATIN CALCIUM 10 MG PO TABS
10.0000 mg | ORAL_TABLET | Freq: Every evening | ORAL | 1 refills | Status: DC
  Filled 2023-05-21 – 2023-10-18 (×6): qty 90, 90d supply, fill #0
  Filled 2023-12-31 – 2024-01-21 (×3): qty 90, 90d supply, fill #1

## 2023-05-21 MED ORDER — GLIPIZIDE ER 10 MG PO TB24
20.0000 mg | ORAL_TABLET | Freq: Every day | ORAL | 1 refills | Status: AC
  Filled 2023-05-21: qty 90, 45d supply, fill #0
  Filled 2023-07-08 – 2023-10-18 (×4): qty 90, 45d supply, fill #1

## 2023-05-21 MED ORDER — DULOXETINE HCL 60 MG PO CPEP
60.0000 mg | ORAL_CAPSULE | Freq: Every day | ORAL | 1 refills | Status: AC
  Filled 2023-05-21: qty 90, 90d supply, fill #0
  Filled 2023-08-23: qty 90, 90d supply, fill #1

## 2023-05-21 MED ORDER — POTASSIUM CHLORIDE ER 20 MEQ PO TBCR
1.0000 | EXTENDED_RELEASE_TABLET | Freq: Every day | ORAL | 0 refills | Status: DC
  Filled 2023-05-21 – 2023-08-15 (×4): qty 90, 90d supply, fill #0

## 2023-05-21 MED ORDER — OMEPRAZOLE 40 MG PO CPDR
40.0000 mg | DELAYED_RELEASE_CAPSULE | Freq: Every day | ORAL | 1 refills | Status: AC
  Filled 2023-05-21 – 2023-12-06 (×4): qty 90, 90d supply, fill #0

## 2023-05-21 MED ORDER — MUPIROCIN 2 % EX OINT
TOPICAL_OINTMENT | CUTANEOUS | 0 refills | Status: DC
Start: 2023-05-21 — End: 2023-07-28
  Filled 2023-05-22: qty 22, 30d supply, fill #0

## 2023-05-21 MED ORDER — LUBIPROSTONE 24 MCG PO CAPS
48.0000 ug | ORAL_CAPSULE | Freq: Every evening | ORAL | 1 refills | Status: DC
  Filled 2023-05-21: qty 180, 90d supply, fill #0
  Filled 2023-08-13 – 2023-08-15 (×2): qty 180, 90d supply, fill #1

## 2023-05-23 ENCOUNTER — Other Ambulatory Visit (HOSPITAL_BASED_OUTPATIENT_CLINIC_OR_DEPARTMENT_OTHER): Payer: Self-pay

## 2023-05-23 ENCOUNTER — Other Ambulatory Visit (HOSPITAL_COMMUNITY): Payer: Self-pay

## 2023-05-25 ENCOUNTER — Other Ambulatory Visit (HOSPITAL_COMMUNITY): Payer: Self-pay

## 2023-05-25 ENCOUNTER — Other Ambulatory Visit: Payer: Self-pay

## 2023-05-28 ENCOUNTER — Telehealth: Payer: Self-pay | Admitting: Internal Medicine

## 2023-05-28 NOTE — Telephone Encounter (Signed)
 Spoke with the patient and his wife all question answered

## 2023-05-28 NOTE — Telephone Encounter (Signed)
 Patient wife called and stated that she was need to go over her husband prep instruction for his procedure on April the 8th. Patient wife stated that she would like to know if her husband is suppose to take 4 bottle of the prep and what medication is he allowed to take. Patient wife also stated that if she does not not to please try to call a second time due to the living in an a area that sometime don't get call until the second try. Please advise.

## 2023-06-02 ENCOUNTER — Encounter: Payer: Self-pay | Admitting: Internal Medicine

## 2023-06-02 ENCOUNTER — Ambulatory Visit (AMBULATORY_SURGERY_CENTER): Payer: 59 | Admitting: Internal Medicine

## 2023-06-02 VITALS — BP 114/80 | HR 70 | Temp 98.3°F | Resp 17 | Ht 69.0 in | Wt 244.0 lb

## 2023-06-02 DIAGNOSIS — Z1211 Encounter for screening for malignant neoplasm of colon: Secondary | ICD-10-CM | POA: Diagnosis not present

## 2023-06-02 DIAGNOSIS — Q438 Other specified congenital malformations of intestine: Secondary | ICD-10-CM

## 2023-06-02 MED ORDER — SODIUM CHLORIDE 0.9 % IV SOLN: 500.0000 mL | Freq: Once | INTRAVENOUS | Status: DC

## 2023-06-02 NOTE — Progress Notes (Signed)
 GASTROENTEROLOGY PROCEDURE H&P NOTE   Primary Care Physician: Bettey Costa, MD    Reason for Procedure:  Colon cancer screening  Plan:    Colonoscopy   Patient is appropriate for endoscopic procedure(s) in the ambulatory ([facility]) setting.  The nature of the procedure, as well as the risks, benefits, and alternatives were carefully and thoroughly reviewed with the patient. Ample time for discussion and questions allowed. The patient understood, was satisfied, and agreed to proceed.     HPI: Thomas Mcclure is a 68 y.o. male who presents for colonoscopy.  Medical history as below.  Tolerated the prep.  No recent chest pain or shortness of breath.  No abdominal pain today.  Past Medical History:  Diagnosis Date   Baker's cyst    Left calf   CAD in native artery, 07/15/15 PCI of RCA with DES 07/16/2015   a.   NSTEMI 5/17: LHC - pLAD 20, pLCx 20, OM1 30, pRCA 100, EF 45-50%>> PCI: 2.5 x 24 mm Promus DES to RCA  //  b.   Echo 5/17: mild LVH, EF 50-55%, no RWMA, mod RVE //  c. 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 Quitman Livings [phone]   Iliac artery aneurysm (HCC)    1.5 cm left IIA 09/15/21 CTA; follow-up in ~ 18 months per vascular surgeon   Ischemic cardiomyopathy    a. LV-gram at time of LHC in 6/17 with EF 35-45%  //  b. Echo 7/17: EF 45-50%, inferior HK, grade 1 diastolic dysfunction, mildly dilated aortic root, moderately reduced RVSF, mild RAE   Myocardial infarction (HCC) 2017   Neuromuscular disorder (HCC)    "with nerve damage"   Numbness and tingling of both lower extremities    "on  the outside of both sides" (02/18/2017)   Peripheral arterial disease (HCC)    Rheumatoid arthritis (HCC)    RA   Tachycardia    Type II diabetes mellitus (HCC)    diet controlled    Past Surgical History:  Procedure Laterality Date   BACK SURGERY     x3   bilateral cataract curgery      CARDIAC CATHETERIZATION N/A 07/15/2015   Procedure: Left Heart Cath and Coronary Angiography;  Surgeon: Peter M Swaziland, MD;  Location: [facility];  Service: Cardiovascular;  Laterality: N/A;   CARDIAC CATHETERIZATION N/A 07/15/2015   Procedure: Coronary Stent Intervention;  Surgeon: Peter M Swaziland, MD;  Location: [facility] INVASIVE CV LAB;  Service: Cardiovascular;  Laterality: N/A;   CARDIAC CATHETERIZATION N/A 08/07/2015   Procedure: Left Heart Cath and Coronary Angiography;  Surgeon: Lyn Records, MD;  Location: [facility] INVASIVE CV LAB;  Service: Cardiovascular;  Laterality: N/A;   CORONARY ANGIOPLASTY WITH STENT PLACEMENT  07/2015   DIRECT LARYNGOSCOPY N/A 03/23/2023   Procedure: MICRODIRECT LARYNGOSCOPY WITH BIOPSY;  Surgeon: Christia Reading, MD;  Location: [facility];  Service: ENT;  Laterality: N/A;   LEFT HEART CATH AND CORONARY ANGIOGRAPHY N/A 02/19/2017   Procedure: LEFT HEART CATH  AND CORONARY ANGIOGRAPHY;  Surgeon: Swaziland, Peter M, MD;  Location: [facility] INVASIVE CV LAB;  Service: Cardiovascular;  Laterality: N/A;   LUMBAR FUSION  04/11/2019   REVISION OF THORACOLUMBAR FUSION    LUMBAR SPINE SURGERY  03/2009  & 2012   MASS EXCISION N/A 04/19/2012   Procedure: removal of posterior cervical lipoma;  Surgeon: Cristi Loron, MD;  Location: [facility];  Service: Neurosurgery;  Laterality: N/A;  Removal of posterior cervical lipoma   POPLITEAL SYNOVIAL CYST EXCISION Left    "opened up behind my knee; scraped out arthritis"   POSTERIOR LUMBAR FUSION 4 LEVEL N/A 11/22/2018   Procedure: Posterior Lateral and Interbody fusion - Lumbar one-Lumbar two; explore fusion, posterior  instrumentation and fusion Thoracic ten to the ilium;  Surgeon: Tressie Stalker, MD;  Location: [facility] OR;  Service: Neurosurgery;  Laterality: N/A;   SPINAL CORD STIMULATOR INSERTION N/A 01/07/2013   Procedure:  SPINAL CORD STIMULATOR INSERTION;  Surgeon: Gwynne Edinger, MD;  Location: [facility];  Service: Neurosurgery;  Laterality: N/A;   SPINAL CORD STIMULATOR INSERTION N/A 02/09/2019   Procedure: REPLACEMENT OF LUMBAR SPINAL CORD STIMULATOR;  Surgeon: Tressie Stalker, MD;  Location: [facility] OR;  Service: Neurosurgery;  Laterality: N/A;  Thoracic/Lumbar spine   TONSILLECTOMY     patient denies   TOTAL HIP ARTHROPLASTY Right 10/03/2016   Procedure: TOTAL HIP ARTHROPLASTY;  Surgeon: Frederico Hamman, MD;  Location: [facility] OR;  Service: Orthopedics;  Laterality: Right;   TOTAL KNEE ARTHROPLASTY Right 11/09/2020   Procedure: TOTAL KNEE ARTHROPLASTY;  Surgeon: Frederico Hamman, MD;  Location: [facility] ORS;  Service: Orthopedics;  Laterality: Right;   TOTAL KNEE REVISION Right 04/02/2022   Procedure: TOTAL KNEE REVISION;  Surgeon: Joen Laura, MD;  Location: [facility] OR;  Service: Orthopedics;  Laterality: Right;   WISDOM TOOTH EXTRACTION     hx of    Prior to Admission medications   Medication Sig Start Date End Date Taking? Authorizing Provider  ACCU-CHEK AVIVA PLUS test strip  04/30/19  Yes [provider]  Accu-Chek Softclix Lancets lancets 4 (four) times daily. 07/12/20  Yes [provider]  atorvastatin (LIPITOR) 10 MG tablet Take 1 tablet (10 mg total) by mouth at bedtime. 02/11/23  Yes   bisacodyl (DULCOLAX) 5 MG EC tablet Take by mouth as directed. 03/26/23  Yes   carvedilol (COREG) 12.5 MG tablet Take 1 tablet (12.5 mg total) by mouth 2 (two) times daily with a meal. 12/08/22  Yes Jake Bathe, MD  chlorthalidone (HYGROTON) 25 MG tablet Take 1 tablet (25 mg total) by mouth daily. **must keep dr appointment in Oct 2024* 12/08/22  Yes Jake Bathe, MD  dapagliflozin propanediol  (FARXIGA) 10 MG TABS tablet Take 1 tablet (10 mg total) by mouth daily. 05/21/23  Yes   diltiazem (DILT-XR) 180 MG 24 hr capsule Take 1 capsule (180 mg total) by mouth daily. 05/21/23  Yes   docusate sodium (COLACE) 100 MG capsule Take 1 capsule (100 mg total) by mouth 2 (two) times daily. Patient taking differently: Take 300 mg by mouth 2 (two) times daily. 02/10/19  Yes Val Eagle D, NP  DULoxetine (CYMBALTA) 60 MG capsule Take 1 capsule (60 mg total) by mouth daily. 05/21/23  Yes   glipiZIDE (GLUCOTROL XL) 10 MG 24 hr tablet Take 2 tablets (20 mg total) by mouth daily with breakfast. 11/12/22  Yes   glucose blood ([employer] BLOOD GLUCOSE TEST) test strip Use to test blood sugar 4 times a  day 01/28/23  Yes   glucose blood test strip Use as directed 4 times daily 05/21/23  Yes   linaclotide (LINZESS) 145 MCG CAPS capsule Take 1 capsule (145 mcg total) by mouth daily. 05/21/23  Yes   lisinopril (ZESTRIL) 20 MG tablet Take 1 tablet (20 mg total) by mouth daily. 02/11/23  Yes   lubiprostone (AMITIZA) 24 MCG capsule Take 1 capsule (24 mcg total) by mouth 2 (two) times daily. 02/11/23  Yes   metFORMIN (GLUCOPHAGE) 1000 MG tablet Take 1 tablet (1,000 mg total) by mouth 2 (two) times daily. 02/11/23  Yes   methocarbamol (ROBAXIN) 750 MG tablet Take 1 tablet (750 mg total) by mouth 2 (two) times daily as needed. 11/12/22  Yes   omeprazole (PRILOSEC) 40 MG capsule Take 1 capsule (40 mg total) by mouth daily. 12/22/22  Yes   omeprazole (PRILOSEC) 40 MG capsule Take 1 capsule (40 mg total) by mouth daily. 05/21/23  Yes   Oxycodone HCl 20 MG TABS Take 1 tablet (20 mg total) by mouth every 6 (six) hours. 03/17/23  Yes   PEG 3350-KCl-NaCl-NaSulf-MgSul (SUFLAVE) 178.7 g SOLR Take as directed. 03/30/23  Yes Jasin Brazel, Carie Caddy, MD  PEG 3350-KCl-NaCl-NaSulf-MgSul (SUFLAVE) 178.7 g SOLR Take 1 kit by mouth as directed. 03/30/23  Yes Desirae Mancusi, Carie Caddy, MD  Potassium Chloride ER 20 MEQ TBCR Take 1 tablet (20 mEq total) by mouth daily.  04/14/23  Yes   pregabalin (LYRICA) 150 MG capsule Take 1 capsule (150 mg total) by mouth 2 (two) times daily. 01/15/23  Yes   acetaminophen (TYLENOL) 500 MG tablet Take 1,000 mg by mouth 2 (two) times daily as needed (pain.).    [provider]  ascorbic acid (VITAMIN C) 500 MG tablet Take 500 mg by mouth daily.    [provider]  aspirin EC 81 MG tablet Take 81 mg by mouth daily. Swallow whole.    [provider]  atorvastatin (LIPITOR) 10 MG tablet Take 1 tablet (10 mg total) by mouth at bedtime. Patient not taking: Reported on 03/18/2023 06/03/22     atorvastatin (LIPITOR) 10 MG tablet Take 1 tablet (10 mg total) by mouth at bedtime. Patient not taking: Reported on 06/02/2023 05/21/23     bisacodyl 5 MG EC tablet Take 4 tablets by mouth as directed on your prep instructions @ 3 PM on 4/6. Patient not taking: Reported on 06/02/2023 04/17/23   Beverley Fiedler, MD  Buprenorphine HCl (BELBUCA) 900 MCG FILM Place 1 (one) Film inside cheeks two times daily Patient not taking: Reported on 03/18/2023 05/20/22     Buprenorphine HCl (BELBUCA) 900 MCG FILM Place 1 Film inside cheek 2 (two) times daily. 05/14/23     Continuous Blood Gluc Sensor (FREESTYLE LIBRE 2 SENSOR) MISC Use to test blood sugar, change every other week 10/03/21     diclofenac Sodium (VOLTAREN ARTHRITIS PAIN) 1 % GEL Apply small amount to affected area twice a day as needed Patient not taking: Reported on 03/18/2023 07/03/22     diclofenac Sodium (VOLTAREN) 1 % GEL Apply small amount to the affected area 3 times a day as needed for pain Patient not taking: Reported on 03/18/2023 12/16/22     diclofenac Sodium (VOLTAREN) 1 % GEL Apply a small amount to affected area 2 times a day as needed for pain 01/12/23     diltiazem (DILT-XR) 180 MG 24 hr capsule Take 1 capsule (180 mg total) by mouth daily. Patient not taking: Reported on 06/02/2023 12/08/22  Jake Bathe, MD  DULoxetine (CYMBALTA) 60 MG capsule Take 1 capsule (60 mg  total) by mouth daily. Patient not taking: Reported on 06/02/2023 02/11/23     glipiZIDE (GLUCOTROL XL) 10 MG 24 hr tablet Take 2 tablets (20 mg total) by mouth daily with breakfast. Patient not taking: Reported on 03/18/2023 02/11/23     glipiZIDE (GLUCOTROL XL) 10 MG 24 hr tablet Take 2 tablets (20 mg total) by mouth daily with breakfast. Patient not taking: Reported on 06/02/2023 05/21/23     glucose blood ([employer] BLOOD GLUCOSE TEST) test strip Use 1 Strip four times daily, accu-chek aviva plus Patient not taking: Reported on 06/02/2023 02/11/23     glucose blood test strip Use to test 4 times a day as directed Patient not taking: Reported on 06/02/2023 12/09/21     linaclotide (LINZESS) 145 MCG CAPS capsule Take 1 capsule (145 mcg total) by mouth daily. Patient not taking: Reported on 06/02/2023 11/12/22     linaclotide (LINZESS) 145 MCG CAPS capsule Take 145 mcg by mouth daily before breakfast. Patient not taking: Reported on 06/02/2023    [provider]  lisinopril (ZESTRIL) 20 MG tablet Take 1 tablet (20 mg total) by mouth daily. Patient not taking: Reported on 06/02/2023 05/21/23     lubiprostone (AMITIZA) 24 MCG capsule Take 2 capsules (48 mcg total) by mouth at bedtime. Patient not taking: Reported on 03/18/2023 08/19/21     lubiprostone (AMITIZA) 24 MCG capsule Take 2 capsules (48 mcg total) by mouth at bedtime. Patient not taking: Reported on 06/02/2023 05/21/23     metFORMIN (GLUCOPHAGE) 1000 MG tablet Take 1 tablet (1,000 mg total) by mouth 2 (two) times daily. Patient not taking: Reported on 03/18/2023 11/12/22     metFORMIN (GLUCOPHAGE) 1000 MG tablet Take 1 tablet (1,000 mg total) by mouth 2 (two) times daily. Patient not taking: Reported on 06/02/2023 05/21/23     methocarbamol (ROBAXIN) 500 MG tablet Take 1 tablet (500 mg total) by mouth every 6 (six) hours as needed for pain. Patient not taking: Reported on 03/18/2023 05/13/22     Multiple Vitamin (MULTIVITAMIN WITH MINERALS) TABS tablet  Take 1 tablet by mouth daily.    [provider]  mupirocin ointment (BACTROBAN) 2 % Apply to affected skin 2 times per day 05/21/23     naloxone (NARCAN) nasal spray 4 mg/0.1 mL Place 1 spray into the nose as needed. If found unresposive then spray this into nose and call 911 immediately 11/16/21     naloxone (NARCAN) nasal spray 4 mg/0.1 mL Use 1 (one)  spray as needed, IF FOUND UNRESPONSIVE THEN SPRAY THIS INTO NOSE AND CALL 911 IMMEDIATELY 04/16/23     omeprazole (PRILOSEC) 40 MG capsule Take 1 capsule (40 mg total) by mouth daily. Patient not taking: Reported on 03/18/2023 11/12/22     omeprazole (PRILOSEC) 40 MG capsule Take 1 capsule (40 mg total) by mouth daily. Patient not taking: Reported on 03/18/2023 12/10/22     oxyCODONE (OXY IR/ROXICODONE) 5 MG immediate release tablet Take 1 tablet (5 mg total) by mouth every 4 (four) hours as needed for pain. Patient not taking: Reported on 06/02/2023 06/24/22     Oxycodone HCl 20 MG TABS Take 1 tablet (20 mg total) by mouth every 4 (four) hours as needed for pain. Patient not taking: Reported on 03/18/2023 04/16/22     Oxycodone HCl 20 MG TABS Take 1 tablet (20 mg total) by mouth every 6 (six) hours as needed. Patient  not taking: Reported on 03/18/2023 12/16/22     Oxycodone HCl 20 MG TABS Take 1 tablet (20 mg total) by mouth every 6 (six) hours as needed. Patient not taking: Reported on 03/18/2023 02/16/23     Oxycodone HCl 20 MG TABS Take 1 tablet (20 mg total) by mouth every 6 (six) hours as needed. 05/14/23     polyethylene glycol powder (MIRALAX) 17 GM/SCOOP powder Take as directed 03/26/23     Potassium Chloride ER 20 MEQ TBCR Take 1 tablet (20 mEq total) by mouth 2 (two) times daily. Patient not taking: Reported on 03/18/2023 04/16/22     Potassium Chloride ER 20 MEQ TBCR Take 1 tablet (20 mEq total) by mouth daily. Patient not taking: Reported on 03/18/2023 12/10/22     Potassium Chloride ER 20 MEQ TBCR Take 1 tablet (20 mEq total) by mouth  daily. Patient not taking: Reported on 06/02/2023 05/21/23     pregabalin (LYRICA) 150 MG capsule Take 1 capsule by mouth 2 times daily Patient not taking: Reported on 03/18/2023 07/21/22     pregabalin (LYRICA) 150 MG capsule Take 1 capsule (150 mg total) by mouth 2 (two) times daily. Patient not taking: Reported on 06/02/2023 05/14/23     sildenafil (VIAGRA) 100 MG tablet Take 1 tablet (100 mg total) by mouth daily as needed. 05/07/21     sildenafil (VIAGRA) 100 MG tablet Take 1 tablet (100 mg total) by mouth daily. 05/21/23       Current Outpatient Medications  Medication Sig Dispense Refill   ACCU-CHEK AVIVA PLUS test strip      Accu-Chek Softclix Lancets lancets 4 (four) times daily.     atorvastatin (LIPITOR) 10 MG tablet Take 1 tablet (10 mg total) by mouth at bedtime. 90 tablet 1   bisacodyl (DULCOLAX) 5 MG EC tablet Take by mouth as directed. 4 tablet 0   carvedilol (COREG) 12.5 MG tablet Take 1 tablet (12.5 mg total) by mouth 2 (two) times daily with a meal. 180 tablet 3   chlorthalidone (HYGROTON) 25 MG tablet Take 1 tablet (25 mg total) by mouth daily. **must keep dr appointment in Oct 2024* 90 tablet 3   dapagliflozin propanediol (FARXIGA) 10 MG TABS tablet Take 1 tablet (10 mg total) by mouth daily. 90 tablet 0   diltiazem (DILT-XR) 180 MG 24 hr capsule Take 1 capsule (180 mg total) by mouth daily. 30 capsule 0   docusate sodium (COLACE) 100 MG capsule Take 1 capsule (100 mg total) by mouth 2 (two) times daily. (Patient taking differently: Take 300 mg by mouth 2 (two) times daily.) 10 capsule 0   DULoxetine (CYMBALTA) 60 MG capsule Take 1 capsule (60 mg total) by mouth daily. 90 capsule 1   glipiZIDE (GLUCOTROL XL) 10 MG 24 hr tablet Take 2 tablets (20 mg total) by mouth daily with breakfast. 90 tablet 1   glucose blood ([employer] BLOOD GLUCOSE TEST) test strip Use to test blood sugar 4 times a day 120 strip 12   glucose blood test strip Use as directed 4 times daily 120 each 12    linaclotide (LINZESS) 145 MCG CAPS capsule Take 1 capsule (145 mcg total) by mouth daily. 90 capsule 3   lisinopril (ZESTRIL) 20 MG tablet Take 1 tablet (20 mg total) by mouth daily. 90 tablet 1   lubiprostone (AMITIZA) 24 MCG capsule Take 1 capsule (24 mcg total) by mouth 2 (two) times daily. 180 capsule 0   metFORMIN (GLUCOPHAGE) 1000 MG tablet Take 1  tablet (1,000 mg total) by mouth 2 (two) times daily. 180 tablet 1   methocarbamol (ROBAXIN) 750 MG tablet Take 1 tablet (750 mg total) by mouth 2 (two) times daily as needed. 60 tablet 4   omeprazole (PRILOSEC) 40 MG capsule Take 1 capsule (40 mg total) by mouth daily. 30 capsule 5   omeprazole (PRILOSEC) 40 MG capsule Take 1 capsule (40 mg total) by mouth daily. 90 capsule 1   Oxycodone HCl 20 MG TABS Take 1 tablet (20 mg total) by mouth every 6 (six) hours. 120 tablet 0   PEG 3350-KCl-NaCl-NaSulf-MgSul (SUFLAVE) 178.7 g SOLR Take as directed. 2 each 0   PEG 3350-KCl-NaCl-NaSulf-MgSul (SUFLAVE) 178.7 g SOLR Take 1 kit by mouth as directed. 2 each 0   Potassium Chloride ER 20 MEQ TBCR Take 1 tablet (20 mEq total) by mouth daily. 90 tablet 0   pregabalin (LYRICA) 150 MG capsule Take 1 capsule (150 mg total) by mouth 2 (two) times daily. 60 capsule 0   acetaminophen (TYLENOL) 500 MG tablet Take 1,000 mg by mouth 2 (two) times daily as needed (pain.).     ascorbic acid (VITAMIN C) 500 MG tablet Take 500 mg by mouth daily.     aspirin EC 81 MG tablet Take 81 mg by mouth daily. Swallow whole.     atorvastatin (LIPITOR) 10 MG tablet Take 1 tablet (10 mg total) by mouth at bedtime. (Patient not taking: Reported on 03/18/2023) 90 tablet 1   atorvastatin (LIPITOR) 10 MG tablet Take 1 tablet (10 mg total) by mouth at bedtime. (Patient not taking: Reported on 06/02/2023) 90 tablet 1   bisacodyl 5 MG EC tablet Take 4 tablets by mouth as directed on your prep instructions @ 3 PM on 4/6. (Patient not taking: Reported on 06/02/2023) 4 tablet 0   Buprenorphine HCl  (BELBUCA) 900 MCG FILM Place 1 (one) Film inside cheeks two times daily (Patient not taking: Reported on 03/18/2023) 60 each 0   Buprenorphine HCl (BELBUCA) 900 MCG FILM Place 1 Film inside cheek 2 (two) times daily. 60 each 0   Continuous Blood Gluc Sensor (FREESTYLE LIBRE 2 SENSOR) MISC Use to test blood sugar, change every other week 6 each 4   diclofenac Sodium (VOLTAREN ARTHRITIS PAIN) 1 % GEL Apply small amount to affected area twice a day as needed (Patient not taking: Reported on 03/18/2023) 150 g 2   diclofenac Sodium (VOLTAREN) 1 % GEL Apply small amount to the affected area 3 times a day as needed for pain (Patient not taking: Reported on 03/18/2023) 100 g 2   diclofenac Sodium (VOLTAREN) 1 % GEL Apply a small amount to affected area 2 times a day as needed for pain 100 g 5   diltiazem (DILT-XR) 180 MG 24 hr capsule Take 1 capsule (180 mg total) by mouth daily. (Patient not taking: Reported on 06/02/2023) 90 capsule 3   DULoxetine (CYMBALTA) 60 MG capsule Take 1 capsule (60 mg total) by mouth daily. (Patient not taking: Reported on 06/02/2023) 90 capsule 1   glipiZIDE (GLUCOTROL XL) 10 MG 24 hr tablet Take 2 tablets (20 mg total) by mouth daily with breakfast. (Patient not taking: Reported on 03/18/2023) 90 tablet 1   glipiZIDE (GLUCOTROL XL) 10 MG 24 hr tablet Take 2 tablets (20 mg total) by mouth daily with breakfast. (Patient not taking: Reported on 06/02/2023) 90 tablet 1   glucose blood ([employer] BLOOD GLUCOSE TEST) test strip Use 1 Strip four times daily, accu-chek aviva plus (Patient  not taking: Reported on 06/02/2023) 120 strip 12   glucose blood test strip Use to test 4 times a day as directed (Patient not taking: Reported on 06/02/2023) 150 each 11   linaclotide (LINZESS) 145 MCG CAPS capsule Take 1 capsule (145 mcg total) by mouth daily. (Patient not taking: Reported on 06/02/2023) 90 capsule 3   linaclotide (LINZESS) 145 MCG CAPS capsule Take 145 mcg by mouth daily before breakfast. (Patient not  taking: Reported on 06/02/2023)     lisinopril (ZESTRIL) 20 MG tablet Take 1 tablet (20 mg total) by mouth daily. (Patient not taking: Reported on 06/02/2023) 90 tablet 1   lubiprostone (AMITIZA) 24 MCG capsule Take 2 capsules (48 mcg total) by mouth at bedtime. (Patient not taking: Reported on 03/18/2023) 180 capsule 0   lubiprostone (AMITIZA) 24 MCG capsule Take 2 capsules (48 mcg total) by mouth at bedtime. (Patient not taking: Reported on 06/02/2023) 180 capsule 1   metFORMIN (GLUCOPHAGE) 1000 MG tablet Take 1 tablet (1,000 mg total) by mouth 2 (two) times daily. (Patient not taking: Reported on 03/18/2023) 180 tablet 1   metFORMIN (GLUCOPHAGE) 1000 MG tablet Take 1 tablet (1,000 mg total) by mouth 2 (two) times daily. (Patient not taking: Reported on 06/02/2023) 180 tablet 1   methocarbamol (ROBAXIN) 500 MG tablet Take 1 tablet (500 mg total) by mouth every 6 (six) hours as needed for pain. (Patient not taking: Reported on 03/18/2023) 30 tablet 0   Multiple Vitamin (MULTIVITAMIN WITH MINERALS) TABS tablet Take 1 tablet by mouth daily.     mupirocin ointment (BACTROBAN) 2 % Apply to affected skin 2 times per day 22 g 0   naloxone (NARCAN) nasal spray 4 mg/0.1 mL Place 1 spray into the nose as needed. If found unresposive then spray this into nose and call 911 immediately 2 each 3   naloxone (NARCAN) nasal spray 4 mg/0.1 mL Use 1 (one)  spray as needed, IF FOUND UNRESPONSIVE THEN SPRAY THIS INTO NOSE AND CALL 911 IMMEDIATELY 2 each 3   omeprazole (PRILOSEC) 40 MG capsule Take 1 capsule (40 mg total) by mouth daily. (Patient not taking: Reported on 03/18/2023) 90 capsule 0   omeprazole (PRILOSEC) 40 MG capsule Take 1 capsule (40 mg total) by mouth daily. (Patient not taking: Reported on 03/18/2023) 90 capsule 0   oxyCODONE (OXY IR/ROXICODONE) 5 MG immediate release tablet Take 1 tablet (5 mg total) by mouth every 4 (four) hours as needed for pain. (Patient not taking: Reported on 06/02/2023) 30 tablet 0   Oxycodone  HCl 20 MG TABS Take 1 tablet (20 mg total) by mouth every 4 (four) hours as needed for pain. (Patient not taking: Reported on 03/18/2023) 40 tablet 0   Oxycodone HCl 20 MG TABS Take 1 tablet (20 mg total) by mouth every 6 (six) hours as needed. (Patient not taking: Reported on 03/18/2023) 120 tablet 0   Oxycodone HCl 20 MG TABS Take 1 tablet (20 mg total) by mouth every 6 (six) hours as needed. (Patient not taking: Reported on 03/18/2023) 120 tablet 0   Oxycodone HCl 20 MG TABS Take 1 tablet (20 mg total) by mouth every 6 (six) hours as needed. 120 tablet 0   polyethylene glycol powder (MIRALAX) 17 GM/SCOOP powder Take as directed 238 g 0   Potassium Chloride ER 20 MEQ TBCR Take 1 tablet (20 mEq total) by mouth 2 (two) times daily. (Patient not taking: Reported on 03/18/2023) 14 tablet 0   Potassium Chloride ER 20 MEQ TBCR  Take 1 tablet (20 mEq total) by mouth daily. (Patient not taking: Reported on 03/18/2023) 90 tablet 0   Potassium Chloride ER 20 MEQ TBCR Take 1 tablet (20 mEq total) by mouth daily. (Patient not taking: Reported on 06/02/2023) 90 tablet 0   pregabalin (LYRICA) 150 MG capsule Take 1 capsule by mouth 2 times daily (Patient not taking: Reported on 03/18/2023) 60 capsule 0   pregabalin (LYRICA) 150 MG capsule Take 1 capsule (150 mg total) by mouth 2 (two) times daily. (Patient not taking: Reported on 06/02/2023) 60 capsule 0   sildenafil (VIAGRA) 100 MG tablet Take 1 tablet (100 mg total) by mouth daily as needed. 10 tablet 0   sildenafil (VIAGRA) 100 MG tablet Take 1 tablet (100 mg total) by mouth daily. 30 tablet 0   Current Facility-Administered Medications  Medication Dose Route Frequency Provider Last Rate Last Admin   0.9 %  sodium chloride infusion  500 mL Intravenous Once Geanie Pacifico, Carie Caddy, MD        Allergies as of 06/02/2023 - Review Complete 06/02/2023  Allergen Reaction Noted   Aquacel extra hydrofiber [wound dressings] Other (See Comments) 03/16/2022   Brilinta [ticagrelor]  Shortness Of Breath and Other (See Comments) 01/04/2016   Methylprednisolone Other (See Comments) 12/03/2015   Prednisone Other (See Comments) 12/03/2015   Toradol [ketorolac tromethamine] Other (See Comments) 01/04/2013   Adhesive [tape] Rash and Other (See Comments) 10/07/2010    Family History  Problem Relation Age of Onset   Heart disease Mother    Colon cancer Brother    Prostate cancer Brother    Rectal cancer Neg Hx    Esophageal cancer Neg Hx     Social History   Socioeconomic History   Marital status: Married    Spouse name: Not on file   Number of children: 5   Years of education: Not on file   Highest education level: Not on file  Occupational History   Occupation: Psychologist, forensic, now on disability due to back pain  Tobacco Use   Smoking status: Former    Current packs/day: 0.00    Average packs/day: 0.3 packs/day for 40.0 years (10.0 ttl pk-yrs)    Types: Cigarettes    Start date: 1968    Quit date: 2008    Years since quitting: 17.2   Smokeless tobacco: Never   Tobacco comments:    02/18/2017 "quit in 2012"  Vaping Use   Vaping status: Never Used  Substance and Sexual Activity   Alcohol use: No    Alcohol/week: 0.0 standard drinks of alcohol   Drug use: Yes    Comment: chronic opioids for chronic back pain   Sexual activity: Not Currently  Other Topics Concern   Not on file  Social History Narrative   Not on file   Social Drivers of Health   Financial Resource Strain: Not on File (06/13/2021)   Received from [facility], [employer]    Financial Resource Strain: 0  Food Insecurity: Low Risk  (12/12/2022)   Received from [facility]   Hunger Vital Sign    Worried About Running Out of Food in the Last Year: Never true    Ran Out of Food in the Last Year: Never true  Transportation Needs: No Transportation Needs (12/12/2022)   Received from [facility]    In the past 12 months, has lack of reliable  transportation kept you from medical appointments, meetings, work or from getting  things needed for daily living? : No  Physical Activity: Not on File (06/13/2021)   Received from [place], [place]   Physical Activity    Physical Activity: 0  Stress: Not on File (06/13/2021)   Received from [facility], [place]   Stress    Stress: 0  Social Connections: Not on File (11/08/2022)   Received from [facility]   Social Connections    Connectedness: 0  Intimate Partner Violence: Not At Risk (04/02/2022)   Humiliation, Afraid, Rape, and Kick questionnaire    Fear of Current or Ex-Partner: No    Emotionally Abused: No    Physically Abused: No    Sexually Abused: No    Physical Exam: Vital signs in last 24 hours: @BP  122/82   Pulse 82   Temp 98.3 F (36.8 C) (Temporal)   Ht 5\' 9"  (1.753 m)   Wt 244 lb (110.7 kg)   SpO2 96%   BMI 36.03 kg/m  GEN: NAD EYE: Sclerae anicteric ENT: MMM CV: Non-tachycardic Pulm: CTA b/l GI: Soft, NT/ND NEURO:  Alert & Oriented x 3   Erick Blinks, MD [facility]  06/02/2023 9:40 AM

## 2023-06-02 NOTE — Progress Notes (Signed)
 To pacu, VSS. Report to [email or web address].tb

## 2023-06-02 NOTE — Op Note (Signed)
 [facility] Patient Name: Thomas Mcclure Procedure Date: 06/02/2023 9:34 AM MRN: [id number] Endoscopist: Beverley Fiedler , MD, [id number] Age: 68 Referring MD:  Date of Birth: [birth year] Gender: Male Account #: [id number] Procedure:                Colonoscopy Indications:              Screening for colorectal malignant neoplasm, Last                            colonoscopy: 2013 Medicines:                Monitored Anesthesia Care Procedure:                Pre-Anesthesia Assessment:                           - Prior to the procedure, a History and Physical                            was performed, and patient medications and                            allergies were reviewed. The patient's tolerance of                            previous anesthesia was also reviewed. The risks                            and benefits of the procedure and the sedation                            options and risks were discussed with the patient.                            All questions were answered, and informed consent                            was obtained. Prior Anticoagulants: The patient has                            taken no anticoagulant or antiplatelet agents. ASA                            Grade Assessment: II - A patient with mild systemic                            disease. After reviewing the risks and benefits,                            the patient was deemed in satisfactory condition to                            undergo the procedure.  After obtaining informed consent, the colonoscope                            was passed under direct vision. Throughout the                            procedure, the patient's blood pressure, pulse, and                            oxygen saturations were monitored continuously. The                            CF [id number] #[id number] was introduced through the anus                            and advanced to the cecum, identified by                             appendiceal orifice and ileocecal valve. The                            colonoscopy was performed without difficulty. The                            patient tolerated the procedure well. The quality                            of the bowel preparation was good. The ileocecal                            valve, appendiceal orifice, and rectum were                            photographed. Scope In: 9:55:51 AM Scope Out: 10:26:01 AM Scope Withdrawal Time: 0 hours 13 minutes 46 seconds  Total Procedure Duration: 0 hours 30 minutes 10 seconds  Findings:                 The digital rectal exam was normal.                           The sigmoid colon and descending colon were                            moderately redundant.                           The entire examined colon appeared normal on direct                            and retroflexion views. Complications:            No immediate complications. Estimated Blood Loss:     Estimated blood loss: none. Impression:               - The entire examined colon is normal on direct  and                            retroflexion views.                           - No specimens collected. Recommendation:           - Patient has a contact number available for                            emergencies. The signs and symptoms of potential                            delayed complications were discussed with the                            patient. Return to normal activities tomorrow.                            Written discharge instructions were provided to the                            patient.                           - Resume previous diet.                           - Continue present medications.                           - Repeat colonoscopy in 10 years for screening                            purposes. Beverley Fiedler, MD 06/02/2023 10:30:50 AM This report has been signed electronically.

## 2023-06-02 NOTE — Patient Instructions (Signed)
Resume previous diet Continue present medications There were no colon polyps seen today!   You will need another screening colonoscopy in 10 years, you will receive a letter at that time when you are due for the procedure.  Please call us at [phone] if you have a change in bowel habits, change in family history of colo-rectal cancer, rectal bleeding or other GI concern before that time.  YOU HAD AN ENDOSCOPIC PROCEDURE TODAY AT THE  ENDOSCOPY CENTER:   Refer to the procedure report that was given to you for any specific questions about what was found during the examination.  If the procedure report does not answer your questions, please call your gastroenterologist to clarify.  If you requested that your care partner not be given the details of your procedure findings, then the procedure report has been included in a sealed envelope for you to review at your convenience later.  YOU SHOULD EXPECT: Some feelings of bloating in the abdomen. Passage of more gas than usual.  Walking can help get rid of the air that was put into your GI tract during the procedure and reduce the bloating. If you had a lower endoscopy (such as a colonoscopy or flexible sigmoidoscopy) you may notice spotting of blood in your stool or on the toilet paper. If you underwent a bowel prep for your procedure, you may not have a normal bowel movement for a few days.  Please Note:  You might notice some irritation and congestion in your nose or some drainage.  This is from the oxygen used during your procedure.  There is no need for concern and it should clear up in a day or so.  SYMPTOMS TO REPORT IMMEDIATELY:  Following lower endoscopy (colonoscopy):  Excessive amounts of blood in the stool  Significant tenderness or worsening of abdominal pains  Swelling of the abdomen that is new, acute  Fever of 100F or higher  For urgent or emergent issues, a gastroenterologist can be reached at any hour by calling [phone]  [phone]. Do not use MyChart messaging for urgent concerns.   DIET:  We do recommend a small meal at first, but then you may proceed to your regular diet.  Drink plenty of fluids but you should avoid alcoholic beverages for 24 hours.  ACTIVITY:  You should plan to take it easy for the rest of today and you should NOT DRIVE or use heavy machinery until tomorrow (because of the sedation medicines used during the test).    FOLLOW UP: Our staff will call the number listed on your records the next business day following your procedure.  We will call around 7:15- 8:00 am to check on you and address any questions or concerns that you may have regarding the information given to you following your procedure. If we do not reach you, we will leave a message.     SIGNATURES/CONFIDENTIALITY: You and/or your care partner have signed paperwork which will be entered into your electronic medical record.  These signatures attest to the fact that that the information above on your After Visit Summary has been reviewed and is understood.  Full responsibility of the confidentiality of this discharge information lies with you and/or your care-partner.

## 2023-06-02 NOTE — Progress Notes (Signed)
 Pt's states no medical or surgical changes since previsit or office visit.

## 2023-06-03 ENCOUNTER — Telehealth: Payer: Self-pay

## 2023-06-03 NOTE — Telephone Encounter (Signed)
 Left message on follow up call.

## 2023-06-08 ENCOUNTER — Other Ambulatory Visit (HOSPITAL_COMMUNITY): Payer: Self-pay

## 2023-06-12 ENCOUNTER — Other Ambulatory Visit: Payer: Self-pay

## 2023-06-12 ENCOUNTER — Other Ambulatory Visit (HOSPITAL_COMMUNITY): Payer: Self-pay

## 2023-06-15 ENCOUNTER — Other Ambulatory Visit (HOSPITAL_COMMUNITY): Payer: Self-pay

## 2023-06-16 ENCOUNTER — Other Ambulatory Visit (HOSPITAL_COMMUNITY): Payer: Self-pay

## 2023-06-16 ENCOUNTER — Other Ambulatory Visit: Payer: Self-pay

## 2023-06-16 MED ORDER — OXYCODONE HCL 20 MG PO TABS
20.0000 mg | ORAL_TABLET | Freq: Four times a day (QID) | ORAL | 0 refills | Status: DC | PRN
  Filled 2023-06-24: qty 120, 30d supply, fill #0

## 2023-06-16 MED ORDER — BELBUCA 900 MCG BU FILM
900.0000 ug | ORAL_FILM | Freq: Two times a day (BID) | BUCCAL | 0 refills | Status: DC
Start: 2023-06-15 — End: 2023-07-16
  Filled 2023-06-16: qty 60, 30d supply, fill #0

## 2023-06-16 MED ORDER — PREGABALIN 150 MG PO CAPS
150.0000 mg | ORAL_CAPSULE | Freq: Two times a day (BID) | ORAL | 0 refills | Status: DC
  Filled 2023-06-17: qty 60, 30d supply, fill #0

## 2023-06-17 ENCOUNTER — Other Ambulatory Visit (HOSPITAL_COMMUNITY): Payer: Self-pay

## 2023-06-24 ENCOUNTER — Other Ambulatory Visit (HOSPITAL_COMMUNITY): Payer: Self-pay

## 2023-07-02 ENCOUNTER — Institutional Professional Consult (permissible substitution) (INDEPENDENT_AMBULATORY_CARE_PROVIDER_SITE_OTHER): Admitting: Otolaryngology

## 2023-07-02 ENCOUNTER — Telehealth (INDEPENDENT_AMBULATORY_CARE_PROVIDER_SITE_OTHER): Payer: Self-pay

## 2023-07-02 NOTE — Telephone Encounter (Signed)
 Patient came into office today for an appointment with Dr. Lydia Sams. Per provider, patient should be seen at [facility]. Patient informed. Patient not seen today by provider.

## 2023-07-04 ENCOUNTER — Other Ambulatory Visit (HOSPITAL_COMMUNITY): Payer: Self-pay

## 2023-07-05 ENCOUNTER — Other Ambulatory Visit (HOSPITAL_COMMUNITY): Payer: Self-pay

## 2023-07-05 ENCOUNTER — Other Ambulatory Visit: Payer: Self-pay

## 2023-07-06 ENCOUNTER — Other Ambulatory Visit (HOSPITAL_COMMUNITY): Payer: Self-pay

## 2023-07-07 ENCOUNTER — Other Ambulatory Visit: Payer: Self-pay | Admitting: Otolaryngology

## 2023-07-07 ENCOUNTER — Other Ambulatory Visit (HOSPITAL_COMMUNITY): Payer: Self-pay

## 2023-07-08 ENCOUNTER — Other Ambulatory Visit: Payer: Self-pay

## 2023-07-08 ENCOUNTER — Other Ambulatory Visit (HOSPITAL_COMMUNITY): Payer: Self-pay

## 2023-07-09 ENCOUNTER — Other Ambulatory Visit (HOSPITAL_COMMUNITY): Payer: Self-pay

## 2023-07-13 ENCOUNTER — Other Ambulatory Visit (HOSPITAL_COMMUNITY): Payer: Self-pay

## 2023-07-16 ENCOUNTER — Other Ambulatory Visit: Payer: Self-pay

## 2023-07-16 ENCOUNTER — Other Ambulatory Visit (HOSPITAL_COMMUNITY): Payer: Self-pay

## 2023-07-16 MED ORDER — OXYCODONE HCL 20 MG PO TABS
20.0000 mg | ORAL_TABLET | Freq: Four times a day (QID) | ORAL | 0 refills | Status: DC | PRN
  Filled 2023-07-16 – 2023-07-24 (×3): qty 120, 30d supply, fill #0

## 2023-07-16 MED ORDER — BELBUCA 900 MCG BU FILM
900.0000 ug | ORAL_FILM | Freq: Two times a day (BID) | BUCCAL | 0 refills | Status: DC
  Filled 2023-07-16: qty 60, 30d supply, fill #0

## 2023-07-16 MED ORDER — PREGABALIN 150 MG PO CAPS
150.0000 mg | ORAL_CAPSULE | Freq: Two times a day (BID) | ORAL | 0 refills | Status: DC
Start: 2023-07-15 — End: 2023-08-14
  Filled 2023-07-16 – 2023-07-24 (×2): qty 60, 30d supply, fill #0

## 2023-07-17 ENCOUNTER — Other Ambulatory Visit (HOSPITAL_COMMUNITY): Payer: Self-pay

## 2023-07-17 MED ORDER — SILDENAFIL CITRATE 100 MG PO TABS
100.0000 mg | ORAL_TABLET | Freq: Once | ORAL | 0 refills | Status: DC | PRN
  Filled 2023-07-17: qty 10, 10d supply, fill #0

## 2023-07-21 ENCOUNTER — Other Ambulatory Visit (HOSPITAL_COMMUNITY): Payer: Self-pay

## 2023-07-21 MED ORDER — SILDENAFIL CITRATE 100 MG PO TABS
100.0000 mg | ORAL_TABLET | Freq: Every day | ORAL | 0 refills | Status: AC | PRN
  Filled 2023-07-21: qty 30, 30d supply, fill #0

## 2023-07-21 NOTE — Progress Notes (Signed)
 Patient had same surgery on 03-23-23 and had telephone clearance with cardiology. Chart reviewed with Dr Claudio Culver and he wants patient to have clearance for this upcoming laryngoscopy. Latosha at Dr Tellis Feathers office left a message requesting cards clearance,

## 2023-07-22 ENCOUNTER — Other Ambulatory Visit (HOSPITAL_COMMUNITY): Payer: Self-pay

## 2023-07-22 ENCOUNTER — Telehealth: Payer: Self-pay

## 2023-07-22 NOTE — Telephone Encounter (Signed)
 S/W spouse and got TELE Preop Appt scheduled for 07/24/23. Med Rec and consent done.

## 2023-07-22 NOTE — Telephone Encounter (Signed)
   Pre-operative Risk Assessment    Patient Name: Thomas Mcclure  DOB: [birth year] MRN: [id number]   Date of last office visit: 12/08/22 Dorothye Gathers, MD Date of next office visit: NONE   Request for Surgical Clearance    Procedure:  DIRECT LARYNGOSCOPY WITH BIOPSY  Date of Surgery:  Clearance 07/28/23                                Surgeon:  DR Tellis Feathers Surgeon's Group or Practice Name:  [facility] Phone number:  [phone] Fax number:  [phone]   Type of Clearance Requested:   - Medical  - Pharmacy:  Hold Aspirin      Type of Anesthesia:  Not Indicated   Additional requests/questions:    SignedCollin Deal   07/22/2023, 4:49 PM

## 2023-07-22 NOTE — Telephone Encounter (Signed)
   Name: Thomas Mcclure  DOB: [birth year]  MRN: [id number]  Primary Cardiologist: Dorothye Gathers, MD   Preoperative team, please contact this patient and set up a phone call appointment for further preoperative risk assessment. Please obtain consent and complete medication review. Thank you for your help.  I confirm that guidance regarding antiplatelet and oral anticoagulation therapy has been completed and, if necessary, noted below.  Regarding ASA therapy, we recommend continuation of ASA throughout the perioperative period.  However, if the surgeon feels that cessation of ASA is required in the perioperative period, it may be stopped 5-7 days prior to surgery with a plan to resume it as soon as felt to be feasible from a surgical standpoint in the post-operative period (patient was previously cleared to hold aspirin  per MD).  I also confirmed the patient resides in the state of [place] . As per [facility] Medical Board telemedicine laws, the patient must reside in the state in which the provider is licensed.   Jude Norton, NP 07/22/2023, 4:56 PM [facility]

## 2023-07-22 NOTE — Telephone Encounter (Signed)
 Med Rec and consent done    Patient Consent for Virtual Visit        Thomas Mcclure has provided verbal consent on 07/22/2023 for a virtual visit (video or telephone).   CONSENT FOR VIRTUAL VISIT FOR:  Thomas Mcclure  By participating in this virtual visit I agree to the following:  I hereby voluntarily request, consent and authorize [facility] and its employed or contracted physicians, physician assistants, nurse practitioners or other licensed health care professionals (the Practitioner), to provide me with telemedicine health care services (the "Services") as deemed necessary by the treating Practitioner. I acknowledge and consent to receive the Services by the Practitioner via telemedicine. I understand that the telemedicine visit will involve communicating with the Practitioner through live audiovisual communication technology and the disclosure of certain medical information by electronic transmission. I acknowledge that I have been given the opportunity to request an in-person assessment or other available alternative prior to the telemedicine visit and am voluntarily participating in the telemedicine visit.  I understand that I have the right to withhold or withdraw my consent to the use of telemedicine in the course of my care at any time, without affecting my right to future care or treatment, and that the Practitioner or I may terminate the telemedicine visit at any time. I understand that I have the right to inspect all information obtained and/or recorded in the course of the telemedicine visit and may receive copies of available information for a reasonable fee.  I understand that some of the potential risks of receiving the Services via telemedicine include:  Delay or interruption in medical evaluation due to technological equipment failure or disruption; Information transmitted may not be sufficient (e.g. poor resolution of images) to allow for appropriate medical  decision making by the Practitioner; and/or  In rare instances, security protocols could fail, causing a breach of personal health information.  Furthermore, I acknowledge that it is my responsibility to provide information about my medical history, conditions and care that is complete and accurate to the best of my ability. I acknowledge that Practitioner's advice, recommendations, and/or decision may be based on factors not within their control, such as incomplete or inaccurate data provided by me or distortions of diagnostic images or specimens that may result from electronic transmissions. I understand that the practice of medicine is not an exact science and that Practitioner makes no warranties or guarantees regarding treatment outcomes. I acknowledge that a copy of this consent can be made available to me via my patient portal [facility] MyChart), or I can request a printed copy by calling the office of [facility].    I understand that my insurance will be billed for this visit.   I have read or had this consent read to me. I understand the contents of this consent, which adequately explains the benefits and risks of the Services being provided via telemedicine.  I have been provided ample opportunity to ask questions regarding this consent and the Services and have had my questions answered to my satisfaction. I give my informed consent for the services to be provided through the use of telemedicine in my medical care

## 2023-07-23 ENCOUNTER — Encounter (HOSPITAL_BASED_OUTPATIENT_CLINIC_OR_DEPARTMENT_OTHER): Payer: Self-pay | Admitting: Otolaryngology

## 2023-07-23 ENCOUNTER — Other Ambulatory Visit (HOSPITAL_COMMUNITY): Payer: Self-pay

## 2023-07-24 ENCOUNTER — Other Ambulatory Visit (HOSPITAL_COMMUNITY): Payer: Self-pay

## 2023-07-24 ENCOUNTER — Ambulatory Visit: Attending: Cardiology | Admitting: Nurse Practitioner

## 2023-07-24 DIAGNOSIS — Z01810 Encounter for preprocedural cardiovascular examination: Secondary | ICD-10-CM | POA: Diagnosis not present

## 2023-07-24 NOTE — Progress Notes (Signed)
 Virtual Visit via Telephone Note   Because of DYRELL TUCCILLO co-morbid illnesses, he is at least at moderate risk for complications without adequate follow up.  This format is felt to be most appropriate for this patient at this time.  Due to technical limitations with video connection (technology), today's appointment will be conducted as an audio only telehealth visit, and Thomas Mcclure verbally agreed to proceed in this manner.   All issues noted in this document were discussed and addressed.  No physical exam could be performed with this format.  Evaluation Performed:  Preoperative cardiovascular risk assessment _____________   Date:  07/24/2023   Patient ID:  Thomas Mcclure, Thomas Mcclure 01-27-1956, MRN [id number] Patient Location:  Home Provider location:   Office  Primary Care Provider:  Cherylin Corrigan, MD Primary Cardiologist:  Dorothye Gathers, MD  Chief Complaint / Patient Profile   68 y.o. y/o male with a h/o CAD s/p DES-RCA, LV aneurysm, ICM, left iliac artery aneurysm, hypertension, hyperlipidemia, type 2 diabetes, and chronic back pain who is pending  DIRECT LARYNGOSCOPY WITH BIOPSY on 07/28/2023 with Dr. Tellis Feathers of [facility] and presents today for telephonic preoperative cardiovascular risk assessment.  History of Present Illness    Thomas Mcclure is a 68 y.o. male who presents via audio/video conferencing for a telehealth visit today.  Pt was last seen in cardiology clinic on 12/08/2022 by Dr. Renna Cary. At that time [name] was doing well.  The patient is now pending procedure as outlined above. Since his last visit, he has done well from a cardiac standpoint.   He denies chest pain, palpitations, dyspnea, pnd, orthopnea, n, v, dizziness, syncope, edema, weight gain, or early satiety. All other systems reviewed and are otherwise negative except as noted above.   Past Medical History    Past Medical History:  Diagnosis Date   Baker's cyst    Left calf   CAD in native  artery, 07/15/15 PCI of RCA with DES 07/16/2015   a.   NSTEMI 5/17: LHC - pLAD 20, pLCx 20, OM1 30, pRCA 100, EF 45-50%>> PCI: 2.5 x 24 mm Promus DES to RCA  //  b.   Echo 5/17: mild LVH, EF 50-55%, no RWMA, mod RVE //  c. 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 Wright Heal [phone]   Iliac artery aneurysm (HCC)    1.5 cm left IIA 09/15/21 CTA; follow-up in ~ 18 months per vascular surgeon   Ischemic cardiomyopathy    a. LV-gram at time of LHC in 6/17 with EF 35-45%  //  b. Echo 7/17: EF 45-50%, inferior HK, grade 1 diastolic dysfunction, mildly dilated aortic root, moderately reduced RVSF, mild RAE   Myocardial infarction (HCC) 2017   Neuromuscular disorder (HCC)    "with nerve damage"   Numbness and tingling of both lower extremities    "on the outside of both sides" (02/18/2017)   Peripheral arterial disease (HCC)    Rheumatoid arthritis (HCC)    RA  Tachycardia    Type II diabetes mellitus (HCC)    diet controlled   Past Surgical History:  Procedure Laterality Date   BACK SURGERY     x3   bilateral cataract curgery      CARDIAC CATHETERIZATION N/A 07/15/2015   Procedure: Left Heart Cath and Coronary Angiography;  Surgeon: Peter M Swaziland, MD;  Location: [facility] INVASIVE CV LAB;  Service: Cardiovascular;  Laterality: N/A;   CARDIAC CATHETERIZATION N/A 07/15/2015   Procedure: Coronary Stent Intervention;  Surgeon: Peter M Swaziland, MD;  Location: [facility] INVASIVE CV LAB;  Service: Cardiovascular;  Laterality: N/A;   CARDIAC CATHETERIZATION N/A 08/07/2015   Procedure: Left Heart Cath and Coronary Angiography;   Surgeon: Arty Binning, MD;  Location: [facility] INVASIVE CV LAB;  Service: Cardiovascular;  Laterality: N/A;   CORONARY ANGIOPLASTY WITH STENT PLACEMENT  07/2015   DIRECT LARYNGOSCOPY N/A 03/23/2023   Procedure: MICRODIRECT LARYNGOSCOPY WITH BIOPSY;  Surgeon: Virgina Grills, MD;  Location: [facility];  Service: ENT;  Laterality: N/A;   LEFT HEART CATH AND CORONARY ANGIOGRAPHY N/A 02/19/2017   Procedure: LEFT HEART CATH AND CORONARY ANGIOGRAPHY;  Surgeon: Swaziland, Peter M, MD;  Location: [facility] INVASIVE CV LAB;  Service: Cardiovascular;  Laterality: N/A;   LUMBAR FUSION  04/11/2019   REVISION OF THORACOLUMBAR FUSION    LUMBAR SPINE SURGERY  03/2009  & 2012   MASS EXCISION N/A 04/19/2012   Procedure: removal of posterior cervical lipoma;  Surgeon: Elder Greening, MD;  Location: [facility]URO ORS;  Service: Neurosurgery;  Laterality: N/A;  Removal of posterior cervical lipoma   POPLITEAL SYNOVIAL CYST EXCISION Left    "opened up behind my knee; scraped out arthritis"   POSTERIOR LUMBAR FUSION 4 LEVEL N/A 11/22/2018   Procedure: Posterior Lateral and Interbody fusion - Lumbar one-Lumbar two; explore fusion, posterior instrumentation and fusion Thoracic ten to the ilium;  Surgeon: Garry Kansas, MD;  Location: [facility] OR;  Service: Neurosurgery;  Laterality: N/A;   SPINAL CORD STIMULATOR INSERTION N/A 01/07/2013   Procedure:  SPINAL CORD STIMULATOR INSERTION;  Surgeon: Darryl Endow, MD;  Location: [facility]O [facility];  Service: Neurosurgery;  Laterality: N/A;   SPINAL CORD STIMULATOR INSERTION N/A 02/09/2019   Procedure: REPLACEMENT OF LUMBAR SPINAL CORD STIMULATOR;  Surgeon: Garry Kansas, MD;  Location: [facility] OR;  Service: Neurosurgery;  Laterality: N/A;  Thoracic/Lumbar spine   TONSILLECTOMY     patient denies   TOTAL HIP ARTHROPLASTY Right 10/03/2016   Procedure: TOTAL HIP ARTHROPLASTY;  Surgeon: Marlena Sima, MD;  Location: [facility] OR;  Service: Orthopedics;  Laterality: Right;   TOTAL KNEE ARTHROPLASTY  Right 11/09/2020   Procedure: TOTAL KNEE ARTHROPLASTY;  Surgeon: Marlena Sima, MD;  Location: [facility] ORS;  Service: Orthopedics;  Laterality: Right;   TOTAL KNEE REVISION Right 04/02/2022   Procedure: TOTAL KNEE REVISION;  Surgeon: Murleen Arms, MD;  Location: [facility] OR;  Service: Orthopedics;  Laterality: Right;   WISDOM TOOTH EXTRACTION     hx of    Allergies  Allergies  Allergen Reactions   Aquacel Extra Hydrofiber [Wound Dressings] Other (See Comments)    Blisters to the skin   Brilinta  [Ticagrelor ] Shortness Of Breath and Other (See Comments)    CHEST PAIN    Methylprednisolone Other (See Comments)    DIAPHORESIS, HYPOTENSION   Prednisone  Other (See Comments)    DIAPHORESIS, HYPOTENSION   Toradol [Ketorolac Tromethamine] Other (See Comments)    DIAPHORESIS, HYPOTENSION     Adhesive [Tape] Rash and Other (See  Comments)    "blisters" ( NO SURGICAL TAPE, PLEASE)    Home Medications    Prior to Admission medications   Medication Sig Start Date End Date Taking? Authorizing Provider  ACCU-CHEK AVIVA PLUS test strip  04/30/19   [provider]  Accu-Chek Softclix Lancets lancets 4 (four) times daily. 07/12/20   [provider]  acetaminophen  (TYLENOL ) 500 MG tablet Take 1,000 mg by mouth 2 (two) times daily as needed (pain.).    [provider]  ascorbic acid (VITAMIN C) 500 MG tablet Take 500 mg by mouth daily.    [provider]  aspirin  EC 81 MG tablet Take 81 mg by mouth daily. Swallow whole.    [provider]  atorvastatin  (LIPITOR ) 10 MG tablet Take 1 tablet (10 mg total) by mouth at bedtime. Patient not taking: Reported on 07/22/2023 06/03/22     atorvastatin  (LIPITOR ) 10 MG tablet Take 1 tablet (10 mg total) by mouth at bedtime. 02/11/23     atorvastatin  (LIPITOR ) 10 MG tablet Take 1 tablet (10 mg total) by mouth at bedtime. Patient not taking: Reported on 07/22/2023 05/21/23     bisacodyl  (DULCOLAX) 5 MG EC tablet Take by mouth as  directed. 03/26/23     bisacodyl  5 MG EC tablet Take 4 tablets by mouth as directed on your prep instructions @ 3 PM on 4/6. Patient not taking: Reported on 07/22/2023 04/17/23   Nannette Babe, MD  Buprenorphine  HCl (BELBUCA ) 900 MCG FILM Place 1 (one) Film inside cheeks two times daily Patient not taking: Reported on 07/22/2023 05/20/22     Buprenorphine  HCl (BELBUCA ) 900 MCG FILM Place 1 (one) Film inside cheeks two times daily 07/15/23     carvedilol  (COREG ) 12.5 MG tablet Take 1 tablet (12.5 mg total) by mouth 2 (two) times daily with a meal. 12/08/22   Hugh Madura, MD  chlorthalidone  (HYGROTON ) 25 MG tablet Take 1 tablet (25 mg total) by mouth daily. **must keep dr appointment in Oct 2024* 12/08/22   Hugh Madura, MD  dapagliflozin  propanediol (FARXIGA ) 10 MG TABS tablet Take 1 tablet (10 mg total) by mouth daily. 05/21/23     diclofenac  Sodium (VOLTAREN  ARTHRITIS PAIN) 1 % GEL Apply small amount to affected area twice a day as needed Patient not taking: Reported on 07/22/2023 07/03/22     diclofenac  Sodium (VOLTAREN ) 1 % GEL Apply small amount to the affected area 3 times a day as needed for pain Patient not taking: Reported on 07/22/2023 12/16/22     diclofenac  Sodium (VOLTAREN ) 1 % GEL Apply a small amount to affected area 2 times a day as needed for pain 01/12/23     diltiazem  (DILT-XR) 180 MG 24 hr capsule Take 1 capsule (180 mg total) by mouth daily. Patient not taking: Reported on 07/22/2023 12/08/22   Hugh Madura, MD  diltiazem  (DILT-XR) 180 MG 24 hr capsule Take 1 capsule (180 mg total) by mouth daily. 05/21/23     docusate sodium  (COLACE) 100 MG capsule Take 1 capsule (100 mg total) by mouth 2 (two) times daily. Patient taking differently: Take 300 mg by mouth 2 (two) times daily. 02/10/19   Bergman, Meghan D, NP  DULoxetine  (CYMBALTA ) 60 MG capsule Take 1 capsule (60 mg total) by mouth daily. Patient not taking: Reported on 07/22/2023 02/11/23     DULoxetine  (CYMBALTA ) 60 MG capsule  Take 1 capsule (60 mg total) by mouth daily. 05/21/23     glipiZIDE  (GLUCOTROL  XL) 10  MG 24 hr tablet Take 2 tablets (20 mg total) by mouth daily with breakfast. 11/12/22     glipiZIDE  (GLUCOTROL  XL) 10 MG 24 hr tablet Take 2 tablets (20 mg total) by mouth daily with breakfast. 02/11/23     glipiZIDE  (GLUCOTROL  XL) 10 MG 24 hr tablet Take 2 tablets (20 mg total) by mouth daily with breakfast. Patient not taking: Reported on 07/22/2023 05/21/23     glucose blood ([employer] BLOOD GLUCOSE TEST) test strip Use to test blood sugar 4 times a day 01/28/23     glucose blood ([employer] BLOOD GLUCOSE TEST) test strip Use 1 Strip four times daily, accu-chek aviva plus Patient not taking: Reported on 07/22/2023 02/11/23     glucose blood test strip Use to test 4 times a day as directed Patient not taking: Reported on 06/02/2023 12/09/21     glucose blood test strip Use as directed 4 times daily 05/21/23     linaclotide  (LINZESS ) 145 MCG CAPS capsule Take 1 capsule (145 mcg total) by mouth daily. Patient not taking: Reported on 07/22/2023 11/12/22     linaclotide  (LINZESS ) 145 MCG CAPS capsule Take 145 mcg by mouth daily before breakfast. Patient not taking: Reported on 07/22/2023    [provider]  linaclotide  (LINZESS ) 145 MCG CAPS capsule Take 1 capsule (145 mcg total) by mouth daily. 05/21/23     lisinopril  (ZESTRIL ) 20 MG tablet Take 1 tablet (20 mg total) by mouth daily. 02/11/23     lisinopril  (ZESTRIL ) 20 MG tablet Take 1 tablet (20 mg total) by mouth daily. Patient not taking: Reported on 07/22/2023 05/21/23     lubiprostone  (AMITIZA ) 24 MCG capsule Take 2 capsules (48 mcg total) by mouth at bedtime. Patient not taking: Reported on 03/18/2023 08/19/21     lubiprostone  (AMITIZA ) 24 MCG capsule Take 1 capsule (24 mcg total) by mouth 2 (two) times daily. 02/11/23     lubiprostone  (AMITIZA ) 24 MCG capsule Take 2 capsules (48 mcg total) by mouth at bedtime. Patient not taking: Reported on 07/22/2023 05/21/23      metFORMIN  (GLUCOPHAGE ) 1000 MG tablet Take 1 tablet (1,000 mg total) by mouth 2 (two) times daily. Patient not taking: Reported on 07/22/2023 11/12/22     metFORMIN  (GLUCOPHAGE ) 1000 MG tablet Take 1 tablet (1,000 mg total) by mouth 2 (two) times daily. 02/11/23     metFORMIN  (GLUCOPHAGE ) 1000 MG tablet Take 1 tablet (1,000 mg total) by mouth 2 (two) times daily. Patient not taking: Reported on 07/22/2023 05/21/23     methocarbamol  (ROBAXIN ) 500 MG tablet Take 1 tablet (500 mg total) by mouth every 6 (six) hours as needed for pain. Patient not taking: Reported on 07/22/2023 05/13/22     methocarbamol  (ROBAXIN ) 750 MG tablet Take 1 tablet (750 mg total) by mouth 2 (two) times daily as needed. 11/12/22     Multiple Vitamin (MULTIVITAMIN WITH MINERALS) TABS tablet Take 1 tablet by mouth daily.    [provider]  mupirocin  ointment (BACTROBAN ) 2 % Apply to affected skin 2 times per day 05/21/23     naloxone  (NARCAN ) nasal spray 4 mg/0.1 mL Place 1 spray into the nose as needed. If found unresposive then spray this into nose and call 911 immediately 11/16/21     naloxone  (NARCAN ) nasal spray 4 mg/0.1 mL Use 1 (one)  spray as needed, IF FOUND UNRESPONSIVE THEN SPRAY THIS INTO NOSE AND CALL 911 IMMEDIATELY 04/16/23     omeprazole  (PRILOSEC) 40 MG capsule Take 1 capsule (40 mg  total) by mouth daily. Patient not taking: Reported on 07/22/2023 12/10/22     omeprazole  (PRILOSEC) 40 MG capsule Take 1 capsule (40 mg total) by mouth daily. 12/22/22     omeprazole  (PRILOSEC) 40 MG capsule Take 1 capsule (40 mg total) by mouth daily. 05/21/23     oxyCODONE  (OXY IR/ROXICODONE ) 5 MG immediate release tablet Take 1 tablet (5 mg total) by mouth every 4 (four) hours as needed for pain. Patient not taking: Reported on 07/22/2023 06/24/22     Oxycodone  HCl 20 MG TABS Take 1 tablet (20 mg total) by mouth every 4 (four) hours as needed for pain. Patient not taking: Reported on 07/22/2023 04/16/22     Oxycodone  HCl 20 MG TABS  Take 1 tablet (20 mg total) by mouth every 6 (six) hours as needed. Patient not taking: Reported on 07/22/2023 12/16/22     Oxycodone  HCl 20 MG TABS Take 1 tablet (20 mg total) by mouth every 6 (six) hours as needed. Patient not taking: Reported on 07/22/2023 02/16/23     Oxycodone  HCl 20 MG TABS Take 1 tablet (20 mg total) by mouth every 6 (six) hours. 03/17/23     Oxycodone  HCl 20 MG TABS Take 1 tablet (20 mg total) by mouth every 6 (six) hours as needed. 07/15/23     PEG 3350 -KCl-NaCl-NaSulf-MgSul (SUFLAVE ) 178.7 g SOLR Take as directed. 03/30/23   Pyrtle, Amber Bail, MD  PEG 3350 -KCl-NaCl-NaSulf-MgSul (SUFLAVE ) 178.7 g SOLR Take 1 kit by mouth as directed. 03/30/23   Pyrtle, Amber Bail, MD  polyethylene glycol powder (MIRALAX ) 17 GM/SCOOP powder Take as directed 03/26/23     Potassium Chloride  ER 20 MEQ TBCR Take 1 tablet (20 mEq total) by mouth 2 (two) times daily. Patient not taking: Reported on 07/22/2023 04/16/22     Potassium Chloride  ER 20 MEQ TBCR Take 1 tablet (20 mEq total) by mouth daily. Patient not taking: Reported on 07/22/2023 12/10/22     Potassium Chloride  ER 20 MEQ TBCR Take 1 tablet (20 mEq total) by mouth daily. 04/14/23     Potassium Chloride  ER 20 MEQ TBCR Take 1 tablet (20 mEq total) by mouth daily. Patient not taking: Reported on 07/22/2023 05/21/23     pregabalin  (LYRICA ) 150 MG capsule Take 1 capsule by mouth 2 times daily 07/21/22     pregabalin  (LYRICA ) 150 MG capsule Take 1 capsule (150 mg total) by mouth 2 (two) times daily. 01/15/23     pregabalin  (LYRICA ) 150 MG capsule Take 1 capsule (150 mg total) by mouth 2 (two) times daily. 07/15/23     sildenafil  (VIAGRA ) 100 MG tablet Take 1 tablet (100 mg total) by mouth daily as needed. 05/07/21     sildenafil  (VIAGRA ) 100 MG tablet Take 1 tablet (100 mg total) by mouth daily. 05/21/23     sildenafil  (VIAGRA ) 100 MG tablet Take 1 tablet (100 mg total) by mouth once as needed for up to 1 dose. 07/17/23     sildenafil  (VIAGRA ) 100 MG tablet Take 1  tablet (100 mg total) by mouth daily as needed. 07/21/23   Chares Commons, PA-C    Physical Exam    Vital Signs:  Haskel Link does not have vital signs available for review today.  Given telephonic nature of communication, physical exam is limited. AAOx3. NAD. Normal affect.  Speech and respirations are unlabored.  Accessory Clinical Findings    None  Assessment & Plan    1.  Preoperative Cardiovascular Risk Assessment:  According to  the Revised Cardiac Risk Index (RCRI), his Perioperative Risk of Major Cardiac Event is (%): 0.9. His Functional Capacity in METs is: 7.99 according to the [facility] Activity Status Index (DASI). Therefore, based on ACC/AHA guidelines, patient would be at acceptable risk for the planned procedure without further cardiovascular testing.  The patient was advised that if he develops new symptoms prior to surgery to contact our office to arrange for a follow-up visit, and he verbalized understanding.  Regarding ASA therapy, we recommend continuation of ASA throughout the perioperative period.  However, if the surgeon feels that cessation of ASA is required in the perioperative period, it may be stopped 5-7 days prior to surgery with a plan to resume it as soon as felt to be feasible from a surgical standpoint in the post-operative period (patient was previously cleared to hold aspirin  per MD).    A copy of this note will be routed to requesting surgeon.  Time:   Today, I have spent 6 minutes with the patient with telehealth technology discussing medical history, symptoms, and management plan.     Jude Norton, NP  07/24/2023, 11:45 AM

## 2023-07-27 ENCOUNTER — Other Ambulatory Visit (HOSPITAL_COMMUNITY): Payer: Self-pay

## 2023-07-27 ENCOUNTER — Encounter (HOSPITAL_BASED_OUTPATIENT_CLINIC_OR_DEPARTMENT_OTHER)
Admission: RE | Admit: 2023-07-27 | Discharge: 2023-07-27 | Disposition: A | Discharge status: Home / Self Care | Source: Ambulatory Visit | Attending: Otolaryngology | Admitting: Otolaryngology

## 2023-07-27 DIAGNOSIS — Z01818 Encounter for other preprocedural examination: Secondary | ICD-10-CM | POA: Insufficient documentation

## 2023-07-27 LAB — BASIC METABOLIC PANEL WITH GFR
Anion gap: 11 (ref 5–15)
BUN: 17 mg/dL (ref 8–23)
CO2: 24 mmol/L (ref 22–32)
Calcium: 8.9 mg/dL (ref 8.9–10.3)
Chloride: 100 mmol/L (ref 98–111)
Creatinine, Ser: 1.01 mg/dL (ref 0.61–1.24)
GFR, Estimated: >60 mL/min (ref >60)
Glucose, Bld: 117 mg/dL — ABNORMAL HIGH (ref 70–99)
Potassium: 4.1 mmol/L (ref 3.5–5.1)
Sodium: 135 mmol/L (ref 135–145)

## 2023-07-27 NOTE — Progress Notes (Signed)

## 2023-07-27 NOTE — Progress Notes (Signed)
 Discussed case with Dr. Joann Mu.   Per Dr. Joann Mu, patient will need to be evaluated on day of surgery.

## 2023-07-27 NOTE — Telephone Encounter (Signed)
 Chris Countryman called our office stating that the our clearance that our team sent over from the televisit for preop clearance is not being accepted.   I called and s/w Chris Countryman, who tells me that the surgery day center called her and stated that anesthesiology says the pt needs to be seen in th office. I explained to Chris Countryman that our preop team felt the pt was acceptable per our protocol for a tele visit for preop clearance. I reviewed in office schedules while speaking with Chris Countryman and stated the 1st in office appt I have is 08/05/23 @ 10:55 with Hao Meng, PAC. Chris Countryman said she thinks she can schedule his surgery for 08/06/23. I did tell her that I will place the pt on a wait list as well.   I assured Latasha with Dr. Tellis Feathers office that I will call the pt and his wife and inform them of the appt I am holding 08/05/23.   I then called the pt and his wife and left a detailed message that I have scheduled the pt an appt 08/05/23 @ 10:55 with Hao Meng, PAC for preop clearance. Left message that our office was informed the anesthesiologist stated the pt needs to be seen in the office.   I will also update the preop APP as well and the [facility] for the upcoming appt.

## 2023-07-27 NOTE — Anesthesia Preprocedure Evaluation (Addendum)
 Anesthesia Evaluation  Patient identified by MRN, date of birth, ID band Patient awake    Reviewed: Allergy & Precautions, NPO status , Patient's Chart, lab work & pertinent test results, reviewed documented beta blocker date and time   History of Anesthesia Complications Negative for: history of anesthetic complications  Airway Mallampati: I  TM Distance: >3 FB Neck ROM: Full    Dental no notable dental hx. (+) Edentulous Upper, Edentulous Lower   Pulmonary former smoker   Pulmonary exam normal breath sounds clear to auscultation       Cardiovascular hypertension, Pt. on medications and Pt. on home beta blockers (-) angina + CAD, + Past MI (2017), + Cardiac Stents and + Peripheral Vascular Disease  Normal cardiovascular exam Rhythm:Regular Rate:Normal  '20 ECHO: EF 45-50%. The cavity size was normal. There is mildly  increased left ventricular wall thickness. Left ventricular diastolic  Doppler parameters are consistent with impaired relaxation.   2. Abnormal septal motion inferior basal hypokinesis.   3. The RV has normal systolic function. The cavity was normal. There is no increase in right ventricular wall thickness.   4. Mild thickening of the mitral valve leaflet. Mild calcification of the mitral valve leaflet. There is mild mitral annular calcification present.   5. The aortic valve is tricuspid. Mild thickening of the aortic valve. Sclerosis without any evidence of stenosis of the aortic valve.    '20 Stress:  EF: 42%. The left ventricular EF is moderately decreased (30-44%). There was no ST segment deviation noted during stress. Defect 1: There is a small defect of severe severity present in the basal inferior and mid inferior location. Findings consistent with prior Inferior wall myocardial infarction. This is an intermediate risk study based on reduction of EF . There is no evidence of ischemia.     Neuro/Psych Chronic back pain    GI/Hepatic Neg liver ROS,GERD  Medicated and Controlled,,  Endo/Other  diabetes, Type 2, Oral Hypoglycemic Agents  BMI 35.44  Renal/GU negative Renal ROS     Musculoskeletal  (+) Arthritis , Osteoarthritis,    Abdominal  (+) + obese  Peds  Hematology negative hematology ROS (+)   Anesthesia Other Findings   Reproductive/Obstetrics                             Anesthesia Physical Anesthesia Plan  ASA: 3  Anesthesia Plan: General   Post-op Pain Management: Tylenol  PO (pre-op)*   Induction: Intravenous  PONV Risk Score and Plan: 1 and Ondansetron , Treatment may vary due to age or medical condition, Midazolam  and Dexamethasone   Airway Management Planned: Oral ETT  Additional Equipment: None  Intra-op Plan:   Post-operative Plan: Extubation in OR  Informed Consent: I have reviewed the patients History and Physical, chart, labs and discussed the procedure including the risks, benefits and alternatives for the proposed anesthesia with the patient or authorized representative who has indicated his/her understanding and acceptance.       Plan Discussed with: CRNA and Surgeon  Anesthesia Plan Comments:        Anesthesia Quick Evaluation

## 2023-07-28 ENCOUNTER — Other Ambulatory Visit: Payer: Self-pay | Admitting: Otolaryngology

## 2023-07-28 ENCOUNTER — Ambulatory Visit (HOSPITAL_COMMUNITY): Payer: Self-pay | Admitting: Anesthesiology

## 2023-07-28 ENCOUNTER — Ambulatory Visit (HOSPITAL_COMMUNITY)
Admission: RE | Admit: 2023-07-28 | Discharge: 2023-07-28 | Disposition: A | Discharge status: Home / Self Care | Attending: Otolaryngology | Admitting: Otolaryngology

## 2023-07-28 ENCOUNTER — Encounter (HOSPITAL_COMMUNITY)
Admission: RE | Disposition: A | Discharge status: Home / Self Care | Payer: Self-pay | Source: Home / Self Care | Attending: Otolaryngology

## 2023-07-28 ENCOUNTER — Other Ambulatory Visit (HOSPITAL_COMMUNITY): Payer: Self-pay

## 2023-07-28 ENCOUNTER — Encounter (HOSPITAL_COMMUNITY): Payer: Self-pay | Admitting: Otolaryngology

## 2023-07-28 ENCOUNTER — Other Ambulatory Visit: Payer: Self-pay

## 2023-07-28 DIAGNOSIS — E118 Type 2 diabetes mellitus with unspecified complications: Secondary | ICD-10-CM

## 2023-07-28 DIAGNOSIS — Z7984 Long term (current) use of oral hypoglycemic drugs: Secondary | ICD-10-CM | POA: Diagnosis not present

## 2023-07-28 DIAGNOSIS — J387 Other diseases of larynx: Secondary | ICD-10-CM | POA: Insufficient documentation

## 2023-07-28 DIAGNOSIS — I10 Essential (primary) hypertension: Secondary | ICD-10-CM | POA: Diagnosis not present

## 2023-07-28 DIAGNOSIS — Z87891 Personal history of nicotine dependence: Secondary | ICD-10-CM | POA: Diagnosis not present

## 2023-07-28 DIAGNOSIS — M1990 Unspecified osteoarthritis, unspecified site: Secondary | ICD-10-CM | POA: Diagnosis not present

## 2023-07-28 DIAGNOSIS — I252 Old myocardial infarction: Secondary | ICD-10-CM | POA: Diagnosis not present

## 2023-07-28 DIAGNOSIS — I2510 Atherosclerotic heart disease of native coronary artery without angina pectoris: Secondary | ICD-10-CM

## 2023-07-28 DIAGNOSIS — E119 Type 2 diabetes mellitus without complications: Secondary | ICD-10-CM | POA: Insufficient documentation

## 2023-07-28 DIAGNOSIS — I739 Peripheral vascular disease, unspecified: Secondary | ICD-10-CM | POA: Insufficient documentation

## 2023-07-28 DIAGNOSIS — M549 Dorsalgia, unspecified: Secondary | ICD-10-CM | POA: Insufficient documentation

## 2023-07-28 DIAGNOSIS — Z955 Presence of coronary angioplasty implant and graft: Secondary | ICD-10-CM | POA: Diagnosis not present

## 2023-07-28 DIAGNOSIS — Z79899 Other long term (current) drug therapy: Secondary | ICD-10-CM | POA: Diagnosis not present

## 2023-07-28 DIAGNOSIS — K219 Gastro-esophageal reflux disease without esophagitis: Secondary | ICD-10-CM | POA: Diagnosis not present

## 2023-07-28 DIAGNOSIS — E1151 Type 2 diabetes mellitus with diabetic peripheral angiopathy without gangrene: Secondary | ICD-10-CM | POA: Insufficient documentation

## 2023-07-28 DIAGNOSIS — G8929 Other chronic pain: Secondary | ICD-10-CM | POA: Insufficient documentation

## 2023-07-28 HISTORY — PX: DIRECT LARYNGOSCOPY: SHX5326

## 2023-07-28 LAB — CBC
HCT: 40.4 % (ref 39.0–52.0)
Hemoglobin: 12.9 g/dL — ABNORMAL LOW (ref 13.0–17.0)
MCH: 25.1 pg — ABNORMAL LOW (ref 26.0–34.0)
MCHC: 31.9 g/dL (ref 30.0–36.0)
MCV: 78.6 fL — ABNORMAL LOW (ref 80.0–100.0)
Platelets: 233 10*3/uL (ref 150–400)
RBC: 5.14 MIL/uL (ref 4.22–5.81)
RDW: 16.6 % — ABNORMAL HIGH (ref 11.5–15.5)
WBC: 7.8 10*3/uL (ref 4.0–10.5)
nRBC: 0 % (ref 0.0–0.2)

## 2023-07-28 LAB — GLUCOSE, CAPILLARY
Glucose-Capillary: 132 mg/dL — ABNORMAL HIGH (ref 70–99)
Glucose-Capillary: 115 mg/dL — ABNORMAL HIGH (ref 70–99)

## 2023-07-28 SURGERY — LARYNGOSCOPY, DIRECT
Anesthesia: General | Site: Mouth | Laterality: Bilateral

## 2023-07-28 MED ORDER — ORAL CARE MOUTH RINSE: 15.0000 mL | Freq: Once | OROMUCOSAL | Status: AC

## 2023-07-28 MED ORDER — LACTATED RINGERS IV SOLN: INTRAVENOUS | Status: DC

## 2023-07-28 MED ORDER — ACETAMINOPHEN 10 MG/ML IV SOLN
INTRAVENOUS | Status: AC
  Filled 2023-07-28: qty 100

## 2023-07-28 MED ORDER — INSULIN ASPART 100 UNIT/ML IJ SOLN: 0.0000 [IU] | INTRAMUSCULAR | Status: DC | PRN

## 2023-07-28 MED ORDER — ACETAMINOPHEN 10 MG/ML IV SOLN
1000.0000 mg | Freq: Once | INTRAVENOUS | Status: DC | PRN
  Administered 2023-07-28: 1000 mg via INTRAVENOUS

## 2023-07-28 MED ORDER — SUGAMMADEX SODIUM 200 MG/2ML IV SOLN
INTRAVENOUS | Status: DC | PRN
  Administered 2023-07-28 (×2): 200 mg via INTRAVENOUS

## 2023-07-28 MED ORDER — ROCURONIUM BROMIDE 10 MG/ML (PF) SYRINGE
PREFILLED_SYRINGE | INTRAVENOUS | Status: DC | PRN
  Administered 2023-07-28: 60 mg via INTRAVENOUS

## 2023-07-28 MED ORDER — FENTANYL CITRATE (PF) 250 MCG/5ML IJ SOLN
INTRAMUSCULAR | Status: AC
  Filled 2023-07-28: qty 5

## 2023-07-28 MED ORDER — FENTANYL CITRATE (PF) 250 MCG/5ML IJ SOLN
INTRAMUSCULAR | Status: DC | PRN
Start: 2023-07-28 — End: 2023-07-28
  Administered 2023-07-28: 75 ug via INTRAVENOUS

## 2023-07-28 MED ORDER — ONDANSETRON HCL 4 MG/2ML IJ SOLN
INTRAMUSCULAR | Status: DC | PRN
  Administered 2023-07-28: 4 mg via INTRAVENOUS

## 2023-07-28 MED ORDER — OXYMETAZOLINE HCL 0.05 % NA SOLN
NASAL | Status: AC
  Filled 2023-07-28: qty 30

## 2023-07-28 MED ORDER — EPINEPHRINE HCL (NASAL) 0.1 % NA SOLN
NASAL | Status: AC
  Filled 2023-07-28: qty 30

## 2023-07-28 MED ORDER — HYDROMORPHONE HCL 1 MG/ML IJ SOLN
INTRAMUSCULAR | Status: AC
  Filled 2023-07-28: qty 1

## 2023-07-28 MED ORDER — MIDAZOLAM HCL 2 MG/2ML IJ SOLN
INTRAMUSCULAR | Status: AC
Start: 2023-07-28 — End: ?
  Filled 2023-07-28: qty 2

## 2023-07-28 MED ORDER — LIDOCAINE 2% (20 MG/ML) 5 ML SYRINGE
INTRAMUSCULAR | Status: DC | PRN
  Administered 2023-07-28: 100 mg via INTRAVENOUS

## 2023-07-28 MED ORDER — PHENYLEPHRINE 80 MCG/ML (10ML) SYRINGE FOR IV PUSH (FOR BLOOD PRESSURE SUPPORT)
PREFILLED_SYRINGE | INTRAVENOUS | Status: DC | PRN
  Administered 2023-07-28: 80 ug via INTRAVENOUS

## 2023-07-28 MED ORDER — MUPIROCIN 2 % EX OINT
TOPICAL_OINTMENT | CUTANEOUS | 0 refills | Status: AC
  Filled 2023-07-28: qty 44, 30d supply, fill #0

## 2023-07-28 MED ORDER — HYDROMORPHONE HCL 1 MG/ML IJ SOLN
0.2500 mg | INTRAMUSCULAR | Status: DC | PRN
  Administered 2023-07-28 (×3): 0.5 mg via INTRAVENOUS

## 2023-07-28 MED ORDER — OXYCODONE HCL 5 MG/5ML PO SOLN
ORAL | Status: AC
  Filled 2023-07-28: qty 5

## 2023-07-28 MED ORDER — ONDANSETRON HCL 4 MG/2ML IJ SOLN
4.0000 mg | Freq: Once | INTRAMUSCULAR | Status: DC | PRN
Start: 2023-07-28 — End: 2023-07-28

## 2023-07-28 MED ORDER — CHLORHEXIDINE GLUCONATE 0.12 % MT SOLN
15.0000 mL | Freq: Once | OROMUCOSAL | Status: AC
  Administered 2023-07-28: 15 mL via OROMUCOSAL
  Filled 2023-07-28: qty 15

## 2023-07-28 MED ORDER — PROPOFOL 10 MG/ML IV BOLUS
INTRAVENOUS | Status: DC | PRN
  Administered 2023-07-28: 150 ug/kg/min via INTRAVENOUS
  Administered 2023-07-28: 160 mg via INTRAVENOUS

## 2023-07-28 MED ORDER — MIDAZOLAM HCL 2 MG/2ML IJ SOLN
INTRAMUSCULAR | Status: DC | PRN
  Administered 2023-07-28: 2 mg via INTRAVENOUS

## 2023-07-28 MED ORDER — OXYCODONE HCL 5 MG PO TABS: 5.0000 mg | ORAL_TABLET | Freq: Once | ORAL | Status: AC | PRN

## 2023-07-28 MED ORDER — OXYCODONE HCL 5 MG/5ML PO SOLN
5.0000 mg | Freq: Once | ORAL | Status: AC | PRN
  Administered 2023-07-28: 5 mg via ORAL

## 2023-07-28 MED ORDER — 0.9 % SODIUM CHLORIDE (POUR BTL) OPTIME
TOPICAL | Status: DC | PRN
  Administered 2023-07-28: 1000 mL

## 2023-07-28 SURGICAL SUPPLY — 18 items
BAG COUNTER SPONGE SURGICOUNT (BAG) ×1 IMPLANT
CANISTER SUCTION 3000ML PPV (SUCTIONS) ×1 IMPLANT
CNTNR URN SCR LID CUP LEK RST (MISCELLANEOUS) IMPLANT
COVER BACK TABLE 60X90IN (DRAPES) ×1 IMPLANT
COVER MAYO STAND STRL (DRAPES) ×1 IMPLANT
DRAPE HALF SHEET 40X57 (DRAPES) ×1 IMPLANT
GAUZE 4X4 16PLY ~~LOC~~+RFID DBL (SPONGE) ×1 IMPLANT
GLOVE BIO SURGEON STRL SZ7.5 (GLOVE) ×1 IMPLANT
GOWN STRL REUS W/ TWL LRG LVL3 (GOWN DISPOSABLE) IMPLANT
GUARD TEETH (MISCELLANEOUS) ×1 IMPLANT
KIT TURNOVER KIT B (KITS) ×1 IMPLANT
NS IRRIG 1000ML POUR BTL (IV SOLUTION) ×1 IMPLANT
PAD ARMBOARD POSITIONER FOAM (MISCELLANEOUS) ×2 IMPLANT
POSITIONER HEAD DONUT 9IN (MISCELLANEOUS) IMPLANT
SOL ANTI FOG 6CC (MISCELLANEOUS) IMPLANT
SURGILUBE 2OZ TUBE FLIPTOP (MISCELLANEOUS) IMPLANT
TOWEL GREEN STERILE FF (TOWEL DISPOSABLE) ×2 IMPLANT
TUBE CONNECTING 12X1/4 (SUCTIONS) ×1 IMPLANT

## 2023-07-28 NOTE — Transfer of Care (Signed)
 Immediate Anesthesia Transfer of Care Note  Patient: Thomas Mcclure  Procedure(s) Performed: LARYNGOSCOPY, DIRECT W/BIOPSY (Bilateral: Mouth)  Patient Location: PACU  Anesthesia Type:General  Level of Consciousness: awake  Airway & Oxygen Therapy: Patient Spontanous Breathing and Patient connected to nasal cannula oxygen  Post-op Assessment: Report given to RN and Post -op Vital signs reviewed and stable  Post vital signs: Reviewed and stable  Last Vitals:  Vitals Value Taken Time  BP 102/81 07/28/23 1239  Temp 36.1 C 07/28/23 1239  Pulse 76 07/28/23 1242  Resp 17 07/28/23 1242  SpO2 97 % 07/28/23 1242  Vitals shown include unfiled device data.  Last Pain:  Vitals:   07/28/23 1033  TempSrc:   PainSc: 0-No pain         Complications: No notable events documented.

## 2023-07-28 NOTE — Anesthesia Postprocedure Evaluation (Signed)
 Anesthesia Post Note  Patient: Thomas Mcclure  Procedure(s) Performed: LARYNGOSCOPY, DIRECT W/BIOPSY (Bilateral: Mouth)     Patient location during evaluation: PACU Anesthesia Type: General Level of consciousness: awake and alert Pain management: pain level controlled Vital Signs Assessment: post-procedure vital signs reviewed and stable Respiratory status: spontaneous breathing, nonlabored ventilation, respiratory function stable and patient connected to nasal cannula oxygen Cardiovascular status: blood pressure returned to baseline and stable Postop Assessment: no apparent nausea or vomiting Anesthetic complications: no  No notable events documented.  Last Vitals:  Vitals:   07/28/23 1330 07/28/23 1345  BP: 119/82 122/77  Pulse: 66 67  Resp: 17 11  Temp:  36.6 C  SpO2: 100% 98%    Last Pain:  Vitals:   07/28/23 1345  TempSrc:   PainSc: 4                  Rosalita Combe

## 2023-07-28 NOTE — H&P (Signed)
 Thomas Mcclure is an 68 y.o. male.   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  Previous biopsy was negative for carcinoma but did not include DIF testing.  With continued pain and inability to tolerate oral steroids, he presents for a repeat biopsy.  Past Medical History:  Diagnosis Date   Baker's cyst    Left calf   CAD in native artery, 07/15/15 PCI of RCA with DES 07/16/2015   a.   NSTEMI 5/17: LHC - pLAD 20, pLCx 20, OM1 30, pRCA 100, EF 45-50%>> PCI: 2.5 x 24 mm Promus DES to RCA  //  b.   Echo 5/17: mild LVH, EF 50-55%, no RWMA, mod RVE //  c. 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 Wright Heal [phone]   Iliac artery aneurysm (HCC)    1.5 cm left IIA 09/15/21 CTA; follow-up in ~ 18 months per vascular surgeon   Ischemic cardiomyopathy    a. LV-gram at time of LHC in 6/17 with EF 35-45%  //  b. Echo 7/17: EF 45-50%, inferior HK, grade 1 diastolic dysfunction, mildly dilated aortic root, moderately reduced RVSF, mild RAE   Myocardial infarction (HCC) 2017   Neuromuscular disorder (HCC)    "with nerve damage"   Numbness and tingling of both lower extremities    "on the outside of both sides" (02/18/2017)   Peripheral arterial disease (HCC)    Rheumatoid arthritis (HCC)    RA   Tachycardia    Type II diabetes mellitus (HCC)    diet controlled    Past Surgical History:  Procedure Laterality Date   BACK SURGERY     x3   bilateral cataract curgery      CARDIAC CATHETERIZATION N/A  07/15/2015   Procedure: Left Heart Cath and Coronary Angiography;  Surgeon: Peter M Swaziland, MD;  Location: [facility];  Service: Cardiovascular;  Laterality: N/A;   CARDIAC CATHETERIZATION N/A 07/15/2015   Procedure: Coronary Stent Intervention;  Surgeon: Peter M Swaziland, MD;  Location: [facility] CV LAB;  Service: Cardiovascular;  Laterality: N/A;   CARDIAC CATHETERIZATION N/A 08/07/2015   Procedure: Left Heart Cath and Coronary Angiography;  Surgeon: Arty Binning, MD;  Location: [facility] INVASIVE CV LAB;  Service: Cardiovascular;  Laterality: N/A;   CORONARY ANGIOPLASTY WITH STENT PLACEMENT  07/2015   DIRECT LARYNGOSCOPY N/A 03/23/2023   Procedure: MICRODIRECT LARYNGOSCOPY WITH BIOPSY;  Surgeon: Virgina Grills, MD;  Location: [facility];  Service: ENT;  Laterality: N/A;   LEFT HEART CATH AND CORONARY ANGIOGRAPHY N/A 02/19/2017   Procedure: LEFT HEART CATH AND CORONARY ANGIOGRAPHY;  Surgeon: Swaziland, Peter M, MD;  Location: [facility] INVASIVE CV LAB;  Service: Cardiovascular;  Laterality: N/A;   LUMBAR FUSION  04/11/2019   REVISION OF THORACOLUMBAR FUSION    LUMBAR SPINE SURGERY  03/2009  & 2012   MASS EXCISION N/A 04/19/2012   Procedure: removal of posterior cervical  lipoma;  Surgeon: Elder Greening, MD;  Location: [facility]URO [facility];  Service: Neurosurgery;  Laterality: N/A;  Removal of posterior cervical lipoma   POPLITEAL SYNOVIAL CYST EXCISION Left    "opened up behind my knee; scraped out arthritis"   POSTERIOR LUMBAR FUSION 4 LEVEL N/A 11/22/2018   Procedure: Posterior Lateral and Interbody fusion - Lumbar one-Lumbar two; explore fusion, posterior instrumentation and fusion Thoracic ten to the ilium;  Surgeon: Garry Kansas, MD;  Location: [facility] OR;  Service: Neurosurgery;  Laterality: N/A;   SPINAL CORD STIMULATOR INSERTION N/A 01/07/2013   Procedure:  SPINAL CORD STIMULATOR INSERTION;  Surgeon: Darryl Endow, MD;  Location: [facility];  Service: Neurosurgery;  Laterality: N/A;    SPINAL CORD STIMULATOR INSERTION N/A 02/09/2019   Procedure: REPLACEMENT OF LUMBAR SPINAL CORD STIMULATOR;  Surgeon: Garry Kansas, MD;  Location: [facility] OR;  Service: Neurosurgery;  Laterality: N/A;  Thoracic/Lumbar spine   TONSILLECTOMY     patient denies   TOTAL HIP ARTHROPLASTY Right 10/03/2016   Procedure: TOTAL HIP ARTHROPLASTY;  Surgeon: Marlena Sima, MD;  Location: [facility] OR;  Service: Orthopedics;  Laterality: Right;   TOTAL KNEE ARTHROPLASTY Right 11/09/2020   Procedure: TOTAL KNEE ARTHROPLASTY;  Surgeon: Marlena Sima, MD;  Location: [facility] ORS;  Service: Orthopedics;  Laterality: Right;   TOTAL KNEE REVISION Right 04/02/2022   Procedure: TOTAL KNEE REVISION;  Surgeon: Murleen Arms, MD;  Location: [facility] OR;  Service: Orthopedics;  Laterality: Right;   WISDOM TOOTH EXTRACTION     hx of    Family History  Problem Relation Age of Onset   Heart disease Mother    Colon cancer Brother    Prostate cancer Brother    Rectal cancer Neg Hx    Esophageal cancer Neg Hx    Social History:  reports that he quit smoking about 17 years ago. His smoking use included cigarettes. He started smoking about 57 years ago. He has a 10 pack-year smoking history. He has never used smokeless tobacco. He reports current drug use. He reports that he does not drink alcohol .  Allergies:  Allergies  Allergen Reactions   Aquacel Extra Hydrofiber [Wound Dressings] Other (See Comments)    Blisters to the skin   Brilinta  [Ticagrelor ] Shortness Of Breath and Other (See Comments)    CHEST PAIN    Methylprednisolone Other (See Comments)    DIAPHORESIS, HYPOTENSION   Prednisone  Other (See Comments)    DIAPHORESIS, HYPOTENSION   Toradol [Ketorolac Tromethamine] Other (See Comments)    DIAPHORESIS, HYPOTENSION     Adhesive [Tape] Rash and Other (See Comments)    "blisters" ( NO SURGICAL TAPE, PLEASE)    Medications Prior to Admission  Medication Sig Dispense Refill   acetaminophen  (TYLENOL ) 500 MG  tablet Take 500 mg by mouth daily as needed for moderate pain (pain score 4-6) or headache.     ascorbic acid (VITAMIN C) 500 MG tablet Take 500 mg by mouth daily.     aspirin  EC 81 MG tablet Take 81 mg by mouth daily. Swallow whole.     Aspirin  Effervescent (ALKA-SELTZER ORIGINAL PO) Take 1 packet by mouth daily as needed (gi upset).     atorvastatin  (LIPITOR ) 10 MG tablet Take 1 tablet (10 mg total) by mouth at bedtime. 90 tablet 1   bisacodyl  (DULCOLAX) 5 MG EC tablet Take by mouth as directed. (Patient taking differently: Take 5 mg by mouth daily.) 4 tablet 0   Buprenorphine  HCl (BELBUCA ) 900 MCG FILM Place 1 (  one) Film inside cheeks two times daily 60 each 0   carvedilol  (COREG ) 12.5 MG tablet Take 1 tablet (12.5 mg total) by mouth 2 (two) times daily with a meal. 180 tablet 3   chlorthalidone  (HYGROTON ) 25 MG tablet Take 1 tablet (25 mg total) by mouth daily. **must keep dr appointment in Oct 2024* (Patient taking differently: Take 25 mg by mouth daily.) 90 tablet 3   dapagliflozin  propanediol (FARXIGA ) 10 MG TABS tablet Take 1 tablet (10 mg total) by mouth daily. 90 tablet 0   diclofenac  Sodium (VOLTAREN ) 1 % GEL Apply small amount to the affected area 3 times a day as needed for pain (Patient taking differently: Apply 1 Application topically daily as needed (pain).) 100 g 2   diltiazem  (DILT-XR) 180 MG 24 hr capsule Take 1 capsule (180 mg total) by mouth daily. 90 capsule 3   docusate sodium  (COLACE) 100 MG capsule Take 1 capsule (100 mg total) by mouth 2 (two) times daily. (Patient taking differently: Take 100-200 mg by mouth See admin instructions. Take 200 mg by mouth in the morning and 100 mg by mouth at bedtime if needed for constipation.) 10 capsule 0   DULoxetine  (CYMBALTA ) 60 MG capsule Take 1 capsule (60 mg total) by mouth daily. 90 capsule 1   glipiZIDE  (GLUCOTROL  XL) 10 MG 24 hr tablet Take 2 tablets (20 mg total) by mouth daily with breakfast. (Patient taking differently: Take 10 mg  by mouth in the morning and at bedtime.) 90 tablet 1   linaclotide  (LINZESS ) 145 MCG CAPS capsule Take 1 capsule (145 mcg total) by mouth daily. 90 capsule 3   lisinopril  (ZESTRIL ) 20 MG tablet Take 1 tablet (20 mg total) by mouth daily. (Patient taking differently: Take 20 mg by mouth at bedtime.) 90 tablet 1   lubiprostone  (AMITIZA ) 24 MCG capsule Take 2 capsules (48 mcg total) by mouth at bedtime. 180 capsule 1   metFORMIN  (GLUCOPHAGE ) 1000 MG tablet Take 1 tablet (1,000 mg total) by mouth 2 (two) times daily. 180 tablet 1   methocarbamol  (ROBAXIN ) 750 MG tablet Take 1 tablet (750 mg total) by mouth 2 (two) times daily as needed. (Patient taking differently: Take 750 mg by mouth at bedtime.) 60 tablet 4   Multiple Vitamin (MULTIVITAMIN WITH MINERALS) TABS tablet Take 1 tablet by mouth daily.     naloxone  (NARCAN ) nasal spray 4 mg/0.1 mL Place 1 spray into the nose as needed. If found unresposive then spray this into nose and call 911 immediately 2 each 3   omeprazole  (PRILOSEC) 40 MG capsule Take 1 capsule (40 mg total) by mouth daily. 30 capsule 5   Oxycodone  HCl 20 MG TABS Take 1 tablet (20 mg total) by mouth every 6 (six) hours as needed. (Patient taking differently: Take 20 mg by mouth in the morning, at noon, in the evening, and at bedtime.) 120 tablet 0   polyethylene glycol powder (MIRALAX ) 17 GM/SCOOP powder Take as directed (Patient taking differently: Take 17 g by mouth daily as needed for mild constipation or moderate constipation.) 238 g 0   Potassium Chloride  ER 20 MEQ TBCR Take 1 tablet (20 mEq total) by mouth daily. 90 tablet 0   pregabalin  (LYRICA ) 150 MG capsule Take 1 capsule (150 mg total) by mouth 2 (two) times daily. 60 capsule 0   sildenafil  (VIAGRA ) 100 MG tablet Take 1 tablet (100 mg total) by mouth daily as needed. 10 tablet 0   atorvastatin  (LIPITOR ) 10 MG tablet Take 1  tablet (10 mg total) by mouth at bedtime. (Patient not taking: Reported on 06/02/2023) 90 tablet 1    diclofenac  Sodium (VOLTAREN ) 1 % GEL Apply a small amount to affected area 2 times a day as needed for pain 100 g 5   diltiazem  (DILT-XR) 180 MG 24 hr capsule Take 1 capsule (180 mg total) by mouth daily. 30 capsule 0   DULoxetine  (CYMBALTA ) 60 MG capsule Take 1 capsule (60 mg total) by mouth daily. 90 capsule 1   glucose blood ([employer] BLOOD GLUCOSE TEST) test strip Use to test blood sugar 4 times a day 120 strip 12   glucose blood ([employer] BLOOD GLUCOSE TEST) test strip Use 1 Strip four times daily, accu-chek aviva plus (Patient not taking: Reported on 06/02/2023) 120 strip 12   glucose blood test strip Use to test 4 times a day as directed (Patient not taking: Reported on 06/02/2023) 150 each 11   glucose blood test strip Use as directed 4 times daily 120 each 12   linaclotide  (LINZESS ) 145 MCG CAPS capsule Take 1 capsule (145 mcg total) by mouth daily. 90 capsule 3   lisinopril  (ZESTRIL ) 20 MG tablet Take 1 tablet (20 mg total) by mouth daily. (Patient not taking: Reported on 06/02/2023) 90 tablet 1   metFORMIN  (GLUCOPHAGE ) 1000 MG tablet Take 1 tablet (1,000 mg total) by mouth 2 (two) times daily. (Patient not taking: Reported on 06/02/2023) 180 tablet 1   mupirocin  ointment (BACTROBAN ) 2 % Apply to affected skin 2 times per day (Patient not taking: Reported on 07/28/2023) 22 g 0   naloxone  (NARCAN ) nasal spray 4 mg/0.1 mL Use 1 (one)  spray as needed, IF FOUND UNRESPONSIVE THEN SPRAY THIS INTO NOSE AND CALL 911 IMMEDIATELY 2 each 3   omeprazole  (PRILOSEC) 40 MG capsule Take 1 capsule (40 mg total) by mouth daily. 90 capsule 1   sildenafil  (VIAGRA ) 100 MG tablet Take 1 tablet (100 mg total) by mouth once as needed for up to 1 dose. 10 tablet 0   sildenafil  (VIAGRA ) 100 MG tablet Take 1 tablet (100 mg total) by mouth daily as needed. 30 tablet 0    Results for orders placed or performed during the hospital encounter of 07/28/23 (from the past 48 hours)  Basic metabolic panel per protocol     Status:  Abnormal   Collection Time: 07/27/23  9:48 AM  Result Value Ref Range   Sodium 135 135 - 145 mmol/L   Potassium 4.1 3.5 - 5.1 mmol/L   Chloride 100 98 - 111 mmol/L   CO2 24 22 - 32 mmol/L   Glucose, Bld 117 (H) 70 - 99 mg/dL    Comment: Glucose reference range applies only to samples taken after fasting for at least 8 hours.   BUN 17 8 - 23 mg/dL   Creatinine, Ser 1.61 0.61 - 1.24 mg/dL   Calcium  8.9 8.9 - 10.3 mg/dL   GFR, Estimated >09 >60 mL/min    Comment: (NOTE) Calculated using the CKD-EPI Creatinine Equation (2021)    Anion gap 11 5 - 15    Comment: Performed at [facility], [address] [place], [place] [address]  CBC per protocol     Status: Abnormal   Collection Time: 07/28/23 10:08 AM  Result Value Ref Range   WBC 7.8 4.0 - 10.5 K/uL   RBC 5.14 4.22 - 5.81 MIL/uL   Hemoglobin 12.9 (L) 13.0 - 17.0 g/dL   HCT 81.1 91.4 - 78.2 %  MCV 78.6 (L) 80.0 - 100.0 fL   MCH 25.1 (L) 26.0 - 34.0 pg   MCHC 31.9 30.0 - 36.0 g/dL   RDW 96.0 (H) 45.4 - 09.8 %   Platelets 233 150 - 400 K/uL   nRBC 0.0 0.0 - 0.2 %    Comment: Performed at [facility], [address] [place], [place] [address]  Glucose, capillary     Status: Abnormal   Collection Time: 07/28/23 10:16 AM  Result Value Ref Range   Glucose-Capillary 132 (H) 70 - 99 mg/dL    Comment: Glucose reference range applies only to samples taken after fasting for at least 8 hours.   *Note: Due to a large number of results and/or encounters for the requested time period, some results have not been displayed. A complete set of results can be found in Results Review.   No results found.  Review of Systems  All other systems reviewed and are negative.   Blood pressure 129/81, pulse 76, temperature 97.7 F (36.5 C), temperature source Oral, resp. rate 18, height 5\' 9"  (1.753 m), weight 108.9 kg, SpO2 95%. Physical Exam Constitutional:      Appearance: Normal appearance. He is normal weight.  HENT:     Head:  Normocephalic and atraumatic.     Right Ear: External ear normal.     Left Ear: External ear normal.     Nose: Nose normal.     Mouth/Throat:     Mouth: Mucous membranes are moist.     Pharynx: Oropharynx is clear.  Eyes:     Extraocular Movements: Extraocular movements intact.     Pupils: Pupils are equal, round, and reactive to light.  Cardiovascular:     Rate and Rhythm: Normal rate.  Pulmonary:     Effort: Pulmonary effort is normal.  Skin:    General: Skin is warm and dry.  Neurological:     General: No focal deficit present.     Mental Status: He is alert and oriented to person, place, and time.  Psychiatric:        Mood and Affect: Mood normal.        Behavior: Behavior normal.        Thought Content: Thought content normal.        Judgment: Judgment normal.      Assessment/Plan Laryngeal ulcer  To OR for direct laryngoscopy with biopsy for DIF testing.  Virgina Grills, MD 07/28/2023, 11:35 AM

## 2023-07-28 NOTE — Op Note (Signed)
 PREOPERATIVE DIAGNOSIS:  Throat pain and laryngeal ulcer   POSTOPERATIVE DIAGNOSIS: Throat pain and laryngeal ulcer   PROCEDURE:  Direct laryngoscopy with biopsy   SURGEON:  Virgina Grills, MD   ANESTHESIA:  General endotracheal anesthesia.   COMPLICATIONS:  None.   INDICATIONS:  The patient is a 68 year old male with a several month history of throat pain and has been found to have an ulceration of the epiglottis that has not improved with medicines.  Previous standard biopsy was inconclusive.  He presents to the operating room for surgical management.   FINDINGS:  Epiglottis mildly thickened with superficial ulceration/inflammation.  No clear mass.  Remainder of pharynx and larynx normal.   DESCRIPTION OF PROCEDURE:  The patient was identified in the holding room, informed consent having been obtained including discussion of risks, benefits and alternatives, the patient was brought to the operative suite and put the operative table in the supine position.  Anesthesia was induced and the patient was intubated by the Anesthesia team without difficulty.  The eyes were taped closed and bed was turned 90 degrees from anesthesia.  A damp gauze was placed over the upper gum and a Storz laryngoscope was used to evaluate the various areas of the pharynx and larynx.  The epiglottis was then visualized.  Three biopsies were then taken from the laryngeal surface of the epiglottis using cup forceps.  These were passed to nursing for pathology with request for DIF testing.  The laryngoscope was removed from the patient's mouth while suctioning the airway.  The patient was turned back to anesthesia for wakeup, was extubated, and taken to the recovery room in stable condition.

## 2023-07-28 NOTE — Telephone Encounter (Signed)
 Looks like appt was confirmed yesterday by Barabara Levering. @ 3:17 pm.   I will update all parties involved pt has in office appt 08/05/23 with Hao Meng, PAC

## 2023-07-28 NOTE — Anesthesia Procedure Notes (Signed)
 Procedure Name: Intubation Date/Time: 07/28/2023 12:12 PM  Performed by: Rafal Archuleta A, CRNAPre-anesthesia Checklist: Patient identified, Emergency Drugs available, Suction available and Patient being monitored Patient Re-evaluated:Patient Re-evaluated prior to induction Oxygen Delivery Method: Circle System Utilized Preoxygenation: Pre-oxygenation with 100% oxygen Induction Type: IV induction Ventilation: Mask ventilation without difficulty Laryngoscope Size: Mac and 4 (SRNA unable with Mil3) Grade View: Grade II Tube type: Oral Laser Tube: Cuffed inflated with minimal occlusive pressure - saline Tube size: 7.0 mm Number of attempts: 1 Airway Equipment and Method: Stylet and Oral airway Placement Confirmation: ETT inserted through vocal cords under direct vision, positive ETCO2 and breath sounds checked- equal and bilateral Secured at: 22 cm Tube secured with: Tape Dental Injury: Teeth and Oropharynx as per pre-operative assessment

## 2023-07-29 ENCOUNTER — Encounter (HOSPITAL_COMMUNITY): Payer: Self-pay | Admitting: Otolaryngology

## 2023-07-29 LAB — DERMATOLOGY PATHOLOGY

## 2023-08-05 ENCOUNTER — Other Ambulatory Visit (HOSPITAL_COMMUNITY): Payer: Self-pay

## 2023-08-05 ENCOUNTER — Ambulatory Visit: Admitting: Physician Assistant

## 2023-08-05 ENCOUNTER — Other Ambulatory Visit: Payer: Self-pay

## 2023-08-05 MED ORDER — BUPRENORPHINE HCL 2 MG SL SUBL
SUBLINGUAL_TABLET | SUBLINGUAL | 0 refills | Status: AC
  Filled 2023-08-05: qty 30, 30d supply, fill #0

## 2023-08-06 ENCOUNTER — Other Ambulatory Visit (HOSPITAL_COMMUNITY): Payer: Self-pay

## 2023-08-06 ENCOUNTER — Other Ambulatory Visit: Payer: Self-pay

## 2023-08-07 ENCOUNTER — Other Ambulatory Visit (HOSPITAL_COMMUNITY): Payer: Self-pay

## 2023-08-08 ENCOUNTER — Other Ambulatory Visit (HOSPITAL_COMMUNITY): Payer: Self-pay

## 2023-08-10 ENCOUNTER — Other Ambulatory Visit: Payer: Self-pay

## 2023-08-10 ENCOUNTER — Other Ambulatory Visit (HOSPITAL_COMMUNITY): Payer: Self-pay

## 2023-08-11 ENCOUNTER — Other Ambulatory Visit (HOSPITAL_COMMUNITY): Payer: Self-pay

## 2023-08-13 ENCOUNTER — Other Ambulatory Visit (HOSPITAL_COMMUNITY): Payer: Self-pay

## 2023-08-13 MED ORDER — BUPRENORPHINE HCL 2 MG SL SUBL
SUBLINGUAL_TABLET | Freq: Two times a day (BID) | SUBLINGUAL | 0 refills | Status: AC | PRN
  Filled 2023-08-13 – 2023-08-20 (×2): qty 30, 15d supply, fill #0

## 2023-08-13 MED ORDER — OXYCODONE HCL 20 MG PO TABS
20.0000 mg | ORAL_TABLET | Freq: Four times a day (QID) | ORAL | 0 refills | Status: AC | PRN
  Filled 2023-08-13 – 2023-08-24 (×3): qty 120, 30d supply, fill #0

## 2023-08-14 ENCOUNTER — Other Ambulatory Visit (HOSPITAL_COMMUNITY): Payer: Self-pay

## 2023-08-14 ENCOUNTER — Other Ambulatory Visit: Payer: Self-pay

## 2023-08-14 MED ORDER — PREGABALIN 150 MG PO CAPS
150.0000 mg | ORAL_CAPSULE | Freq: Two times a day (BID) | ORAL | 0 refills | Status: DC
  Filled 2023-08-14 – 2023-08-24 (×4): qty 60, 30d supply, fill #0

## 2023-08-15 ENCOUNTER — Other Ambulatory Visit (HOSPITAL_COMMUNITY): Payer: Self-pay

## 2023-08-17 ENCOUNTER — Other Ambulatory Visit: Payer: Self-pay

## 2023-08-17 ENCOUNTER — Other Ambulatory Visit (HOSPITAL_COMMUNITY): Payer: Self-pay

## 2023-08-19 ENCOUNTER — Other Ambulatory Visit (HOSPITAL_COMMUNITY): Payer: Self-pay

## 2023-08-20 ENCOUNTER — Other Ambulatory Visit (HOSPITAL_COMMUNITY): Payer: Self-pay

## 2023-08-24 ENCOUNTER — Other Ambulatory Visit (HOSPITAL_COMMUNITY): Payer: Self-pay

## 2023-08-24 ENCOUNTER — Other Ambulatory Visit: Payer: Self-pay

## 2023-08-25 ENCOUNTER — Other Ambulatory Visit (HOSPITAL_COMMUNITY): Payer: Self-pay

## 2023-08-25 MED ORDER — GLUCOSE BLOOD VI STRP
ORAL_STRIP | 12 refills | Status: AC
  Filled 2023-09-07: qty 350, 88d supply, fill #0
  Filled 2023-12-02: qty 350, 88d supply, fill #1
  Filled 2024-03-01: qty 350, 88d supply, fill #2

## 2023-08-25 MED ORDER — DULOXETINE HCL 60 MG PO CPEP
60.0000 mg | ORAL_CAPSULE | Freq: Every day | ORAL | 1 refills | Status: AC
  Filled 2023-08-25 – 2024-03-05 (×2): qty 90, 90d supply, fill #0

## 2023-08-25 MED ORDER — DILTIAZEM HCL ER 180 MG PO CP24
180.0000 mg | ORAL_CAPSULE | Freq: Every day | ORAL | 1 refills | Status: AC
  Filled 2023-08-25 – 2024-01-21 (×5): qty 90, 90d supply, fill #0

## 2023-08-25 MED ORDER — LINZESS 145 MCG PO CAPS
145.0000 ug | ORAL_CAPSULE | Freq: Every day | ORAL | 3 refills | Status: AC
  Filled 2023-08-25 – 2024-02-04 (×3): qty 90, 90d supply, fill #0

## 2023-08-25 MED ORDER — LISINOPRIL 20 MG PO TABS
20.0000 mg | ORAL_TABLET | Freq: Every day | ORAL | 1 refills | Status: AC
  Filled 2023-08-25 – 2024-02-04 (×4): qty 90, 90d supply, fill #0

## 2023-08-25 MED ORDER — GLIPIZIDE ER 10 MG PO TB24
10.0000 mg | ORAL_TABLET | Freq: Every day | ORAL | 1 refills | Status: DC
  Filled 2023-08-25: qty 90, 90d supply, fill #0
  Filled 2023-11-07 – 2023-11-26 (×2): qty 90, 90d supply, fill #1

## 2023-08-25 MED ORDER — DAPAGLIFLOZIN PROPANEDIOL 10 MG PO TABS
10.0000 mg | ORAL_TABLET | Freq: Every day | ORAL | 1 refills | Status: AC
  Filled 2023-08-25 – 2023-10-04 (×2): qty 90, 90d supply, fill #0

## 2023-08-25 MED ORDER — POTASSIUM CHLORIDE ER 20 MEQ PO TBCR
1.0000 | EXTENDED_RELEASE_TABLET | Freq: Every day | ORAL | 1 refills | Status: AC
  Filled 2023-08-25 – 2023-11-17 (×2): qty 90, 90d supply, fill #0
  Filled 2024-02-08: qty 90, 90d supply, fill #1

## 2023-08-25 MED ORDER — TADALAFIL 10 MG PO TABS
10.0000 mg | ORAL_TABLET | Freq: Every day | ORAL | 0 refills | Status: DC | PRN
  Filled 2023-08-25: qty 90, 90d supply, fill #0

## 2023-08-25 MED ORDER — OMEPRAZOLE 40 MG PO CPDR
40.0000 mg | DELAYED_RELEASE_CAPSULE | Freq: Every day | ORAL | 1 refills | Status: AC
  Filled 2023-08-25 – 2023-09-07 (×2): qty 90, 90d supply, fill #0
  Filled 2023-12-04: qty 90, 90d supply, fill #1

## 2023-08-25 MED ORDER — LUBIPROSTONE 24 MCG PO CAPS
24.0000 ug | ORAL_CAPSULE | Freq: Two times a day (BID) | ORAL | 1 refills | Status: DC
  Filled 2023-08-25 – 2023-11-07 (×7): qty 180, 90d supply, fill #0
  Filled 2024-02-04: qty 180, 90d supply, fill #1

## 2023-08-25 MED ORDER — METFORMIN HCL 1000 MG PO TABS
1000.0000 mg | ORAL_TABLET | Freq: Two times a day (BID) | ORAL | 1 refills | Status: AC
  Filled 2023-08-25 – 2023-11-07 (×2): qty 180, 90d supply, fill #0
  Filled 2024-02-04: qty 180, 90d supply, fill #1

## 2023-08-25 MED ORDER — ATORVASTATIN CALCIUM 10 MG PO TABS
10.0000 mg | ORAL_TABLET | Freq: Every day | ORAL | 1 refills | Status: DC
  Filled 2023-08-25 – 2023-12-28 (×7): qty 90, 90d supply, fill #0

## 2023-08-29 ENCOUNTER — Other Ambulatory Visit (HOSPITAL_COMMUNITY): Payer: Self-pay

## 2023-08-31 ENCOUNTER — Other Ambulatory Visit: Payer: Self-pay

## 2023-08-31 ENCOUNTER — Other Ambulatory Visit (HOSPITAL_COMMUNITY): Payer: Self-pay

## 2023-09-01 ENCOUNTER — Other Ambulatory Visit (HOSPITAL_COMMUNITY): Payer: Self-pay

## 2023-09-03 ENCOUNTER — Other Ambulatory Visit (HOSPITAL_COMMUNITY): Payer: Self-pay

## 2023-09-03 MED ORDER — BUPRENORPHINE HCL 8 MG SL SUBL
SUBLINGUAL_TABLET | SUBLINGUAL | 0 refills | Status: DC
  Filled 2023-09-03: qty 19, 19d supply, fill #0

## 2023-09-03 MED ORDER — BUPRENORPHINE HCL 8 MG SL SUBL
4.0000 mg | SUBLINGUAL_TABLET | Freq: Two times a day (BID) | SUBLINGUAL | 0 refills | Status: DC | PRN
  Filled 2023-09-03: qty 30, 30d supply, fill #0

## 2023-09-07 ENCOUNTER — Other Ambulatory Visit (HOSPITAL_COMMUNITY): Payer: Self-pay

## 2023-09-08 ENCOUNTER — Other Ambulatory Visit (HOSPITAL_COMMUNITY): Payer: Self-pay

## 2023-09-08 ENCOUNTER — Other Ambulatory Visit: Payer: Self-pay

## 2023-09-14 ENCOUNTER — Other Ambulatory Visit (HOSPITAL_COMMUNITY): Payer: Self-pay

## 2023-09-14 MED ORDER — BUPRENORPHINE HCL 8 MG SL SUBL
8.0000 mg | SUBLINGUAL_TABLET | Freq: Two times a day (BID) | SUBLINGUAL | 0 refills | Status: AC | PRN
  Filled ????-??-??: fill #0

## 2023-09-14 MED ORDER — BUPRENORPHINE HCL 8 MG SL SUBL
8.0000 mg | SUBLINGUAL_TABLET | Freq: Two times a day (BID) | SUBLINGUAL | 0 refills | Status: AC | PRN
  Filled 2023-09-14 – 2023-09-17 (×2): qty 60, 30d supply, fill #0

## 2023-09-15 ENCOUNTER — Other Ambulatory Visit (HOSPITAL_COMMUNITY): Payer: Self-pay

## 2023-09-15 MED ORDER — PULMICORT FLEXHALER 90 MCG/ACT IN AEPB
INHALATION_SPRAY | RESPIRATORY_TRACT | 3 refills | Status: DC
  Filled 2023-09-15: qty 1, 30d supply, fill #0

## 2023-09-15 MED ORDER — ARNUITY ELLIPTA 100 MCG/ACT IN AEPB
1.0000 | INHALATION_SPRAY | Freq: Every morning | RESPIRATORY_TRACT | 1 refills | Status: DC
  Filled 2023-09-15: qty 90, 90d supply, fill #0

## 2023-09-15 MED ORDER — OXYCODONE HCL 20 MG PO TABS
20.0000 mg | ORAL_TABLET | Freq: Four times a day (QID) | ORAL | 0 refills | Status: AC
Start: 2023-09-14 — End: ?
  Filled 2023-09-23 (×2): qty 120, 30d supply, fill #0

## 2023-09-15 MED ORDER — PREGABALIN 150 MG PO CAPS
150.0000 mg | ORAL_CAPSULE | Freq: Two times a day (BID) | ORAL | 0 refills | Status: DC
  Filled 2023-09-23 (×2): qty 60, 30d supply, fill #0

## 2023-09-17 ENCOUNTER — Other Ambulatory Visit (HOSPITAL_COMMUNITY): Payer: Self-pay

## 2023-09-18 ENCOUNTER — Other Ambulatory Visit (HOSPITAL_COMMUNITY): Payer: Self-pay

## 2023-09-22 ENCOUNTER — Other Ambulatory Visit (HOSPITAL_COMMUNITY): Payer: Self-pay

## 2023-09-22 ENCOUNTER — Other Ambulatory Visit: Payer: Self-pay

## 2023-09-23 ENCOUNTER — Other Ambulatory Visit: Payer: Self-pay

## 2023-09-23 ENCOUNTER — Other Ambulatory Visit (HOSPITAL_COMMUNITY): Payer: Self-pay

## 2023-09-28 ENCOUNTER — Other Ambulatory Visit: Payer: Self-pay

## 2023-09-29 ENCOUNTER — Other Ambulatory Visit (HOSPITAL_COMMUNITY): Payer: Self-pay

## 2023-09-29 ENCOUNTER — Other Ambulatory Visit: Payer: Self-pay

## 2023-09-29 MED ORDER — CLOTRIMAZOLE 10 MG MT TROC
10.0000 mg | OROMUCOSAL | 0 refills | Status: AC
  Filled 2023-09-29: qty 14, 98d supply, fill #0

## 2023-09-29 MED ORDER — TACROLIMUS 1 MG PO CAPS
ORAL_CAPSULE | ORAL | 3 refills | Status: DC
  Filled 2023-09-29: qty 180, 90d supply, fill #0

## 2023-09-29 MED ORDER — FLUCONAZOLE 150 MG PO TABS
150.0000 mg | ORAL_TABLET | ORAL | 0 refills | Status: DC
  Filled 2023-09-29: qty 6, 12d supply, fill #0

## 2023-10-05 ENCOUNTER — Other Ambulatory Visit: Payer: Self-pay

## 2023-10-05 ENCOUNTER — Other Ambulatory Visit (HOSPITAL_COMMUNITY): Payer: Self-pay

## 2023-10-12 ENCOUNTER — Other Ambulatory Visit (HOSPITAL_COMMUNITY): Payer: Self-pay

## 2023-10-13 ENCOUNTER — Other Ambulatory Visit (HOSPITAL_COMMUNITY): Payer: Self-pay

## 2023-10-13 MED ORDER — METHYLPREDNISOLONE 4 MG PO TABS
ORAL_TABLET | ORAL | 0 refills | Status: DC
  Filled 2023-10-13: qty 21, 6d supply, fill #0

## 2023-10-14 ENCOUNTER — Other Ambulatory Visit (HOSPITAL_COMMUNITY): Payer: Self-pay

## 2023-10-14 MED ORDER — OXYCODONE HCL 20 MG PO TABS
20.0000 mg | ORAL_TABLET | Freq: Four times a day (QID) | ORAL | 0 refills | Status: DC | PRN
  Filled 2023-10-23: qty 120, 30d supply, fill #0

## 2023-10-14 MED ORDER — PREGABALIN 150 MG PO CAPS
150.0000 mg | ORAL_CAPSULE | Freq: Two times a day (BID) | ORAL | 0 refills | Status: DC
  Filled 2023-10-23: qty 60, 30d supply, fill #0

## 2023-10-14 MED ORDER — BUPRENORPHINE HCL 8 MG SL SUBL
8.0000 mg | SUBLINGUAL_TABLET | Freq: Three times a day (TID) | SUBLINGUAL | 0 refills | Status: AC | PRN
  Filled 2023-10-17: qty 90, 30d supply, fill #0

## 2023-10-17 ENCOUNTER — Other Ambulatory Visit (HOSPITAL_COMMUNITY): Payer: Self-pay

## 2023-10-18 ENCOUNTER — Other Ambulatory Visit (HOSPITAL_COMMUNITY): Payer: Self-pay

## 2023-10-19 ENCOUNTER — Other Ambulatory Visit: Payer: Self-pay

## 2023-10-19 ENCOUNTER — Other Ambulatory Visit (HOSPITAL_COMMUNITY): Payer: Self-pay

## 2023-10-20 ENCOUNTER — Other Ambulatory Visit (HOSPITAL_COMMUNITY): Payer: Self-pay

## 2023-10-21 ENCOUNTER — Other Ambulatory Visit: Payer: Self-pay | Admitting: Orthopedic Surgery

## 2023-10-21 ENCOUNTER — Other Ambulatory Visit: Payer: Self-pay | Admitting: Interventional Radiology

## 2023-10-21 DIAGNOSIS — M25561 Pain in right knee: Secondary | ICD-10-CM

## 2023-10-22 ENCOUNTER — Other Ambulatory Visit (HOSPITAL_COMMUNITY): Payer: Self-pay

## 2023-10-23 ENCOUNTER — Other Ambulatory Visit: Payer: Self-pay

## 2023-10-23 ENCOUNTER — Other Ambulatory Visit (HOSPITAL_COMMUNITY): Payer: Self-pay

## 2023-10-28 ENCOUNTER — Other Ambulatory Visit (HOSPITAL_COMMUNITY): Payer: Self-pay

## 2023-10-30 ENCOUNTER — Other Ambulatory Visit (HOSPITAL_COMMUNITY): Payer: Self-pay

## 2023-10-30 NOTE — Progress Notes (Shared)
 Chief Complaint: Patient was seen in consultation today for right knee pain   Referring Physician(s): Marchwiany,Daniel A  History of Present Illness: Thomas Mcclure is a 68 y.o. male with a medical history significant for HTN, CAD s/p PCI, heart failure, RA, DM2, peripheral arterial disease, chronic pain (multiple back surgeries), left iliac artery aneurysm, obesity and right knee osteoarthritis s/p total knee arthroplasty in 2022 with revision in 2024.   Womac Pain Score = 47/96 VAS Pain Score = 8/10  The patient has been graciously referred to Interventional Radiology for consideration of right geniculate artery embolization.   Since his arthroplasty revision, he has had constant pain in the right knee.  His pain is exacerbated with ambulation and extremes of range of motion, particularly with flexion.  He still works around his house and yard, but his knee pain significantly limits this.  He has difficulty getting in and out of his truck.  Dressing and putting on socks/shoes is very difficult due to inability to bend the knee as much as he needs to.  He is very frustrated with how much he has suffered due to his right knee over the past few years.    Past Medical History:  Diagnosis Date   Baker's cyst    Left calf   CAD in native artery, 07/15/15 PCI of RCA with DES 07/16/2015   a.   NSTEMI 5/17: LHC - pLAD 20, pLCx 20, OM1 30, pRCA 100, EF 45-50%>> PCI: 2.5 x 24 mm Promus DES to RCA  //  b.   Echo 5/17: mild LVH, EF 50-55%, no RWMA, mod RVE //  c. LHC 6/17: pLAD 20, pLCx 20, OM1 50, pRCA stent ok, EF 35-45% with mod sized inf wall and basal segment aneurysm   Chronic back pain    down my back, down my legs (02/18/2017)   Chronic combined systolic and diastolic HF (heart failure) (HCC)    Constipation    Constipation due to opioid therapy    Fatty liver    GERD (gastroesophageal reflux disease)    04/07/2019- not current   Hyperlipidemia    Hypertension    Dr. Glo Blackwater  [phone]   Iliac artery aneurysm (HCC)    1.5 cm left IIA 09/15/21 CTA; follow-up in ~ 18 months per vascular surgeon   Ischemic cardiomyopathy    a. LV-gram at time of LHC in 6/17 with EF 35-45%  //  b. Echo 7/17: EF 45-50%, inferior HK, grade 1 diastolic dysfunction, mildly dilated aortic root, moderately reduced RVSF, mild RAE   Myocardial infarction (HCC) 2017   Neuromuscular disorder (HCC)    with nerve damage   Numbness and tingling of both lower extremities    on the outside of both sides (02/18/2017)   Peripheral arterial disease (HCC)    Rheumatoid arthritis (HCC)    RA   Tachycardia    Type II diabetes mellitus (HCC)    diet controlled    Past Surgical History:  Procedure Laterality Date   BACK SURGERY     x3   bilateral cataract curgery      CARDIAC CATHETERIZATION N/A 07/15/2015   Procedure: Left Heart Cath and Coronary Angiography;  Surgeon: Peter M Swaziland, MD;  Location: [facility] INVASIVE CV LAB;  Service: Cardiovascular;  Laterality: N/A;   CARDIAC CATHETERIZATION N/A 07/15/2015   Procedure: Coronary Stent Intervention;  Surgeon: Peter M Swaziland, MD;  Location: [facility] INVASIVE CV LAB;  Service: Cardiovascular;  Laterality: N/A;   CARDIAC CATHETERIZATION  N/A 08/07/2015   Procedure: Left Heart Cath and Coronary Angiography;  Surgeon: Victory LELON Sharps, MD;  Location: [facility] INVASIVE CV LAB;  Service: Cardiovascular;  Laterality: N/A;   CORONARY ANGIOPLASTY WITH STENT PLACEMENT  07/2015   DIRECT LARYNGOSCOPY N/A 03/23/2023   Procedure: MICRODIRECT LARYNGOSCOPY WITH BIOPSY;  Surgeon: Carlie Clark, MD;  Location: [facility];  Service: ENT;  Laterality: N/A;   DIRECT LARYNGOSCOPY Bilateral 07/28/2023   Procedure: LARYNGOSCOPY, DIRECT W/BIOPSY;  Surgeon: Carlie Clark, MD;  Location: [facility] OR;  Service: ENT;  Laterality: Bilateral;   LEFT HEART CATH AND CORONARY ANGIOGRAPHY N/A 02/19/2017   Procedure: LEFT HEART CATH AND CORONARY ANGIOGRAPHY;  Surgeon: Swaziland, Peter M, MD;   Location: [facility] INVASIVE CV LAB;  Service: Cardiovascular;  Laterality: N/A;   LUMBAR FUSION  04/11/2019   REVISION OF THORACOLUMBAR FUSION    LUMBAR SPINE SURGERY  03/2009  & 2012   MASS EXCISION N/A 04/19/2012   Procedure: removal of posterior cervical lipoma;  Surgeon: Reyes JONETTA Budge, MD;  Location: [facility];  Service: Neurosurgery;  Laterality: N/A;  Removal of posterior cervical lipoma   POPLITEAL SYNOVIAL CYST EXCISION Left    opened up behind my knee; scraped out arthritis   POSTERIOR LUMBAR FUSION 4 LEVEL N/A 11/22/2018   Procedure: Posterior Lateral and Interbody fusion - Lumbar one-Lumbar two; explore fusion, posterior instrumentation and fusion Thoracic ten to the ilium;  Surgeon: Budge Reyes, MD;  Location: [facility] OR;  Service: Neurosurgery;  Laterality: N/A;   SPINAL CORD STIMULATOR INSERTION N/A 01/07/2013   Procedure:  SPINAL CORD STIMULATOR INSERTION;  Surgeon: Deward JAYSON Fabian, MD;  Location: [facility];  Service: Neurosurgery;  Laterality: N/A;   SPINAL CORD STIMULATOR INSERTION N/A 02/09/2019   Procedure: REPLACEMENT OF LUMBAR SPINAL CORD STIMULATOR;  Surgeon: Budge Reyes, MD;  Location: [facility] OR;  Service: Neurosurgery;  Laterality: N/A;  Thoracic/Lumbar spine   TONSILLECTOMY     patient denies   TOTAL HIP ARTHROPLASTY Right 10/03/2016   Procedure: TOTAL HIP ARTHROPLASTY;  Surgeon: Shari Sieving, MD;  Location: [facility] OR;  Service: Orthopedics;  Laterality: Right;   TOTAL KNEE ARTHROPLASTY Right 11/09/2020   Procedure: TOTAL KNEE ARTHROPLASTY;  Surgeon: Shari Sieving, MD;  Location: [facility] ORS;  Service: Orthopedics;  Laterality: Right;   TOTAL KNEE REVISION Right 04/02/2022   Procedure: TOTAL KNEE REVISION;  Surgeon: Edna Sieving LABOR, MD;  Location: [facility] OR;  Service: Orthopedics;  Laterality: Right;   WISDOM TOOTH EXTRACTION     hx of    Allergies: Aquacel extra hydrofiber [wound dressings], Brilinta  [ticagrelor ], Methylprednisolone , Prednisone , Toradol [ketorolac  tromethamine], and Adhesive [tape]  Medications: Prior to Admission medications   Medication Sig Start Date End Date Taking? Authorizing Provider  acetaminophen  (TYLENOL ) 500 MG tablet Take 500 mg by mouth daily as needed for moderate pain (pain score 4-6) or headache.    [provider]  ascorbic acid (VITAMIN C) 500 MG tablet Take 500 mg by mouth daily.    [provider]  aspirin  EC 81 MG tablet Take 81 mg by mouth daily. Swallow whole.    [provider]  Aspirin  Effervescent (ALKA-SELTZER ORIGINAL PO) Take 1 packet by mouth daily as needed (gi upset).    [provider]  atorvastatin  (LIPITOR ) 10 MG tablet Take 1 tablet (10 mg total) by mouth at bedtime. 02/11/23     atorvastatin  (LIPITOR ) 10 MG tablet Take 1 tablet (10 mg total) by mouth at bedtime. Patient not taking: Reported on 06/02/2023  05/21/23     atorvastatin  (LIPITOR ) 10 MG tablet Take 1 tablet (10 mg total) by mouth at bedtime. 08/25/23     bisacodyl  (DULCOLAX) 5 MG EC tablet Take by mouth as directed. Patient taking differently: Take 5 mg by mouth daily. 03/26/23     buprenorphine  (SUBUTEX ) 2 MG SUBL SL tablet Place 1/2 tablet under tongue two times daily, as needed, THIS IS TO REPLACE THE BELBUCA  FOR MORE ANALGESIA 08/05/23     buprenorphine  (SUBUTEX ) 2 MG SUBL SL tablet 1 tablet under tongue two times daily, as needed, THIS IS TO REPLACE THE BELBUCA  FOR MORE ANALGESIA 08/13/23     buprenorphine  (SUBUTEX ) 8 MG SUBL SL tablet Take 1 tablet under tongue two times daily, as needed, THIS IS TO REPLACE THE BELBUCA  FOR MORE ANALGESIA 09/14/23     buprenorphine  (SUBUTEX ) 8 MG SUBL SL tablet Take 1 tablet under tongue two times daily, as needed, THIS IS TO REPLACE THE BELBUCA  FOR MORE ANALGESIA 09/14/23     buprenorphine  (SUBUTEX ) 8 MG SUBL SL tablet Place 1 tablet (8 mg total) under the tongue 3 (three) times daily as needed. 10/13/23     Buprenorphine  HCl (BELBUCA ) 900 MCG FILM  Place 1 (one) Film inside cheeks two times daily 07/15/23     carvedilol  (COREG ) 12.5 MG tablet Take 1 tablet (12.5 mg total) by mouth 2 (two) times daily with a meal. 12/08/22   Jeffrie Oneil BROCKS, MD  chlorthalidone  (HYGROTON ) 25 MG tablet Take 1 tablet (25 mg total) by mouth daily. **must keep dr appointment in Oct 2024* Patient taking differently: Take 25 mg by mouth daily. 12/08/22   Jeffrie Oneil BROCKS, MD  clotrimazole  (MYCELEX ) 10 MG troche Dissolve 1 troche in the oral cavity once weekly. 09/29/23     dapagliflozin  propanediol (FARXIGA ) 10 MG TABS tablet Take 1 tablet (10 mg total) by mouth daily. 08/25/23     diclofenac  Sodium (VOLTAREN ) 1 % GEL Apply small amount to the affected area 3 times a day as needed for pain Patient taking differently: Apply 1 Application topically daily as needed (pain). 12/16/22     diclofenac  Sodium (VOLTAREN ) 1 % GEL Apply a small amount to affected area 2 times a day as needed for pain 01/12/23     diltiazem  (DILT-XR) 180 MG 24 hr capsule Take 1 capsule (180 mg total) by mouth daily. 12/08/22   Jeffrie Oneil BROCKS, MD  diltiazem  (DILT-XR) 180 MG 24 hr capsule Take 1 capsule (180 mg total) by mouth daily. 05/21/23     diltiazem  (DILT-XR) 180 MG 24 hr capsule Take 1 capsule (180 mg total) by mouth daily. 08/25/23     docusate sodium  (COLACE) 100 MG capsule Take 1 capsule (100 mg total) by mouth 2 (two) times daily. Patient taking differently: Take 100-200 mg by mouth See admin instructions. Take 200 mg by mouth in the morning and 100 mg by mouth at bedtime if needed for constipation. 02/10/19   Bergman, Meghan D, NP  DULoxetine  (CYMBALTA ) 60 MG capsule Take 1 capsule (60 mg total) by mouth daily. 02/11/23     DULoxetine  (CYMBALTA ) 60 MG capsule Take 1 capsule (60 mg total) by mouth daily. 05/21/23     DULoxetine  (CYMBALTA ) 60 MG capsule Take 1 capsule (60 mg total) by mouth daily. 08/25/23     fluconazole  (DIFLUCAN ) 150 MG tablet Week 1: Take 1 tablet by mouth every other day. Weeks 2  through 4: Take 1 tablet by mouth once weekly  09/29/23     Fluticasone  Furoate (ARNUITY ELLIPTA ) 100 MCG/ACT AEPB Take 1 Inhalation by mouth every morning. 09/15/23     glipiZIDE  (GLUCOTROL  XL) 10 MG 24 hr tablet Take 2 tablets (20 mg total) by mouth daily with breakfast. Patient taking differently: Take 10 mg by mouth in the morning and at bedtime. 05/21/23     glipiZIDE  (GLUCOTROL  XL) 10 MG 24 hr tablet Take 1 tablet (10 mg total) by mouth daily with breakfast. 08/25/23     glucose blood ([employer] BLOOD GLUCOSE TEST) test strip Use to test blood sugar 4 times a day 01/28/23     glucose blood ([employer] BLOOD GLUCOSE TEST) test strip Use 1 Strip four times daily, accu-chek aviva plus Patient not taking: Reported on 06/02/2023 02/11/23     glucose blood test strip Use to test 4 times a day as directed Patient not taking: Reported on 06/02/2023 12/09/21     glucose blood test strip Use as directed 4 times daily 05/21/23     glucose blood test strip Use to test blood sugar 4 times a day 08/25/23     linaclotide  (LINZESS ) 145 MCG CAPS capsule Take 1 capsule (145 mcg total) by mouth daily. 11/12/22     linaclotide  (LINZESS ) 145 MCG CAPS capsule Take 1 capsule (145 mcg total) by mouth daily. 05/21/23     linaclotide  (LINZESS ) 145 MCG CAPS capsule Take 1 capsule (145 mcg total) by mouth daily. 08/25/23     lisinopril  (ZESTRIL ) 20 MG tablet Take 1 tablet (20 mg total) by mouth daily. Patient taking differently: Take 20 mg by mouth at bedtime. 02/11/23     lisinopril  (ZESTRIL ) 20 MG tablet Take 1 tablet (20 mg total) by mouth daily. Patient not taking: Reported on 06/02/2023 05/21/23     lisinopril  (ZESTRIL ) 20 MG tablet Take 1 tablet (20 mg total) by mouth daily. 08/25/23     lubiprostone  (AMITIZA ) 24 MCG capsule Take 1 capsule (24 mcg total) by mouth every 12 (twelve) hours. 08/25/23     metFORMIN  (GLUCOPHAGE ) 1000 MG tablet Take 1 tablet (1,000 mg total) by mouth 2 (two) times daily. 02/11/23     metFORMIN  (GLUCOPHAGE ) 1000 MG  tablet Take 1 tablet (1,000 mg total) by mouth 2 (two) times daily. Patient not taking: Reported on 06/02/2023 05/21/23     metFORMIN  (GLUCOPHAGE ) 1000 MG tablet Take 1 tablet (1,000 mg total) by mouth 2 (two) times daily. 08/25/23     methocarbamol  (ROBAXIN ) 750 MG tablet Take 1 tablet (750 mg total) by mouth 2 (two) times daily as needed. Patient taking differently: Take 750 mg by mouth at bedtime. 11/12/22     methylPREDNISolone  (MEDROL ) 4 MG tablet Take 6 tablets by mouth on day 1; Take 5 tablets by mouth on day 2; Take 4 tablets by mouth on day 3; Take 3 tablets by mouth on day 4; Take 2 tablets by mouth on day 6 and Take 1 tablet by mouth on day 6. 10/13/23     Multiple Vitamin (MULTIVITAMIN WITH MINERALS) TABS tablet Take 1 tablet by mouth daily.    [provider]  mupirocin  ointment (BACTROBAN ) 2 % Apply to affected area twice a day. 07/17/23   Jerrye Lamar CHRISTELLA Mickey., MD  naloxone  (NARCAN ) nasal spray 4 mg/0.1 mL Place 1 spray into the nose as needed. If found unresposive then spray this into nose and call 911 immediately 11/16/21     naloxone  (NARCAN ) nasal spray 4 mg/0.1 mL Use 1 (one)  spray as  needed, IF FOUND UNRESPONSIVE THEN SPRAY THIS INTO NOSE AND CALL 911 IMMEDIATELY 04/16/23     omeprazole  (PRILOSEC) 40 MG capsule Take 1 capsule (40 mg total) by mouth daily. 12/22/22     omeprazole  (PRILOSEC) 40 MG capsule Take 1 capsule (40 mg total) by mouth daily. 05/21/23     omeprazole  (PRILOSEC) 40 MG capsule Take 1 capsule (40 mg total) by mouth daily. 08/25/23     Oxycodone  HCl 20 MG TABS Take 1 (one) Tablet by mouth every six hours, as needed 08/13/23     Oxycodone  HCl 20 MG TABS Take 1 tablet (20 mg total) by mouth every 6 (six) hours as needed 09/14/23     Oxycodone  HCl 20 MG TABS Take 1 tablet (20 mg total) by mouth every 6 (six) hours as needed. 10/13/23     polyethylene glycol powder (MIRALAX ) 17 GM/SCOOP powder Take as directed Patient taking differently: Take 17 g by mouth daily as needed  for mild constipation or moderate constipation. 03/26/23     Potassium Chloride  ER 20 MEQ TBCR Take 1 tablet (20 mEq total) by mouth daily. 08/25/23     pregabalin  (LYRICA ) 150 MG capsule Take 1 capsule (150 mg total) by mouth 2 (two) times daily. 10/13/23     sildenafil  (VIAGRA ) 100 MG tablet Take 1 tablet (100 mg total) by mouth daily as needed. 05/07/21     sildenafil  (VIAGRA ) 100 MG tablet Take 1 tablet (100 mg total) by mouth once as needed for up to 1 dose. 07/17/23     sildenafil  (VIAGRA ) 100 MG tablet Take 1 tablet (100 mg total) by mouth daily as needed. 07/21/23   Jerome Heron Ruth, PA-C  tacrolimus  (PROGRAF ) 1 MG capsule Dissolve 1 capsule into a 0.5 liter bottle of water . Swish and spit for 2 minutes as directed. Use twice daily as needed during flares of oral lichen planus. Keep refrigerated and dispose after 2 weeks. 09/29/23     tadalafil  (CIALIS ) 10 MG tablet Take 1 tablet (10 mg total) by mouth daily approximately 30 minutes before sexual activity as needed for sexual activity. Do not take more than 1 tablet per day. 08/25/23     Budesonide  (PULMICORT  FLEXHALER) 90 MCG/ACT inhaler Inhale 1 puff in the morning and 1 puff before bedtime. 09/15/23 09/15/23       Family History  Problem Relation Age of Onset   Heart disease Mother    Colon cancer Brother    Prostate cancer Brother    Rectal cancer Neg Hx    Esophageal cancer Neg Hx     Social History   Socioeconomic History   Marital status: Married    Spouse name: Not on file   Number of children: 5   Years of education: Not on file   Highest education level: Not on file  Occupational History   Occupation: Psychologist, forensic, now on disability due to back pain  Tobacco Use   Smoking status: Former    Current packs/day: 0.00    Average packs/day: 0.3 packs/day for 40.0 years (10.0 ttl pk-yrs)    Types: Cigarettes    Start date: 83    Quit date: 2008    Years since quitting: 17.6   Smokeless tobacco: Never   Tobacco  comments:    02/18/2017 quit in 2012  Vaping Use   Vaping status: Never Used  Substance and Sexual Activity   Alcohol  use: No    Alcohol /week: 0.0 standard drinks of alcohol    Drug use:  Yes    Comment: chronic opioids for chronic back pain   Sexual activity: Not Currently  Other Topics Concern   Not on file  Social History Narrative   Not on file   Social Drivers of Health   Financial Resource Strain: Not on File (06/13/2021)   Received from [employer]    Financial Resource Strain: 0  Food Insecurity: Low Risk  (12/12/2022)   Received from [facility]   Hunger Vital Sign    Within the past 12 months, you worried that your food would run out before you got money to buy more: Never true    Within the past 12 months, the food you bought just didn't last and you didn't have money to get more. : Never true  Transportation Needs: No Transportation Needs (12/12/2022)   Received from [facility]    In the past 12 months, has lack of reliable transportation kept you from medical appointments, meetings, work or from getting things needed for daily living? : No  Physical Activity: Not on File (06/13/2021)   [facility] from [facility]   Physical Activity    Physical Activity: 0  Stress: Not on File (06/13/2021)   Received from [facility]   Stress    Stress: 0  Social Connections: Not on File (11/08/2022)   Received from [employer]    Connectedness: 0     Review of Systems: A 12 point ROS discussed and pertinent positives are indicated in the HPI above.  All other systems are negative.   Vital Signs: There were no vitals taken for this visit.  Advance Care Plan: The advanced care plan/surrogate decision maker was discussed at the time of visit and documented in the medical record.    Physical Exam Constitutional:      General: He is not in acute distress. HENT:     Head: Normocephalic.     Mouth/Throat:     Mouth: Mucous  membranes are moist.  Eyes:     General: No scleral icterus. Cardiovascular:     Rate and Rhythm: Normal rate and regular rhythm.  Pulmonary:     Effort: Pulmonary effort is normal.  Abdominal:     General: There is no distension.  Musculoskeletal:     Right lower leg: No edema.     Left lower leg: No edema.       Legs:     Comments: Tender to palpation.  Neurological:     Mental Status: He is alert.     Imaging:  Right Knee X-ray 04/02/22      11/02/23    Labs:  CBC: Recent Labs    07/28/23 1008  WBC 7.8  HGB 12.9*  HCT 40.4  PLT 233    COAGS: No results for input(s): INR, APTT in the last 8760 hours.  BMP: Recent Labs    03/20/23 1200 07/27/23 0948  NA 137 135  K 3.8 4.1  CL 99 100  CO2 28 24  GLUCOSE 107* 117*  BUN 13 17  CALCIUM  9.1 8.9  CREATININE 0.90 1.01  GFRNONAA >60 >60    LIVER FUNCTION TESTS: No results for input(s): BILITOT, AST, ALT, ALKPHOS, PROT, ALBUMIN  in the last 8760 hours.  TUMOR MARKERS: No results for input(s): AFPTM, CEA, CA199, CHROMGRNA in the last 8760 hours.  Assessment and Plan: 68 year old male with a history of right knee osteoarthritis status post total knee arthroplasty in 2022 with revision in  2024 with persistent pain and limited range of motion, refractory to conservative measures.  He would be a good candidate for consideration of geniculate artery embolization after arthroplasty for pain control.  We discussed the rationale, periprocedural expectations, and long term expected outcomes of geniculate artery embolization.  He would like to proceed.  Plan for right geniculate artery embolization via antegrade right femoral approach with moderate sedation at [facility].    Ester Sides, MD Pager: [phone]    I spent a total of  40 Minutes   in face to face in clinical consultation, greater than 50% of which was counseling/coordinating care for right knee pain.

## 2023-11-02 ENCOUNTER — Ambulatory Visit
Admission: RE | Admit: 2023-11-02 | Discharge: 2023-11-02 | Disposition: A | Discharge status: Home / Self Care | Source: Ambulatory Visit | Attending: Interventional Radiology

## 2023-11-02 ENCOUNTER — Other Ambulatory Visit (HOSPITAL_COMMUNITY): Payer: Self-pay

## 2023-11-02 ENCOUNTER — Ambulatory Visit
Admission: RE | Admit: 2023-11-02 | Discharge: 2023-11-02 | Disposition: A | Discharge status: Home / Self Care | Source: Ambulatory Visit | Attending: Orthopedic Surgery | Admitting: Orthopedic Surgery

## 2023-11-02 DIAGNOSIS — M25561 Pain in right knee: Secondary | ICD-10-CM

## 2023-11-02 HISTORY — PX: IR RADIOLOGIST EVAL & MGMT: IMG5224

## 2023-11-03 ENCOUNTER — Other Ambulatory Visit (HOSPITAL_COMMUNITY): Payer: Self-pay

## 2023-11-05 ENCOUNTER — Other Ambulatory Visit (HOSPITAL_COMMUNITY): Payer: Self-pay

## 2023-11-07 ENCOUNTER — Other Ambulatory Visit (HOSPITAL_COMMUNITY): Payer: Self-pay

## 2023-11-09 ENCOUNTER — Other Ambulatory Visit (HOSPITAL_COMMUNITY): Payer: Self-pay

## 2023-11-09 ENCOUNTER — Telehealth: Payer: Self-pay

## 2023-11-09 ENCOUNTER — Other Ambulatory Visit: Payer: Self-pay | Admitting: Interventional Radiology

## 2023-11-09 DIAGNOSIS — M1711 Unilateral primary osteoarthritis, right knee: Secondary | ICD-10-CM

## 2023-11-09 NOTE — Discharge Instructions (Signed)

## 2023-11-09 NOTE — Telephone Encounter (Signed)
 See telephone note

## 2023-11-09 NOTE — Telephone Encounter (Signed)
See telephone noted

## 2023-11-10 ENCOUNTER — Other Ambulatory Visit: Payer: Self-pay

## 2023-11-10 ENCOUNTER — Other Ambulatory Visit (HOSPITAL_COMMUNITY): Payer: Self-pay

## 2023-11-11 ENCOUNTER — Ambulatory Visit
Admission: RE | Admit: 2023-11-11 | Discharge: 2023-11-11 | Disposition: A | Discharge status: Home / Self Care | Source: Ambulatory Visit | Attending: Interventional Radiology | Admitting: Interventional Radiology

## 2023-11-11 DIAGNOSIS — M1711 Unilateral primary osteoarthritis, right knee: Secondary | ICD-10-CM

## 2023-11-11 HISTORY — PX: IR EMBO ARTERIAL NOT HEMORR HEMANG INC GUIDE ROADMAPPING: IMG5448

## 2023-11-11 MED ORDER — MIDAZOLAM HCL 2 MG/2ML IJ SOLN
INTRAMUSCULAR | Status: AC | PRN
  Administered 2023-11-11 (×2): 1 mg via INTRAVENOUS

## 2023-11-11 MED ORDER — ACETAMINOPHEN 10 MG/ML IV SOLN
1000.0000 mg | Freq: Once | INTRAVENOUS | Status: AC
  Administered 2023-11-11: 1000 mg via INTRAVENOUS

## 2023-11-11 MED ORDER — NITROGLYCERIN 1 MG/10 ML FOR IR/CATH LAB
100.0000 ug | INTRA_ARTERIAL | Status: DC | PRN
  Administered 2023-11-11: 100 ug via INTRA_ARTERIAL

## 2023-11-11 MED ORDER — IIOPAMIDOL (ISOVUE-250) INJECTION 51%
100.0000 mL | Freq: Once | INTRAVENOUS | Status: AC
Start: 2023-11-11 — End: 2023-11-11
  Administered 2023-11-11: 75 mL via INTRA_ARTERIAL

## 2023-11-11 MED ORDER — NITROGLYCERIN 1 MG/10 ML FOR IR/CATH LAB
INTRA_ARTERIAL | Status: AC | PRN
  Administered 2023-11-11: 100 ug via INTRA_ARTERIAL

## 2023-11-11 MED ORDER — FENTANYL CITRATE (PF) 100 MCG/2ML IJ SOLN
INTRAMUSCULAR | Status: AC | PRN
  Administered 2023-11-11 (×2): 50 ug via INTRAVENOUS

## 2023-11-11 MED ORDER — MIDAZOLAM HCL 2 MG/2ML IJ SOLN: 1.0000 mg | INTRAMUSCULAR | Status: DC | PRN

## 2023-11-11 MED ORDER — LIDOCAINE-EPINEPHRINE 1 %-1:100000 IJ SOLN
10.0000 mL | Freq: Once | INTRAMUSCULAR | Status: AC
  Administered 2023-11-11: 10 mL via INTRADERMAL

## 2023-11-11 MED ORDER — FENTANYL CITRATE PF 50 MCG/ML IJ SOSY: 25.0000 ug | PREFILLED_SYRINGE | INTRAMUSCULAR | Status: DC | PRN

## 2023-11-11 MED ORDER — SODIUM CHLORIDE 0.9 % IV SOLN: INTRAVENOUS | Status: DC

## 2023-11-11 NOTE — H&P (Signed)
 Chief Complaint: Right knee pain - IR consulted for right genicular artery embolization  Referring Provider(s): Marchwiany,Daniel A   Supervising Physician: Jennefer Rover  Patient Status: Thomas Mcclure - Outpt  History of Present Illness: Thomas Mcclure is a 68 y.o. Mcclure with pmhx HTN, CAD s/p PCI, CHF, RA, DM2, chronic Mcclure back pain, left iliac artery aneurysm, right knee osteoarthritis s/p total knee arthroplasty in 2022 and revision in 2024. Pt was initially referred to IR for consultation about right genicular artery embolization s/p total knee arthroplasty. Pt seen and evaluated with Dr. Jennefer in outpatient IR clinic on 11/02/23 with plan to proceed with right genicular artery embolization. Pt returning to IR clinic today for planned procedure.  Pt having baseline persistent right knee pain and lower back pain, and without additional complaint. Has been NPO. All questions answered.    Patient is Full Code  Past Medical History:  Diagnosis Date   Baker's cyst    Left calf   CAD in native artery, 07/15/15 PCI of RCA with DES 07/16/2015   a.   NSTEMI 5/17: LHC - pLAD 20, pLCx 20, OM1 30, pRCA 100, EF 45-50%>> PCI: 2.5 x 24 mm Promus DES to RCA  //  b.   Echo 5/17: mild LVH, EF 50-55%, no RWMA, mod RVE //  c. LHC 6/17: pLAD 20, pLCx 20, OM1 50, pRCA stent ok, EF 35-45% with mod sized inf wall and basal segment aneurysm   Chronic back pain    down my back, down my legs (02/18/2017)   Chronic combined systolic and diastolic HF (heart failure) (HCC)    Constipation    Constipation due to opioid therapy    Fatty liver    GERD (gastroesophageal reflux disease)    04/07/2019- not current   Hyperlipidemia    Hypertension    Dr. Glo Blackwater [phone]   Iliac artery aneurysm (HCC)    1.5 cm left IIA 09/15/21 CTA; follow-up in ~ 18 months per vascular surgeon   Ischemic cardiomyopathy    a. LV-gram at time of LHC in 6/17 with EF 35-45%  //  b. Echo 7/17: EF 45-50%, inferior HK,  grade 1 diastolic dysfunction, mildly dilated aortic root, moderately reduced RVSF, mild RAE   Myocardial infarction (HCC) 2017   Neuromuscular disorder (HCC)    with nerve damage   Numbness and tingling of both lower extremities    on the outside of both sides (02/18/2017)   Peripheral arterial disease (HCC)    Rheumatoid arthritis (HCC)    RA   Tachycardia    Type II diabetes mellitus (HCC)    diet controlled    Past Surgical History:  Procedure Laterality Date   BACK SURGERY     x3   bilateral cataract curgery      CARDIAC CATHETERIZATION N/A 07/15/2015   Procedure: Left Heart Cath and Coronary Angiography;  Surgeon: Peter M Swaziland, MD;  Location: [facility] INVASIVE CV LAB;  Service: Cardiovascular;  Laterality: N/A;   CARDIAC CATHETERIZATION N/A 07/15/2015   Procedure: Coronary Stent Intervention;  Surgeon: Peter M Swaziland, MD;  Location: [facility] INVASIVE CV LAB;  Service: Cardiovascular;  Laterality: N/A;   CARDIAC CATHETERIZATION N/A 08/07/2015   Procedure: Left Heart Cath and Coronary Angiography;  Surgeon: Victory LELON Sharps, MD;  Location: [facility] INVASIVE CV LAB;  Service: Cardiovascular;  Laterality: N/A;   CORONARY ANGIOPLASTY WITH STENT PLACEMENT  07/2015   DIRECT LARYNGOSCOPY N/A 03/23/2023   Procedure: MICRODIRECT LARYNGOSCOPY WITH BIOPSY;  Surgeon: Carlie Clark, MD;  Location: [facility];  Service: ENT;  Laterality: N/A;   DIRECT LARYNGOSCOPY Bilateral 07/28/2023   Procedure: LARYNGOSCOPY, DIRECT W/BIOPSY;  Surgeon: Carlie Clark, MD;  Location: [facility] OR;  Service: ENT;  Laterality: Bilateral;   IR RADIOLOGIST EVAL & MGMT  11/02/2023   LEFT HEART CATH AND CORONARY ANGIOGRAPHY N/A 02/19/2017   Procedure: LEFT HEART CATH AND CORONARY ANGIOGRAPHY;  Surgeon: Swaziland, Peter M, MD;  Location: [facility] INVASIVE CV LAB;  Service: Cardiovascular;  Laterality: N/A;   LUMBAR FUSION  04/11/2019   REVISION OF THORACOLUMBAR FUSION    LUMBAR SPINE SURGERY  03/2009  & 2012   MASS EXCISION N/A  04/19/2012   Procedure: removal of posterior cervical lipoma;  Surgeon: Reyes JONETTA Budge, MD;  Location: [facility];  Service: Neurosurgery;  Laterality: N/A;  Removal of posterior cervical lipoma   POPLITEAL SYNOVIAL CYST EXCISION Left    opened up behind my knee; scraped out arthritis   POSTERIOR LUMBAR FUSION 4 LEVEL N/A 11/22/2018   Procedure: Posterior Lateral and Interbody fusion - Lumbar one-Lumbar two; explore fusion, posterior instrumentation and fusion Thoracic ten to the ilium;  Surgeon: Budge Reyes, MD;  Location: [facility] OR;  Service: Neurosurgery;  Laterality: N/A;   SPINAL CORD STIMULATOR INSERTION N/A 01/07/2013   Procedure:  SPINAL CORD STIMULATOR INSERTION;  Surgeon: Deward JAYSON Fabian, MD;  Location: [facility];  Service: Neurosurgery;  Laterality: N/A;   SPINAL CORD STIMULATOR INSERTION N/A 02/09/2019   Procedure: REPLACEMENT OF LUMBAR SPINAL CORD STIMULATOR;  Surgeon: Budge Reyes, MD;  Location: [facility] OR;  Service: Neurosurgery;  Laterality: N/A;  Thoracic/Lumbar spine   TONSILLECTOMY     patient denies   TOTAL HIP ARTHROPLASTY Right 10/03/2016   Procedure: TOTAL HIP ARTHROPLASTY;  Surgeon: Shari Sieving, MD;  Location: [facility] OR;  Service: Orthopedics;  Laterality: Right;   TOTAL KNEE ARTHROPLASTY Right 11/09/2020   Procedure: TOTAL KNEE ARTHROPLASTY;  Surgeon: Shari Sieving, MD;  Location: [facility] ORS;  Service: Orthopedics;  Laterality: Right;   TOTAL KNEE REVISION Right 04/02/2022   Procedure: TOTAL KNEE REVISION;  Surgeon: Edna Sieving LABOR, MD;  Location: [facility] OR;  Service: Orthopedics;  Laterality: Right;   WISDOM TOOTH EXTRACTION     hx of    Allergies: Aquacel extra hydrofiber [wound dressings], Brilinta  [ticagrelor ], Methylprednisolone , Prednisone , Toradol [ketorolac tromethamine], and Adhesive [tape]  Medications: Prior to Admission medications   Medication Sig Start Date End Date Taking? Authorizing Provider  acetaminophen  (TYLENOL ) 500 MG tablet Take 500 mg  by mouth daily as needed for moderate pain (pain score 4-6) or headache.    [provider]  ascorbic acid (VITAMIN C) 500 MG tablet Take 500 mg by mouth daily.    [provider]  aspirin  EC 81 MG tablet Take 81 mg by mouth daily. Swallow whole.    [provider]  Aspirin  Effervescent (ALKA-SELTZER ORIGINAL PO) Take 1 packet by mouth daily as needed (gi upset).    [provider]  atorvastatin  (LIPITOR ) 10 MG tablet Take 1 tablet (10 mg total) by mouth at bedtime. 02/11/23     atorvastatin  (LIPITOR ) 10 MG tablet Take 1 tablet (10 mg total) by mouth at bedtime. Patient not taking: Reported on 06/02/2023 05/21/23     atorvastatin  (LIPITOR ) 10 MG tablet Take 1 tablet (10 mg total) by mouth at bedtime. 08/25/23     bisacodyl  (DULCOLAX) 5 MG EC tablet Take by mouth as directed. Patient taking differently: Take 5 mg by  mouth daily. 03/26/23     buprenorphine  (SUBUTEX ) 2 MG SUBL SL tablet Place 1/2 tablet under tongue two times daily, as needed, THIS IS TO REPLACE THE BELBUCA  FOR MORE ANALGESIA 08/05/23     buprenorphine  (SUBUTEX ) 2 MG SUBL SL tablet 1 tablet under tongue two times daily, as needed, THIS IS TO REPLACE THE BELBUCA  FOR MORE ANALGESIA 08/13/23     buprenorphine  (SUBUTEX ) 8 MG SUBL SL tablet Take 1 tablet under tongue two times daily, as needed, THIS IS TO REPLACE THE BELBUCA  FOR MORE ANALGESIA 09/14/23     buprenorphine  (SUBUTEX ) 8 MG SUBL SL tablet Take 1 tablet under tongue two times daily, as needed, THIS IS TO REPLACE THE BELBUCA  FOR MORE ANALGESIA 09/14/23     buprenorphine  (SUBUTEX ) 8 MG SUBL SL tablet Place 1 tablet (8 mg total) under the tongue 3 (three) times daily as needed. 10/13/23     Buprenorphine  HCl (BELBUCA ) 900 MCG FILM Place 1 (one) Film inside cheeks two times daily 07/15/23     carvedilol  (COREG ) 12.5 MG tablet Take 1 tablet (12.5 mg total) by mouth 2 (two) times daily with a meal. 12/08/22   Jeffrie Oneil BROCKS, MD   chlorthalidone  (HYGROTON ) 25 MG tablet Take 1 tablet (25 mg total) by mouth daily. **must keep dr appointment in Oct 2024* Patient taking differently: Take 25 mg by mouth daily. 12/08/22   Jeffrie Oneil BROCKS, MD  clotrimazole  (MYCELEX ) 10 MG troche Dissolve 1 troche in the oral cavity once weekly. 09/29/23     dapagliflozin  propanediol (FARXIGA ) 10 MG TABS tablet Take 1 tablet (10 mg total) by mouth daily. 08/25/23     diclofenac  Sodium (VOLTAREN ) 1 % GEL Apply small amount to the affected area 3 times a day as needed for pain Patient taking differently: Apply 1 Application topically daily as needed (pain). 12/16/22     diclofenac  Sodium (VOLTAREN ) 1 % GEL Apply a small amount to affected area 2 times a day as needed for pain 01/12/23     diltiazem  (DILT-XR) 180 MG 24 hr capsule Take 1 capsule (180 mg total) by mouth daily. 12/08/22   Jeffrie Oneil BROCKS, MD  diltiazem  (DILT-XR) 180 MG 24 hr capsule Take 1 capsule (180 mg total) by mouth daily. 05/21/23     diltiazem  (DILT-XR) 180 MG 24 hr capsule Take 1 capsule (180 mg total) by mouth daily. 08/25/23     docusate sodium  (COLACE) 100 MG capsule Take 1 capsule (100 mg total) by mouth 2 (two) times daily. Patient taking differently: Take 100-200 mg by mouth See admin instructions. Take 200 mg by mouth in the morning and 100 mg by mouth at bedtime if needed for constipation. 02/10/19   Bergman, Meghan D, NP  DULoxetine  (CYMBALTA ) 60 MG capsule Take 1 capsule (60 mg total) by mouth daily. 02/11/23     DULoxetine  (CYMBALTA ) 60 MG capsule Take 1 capsule (60 mg total) by mouth daily. 05/21/23     DULoxetine  (CYMBALTA ) 60 MG capsule Take 1 capsule (60 mg total) by mouth daily. 08/25/23     fluconazole  (DIFLUCAN ) 150 MG tablet Week 1: Take 1 tablet by mouth every other day. Weeks 2 through 4: Take 1 tablet by mouth once weekly 09/29/23     Fluticasone  Furoate (ARNUITY ELLIPTA ) 100 MCG/ACT AEPB Take 1 Inhalation by mouth every morning. 09/15/23     glipiZIDE  (GLUCOTROL  XL) 10 MG  24 hr tablet Take 2 tablets (20 mg total) by mouth daily with breakfast.  Patient taking differently: Take 10 mg by mouth in the morning and at bedtime. 05/21/23     glipiZIDE  (GLUCOTROL  XL) 10 MG 24 hr tablet Take 1 tablet (10 mg total) by mouth daily with breakfast. 08/25/23     glucose blood ([employer] BLOOD GLUCOSE TEST) test strip Use to test blood sugar 4 times a day 01/28/23     glucose blood ([employer] BLOOD GLUCOSE TEST) test strip Use 1 Strip four times daily, accu-chek aviva plus Patient not taking: Reported on 06/02/2023 02/11/23     glucose blood test strip Use to test 4 times a day as directed Patient not taking: Reported on 06/02/2023 12/09/21     glucose blood test strip Use as directed 4 times daily 05/21/23     glucose blood test strip Use to test blood sugar 4 times a day 08/25/23     linaclotide  (LINZESS ) 145 MCG CAPS capsule Take 1 capsule (145 mcg total) by mouth daily. 11/12/22     linaclotide  (LINZESS ) 145 MCG CAPS capsule Take 1 capsule (145 mcg total) by mouth daily. 05/21/23     linaclotide  (LINZESS ) 145 MCG CAPS capsule Take 1 capsule (145 mcg total) by mouth daily. 08/25/23     lisinopril  (ZESTRIL ) 20 MG tablet Take 1 tablet (20 mg total) by mouth daily. Patient taking differently: Take 20 mg by mouth at bedtime. 02/11/23     lisinopril  (ZESTRIL ) 20 MG tablet Take 1 tablet (20 mg total) by mouth daily. Patient not taking: Reported on 06/02/2023 05/21/23     lisinopril  (ZESTRIL ) 20 MG tablet Take 1 tablet (20 mg total) by mouth daily. 08/25/23     lubiprostone  (AMITIZA ) 24 MCG capsule Take 1 capsule (24 mcg total) by mouth every 12 (twelve) hours. 08/25/23     metFORMIN  (GLUCOPHAGE ) 1000 MG tablet Take 1 tablet (1,000 mg total) by mouth 2 (two) times daily. 02/11/23     metFORMIN  (GLUCOPHAGE ) 1000 MG tablet Take 1 tablet (1,000 mg total) by mouth 2 (two) times daily. Patient not taking: Reported on 06/02/2023 05/21/23     metFORMIN  (GLUCOPHAGE ) 1000 MG tablet Take 1 tablet (1,000 mg total) by mouth  2 (two) times daily. 08/25/23     methocarbamol  (ROBAXIN ) 750 MG tablet Take 1 tablet (750 mg total) by mouth 2 (two) times daily as needed. Patient taking differently: Take 750 mg by mouth at bedtime. 11/12/22     methylPREDNISolone  (MEDROL ) 4 MG tablet Take 6 tablets by mouth on day 1; Take 5 tablets by mouth on day 2; Take 4 tablets by mouth on day 3; Take 3 tablets by mouth on day 4; Take 2 tablets by mouth on day 6 and Take 1 tablet by mouth on day 6. 10/13/23     Multiple Vitamin (MULTIVITAMIN WITH MINERALS) TABS tablet Take 1 tablet by mouth daily.    [provider]  mupirocin  ointment (BACTROBAN ) 2 % Apply to affected area twice a day. 07/17/23   Jerrye Lamar CHRISTELLA Mickey., MD  naloxone  (NARCAN ) nasal spray 4 mg/0.1 mL Place 1 spray into the nose as needed. If found unresposive then spray this into nose and call 911 immediately 11/16/21     naloxone  (NARCAN ) nasal spray 4 mg/0.1 mL Use 1 (one)  spray as needed, IF FOUND UNRESPONSIVE THEN SPRAY THIS INTO NOSE AND CALL 911 IMMEDIATELY 04/16/23     omeprazole  (PRILOSEC) 40 MG capsule Take 1 capsule (40 mg total) by mouth daily. 12/22/22     omeprazole  (PRILOSEC) 40 MG capsule Take  1 capsule (40 mg total) by mouth daily. 05/21/23     omeprazole  (PRILOSEC) 40 MG capsule Take 1 capsule (40 mg total) by mouth daily. 08/25/23     Oxycodone  HCl 20 MG TABS Take 1 (one) Tablet by mouth every six hours, as needed 08/13/23     Oxycodone  HCl 20 MG TABS Take 1 tablet (20 mg total) by mouth every 6 (six) hours as needed 09/14/23     Oxycodone  HCl 20 MG TABS Take 1 tablet (20 mg total) by mouth every 6 (six) hours as needed. 10/13/23     polyethylene glycol powder (MIRALAX ) 17 GM/SCOOP powder Take as directed Patient taking differently: Take 17 g by mouth daily as needed for mild constipation or moderate constipation. 03/26/23     Potassium Chloride  ER 20 MEQ TBCR Take 1 tablet (20 mEq total) by mouth daily. 08/25/23     pregabalin  (LYRICA ) 150 MG capsule Take 1 capsule  (150 mg total) by mouth 2 (two) times daily. 10/13/23     sildenafil  (VIAGRA ) 100 MG tablet Take 1 tablet (100 mg total) by mouth daily as needed. 05/07/21     sildenafil  (VIAGRA ) 100 MG tablet Take 1 tablet (100 mg total) by mouth once as needed for up to 1 dose. 07/17/23     sildenafil  (VIAGRA ) 100 MG tablet Take 1 tablet (100 mg total) by mouth daily as needed. 07/21/23   Jerome Heron Ruth, PA-C  tacrolimus  (PROGRAF ) 1 MG capsule Dissolve 1 capsule into a 0.5 liter bottle of water . Swish and spit for 2 minutes as directed. Use twice daily as needed during flares of oral lichen planus. Keep refrigerated and dispose after 2 weeks. 09/29/23     tadalafil  (CIALIS ) 10 MG tablet Take 1 tablet (10 mg total) by mouth daily approximately 30 minutes before sexual activity as needed for sexual activity. Do not take more than 1 tablet per day. 08/25/23     Budesonide  (PULMICORT  FLEXHALER) 90 MCG/ACT inhaler Inhale 1 puff in the morning and 1 puff before bedtime. 09/15/23 09/15/23       Family History  Problem Relation Age of Onset   Heart disease Mother    Colon cancer Brother    Prostate cancer Brother    Rectal cancer Neg Hx    Esophageal cancer Neg Hx     Social History   Socioeconomic History   Marital status: Married    Spouse name: Not on file   Number of children: 5   Years of education: Not on file   Highest education level: Not on file  Occupational History   Occupation: Psychologist, forensic, now on disability due to back pain  Tobacco Use   Smoking status: Former    Current packs/day: 0.00    Average packs/day: 0.3 packs/day for 40.0 years (10.0 ttl pk-yrs)    Types: Cigarettes    Start date: 19    Quit date: 2008    Years since quitting: 17.7   Smokeless tobacco: Never   Tobacco comments:    02/18/2017 quit in 2012  Vaping Use   Vaping status: Never Used  Substance and Sexual Activity   Alcohol  use: No    Alcohol /week: 0.0 standard drinks of alcohol    Drug use: Yes     Comment: chronic opioids for chronic back pain   Sexual activity: Not Currently  Other Topics Concern   Not on file  Social History Narrative   Not on file   Social Drivers of Health  Financial Resource Strain: Not on File (06/13/2021)   Received from [employer] Resource Strain: 0  Food Insecurity: Mc[place] Risk  (12/12/2022)   Received from [facility]   Hunger Vital Sign    Within the past 12 months, you worried that your food would run out before you got money to buy more: Never true    Within the past 12 months, the food you bought just didn't last and you didn't have money to get more. : Never true  Transportation Needs: No Transportation Needs (12/12/2022)   Received from [facility]    In the past 12 months, has lack of reliable transportation kept you from medical appointments, meetings, work or from getting things needed for daily living? : No  Physical Activity: Not on File (06/13/2021)   [facility] from [facility]   Physical Activity    Physical Activity: 0  Stress: Not on File (06/13/2021)   Received from [facility]   Stress    Stress: 0  Social Connections: Not on File (11/08/2022)   Received from [employer]    Connectedness: 0     Review of Systems: A 12 point ROS discussed and pertinent positives are indicated in the HPI above.  All other systems are negative.  Review of Systems  Musculoskeletal:  Positive for arthralgias (right knee - chronic) and back pain (Mcclure back - chronic).    Vital Signs: There were no vitals taken for this visit.  Advance Mc[place] Plan: No documents on file  Physical Exam Vitals and nursing note reviewed.  Constitutional:      General: He is not in acute distress. HENT:     Mouth/Throat:     Mouth: Mucous membranes are moist.     Pharynx: Oropharynx is clear.  Cardiovascular:     Rate and Rhythm: Normal rate and regular rhythm.  Pulmonary:     Effort: Pulmonary effort is  normal.     Breath sounds: Normal breath sounds.  Abdominal:     Palpations: Abdomen is soft.     Tenderness: There is no abdominal tenderness.  Musculoskeletal:     Right lower leg: No edema.     Left lower leg: No edema.     Comments: + ttp of inferolateral aspect of right knee, moderate right knee swelling  Skin:    General: Skin is warm and dry.  Neurological:     Mental Status: He is alert and oriented to person, place, and time. Mental status is at baseline.     Imaging: DG Knee 1-2 Views Right Result Date: 11/06/2023 EXAM: 1 or 2 VIEW(S) XRAY OF THE RIGHT KNEE 11/02/2023 01:14:09 PM COMPARISON: 04/02/2022. CLINICAL HISTORY: Knee xray then see Dr Jennefer FINDINGS: BONES AND JOINTS: No acute fracture. No focal osseous lesion. No joint dislocation. No significant joint effusion. Total knee arthroplasty in place. Lucency around the medial tibial plateau component of total knee arthroplasty. SOFT TISSUES: Prepatellar soft tissue edema. Vascular calcification. IMPRESSION: 1. No acute fracture or dislocation. 2. Total knee arthroplasty in place with lucency around the medial tibial plateau component. 3. Prepatellar soft tissue edema. Electronically signed by: Waddell Calk MD 11/06/2023 04:30 PM EDT RP Workstation: [id number]   IR Radiologist Eval & Mgmt Result Date: 11/02/2023 EXAM: NEW PATIENT OFFICE VISIT CHIEF COMPLAINT: See Epic note. HISTORY OF PRESENT ILLNESS: See Epic note. REVIEW OF SYSTEMS: See Epic note. PHYSICAL EXAMINATION: See Epic note. ASSESSMENT AND PLAN: See  Epic note. Ester Sides, MD [facility] Electronically Signed   By: Ester Sides M.D.   On: 11/02/2023 13:57    Labs:  CBC: Recent Labs    07/28/23 1008  WBC 7.8  HGB 12.9*  HCT 40.4  PLT 233    COAGS: No results for input(s): INR, APTT in the last 8760 hours.  BMP: Recent Labs    03/20/23 1200 07/27/23 0948  NA 137 135  K 3.8 4.1  CL 99  100  CO2 28 24  GLUCOSE 107* 117*  BUN 13 17  CALCIUM  9.1 8.9  CREATININE 0.90 1.01  GFRNONAA >60 >60    LIVER FUNCTION TESTS: No results for input(s): BILITOT, AST, ALT, ALKPHOS, PROT, ALBUMIN  in the last 8760 hours.  TUMOR MARKERS: No results for input(s): AFPTM, CEA, CA199, CHROMGRNA in the last 8760 hours.  Assessment and Plan:  Thomas Mcclure is a 68 y.o. Mcclure with pmhx HTN, CAD s/p PCI, CHF, RA, DM2, chronic pain, left iliac artery aneurysm, right knee osteoarthritis s/p total knee arthroplasty in 2022 and revision in 2024. Pt was initially referred to IR for consultation about right genicular artery embolization s/p total knee arthroplasty. Pt seen and evaluated with Dr. Sides in outpatient IR clinic on 11/02/23 with plan to proceed with right genicular artery embolization. Pt returning to IR clinic today for planned procedure.  The Risks and benefits of right genicular artery embolization were discussed with the patient including, but not limited to bleeding, infection, vascular injury, post operative pain, or contrast induced renal failure.  This procedure involves the use of X-rays and because of the nature of the planned procedure, it is possible that we will have prolonged use of X-ray fluoroscopy.  Potential radiation risks to you include (but are not limited to) the following: - A slightly elevated risk for cancer several years later in life. This risk is typically less than 0.5% percent. This risk is [place] in comparison to the normal incidence of human cancer, which is 33% for women and 50% for men according to the American [employer] Society. - Radiation induced injury can include skin redness, resembling a rash, tissue breakdown / ulcers and hair loss (which can be temporary or permanent).   The likelihood of either of these occurring depends on the difficulty of the procedure and whether you are sensitive to radiation due to previous procedures, disease,  or genetic conditions.   IF your procedure requires a prolonged use of radiation, you will be notified and given written instructions for further action.  It is your responsibility to monitor the irradiated area for the 2 weeks following the procedure and to notify your physician if you are concerned that you have suffered a radiation induced injury.    All of the patient's questions were answered, patient is agreeable to proceed. Consent signed and in chart.   Thank you for allowing our service to participate in Thomas Mcclure.  Electronically Signed: Kimble VEAR Clas, PA-C   11/11/2023, 9:24 AM      I spent a total of  30 Minutes   in face to face in clinical consultation, greater than 50% of which was counseling/coordinating Mcclure for right genicular artery embolization

## 2023-11-13 ENCOUNTER — Other Ambulatory Visit (HOSPITAL_COMMUNITY): Payer: Self-pay

## 2023-11-13 MED ORDER — OXYCODONE HCL 20 MG PO TABS
20.0000 mg | ORAL_TABLET | Freq: Four times a day (QID) | ORAL | 0 refills | Status: DC | PRN
  Filled 2023-11-13 – 2023-11-22 (×2): qty 120, 30d supply, fill #0

## 2023-11-13 MED ORDER — BUPRENORPHINE HCL 8 MG SL SUBL
8.0000 mg | SUBLINGUAL_TABLET | Freq: Three times a day (TID) | SUBLINGUAL | 0 refills | Status: DC | PRN
  Filled 2023-11-13 – 2023-11-16 (×3): qty 90, 30d supply, fill #0

## 2023-11-13 MED ORDER — PREGABALIN 150 MG PO CAPS
150.0000 mg | ORAL_CAPSULE | Freq: Two times a day (BID) | ORAL | 0 refills | Status: DC
  Filled 2023-11-13 – 2023-11-26 (×2): qty 60, 30d supply, fill #0

## 2023-11-14 ENCOUNTER — Other Ambulatory Visit (HOSPITAL_COMMUNITY): Payer: Self-pay

## 2023-11-15 ENCOUNTER — Other Ambulatory Visit (HOSPITAL_COMMUNITY): Payer: Self-pay

## 2023-11-16 ENCOUNTER — Other Ambulatory Visit (HOSPITAL_COMMUNITY): Payer: Self-pay

## 2023-11-17 ENCOUNTER — Other Ambulatory Visit (HOSPITAL_COMMUNITY): Payer: Self-pay

## 2023-11-18 ENCOUNTER — Other Ambulatory Visit (HOSPITAL_COMMUNITY): Payer: Self-pay

## 2023-11-19 ENCOUNTER — Other Ambulatory Visit (HOSPITAL_COMMUNITY): Payer: Self-pay

## 2023-11-21 ENCOUNTER — Other Ambulatory Visit (HOSPITAL_COMMUNITY): Payer: Self-pay

## 2023-11-22 ENCOUNTER — Other Ambulatory Visit (HOSPITAL_COMMUNITY): Payer: Self-pay

## 2023-11-23 ENCOUNTER — Other Ambulatory Visit (HOSPITAL_COMMUNITY): Payer: Self-pay

## 2023-11-23 MED ORDER — DILTIAZEM HCL ER BEADS 180 MG PO CP24
180.0000 mg | ORAL_CAPSULE | Freq: Every day | ORAL | 0 refills | Status: AC
  Filled 2023-11-23: qty 30, 30d supply, fill #0

## 2023-11-24 ENCOUNTER — Other Ambulatory Visit: Payer: Self-pay | Admitting: Orthopedic Surgery

## 2023-11-24 ENCOUNTER — Other Ambulatory Visit (HOSPITAL_COMMUNITY): Payer: Self-pay

## 2023-11-24 ENCOUNTER — Encounter (HOSPITAL_COMMUNITY): Payer: Self-pay

## 2023-11-24 ENCOUNTER — Ambulatory Visit
Admission: RE | Admit: 2023-11-24 | Discharge: 2023-11-24 | Disposition: A | Discharge status: Home / Self Care | Source: Ambulatory Visit | Attending: Orthopedic Surgery | Admitting: Orthopedic Surgery

## 2023-11-24 DIAGNOSIS — S52021A Displaced fracture of olecranon process without intraarticular extension of right ulna, initial encounter for closed fracture: Secondary | ICD-10-CM

## 2023-11-27 ENCOUNTER — Other Ambulatory Visit (HOSPITAL_COMMUNITY): Payer: Self-pay

## 2023-11-27 ENCOUNTER — Other Ambulatory Visit: Payer: Self-pay

## 2023-11-30 ENCOUNTER — Other Ambulatory Visit: Payer: Self-pay | Admitting: Interventional Radiology

## 2023-11-30 ENCOUNTER — Telehealth (HOSPITAL_BASED_OUTPATIENT_CLINIC_OR_DEPARTMENT_OTHER): Payer: Self-pay | Admitting: *Deleted

## 2023-11-30 DIAGNOSIS — M25561 Pain in right knee: Secondary | ICD-10-CM

## 2023-11-30 NOTE — Telephone Encounter (Signed)
   Pre-operative Risk Assessment    Patient Name: Thomas Mcclure  DOB: 11/29/1955 MRN: [id number]   Date of last office visit: 12/08/22 DR. SKAINS Date of next office visit: 01/06/24 DR. SKAINS 1 YR F/U APPT    Request for Surgical Clearance    Procedure:  RIGHT OLECRANON BURSECTOMY   Date of Surgery:  Clearance TBD                                Surgeon:  DR. EVALENE NEEDLE  Surgeon's Group or Practice Name:  [facility] Phone number:  [phone] EXT [address] Fax number:  [phone]   Type of Clearance Requested:   - Medical  - Pharmacy:  Hold Aspirin      Type of Anesthesia:  CHOICE   Additional requests/questions:    Bonney Niels Jest   11/30/2023, 10:17 AM

## 2023-11-30 NOTE — Telephone Encounter (Signed)
 I will update all parties involved. Pt has appt 01/06/24 Dr. Jeffrie.

## 2023-11-30 NOTE — Telephone Encounter (Signed)
   Name: Thomas Mcclure  DOB: September 12, 1955  MRN: [id number]  Primary Cardiologist: Oneil Parchment, MD  Chart reviewed as part of pre-operative protocol coverage. Because of Thomas Mcclure's past medical history and time since last visit, he will require a follow-up in-office visit in order to better assess preoperative cardiovascular risk.  Patient has an office visit scheduled on 01/06/2024 with Dr. Parchment. Appointment notes have been updated to reflect need for pre-op evaluation.   Pre-op covering staff:  - Please contact requesting surgeon's office via preferred method (i.e, phone, fax) to inform them of need for appointment prior to surgery.   Barnie Hila, NP  11/30/2023, 12:00 PM

## 2023-12-01 ENCOUNTER — Other Ambulatory Visit (HOSPITAL_COMMUNITY): Payer: Self-pay

## 2023-12-02 ENCOUNTER — Other Ambulatory Visit (HOSPITAL_COMMUNITY): Payer: Self-pay

## 2023-12-02 ENCOUNTER — Other Ambulatory Visit: Payer: Self-pay

## 2023-12-03 ENCOUNTER — Other Ambulatory Visit (HOSPITAL_COMMUNITY): Payer: Self-pay

## 2023-12-07 ENCOUNTER — Other Ambulatory Visit (HOSPITAL_COMMUNITY): Payer: Self-pay

## 2023-12-07 ENCOUNTER — Other Ambulatory Visit: Payer: Self-pay

## 2023-12-12 ENCOUNTER — Other Ambulatory Visit (HOSPITAL_COMMUNITY): Payer: Self-pay

## 2023-12-13 ENCOUNTER — Other Ambulatory Visit: Payer: Self-pay | Admitting: Cardiology

## 2023-12-13 ENCOUNTER — Other Ambulatory Visit (HOSPITAL_COMMUNITY): Payer: Self-pay

## 2023-12-14 ENCOUNTER — Other Ambulatory Visit (HOSPITAL_COMMUNITY): Payer: Self-pay

## 2023-12-14 MED ORDER — DILTIAZEM HCL ER 180 MG PO CP24
180.0000 mg | ORAL_CAPSULE | Freq: Every day | ORAL | 0 refills | Status: AC
  Filled 2023-12-14: qty 30, 30d supply, fill #0

## 2023-12-14 MED ORDER — PREGABALIN 150 MG PO CAPS
150.0000 mg | ORAL_CAPSULE | Freq: Two times a day (BID) | ORAL | 0 refills | Status: DC
  Filled 2023-12-28: qty 60, 30d supply, fill #0

## 2023-12-14 MED ORDER — OXYCODONE HCL 20 MG PO TABS
20.0000 mg | ORAL_TABLET | Freq: Four times a day (QID) | ORAL | 0 refills | Status: DC | PRN
  Filled 2023-12-14 – 2023-12-22 (×2): qty 120, 30d supply, fill #0

## 2023-12-15 ENCOUNTER — Other Ambulatory Visit (HOSPITAL_COMMUNITY): Payer: Self-pay

## 2023-12-15 MED ORDER — BUPRENORPHINE HCL 8 MG SL SUBL
8.0000 mg | SUBLINGUAL_TABLET | Freq: Three times a day (TID) | SUBLINGUAL | 0 refills | Status: AC | PRN
  Filled 2023-12-15: qty 90, 30d supply, fill #0
  Filled 2023-12-16: qty 75, 25d supply, fill #0
  Filled 2023-12-16: qty 15, 5d supply, fill #0

## 2023-12-16 ENCOUNTER — Other Ambulatory Visit (HOSPITAL_COMMUNITY): Payer: Self-pay

## 2023-12-16 ENCOUNTER — Other Ambulatory Visit: Payer: Self-pay

## 2023-12-17 ENCOUNTER — Other Ambulatory Visit (HOSPITAL_COMMUNITY): Payer: Self-pay

## 2023-12-17 MED ORDER — METFORMIN HCL 1000 MG PO TABS
1000.0000 mg | ORAL_TABLET | Freq: Two times a day (BID) | ORAL | 1 refills | Status: AC
  Filled 2023-12-17: qty 180, 90d supply, fill #0

## 2023-12-17 MED ORDER — DULOXETINE HCL 60 MG PO CPEP
60.0000 mg | ORAL_CAPSULE | Freq: Every day | ORAL | 1 refills | Status: AC
  Filled 2023-12-17 – 2024-02-08 (×8): qty 90, 90d supply, fill #0

## 2023-12-17 MED ORDER — GLIPIZIDE ER 10 MG PO TB24
10.0000 mg | ORAL_TABLET | Freq: Two times a day (BID) | ORAL | 1 refills | Status: AC
  Filled 2023-12-17: qty 90, 90d supply, fill #0
  Filled 2023-12-21 – 2024-02-08 (×6): qty 90, 45d supply, fill #0
  Filled 2024-03-30: qty 90, 45d supply, fill #1

## 2023-12-17 MED ORDER — LINZESS 145 MCG PO CAPS
145.0000 ug | ORAL_CAPSULE | Freq: Every day | ORAL | 3 refills | Status: AC
  Filled 2023-12-17: qty 90, 90d supply, fill #0

## 2023-12-17 MED ORDER — POTASSIUM CHLORIDE ER 20 MEQ PO TBCR
1.0000 | EXTENDED_RELEASE_TABLET | Freq: Every day | ORAL | 1 refills | Status: AC
  Filled 2023-12-17: qty 90, 90d supply, fill #0

## 2023-12-17 MED ORDER — LISINOPRIL 20 MG PO TABS
20.0000 mg | ORAL_TABLET | Freq: Every day | ORAL | 1 refills | Status: AC
  Filled 2023-12-17: qty 90, 90d supply, fill #0

## 2023-12-17 MED ORDER — TADALAFIL 20 MG PO TABS
20.0000 mg | ORAL_TABLET | Freq: Every day | ORAL | 0 refills | Status: DC
  Filled 2023-12-17: qty 30, 30d supply, fill #0

## 2023-12-17 MED ORDER — DILTIAZEM HCL ER 180 MG PO CP24
180.0000 mg | ORAL_CAPSULE | Freq: Every day | ORAL | 1 refills | Status: AC
  Filled 2023-12-17 – 2024-01-18 (×2): qty 90, 90d supply, fill #0

## 2023-12-17 MED ORDER — ATORVASTATIN CALCIUM 10 MG PO TABS
10.0000 mg | ORAL_TABLET | Freq: Every day | ORAL | 1 refills | Status: AC
  Filled 2023-12-17 – 2024-03-30 (×2): qty 90, 90d supply, fill #0

## 2023-12-17 MED ORDER — OMEPRAZOLE 40 MG PO CPDR
40.0000 mg | DELAYED_RELEASE_CAPSULE | Freq: Every day | ORAL | 1 refills | Status: AC
  Filled 2023-12-17 – 2024-03-05 (×2): qty 90, 90d supply, fill #0

## 2023-12-17 MED ORDER — DAPAGLIFLOZIN PROPANEDIOL 10 MG PO TABS
10.0000 mg | ORAL_TABLET | Freq: Every day | ORAL | 1 refills | Status: AC
  Filled 2023-12-17: qty 90, 90d supply, fill #0
  Filled 2024-03-30: qty 90, 90d supply, fill #1

## 2023-12-17 MED ORDER — GLUCOSE BLOOD VI STRP
1.0000 | ORAL_STRIP | Freq: Four times a day (QID) | 12 refills | Status: AC
  Filled 2023-12-17 – 2024-01-14 (×2): qty 300, 75d supply, fill #0
  Filled 2024-01-18: qty 350, 88d supply, fill #0
  Filled 2024-01-21: qty 400, 100d supply, fill #0

## 2023-12-18 ENCOUNTER — Other Ambulatory Visit (HOSPITAL_COMMUNITY): Payer: Self-pay

## 2023-12-21 ENCOUNTER — Other Ambulatory Visit (HOSPITAL_COMMUNITY): Payer: Self-pay

## 2023-12-22 ENCOUNTER — Other Ambulatory Visit (HOSPITAL_COMMUNITY): Payer: Self-pay

## 2023-12-29 ENCOUNTER — Other Ambulatory Visit (HOSPITAL_COMMUNITY): Payer: Self-pay

## 2023-12-31 ENCOUNTER — Other Ambulatory Visit: Payer: Self-pay

## 2024-01-06 ENCOUNTER — Encounter: Payer: Self-pay | Admitting: Cardiology

## 2024-01-06 ENCOUNTER — Ambulatory Visit: Attending: Student in an Organized Health Care Education/Training Program | Admitting: Cardiology

## 2024-01-06 VITALS — BP 100/72 | HR 83 | Ht 69.0 in | Wt 248.0 lb

## 2024-01-06 DIAGNOSIS — I10 Essential (primary) hypertension: Secondary | ICD-10-CM

## 2024-01-06 DIAGNOSIS — Z01810 Encounter for preprocedural cardiovascular examination: Secondary | ICD-10-CM

## 2024-01-06 DIAGNOSIS — I2510 Atherosclerotic heart disease of native coronary artery without angina pectoris: Secondary | ICD-10-CM

## 2024-01-06 DIAGNOSIS — I5042 Chronic combined systolic (congestive) and diastolic (congestive) heart failure: Secondary | ICD-10-CM | POA: Diagnosis not present

## 2024-01-06 NOTE — Progress Notes (Signed)
 Cardiology Office Note:  .   Date:  01/06/2024  ID:  Thomas Mcclure, DOB December 23, 1955, MRN [id number] PCP: Jerome Heron Ruth, PA-C  [facility] Providers Cardiologist:  Oneil Parchment, MD     History of Present Illness: .   Thomas Mcclure is a 68 y.o. male Discussed the use of AI scribe  History of Present Illness Thomas Mcclure is a 68 year old male with coronary artery disease who presents for cardiovascular evaluation prior to elbow surgery.  He has a history of coronary artery disease with a prior inferior wall myocardial infarction. A cardiac catheterization on February 19, 2017, revealed a patent stent in the right coronary artery and nonobstructive coronary artery disease. A nuclear stress test in 2020 showed no evidence of ischemia. No current cardiac symptoms, including chest pain, shortness of breath, fainting, or bleeding. He is able to walk around the block and climb stairs without experiencing chest pain and remains active, engaging in daily activities such as yard work.  He is experiencing significant pain in his elbow, which began after an incident last year when he slipped and hit his elbow on the tailgate of his truck, causing swelling. The elbow was drained once, but the swelling and pain have recurred. X-rays were taken last year, but he is unsure of the results.      Studies Reviewed: SABRA   EKG Interpretation Date/Time:  Wednesday January 06 2024 15:31:22 EST Ventricular Rate:  83 PR Interval:  176 QRS Duration:  106 QT Interval:  392 QTC Calculation: 460 R Axis:   29  Text Interpretation: Normal sinus rhythm Inferior infarct (cited on or before 08-Dec-2022) When compared with ECG of 08-Dec-2022 10:11, Nonspecific T wave abnormality has replaced inverted T waves in Inferior leads Confirmed by Parchment Oneil ([id number]) on 01/06/2024 3:35:23 PM    Results DIAGNOSTIC Nuclear stress test: 42% prior inferior wall infarction, no evidence of ischemia Cardiac  catheterization: Patent stent in RCA, nonobstructive coronary artery disease (02/19/2017) Risk Assessment/Calculations:            Physical Exam:   VS:  BP 100/72   Pulse 83   Ht 5' 9 (1.753 m)   Wt 248 lb (112.5 kg)   BMI 36.62 kg/m    Wt Readings from Last 3 Encounters:  01/06/24 248 lb (112.5 kg)  11/11/23 244 lb (110.7 kg)  07/28/23 240 lb (108.9 kg)    GEN: Well nourished, well developed in no acute distress NECK: No JVD; No carotid bruits CARDIAC: RRR, no murmurs, no rubs, no gallops RESPIRATORY:  Clear to auscultation without rales, wheezing or rhonchi  ABDOMEN: Soft, non-tender, non-distended EXTREMITIES:  No edema; right elbow bursa swollen  ASSESSMENT AND PLAN: .    Assessment and Plan Assessment & Plan Preoperative cardiovascular risk assessment for elective surgery-right elbow Cardiovascular status is well-managed with no new cardiac symptoms. He is asymptomatic with no chest pain, dyspnea, syncope, or bleeding. He maintains an active lifestyle without limitations. Previous cardiac catheterization showed patent stent in the RCA and nonobstructive CAD. Recent nuclear stress test showed no ischemia. Surgery is considered low risk. - Proceed with elective surgery as planned. - No additional cardiac testing required.  Able to complete greater than 4 METS of activity without any anginal symptoms.  Coronary artery disease, status post prior inferior wall myocardial infarction and stent placement Coronary artery disease is well-managed with no current symptoms. He is asymptomatic and maintains an active lifestyle. Previous cardiac catheterization showed patent stent  in the RCA and nonobstructive CAD. Recent nuclear stress test showed no ischemia.         Dispo: 1 yr APP  Signed, Oneil Parchment, MD

## 2024-01-06 NOTE — Patient Instructions (Signed)
 Medication Instructions:  The current medical regimen is effective;  continue present plan and medications.  *If you need a refill on your cardiac medications before your next appointment, please call your pharmacy*  OK for surgery.  Follow-Up: At [facility], you and your health needs are our priority.  As part of our continuing mission to provide you with exceptional heart care, our providers are all part of one team.  This team includes your primary Cardiologist (physician) and Advanced Practice Providers or APPs (Physician Assistants and Nurse Practitioners) who all work together to provide you with the care you need, when you need it.  Your next appointment:   1 year(s)  Provider:   One of our Advanced Practice Providers (APPs): Morse Clause, PA-C  Lamarr Satterfield, NP Miriam Shams, NP  Olivia Pavy, PA-C Josefa Beauvais, NP  Leontine Salen, PA-C Orren Fabry, PA-C  [place], PA-C Ernest Dick, NP  Damien Braver, NP Jon Hails, PA-C  Waddell Donath, PA-C    Dayna Dunn, PA-C  Scott Weaver, PA-C Lum Louis, NP Katlyn West, NP Callie Goodrich, PA-C  Xika Zhao, NP Sheng Haley, PA-C    Kathleen Johnson, PA-C       We recommend signing up for the patient portal called MyChart.  Sign up information is provided on this After Visit Summary.  MyChart is used to connect with patients for Virtual Visits (Telemedicine).  Patients are able to view lab/test results, encounter notes, upcoming appointments, etc.  Non-urgent messages can be sent to your provider as well.   To learn more about what you can do with MyChart, go to [email or web address].

## 2024-01-12 ENCOUNTER — Other Ambulatory Visit (HOSPITAL_COMMUNITY): Payer: Self-pay

## 2024-01-14 ENCOUNTER — Other Ambulatory Visit (HOSPITAL_COMMUNITY): Payer: Self-pay

## 2024-01-14 ENCOUNTER — Other Ambulatory Visit: Payer: Self-pay

## 2024-01-14 MED ORDER — BUPRENORPHINE HCL 8 MG SL SUBL
8.0000 mg | SUBLINGUAL_TABLET | Freq: Three times a day (TID) | SUBLINGUAL | 0 refills | Status: AC | PRN
  Filled 2024-01-14 – 2024-01-15 (×3): qty 90, 30d supply, fill #0

## 2024-01-14 MED ORDER — PREGABALIN 150 MG PO CAPS
150.0000 mg | ORAL_CAPSULE | Freq: Two times a day (BID) | ORAL | 0 refills | Status: DC
  Filled 2024-01-14 – 2024-01-31 (×3): qty 60, 30d supply, fill #0

## 2024-01-14 MED ORDER — OXYCODONE HCL 20 MG PO TABS
20.0000 mg | ORAL_TABLET | Freq: Four times a day (QID) | ORAL | 0 refills | Status: AC | PRN
Start: 2024-01-13 — End: ?
  Filled 2024-01-14 – 2024-01-19 (×3): qty 120, 30d supply, fill #0

## 2024-01-15 ENCOUNTER — Other Ambulatory Visit (HOSPITAL_COMMUNITY): Payer: Self-pay

## 2024-01-15 ENCOUNTER — Other Ambulatory Visit: Payer: Self-pay

## 2024-01-15 NOTE — Telephone Encounter (Signed)
 Wife is asking that we sent the clearance to the office for patient's surgery. Please advise

## 2024-01-15 NOTE — Telephone Encounter (Signed)
 Preop clearance appt notes routed to [facility]. Fax #:  [phone]

## 2024-01-18 ENCOUNTER — Other Ambulatory Visit: Payer: Self-pay

## 2024-01-18 ENCOUNTER — Other Ambulatory Visit (HOSPITAL_COMMUNITY): Payer: Self-pay

## 2024-01-19 ENCOUNTER — Other Ambulatory Visit (HOSPITAL_COMMUNITY): Payer: Self-pay

## 2024-01-21 ENCOUNTER — Other Ambulatory Visit (HOSPITAL_COMMUNITY): Payer: Self-pay

## 2024-01-22 ENCOUNTER — Other Ambulatory Visit (HOSPITAL_COMMUNITY): Payer: Self-pay

## 2024-01-22 ENCOUNTER — Other Ambulatory Visit: Payer: Self-pay

## 2024-01-30 ENCOUNTER — Other Ambulatory Visit (HOSPITAL_COMMUNITY): Payer: Self-pay

## 2024-01-31 ENCOUNTER — Other Ambulatory Visit (HOSPITAL_COMMUNITY): Payer: Self-pay

## 2024-02-04 ENCOUNTER — Other Ambulatory Visit: Payer: Self-pay | Admitting: Cardiology

## 2024-02-04 ENCOUNTER — Other Ambulatory Visit (HOSPITAL_COMMUNITY): Payer: Self-pay

## 2024-02-05 ENCOUNTER — Other Ambulatory Visit (HOSPITAL_COMMUNITY): Payer: Self-pay

## 2024-02-05 ENCOUNTER — Other Ambulatory Visit: Payer: Self-pay

## 2024-02-05 MED ORDER — CARVEDILOL 12.5 MG PO TABS
12.5000 mg | ORAL_TABLET | Freq: Two times a day (BID) | ORAL | 3 refills | Status: AC
  Filled 2024-02-05: qty 180, 90d supply, fill #0

## 2024-02-05 MED ORDER — CHLORTHALIDONE 25 MG PO TABS
25.0000 mg | ORAL_TABLET | Freq: Every day | ORAL | 3 refills | Status: AC
  Filled 2024-02-05: qty 90, 90d supply, fill #0

## 2024-02-05 MED ORDER — DICLOFENAC SODIUM 1 % EX GEL
CUTANEOUS | 5 refills | Status: AC
  Filled 2024-02-05: qty 100, 14d supply, fill #0

## 2024-02-06 NOTE — H&P (Signed)
 PREOPERATIVE H&P  Chief Complaint: RIGHT ELBOW LOOSE BODY, OLECRANON BURSITIS  HPI: Thomas Mcclure is a 68 y.o. male who presents with a diagnosis of RIGHT ELBOW LOOSE BODY, OLECRANON BURSITIS. Symptoms are rated as moderate to severe, and have been worsening.  This is significantly impairing activities of daily living.  He has elected for surgical management.   Past Medical History:  Diagnosis Date   Baker's cyst    Left calf   CAD in native artery, 07/15/15 PCI of RCA with DES 07/16/2015   a.   NSTEMI 5/17: LHC - pLAD 20, pLCx 20, OM1 30, pRCA 100, EF 45-50%>> PCI: 2.5 x 24 mm Promus DES to RCA  //  b.   Echo 5/17: mild LVH, EF 50-55%, no RWMA, mod RVE //  c. LHC 6/17: pLAD 20, pLCx 20, OM1 50, pRCA stent ok, EF 35-45% with mod sized inf wall and basal segment aneurysm   Chronic back pain    down my back, down my legs (02/18/2017)   Chronic combined systolic and diastolic HF (heart failure) (HCC)    Constipation    Constipation due to opioid therapy    Fatty liver    GERD (gastroesophageal reflux disease)    04/07/2019- not current   Hyperlipidemia    Hypertension    Dr. Glo Blackwater [phone]   Iliac artery aneurysm    1.5 cm left IIA 09/15/21 CTA; follow-up in ~ 18 months per vascular surgeon   Ischemic cardiomyopathy    a. LV-gram at time of LHC in 6/17 with EF 35-45%  //  b. Echo 7/17: EF 45-50%, inferior HK, grade 1 diastolic dysfunction, mildly dilated aortic root, moderately reduced RVSF, mild RAE   Myocardial infarction (HCC) 2017   Neuromuscular disorder (HCC)    with nerve damage   Numbness and tingling of both lower extremities    on the outside of both sides (02/18/2017)   Peripheral arterial disease    Rheumatoid arthritis (HCC)    RA   Tachycardia    Type II diabetes mellitus (HCC)    diet controlled   Past Surgical History:  Procedure Laterality Date   BACK SURGERY     x3   bilateral cataract curgery      CARDIAC CATHETERIZATION N/A 07/15/2015    Procedure: Left Heart Cath and Coronary Angiography;  Surgeon: Peter M Jordan, MD;  Location: [facility] INVASIVE CV LAB;  Service: Cardiovascular;  Laterality: N/A;   CARDIAC CATHETERIZATION N/A 07/15/2015   Procedure: Coronary Stent Intervention;  Surgeon: Peter M Jordan, MD;  Location: [facility] INVASIVE CV LAB;  Service: Cardiovascular;  Laterality: N/A;   CARDIAC CATHETERIZATION N/A 08/07/2015   Procedure: Left Heart Cath and Coronary Angiography;  Surgeon: Victory LELON Sharps, MD;  Location: [facility] INVASIVE CV LAB;  Service: Cardiovascular;  Laterality: N/A;   CORONARY ANGIOPLASTY WITH STENT PLACEMENT  07/2015   DIRECT LARYNGOSCOPY N/A 03/23/2023   Procedure: MICRODIRECT LARYNGOSCOPY WITH BIOPSY;  Surgeon: Carlie Clark, MD;  Location: [facility];  Service: ENT;  Laterality: N/A;   DIRECT LARYNGOSCOPY Bilateral 07/28/2023   Procedure: LARYNGOSCOPY, DIRECT W/BIOPSY;  Surgeon: Carlie Clark, MD;  Location: [facility] OR;  Service: ENT;  Laterality: Bilateral;   IR EMBO ARTERIAL NOT HEMORR HEMANG INC GUIDE ROADMAPPING  11/11/2023   IR RADIOLOGIST EVAL & MGMT  11/02/2023   LEFT HEART CATH AND CORONARY ANGIOGRAPHY N/A 02/19/2017   Procedure: LEFT HEART CATH AND CORONARY ANGIOGRAPHY;  Surgeon: Jordan, Peter M, MD;  Location: [facility] INVASIVE CV  LAB;  Service: Cardiovascular;  Laterality: N/A;   LUMBAR FUSION  04/11/2019   REVISION OF THORACOLUMBAR FUSION    LUMBAR SPINE SURGERY  03/2009  & 2012   MASS EXCISION N/A 04/19/2012   Procedure: removal of posterior cervical lipoma;  Surgeon: Reyes JONETTA Budge, MD;  Location: [facility] NEURO ORS;  Service: Neurosurgery;  Laterality: N/A;  Removal of posterior cervical lipoma   POPLITEAL SYNOVIAL CYST EXCISION Left    opened up behind my knee; scraped out arthritis   POSTERIOR LUMBAR FUSION 4 LEVEL N/A 11/22/2018   Procedure: Posterior Lateral and Interbody fusion - Lumbar one-Lumbar two; explore fusion, posterior instrumentation and fusion Thoracic ten to the ilium;  Surgeon: Budge Reyes, MD;  Location: [facility] OR;  Service: Neurosurgery;  Laterality: N/A;   SPINAL CORD STIMULATOR INSERTION N/A 01/07/2013   Procedure:  SPINAL CORD STIMULATOR INSERTION;  Surgeon: Deward JAYSON Fabian, MD;  Location: [facility] NEURO ORS;  Service: Neurosurgery;  Laterality: N/A;   SPINAL CORD STIMULATOR INSERTION N/A 02/09/2019   Procedure: REPLACEMENT OF LUMBAR SPINAL CORD STIMULATOR;  Surgeon: Budge Reyes, MD;  Location: [facility] OR;  Service: Neurosurgery;  Laterality: N/A;  Thoracic/Lumbar spine   TONSILLECTOMY     patient denies   TOTAL HIP ARTHROPLASTY Right 10/03/2016   Procedure: TOTAL HIP ARTHROPLASTY;  Surgeon: Shari Sieving, MD;  Location: [facility] OR;  Service: Orthopedics;  Laterality: Right;   TOTAL KNEE ARTHROPLASTY Right 11/09/2020   Procedure: TOTAL KNEE ARTHROPLASTY;  Surgeon: Shari Sieving, MD;  Location: [facility] ORS;  Service: Orthopedics;  Laterality: Right;   TOTAL KNEE REVISION Right 04/02/2022   Procedure: TOTAL KNEE REVISION;  Surgeon: Edna Sieving LABOR, MD;  Location: [facility] OR;  Service: Orthopedics;  Laterality: Right;   WISDOM TOOTH EXTRACTION     hx of   Social History   Socioeconomic History   Marital status: Married    Spouse name: Not on file   Number of children: 5   Years of education: Not on file   Highest education level: Not on file  Occupational History   Occupation: Psychologist, forensic, now on disability due to back pain  Tobacco Use   Smoking status: Former    Current packs/day: 0.00    Average packs/day: 0.3 packs/day for 40.0 years (10.0 ttl pk-yrs)    Types: Cigarettes    Start date: 68    Quit date: 2008    Years since quitting: 17.9   Smokeless tobacco: Never   Tobacco comments:    02/18/2017 quit in 2012  Vaping Use   Vaping status: Never Used  Substance and Sexual Activity   Alcohol  use: No    Alcohol /week: 0.0 standard drinks of alcohol    Drug use: Yes    Comment: chronic opioids for chronic back pain   Sexual activity: Not Currently   Other Topics Concern   Not on file  Social History Narrative   Not on file   Social Drivers of Health   Tobacco Use: Medium Risk (01/06/2024)   Patient History    Smoking Tobacco Use: Former    Smokeless Tobacco Use: Never    Passive Exposure: Not on Actuary Strain: Not on File (06/13/2021)   Received from [employer]    Financial Resource Strain: 0  Food Insecurity: Low Risk (12/12/2022)   Received from [facility]   Epic    Within the past 12 months, you worried that your food would run out before you got money to  buy more: Never true    Within the past 12 months, the food you bought just didn't last and you didn't have money to get more. : Never true  Transportation Needs: No Transportation Needs (12/12/2022)   Received from [employer]    In the past 12 months, has lack of reliable transportation kept you from medical appointments, meetings, work or from getting things needed for daily living? : No  Physical Activity: Not on File (06/13/2021)   Received from [facility]   Physical Activity    Physical Activity: 0  Stress: Not on File (06/13/2021)   Received from [facility]   Stress    Stress: 0  Social Connections: Not on File (11/08/2022)   Received from [facility]   Social Connections    Connectedness: 0  Depression (PHQ2-9): Not on file  Alcohol  Screen: Not on file  Housing: Low Risk (12/12/2022)   Received from [facility]   Epic    What is your living situation today?: I have a steady place to live    Think about the place you live. Do you have problems with any of the following? Choose all that apply:: None/None on this list  Utilities: Low Risk (12/12/2022)   Received from [facility]   Utilities    In the past 12 months has the electric, gas, oil, or water  company threatened to shut off services in your home? : No  Health Literacy: Not on file   Family History  Problem Relation Age of Onset   Heart disease  Mother    Colon cancer Brother    Prostate cancer Brother    Rectal cancer Neg Hx    Esophageal cancer Neg Hx    Allergies[1] Prior to Admission medications  Medication Sig Start Date End Date Taking? Authorizing Provider  acetaminophen  (TYLENOL ) 500 MG tablet Take 500 mg by mouth daily as needed for moderate pain (pain score 4-6) or headache.    [provider]  ascorbic acid (VITAMIN C) 500 MG tablet Take 500 mg by mouth daily.    [provider]  aspirin  EC 81 MG tablet Take 81 mg by mouth daily. Swallow whole.    [provider]  atorvastatin  (LIPITOR ) 10 MG tablet Take 1 tablet (10 mg total) by mouth at bedtime. 02/11/23     atorvastatin  (LIPITOR ) 10 MG tablet Take 1 tablet (10 mg total) by mouth at bedtime. Patient not taking: Reported on 06/02/2023 05/21/23     atorvastatin  (LIPITOR ) 10 MG tablet Take 1 tablet (10 mg total) by mouth at bedtime. 08/25/23     atorvastatin  (LIPITOR ) 10 MG tablet Take 1 tablet (10 mg total) by mouth at bedtime. 12/17/23     bisacodyl  (DULCOLAX) 5 MG EC tablet Take by mouth as directed. Patient taking differently: Take 5 mg by mouth daily. 03/26/23     buprenorphine  (SUBUTEX ) 2 MG SUBL SL tablet Place 1/2 tablet under tongue two times daily, as needed, THIS IS TO REPLACE THE BELBUCA  FOR MORE ANALGESIA 08/05/23     buprenorphine  (SUBUTEX ) 2 MG SUBL SL tablet 1 tablet under tongue two times daily, as needed, THIS IS TO REPLACE THE BELBUCA  FOR MORE ANALGESIA 08/13/23     buprenorphine  (SUBUTEX ) 8 MG SUBL SL tablet Take 1 tablet under tongue two times daily, as needed, THIS IS TO REPLACE THE BELBUCA  FOR MORE ANALGESIA 09/14/23     buprenorphine  (SUBUTEX ) 8 MG SUBL SL tablet Take 1 tablet under tongue two  times daily, as needed, THIS IS TO REPLACE THE BELBUCA  FOR MORE ANALGESIA 09/14/23     buprenorphine  (SUBUTEX ) 8 MG SUBL SL tablet Place 1 tablet (8 mg total) under the tongue 3 (three) times daily as needed. 10/13/23      buprenorphine  (SUBUTEX ) 8 MG SUBL SL tablet Place 1 tablet (8 mg total) under the tongue 3 (three) times daily as needed. THIS IS TO REPLACE THE BELBUCA  FOR MORE ANALGESIA 12/15/23     buprenorphine  (SUBUTEX ) 8 MG SUBL SL tablet Place 1 tablet under tongue three times daily, as needed, THIS IS TO REPLACE THE BELBUCA  FOR MORE ANALGESIA 01/13/24     Buprenorphine  HCl (BELBUCA ) 900 MCG FILM Place 1 (one) Film inside cheeks two times daily 07/15/23     carvedilol  (COREG ) 12.5 MG tablet Take 1 tablet (12.5 mg total) by mouth 2 (two) times daily with a meal. 02/05/24   Jeffrie Oneil BROCKS, MD  chlorthalidone  (HYGROTON ) 25 MG tablet Take 1 tablet (25 mg total) by mouth daily. 02/05/24   Jeffrie Oneil BROCKS, MD  clotrimazole  (MYCELEX ) 10 MG troche Dissolve 1 troche in the oral cavity once weekly. 09/29/23     dapagliflozin  propanediol (FARXIGA ) 10 MG TABS tablet Take 1 tablet (10 mg total) by mouth daily. 08/25/23     dapagliflozin  propanediol (FARXIGA ) 10 MG TABS tablet Take 1 tablet (10 mg total) by mouth daily. 12/17/23     diclofenac  Sodium (VOLTAREN ) 1 % GEL Apply small amount to the affected area 3 times a day as needed for pain Patient taking differently: Apply 1 Application topically daily as needed (pain). 12/16/22     diclofenac  Sodium (VOLTAREN ) 1 % GEL Apply a small amount to affected area 2 times a day as needed for pain 02/05/24     diltiazem  (DILT-XR) 180 MG 24 hr capsule Take 1 capsule (180 mg total) by mouth daily. 05/21/23     diltiazem  (DILT-XR) 180 MG 24 hr capsule Take 1 capsule (180 mg total) by mouth daily. 08/25/23     diltiazem  (DILT-XR) 180 MG 24 hr capsule Take 1 capsule (180 mg total) by mouth daily. 12/14/23   Jeffrie Oneil BROCKS, MD  diltiazem  (DILT-XR) 180 MG 24 hr capsule Take 1 capsule (180 mg total) by mouth daily. 12/17/23     diltiazem  (TIAZAC ) 180 MG 24 hr capsule Take 1 capsule (180 mg total) by mouth daily. 11/23/23     docusate sodium  (COLACE) 100 MG capsule Take 1 capsule  (100 mg total) by mouth 2 (two) times daily. Patient taking differently: Take 100-200 mg by mouth See admin instructions. Take 200 mg by mouth in the morning and 100 mg by mouth at bedtime if needed for constipation. 02/10/19   Bergman, Hillard Goodwine D, NP  DULoxetine  (CYMBALTA ) 60 MG capsule Take 1 capsule (60 mg total) by mouth daily. 02/11/23     DULoxetine  (CYMBALTA ) 60 MG capsule Take 1 capsule (60 mg total) by mouth daily. 05/21/23     DULoxetine  (CYMBALTA ) 60 MG capsule Take 1 capsule (60 mg total) by mouth daily. 08/25/23     DULoxetine  (CYMBALTA ) 60 MG capsule Take 1 capsule (60 mg total) by mouth daily. 12/17/23     fluconazole  (DIFLUCAN ) 150 MG tablet Week 1: Take 1 tablet by mouth every other day. Weeks 2 through 4: Take 1 tablet by mouth once weekly 09/29/23     Fluticasone  Furoate (ARNUITY ELLIPTA ) 100 MCG/ACT AEPB Take 1 Inhalation by mouth every morning. 09/15/23  glipiZIDE  (GLUCOTROL  XL) 10 MG 24 hr tablet Take 2 tablets (20 mg total) by mouth daily with breakfast. Patient taking differently: Take 10 mg by mouth in the morning and at bedtime. 05/21/23     glipiZIDE  (GLUCOTROL  XL) 10 MG 24 hr tablet Take 1 tablet (10 mg total) by mouth 2 (two) times daily. 12/17/23     glucose blood ([employer] BLOOD GLUCOSE TEST) test strip Use to test blood sugar 4 times a day 01/28/23     glucose blood ([employer] BLOOD GLUCOSE TEST) test strip Use 1 Strip four times daily, accu-chek aviva plus Patient not taking: Reported on 06/02/2023 02/11/23     glucose blood test strip Use to test 4 times a day as directed Patient not taking: Reported on 06/02/2023 12/09/21     glucose blood test strip Use as directed 4 times daily 05/21/23     glucose blood test strip Use to test blood sugar 4 times a day 08/25/23     glucose blood test strip Use 1 strip 4 (four) times daily. 12/17/23     linaclotide  (LINZESS ) 145 MCG CAPS capsule Take 1 capsule (145 mcg total) by mouth daily. 11/12/22     linaclotide  (LINZESS ) 145 MCG CAPS capsule  Take 1 capsule (145 mcg total) by mouth daily. 05/21/23     linaclotide  (LINZESS ) 145 MCG CAPS capsule Take 1 capsule (145 mcg total) by mouth daily. 08/25/23     linaclotide  (LINZESS ) 145 MCG CAPS capsule Take 1 capsule (145 mcg total) by mouth daily. 12/17/23     lisinopril  (ZESTRIL ) 20 MG tablet Take 1 tablet (20 mg total) by mouth daily. Patient taking differently: Take 20 mg by mouth at bedtime. 02/11/23     lisinopril  (ZESTRIL ) 20 MG tablet Take 1 tablet (20 mg total) by mouth daily. 05/21/23     lisinopril  (ZESTRIL ) 20 MG tablet Take 1 tablet (20 mg total) by mouth daily. 08/25/23     lisinopril  (ZESTRIL ) 20 MG tablet Take 1 tablet (20 mg total) by mouth daily. 12/17/23     lubiprostone  (AMITIZA ) 24 MCG capsule Take 1 capsule (24 mcg total) by mouth every 12 (twelve) hours. 08/25/23     metFORMIN  (GLUCOPHAGE ) 1000 MG tablet Take 1 tablet (1,000 mg total) by mouth 2 (two) times daily. 02/11/23     metFORMIN  (GLUCOPHAGE ) 1000 MG tablet Take 1 tablet (1,000 mg total) by mouth 2 (two) times daily. Patient not taking: Reported on 06/02/2023 05/21/23     metFORMIN  (GLUCOPHAGE ) 1000 MG tablet Take 1 tablet (1,000 mg total) by mouth 2 (two) times daily. 08/25/23     metFORMIN  (GLUCOPHAGE ) 1000 MG tablet Take 1 tablet (1,000 mg total) by mouth 2 (two) times daily. 12/17/23     methocarbamol  (ROBAXIN ) 750 MG tablet Take 1 tablet (750 mg total) by mouth 2 (two) times daily as needed. Patient taking differently: Take 750 mg by mouth at bedtime. 11/12/22     methylPREDNISolone  (MEDROL ) 4 MG tablet Take 6 tablets by mouth on day 1; Take 5 tablets by mouth on day 2; Take 4 tablets by mouth on day 3; Take 3 tablets by mouth on day 4; Take 2 tablets by mouth on day 6 and Take 1 tablet by mouth on day 6. 10/13/23     Multiple Vitamin (MULTIVITAMIN WITH MINERALS) TABS tablet Take 1 tablet by mouth daily.    [provider]  mupirocin  ointment (BACTROBAN ) 2 % Apply to affected area twice a day. 07/17/23   Jerrye Charleston  CHRISTELLA Raddle., MD  naloxone  (NARCAN ) nasal spray 4 mg/0.1 mL Place 1 spray into the nose as needed. If found unresposive then spray this into nose and call 911 immediately 11/16/21     naloxone  (NARCAN ) nasal spray 4 mg/0.1 mL Use 1 (one)  spray as needed, IF FOUND UNRESPONSIVE THEN SPRAY THIS INTO NOSE AND CALL 911 IMMEDIATELY 04/16/23     omeprazole  (PRILOSEC) 40 MG capsule Take 1 capsule (40 mg total) by mouth daily. 12/22/22     omeprazole  (PRILOSEC) 40 MG capsule Take 1 capsule (40 mg total) by mouth daily. 05/21/23     omeprazole  (PRILOSEC) 40 MG capsule Take 1 capsule (40 mg total) by mouth daily. 08/25/23     omeprazole  (PRILOSEC) 40 MG capsule Take 1 capsule (40 mg total) by mouth daily. 12/17/23     Oxycodone  HCl 20 MG TABS Take 1 (one) Tablet by mouth every six hours, as needed 08/13/23     Oxycodone  HCl 20 MG TABS Take 1 tablet (20 mg total) by mouth every 6 (six) hours as needed 09/14/23     Oxycodone  HCl 20 MG TABS Take 1 tablet (20 mg total) by mouth every 6 (six) hours as needed. 01/13/24     polyethylene glycol powder (MIRALAX ) 17 GM/SCOOP powder Take as directed Patient taking differently: Take 17 g by mouth daily as needed for mild constipation or moderate constipation. 03/26/23     Potassium Chloride  ER 20 MEQ TBCR Take 1 tablet (20 mEq total) by mouth daily. 08/25/23     Potassium Chloride  ER 20 MEQ TBCR Take 1 tablet (20 mEq total) by mouth daily. 12/17/23     pregabalin  (LYRICA ) 150 MG capsule Take 1 capsule (150 mg total) by mouth 2 (two) times daily. 01/13/24     sildenafil  (VIAGRA ) 100 MG tablet Take 1 tablet (100 mg total) by mouth daily as needed. 05/07/21     sildenafil  (VIAGRA ) 100 MG tablet Take 1 tablet (100 mg total) by mouth once as needed for up to 1 dose. 07/17/23     sildenafil  (VIAGRA ) 100 MG tablet Take 1 tablet (100 mg total) by mouth daily as needed. 07/21/23   Jerome Heron Ruth, PA-C  tacrolimus  (PROGRAF ) 1 MG capsule Dissolve 1 capsule into a 0.5 liter bottle of  water . Swish and spit for 2 minutes as directed. Use twice daily as needed during flares of oral lichen planus. Keep refrigerated and dispose after 2 weeks. 09/29/23     tadalafil  (CIALIS ) 10 MG tablet Take 1 tablet (10 mg total) by mouth daily approximately 30 minutes before sexual activity as needed for sexual activity. Do not take more than 1 tablet per day. 08/25/23     tadalafil  (CIALIS ) 20 MG tablet 1 tab by mouth daily as needed for sexual activity; administer approximately before sexual activity; do not use more than 1 dose per 24hrs 12/17/23     Budesonide  (PULMICORT  FLEXHALER) 90 MCG/ACT inhaler Inhale 1 puff in the morning and 1 puff before bedtime. 09/15/23 09/15/23       Positive ROS: All other systems have been reviewed and were otherwise negative with the exception of those mentioned in the HPI and as above.  Physical Exam: General: Alert, no acute distress Cardiovascular: No pedal edema Respiratory: No cyanosis, no use of accessory musculature GI: No organomegaly, abdomen is soft and non-tender Skin: No lesions in the area of chief complaint Neurologic: Sensation intact distally Psychiatric: Patient is competent for consent with normal mood and affect Lymphatic:  No axillary or cervical lymphadenopathy  MUSCULOSKELETAL: TTP olecranon bursa, obvious edema present, limited ROM, NVI   Imaging: CT scan of right elbow consistent with olecranon bursitis   Assessment: RIGHT ELBOW LOOSE BODY, OLECRANON BURSITIS  Plan: Plan for Procedures: BURSECTOMY, ELBOW  The risks benefits and alternatives were discussed with the patient including but not limited to the risks of nonoperative treatment, versus surgical intervention including infection, bleeding, nerve injury,  blood clots, cardiopulmonary complications, morbidity, mortality, among others, and they were willing to proceed.   Weightbearing: NWB RUE x 1 week to let incision heal Orthopedic devices: sling Showering: POD  3 Dressing: reinforce PRN Medicines: Norco, Mobic , Zofran  (already on Oxy, Lyrica , and Buprenorphine )  Discharge: home Follow up: 02/26/24 at 10:45am with me    Gerard CHRISTELLA Large, PA-C Office [phone] 02/06/2024 3:34 PM      [1]  Allergies Allergen Reactions   Aquacel Extra Hydrofiber [Wound Dressings] Other (See Comments)    Blisters to the skin   Brilinta  [Ticagrelor ] Shortness Of Breath and Other (See Comments)    CHEST PAIN    Methylprednisolone  Other (See Comments)    DIAPHORESIS, HYPOTENSION   Prednisone  Other (See Comments)    DIAPHORESIS, HYPOTENSION   Toradol [Ketorolac Tromethamine] Other (See Comments)    DIAPHORESIS, HYPOTENSION     Adhesive [Tape] Rash and Other (See Comments)    blisters ( NO SURGICAL TAPE, PLEASE)

## 2024-02-08 ENCOUNTER — Other Ambulatory Visit: Payer: Self-pay

## 2024-02-08 ENCOUNTER — Other Ambulatory Visit (HOSPITAL_COMMUNITY): Payer: Self-pay

## 2024-02-09 ENCOUNTER — Other Ambulatory Visit: Payer: Self-pay

## 2024-02-09 ENCOUNTER — Encounter (HOSPITAL_BASED_OUTPATIENT_CLINIC_OR_DEPARTMENT_OTHER): Payer: Self-pay | Admitting: Orthopedic Surgery

## 2024-02-09 NOTE — Progress Notes (Signed)
°   02/09/24 1129  PAT Phone Screen  Is the patient taking a GLP-1 receptor agonist? No  Do You Have Diabetes? Yes  Do You Have Hypertension? Yes  Have You Ever Been to the ER for Asthma? No  Have You Taken Oral Steroids in the Past 3 Months? No  Do you Take Phenteramine or any Other Diet Drugs? No  Recent  Lab Work, EKG, CXR? Yes  Where was this test performed? 01-06-24 EKG  Do you have a history of heart problems? (S)  Yes (CAD +MI-stent [id number], ischemic cardiomyopathy)  Cardiologist Name Dr Jeffrie for CAD  Have you ever had tests on your heart? Yes  What cardiac tests were performed? Echo  What date/year were cardiac tests completed? 09-2018 ECHO EF 45-50%  Results viewable: [facility] Media Tab  Any Recent Hospitalizations? No  Height 5' 9 (1.753 m)  Weight 113.4 kg  Pat Appointment Scheduled (S)  Yes (BMP)

## 2024-02-11 ENCOUNTER — Other Ambulatory Visit (HOSPITAL_COMMUNITY): Payer: Self-pay

## 2024-02-11 MED ORDER — BUPRENORPHINE HCL 8 MG SL SUBL
8.0000 mg | SUBLINGUAL_TABLET | Freq: Three times a day (TID) | SUBLINGUAL | 0 refills | Status: AC | PRN
  Filled 2024-02-11: qty 90, 30d supply, fill #0

## 2024-02-11 MED ORDER — OXYCODONE HCL 20 MG PO TABS
20.0000 mg | ORAL_TABLET | Freq: Four times a day (QID) | ORAL | 0 refills | Status: AC | PRN
  Filled 2024-02-19: qty 120, 30d supply, fill #0

## 2024-02-11 MED ORDER — PREGABALIN 150 MG PO CAPS
150.0000 mg | ORAL_CAPSULE | Freq: Two times a day (BID) | ORAL | 0 refills | Status: DC
  Filled 2024-03-01: qty 60, 30d supply, fill #0

## 2024-02-12 ENCOUNTER — Encounter (HOSPITAL_BASED_OUTPATIENT_CLINIC_OR_DEPARTMENT_OTHER)
Admission: RE | Admit: 2024-02-12 | Discharge: 2024-02-12 | Disposition: A | Discharge status: Home / Self Care | Source: Ambulatory Visit | Attending: Orthopedic Surgery | Admitting: Orthopedic Surgery

## 2024-02-12 ENCOUNTER — Other Ambulatory Visit (HOSPITAL_COMMUNITY): Payer: Self-pay

## 2024-02-12 DIAGNOSIS — E118 Type 2 diabetes mellitus with unspecified complications: Secondary | ICD-10-CM | POA: Diagnosis not present

## 2024-02-12 DIAGNOSIS — Z01812 Encounter for preprocedural laboratory examination: Secondary | ICD-10-CM | POA: Insufficient documentation

## 2024-02-12 LAB — BASIC METABOLIC PANEL WITH GFR
Anion gap: 15 (ref 5–15)
BUN: 23 mg/dL (ref 8–23)
CO2: 23 mmol/L (ref 22–32)
Calcium: 9.7 mg/dL (ref 8.9–10.3)
Chloride: 100 mmol/L (ref 98–111)
Creatinine, Ser: 1.04 mg/dL (ref 0.61–1.24)
GFR, Estimated: >60 mL/min
Glucose, Bld: 113 mg/dL — ABNORMAL HIGH (ref 70–99)
Potassium: 4.5 mmol/L (ref 3.5–5.1)
Sodium: 138 mmol/L (ref 135–145)

## 2024-02-12 NOTE — Progress Notes (Signed)

## 2024-02-16 ENCOUNTER — Ambulatory Visit (HOSPITAL_BASED_OUTPATIENT_CLINIC_OR_DEPARTMENT_OTHER)

## 2024-02-16 ENCOUNTER — Encounter (HOSPITAL_BASED_OUTPATIENT_CLINIC_OR_DEPARTMENT_OTHER): Payer: Self-pay | Admitting: Orthopedic Surgery

## 2024-02-16 ENCOUNTER — Encounter (HOSPITAL_BASED_OUTPATIENT_CLINIC_OR_DEPARTMENT_OTHER): Admission: RE | Disposition: A | Payer: Self-pay | Attending: Orthopedic Surgery

## 2024-02-16 ENCOUNTER — Other Ambulatory Visit (HOSPITAL_COMMUNITY): Payer: Self-pay

## 2024-02-16 ENCOUNTER — Ambulatory Visit (HOSPITAL_BASED_OUTPATIENT_CLINIC_OR_DEPARTMENT_OTHER)
Admission: RE | Admit: 2024-02-16 | Discharge: 2024-02-16 | Disposition: A | Discharge status: Home / Self Care | Attending: Orthopedic Surgery | Admitting: Orthopedic Surgery

## 2024-02-16 ENCOUNTER — Other Ambulatory Visit: Payer: Self-pay

## 2024-02-16 DIAGNOSIS — I252 Old myocardial infarction: Secondary | ICD-10-CM | POA: Insufficient documentation

## 2024-02-16 DIAGNOSIS — S46311A Strain of muscle, fascia and tendon of triceps, right arm, initial encounter: Secondary | ICD-10-CM | POA: Insufficient documentation

## 2024-02-16 DIAGNOSIS — I5042 Chronic combined systolic (congestive) and diastolic (congestive) heart failure: Secondary | ICD-10-CM | POA: Diagnosis not present

## 2024-02-16 DIAGNOSIS — Z87891 Personal history of nicotine dependence: Secondary | ICD-10-CM

## 2024-02-16 DIAGNOSIS — E1151 Type 2 diabetes mellitus with diabetic peripheral angiopathy without gangrene: Secondary | ICD-10-CM | POA: Insufficient documentation

## 2024-02-16 DIAGNOSIS — M24021 Loose body in right elbow: Secondary | ICD-10-CM | POA: Diagnosis present

## 2024-02-16 DIAGNOSIS — X58XXXA Exposure to other specified factors, initial encounter: Secondary | ICD-10-CM | POA: Diagnosis not present

## 2024-02-16 DIAGNOSIS — I2510 Atherosclerotic heart disease of native coronary artery without angina pectoris: Secondary | ICD-10-CM | POA: Diagnosis not present

## 2024-02-16 DIAGNOSIS — M7021 Olecranon bursitis, right elbow: Secondary | ICD-10-CM | POA: Insufficient documentation

## 2024-02-16 DIAGNOSIS — I110 Hypertensive heart disease with heart failure: Secondary | ICD-10-CM | POA: Insufficient documentation

## 2024-02-16 DIAGNOSIS — I10 Essential (primary) hypertension: Secondary | ICD-10-CM | POA: Diagnosis not present

## 2024-02-16 HISTORY — PX: OLECRANON BURSECTOMY: SHX2097

## 2024-02-16 HISTORY — PX: TRICEPS TENDON REPAIR: SHX2577

## 2024-02-16 LAB — GLUCOSE, CAPILLARY
Glucose-Capillary: 67 mg/dL — ABNORMAL LOW (ref 70–99)
Glucose-Capillary: 122 mg/dL — ABNORMAL HIGH (ref 70–99)

## 2024-02-16 SURGERY — BURSECTOMY, ELBOW
Anesthesia: Monitor Anesthesia Care | Site: Elbow | Laterality: Right

## 2024-02-16 MED ORDER — MIDAZOLAM HCL 2 MG/2ML IJ SOLN
INTRAMUSCULAR | Status: AC
  Filled 2024-02-16: qty 2

## 2024-02-16 MED ORDER — FENTANYL CITRATE (PF) 100 MCG/2ML IJ SOLN
INTRAMUSCULAR | Status: DC | PRN
  Administered 2024-02-16: 25 ug via INTRAVENOUS

## 2024-02-16 MED ORDER — OXYCODONE HCL 5 MG PO TABS: 5.0000 mg | ORAL_TABLET | Freq: Once | ORAL | Status: DC | PRN

## 2024-02-16 MED ORDER — ONDANSETRON HCL 4 MG/2ML IJ SOLN
INTRAMUSCULAR | Status: DC | PRN
  Administered 2024-02-16: 4 mg via INTRAVENOUS

## 2024-02-16 MED ORDER — ACETAMINOPHEN 500 MG PO TABS
1000.0000 mg | ORAL_TABLET | Freq: Once | ORAL | Status: AC
  Administered 2024-02-16: 1000 mg via ORAL

## 2024-02-16 MED ORDER — PROPOFOL 500 MG/50ML IV EMUL
INTRAVENOUS | Status: DC | PRN
  Administered 2024-02-16: 20 mg via INTRAVENOUS
  Administered 2024-02-16: 100 ug/kg/min via INTRAVENOUS

## 2024-02-16 MED ORDER — CEFAZOLIN SODIUM-DEXTROSE 2-4 GM/100ML-% IV SOLN
2.0000 g | INTRAVENOUS | Status: AC
  Administered 2024-02-16: 2 g via INTRAVENOUS

## 2024-02-16 MED ORDER — DEXTROSE 50 % IV SOLN
INTRAVENOUS | Status: DC | PRN
  Administered 2024-02-16: 25 mL via INTRAVENOUS

## 2024-02-16 MED ORDER — PHENYLEPHRINE 80 MCG/ML (10ML) SYRINGE FOR IV PUSH (FOR BLOOD PRESSURE SUPPORT)
PREFILLED_SYRINGE | INTRAVENOUS | Status: AC
  Filled 2024-02-16: qty 10

## 2024-02-16 MED ORDER — HYDROCODONE-ACETAMINOPHEN 10-325 MG PO TABS
1.0000 | ORAL_TABLET | Freq: Three times a day (TID) | ORAL | 0 refills | Status: AC | PRN
  Filled 2024-02-16: qty 21, 7d supply, fill #0

## 2024-02-16 MED ORDER — ACETAMINOPHEN 10 MG/ML IV SOLN: 1000.0000 mg | Freq: Once | INTRAVENOUS | Status: DC | PRN

## 2024-02-16 MED ORDER — FENTANYL CITRATE (PF) 100 MCG/2ML IJ SOLN
INTRAMUSCULAR | Status: AC
  Filled 2024-02-16: qty 2

## 2024-02-16 MED ORDER — OXYCODONE HCL 5 MG/5ML PO SOLN: 5.0000 mg | Freq: Once | ORAL | Status: DC | PRN

## 2024-02-16 MED ORDER — PROPOFOL 500 MG/50ML IV EMUL
INTRAVENOUS | Status: AC
  Filled 2024-02-16: qty 50

## 2024-02-16 MED ORDER — ALBUTEROL SULFATE (2.5 MG/3ML) 0.083% IN NEBU
INHALATION_SOLUTION | RESPIRATORY_TRACT | Status: AC
  Filled 2024-02-16: qty 3

## 2024-02-16 MED ORDER — VANCOMYCIN HCL 500 MG IV SOLR
INTRAVENOUS | Status: DC | PRN
  Administered 2024-02-16: 500 mg via TOPICAL

## 2024-02-16 MED ORDER — ONDANSETRON 4 MG PO TBDP
4.0000 mg | ORAL_TABLET | Freq: Three times a day (TID) | ORAL | 0 refills | Status: AC | PRN
  Filled 2024-02-16: qty 15, 5d supply, fill #0

## 2024-02-16 MED ORDER — DEXTROSE 50 % IV SOLN
INTRAVENOUS | Status: AC
  Filled 2024-02-16: qty 50

## 2024-02-16 MED ORDER — CEFAZOLIN SODIUM-DEXTROSE 2-4 GM/100ML-% IV SOLN
INTRAVENOUS | Status: AC
  Filled 2024-02-16: qty 100

## 2024-02-16 MED ORDER — FENTANYL CITRATE (PF) 100 MCG/2ML IJ SOLN: 25.0000 ug | INTRAMUSCULAR | Status: DC | PRN

## 2024-02-16 MED ORDER — ROPIVACAINE HCL 5 MG/ML IJ SOLN
INTRAMUSCULAR | Status: DC | PRN
  Administered 2024-02-16: 25 mL via PERINEURAL

## 2024-02-16 MED ORDER — ALBUTEROL SULFATE (2.5 MG/3ML) 0.083% IN NEBU
2.5000 mg | INHALATION_SOLUTION | Freq: Four times a day (QID) | RESPIRATORY_TRACT | Status: DC | PRN
  Administered 2024-02-16: 2.5 mg via RESPIRATORY_TRACT

## 2024-02-16 MED ORDER — ONDANSETRON HCL 4 MG/2ML IJ SOLN
INTRAMUSCULAR | Status: AC
  Filled 2024-02-16: qty 2

## 2024-02-16 MED ORDER — FENTANYL CITRATE (PF) 100 MCG/2ML IJ SOLN
50.0000 ug | Freq: Once | INTRAMUSCULAR | Status: AC
  Administered 2024-02-16: 50 ug via INTRAVENOUS

## 2024-02-16 MED ORDER — POVIDONE-IODINE 10 % EX SWAB
2.0000 | Freq: Once | CUTANEOUS | Status: AC
  Administered 2024-02-16: 2 via TOPICAL

## 2024-02-16 MED ORDER — ACETAMINOPHEN 500 MG PO TABS
ORAL_TABLET | ORAL | Status: AC
  Filled 2024-02-16: qty 2

## 2024-02-16 MED ORDER — ASPIRIN 81 MG PO TBEC
81.0000 mg | DELAYED_RELEASE_TABLET | Freq: Two times a day (BID) | ORAL | 0 refills | Status: AC
  Filled 2024-02-16: qty 60, 30d supply, fill #0

## 2024-02-16 MED ORDER — VANCOMYCIN HCL 500 MG IV SOLR
INTRAVENOUS | Status: AC
  Filled 2024-02-16: qty 10

## 2024-02-16 MED ORDER — MELOXICAM 15 MG PO TABS
15.0000 mg | ORAL_TABLET | Freq: Every day | ORAL | 0 refills | Status: AC | PRN
  Filled 2024-02-16: qty 30, 30d supply, fill #0

## 2024-02-16 MED ORDER — DROPERIDOL 2.5 MG/ML IJ SOLN: 0.6250 mg | Freq: Once | INTRAMUSCULAR | Status: DC | PRN

## 2024-02-16 MED ORDER — BUPIVACAINE HCL (PF) 0.5 % IJ SOLN
INTRAMUSCULAR | Status: AC
  Filled 2024-02-16: qty 30

## 2024-02-16 MED ORDER — PHENYLEPHRINE HCL (PRESSORS) 10 MG/ML IV SOLN
INTRAVENOUS | Status: DC | PRN
  Administered 2024-02-16: 80 ug via INTRAVENOUS

## 2024-02-16 MED ORDER — LIDOCAINE 2% (20 MG/ML) 5 ML SYRINGE
INTRAMUSCULAR | Status: AC
  Filled 2024-02-16: qty 5

## 2024-02-16 MED ORDER — LACTATED RINGERS IV SOLN: INTRAVENOUS | Status: DC

## 2024-02-16 MED ORDER — 0.9 % SODIUM CHLORIDE (POUR BTL) OPTIME
TOPICAL | Status: DC | PRN
  Administered 2024-02-16: 250 mL

## 2024-02-16 SURGICAL SUPPLY — 52 items
ANCHOR SUT 1.8 FIBERTAK SB KL (Anchor) IMPLANT
BLADE SURG 15 STRL LF DISP TIS (BLADE) ×2 IMPLANT
BNDG COHESIVE 4X5 TAN STRL LF (GAUZE/BANDAGES/DRESSINGS) ×1 IMPLANT
BNDG ELASTIC 4INX 5YD STR LF (GAUZE/BANDAGES/DRESSINGS) ×1 IMPLANT
CHLORAPREP W/TINT 26 (MISCELLANEOUS) ×1 IMPLANT
CLSR STERI-STRIP ANTIMIC 1/2X4 (GAUZE/BANDAGES/DRESSINGS) IMPLANT
COVER BACK TABLE 60X90IN (DRAPES) ×1 IMPLANT
CUFF TOURN SGL QUICK 18 NS (TOURNIQUET CUFF) ×1 IMPLANT
DRAIN PENROSE .5X12 LATEX STL (DRAIN) IMPLANT
DRAPE EXTREMITY T 121X128X90 (DISPOSABLE) ×1 IMPLANT
DRAPE IMP U-DRAPE 54X76 (DRAPES) ×1 IMPLANT
DRAPE INCISE IOBAN 66X45 STRL (DRAPES) IMPLANT
DRAPE U-SHAPE 47X51 STRL (DRAPES) ×1 IMPLANT
DRSG EMULSION OIL 3X3 NADH (GAUZE/BANDAGES/DRESSINGS) ×1 IMPLANT
ELECTRODE REM PT RTRN 9FT ADLT (ELECTROSURGICAL) ×1 IMPLANT
GAUZE PAD ABD 8X10 STRL (GAUZE/BANDAGES/DRESSINGS) ×1 IMPLANT
GAUZE SPONGE 4X4 12PLY STRL (GAUZE/BANDAGES/DRESSINGS) ×1 IMPLANT
GLOVE BIO SURGEON STRL SZ7.5 (GLOVE) ×1 IMPLANT
GLOVE BIOGEL PI IND STRL 7.0 (GLOVE) ×1 IMPLANT
GLOVE BIOGEL PI IND STRL 8 (GLOVE) ×1 IMPLANT
GLOVE SURG SYN 7.0 PF PI (GLOVE) ×1 IMPLANT
GOWN STRL REUS W/ TWL LRG LVL3 (GOWN DISPOSABLE) ×2 IMPLANT
GOWN STRL REUS W/ TWL XL LVL3 (GOWN DISPOSABLE) ×1 IMPLANT
KIT STR SPEAR 1.8 FBRTK DISP (KITS) IMPLANT
MANIFOLD NEPTUNE II (INSTRUMENTS) IMPLANT
NDL HYPO 22X1.5 SAFETY MO (MISCELLANEOUS) IMPLANT
NEEDLE HYPO 22X1.5 SAFETY MO (MISCELLANEOUS) IMPLANT
PACK BASIN DAY SURGERY FS (CUSTOM PROCEDURE TRAY) ×1 IMPLANT
PAD CAST 4YDX4 CTTN HI CHSV (CAST SUPPLIES) ×1 IMPLANT
PENCIL SMOKE EVACUATOR (MISCELLANEOUS) ×1 IMPLANT
SET IRRIG Y-TYPE CYSTO (SET/KITS/TRAYS/PACK) IMPLANT
SLEEVE SCD COMPRESS KNEE MED (STOCKING) IMPLANT
SLING ARM FOAM STRAP LRG (SOFTGOODS) IMPLANT
SOLN 0.9% NACL POUR BTL 1000ML (IV SOLUTION) ×1 IMPLANT
SPIKE FLUID TRANSFER (MISCELLANEOUS) IMPLANT
SPONGE T-LAP 18X18 ~~LOC~~+RFID (SPONGE) ×1 IMPLANT
SPONGE T-LAP 4X18 ~~LOC~~+RFID (SPONGE) IMPLANT
STOCKINETTE IMPERVIOUS LG (DRAPES) ×1 IMPLANT
SUCTION TUBE FRAZIER 10FR DISP (SUCTIONS) IMPLANT
SUT ETHILON 3 0 PS 1 (SUTURE) IMPLANT
SUT MON AB 2-0 CT1 36 (SUTURE) IMPLANT
SUT MON AB 4-0 PC3 18 (SUTURE) IMPLANT
SUT VIC AB 2-0 SH 27XBRD (SUTURE) IMPLANT
SUT VICRYL 0 SH 27 (SUTURE) IMPLANT
SWAB COLLECTION DEVICE MRSA (MISCELLANEOUS) IMPLANT
SWAB CULTURE ESWAB REG 1ML (MISCELLANEOUS) IMPLANT
SYR BULB EAR ULCER 3OZ GRN STR (SYRINGE) ×1 IMPLANT
SYR CONTROL 10ML LL (SYRINGE) IMPLANT
TOWEL GREEN STERILE FF (TOWEL DISPOSABLE) ×2 IMPLANT
TUBE CONNECTING 20X1/4 (TUBING) ×1 IMPLANT
UNDERPAD 30X36 HEAVY ABSORB (UNDERPADS AND DIAPERS) ×1 IMPLANT
YANKAUER SUCT BULB TIP NO VENT (SUCTIONS) ×1 IMPLANT

## 2024-02-16 NOTE — Progress Notes (Signed)
 Assisted Dr. Thom Fitzpatrick with right, supraclavicular, ultrasound guided block. Side rails up, monitors on throughout procedure. See vital signs in flow sheet. Tolerated Procedure well.

## 2024-02-16 NOTE — Discharge Instructions (Addendum)
 No Tylenol  before 1pm today.  POST-OPERATIVE OPIOID TAPER INSTRUCTIONS: It is important to wean off of your opioid medication as soon as possible. If you do not need pain medication after your surgery it is ok to stop day one. Opioids include: Codeine, Hydrocodone (Norco, Vicodin), Oxycodone (Percocet, oxycontin ) and hydromorphone  amongst others.  Long term and even short term use of opiods can cause: Increased pain response Dependence Constipation Depression Respiratory depression And more.  Withdrawal symptoms can include Flu like symptoms Nausea, vomiting And more Techniques to manage these symptoms Hydrate well Eat regular healthy meals Stay active Use relaxation techniques(deep breathing, meditating, yoga) Do Not substitute Alcohol  to help with tapering If you have been on opioids for less than two weeks and do not have pain than it is ok to stop all together.  Plan to wean off of opioids This plan should start within one week post op of your joint replacement. Maintain the same interval or time between taking each dose and first decrease the dose.  Cut the total daily intake of opioids by one tablet each day Next start to increase the time between doses. The last dose that should be eliminated is the evening dose.    Post Anesthesia Home Care Instructions  Activity: Get plenty of rest for the remainder of the day. A responsible individual must stay with you for 24 hours following the procedure.  For the next 24 hours, DO NOT: -Drive a car -Advertising copywriter -Drink alcoholic beverages -Take any medication unless instructed by your physician -Make any legal decisions or sign important papers.  Meals: Start with liquid foods such as gelatin or soup. Progress to regular foods as tolerated. Avoid greasy, spicy, heavy foods. If nausea and/or vomiting occur, drink only clear liquids until the nausea and/or vomiting subsides. Call your physician if vomiting  continues.  Special Instructions/Symptoms: Your throat may feel dry or sore from the anesthesia or the breathing tube placed in your throat during surgery. If this causes discomfort, gargle with warm salt water . The discomfort should disappear within 24 hours.  If you had a scopolamine  patch placed behind your ear for the management of post- operative nausea and/or vomiting:  1. The medication in the patch is effective for 72 hours, after which it should be removed.  Wrap patch in a tissue and discard in the trash. Wash hands thoroughly with soap and water . 2. You may remove the patch earlier than 72 hours if you experience unpleasant side effects which may include dry mouth, dizziness or visual disturbances. 3. Avoid touching the patch. Wash your hands with soap and water  after contact with the patch.     Regional Anesthesia Blocks  1. You may not be able to move or feel the blocked extremity after a regional anesthetic block. This may last may last from 3-48 hours after placement, but it will go away. The length of time depends on the medication injected and your individual response to the medication. As the nerves start to wake up, you may experience tingling as the movement and feeling returns to your extremity. If the numbness and inability to move your extremity has not gone away after 48 hours, please call your surgeon.   2. The extremity that is blocked will need to be protected until the numbness is gone and the strength has returned. Because you cannot feel it, you will need to take extra care to avoid injury. Because it may be weak, you may have difficulty moving it or using it. You  may not know what position it is in without looking at it while the block is in effect.  3. For blocks in the legs and feet, returning to weight bearing and walking needs to be done carefully. You will need to wait until the numbness is entirely gone and the strength has returned. You should be able to move  your leg and foot normally before you try and bear weight or walk. You will need someone to be with you when you first try to ensure you do not fall and possibly risk injury.  4. Bruising and tenderness at the needle site are common side effects and will resolve in a few days.  5. Persistent numbness or new problems with movement should be communicated to the surgeon or the [facility] [phone] [facility] [phone]).

## 2024-02-16 NOTE — Anesthesia Postprocedure Evaluation (Signed)
"   Anesthesia Post Note  Patient: Thomas Mcclure  Procedure(s) Performed: BURSECTOMY, ELBOW REMOVAL OF LOOSE BODY (Right: Elbow) REPAIR, TENDON, TRICEPS (Right: Elbow)     Patient location during evaluation: PACU Anesthesia Type: Regional and MAC Level of consciousness: awake and alert Pain management: pain level controlled Vital Signs Assessment: post-procedure vital signs reviewed and stable Respiratory status: spontaneous breathing, nonlabored ventilation, respiratory function stable and patient connected to nasal cannula oxygen Cardiovascular status: stable and blood pressure returned to baseline Postop Assessment: no apparent nausea or vomiting Anesthetic complications: no   No notable events documented.  Last Vitals:  Vitals:   02/16/24 0945 02/16/24 1037  BP: 121/76 (!) 139/97  Pulse: 70 64  Resp: 12 20  Temp:  (!) 36.3 C  SpO2: 99% 94%    Last Pain:  Vitals:   02/16/24 1037  TempSrc: Temporal  PainSc: 0-No pain                 Thom JONELLE Peoples      "

## 2024-02-16 NOTE — Anesthesia Procedure Notes (Signed)
 Anesthesia Regional Block: Supraclavicular block   Pre-Anesthetic Checklist: , timeout performed,  Correct Patient, Correct Site, Correct Laterality,  Correct Procedure, Correct Position, site marked,  Risks and benefits discussed,  Surgical consent,  Pre-op evaluation,  At surgeon's request and post-op pain management  Laterality: Right  Prep: chloraprep       Needles:  Injection technique: Single-shot  Needle Type: Echogenic Stimulator Needle     Needle Length: 9cm  Needle Gauge: 21     Additional Needles:   Procedures:,,,, ultrasound used (permanent image in chart),,    Narrative:  Start time: 02/16/2024 7:05 AM End time: 02/16/2024 7:15 AM Injection made incrementally with aspirations every 5 mL.  Performed by: Personally  Anesthesiologist: Erma Thom SAUNDERS, MD  Additional Notes: Discussed risks and benefits of the nerve block in detail, including but not limited vascular injury, permanent nerve damage and infection.   Patient tolerated the procedure well. Local anesthetic introduced in an incremental fashion under minimal resistance after negative aspirations. No paresthesias were elicited. After completion of the procedure, no acute issues were identified and patient continued to be monitored by RN.

## 2024-02-16 NOTE — Transfer of Care (Signed)
 Immediate Anesthesia Transfer of Care Note  Patient: Thomas Mcclure  Procedure(s) Performed: BURSECTOMY, ELBOW REMOVAL OF LOOSE BODY (Right: Elbow) REPAIR, TENDON, TRICEPS (Right: Elbow)  Patient Location: PACU  Anesthesia Type:MAC combined with regional for post-op pain  Level of Consciousness: drowsy  Airway & Oxygen Therapy: Patient Spontanous Breathing and Patient connected to face mask oxygen  Post-op Assessment: Report given to RN and Post -op Vital signs reviewed and stable  Post vital signs: Reviewed and stable  Last Vitals:  Vitals Value Taken Time  BP 115/83 02/16/24 08:43  Temp    Pulse 79 02/16/24 08:44  Resp 15 02/16/24 08:44  SpO2 97 % 02/16/24 08:44  Vitals shown include unfiled device data.  Last Pain:  Vitals:   02/16/24 0648  PainSc: 3       Patients Stated Pain Goal: 4 (02/16/24 9351)  Complications: No notable events documented.

## 2024-02-16 NOTE — Anesthesia Preprocedure Evaluation (Addendum)
"                                    Anesthesia Evaluation  Patient identified by MRN, date of birth, ID band Patient awake    Reviewed: Allergy & Precautions, H&P , NPO status , Patient's Chart, lab work & pertinent test results  History of Anesthesia Complications Negative for: history of anesthetic complications  Airway Mallampati: II  TM Distance: >3 FB Neck ROM: Full    Dental  (+) Lower Dentures, Upper Dentures   Pulmonary neg sleep apnea, former smoker   Pulmonary exam normal breath sounds clear to auscultation       Cardiovascular hypertension, (-) angina + CAD, + Past MI, + Cardiac Stents and + Peripheral Vascular Disease  Normal cardiovascular exam Rhythm:Regular Rate:Normal  2020: IMPRESSIONS     1. The left ventricle has mildly reduced systolic function, with an  ejection fraction of 45-50%. The cavity size was normal. There is mildly  increased left ventricular wall thickness. Left ventricular diastolic  Doppler parameters are consistent with  impaired relaxation.   2. Abnormal septal motion inferior basal hypokinesis.   3. The right ventricle has normal systolic function. The cavity was  normal. There is no increase in right ventricular wall thickness.   4. Mild thickening of the mitral valve leaflet. Mild calcification of the  mitral valve leaflet. There is mild mitral annular calcification present.   5. The aortic valve is tricuspid. Mild thickening of the aortic valve.  Sclerosis without any evidence of stenosis of the aortic valve.   6. The aorta is normal unless otherwise noted.   7. The interatrial septum was not well visualized.     Neuro/Psych neg Seizures Spinal cord stimulator  Neuromuscular disease  negative psych ROS   GI/Hepatic Neg liver ROS,GERD  ,,  Endo/Other  diabetes, Type 2    Renal/GU negative Renal ROS  negative genitourinary   Musculoskeletal  (+) Arthritis ,    Abdominal   Peds negative pediatric ROS (+)   Hematology negative hematology ROS (+)   Anesthesia Other Findings   Reproductive/Obstetrics negative OB ROS                              Anesthesia Physical Anesthesia Plan  ASA: 3  Anesthesia Plan: MAC and Regional   Post-op Pain Management: Tylenol  PO (pre-op)* and Regional block*   Induction: Intravenous  PONV Risk Score and Plan: 1 and Propofol  infusion and Treatment may vary due to age or medical condition  Airway Management Planned: Natural Airway  Additional Equipment:   Intra-op Plan:   Post-operative Plan:   Informed Consent: I have reviewed the patients History and Physical, chart, labs and discussed the procedure including the risks, benefits and alternatives for the proposed anesthesia with the patient or authorized representative who has indicated his/her understanding and acceptance.     Dental advisory given  Plan Discussed with: CRNA  Anesthesia Plan Comments:          Anesthesia Quick Evaluation  "

## 2024-02-16 NOTE — Interval H&P Note (Signed)
 History and Physical Interval Note:  02/16/2024 7:03 AM  Thomas Mcclure  has presented today for surgery, with the diagnosis of RIGHT ELBOW LOOSE BODY, OLECRANON BURSITIS.  The various methods of treatment have been discussed with the patient and family. After consideration of risks, benefits and other options for treatment, the patient has consented to  Procedures: BURSECTOMY, ELBOW (Right) as a surgical intervention.  The patient's history has been reviewed, patient examined, no change in status, stable for surgery.  I have reviewed the patient's chart and labs.  Questions were answered to the patient's satisfaction.     Thomas Mcclure

## 2024-02-16 NOTE — Op Note (Signed)
 02/16/2024  8:23 AM  PATIENT:  Thomas Mcclure    PRE-OPERATIVE DIAGNOSIS:  RIGHT ELBOW LOOSE BODY, OLECRANON BURSITIS  POST-OPERATIVE DIAGNOSIS:  Same partial triceps rupture  PROCEDURE:  BURSECTOMY, ELBOW REMOVAL OF LOOSE BODY, REPAIR, TENDON, TRICEPS  SURGEON:  Evalene JONETTA Chancy, MD  ASSISTANT: Gerard Large, PA-C, he was present and scrubbed throughout the case, critical for completion in a timely fashion, and for retraction, instrumentation, and closure.   ANESTHESIA:   block/mask  PREOPERATIVE INDICATIONS:  Thomas Mcclure is a  68 y.o. male with a diagnosis of RIGHT ELBOW LOOSE BODY, OLECRANON BURSITIS who failed conservative measures and elected for surgical management.    The risks benefits and alternatives were discussed with the patient preoperatively including but not limited to the risks of infection, bleeding, nerve injury, cardiopulmonary complications, the need for revision surgery, among others, and the patient was willing to proceed.  OPERATIVE IMPLANTS: Arthrex knotless fiber tack  OPERATIVE FINDINGS: Significant bursitis loose body in triceps tendon with partial triceps tear  BLOOD LOSS: 10 cc  COMPLICATIONS: None  TOURNIQUET TIME: 30 minutes  OPERATIVE PROCEDURE:  Patient was identified in the preoperative holding area and site was marked by me He was transported to the operating theater and placed on the table in supine position taking care to pad all bony prominences. After a preincinduction time out anesthesia was induced. The right upper extremity was prepped and draped in normal sterile fashion and a pre-incision timeout was performed. He received Ancef  for preoperative antibiotics.   Made an incision over his dorsal olecranon and incised to the olecranon bursa I incised this longitudinally and then used sharp dissection to remove bursal tissue as well as a rondure protected all neurovascular structures throughout  After complete bursectomy identified the  loose body enveloped within the triceps tendon  He had a partial tear of the triceps tendon through this I was able to remove the loose ossified piece  Next I placed a knotless fiber tack in the olecranon and sutured the split in the triceps tendon and also secured this for backup into the olecranon repairing the triceps tendon  Next I performed a complex closure of his 4 cm laceration to eliminate dead space and try to avoid bursal fluid collection  POST OPERATIVE PLAN: Sling full-time

## 2024-02-17 ENCOUNTER — Other Ambulatory Visit (HOSPITAL_COMMUNITY): Payer: Self-pay

## 2024-02-17 ENCOUNTER — Encounter (HOSPITAL_BASED_OUTPATIENT_CLINIC_OR_DEPARTMENT_OTHER): Payer: Self-pay | Admitting: Orthopedic Surgery

## 2024-02-18 ENCOUNTER — Other Ambulatory Visit (HOSPITAL_COMMUNITY): Payer: Self-pay

## 2024-02-19 ENCOUNTER — Other Ambulatory Visit (HOSPITAL_COMMUNITY): Payer: Self-pay

## 2024-02-19 MED ORDER — LUBIPROSTONE 24 MCG PO CAPS
24.0000 ug | ORAL_CAPSULE | Freq: Two times a day (BID) | ORAL | 0 refills | Status: AC
  Filled 2024-02-22 – 2024-03-05 (×2): qty 180, 90d supply, fill #0

## 2024-02-22 ENCOUNTER — Other Ambulatory Visit (HOSPITAL_COMMUNITY): Payer: Self-pay

## 2024-03-01 ENCOUNTER — Other Ambulatory Visit (HOSPITAL_COMMUNITY): Payer: Self-pay

## 2024-03-01 ENCOUNTER — Other Ambulatory Visit: Payer: Self-pay

## 2024-03-06 ENCOUNTER — Other Ambulatory Visit: Payer: Self-pay

## 2024-03-06 ENCOUNTER — Other Ambulatory Visit (HOSPITAL_COMMUNITY): Payer: Self-pay

## 2024-03-14 ENCOUNTER — Other Ambulatory Visit (HOSPITAL_COMMUNITY): Payer: Self-pay

## 2024-03-14 MED ORDER — OXYCODONE HCL 20 MG PO TABS
20.0000 mg | ORAL_TABLET | Freq: Four times a day (QID) | ORAL | 0 refills | Status: AC
  Filled 2024-03-14 – 2024-03-19 (×2): qty 120, 30d supply, fill #0

## 2024-03-14 MED ORDER — BUPRENORPHINE HCL 8 MG SL SUBL
8.0000 mg | SUBLINGUAL_TABLET | Freq: Three times a day (TID) | SUBLINGUAL | 0 refills | Status: AC
  Filled 2024-03-14: qty 90, 30d supply, fill #0

## 2024-03-14 MED ORDER — PREGABALIN 150 MG PO CAPS
150.0000 mg | ORAL_CAPSULE | Freq: Two times a day (BID) | ORAL | 0 refills | Status: AC
  Filled 2024-03-14: qty 60, 30d supply, fill #0

## 2024-03-16 ENCOUNTER — Other Ambulatory Visit (HOSPITAL_COMMUNITY): Payer: Self-pay

## 2024-03-19 ENCOUNTER — Other Ambulatory Visit (HOSPITAL_COMMUNITY): Payer: Self-pay

## 2024-03-30 ENCOUNTER — Other Ambulatory Visit: Payer: Self-pay

## 2024-03-30 ENCOUNTER — Other Ambulatory Visit (HOSPITAL_COMMUNITY): Payer: Self-pay
